# Patient Record
Sex: Male | Born: 1960 | Race: White | Hispanic: No | Marital: Single | Smoking: Never smoker
Health system: Southern US, Community
[De-identification: ages and names within clinical notes are randomized; demographics above are authoritative.]

## PROBLEM LIST (undated history)

## (undated) DIAGNOSIS — M1990 Unspecified osteoarthritis, unspecified site: Secondary | ICD-10-CM

## (undated) DIAGNOSIS — I82409 Acute embolism and thrombosis of unspecified deep veins of unspecified lower extremity: Secondary | ICD-10-CM

## (undated) DIAGNOSIS — I2699 Other pulmonary embolism without acute cor pulmonale: Secondary | ICD-10-CM

## (undated) DIAGNOSIS — L97409 Non-pressure chronic ulcer of unspecified heel and midfoot with unspecified severity: Secondary | ICD-10-CM

## (undated) DIAGNOSIS — L02612 Cutaneous abscess of left foot: Secondary | ICD-10-CM

## (undated) DIAGNOSIS — L723 Sebaceous cyst: Secondary | ICD-10-CM

## (undated) DIAGNOSIS — E11621 Type 2 diabetes mellitus with foot ulcer: Secondary | ICD-10-CM

## (undated) HISTORY — PX: ROTATOR CUFF REPAIR: SHX139

## (undated) HISTORY — DX: Other pulmonary embolism without acute cor pulmonale: I26.99

## (undated) HISTORY — DX: Cutaneous abscess of left foot: L02.612

## (undated) HISTORY — DX: Sebaceous cyst: L72.3

## (undated) HISTORY — PX: JOINT REPLACEMENT: SHX530

---
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
## 1997-09-15 DIAGNOSIS — E1165 Type 2 diabetes mellitus with hyperglycemia: Secondary | ICD-10-CM

## 1997-09-15 DIAGNOSIS — E118 Type 2 diabetes mellitus with unspecified complications: Secondary | ICD-10-CM

## 2000-10-25 ENCOUNTER — Emergency Department (HOSPITAL_COMMUNITY): Admission: EM | Admit: 2000-10-25 | Discharge: 2000-10-25 | Payer: Self-pay | Admitting: Emergency Medicine

## 2000-10-25 ENCOUNTER — Encounter: Payer: Self-pay | Admitting: Emergency Medicine

## 2003-07-30 ENCOUNTER — Inpatient Hospital Stay (HOSPITAL_COMMUNITY): Admission: RE | Admit: 2003-07-30 | Discharge: 2003-08-03 | Payer: Self-pay | Admitting: Orthopedic Surgery

## 2011-08-10 ENCOUNTER — Ambulatory Visit (INDEPENDENT_AMBULATORY_CARE_PROVIDER_SITE_OTHER): Payer: Commercial Indemnity | Admitting: Surgery

## 2011-08-10 ENCOUNTER — Encounter (INDEPENDENT_AMBULATORY_CARE_PROVIDER_SITE_OTHER): Payer: Self-pay | Admitting: Surgery

## 2011-08-10 VITALS — BP 118/76 | HR 72 | Temp 97.3°F | Resp 18 | Ht 72.0 in | Wt 217.6 lb

## 2011-08-10 DIAGNOSIS — L723 Sebaceous cyst: Secondary | ICD-10-CM

## 2011-08-10 DIAGNOSIS — L089 Local infection of the skin and subcutaneous tissue, unspecified: Secondary | ICD-10-CM

## 2011-08-10 NOTE — Progress Notes (Signed)
Subjective:     Patient ID: Gregory May, male   DOB: July 13, 1961, 50 y.o.   MRN: [id number]  HPI  This is a gentleman referred by Dr. Lucianne Muss for evaluation of an infected sebaceous cyst. He has had this apparently drained in the past. It most notably flared up over the past week. He was started on doxycycline yesterday. It has drained some period he.He is a diabetic. Review of Systems     Objective:   Physical Exam On exam, there is an infected sebaceous cyst on the back of his neck. I prepped the area with Betadine. I then anesthetized the lidocaine. I made an incision in the neck and drained a very large amount of purulence as well as sebaceous debris. I then packed the wound gauze.    Assessment:     Infected sebaceous cyst status post incision and drainage    Plan:     Wound care instructions were given. He does not think he can diagnose himself. He remove the packing tomorrow. He will continue doxycycline. I wrote him for hydrocodone. I will have him see someone urgent office next Thursday or Friday

## 2011-08-16 ENCOUNTER — Ambulatory Visit (INDEPENDENT_AMBULATORY_CARE_PROVIDER_SITE_OTHER): Payer: Commercial Indemnity | Admitting: General Surgery

## 2011-08-16 ENCOUNTER — Encounter (INDEPENDENT_AMBULATORY_CARE_PROVIDER_SITE_OTHER): Payer: Self-pay | Admitting: General Surgery

## 2011-08-16 VITALS — BP 122/77 | HR 66 | Temp 97.1°F | Resp 12 | Ht 72.0 in | Wt 223.0 lb

## 2011-08-16 DIAGNOSIS — L723 Sebaceous cyst: Secondary | ICD-10-CM | POA: Insufficient documentation

## 2011-08-16 NOTE — Patient Instructions (Signed)
Complete your antibiotic prescription. Cover with a band-aid daily.

## 2011-08-16 NOTE — Progress Notes (Signed)
Subjective:     Patient ID: Gregory May, male   DOB: 27-Nov-1960, 51 y.o.   MRN: [id number]  HPI Patient underwent incision and drainage of sebaceous cyst by Dr. Rayburn Ma 6 days ago. He's been doing local wound care. They've been changing the packing daily. He removed the packing earlier today and placed a Band-Aid. He's feeling better.  Review of Systems     Objective:   Physical Exam Wound remains open with clean granulation tissue. The wound was cleaned out. There is active on infection.    Assessment:     Infected sebaceous cyst improving after incision and drainage   Plan:     Complete course of antibiotics. We will have him see Dr. Alicia Amel back to discuss elective excision of this cyst once it is healed. He'll continue wound care with bandaging daily and no further packing.

## 2011-09-03 ENCOUNTER — Encounter (INDEPENDENT_AMBULATORY_CARE_PROVIDER_SITE_OTHER): Payer: Self-pay | Admitting: Surgery

## 2011-09-03 ENCOUNTER — Ambulatory Visit (INDEPENDENT_AMBULATORY_CARE_PROVIDER_SITE_OTHER): Payer: Commercial Indemnity | Admitting: Surgery

## 2011-09-03 VITALS — BP 124/80 | HR 70 | Temp 97.6°F | Resp 14 | Ht 72.0 in | Wt 215.0 lb

## 2011-09-03 DIAGNOSIS — L723 Sebaceous cyst: Secondary | ICD-10-CM

## 2011-09-03 NOTE — Progress Notes (Signed)
Subjective:     Patient ID: Gregory May, male   DOB: 04/09/1961, 51 y.o.   MRN: [id number]  HPI He is here for another followup visit. He reports that the site no longer gives him any discomfort. He denies any drainage.  Review of Systems     Objective:   Physical Exam On exam, the incision is well-healed without evidence of infection.    Assessment:     Resolved infected sebaceous cyst    Plan:     As this is his second recurrence, we will proceed eventually with removal of this area and operating room as well as removal of other sebaceous cysts. He will come back and see me later in the year to decide when he wants to  do this. He will call me should any erythema develop

## 2013-06-23 LAB — HM DIABETES EYE EXAM

## 2013-07-08 ENCOUNTER — Encounter: Payer: Self-pay | Admitting: Endocrinology

## 2013-07-17 ENCOUNTER — Encounter: Payer: Self-pay | Admitting: Endocrinology

## 2013-09-15 ENCOUNTER — Encounter (HOSPITAL_COMMUNITY): Payer: Self-pay | Admitting: Emergency Medicine

## 2013-09-15 ENCOUNTER — Emergency Department (HOSPITAL_COMMUNITY): Payer: Managed Care, Other (non HMO)

## 2013-09-15 ENCOUNTER — Inpatient Hospital Stay (HOSPITAL_COMMUNITY)
Admission: EM | Admit: 2013-09-15 | Discharge: 2013-09-21 | DRG: 593 | Disposition: A | Discharge status: Home / Self Care | Payer: Managed Care, Other (non HMO) | Attending: Internal Medicine | Admitting: Internal Medicine

## 2013-09-15 DIAGNOSIS — D72829 Elevated white blood cell count, unspecified: Secondary | ICD-10-CM | POA: Diagnosis present

## 2013-09-15 DIAGNOSIS — L089 Local infection of the skin and subcutaneous tissue, unspecified: Secondary | ICD-10-CM

## 2013-09-15 DIAGNOSIS — L723 Sebaceous cyst: Secondary | ICD-10-CM

## 2013-09-15 DIAGNOSIS — E118 Type 2 diabetes mellitus with unspecified complications: Secondary | ICD-10-CM

## 2013-09-15 DIAGNOSIS — L0390 Cellulitis, unspecified: Secondary | ICD-10-CM

## 2013-09-15 DIAGNOSIS — L03119 Cellulitis of unspecified part of limb: Secondary | ICD-10-CM

## 2013-09-15 DIAGNOSIS — E1169 Type 2 diabetes mellitus with other specified complication: Secondary | ICD-10-CM

## 2013-09-15 DIAGNOSIS — L0291 Cutaneous abscess, unspecified: Secondary | ICD-10-CM

## 2013-09-15 DIAGNOSIS — L02619 Cutaneous abscess of unspecified foot: Secondary | ICD-10-CM | POA: Diagnosis present

## 2013-09-15 DIAGNOSIS — E1149 Type 2 diabetes mellitus with other diabetic neurological complication: Secondary | ICD-10-CM | POA: Diagnosis present

## 2013-09-15 DIAGNOSIS — E1142 Type 2 diabetes mellitus with diabetic polyneuropathy: Secondary | ICD-10-CM | POA: Diagnosis present

## 2013-09-15 DIAGNOSIS — Z91199 Patient's noncompliance with other medical treatment and regimen due to unspecified reason: Secondary | ICD-10-CM

## 2013-09-15 DIAGNOSIS — E1165 Type 2 diabetes mellitus with hyperglycemia: Secondary | ICD-10-CM | POA: Diagnosis present

## 2013-09-15 DIAGNOSIS — Z791 Long term (current) use of non-steroidal anti-inflammatories (NSAID): Secondary | ICD-10-CM

## 2013-09-15 DIAGNOSIS — E11621 Type 2 diabetes mellitus with foot ulcer: Secondary | ICD-10-CM | POA: Diagnosis present

## 2013-09-15 DIAGNOSIS — E876 Hypokalemia: Secondary | ICD-10-CM | POA: Diagnosis present

## 2013-09-15 DIAGNOSIS — L97409 Non-pressure chronic ulcer of unspecified heel and midfoot with unspecified severity: Principal | ICD-10-CM | POA: Diagnosis present

## 2013-09-15 DIAGNOSIS — M79609 Pain in unspecified limb: Secondary | ICD-10-CM | POA: Diagnosis present

## 2013-09-15 DIAGNOSIS — Z9119 Patient's noncompliance with other medical treatment and regimen: Secondary | ICD-10-CM

## 2013-09-15 DIAGNOSIS — Z96649 Presence of unspecified artificial hip joint: Secondary | ICD-10-CM

## 2013-09-15 DIAGNOSIS — IMO0002 Reserved for concepts with insufficient information to code with codable children: Secondary | ICD-10-CM | POA: Diagnosis present

## 2013-09-15 DIAGNOSIS — L97509 Non-pressure chronic ulcer of other part of unspecified foot with unspecified severity: Secondary | ICD-10-CM

## 2013-09-15 LAB — BASIC METABOLIC PANEL
BUN: 12 mg/dL (ref 6–23)
CO2: 26 mEq/L (ref 19–32)
Calcium: 9 mg/dL (ref 8.4–10.5)
Chloride: 95 mEq/L — ABNORMAL LOW (ref 96–112)
Creatinine, Ser: 0.65 mg/dL (ref 0.50–1.35)
GFR calc Af Amer: >90 mL/min (ref >90)
GFR calc non Af Amer: >90 mL/min (ref >90)
Glucose, Bld: 275 mg/dL — ABNORMAL HIGH (ref 70–99)
Potassium: 4.1 mEq/L (ref 3.7–5.3)
Sodium: 134 mEq/L — ABNORMAL LOW (ref 137–147)

## 2013-09-15 LAB — CBC WITH DIFFERENTIAL/PLATELET
Basophils Absolute: 0 10*3/uL (ref 0.0–0.1)
Basophils Relative: 0 % (ref 0–1)
Eosinophils Absolute: 0.1 10*3/uL (ref 0.0–0.7)
Eosinophils Relative: 1 % (ref 0–5)
HCT: 44.3 % (ref 39.0–52.0)
Hemoglobin: 15.5 g/dL (ref 13.0–17.0)
Lymphocytes Relative: 19 % (ref 12–46)
Lymphs Abs: 2.4 10*3/uL (ref 0.7–4.0)
MCH: 31.9 pg (ref 26.0–34.0)
MCHC: 35 g/dL (ref 30.0–36.0)
MCV: 91.2 fL (ref 78.0–100.0)
Monocytes Absolute: 1.3 10*3/uL — ABNORMAL HIGH (ref 0.1–1.0)
Monocytes Relative: 11 % (ref 3–12)
Neutro Abs: 8.7 10*3/uL — ABNORMAL HIGH (ref 1.7–7.7)
Neutrophils Relative %: 69 % (ref 43–77)
Platelets: 354 10*3/uL (ref 150–400)
RBC: 4.86 MIL/uL (ref 4.22–5.81)
RDW: 12.8 % (ref 11.5–15.5)
WBC: 12.6 10*3/uL — ABNORMAL HIGH (ref 4.0–10.5)

## 2013-09-15 LAB — GLUCOSE, CAPILLARY
Glucose-Capillary: 280 mg/dL — ABNORMAL HIGH (ref 70–99)
Glucose-Capillary: 341 mg/dL — ABNORMAL HIGH (ref 70–99)

## 2013-09-15 MED ORDER — ACETAMINOPHEN 650 MG RE SUPP: 650.0000 mg | Freq: Four times a day (QID) | RECTAL | Status: DC | PRN

## 2013-09-15 MED ORDER — MORPHINE SULFATE 2 MG/ML IJ SOLN
2.0000 mg | INTRAMUSCULAR | Status: DC | PRN
  Administered 2013-09-15 – 2013-09-16 (×2): 2 mg via INTRAVENOUS
  Filled 2013-09-15 (×2): qty 1

## 2013-09-15 MED ORDER — PIPERACILLIN-TAZOBACTAM 3.375 G IVPB
3.3750 g | Freq: Three times a day (TID) | INTRAVENOUS | Status: DC
  Filled 2013-09-15 (×2): qty 50

## 2013-09-15 MED ORDER — SODIUM CHLORIDE 0.9 % IV SOLN
INTRAVENOUS | Status: DC
  Administered 2013-09-15 – 2013-09-19 (×4): via INTRAVENOUS

## 2013-09-15 MED ORDER — ALUM & MAG HYDROXIDE-SIMETH 200-200-20 MG/5ML PO SUSP: 30.0000 mL | Freq: Four times a day (QID) | ORAL | Status: DC | PRN

## 2013-09-15 MED ORDER — INSULIN ASPART 100 UNIT/ML ~~LOC~~ SOLN
0.0000 [IU] | Freq: Every day | SUBCUTANEOUS | Status: DC
  Administered 2013-09-16: 3 [IU] via SUBCUTANEOUS
  Administered 2013-09-16: 4 [IU] via SUBCUTANEOUS

## 2013-09-15 MED ORDER — INSULIN ASPART 100 UNIT/ML ~~LOC~~ SOLN
0.0000 [IU] | Freq: Three times a day (TID) | SUBCUTANEOUS | Status: DC
  Administered 2013-09-16: 8 [IU] via SUBCUTANEOUS
  Administered 2013-09-16: 3 [IU] via SUBCUTANEOUS
  Administered 2013-09-16: 8 [IU] via SUBCUTANEOUS
  Administered 2013-09-17: 3 [IU] via SUBCUTANEOUS
  Administered 2013-09-17: 8 [IU] via SUBCUTANEOUS

## 2013-09-15 MED ORDER — SORBITOL 70 % SOLN
30.0000 mL | Freq: Every day | Status: DC | PRN
  Filled 2013-09-15: qty 30

## 2013-09-15 MED ORDER — PIPERACILLIN-TAZOBACTAM 3.375 G IVPB
3.3750 g | Freq: Three times a day (TID) | INTRAVENOUS | Status: DC
  Administered 2013-09-16 – 2013-09-19 (×11): 3.375 g via INTRAVENOUS
  Filled 2013-09-15 (×12): qty 50

## 2013-09-15 MED ORDER — ONDANSETRON HCL 4 MG/2ML IJ SOLN
4.0000 mg | Freq: Four times a day (QID) | INTRAMUSCULAR | Status: DC | PRN
Start: 2013-09-15 — End: 2013-09-21

## 2013-09-15 MED ORDER — INSULIN GLARGINE 100 UNIT/ML ~~LOC~~ SOLN
12.0000 [IU] | Freq: Every day | SUBCUTANEOUS | Status: DC
  Administered 2013-09-16: 12 [IU] via SUBCUTANEOUS
  Filled 2013-09-15 (×2): qty 0.12

## 2013-09-15 MED ORDER — PIPERACILLIN-TAZOBACTAM 3.375 G IVPB 30 MIN
3.3750 g | Freq: Once | INTRAVENOUS | Status: AC
  Administered 2013-09-15: 3.375 g via INTRAVENOUS
  Filled 2013-09-15: qty 50

## 2013-09-15 MED ORDER — ONDANSETRON HCL 4 MG PO TABS: 4.0000 mg | ORAL_TABLET | Freq: Four times a day (QID) | ORAL | Status: DC | PRN

## 2013-09-15 MED ORDER — ENOXAPARIN SODIUM 40 MG/0.4ML ~~LOC~~ SOLN
40.0000 mg | Freq: Every day | SUBCUTANEOUS | Status: DC
  Administered 2013-09-16 – 2013-09-20 (×6): 40 mg via SUBCUTANEOUS
  Filled 2013-09-15 (×7): qty 0.4

## 2013-09-15 MED ORDER — OXYCODONE HCL 5 MG PO TABS
5.0000 mg | ORAL_TABLET | ORAL | Status: DC | PRN
  Administered 2013-09-16 – 2013-09-21 (×11): 5 mg via ORAL
  Filled 2013-09-15 (×11): qty 1

## 2013-09-15 MED ORDER — VANCOMYCIN HCL 10 G IV SOLR
1500.0000 mg | Freq: Two times a day (BID) | INTRAVENOUS | Status: DC
  Administered 2013-09-16 – 2013-09-17 (×4): 1500 mg via INTRAVENOUS
  Filled 2013-09-15 (×5): qty 1500

## 2013-09-15 MED ORDER — ACETAMINOPHEN 325 MG PO TABS: 650.0000 mg | ORAL_TABLET | Freq: Four times a day (QID) | ORAL | Status: DC | PRN

## 2013-09-15 NOTE — ED Provider Notes (Signed)
Medical screening examination/treatment/procedure(s) were conducted as a shared visit with non-physician practitioner(s) and myself.  I personally evaluated the patient during the encounter.  EKG Interpretation   None        Toy BakerAnthony T Willow Reczek, MD 09/15/13 2115

## 2013-09-15 NOTE — ED Notes (Signed)
Pt states that he has been feeling a calus on the bottom of his R foot since November but it started hurting approx. 3 weeks ago. Family member noticed it had a wound under the calus.

## 2013-09-15 NOTE — Progress Notes (Signed)
ANTIBIOTIC CONSULT NOTE - INITIAL  Pharmacy Consult for Zosyn Indication: diabetic foot ulcer  No Known Allergies  Patient Measurements: Height: 5\' 11"  (180.3 cm) Weight: 195 lb (88.451 kg) IBW/kg (Calculated) : 75.3   Vital Signs: Temp: 98.3 F (36.8 C) (02/03 1800) Temp src: Oral (02/03 1800) BP: 134/87 mmHg (02/03 1800) Pulse Rate: 99 (02/03 1800)  Labs:  Recent Labs  09/15/13 2000  WBC 12.6*  HGB 15.5  PLT 354  CREATININE 0.65   Estimated Creatinine Clearance: 117.7 ml/min (by C-G formula based on Cr of 0.65).   Medical History: Past Medical History  Diagnosis Date  . Sebaceous cyst     on back of neck  . Diabetes mellitus     type II    Medications:  Scheduled:   Infusions:  . piperacillin-tazobactam     Assessment: 52 yoM admitted 2/3 with diabetic foot ulcer on R foot. Pt reported callus on his foot since November that has been hurting x 3 weeks, family member noticed ulcer below callus. Pt is unable to check his own feet d/t hip surgery. Foot Xray today does not show bony involvement. Pharmacy has been consulted to dose Zosyn for diabetic foot infection.  Antiinfectives 2/3 >> Zosyn >>  Tmax: afebrile WBCs: elevated to 12.6k Renal: SCr 0.65, CrCl >100 ml/min CG and normalized  Microbiology No cultures have been drawn this admission    Goal of Therapy:  erdication of infection  Plan:  - Zosyn 3.375G IV q8h to be infused over 4 hours - follow-up clinical course, culture results, renal function - follow-up antibiotic de-escalation and length of therapy  Thank you for the consult.  Tomi BambergerJesse Carmichael Burdette, PharmD, BCPS Pager: [phone] Pharmacy: [phone] 09/15/2013 8:50 PM

## 2013-09-15 NOTE — ED Provider Notes (Signed)
CSN: [id number]     Arrival date & time 09/15/13  1738 History   First MD Initiated Contact with Patient 09/15/13 1922     Chief Complaint  Patient presents with  . Wound Check   (Consider location/radiation/quality/duration/timing/severity/associated sxs/prior Treatment) HPI Comments: Patient presents to the emergency department with chief complaints of right foot pain. Patient has been complaining of right foot pain since November. He states that he recently started hurting worse about 3 weeks ago. He states that he has been unable to look at his foot secondary to hip surgery. He states a family member thought that he had an infection, so he came to the emergency department. He denies fevers or chills. He is diabetic. No known allergies.  The history is provided by the patient. No language interpreter was used.    Past Medical History  Diagnosis Date  . Sebaceous cyst     on back of neck  . Diabetes mellitus     type II   Past Surgical History  Procedure Laterality Date  . Rotator cuff repair  2005 (approx)    right   . Closed reduction with humer pin insertion  1974    left hip  . Joint replacement  2006 (approx)    right hip replaced   Family History  Problem Relation Age of Onset  . Cancer Mother     breast, colon, liver   History  Substance Use Topics  . Smoking status: Never Smoker   . Smokeless tobacco: Never Used  . Alcohol Use: Yes     Comment: occasional    Review of Systems  All other systems reviewed and are negative.    Allergies  Review of patient's allergies indicates no known allergies.  Home Medications   Current Outpatient Rx  Name  Route  Sig  Dispense  Refill  . ibuprofen (ADVIL,MOTRIN) 200 MG tablet   Oral   Take 400 mg by mouth every 6 (six) hours as needed (pain).          BP 134/87  Pulse 99  Temp(Src) 98.3 F (36.8 C) (Oral)  Ht 5\' 11"  (1.803 m)  Wt 195 lb (88.451 kg)  BMI 27.21 kg/m2  SpO2 98% Physical Exam  Nursing note  and vitals reviewed. Constitutional: He is oriented to person, place, and time. He appears well-developed and well-nourished.  HENT:  Head: Normocephalic and atraumatic.  Eyes: Conjunctivae and EOM are normal. Pupils are equal, round, and reactive to light. Right eye exhibits no discharge. Left eye exhibits no discharge. No scleral icterus.  Neck: Normal range of motion. Neck supple. No JVD present.  Cardiovascular: Normal rate, regular rhythm and normal heart sounds.  Exam reveals no gallop and no friction rub.   No murmur heard. Pulmonary/Chest: Effort normal and breath sounds normal. No respiratory distress. He has no wheezes. He has no rales. He exhibits no tenderness.  Abdominal: Soft. He exhibits no distension and no mass. There is no tenderness. There is no rebound and no guarding.  Musculoskeletal: Normal range of motion. He exhibits no edema and no tenderness.  Neurological: He is alert and oriented to person, place, and time.  Skin: Skin is warm and dry.  Stage I ulcer to the right heel with surrounding erythema  Psychiatric: He has a normal mood and affect. His behavior is normal. Judgment and thought content normal.    ED Course  Procedures (including critical care time) Results for orders placed during the hospital encounter of 09/15/13  GLUCOSE, CAPILLARY      Result Value Range   Glucose-Capillary 280 (*) 70 - 99 mg/dL   Comment 1 Documented in Chart     Comment 2 Notify RN    CBC WITH DIFFERENTIAL      Result Value Range   WBC 12.6 (*) 4.0 - 10.5 K/uL   RBC 4.86  4.22 - 5.81 MIL/uL   Hemoglobin 15.5  13.0 - 17.0 g/dL   HCT 41.344.3  24.439.0 - 01.052.0 %   MCV 91.2  78.0 - 100.0 fL   MCH 31.9  26.0 - 34.0 pg   MCHC 35.0  30.0 - 36.0 g/dL   RDW 27.212.8  53.611.5 - 64.415.5 %   Platelets 354  150 - 400 K/uL   Neutrophils Relative % 69  43 - 77 %   Neutro Abs 8.7 (*) 1.7 - 7.7 K/uL   Lymphocytes Relative 19  12 - 46 %   Lymphs Abs 2.4  0.7 - 4.0 K/uL   Monocytes Relative 11  3 - 12 %    Monocytes Absolute 1.3 (*) 0.1 - 1.0 K/uL   Eosinophils Relative 1  0 - 5 %   Eosinophils Absolute 0.1  0.0 - 0.7 K/uL   Basophils Relative 0  0 - 1 %   Basophils Absolute 0.0  0.0 - 0.1 K/uL  BASIC METABOLIC PANEL      Result Value Range   Sodium 134 (*) 137 - 147 mEq/L   Potassium 4.1  3.7 - 5.3 mEq/L   Chloride 95 (*) 96 - 112 mEq/L   CO2 26  19 - 32 mEq/L   Glucose, Bld 275 (*) 70 - 99 mg/dL   BUN 12  6 - 23 mg/dL   Creatinine, Ser 0.340.65  0.50 - 1.35 mg/dL   Calcium 9.0  8.4 - 74.210.5 mg/dL   GFR calc non Af Amer >90  >90 mL/min   GFR calc Af Amer >90  >90 mL/min   Dg Foot Complete Right  09/15/2013   CLINICAL DATA:  Wound on bottom of foot, foot pain.  EXAM: RIGHT FOOT COMPLETE - 3+ VIEW  COMPARISON:  None.  FINDINGS: No acute bony abnormality. Specifically, no fracture, subluxation, or dislocation. Soft tissues are intact. No radiographic changes of osteomyelitis. Vascular calcifications noted.  IMPRESSION: No acute bony abnormality.   Electronically Signed   By: Charlett NoseKevin  Dover M.D.   On: 09/15/2013 20:17     EKG Interpretation   None       MDM   1. Cellulitis     Patient with diabetic foot infection. Plain films negative. No fever. Does not appear to be septic. No evidence of septic joint. Will admit to the hospitalist, this patient does not have followup. He also has new cellulitis around the ulcer on his heel. Patient seen by and discussed with Dr. Freida BusmanAllen, who agrees with the plan.    Roxy Horsemanobert Avry Monteleone, PA-C 09/15/13 2056

## 2013-09-15 NOTE — Progress Notes (Signed)
ANTIBIOTIC CONSULT NOTE - INITIAL  Pharmacy Consult for Zosyn Indication: diabetic foot ulcer  No Known Allergies  Patient Measurements: Height: 5\' 11"  (180.3 cm) Weight: 195 lb (88.451 kg) IBW/kg (Calculated) : 75.3   Vital Signs: Temp: 97.4 F (36.3 C) (02/03 2256) Temp src: Oral (02/03 2256) BP: 153/91 mmHg (02/03 2256) Pulse Rate: 78 (02/03 2256)  Labs:  Recent Labs  09/15/13 2000  WBC 12.6*  HGB 15.5  PLT 354  CREATININE 0.65   Estimated Creatinine Clearance: 117.7 ml/min (by C-G formula based on Cr of 0.65).   Medical History: Past Medical History  Diagnosis Date  . Sebaceous cyst     on back of neck  . Diabetes mellitus     type II    Medications:  Scheduled:  . enoxaparin (LOVENOX) injection  40 mg Subcutaneous QHS  . [START ON 09/16/2013] insulin aspart  0-15 Units Subcutaneous TID WC  . insulin aspart  0-5 Units Subcutaneous QHS  . insulin glargine  12 Units Subcutaneous QHS  . [START ON 09/16/2013] piperacillin-tazobactam (ZOSYN)  IV  3.375 g Intravenous Q8H  . vancomycin  1,500 mg Intravenous Q12H   Infusions:  . sodium chloride 125 mL/hr at 09/15/13 2241   Assessment: 52 yoM admitted 2/3 with diabetic foot ulcer on R foot. Pt reported callus on his foot since November that has been hurting x 3 weeks, family member noticed ulcer below callus. Pt is unable to check his own feet d/t hip surgery. Foot Xray today does not show bony involvement. Pharmacy has been consulted to dose Vancomycin for diabetic foot infection.  Antiinfectives 2/3 >> Zosyn >>  Tmax: afebrile WBCs: elevated to 12.6k Renal: SCr 0.65, CrCl >100 ml/min CG and normalized  Microbiology No cultures have been drawn this admission    Goal of Therapy:  VT= 10-15 mg/l cellulitis (neg for osteo)  Plan:   Vancomycin 1500mg  IV q12h   follow-up clinical course, culture results, renal function  -follow-up antibiotic de-escalation and length of therapy     Lorenza EvangelistGreen, Ziomara Birenbaum  [facility] Pharmacy: [phone] 09/15/2013 11:29 PM

## 2013-09-15 NOTE — H&P (Signed)
[facility] History and Physical  Terrance Usery ZOX:[id number] DOB: Jun 29, 1961 DOA: 09/15/2013  Referring physician:  PCP: Provider Not In System   Chief Complaint: Right heel ulcer  HPI: Gregory May is a 53 y.o. male with a past medical history of poorly controlled diabetes mellitus, medication nonadherence, having been off of his insulin for the past year presents to the emergency room with complaints of right heel pain. He states feeling a "knot" to his right heel back in November. He was unable to inspect his foot didn't to difficulties crossing his leg, assuming it was secondary to his broots. He does not actually experience pain until 3 weeks ago at which time he has noted gradual erythema developing in the lateral aspect of his right heel. His pain has significantly increased in the past 24 hours as he is now unable to bear weight on his foot. He endorses chills though denies fevers, nausea, vomiting, fatigue, malaise, confusion, chest pain, shortness of breath, abdominal pain. Prior to this presentation he has not been on antimicrobial therapy. He was administered vancomycin and Zosyn in the emergency room. Three-view of right foot performed in the ED did not reveal evidence of osteomyelitis.                                                                                                                                                                                                                                                                        Review of Systems:  Constitutional:  No weight loss, night sweats, Fevers, fatigue, positive for chills HEENT:  No headaches, Difficulty swallowing,Tooth/dental problems,Sore throat,  No sneezing, itching, ear ache, nasal congestion, post nasal drip,  Cardio-vascular:  No chest pain, Orthopnea, PND, swelling in lower extremities, anasarca, dizziness, palpitations  GI:  No heartburn, indigestion, abdominal pain, nausea, vomiting,  diarrhea, change in bowel habits, loss of appetite  Resp:  No shortness of breath with exertion or at rest. No excess mucus, no productive cough, No non-productive cough, No coughing up of blood.No change in color of mucus.No wheezing.No chest wall deformity  Skin:  He reports erythema to his right heel  GU:  no dysuria, change in color of urine, no urgency or frequency. No flank pain.  Musculoskeletal:  No joint pain or swelling. No decreased range of motion. No back pain.  Psych:  No change in mood or affect. No depression or anxiety. No memory loss.   Past Medical History  Diagnosis Date  . Sebaceous cyst     on back of neck  . Diabetes mellitus     type II   Past Surgical History  Procedure Laterality Date  . Rotator cuff repair  2005 (approx)    right   . Closed reduction with humer pin insertion  1974    left hip  . Joint replacement  2006 (approx)    right hip replaced   Social History:  reports that he has never smoked. He has never used smokeless tobacco. He reports that he drinks alcohol. He reports that he does not use illicit drugs.  No Known Allergies  Family History  Problem Relation Age of Onset  . Cancer Mother     breast, colon, liver     Prior to Admission medications   Medication Sig Start Date End Date Taking? Authorizing Provider  ibuprofen (ADVIL,MOTRIN) 200 MG tablet Take 400 mg by mouth every 6 (six) hours as needed (pain).   Yes Historical Provider, MD   Physical Exam: Filed Vitals:   09/15/13 2111  BP: 113/59  Pulse: 92  Temp:   Resp: 18    BP 113/59  Pulse 92  Temp(Src) 98.3 F (36.8 C) (Oral)  Resp 18  Ht 5\' 11"  (1.803 m)  Wt 88.451 kg (195 lb)  BMI 27.21 kg/m2  SpO2 94%  General:  Appears calm and comfortable Eyes: PERRL, normal lids, irises & conjunctiva ENT: grossly normal hearing, lips & tongue Neck: no LAD, masses or thyromegaly Cardiovascular: RRR, no m/r/g. No LE edema. Telemetry: SR, no arrhythmias  Respiratory:  CTA bilaterally, no w/r/r. Normal respiratory effort. Abdomen: soft, ntnd Skin: He has a 2-3 cm dry ulcer to the heel of his right foot, with a surrounding erythema. I did not note fluctuance nor could purulence be expressed. The area is tender to palpation Musculoskeletal: grossly normal tone BUE/BLE Psychiatric: grossly normal mood and affect, speech fluent and appropriate Neurologic: grossly non-focal.          Labs on Admission:  Basic Metabolic Panel:  Recent Labs Lab 09/15/13 2000  NA 134*  K 4.1  CL 95*  CO2 26  GLUCOSE 275*  BUN 12  CREATININE 0.65  CALCIUM 9.0   Liver Function Tests: No results found for this basename: AST, ALT, ALKPHOS, BILITOT, PROT, ALBUMIN,  in the last 168 hours No results found for this basename: LIPASE, AMYLASE,  in the last 168 hours No results found for this basename: AMMONIA,  in the last 168 hours CBC:  Recent Labs Lab 09/15/13 2000  WBC 12.6*  NEUTROABS 8.7*  HGB 15.5  HCT 44.3  MCV 91.2  PLT 354   Cardiac Enzymes: No results found for this basename: CKTOTAL, CKMB, CKMBINDEX, TROPONINI,  in the last 168 hours  BNP (last 3 results) No results found for this basename: PROBNP,  in the last 8760 hours CBG:  Recent Labs Lab 09/15/13 1814  GLUCAP 280*    Radiological Exams on Admission: Dg Foot Complete Right  09/15/2013   CLINICAL DATA:  Wound on bottom of foot, foot pain.  EXAM: RIGHT FOOT COMPLETE - 3+ VIEW  COMPARISON:  None.  FINDINGS: No acute bony abnormality. Specifically, no fracture, subluxation, or dislocation. Soft tissues are intact. No radiographic changes of osteomyelitis. Vascular calcifications  noted.  IMPRESSION: No acute bony abnormality.   Electronically Signed   By: Charlett NoseKevin  Dover M.D.   On: 09/15/2013 20:17    EKG: Independently reviewed.   Assessment/Plan Active Problems:   Diabetic foot ulcer associated with type 2 diabetes mellitus   Poorly controlled type 2 diabetes mellitus with complication    Cellulitis   Diabetic foot ulcer   1. Diabetic foot ulcer. Patient having a history of poorly controlled diabetes mellitus, off of his insulin for the past year, presenting to the emergency room with heel pain. Findings revealed dry ulcer with surrounding erythema. X-ray of his foot performed in the ER did not reveal evidence of osteomyelitis. We'll continue broad-spectrum IV antibiotic therapy with vancomycin and Zosyn. Consult wound care. Followup on blood cultures, provide supportive care, diabetes management. 2. Type 2 diabetes mellitus poorly controlled with complication. Patient presenting with diabetic ulcer. Will restart his Lantus at 12 units subcutaneous daily, provide sliding scale coverage with Accu-Cheks q. a.c. each bedtime and obtain a hemoglobin A1c. Consult diabetic coordinator.  3. DVT prophylaxis. Lovenox    Code Status: Full code Disposition Plan: Will admit patient to inpatient service I suspect will require 2 nights hospitalization  Time spent: 55 minutes  Jeralyn BennettZAMORA, Daquana Paddock [facility] Pager [phone]

## 2013-09-15 NOTE — ED Provider Notes (Signed)
Medical screening examination/treatment/procedure(s) were conducted as a shared visit with non-physician practitioner(s) and myself.  I personally evaluated the patient during the encounter.  EKG Interpretation   None      Patient with diabetic foot ulcer on his right heel with cellulitis extending up to the ankle lymphangitic spread. Will start on IV antibiotics and have patient assessed by the hospitalist for admission  Toy BakerAnthony T Kyleen Villatoro, MD 09/15/13 2040

## 2013-09-16 ENCOUNTER — Inpatient Hospital Stay (HOSPITAL_COMMUNITY): Payer: Managed Care, Other (non HMO)

## 2013-09-16 LAB — GLUCOSE, CAPILLARY
Glucose-Capillary: 161 mg/dL — ABNORMAL HIGH (ref 70–99)
Glucose-Capillary: 271 mg/dL — ABNORMAL HIGH (ref 70–99)
Glucose-Capillary: 243 mg/dL — ABNORMAL HIGH (ref 70–99)
Glucose-Capillary: 265 mg/dL — ABNORMAL HIGH (ref 70–99)

## 2013-09-16 LAB — BASIC METABOLIC PANEL
BUN: 10 mg/dL (ref 6–23)
CO2: 28 mEq/L (ref 19–32)
Calcium: 8.2 mg/dL — ABNORMAL LOW (ref 8.4–10.5)
Chloride: 101 mEq/L (ref 96–112)
Creatinine, Ser: 0.65 mg/dL (ref 0.50–1.35)
GFR calc Af Amer: >90 mL/min (ref >90)
GFR calc non Af Amer: >90 mL/min (ref >90)
Glucose, Bld: 163 mg/dL — ABNORMAL HIGH (ref 70–99)
Potassium: 3.5 mEq/L — ABNORMAL LOW (ref 3.7–5.3)
Sodium: 138 mEq/L (ref 137–147)

## 2013-09-16 LAB — CBC
HCT: 40.7 % (ref 39.0–52.0)
Hemoglobin: 13.5 g/dL (ref 13.0–17.0)
MCH: 30.5 pg (ref 26.0–34.0)
MCHC: 33.2 g/dL (ref 30.0–36.0)
MCV: 92.1 fL (ref 78.0–100.0)
Platelets: 312 10*3/uL (ref 150–400)
RBC: 4.42 MIL/uL (ref 4.22–5.81)
RDW: 12.9 % (ref 11.5–15.5)
WBC: 8.6 10*3/uL (ref 4.0–10.5)

## 2013-09-16 LAB — HEMOGLOBIN A1C
Hgb A1c MFr Bld: 12.3 % — ABNORMAL HIGH (ref <5.7)
Mean Plasma Glucose: 306 mg/dL — ABNORMAL HIGH (ref <117)

## 2013-09-16 MED ORDER — POTASSIUM CHLORIDE CRYS ER 20 MEQ PO TBCR
40.0000 meq | EXTENDED_RELEASE_TABLET | Freq: Once | ORAL | Status: AC
  Administered 2013-09-16: 40 meq via ORAL
  Filled 2013-09-16: qty 2

## 2013-09-16 MED ORDER — LIVING WELL WITH DIABETES BOOK
Freq: Once | Status: AC
  Administered 2013-09-16: 11:00:00
  Filled 2013-09-16: qty 1

## 2013-09-16 MED ORDER — INSULIN GLARGINE 100 UNIT/ML ~~LOC~~ SOLN
20.0000 [IU] | Freq: Every day | SUBCUTANEOUS | Status: DC
  Administered 2013-09-16: 20 [IU] via SUBCUTANEOUS
  Filled 2013-09-16 (×3): qty 0.2

## 2013-09-16 MED ORDER — HYDROCERIN EX CREA
TOPICAL_CREAM | Freq: Every day | CUTANEOUS | Status: DC
  Administered 2013-09-16 – 2013-09-20 (×5): via TOPICAL
  Administered 2013-09-21: 1 via TOPICAL
  Filled 2013-09-16: qty 113

## 2013-09-16 MED ORDER — GADOBENATE DIMEGLUMINE 529 MG/ML IV SOLN
18.0000 mL | Freq: Once | INTRAVENOUS | Status: AC | PRN
  Administered 2013-09-16: 18 mL via INTRAVENOUS

## 2013-09-16 MED ORDER — BD GETTING STARTED TAKE HOME KIT: 1/2ML X 30G SYRINGES: 1.0000 | Freq: Once | Status: DC

## 2013-09-16 MED ORDER — BD GETTING STARTED TAKE HOME KIT: 3/10ML X 30G SYRINGES
1.0000 | Freq: Once | Status: AC
  Administered 2013-09-16: 1
  Filled 2013-09-16: qty 1

## 2013-09-16 NOTE — Care Management Note (Unsigned)
    Page 1 of 1   09/16/2013     1:58:43 PM   CARE MANAGEMENT NOTE 09/16/2013  Patient:  Gregory May,Gregory May   Account Number:  [id number]  Date Initiated:  09/16/2013  Documentation initiated by:  [facility],Breann Losano  Subjective/Objective Assessment:   53 year old male admitted with diabetic foot ulcer.     Action/Plan:   From home.   Anticipated DC Date:  09/19/2013   Anticipated DC Plan:  HOME/SELF CARE      DC Planning Services  CM consult      Choice offered to / List presented to:             Status of service:  In process, will continue to follow Medicare Important Message given?  NA - LOS <3 / Initial given by admissions (If response is "NO", the following Medicare IM given date fields will be blank) Date Medicare IM given:   Date Additional Medicare IM given:    Discharge Disposition:    Per UR Regulation:  Reviewed for med. necessity/level of care/duration of stay  If discussed at Long Length of Stay Meetings, dates discussed:    Comments:  09/16/13/ [name] RN BSN Was informed by diabetic educator that pt is interested in insulin pens. I did a benefit check and was informed his copayment is 20% of the retail cost of the insulin pen. Price quotes at [facility] outpatient pharmacy revealev the prices to range $365.97-$403.25. I discussed this with the pt and he stated he cannot afford this and he is willing to learn about drawing up and injecting his insulin. I have asked the bedside RN to begin the teaching. I have also updated the diabetes educator about this.

## 2013-09-16 NOTE — Progress Notes (Signed)
Patient ID: Gregory May, male   DOB: [birth year], 53 y.o.   MRN: [id number] [facility] PROGRESS NOTE  Crew Goren HUD:[id number] DOB: [birth year] DOA: 09/15/2013 PCP: Provider Not In System  Brief narrative: 53 y.o. male with a past medical history of poorly controlled diabetes mellitus, medication nonadherence, having been off of his insulin for the past year presented to [facility] emergency department with several weeks' duration of progressively worsening right heel pain. He describes pain as throbbing and constant, 5/10 in severity. He explains that he has not been able to inspect his feet as he was having difficulty crossing his legs. His friend has seen right heel and told him he has blisterlike warmth on the right heel. Patient explains he noticed some blood coming out of it one day prior to admission but no drainage noted. In the emergency department right foot x-ray did not reveal evidence of osteomyelitis.  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   Antibiotics:  Vancomycin  09/15/2013 -->  Zosyn 09/15/2013 --.  Code Status: Full Family Communication: Pt at bedside Disposition Plan: Home when medically stable  HPI/Subjective: No events overnight.   Objective: Filed Vitals:   09/15/13 2243 09/15/13 2256 09/16/13 0545 09/16/13 0900  BP: 131/74 153/91 127/75 135/81  Pulse: 84 78 73 81  Temp:  97.4 F (36.3 C) 97.9 F (36.6 C) 97.8 F (36.6 C)  TempSrc:  Oral Oral Oral  Resp: _0 Height:      Weight:      SpO2: 95% 93% 96% 95%    Intake/Output Summary (Last 24 hours) at 09/16/13 1155 Last data filed at 09/16/13 0900  Gross per 24 hour  Intake   1579 ml  Output   1300 ml  Net    279 ml    Exam:   General:  Pt is alert, follows commands appropriately, not in acute distress  Cardiovascular: Regular rate and rhythm, S1/S2, no murmurs, no rubs, no gallops  Respiratory: Clear to auscultation bilaterally, no wheezing, no crackles, no rhonchi  Abdomen: Soft, non tender, non distended, bowel sounds present, no guarding  Extremities: No edema, pulses DP and PT palpable bilaterally, right heel ulcer, no open area, no drainage noted, nontender to palpation   Neuro: Grossly nonfocal  Data Reviewed: Basic Metabolic Panel:  Recent Labs Lab 09/15/13 2000 09/16/13 0510  NA 134* 138  K 4.1 3.5*  CL 95* 101  CO2 26 28  GLUCOSE 275* 163*  BUN 12 10  CREATININE 0.65 0.65  CALCIUM 9.0 8.2*   CBC:  Recent Labs Lab 09/15/13 2000 09/16/13 0510  WBC 12.6* 8.6  NEUTROABS 8.7*  --  HGB 15.5 13.5  HCT 44.3 40.7  MCV 91.2 92.1  PLT 354 312   CBG:  Recent Labs Lab 09/15/13 1814 09/15/13 2314 09/16/13 0722 09/16/13 1123  GLUCAP 280* 341* 161* 271*    Scheduled Meds: . bd getting started take home kit  1 kit Other Once  . enoxaparin (LOVENOX) injection  40 mg Subcutaneous QHS  . insulin aspart  0-15 Units Subcutaneous TID WC  . insulin aspart  0-5 Units Subcutaneous QHS  . insulin glargine  12 Units Subcutaneous QHS  .  piperacillin-tazobactam (ZOSYN)  IV  3.375 g Intravenous Q8H  . potassium chloride  40 mEq Oral Once  . vancomycin  1,500 mg Intravenous Q12H   Continuous Infusions: . sodium chloride 125 mL/hr at 09/16/13 0900     Faye Ramsay, MD  [facility] Pager [phone]  If 7PM-7AM, please contact night-coverage [email or web address] Password [email or web address] 09/16/2013, 11:55 AM   LOS: 1 day

## 2013-09-16 NOTE — Progress Notes (Signed)
Patient demonstrated how to draw up insulin from vial, and gave injection to himself.  Patient currently watching diabetes videos on patient education network.  Philomena Dohenyavid Cheyenna Pankowski RN

## 2013-09-16 NOTE — Progress Notes (Signed)
Inpatient Diabetes Program Recommendations  AACE/ADA: New Consensus Statement on Inpatient Glycemic Control (2013)  Target Ranges:  Prepandial:   less than 140 mg/dL      Peak postprandial:   less than 180 mg/dL (1-2 hours)      Critically ill patients:  140 - 180 mg/dL   Reason for Visit: Diabetes Consult - uncontrolled DM  Diabetes history: Type 2 - has been on Victoza in the past Outpatient Diabetes medications: None Current orders for Inpatient glycemic control: Lantus 12 units QHS, Novolog moderate tidwc and hs Pt states he was previously on Victoza, but stopped taking d/t expense. Dr. Dwyane Dee is endo and has not seen in over a year. Does not check blood sugars regularly at home but does have a meter. States he is going to make appt with Dr. Dwyane Dee when he is discharged. Discussed importance of glucose control to prevent long-term complications and explained how high blood sugars are not conducive for healing. Has been to diabetes classes in the past and is willing to go again. Will order Living Well With Diabetes book and encouraged pt to view diabetes videos on pt ed channel. Discussed monitoring at home and importance of taking logbook to MD appt for adjustments in medication. Will order insulin starter kit and RN to begin teaching insulin administration. Pt prefers insulin pen over syringe, and asks what copay would be for his insurance. Will need case management involvement for medication costs. Blood sugars improved this am. Needs meal coverage insulin. Discussed above with Shanon Brow, RN.  Inpatient Diabetes Program Recommendations Insulin - Basal: Increase Lantus to 15 units QHS Insulin - Meal Coverage: Add meal coverage insulin - Novolog 3 units tidwc if pt eats >50% meal HgbA1C: 12.3% - uncontrolled Outpatient Referral: OP Diabetes Education for uncontrolled DM  Note: Will continue to follow while inpatient. Thank you. Lorenda Peck, RD, LDN, CDE Inpatient Diabetes  Coordinator [phone]

## 2013-09-16 NOTE — Consult Note (Signed)
WOC wound consult note Reason for Consult:right heel callous and traumatic injury.  Patient has had callous since November, replaced his work boots in January and recently saw blood in his boot. Wound type:neuropathic ulceration Pressure Ulcer POA: Yes Measurement:3cm x 6cm, of note, 80% of the wound is callus.  20%, located at the most lateral edge is ecchymosis-indicating trauma.  The distal edge of the callus is lifting slightly. Wound NWG:NFAObed:None Drainage (amount, consistency, odor) None.  Patient states he saw blood in his boot. Periwound:intact and as described above. Dressing procedure/placement/frequency: Several suggestions:  We will implement a moisturizing regimen today and continue daily while in house to soften the callous.  Recommend either follow up in an orthopedic or podiatric office for debridement of callous and for provision of an off-loading device so that the tissue can heal.  These service might also be provided at the outpatient [facility].  If you agree, please order consultation/referral.  Off-loading will be critical to the tissue repair as patient works 10-hour days in steel toe boots.WOC nursing team will not follow, but will remain available to this patient, the nursing and medical team.  Please re-consult if needed. Thanks, Ladona MowLaurie Demontay Grantham, MSN, RN, GNP, FredericaWOCN, CWON-AP [phone])

## 2013-09-17 ENCOUNTER — Encounter (HOSPITAL_COMMUNITY)
Admission: EM | Disposition: A | Discharge status: Home / Self Care | Payer: Self-pay | Source: Home / Self Care | Attending: Internal Medicine

## 2013-09-17 DIAGNOSIS — L723 Sebaceous cyst: Secondary | ICD-10-CM

## 2013-09-17 LAB — GLUCOSE, CAPILLARY
Glucose-Capillary: 197 mg/dL — ABNORMAL HIGH (ref 70–99)
Glucose-Capillary: 261 mg/dL — ABNORMAL HIGH (ref 70–99)
Glucose-Capillary: 262 mg/dL — ABNORMAL HIGH (ref 70–99)

## 2013-09-17 LAB — CBC
HCT: 41.6 % (ref 39.0–52.0)
Hemoglobin: 13.8 g/dL (ref 13.0–17.0)
MCH: 30.8 pg (ref 26.0–34.0)
MCHC: 33.2 g/dL (ref 30.0–36.0)
MCV: 92.9 fL (ref 78.0–100.0)
Platelets: 301 10*3/uL (ref 150–400)
RBC: 4.48 MIL/uL (ref 4.22–5.81)
RDW: 12.8 % (ref 11.5–15.5)
WBC: 7.7 10*3/uL (ref 4.0–10.5)

## 2013-09-17 LAB — VANCOMYCIN, TROUGH: Vancomycin Tr: 10.3 ug/mL (ref 10.0–20.0)

## 2013-09-17 LAB — BASIC METABOLIC PANEL
BUN: 11 mg/dL (ref 6–23)
CO2: 26 mEq/L (ref 19–32)
Calcium: 8.6 mg/dL (ref 8.4–10.5)
Chloride: 100 mEq/L (ref 96–112)
Creatinine, Ser: 0.7 mg/dL (ref 0.50–1.35)
GFR calc Af Amer: >90 mL/min (ref >90)
GFR calc non Af Amer: >90 mL/min (ref >90)
Glucose, Bld: 208 mg/dL — ABNORMAL HIGH (ref 70–99)
Potassium: 4 mEq/L (ref 3.7–5.3)
Sodium: 136 mEq/L — ABNORMAL LOW (ref 137–147)

## 2013-09-17 SURGERY — INCISION AND DRAINAGE
Anesthesia: General | Site: Foot | Laterality: Right

## 2013-09-17 MED ORDER — VANCOMYCIN HCL IN DEXTROSE 1-5 GM/200ML-% IV SOLN
1000.0000 mg | Freq: Three times a day (TID) | INTRAVENOUS | Status: DC
  Administered 2013-09-17 – 2013-09-19 (×6): 1000 mg via INTRAVENOUS
  Filled 2013-09-17 (×7): qty 200

## 2013-09-17 MED ORDER — INSULIN ASPART 100 UNIT/ML ~~LOC~~ SOLN
0.0000 [IU] | Freq: Every day | SUBCUTANEOUS | Status: DC
  Administered 2013-09-17 – 2013-09-20 (×3): 2 [IU] via SUBCUTANEOUS

## 2013-09-17 MED ORDER — INSULIN ASPART 100 UNIT/ML ~~LOC~~ SOLN
4.0000 [IU] | Freq: Three times a day (TID) | SUBCUTANEOUS | Status: DC
  Administered 2013-09-17 – 2013-09-21 (×12): 4 [IU] via SUBCUTANEOUS

## 2013-09-17 MED ORDER — INSULIN GLARGINE 100 UNIT/ML ~~LOC~~ SOLN
24.0000 [IU] | Freq: Every day | SUBCUTANEOUS | Status: DC
  Administered 2013-09-17 – 2013-09-18 (×2): 24 [IU] via SUBCUTANEOUS
  Filled 2013-09-17 (×3): qty 0.24

## 2013-09-17 MED ORDER — INSULIN ASPART 100 UNIT/ML ~~LOC~~ SOLN
0.0000 [IU] | Freq: Three times a day (TID) | SUBCUTANEOUS | Status: DC
  Administered 2013-09-17: 8 [IU] via SUBCUTANEOUS
  Administered 2013-09-18: 7 [IU] via SUBCUTANEOUS
  Administered 2013-09-18 (×2): 5 [IU] via SUBCUTANEOUS
  Administered 2013-09-19: 3 [IU] via SUBCUTANEOUS
  Administered 2013-09-19 – 2013-09-20 (×3): 5 [IU] via SUBCUTANEOUS
  Administered 2013-09-20: 2 [IU] via SUBCUTANEOUS
  Administered 2013-09-20 – 2013-09-21 (×2): 3 [IU] via SUBCUTANEOUS

## 2013-09-17 NOTE — Progress Notes (Signed)
Inpatient Diabetes Program Recommendations  AACE/ADA: New Consensus Statement on Inpatient Glycemic Control (2013)  Target Ranges:  Prepandial:   less than 140 mg/dL      Peak postprandial:   less than 180 mg/dL (1-2 hours)      Critically ill patients:  140 - 180 mg/dL   Reason for Visit: Hyperglycemia  Results for Gregory May, Gregory May (MRN [id number]) as of 09/17/2013 13:17  Ref. Range 09/16/2013 11:23 09/16/2013 16:33 09/16/2013 22:23 09/17/2013 07:11 09/17/2013 11:19  Glucose-Capillary Latest Range: 70-99 mg/dL 409271 (H) 811243 (H) 914265 (H) 197 (H) 261 (H)  FBS improving. Post-prandial blood sugars running high.  Please consider addition of Novolog 5 units tidwc for meal coverage insulin. Increase Lantus to 24 units QHS. Continue with inpatient diabetes education.  Pt has given insulin injections and is currently viewing diabetes videos.  Questions answered. Pt again confirmed that he would f/u with Dr. Lucianne MussKumar for endo and [facility]. Discussed with RN. Thank you. Ailene Ardshonda Zynia Wojtowicz, RD, LDN, CDE Inpatient Diabetes Coordinator [phone]

## 2013-09-17 NOTE — Progress Notes (Signed)
Patient ID: Gregory May, male   DOB: 26-Nov-1960, 53 y.o.   MRN: [id number]  [facility] PROGRESS NOTE  Gregory May WUJ:[id number] DOB: 26-Nov-1960 DOA: 09/15/2013 PCP: Provider Not In System  Brief narrative: 53 y.o. male with a past medical history of poorly controlled diabetes mellitus, medication nonadherence, having been off of his insulin for the past year [facility] with several weeks' duration of progressively worsening right heel pain. He describes pain as throbbing and constant, 5/10 in severity. He explains that he has not been able to inspect his feet as he was having difficulty crossing his legs. His friend has seen right heel and told him he has blisterlike warmth on the right heel. Patient explains he noticed some blood coming out of it one day prior to admission but no drainage noted. In the emergency department right foot x-ray did not reveal evidence of osteomyelitis.   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  Mr  Foot Right W Wo Contrast  09/17/2013   Abscess in the subcutaneous fat of the heel with no extension into the bone or plantar fascia. No osteomyelitis Antibiotics:  Vancomycin 09/15/2013 -->  Zosyn 09/15/2013 -->  Code Status: Full  Family Communication: Pt at bedside  Disposition Plan: Home when medically stable  HPI/Subjective: No events overnight.   Objective: Filed Vitals:   09/16/13 2216 09/17/13 0552 09/17/13 1008 09/17/13 1300  BP: 138/83 131/80 111/74 144/88  Pulse: 72 67 80 85  Temp: 97.5 F (36.4 C) 97.7 F (36.5 C) 98 F (36.7 C) 98 F (36.7 C)  TempSrc: Oral Oral Oral Axillary  Resp: 18 18    Height:      Weight:      SpO2: 97% 93% 96% 97%    Intake/Output Summary (Last 24 hours) at 09/17/13 1458 Last data filed at 09/17/13 1400  Gross per 24 hour  Intake   3500 ml  Output   5230 ml  Net  -1730 ml    Exam:   General:  Pt is alert, follows commands appropriately, not in acute distress  Cardiovascular: Regular rate and rhythm, S1/S2, no murmurs, no rubs, no gallops  Respiratory: Clear to auscultation bilaterally, no wheezing, no crackles, no rhonchi  Abdomen: Soft, non tender, non distended, bowel sounds present, no guarding  Extremities: No edema, pulses DP and PT palpable bilaterally, right heel ulcer with drainage, blister opened up, non tender to palpation   Neuro: Grossly nonfocal  Data Reviewed: Basic Metabolic Panel:  Recent Labs Lab 09/15/13 2000 09/16/13 0510 09/17/13 0530  NA 134* 138 136*  K 4.1 3.5* 4.0  CL 95* 101 100  CO2 26 28 26   GLUCOSE 275* 163* 208*  BUN 12 10 11   CREATININE 0.65 0.65 0.70  CALCIUM 9.0 8.2* 8.6   CBC:  Recent Labs Lab 09/15/13 2000 09/16/13 0510 09/17/13 0530  WBC 12.6* 8.6 7.7  NEUTROABS 8.7*  --   --   HGB 15.5 13.5 13.8  HCT 44.3 40.7 41.6  MCV 91.2 92.1 92.9  PLT 354 312 301   CBG:  Recent Labs Lab 09/16/13 1123 09/16/13 1633 09/16/13 2223 09/17/13 0711 09/17/13 1119  GLUCAP  271* 243* 265* 197* 261*    Recent Results (from the past 240 hour(s))  CULTURE, BLOOD (ROUTINE X 2)     Status: None   Collection Time    09/15/13 10:58 PM      Result Value Range Status   Specimen Description BLOOD LEFT ARM   Final   Special Requests BOTTLES DRAWN AEROBIC AND ANAEROBIC 10CC   Final   Culture  Setup Time     Final   Value: 09/16/2013 03:31     Performed at [facility]   Culture     Final   Value:        BLOOD CULTURE RECEIVED NO GROWTH TO DATE CULTURE WILL BE HELD FOR 5 DAYS BEFORE ISSUING A FINAL NEGATIVE REPORT     Performed at [facility]   Report Status PENDING   Incomplete  CULTURE, BLOOD (ROUTINE X 2)     Status: None   Collection Time    09/15/13 11:03 PM      Result Value Range Status   Specimen Description BLOOD LEFT FORARM   Final   Special Requests BOTTLES DRAWN AEROBIC AND ANAEROBIC 8CC    Final   Culture  Setup Time     Final   Value: 09/16/2013 03:30     Performed at [facility]   Culture     Final   Value:        BLOOD CULTURE RECEIVED NO GROWTH TO DATE CULTURE WILL BE HELD FOR 5 DAYS BEFORE ISSUING A FINAL NEGATIVE REPORT     Performed at [facility]   Report Status PENDING   Incomplete     Scheduled Meds: . enoxaparin (LOVENOX) injection  40 mg Subcutaneous QHS  . hydrocerin   Topical Daily  . insulin aspart  0-15 Units Subcutaneous TID WC  . insulin aspart  0-5 Units Subcutaneous QHS  . insulin glargine  20 Units Subcutaneous QHS  . piperacillin-tazobactam (ZOSYN)  IV  3.375 g Intravenous Q8H  . vancomycin  1,000 mg Intravenous Q8H   Continuous Infusions: . sodium chloride 125 mL/hr at 09/16/13 0900   Debbora Presto, MD  [facility] Pager [phone]  If 7PM-7AM, please contact night-coverage [email or web address] Password [email or web address] 09/17/2013, 2:58 PM   LOS: 2 days

## 2013-09-17 NOTE — Progress Notes (Signed)
ANTIBIOTIC CONSULT NOTE - FOLLOW UP  Pharmacy Consult for Vancomycin/Zosyn  Indication: Diabetic foot infection/abscess   No Known Allergies  Patient Measurements: Height: 5\' 11"  (180.3 cm) Weight: 195 lb (88.451 kg) IBW/kg (Calculated) : 75.3   Vital Signs: Temp: 98 F (36.7 C) (02/05 1008) Temp src: Oral (02/05 1008) BP: 111/74 mmHg (02/05 1008) Pulse Rate: 80 (02/05 1008) Intake/Output from previous day: 02/04 0701 - 02/05 0700 In: 4205 [P.O.:480; I.V.:2625; IV Piggyback:1100] Out: 6450 [Urine:6450] Intake/Output from this shift: Total I/O In: 480 [P.O.:480] Out: 680 [Urine:680]  Labs:  Recent Labs  09/15/13 2000 09/16/13 0510 09/17/13 0530  WBC 12.6* 8.6 7.7  HGB 15.5 13.5 13.8  PLT 354 312 301  CREATININE 0.65 0.65 0.70   Estimated Creatinine Clearance: 117.7 ml/min (by C-G formula based on Cr of 0.7).  Recent Labs  09/17/13 1130  VANCOTROUGH 10.3     Microbiology: Recent Results (from the past 720 hour(s))  CULTURE, BLOOD (ROUTINE X 2)     Status: None   Collection Time    09/15/13 10:58 PM      Result Value Range Status   Specimen Description BLOOD LEFT ARM   Final   Special Requests BOTTLES DRAWN AEROBIC AND ANAEROBIC 10CC   Final   Culture  Setup Time     Final   Value: 09/16/2013 03:31     [facility]   Culture     Final   Value:        BLOOD CULTURE RECEIVED NO GROWTH TO DATE CULTURE WILL BE HELD FOR 5 DAYS BEFORE ISSUING A FINAL NEGATIVE REPORT     [facility]formed at [facility]   Report Status PENDING   Incomplete  CULTURE, BLOOD (ROUTINE X 2)     Status: None   Collection Time    09/15/13 11:03 PM      Result Value Range Status   Specimen Description BLOOD LEFT FORARM   Final   Special Requests BOTTLES DRAWN AEROBIC AND ANAEROBIC 8CC    Final   Culture  Setup Time     Final   Value: 09/16/2013 03:30     Performed at [facility]   Culture     Final   Value:        BLOOD CULTURE RECEIVED NO GROWTH  TO DATE CULTURE WILL BE HELD FOR 5 DAYS BEFORE ISSUING A FINAL NEGATIVE REPORT     [facility]formed at [facility]   Report Status PENDING   Incomplete    Anti-infectives   Start     Dose/Rate Route Frequency Ordered Stop   09/17/13 1800  vancomycin (VANCOCIN) IVPB 1000 mg/200 mL premix     1,000 mg 200 mL/hr over 60 Minutes Intravenous Every 8 hours 09/17/13 1316     09/16/13 0600  piperacillin-tazobactam (ZOSYN) IVPB 3.375 g     3.375 g 12.5 mL/hr over 240 Minutes Intravenous 3 times per day 09/15/13 2257     09/16/13 0200  piperacillin-tazobactam (ZOSYN) IVPB 3.375 g  Status:  Discontinued     3.375 g 12.5 mL/hr over 240 Minutes Intravenous Every 8 hours 09/15/13 2051 09/15/13 2257   09/15/13 2359  vancomycin (VANCOCIN) 1,500 mg in sodium chloride 0.9 % 500 mL IVPB  Status:  Discontinued     1,500 mg 250 mL/hr over 120 Minutes Intravenous Every 12 hours 09/15/13 2323 09/17/13 1316   09/15/13 2045  piperacillin-tazobactam (ZOSYN) IVPB 3.375 g     3.375 g 100 mL/hr  over 30 Minutes Intravenous  Once 09/15/13 2042 09/15/13 2140      Assessment:  53 yo M with Diabetic foot infection.   MRI showed abscess.  MRI negative for OM.  Patient on D#2 Vancomycin 1500 mg IV q12h, D#3 Zosyn EI.    Vancomycin trough 10.3, below goal of 15-20 for abscess.  Will increase dose.   Patients renal function stable, with SCr 0.7 and CrCl > 100 ml/min   AF, WBC WNL  2/3 Blood x 2: NGTD  2/3 >> Zosyn >> 2/4 >>Vancomycin >>   Goal of Therapy:  Vancomycin trough level 15-20 mcg/ml Zosyn Per renal function   Plan:  1.) Change Vancomycin to 1 gram IV q8h, anticipate patient will accumulate to goal trough level on this dose, will repeat level at Css 2.) Continue Zosyn 3.375 grams IV Q8h, infuse over 4 hours  3.) Monitor renal function, f/u blood cultures  4.) Repeat VT at Css   Flavio Lindroth, Loma Messing PharmD Pager #: [phone] 1:22 PM 09/17/2013

## 2013-09-18 LAB — GLUCOSE, CAPILLARY
Glucose-Capillary: 224 mg/dL — ABNORMAL HIGH (ref 70–99)
Glucose-Capillary: 182 mg/dL — ABNORMAL HIGH (ref 70–99)
Glucose-Capillary: 213 mg/dL — ABNORMAL HIGH (ref 70–99)
Glucose-Capillary: 223 mg/dL — ABNORMAL HIGH (ref 70–99)
Glucose-Capillary: 248 mg/dL — ABNORMAL HIGH (ref 70–99)

## 2013-09-18 LAB — BASIC METABOLIC PANEL
BUN: 11 mg/dL (ref 6–23)
CO2: 28 mEq/L (ref 19–32)
Calcium: 8.4 mg/dL (ref 8.4–10.5)
Chloride: 101 mEq/L (ref 96–112)
Creatinine, Ser: 0.79 mg/dL (ref 0.50–1.35)
GFR calc Af Amer: >90 mL/min (ref >90)
GFR calc non Af Amer: >90 mL/min (ref >90)
Glucose, Bld: 227 mg/dL — ABNORMAL HIGH (ref 70–99)
Potassium: 4.2 mEq/L (ref 3.7–5.3)
Sodium: 138 mEq/L (ref 137–147)

## 2013-09-18 LAB — CBC
HCT: 39.8 % (ref 39.0–52.0)
Hemoglobin: 13 g/dL (ref 13.0–17.0)
MCH: 30.2 pg (ref 26.0–34.0)
MCHC: 32.7 g/dL (ref 30.0–36.0)
MCV: 92.6 fL (ref 78.0–100.0)
Platelets: 313 10*3/uL (ref 150–400)
RBC: 4.3 MIL/uL (ref 4.22–5.81)
RDW: 12.8 % (ref 11.5–15.5)
WBC: 7.4 10*3/uL (ref 4.0–10.5)

## 2013-09-18 NOTE — Progress Notes (Signed)
Inpatient Diabetes Program Recommendations  AACE/ADA: New Consensus Statement on Inpatient Glycemic Control (2013)  Target Ranges:  Prepandial:   less than 140 mg/dL      Peak postprandial:   less than 180 mg/dL (1-2 hours)      Critically ill patients:  140 - 180 mg/dL   Reason for Visit: Hyperglycemia  Results for [name], Gregory May (MRN [id number]) as of 09/18/2013 15:35  Ref. Range 09/17/2013 11:19 09/17/2013 16:35 09/17/2013 21:42 09/18/2013 07:51 09/18/2013 11:41  Glucose-Capillary Latest Range: 70-99 mg/dL 409261 (H) 811262 (H) 914224 (H) 182 (H) 213 (H)     Inpatient Diabetes Program Recommendations Insulin - Basal: Increase Lantus to 28 units QHS Insulin - Meal Coverage: Increase meal coverage insulin to Novolog 6 units tidwc HgbA1C: 12.3% - uncontrolled Outpatient Referral: OP Diabetes Education for uncontrolled DM  Note: Will continue to follow. Thank you. Ailene Ardshonda Serena Petterson, RD, LDN, CDE Inpatient Diabetes Coordinator [phone]

## 2013-09-18 NOTE — Consult Note (Signed)
ORTHOPAEDIC CONSULTATION  REQUESTING PHYSICIAN: Theodis Blaze, MD  Chief Complaint: "my right heel started killing me when I got home from work on Monday"  HPI: Gregory May is a 53 y.o. male, uncontrolled diabetic with peripheral neuropathy, who complains of right heel pain.  He first noticed a painful callous in November which he thought got better after buying a new pair of boots.  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  He also noticed some bloody drainage on his sock this past Monday.  Pain located to heel and lateral foot. Throbbing and shooting in nature and is intermittent.  WBC count trending down over the past few days and is now in "normal" range.  Gregory May states that he only sees his PCP once yearly for diabetic foot checks.  He cannot raise his feet up to check them himself due to hx of right THA in 2000.   He admits that he has been non-compliant with monitoring his blood sugar and taking appropriate medication for his diabetes.    Past Medical History  Diagnosis Date  . Sebaceous cyst     on back of neck  . Diabetes mellitus     type II   Past Surgical History  Procedure Laterality Date  . Rotator cuff repair  2005 (approx)    right   . Closed reduction with humer pin insertion  1974    left hip  . Joint replacement  2006 (approx)    right hip replaced   History   Social History  . Marital Status: Single    Spouse Name: N/A    Number of Children: N/A  . Years of Education: N/A   Social History Main Topics  . Smoking status: Never Smoker   . Smokeless tobacco: Never Used  . Alcohol Use: Yes     Comment: occasional  . Drug Use: No  . Sexual Activity: None   Other Topics Concern  . None   Social History Narrative  . None   Family History  Problem Relation Age of Onset  . Cancer Mother     breast,  colon, liver   No Known Allergies Prior to Admission medications   Medication Sig Start Date End Date Taking? Authorizing Provider  ibuprofen (ADVIL,MOTRIN) 200 MG tablet Take 400 mg by mouth every 6 (six) hours as needed (pain).   Yes Historical Provider, MD   Mr Foot Right W Wo Contrast  09/17/2013   CLINICAL DATA:  Soft tissue ulcer on the right heel.  EXAM: MRI OF THE RIGHT FOREFOOT WITHOUT AND WITH CONTRAST  TECHNIQUE: Multiplanar, multisequence MR imaging was performed both before and after administration of intravenous contrast.  CONTRAST:  66m MULTIHANCE GADOBENATE DIMEGLUMINE 529 MG/ML IV SOLN  COMPARISON:  Radiographs dated 09/15/2013  FINDINGS: There is a 20 x 20 x 13 mm abscess in the subcutaneous fat just superficial to the origin of the medial band of the plantar fascia. There are small pockets of pus around this larger abscess.  There is no extension of the infection into the calcaneus or plantar fascia.  The other visualized bones of the foot appear normal. There is a small ankle joint effusion as well as slight tenosynovitis of the medial and lateral tendons at the ankle. There is a ganglion cyst measuring 17 x 6 x 6 mm on  the dorsum of the foot between the proximal shafts of the fourth and fifth metatarsals.  IMPRESSION: 1. Abscess in the subcutaneous fat of the heel with no extension into the bone or plantar fascia. 2. No osteomyelitis or other significant osseous abnormality. 3. Slight tenosynovitis of the medial and lateral tendons at the ankle.   Electronically Signed   By: Rozetta Nunnery M.D.   On: 09/17/2013 08:09    Positive ROS: All other systems have been reviewed and were otherwise negative with the exception of those mentioned in the HPI and as above.  Labs cbc  Recent Labs  09/17/13 0530 09/18/13 0449  WBC 7.7 7.4  HGB 13.8 13.0  HCT 41.6 39.8  PLT 301 313    Labs inflam No results found for this basename: ESR, CRP,  in the last 72 hours  Labs coag No results  found for this basename: INR, PT, PTT,  in the last 72 hours   Recent Labs  09/17/13 0530 09/18/13 0449  NA 136* 138  K 4.0 4.2  CL 100 101  CO2 26 28  GLUCOSE 208* 227*  BUN 11 11  CREATININE 0.70 0.79  CALCIUM 8.6 8.4   MRI: Abscess in subcutaneous fat of heel without extension into bone or plantar fascia.  No osteomyelitis noted.    Physical Exam: Filed Vitals:   09/18/13 0539  BP: 112/68  Pulse: 65  Temp: 97.5 F (36.4 C)  Resp: 18   General: Alert, no acute distress Cardiovascular: No pedal edema Respiratory: No cyanosis, no use of accessory musculature GI: No organomegaly, abdomen is soft and non-tender Skin: No lesions in the area of chief complaint Neurologic: Sensation intact distally  Psychiatric: Patient is competent for consent with normal mood and affect Lymphatic: No axillary or cervical lymphadenopathy  MUSCULOSKELETAL:  Scant drainage noted on dressing Some TTP over heel and lateral aspect of right foot Decreased sensation to plantar and dorsal aspect of right foot EHL and FHL firing No calf tenderness bilatearlly  Other extremities are atraumatic with painless ROM and NVI.  Assessment: Diabetic foot ulcer  Plan: Continue with current antibiotics D/C with PO antibiotics Follow-up with Dr. Sharol Given in his office next week  Larae Grooms, PA-C Cell [phone]   09/18/2013 2:33 PM

## 2013-09-18 NOTE — Progress Notes (Signed)
Patient ID: Gregory May, male   DOB: Feb 25, 1961, 53 y.o.   MRN: [id number] [facility] PROGRESS NOTE  Gregory May [id number] DOB: Feb 25, 1961 DOA: 09/15/2013 PCP: Provider Not In System  Brief narrative:  53 y.o. male with a past medical history of poorly controlled diabetes mellitus, medication nonadherence, having been off of his insulin for the past year [facility] with several weeks' duration of progressively worsening right heel pain. He describes pain as throbbing and constant, 5/10 in severity. He explains that he has not been able to inspect his feet as he was having difficulty crossing his legs. His friend has seen right heel and told him he has blisterlike warmth on the right heel. Patient explains he noticed some blood coming out of it one day prior to admission but no drainage noted. In the emergency department right foot x-ray did not reveal evidence of osteomyelitis.   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  Mr Foot Right W Wo Contrast 09/17/2013 Abscess in the subcutaneous fat of the heel with no extension into the bone or plantar fascia. No osteomyelitis Antibiotics:  Vancomycin 09/15/2013 -->  Zosyn 09/15/2013 -->  Code Status: Full  Family Communication: Pt at bedside  Disposition Plan: Home when medically stable, likely in AM      HPI/Subjective: No events overnight.   Objective: Filed Vitals:   09/17/13 1008 09/17/13 1300 09/17/13 2150 09/18/13 0539  BP: 111/74 144/88 112/77 112/68  Pulse: 80 85 92 65  Temp: 98 F (36.7 C) 98 F (36.7 C) 98.5 F (36.9 C) 97.5 F (36.4 C)  TempSrc: Oral Axillary Oral Oral  Resp:   18 18  Height:      Weight:      SpO2: 96% 97% 99% 97%    Intake/Output Summary (Last 24 hours) at 09/18/13 1422 Last data filed at 09/18/13 0540  Gross per 24 hour  Intake   3725 ml  Output   4150 ml  Net   -425 ml    Exam:   General:  Pt is alert, follows commands appropriately, not in acute distress  Cardiovascular: Regular rate and rhythm, S1/S2, no murmurs, no rubs, no gallops  Respiratory: Clear to auscultation bilaterally, no wheezing, no crackles, no rhonchi  Abdomen: Soft, non tender, non distended, bowel sounds present, no guarding  Extremities: No edema, pulses DP and PT palpable bilaterally, right heal wound with drainage but healing well, non tender to palpation   Neuro: Grossly nonfocal  Data Reviewed: Basic Metabolic Panel:  Recent Labs  Lab 09/15/13 2000 09/16/13 0510 09/17/13 0530 09/18/13 0449  NA 134* 138 136* 138  K 4.1 3.5* 4.0 4.2  CL 95* 101 100 101  CO2 26 28 26 28   GLUCOSE 275* 163* 208* 227*  BUN 12 10 11 11   CREATININE 0.65 0.65 0.70 0.79  CALCIUM 9.0 8.2* 8.6 8.4   CBC:  Recent Labs Lab 09/15/13 2000 09/16/13 0510 09/17/13 0530 09/18/13 0449  WBC 12.6* 8.6 7.7 7.4  NEUTROABS 8.7*  --   --   --   HGB 15.5 13.5 13.8 13.0  HCT 44.3 40.7 41.6 39.8   MCV 91.2 92.1 92.9 92.6  PLT 354 312 301 313   CBG:  Recent Labs Lab 09/17/13 1119 09/17/13 1635 09/17/13 2142 09/18/13 0751 09/18/13 1141  GLUCAP 261* 262* 224* 182* 213*    Recent Results (from the past 240 hour(s))  CULTURE, BLOOD (ROUTINE X 2)     Status: None   Collection Time    09/15/13 10:58 PM      Result Value Range Status   Specimen Description BLOOD LEFT ARM   Final   Special Requests BOTTLES DRAWN AEROBIC AND ANAEROBIC 10CC   Final   Culture  Setup Time     Final   Value: 09/16/2013 03:31     Performed at [facility]   Culture     Final   Value:        BLOOD CULTURE RECEIVED NO GROWTH TO DATE CULTURE WILL BE HELD FOR 5 DAYS BEFORE ISSUING A FINAL NEGATIVE REPORT     Performed at [facility]   Report Status PENDING   Incomplete  CULTURE, BLOOD (ROUTINE X 2)     Status: None   Collection Time    09/15/13 11:03 PM      Result Value Range Status   Specimen Description BLOOD LEFT FORARM   Final   Special Requests BOTTLES DRAWN AEROBIC AND ANAEROBIC 8CC    Final   Culture  Setup Time     Final   Value: 09/16/2013 03:30     Performed at [facility]   Culture     Final   Value:        BLOOD CULTURE RECEIVED NO GROWTH TO DATE CULTURE WILL BE HELD FOR 5 DAYS BEFORE ISSUING A FINAL NEGATIVE REPORT     Performed at [facility]   Report Status PENDING   Incomplete     Scheduled Meds: . enoxaparin (LOVENOX) injection  40 mg Subcutaneous QHS  . hydrocerin   Topical Daily  . insulin aspart  0-15 Units Subcutaneous TID WC  . insulin aspart  0-5 Units Subcutaneous QHS  . insulin aspart  4 Units Subcutaneous TID WC  . insulin glargine  24 Units Subcutaneous QHS  . piperacillin-tazobactam (ZOSYN)  IV  3.375 g Intravenous Q8H  . vancomycin  1,000 mg Intravenous Q8H   Continuous Infusions: . sodium chloride 125 mL/hr at 09/17/13 1901     Debbora Presto, MD  [facility] Pager [phone]  If 7PM-7AM, please contact  night-coverage [email or web address] Password [email or web address] 09/18/2013, 2:22 PM   LOS: 3 days

## 2013-09-19 LAB — BASIC METABOLIC PANEL
BUN: 13 mg/dL (ref 6–23)
CO2: 30 mEq/L (ref 19–32)
Calcium: 8.8 mg/dL (ref 8.4–10.5)
Chloride: 100 mEq/L (ref 96–112)
Creatinine, Ser: 0.84 mg/dL (ref 0.50–1.35)
GFR calc Af Amer: >90 mL/min (ref >90)
GFR calc non Af Amer: >90 mL/min (ref >90)
Glucose, Bld: 194 mg/dL — ABNORMAL HIGH (ref 70–99)
Potassium: 4.1 mEq/L (ref 3.7–5.3)
Sodium: 138 mEq/L (ref 137–147)

## 2013-09-19 LAB — GLUCOSE, CAPILLARY
Glucose-Capillary: 175 mg/dL — ABNORMAL HIGH (ref 70–99)
Glucose-Capillary: 236 mg/dL — ABNORMAL HIGH (ref 70–99)
Glucose-Capillary: 204 mg/dL — ABNORMAL HIGH (ref 70–99)
Glucose-Capillary: 184 mg/dL — ABNORMAL HIGH (ref 70–99)

## 2013-09-19 LAB — CBC
HCT: 42.9 % (ref 39.0–52.0)
Hemoglobin: 14.1 g/dL (ref 13.0–17.0)
MCH: 30.7 pg (ref 26.0–34.0)
MCHC: 32.9 g/dL (ref 30.0–36.0)
MCV: 93.5 fL (ref 78.0–100.0)
Platelets: 332 10*3/uL (ref 150–400)
RBC: 4.59 MIL/uL (ref 4.22–5.81)
RDW: 13 % (ref 11.5–15.5)
WBC: 8.2 10*3/uL (ref 4.0–10.5)

## 2013-09-19 LAB — VANCOMYCIN, TROUGH: Vancomycin Tr: 16.8 ug/mL (ref 10.0–20.0)

## 2013-09-19 MED ORDER — INSULIN GLARGINE 100 UNIT/ML ~~LOC~~ SOLN
25.0000 [IU] | Freq: Every day | SUBCUTANEOUS | Status: DC
  Administered 2013-09-19 – 2013-09-20 (×2): 25 [IU] via SUBCUTANEOUS
  Filled 2013-09-19 (×3): qty 0.25

## 2013-09-19 MED ORDER — SULFAMETHOXAZOLE-TMP DS 800-160 MG PO TABS
1.0000 | ORAL_TABLET | Freq: Two times a day (BID) | ORAL | Status: DC
  Administered 2013-09-19 – 2013-09-21 (×5): 1 via ORAL
  Filled 2013-09-19 (×6): qty 1

## 2013-09-19 NOTE — Consult Note (Signed)
I participated in the care of this patient and agree with the above history, physical and evaluation. I performed a review of the history and a physical exam as detailed   Mohd. Derflinger Daniel Ticara Waner MD  

## 2013-09-19 NOTE — Progress Notes (Signed)
Patient ID: Gregory May, male   DOB: [birth year], 53 y.o.   MRN: [id number] [facility] PROGRESS NOTE  Gregory May [id number] DOB: [birth year] DOA: 09/15/2013 PCP: Provider Not In System  Brief narrative:  53 y.o. male with a past medical history of poorly controlled diabetes mellitus, medication nonadherence, having been off of his insulin for the past year [facility] with several weeks' duration of progressively worsening right heel pain. He describes pain as throbbing and constant, 5/10 in severity. He explains that he has not been able to inspect his feet as he was having difficulty crossing his legs. His friend has seen right heel and told him he has blisterlike warmth on the right heel. Patient explains he noticed some blood coming out of it one day prior to admission but no drainage noted. In the emergency department right foot x-ray did not reveal evidence of osteomyelitis.   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  Mr Foot Right W Wo Contrast 09/17/2013 Abscess in the subcutaneous fat of the heel with no extension into the bone or plantar fascia. No osteomyelitis Antibiotics:  Vancomycin 09/15/2013 -->  2/7 Zosyn 09/15/2013 --> 2/7 Bactrim 2/7 -->   Code Status: Full  Family Communication: Pt at bedside  Disposition Plan: Home when medically stable, likely in AM   HPI/Subjective: No events overnight.   Objective: Filed Vitals:   09/18/13 0539 09/18/13 1440 09/18/13 2121 09/19/13 0620  BP: 112/68 131/76 126/76 126/76  Pulse: 65 68 70 71  Temp: 97.5 F (36.4 C) 97.6 F (36.4 C) 98.4 F (36.9 C) 98 F (36.7 C)  TempSrc: Oral Oral Oral Oral  Resp: 18 20 20 20   Height:      Weight:      SpO2: 97% 96% 95% 94%    Intake/Output Summary (Last 24 hours) at 09/19/13 1417 Last data filed at 09/18/13 1900  Gross per 24 hour  Intake   2000 ml  Output      0 ml  Net   2000 ml    Exam:   General:  Pt is alert, follows commands appropriately, not in acute distress  Cardiovascular: Regular rate and rhythm, S1/S2, no murmurs, no rubs, no gallops  Respiratory: Clear to auscultation bilaterally, no wheezing, no crackles, no rhonchi  Abdomen: Soft, non tender, non distended, bowel sounds present, no guarding  Extremities: No edema, pulses DP and PT palpable bilaterally, right heel blister healing well, less drainage, no blood   Neuro: Grossly nonfocal  Data  Reviewed: Basic Metabolic Panel:  Recent Labs Lab 09/15/13 2000 09/16/13 0510 09/17/13 0530 09/18/13 0449 09/19/13 0520  NA 134* 138 136* 138 138  K 4.1 3.5* 4.0 4.2 4.1  CL 95* 101 100 101 100  CO2 26 28 26 28 30   GLUCOSE 275* 163* 208* 227* 194*  BUN 12 10 11 11 13   CREATININE 0.65 0.65 0.70 0.79 0.84  CALCIUM 9.0 8.2* 8.6 8.4 8.8   CBC:  Recent Labs Lab 09/15/13 2000 09/16/13 0510 09/17/13 0530 09/18/13 0449 09/19/13 0520   WBC 12.6* 8.6 7.7 7.4 8.2  NEUTROABS 8.7*  --   --   --   --   HGB 15.5 13.5 13.8 13.0 14.1  HCT 44.3 40.7 41.6 39.8 42.9  MCV 91.2 92.1 92.9 92.6 93.5  PLT 354 312 301 313 332   CBG:  Recent Labs Lab 09/18/13 1141 09/18/13 1656 09/18/13 2118 09/19/13 0748 09/19/13 1221  GLUCAP 213* 223* 248* 175* 236*    Recent Results (from the past 240 hour(s))  CULTURE, BLOOD (ROUTINE X 2)     Status: None   Collection Time    09/15/13 10:58 PM      Result Value Range Status   Specimen Description BLOOD LEFT ARM   Final   Special Requests BOTTLES DRAWN AEROBIC AND ANAEROBIC 10CC   Final   Culture  Setup Time     Final   Value: 09/16/2013 03:31     Performed at [facility]   Culture     Final   Value:        BLOOD CULTURE RECEIVED NO GROWTH TO DATE CULTURE WILL BE HELD FOR 5 DAYS BEFORE ISSUING A FINAL NEGATIVE REPORT     Performed at [facility]   Report Status PENDING   Incomplete  CULTURE, BLOOD (ROUTINE X 2)     Status: None   Collection Time    09/15/13 11:03 PM      Result Value Range Status   Specimen Description BLOOD LEFT FORARM   Final   Special Requests BOTTLES DRAWN AEROBIC AND ANAEROBIC 8CC    Final   Culture  Setup Time     Final   Value: 09/16/2013 03:30     Performed at [facility]   Culture     Final   Value:        BLOOD CULTURE RECEIVED NO GROWTH TO DATE CULTURE WILL BE HELD FOR 5 DAYS BEFORE ISSUING A FINAL NEGATIVE REPORT     Performed at [facility]   Report Status PENDING   Incomplete     Scheduled Meds: . enoxaparin (LOVENOX) injection  40 mg Subcutaneous QHS  . hydrocerin   Topical Daily  . insulin aspart  0-15 Units Subcutaneous TID WC  . insulin aspart  0-5 Units Subcutaneous QHS  . insulin aspart  4 Units Subcutaneous TID WC  . insulin glargine  25 Units Subcutaneous QHS  . sulfamethoxazole-trimethoprim  1 tablet Oral Q12H   Continuous Infusions:   Debbora Presto, MD  [facility] Pager [phone]  If  7PM-7AM, please contact night-coverage [email or web address] Password [email or web address] 09/19/2013, 2:17 PM   LOS: 4 days

## 2013-09-19 NOTE — Progress Notes (Signed)
ANTIBIOTIC CONSULT NOTE - FOLLOW UP  Pharmacy Consult for Vancomycin/Zosyn  Indication: Diabetic foot infection/abscess   No Known Allergies  Patient Measurements: Height: 5\' 11"  (180.3 cm) Weight: 195 lb (88.451 kg) IBW/kg (Calculated) : 75.3   Vital Signs: Temp: 98 F (36.7 C) (02/07 0620) Temp src: Oral (02/07 0620) BP: 126/76 mmHg (02/07 0620) Pulse Rate: 71 (02/07 0620) Intake/Output from previous day: 02/06 0701 - 02/07 0700 In: 2200 [I.V.:1750; IV Piggyback:450] Out: 1100 [Urine:1100] Intake/Output from this shift:    Labs:  Recent Labs  09/17/13 0530 09/18/13 0449 09/19/13 0520  WBC 7.7 7.4 8.2  HGB 13.8 13.0 14.1  PLT 301 313 332  CREATININE 0.70 0.79 0.84   Estimated Creatinine Clearance: 112.1 ml/min (by C-G formula based on Cr of 0.84).  Recent Labs  09/17/13 1130 09/19/13 0856  VANCOTROUGH 10.3 16.8     Microbiology: Recent Results (from the past 720 hour(s))  CULTURE, BLOOD (ROUTINE X 2)     Status: None   Collection Time    09/15/13 10:58 PM      Result Value Range Status   Specimen Description BLOOD LEFT ARM   Final   Special Requests BOTTLES DRAWN AEROBIC AND ANAEROBIC 10CC   Final   Culture  Setup Time     Final   Value: 09/16/2013 03:31     Performed at [facility]   Culture     Final   Value:        BLOOD CULTURE RECEIVED NO GROWTH TO DATE CULTURE WILL BE HELD FOR 5 DAYS BEFORE ISSUING A FINAL NEGATIVE REPORT     Performed at [facility]   Report Status PENDING   Incomplete  CULTURE, BLOOD (ROUTINE X 2)     Status: None   Collection Time    09/15/13 11:03 PM      Result Value Range Status   Specimen Description BLOOD LEFT FORARM   Final   Special Requests BOTTLES DRAWN AEROBIC AND ANAEROBIC 8CC    Final   Culture  Setup Time     Final   Value: 09/16/2013 03:30     Performed at [facility]   Culture     Final   Value:        BLOOD CULTURE RECEIVED NO GROWTH TO DATE CULTURE WILL BE HELD FOR 5 DAYS  BEFORE ISSUING A FINAL NEGATIVE REPORT     Performed at [facility]   Report Status PENDING   Incomplete    Anti-infectives   Start     Dose/Rate Route Frequency Ordered Stop   09/17/13 1800  vancomycin (VANCOCIN) IVPB 1000 mg/200 mL premix     1,000 mg 200 mL/hr over 60 Minutes Intravenous Every 8 hours 09/17/13 1316     09/16/13 0600  piperacillin-tazobactam (ZOSYN) IVPB 3.375 g     3.375 g 12.5 mL/hr over 240 Minutes Intravenous 3 times per day 09/15/13 2257     09/16/13 0200  piperacillin-tazobactam (ZOSYN) IVPB 3.375 g  Status:  Discontinued     3.375 g 12.5 mL/hr over 240 Minutes Intravenous Every 8 hours 09/15/13 2051 09/15/13 2257   09/15/13 2359  vancomycin (VANCOCIN) 1,500 mg in sodium chloride 0.9 % 500 mL IVPB  Status:  Discontinued     1,500 mg 250 mL/hr over 120 Minutes Intravenous Every 12 hours 09/15/13 2323 09/17/13 1316   09/15/13 2045  piperacillin-tazobactam (ZOSYN) IVPB 3.375 g     3.375 g 100 mL/hr over 30 Minutes Intravenous  Once 09/15/13 2042 09/15/13 2140      Assessment: 53 yo M with Diabetic foot infection.   MRI showed abscess.  MRI negative for OM.    Patient on D#4 Vancomycin 1500 mg IV q12h, D#5 Zosyn EI.    Vancomycin trough 16.8, below goal of 15-20 for abscess.  Patients renal function stable, CrCl > 100 ml/min   AF, WBC WNL  2/3 Blood x 2: NGTD  Noted plan per ortho to continue current abx's and d/c on PO abx's with follow up in ortho office  2/3 >> Zosyn >> 2/4 >>Vancomycin >>   Goal of Therapy:  Vancomycin trough level 15-20 mcg/ml Zosyn Per renal function   Plan:  1) Continue vancomycin 1g q8 2) Continue current zosyn dosing   Gregory May, PharmD, BCPS Pager [phone] 09/19/2013 9:51 AM

## 2013-09-20 LAB — BASIC METABOLIC PANEL
BUN: 11 mg/dL (ref 6–23)
CO2: 29 mEq/L (ref 19–32)
Calcium: 9 mg/dL (ref 8.4–10.5)
Chloride: 102 mEq/L (ref 96–112)
Creatinine, Ser: 0.82 mg/dL (ref 0.50–1.35)
GFR calc Af Amer: >90 mL/min (ref >90)
GFR calc non Af Amer: >90 mL/min (ref >90)
Glucose, Bld: 163 mg/dL — ABNORMAL HIGH (ref 70–99)
Potassium: 4.1 mEq/L (ref 3.7–5.3)
Sodium: 140 mEq/L (ref 137–147)

## 2013-09-20 LAB — CBC
HCT: 41.6 % (ref 39.0–52.0)
Hemoglobin: 13.9 g/dL (ref 13.0–17.0)
MCH: 30.7 pg (ref 26.0–34.0)
MCHC: 33.4 g/dL (ref 30.0–36.0)
MCV: 91.8 fL (ref 78.0–100.0)
Platelets: 329 10*3/uL (ref 150–400)
RBC: 4.53 MIL/uL (ref 4.22–5.81)
RDW: 12.9 % (ref 11.5–15.5)
WBC: 8.5 10*3/uL (ref 4.0–10.5)

## 2013-09-20 LAB — GLUCOSE, CAPILLARY
Glucose-Capillary: 137 mg/dL — ABNORMAL HIGH (ref 70–99)
Glucose-Capillary: 154 mg/dL — ABNORMAL HIGH (ref 70–99)
Glucose-Capillary: 236 mg/dL — ABNORMAL HIGH (ref 70–99)
Glucose-Capillary: 236 mg/dL — ABNORMAL HIGH (ref 70–99)

## 2013-09-20 NOTE — Progress Notes (Signed)
Patient ID: Gregory May Neth, male   DOB: Nov 20, 1960, 53 y.o.   MRN: [id number]  [facility] PROGRESS NOTE  Gregory May Brucks [id number] DOB: Nov 20, 1960 DOA: 09/15/2013 PCP: Provider Not In System  Brief narrative:  53 y.o. male with a past medical history of poorly controlled diabetes mellitus, medication nonadherence, having been off of his insulin for the past year presented to [facility] emergency department with several weeks' duration of progressively worsening right heel pain. He describes pain as throbbing and constant, 5/10 in severity. He explains that he has not been able to inspect his feet as he was having difficulty crossing his legs. His friend has seen right heel and told him he has blisterlike warmth on the right heel. Patient explains he noticed some blood coming out of it one day prior to admission but no drainage noted. In the emergency department right foot x-ray did not reveal evidence of osteomyelitis.   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  Mr Foot Right W Wo Contrast 09/17/2013 Abscess in the subcutaneous fat of the heel with no extension into the bone or plantar fascia. No osteomyelitis Antibiotics:  Vancomycin 09/15/2013 --> 2/7  Zosyn 09/15/2013 --> 2/7  Bactrim 2/7 -->   Code Status: Full  Family Communication: Pt at bedside  Disposition Plan: Home when medically stable, likely in AM    HPI/Subjective: No events overnight.   Objective: Filed Vitals:   09/19/13 0620 09/19/13 1400 09/19/13 2136 09/20/13 0606  BP: 126/76 117/71 130/81 104/63  Pulse: 71 78 64 65  Temp: 98 F (36.7 C) 97.9 F (36.6 C) 97.5 F (36.4 C) 97.9 F (36.6 C)  TempSrc: Oral Oral Oral Oral  Resp: 20 20 20 20   Height:      Weight:      SpO2: 94% 100% 95% 96%    Intake/Output Summary (Last 24 hours) at 09/20/13 1310 Last data filed at 09/20/13 0900  Gross per 24 hour  Intake    360 ml  Output      0 ml  Net    360 ml    Exam:   General:  Pt is alert, follows commands appropriately, not in acute distress  Cardiovascular: Regular rate and rhythm, S1/S2, no murmurs, no rubs, no gallops  Respiratory: Clear to auscultation bilaterally, no wheezing, no crackles, no rhonchi  Abdomen: Soft, non tender, non distended, bowel sounds present, no guarding   Data Reviewed: Basic Metabolic Panel:  Recent Labs Lab 09/16/13 0510 09/17/13 0530 09/18/13 0449 09/19/13 0520 09/20/13 [id number]  NA 138 136* 138 138 140  K 3.5* 4.0 4.2 4.1 4.1  CL 101 100 101 100 102  CO2 28 26 28 30 29   GLUCOSE 163* 208* 227* 194* 163*  BUN 10 11 11 13 11   CREATININE 0.65 0.70 0.79 0.84 0.82  CALCIUM 8.2* 8.6 8.4 8.8 9.0   CBC:  Recent Labs Lab 09/15/13 2000 09/16/13 0510 09/17/13 0530 09/18/13 0449 09/19/13 0520 09/20/13 0555  WBC 12.6* 8.6 7.7 7.4 8.2 8.5  NEUTROABS 8.7*  --   --   --   --   --   HGB 15.5 13.5 13.8 13.0 14.1 13.9  HCT 44.3 40.7 41.6 39.8 42.9 41.6  MCV 91.2 92.1 92.9 92.6 93.5 91.8  PLT  354 312 301 313 332 329   CBG:  Recent Labs Lab 09/19/13 1221 09/19/13 1634 09/19/13 2135 09/20/13 0727 09/20/13 1154  GLUCAP 236* 204* 184* 137* 154*    Recent Results (from the past 240 hour(s))  CULTURE, BLOOD (ROUTINE X 2)     Status: None   Collection Time    09/15/13 10:58 PM      Result Value Range Status   Specimen Description BLOOD LEFT ARM   Final   Special Requests BOTTLES DRAWN AEROBIC AND ANAEROBIC 10CC   Final   Culture  Setup Time     Final   Value: 09/16/2013 03:31     Performed at [facility]   Culture     Final   Value:        BLOOD CULTURE RECEIVED NO GROWTH TO DATE CULTURE WILL BE HELD FOR 5 DAYS BEFORE ISSUING A FINAL NEGATIVE REPORT     Performed at [facility]   Report Status PENDING   Incomplete  CULTURE, BLOOD (ROUTINE X 2)     Status: None   Collection Time    09/15/13 11:03 PM      Result Value Range Status   Specimen Description BLOOD LEFT FORARM   Final   Special Requests BOTTLES DRAWN AEROBIC AND ANAEROBIC 8CC    Final   Culture  Setup Time     Final   Value: 09/16/2013 03:30     Performed at [facility]   Culture     Final   Value:        BLOOD CULTURE RECEIVED NO GROWTH TO DATE CULTURE WILL BE HELD FOR 5 DAYS BEFORE ISSUING A FINAL NEGATIVE REPORT     Performed at [facility]   Report Status PENDING   Incomplete     Scheduled Meds: . enoxaparin (LOVENOX) injection  40 mg Subcutaneous QHS  . hydrocerin   Topical Daily  . insulin aspart  0-15 Units Subcutaneous TID WC  . insulin aspart  0-5 Units Subcutaneous QHS  . insulin aspart  4 Units Subcutaneous TID WC  . insulin glargine  25 Units Subcutaneous QHS  . sulfamethoxazole-trimethoprim  1 tablet Oral Q12H   Continuous Infusions:  Debbora Presto, MD  [facility] Pager [phone]  If 7PM-7AM, please contact night-coverage [email or web address] Password [email or web address] 09/20/2013, 1:10 PM   LOS: 5 days

## 2013-09-21 LAB — GLUCOSE, CAPILLARY
Glucose-Capillary: 99 mg/dL (ref 70–99)
Glucose-Capillary: 159 mg/dL — ABNORMAL HIGH (ref 70–99)

## 2013-09-21 MED ORDER — HYDROCERIN EX CREA: 1.0000 "application " | TOPICAL_CREAM | Freq: Every day | CUTANEOUS | Status: DC

## 2013-09-21 MED ORDER — OXYCODONE HCL 5 MG PO TABS: 5.0000 mg | ORAL_TABLET | ORAL | Status: DC | PRN

## 2013-09-21 MED ORDER — SULFAMETHOXAZOLE-TMP DS 800-160 MG PO TABS: 1.0000 | ORAL_TABLET | Freq: Two times a day (BID) | ORAL | Status: DC

## 2013-09-21 MED ORDER — INSULIN GLARGINE 100 UNIT/ML ~~LOC~~ SOLN: 25.0000 [IU] | Freq: Every day | SUBCUTANEOUS | Status: DC

## 2013-09-21 NOTE — Progress Notes (Signed)
Patient states he is not interested in My Chart due to the inavailability of a computer

## 2013-09-21 NOTE — Discharge Summary (Signed)
Physician Discharge Summary  Bevely PalmerVergil Tietje ZOX:[id number] DOB: 04-May-1961 DOA: 09/15/2013  PCP: Provider Not In System  Admit date: 09/15/2013 Discharge date: 09/21/2013  Recommendations for Outpatient Follow-up:  1. Pt will need to follow up with PCP in 2-3 weeks post discharge 2. Please obtain BMP to evaluate electrolytes and kidney function 3. Please also check CBC to evaluate Hg and Hct levels 4. Pt discharge on bactrim to complete therapy for 2 more weeks post discharge 5. PT also advised to call to schedule and appointment with Dr. 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 male with a past medical history of poorly controlled diabetes mellitus, medication nonadherence, having been off of his insulin for the past year presented to [facility] emergency department with several weeks' duration of progressively worsening right heel pain. He describes pain as throbbing and constant, 5/10 in severity. He explains that he has not been able to inspect his feet as he was having difficulty crossing his legs. His friend has seen right heel and told him he has blisterlike warmth on the right heel. Patient explains he noticed some blood coming out of it one day prior to admission but no drainage noted. In the emergency department right foot x-ray did not reveal evidence of osteomyelitis.   Active Problems:  Diabetic foot ulcer associated with type 2 diabetes mellitus  - In patient with poorly controlled diabetes, secondary to medical noncompliance A1c 12.3  - MRI of the right foot with abscess but no osteomyelitis  - completed vancomycin and Zosyn day 6, now on Bactrim day  #3, will continue Bactrim for 2 more weeks post discharge  - Continue wound care  - appreciate ortho input - pt advised to schedule an appointment with Dr. 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  Mr Foot Right W Wo Contrast 09/17/2013 Abscess in the subcutaneous fat of the heel with no extension into the bone or plantar fascia. No osteomyelitis Antibiotics:  Vancomycin 09/15/2013 --> 2/7  Zosyn 09/15/2013 --> 2/7  Bactrim 2/7 --> 2 more weeks post discharge   Code Status: Full  Family Communication: Pt at bedside   Discharge Exam: Filed Vitals:   09/21/13 0616  BP: 124/79  Pulse: 52  Temp: 97.6 F (36.4 C)  Resp: 18   Filed Vitals:   09/20/13 0606 09/20/13 1420 09/20/13 2148 09/21/13 0616  BP: 104/63 120/69 120/71 124/79  Pulse: 65 68 75 52  Temp: 97.9 F (36.6 C) 97.5 F (36.4 C) 97.8 F (36.6 C) 97.6 F (36.4 C)  TempSrc: Oral Oral Oral Oral  Resp: 20 18 18 18   Height:  Weight:      SpO2: 96% 95% 95% 97%    General: Pt is alert, follows commands appropriately, not in acute distress Cardiovascular: Regular rate and rhythm, S1/S2 +, no murmurs, no rubs, no gallops Respiratory: Clear to auscultation bilaterally, no wheezing, no crackles, no rhonchi Abdominal: Soft, non tender, non distended, bowel sounds +, no guarding Extremities: no edema, no cyanosis, pulses palpable bilaterally DP  and PT right heel ulcer healing well, minimal drainage noted  Neuro: Grossly nonfocal  Discharge Instructions  Discharge Orders   Future Appointments Provider Department Dept Phone   09/23/2013 8:45 AM Reather Littler, MD [facility] [phone]   10/16/2013 9:00 AM Vevelyn Royals, RD [facility] [phone]   10/27/2013 3:30 PM Doris Cheadle, MD [facility] [phone]   Future Orders Complete By Expires   Ambulatory referral to Nutrition and Diabetic Education  As directed    Diet - low sodium heart healthy  As directed    Increase activity slowly  As directed        Medication List         hydrocerin Crea  Apply 1 application topically daily.     ibuprofen 200 MG tablet  Commonly known as:  ADVIL,MOTRIN  Take 400 mg by mouth every 6 (six) hours as needed (pain).     insulin glargine 100 UNIT/ML injection  Commonly known as:  LANTUS  Inject 0.25 mLs (25 Units total) into the skin at bedtime.     oxyCODONE 5 MG immediate release tablet  Commonly known as:  Oxy IR/ROXICODONE  Take 1 tablet (5 mg total) by mouth every 4 (four) hours as needed for moderate pain.     sulfamethoxazole-trimethoprim 800-160 MG per tablet  Commonly known as:  BACTRIM DS  Take 1 tablet by mouth every 12 (twelve) hours.           Follow-up Information   Follow up with Debbora Presto, MD On 10/27/2013.   Specialty:  Internal Medicine   Contact information:   [address] [place] [address] [phone]       Follow up with Reather Littler, MD On 09/23/2013. (appointment scheduled Feb 11th at 8:45 am)    Specialty:  Endocrinology   Contact information:   [address] [place] [address] [phone]       Follow up with Nadara Mustard, MD. (you will be called for an appointment time and date but if you do not hear from anyone this week please call the number above )     Specialty:  Orthopedic Surgery   Contact information:   [address] [place] [address] [phone]        The results of significant diagnostics from this hospitalization (including imaging, microbiology, ancillary and laboratory) are listed below for reference.     Microbiology: Recent Results (from the past 240 hour(s))  CULTURE, BLOOD (ROUTINE X 2)     Status: None   Collection Time    09/15/13 10:58 PM      Result Value Range Status   Specimen Description BLOOD LEFT ARM   Final   Special Requests BOTTLES DRAWN AEROBIC AND ANAEROBIC 10CC   Final   Culture  Setup Time     Final   Value: 09/16/2013 03:31     Performed at [facility]   Culture     Final   Value:  BLOOD CULTURE RECEIVED NO GROWTH TO DATE CULTURE WILL BE HELD FOR 5 DAYS BEFORE ISSUING A FINAL NEGATIVE REPORT     Performed at [facility]   Report Status PENDING   Incomplete  CULTURE, BLOOD (ROUTINE X 2)     Status: None   Collection Time    09/15/13 11:03 PM      Result Value Range Status   Specimen Description BLOOD LEFT FORARM   Final   Special Requests BOTTLES DRAWN AEROBIC AND ANAEROBIC 8CC    Final   Culture  Setup Time     Final   Value: 09/16/2013 03:30     Performed at [facility]   Culture     Final   Value:        BLOOD CULTURE RECEIVED NO GROWTH TO DATE CULTURE WILL BE HELD FOR 5 DAYS BEFORE ISSUING A FINAL NEGATIVE REPORT     Performed at [facility]   Report Status PENDING   Incomplete     Labs: Basic Metabolic Panel:  Recent Labs Lab 09/16/13 0510 09/17/13 0530 09/18/13 0449 09/19/13 0520 09/20/13 0555  NA 138 136* 138 138 140  K 3.5* 4.0 4.2 4.1 4.1  CL 101 100 101 100 102  CO2 28 26 28 30 29   GLUCOSE 163* 208* 227* 194* 163*  BUN 10 11 11 13 11   CREATININE 0.65 0.70 0.79 0.84 0.82  CALCIUM 8.2* 8.6 8.4 8.8 9.0   CBC:  Recent Labs Lab 09/15/13 2000 09/16/13 0510 09/17/13 0530 09/18/13 0449 09/19/13 0520  09/20/13 0555  WBC 12.6* 8.6 7.7 7.4 8.2 8.5  NEUTROABS 8.7*  --   --   --   --   --   HGB 15.5 13.5 13.8 13.0 14.1 13.9  HCT 44.3 40.7 41.6 39.8 42.9 41.6  MCV 91.2 92.1 92.9 92.6 93.5 91.8  PLT 354 312 301 313 332 329   CBG:  Recent Labs Lab 09/20/13 1154 09/20/13 1652 09/20/13 2147 09/21/13 0702 09/21/13 1117  GLUCAP 154* 236* 236* 99 159*   SIGNED: Time coordinating discharge: Over 30 minutes  Debbora Presto, MD  [facility] 09/21/2013, 11:33 AM Pager [phone]  If 7PM-7AM, please contact night-coverage [email or web address] Password [email or web address]

## 2013-09-21 NOTE — Discharge Instructions (Signed)
Wound Care Wound care helps prevent pain and infection.  You may need a tetanus shot if:  You cannot remember when you had your last tetanus shot.  You have never had a tetanus shot.  The injury broke your skin. If you need a tetanus shot and you choose not to have one, you may get tetanus. Sickness from tetanus can be serious. HOME CARE   Only take medicine as told by your doctor.  Clean the wound daily with mild soap and water.  Change any bandages (dressings) as told by your doctor.  Put medicated cream and a bandage on the wound as told by your doctor.  Change the bandage if it gets wet, dirty, or starts to smell.  Take showers. Do not take baths, swim, or do anything that puts your wound under water.  Rest and raise (elevate) the wound until the pain and puffiness (swelling) are better.  Keep all doctor visits as told. GET HELP RIGHT AWAY IF:   Yellowish-white fluid (pus) comes from the wound.  Medicine does not lessen your pain.  There is a red streak going away from the wound.  You have a fever. MAKE SURE YOU:   Understand these instructions.  Will watch your condition.  Will get help right away if you are not doing well or get worse. Document Released: 05/08/2008 Document Revised: 10/22/2011 Document Reviewed: 12/03/2010 Exit[employer] Patient Information 2014 [employer].  

## 2013-09-21 NOTE — Progress Notes (Signed)
IV site outdated, initiated on 09/16/13.  Discussed with Dr. Izola PriceMyers, not restarting due to being discharged home today

## 2013-09-22 LAB — CULTURE, BLOOD (ROUTINE X 2)
Culture: NO GROWTH
Culture: NO GROWTH

## 2013-09-23 ENCOUNTER — Other Ambulatory Visit: Payer: Self-pay | Admitting: *Deleted

## 2013-09-23 ENCOUNTER — Ambulatory Visit (INDEPENDENT_AMBULATORY_CARE_PROVIDER_SITE_OTHER): Payer: Managed Care, Other (non HMO) | Admitting: Endocrinology

## 2013-09-23 ENCOUNTER — Encounter: Payer: Self-pay | Admitting: Endocrinology

## 2013-09-23 VITALS — BP 122/76 | HR 78 | Temp 97.6°F | Resp 14 | Ht 71.5 in | Wt 186.6 lb

## 2013-09-23 DIAGNOSIS — E7800 Pure hypercholesterolemia, unspecified: Secondary | ICD-10-CM

## 2013-09-23 DIAGNOSIS — E1165 Type 2 diabetes mellitus with hyperglycemia: Principal | ICD-10-CM

## 2013-09-23 DIAGNOSIS — IMO0001 Reserved for inherently not codable concepts without codable children: Secondary | ICD-10-CM

## 2013-09-23 MED ORDER — INSULIN ASPART 100 UNIT/ML FLEXPEN: PEN_INJECTOR | SUBCUTANEOUS | Status: DC

## 2013-09-23 NOTE — Patient Instructions (Addendum)
Novolog 7 units at Bfst and lunch and 10 units at supper  Lantus 25 units in am  Please check blood sugars at least half the time about 2 hours after any meal and as directed on waking up. Please bring blood sugar monitor to each visit

## 2013-09-23 NOTE — Progress Notes (Signed)
Gregory May  Reason for Appointment: Diabetes follow-up   History of Present Illness   Diagnosis: Type 2 DIABETES MELITUS, date of diagnosis:  1999     Previous history: He has previously been treated with metformin and Victoza and subsequently mealtime insulin added to control postprandial hyperglycemia Overall he has been  relatively noncompliant with his diet, medications, monitoring and followup Previously would not take Victoza regularly because of cost. Has not been taking any medications for the last year and a half because of commitments to family and cost  Recent history: Since his hospitalization for foot ulcer and marked hyperglycemia he was started back on Lantus for the last week. Has not taken any other treatment for his diabetes and only started checking his blood sugar this morning     Oral hypoglycemic drugs:        Side effects from medications: None Insulin regimen: 25         Proper timing of medications in relation to meals: Yes.          Monitors blood glucose: Once a day.    Glucometer: One Touch.          Blood Glucose readings 175 in am daily         Meals: 3 meals per day.          Physical activity: exercise: None           Weight control: He has lost 20 lbs in the last year or so  Wt Readings from Last 3 Encounters:  09/23/13 186 lb 9.6 oz (84.641 kg)  09/15/13 195 lb (88.451 kg)  09/15/13 195 lb (88.451 kg)          Complications: Diabetic foot ulcer     Diabetes labs:  Lab Results  Component Value Date   HGBA1C 12.3* 09/15/2013   Lab Results  Component Value Date   CREATININE 0.82 09/20/2013       Medication List       This list is accurate as of: 09/23/13  9:48 AM.  Always use your most recent med list.               hydrocerin Crea  Apply 1 application topically daily.     ibuprofen 200 MG tablet  Commonly known as:  ADVIL,MOTRIN  Take 400 mg by mouth every 6 (six) hours as needed (pain).     insulin glargine 100 UNIT/ML  injection  Commonly known as:  LANTUS  Inject 0.25 mLs (25 Units total) into the skin at bedtime.     oxyCODONE 5 MG immediate release tablet  Commonly known as:  Oxy IR/ROXICODONE  Take 1 tablet (5 mg total) by mouth every 4 (four) hours as needed for moderate pain.     sulfamethoxazole-trimethoprim 800-160 MG per tablet  Commonly known as:  BACTRIM DS  Take 1 tablet by mouth every 12 (twelve) hours.        Allergies: No Known Allergies  Past Medical History  Diagnosis Date  . Sebaceous cyst     on back of neck  . Diabetes mellitus     type II    Past Surgical History  Procedure Laterality Date  . Rotator cuff repair  2005 (approx)    right   . Closed reduction with humer pin insertion  1974    left hip  . 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 Cancer Mother     breast, colon, liver    Social History:  reports that he has never smoked. He has never used smokeless tobacco. He reports that he drinks alcohol. He reports that he does not use illicit drugs.  Review of Systems:  Hypertension:  not present  Lipids: He was previously on Pravachol with good control of LDL  No results found for this basename: CHOL, HDL, LDLCALC, LDLDIRECT, TRIG, CHOLHDL      LABS:  Admission on 09/15/2013, Discharged on 09/21/2013  No results displayed because visit has over 200 results.       Examination:   BP 122/76  Pulse 78  Temp(Src) 97.6 F (36.4 C)  Resp 14  Ht 5' 11.5" (1.816 m)  Wt 186 lb 9.6 oz (84.641 kg)  BMI 25.67 kg/m2  SpO2 93%  Body mass index is 25.67 kg/(m^2).    ASSESSMENT/ PLAN::    Diabetes type 2:  Blood glucose control has been poor because of noncompliance with all his medications for at least a year He is still very unmotivated to take care of his diabetes although because of his foot ulcer he is now interested in getting back on proper treatment Since he has not checked his  blood sugar at home is difficult to assess his dosage of insulin, both basal and mealtime Also since he has generally lost weight may not need to be on oral hypoglycemic drugs; consider using metformin  Recommendations made today:  To start back on NovoLog insulin with each meal    Adjust Lantus insulin based on fasting blood sugar trend. Given him flowsheet to do so. Meanwhile will change his dosage to the morning for better coverage of his daytime hyperglycemia  Start glucose monitoring regularly including at least some of her readings after meals  To check urine microalbumin on the next visit  Hyperlipidemia: Will need to discuss resuming his Pravachol on his next visit. Has had history of long-standing hypercholesterolemia  Gregory May 09/23/2013, 9:48 AM

## 2013-09-25 DIAGNOSIS — E7800 Pure hypercholesterolemia, unspecified: Secondary | ICD-10-CM | POA: Insufficient documentation

## 2013-09-25 DIAGNOSIS — E118 Type 2 diabetes mellitus with unspecified complications: Secondary | ICD-10-CM | POA: Insufficient documentation

## 2013-10-02 ENCOUNTER — Telehealth: Payer: Self-pay | Admitting: *Deleted

## 2013-10-02 NOTE — Telephone Encounter (Signed)
Pt came in today

## 2013-10-05 ENCOUNTER — Telehealth: Payer: Self-pay | Admitting: Internal Medicine

## 2013-10-05 NOTE — Telephone Encounter (Signed)
Patient would like to get in contact with doctor Izola PriceMyers about his FMLA paperwork. [facility] not [facility] at [facility]. Patient will be a new patient.

## 2013-10-14 ENCOUNTER — Ambulatory Visit (INDEPENDENT_AMBULATORY_CARE_PROVIDER_SITE_OTHER): Payer: Managed Care, Other (non HMO) | Admitting: Endocrinology

## 2013-10-14 ENCOUNTER — Encounter: Payer: Self-pay | Admitting: Endocrinology

## 2013-10-14 VITALS — BP 118/70 | HR 84 | Temp 98.1°F | Resp 16 | Ht 72.0 in | Wt 190.4 lb

## 2013-10-14 DIAGNOSIS — IMO0001 Reserved for inherently not codable concepts without codable children: Secondary | ICD-10-CM

## 2013-10-14 DIAGNOSIS — B351 Tinea unguium: Secondary | ICD-10-CM

## 2013-10-14 DIAGNOSIS — E7800 Pure hypercholesterolemia, unspecified: Secondary | ICD-10-CM

## 2013-10-14 DIAGNOSIS — E1165 Type 2 diabetes mellitus with hyperglycemia: Principal | ICD-10-CM

## 2013-10-14 MED ORDER — PRAVASTATIN SODIUM 40 MG PO TABS: 40.0000 mg | ORAL_TABLET | Freq: Every day | ORAL | Status: DC

## 2013-10-14 NOTE — Patient Instructions (Addendum)
Please check blood sugars at least some of the time about 2 hours after any meal and as directed on waking up. Please bring blood sugar monitor to each visit  Novolog 4-6 with Bfst or lunch

## 2013-10-14 NOTE — Progress Notes (Signed)
Gregory May   Reason for Appointment: Diabetes follow-up   History of Present Illness   Diagnosis: Type 2 DIABETES MELITUS, date of diagnosis:  1999     Previous history: He has previously been treated with metformin and Victoza and subsequently mealtime insulin added to control postprandial hyperglycemia Overall he has been  relatively noncompliant with his diet, medications, monitoring and followup Previously would not take Victoza regularly because of cost. Has not been taking any medications for the last year and a half because of commitments to family and cost A1c had increased to 12.3% and hyperglycemia discovered when he was hospitalized for foot ulcer. Also lost 20 pounds because of hyperglycemia   Recent history:  His blood sugars appear to be improving with starting basal bolus insulin regimen in 2/15 Has not taken any other treatment for his diabetes However he is checking his blood sugars mostly before meals He was told to start NovoLog at every meal but is doing this mostly at suppertime Not clear if his blood sugars are higher after his breakfast or lunch Does have some very high readings and evenings either from eating a high fat meal or forgetting insulin before eating     Insulin regimen: Lantus: 25   Novolog 10 units      Proper timing of injections in relation to meals:  not always.    Oral hypoglycemic drugs: None        Side effects from medications: None       Monitors blood glucose: Once a day.    Glucometer: One Touch.          Blood Glucose readings  PREMEAL Breakfast Lunch Dinner Bedtime Overall  Glucose range:  78-217  ?   93-163   84-263    Mean/median:  125      138    Meals: 3 meals per day.          Physical activity: exercise: None           Weight control:   Wt Readings from Last 3 Encounters:  10/14/13 190 lb 6.4 oz (86.365 kg)  09/23/13 186 lb 9.6 oz (84.641 kg)  09/15/13 195 lb (88.451 kg)          Complications: Diabetic foot  ulcer. Not healed yet    Diabetes labs:  Lab Results  Component Value Date   HGBA1C 12.3* 09/15/2013   Lab Results  Component Value Date   CREATININE 0.82 09/20/2013       Medication List       This list is accurate as of: 10/14/13 11:48 AM.  Always use your most recent med list.               hydrocerin Crea  Apply 1 application topically daily.     ibuprofen 200 MG tablet  Commonly known as:  ADVIL,MOTRIN  Take 400 mg by mouth every 6 (six) hours as needed (pain).     insulin aspart 100 UNIT/ML FlexPen  Commonly known as:  NOVOLOG FLEXPEN  Inject 7 units at breakfast and lunch and 10 units at dinner     insulin glargine 100 UNIT/ML injection  Commonly known as:  LANTUS  Inject 0.25 mLs (25 Units total) into the skin at bedtime.     oxyCODONE 5 MG immediate release tablet  Commonly known as:  Oxy IR/ROXICODONE  Take 1 tablet (5 mg total) by mouth every 4 (four) hours as needed for moderate pain.  sulfamethoxazole-trimethoprim 800-160 MG per tablet  Commonly known as:  BACTRIM DS  Take 1 tablet by mouth every 12 (twelve) hours.        Allergies: No Known Allergies  Past Medical History  Diagnosis Date  . Sebaceous cyst     on back of neck  . Diabetes mellitus     type II    Past Surgical History  Procedure Laterality Date  . Rotator cuff repair  2005 (approx)    right   . Closed reduction with humer pin insertion  1974    left hip  . Joint replacement  2006 (approx)    right hip replaced    Family History  Problem Relation Age of Onset  . Cancer Mother     breast, colon, liver    Social History:  reports that he has never smoked. He has never used smokeless tobacco. He reports that he drinks alcohol. He reports that he does not use illicit drugs.  Review of Systems:  Hypertension:  not present  Lipids: He was previously on Pravachol with good control of LDL, Currently not on anything  No results found for this basename: CHOL,  HDL,   LDLCALC,  LDLDIRECT,  TRIG,  CHOLHDL      LABS:  Pending   Examination:   BP 118/70  Pulse 84  Temp(Src) 98.1 F (36.7 C)  Resp 16  Ht 6' (1.829 m)  Wt 190 lb 6.4 oz (86.365 kg)  BMI 25.82 kg/m2  SpO2 94%  Body mass index is 25.82 kg/(m^2).    ASSESSMENT/ PLAN::    Diabetes type 2:  Blood glucose control has been somewhat better with adding basal bolus insulin However he has several readings over 200 related to inconsistent diet and insulin regimen before his main meal Since he has not checked blood sugars after his breakfast and lunch not clear if he still has hyperglycemia after these meals Most likely since he has had marked hyperglycemia he has minimal endogenous insulin secretion at this time and discussed this with him today Fasting blood sugars are upper normal with current regimen of 25 units Lantus  Recommendations made today:  To  use  NovoLog insulin with each meal and smaller doses before breakfast and lunch or if eating less carbohydrate    Continue same dose of Lantus  Start glucose monitoring regularly  after meals During the day.also   To  restart pravastatin for hyperlipidemia and cardiovascular prophylaxis because of his age and duration of diabetes  Will start Lamisil if liver functions are normal for his marked onychomycosis and tinea pedis  Counseling time over 50% of today's 25 minute visit   Marchel Foote 10/14/2013, 11:48 AM

## 2013-10-16 ENCOUNTER — Ambulatory Visit: Payer: Managed Care, Other (non HMO) | Admitting: *Deleted

## 2013-10-19 ENCOUNTER — Telehealth: Payer: Self-pay | Admitting: Endocrinology

## 2013-10-19 ENCOUNTER — Other Ambulatory Visit: Payer: Self-pay | Admitting: *Deleted

## 2013-10-19 NOTE — Telephone Encounter (Signed)
Pt is having an issue with a Rx, stating the insurance will not cover   Can you please advise pt In addition he is concerned about start Lamasil???  Call back: [phone]  Thank You :)

## 2013-10-27 ENCOUNTER — Inpatient Hospital Stay: Payer: Managed Care, Other (non HMO)

## 2013-11-05 ENCOUNTER — Ambulatory Visit: Payer: Managed Care, Other (non HMO) | Attending: Internal Medicine | Admitting: Internal Medicine

## 2013-11-05 VITALS — BP 134/83 | HR 80 | Temp 98.4°F | Resp 16

## 2013-11-05 DIAGNOSIS — M25559 Pain in unspecified hip: Secondary | ICD-10-CM | POA: Insufficient documentation

## 2013-11-05 DIAGNOSIS — E119 Type 2 diabetes mellitus without complications: Secondary | ICD-10-CM | POA: Insufficient documentation

## 2013-11-05 DIAGNOSIS — L0291 Cutaneous abscess, unspecified: Secondary | ICD-10-CM | POA: Insufficient documentation

## 2013-11-05 DIAGNOSIS — L0390 Cellulitis, unspecified: Secondary | ICD-10-CM

## 2013-11-05 DIAGNOSIS — Z96649 Presence of unspecified artificial hip joint: Secondary | ICD-10-CM | POA: Insufficient documentation

## 2013-11-05 DIAGNOSIS — Z794 Long term (current) use of insulin: Secondary | ICD-10-CM | POA: Insufficient documentation

## 2013-11-05 DIAGNOSIS — Z23 Encounter for immunization: Secondary | ICD-10-CM

## 2013-11-05 DIAGNOSIS — Z79899 Other long term (current) drug therapy: Secondary | ICD-10-CM | POA: Insufficient documentation

## 2013-11-05 DIAGNOSIS — E7800 Pure hypercholesterolemia, unspecified: Secondary | ICD-10-CM | POA: Insufficient documentation

## 2013-11-05 LAB — LIPID PANEL
Cholesterol: 155 mg/dL (ref 0–200)
HDL: 46 mg/dL (ref >39)
LDL Cholesterol: 98 mg/dL (ref 0–99)
Total CHOL/HDL Ratio: 3.4 Ratio
Triglycerides: 53 mg/dL (ref <150)
VLDL: 11 mg/dL (ref 0–40)

## 2013-11-05 LAB — TSH: TSH: 1.889 u[IU]/mL (ref 0.350–4.500)

## 2013-11-05 NOTE — Progress Notes (Signed)
Patient here for follow up from hospital Has wound to his heel on right foot Currently on antibiotics-

## 2013-11-05 NOTE — Progress Notes (Signed)
Patient ID: Gregory May, male   DOB: 1961/02/21, 53 y.o.   MRN: [id number]   CC:  HPI: 53 year old male with history of poorly controlled diabetes, history of nonadherence because of financial difficulty, presents to the ER at [facility] on 09/15/13 with throbbing right heel pain. Noted to have a diabetic foot ulcer in the setting of uncontrolled diabetes with an A1c of 12.3. MRI of the foot did not show osteomyelitis. Patient received 6 doses of vancomycin and Zosyn followed by Bactrim. Post discharge the patient was restarted on antibiotics 5 days ago, doxycycline prescribed by Dr. Lajoyce Cornersduda, due to recurrence drainage from the right heel. Patient has since been seen by Dr. Lucianne MussKumar who is the patient's endocrinologist. He has been switched to Lantus and NovoLog. Patient states that he takes NovoLog once or twice a day depending on how much he eats. He claims that his CBG ranges between 96 - 150  Social history He is a nonsmoker and drinks socially Family history positive for colon cancer and breast cancer in the mother she was also a diabetic Father had poorly controlled diabetes as well  No Known Allergies Past Medical History  Diagnosis Date  . Sebaceous cyst     on back of neck  . Diabetes mellitus     type II   Current Outpatient Prescriptions on File Prior to Visit  Medication Sig Dispense Refill  . hydrocerin (EUCERIN) CREA Apply 1 application topically daily.  113 g  1  . ibuprofen (ADVIL,MOTRIN) 200 MG tablet Take 400 mg by mouth every 6 (six) hours as needed (pain).      . insulin aspart (NOVOLOG FLEXPEN) 100 UNIT/ML FlexPen Inject 7 units at breakfast and lunch and 10 units at dinner  15 mL  3  . insulin glargine (LANTUS) 100 UNIT/ML injection Inject 0.25 mLs (25 Units total) into the skin at bedtime.  10 mL  11  . oxyCODONE (OXY IR/ROXICODONE) 5 MG immediate release tablet Take 1 tablet (5 mg total) by mouth every 4 (four) hours as needed for moderate pain.  65 tablet  0  .  pravastatin (PRAVACHOL) 40 MG tablet Take 1 tablet (40 mg total) by mouth daily.  30 tablet  3  . sulfamethoxazole-trimethoprim (BACTRIM DS) 800-160 MG per tablet Take 1 tablet by mouth every 12 (twelve) hours.  28 tablet  0   No current facility-administered medications on file prior to visit.   Family History  Problem Relation Age of Onset  . Cancer Mother     breast, colon, liver   History   Social History  . Marital Status: Single    Spouse Name: N/A    Number of Children: N/A  . Years of Education: N/A   Occupational History  . Not on file.   Social History Main Topics  . Smoking status: Never Smoker   . Smokeless tobacco: Never Used  . Alcohol Use: Yes     Comment: occasional  . Drug Use: No  . Sexual Activity: Not on file   Other Topics Concern  . Not on file   Social History Narrative  . No narrative on file    Review of Systems  Constitutional: Negative for fever, chills, diaphoresis, activity change, appetite change and fatigue.  HENT: Negative for ear pain, nosebleeds, congestion, facial swelling, rhinorrhea, neck pain, neck stiffness and ear discharge.   Eyes: Negative for pain, discharge, redness, itching and visual disturbance.  Respiratory: Negative for cough, choking, chest tightness, shortness of breath,  wheezing and stridor.   Cardiovascular: Negative for chest pain, palpitations and leg swelling.  Gastrointestinal: Negative for abdominal distention.  Genitourinary: Negative for dysuria, urgency, frequency, hematuria, flank pain, decreased urine volume, difficulty urinating and dyspareunia.  Musculoskeletal: Negative for back pain, joint swelling, arthralgias and gait problem.  Neurological: Negative for dizziness, tremors, seizures, syncope, facial asymmetry, speech difficulty, weakness, light-headedness, numbness and headaches.  Hematological: Negative for adenopathy. Does not bruise/bleed easily.  Psychiatric/Behavioral: Negative for hallucinations,  behavioral problems, confusion, dysphoric mood, decreased concentration and agitation.    Objective:   Filed Vitals:   11/05/13 1428  BP: 134/83  Pulse: 80  Temp: 98.4 F (36.9 C)  Resp: 16    Physical Exam  Constitutional: Appears well-developed and well-nourished. No distress.  HENT: Normocephalic. External right and left ear normal. Oropharynx is clear and moist.  Eyes: Conjunctivae and EOM are normal. PERRLA, no scleral icterus.  Neck: Normal ROM. Neck supple. No JVD. No tracheal deviation. No thyromegaly.  CVS: RRR, S1/S2 +, no murmurs, no gallops, no carotid bruit.  Pulmonary: Effort and breath sounds normal, no stridor, rhonchi, wheezes, rales.  Abdominal: Soft. BS +,  no distension, tenderness, rebound or guarding.  Musculoskeletal right heel ulcer covered with a dressing, no drainage or erythema appreciated  Lymphadenopathy: No lymphadenopathy noted, cervical, inguinal. Neuro: Alert. Normal reflexes, muscle tone coordination. No cranial nerve deficit. Skin: Skin is warm and dry. No rash noted. Not diaphoretic. No erythema. No pallor.  Psychiatric: Normal mood and affect. Behavior, judgment, thought content normal.   Lab Results  Component Value Date   WBC 8.5 09/20/2013   HGB 13.9 09/20/2013   HCT 41.6 09/20/2013   MCV 91.8 09/20/2013   PLT 329 09/20/2013   Lab Results  Component Value Date   CREATININE 0.82 09/20/2013   BUN 11 09/20/2013   NA 140 09/20/2013   K 4.1 09/20/2013   CL 102 09/20/2013   CO2 29 09/20/2013    Lab Results  Component Value Date   HGBA1C 12.3* 09/15/2013   Lipid Panel  No results found for this basename: chol, trig, hdl, cholhdl, vldl, ldlcalc       Assessment and plan:   Patient Active Problem List   Diagnosis Date Noted  . Onychomycosis 10/14/2013  . Pure hypercholesterolemia 09/25/2013  . Type II or unspecified type diabetes mellitus without mention of complication, uncontrolled 09/25/2013  . Diabetic foot ulcer associated with type 2 diabetes  mellitus 09/15/2013  . Poorly controlled type 2 diabetes mellitus with complication 09/15/2013  . Cellulitis 09/15/2013  . Diabetic foot ulcer 09/15/2013  . Sebaceous cyst 08/16/2011  . Infected sebaceous cyst 08/10/2011   Diabetes Followed by endocrinology Patient to discuss with endocrinology if he should be switched to NPH 70/30 because of financial issues 1 prescription for cost him $90   Cellulitis Currently on doxycycline for another 5 days He is a follow up appointment with Dr. Lajoyce Corners in 2 weeks He also complains of left hip pain he status post right hip replacement by Dr. Eulah Pont He is to discuss his left hip pain with Dr. Lajoyce Corners   Establish care Needs a colonoscopy because of positive family history, GI referral provided Tetanus booster to be given today Received flu vaccine vaccination in the hospital  Patient to followup in 2 months Patient is to pick up his FMLA paperwork tomorrow      The patient was given clear instructions to go to ER or return to medical center if symptoms don't improve, worsen  or new problems develop. The patient verbalized understanding. The patient was told to call to get any lab results if not heard anything in the next week.

## 2013-11-06 ENCOUNTER — Inpatient Hospital Stay: Payer: Managed Care, Other (non HMO)

## 2013-11-06 ENCOUNTER — Telehealth: Payer: Self-pay | Admitting: Emergency Medicine

## 2013-11-06 ENCOUNTER — Telehealth: Payer: Self-pay | Admitting: Internal Medicine

## 2013-11-06 LAB — VITAMIN D 25 HYDROXY (VIT D DEFICIENCY, FRACTURES): Vit D, 25-Hydroxy: 16 ng/mL — ABNORMAL LOW (ref 30–89)

## 2013-11-06 MED ORDER — VITAMIN D (ERGOCALCIFEROL) 1.25 MG (50000 UNIT) PO CAPS: 50000.0000 [IU] | ORAL_CAPSULE | ORAL | Status: DC

## 2013-11-06 NOTE — Telephone Encounter (Signed)
Left message for pt to call clinic

## 2013-11-06 NOTE — Telephone Encounter (Signed)
Pt was notify to pick up paper from labor department. Pt is aware.   [employer]

## 2013-11-06 NOTE — Telephone Encounter (Signed)
Message copied by Darlis LoanSMITH, JILL D on Fri Nov 06, 2013  5:29 PM ------      Message from: Susie CassetteABROL MD, Germain OsgoodNAYANA      Created: Fri Nov 06, 2013 11:00 AM       Notify patient that thyroid and cholesterol are normal vitamin D is low. [facility] ------

## 2013-11-10 ENCOUNTER — Ambulatory Visit: Payer: Managed Care, Other (non HMO) | Admitting: Home Health Services

## 2013-11-25 ENCOUNTER — Ambulatory Visit (INDEPENDENT_AMBULATORY_CARE_PROVIDER_SITE_OTHER): Payer: Managed Care, Other (non HMO) | Admitting: Endocrinology

## 2013-11-25 ENCOUNTER — Encounter: Payer: Self-pay | Admitting: Endocrinology

## 2013-11-25 VITALS — BP 122/70 | HR 90 | Temp 98.1°F | Resp 16 | Ht 72.0 in | Wt 183.2 lb

## 2013-11-25 DIAGNOSIS — IMO0001 Reserved for inherently not codable concepts without codable children: Secondary | ICD-10-CM

## 2013-11-25 DIAGNOSIS — E1165 Type 2 diabetes mellitus with hyperglycemia: Principal | ICD-10-CM

## 2013-11-25 DIAGNOSIS — E7800 Pure hypercholesterolemia, unspecified: Secondary | ICD-10-CM

## 2013-11-25 NOTE — Patient Instructions (Signed)
Please check blood sugars at least half the time about 2 hours after any meal and as directed on waking up. Please bring blood sugar monitor to each visit  Fill Pravastatin

## 2013-11-25 NOTE — Progress Notes (Signed)
Gregory May   Reason for Appointment: Diabetes follow-up   History of Present Illness   Diagnosis: Type 2 DIABETES MELITUS, date of diagnosis:  1999     Previous history: He has previously been treated with metformin and Victoza and subsequently mealtime insulin added to control postprandial hyperglycemia Overall he has been  relatively noncompliant with his diet, medications, monitoring and followup Previously would not take Victoza regularly because of cost. Has not been taking any medications for the last year and a half because of commitments to family and cost A1c had increased to 12.3% and hyperglycemia discovered when he was hospitalized for foot ulcer. Also lost 20 pounds because of hyperglycemia   Recent history:  His blood sugars appear to be significantly better since his last visit He has been very particular with his diet and has lost 7 pounds. Has been much more motivated since he developed his foot ulcer. Also was asked to take insulin with all his meals and is taking 7 units with breakfast  Also with cutting back on carbohydrate and high fat foods his blood sugars are much lower after his evening meal also Previously was having readings as high as 263 and recently highest glucose is only 124, including after meals Is not on any other medications for his diabetes with his insulin     Insulin regimen: Lantus: 25 units   Novolog 7-9 units a.c.       Proper timing of injections in relation to meals:  not always.    Oral hypoglycemic drugs: None        Side effects from medications: None       Monitors blood glucose: Once a day.    Glucometer: One Touch.          Blood Glucose readings   PREMEAL Breakfast Lunch Dinner  late p.m.  Overall  Glucose range:  92   118, 101   70-120   98-124    Mean/median:    100    102    Hypoglycemia: None, felt shaky once with glucose 70  Meals:  usually 2 meals per day.  breakfast at 12noon, evening meal 7 pm         Physical  activity: exercise: Some activity at current job           Weight control:   Wt Readings from Last 3 Encounters:  11/25/13 183 lb 3.2 oz (83.099 kg)  10/14/13 190 lb 6.4 oz (86.365 kg)  09/23/13 186 lb 9.6 oz (84.641 kg)          Complications: Diabetic foot ulcer  Diabetes labs:  Lab Results  Component Value Date   HGBA1C 12.3* 09/15/2013   Lab Results  Component Value Date   LDLCALC 98 11/05/2013   CREATININE 0.82 09/20/2013       Medication List       This list is accurate as of: 11/25/13 11:28 AM.  Always use your most recent med list.               doxycycline 100 MG DR capsule  Commonly known as:  DORYX  Take 100 mg by mouth 2 (two) times daily.     hydrocerin Crea  Apply 1 application topically daily.     ibuprofen 200 MG tablet  Commonly known as:  ADVIL,MOTRIN  Take 400 mg by mouth every 6 (six) hours as needed (pain).     insulin aspart 100 UNIT/ML FlexPen  Commonly known as:  NOVOLOG  FLEXPEN  Inject 7 units at breakfast and lunch and 10 units at dinner     insulin glargine 100 UNIT/ML injection  Commonly known as:  LANTUS  Inject 25 Units into the skin every other day.     mupirocin ointment 2 %  Commonly known as:  BACTROBAN     oxyCODONE 5 MG immediate release tablet  Commonly known as:  Oxy IR/ROXICODONE  Take 1 tablet (5 mg total) by mouth every 4 (four) hours as needed for moderate pain.     pravastatin 40 MG tablet  Commonly known as:  PRAVACHOL  Take 1 tablet (40 mg total) by mouth daily.     sulfamethoxazole-trimethoprim 800-160 MG per tablet  Commonly known as:  BACTRIM DS  Take 1 tablet by mouth every 12 (twelve) hours.     Vitamin D (Ergocalciferol) 50000 UNITS Caps capsule  Commonly known as:  DRISDOL  Take 1 capsule (50,000 Units total) by mouth every 7 (seven) days.        Allergies: No Known Allergies  Past Medical History  Diagnosis Date  . Sebaceous cyst     on back of neck  . Diabetes mellitus     type II     Past Surgical History  Procedure Laterality Date  . Rotator cuff repair  2005 (approx)    right   . Closed reduction with humer pin insertion  1974    left hip  . Joint replacement  2006 (approx)    right hip replaced    Family History  Problem Relation Age of Onset  . Cancer Mother     breast, colon, liver    Social History:  reports that he has never smoked. He has never used smokeless tobacco. He reports that he drinks alcohol. He reports that he does not use illicit drugs.  Review of Systems:  Hypertension:  not present  Lipids: He is on Pravachol with good control of LDL    Lab Results  Component Value Date   CHOL 155 11/05/2013   HDL 46 11/05/2013   LDLCALC 98 11/05/2013   TRIG 53 11/05/2013   CHOLHDL 3.4 11/05/2013    Continues to be followed by his orthopedic surgeon for foot ulcer which has not healed completely   Examination:   BP 122/70  Pulse 90  Temp(Src) 98.1 F (36.7 C)  Resp 16  Ht 6' (1.829 m)  Wt 183 lb 3.2 oz (83.099 kg)  BMI 24.84 kg/m2  SpO2 94%  Body mass index is 24.84 kg/(m^2).    ASSESSMENT/ PLAN:    Diabetes type 2:  Blood glucose control has been significantly better with improving his diet and using basal bolus insulin He has also lost significant amount of weight and most of the blood sugars are normal without hypoglycemia Fasting blood sugars are not checked much but today glucose 92 with current regimen of 25 units Lantus  Recommendations made today: Continue same insulin regimen May need to reduce suppertime coverage by 1-2 units as postprandial readings are low normal However may continue other doses especially with his new job been not as active  Foot ulcer: Continue followup with orthopedic surgeon  Hyperlipidemia: Well controlled    [name] 11/25/2013, 11:28 AM

## 2013-11-26 ENCOUNTER — Telehealth: Payer: Self-pay | Admitting: *Deleted

## 2013-11-26 NOTE — Telephone Encounter (Signed)
LVM for patient to call back to inquire about the FMLA paperwork he had called about. Wanting to find out if he has come by and picked it up.

## 2014-01-06 ENCOUNTER — Encounter: Payer: Self-pay | Admitting: Endocrinology

## 2014-01-06 ENCOUNTER — Encounter: Payer: Self-pay | Admitting: Internal Medicine

## 2014-01-07 ENCOUNTER — Ambulatory Visit: Payer: Managed Care, Other (non HMO) | Admitting: Internal Medicine

## 2014-01-20 ENCOUNTER — Other Ambulatory Visit: Payer: Managed Care, Other (non HMO)

## 2014-01-20 ENCOUNTER — Other Ambulatory Visit (INDEPENDENT_AMBULATORY_CARE_PROVIDER_SITE_OTHER): Payer: Managed Care, Other (non HMO)

## 2014-01-20 DIAGNOSIS — IMO0001 Reserved for inherently not codable concepts without codable children: Secondary | ICD-10-CM

## 2014-01-20 DIAGNOSIS — E1165 Type 2 diabetes mellitus with hyperglycemia: Principal | ICD-10-CM

## 2014-01-20 LAB — COMPREHENSIVE METABOLIC PANEL
ALT: 30 U/L (ref 0–53)
AST: 26 U/L (ref 0–37)
Albumin: 3.4 g/dL — ABNORMAL LOW (ref 3.5–5.2)
Alkaline Phosphatase: 94 U/L (ref 39–117)
BUN: 14 mg/dL (ref 6–23)
CO2: 31 mEq/L (ref 19–32)
Calcium: 9.2 mg/dL (ref 8.4–10.5)
Chloride: 100 mEq/L (ref 96–112)
Creatinine, Ser: 0.9 mg/dL (ref 0.4–1.5)
GFR: 99.53 mL/min (ref >60.00)
Glucose, Bld: 124 mg/dL — ABNORMAL HIGH (ref 70–99)
Potassium: 4.2 mEq/L (ref 3.5–5.1)
Sodium: 137 mEq/L (ref 135–145)
Total Bilirubin: 1 mg/dL (ref 0.2–1.2)
Total Protein: 6.8 g/dL (ref 6.0–8.3)

## 2014-01-20 LAB — MICROALBUMIN / CREATININE URINE RATIO
Creatinine,U: 157.1 mg/dL
Microalb Creat Ratio: 1.2 mg/g (ref 0.0–30.0)
Microalb, Ur: 1.9 mg/dL (ref 0.0–1.9)

## 2014-01-20 LAB — HEMOGLOBIN A1C: Hgb A1c MFr Bld: 6.5 % (ref 4.6–6.5)

## 2014-01-25 ENCOUNTER — Ambulatory Visit (INDEPENDENT_AMBULATORY_CARE_PROVIDER_SITE_OTHER): Payer: Managed Care, Other (non HMO) | Admitting: Endocrinology

## 2014-01-25 ENCOUNTER — Encounter: Payer: Self-pay | Admitting: Endocrinology

## 2014-01-25 VITALS — BP 130/78 | HR 83 | Temp 98.1°F | Resp 16 | Ht 72.0 in | Wt 184.6 lb

## 2014-01-25 DIAGNOSIS — E118 Type 2 diabetes mellitus with unspecified complications: Secondary | ICD-10-CM

## 2014-01-25 DIAGNOSIS — E1165 Type 2 diabetes mellitus with hyperglycemia: Secondary | ICD-10-CM

## 2014-01-25 NOTE — Patient Instructions (Addendum)
Lantus 20 daily at supper   Please check blood sugars at least half the time about 2 hours after any meal and times per week on waking up. Please bring blood sugar monitor to each visit  Wrist brace for Rt hand

## 2014-01-25 NOTE — Progress Notes (Signed)
Gregory May 53 y.o.    Reason for Appointment: Diabetes follow-up   History of Present Illness   Diagnosis: Type 2 DIABETES MELITUS, date of diagnosis:  1999     Previous history: He has previously been treated with metformin and Victoza and subsequently mealtime insulin added to control postprandial hyperglycemia Overall he has been  relatively noncompliant with his diet, medications, monitoring and followup Previously would not take Victoza regularly because of cost. Has not been taking any medications for the last year and a half because of commitments to family and cost A1c had increased to 12.3% and hyperglycemia discovered when he was hospitalized for foot ulcer. Also lost 20 pounds because of hyperglycemia   Recent history:  His blood sugars appear to be significantly better since about 10/2013 He has been careful  with his diet and maintaining his weight loss  Again as been much more motivated since he developed his foot ulcer take better care of his diabetes and monitor his sugar. He has not changes insulin regimen since his last visit and is taking mealtime coverage with all his meals However he says that he forgets to take his Lantus sometimes at night and may sometimes not take it every other day He thinks his blood sugars are not higher as a result However his blood sugars not being checked fasting consistently Most of his blood sugars are in the afternoons and not consistently checked postprandial readings However his A1c is excellent now and the rest in a long time Is not on any other medications for his diabetes with his insulin     Insulin regimen: Lantus: 25 units at bedtime   Novolog 7-9 units a.c.       Proper timing of injections in relation to meals:  not always.    Oral hypoglycemic drugs: None        Side effects from medications: None       Monitors blood glucose: Once a day.    Glucometer: One Touch.          Blood Glucose readings   PREMEAL  Breakfast  3-5 PM   5-8 PM  Bedtime Overall  Glucose range: 104-123   92-136   59-159   91- 120    Mean/median:      116   Hypoglycemia:  rare, felt shaky once with glucose 59  Meals:  usually 2 meals per day.  breakfast at 12 noon, evening meal 7 pm         Physical activity: exercise:None recently            Weight control:   Wt Readings from Last 3 Encounters:  01/25/14 184 lb 9.6 oz (83.734 kg)  11/25/13 183 lb 3.2 oz (83.099 kg)  10/14/13 190 lb 6.4 oz (86.365 kg)          Complications: Diabetic foot ulcer, Now healed  Diabetes labs:  Lab Results  Component Value Date   HGBA1C 6.5 01/20/2014   HGBA1C 12.3* 09/15/2013   Lab Results  Component Value Date   MICROALBUR 1.9 01/20/2014   LDLCALC 98 11/05/2013   CREATININE 0.9 01/20/2014       Medication List       This list is accurate as of: 01/25/14  4:05 PM.  Always use your most recent med list.               doxycycline 100 MG DR capsule  Commonly known as:  DORYX  Take 100 mg by  mouth 2 (two) times daily.     hydrocerin Crea  Apply 1 application topically daily.     ibuprofen 200 MG tablet  Commonly known as:  ADVIL,MOTRIN  Take 400 mg by mouth every 6 (six) hours as needed (pain).     insulin aspart 100 UNIT/ML FlexPen  Commonly known as:  NOVOLOG FLEXPEN  Inject 7 units at breakfast and lunch and 10 units at dinner     insulin glargine 100 UNIT/ML injection  Commonly known as:  LANTUS  Inject 25 Units into the skin every other day.     mupirocin ointment 2 %  Commonly known as:  BACTROBAN     oxyCODONE 5 MG immediate release tablet  Commonly known as:  Oxy IR/ROXICODONE  Take 1 tablet (5 mg total) by mouth every 4 (four) hours as needed for moderate pain.     pravastatin 40 MG tablet  Commonly known as:  PRAVACHOL  Take 1 tablet (40 mg total) by mouth daily.     RELION INSULIN SYR 0.3ML/31G 31G X 5/16" 0.3 ML Misc  Generic drug:  Insulin Syringe-Needle U-100      sulfamethoxazole-trimethoprim 800-160 MG per tablet  Commonly known as:  BACTRIM DS  Take 1 tablet by mouth every 12 (twelve) hours.     Vitamin D (Ergocalciferol) 50000 UNITS Caps capsule  Commonly known as:  DRISDOL  Take 1 capsule (50,000 Units total) by mouth every 7 (seven) days.        Allergies: No Known Allergies  Past Medical History  Diagnosis Date  . Sebaceous cyst     on back of neck  . Diabetes mellitus     type II    Past Surgical History  Procedure Laterality Date  . Rotator cuff repair  2005 (approx)    right   . Closed reduction with humer pin insertion  1974    left hip  . Joint replacement  2006 (approx)    right hip replaced    Family History  Problem Relation Age of Onset  . Cancer Mother     breast, colon, liver    Social History:  reports that he has never smoked. He has never used smokeless tobacco. He reports that he drinks alcohol. He reports that he does not use illicit drugs.  Review of Systems:  Hypertension:  not present  Lipids: He is on Pravachol with good control of LDL    Lab Results  Component Value Date   CHOL 155 11/05/2013   HDL 46 11/05/2013   LDLCALC 98 11/05/2013   TRIG 53 11/05/2013   CHOLHDL 3.4 11/05/2013    Continues to be followed by his orthopedic surgeon for foot ulcer which  has now healed completely  He is now having significant hip pain and apparently needs hip replacement   Examination:   BP 130/78  Pulse 83  Temp(Src) 98.1 F (36.7 C)  Resp 16  Ht 6' (1.829 m)  Wt 184 lb 9.6 oz (83.734 kg)  BMI 25.03 kg/m2  SpO2 96%  Body mass index is 25.03 kg/(m^2).    ASSESSMENT/ PLAN:    Diabetes type 2:  Blood glucose control has been  again well-controlled with improving his diet and using basal bolus insulin Surprisingly he does not appear to have higher readings even he misses his evening Lantus dose Is also fairly compliant with his mealtime coverage and only rarely has hypoglycemia with decreased  carbohydrate intake Able to maintain his weight Recently not very active  A1c is now in the normal range   Recommendations made today:  he can try taking 20 Lantus but do it at suppertime for better compliance May need to reduce suppertime coverage by 1-2 units more smaller meals   Hyperlipidemia: Well controlled as of 3/15    Ferlando Lia 01/25/2014, 4:05 PM

## 2014-01-27 ENCOUNTER — Telehealth: Payer: Self-pay | Admitting: Endocrinology

## 2014-01-27 NOTE — Telephone Encounter (Signed)
faxed

## 2014-01-27 NOTE — Telephone Encounter (Signed)
Please send A1c report

## 2014-01-27 NOTE — Telephone Encounter (Signed)
Please see below.

## 2014-01-27 NOTE — Telephone Encounter (Signed)
Dr. Reuel Boomaniel Murphy's office is waiting on A1C fax to clear for surgery   Thank You :)

## 2014-02-08 ENCOUNTER — Encounter (HOSPITAL_COMMUNITY): Payer: Self-pay | Admitting: Emergency Medicine

## 2014-02-08 ENCOUNTER — Inpatient Hospital Stay (HOSPITAL_COMMUNITY)
Admission: EM | Admit: 2014-02-08 | Discharge: 2014-02-10 | DRG: 176 | Disposition: A | Discharge status: Home / Self Care | Payer: Managed Care, Other (non HMO) | Attending: Internal Medicine | Admitting: Internal Medicine

## 2014-02-08 ENCOUNTER — Emergency Department (HOSPITAL_COMMUNITY): Payer: Managed Care, Other (non HMO)

## 2014-02-08 DIAGNOSIS — I82402 Acute embolism and thrombosis of unspecified deep veins of left lower extremity: Secondary | ICD-10-CM

## 2014-02-08 DIAGNOSIS — Z791 Long term (current) use of non-steroidal anti-inflammatories (NSAID): Secondary | ICD-10-CM

## 2014-02-08 DIAGNOSIS — M7989 Other specified soft tissue disorders: Secondary | ICD-10-CM

## 2014-02-08 DIAGNOSIS — E1169 Type 2 diabetes mellitus with other specified complication: Secondary | ICD-10-CM

## 2014-02-08 DIAGNOSIS — I82409 Acute embolism and thrombosis of unspecified deep veins of unspecified lower extremity: Secondary | ICD-10-CM | POA: Diagnosis present

## 2014-02-08 DIAGNOSIS — Z96649 Presence of unspecified artificial hip joint: Secondary | ICD-10-CM

## 2014-02-08 DIAGNOSIS — Z800 Family history of malignant neoplasm of digestive organs: Secondary | ICD-10-CM

## 2014-02-08 DIAGNOSIS — M169 Osteoarthritis of hip, unspecified: Secondary | ICD-10-CM | POA: Diagnosis present

## 2014-02-08 DIAGNOSIS — E785 Hyperlipidemia, unspecified: Secondary | ICD-10-CM | POA: Diagnosis present

## 2014-02-08 DIAGNOSIS — Z833 Family history of diabetes mellitus: Secondary | ICD-10-CM

## 2014-02-08 DIAGNOSIS — I824Y9 Acute embolism and thrombosis of unspecified deep veins of unspecified proximal lower extremity: Secondary | ICD-10-CM | POA: Diagnosis present

## 2014-02-08 DIAGNOSIS — M1612 Unilateral primary osteoarthritis, left hip: Secondary | ICD-10-CM

## 2014-02-08 DIAGNOSIS — Z832 Family history of diseases of the blood and blood-forming organs and certain disorders involving the immune mechanism: Secondary | ICD-10-CM

## 2014-02-08 DIAGNOSIS — L97529 Non-pressure chronic ulcer of other part of left foot with unspecified severity: Secondary | ICD-10-CM

## 2014-02-08 DIAGNOSIS — Z79899 Other long term (current) drug therapy: Secondary | ICD-10-CM

## 2014-02-08 DIAGNOSIS — E1149 Type 2 diabetes mellitus with other diabetic neurological complication: Secondary | ICD-10-CM | POA: Diagnosis present

## 2014-02-08 DIAGNOSIS — E118 Type 2 diabetes mellitus with unspecified complications: Secondary | ICD-10-CM | POA: Diagnosis present

## 2014-02-08 DIAGNOSIS — Z803 Family history of malignant neoplasm of breast: Secondary | ICD-10-CM

## 2014-02-08 DIAGNOSIS — E11621 Type 2 diabetes mellitus with foot ulcer: Secondary | ICD-10-CM

## 2014-02-08 DIAGNOSIS — E1165 Type 2 diabetes mellitus with hyperglycemia: Secondary | ICD-10-CM | POA: Diagnosis present

## 2014-02-08 DIAGNOSIS — E1142 Type 2 diabetes mellitus with diabetic polyneuropathy: Secondary | ICD-10-CM | POA: Diagnosis present

## 2014-02-08 DIAGNOSIS — Z794 Long term (current) use of insulin: Secondary | ICD-10-CM

## 2014-02-08 DIAGNOSIS — M1610 Unilateral primary osteoarthritis, unspecified hip: Secondary | ICD-10-CM | POA: Diagnosis present

## 2014-02-08 DIAGNOSIS — IMO0002 Reserved for concepts with insufficient information to code with codable children: Secondary | ICD-10-CM | POA: Diagnosis present

## 2014-02-08 DIAGNOSIS — IMO0001 Reserved for inherently not codable concepts without codable children: Secondary | ICD-10-CM

## 2014-02-08 DIAGNOSIS — I2699 Other pulmonary embolism without acute cor pulmonale: Principal | ICD-10-CM

## 2014-02-08 DIAGNOSIS — I824Z9 Acute embolism and thrombosis of unspecified deep veins of unspecified distal lower extremity: Secondary | ICD-10-CM | POA: Diagnosis present

## 2014-02-08 DIAGNOSIS — L97509 Non-pressure chronic ulcer of other part of unspecified foot with unspecified severity: Secondary | ICD-10-CM | POA: Diagnosis present

## 2014-02-08 DIAGNOSIS — L97519 Non-pressure chronic ulcer of other part of right foot with unspecified severity: Secondary | ICD-10-CM

## 2014-02-08 DIAGNOSIS — I10 Essential (primary) hypertension: Secondary | ICD-10-CM | POA: Diagnosis present

## 2014-02-08 DIAGNOSIS — M79609 Pain in unspecified limb: Secondary | ICD-10-CM

## 2014-02-08 HISTORY — DX: Acute embolism and thrombosis of unspecified deep veins of unspecified lower extremity: I82.409

## 2014-02-08 HISTORY — DX: Other pulmonary embolism without acute cor pulmonale: I26.99

## 2014-02-08 LAB — CBC WITH DIFFERENTIAL/PLATELET
Basophils Absolute: 0 10*3/uL (ref 0.0–0.1)
Basophils Relative: 0 % (ref 0–1)
Eosinophils Absolute: 0.2 10*3/uL (ref 0.0–0.7)
Eosinophils Relative: 2 % (ref 0–5)
HCT: 41.1 % (ref 39.0–52.0)
Hemoglobin: 13.8 g/dL (ref 13.0–17.0)
Lymphocytes Relative: 23 % (ref 12–46)
Lymphs Abs: 2.7 10*3/uL (ref 0.7–4.0)
MCH: 30.7 pg (ref 26.0–34.0)
MCHC: 33.6 g/dL (ref 30.0–36.0)
MCV: 91.5 fL (ref 78.0–100.0)
Monocytes Absolute: 1.3 10*3/uL — ABNORMAL HIGH (ref 0.1–1.0)
Monocytes Relative: 11 % (ref 3–12)
Neutro Abs: 7.5 10*3/uL (ref 1.7–7.7)
Neutrophils Relative %: 64 % (ref 43–77)
Platelets: 298 10*3/uL (ref 150–400)
RBC: 4.49 MIL/uL (ref 4.22–5.81)
RDW: 13.9 % (ref 11.5–15.5)
WBC: 11.6 10*3/uL — ABNORMAL HIGH (ref 4.0–10.5)

## 2014-02-08 LAB — BASIC METABOLIC PANEL
BUN: 10 mg/dL (ref 6–23)
CO2: 27 mEq/L (ref 19–32)
Calcium: 9 mg/dL (ref 8.4–10.5)
Chloride: 100 mEq/L (ref 96–112)
Creatinine, Ser: 0.76 mg/dL (ref 0.50–1.35)
GFR calc Af Amer: >90 mL/min (ref >90)
GFR calc non Af Amer: >90 mL/min (ref >90)
Glucose, Bld: 83 mg/dL (ref 70–99)
Potassium: 4 mEq/L (ref 3.7–5.3)
Sodium: 140 mEq/L (ref 137–147)

## 2014-02-08 LAB — CBG MONITORING, ED: Glucose-Capillary: 102 mg/dL — ABNORMAL HIGH (ref 70–99)

## 2014-02-08 LAB — TROPONIN I: Troponin I: <0.3 ng/mL (ref <0.30)

## 2014-02-08 LAB — GLUCOSE, CAPILLARY
Glucose-Capillary: 213 mg/dL — ABNORMAL HIGH (ref 70–99)
Glucose-Capillary: 105 mg/dL — ABNORMAL HIGH (ref 70–99)
Glucose-Capillary: 181 mg/dL — ABNORMAL HIGH (ref 70–99)

## 2014-02-08 LAB — HEPARIN LEVEL (UNFRACTIONATED): Heparin Unfractionated: 0.71 IU/mL — ABNORMAL HIGH (ref 0.30–0.70)

## 2014-02-08 LAB — PROTIME-INR
INR: 1.08 (ref 0.00–1.49)
Prothrombin Time: 14 seconds (ref 11.6–15.2)

## 2014-02-08 MED ORDER — INSULIN ASPART 100 UNIT/ML ~~LOC~~ SOLN
0.0000 [IU] | Freq: Every day | SUBCUTANEOUS | Status: DC
  Administered 2014-02-09: 2 [IU] via SUBCUTANEOUS

## 2014-02-08 MED ORDER — INSULIN ASPART 100 UNIT/ML ~~LOC~~ SOLN
0.0000 [IU] | Freq: Three times a day (TID) | SUBCUTANEOUS | Status: DC
  Administered 2014-02-09 (×2): 1 [IU] via SUBCUTANEOUS
  Administered 2014-02-09: 2 [IU] via SUBCUTANEOUS
  Administered 2014-02-10: 1 [IU] via SUBCUTANEOUS

## 2014-02-08 MED ORDER — SODIUM CHLORIDE 0.9 % IJ SOLN
3.0000 mL | Freq: Two times a day (BID) | INTRAMUSCULAR | Status: DC
  Administered 2014-02-09: 3 mL via INTRAVENOUS

## 2014-02-08 MED ORDER — HYDROMORPHONE HCL PF 1 MG/ML IJ SOLN: 1.0000 mg | INTRAMUSCULAR | Status: DC | PRN

## 2014-02-08 MED ORDER — OXYCODONE-ACETAMINOPHEN 5-325 MG PO TABS
2.0000 | ORAL_TABLET | Freq: Once | ORAL | Status: AC
  Administered 2014-02-08: 2 via ORAL
  Filled 2014-02-08: qty 2

## 2014-02-08 MED ORDER — INSULIN GLARGINE 100 UNIT/ML ~~LOC~~ SOLN
20.0000 [IU] | SUBCUTANEOUS | Status: DC
  Filled 2014-02-08: qty 0.2

## 2014-02-08 MED ORDER — HEPARIN (PORCINE) IN NACL 100-0.45 UNIT/ML-% IJ SOLN
1400.0000 [IU]/h | INTRAMUSCULAR | Status: DC
  Administered 2014-02-08 – 2014-02-09 (×2): 1250 [IU]/h via INTRAVENOUS
  Administered 2014-02-09: 1400 [IU]/h via INTRAVENOUS
  Filled 2014-02-08 (×3): qty 250

## 2014-02-08 MED ORDER — IBUPROFEN 800 MG PO TABS: 400.0000 mg | ORAL_TABLET | Freq: Four times a day (QID) | ORAL | Status: DC | PRN

## 2014-02-08 MED ORDER — HEPARIN BOLUS VIA INFUSION
4000.0000 [IU] | Freq: Once | INTRAVENOUS | Status: AC
  Administered 2014-02-08: 4000 [IU] via INTRAVENOUS

## 2014-02-08 MED ORDER — ENOXAPARIN SODIUM 80 MG/0.8ML ~~LOC~~ SOLN
80.0000 mg | Freq: Once | SUBCUTANEOUS | Status: AC
  Administered 2014-02-08: 80 mg via SUBCUTANEOUS
  Filled 2014-02-08: qty 0.8

## 2014-02-08 MED ORDER — SODIUM CHLORIDE 0.9 % IV SOLN
INTRAVENOUS | Status: AC
  Administered 2014-02-08: 12:00:00 via INTRAVENOUS

## 2014-02-08 MED ORDER — IOHEXOL 350 MG/ML SOLN
100.0000 mL | Freq: Once | INTRAVENOUS | Status: AC | PRN
  Administered 2014-02-08: 100 mL via INTRAVENOUS

## 2014-02-08 MED ORDER — OXYCODONE-ACETAMINOPHEN 5-325 MG PO TABS
1.0000 | ORAL_TABLET | ORAL | Status: DC | PRN
Start: 2014-02-08 — End: 2014-02-10
  Administered 2014-02-08 (×2): 2 via ORAL
  Administered 2014-02-09: 1 via ORAL
  Administered 2014-02-09 (×2): 2 via ORAL
  Administered 2014-02-09: 1 via ORAL
  Administered 2014-02-09 – 2014-02-10 (×4): 2 via ORAL
  Filled 2014-02-08 (×9): qty 2

## 2014-02-08 MED ORDER — ONDANSETRON HCL 4 MG/2ML IJ SOLN: 4.0000 mg | Freq: Four times a day (QID) | INTRAMUSCULAR | Status: DC | PRN

## 2014-02-08 MED ORDER — WARFARIN SODIUM 7.5 MG PO TABS
7.5000 mg | ORAL_TABLET | Freq: Once | ORAL | Status: AC
  Administered 2014-02-08: 7.5 mg via ORAL
  Filled 2014-02-08: qty 1

## 2014-02-08 MED ORDER — HEPARIN (PORCINE) IN NACL 100-0.45 UNIT/ML-% IJ SOLN
1350.0000 [IU]/h | INTRAMUSCULAR | Status: DC
  Administered 2014-02-08: 1350 [IU]/h via INTRAVENOUS
  Filled 2014-02-08 (×2): qty 250

## 2014-02-08 MED ORDER — OXYCODONE-ACETAMINOPHEN 5-325 MG PO TABS
1.0000 | ORAL_TABLET | Freq: Once | ORAL | Status: AC
  Administered 2014-02-08: 1 via ORAL
  Filled 2014-02-08: qty 1

## 2014-02-08 MED ORDER — SIMVASTATIN 10 MG PO TABS
10.0000 mg | ORAL_TABLET | Freq: Every day | ORAL | Status: DC
Start: 2014-02-08 — End: 2014-02-10
  Administered 2014-02-08 – 2014-02-09 (×2): 10 mg via ORAL
  Filled 2014-02-08 (×3): qty 1

## 2014-02-08 MED ORDER — MUPIROCIN 2 % EX OINT
1.0000 "application " | TOPICAL_OINTMENT | Freq: Every day | CUTANEOUS | Status: DC
  Administered 2014-02-08 – 2014-02-10 (×3): 1 via TOPICAL
  Filled 2014-02-08: qty 22

## 2014-02-08 MED ORDER — ONDANSETRON HCL 4 MG PO TABS: 4.0000 mg | ORAL_TABLET | Freq: Four times a day (QID) | ORAL | Status: DC | PRN

## 2014-02-08 MED ORDER — VITAMIN D (ERGOCALCIFEROL) 1.25 MG (50000 UNIT) PO CAPS: 50000.0000 [IU] | ORAL_CAPSULE | ORAL | Status: DC

## 2014-02-08 MED ORDER — OXYCODONE-ACETAMINOPHEN 5-325 MG PO TABS: 1.0000 | ORAL_TABLET | Freq: Once | ORAL | Status: DC

## 2014-02-08 MED ORDER — WARFARIN - PHARMACIST DOSING INPATIENT: Freq: Every day | Status: DC

## 2014-02-08 NOTE — H&P (Addendum)
[facility] History and Physical  Gregory May [name] DOB: 04-12-1961 DOA: 02/08/2014  Referring physician: ED physician PCP: Gregory LewandowskyJEGEDE, OLUGBEMIGA, May   Chief Complaint: left leg pain   HPI:  Patient is 53 year old male with history of diabetes and hypertension, presented to emergency department with main concern of sudden onset of left leg pain, throbbing and constant, radiating to the left groin area, 10 /10 in severity, no specific aggravating or alleviating symptoms. Patient explains that he has not been able to move much due to to persistent left hip pain. He denies chest pain but reports intermittent episodes of shortness of breath over the past several days. He denies similar events in the past, no recent trauma to the left leg, he denies numbness and tingling, no fevers or chills.  Emergency department, patient noted to have left lower extremity DVT, CT chest notable for pulmonary emboli.  Assessment and Plan: Active Problems:   Pulmonary emboli, acute  - admit to telemetry bed, place order for heparin per pharmacy - Also start bridging with Coumadin as per pharmacy - Provide analgesia as needed   DVT, acute LLE - doppler positive for an extensive deep vein thrombosis coursing from the ankle - posterior tibial, peroneal, through the popliteal, profunda, femoral and common femoral veins - Management with heparin and bridging with Coumadin per pharmacy   Diabetes mellitus with complications of neuropathy  - Continue home medical regimen - Placed on sliding scale insulin as well   Hypertension - Blood pressure stable on admission  Radiological Exams on Admission: Ct Angio Chest /29/2015   Positive for bilateral PE. RV/ LV ratio is calculated at 0.88, which is not considered increased.  Code Status: Full Family Communication: Pt at bedside Disposition Plan: Admit for further evaluation    Review of Systems:  Constitutional: Negative for fever, chills and  malaise/fatigue. Negative for diaphoresis.  HENT: Negative for hearing loss, ear pain, nosebleeds, congestion, sore throat, neck pain, tinnitus and ear discharge.   Eyes: Negative for blurred vision, double vision, photophobia, pain, discharge and redness.  Respiratory: Negative for cough, hemoptysis, sputum production, shortness of breath, wheezing and stridor.   Cardiovascular: Negative for chest pain, palpitations, orthopnea, claudication and leg swelling.  Gastrointestinal: Negative for nausea, vomiting and abdominal pain. Negative for heartburn, constipation, blood in stool and melena.  Genitourinary: Negative for dysuria, urgency, frequency, hematuria and flank pain.  Musculoskeletal: Negative for myalgias, back pain, joint pain and falls.  Skin: Negative for itching and rash.  Neurological: Negative for dizziness, speech change, focal weakness, loss of consciousness and headaches.  Endo/Heme/Allergies: Negative for environmental allergies and polydipsia. Does not bruise/bleed easily.  Psychiatric/Behavioral: Negative for suicidal ideas. The patient is not nervous/anxious.      Past Medical History  Diagnosis Date  . Sebaceous cyst     on back of neck  . Diabetes mellitus     type II    Past Surgical History  Procedure Laterality Date  . Rotator cuff repair  2005 (approx)    right   . Closed reduction with humer pin insertion  1974    left hip  . Joint replacement  2006 (approx)    right hip replaced    Social History:  reports that he has never smoked. He has never used smokeless tobacco. He reports that he drinks alcohol. He reports that he does not use illicit drugs.  No Known Allergies  Family History  Problem Relation Age of Onset  . Cancer Mother  breast, colon, liver  . Diabetes Father     Prior to Admission medications   Medication Sig Start Date End Date Taking? Authorizing Provider  bag balm OINT ointment Apply 1 application topically as needed for dry  skin.   Yes Historical Provider, May  ibuprofen (ADVIL,MOTRIN) 200 MG tablet Take 400 mg by mouth every 6 (six) hours as needed (pain).   Yes Historical Provider, May  insulin aspart (NOVOLOG FLEXPEN) 100 UNIT/ML FlexPen Inject 7 units at breakfast and lunch and 10 units at dinner 09/23/13  Yes Gregory May  insulin glargine (LANTUS) 100 UNIT/ML injection Inject 20 Units into the skin every other day.  09/21/13  Yes Gregory May  mupirocin ointment (BACTROBAN) 2 % Apply 1 application topically daily. Applied to affected heal 10/30/13  Yes Historical Provider, May  pravastatin (PRAVACHOL) 40 MG tablet Take 1 tablet (40 mg total) by mouth daily. 10/14/13  Yes Gregory May  Vitamin D, Ergocalciferol, (DRISDOL) 50000 UNITS CAPS capsule Take 1 capsule (50,000 Units total) by mouth every 7 (seven) days. 11/06/13  Yes Gregory May  RELION INSULIN SYR 0.3ML/31G 31G X 5/16" 0.3 ML MISC  12/07/13   Historical Provider, May    Physical Exam: Filed Vitals:   02/08/14 0914 02/08/14 1116 02/08/14 1135 02/08/14 1323  BP: 128/88 128/79 135/82 115/63  Pulse: 63 91 99 77  Temp:   98 F (36.7 C) 98 F (36.7 C)  TempSrc:   Oral Oral  Resp: 18 16  18   Height:   6' (1.829 m)   Weight:   82 kg (180 lb 12.4 oz)   SpO2: 96% 96% 98% 97%    Physical Exam  Constitutional: Appears well-developed and well-nourished. No distress.  HENT: Normocephalic. External right and left ear normal. Oropharynx is clear and moist.  Eyes: Conjunctivae and EOM are normal. PERRLA, no scleral icterus.  Neck: Normal ROM. Neck supple. No JVD. No tracheal deviation. No thyromegaly.  CVS: RRR, S1/S2 +, no murmurs, no gallops, no carotid bruit.  Pulmonary: Effort and breath sounds normal, no stridor, rhonchi, wheezes, rales.  Abdominal: Soft. BS +,  no distension, tenderness, rebound or guarding.  Musculoskeletal: Normal range of motion. Significant tenderness in the left calf area, mild swelling noted  Lymphadenopathy: No  lymphadenopathy noted, cervical, inguinal. Neuro: Alert. Normal reflexes, muscle tone coordination. No cranial nerve deficit. Skin: Skin is warm and dry. No rash noted. Not diaphoretic. No erythema. No pallor.  Psychiatric: Normal mood and affect. Behavior, judgment, thought content normal.   Labs on Admission:  Basic Metabolic Panel:  Recent Labs Lab 02/08/14 0512  NA 140  K 4.0  CL 100  CO2 27  GLUCOSE 83  BUN 10  CREATININE 0.76  CALCIUM 9.0   CBC:  Recent Labs Lab 02/08/14 0512  WBC 11.6*  NEUTROABS 7.5  HGB 13.8  HCT 41.1  MCV 91.5  PLT 298   Cardiac Enzymes:  Recent Labs Lab 02/08/14 1042  TROPONINI <0.30   CBG:  Recent Labs Lab 02/08/14 0754 02/08/14 1155  GLUCAP 102* 213*    EKG: Normal sinus rhythm, no ST/T wave changes  Gregory May, ISKRA, May  [facility] Pager [phone]  If 7PM-7AM, please contact night-coverage [email or web address] Password [email or web address] 02/08/2014, 1:50 PM

## 2014-02-08 NOTE — ED Notes (Signed)
Pt states his left leg is swollen and he is c/o pain behind his left knee  Pt states he has hip pinning on that side and pt states he is scheduled for a hip replacement on August 12th

## 2014-02-08 NOTE — ED Provider Notes (Signed)
CSN: [id number]     Arrival date & time 02/08/14  0037 History   First MD Initiated Contact with Patient 02/08/14 0356     Chief Complaint  Patient presents with  . Leg Pain     (Consider location/radiation/quality/duration/timing/severity/associated sxs/prior Treatment) HPI 53 year old male presents to the emergency department with complaint of left leg pain.  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  He is awaiting a replacement.  Patient has limited mobility of his left hip which has been getting worse with time.  Patient reports starting yesterday he noted increased swelling and pain to his left calf.  No prior history of DVT.  Patient reports flexion of his calf was painful.  He reports increased pain in his left hip as well.  He reports that pain eases slightly with positioning, but swelling has not improved.  He denies any shortness of breath or chest pain. Past Medical History  Diagnosis Date  . Sebaceous cyst     on back of neck  . Diabetes mellitus     type II   Past Surgical History  Procedure Laterality Date  . Rotator cuff repair  2005 (approx)    right   . Closed reduction with humer pin insertion  1974    left hip  . Joint replacement  2006 (approx)    right hip replaced   Family History  Problem Relation Age of Onset  . Cancer Mother     breast, colon, liver  . Diabetes Father    History  Substance Use Topics  . Smoking status: Never Smoker   . Smokeless tobacco: Never Used  . Alcohol Use: Yes     Comment: occasional    Review of Systems   See History of Present Illness; otherwise all other systems are reviewed and negative  Allergies  Review of patient's allergies indicates no known allergies.  Home Medications   Prior to Admission medications   Medication Sig Start Date End Date Taking? Authorizing Provider  bag balm OINT ointment Apply 1 application topically as needed for dry skin.   Yes Historical Provider, MD   ibuprofen (ADVIL,MOTRIN) 200 MG tablet Take 400 mg by mouth every 6 (six) hours as needed (pain).   Yes Historical Provider, MD  insulin aspart (NOVOLOG FLEXPEN) 100 UNIT/ML FlexPen Inject 7 units at breakfast and lunch and 10 units at dinner 09/23/13  Yes Reather LittlerAjay Kumar, MD  insulin glargine (LANTUS) 100 UNIT/ML injection Inject 20 Units into the skin every other day.  09/21/13  Yes Dorothea OgleIskra M Myers, MD  mupirocin ointment (BACTROBAN) 2 % Apply 1 application topically daily. Applied to affected heal 10/30/13  Yes Historical Provider, MD  pravastatin (PRAVACHOL) 40 MG tablet Take 1 tablet (40 mg total) by mouth daily. 10/14/13  Yes Reather LittlerAjay Kumar, MD  Vitamin D, Ergocalciferol, (DRISDOL) 50000 UNITS CAPS capsule Take 1 capsule (50,000 Units total) by mouth every 7 (seven) days. 11/06/13  Yes Richarda OverlieNayana Abrol, MD  RELION INSULIN SYR 0.3ML/31G 31G X 5/16" 0.3 ML MISC  12/07/13   Historical Provider, MD   BP 146/97  Pulse 70  Temp(Src) 97.7 F (36.5 C) (Oral)  Resp 20  Ht 6' (1.829 m)  Wt 184 lb (83.462 kg)  BMI 24.95 kg/m2  SpO2 94% Physical Exam  Nursing note and vitals reviewed. Constitutional: He is oriented to person, place, and time. He appears well-developed and well-nourished. No distress.  HENT:  Head: Normocephalic and atraumatic.  Nose: Nose normal.  Mouth/Throat:  Oropharynx is clear and moist.  Eyes: Conjunctivae and EOM are normal. Pupils are equal, round, and reactive to light.  Neck: Normal range of motion. Neck supple. No JVD present. No tracheal deviation present. No thyromegaly present.  Cardiovascular: Normal rate, regular rhythm, normal heart sounds and intact distal pulses.  Exam reveals no gallop and no friction rub.   No murmur heard. Pulmonary/Chest: Effort normal and breath sounds normal. No stridor. No respiratory distress. He has no wheezes. He has no rales. He exhibits no tenderness.  Abdominal: Soft. Bowel sounds are normal. He exhibits no distension and no mass. There is no  tenderness. There is no rebound and no guarding.  Musculoskeletal: Normal range of motion. He exhibits edema and tenderness.  Patient has pain with attempted range of motion of hip and knee as well as ankle of left leg.  He has swelling of the left lower extremity.  Sensation and pulses are intact.  Lymphadenopathy:    He has no cervical adenopathy.  Neurological: He is alert and oriented to person, place, and time. He exhibits normal muscle tone. Coordination normal.  Skin: Skin is warm and dry. No rash noted. No erythema. No pallor.  Psychiatric: He has a normal mood and affect. His behavior is normal. Judgment and thought content normal.    ED Course  Procedures (including critical care time) Labs Review Labs Reviewed  CBC WITH DIFFERENTIAL - Abnormal; Notable for the following:    WBC 11.6 (*)    Monocytes Absolute 1.3 (*)    All other components within normal limits  PROTIME-INR  BASIC METABOLIC PANEL    Imaging Review No results found.   EKG Interpretation None      MDM   Final diagnoses:  None   53 year old male with left calf pain and swelling.  Given limited mobility, concern for DVT.  Patient has been given Lovenox.  Given the time, will hold him in the emergency department until he is able to have a venous duplex this morning.  Olivia Mackielga M Otter, MD 02/08/14 [phone]

## 2014-02-08 NOTE — Progress Notes (Signed)
ANTICOAGULATION CONSULT NOTE - Follow Up Consult  Pharmacy Consult for Heparin/Warfarin Indication: PE & DVT  No Known Allergies  Patient Measurements: Height: 6' (182.9 cm) Weight: 180 lb 12.4 oz (82 kg) IBW/kg (Calculated) : 77.6  Vital Signs: Temp: 98 F (36.7 C) (06/29 1323) Temp src: Oral (06/29 1323) BP: 115/63 mmHg (06/29 1323) Pulse Rate: 77 (06/29 1323)  Labs:  Recent Labs  02/08/14 0512 02/08/14 1042 02/08/14 1713  HGB 13.8  --   --   HCT 41.1  --   --   PLT 298  --   --   LABPROT 14.0  --   --   INR 1.08  --   --   HEPARINUNFRC  --   --  0.71*  CREATININE 0.76  --   --   TROPONINI  --  <0.30  --     Estimated Creatinine Clearance: 119.9 ml/min (by C-G formula based on Cr of 0.76).   Medications:  Scheduled:  . sodium chloride   Intravenous STAT  . insulin aspart  0-5 Units Subcutaneous QHS  . insulin aspart  0-9 Units Subcutaneous TID WC  . [START ON 02/09/2014] insulin glargine  20 Units Subcutaneous QODAY  . mupirocin ointment  1 application Topical Daily  . simvastatin  10 mg Oral q1800  . sodium chloride  3 mL Intravenous Q12H  . [START ON 02/15/2014] Vitamin D (Ergocalciferol)  50,000 Units Oral Q7 days  . Warfarin - Pharmacist Dosing Inpatient   Does not apply q1800   Infusions:  . heparin 1,350 Units/hr (02/08/14 1212)    Assessment:  [age] yr male with extensive DVT in left leg and bilateral PEs.  IV heparin and warfarin started today  1st heparin level = 0.71 with heparin infusing @ 1350 units/hr (slightly SUPRAtherapeutic)  No complications of therapy noted  Goal of Therapy:  Heparin level 0.3-0.7 units/ml Monitor platelets by anticoagulation protocol: Yes   Plan:   Decrease heparin to 1250 units/hr  Check heparin level in 6 hrs  Follow up AM labs  Poindexter, Joselyn GlassmanLeann Trefz, PharmD 02/08/2014,7:12 PM

## 2014-02-08 NOTE — ED Notes (Signed)
Patient transported to CT 

## 2014-02-08 NOTE — ED Provider Notes (Signed)
Assumed care of patient from Dr. Norlene Campbelltter. Patient awaiting DVT study of left leg.  Found to have extensive DVT in left leg is near occlusive. Distal pulses intact. On recheck, patient admits to some shortness of breath a few days ago but none today. We'll evaluate for PE.  Patient with bilateral PEs. No evidence of right heart strain. We'll start IV heparin. Patient will need admission given his extensive clot burden. Vitals remain stable.  CRITICAL CARE Performed by: Glynn OctaveANCOUR, STEPHEN Total critical care time: 30  Critical care time was exclusive of separately billable procedures and treating other patients. Critical care was necessary to treat or prevent imminent or life-threatening deterioration. Critical care was time spent personally by me on the following activities: development of treatment plan with patient and/or surrogate as well as nursing, discussions with consultants, evaluation of patient's response to treatment, examination of patient, obtaining history from patient or surrogate, ordering and performing treatments and interventions, ordering and review of laboratory studies, ordering and review of radiographic studies, pulse oximetry and re-evaluation of patient's condition.  BP 128/88  Pulse 63  Temp(Src) 97.7 F (36.5 C) (Oral)  Resp 18  Ht 6' (1.829 m)  Wt 184 lb (83.462 kg)  BMI 24.95 kg/m2  SpO2 96%   Glynn OctaveStephen Rancour, MD 02/08/14 1057

## 2014-02-08 NOTE — Progress Notes (Signed)
Gregory May is currently on heparin protocol for a diagnosis of dvt.  Current Labs: Hematology Lab Results  Component Value Date/Time   WBC 11.6* 02/08/2014  5:12 AM   RBC 4.49 02/08/2014  5:12 AM   HGB 13.8 02/08/2014  5:12 AM   HCT 41.1 02/08/2014  5:12 AM   MCV 91.5 02/08/2014  5:12 AM   MCH 30.7 02/08/2014  5:12 AM   Platelets  Date Value Ref Range Status  02/08/2014 298  150 - 400 K/uL Final   No results found for this basename: PTT   INR  Date Value Ref Range Status  02/08/2014 1.08  0.00 - 1.1949 Final   53 year old male presents to the emergency department with complaint of left leg pain. 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 He is awaiting a replacement. Patient has limited mobility of his left hip which has been getting worse with time. Patient reports starting yesterday he noted increased swelling and pain to his left calf. No prior history of DVT. Patient reports flexion of his calf was painful. He reports increased pain in his left hip as well. He reports that pain eases slightly with positioning, but swelling has not improved. He denies any shortness of breath or chest pain  Gregory May is stable with regards to anticoagulation.  Goal of therapy:  Heparin with a target INR of 0.3-0.7  Monitor platelets, Hgb, Hct  Plan:  Heparin bolus 4000 Units then 1350 Units/hr (16 units/kg/hr)  Obtain 6 hour heparin level  Daily CBC  Daily heparin level    [name] RPh 02/08/2014, 10:51 AM Pager [phone]

## 2014-02-08 NOTE — Progress Notes (Signed)
Patient admitted from ED to 1442, alert and oriented, C/O left hip pain, pain med given in ED prior to transfer. Oriented patient to room/unit and reviewed plan of care with patient. IV Heparin started as ordered. Left leg with nonpitting edema and warm to touch, right heel stage II ulcer cleansed and dressing changed. Will continue to assess patient and F/U with plan of care.

## 2014-02-08 NOTE — Progress Notes (Signed)
VASCULAR LAB PRELIMINARY  PRELIMINARY  PRELIMINARY  PRELIMINARY  Bilateral lower extremity venous duplex completed.    Preliminary report:  Left - POSITIVE for an extensive deep vein thrombosis coursing from the ankle - posterior tibial, peroneal, through the popliteal, profunda, femoral and common femoral veins. There is minimal flow noted in the proximal common femoral vein. Thrombus also noted in the saphenofemoral junction. Right - Evaluation completed per protocol -  No evidence of deep or superficial thrombosis. Bilateral - No evidence of  A Baker's cyst.  SLAUGHTER, [place], RVS 02/08/2014, 8:43 AM

## 2014-02-08 NOTE — Progress Notes (Signed)
ANTICOAGULATION CONSULT NOTE - Initial Consult  Pharmacy Consult for Warfarin Indication: pulmonary embolus and DVT  No Known Allergies  Patient Measurements: Height: 6' (182.9 cm) Weight: 180 lb 12.4 oz (82 kg) IBW/kg (Calculated) : 77.6  Vital Signs: Temp: 98 F (36.7 C) (06/29 1323) Temp src: Oral (06/29 1323) BP: 115/63 mmHg (06/29 1323) Pulse Rate: 77 (06/29 1323)  Labs:  Recent Labs  02/08/14 0512 02/08/14 1042  HGB 13.8  --   HCT 41.1  --   PLT 298  --   LABPROT 14.0  --   INR 1.08  --   CREATININE 0.76  --   TROPONINI  --  <0.30    Estimated Creatinine Clearance: 119.9 ml/min (by C-G formula based on Cr of 0.76).   Medical History: Past Medical History  Diagnosis Date  . Sebaceous cyst     on back of neck  . Diabetes mellitus     type II    Medications:  Scheduled:  . sodium chloride   Intravenous STAT  . insulin aspart  0-5 Units Subcutaneous QHS  . insulin aspart  0-9 Units Subcutaneous TID WC  . [START ON 02/09/2014] insulin glargine  20 Units Subcutaneous QODAY  . mupirocin ointment  1 application Topical Daily  . simvastatin  10 mg Oral q1800  . sodium chloride  3 mL Intravenous Q12H  . [START ON 02/15/2014] Vitamin D (Ergocalciferol)  50,000 Units Oral Q7 days   Infusions:  . heparin 1,350 Units/hr (02/08/14 1212)   PRN: HYDROmorphone (DILAUDID) injection, ibuprofen, ondansetron (ZOFRAN) IV, ondansetron, oxyCODONE-acetaminophen  Assessment: 53 yom with extensive DVT in left leg and bilateral PEs. IV Heparin started today. Pharmacy consulted to dose warfarin. This is a new start warfarin patient.   Baseline INR 1.08  Patient is on a regular diet  No medication interactions  Goal of Therapy:  INR 2-3 Monitor platelets by anticoagulation protocol: Yes   Plan:   Give warfarin 7.5mg  po x1 today  Follow daily INRs  Educate patient  Loma BostonLaura Darnelle Derrick, PharmD Pager: [phone] 02/08/2014 2:08 PM

## 2014-02-09 DIAGNOSIS — I82409 Acute embolism and thrombosis of unspecified deep veins of unspecified lower extremity: Secondary | ICD-10-CM

## 2014-02-09 DIAGNOSIS — E1165 Type 2 diabetes mellitus with hyperglycemia: Secondary | ICD-10-CM

## 2014-02-09 DIAGNOSIS — IMO0001 Reserved for inherently not codable concepts without codable children: Secondary | ICD-10-CM

## 2014-02-09 LAB — GLUCOSE, CAPILLARY
Glucose-Capillary: 148 mg/dL — ABNORMAL HIGH (ref 70–99)
Glucose-Capillary: 158 mg/dL — ABNORMAL HIGH (ref 70–99)
Glucose-Capillary: 136 mg/dL — ABNORMAL HIGH (ref 70–99)
Glucose-Capillary: 242 mg/dL — ABNORMAL HIGH (ref 70–99)

## 2014-02-09 LAB — BASIC METABOLIC PANEL
BUN: 8 mg/dL (ref 6–23)
CO2: 26 mEq/L (ref 19–32)
Calcium: 8.2 mg/dL — ABNORMAL LOW (ref 8.4–10.5)
Chloride: 99 mEq/L (ref 96–112)
Creatinine, Ser: 0.7 mg/dL (ref 0.50–1.35)
GFR calc Af Amer: >90 mL/min (ref >90)
GFR calc non Af Amer: >90 mL/min (ref >90)
Glucose, Bld: 147 mg/dL — ABNORMAL HIGH (ref 70–99)
Potassium: 3.5 mEq/L — ABNORMAL LOW (ref 3.7–5.3)
Sodium: 136 mEq/L — ABNORMAL LOW (ref 137–147)

## 2014-02-09 LAB — HEPARIN LEVEL (UNFRACTIONATED)
Heparin Unfractionated: 0.32 IU/mL (ref 0.30–0.70)
Heparin Unfractionated: 0.26 IU/mL — ABNORMAL LOW (ref 0.30–0.70)
Heparin Unfractionated: 0.23 IU/mL — ABNORMAL LOW (ref 0.30–0.70)

## 2014-02-09 LAB — CBC
HCT: 38.3 % — ABNORMAL LOW (ref 39.0–52.0)
Hemoglobin: 12.9 g/dL — ABNORMAL LOW (ref 13.0–17.0)
MCH: 30.7 pg (ref 26.0–34.0)
MCHC: 33.7 g/dL (ref 30.0–36.0)
MCV: 91.2 fL (ref 78.0–100.0)
Platelets: 261 10*3/uL (ref 150–400)
RBC: 4.2 MIL/uL — ABNORMAL LOW (ref 4.22–5.81)
RDW: 13.8 % (ref 11.5–15.5)
WBC: 9.5 10*3/uL (ref 4.0–10.5)

## 2014-02-09 LAB — PROTIME-INR
INR: 1.06 (ref 0.00–1.49)
Prothrombin Time: 13.8 seconds (ref 11.6–15.2)

## 2014-02-09 MED ORDER — HEPARIN BOLUS VIA INFUSION
1200.0000 [IU] | Freq: Once | INTRAVENOUS | Status: AC
  Administered 2014-02-09: 1200 [IU] via INTRAVENOUS
  Filled 2014-02-09: qty 1200

## 2014-02-09 MED ORDER — PATIENT'S GUIDE TO USING COUMADIN BOOK
Freq: Once | Status: AC
  Administered 2014-02-09: 16:00:00
  Filled 2014-02-09: qty 1

## 2014-02-09 MED ORDER — HEPARIN (PORCINE) IN NACL 100-0.45 UNIT/ML-% IJ SOLN
1550.0000 [IU]/h | INTRAMUSCULAR | Status: DC
  Filled 2014-02-09: qty 250

## 2014-02-09 MED ORDER — INSULIN GLARGINE 100 UNIT/ML ~~LOC~~ SOLN
20.0000 [IU] | Freq: Every day | SUBCUTANEOUS | Status: DC
  Administered 2014-02-09: 20 [IU] via SUBCUTANEOUS
  Filled 2014-02-09 (×2): qty 0.2

## 2014-02-09 MED ORDER — WARFARIN SODIUM 7.5 MG PO TABS
7.5000 mg | ORAL_TABLET | Freq: Once | ORAL | Status: AC
  Administered 2014-02-09: 7.5 mg via ORAL
  Filled 2014-02-09: qty 1

## 2014-02-09 MED ORDER — WARFARIN VIDEO
Freq: Once | Status: AC
  Administered 2014-02-09: 12:00:00

## 2014-02-09 NOTE — Progress Notes (Signed)
ANTICOAGULATION CONSULT NOTE - Follow Up Consult  Pharmacy Consult for Heparin/Warfarin Indication: PE & DVT  No Known Allergies  Patient Measurements: Height: 6' (182.9 cm) Weight: 180 lb 12.4 oz (82 kg) IBW/kg (Calculated) : 77.6  Vital Signs: Temp: 98.2 F (36.8 C) (06/30 0545) Temp src: Oral (06/30 0545) BP: 109/61 mmHg (06/30 0545) Pulse Rate: 66 (06/30 0545)  Labs:  Recent Labs  02/08/14 0512 02/08/14 1042 02/08/14 1713 02/09/14 0225 02/09/14 0943  HGB 13.8  --   --  12.9*  --   HCT 41.1  --   --  38.3*  --   PLT 298  --   --  261  --   LABPROT 14.0  --   --   --  13.8  INR 1.08  --   --   --  1.06  HEPARINUNFRC  --   --  0.71* 0.32 0.26*  CREATININE 0.76  --   --  0.70  --   TROPONINI  --  <0.30  --   --   --     Estimated Creatinine Clearance: 119.9 ml/min (by C-G formula based on Cr of 0.7).   Medications:  Scheduled:  . insulin aspart  0-5 Units Subcutaneous QHS  . insulin aspart  0-9 Units Subcutaneous TID WC  . insulin glargine  20 Units Subcutaneous QHS  . mupirocin ointment  1 application Topical Daily  . simvastatin  10 mg Oral q1800  . sodium chloride  3 mL Intravenous Q12H  . [START ON 02/15/2014] Vitamin D (Ergocalciferol)  50,000 Units Oral Q7 days  . Warfarin - Pharmacist Dosing Inpatient   Does not apply q1800   Infusions:  . heparin 1,250 Units/hr (02/09/14 0227)    Assessment: 53 year old male presents to the emergency department with complaint of left leg pain. 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 Positive for L leg DVT and bilateral PE. Pharmacy consulted to dose heparin drip and warfarin.  6/30 0900 Heparin level = 0.26 (SUBtherapeutic) with Heparin gtt at 1250units/hr 6/30 0900 INR = 1.08 (SUBtherapeutic) after warfarin 7.5mg  dose x1 yesterday   No complications or bleeding noted  Patient on regular diet with good meal intake  No significant drug interactions.  Goal of Therapy:  INR 2-3 Heparin  level 0.3-0.7 units/ml Monitor platelets by anticoagulation protocol: Yes   Plan:   Increase Heparin to 1400 units/hr, no bolus  Check heparin level in 6 hrs  Give warfarin 7.5mg  po x1 today  Follow up AM labs  Educate patient  Loma BostonLaura Moran, PharmD Pager: [phone] 02/09/2014 10:37 AM

## 2014-02-09 NOTE — Progress Notes (Signed)
ANTICOAGULATION CONSULT NOTE - Follow Up Consult  Pharmacy Consult for Heparin Indication: PE & DVT   No Known Allergies  Patient Measurements: Height: 6' (182.9 cm) Weight: 180 lb 12.4 oz (82 kg) IBW/kg (Calculated) : 77.6 Heparin Dosing Weight:   Vital Signs: Temp: 98.4 F (36.9 C) (06/29 2134) Temp src: Oral (06/29 2134) BP: 129/70 mmHg (06/29 2134) Pulse Rate: 82 (06/29 2134)  Labs:  Recent Labs  02/08/14 0512 02/08/14 1042 02/08/14 1713 02/09/14 0225  HGB 13.8  --   --  12.9*  HCT 41.1  --   --  38.3*  PLT 298  --   --  261  LABPROT 14.0  --   --   --   INR 1.08  --   --   --   HEPARINUNFRC  --   --  0.71* 0.32  CREATININE 0.76  --   --  0.70  TROPONINI  --  <0.30  --   --     Estimated Creatinine Clearance: 119.9 ml/min (by C-G formula based on Cr of 0.7).   Medications:  Infusions:  . heparin 1,250 Units/hr (02/09/14 0227)    Assessment: Patient with heparin level at goal.  No issues noted.  Since prior level was just above goal, will continue with current rate and see where the third level falls.  Goal of Therapy:  Heparin level 0.3-0.7 units/ml Monitor platelets by anticoagulation protocol: Yes   Plan:  Continue heparin drip at current rate Recheck level at 0900  Darlina GuysGrimsley Jr, Jacquenette ShoneJulian Crowford 02/09/2014,5:14 AM

## 2014-02-09 NOTE — Progress Notes (Signed)
[facility] PROGRESS NOTE  Gregory May ZOX:[id number] DOB: [birth year] DOA: 02/08/2014 PCP: Jeanann LewandowskyJEGEDE, OLUGBEMIGA, Gregory May  Assessment/Plan: 53 year old male with history of diabetes and hypertension, djd, presented to emergency department with main concern of sudden onset of left leg pain, swelling  -Emergency department, patient noted to have left lower extremity DVT, CT chest notable for pulmonary emboli.   Active Problems:  1. Pulmonary emboli, acute  - cont heparin, bridging with Coumadin as per pharmacy  - Provide analgesia as needed  2. DVT, acute LLE  - doppler positive for an extensive deep vein thrombosis coursing from the ankle - posterior tibial, peroneal, through the popliteal, profunda, femoral and common femoral veins  - Management with heparin and bridging with Coumadin per pharmacy  3. Diabetes mellitus with complications of neuropathy  - Continue home medical regimen ISS, diet changed to carb modified - changed lantus QHS(Pt was on every other night); titrate [per response  4. Hypertension  - not on meds, monitor     Radiological Exams on Admission:  Ct Angio Chest /29/2015 Positive for bilateral PE. RV/ LV ratio is calculated at 0.88, which is not considered increased.      Code Status: full Family Communication:  D/w patient  (indicate person spoken with, relationship, and if by phone, the number) Disposition Plan: home pend clinical improvement, labs    Consultants:  none  Procedures:  noen  Antibiotics:  None  (indicate start date, and stop date if known)  HPI/Subjective: alert  Objective: Filed Vitals:   02/09/14 0545  BP: 109/61  Pulse: 66  Temp: 98.2 F (36.8 C)  Resp: 16    Intake/Output Summary (Last 24 hours) at 02/09/14 0906 Last data filed at 02/09/14 0545  Gross per 24 hour  Intake 1856.55 ml  Output   1725 ml  Net 131.55 ml   Filed Weights   02/08/14 0453 02/08/14 1135  Weight: 83.462 kg (184 lb) 82 kg (180 lb  12.4 oz)    Exam:   General:  alert  Cardiovascular: s1,s2 rrr  Respiratory: CTA BL  Abdomen: soft, nt, nd   Musculoskeletal: no LE edema;    Data Reviewed: Basic Metabolic Panel:  Recent Labs Lab 02/08/14 0512 02/09/14 0225  NA 140 136*  K 4.0 3.5*  CL 100 99  CO2 27 26  GLUCOSE 83 147*  BUN 10 8  CREATININE 0.76 0.70  CALCIUM 9.0 8.2*   Liver Function Tests: No results found for this basename: AST, ALT, ALKPHOS, BILITOT, PROT, ALBUMIN,  in the last 168 hours No results found for this basename: LIPASE, AMYLASE,  in the last 168 hours No results found for this basename: AMMONIA,  in the last 168 hours CBC:  Recent Labs Lab 02/08/14 0512 02/09/14 0225  WBC 11.6* 9.5  NEUTROABS 7.5  --   HGB 13.8 12.9*  HCT 41.1 38.3*  MCV 91.5 91.2  PLT 298 261   Cardiac Enzymes:  Recent Labs Lab 02/08/14 1042  TROPONINI <0.30   BNP (last 3 results) No results found for this basename: PROBNP,  in the last 8760 hours CBG:  Recent Labs Lab 02/08/14 0754 02/08/14 1155 02/08/14 1700 02/08/14 2135 02/09/14 0737  GLUCAP 102* 213* 105* 181* 148*    No results found for this or any previous visit (from the past 240 hour(s)).   Studies: Ct Angio Chest Pe W/cm &/or Wo Cm  02/08/2014   CLINICAL DATA:  Shortness of breath. Cough. Left lower extremity DVT.  EXAM: CT ANGIOGRAPHY  CHEST WITH CONTRAST  TECHNIQUE: Multidetector CT imaging of the chest was performed using the standard protocol during bolus administration of intravenous contrast. Multiplanar CT image reconstructions and MIPs were obtained to evaluate the vascular anatomy.  CONTRAST:  100mL OMNIPAQUE IOHEXOL 350 MG/ML SOLN  COMPARISON:  None.  FINDINGS: Pulmonary emboli are seen in the distal right and left pulmonary arteries as well as all lobar pulmonary artery branches, more extensive in the right pulmonary arteries than the left.  No evidence of thoracic aortic aneurysm or dissection. No evidence of pleural or  pericardial effusion. No evidence of mass or lymphadenopathy.  Mild dependent atelectasis is seen bilaterally, however there is no evidence of pulmonary airspace disease or mass. No evidence of pleural or pericardial effusion.  Review of the MIP images confirms the above findings.  IMPRESSION: Positive for bilateral pulmonary embolism. RV/ LV ratio is calculated at 0.88, which is not considered increased.  Critical Value/emergent results were called by telephone at the time of interpretation on 02/08/2014 at 10:08 AM to Dr. Glynn OctaveSTEPHEN RANCOUR , who verbally acknowledged these results.   Electronically Signed   By: Myles RosenthalJohn  Stahl M.D.   On: 02/08/2014 10:14    Scheduled Meds: . insulin aspart  0-5 Units Subcutaneous QHS  . insulin aspart  0-9 Units Subcutaneous TID WC  . insulin glargine  20 Units Subcutaneous QODAY  . mupirocin ointment  1 application Topical Daily  . simvastatin  10 mg Oral q1800  . sodium chloride  3 mL Intravenous Q12H  . [START ON 02/15/2014] Vitamin D (Ergocalciferol)  50,000 Units Oral Q7 days  . Warfarin - Pharmacist Dosing Inpatient   Does not apply q1800   Continuous Infusions: . heparin 1,250 Units/hr (02/09/14 0227)    Active Problems:   Pulmonary emboli   DVT (deep venous thrombosis)    Time spent: no LE edema    Gregory May, Gregory May  [facility] Pager [phone]. If 7PM-7AM, please contact night-coverage at [email or web address], password [email or web address] 02/09/2014, 9:06 AM  LOS: 1 day

## 2014-02-09 NOTE — Progress Notes (Signed)
ANTICOAGULATION CONSULT NOTE - Follow Up Consult  Pharmacy Consult for Heparin/Warfarin Indication: PE & DVT   Labs:  Recent Labs  02/08/14 0512 02/08/14 1042 02/08/14 1713 02/09/14 0225 02/09/14 0943  HGB 13.8  --   --  12.9*  --   HCT 41.1  --   --  38.3*  --   PLT 298  --   --  261  --   LABPROT 14.0  --   --   --  13.8  INR 1.08  --   --   --  1.06  HEPARINUNFRC  --   --  0.71* 0.32 0.26*  CREATININE 0.76  --   --  0.70  --   TROPONINI  --  <0.30  --   --   --     Infusions:  . heparin 1,400 Units/hr (02/09/14 1030)    Assessment: Pharmacy consulted to dose heparin drip and warfarin for DVT and bilateral PE.    6/30 1700 Heparin level = 0.23, SUBtherapeutic and decreased despite previous rate increase.  Heparin gtt at 1400 units/hr  No complications or bleeding noted  Heparin level and new orders delayed due to power and network loss.   Goal of Therapy:  INR 2-3 Heparin level 0.3-0.7 units/ml Monitor platelets by anticoagulation protocol: Yes   Plan:   Bolus 1200 units IV x1  Increase Heparin to 1550 units/hr  Check heparin level in 6 hrs   Lynann Beaverhristine Shade PharmD, BCPS Pager [phone] 02/09/2014 7:49 PM

## 2014-02-10 DIAGNOSIS — M169 Osteoarthritis of hip, unspecified: Secondary | ICD-10-CM

## 2014-02-10 DIAGNOSIS — E1169 Type 2 diabetes mellitus with other specified complication: Secondary | ICD-10-CM

## 2014-02-10 DIAGNOSIS — L97509 Non-pressure chronic ulcer of other part of unspecified foot with unspecified severity: Secondary | ICD-10-CM

## 2014-02-10 DIAGNOSIS — M1610 Unilateral primary osteoarthritis, unspecified hip: Secondary | ICD-10-CM

## 2014-02-10 LAB — CBC
HCT: 39.9 % (ref 39.0–52.0)
Hemoglobin: 13.3 g/dL (ref 13.0–17.0)
MCH: 30.6 pg (ref 26.0–34.0)
MCHC: 33.3 g/dL (ref 30.0–36.0)
MCV: 91.7 fL (ref 78.0–100.0)
Platelets: 273 10*3/uL (ref 150–400)
RBC: 4.35 MIL/uL (ref 4.22–5.81)
RDW: 13.8 % (ref 11.5–15.5)
WBC: 9.9 10*3/uL (ref 4.0–10.5)

## 2014-02-10 LAB — HEPARIN LEVEL (UNFRACTIONATED)
Heparin Unfractionated: 0.28 IU/mL — ABNORMAL LOW (ref 0.30–0.70)
Heparin Unfractionated: 0.53 IU/mL (ref 0.30–0.70)

## 2014-02-10 LAB — PROTIME-INR
INR: 1.05 (ref 0.00–1.49)
Prothrombin Time: 13.7 seconds (ref 11.6–15.2)

## 2014-02-10 LAB — GLUCOSE, CAPILLARY
Glucose-Capillary: 109 mg/dL — ABNORMAL HIGH (ref 70–99)
Glucose-Capillary: 141 mg/dL — ABNORMAL HIGH (ref 70–99)

## 2014-02-10 MED ORDER — SENNOSIDES-DOCUSATE SODIUM 8.6-50 MG PO TABS: 1.0000 | ORAL_TABLET | Freq: Two times a day (BID) | ORAL | Status: DC | PRN

## 2014-02-10 MED ORDER — OXYCODONE-ACETAMINOPHEN 5-325 MG PO TABS: 2.0000 | ORAL_TABLET | Freq: Four times a day (QID) | ORAL | Status: DC | PRN

## 2014-02-10 MED ORDER — ENOXAPARIN SODIUM 120 MG/0.8ML ~~LOC~~ SOLN
120.0000 mg | SUBCUTANEOUS | Status: DC
  Filled 2014-02-10: qty 0.8

## 2014-02-10 MED ORDER — SENNOSIDES-DOCUSATE SODIUM 8.6-50 MG PO TABS: 1.0000 | ORAL_TABLET | Freq: Every evening | ORAL | Status: DC | PRN

## 2014-02-10 MED ORDER — WARFARIN SODIUM 10 MG PO TABS
10.0000 mg | ORAL_TABLET | Freq: Once | ORAL | Status: DC
  Filled 2014-02-10: qty 1

## 2014-02-10 MED ORDER — ENOXAPARIN (LOVENOX) PATIENT EDUCATION KIT
PACK | Freq: Once | Status: AC
  Administered 2014-02-10: 16:00:00
  Filled 2014-02-10: qty 1

## 2014-02-10 MED ORDER — HEPARIN (PORCINE) IN NACL 100-0.45 UNIT/ML-% IJ SOLN
1750.0000 [IU]/h | INTRAMUSCULAR | Status: AC
  Administered 2014-02-10: 1750 [IU]/h via INTRAVENOUS
  Filled 2014-02-10 (×3): qty 250

## 2014-02-10 MED ORDER — ENOXAPARIN SODIUM 120 MG/0.8ML ~~LOC~~ SOLN: 120.0000 mg | SUBCUTANEOUS | Status: DC

## 2014-02-10 NOTE — Discharge Instructions (Addendum)
Pulmonary Embolus A pulmonary (lung) embolus (PE) is a blood clot that has traveled from another place in the body to the lung. Most clots come from deep veins in the legs or pelvis. PE is a dangerous and potentially life-threatening condition that can be treated if identified. CAUSES Blood clots form in a vein for different reasons. Usually several things cause blood clots. They include:  The flow of blood slows down.  The inside of the vein is damaged in some way.  The person has a condition that makes the blood clot more easily. These conditions may include:  Older age (especially over 30 years old).  Having a history of blood clots.  Having major or lengthy surgery. Hip surgery is particularly high-risk.  Breaking a hip or leg.  Sitting or lying still for a long time.  Cancer or cancer treatment.  Having a long, thin tube (catheter) placed inside a vein during a medical procedure.  Being overweight (obese).  Pregnancy and childbirth.  Medicines with estrogen.  Smoking.  Other circulation or heart problems. SYMPTOMS  The symptoms of a PE usually start suddenly and include:  Shortness of breath.  Coughing.  Coughing up blood or blood-tinged mucus (phlegm).  Chest pain. Pain is often worse with deep breaths.  Rapid heartbeat. DIAGNOSIS  If a PE is suspected, your caregiver will take a medical history and carry out a physical exam. Your caregiver will check for the risk factors listed above. Tests that also may be required include:  Blood tests, including studies of the clotting properties of your blood.  Imaging tests. Ultrasound, CT, MRI, and other tests can all be used to see if you have clots in your legs or lungs. If you have a clot in your legs and have breathing or chest problems, your caregiver may conclude that you have a clot in your lungs. Further lung tests may not be needed.  Electrocardiography can look for heart strain from blood clots in the  lungs. PREVENTION   Exercise the legs regularly. Take a brisk 30 minute walk every day.  Maintain a weight that is appropriate for your height.  Avoid sitting or lying in bed for long periods of time without moving your legs.  Women, particularly those over the age of 4, should consider the risks and benefits of taking estrogen medicines, including birth control pills.  Do not smoke, especially if you take estrogen medicines.  Long-distance travel can increase your risk. You should exercise your legs by walking or pumping the muscles every hour.  In hospital prevention:  Your caregiver will assess your need for preventive PE care (prophylaxis) when you are admitted to the hospital. If you are having surgery, your surgeon will assess you the day of or day after surgery.  Prevention may include medical and nonmedical measures. TREATMENT   The most common treatment for a PE is blood thinning (anticoagulant) medicine, which reduces the blood's tendency to clot. Anticoagulants can stop new blood clots from forming and old ones from growing. They cannot dissolve existing clots. Your body does this by itself over time. Anticoagulants can be given by mouth, by intravenous (IV) access, or by injection. Your caregiver will determine the best program for you.  Less commonly, clot-dissolving drugs (thrombolytics) are used to dissolve a PE. They carry a high risk of bleeding, so they are used mainly in severe cases.  Very rarely, a blood clot in the leg needs to be removed surgically.  If you are unable to  take anticoagulants, your caregiver may arrange for you to have a filter placed in a main vein in your abdomen. This filter prevents clots from traveling to your lungs. HOME CARE INSTRUCTIONS   Take all medicines prescribed by your caregiver. Follow the directions carefully.  Warfarin. Most people will continue taking warfarin after hospital discharge. Your caregiver will advise you on the  length of treatment (usually 3-6 months, sometimes lifelong).  Too much and too little warfarin are both dangerous. Too much warfarin increases the risk of bleeding. Too little warfarin continues to allow the risk for blood clots. While taking warfarin, you will need to have regular blood tests to measure your blood clotting time. These blood tests usually include both the prothrombin time (PT) and International Normalized Ratio (INR) tests. The PT and INR results allow your caregiver to adjust your dose of warfarin. The dose can change for many reasons. It is critically important that you take warfarin exactly as prescribed, and that you have your PT and INR levels drawn exactly as directed.  Many foods, especially foods high in vitamin K can interfere with warfarin and affect the PT and INR results. Foods high in vitamin K include spinach, kale, broccoli, cabbage, collard and turnip greens, brussels sprouts, peas, cauliflower, seaweed, and parsley as well as beef and pork liver, green tea, and soybean oil. You should eat a consistent amount of foods high in vitamin K. Avoid major changes in your diet, or notify your caregiver before changing your diet. Arrange a visit with a dietitian to answer your questions.  Many medicines can interfere with warfarin and affect the PT and INR results. You must tell your caregiver about any and all medicines you take, this includes all vitamins and supplements. Be especially cautious with aspirin and anti-inflammatory medicines. Ask your caregiver before taking these. Do not take or discontinue any prescribed or over-the-counter medicine except on the advice of your caregiver or pharmacist.  Warfarin can have side effects, such as excessive bruising or bleeding. You will need to hold pressure over cuts for longer than usual.  Alcohol can change the body's ability to handle warfarin. It is best to avoid alcoholic drinks or consume only very small amounts while taking  warfarin. Notify your caregiver if you change your alcohol intake.  Notify your dentist or other caregivers before procedures.  Avoid contact sports.  Wear a medical alert bracelet or carry a medical alert card.  Ask your caregiver how soon you can go back to normal activities. Not being active can lead to new clots. Ask for a list of what you should and should not do.  Compression stockings. These are tight elastic stockings that apply pressure to the lower legs. This can help keep the blood in the legs from clotting. You may need to wear compressions stockings at home to help prevent clots.  Smoking. If you smoke, quit. Ask your caregiver for help with quitting smoking.  Learn as much as you can about PE. Educating yourself can help prevent PE from reoccurring. SEEK MEDICAL CARE IF:   You notice a rapid heartbeat.  You feel weaker or more tired than usual.  You feel faint.  You notice increased bruising.  Your symptoms are not getting better in the time expected.  You are having side effects of medicine. SEEK IMMEDIATE MEDICAL CARE IF:   You have chest pain.  You have trouble breathing.  You have new or increased swelling or pain in one leg.  You cough  up blood.  You notice blood in vomit, in a bowel movement, or in urine.  You have an oral temperature above 102 F (38.9 C), not controlled by medicine. You may have another PE. A blood clot in the lungs is a medical emergency. Call your local emergency services (911 in [place] to get to the nearest hospital or clinic. Do not drive yourself. MAKE SURE YOU:   Understand these instructions.  Will watch your condition.  Will get help right away if you are not doing well or get worse. Document Released: 07/27/2000 Document Revised: 01/29/2012 Document Reviewed: 01/31/2009 [facility] Patient Information 2015 [place], [place]. This information is not intended to replace advice given to you by your health care provider. Make  sure you discuss any questions you have with your health care provider.   Deep Vein Thrombosis A deep vein thrombosis (DVT) is a blood clot that develops in the deep, larger veins of the leg, arm, or pelvis. These are more dangerous than clots that might form in veins near the surface of the body. A DVT can lead to complications if the clot breaks off and travels in the bloodstream to the lungs.  A DVT can damage the valves in your leg veins, so that instead of flowing upward, the blood pools in the lower leg. This is called post-thrombotic syndrome, and it can result in pain, swelling, discoloration, and sores on the leg. CAUSES Usually, several things contribute to blood clots forming. Contributing factors include:  The flow of blood slows down.  The inside of the vein is damaged in some way.  You have a condition that makes blood clot more easily. RISK FACTORS Some people are more likely than others to develop blood clots. Risk factors include:   Older age, especially over 51 years of age.  Having a family history of blood clots or if you have already had a blot clot.  Having major or lengthy surgery. This is especially true for surgery on the hip, knee, or belly (abdomen). Hip surgery is particularly high risk.  Breaking a hip or leg.  Sitting or lying still for a long time. This includes long-distance travel, paralysis, or recovery from an illness or surgery.  Having cancer or cancer treatment.  Having a long, thin tube (catheter) placed inside a vein during a medical procedure.  Being overweight (obese).  Pregnancy and childbirth.  Hormone changes make the blood clot more easily during pregnancy.  The fetus puts pressure on the veins of the pelvis.  There is a risk of injury to veins during delivery or a caesarean. The risk is highest just after childbirth.  Medicines with the male hormone estrogen. This includes birth control pills and hormone replacement  therapy.  Smoking.  Other circulation or heart problems.  SIGNS AND SYMPTOMS When a clot forms, it can either partially or totally block the blood flow in that vein. Symptoms of a DVT can include:  Swelling of the leg or arm, especially if one side is much worse.  Warmth and redness of the leg or arm, especially if one side is much worse.  Pain in an arm or leg. If the clot is in the leg, symptoms may be more noticeable or worse when standing or walking. The symptoms of a DVT that has traveled to the lungs (pulmonary embolism, PE) usually start suddenly and include:  Shortness of breath.  Coughing.  Coughing up blood or blood-tinged phlegm.  Chest pain. The chest pain is often worse with  deep breaths.  Rapid heartbeat. Anyone with these symptoms should get emergency medical treatment right away. Call your local emergency services (911 in the [place] if you have these symptoms. DIAGNOSIS If a DVT is suspected, your health care provider will take a full medical history and perform a physical exam. Tests that also may be required include:  Blood tests, including studies of the clotting properties of the blood.  Ultrasonography to see if you have clots in your legs or lungs.  X-rays to show the flow of blood when dye is injected into the veins (venography).  Studies of your lungs if you have any chest symptoms. PREVENTION  Exercise the legs regularly. Take a brisk 30-minute walk every day.  Maintain a weight that is appropriate for your height.  Avoid sitting or lying in bed for long periods of time without moving your legs.  Women, particularly those over the age of 35 years, should consider the risks and benefits of taking estrogen medicines, including birth control pills.  Do not smoke, especially if you take estrogen medicines.  Long-distance travel can increase your risk of DVT. You should exercise your legs by walking or pumping the muscles every hour.  In-hospital  prevention:  Many of the risk factors above relate to situations that exist with hospitalization, either for illness, injury, or elective surgery.  Your health care provider will assess you for the need for venous thromboembolism prophylaxis when you are admitted to the hospital. If you are having surgery, your surgeon will assess you the day of or day after surgery.  Prevention may include medical and nonmedical measures. TREATMENT Once identified, a DVT can be treated. It can also be prevented in some circumstances. Once you have had a DVT, you may be at increased risk for a DVT in the future. The most common treatment for DVT is blood thinning (anticoagulant) medicine, which reduces the blood's tendency to clot. Anticoagulants can stop new blood clots from forming and stop old ones from growing. They cannot dissolve existing clots. Your body does this by itself over time. Anticoagulants can be given by mouth, by IV access, or by injection. Your health care provider will determine the best program for you. Other medicines or treatments that may be used are:  Heparin or related medicines (low molecular weight heparin) are usually the first treatment for a blood clot. They act quickly. However, they cannot be taken orally.  Heparin can cause a fall in a component of blood that stops bleeding and forms blood clots (platelets). You will be monitored with blood tests to be sure this does not occur.  Warfarin is an anticoagulant that can be swallowed. It takes a few days to start working, so usually heparin or related medicines are used in combination. Once warfarin is working, heparin is usually stopped.  Less commonly, clot dissolving drugs (thrombolytics) are used to dissolve a DVT. They carry a high risk of bleeding, so they are used mainly in severe cases, where your life or a limb is threatened.  Very rarely, a blood clot in the leg needs to be removed surgically.  If you are unable to take  anticoagulants, your health care provider may arrange for you to have a filter placed in a main vein in your abdomen. This filter prevents clots from traveling to your lungs. HOME CARE INSTRUCTIONS  Take all medicines prescribed by your health care provider. Only take over-the-counter or prescription medicines for pain, fever, or discomfort as directed by your health  care provider.  Warfarin. Most people will continue taking warfarin after hospital discharge. Your health care provider will advise you on the length of treatment (usually 3-6 months, sometimes lifelong).  Too much and too little warfarin are both dangerous. Too much warfarin increases the risk of bleeding. Too little warfarin continues to allow the risk for blood clots. While taking warfarin, you will need to have regular blood tests to measure your blood clotting time. These blood tests usually include both the prothrombin time (PT) and international normalized ratio (INR) tests. The PT and INR results allow your health care provider to adjust your dose of warfarin. The dose can change for many reasons. It is critically important that you take warfarin exactly as prescribed, and that you have your PT and INR levels drawn exactly as directed.  Many foods, especially foods high in vitamin K, can interfere with warfarin and affect the PT and INR results. Foods high in vitamin K include spinach, kale, broccoli, cabbage, collard and turnip greens, brussel sprouts, peas, cauliflower, seaweed, and parsley as well as beef and pork liver, green tea, and soybean oil. You should eat a consistent amount of foods high in vitamin K. Avoid major changes in your diet, or notify your health care provider before changing your diet. Arrange a visit with a dietitian to answer your questions.  Many medicines can interfere with warfarin and affect the PT and INR results. You must tell your health care provider about any and all medicines you take. This includes  all vitamins and supplements. Be especially cautious with aspirin and anti-inflammatory medicines. Ask your health care provider before taking these. Do not take or discontinue any prescribed or over-the-counter medicine except on the advice of your health care provider or pharmacist.  Warfarin can have side effects, primarily excessive bruising or bleeding. You will need to hold pressure over cuts for longer than usual. Your health care provider or pharmacist will discuss other potential side effects.  Alcohol can change the body's ability to handle warfarin. It is best to avoid alcoholic drinks or consume only very small amounts while taking warfarin. Notify your health care provider if you change your alcohol intake.  Notify your dentist or other health care providers before procedures.  Activity. Ask your health care provider how soon you can go back to normal activities. It is important to stay active to prevent blood clots. If you are on anticoagulant medicine, avoid contact sports.  Exercise. It is very important to exercise. This is especially important while traveling, sitting, or standing for long periods of time. Exercise your legs by walking or by pumping the muscles frequently. Take frequent walks.  Compression stockings. These are tight elastic stockings that apply pressure to the lower legs. This pressure can help keep the blood in the legs from clotting. You may need to wear compression stockings at home to help prevent a DVT.  Do not smoke. If you smoke, quit. Ask your health care provider for help with quitting smoking.  Learn as much as you can about DVT. Knowing more about the condition should help you keep it from coming back.  Wear a medical alert bracelet or carry a medical alert card. SEEK MEDICAL CARE IF:  You notice a rapid heartbeat.  You feel weaker or more tired than usual.  You feel faint.  You notice increased bruising.  You feel your symptoms are not  getting better in the time expected.  You believe you are having side effects of  medicine. SEEK IMMEDIATE MEDICAL CARE IF:  You have chest pain.  You have trouble breathing.  You have new or increased swelling or pain in one leg.  You cough up blood.  You notice blood in vomit, in a bowel movement, or in urine. MAKE SURE YOU:  Understand these instructions.  Will watch your condition.  Will get help right away if you are not doing well or get worse. Document Released: 07/30/2005 Document Revised: 05/20/2013 Document Reviewed: 04/06/2013 [facility] Patient Information 2015 [place], [place]. This information is not intended to replace advice given to you by your health care provider. Make sure you discuss any questions you have with your health care provider.

## 2014-02-10 NOTE — Progress Notes (Addendum)
ANTICOAGULATION CONSULT NOTE - Initial Consult  Pharmacy Consult for lovenox (from warfarin/heparin) Indication: pulmonary embolus and DVT  No Known Allergies  Patient Measurements: Height: 6' (182.9 cm) Weight: 180 lb 12.4 oz (82 kg) IBW/kg (Calculated) : 77.6 Heparin Dosing Weight:   Vital Signs: Temp: 98.5 F (36.9 C) (07/01 1416) Temp src: Oral (07/01 1416) BP: 118/63 mmHg (07/01 1416) Pulse Rate: 80 (07/01 1416)  Labs:  Recent Labs  02/08/14 0512 02/08/14 1042  02/09/14 0225 02/09/14 0943 02/09/14 1649 02/10/14 0150 02/10/14 1015  HGB 13.8  --   --  12.9*  --   --   --  13.3  HCT 41.1  --   --  38.3*  --   --   --  39.9  PLT 298  --   --  261  --   --   --  273  LABPROT 14.0  --   --   --  13.8  --  13.7  --   INR 1.08  --   --   --  1.06  --  1.05  --   HEPARINUNFRC  --   --   < > 0.32 0.26* 0.23* 0.28* 0.53  CREATININE 0.76  --   --  0.70  --   --   --   --   TROPONINI  --  <0.30  --   --   --   --   --   --   < > = values in this interval not displayed.  Estimated Creatinine Clearance: 119.9 ml/min (by C-G formula based on Cr of 0.7).   Medical History: Past Medical History  Diagnosis Date  . Sebaceous cyst     on back of neck  . Diabetes mellitus     type II    Assessment: 53 year old male presents to the emergency department with complaint of left leg pain. 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 Positive for L leg DVT and bilateral PE. Initially started on heparin/coumadin and will now transition to lovenox alone  Goal of Therapy:  Anti-Xa level 0.6-1 units/ml 4hrs after LMWH dose given Monitor platelets by anticoagulation protocol: Yes   Plan:   Lovenox 1.5mg /kg (120mg ) SQ q24h (plan is LMWH monotherapy, no bridge to warfarin)  Give 1st dose 1h after heparin gtt off  CBC q72h while hospitalized  D/C heparin/coumadin labs  Dannielle HuhZeigler, Tamarra Geiselman George 02/10/2014,2:26 PM

## 2014-02-10 NOTE — Discharge Summary (Signed)
Physician Discharge Summary  Gregory May WUJ:[id number] DOB: October 16, 1960 DOA: 02/08/2014  PCP: Jeanann Lewandowsky, MD  Admit date: 02/08/2014 Discharge date: 02/10/2014  Time spent: 40 minutes  Recommendations for Outpatient Follow-up:  -Discharged home with outpatient follow up at the [facility]. Patient will be discharged on once a day subcutaneous Lovenox with plan to treat for at least 6 months in duration. Please check hemoglobin and renal function during outpatient followup -Patient needs to followup with Dr. Mckinley Jewel in 2 weeks to discuss on scheduling his left hip surgery.  Discharge Diagnoses:  Principal Problem:   Bilateral Pulmonary emboli  Active Problems:   Diabetic foot ulcer associated with type 2 diabetes mellitus   Poorly controlled type 2 diabetes mellitus with complication   DVT (deep venous thrombosis), extensive on the left   Osteoarthritis of left hip   Discharge Condition: Fair  Diet recommendation: Diabetic  Filed Weights   02/08/14 0453 02/08/14 1135  Weight: 83.462 kg (184 lb) 82 kg (180 lb 12.4 oz)    History of present illness:  Please refer to admission H&P for details, but in brief, 53 year old male with poorly controlled type 2 diabetes mellitus, diabetic foot ulcer, hypertension, osteoarthritis of left hip who presented to the ED with sudden onset of left leg pain radiating to the left groin, and Ceftin MCV 80 with poor ambulation. He also reported intermittent shortness of breath or possibilities. Denied any chest pain, recent trauma to the leg, fevers, chills, hemoptysis or recent travel. In the ED patient was found to have extensive left lower leg DVT. CT angiogram of the chest done showed bilateral PE. Patient admitted to telemetry and started on heparin drip.  Hospital Course:  Acute bilateral PE Underlying etiology is extensive left lower leg DVT possibly in the setting of poor mobility with diabetic foot ulcer and  osteoarthritis of the left hip. CT angiogram of the chest showed bilateral PE without right heart strain. Patient started on IV heparin drip. Stable on telemetry. Hypercoagulable workup not sent on admission. Family history of DVT or PE. -pain control with when necessary Percocet. -Patient clinically stable and will discharge him on subcutaneous Lovenox 1.5 mg/ kg / day. Patient provided with Lovenox teaching and will  be given prescriptions to [facility]. He will be provided with further mediation assistance there. Given no clear underlying cause for his DVT and PE he would need to be treated for at least 6 months. -Patient clinically stable without any shortness of breath. His lower leg pain symptoms have markedly improved. -Patient has an appointment scheduled at the [facility] for July 15 at 10 AM with Dr. Orpah Cobb.  Left lower leg DVT Doppler lower extremity positive for extensive DVT coursing from the ankle to the posterior tibial, peroneal, popliteal, profunda femoral and common femoral veins. Plan to discharge on Lovenox with outpatient followup.  Osteoarthritis of left hip Patient scheduled for arthroplasty of left hip on 8/12 with Dr. Mckinley Jewel. i have related the information to his PA Genelle Bal over the phone after Eulah Pont is not in office until next week. Given the acute DVT and PE the surgery likely need to be be scheduled after 3 months of anticoagulation. Will defer the plan to Dr. Eulah Pont. Patient will need to schedule an appointment with Dr. Eulah Pont in 2 weeks. I will discharge him on some Percocet for his acute lower extremity pain.  Uncontrolled Diabetes mellitus with neuropathy  and right foot ulcer Continue home regimen of Lantus and aspirate. Follows with Dr. Lucianne MussKumar.  Hypertension Not on any medications. Stable  Dyslipidemia Continue statin  Diet: Diabetic     Procedures:  CT angiogram  of the chest  Doppler lower extremities  Consultations:  None  Discharge Exam: Filed Vitals:   02/10/14 1416  BP: 118/63  Pulse: 80  Temp: 98.5 F (36.9 C)  Resp: 18   General: Middle aged male in no acute distress HEENT: No pallor, moist oral mucosa Chest: Clear to auscultation bilaterally, no added sounds CVS: Normal S1 and S2, no murmurs rubs or gallops Abdomen: Soft, nontender, nondistended, bowel sounds present Extremities: Warm, no edema, no leg swellings, limited ROM of left hip, stable ulcer over the heel of right foot  CNS: AAO x3    Discharge Instructions You were cared for by a hospitalist during your hospital stay. If you have any questions about your discharge medications or the care you received while you were in the hospital after you are discharged, you can call the unit and asked to speak with the hospitalist on call if the hospitalist that took care of you is not available. Once you are discharged, your primary care physician will handle any further medical issues. Please note that NO REFILLS for any discharge medications will be authorized once you are discharged, as it is imperative that you return to your primary care physician (or establish a relationship with a primary care physician if you do not have one) for your aftercare needs so that they can reassess your need for medications and monitor your lab values.     Medication List         bag balm Oint ointment  Apply 1 application topically as needed for dry skin.     enoxaparin 120 MG/0.8ML injection  Commonly known as:  LOVENOX  Inject 0.8 mLs (120 mg total) into the skin daily.     ibuprofen 200 MG tablet  Commonly known as:  ADVIL,MOTRIN  Take 400 mg by mouth every 6 (six) hours as needed (pain).     insulin aspart 100 UNIT/ML FlexPen  Commonly known as:  NOVOLOG FLEXPEN  Inject 7 units at breakfast and lunch and 10 units at dinner     insulin glargine 100 UNIT/ML injection  Commonly known  as:  LANTUS  Inject 20 Units into the skin every other day.     mupirocin ointment 2 %  Commonly known as:  BACTROBAN  Apply 1 application topically daily. Applied to affected heal     oxyCODONE-acetaminophen 5-325 MG per tablet  Commonly known as:  PERCOCET/ROXICET  Take 2 tablets by mouth every 6 (six) hours as needed for moderate pain.     pravastatin 40 MG tablet  Commonly known as:  PRAVACHOL  Take 1 tablet (40 mg total) by mouth daily.     RELION INSULIN SYR 0.3ML/31G 31G X 5/16" 0.3 ML Misc  Generic drug:  Insulin Syringe-Needle U-100     senna-docusate 8.6-50 MG per tablet  Commonly known as:  Senokot-S  Take 1 tablet by mouth at bedtime as needed for mild constipation.     Vitamin D (Ergocalciferol) 50000 UNITS Caps capsule  Commonly known as:  DRISDOL  Take 1 capsule (50,000 Units total) by mouth every 7 (seven) days.       No Known Allergies     Follow-up Information   Follow up with Doris CheadleADVANI, DEEPAK, MD On 02/24/2014. (appointment at 10:00 AM, please  check your appoinment.)    Specialty:  Internal Medicine   Contact information:   [address] [place] [address] [phone]       Follow up with Loreta AveMURPHY,DANIEL F, MD. Schedule an appointment as soon as possible for a visit in 2 weeks.   Specialty:  Orthopedic Surgery   Contact information:   [address] [place] [address] [phone]        The results of significant diagnostics from this hospitalization (including imaging, microbiology, ancillary and laboratory) are listed below for reference.    Significant Diagnostic Studies: Ct Angio Chest Pe W/cm &/or Wo Cm  02/08/2014   CLINICAL DATA:  Shortness of breath. Cough. Left lower extremity DVT.  EXAM: CT ANGIOGRAPHY CHEST WITH CONTRAST  TECHNIQUE: Multidetector CT imaging of the chest was performed using the standard protocol during bolus administration of intravenous contrast. Multiplanar CT image reconstructions and MIPs  were obtained to evaluate the vascular anatomy.  CONTRAST:  100mL OMNIPAQUE IOHEXOL 350 MG/ML SOLN  COMPARISON:  None.  FINDINGS: Pulmonary emboli are seen in the distal right and left pulmonary arteries as well as all lobar pulmonary artery branches, more extensive in the right pulmonary arteries than the left.  No evidence of thoracic aortic aneurysm or dissection. No evidence of pleural or pericardial effusion. No evidence of mass or lymphadenopathy.  Mild dependent atelectasis is seen bilaterally, however there is no evidence of pulmonary airspace disease or mass. No evidence of pleural or pericardial effusion.  Review of the MIP images confirms the above findings.  IMPRESSION: Positive for bilateral pulmonary embolism. RV/ LV ratio is calculated at 0.88, which is not considered increased.  Critical Value/emergent results were called by telephone at the time of interpretation on 02/08/2014 at 10:08 AM to Dr. Glynn OctaveSTEPHEN RANCOUR , who verbally acknowledged these results.   Electronically Signed   By: Myles RosenthalJohn  Stahl M.D.   On: 02/08/2014 10:14    Microbiology: No results found for this or any previous visit (from the past 240 hour(s)).   Labs: Basic Metabolic Panel:  Recent Labs Lab 02/08/14 0512 02/09/14 0225  NA 140 136*  K 4.0 3.5*  CL 100 99  CO2 27 26  GLUCOSE 83 147*  BUN 10 8  CREATININE 0.76 0.70  CALCIUM 9.0 8.2*   Liver Function Tests: No results found for this basename: AST, ALT, ALKPHOS, BILITOT, PROT, ALBUMIN,  in the last 168 hours No results found for this basename: LIPASE, AMYLASE,  in the last 168 hours No results found for this basename: AMMONIA,  in the last 168 hours CBC:  Recent Labs Lab 02/08/14 0512 02/09/14 0225 02/10/14 1015  WBC 11.6* 9.5 9.9  NEUTROABS 7.5  --   --   HGB 13.8 12.9* 13.3  HCT 41.1 38.3* 39.9  MCV 91.5 91.2 91.7  PLT 298 261 273   Cardiac Enzymes:  Recent Labs Lab 02/08/14 1042  TROPONINI <0.30   BNP: BNP (last 3 results) No results  found for this basename: PROBNP,  in the last 8760 hours CBG:  Recent Labs Lab 02/09/14 1130 02/09/14 1715 02/09/14 2159 02/10/14 0733 02/10/14 1210  GLUCAP 158* 136* 242* 109* 141*       Signed:  Navi Erber  [facility] 02/10/2014, 3:01 PM

## 2014-02-10 NOTE — Progress Notes (Signed)
Demonstrated good technique in giving self lovenox.

## 2014-02-10 NOTE — Progress Notes (Signed)
ANTICOAGULATION CONSULT NOTE - Follow Up Consult  Pharmacy Consult for Heparin Indication: pulmonary embolus and DVT  No Known Allergies  Patient Measurements: Height: 6' (182.9 cm) Weight: 180 lb 12.4 oz (82 kg) IBW/kg (Calculated) : 77.6 Heparin Dosing Weight:   Vital Signs: Temp: 98.4 F (36.9 C) (06/30 2156) Temp src: Oral (06/30 2156) BP: 130/84 mmHg (06/30 2156) Pulse Rate: 88 (06/30 2156)  Labs:  Recent Labs  02/08/14 0512 02/08/14 1042  02/09/14 0225 02/09/14 0943 02/09/14 1649 02/10/14 0150  HGB 13.8  --   --  12.9*  --   --   --   HCT 41.1  --   --  38.3*  --   --   --   PLT 298  --   --  261  --   --   --   LABPROT 14.0  --   --   --  13.8  --  13.7  INR 1.08  --   --   --  1.06  --  1.05  HEPARINUNFRC  --   --   < > 0.32 0.26* 0.23* 0.28*  CREATININE 0.76  --   --  0.70  --   --   --   TROPONINI  --  <0.30  --   --   --   --   --   < > = values in this interval not displayed.  Estimated Creatinine Clearance: 119.9 ml/min (by C-G formula based on Cr of 0.7).   Medications:  Infusions:  . heparin 1,750 Units/hr (02/10/14 0318)    Assessment: Patient with heparin level low.  No issues per RN.  Goal of Therapy:  Heparin level 0.3-0.7 units/ml Monitor platelets by anticoagulation protocol: Yes   Plan:  Increase heparin to 1750 units/hr Recheck level at 100  [address] Glenlake [address]Grimsley Jr, TuttletownJulian Crowford 02/10/2014,3:18 AM

## 2014-02-10 NOTE — Progress Notes (Addendum)
ANTICOAGULATION CONSULT NOTE - Follow Up Consult  Pharmacy Consult for Heparin/Warfarin Indication: PE & DVT  No Known Allergies  Patient Measurements: Height: 6' (182.9 cm) Weight: 180 lb 12.4 oz (82 kg) IBW/kg (Calculated) : 77.6  Vital Signs: Temp: 98.3 F (36.8 C) (07/01 0456) Temp src: Oral (07/01 0456) BP: 144/78 mmHg (07/01 0456) Pulse Rate: 73 (07/01 0456)  Labs:  Recent Labs  02/08/14 0512 02/08/14 1042  02/09/14 0225 02/09/14 0943 02/09/14 1649 02/10/14 0150  HGB 13.8  --   --  12.9*  --   --   --   HCT 41.1  --   --  38.3*  --   --   --   PLT 298  --   --  261  --   --   --   LABPROT 14.0  --   --   --  13.8  --  13.7  INR 1.08  --   --   --  1.06  --  1.05  HEPARINUNFRC  --   --   < > 0.32 0.26* 0.23* 0.28*  CREATININE 0.76  --   --  0.70  --   --   --   TROPONINI  --  <0.30  --   --   --   --   --   < > = values in this interval not displayed.  Estimated Creatinine Clearance: 119.9 ml/min (by C-G formula based on Cr of 0.7).   Medications:  Scheduled:  . insulin aspart  0-5 Units Subcutaneous QHS  . insulin aspart  0-9 Units Subcutaneous TID WC  . insulin glargine  20 Units Subcutaneous QHS  . mupirocin ointment  1 application Topical Daily  . simvastatin  10 mg Oral q1800  . sodium chloride  3 mL Intravenous Q12H  . [START ON 02/15/2014] Vitamin D (Ergocalciferol)  50,000 Units Oral Q7 days  . Warfarin - Pharmacist Dosing Inpatient   Does not apply q1800   Infusions:  . heparin 1,750 Units/hr (02/10/14 [id number])    Assessment: 53 year old male presents to the emergency department with complaint of left leg pain. 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 Positive for L leg DVT and bilateral PE. Pharmacy consulted to dose heparin drip and warfarin.  7/1 Heparin level this am = 0.28 (subtherapeutic) - on heparin 1550 units/hr (rate increased this am) 7/1 INR = 1.05 (subtherapeutic) following warfarin 7.5mg  x 2   No  complications or bleeding noted  Patient on regular diet with good meal intake  On ibuprofen PRN - no administrations  Goal of Therapy:  INR 2-3 Heparin level 0.3-0.7 units/ml Monitor platelets by anticoagulation protocol: Yes   Plan:  Day #3 of minimum 5 day (need INR > 2) overlap for acute VTE   Heparin rate increased this am for low heparin level, rechecking at 10am  Give warfarin 10mg  po x1 today as INR has moved  Daily CBC not ordered - check w/ 10am labs  Educate patient  Juliette Alcideustin Lennie Dunnigan, PharmD, BCPS.   Pager: [phone] 02/10/2014 8:29 AM    Addendum   Heparin level check following rate increase = 0.53 (goal 0.3-0.7) CBC: stable  Plan: continue heparin gtt at current rate of 1750 units/hr as level is therapeutic.  Check level this evening to confirm.  Possible plans (checking with insurance) to switch to enoxaparin and discharge to complete overlap therapy.   Juliette Alcideustin Jerlene Rockers, PharmD, BCPS.   Pager: [phone] 02/10/2014 10:50 AM

## 2014-02-15 NOTE — Telephone Encounter (Signed)
Patient would like to know if his meds Lovenox  120 mg time a day,  would interfere with his insulin 1 unit novalog, please advise

## 2014-02-15 NOTE — Telephone Encounter (Signed)
Please see below and advise.

## 2014-02-15 NOTE — Telephone Encounter (Signed)
No

## 2014-02-18 ENCOUNTER — Telehealth: Payer: Self-pay | Admitting: Endocrinology

## 2014-02-18 ENCOUNTER — Telehealth: Payer: Self-pay | Admitting: *Deleted

## 2014-02-18 NOTE — Telephone Encounter (Signed)
No, increase NovoLog to 12 units

## 2014-02-18 NOTE — Telephone Encounter (Signed)
Patient states he takes his lovanox around 5 pm every day, 2 hours later he takes 10 units of his insulin, he said 4-5 hours after eating/taking insulin his sugar stays up around 199, 201, 173.  He wants to know if the Lovanox could be interfering with  His insulin?  Please advise

## 2014-02-18 NOTE — Telephone Encounter (Signed)
Ever since pt got discharged from hospital his sugar has been high and staying high for up to 3 hrs

## 2014-02-18 NOTE — Telephone Encounter (Signed)
Noted patient is aware 

## 2014-02-24 ENCOUNTER — Ambulatory Visit: Payer: Managed Care, Other (non HMO) | Attending: Internal Medicine | Admitting: Internal Medicine

## 2014-02-24 ENCOUNTER — Encounter: Payer: Self-pay | Admitting: Internal Medicine

## 2014-02-24 VITALS — BP 120/84 | HR 78 | Temp 98.8°F | Resp 16

## 2014-02-24 DIAGNOSIS — I82409 Acute embolism and thrombosis of unspecified deep veins of unspecified lower extremity: Secondary | ICD-10-CM | POA: Insufficient documentation

## 2014-02-24 DIAGNOSIS — M79605 Pain in left leg: Secondary | ICD-10-CM

## 2014-02-24 DIAGNOSIS — E119 Type 2 diabetes mellitus without complications: Secondary | ICD-10-CM | POA: Insufficient documentation

## 2014-02-24 DIAGNOSIS — I82402 Acute embolism and thrombosis of unspecified deep veins of left lower extremity: Secondary | ICD-10-CM

## 2014-02-24 DIAGNOSIS — Z79899 Other long term (current) drug therapy: Secondary | ICD-10-CM | POA: Insufficient documentation

## 2014-02-24 DIAGNOSIS — I2699 Other pulmonary embolism without acute cor pulmonale: Secondary | ICD-10-CM | POA: Insufficient documentation

## 2014-02-24 DIAGNOSIS — M79609 Pain in unspecified limb: Secondary | ICD-10-CM

## 2014-02-24 DIAGNOSIS — E139 Other specified diabetes mellitus without complications: Secondary | ICD-10-CM

## 2014-02-24 DIAGNOSIS — E089 Diabetes mellitus due to underlying condition without complications: Secondary | ICD-10-CM

## 2014-02-24 LAB — GLUCOSE, POCT (MANUAL RESULT ENTRY): POC Glucose: 162 mg/dl — AB (ref 70–99)

## 2014-02-24 MED ORDER — GABAPENTIN 100 MG PO CAPS: 300.0000 mg | ORAL_CAPSULE | Freq: Three times a day (TID) | ORAL | Status: DC

## 2014-02-24 NOTE — Progress Notes (Signed)
MRN: [id number] Name: Gregory May  Sex: male Age: 53 y.o. DOB: 08/29/1960  Allergies: Review of patient's allergies indicates no known allergies.  Chief Complaint  Patient presents with  . Follow-up    HPI: Patient is 53 y.o. male who has history of diabetes the referral to also hypertension, recently hospitalized with symptoms of left leg pain and shortness of breath, patient was diagnosed with left lower leg DVT as well as bilateral PE, patient was initially started on IV heparin drip and was discharged on Lovenox to continue for 6 months, patient symptomatically improved was given pain medication for leg pain., Also last arthritis of left hip following up with orthopedic surgery, his diabetes is being managed by his endocrinologist, denies any hypoglycemic symptoms, patient requesting refill on pain medication.  Past Medical History  Diagnosis Date  . Sebaceous cyst     on back of neck  . Diabetes mellitus     type II    Past Surgical History  Procedure Laterality Date  . Rotator cuff repair  2005 (approx)    right   . Closed reduction with humer pin insertion  1974    left hip  . 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  Always use your most recent med list.               bag balm Oint ointment  Apply 1 application topically as needed for dry skin.     enoxaparin 120 MG/0.8ML injection  Commonly known as:  LOVENOX  Inject 0.8 mLs (120 mg total) into the skin daily.     gabapentin 100 MG capsule  Commonly known as:  NEURONTIN  Take 3 capsules (300 mg total) by mouth 3 (three) times daily.     ibuprofen 200 MG tablet  Commonly known as:  ADVIL,MOTRIN  Take 400 mg by mouth every 6 (six) hours as needed (pain).     insulin aspart 100 UNIT/ML FlexPen  Commonly known as:  NOVOLOG FLEXPEN  Inject 7 units at breakfast and lunch and 10 units at dinner     insulin glargine 100 UNIT/ML injection  Commonly known as:  LANTUS  Inject 20 Units into the skin every other day.     mupirocin ointment 2 %  Commonly known as:  BACTROBAN  Apply 1 application topically daily. Applied to affected heal     oxyCODONE-acetaminophen 5-325 MG per tablet  Commonly known as:  PERCOCET/ROXICET  Take 2 tablets by mouth every 6 (six) hours as needed for moderate pain.     pravastatin 40 MG tablet  Commonly known as:  PRAVACHOL  Take 1 tablet (40 mg total) by mouth daily.     RELION INSULIN SYR 0.3ML/31G 31G X 5/16" 0.3 ML Misc  Generic drug:  Insulin Syringe-Needle U-100     senna-docusate 8.6-50 MG per tablet  Commonly known as:  Senokot-S  Take 1 tablet by mouth at bedtime as needed for mild constipation.     Vitamin D (Ergocalciferol) 50000 UNITS Caps capsule  Commonly known as:  DRISDOL  Take 1 capsule (50,000 Units total) by mouth every 7 (seven) days.        Meds ordered this encounter  Medications  . gabapentin (NEURONTIN) 100 MG capsule    Sig: Take 3 capsules (300 mg total) by mouth 3 (three) times  daily.    Dispense:  90 capsule    Refill:  1    Immunization History  Administered Date(s) Administered  . Tetanus 11/05/2013    Family History  Problem Relation Age of Onset  . Cancer Mother     breast, colon, liver  . Diabetes Father     History  Substance Use Topics  . Smoking status: Never Smoker   . Smokeless tobacco: Never Used  . Alcohol Use: Yes     Comment: occasional    Review of Systems   As noted in HPI  Filed Vitals:   02/24/14 1010  BP: 120/84  Pulse: 78  Temp: 98.8 F (37.1 C)  Resp: 16    Physical Exam  Physical Exam  Constitutional: No distress.  Eyes: EOM are normal. Pupils are equal, round, and reactive to light.  Cardiovascular: Normal rate and regular rhythm.   Pulmonary/Chest: Breath sounds normal. No respiratory distress. He has no wheezes. He has no rales.  Musculoskeletal:  Minimal  tenderness in left calf, no erythema or warmth to touch     CBC    Component Value Date/Time   WBC 9.9 02/10/2014 1015   RBC 4.35 02/10/2014 1015   HGB 13.3 02/10/2014 1015   HCT 39.9 02/10/2014 1015   PLT 273 02/10/2014 1015   MCV 91.7 02/10/2014 1015   LYMPHSABS 2.7 02/08/2014 0512   MONOABS 1.3* 02/08/2014 0512   EOSABS 0.2 02/08/2014 0512   BASOSABS 0.0 02/08/2014 0512    CMP     Component Value Date/Time   NA 136* 02/09/2014 0225   K 3.5* 02/09/2014 0225   CL 99 02/09/2014 0225   CO2 26 02/09/2014 0225   GLUCOSE 147* 02/09/2014 0225   BUN 8 02/09/2014 0225   CREATININE 0.70 02/09/2014 0225   CALCIUM 8.2* 02/09/2014 0225   PROT 6.8 01/20/2014 1344   ALBUMIN 3.4* 01/20/2014 1344   AST 26 01/20/2014 1344   ALT 30 01/20/2014 1344   ALKPHOS 94 01/20/2014 1344   BILITOT 1.0 01/20/2014 1344   GFRNONAA >90 02/09/2014 0225   GFRAA >90 02/09/2014 0225    Lab Results  Component Value Date/Time   CHOL 155 11/05/2013  2:49 PM    No components found with this basename: hga1c    Lab Results  Component Value Date/Time   AST 26 01/20/2014  1:44 PM    Assessment and Plan  Diabetes mellitus due to underlying condition without complications - Plan:  Results for orders placed in visit on 02/24/14  GLUCOSE, POCT (MANUAL RESULT ENTRY)      Result Value Ref Range   POC Glucose 162 (*) 70 - 99 mg/dl   His last Z6X was 0.9%, diabetes is well controlled he'll continue with his current treatment plan and already following up with his endocrinologist.  DVT (deep venous thrombosis), left/Pulmonary emboli  patient is currently on Lovenox injections, the duration is likely to be 6 months, patient is more concerned about his left hip surgery , discussed about getting to see the hematologist for further recommendation if needed.  Leg pain, central, left - Plan: Discussed with patient that  we don't prescribe narcotic medications he's going to try gabapentin (NEURONTIN) 100 MG capsule.  Return in about 3 months  (around 05/27/2014), or if symptoms worsen or fail to improve.  Doris Cheadle, MD

## 2014-02-24 NOTE — Progress Notes (Signed)
Patient here for follow up  Recently diagnosed with pulmonary embolism Currently on lovenox

## 2014-02-25 ENCOUNTER — Telehealth: Payer: Self-pay | Admitting: Internal Medicine

## 2014-02-25 NOTE — Telephone Encounter (Signed)
Pt was seen on 02/24/14 and says that the script directions on bottles do not match the script directions on AVS and not sure which to follow. Please f/u with pt with more info.

## 2014-03-09 ENCOUNTER — Telehealth: Payer: Self-pay

## 2014-03-09 NOTE — Telephone Encounter (Signed)
Patient returned call and states spoke with his insurance company and  Benefits should be reinstated today.  Patient stated he should be able to get  His medications .  I told him if there was any problems to please call and we will  Help him anyway we could

## 2014-03-09 NOTE — Telephone Encounter (Signed)
Patient called because as of wed of this week he will be out of his lovenex There was a problem with his insurance and policy has lapsed Pre mr Such he paid the premium last night i lold him we need an exact date that the insurance will be reinstated so that  We can try to help him obtain his medications during the gap

## 2014-03-09 NOTE — Telephone Encounter (Signed)
Pt. Has questions regarding medications....call transferred to nurse

## 2014-03-15 ENCOUNTER — Telehealth: Payer: Self-pay

## 2014-03-15 NOTE — Telephone Encounter (Signed)
Returned patient phone call Patient is at another appointment and will have To call me back

## 2014-03-30 ENCOUNTER — Telehealth: Payer: Self-pay

## 2014-03-30 NOTE — Telephone Encounter (Signed)
Spoke with patient in regards to his lovenex and made Him aware of what Dr Harolyn RutherfordAdvani advised-to continue lovenex and monitor The injection sites. Patient stated was having minimal bleeding at the injection site

## 2014-03-30 NOTE — Telephone Encounter (Signed)
Patient called concerned about his lovenox injections The last three injections site had some slight bleeding Informed patient to monitor the sites and if it gets worse to Be seen in the office. This was discussed with Dr Orpah CobbAdvani as well

## 2014-04-02 ENCOUNTER — Other Ambulatory Visit (HOSPITAL_COMMUNITY): Payer: Self-pay

## 2014-04-02 DIAGNOSIS — M7989 Other specified soft tissue disorders: Secondary | ICD-10-CM

## 2014-04-07 ENCOUNTER — Ambulatory Visit (HOSPITAL_COMMUNITY): Payer: Managed Care, Other (non HMO) | Attending: Cardiovascular Disease | Admitting: *Deleted

## 2014-04-07 ENCOUNTER — Encounter (HOSPITAL_COMMUNITY): Payer: Managed Care, Other (non HMO)

## 2014-04-07 DIAGNOSIS — I87009 Postthrombotic syndrome without complications of unspecified extremity: Secondary | ICD-10-CM

## 2014-04-07 DIAGNOSIS — I82819 Embolism and thrombosis of superficial veins of unspecified lower extremities: Secondary | ICD-10-CM | POA: Diagnosis not present

## 2014-04-07 DIAGNOSIS — M7989 Other specified soft tissue disorders: Secondary | ICD-10-CM

## 2014-04-07 NOTE — Progress Notes (Signed)
Left lower extremity venous duplex complete  

## 2014-04-13 ENCOUNTER — Telehealth: Payer: Self-pay

## 2014-04-13 NOTE — Telephone Encounter (Signed)
Patient called concerned  About his left leg, The calf and knee are swollen and the knee gets stiff Patient is currently on medication for blood clots Instructed patient to come in afternoon for walk in visit and to be triaged with the nurse

## 2014-04-23 ENCOUNTER — Other Ambulatory Visit (INDEPENDENT_AMBULATORY_CARE_PROVIDER_SITE_OTHER): Payer: Managed Care, Other (non HMO)

## 2014-04-23 ENCOUNTER — Telehealth: Payer: Self-pay | Admitting: Internal Medicine

## 2014-04-23 DIAGNOSIS — E118 Type 2 diabetes mellitus with unspecified complications: Secondary | ICD-10-CM

## 2014-04-23 DIAGNOSIS — E1165 Type 2 diabetes mellitus with hyperglycemia: Secondary | ICD-10-CM

## 2014-04-23 LAB — MICROALBUMIN / CREATININE URINE RATIO
Creatinine,U: 135.7 mg/dL
Microalb Creat Ratio: 0.6 mg/g (ref 0.0–30.0)
Microalb, Ur: 0.8 mg/dL (ref 0.0–1.9)

## 2014-04-23 LAB — COMPREHENSIVE METABOLIC PANEL
ALT: 49 U/L (ref 0–53)
AST: 27 U/L (ref 0–37)
Albumin: 4 g/dL (ref 3.5–5.2)
Alkaline Phosphatase: 74 U/L (ref 39–117)
BUN: 12 mg/dL (ref 6–23)
CO2: 31 mEq/L (ref 19–32)
Calcium: 9.5 mg/dL (ref 8.4–10.5)
Chloride: 100 mEq/L (ref 96–112)
Creatinine, Ser: 1 mg/dL (ref 0.4–1.5)
GFR: 81.66 mL/min (ref >60.00)
Glucose, Bld: 179 mg/dL — ABNORMAL HIGH (ref 70–99)
Potassium: 4.1 mEq/L (ref 3.5–5.1)
Sodium: 137 mEq/L (ref 135–145)
Total Bilirubin: 0.5 mg/dL (ref 0.2–1.2)
Total Protein: 7.8 g/dL (ref 6.0–8.3)

## 2014-04-23 LAB — HEMOGLOBIN A1C: Hgb A1c MFr Bld: 6.3 % (ref 4.6–6.5)

## 2014-04-23 NOTE — Telephone Encounter (Signed)
Pt. Came in asking if he could be triaged by nurse for the stiffness on his  Leg..per nurse pt. Should return Monday afternoon and ask to see Center. Pt. Also asked if it would be possible to get refill on medication gabapentin (NEURONTIN) 100 MG capsule [[id number]]...Marland KitchenMarland Kitchen

## 2014-04-27 ENCOUNTER — Encounter: Payer: Self-pay | Admitting: Endocrinology

## 2014-04-27 ENCOUNTER — Ambulatory Visit (INDEPENDENT_AMBULATORY_CARE_PROVIDER_SITE_OTHER): Payer: Managed Care, Other (non HMO) | Admitting: Endocrinology

## 2014-04-27 VITALS — BP 136/82 | HR 70 | Temp 97.7°F | Resp 14 | Ht 72.0 in | Wt 187.8 lb

## 2014-04-27 DIAGNOSIS — E118 Type 2 diabetes mellitus with unspecified complications: Secondary | ICD-10-CM

## 2014-04-27 DIAGNOSIS — E7800 Pure hypercholesterolemia, unspecified: Secondary | ICD-10-CM

## 2014-04-27 DIAGNOSIS — E1165 Type 2 diabetes mellitus with hyperglycemia: Secondary | ICD-10-CM

## 2014-04-27 NOTE — Patient Instructions (Addendum)
Try Lantus 15 at supper daily as long as am sugar stays in 80-120  With shot check 3x daily  Bayer, Accucheck, Freestyle brands of Test strips

## 2014-04-27 NOTE — Progress Notes (Signed)
Gregory May 53 y.o.    Reason for Appointment: Diabetes follow-up   History of Present Illness   Diagnosis: Type 2 DIABETES MELITUS, date of diagnosis:  1999     Previous history: He has previously been treated with metformin and Victoza and subsequently mealtime insulin added to control postprandial hyperglycemia Overall he has been  relatively noncompliant with his diet, medications, monitoring and followup Previously would not take Victoza regularly because of cost. Has not been taking any medications for the last year and a half because of commitments to family and cost A1c had increased to 12.3% and hyperglycemia discovered when he was hospitalized for foot ulcer. Also lost 20 pounds because of hyperglycemia   Recent history:  His blood sugars appear to be overall consistently well controlled  since about 10/2013 He has been usually careful  with his diet  His weight has increased relatively but A1c is still in the upper normal range with current regimen of basal bolus insulin However he thinks that he is forgetting to take his Lantus at bedtime and is taking it on and off. He had the same difficulty on his last visit and was advised to take it at suppertime but he is still not doing so Also his Lantus was reduced by 5 units on the last visit However his fasting blood sugars do not appear to be high on any day on his download Mostly checking blood sugars at bedtime before his snack; he is eating more ice cream at night but not clear if he is has higher readings postprandially     Insulin regimen: Lantus: 20 units at bedtime   Novolog 8-12 units a.c.2-3x daily       Proper timing of injections in relation to meals:  usually   Oral hypoglycemic drugs: None        Side effects from medications: None       Monitors blood glucose: Once a day.    Glucometer: One Touch.          Blood Glucose readings   PREMEAL Breakfast Lunch Dinner Bedtime Overall  Glucose range:  106-135     142   90-107  96-201    median:     158  117   Hypoglycemia: None recently  Meals:  usually 2 meals per dayusually .  breakfast at 12 noon, evening meal 7 pm         Physical activity: exercise:None y            Weight control:   Wt Readings from Last 3 Encounters:  04/27/14 187 lb 12.8 oz (85.186 kg)  02/08/14 180 lb 12.4 oz (82 kg)  01/25/14 184 lb 9.6 oz (83.734 kg)          Complications: Diabetic foot ulcer, Now healed  Diabetes labs:  Lab Results  Component Value Date   HGBA1C 6.3 04/23/2014   HGBA1C 6.5 01/20/2014   HGBA1C 12.3* 09/15/2013   Lab Results  Component Value Date   MICROALBUR 0.8 04/23/2014   LDLCALC 98 11/05/2013   CREATININE 1.0 04/23/2014       Medication List       This list is accurate as of: 04/27/14  3:50 PM.  Always use your most recent med list.               bag balm Oint ointment  Apply 1 application topically as needed for dry skin.     enoxaparin 120 MG/0.8ML injection  Commonly known  as:  LOVENOX  Inject 0.8 mLs (120 mg total) into the skin daily.     gabapentin 100 MG capsule  Commonly known as:  NEURONTIN  Take 3 capsules (300 mg total) by mouth 3 (three) times daily.     HYDROcodone-acetaminophen 5-325 MG per tablet  Commonly known as:  NORCO/VICODIN     insulin aspart 100 UNIT/ML FlexPen  Commonly known as:  NOVOLOG FLEXPEN  Inject 7 units at breakfast and lunch and 10 units at dinner     insulin glargine 100 UNIT/ML injection  Commonly known as:  LANTUS  Inject 20 Units into the skin every other day.     mupirocin ointment 2 %  Commonly known as:  BACTROBAN  Apply 1 application topically daily. Applied to affected heal     oxyCODONE-acetaminophen 5-325 MG per tablet  Commonly known as:  PERCOCET/ROXICET  Take 2 tablets by mouth every 6 (six) hours as needed for moderate pain.     pravastatin 40 MG tablet  Commonly known as:  PRAVACHOL  Take 1 tablet (40 mg total) by mouth daily.     RELION INSULIN SYR 0.3ML/31G  31G X 5/16" 0.3 ML Misc  Generic drug:  Insulin Syringe-Needle U-100     Vitamin D (Ergocalciferol) 50000 UNITS Caps capsule  Commonly known as:  DRISDOL  Take 1 capsule (50,000 Units total) by mouth every 7 (seven) days.        Allergies: No Known Allergies  Past Medical History  Diagnosis Date  . Sebaceous cyst     on back of neck  . Diabetes mellitus     type II    Past Surgical History  Procedure Laterality Date  . Rotator cuff repair  2005 (approx)    right   . Closed reduction with humer pin insertion  1974    left hip  . Joint replacement  2006 (approx)    right hip replaced    Family History  Problem Relation Age of Onset  . Cancer Mother     breast, colon, liver  . Diabetes Father     Social History:  reports that he has never smoked. He has never used smokeless tobacco. He reports that he drinks alcohol. He reports that he does not use illicit drugs.  Review of Systems:  Hypertension:  not  on treatment he  Lipids: He is on Pravachol with good control of LDLas of 3/15    Lab Results  Component Value Date   CHOL 155 11/05/2013   HDL 46 11/05/2013   LDLCALC 98 11/05/2013   TRIG 53 11/05/2013   CHOLHDL 3.4 11/05/2013    Continues to be followed by his orthopedic surgeon for foot ulcer which  has now healed completely  He is now having significant hip pain and his hip replacement Has been postponed because of DVT in his left upper leg and has had to take Lovenox He apparently is going to get a steroid injection in the hip joint   Examination:   BP 136/82  Pulse 70  Temp(Src) 97.7 F (36.5 C)  Resp 14  Ht 6' (1.829 m)  Wt 187 lb 12.8 oz (85.186 kg)  BMI 25.46 kg/m2  SpO2 95%  Body mass index is 25.46 kg/(m^2).   He has swelling in his left thigh area but minimal ankle edema on the left side  ASSESSMENT/ PLAN:    Diabetes type 2:  Blood glucose control has been  again well-controlled withusually a good diet  and  using basal bolus  insulin Surprisinglyagain  he does not appear to have higher readings  overnight even  though he misses his evening Lantus dose Periodically  Is also fairly compliant with his mealtime coverage and  no recent  hypoglycemia  Does have some high readings after supper if he has more carbohydrate or pasta  A1c is  again  in the normal range   Recommendations made today:  He can  try taking  15  Lantus but do it at suppertime for better compliance   will need to check much more often after his intra-articular steroid injection   also may need higher dose when he has a steroid injection for up to one week   discussed adjusting NovoLog when he has a steroid injection and then probably needing to take it up to 3 times a day  More consistent monitoring after breakfast and lunch  May take 3-4 units of NovoLog for his late night ice cream snack  He will call if he has any difficulties after his hip surgery  Hyperlipidemia: Well controlled as of 3/15And will recheck on the next visit    Counseling time over 50% of today's 25 minute visit  Nadiyah Zeis 04/27/2014, 3:50 PM

## 2014-05-03 ENCOUNTER — Telehealth: Payer: Self-pay

## 2014-05-03 ENCOUNTER — Telehealth: Payer: Self-pay | Admitting: *Deleted

## 2014-05-03 ENCOUNTER — Other Ambulatory Visit: Payer: Self-pay

## 2014-05-03 MED ORDER — ENOXAPARIN SODIUM 120 MG/0.8ML ~~LOC~~ SOLN: 120.0000 mg | SUBCUTANEOUS | Status: DC

## 2014-05-03 NOTE — Telephone Encounter (Signed)
Message left on triage phone to call pt - states has blood clots and pt of Dr Cyndie Chime. Return call to pt 4:40PM - left message that [facility] return his call. Stanton Kidney Jesiah Grismer RN 05/03/14 4:40PM

## 2014-05-03 NOTE — Telephone Encounter (Signed)
Patient called requesting refill on lovenox Prescription sent to the [facility] on file

## 2014-05-18 ENCOUNTER — Encounter: Payer: Self-pay | Admitting: Dietician

## 2014-05-18 ENCOUNTER — Ambulatory Visit (INDEPENDENT_AMBULATORY_CARE_PROVIDER_SITE_OTHER): Payer: Managed Care, Other (non HMO) | Admitting: Internal Medicine

## 2014-05-18 ENCOUNTER — Encounter: Payer: Self-pay | Admitting: Internal Medicine

## 2014-05-18 VITALS — BP 127/75 | HR 61 | Temp 97.7°F | Ht 72.0 in | Wt 183.2 lb

## 2014-05-18 DIAGNOSIS — I2699 Other pulmonary embolism without acute cor pulmonale: Secondary | ICD-10-CM

## 2014-05-18 DIAGNOSIS — I82402 Acute embolism and thrombosis of unspecified deep veins of left lower extremity: Secondary | ICD-10-CM

## 2014-05-18 DIAGNOSIS — E118 Type 2 diabetes mellitus with unspecified complications: Secondary | ICD-10-CM

## 2014-05-18 DIAGNOSIS — E11621 Type 2 diabetes mellitus with foot ulcer: Secondary | ICD-10-CM

## 2014-05-18 DIAGNOSIS — M1612 Unilateral primary osteoarthritis, left hip: Secondary | ICD-10-CM

## 2014-05-18 DIAGNOSIS — L97519 Non-pressure chronic ulcer of other part of right foot with unspecified severity: Secondary | ICD-10-CM

## 2014-05-18 DIAGNOSIS — E1165 Type 2 diabetes mellitus with hyperglycemia: Secondary | ICD-10-CM

## 2014-05-18 LAB — CBC
HCT: 45.6 % (ref 39.0–52.0)
Hemoglobin: 15.5 g/dL (ref 13.0–17.0)
MCH: 31.6 pg (ref 26.0–34.0)
MCHC: 34 g/dL (ref 30.0–36.0)
MCV: 92.9 fL (ref 78.0–100.0)
Platelets: 317 10*3/uL (ref 150–400)
RBC: 4.91 MIL/uL (ref 4.22–5.81)
RDW: 15.9 % — ABNORMAL HIGH (ref 11.5–15.5)
WBC: 8.7 10*3/uL (ref 4.0–10.5)

## 2014-05-18 MED ORDER — HYDROCODONE-ACETAMINOPHEN 5-325 MG PO TABS: 1.0000 | ORAL_TABLET | Freq: Four times a day (QID) | ORAL | Status: DC | PRN

## 2014-05-18 NOTE — Patient Instructions (Signed)
General Instructions: Please come back in one month Please cont with you medications.  Please bring your medicines with you each time you come to clinic.  Medicines may include prescription medications, over-the-counter medications, herbal remedies, eye drops, vitamins, or other pills.   Progress Toward Treatment Goals:  Treatment Goal 05/18/2014  Hemoglobin A1C at goal    Self Care Goals & Plans:  Self Care Goal 05/18/2014  Manage my medications take my medicines as prescribed; bring my medications to every visit; refill my medications on time; follow the sick day instructions if I am sick  Monitor my health keep track of my blood glucose; keep track of my weight; check my feet daily  Eat healthy foods eat more vegetables; eat fruit for snacks and desserts; eat foods that are low in salt; eat smaller portions; drink diet soda or water instead of juice or soda  Be physically active find an activity I enjoy    Home Blood Glucose Monitoring 05/18/2014  Check my blood sugar 3 times a day  When to check my blood sugar before breakfast; before lunch; before dinner     Care Management & Community Referrals:  Referral 05/18/2014  Referrals made for care management support none needed

## 2014-05-18 NOTE — Assessment & Plan Note (Addendum)
See overview for some details.  After discussion of risks and benefits of Lovenox, Coumadin, and Xarelto patient opted to continue with Lovenox. Unsure whether insurance could cover Xarelto.  Plan -Continue with Lovenox until 08/12/2014. -Will check CBC for platelets today. - If the duration of therapy is longer than 6 months, patient indicated that he will wish to consider oral anticoagulants. - will contact Dr Eulah PontMurphy for timing of left hip surgery -Followup in one month.

## 2014-05-18 NOTE — Assessment & Plan Note (Signed)
After discussion of risks and benefits of Lovenox, Coumadin, and Xarelto patient opted to continue with Lovenox.  Plan -Continue with Lovenox until 08/12/2014. -Will check CBC for platelets today. - If the duration of therapy is longer than 6 months, patient indicated that he will wish to consider oral anticoagulants. -Followup in one month.

## 2014-05-18 NOTE — Assessment & Plan Note (Signed)
When he follows up with Dr. Eulah PontMurphy of orthopedics. Apparently, he is being considered for hip replacement, but this will have to be discussed further in the setting of his ongoing anticoagulation. Since his PE and DVT were presumed to be from immobilization from left hip pain, it will imperative to have this addressed at the earliest opportunity. Plan. -Will obtain records from Dr. Greig RightMurphy's office. - He has followup in December 2015.

## 2014-05-18 NOTE — Assessment & Plan Note (Signed)
Lab Results  Component Value Date   HGBA1C 6.3 04/23/2014   HGBA1C 6.5 01/20/2014   HGBA1C 12.3* 09/15/2013     Assessment: Diabetes control: good control (HgbA1C at goal) Progress toward A1C goal:  at goal Comments: follows with Dr Lucianne MussKumar.   Plan: Medications:  Continue with Lantus 20 units at bedtime Home glucose monitoring: Frequency: 3 times a day Timing: before breakfast;before lunch;before dinner Instruction/counseling given: reminded to bring medications to each visit and discussed foot care Educational resources provided: brochure;handout Self management tools provided:   Other plans: Followup with endocrinology by Dr. Lucianne MussKumar. Performed Foot Exam today.

## 2014-05-18 NOTE — Progress Notes (Signed)
Patient ID: Gregory May, male   DOB: 1961-01-07, 53 y.o.   MRN: [id number]   Subjective:   HPI: [name] is a 53 y.o. gentleman with well-controlled diabetes, history of DVT, and PE, among other medical problems presents for clinic followup visit and to establish care as a new patient.  Reason(s) for this visit: 1. Diabetes care: A1c 6.3. On Lantus 20 units at bedtime. Follows with Dr. Lucianne Muss of endocrinology. Will do foot exam today.  2. 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 He reports that since he was discharged, his been very compliant with Lovenox injections. I have discussed with him at length the different options for anticoagulation, including the newer oral anticoagulants like, and Coumadin. Given his limitation with left hip pain due to osteoarthritis, patient indicated that it will be difficult for him to come for INR checks. He feels that he'll be able to continue with Lovenox injections. It cost him $30 for 2 weeks supply. He denies any recent bleeding. Recent platelets and renal function, were within normal limits. Patient is being evaluated by orthopedic surgery for the proper timing of his left hip surgery.  3. Left hip OA: He reports continuing pain in his left hip pain. Sees Dr Eulah Pont of ortho and has appt in December. He uses crutches to get by. He also reports that he has significant difficulties getting in and out of the car. He requests for refills of Vicodin.  He has not other complaints today. He wants to establish care here.   ROS: Constitutional: Denies fever, chills, diaphoresis, appetite change and fatigue.  Respiratory: Denies SOB, DOE, cough, chest tightness, and wheezing. Denies chest pain. CVS: No chest pain, palpitations and leg swelling.  GI: No  abdominal pain, nausea, vomiting, bloody stools GU: No dysuria, frequency, hematuria, or flank pain.  MSK: No myalgias, back pain, joint swelling, arthralgias  Psych: No depression symptoms. No SI or SA.   Past Medical History  Diagnosis Date  . Sebaceous cyst     on back of neck  . Diabetes mellitus     type II   Family History  Problem Relation Age of Onset  . Cancer Mother     breast, colon, liver  . Diabetes Father    History   Social History  . Marital Status: Single    Spouse Name: N/A    Number of Children: N/A  . Years of Education: N/A   Social History Main Topics  . Smoking status: Never Smoker   . Smokeless tobacco: Never Used  . Alcohol Use: Yes     Comment: occasional  . Drug Use: No  . Sexual Activity: No   Other Topics Concern  . None   Social History Narrative  . None   Objective:  Physical Exam: Filed Vitals:   05/18/14 1417  BP: 127/75  Pulse: 61  Temp: 97.7 F (36.5 C)  TempSrc: Oral  Height: 6' (1.829 m)  Weight: 183 lb 3.2 oz (83.099 kg)  SpO2: 98%   General: Well nourished. No acute distress.  HEENT: Normal oral mucosa. MMM.  Lungs: CTA bilaterally. Heart: RRR; no extra sounds or murmurs  Abdomen: Non-distended, normal bowel sounds, soft, nontender; no hepatosplenomegaly  Extremities: Right lower extremity with very minimal edema. Pulses are present and strong bilaterally.  Left heel with a small ulcer without signs of active inflammation or infection. Interdigital spaces of both feet are normal without any skin breakdowns or wounds. No pedal edema. No joint swelling or tenderness. Neurologic: Normal EOM,  Alert and oriented x3. No obvious neurologic/cranial nerve deficits.  Assessment & Plan:  Discussed case with my attending in the clinic, Dr. Heide SparkNarendra See problem based charting.

## 2014-05-19 NOTE — Progress Notes (Signed)
INTERNAL MEDICINE TEACHING ATTENDING ADDENDUM - Jenee Spaugh, MD: I reviewed and discussed at the time of visit with the resident Dr. Kazibwe, the patient's medical history, physical examination, diagnosis and results of pertinent tests and treatment and I agree with the patient's care as documented.  

## 2014-05-25 ENCOUNTER — Encounter: Payer: Self-pay | Admitting: Internal Medicine

## 2014-05-25 ENCOUNTER — Ambulatory Visit (INDEPENDENT_AMBULATORY_CARE_PROVIDER_SITE_OTHER): Payer: Managed Care, Other (non HMO) | Admitting: Internal Medicine

## 2014-05-25 VITALS — BP 127/69 | HR 65 | Temp 97.8°F | Ht 72.0 in | Wt 186.4 lb

## 2014-05-25 DIAGNOSIS — M160 Bilateral primary osteoarthritis of hip: Secondary | ICD-10-CM | POA: Insufficient documentation

## 2014-05-25 DIAGNOSIS — L97529 Non-pressure chronic ulcer of other part of left foot with unspecified severity: Secondary | ICD-10-CM

## 2014-05-25 DIAGNOSIS — M162 Bilateral osteoarthritis resulting from hip dysplasia: Secondary | ICD-10-CM

## 2014-05-25 DIAGNOSIS — E118 Type 2 diabetes mellitus with unspecified complications: Secondary | ICD-10-CM

## 2014-05-25 DIAGNOSIS — I82402 Acute embolism and thrombosis of unspecified deep veins of left lower extremity: Secondary | ICD-10-CM

## 2014-05-25 DIAGNOSIS — I2699 Other pulmonary embolism without acute cor pulmonale: Secondary | ICD-10-CM

## 2014-05-25 DIAGNOSIS — E11621 Type 2 diabetes mellitus with foot ulcer: Secondary | ICD-10-CM

## 2014-05-25 MED ORDER — APIXABAN 5 MG PO TABS: ORAL_TABLET | ORAL | Status: DC

## 2014-05-25 NOTE — Progress Notes (Signed)
Patient ID: Gregory May, male   DOB: [birth year], 53 y.o.   MRN: [id number]   Subjective:   HPI: Gregory May is a 53 y.o. gentleman with a past medical history of well-controlled diabetes, history of LLE DVT, and PEs in June 2015 on anticoagulation for 6 months, and bilateral hip osteoarthritis with history of replacement surgeries, presents for followup visit for his anticoagulation therapy.  Last office visit 05/18/2014 where I continued with his Lovenox shots for DVT/PE. Patient wishes to discuss his anticoagulation therapy, and in fact, would like to switch to Apixaban after checking with his insurance.  ROS: Constitutional: Denies fever, chills, diaphoresis, appetite change and fatigue.  Respiratory: Denies SOB, DOE, cough, chest tightness, and wheezing. Denies chest pain. CVS: No chest pain, palpitations and leg swelling.  GI: No abdominal pain, nausea, vomiting, bloody stools GU: No dysuria, frequency, hematuria, or flank pain.  MSK: bilateral hip pains. Uses crutches to get around.  Psych: No depression symptoms. No SI or SA.    Objective:  Physical Exam: Filed Vitals:   05/25/14 1709  BP: 127/69  Pulse: 65  Temp: 97.8 F (36.6 C)  TempSrc: Oral  Height: 6' (1.829 m)  Weight: 186 lb 6.4 oz (84.55 kg)  SpO2: 98%   General: Well nourished. No acute distress. Uses crutches  HEENT: Normal oral mucosa. MMM.  Lungs: CTA bilaterally. Heart: RRR; no extra sounds or murmurs  Abdomen: Non-distended, normal bowel sounds, soft, nontender; no hepatosplenomegaly  Extremities: Left lower extremity is not significantly larger than the right. Did not perform hip maneuvers. Neurologic: Normal EOM,  Alert and oriented x3. No obvious neurologic/cranial nerve deficits.  Assessment & Plan:  Discussed case with my attending in the clinic, Dr. Heide SparkNarendra See problem based charting.

## 2014-05-25 NOTE — Assessment & Plan Note (Signed)
Patient would like to switch to Eliquis per patient preference. Plan -Start Eliquis 10 mg twice daily for the first one week and continue with 5 mg twice daily until discontinued. -Discussed risks and benefits of this medication -Followup in one month

## 2014-05-25 NOTE — Patient Instructions (Addendum)
General Instructions: Please stop lovenox injections Please start Eliquis 10 mg twice daily for 7 days followed by 5 mg twice daily Please follow up in 1 month    Please bring your medicines with you each time you come to clinic.  Medicines may include prescription medications, over-the-counter medications, herbal remedies, eye drops, vitamins, or other pills.   Progress Toward Treatment Goals:  Treatment Goal 05/18/2014  Hemoglobin A1C at goal    Self Care Goals & Plans:  Self Care Goal 05/18/2014  Manage my medications take my medicines as prescribed; bring my medications to every visit; refill my medications on time; follow the sick day instructions if I am sick  Monitor my health keep track of my blood glucose; keep track of my weight; check my feet daily  Eat healthy foods eat more vegetables; eat fruit for snacks and desserts; eat foods that are low in salt; eat smaller portions; drink diet soda or water instead of juice or soda  Be physically active find an activity I enjoy    Home Blood Glucose Monitoring 05/18/2014  Check my blood sugar 3 times a day  When to check my blood sugar before breakfast; before lunch; before dinner     Care Management & Community Referrals:  Referral 05/18/2014  Referrals made for care management support none needed       Apixaban oral tablets What is this medicine? APIXABAN (a PIX a ban) is an anticoagulant (blood thinner). It is used to lower the chance of stroke in people with a medical condition called atrial fibrillation. It is also used to treat or prevent blood clots in the lungs or in the veins. This medicine may be used for other purposes; ask your health care provider or pharmacist if you have questions. COMMON BRAND NAME(S): Eliquis What should I tell my health care provider before I take this medicine? They need to know if you have any of these conditions: -bleeding disorders -bleeding in the brain -blood in your stools (black  or tarry stools) or if you have blood in your vomit -history of stomach bleeding -kidney disease -liver disease -mechanical heart valve -an unusual or allergic reaction to apixaban, other medicines, foods, dyes, or preservatives -pregnant or trying to get pregnant -breast-feeding How should I use this medicine? Take this medicine by mouth with a glass of water. Follow the directions on the prescription label. You can take it with or without food. If it upsets your stomach, take it with food. Take your medicine at regular intervals. Do not take it more often than directed. Do not stop taking except on your doctor's advice. Stopping this medicine may increase your risk of a blot clot. Be sure to refill your prescription before you run out of medicine. Talk to your pediatrician regarding the use of this medicine in children. Special care may be needed. Overdosage: If you think you have taken too much of this medicine contact a poison control center or emergency room at once. NOTE: This medicine is only for you. Do not share this medicine with others. What if I miss a dose? If you miss a dose, take it as soon as you can. If it is almost time for your next dose, take only that dose. Do not take double or extra doses. What may interact with this medicine? This medicine may interact with the following: -aspirin and aspirin-like medicines -certain medicines for fungal infections like ketoconazole and itraconazole -certain medicines for seizures like carbamazepine and phenytoin -certain medicines  that treat or prevent blood clots like warfarin, enoxaparin, and dalteparin -clarithromycin -NSAIDs, medicines for pain and inflammation, like ibuprofen or naproxen -rifampin -ritonavir -St. John's wort This list may not describe all possible interactions. Give your health care provider a list of all the medicines, herbs, non-prescription drugs, or dietary supplements you use. Also tell them if you smoke,  drink alcohol, or use illegal drugs. Some items may interact with your medicine. What should I watch for while using this medicine? Notify your doctor or health care professional and seek emergency treatment if you develop breathing problems; changes in vision; chest pain; severe, sudden headache; pain, swelling, warmth in the leg; trouble speaking; sudden numbness or weakness of the face, arm, or leg. These can be signs that your condition has gotten worse. If you are going to have surgery, tell your doctor or health care professional that you are taking this medicine. Tell your health care professional that you use this medicine before you have a spinal or epidural procedure. Sometimes people who take this medicine have bleeding problems around the spine when they have a spinal or epidural procedure. This bleeding is very rare. If you have a spinal or epidural procedure while on this medicine, call your health care professional immediately if you have back pain, numbness or tingling (especially in your legs and feet), muscle weakness, paralysis, or loss of bladder or bowel control. Avoid sports and activities that might cause injury while you are using this medicine. Severe falls or injuries can cause unseen bleeding. Be careful when using sharp tools or knives. Consider using an Neurosurgeonelectric razor. Take special care brushing or flossing your teeth. Report any injuries, bruising, or red spots on the skin to your doctor or health care professional. What side effects may I notice from receiving this medicine? Side effects that you should report to your doctor or health care professional as soon as possible: -allergic reactions like skin rash, itching or hives, swelling of the face, lips, or tongue -signs and symptoms of bleeding such as bloody or black, tarry stools; red or dark-brown urine; spitting up blood or brown material that looks like coffee grounds; red spots on the skin; unusual bruising or bleeding  from the eye, gums, or nose This list may not describe all possible side effects. Call your doctor for medical advice about side effects. You may report side effects to FDA at 1-800-FDA-1088. Where should I keep my medicine? Keep out of the reach of children. Store at room temperature between 20 and 25 degrees C (68 and 77 degrees F). Throw away any unused medicine after the expiration date. NOTE: This sheet is a summary. It may not cover all possible information. If you have questions about this medicine, talk to your doctor, pharmacist, or health care provider.  2015, [employer]. (2013-04-03 11:59:24)

## 2014-05-25 NOTE — Assessment & Plan Note (Signed)
Patient would like to switch to Eliquis. Plan -Start Eliquis 10 mg twice daily for the first one week and continue with 5 mg twice daily until discontinued. -Discussed risks and benefits of this medication -Followup in one month

## 2014-05-25 NOTE — Assessment & Plan Note (Signed)
05/18/2014 Office Visit Written 05/18/2014 5:38 PM by Dow Adolphichard Swayzie Choate, MD  When he follows up with Dr. Eulah PontMurphy of orthopedics. Apparently, he is being considered for hip replacement, but this will have to be discussed further in the setting of his ongoing anticoagulation. Since his PE and DVT were presumed to be from immobilization from left hip pain, it will imperative to have this addressed at the earliest opportunity.  Plan.  -Will obtain records from Dr. Greig RightMurphy's office.  - He has followup in December 2015.

## 2014-05-26 NOTE — Progress Notes (Signed)
INTERNAL MEDICINE TEACHING ATTENDING ADDENDUM - Josselyn Harkins, MD: I reviewed and discussed at the time of visit with the resident Dr. Kazibwe, the patient's medical history, physical examination, diagnosis and results of pertinent tests and treatment and I agree with the patient's care as documented.  

## 2014-06-18 ENCOUNTER — Other Ambulatory Visit: Payer: Self-pay | Admitting: *Deleted

## 2014-06-18 DIAGNOSIS — I82402 Acute embolism and thrombosis of unspecified deep veins of left lower extremity: Secondary | ICD-10-CM

## 2014-06-18 MED ORDER — HYDROCODONE-ACETAMINOPHEN 5-325 MG PO TABS: 1.0000 | ORAL_TABLET | Freq: Four times a day (QID) | ORAL | Status: DC | PRN

## 2014-06-21 NOTE — Telephone Encounter (Signed)
Pt aware.

## 2014-06-28 ENCOUNTER — Encounter: Payer: Managed Care, Other (non HMO) | Admitting: Internal Medicine

## 2014-07-07 ENCOUNTER — Telehealth: Payer: Self-pay | Admitting: Endocrinology

## 2014-07-07 ENCOUNTER — Other Ambulatory Visit: Payer: Self-pay | Admitting: Physician Assistant

## 2014-07-07 NOTE — Telephone Encounter (Signed)
faxed

## 2014-07-07 NOTE — Telephone Encounter (Signed)
Patient stated that he is having a Hip replacement, Dr Eulah PontMurphy said he need a release form faxed over to them in order to have the surgery. Patient said it , please let him know if it was faxed.

## 2014-07-07 NOTE — H&P (Signed)
TOTAL HIP ADMISSION H&P  Patient is admitted for left total hip arthroplasty.  Subjective:  Chief Complaint: left hip pain  HPI: Gregory May, 53 y.o. male, has a history of pain and functional disability in the left hip(s) due to arthritis and patient has failed non-surgical conservative treatments for greater than 12 weeks to include corticosteriod injections, use of assistive devices and activity modification.  Onset of symptoms was abrupt starting 1 years ago with rapidlly worsening course since that time.The patient noted prior procedures of the hip to include hip pinning on the left hip(s).  Patient currently rates pain in the left hip at 10 out of 10 with activity. Patient has night pain, worsening of pain with activity and weight bearing, trendelenberg gait, pain that interfers with activities of daily living and pain with passive range of motion. Patient has evidence of subchondral sclerosis, periarticular osteophytes and joint space narrowing by imaging studies. This condition presents safety issues increasing the risk of falls.  There is no current active infection.  Patient Active Problem List   Diagnosis Date Noted  . Osteoarthritis, hip, bilateral 05/25/2014  . Pulmonary emboli 02/08/2014  . DVT (deep venous thrombosis) 02/08/2014  . Onychomycosis 10/14/2013  . Pure hypercholesterolemia 09/25/2013  . Diabetes mellitus type 2, controlled, with complications 09/25/2013  . Diabetic foot ulcer 09/15/2013   Past Medical History  Diagnosis Date  . Sebaceous cyst     on back of neck  . Diabetes mellitus     type II    Past Surgical History  Procedure Laterality Date  . Rotator cuff repair  2005 (approx)    right   . Closed reduction with humer pin insertion  1974    left hip  . Joint replacement  2006 (approx)    right hip replaced     (Not in a hospital admission) No Known Allergies  History  Substance Use Topics  . Smoking status: Never Smoker   . Smokeless  tobacco: Never Used  . Alcohol Use: Yes     Comment: occasional    Family History  Problem Relation Age of Onset  . Cancer Mother     breast, colon, liver  . Diabetes Father      Review of Systems  Constitutional: Negative.   HENT: Negative.   Eyes: Negative.   Respiratory: Negative.   Cardiovascular: Negative.   Genitourinary: Negative.   Musculoskeletal: Positive for joint pain.  Skin: Negative.   Neurological: Negative.   Endo/Heme/Allergies: Negative.   Psychiatric/Behavioral: Negative.     Objective:  Physical Exam  Constitutional: He is oriented to person, place, and time. He appears well-developed and well-nourished.  HENT:  Head: Normocephalic and atraumatic.  Eyes: EOM are normal. Pupils are equal, round, and reactive to light.  Neck: Normal range of motion. Neck supple.  Cardiovascular: Normal rate and regular rhythm.  Exam reveals no gallop and no friction rub.   No murmur heard. Respiratory: Effort normal and breath sounds normal. No respiratory distress. He has no wheezes. He has no rales.  GI: Soft. Bowel sounds are normal.  Musculoskeletal:  Left hip has a long lateral incision.  Virtually no rotation, any rotation is very painful.  He is neurovascularly intact distally with the exception of his diabetic peripheral neuropathy.    Neurological: He is alert and oriented to person, place, and time.  Skin: Skin is warm and dry.  Psychiatric: He has a normal mood and affect. His behavior is normal. Judgment and thought content   normal.    Vital signs in last 24 hours: @VSRANGES @  Labs:   Estimated body mass index is 25.27 kg/(m^2) as calculated from the following:   Height as of 05/25/14: 6' (1.829 m).   Weight as of 05/25/14: 84.55 kg (186 lb 6.4 oz).   Imaging Review Plain radiographs demonstrate severe degenerative joint disease of the left hip(s). The bone quality appears to be fair for age and reported activity level.  Assessment/Plan:  End  stage arthritis, left hip(s)  The patient history, physical examination, clinical judgement of the provider and imaging studies are consistent with end stage degenerative joint disease of the left hip(s) and total hip arthroplasty is deemed medically necessary. The treatment options including medical management, injection therapy, arthroscopy and arthroplasty were discussed at length. The risks and benefits of total hip arthroplasty were presented and reviewed. The risks due to aseptic loosening, infection, stiffness, dislocation/subluxation,  thromboembolic complications and other imponderables were discussed.  The patient acknowledged the explanation, agreed to proceed with the plan and consent was signed. Patient is being admitted for inpatient treatment for surgery, pain control, PT, OT, prophylactic antibiotics, VTE prophylaxis, progressive ambulation and ADL's and discharge planning.The patient is planning to be discharged to skilled nursing facility

## 2014-07-07 NOTE — Telephone Encounter (Signed)
Ready

## 2014-07-07 NOTE — Telephone Encounter (Signed)
Please see below, form is on your desk 

## 2014-07-12 ENCOUNTER — Telehealth: Payer: Self-pay | Admitting: *Deleted

## 2014-07-12 NOTE — Telephone Encounter (Signed)
Pt called stating he is having surgery and wants to know if he needs to stop Eliquis 5 mg bid. He is scheduled for Total Hip Arthroplasty on 12/9  I talked with Dr Alexandria LodgeGroce and he stated pt will need to stop Eliquis 48 hours prior to surgery. Also pt needs medical clearance and Chilon will schedule that appointment. Form for stopping eliquis is here, in Dr Huntley DecKazibwe's box.

## 2014-07-13 ENCOUNTER — Encounter: Payer: Self-pay | Admitting: Internal Medicine

## 2014-07-13 ENCOUNTER — Encounter (HOSPITAL_COMMUNITY)
Admission: RE | Admit: 2014-07-13 | Discharge: 2014-07-13 | Disposition: A | Discharge status: Home / Self Care | Payer: Managed Care, Other (non HMO) | Source: Ambulatory Visit | Attending: Orthopedic Surgery | Admitting: Orthopedic Surgery

## 2014-07-13 ENCOUNTER — Encounter (HOSPITAL_COMMUNITY): Payer: Self-pay

## 2014-07-13 ENCOUNTER — Ambulatory Visit (INDEPENDENT_AMBULATORY_CARE_PROVIDER_SITE_OTHER): Payer: Managed Care, Other (non HMO) | Admitting: Internal Medicine

## 2014-07-13 ENCOUNTER — Other Ambulatory Visit (INDEPENDENT_AMBULATORY_CARE_PROVIDER_SITE_OTHER): Payer: Managed Care, Other (non HMO)

## 2014-07-13 VITALS — BP 131/81 | HR 64 | Temp 97.9°F | Ht 72.0 in

## 2014-07-13 DIAGNOSIS — Z86718 Personal history of other venous thrombosis and embolism: Secondary | ICD-10-CM

## 2014-07-13 DIAGNOSIS — I82402 Acute embolism and thrombosis of unspecified deep veins of left lower extremity: Secondary | ICD-10-CM

## 2014-07-13 DIAGNOSIS — E7800 Pure hypercholesterolemia, unspecified: Secondary | ICD-10-CM

## 2014-07-13 DIAGNOSIS — E118 Type 2 diabetes mellitus with unspecified complications: Secondary | ICD-10-CM

## 2014-07-13 DIAGNOSIS — E1165 Type 2 diabetes mellitus with hyperglycemia: Secondary | ICD-10-CM

## 2014-07-13 DIAGNOSIS — IMO0002 Reserved for concepts with insufficient information to code with codable children: Secondary | ICD-10-CM

## 2014-07-13 DIAGNOSIS — M162 Bilateral osteoarthritis resulting from hip dysplasia: Secondary | ICD-10-CM

## 2014-07-13 DIAGNOSIS — Z7901 Long term (current) use of anticoagulants: Secondary | ICD-10-CM

## 2014-07-13 DIAGNOSIS — M160 Bilateral primary osteoarthritis of hip: Secondary | ICD-10-CM

## 2014-07-13 HISTORY — DX: Other pulmonary embolism without acute cor pulmonale: I26.99

## 2014-07-13 HISTORY — DX: Unspecified osteoarthritis, unspecified site: M19.90

## 2014-07-13 HISTORY — DX: Acute embolism and thrombosis of unspecified deep veins of unspecified lower extremity: I82.409

## 2014-07-13 HISTORY — DX: Type 2 diabetes mellitus with foot ulcer: L97.409

## 2014-07-13 HISTORY — DX: Type 2 diabetes mellitus with foot ulcer: E11.621

## 2014-07-13 LAB — URINALYSIS, ROUTINE W REFLEX MICROSCOPIC
Bilirubin Urine: NEGATIVE
Glucose, UA: 250 mg/dL — AB
Hgb urine dipstick: NEGATIVE
Ketones, ur: NEGATIVE mg/dL
Leukocytes, UA: NEGATIVE
Nitrite: NEGATIVE
Protein, ur: NEGATIVE mg/dL
Specific Gravity, Urine: 1.016 (ref 1.005–1.030)
Urobilinogen, UA: 0.2 mg/dL (ref 0.0–1.0)
pH: 5.5 (ref 5.0–8.0)

## 2014-07-13 LAB — CBC WITH DIFFERENTIAL/PLATELET
Basophils Absolute: 0 10*3/uL (ref 0.0–0.1)
Basophils Relative: 0 % (ref 0–1)
Eosinophils Absolute: 0.1 10*3/uL (ref 0.0–0.7)
Eosinophils Relative: 2 % (ref 0–5)
HCT: 44.3 % (ref 39.0–52.0)
Hemoglobin: 14.5 g/dL (ref 13.0–17.0)
Lymphocytes Relative: 25 % (ref 12–46)
Lymphs Abs: 1.8 10*3/uL (ref 0.7–4.0)
MCH: 31.3 pg (ref 26.0–34.0)
MCHC: 32.7 g/dL (ref 30.0–36.0)
MCV: 95.5 fL (ref 78.0–100.0)
Monocytes Absolute: 0.6 10*3/uL (ref 0.1–1.0)
Monocytes Relative: 8 % (ref 3–12)
Neutro Abs: 4.5 10*3/uL (ref 1.7–7.7)
Neutrophils Relative %: 65 % (ref 43–77)
Platelets: 294 10*3/uL (ref 150–400)
RBC: 4.64 MIL/uL (ref 4.22–5.81)
RDW: 13.7 % (ref 11.5–15.5)
WBC: 7 10*3/uL (ref 4.0–10.5)

## 2014-07-13 LAB — SURGICAL PCR SCREEN
MRSA, PCR: NEGATIVE
Staphylococcus aureus: NEGATIVE

## 2014-07-13 LAB — ABO/RH: ABO/RH(D): A POS

## 2014-07-13 LAB — TYPE AND SCREEN
ABO/RH(D): A POS
Antibody Screen: NEGATIVE

## 2014-07-13 LAB — HEMOGLOBIN A1C: Hgb A1c MFr Bld: 7.3 % — ABNORMAL HIGH (ref 4.6–6.5)

## 2014-07-13 LAB — APTT: aPTT: 28 seconds (ref 24–37)

## 2014-07-13 LAB — PROTIME-INR
INR: 1.12 (ref 0.00–1.49)
Prothrombin Time: 14.6 seconds (ref 11.6–15.2)

## 2014-07-13 MED ORDER — HYDROCODONE-ACETAMINOPHEN 5-325 MG PO TABS: 1.0000 | ORAL_TABLET | Freq: Four times a day (QID) | ORAL | Status: DC | PRN

## 2014-07-13 NOTE — Progress Notes (Signed)
Patient returned to PAT with urine specimen and patient informed Nurse that he was instructed to stop taking Eliquis two days before surgery.

## 2014-07-13 NOTE — Progress Notes (Signed)
Subjective:     Patient ID: Gregory May, male   DOB: June 25, 1961, 53 y.o.   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 Please see problem oriented charting for more information.  Review of Systems  General: Denies fever, chills, fatigue, change in appetite and diaphoresis.  Respiratory: Denies SOB, cough, DOE, chest tightness, and wheezing.   Cardiovascular: Denies chest pain and palpitations.  Gastrointestinal: Denies nausea, vomiting, abdominal pain, diarrhea, constipation, blood in stool and abdominal distention.  Genitourinary: Denies dysuria, urgency, frequency, hematuria, suprapubic pain and flank pain. Endocrine: Denies hot or cold intolerance, polyuria, and polydipsia. Musculoskeletal: Admits to left hip pain. Denies myalgias, back pain, joint swelling..  Skin: Denies pallor, rash and wounds.  Neurological: Denies dizziness, headaches, weakness, lightheadedness, numbness,seizures, and syncope, Psychiatric/Behavioral: Denies mood changes, confusion, nervousness, sleep disturbance and agitation.     Objective:   Physical Exam Filed Vitals:   07/13/14 1410  BP: 131/81  Pulse: 64  Temp: 97.9 F (36.6 C)  TempSrc: Oral  Height: 6' (1.829 m)  SpO2: 98%   General: Vital signs reviewed.  Patient is well-developed and well-nourished, in no acute distress and cooperative with exam.  Cardiovascular: RRR, S1 normal, S2 normal, no murmurs, gallops, or rubs. Pulmonary/Chest: Clear to auscultation bilaterally, no wheezes, rales, or rhonchi. Abdominal: Soft, non-tender, non-distended, BS +, no masses, organomegaly, or guarding present.  Extremities: 1+ pitting edema in right lower extremity and 1-2+ pitting edema in left lower extremity, no calf tenderness, no erythema or increased warmth,  pulses  symmetric and intact bilaterally.  Skin: Warm, dry and intact. No rashes or erythema. Psychiatric: Normal mood and affect. speech and behavior is normal. Cognition and memory are normal.      Assessment:         Plan:     Please see problem based assessment and plan.

## 2014-07-13 NOTE — Assessment & Plan Note (Addendum)
Assessment: Patient has done well on Eliquis 5 mg BID for DVT/PE. He denies any chest pain, shortness of breath, or calf tenderness. Patient does have 1-2+ pitting edema in her lower extremities bilaterally without calf tenderness, erythema or increased warmth. Patient will stop his Eliquis for his left total hip arthroplasty on 07/21/14. Patient is a high thrombotic risk and low bleeding risk with this surgery.   Plan: -Stop Eliquis 48 hours prior to surgery  -Restart Eliquis 24 hours after surgery -Signs of symptoms of DVT/PE were discussed with the patient and he acknowledged understanding

## 2014-07-13 NOTE — Assessment & Plan Note (Addendum)
Assessment: Patient has history of OA of hips, slipped capital femoral epiphysis, right sided ORIF who has had increasing left hip pain with difficulty with ambulation. Dr. Mckinley Jewelaniel Murphy plans for total hip arthroplasty of left hip on 07/21/14. Patient presented for medical clearance.   Plan:  -It is my medical opinion that the patient is medically cleared for surgery  -Stop Eliquis 48 hours prior to surgery  -Restart Eliquis 24 hours after surgery

## 2014-07-13 NOTE — Progress Notes (Signed)
Patient arrived to PAT on crutches at 1235 and informed Nurse that he had just left his physicians office and he had lab work done there. Will request CMET from Dr. Remus BlakeKumar's office, however other lab work will be obtained as Dr. Eulah PontMurphy has requested. Nurse asked patient about when he was to stop Eliquis and patient stated that he had a appointment downstairs in the clinic and he would come back and inform Nurse when he was instructed to stop taking Eliquis. Chrissy (Designer, industrial/productlab tech) informed Nurse that patient did not provide a urine specimen. Will check into this.   Patient denied having any chest pain or discomfort. Patient requested to have assistance with a wheelchair to go to the clinic. Sherie Donarra, NT provided a wheelchair for patient and Marylene Landngela ([employer] CorporationLab Tech) took patient to appointment.

## 2014-07-13 NOTE — Patient Instructions (Signed)
General Instructions:   Please bring your medicines with you each time you come to clinic.  Medicines may include prescription medications, over-the-counter medications, herbal remedies, eye drops, vitamins, or other pills.   We have cleared you for surgery on 07/21/14. As discussed, your Eliquis should be stopped 48 hours prior to surgery. Based on your schedule, your last dose should be on:  Monday, December 7th at 3:00 am (when you go to bed).  Eliquis can be restarted 24 hours after surgery.  If you have any chest pain or shortness of breath please go to the ED or tell your physician.  Total Hip Replacement Total hip replacement is the replacement of your damaged hip with an artificial hip joint (prosthetic hip joint). The purpose of this surgery is to reduce pain and improve your hip function. LET [facility]OUR [facility]ALTH CARE PROVIDER KNOW ABOUT:   Any allergies you have.  All medicines you are taking, including vitamins, herbs, eye drops, creams, and over-the-counter medicines.  Previous problems you or members of your family have had with the use of anesthetics.  Any blood disorders you have.  Previous surgeries you have had.  Medical conditions you have. RISKS AND COMPLICATIONS Generally, total hip replacement is a safe procedure. However, problems can occur and include:  Infection.  Dislocation (the ball of the hip-joint prosthesis comes out of contact with the socket).  Loosening of the stem connected to the ball or socket.  Fracture of the bone while inserting the prosthesis.  Formation of blood clots, which can break loose and travel to and injure your lungs (pulmonary embolus). BEFORE THE PROCEDURE   Do not eat or drink anything after midnight on the night before the procedure or as directed by your health care provider.  Ask your health care provider about changing or stopping your regular medicines. This is especially important if you are taking diabetes medicines or  blood thinners. PROCEDURE  Just before the procedure, you will receive medicine that makes you drowsy (sedative) or a medicine that makes you fall asleep (general anesthetic). This will be given through a tube that is inserted into one of your veins (IV tube).  You will then receive a medicine injected into your spine that numbs your body below the waist (spinal anesthetic).  An incision is made in your hip. Your surgeon will take out any damaged cartilage and bone.  Next, your surgeon will insert a prosthetic socket into your pelvic bone. This is usually secured with screws.  Your surgeon will then cut off the ball of your thigh bone (femur) and attach a prosthetic ball on a stem to your femur.  The surgeon will place the ball into the socket and check the range of motion of your new hip. AFTER THE PROCEDURE   You will be taken to a recovery area where a nurse will watch and check your progress.  Once you are awake and stable, you will be taken to a hospital room.  You will receive physical therapy until you are doing well and your health care provider feels it is safe for you to go home. Document Released: 11/05/2000 Document Revised: 12/14/2013 Document Reviewed: 09/30/2013 [facility] Patient Information 2015 [employer] [place]. This information is not intended to replace advice given to you by your health care provider. Make sure you discuss any questions you have with your health care provider.

## 2014-07-13 NOTE — Pre-Procedure Instructions (Signed)
Gregory May  07/13/2014   Your procedure is scheduled on:  Wednesday July 21, 2014 at 8:30 AM.  [facility].  Call this number if you have problems the morning of surgery: [phone]    Remember:   Do not eat food or drink liquids after midnight.   Take these medicines the morning of surgery with A SIP OF WATER: Hydrocodone if needed   Do NOT take any diabetic medications the morning of your surgery   Check with your Physician about when to stop Eliquis   Do not wear jewelry.  Do not wear lotions, powders, or colgone.   Men may shave face and neck.  Do not bring valuables to the hospital.  [facility] not responsible for any belongings or valuables.               Contacts, dentures or bridgework may not be worn into surgery.  Leave suitcase in the car. After surgery it may be brought to your room.  For patients admitted to the hospital, discharge time is determined by your treatment team.               Patients discharged the day of surgery will not be allowed to drive home.  Name and phone number of your driver:   Special Instructions: Shower using CHG soap the night before and the morning of your surgery   Please read over the following fact sheets that you were given: Pain Booklet, Coughing and Deep Breathing, Blood Transfusion Information, Total Joint Packet, MRSA Information and Surgical Site Infection Prevention

## 2014-07-14 LAB — URINE CULTURE
Colony Count: NO GROWTH
Culture: NO GROWTH

## 2014-07-14 LAB — COMPREHENSIVE METABOLIC PANEL
ALT: 54 U/L — ABNORMAL HIGH (ref 0–53)
AST: 42 U/L — ABNORMAL HIGH (ref 0–37)
Albumin: 4.2 g/dL (ref 3.5–5.2)
Alkaline Phosphatase: 62 U/L (ref 39–117)
BUN: 13 mg/dL (ref 6–23)
CO2: 25 mEq/L (ref 19–32)
Calcium: 9.1 mg/dL (ref 8.4–10.5)
Chloride: 101 mEq/L (ref 96–112)
Creatinine, Ser: 0.9 mg/dL (ref 0.4–1.5)
GFR: 90.77 mL/min (ref >60.00)
Glucose, Bld: 162 mg/dL — ABNORMAL HIGH (ref 70–99)
Potassium: 4.2 mEq/L (ref 3.5–5.1)
Sodium: 136 mEq/L (ref 135–145)
Total Bilirubin: 0.5 mg/dL (ref 0.2–1.2)
Total Protein: 7.5 g/dL (ref 6.0–8.3)

## 2014-07-14 LAB — LIPID PANEL
Cholesterol: 164 mg/dL (ref 0–200)
HDL: 57.8 mg/dL (ref >39.00)
LDL Cholesterol: 95 mg/dL (ref 0–99)
NonHDL: 106.2
Total CHOL/HDL Ratio: 3
Triglycerides: 57 mg/dL (ref 0.0–149.0)
VLDL: 11.4 mg/dL (ref 0.0–40.0)

## 2014-07-14 NOTE — Progress Notes (Signed)
INTERNAL MEDICINE TEACHING ATTENDING ADDENDUM - Earl LagosNischal Teneil Shiller, MD: I personally saw and evaluated Mr. Hutchison in this clinic visit in conjunction with the resident, Dr. Senaida Oresichardson. I have discussed patient's plan of care with medical resident during this visit. I have confirmed the physical exam findings and have read and agree with the clinic note including the plan with the following addition: - Pt with no medical contraindication to surgery at this time - I agree with Dr. Berenda Moraleichardson's recommendations about Eliquis - Education provided to patient regarding signs and symptoms of DVT/PE

## 2014-07-14 NOTE — Telephone Encounter (Signed)
Pt seen in clinic and meds addressed for medical clearance.

## 2014-07-19 ENCOUNTER — Encounter: Payer: Self-pay | Admitting: Endocrinology

## 2014-07-19 ENCOUNTER — Ambulatory Visit: Payer: Managed Care, Other (non HMO) | Admitting: Endocrinology

## 2014-07-19 ENCOUNTER — Ambulatory Visit (INDEPENDENT_AMBULATORY_CARE_PROVIDER_SITE_OTHER): Payer: Managed Care, Other (non HMO) | Admitting: Endocrinology

## 2014-07-19 VITALS — BP 124/78 | HR 72 | Temp 98.4°F | Resp 14 | Ht 72.0 in | Wt 199.6 lb

## 2014-07-19 DIAGNOSIS — E1165 Type 2 diabetes mellitus with hyperglycemia: Secondary | ICD-10-CM

## 2014-07-19 DIAGNOSIS — IMO0002 Reserved for concepts with insufficient information to code with codable children: Secondary | ICD-10-CM

## 2014-07-19 NOTE — Patient Instructions (Signed)
Please check blood sugars at least half the time about 2 hours after any meal and 2-3 times per week on waking up. Please bring blood sugar monitor to each visit  TAKE LANTUS daily

## 2014-07-19 NOTE — Progress Notes (Signed)
Gregory May 53 y.o.    Reason for Appointment: Diabetes follow-up   History of Present Illness   Diagnosis: Type 2 DIABETES MELITUS, date of diagnosis:  1999     Previous history: He has previously been treated with metformin and Victoza and subsequently mealtime insulin added to control postprandial hyperglycemia Overall he has been  relatively noncompliant with his diet, medications, monitoring and followup Previously would not take Victoza regularly because of cost. Has not been taking any medications for the last year and a half because of commitments to family and cost A1c had increased to 12.3% and hyperglycemia discovered when he was hospitalized for foot ulcer. Also lost 20 pounds because of hyperglycemia   Recent history:  His blood sugars appear to be higher than his last visit He is not checking his blood sugars  consistently and mostly before breakfast and bedtime Previously glucose levels were well controlled  since about 10/2013 He has gained weight and overall is eating larger portions at meals, still very inactive A1c has increased by 1% For some reason he is taking his Lantus every other day, probably because of cost However his fasting blood sugars fairly consistent and averaging about 130 Mostly checking blood sugars at bedtime before his snack but not clear if he is has higher readings postprandially     Insulin regimen: Lantus: 15 units at bedtime qod   Novolog 7 units at breakfast, ---10/12 units suppertime     Proper timing of injections in relation to meals:  usually   Oral hypoglycemic drugs: None        Side effects from medications: None       Monitors blood glucose: Marland Kitchen.    Glucometer: One Touch.          Blood Glucose readings   PRE-MEAL Breakfast Lunch Dinner Bedtime Overall  Glucose range:  114-142   151   101, 116   103-174    Median:     133    Hypoglycemia: None    Meals:  usually 2 meals per dayusually .  breakfast at 12 noon, evening  meal 7 pm         Physical activity: exercise:None y            Weight control:   Wt Readings from Last 3 Encounters:  07/19/14 199 lb 9.6 oz (90.538 kg)  07/13/14 195 lb 3 oz (88.536 kg)  05/25/14 186 lb 6.4 oz (84.55 kg)          Complications: Diabetic foot ulcer, Now healed  Diabetes labs:  Lab Results  Component Value Date   HGBA1C 7.3* 07/13/2014   HGBA1C 6.3 04/23/2014   HGBA1C 6.5 01/20/2014   Lab Results  Component Value Date   MICROALBUR 0.8 04/23/2014   LDLCALC 95 07/13/2014   CREATININE 0.9 07/13/2014       Medication List       This list is accurate as of: 07/19/14  4:17 PM.  Always use your most recent med list.               apixaban 5 MG Tabs tablet  Commonly known as:  ELIQUIS  10 mg twice daily for 7 days followed by 5 mg twice daily     bag balm Oint ointment  Apply 1 application topically as needed for dry skin.     enoxaparin 120 MG/0.8ML injection  Commonly known as:  LOVENOX     gabapentin 100 MG capsule  Commonly known  as:  NEURONTIN  Take 3 capsules (300 mg total) by mouth 3 (three) times daily.     HYDROcodone-acetaminophen 5-325 MG per tablet  Commonly known as:  NORCO/VICODIN  Take 1 tablet by mouth every 6 (six) hours as needed for moderate pain.     insulin aspart 100 UNIT/ML FlexPen  Commonly known as:  NOVOLOG FLEXPEN  Inject 7 units at breakfast and lunch and 10 units at dinner     insulin glargine 100 UNIT/ML injection  Commonly known as:  LANTUS  Inject 15 Units into the skin every other day.     mupirocin ointment 2 %  Commonly known as:  BACTROBAN  Apply 1 application topically daily. Applied to affected heal     pravastatin 40 MG tablet  Commonly known as:  PRAVACHOL  Take 1 tablet (40 mg total) by mouth daily.     RELION INSULIN SYR 0.3ML/31G 31G X 5/16" 0.3 ML Misc  Generic drug:  Insulin Syringe-Needle U-100     Vitamin D (Ergocalciferol) 50000 UNITS Caps capsule  Commonly known as:  DRISDOL  Take 1  capsule (50,000 Units total) by mouth every 7 (seven) days.     VITAMIN D PO  Take 1 tablet by mouth daily. 1500mg      cholecalciferol 1000 UNITS tablet  Commonly known as:  VITAMIN D  Take 1,000 Units by mouth daily.        Allergies: No Known Allergies  Past Medical History  Diagnosis Date  . Sebaceous cyst     on back of neck  . Diabetes mellitus     type II  . Pulmonary embolism   . Deep vein thrombosis (DVT)   . Arthritis     bilateral hips  . Diabetic ulcer of heel     Right heel    Past Surgical History  Procedure Laterality Date  . Rotator cuff repair  2005 (approx)    right   . Closed reduction with humer pin insertion  1974    left hip  . Joint replacement  2006 (approx)    right hip replaced    Family History  Problem Relation Age of Onset  . Cancer Mother     breast, colon, liver  . Diabetes Father     Social History:  reports that he has never smoked. He has never used smokeless tobacco. He reports that he drinks alcohol. He reports that he does not use illicit drugs.  Review of Systems:  Hypertension:  not present   Lipids: He is on Pravachol with good control of LDLas of 3/15    Lab Results  Component Value Date   CHOL 164 07/13/2014   HDL 57.80 07/13/2014   LDLCALC 95 07/13/2014   TRIG 57.0 07/13/2014   CHOLHDL 3 07/13/2014    Continues to be followed by his orthopedic surgeon for foot ulcer which  has now healed   He is now having significant hip pain and his hip replacement has finally been scheduled He  is going to get  hip joint surgery this week   Examination:   BP 124/78 mmHg  Pulse 72  Temp(Src) 98.4 F (36.9 C)  Resp 14  Ht 6' (1.829 m)  Wt 199 lb 9.6 oz (90.538 kg)  BMI 27.06 kg/m2  SpO2 95%  Body mass index is 27.06 kg/(m^2).      ASSESSMENT/ PLAN:    Diabetes type 2:  Blood glucose control has been  not as consistent because of her diet  and inactivity Also for conserving his insulin he is taking his Lantus  only every other day Fasting blood sugars are modestly high and surprisingly not fluctuating even with not taking Lantus daily Most of his high readings are after supper when he is not watching his diet but usually not checking readings 2 hours after eating   Recommendations made today:  He will need to take his 15  Lantus every day consistently  Adjust his NovoLog based on 2 hour postprandial reading  Check blood sugars more consistently and at different times but do it at suppertime for better compliance  Watch portions better to avoid further weight gain  He will call if he has any difficulties with his glucose control after his hip surgery  Hyperlipidemia: Well controlled as of 3/15And will recheck on the next visit     Aliah Eriksson 07/19/2014, 4:17 PM

## 2014-07-20 MED ORDER — LACTATED RINGERS IV SOLN: INTRAVENOUS | Status: DC

## 2014-07-20 MED ORDER — CHLORHEXIDINE GLUCONATE 4 % EX LIQD
60.0000 mL | Freq: Once | CUTANEOUS | Status: DC
  Filled 2014-07-20: qty 60

## 2014-07-20 MED ORDER — CEFAZOLIN SODIUM-DEXTROSE 2-3 GM-% IV SOLR
2.0000 g | INTRAVENOUS | Status: DC
  Filled 2014-07-20: qty 50

## 2014-07-20 NOTE — Interval H&P Note (Signed)
History and Physical Interval Note:  07/20/2014 5:43 PM  Gregory May  has presented today for surgery, with the diagnosis of DJD HIP, HARDWARE COMPLICATONS  The various methods of treatment have been discussed with the patient and family. After consideration of risks, benefits and other options for treatment, the patient has consented to  Procedure(s): TOTAL HIP ARTHROPLASTY ANTERIOR APPROACH (Left) HARDWARE REMOVAL (Left) as a surgical intervention .  The patient's history has been reviewed, patient examined, no change in status, stable for surgery.  I have reviewed the patient's chart and labs.  Questions were answered to the patient's satisfaction.     MURPHY,DANIEL F

## 2014-07-20 NOTE — H&P (View-Only) (Signed)
TOTAL HIP ADMISSION H&P  Patient is admitted for left total hip arthroplasty.  Subjective:  Chief Complaint: left hip pain  HPI: Gregory May, 53 y.o. male, has a history of pain and functional disability in the left hip(s) due to arthritis and patient has failed non-surgical conservative treatments for greater than 12 weeks to include corticosteriod injections, use of assistive devices and activity modification.  Onset of symptoms was abrupt starting 1 years ago with rapidlly worsening course since that time.The patient noted prior procedures of the hip to include hip pinning on the left hip(s).  Patient currently rates pain in the left hip at 10 out of 10 with activity. Patient has night pain, worsening of pain with activity and weight bearing, trendelenberg gait, pain that interfers with activities of daily living and pain with passive range of motion. Patient has evidence of subchondral sclerosis, periarticular osteophytes and joint space narrowing by imaging studies. This condition presents safety issues increasing the risk of falls.  There is no current active infection.  Patient Active Problem List   Diagnosis Date Noted  . Osteoarthritis, hip, bilateral 05/25/2014  . Pulmonary emboli 02/08/2014  . DVT (deep venous thrombosis) 02/08/2014  . Onychomycosis 10/14/2013  . Pure hypercholesterolemia 09/25/2013  . Diabetes mellitus type 2, controlled, with complications 09/25/2013  . Diabetic foot ulcer 09/15/2013   Past Medical History  Diagnosis Date  . Sebaceous cyst     on back of neck  . Diabetes mellitus     type II    Past Surgical History  Procedure Laterality Date  . Rotator cuff repair  2005 (approx)    right   . Closed reduction with humer pin insertion  1974    left hip  . Joint replacement  2006 (approx)    right hip replaced     (Not in a hospital admission) No Known Allergies  History  Substance Use Topics  . Smoking status: Never Smoker   . Smokeless  tobacco: Never Used  . Alcohol Use: Yes     Comment: occasional    Family History  Problem Relation Age of Onset  . Cancer Mother     breast, colon, liver  . Diabetes Father      Review of Systems  Constitutional: Negative.   HENT: Negative.   Eyes: Negative.   Respiratory: Negative.   Cardiovascular: Negative.   Genitourinary: Negative.   Musculoskeletal: Positive for joint pain.  Skin: Negative.   Neurological: Negative.   Endo/Heme/Allergies: Negative.   Psychiatric/Behavioral: Negative.     Objective:  Physical Exam  Constitutional: He is oriented to person, place, and time. He appears well-developed and well-nourished.  HENT:  Head: Normocephalic and atraumatic.  Eyes: EOM are normal. Pupils are equal, round, and reactive to light.  Neck: Normal range of motion. Neck supple.  Cardiovascular: Normal rate and regular rhythm.  Exam reveals no gallop and no friction rub.   No murmur heard. Respiratory: Effort normal and breath sounds normal. No respiratory distress. He has no wheezes. He has no rales.  GI: Soft. Bowel sounds are normal.  Musculoskeletal:  Left hip has a long lateral incision.  Virtually no rotation, any rotation is very painful.  He is neurovascularly intact distally with the exception of his diabetic peripheral neuropathy.    Neurological: He is alert and oriented to person, place, and time.  Skin: Skin is warm and dry.  Psychiatric: He has a normal mood and affect. His behavior is normal. Judgment and thought content  normal.    Vital signs in last 24 hours: @VSRANGES @  Labs:   Estimated body mass index is 25.27 kg/(m^2) as calculated from the following:   Height as of 05/25/14: 6' (1.829 m).   Weight as of 05/25/14: 84.55 kg (186 lb 6.4 oz).   Imaging Review Plain radiographs demonstrate severe degenerative joint disease of the left hip(s). The bone quality appears to be fair for age and reported activity level.  Assessment/Plan:  End  stage arthritis, left hip(s)  The patient history, physical examination, clinical judgement of the provider and imaging studies are consistent with end stage degenerative joint disease of the left hip(s) and total hip arthroplasty is deemed medically necessary. The treatment options including medical management, injection therapy, arthroscopy and arthroplasty were discussed at length. The risks and benefits of total hip arthroplasty were presented and reviewed. The risks due to aseptic loosening, infection, stiffness, dislocation/subluxation,  thromboembolic complications and other imponderables were discussed.  The patient acknowledged the explanation, agreed to proceed with the plan and consent was signed. Patient is being admitted for inpatient treatment for surgery, pain control, PT, OT, prophylactic antibiotics, VTE prophylaxis, progressive ambulation and ADL's and discharge planning.The patient is planning to be discharged to skilled nursing facility

## 2014-07-21 ENCOUNTER — Encounter (HOSPITAL_COMMUNITY)
Admission: RE | Disposition: A | Discharge status: Home Health | Payer: Self-pay | Source: Ambulatory Visit | Attending: Orthopedic Surgery

## 2014-07-21 ENCOUNTER — Inpatient Hospital Stay (HOSPITAL_COMMUNITY)
Admission: RE | Admit: 2014-07-21 | Discharge: 2014-07-24 | DRG: 470 | Disposition: A | Discharge status: Home Health | Payer: Managed Care, Other (non HMO) | Source: Ambulatory Visit | Attending: Orthopedic Surgery | Admitting: Orthopedic Surgery

## 2014-07-21 ENCOUNTER — Ambulatory Visit (HOSPITAL_COMMUNITY): Payer: Managed Care, Other (non HMO)

## 2014-07-21 ENCOUNTER — Encounter (HOSPITAL_COMMUNITY): Payer: Self-pay | Admitting: Certified Registered Nurse Anesthetist

## 2014-07-21 ENCOUNTER — Ambulatory Visit (HOSPITAL_COMMUNITY): Payer: Managed Care, Other (non HMO) | Admitting: Certified Registered Nurse Anesthetist

## 2014-07-21 DIAGNOSIS — M25552 Pain in left hip: Secondary | ICD-10-CM | POA: Diagnosis present

## 2014-07-21 DIAGNOSIS — D62 Acute posthemorrhagic anemia: Secondary | ICD-10-CM | POA: Diagnosis not present

## 2014-07-21 DIAGNOSIS — M1612 Unilateral primary osteoarthritis, left hip: Principal | ICD-10-CM | POA: Diagnosis present

## 2014-07-21 DIAGNOSIS — Z86718 Personal history of other venous thrombosis and embolism: Secondary | ICD-10-CM | POA: Diagnosis not present

## 2014-07-21 DIAGNOSIS — Z96641 Presence of right artificial hip joint: Secondary | ICD-10-CM | POA: Diagnosis present

## 2014-07-21 DIAGNOSIS — E1142 Type 2 diabetes mellitus with diabetic polyneuropathy: Secondary | ICD-10-CM | POA: Diagnosis present

## 2014-07-21 DIAGNOSIS — Z86711 Personal history of pulmonary embolism: Secondary | ICD-10-CM

## 2014-07-21 DIAGNOSIS — Z472 Encounter for removal of internal fixation device: Secondary | ICD-10-CM | POA: Diagnosis present

## 2014-07-21 DIAGNOSIS — M179 Osteoarthritis of knee, unspecified: Secondary | ICD-10-CM | POA: Diagnosis present

## 2014-07-21 DIAGNOSIS — Z833 Family history of diabetes mellitus: Secondary | ICD-10-CM | POA: Diagnosis not present

## 2014-07-21 DIAGNOSIS — E780 Pure hypercholesterolemia: Secondary | ICD-10-CM | POA: Diagnosis present

## 2014-07-21 DIAGNOSIS — Z96642 Presence of left artificial hip joint: Secondary | ICD-10-CM

## 2014-07-21 DIAGNOSIS — M1710 Unilateral primary osteoarthritis, unspecified knee: Secondary | ICD-10-CM | POA: Diagnosis present

## 2014-07-21 HISTORY — PX: TOTAL HIP ARTHROPLASTY: SHX124

## 2014-07-21 HISTORY — PX: HARDWARE REMOVAL: SHX979

## 2014-07-21 HISTORY — DX: Unspecified osteoarthritis, unspecified site: M19.90

## 2014-07-21 LAB — GLUCOSE, CAPILLARY
Glucose-Capillary: 111 mg/dL — ABNORMAL HIGH (ref 70–99)
Glucose-Capillary: 165 mg/dL — ABNORMAL HIGH (ref 70–99)
Glucose-Capillary: 194 mg/dL — ABNORMAL HIGH (ref 70–99)
Glucose-Capillary: 165 mg/dL — ABNORMAL HIGH (ref 70–99)

## 2014-07-21 LAB — HEMOGLOBIN A1C
Hgb A1c MFr Bld: 6.9 % — ABNORMAL HIGH (ref <5.7)
Mean Plasma Glucose: 151 mg/dL — ABNORMAL HIGH (ref <117)

## 2014-07-21 SURGERY — ARTHROPLASTY, HIP, TOTAL, ANTERIOR APPROACH
Anesthesia: General | Laterality: Left

## 2014-07-21 MED ORDER — METHOCARBAMOL 500 MG PO TABS
500.0000 mg | ORAL_TABLET | Freq: Four times a day (QID) | ORAL | Status: DC | PRN
  Administered 2014-07-22 – 2014-07-24 (×8): 500 mg via ORAL
  Filled 2014-07-21 (×8): qty 1

## 2014-07-21 MED ORDER — HYDROMORPHONE HCL 1 MG/ML IJ SOLN
INTRAMUSCULAR | Status: AC
  Filled 2014-07-21: qty 1

## 2014-07-21 MED ORDER — METHOCARBAMOL 1000 MG/10ML IJ SOLN: 500.0000 mg | Freq: Four times a day (QID) | INTRAMUSCULAR | Status: DC | PRN

## 2014-07-21 MED ORDER — MENTHOL 3 MG MT LOZG: 1.0000 | LOZENGE | OROMUCOSAL | Status: DC | PRN

## 2014-07-21 MED ORDER — HYDROMORPHONE HCL 1 MG/ML IJ SOLN
0.5000 mg | INTRAMUSCULAR | Status: DC | PRN
  Administered 2014-07-21: 1 mg via INTRAVENOUS
  Filled 2014-07-21: qty 1

## 2014-07-21 MED ORDER — ONDANSETRON HCL 4 MG/2ML IJ SOLN
INTRAMUSCULAR | Status: AC
  Filled 2014-07-21: qty 4

## 2014-07-21 MED ORDER — ONDANSETRON HCL 4 MG/2ML IJ SOLN: 4.0000 mg | Freq: Four times a day (QID) | INTRAMUSCULAR | Status: DC | PRN

## 2014-07-21 MED ORDER — BISACODYL 5 MG PO TBEC: 5.0000 mg | DELAYED_RELEASE_TABLET | Freq: Every day | ORAL | Status: DC | PRN

## 2014-07-21 MED ORDER — OXYCODONE HCL 5 MG PO TABS
5.0000 mg | ORAL_TABLET | ORAL | Status: DC | PRN
  Administered 2014-07-21 – 2014-07-24 (×15): 10 mg via ORAL
  Filled 2014-07-21 (×16): qty 2

## 2014-07-21 MED ORDER — PHENYLEPHRINE 40 MCG/ML (10ML) SYRINGE FOR IV PUSH (FOR BLOOD PRESSURE SUPPORT)
PREFILLED_SYRINGE | INTRAVENOUS | Status: AC
Start: 2014-07-21 — End: 2014-07-21
  Filled 2014-07-21: qty 10

## 2014-07-21 MED ORDER — FENTANYL CITRATE 0.05 MG/ML IJ SOLN
INTRAMUSCULAR | Status: AC
  Filled 2014-07-21: qty 5

## 2014-07-21 MED ORDER — ONDANSETRON HCL 4 MG PO TABS: 4.0000 mg | ORAL_TABLET | Freq: Four times a day (QID) | ORAL | Status: DC | PRN

## 2014-07-21 MED ORDER — PROPOFOL 10 MG/ML IV BOLUS
INTRAVENOUS | Status: AC
  Filled 2014-07-21: qty 20

## 2014-07-21 MED ORDER — GABAPENTIN 300 MG PO CAPS: 300.0000 mg | ORAL_CAPSULE | Freq: Three times a day (TID) | ORAL | Status: DC

## 2014-07-21 MED ORDER — LIDOCAINE HCL (CARDIAC) 20 MG/ML IV SOLN
INTRAVENOUS | Status: AC
  Filled 2014-07-21: qty 5

## 2014-07-21 MED ORDER — PNEUMOCOCCAL VAC POLYVALENT 25 MCG/0.5ML IJ INJ
0.5000 mL | INJECTION | INTRAMUSCULAR | Status: AC
  Administered 2014-07-22: 0.5 mL via INTRAMUSCULAR
  Filled 2014-07-21: qty 0.5

## 2014-07-21 MED ORDER — GLYCOPYRROLATE 0.2 MG/ML IJ SOLN
INTRAMUSCULAR | Status: DC | PRN
  Administered 2014-07-21: .6 mg via INTRAVENOUS

## 2014-07-21 MED ORDER — APIXABAN 5 MG PO TABS
5.0000 mg | ORAL_TABLET | Freq: Two times a day (BID) | ORAL | Status: DC
  Administered 2014-07-22 – 2014-07-24 (×5): 5 mg via ORAL
  Filled 2014-07-21 (×6): qty 1

## 2014-07-21 MED ORDER — DIPHENHYDRAMINE HCL 12.5 MG/5ML PO ELIX: 12.5000 mg | ORAL_SOLUTION | ORAL | Status: DC | PRN

## 2014-07-21 MED ORDER — ROCURONIUM BROMIDE 50 MG/5ML IV SOLN
INTRAVENOUS | Status: AC
Start: 2014-07-21 — End: 2014-07-21
  Filled 2014-07-21: qty 1

## 2014-07-21 MED ORDER — MIDAZOLAM HCL 2 MG/2ML IJ SOLN
INTRAMUSCULAR | Status: AC
  Filled 2014-07-21: qty 2

## 2014-07-21 MED ORDER — ROCURONIUM BROMIDE 100 MG/10ML IV SOLN
INTRAVENOUS | Status: DC | PRN
  Administered 2014-07-21: 20 mg via INTRAVENOUS
  Administered 2014-07-21: 10 mg via INTRAVENOUS
  Administered 2014-07-21: 50 mg via INTRAVENOUS
  Administered 2014-07-21: 10 mg via INTRAVENOUS

## 2014-07-21 MED ORDER — ACETAMINOPHEN 325 MG PO TABS: 325.0000 mg | ORAL_TABLET | ORAL | Status: DC | PRN

## 2014-07-21 MED ORDER — OXYCODONE HCL 5 MG/5ML PO SOLN: 5.0000 mg | Freq: Once | ORAL | Status: DC | PRN

## 2014-07-21 MED ORDER — GLYCOPYRROLATE 0.2 MG/ML IJ SOLN
INTRAMUSCULAR | Status: AC
  Filled 2014-07-21: qty 3

## 2014-07-21 MED ORDER — CEFAZOLIN SODIUM-DEXTROSE 2-3 GM-% IV SOLR
2.0000 g | Freq: Four times a day (QID) | INTRAVENOUS | Status: AC
  Administered 2014-07-21 (×2): 2 g via INTRAVENOUS
  Filled 2014-07-21 (×2): qty 50

## 2014-07-21 MED ORDER — LIDOCAINE HCL (CARDIAC) 20 MG/ML IV SOLN
INTRAVENOUS | Status: DC | PRN
  Administered 2014-07-21: 60 mg via INTRAVENOUS

## 2014-07-21 MED ORDER — INSULIN ASPART 100 UNIT/ML ~~LOC~~ SOLN
0.0000 [IU] | Freq: Three times a day (TID) | SUBCUTANEOUS | Status: DC
  Administered 2014-07-21 – 2014-07-22 (×3): 2 [IU] via SUBCUTANEOUS
  Administered 2014-07-22: 3 [IU] via SUBCUTANEOUS
  Administered 2014-07-22 – 2014-07-24 (×5): 2 [IU] via SUBCUTANEOUS
  Administered 2014-07-24: 1 [IU] via SUBCUTANEOUS
  Administered 2014-07-24: 2 [IU] via SUBCUTANEOUS

## 2014-07-21 MED ORDER — METOCLOPRAMIDE HCL 5 MG/ML IJ SOLN: 5.0000 mg | Freq: Three times a day (TID) | INTRAMUSCULAR | Status: DC | PRN

## 2014-07-21 MED ORDER — NEOSTIGMINE METHYLSULFATE 10 MG/10ML IV SOLN
INTRAVENOUS | Status: DC | PRN
  Administered 2014-07-21: 4 mg via INTRAVENOUS

## 2014-07-21 MED ORDER — BUPIVACAINE HCL (PF) 0.25 % IJ SOLN
INTRAMUSCULAR | Status: AC
  Filled 2014-07-21: qty 30

## 2014-07-21 MED ORDER — 0.9 % SODIUM CHLORIDE (POUR BTL) OPTIME
TOPICAL | Status: DC | PRN
  Administered 2014-07-21: 1000 mL

## 2014-07-21 MED ORDER — ACETAMINOPHEN 325 MG PO TABS: 650.0000 mg | ORAL_TABLET | Freq: Four times a day (QID) | ORAL | Status: DC | PRN

## 2014-07-21 MED ORDER — PROPOFOL 10 MG/ML IV BOLUS
INTRAVENOUS | Status: DC | PRN
Start: 2014-07-21 — End: 2014-07-21
  Administered 2014-07-21: 140 mg via INTRAVENOUS
  Administered 2014-07-21: 20 mg via INTRAVENOUS

## 2014-07-21 MED ORDER — MIDAZOLAM HCL 5 MG/5ML IJ SOLN
INTRAMUSCULAR | Status: DC | PRN
  Administered 2014-07-21: 2 mg via INTRAVENOUS

## 2014-07-21 MED ORDER — ARTIFICIAL TEARS OP OINT
TOPICAL_OINTMENT | OPHTHALMIC | Status: AC
  Filled 2014-07-21: qty 3.5

## 2014-07-21 MED ORDER — DOCUSATE SODIUM 100 MG PO CAPS
100.0000 mg | ORAL_CAPSULE | Freq: Two times a day (BID) | ORAL | Status: DC
  Administered 2014-07-21 – 2014-07-24 (×6): 100 mg via ORAL
  Filled 2014-07-21 (×7): qty 1

## 2014-07-21 MED ORDER — LACTATED RINGERS IV SOLN
INTRAVENOUS | Status: DC | PRN
  Administered 2014-07-21 (×2): via INTRAVENOUS

## 2014-07-21 MED ORDER — ONDANSETRON HCL 4 MG PO TABS: 4.0000 mg | ORAL_TABLET | Freq: Three times a day (TID) | ORAL | Status: DC | PRN

## 2014-07-21 MED ORDER — OXYCODONE HCL 5 MG PO TABS: 5.0000 mg | ORAL_TABLET | Freq: Once | ORAL | Status: DC | PRN

## 2014-07-21 MED ORDER — PRAVASTATIN SODIUM 40 MG PO TABS
40.0000 mg | ORAL_TABLET | Freq: Every day | ORAL | Status: DC
  Administered 2014-07-21 – 2014-07-24 (×4): 40 mg via ORAL
  Filled 2014-07-21 (×4): qty 1

## 2014-07-21 MED ORDER — FENTANYL CITRATE 0.05 MG/ML IJ SOLN
INTRAMUSCULAR | Status: DC | PRN
  Administered 2014-07-21 (×2): 50 ug via INTRAVENOUS
  Administered 2014-07-21: 100 ug via INTRAVENOUS
  Administered 2014-07-21 (×2): 50 ug via INTRAVENOUS
  Administered 2014-07-21: 100 ug via INTRAVENOUS
  Administered 2014-07-21: 50 ug via INTRAVENOUS
  Administered 2014-07-21: 100 ug via INTRAVENOUS
  Administered 2014-07-21: 50 ug via INTRAVENOUS

## 2014-07-21 MED ORDER — INFLUENZA VAC SPLIT QUAD 0.5 ML IM SUSY
0.5000 mL | PREFILLED_SYRINGE | INTRAMUSCULAR | Status: AC
  Administered 2014-07-22: 0.5 mL via INTRAMUSCULAR
  Filled 2014-07-21: qty 0.5

## 2014-07-21 MED ORDER — HYDROMORPHONE HCL 1 MG/ML IJ SOLN
INTRAMUSCULAR | Status: DC | PRN
  Administered 2014-07-21 (×2): .5 mg via INTRAVENOUS

## 2014-07-21 MED ORDER — OXYCODONE-ACETAMINOPHEN 5-325 MG PO TABS: 1.0000 | ORAL_TABLET | ORAL | Status: DC | PRN

## 2014-07-21 MED ORDER — PROPOFOL 10 MG/ML IV BOLUS
INTRAVENOUS | Status: AC
Start: 2014-07-21 — End: 2014-07-21
  Filled 2014-07-21: qty 20

## 2014-07-21 MED ORDER — EPHEDRINE SULFATE 50 MG/ML IJ SOLN
INTRAMUSCULAR | Status: AC
  Filled 2014-07-21: qty 1

## 2014-07-21 MED ORDER — ZOLPIDEM TARTRATE 5 MG PO TABS: 5.0000 mg | ORAL_TABLET | Freq: Every evening | ORAL | Status: DC | PRN

## 2014-07-21 MED ORDER — SODIUM CHLORIDE 0.9 % IJ SOLN
INTRAMUSCULAR | Status: DC | PRN
  Administered 2014-07-21: 40 mL

## 2014-07-21 MED ORDER — ACETAMINOPHEN 10 MG/ML IV SOLN
INTRAVENOUS | Status: AC
  Filled 2014-07-21: qty 100

## 2014-07-21 MED ORDER — MUPIROCIN 2 % EX OINT
1.0000 "application " | TOPICAL_OINTMENT | Freq: Every day | CUTANEOUS | Status: DC
  Administered 2014-07-22 – 2014-07-23 (×2): 1 via TOPICAL
  Filled 2014-07-21: qty 22

## 2014-07-21 MED ORDER — SUCCINYLCHOLINE CHLORIDE 20 MG/ML IJ SOLN
INTRAMUSCULAR | Status: AC
  Filled 2014-07-21: qty 1

## 2014-07-21 MED ORDER — BUPIVACAINE LIPOSOME 1.3 % IJ SUSP
20.0000 mL | Freq: Once | INTRAMUSCULAR | Status: AC
  Administered 2014-07-21: 20 mL
  Filled 2014-07-21: qty 20

## 2014-07-21 MED ORDER — SODIUM CHLORIDE 0.9 % IJ SOLN
INTRAMUSCULAR | Status: AC
  Filled 2014-07-21: qty 10

## 2014-07-21 MED ORDER — ACETAMINOPHEN 160 MG/5ML PO SOLN
325.0000 mg | ORAL | Status: DC | PRN
  Filled 2014-07-21: qty 20.3

## 2014-07-21 MED ORDER — NEOSTIGMINE METHYLSULFATE 10 MG/10ML IV SOLN
INTRAVENOUS | Status: AC
  Filled 2014-07-21: qty 1

## 2014-07-21 MED ORDER — ALUM & MAG HYDROXIDE-SIMETH 200-200-20 MG/5ML PO SUSP
30.0000 mL | ORAL | Status: DC | PRN
  Filled 2014-07-21: qty 30

## 2014-07-21 MED ORDER — CEFAZOLIN SODIUM-DEXTROSE 2-3 GM-% IV SOLR
INTRAVENOUS | Status: DC | PRN
  Administered 2014-07-21: 2 g via INTRAVENOUS

## 2014-07-21 MED ORDER — INSULIN ASPART 100 UNIT/ML ~~LOC~~ SOLN
SUBCUTANEOUS | Status: AC
  Filled 2014-07-21: qty 2

## 2014-07-21 MED ORDER — CELECOXIB 200 MG PO CAPS
200.0000 mg | ORAL_CAPSULE | Freq: Two times a day (BID) | ORAL | Status: DC
  Administered 2014-07-21 – 2014-07-24 (×7): 200 mg via ORAL
  Filled 2014-07-21 (×8): qty 1

## 2014-07-21 MED ORDER — PHENOL 1.4 % MT LIQD: 1.0000 | OROMUCOSAL | Status: DC | PRN

## 2014-07-21 MED ORDER — HYDROMORPHONE HCL 1 MG/ML IJ SOLN
0.2500 mg | INTRAMUSCULAR | Status: DC | PRN
  Administered 2014-07-21: 0.5 mg via INTRAVENOUS

## 2014-07-21 MED ORDER — POTASSIUM CHLORIDE IN NACL 20-0.9 MEQ/L-% IV SOLN
INTRAVENOUS | Status: DC
  Administered 2014-07-21: 100 mL/h via INTRAVENOUS
  Administered 2014-07-22: 04:00:00 via INTRAVENOUS
  Filled 2014-07-21 (×3): qty 1000

## 2014-07-21 MED ORDER — ACETAMINOPHEN 650 MG RE SUPP: 650.0000 mg | Freq: Four times a day (QID) | RECTAL | Status: DC | PRN

## 2014-07-21 MED ORDER — METHOCARBAMOL 500 MG PO TABS: 500.0000 mg | ORAL_TABLET | Freq: Four times a day (QID) | ORAL | Status: DC

## 2014-07-21 MED ORDER — ACETAMINOPHEN 10 MG/ML IV SOLN
INTRAVENOUS | Status: DC | PRN
  Administered 2014-07-21: 1000 mg via INTRAVENOUS

## 2014-07-21 MED ORDER — METOCLOPRAMIDE HCL 10 MG PO TABS: 5.0000 mg | ORAL_TABLET | Freq: Three times a day (TID) | ORAL | Status: DC | PRN

## 2014-07-21 SURGICAL SUPPLY — 86 items
BANDAGE ELASTIC 6 VELCRO ST LF (GAUZE/BANDAGES/DRESSINGS) ×6 IMPLANT
BANDAGE ESMARK 6X9 LF (GAUZE/BANDAGES/DRESSINGS) IMPLANT
BENZOIN TINCTURE PRP APPL 2/3 (GAUZE/BANDAGES/DRESSINGS) ×3 IMPLANT
BLADE SAW SGTL 18X1.27X75 (BLADE) ×2 IMPLANT
BLADE SAW SGTL 18X1.27X75MM (BLADE) ×1
BLADE SURG ROTATE 9660 (MISCELLANEOUS) IMPLANT
BNDG ESMARK 6X9 LF (GAUZE/BANDAGES/DRESSINGS)
BOOTCOVER CLEANROOM LRG (PROTECTIVE WEAR) IMPLANT
CAPT HIP TOTAL 2 ×3 IMPLANT
COVER SURGICAL LIGHT HANDLE (MISCELLANEOUS) ×3 IMPLANT
CUFF TOURNIQUET SINGLE 18IN (TOURNIQUET CUFF) IMPLANT
CUFF TOURNIQUET SINGLE 24IN (TOURNIQUET CUFF) IMPLANT
CUFF TOURNIQUET SINGLE 34IN LL (TOURNIQUET CUFF) IMPLANT
CUFF TOURNIQUET SINGLE 44IN (TOURNIQUET CUFF) IMPLANT
DECANTER SPIKE VIAL GLASS SM (MISCELLANEOUS) IMPLANT
DRAPE C-ARM 42X72 X-RAY (DRAPES) IMPLANT
DRAPE IMP U-DRAPE 54X76 (DRAPES) ×9 IMPLANT
DRAPE INCISE IOBAN 66X45 STRL (DRAPES) ×3 IMPLANT
DRAPE OEC MINIVIEW 54X84 (DRAPES) IMPLANT
DRAPE ORTHO SPLIT 77X108 STRL (DRAPES) ×4
DRAPE PROXIMA HALF (DRAPES) ×3 IMPLANT
DRAPE SURG 17X23 STRL (DRAPES) ×3 IMPLANT
DRAPE SURG ORHT 6 SPLT 77X108 (DRAPES) ×2 IMPLANT
DRAPE U-SHAPE 47X51 STRL (DRAPES) ×3 IMPLANT
DRAPE X RAY CASS MED 25220 (DRAPES) IMPLANT
DRAPE X-RAY CASS 24X20 (DRAPES) IMPLANT
DRSG AQUACEL AG ADV 3.5X10 (GAUZE/BANDAGES/DRESSINGS) ×3 IMPLANT
DRSG PAD ABDOMINAL 8X10 ST (GAUZE/BANDAGES/DRESSINGS) IMPLANT
DURAPREP 26ML APPLICATOR (WOUND CARE) ×3 IMPLANT
ELECT BLADE 4.0 EZ CLEAN MEGAD (MISCELLANEOUS)
ELECT CAUTERY BLADE 6.4 (BLADE) ×3 IMPLANT
ELECT REM PT RETURN 9FT ADLT (ELECTROSURGICAL) ×3
ELECTRODE BLDE 4.0 EZ CLN MEGD (MISCELLANEOUS) IMPLANT
ELECTRODE REM PT RTRN 9FT ADLT (ELECTROSURGICAL) ×1 IMPLANT
FACESHIELD WRAPAROUND (MASK) ×6 IMPLANT
GAUZE SPONGE 4X4 12PLY STRL (GAUZE/BANDAGES/DRESSINGS) IMPLANT
GAUZE XEROFORM 1X8 LF (GAUZE/BANDAGES/DRESSINGS) IMPLANT
GLOVE BIO SURGEON STRL SZ 6.5 (GLOVE) ×2 IMPLANT
GLOVE BIO SURGEON STRL SZ8 (GLOVE) ×3 IMPLANT
GLOVE BIO SURGEONS STRL SZ 6.5 (GLOVE) ×1
GLOVE BIOGEL PI IND STRL 7.0 (GLOVE) ×1 IMPLANT
GLOVE BIOGEL PI IND STRL 8 (GLOVE) ×1 IMPLANT
GLOVE BIOGEL PI INDICATOR 7.0 (GLOVE) ×2
GLOVE BIOGEL PI INDICATOR 8 (GLOVE) ×2
GLOVE ECLIPSE 6.5 STRL STRAW (GLOVE) IMPLANT
GLOVE ORTHO TXT STRL SZ7.5 (GLOVE) ×6 IMPLANT
GOWN STRL REUS W/ TWL LRG LVL3 (GOWN DISPOSABLE) ×2 IMPLANT
GOWN STRL REUS W/ TWL XL LVL3 (GOWN DISPOSABLE) ×1 IMPLANT
GOWN STRL REUS W/TWL 2XL LVL3 (GOWN DISPOSABLE) ×3 IMPLANT
GOWN STRL REUS W/TWL LRG LVL3 (GOWN DISPOSABLE) ×4
GOWN STRL REUS W/TWL XL LVL3 (GOWN DISPOSABLE) ×2
KIT BASIN OR (CUSTOM PROCEDURE TRAY) ×3 IMPLANT
KIT ROOM TURNOVER OR (KITS) ×3 IMPLANT
MANIFOLD NEPTUNE II (INSTRUMENTS) ×3 IMPLANT
NDL SAFETY ECLIPSE 18X1.5 (NEEDLE) ×1 IMPLANT
NEEDLE HYPO 18GX1.5 SHARP (NEEDLE) ×2
NS IRRIG 1000ML POUR BTL (IV SOLUTION) ×3 IMPLANT
PACK ORTHO EXTREMITY (CUSTOM PROCEDURE TRAY) ×3 IMPLANT
PACK TOTAL JOINT (CUSTOM PROCEDURE TRAY) ×3 IMPLANT
PACK UNIVERSAL I (CUSTOM PROCEDURE TRAY) ×3 IMPLANT
PAD ARMBOARD 7.5X6 YLW CONV (MISCELLANEOUS) ×6 IMPLANT
PAD CAST 4YDX4 CTTN HI CHSV (CAST SUPPLIES) IMPLANT
PADDING CAST COTTON 4X4 STRL (CAST SUPPLIES)
SPONGE LAP 4X18 X RAY DECT (DISPOSABLE) IMPLANT
STAPLER VISISTAT 35W (STAPLE) IMPLANT
SUCTION FRAZIER TIP 10 FR DISP (SUCTIONS) ×3 IMPLANT
SUT ETHIBOND NAB CT1 #1 30IN (SUTURE) ×6 IMPLANT
SUT MNCRL AB 4-0 PS2 18 (SUTURE) ×3 IMPLANT
SUT VIC AB 0 CT1 27 (SUTURE) ×2
SUT VIC AB 0 CT1 27XBRD ANBCTR (SUTURE) ×1 IMPLANT
SUT VIC AB 0 CTB1 27 (SUTURE) IMPLANT
SUT VIC AB 1 CT1 27 (SUTURE) ×6
SUT VIC AB 1 CT1 27XBRD ANBCTR (SUTURE) ×3 IMPLANT
SUT VIC AB 2-0 CT1 27 (SUTURE) ×4
SUT VIC AB 2-0 CT1 TAPERPNT 27 (SUTURE) ×2 IMPLANT
SUT VIC AB 2-0 CTB1 (SUTURE) IMPLANT
SYR 50ML LL SCALE MARK (SYRINGE) ×3 IMPLANT
SYR CONTROL 10ML LL (SYRINGE) IMPLANT
TOWEL OR 17X24 6PK STRL BLUE (TOWEL DISPOSABLE) ×3 IMPLANT
TOWEL OR 17X26 10 PK STRL BLUE (TOWEL DISPOSABLE) ×3 IMPLANT
TRAY FOLEY CATH 14FR (SET/KITS/TRAYS/PACK) IMPLANT
TUBE CONNECTING 12'X1/4 (SUCTIONS) ×1
TUBE CONNECTING 12X1/4 (SUCTIONS) ×2 IMPLANT
UNDERPAD 30X30 INCONTINENT (UNDERPADS AND DIAPERS) IMPLANT
WATER STERILE IRR 1000ML POUR (IV SOLUTION) IMPLANT
YANKAUER SUCT BULB TIP NO VENT (SUCTIONS) IMPLANT

## 2014-07-21 NOTE — Transfer of Care (Signed)
Immediate Anesthesia Transfer of Care Note  Patient: Gregory May  Procedure(s) Performed: Procedure(s): TOTAL HIP ARTHROPLASTY ANTERIOR APPROACH (Left) HARDWARE REMOVAL (Left)  Patient Location: PACU  Anesthesia Type:General  Level of Consciousness: awake, alert  and oriented  Airway & Oxygen Therapy: Patient Spontanous Breathing and Patient connected to nasal cannula oxygen  Post-op Assessment: Report given to PACU RN and Post -op Vital signs reviewed and stable  Post vital signs: Reviewed and stable  Complications: No apparent anesthesia complications

## 2014-07-21 NOTE — Anesthesia Preprocedure Evaluation (Addendum)
Anesthesia Evaluation  Patient identified by MRN, date of birth, ID band Patient awake    Reviewed: Allergy & Precautions, H&P , NPO status , Patient's Chart, lab work & pertinent test results  History of Anesthesia Complications Negative for: history of anesthetic complications  Airway Mallampati: II  TM Distance: >3 FB Neck ROM: Full    Dental  (+)    Pulmonary PE breath sounds clear to auscultation        Cardiovascular negative cardio ROS  Rhythm:Regular     Neuro/Psych negative neurological ROS  negative psych ROS   GI/Hepatic negative GI ROS, Neg liver ROS,   Endo/Other  diabetes, Type 2, Insulin Dependent  Renal/GU negative Renal ROS     Musculoskeletal  (+) Arthritis -,   Abdominal   Peds  Hematology eliquis held > 48 hrs   Anesthesia Other Findings   Reproductive/Obstetrics                            Anesthesia Physical Anesthesia Plan  ASA: II  Anesthesia Plan: General   Post-op Pain Management:    Induction: Intravenous  Airway Management Planned: Oral ETT  Additional Equipment: None  Intra-op Plan:   Post-operative Plan: Extubation in OR  Informed Consent: I have reviewed the patients History and Physical, chart, labs and discussed the procedure including the risks, benefits and alternatives for the proposed anesthesia with the patient or authorized representative who has indicated his/her understanding and acceptance.   Dental advisory given  Plan Discussed with: CRNA and Surgeon  Anesthesia Plan Comments:         Anesthesia Quick Evaluation

## 2014-07-21 NOTE — Discharge Summary (Addendum)
Patient ID: Gregory MornVergil T Even MRN: [id number] DOB/AGE: 1960-12-19 53 y.o.  Admit date: 07/21/2014 Discharge date: 07/24/2014  Admission Diagnoses:  Active Problems:   DJD (degenerative joint disease) of knee   Discharge Diagnoses:  Same  Past Medical History  Diagnosis Date  . Sebaceous cyst     on back of neck  . Diabetes mellitus     type II  . Pulmonary embolism   . Deep vein thrombosis (DVT)   . Arthritis     bilateral hips  . Diabetic ulcer of heel     Right heel  . DJD (degenerative joint disease)     Surgeries: Procedure(s): TOTAL HIP ARTHROPLASTY ANTERIOR APPROACH HARDWARE REMOVAL on 07/21/2014   Consultants:    Discharged Condition: Improved  Hospital Course: Gregory May is an 53 y.o. male who was admitted 07/21/2014 for operative treatment of primary osteoarthritis left hip. Patient has severe unremitting pain that affects sleep, daily activities, and work/hobbies. After pre-op clearance the patient was taken to the operating room on 07/21/2014 and underwent  Procedure(s): TOTAL HIP ARTHROPLASTY ANTERIOR APPROACH HARDWARE REMOVAL.  Patient with a pre-op Hb of 14.5 developed ABLA on POD #1 with a  Hb of 11.0.  POD #2 Hb to 10.4 and POD #3 Hb dropped to 9.7.  Gregory May was lightheaded upon standing following surgery, but this has since resolved.  Patient was given perioperative antibiotics:  Anti-infectives    Start     Dose/Rate Route Frequency Ordered Stop   07/21/14 1500  ceFAZolin (ANCEF) IVPB 2 g/50 mL premix     2 g100 mL/hr over 30 Minutes Intravenous Every 6 hours 07/21/14 1157 07/21/14 2143   07/21/14 0600  ceFAZolin (ANCEF) IVPB 2 g/50 mL premix  Status:  Discontinued     2 g100 mL/hr over 30 Minutes Intravenous On call to O.R. 07/20/14 1424 07/21/14 1149       Patient was given sequential compression devices, early ambulation, and chemoprophylaxis to prevent DVT.  Patient benefited maximally from hospital stay and there were no complications.     Recent vital signs:  Patient Vitals for the past 24 hrs:  BP Temp Temp src Pulse Resp SpO2  07/24/14 0518 110/67 mmHg 98.2 F (36.8 C) Oral 91 20 93 %  07/23/14 2040 109/76 mmHg 98.4 F (36.9 C) Oral 90 17 96 %  07/23/14 1600 - - - - 16 -  07/23/14 1525 111/62 mmHg 97.3 F (36.3 C) Oral 86 18 97 %  07/23/14 1200 - - - - 18 -     Recent laboratory studies:   Recent Labs  07/23/14 0500 07/24/14 0513  WBC 9.5 8.6  HGB 10.4* 9.7*  HCT 32.0* 29.3*  PLT 226 227  NA 138 137  K 4.2 4.5  CL 102 101  CO2 24 25  BUN 10 11  CREATININE 0.72 0.73  GLUCOSE 144* 147*  CALCIUM 8.5 8.9     Discharge Medications:     Diagnostic Studies: Dg Hip Portable 1 View Left  07/21/2014   CLINICAL DATA:  Left hip replacement.  EXAM: PORTABLE LEFT HIP - 1 VIEW  COMPARISON:  None.  FINDINGS: Total left hip replacement with good anatomic alignment. Prosthesis intact. No acute bony abnormality.  IMPRESSION: Total left hip replacement with good anatomic alignment.   Electronically Signed   By: Maisie Fushomas  Register   On: 07/21/2014 13:05    Disposition: 01-Home or Self Care  Discharge Instructions    Call MD / Call 911  Complete by:  As directed   If you experience chest pain or shortness of breath, CALL 911 and be transported to the hospital emergency room.  If you develope a fever above 101 F, pus (white drainage) or increased drainage or redness at the wound, or calf pain, call your surgeon's office.     Change dressing    Complete by:  As directed   Change the dressing daily with sterile 4 x 4 inch gauze dressing and paper tape.  You may clean the incision with alcohol prior to redressing     Constipation Prevention    Complete by:  As directed   Drink plenty of fluids.  Prune juice may be helpful.  You may use a stool softener, such as Colace (over the counter) 100 mg twice a day.  Use MiraLax (over the counter) for constipation as needed.     Diet - low sodium heart healthy    Complete by:   As directed      Discharge instructions    Complete by:  As directed   Weight bearing as tolerated.  Resume Eliquis as directed (5mg  twice daily).  Change bandage daily starting on Saturday.  May shower on Monday, but do not soak incision.  May apply ice for tup to 20 minutes at a time for pain and swelling.  Follow up appointment in two weeks.     Do not put a pillow under the knee. Place it under the heel.    Complete by:  As directed   Place gray foam under operative heel when in bed or in a chair to work on extension     Follow the hip precautions as taught in Physical Therapy    Complete by:  As directed   Posterior total hip precautions.  Weight bearing as tolerated     Increase activity slowly as tolerated    Complete by:  As directed      TED hose    Complete by:  As directed   Use stockings (TED hose) for 2-3 weeks on both leg(s).  You may remove them at night for sleeping.           Follow-up Information    [facility] Schedule an appointment as soon as possible for a visit in 2 weeks.   Specialty:  Orthopedic Surgery   Contact information:   [address] [place] [address] [phone]       Follow up with [facility].   Why:  They will contact you to schedule home physical therapy visits.    Contact information:   [address] [place] [address] [phone]        Signed: Gearldine ShownNTON, M. LINDSEY 07/24/2014, 8:21 AM

## 2014-07-21 NOTE — Evaluation (Signed)
Physical Therapy Evaluation Patient Details Name: Gregory May MRN: [id number] DOB: 09-23-1960 Today's Date: 07/21/2014   History of Present Illness  L THR  Clinical Impression  Pt s/p L THR presents with decreased L LE strength/ROM, general deconditioning and post op pain limiting functional mobility.  Pt would benefit from follow up rehab at SNF level to maximize IND and safety prior to return home with limited assist.    Follow Up Recommendations SNF    Equipment Recommendations  Rolling walker with 5" wheels    Recommendations for Other Services OT consult     Precautions / Restrictions Precautions Precautions: Fall Restrictions Weight Bearing Restrictions: No LLE Weight Bearing: Weight bearing as tolerated      Mobility  Bed Mobility Overal bed mobility: Needs Assistance Bed Mobility: Sit to Supine;Supine to Sit     Supine to sit: Mod assist Sit to supine: Mod assist   General bed mobility comments: cues for sequence and use of R LE to self assist`  Transfers Overall transfer level: Needs assistance Equipment used: Rolling walker (2 wheeled) Transfers: Sit to/from Stand Sit to Stand: Min assist;Mod assist;From elevated surface         General transfer comment: cues for LE management and use of UEs to self assist  Ambulation/Gait Ambulation/Gait assistance: Min assist;Mod assist Ambulation Distance (Feet): 10 Feet Assistive device: Rolling walker (2 wheeled) Gait Pattern/deviations: Step-to pattern;Decreased step length - right;Decreased step length - left;Shuffle;Antalgic;Trunk flexed Gait velocity: decr Gait velocity interpretation: Below normal speed for age/gender General Gait Details: Cues for sequence, posture, position from RW.   Stairs            Wheelchair Mobility    Modified Rankin (Stroke Patients Only)       Balance                                             Pertinent Vitals/Pain Pain Assessment:  0-10 Pain Score: 7  Pain Location: L hip and thigh Pain Descriptors / Indicators: Aching;Sore Pain Intervention(s): Limited activity within patient's tolerance;Monitored during session;Premedicated before session    Home Living Family/patient expects to be discharged to:: Skilled nursing facility Living Arrangements: Alone               Additional Comments: Pt states his aunt can assist but works and 24/7 assist is not available    Prior Function Level of Independence: Independent with assistive device(s)         Comments: crutches     Hand Dominance   Dominant Hand: Right    Extremity/Trunk Assessment   Upper Extremity Assessment: Overall WFL for tasks assessed           Lower Extremity Assessment: LLE deficits/detail   LLE Deficits / Details: Hip strength 2/5 with AAROM at hip to 60 flex and 10 abd     Communication   Communication: No difficulties  Cognition Arousal/Alertness: Awake/alert Behavior During Therapy: WFL for tasks assessed/performed Overall Cognitive Status: Within Functional Limits for tasks assessed                      General Comments      Exercises Total Joint Exercises Ankle Circles/Pumps: AROM;Both;15 reps;Supine Quad Sets: AROM;Both;10 reps;Supine Heel Slides: AAROM;Left;15 reps;Supine Hip ABduction/ADduction: AAROM;Left;Supine;5 reps      Assessment/Plan    PT Assessment Patient needs continued PT  services  PT Diagnosis Difficulty walking   PT Problem List Decreased strength;Decreased range of motion;Decreased activity tolerance;Decreased mobility;Decreased knowledge of use of DME;Pain  PT Treatment Interventions DME instruction;Gait training;Stair training;Functional mobility training;Therapeutic activities;Therapeutic exercise;Patient/family education   PT Goals (Current goals can be found in the Care Plan section) Acute Rehab PT Goals Patient Stated Goal: Walk without pain and without fear of leg buckling PT  Goal Formulation: With patient Time For Goal Achievement: 07/28/14 Potential to Achieve Goals: Good    Frequency 7X/week   Barriers to discharge Decreased caregiver support Does not have 24/7 assist    Co-evaluation               End of Session Equipment Utilized During Treatment: Gait belt Activity Tolerance: Patient limited by pain Patient left: in bed;with call bell/phone within reach;with family/visitor present Nurse Communication: Mobility status         Time: [id number] PT Time Calculation (min) (ACUTE ONLY): 50 min   Charges:   PT Evaluation $Initial PT Evaluation Tier I: 1 Procedure PT Treatments $Gait Training: 8-22 mins $Therapeutic Exercise: 8-22 mins $Therapeutic Activity: 8-22 mins   PT G Codes:          Evalise Abruzzese 07/21/2014, 4:52 PM

## 2014-07-22 LAB — CBC
HCT: 33.7 % — ABNORMAL LOW (ref 39.0–52.0)
Hemoglobin: 11 g/dL — ABNORMAL LOW (ref 13.0–17.0)
MCH: 31.3 pg (ref 26.0–34.0)
MCHC: 32.6 g/dL (ref 30.0–36.0)
MCV: 96 fL (ref 78.0–100.0)
Platelets: 241 10*3/uL (ref 150–400)
RBC: 3.51 MIL/uL — ABNORMAL LOW (ref 4.22–5.81)
RDW: 13.8 % (ref 11.5–15.5)
WBC: 12.1 10*3/uL — ABNORMAL HIGH (ref 4.0–10.5)

## 2014-07-22 LAB — GLUCOSE, CAPILLARY
Glucose-Capillary: 176 mg/dL — ABNORMAL HIGH (ref 70–99)
Glucose-Capillary: 177 mg/dL — ABNORMAL HIGH (ref 70–99)
Glucose-Capillary: 220 mg/dL — ABNORMAL HIGH (ref 70–99)
Glucose-Capillary: 228 mg/dL — ABNORMAL HIGH (ref 70–99)

## 2014-07-22 LAB — BASIC METABOLIC PANEL
Anion gap: 11 (ref 5–15)
BUN: 15 mg/dL (ref 6–23)
CO2: 25 mEq/L (ref 19–32)
Calcium: 8.6 mg/dL (ref 8.4–10.5)
Chloride: 97 mEq/L (ref 96–112)
Creatinine, Ser: 0.82 mg/dL (ref 0.50–1.35)
GFR calc Af Amer: >90 mL/min (ref >90)
GFR calc non Af Amer: >90 mL/min (ref >90)
Glucose, Bld: 191 mg/dL — ABNORMAL HIGH (ref 70–99)
Potassium: 4.3 mEq/L (ref 3.7–5.3)
Sodium: 133 mEq/L — ABNORMAL LOW (ref 137–147)

## 2014-07-22 NOTE — Plan of Care (Signed)
Problem: Phase I Progression Outcomes Goal: CMS/Neurovascular status WDL Outcome: Completed/Met Date Met:  07/22/14     

## 2014-07-22 NOTE — Plan of Care (Signed)
Problem: Consults Goal: Diagnosis- Total Joint Replacement Primary Total Hip Left     

## 2014-07-22 NOTE — Progress Notes (Signed)
CARE MANAGEMENT NOTE 07/22/2014  Patient:  Gregory May,Gregory May   Account Number:  [id number]  Date Initiated:  07/22/2014  Documentation [facility]:  [facility]  Subjective/Objective Assessment:   s/p left total hip replacement     Action/Plan:   PT/OT evals-recommended SNF   Anticipated DC Date:  07/23/2014   Anticipated DC Plan:  SKILLED NURSING FACILITY  In-house referral  Clinical Social Worker      DC Planning Services  CM consult      Choice offered to / List presented to:             Status of service:  Completed, signed off Medicare Important Message given?   (If response is "NO", the following Medicare IM given date fields will be blank) Date Medicare IM given:   Medicare IM given by:   Date Additional Medicare IM given:   Additional Medicare IM given by:    Discharge Disposition:  SKILLED NURSING FACILITY  Per UR Regulation:    If discussed at Long Length of Stay Meetings, dates discussed:    Comments:  07/22/14 PT/OT recommended SNF. Referral made to CSW. CM will contine to follow.

## 2014-07-22 NOTE — Anesthesia Postprocedure Evaluation (Signed)
  Anesthesia Post-op Note  Patient: Gregory May  Procedure(s) Performed: Procedure(s): TOTAL HIP ARTHROPLASTY ANTERIOR APPROACH (Left) HARDWARE REMOVAL (Left)  Patient Location: PACU  Anesthesia Type:MAC and Spinal  Level of Consciousness: awake  Airway and Oxygen Therapy: Patient Spontanous Breathing  Post-op Pain: mild  Post-op Assessment: Post-op Vital signs reviewed, Patient's Cardiovascular Status Stable, Respiratory Function Stable, Patent Airway, No signs of Nausea or vomiting and Pain level controlled  Post-op Vital Signs: Reviewed and stable  Last Vitals:  Filed Vitals:   07/22/14 0540  BP: 111/61  Pulse: 84  Temp: 37.1 C  Resp: 14    Complications: No apparent anesthesia complications

## 2014-07-22 NOTE — Discharge Instructions (Signed)
Total Hip Replacement, Care After Refer to this sheet in the next few weeks. These instructions provide you with information on caring for yourself after your procedure. Your health care provider may also give you specific instructions. Your treatment has been planned according to the most current medical practices, but problems sometimes occur. Call your health care provider if you have any problems or questions after your procedure. HOME CARE INSTRUCTIONS   Weight bearing as tolerated.  Resume regular dose of Eliquis (5 mg twice daily) to prevent blood clots.  Change dressing daily starting on Saturday.  May shower on Monday, but do not soak incision.  May apply ice for up to 20 minutes at a time.  Follow up appointment in two weeks. Your health care provider will give you specific precautions for certain types of movement. Additional instructions include:  Take medicines only as directed by your health care provider.  Take quick showers (3-5 min) rather than bathe until your health care provider tells you that you can take baths again.  Avoid lifting until your health care provider instructs you otherwise.  Use a raised toilet seat and avoid sitting in low chairs as instructed by your health care provider.  Use crutches or a walker as instructed by your health care provider. SEEK MEDICAL CARE IF:  You have difficulty breathing.  You have drainage, redness, or swelling at your incision site.  You have a bad smell coming from your incision site.  You have persistent bleeding from your incision site.  Your incision breaks open after sutures (stitches) or staples have been removed.  You have a fever. SEEK IMMEDIATE MEDICAL CARE IF:   You have a rash.  You have pain or swelling in your calf or thigh.  You have shortness of breath or chest pain. MAKE SURE YOU:  Understand these instructions.  Will watch your condition.  Will get help if you are not doing well or get  worse. Document Released: 02/16/2005 Document Revised: 12/14/2013 Document Reviewed: 09/30/2013 [facility] Patient Information 2015 [place], [place]. This information is not intended to replace advice given to you by your health care provider. Make sure you discuss any questions you have with your health care provider.  Anticoagulation, Generic Anticoagulants are medicines used to prevent clots from developing in your veins. These medicine are also known as blood thinners. If blood clots are untreated, they could travel to your lungs. This is called a pulmonary embolus. A blood clot in your lungs can be fatal.  Health care providers often use anticoagulants to prevent clots following surgery. Anticoagulants are also used along with aspirin when the heart is not getting enough blood. Another anticoagulant called warfarin is started 2 to 3 days after a rapid-acting injectable anticoagulant is started. The rapid-acting anticoagulants are usually continued until warfarin has begun to work. Your health care provider will judge this length of time by blood tests known as the prothrombin time (PT) and International Normalization Ratio (INR). This means that your blood is at the necessary and best level to prevent clots. RISKS AND COMPLICATIONS  If you have received recent epidural anesthesia, spinal anesthesia, or a spinal tap while receiving anticoagulants, you are at risk for developing a blood clot in or around the spine. This condition could result in long-term or permanent paralysis.  Because anticoagulants thin your blood, severe bleeding may occur from any tissue or organ. Symptoms of the blood being too thin may include:  Bleeding from the nose or gums that does not  stop quickly.  Blood in bowel movements which may appear as bright red, dark, or black tarry stools.  Blood in the urine which may appear as pink, red, or brown urine.  Unusual bruising or bruising easily.  A cut that does not stop  bleeding within 10 minutes.  Vomiting blood or continuous nausea for more than 1 day.  Coughing up blood.  Broken blood vessels in your eye (subconjunctival hemorrhage).  Abdominal or back pain with or without flank bruising.  Sudden, severe headache.  Sudden weakness or numbness of the face, arm, or leg, especially on one side of the body.  Sudden confusion.  Trouble speaking (aphasia) or understanding.  Sudden trouble seeing in one or both eyes.  Sudden trouble walking.  Dizziness.  Loss of balance or coordination.  Vaginal bleeding.  Swelling or pain at an injection site.  Superficial fat tissue death (necrosis) which may cause skin scarring. This is more common in women and may first present as pain in the waist, thighs, or buttocks.  Fever.  Too little anticoagulation continues to allow the risk for blood clots. HOME CARE INSTRUCTIONS   Due to the complications of anticoagulants, it is very important that you take your anticoagulant as directed by your health care provider. Anticoagulants need to be taken exactly as instructed. Be sure you understand all your anticoagulant instructions.  Keep all follow-up appointments with your health care provider as directed. It is very important to keep your appointments. Not keeping appointments could result in a chronic or permanent injury, pain, or disability.  Warfarin. Your health care provider will advise you on the length of treatment (usually 3-6 months, sometimes lifelong).  Take warfarin exactly as directed by your health care provider. It is recommended that you take your warfarin dose at the same time of the day. It is preferred that you take warfarin in the late afternoon. If you have been told to stop taking warfarin, do not resume taking warfarin until directed to do so by your health care provider. Follow your health care provider's instructions if you accidentally take an extra dose or miss a dose of warfarin. It  is very important to take warfarin as directed since bleeding or blood clots could result in chronic or permanent injury, pain, or disability.  Too much and too little warfarin are both dangerous. Too much warfarin increases the risk of bleeding. Too little warfarin continues to allow the risk for blood clots. While taking warfarin, you will need to have regular blood tests to measure your blood clotting time. These blood tests usually include both the prothrombin time (PT) and International Normalized Ratio (INR) tests. The PT and INR results allow your health care provider to adjust your dose of warfarin. The dose can change for many reasons. It is critically important that you have your PT and INR levels drawn exactly as directed. Your warfarin dose may stay the same or change depending on what the PT and INR results are. Be sure to follow up with your health care provider regarding your PT and INR test results and what your warfarin dosage should be.  Many medicines can interfere with warfarin and affect the PT and INR results. You must tell your health care provider about any and all medicines you take, this includes all vitamins and supplements. Ask your health care provider before taking these. Prescription and over-the-counter medicine consistency is critical to warfarin management. It is important that potential interactions are checked before you start a new  medicine. Be especially cautious with aspirin and anti-inflammatory medicines. Ask your health care provider before taking these. Medicines such as antibiotics and acid-reducing medicine can interact with warfarin and can cause an increased warfarin effect. Warfarin can also interfere with the effectiveness of medicines you are taking. Do not take or discontinue any prescribed or over-the-counter medicine except on the advice of your health care provider or pharmacist.  Some vitamins, supplements, and herbal products interfere with the  effectiveness of warfarin. Vitamin E may increase the anticoagulant effects of warfarin. Vitamin K may can cause warfarin to be less effective. Do not take or discontinue any vitamin, supplement, or herbal product except on the advice of your health care provider or pharmacist.  Eat what you normally eat and keep the vitamin K content of your diet consistent. Avoid major changes in your diet, or notify your health care provider before changing your diet. Suddenly getting a lot more vitamin K could cause your blood to clot too quickly. A sudden decrease in vitamin K intake could cause your blood to clot too slowly. These changes in vitamin K intake could lead to dangerous blood clotsor to bleeding. To keep your vitamin K intake consistent, you must be aware of which foods contain moderate or high amounts of vitamin K. Some foods high in vitamin K include spinach, kale, broccoli, cabbage, greens, Brussels sprouts, asparagus, [place]oy, coleslaw, parsley, and green tea. Arrange a visit with a dietitian to answer your questions.  If you have a loss of appetite or get the stomach flu (viral gastroenteritis), talk to your health care provider as soon as possible. A decrease in your normal vitamin K intake can make you more sensitive to your usual dose of warfarin.  Some medical conditions may increase your risk for bleeding while you are taking warfarin. A fever, diarrhea lasting more than a day, worsening heart failure, or worsening liver function are some medical conditions that could affect warfarin. Contact your health care provider if you have any of these medical conditions.  Alcohol can change the body's ability to handle warfarin. It is best to avoid alcoholic drinks or consume only very small amounts while taking warfarin. Notify your health care provider if you change your alcohol intake. A sudden increase in alcohol use can increase your risk of bleeding. Chronic alcohol use can cause warfarin to be  less effective.  Be careful not to cut yourself when using sharp objects or while shaving.  Inform all your health care providers and your dentist that you take an anticoagulant.  Limit physical activities or sports that could result in a fall or cause injury. Avoid contact sports.  Wear medical alert jewelry or carry a medical alert card. SEEK IMMEDIATE MEDICAL CARE IF:  You cough up blood.  You have dark or black stools or there is bright red blood coming from your rectum.  You vomit blood or have nausea for more than 1 day.  You have blood in the urine or pink colored urine.  You have unusual bruising or have increased bruising.  You have bleeding from the nose or gums that does not stop quickly.  You have a cut that does not stop bleeding within a 2-3 minutes.  You have sudden weakness or numbness of the face, arm, or leg, especially on one side of the body.  You have sudden confusion.  You have trouble speaking (aphasia) or understanding.  You have sudden trouble seeing in one or both eyes.  You have  sudden trouble walking.  You have dizziness.  You have a loss of balance or coordination.  You have a sudden, severe headache.  You have a serious fall or head injury, even if you are not bleeding.  You have swelling or pain at an injection site.  You have unexplained tenderness or pain in the abdomen, back, waist, thighs or buttocks.  You have a fever. Any of these symptoms may represent a serious problem that is an emergency. Do not wait to see if the symptoms will go away. Get medical help right away. Call your local emergency services (911 in [place] Do not drive yourself to the hospital. Document Released: 07/30/2005 Document Revised: 08/04/2013 Document Reviewed: 03/03/2008 [facility] Patient Information 2015 [place], [place]. This information is not intended to replace advice given to you by your health care provider. Make sure you discuss any questions you have  with your health care provider.

## 2014-07-22 NOTE — Progress Notes (Signed)
Utilization review completed.  

## 2014-07-22 NOTE — Clinical Social Work Psychosocial (Signed)
Clinical Social Work Department BRIEF PSYCHOSOCIAL ASSESSMENT 07/22/2014  Patient:  Gregory May, Gregory May     Account Number:  [id number]     Admit date:  07/21/2014  Clinical Social Worker:  Delrae Sawyers  Date/Time:  07/22/2014 04:28 PM  Referred by:  Physician  Date Referred:  07/22/2014 Referred for  SNF Placement   Other Referral:   none.   Interview type:  Patient Other interview type:   none.    PSYCHOSOCIAL DATA Living Status:  FAMILY Admitted from facility:   Level of care:   Primary support name:  Gregory May Primary support relationship to patient:  FAMILY Degree of support available:   Adequate support.    CURRENT CONCERNS Current Concerns  Post-Acute Placement   Other Concerns:   none.    SOCIAL WORK ASSESSMENT / PLAN CSW received referral for possible SNF placement at time of discharge. CSW met with pt to discuss discharge disposition. Pt understanding of PT recommendation of SNF placement at time of discharge, but stated pt may be able to go home with home health once medically stable for discharge. CSW to pursue SNF placement and follow for possible SNF placement versus home with home health.   Assessment/plan status:  Psychosocial Support/Ongoing Assessment of Needs Other assessment/ plan:   none.   Information/referral to community resources:   [facility] bed offers.    PATIENT'S/FAMILY'S RESPONSE TO PLAN OF CARE: Pt understanding and agreeable to CSW plan of care. Pt expressed no further questions or concerns at this time.       Gregory May, [place] ([phone]) Licensed Clinical Social Worker Orthopedics [phone]) and Surgical [phone])

## 2014-07-22 NOTE — Progress Notes (Signed)
Physical Therapy Treatment Patient Details Name: Gregory May MRN: [id number] DOB: 1960/09/11 Today's Date: 07/22/2014    History of Present Illness 53 y.o. [facility] L direct anterior THA.  Pt with significant PMHx of DM, PE, DVT, Diabetic ulcer of R heel, R RTC surgery, slipped L capital femoral epiphisis, L hip pinning, and R THA.      PT Comments    Pt is POD #1 and is progressing well with his mobility.  Pt was min assist with RW today and was able to progress gait into the hallway.  Hip surgery exercises given and reviewed.  Pt continues to be appropriate for SNF level rehab at discharge.  PT will continue to follow acutely to progress mobility, gait, and exercises.  Pt would like to try his crutches next treatment session.   Follow Up Recommendations  SNF     Equipment Recommendations  Rolling walker with 5" wheels    Recommendations for Other Services   NA     Precautions / Restrictions Precautions Precautions: Fall Restrictions Weight Bearing Restrictions: Yes LLE Weight Bearing: Weight bearing as tolerated    Mobility  Bed Mobility Overal bed mobility: Needs Assistance Bed Mobility: Supine to Sit     Supine to sit: Min assist     General bed mobility comments: Min assist to help progress left leg over EOB. Verbal cues for 1/2 bridge technique.  Warm up exercises preformed to decreases left leg stiffness before getting out of bed.   Transfers Overall transfer level: Needs assistance Equipment used: Rolling walker (2 wheeled) Transfers: Sit to/from Stand Sit to Stand: Min assist         General transfer comment: Min assist to help support trunk during transitions.   Ambulation/Gait Ambulation/Gait assistance: Min assist Ambulation Distance (Feet): 40 Feet Assistive device: Rolling walker (2 wheeled) Gait Pattern/deviations: Step-to pattern;Antalgic;Trunk flexed Gait velocity: decreased Gait velocity  interpretation: Below normal speed for age/gender General Gait Details: Verbal cues for upright posture and to move hands back on RW grip pads.  Verbal cues for safe RW use.            Balance Overall balance assessment: Needs assistance Sitting-balance support: Feet supported;No upper extremity supported Sitting balance-Leahy Scale: Good     Standing balance support: Bilateral upper extremity supported Standing balance-Leahy Scale: Poor                      Cognition Arousal/Alertness: Awake/alert Behavior During Therapy: WFL for tasks assessed/performed Overall Cognitive Status: Within Functional Limits for tasks assessed                      Exercises Total Joint Exercises Ankle Circles/Pumps: AROM;Both;20 reps;Supine Quad Sets: AROM;Left;10 reps;Supine Heel Slides: AROM;Left;10 reps;Seated Long Arc Quad: AROM;Left;10 reps;Seated        Pertinent Vitals/Pain Pain Assessment: 0-10 Pain Score: 8  Pain Location: left hip and proximal thigh Pain Descriptors / Indicators: Aching;Sore Pain Intervention(s): Limited activity within patient's tolerance;Monitored during session;Repositioned;Ice applied           PT Goals (current goals can now be found in the care plan section) Acute Rehab PT Goals Patient Stated Goal: Walk without pain and without fear of leg buckling Progress towards PT goals: Progressing toward goals    Frequency  7X/week    PT Plan Current plan remains appropriate       End of Session Equipment Utilized During Treatment: Gait  belt Activity Tolerance: Patient limited by pain Patient left: in chair;with call bell/phone within reach     Time: [id number] PT Time Calculation (min) (ACUTE ONLY): 31 min  Charges:  $Gait Training: 8-22 mins $Therapeutic Exercise: 8-22 mins                      Janara Klett B. Tymier Lindholm, PT, DPT [phone]   07/22/2014, 9:44 AM

## 2014-07-22 NOTE — Progress Notes (Signed)
Subjective: 1 Day Post-Op Procedure(s) (LRB): TOTAL HIP ARTHROPLASTY ANTERIOR APPROACH (Left) HARDWARE REMOVAL (Left) Patient reports pain as 2 on 0-10 scale.  Patient reports mild lightheadedness/dizziness upon standing yesterday, none today.  No nausea/vomiting, lightheadedness/dizziness.  Negative flatus or bm.  Tolerating diet.  Objective: Vital signs in last 24 hours: Temp:  [97.5 F (36.4 C)-98.7 F (37.1 C)] 98.7 F (37.1 C) (12/10 0540) Pulse Rate:  [59-104] 84 (12/10 0540) Resp:  [11-16] 14 (12/10 0540) BP: (106-131)/(61-81) 111/61 mmHg (12/10 0540) SpO2:  [94 %-100 %] 94 % (12/10 0540) FiO2 (%):  [3 %] 3 % (12/09 1155) Weight:  [90.266 kg (199 lb)] 90.266 kg (199 lb) (12/09 0647)  Intake/Output from previous day: 12/09 0701 - 12/10 0700 In: 1830 [P.O.:480; I.V.:1350] Out: 1250 [Urine:900; Blood:350] Intake/Output this shift: Total I/O In: -  Out: 750 [Urine:750]   Recent Labs  07/22/14 0510  HGB 11.0*    Recent Labs  07/22/14 0510  WBC 12.1*  RBC 3.51*  HCT 33.7*  PLT 241   No results for input(s): NA, K, CL, CO2, BUN, CREATININE, GLUCOSE, CALCIUM in the last 72 hours. No results for input(s): LABPT, INR in the last 72 hours.  Neurologically intact Neurovascular intact Sensation intact distally Intact pulses distally Dorsiflexion/Plantar flexion intact Compartment soft  Negative homans bilaterally  Assessment/Plan: 1 Day Post-Op Procedure(s) (LRB): TOTAL HIP ARTHROPLASTY ANTERIOR APPROACH (Left) HARDWARE REMOVAL (Left) Advance diet Up with therapy D/C IV fluids Discharge to SNF today or tomorrow  WBAT LLE- DA hip precautions  ANTON, M. LINDSEY 07/22/2014, 6:04 AM

## 2014-07-22 NOTE — Progress Notes (Signed)
Occupational Therapy Evaluation Patient Details Name: Gregory May MRN: [id number] DOB: [birth year] Today's Date: 07/22/2014    History of Present Illness 53 y.o. male [facility] for elective L direct anterior THA.  Pt with significant PMHx of DM, PE, DVT, Diabetic ulcer of R heel, R RTC surgery, slipped L capital femoral epiphisis, L hip pinning, and R THA.     Clinical Impression   Patient tolerated OT evaluation well. OT will follow to address self-care, functional mobility, AE/DME instruction, patient/caregiver education. Recommend SNF rehab at discharge.    Follow Up Recommendations  SNF;Supervision/Assistance - 24 hour    Equipment Recommendations  None recommended by OT (has all needed equipment)    Recommendations for Other Services PT consult     Precautions / Restrictions Precautions Precautions: Fall Restrictions Weight Bearing Restrictions: Yes LLE Weight Bearing: Weight bearing as tolerated Other Position/Activity Restrictions: direct anterior hip precautions      Mobility Bed Mobility Overal bed mobility: Needs Assistance Bed Mobility: Supine to Sit     Supine to sit: Min assist     General bed mobility comments: Min assist to help progress left leg over EOB. Verbal cues for 1/2 bridge technique.  Warm up exercises preformed to decreases left leg stiffness before getting out of bed.   Transfers Overall transfer level: Needs assistance Equipment used: Rolling walker (2 wheeled) Transfers: Sit to/from Stand Sit to Stand: Min guard         General transfer comment: Min assist to help support trunk during transitions.     Balance Overall balance assessment: Needs assistance Sitting-balance support: Feet supported;No upper extremity supported Sitting balance-Leahy Scale: Good     Standing balance support: Bilateral upper extremity supported Standing balance-Leahy Scale: Poor                              ADL  Overall ADL's : Needs assistance/impaired Eating/Feeding: Independent   Grooming: Wash/dry hands;Wash/dry face;Set up;Sitting   Upper Body Bathing: Set up   Lower Body Bathing: Moderate assistance   Upper Body Dressing : Set up   Lower Body Dressing: Moderate assistance   Toilet Transfer: Min guard   Toileting- Clothing Manipulation and Hygiene: Min guard       Functional mobility during ADLs: Min guard;Rolling walker General ADL Comments: Patient reports he has BSC at home that his brother will take to his aunt's house. He also reports he has a reacher and sock aid that he has been using for LB dressing. He denies need to review how to use. He was already up in the chair and we focused on toileting activity. Patient moves slowly with RW but able to ambulate to and from bathroom with RW.     Vision                     Perception     Praxis      Pertinent Vitals/Pain Pain Assessment: 0-10 Pain Score: 8  Pain Location: left hip and thigh Pain Descriptors / Indicators: Aching;Sore Pain Intervention(s): Limited activity within patient's tolerance;Monitored during session;Other (comment) (Patient reports patient just had pain medication)     Hand Dominance Right   Extremity/Trunk Assessment Upper Extremity Assessment Upper Extremity Assessment: Overall WFL for tasks assessed   Lower Extremity Assessment Lower Extremity Assessment: Defer to PT evaluation       Communication Communication Communication: No difficulties   Cognition Arousal/Alertness: Awake/alert Behavior  During Therapy: WFL for tasks assessed/performed Overall Cognitive Status: Within Functional Limits for tasks assessed                     General Comments       Exercises Exercises: Total Joint     Shoulder Instructions      Home Living Family/patient expects to be discharged to:: Skilled nursing facility Living Arrangements: Alone                                Additional Comments: Pt states his aunt can assist but works and 24/7 assist is not available      Prior Functioning/Environment Level of Independence: Independent with assistive device(s)        Comments: crutches, reacher, sock aid, BSC    OT Diagnosis: Acute pain   OT Problem List: Decreased strength;Decreased range of motion;Decreased activity tolerance;Decreased knowledge of use of DME or AE;Decreased knowledge of precautions;Pain   OT Treatment/Interventions: Self-care/ADL training;DME and/or AE instruction;Therapeutic activities;Patient/family education    OT Goals(Current goals can be found in the care plan section) Acute Rehab OT Goals Patient Stated Goal: Walk without pain and without fear of leg buckling OT Goal Formulation: With patient Time For Goal Achievement: 07/29/14 Potential to Achieve Goals: Good  OT Frequency: Min 2X/week   Barriers to D/C: Decreased caregiver support  no 24/7 A available, aunt works from 10-3.       Co-evaluation              End of Session Equipment Utilized During Treatment: Rolling walker  Activity Tolerance: Patient tolerated treatment well Patient left: in chair;with call bell/phone within reach   Time: [id number] OT Time Calculation (min): 29 min Charges:  OT General Charges $OT Visit: 1 Procedure OT Evaluation $Initial OT Evaluation Tier I: 1 Procedure OT Treatments $Self Care/Home Management : 8-22 mins $Therapeutic Activity: 8-22 mins G-Codes:    Gregory May A 07/22/2014, 11:25 AM

## 2014-07-22 NOTE — Op Note (Signed)
NAMVicente Masson:  Angerer, Kelen           ACCOUNT NO.:  [id number]  MEDICAL RECORD NO.:  [id number]  LOCATION:  [id number]                        FACILITY:  [facility]  PHYSICIAN:  Loreta Aveaniel F. Murphy, M.D. DATE OF BIRTH:  September 09, 1960  DATE OF PROCEDURE:  07/21/2014 DATE OF DISCHARGE:                              OPERATIVE REPORT   PREOPERATIVE DIAGNOSES:  Left hip end-stage secondary generalized osteoarthritis.  Previous pinning of slipped capital femoral epiphysis. Three retained Knowles pins.  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  POSTOPERATIVE DIAGNOSES:  Left hip end-stage secondary generalized osteoarthritis. Previous pinning of slipped capital femoral epiphysis. Three retained nose pins.  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  PROCEDURE:  Conversion left hip from previous surgery to a total hip replacement direct anterior approach Stryker prosthesis.  Press-fit 58- mm acetabular component screw fixation x2, 36-mm internal diameter liner.  A Press-fit #6 Accolade stem 127-degree neck angle with a 36+ 0 Biolox head.  Removal of Knowles pins x3.  SURGEON:  Loreta Aveaniel F. Murphy, MD  ASSISTANT:  Odelia GageLindsey Anton, PA present throughout the entire case and necessary for timely completion of procedure.  ANESTHESIA:  General.  BLOOD LOSS:  400 mL.  BLOOD GIVEN:  None.  SPECIMENS:  None.  CULTURES:  None.  COMPLICATIONS:  None.  DRESSINGS:  Soft compressive.  DESCRIPTION OF PROCEDURE:  The patient was brought to the operating room, placed on the operating table in a supine position.  After adequate anesthesia had been obtained, he was positioned, prepped and draped for direct anterior approach.  I used the distal incision extending it proximally and slightly anteriorly.  Skin and subcutaneous tissue divided.  The fascia over the tensor was incised.  This was brought more distal than usual to access the lateral aspect of the femur.  Lateral side of the  femur was exposed.  I was able to find three Knowles pins and with hand tools and power I was able to extract all of these without too much difficulty.  I then went more proximally.  Blunt dissection into exposing the anterior capsular hip.  Anterior capsule excised in its entirety.  Femoral neck was cut 1 fingerbreadth below the lesser trochanter.  A napkin ring cut above that.  That was removed and then the head removed.  Acetabulum exposed.  A lot of debris cleared throughout.  Acetabulum exposed, brought up to good bleeding bone, and then fitted with a 58-mm cup at 40 degrees of abduction, slight anteversion.  Good capturing and fixation augmented with 2 screws through the cup.  A 36-mm internal diameter liner.  I then freed up the back of the femur to expose this.  Broaches and rasps were then used to bring it up for appropriate sized for a #6 component.  After trials, the #6 component was seated.  This was then attached to the 36+ 0 Biolox head.  Hip was reduced.  Excellent stability, full motion, equal leg lengths.  Wound copiously irrigated.  Soft tissues injected with Exparel.  The fascia over the tensor closed with Vicryl.  Subcutaneous and subcuticular closure and also staples at the margin because of the previous wound.  Margins were injected with Marcaine.  Sterile  compressive dressing applied.  Anesthesia reversed.  Brought to the recovery room.  Tolerated the surgery well.  No complications.     Loreta Aveaniel F. Murphy, M.D.     DFM/MEDQ  D:  07/21/2014  T:  07/22/2014  Job:  [id number]

## 2014-07-22 NOTE — Clinical Social Work Placement (Signed)
Clinical Social Work Department CLINICAL SOCIAL WORK PLACEMENT NOTE 07/22/2014  Patient:  Gregory May,Gregory May  Account Number:  [id number] Admit date:  07/21/2014  Clinical Social Worker:  Mosie EpsteinEMILY S Audley Hinojos, LCSWA  Date/time:  07/22/2014 04:32 PM  Clinical Social Work is seeking post-discharge placement for this patient at the following level of care:   SKILLED NURSING   (*CSW will update this form in Epic as items are completed)   07/22/2014  Patient/family provided with [facility] Department of Clinical Social Work's list of facilities offering this level of care within the geographic area requested by the patient (or if unable, by the patient's family).  07/22/2014  Patient/family informed of their freedom to choose among providers that offer the needed level of care, that participate in Medicare, Medicaid or managed care program needed by the patient, have an available bed and are willing to accept the patient.  07/22/2014  Patient/family informed of [facility]' ownership interest in [facility], as well as of the fact that they are under no obligation to receive care at this facility.  PASARR submitted to EDS on 07/22/2014 PASARR number received on 07/22/2014  FL2 transmitted to all facilities in geographic area requested by pt/family on  07/22/2014 FL2 transmitted to all facilities within larger geographic area on   Patient informed that his/her managed care company has contracts with or will negotiate with  certain facilities, including the following:     Patient/family informed of bed offers received:  07/22/2014 Patient chooses bed at  Physician recommends and patient chooses bed at    Patient to be transferred to  on   Patient to be transferred to facility by  Patient and family notified of transfer on  Name of family member notified:    The following physician request were entered in Epic:   Additional Comments:  Gregory May, LCSWA [phone]) Licensed  Clinical Social Worker Orthopedics [phone]([id number]) and Surgical [phone]([id number])

## 2014-07-22 NOTE — Telephone Encounter (Signed)
Thanks

## 2014-07-23 ENCOUNTER — Other Ambulatory Visit: Payer: Managed Care, Other (non HMO)

## 2014-07-23 ENCOUNTER — Encounter (HOSPITAL_COMMUNITY): Payer: Self-pay | Admitting: Orthopedic Surgery

## 2014-07-23 LAB — CBC
HCT: 32 % — ABNORMAL LOW (ref 39.0–52.0)
Hemoglobin: 10.4 g/dL — ABNORMAL LOW (ref 13.0–17.0)
MCH: 31.2 pg (ref 26.0–34.0)
MCHC: 32.5 g/dL (ref 30.0–36.0)
MCV: 96.1 fL (ref 78.0–100.0)
Platelets: 226 10*3/uL (ref 150–400)
RBC: 3.33 MIL/uL — ABNORMAL LOW (ref 4.22–5.81)
RDW: 13.7 % (ref 11.5–15.5)
WBC: 9.5 10*3/uL (ref 4.0–10.5)

## 2014-07-23 LAB — GLUCOSE, CAPILLARY
Glucose-Capillary: 160 mg/dL — ABNORMAL HIGH (ref 70–99)
Glucose-Capillary: 195 mg/dL — ABNORMAL HIGH (ref 70–99)
Glucose-Capillary: 183 mg/dL — ABNORMAL HIGH (ref 70–99)
Glucose-Capillary: 232 mg/dL — ABNORMAL HIGH (ref 70–99)

## 2014-07-23 LAB — BASIC METABOLIC PANEL
Anion gap: 12 (ref 5–15)
BUN: 10 mg/dL (ref 6–23)
CO2: 24 mEq/L (ref 19–32)
Calcium: 8.5 mg/dL (ref 8.4–10.5)
Chloride: 102 mEq/L (ref 96–112)
Creatinine, Ser: 0.72 mg/dL (ref 0.50–1.35)
GFR calc Af Amer: >90 mL/min (ref >90)
GFR calc non Af Amer: >90 mL/min (ref >90)
Glucose, Bld: 144 mg/dL — ABNORMAL HIGH (ref 70–99)
Potassium: 4.2 mEq/L (ref 3.7–5.3)
Sodium: 138 mEq/L (ref 137–147)

## 2014-07-23 NOTE — Progress Notes (Signed)
Physical Therapy Treatment Patient Details Name: Gregory May MRN: [id number] DOB: [birth year] Today's Date: 07/23/2014    History of Present Illness 53 y.o. male [facility] for elective L direct anterior THA.  Pt with significant PMHx of DM, PE, DVT, Diabetic ulcer of R heel, R RTC surgery, slipped L capital femoral epiphisis, L hip pinning, and R THA.      PT Comments    Pt making great progress with mobility.  Used his own crutches initially, however noted them to be too short, therefore provided him with a pair of our crutches and will recommend a new pair at time of D/C.  Note plan has changed to home therefore will need HHPT.    Follow Up Recommendations  Home health PT     Equipment Recommendations  Crutches    Recommendations for Other Services       Precautions / Restrictions Precautions Precautions: Fall Restrictions Weight Bearing Restrictions: Yes LLE Weight Bearing: Weight bearing as tolerated Other Position/Activity Restrictions: direct anterior hip precautions    Mobility  Bed Mobility Overal bed mobility:  (Pt in recliner when PT arrived. )                Transfers Overall transfer level: Needs assistance Equipment used: Crutches Transfers: Sit to/from Stand Sit to Stand: Supervision         General transfer comment: S for safety with cues for hand placement and safety.   Ambulation/Gait Ambulation/Gait assistance: Supervision Ambulation Distance (Feet): 80 Feet Assistive device: Crutches Gait Pattern/deviations: Step-to pattern;Decreased stride length;Antalgic;Trunk flexed     General Gait Details: Pt performs 4 point gait pattern, but does very well with cues for upright posture and not propping on crutches to prevent brachial plexus injury.    Stairs Stairs: Yes Stairs assistance: Min assist Stair Management: No rails;Step to pattern;Forwards;With crutches Number of Stairs: 5 General stair comments: Pt did  very well with stairs with use of crutches.  Replaced his crutches with hospital crutches due to not correct height.  Cues for sequencing and technique.   Wheelchair Mobility    Modified Rankin (Stroke Patients Only)       Balance                                    Cognition Arousal/Alertness: Awake/alert Behavior During Therapy: WFL for tasks assessed/performed Overall Cognitive Status: Within Functional Limits for tasks assessed                      Exercises      General Comments        Pertinent Vitals/Pain Pain Score: 5  Pain Location: L hip Pain Descriptors / Indicators: Aching Pain Intervention(s): Repositioned    Home Living                      Prior Function            PT Goals (current goals can now be found in the care plan section) Acute Rehab PT Goals Patient Stated Goal: Walk without pain and without fear of leg buckling PT Goal Formulation: With patient Time For Goal Achievement: 07/28/14 Potential to Achieve Goals: Good Progress towards PT goals: Progressing toward goals    Frequency  7X/week    PT Plan Discharge plan needs to be updated    Co-evaluation  End of Session   Activity Tolerance: Patient limited by pain Patient left: in chair;with call bell/phone within reach     Time: 1133-1202 PT Time Calculation (min) (ACUTE ONLY): 29 min  Charges:  $Gait Training: 23-37 mins                    G Codes:      Vista Deckarcell, Alexius Ellington Ann 07/23/2014, 12:13 PM

## 2014-07-23 NOTE — Progress Notes (Signed)
Subjective: 2 Days Post-Op Procedure(s) (LRB): TOTAL HIP ARTHROPLASTY ANTERIOR APPROACH (Left) HARDWARE REMOVAL (Left) Patient reports pain as 3 on 0-10 scale.  No nausea/vomiting, lightheadedness/dizziness.  Negative flatusbm.  Tolerating diet.  Patient expressed to me this am that he will most likely have someone at home to take care of him.  He is going to double check this am and let SS know.  If this is the case he would like to go home with hhpt rather than SNF.  Objective: Vital signs in last 24 hours: Temp:  [97.8 F (36.6 C)-99.5 F (37.5 C)] 99.5 F (37.5 C) (12/11 0504) Pulse Rate:  [86-108] 88 (12/11 0504) Resp:  [1-18] 16 (12/11 0504) BP: (116-117)/(59-60) 117/59 mmHg (12/11 0504) SpO2:  [95 %-99 %] 96 % (12/11 0504)  Intake/Output from previous day: 12/10 0701 - 12/11 0700 In: 900 [P.O.:900] Out: 2050 [Urine:2050] Intake/Output this shift:     Recent Labs  07/22/14 0510 07/23/14 0500  HGB 11.0* 10.4*    Recent Labs  07/22/14 0510 07/23/14 0500  WBC 12.1* 9.5  RBC 3.51* 3.33*  HCT 33.7* 32.0*  PLT 241 226    Recent Labs  07/22/14 0510 07/23/14 0500  NA 133* 138  K 4.3 4.2  CL 97 102  CO2 25 24  BUN 15 10  CREATININE 0.82 0.72  GLUCOSE 191* 144*  CALCIUM 8.6 8.5   No results for input(s): LABPT, INR in the last 72 hours.  Neurologically intact Neurovascular intact Sensation intact distally Intact pulses distally Dorsiflexion/Plantar flexion intact Incision: moderate drainage No cellulitis present Compartment soft  Negative homans bilaterally Dressing changed by me today  Assessment/Plan: 2 Days Post-Op Procedure(s) (LRB): TOTAL HIP ARTHROPLASTY ANTERIOR APPROACH (Left) HARDWARE REMOVAL (Left) Advance diet Up with therapy Discharge to SNF vs. Home with HHPT (see above) WBAT LLE- DA hip precautions  ANTON, M. LINDSEY 07/23/2014, 7:20 AM

## 2014-07-23 NOTE — Progress Notes (Signed)
Physical Therapy Treatment Patient Details Name: Gregory May MRN: [id number] DOB: [birth year] Today's Date: 07/23/2014    History of Present Illness 53 y.o. [facility] for elective L direct anterior THA.  Pt with significant PMHx of DM, PE, DVT, Diabetic ulcer of R heel, R RTC surgery, slipped L capital femoral epiphisis, L hip pinning, and R THA.      PT Comments    Pt very motivated to progress with therapy. Reviewed stair management technique with pt. Pt at supervision level for stair mobilization. Pt hopeful to D/C home after 2nd session tomorrow in the afternoon.   Follow Up Recommendations  Home health PT     Equipment Recommendations  Crutches    Recommendations for Other Services       Precautions / Restrictions Precautions Precautions: None Restrictions Weight Bearing Restrictions: Yes LLE Weight Bearing: Weight bearing as tolerated Other Position/Activity Restrictions: direct anterior hip precautions    Mobility  Bed Mobility               General bed mobility comments: pt up in chair and returned to chair   Transfers Overall transfer level: Needs assistance Equipment used: Crutches Transfers: Sit to/from Stand Sit to Stand: Supervision         General transfer comment: pt demo good technique with sit to stand; S for min cues for safety  Ambulation/Gait Ambulation/Gait assistance: Supervision Ambulation Distance (Feet): 100 Feet Assistive device: Crutches Gait Pattern/deviations: Decreased stance time - left;Decreased step length - right;Antalgic Gait velocity: decreased Gait velocity interpretation: Below normal speed for age/gender General Gait Details: pt ambulating with 4 point gt pattern; cues for upright posture and reinforced safe technique with crutches to prevent brachial plexus injury    Stairs Stairs: Yes Stairs assistance: Min guard Stair Management: No rails;Step to pattern;Forwards;With  crutches Number of Stairs: 5 General stair comments: pt demo good recall of technique; supervision for safety   Wheelchair Mobility    Modified Rankin (Stroke Patients Only)       Balance Overall balance assessment: Needs assistance Sitting-balance support: Feet supported;No upper extremity supported Sitting balance-Leahy Scale: Good     Standing balance support: During functional activity;Bilateral upper extremity supported Standing balance-Leahy Scale: Poor Standing balance comment: relies on crutches for bil UE support to balance                     Cognition Arousal/Alertness: Awake/alert Behavior During Therapy: WFL for tasks assessed/performed Overall Cognitive Status: Within Functional Limits for tasks assessed                      Exercises Total Joint Exercises Long Arc Quad: AROM;Left;10 reps;Seated (encouraged hold for 5 sec count) Knee Flexion: AROM;Strengthening;Left;5 reps;Standing (with UE support)    General Comments General comments (skin integrity, edema, etc.): reviewed HEP with pt      Pertinent Vitals/Pain Pain Assessment: 0-10 Pain Score: 6  Pain Location: Lt hip  Pain Descriptors / Indicators: Sore Pain Intervention(s): Monitored during session;Premedicated before session;Repositioned    Home Living                      Prior Function            PT Goals (current goals can now be found in the care plan section) Acute Rehab PT Goals Patient Stated Goal: home tomorrow night or sunday PT Goal Formulation: With patient Time For Goal Achievement: 07/28/14 Potential  to Achieve Goals: Good Progress towards PT goals: Progressing toward goals    Frequency  7X/week    PT Plan Current plan remains appropriate    Co-evaluation             End of Session   Activity Tolerance: Patient tolerated treatment well Patient left: in chair;with call bell/phone within reach;with family/visitor present     Time:  1610-96041540-1558 PT Time Calculation (min) (ACUTE ONLY): 18 min  Charges:  $Gait Training: 8-22 mins                    G CodesDonnamarie Poag:      Ellsie Violette [place], [place]  [phone] 07/23/2014, 4:14 PM

## 2014-07-24 LAB — CBC
HCT: 29.3 % — ABNORMAL LOW (ref 39.0–52.0)
Hemoglobin: 9.7 g/dL — ABNORMAL LOW (ref 13.0–17.0)
MCH: 31.7 pg (ref 26.0–34.0)
MCHC: 33.1 g/dL (ref 30.0–36.0)
MCV: 95.8 fL (ref 78.0–100.0)
Platelets: 227 10*3/uL (ref 150–400)
RBC: 3.06 MIL/uL — ABNORMAL LOW (ref 4.22–5.81)
RDW: 13.4 % (ref 11.5–15.5)
WBC: 8.6 10*3/uL (ref 4.0–10.5)

## 2014-07-24 LAB — GLUCOSE, CAPILLARY
Glucose-Capillary: 150 mg/dL — ABNORMAL HIGH (ref 70–99)
Glucose-Capillary: 192 mg/dL — ABNORMAL HIGH (ref 70–99)
Glucose-Capillary: 182 mg/dL — ABNORMAL HIGH (ref 70–99)

## 2014-07-24 LAB — BASIC METABOLIC PANEL
Anion gap: 11 (ref 5–15)
BUN: 11 mg/dL (ref 6–23)
CO2: 25 mEq/L (ref 19–32)
Calcium: 8.9 mg/dL (ref 8.4–10.5)
Chloride: 101 mEq/L (ref 96–112)
Creatinine, Ser: 0.73 mg/dL (ref 0.50–1.35)
GFR calc Af Amer: >90 mL/min (ref >90)
GFR calc non Af Amer: >90 mL/min (ref >90)
Glucose, Bld: 147 mg/dL — ABNORMAL HIGH (ref 70–99)
Potassium: 4.5 mEq/L (ref 3.7–5.3)
Sodium: 137 mEq/L (ref 137–147)

## 2014-07-24 NOTE — Progress Notes (Signed)
Physical Therapy Treatment Patient Details Name: Gregory May MRN: [id number] DOB: Nov 20, 1960 Today's Date: 07/24/2014    History of Present Illness 53 y.o. male [facility] for elective L direct anterior THA.  Pt with significant PMHx of DM, PE, DVT, Diabetic ulcer of R heel, R RTC surgery, slipped L capital femoral epiphisis, L hip pinning, and R THA.      PT Comments    Pt was seen for final visit unless further incident happens. Pt is very motivated to get home and demonstrates excellent follow through on his mobility skills.  Has a plan for HHPT and will be using crutches as planned due to stairs not having a rail to enter house.  Follow Up Recommendations  Home health PT     Equipment Recommendations  Crutches    Recommendations for Other Services       Precautions / Restrictions Precautions Precautions: Anterior Hip Precaution Comments: instructed pt not to do a deep lunge that might stress front of hip joint Restrictions Weight Bearing Restrictions: Yes LLE Weight Bearing: Weight bearing as tolerated Other Position/Activity Restrictions: direct anterior hip precautions    Mobility  Bed Mobility               General bed mobility comments: up when PT arrived  Transfers Overall transfer level: Needs assistance Equipment used: Crutches Transfers: Sit to/from [employer] CorporationStand;Stand Pivot Transfers Sit to Stand: Supervision Stand pivot transfers: Supervision       General transfer comment: pt could demonstrate the sequence for crutches  Ambulation/Gait Ambulation/Gait assistance: Supervision Ambulation Distance (Feet): 140 Feet Assistive device: Crutches Gait Pattern/deviations: Step-through pattern;Decreased stance time - left;Decreased stride length;Decreased weight shift to left;Trunk flexed;Wide base of support Gait velocity: decreased Gait velocity interpretation: Below normal speed for age/gender General Gait Details: modified 4 point  gait pattern with very stable appearance.  Cued posture and sequence but is very aware of his mobility    Stairs Stairs: Yes Stairs assistance: Min guard Stair Management: No rails;With crutches;Step to pattern;Forwards Number of Stairs: 10 General stair comments: excellent follow through by pt  Wheelchair Mobility    Modified Rankin (Stroke Patients Only)       Balance Overall balance assessment: Needs assistance Sitting-balance support: Feet supported Sitting balance-Leahy Scale: Good   Postural control: Posterior lean Standing balance support: Bilateral upper extremity supported Standing balance-Leahy Scale: Fair Standing balance comment: reminded pt not to use crutch pads for support under axillae                    Cognition Arousal/Alertness: Awake/alert Behavior During Therapy: WFL for tasks assessed/performed Overall Cognitive Status: Within Functional Limits for tasks assessed                      Exercises      General Comments General comments (skin integrity, edema, etc.): Pt is demonstrating excellent follow through on stairs, on technque and good problem solving with PT for car transfer using mat table in gym      Pertinent Vitals/Pain Pain Assessment: 0-10 Pain Score: 7  Pain Location: front of L hip Pain Descriptors / Indicators: Cramping Pain Intervention(s): Limited activity within patient's tolerance;Monitored during session;Premedicated before session;Repositioned;Other (comment) (movement was relieving by end of walk)    Home Living                      Prior Function  PT Goals (current goals can now be found in the care plan section) Acute Rehab PT Goals Patient Stated Goal: to get home safely Progress towards PT goals: Progressing toward goals    Frequency  7X/week    PT Plan Current plan remains appropriate    Co-evaluation             End of Session Equipment Utilized During Treatment:  Gait belt Activity Tolerance: Patient tolerated treatment well Patient left: in chair;with call bell/phone within reach     Time: 1302-1328 PT Time Calculation (min) (ACUTE ONLY): 26 min  Charges:  $Gait Training: 8-22 mins $Therapeutic Activity: 8-22 mins                    G Codes:      Ivar DrapeStout, Casandra Dallaire E 07/24/2014, 2:47 PM   Samul Dadauth Jackye Dever, PT MS Acute Rehab Dept. Number: [phone]

## 2014-07-24 NOTE — Progress Notes (Signed)
Occupational Therapy Treatment Patient Details Name: Gregory May MRN: [id number] DOB: April 15, 1961 Today's Date: 07/24/2014    History of present illness 53 [facility].o. [facility] for elective L direct anterior THA.  Pt with significant PMHx of DM, PE, DVT, Diabetic ulcer of R heel, R RTC surgery, slipped L capital femoral epiphisis, L hip pinning, and R THA.     OT comments  Pt seen today for ADL session and participated in bathing and dressing with min (A) for LLE and Supervision for safety. Pt plans to d/c home today and is safe for d/c from OT standpoint. All education and training completed.    Follow Up Recommendations  No OT follow up;Supervision/Assistance - 24 hour    Equipment Recommendations  None recommended by OT    Recommendations for Other Services      Precautions / Restrictions Precautions Precautions: None Precaution Comments: instructed pt not to do a deep lunge that might stress front of hip joint Restrictions Weight Bearing Restrictions: Yes LLE Weight Bearing: Weight bearing as tolerated Other Position/Activity Restrictions: direct anterior hip       Mobility Bed Mobility               General bed mobility comments: Pt sitting in bathroom on BSC when OT arrived  Transfers Overall transfer level: Needs assistance Equipment used: Crutches Transfers: Sit to/from Stand Sit to Stand: Supervision Stand pivot transfers: Supervision       General transfer comment: Supervision for safety.     Balance Overall balance assessment: Needs assistance Sitting-balance support: Feet supported Sitting balance-Leahy Scale: Good   Postural control: Posterior lean Standing balance support: Bilateral upper extremity supported Standing balance-Leahy Scale: Fair Standing balance comment: reminded pt not to use crutch pads for support under axillae                   ADL Overall ADL's : Needs assistance/impaired     Grooming:  Supervision/safety;Standing   Upper Body Bathing: Set up;Sitting   Lower Body Bathing: Minimal assistance;Sit to/from stand   Upper Body Dressing : Set up;Sitting   Lower Body Dressing: Sit to/from stand;Moderate assistance Lower Body Dressing Details (indicate cue type and reason): pt required assistance with threading LLE and donning L shoe/sock Toilet Transfer: Supervision/safety;Ambulation;RW           Functional mobility during ADLs: Supervision/safety;Rolling walker General ADL Comments: Pt now plans d/c to home. He participated in ADL session to prepare for d/c home using compensatory techniques.                 Cognition  Arousal/Alertness: Awake/Alert Behavior During Therapy: WFL for tasks assessed/performed Overall Cognitive Status: Within Functional Limits for tasks assessed                                    Pertinent Vitals/ Pain       Pain Assessment: 0-10 Pain Score: 3  Pain Location: L hip Pain Descriptors / Indicators: Sore Pain Intervention(s): Monitored during session;Repositioned         Frequency Min 2X/week     Progress Toward Goals  OT Goals(current goals can now be found in the care plan section)  Progress towards OT goals: Progressing toward goals  Acute Rehab OT Goals Patient Stated Goal: to get home safely  Plan Discharge plan needs to be updated       End of  Session Equipment Utilized During Treatment: Other (comment) (crutches)   Activity Tolerance Patient tolerated treatment well   Patient Left in chair;with call bell/phone within reach   Nurse Communication Other (comment) (pt dressed for d/c)        Time: 8119-14781344-1429 OT Time Calculation (min): 45 min  Charges: OT General Charges $OT Visit: 1 Procedure OT Treatments $Self Care/Home Management : 38-52 mins  Rae LipsMiller, Tehillah Cipriani M 07/24/2014, 3:27 PM  Carney LivingLeeAnn Marie Lemoyne Nestor, OTR/L Occupational Therapist [phone] (pager)

## 2014-07-24 NOTE — Progress Notes (Signed)
Subjective: 3 Days Post-Op Procedure(s) (LRB): TOTAL HIP ARTHROPLASTY ANTERIOR APPROACH (Left) HARDWARE REMOVAL (Left) Patient reports pain as 2 on 0-10 scale.  No nausea/vomiting, lightheadedness/dizziness, chest pain or sob.  Positive flatus but no bm.  Tolerating diet.  Eager to work with PT twice today before d/c to home.  Objective: Vital signs in last 24 hours: Temp:  [97.3 F (36.3 C)-98.4 F (36.9 C)] 98.2 F (36.8 C) (12/12 0518) Pulse Rate:  [86-91] 91 (12/12 0518) Resp:  [16-20] 20 (12/12 0518) BP: (109-111)/(62-76) 110/67 mmHg (12/12 0518) SpO2:  [93 %-97 %] 93 % (12/12 0518)  Intake/Output from previous day: 12/11 0701 - 12/12 0700 In: 1260 [P.O.:1260] Out: 2850 [Urine:2850] Intake/Output this shift:     Recent Labs  07/22/14 0510 07/23/14 0500 07/24/14 0513  HGB 11.0* 10.4* 9.7*    Recent Labs  07/23/14 0500 07/24/14 0513  WBC 9.5 8.6  RBC 3.33* 3.06*  HCT 32.0* 29.3*  PLT 226 227    Recent Labs  07/23/14 0500 07/24/14 0513  NA 138 137  K 4.2 4.5  CL 102 101  CO2 24 25  BUN 10 11  CREATININE 0.72 0.73  GLUCOSE 144* 147*  CALCIUM 8.5 8.9   No results for input(s): LABPT, INR in the last 72 hours.  Neurologically intact Neurovascular intact Sensation intact distally Intact pulses distally Dorsiflexion/Plantar flexion intact Compartment soft  Negative homans bilaterally   Assessment/Plan: 3 Days Post-Op Procedure(s) (LRB): TOTAL HIP ARTHROPLASTY ANTERIOR APPROACH (Left) HARDWARE REMOVAL (Left) Advance diet Up with therapy Discharge home with home health  WBAT LLE: DA hip precautions ABLA-stable.  Will follow Patient may shower with wound covered.  Please change bandage following shower.  Gearldine ShownNTON, M. LINDSEY 07/24/2014, 8:19 AM

## 2014-07-24 NOTE — Progress Notes (Signed)
Orthopedic Tech Progress Note Patient Details:  Gregory May [birth year] [id number]  Patient ID: Gregory May, male   DOB: [birth year], 53 y.o.   MRN: [id number] Viewed order from doctor's order list  Nikki DomCrawford, Jayr Lupercio 07/24/2014, 4:08 PM

## 2014-07-24 NOTE — Progress Notes (Signed)
Orthopedic Tech Progress Note Patient Details:  Hoy MornVergil T Barga [birth year] [id number]  Ortho Devices Type of Ortho Device: Crutches Ortho Device/Splint Interventions: Application   Nikki DomCrawford, Jen Benedict 07/24/2014, 4:08 PM

## 2014-07-27 ENCOUNTER — Telehealth: Payer: Self-pay | Admitting: Endocrinology

## 2014-07-27 NOTE — Telephone Encounter (Signed)
Last three days of readings have been:  12/15- fasting 12:28 pm 183 12/14- 8:01 fasting 142 12:08 am 231 took 12 units of novolog 12/13- 3:42 pm  Before eating  218.  Call was transferred to linda for some assistance.

## 2014-07-27 NOTE — Telephone Encounter (Signed)
Pt needs call back his blood sugars are high 234 and have been high since 07/21/14 since his hip replacement

## 2014-07-27 NOTE — Telephone Encounter (Signed)
ok 

## 2014-07-27 NOTE — Telephone Encounter (Signed)
Patient reports that FBSs are high since his surgery last week.  FBS today was 183 at 11AM.  He has not been taking his Lantus every day--"usually every other day", but has taken it every day since Sunday.   Stressed the need to take this every day! I told him to increase the Lantus by 2u every 3 days until FBSs are below 120.  Continue to take the Novolog as directed.  He asurred me that he is taking his Novolog before all meals, and will take 2u more if his blood sugars are still high.

## 2014-07-28 ENCOUNTER — Ambulatory Visit: Payer: Managed Care, Other (non HMO) | Admitting: Endocrinology

## 2014-08-10 ENCOUNTER — Other Ambulatory Visit: Payer: Self-pay | Admitting: *Deleted

## 2014-08-10 MED ORDER — INSULIN LISPRO 100 UNIT/ML (KWIKPEN): PEN_INJECTOR | SUBCUTANEOUS | Status: DC

## 2014-09-10 ENCOUNTER — Other Ambulatory Visit: Payer: Self-pay | Admitting: *Deleted

## 2014-09-10 DIAGNOSIS — I82402 Acute embolism and thrombosis of unspecified deep veins of left lower extremity: Secondary | ICD-10-CM

## 2014-09-10 DIAGNOSIS — I2699 Other pulmonary embolism without acute cor pulmonale: Secondary | ICD-10-CM

## 2014-09-10 NOTE — Telephone Encounter (Signed)
Pt is out of meds, please refill for him.

## 2014-09-13 MED ORDER — APIXABAN 5 MG PO TABS: 5.0000 mg | ORAL_TABLET | Freq: Two times a day (BID) | ORAL | Status: DC

## 2014-09-13 NOTE — Telephone Encounter (Signed)
I am only Rxing 30 days bc stop date for med was 08/12/2014. Has appt 09/15/14 Dr Kirtland BouchardK and discussion of risks and benefits may occur then.

## 2014-09-14 ENCOUNTER — Telehealth: Payer: Self-pay | Admitting: Internal Medicine

## 2014-09-14 NOTE — Telephone Encounter (Signed)
Call to patient to confirm appointment for 09/15/14 at 2:45. LMTCB °

## 2014-09-15 ENCOUNTER — Ambulatory Visit (INDEPENDENT_AMBULATORY_CARE_PROVIDER_SITE_OTHER): Payer: Managed Care, Other (non HMO) | Admitting: Internal Medicine

## 2014-09-15 ENCOUNTER — Other Ambulatory Visit (INDEPENDENT_AMBULATORY_CARE_PROVIDER_SITE_OTHER): Payer: Managed Care, Other (non HMO)

## 2014-09-15 ENCOUNTER — Encounter: Payer: Self-pay | Admitting: Internal Medicine

## 2014-09-15 VITALS — BP 121/75 | HR 70 | Temp 98.3°F | Ht 72.0 in | Wt 198.9 lb

## 2014-09-15 DIAGNOSIS — E1165 Type 2 diabetes mellitus with hyperglycemia: Secondary | ICD-10-CM

## 2014-09-15 DIAGNOSIS — E118 Type 2 diabetes mellitus with unspecified complications: Secondary | ICD-10-CM

## 2014-09-15 DIAGNOSIS — I2699 Other pulmonary embolism without acute cor pulmonale: Secondary | ICD-10-CM

## 2014-09-15 DIAGNOSIS — I82402 Acute embolism and thrombosis of unspecified deep veins of left lower extremity: Secondary | ICD-10-CM

## 2014-09-15 DIAGNOSIS — M162 Bilateral osteoarthritis resulting from hip dysplasia: Secondary | ICD-10-CM

## 2014-09-15 DIAGNOSIS — IMO0002 Reserved for concepts with insufficient information to code with codable children: Secondary | ICD-10-CM

## 2014-09-15 LAB — BASIC METABOLIC PANEL
BUN: 13 mg/dL (ref 6–23)
CO2: 30 mEq/L (ref 19–32)
Calcium: 9.4 mg/dL (ref 8.4–10.5)
Chloride: 101 mEq/L (ref 96–112)
Creatinine, Ser: 0.89 mg/dL (ref 0.40–1.50)
GFR: 95.43 mL/min (ref >60.00)
Glucose, Bld: 207 mg/dL — ABNORMAL HIGH (ref 70–99)
Potassium: 4.3 mEq/L (ref 3.5–5.1)
Sodium: 136 mEq/L (ref 135–145)

## 2014-09-15 LAB — HEMOGLOBIN A1C: Hgb A1c MFr Bld: 7.1 % — ABNORMAL HIGH (ref 4.6–6.5)

## 2014-09-15 LAB — COMPREHENSIVE METABOLIC PANEL
ALT: 45 U/L (ref 0–53)
AST: 28 U/L (ref 0–37)
Albumin: 4.1 g/dL (ref 3.5–5.2)
Alkaline Phosphatase: 83 U/L (ref 39–117)
BUN: 13 mg/dL (ref 6–23)
CO2: 30 mEq/L (ref 19–32)
Calcium: 9.4 mg/dL (ref 8.4–10.5)
Chloride: 101 mEq/L (ref 96–112)
Creatinine, Ser: 0.89 mg/dL (ref 0.40–1.50)
GFR: 95.43 mL/min (ref >60.00)
Glucose, Bld: 207 mg/dL — ABNORMAL HIGH (ref 70–99)
Potassium: 4.3 mEq/L (ref 3.5–5.1)
Sodium: 136 mEq/L (ref 135–145)
Total Bilirubin: 0.4 mg/dL (ref 0.2–1.2)
Total Protein: 7.3 g/dL (ref 6.0–8.3)

## 2014-09-15 NOTE — Assessment & Plan Note (Signed)
No recurrence. Compliant with his oral anticoagulation therapy without complications. See more details and under pulmonary embolism section of my documentation.

## 2014-09-15 NOTE — Assessment & Plan Note (Signed)
Doing well. Plan  - will get records from his ophthalmologist  - follow up with Dr Lucianne MussKumar

## 2014-09-15 NOTE — Assessment & Plan Note (Signed)
Significant improvement since surgery in December 2015. He has been unable to complete his physical therapy as outpatient due to cost. Plan -Follow-up with orthopedic surgery. -Hopefully will be able to return to work in the next few weeks. -Encouraged him to continue with outpatient physical therapy as affordable.

## 2014-09-15 NOTE — Progress Notes (Signed)
Patient ID: Hoy MornVergil T Nez, male   DOB: Dec 15, 1960, 54 y.o.   MRN: [id number]   Subjective:   HPI: Mr.Kazimierz T Sandi MariscalHutchinson is a 54 y.o. 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  Since his last visit 4 months ago, he has had a total left hip placement by Dr. Mckinley Jewelaniel Murphy. He reports that he feels significantly better. He has been able to ambulate even though he sometimes requires crutches. He has been unable to complete his physical therapy due to cost. He is hoping to return to work in the next few weeks.  In terms of his left lower extremity DVT and PE, he continues to take Apixaban. His co-pay is $40 per month on this medication. He denies any interim history of chest pain, shortness of breath, or recurrence of lower extremity swelling. He denies any bleeding tendencies or easy bruising. He has been very compliant with this medication. He just recently filled a one-month supply.   ROS: Constitutional: Denies fever, chills, diaphoresis, appetite change and fatigue.  Respiratory: Denies SOB, DOE, cough, chest tightness, and wheezing. Denies chest pain. CVS: No chest pain, palpitations and leg swelling.  GI: No abdominal pain, nausea, vomiting, bloody stools GU: No dysuria, frequency, hematuria, or flank pain.  MSK: Left hip pain is currently manageable. Able to ambulate more comfortably since surgery Psych: No depression symptoms. No SI or SA.    Objective:  Physical Exam: Filed Vitals:   09/15/14 1444  BP: 121/75  Pulse: 70  Temp: 98.3 F (36.8 C)  TempSrc: Oral  Height: 6' (1.829 m)  Weight: 198 lb 14.4 oz (90.22 kg)  SpO2: 100%   General: Well nourished. No acute distress. Has one crutch. HEENT: Normal oral mucosa. MMM.  Lungs: CTA  bilaterally. Heart: RRR; no extra sounds or murmurs  Abdomen: Non-distended, normal bowel sounds, soft, nontender; no hepatosplenomegaly  Extremities: No edema. Cuff is normal bilaterally. No calf tenderness. Neurologic: Normal EOM,  Alert and oriented x3. No obvious neurologic/cranial nerve deficits.  Assessment & Plan:  Discussed case with my attending in the clinic, Dr. Heide SparkNarendra See problem based charting.

## 2014-09-15 NOTE — Assessment & Plan Note (Signed)
I had a lengthy discussions with the patient regarding the risks, cost and benefit of continuing with oral anticoagulation at this point. 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 Since his surgery, he is now more mobile and the pain is significantly reduced. I informed him that with just three months of oral anticoagulation, the risk of recurrent venous thromboembolism over the subsequent five years is approximately 25%. Fortunately, patient does not have other identifiable risk factors for venous thromboembolism like smoking or family history. He did not have coagulopathy panel, but I don't think this is necessary. He was concerned about his risk of bleeding with continued anticoagulation. He has completed 7 months of anticoagulation. I will extend his anticoagulation therapy for an extra month since he recently got a supply of one month. He pays $40 for a monthly supply of Apixaban.  Plan -Stop Eliquis on 10/11/2014 -After one week, he will start Aspirin 81 mg daily. This will not be for his thromboembolism prophylaxis, but instead for other health benefits since his diabetic -Discussed with the patient about signs and symptoms of DVT and pulmonary embolism. Encouraged him to present to the emergency department if he suspects a blood clot.

## 2014-09-15 NOTE — Patient Instructions (Signed)
Please stop taking Eliquis in one month Start taking Aspirin 81 mg daily one week after you have stopped eliquis  Please be sure to follow up with your eye doctor.  Please come back to see me in 3 months

## 2014-09-17 LAB — FRUCTOSAMINE: Fructosamine: 276 umol/L — ABNORMAL HIGH (ref 190–270)

## 2014-09-20 ENCOUNTER — Ambulatory Visit (INDEPENDENT_AMBULATORY_CARE_PROVIDER_SITE_OTHER): Payer: Managed Care, Other (non HMO) | Admitting: Endocrinology

## 2014-09-20 ENCOUNTER — Encounter: Payer: Self-pay | Admitting: Endocrinology

## 2014-09-20 VITALS — BP 148/90 | HR 86 | Temp 98.0°F | Resp 14 | Ht 72.0 in | Wt 205.6 lb

## 2014-09-20 DIAGNOSIS — E1165 Type 2 diabetes mellitus with hyperglycemia: Secondary | ICD-10-CM

## 2014-09-20 DIAGNOSIS — IMO0002 Reserved for concepts with insufficient information to code with codable children: Secondary | ICD-10-CM

## 2014-09-20 NOTE — Progress Notes (Signed)
Gregory May 54 y.o.    Reason for Appointment: Diabetes follow-up   History of Present Illness   Diagnosis: Type 2 DIABETES MELITUS, date of diagnosis:  1999     Previous history: He has previously been treated with metformin and Victoza and subsequently mealtime insulin added to control postprandial hyperglycemia Overall he has been  relatively noncompliant with his diet, medications, monitoring and followup Previously would not take Victoza regularly because of cost. Has not been taking any medications for the last year and a half because of commitments to family and cost A1c had increased to 12.3% and hyperglycemia discovered when he was hospitalized for foot ulcer. Also lost 20 pounds because of hyperglycemia   Recent history:  His blood sugars Have gone up as judged by his slightly higher A1c and prior lab glucose.  He has not been able to get any mealtime coverage because of cost of the analog insulin. He did not call to report this.  He tends to need mealtime coverage especially at suppertime fairly consistently. Also since he is eating a little better after his hip surgery is gaining weight.    He has taken his Lantus more consistently compared to before and also has gone up a couple of units to compensate for lack of NovoLog.  He has not been taking any oral hypoglycemic drugs at the same time.   For various reasons has not checked his blood sugar lately also     Insulin regimen: Lantus: 18 units at bedtime qd   Novolog 0, was on 7 units at breakfast, ---10/12 units suppertime     Proper timing of injections in relation to meals:  usually   Oral hypoglycemic drugs: None        Side effects from medications: None       Monitors blood glucose: Rarely now.  Glucometer: One Touch.          Blood Glucose readings: am  ~130.   Hypoglycemia: None    Meals:  usually 2 meals per day usually.  breakfast at 12 noon, evening meal 7 pm         Physical activity: exercise:None   But planning to get some physical therapy            Weight control:   Wt Readings from Last 3 Encounters:  09/20/14 205 lb 9.6 oz (93.26 kg)  09/15/14 198 lb 14.4 oz (90.22 kg)  07/21/14 199 lb (90.266 kg)          Complications: Diabetic foot ulcer, Now healed  Diabetes labs:  Lab Results  Component Value Date   HGBA1C 7.1* 09/15/2014   HGBA1C 6.9* 07/21/2014   HGBA1C 7.3* 07/13/2014   Lab Results  Component Value Date   MICROALBUR 0.8 04/23/2014   LDLCALC 95 07/13/2014   CREATININE 0.89 09/15/2014   CREATININE 0.89 09/15/2014   Appointment on 09/15/2014  Component Date Value Ref Range Status  . Hgb A1c MFr Bld 09/15/2014 7.1* 4.6 - 6.5 % Final   Glycemic Control Guidelines for People with Diabetes:Non Diabetic:  <6%Goal of Therapy: <7%Additional Action Suggested:  >8%   . Sodium 09/15/2014 136  135 - 145 mEq/L Final  . Potassium 09/15/2014 4.3  3.5 - 5.1 mEq/L Final  . Chloride 09/15/2014 101  96 - 112 mEq/L Final  . CO2 09/15/2014 30  19 - 32 mEq/L Final  . Glucose, Bld 09/15/2014 207* 70 - 99 mg/dL Final  . BUN 16/05/9603 13  6 - 23  mg/dL Final  . Creatinine, Ser 09/15/2014 0.89  0.40 - 1.50 mg/dL Final  . Total Bilirubin 09/15/2014 0.4  0.2 - 1.2 mg/dL Final  . Alkaline Phosphatase 09/15/2014 83  39 - 117 U/L Final  . AST 09/15/2014 28  0 - 37 U/L Final  . ALT 09/15/2014 45  0 - 53 U/L Final  . Total Protein 09/15/2014 7.3  6.0 - 8.3 g/dL Final  . Albumin 56/21/308602/10/2014 4.1  3.5 - 5.2 g/dL Final  . Calcium 57/84/696202/10/2014 9.4  8.4 - 10.5 mg/dL Final  . GFR 95/28/413202/10/2014 95.43  >60.00 mL/min Final  . Sodium 09/15/2014 136  135 - 145 mEq/L Final  . Potassium 09/15/2014 4.3  3.5 - 5.1 mEq/L Final  . Chloride 09/15/2014 101  96 - 112 mEq/L Final  . CO2 09/15/2014 30  19 - 32 mEq/L Final  . Glucose, Bld 09/15/2014 207* 70 - 99 mg/dL Final  . BUN 44/01/027202/10/2014 13  6 - 23 mg/dL Final  . Creatinine, Ser 09/15/2014 0.89  0.40 - 1.50 mg/dL Final  . Calcium 53/66/440302/10/2014 9.4  8.4 - 10.5  mg/dL Final  . GFR 47/42/595602/10/2014 95.43  >60.00 mL/min Final  . Fructosamine 09/15/2014 276* 190 - 270 umol/L Final       Medication List       This list is accurate as of: 09/20/14  1:30 PM.  Always use your most recent med list.               apixaban 5 MG Tabs tablet  Commonly known as:  ELIQUIS  Take 1 tablet (5 mg total) by mouth 2 (two) times daily.     bag balm Oint ointment  Apply 1 application topically as needed for dry skin.     bisacodyl 5 MG EC tablet  Commonly known as:  DULCOLAX  Take 1 tablet (5 mg total) by mouth daily as needed for moderate constipation.     cholecalciferol 1000 UNITS tablet  Commonly known as:  VITAMIN D  Take 1,000 Units by mouth daily.     gabapentin 100 MG capsule  Commonly known as:  NEURONTIN  Take 3 capsules (300 mg total) by mouth 3 (three) times daily.     HYDROcodone-acetaminophen 5-325 MG per tablet  Commonly known as:  NORCO/VICODIN     insulin glargine 100 UNIT/ML injection  Commonly known as:  LANTUS  Inject 15 Units into the skin every other day.     insulin lispro 100 UNIT/ML KiwkPen  Commonly known as:  HUMALOG KWIKPEN  Inject 7-10 units 2 times a day     methocarbamol 500 MG tablet  Commonly known as:  ROBAXIN  Take 1 tablet (500 mg total) by mouth 4 (four) times daily.     mupirocin ointment 2 %  Commonly known as:  BACTROBAN  Apply 1 application topically daily. Applied to affected heal     ondansetron 4 MG tablet  Commonly known as:  ZOFRAN  Take 1 tablet (4 mg total) by mouth every 8 (eight) hours as needed for nausea or vomiting.     oxyCODONE-acetaminophen 5-325 MG per tablet  Commonly known as:  ROXICET  Take 1-2 tablets by mouth every 4 (four) hours as needed.     pravastatin 40 MG tablet  Commonly known as:  PRAVACHOL  Take 1 tablet (40 mg total) by mouth daily.     RELION INSULIN SYR 0.3ML/31G 31G X 5/16" 0.3 ML Misc  Generic drug:  Insulin Syringe-Needle U-100  Vitamin D (Ergocalciferol)  50000 UNITS Caps capsule  Commonly known as:  DRISDOL  Take 1 capsule (50,000 Units total) by mouth every 7 (seven) days.        Allergies: No Known Allergies  Past Medical History  Diagnosis Date  . Sebaceous cyst     on back of neck  . Diabetes mellitus     type II  . Pulmonary embolism   . Deep vein thrombosis (DVT)   . Arthritis     bilateral hips  . Diabetic ulcer of heel     Right heel  . DJD (degenerative joint disease)     Past Surgical History  Procedure Laterality Date  . Rotator cuff repair  2005 (approx)    right   . Closed reduction with humer pin insertion  1974    left hip  . Joint replacement  2006 (approx)    right hip replaced  . 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 Total hip arthroplasty Left 07/21/2014    Procedure: TOTAL HIP ARTHROPLASTY ANTERIOR APPROACH;  Surgeon: Loreta Ave, MD;  Location: [facility] OR;  Service: Orthopedics;  Laterality: Left;  . Hardware removal Left 07/21/2014    Procedure: HARDWARE REMOVAL;  Surgeon: Loreta Ave, MD;  Location: [facility] OR;  Service: Orthopedics;  Laterality: Left;    Family History  Problem Relation Age of Onset  . Cancer Mother     breast, colon, liver  . Diabetes Father     Social History:  reports that he has never smoked. He has never used smokeless tobacco. He reports that he drinks alcohol. He reports that he does not use illicit drugs.  Review of Systems:  Hypertension:  not present  But he is having a significantly high blood pressure today.  He has been taking a generic Sinutab regularly including this morning for his upper respiratory infection.  Lipids: He is on Pravachol with good control of LDLas of  12/15    Lab Results  Component Value Date   CHOL 164 07/13/2014   HDL 57.80 07/13/2014   LDLCALC 95 07/13/2014   TRIG 57.0 07/13/2014   CHOLHDL 3 07/13/2014    He is now having less hip pain after surgery surgery   Examination:   BP 162/84 mmHg  Pulse 86   Temp(Src) 98 F (36.7 C)  Resp 14  Ht 6' (1.829 m)  Wt 205 lb 9.6 oz (93.26 kg)  BMI 27.88 kg/m2  SpO2 95%  Body mass index is 27.88 kg/(m^2).      ASSESSMENT/ PLAN:    Diabetes type 2:  Blood glucose control has been controlled because of running out of his mealtime insulin coverage.  His lab glucose was 207  A1c is as high but blood sugars are higher mostly in the last 3 weeks.  He has not monitored his blood sugar at home  Recommendations made today:  He will Use a co-pay card to get Humalog pens and resume his dose as before.   start checking his blood sugar again including after meals.   He will try to keep his 2 hour postprandial readings under 180.   May need to reduce his Lantus back to 16 as overnight blood sugars should improve.     Reduce caloric intake to prevent further weight gain.   resume exercise when able to    Increased blood pressure: likely to be from his decongestant medications and we will stop this  [facility] 09/20/2014,  1:30 PM

## 2014-09-20 NOTE — Progress Notes (Signed)
INTERNAL MEDICINE TEACHING ATTENDING ADDENDUM - Ashiya Kinkead, MD: I reviewed and discussed at the time of visit with the resident Dr. Kazibwe, the patient's medical history, physical examination, diagnosis and results of pertinent tests and treatment and I agree with the patient's care as documented.  

## 2014-09-20 NOTE — Patient Instructions (Addendum)
Humalog as directed  No decongestants.   Please check blood sugars at least half the time about 2 hours after any meal and 3 times per week on waking up. Please bring blood sugar monitor to each visit. Recommended blood sugar levels about 2 hours after meal is 140-180 and on waking up 90-130

## 2014-10-20 ENCOUNTER — Other Ambulatory Visit: Payer: Self-pay | Admitting: Endocrinology

## 2014-12-07 ENCOUNTER — Telehealth: Payer: Self-pay | Admitting: Internal Medicine

## 2014-12-07 NOTE — Telephone Encounter (Signed)
Call to patient to confirm appointment for 12/08/14 at 3:45 lmtcb ° °

## 2014-12-08 ENCOUNTER — Ambulatory Visit (INDEPENDENT_AMBULATORY_CARE_PROVIDER_SITE_OTHER): Payer: Managed Care, Other (non HMO) | Admitting: Internal Medicine

## 2014-12-08 ENCOUNTER — Encounter: Payer: Self-pay | Admitting: Internal Medicine

## 2014-12-08 ENCOUNTER — Ambulatory Visit (HOSPITAL_COMMUNITY)
Admission: RE | Admit: 2014-12-08 | Discharge: 2014-12-08 | Disposition: A | Discharge status: Home / Self Care | Payer: Managed Care, Other (non HMO) | Source: Ambulatory Visit | Attending: Internal Medicine | Admitting: Internal Medicine

## 2014-12-08 VITALS — BP 132/72 | HR 70 | Temp 98.3°F | Ht 72.0 in | Wt 212.5 lb

## 2014-12-08 DIAGNOSIS — R202 Paresthesia of skin: Secondary | ICD-10-CM | POA: Diagnosis present

## 2014-12-08 DIAGNOSIS — I2699 Other pulmonary embolism without acute cor pulmonale: Secondary | ICD-10-CM

## 2014-12-08 DIAGNOSIS — L97529 Non-pressure chronic ulcer of other part of left foot with unspecified severity: Secondary | ICD-10-CM

## 2014-12-08 DIAGNOSIS — Z794 Long term (current) use of insulin: Secondary | ICD-10-CM

## 2014-12-08 DIAGNOSIS — M5030 Other cervical disc degeneration, unspecified cervical region: Secondary | ICD-10-CM | POA: Diagnosis not present

## 2014-12-08 DIAGNOSIS — I82402 Acute embolism and thrombosis of unspecified deep veins of left lower extremity: Secondary | ICD-10-CM

## 2014-12-08 DIAGNOSIS — E11621 Type 2 diabetes mellitus with foot ulcer: Secondary | ICD-10-CM

## 2014-12-08 DIAGNOSIS — R200 Anesthesia of skin: Secondary | ICD-10-CM | POA: Diagnosis not present

## 2014-12-08 DIAGNOSIS — E1142 Type 2 diabetes mellitus with diabetic polyneuropathy: Secondary | ICD-10-CM | POA: Diagnosis not present

## 2014-12-08 DIAGNOSIS — L97519 Non-pressure chronic ulcer of other part of right foot with unspecified severity: Secondary | ICD-10-CM

## 2014-12-08 DIAGNOSIS — E118 Type 2 diabetes mellitus with unspecified complications: Secondary | ICD-10-CM

## 2014-12-08 LAB — GLUCOSE, CAPILLARY: Glucose-Capillary: 127 mg/dL — ABNORMAL HIGH (ref 70–99)

## 2014-12-08 MED ORDER — GABAPENTIN 300 MG PO CAPS: 300.0000 mg | ORAL_CAPSULE | Freq: Three times a day (TID) | ORAL | Status: DC

## 2014-12-08 NOTE — Patient Instructions (Addendum)
General Instructions: We will check x ray of your neck. I will give a call  Please take gabapentin 300 mg daily once a day on day 1, 300 mg twice a day on day 2 and then 300 mg three times a day on day 3 and continue with 300 mg three time a day. Pleas follow up here in 6 months.  Please bring your medicines with you each time you come to clinic.  Medicines may include prescription medications, over-the-counter medications, herbal remedies, eye drops, vitamins, or other pills.   Progress Toward Treatment Goals:  Treatment Goal 09/15/2014  Hemoglobin A1C at goal    Self Care Goals & Plans:  Self Care Goal 12/08/2014  Manage my medications take my medicines as prescribed; bring my medications to every visit; refill my medications on time  Monitor my health keep track of my blood glucose; bring my glucose meter and log to each visit  Eat healthy foods drink diet soda or water instead of juice or soda; eat more vegetables; eat foods that are low in salt; eat baked foods instead of fried foods; eat fruit for snacks and desserts  Be physically active -    Home Blood Glucose Monitoring 09/15/2014  Check my blood sugar 3 times a day  When to check my blood sugar before breakfast; before lunch; before dinner     Care Management & Community Referrals:  Referral 05/18/2014  Referrals made for care management support none needed       Gabapentin capsules or tablets What is this medicine? GABAPENTIN (GA ba pen tin) is used to control partial seizures in adults with epilepsy. It is also used to treat certain types of nerve pain. This medicine may be used for other purposes; ask your health care provider or pharmacist if you have questions. COMMON BRAND NAME(S): Ascencion DikeGabarone, Neurontin What should I tell my health care provider before I take this medicine? They need to know if you have any of these conditions: -kidney disease -suicidal thoughts, plans, or attempt; a previous suicide attempt by  you or a family member -an unusual or allergic reaction to gabapentin, other medicines, foods, dyes, or preservatives -pregnant or trying to get pregnant -breast-feeding How should I use this medicine? Take this medicine by mouth with a glass of water. Follow the directions on the prescription label. You can take it with or without food. If it upsets your stomach, take it with food.Take your medicine at regular intervals. Do not take it more often than directed. Do not stop taking except on your doctor's advice. If you are directed to break the 600 or 800 mg tablets in half as part of your dose, the extra half tablet should be used for the next dose. If you have not used the extra half tablet within 28 days, it should be thrown away. A special MedGuide will be given to you by the pharmacist with each prescription and refill. Be sure to read this information carefully each time. Talk to your pediatrician regarding the use of this medicine in children. Special care may be needed. Overdosage: If you think you have taken too much of this medicine contact a poison control center or emergency room at once. NOTE: This medicine is only for you. Do not share this medicine with others. What if I miss a dose? If you miss a dose, take it as soon as you can. If it is almost time for your next dose, take only that dose. Do not take double  or extra doses. What may interact with this medicine? Do not take this medicine with any of the following medications: -other gabapentin products This medicine may also interact with the following medications: -alcohol -antacids -antihistamines for allergy, cough and cold -certain medicines for anxiety or sleep -certain medicines for depression or psychotic disturbances -homatropine; hydrocodone -naproxen -narcotic medicines (opiates) for pain -phenothiazines like chlorpromazine, mesoridazine, prochlorperazine, thioridazine This list may not describe all possible  interactions. Give your health care provider a list of all the medicines, herbs, non-prescription drugs, or dietary supplements you use. Also tell them if you smoke, drink alcohol, or use illegal drugs. Some items may interact with your medicine. What should I watch for while using this medicine? Visit your doctor or health care professional for regular checks on your progress. You may want to keep a record at home of how you feel your condition is responding to treatment. You may want to share this information with your doctor or health care professional at each visit. You should contact your doctor or health care professional if your seizures get worse or if you have any new types of seizures. Do not stop taking this medicine or any of your seizure medicines unless instructed by your doctor or health care professional. Stopping your medicine suddenly can increase your seizures or their severity. Wear a medical identification bracelet or chain if you are taking this medicine for seizures, and carry a card that lists all your medications. You may get drowsy, dizzy, or have blurred vision. Do not drive, use machinery, or do anything that needs mental alertness until you know how this medicine affects you. To reduce dizzy or fainting spells, do not sit or stand up quickly, especially if you are an older patient. Alcohol can increase drowsiness and dizziness. Avoid alcoholic drinks. Your mouth may get dry. Chewing sugarless gum or sucking hard candy, and drinking plenty of water will help. The use of this medicine may increase the chance of suicidal thoughts or actions. Pay special attention to how you are responding while on this medicine. Any worsening of mood, or thoughts of suicide or dying should be reported to your health care professional right away. Women who become pregnant while using this medicine may enroll in the [place] American Antiepileptic Drug Pregnancy Registry by calling [phone]. This  registry collects information about the safety of antiepileptic drug use during pregnancy. What side effects may I notice from receiving this medicine? Side effects that you should report to your doctor or health care professional as soon as possible: -allergic reactions like skin rash, itching or hives, swelling of the face, lips, or tongue -worsening of mood, thoughts or actions of suicide or dying Side effects that usually do not require medical attention (report to your doctor or health care professional if they continue or are bothersome): -constipation -difficulty walking or controlling muscle movements -dizziness -nausea -slurred speech -tiredness -tremors -weight gain This list may not describe all possible side effects. Call your doctor for medical advice about side effects. You may report side effects to FDA at 1-800-FDA-1088. Where should I keep my medicine? Keep out of reach of children. Store at room temperature between 15 and 30 degrees C (59 and 86 degrees F). Throw away any unused medicine after the expiration date. NOTE: This sheet is a summary. It may not cover all possible information. If you have questions about this medicine, talk to your doctor, pharmacist, or health care provider.  2015, Elsevier/Gold Standard. (2013-04-02 09:12:48)

## 2014-12-09 ENCOUNTER — Encounter: Payer: Self-pay | Admitting: Internal Medicine

## 2014-12-09 DIAGNOSIS — G5600 Carpal tunnel syndrome, unspecified upper limb: Secondary | ICD-10-CM

## 2014-12-09 DIAGNOSIS — E1149 Type 2 diabetes mellitus with other diabetic neurological complication: Secondary | ICD-10-CM | POA: Insufficient documentation

## 2014-12-09 DIAGNOSIS — E1142 Type 2 diabetes mellitus with diabetic polyneuropathy: Secondary | ICD-10-CM | POA: Insufficient documentation

## 2014-12-09 DIAGNOSIS — E1141 Type 2 diabetes mellitus with diabetic mononeuropathy: Secondary | ICD-10-CM | POA: Insufficient documentation

## 2014-12-09 NOTE — Progress Notes (Signed)
Patient ID: Gregory May, male   DOB: 12/04/1960, 54 y.o.   MRN: [id number]   Subjective:   HPI: [name] is a 54 y.o. 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  He had total left hip placement by Dr. Mckinley Jewelaniel May in 07/2014. He has done well and he reports that he just recently went back to his job. He no-longer requires crutches.   In terms of his left lower extremity DVT and PE, he has been off Apixaban since completing 7 months of treatment.   Right diabetic foot ulcer is almost completely healed.   Today he complains of numbness and tingling mostly in the right upper extremity. He feels that the numbness mostly involves the third to the fifth fingers, but sometimes also involves the thumb. He denies neck pain. His arms, sometimes goes to sleep. However, he also reports similar symptoms, but at a mild extent in all his extremities, mostly involving the toe tips in the fingertips. He denies neck pain. No other focal neurological deficits.  ROS: Constitutional: Denies fever, chills, diaphoresis, appetite change and fatigue.  Respiratory: Denies SOB, DOE, cough, chest tightness, and wheezing. Denies chest pain. CVS: No chest pain, palpitations and leg swelling.  GI: No abdominal pain, nausea, vomiting, bloody stools GU: No dysuria, frequency, hematuria, or flank pain.  MSK: Left hip pain is currently manageable. He has a few pills of Norco left to help with pain if needed. Psych: No depression symptoms. No SI or SA.    Objective:  Physical Exam: Filed Vitals:   12/08/14 1607  BP: 132/72  Pulse: 70  Temp: 98.3 F (36.8 C)  TempSrc: Oral  Height: 6' (1.829 m)  Weight: 212 lb 8 oz (96.389 kg)  SpO2: 96%   General: able to ambulate  without assistive devices. Well nourished. No acute distress. HEENT: Normal oral mucosa. MMM. No neck tenderness. Able to move his neck with normal range of motion. Lungs: CTA bilaterally. Heart: RRR; no extra sounds or murmurs  Abdomen: Non-distended, normal bowel sounds, soft, nontender; no hepatosplenomegaly  Extremities: No edema. Cuff is normal bilaterally. No calf tenderness. Right shoulder exam is normal. Has good strength in all muscle groups of the Rt UE. I don't appreciate any sensory deficits.  Neurologic: Normal EOM,  Alert and oriented x3. No obvious neurologic/cranial nerve deficits.  Assessment & Plan:  Discussed case with my attending in the clinic, Dr Gregory May See problem based charting.

## 2014-12-09 NOTE — Assessment & Plan Note (Signed)
Symptoms are consistent with diabetic neuropathy.  Plans  - Start Gabapentin 300 mg tid - follow up in 2 months

## 2014-12-09 NOTE — Assessment & Plan Note (Signed)
Ulcer almost healed up. Recommended diabetes shoes with inserts.

## 2014-12-09 NOTE — Assessment & Plan Note (Signed)
Off oral anticoagulants at this time. Asymptomatic.

## 2014-12-09 NOTE — Assessment & Plan Note (Signed)
Assessment: Most likely diagnosis cervical radiculopathy versus diabetic neuropathy. No neck pain or tenderness on exam.   Plan: 1. Labs/imaging: Complete cervical spine x ray reveals mild degenerative disk disease.  2. Therapy: Start Gabapentin 300 mg tid 3. Follow up: follow up in 2-3 months. We can uptitrate dose of gabapentin if pain is not well controlled with the above dose.

## 2014-12-09 NOTE — Assessment & Plan Note (Signed)
Well-controlled on Lantus 15 units daily. Patient is managed by Dr. Lucianne MussKumar of endocrinology.

## 2014-12-09 NOTE — Assessment & Plan Note (Signed)
>>  ASSESSMENT AND PLAN FOR DIABETIC POLYNEUROPATHY ASSOCIATED WITH TYPE 2 DIABETES MELLITUS WRITTEN ON 12/09/2014  9:15 AM BY Dow Adolph, MD  Symptoms are consistent with diabetic neuropathy.  Plans  - Start Gabapentin 300 mg tid - follow up in 2 months

## 2014-12-12 NOTE — Progress Notes (Signed)
Case discussed with Dr. Kazibwe soon after the resident saw the patient.  We reviewed the resident's history and exam and pertinent patient test results.  I agree with the assessment, diagnosis, and plan of care documented in the resident's note. 

## 2014-12-15 ENCOUNTER — Other Ambulatory Visit (INDEPENDENT_AMBULATORY_CARE_PROVIDER_SITE_OTHER): Payer: Managed Care, Other (non HMO)

## 2014-12-15 DIAGNOSIS — E1165 Type 2 diabetes mellitus with hyperglycemia: Secondary | ICD-10-CM

## 2014-12-15 DIAGNOSIS — IMO0002 Reserved for concepts with insufficient information to code with codable children: Secondary | ICD-10-CM

## 2014-12-15 LAB — BASIC METABOLIC PANEL
BUN: 12 mg/dL (ref 6–23)
CO2: 28 mEq/L (ref 19–32)
Calcium: 9 mg/dL (ref 8.4–10.5)
Chloride: 102 mEq/L (ref 96–112)
Creatinine, Ser: 0.99 mg/dL (ref 0.40–1.50)
GFR: 84.31 mL/min (ref >60.00)
Glucose, Bld: 210 mg/dL — ABNORMAL HIGH (ref 70–99)
Potassium: 4 mEq/L (ref 3.5–5.1)
Sodium: 134 mEq/L — ABNORMAL LOW (ref 135–145)

## 2014-12-15 LAB — HEMOGLOBIN A1C: Hgb A1c MFr Bld: 8.2 % — ABNORMAL HIGH (ref 4.6–6.5)

## 2014-12-20 ENCOUNTER — Encounter: Payer: Self-pay | Admitting: Endocrinology

## 2014-12-20 ENCOUNTER — Other Ambulatory Visit: Payer: Self-pay | Admitting: *Deleted

## 2014-12-20 ENCOUNTER — Ambulatory Visit (INDEPENDENT_AMBULATORY_CARE_PROVIDER_SITE_OTHER): Payer: Managed Care, Other (non HMO) | Admitting: Endocrinology

## 2014-12-20 VITALS — BP 124/74 | HR 79 | Temp 98.2°F | Resp 14 | Ht 72.0 in | Wt 210.6 lb

## 2014-12-20 DIAGNOSIS — G5601 Carpal tunnel syndrome, right upper limb: Secondary | ICD-10-CM

## 2014-12-20 DIAGNOSIS — IMO0002 Reserved for concepts with insufficient information to code with codable children: Secondary | ICD-10-CM

## 2014-12-20 DIAGNOSIS — E1165 Type 2 diabetes mellitus with hyperglycemia: Secondary | ICD-10-CM | POA: Diagnosis not present

## 2014-12-20 MED ORDER — INSULIN GLARGINE 300 UNIT/ML ~~LOC~~ SOPN: 18.0000 [IU] | PEN_INJECTOR | Freq: Every day | SUBCUTANEOUS | Status: DC

## 2014-12-20 NOTE — Patient Instructions (Signed)
Please check blood sugars at least half the time about 2 hours after any meal and 3 times per week on waking up.  Please bring blood sugar monitor to each visit.  Recommended blood sugar levels about 2 hours after meal is 140-180 and on waking up 90-130  RELION REGULAR INSULIN replaces Humalog but may need to take 20-30 min before meal Take 6-8 units at lunch based on Carbs  Lantus/Toujeo 15 units and keep am sugar <120  Exercise as tolerated

## 2014-12-20 NOTE — Progress Notes (Signed)
Gregory May 54 y.o.    Reason for Appointment: Diabetes follow-up   History of Present Illness   Diagnosis: Type 2 DIABETES MELITUS, date of diagnosis:  1999     Previous history: He has previously been treated with metformin and Victoza and subsequently mealtime insulin added to control postprandial hyperglycemia Overall he has been  relatively noncompliant with his diet, medications, monitoring and followup Previously would not take Victoza regularly because of cost. Has not been taking any medications for the last year and a half because of commitments to family and cost A1c had increased to 12.3% and hyperglycemia discovered when he was hospitalized for foot ulcer. Also lost 20 pounds because of hyperglycemia   Recent history:  His blood sugars a poorly controlled with A1c now 8.1%   He again has some difficulty with affording his insulin.    Current blood sugar patterns and problems identified.   instead of taking 16 units of Lantus she is only taking 12 because of the cost of the insulin   Fasting blood sugars are mildly increased but consistently so   He is eating at least 2 full meals a day and sometimes will have 3 meals with carbohydrates but is not taking insulin with every meal  He does not take his HUMALOG to work for his lunchtime coverage   occasionally has high readings after supper from inadequate Humalog   Checking blood sugars somewhat infrequently overall   No exercise   He has not been taking any oral hypoglycemic drugs at the same time.      Insulin regimen: Lantus: 12 units at bedtime qd. Humalog 7 units at breakfast,  None at lunch,10/12 units suppertime     Proper timing of injections in relation to meals:  usually   Oral hypoglycemic drugs: None        Side effects from medications: None       Monitors blood glucose: Rarely now.  Glucometer: One Touch.          Blood Glucose readings:   PRE-MEAL Breakfast Lunch Dinner  PCS Overall   Glucose range:  128- 163  142  175  129-224   Mean/median:  137     153/147   Hypoglycemia: None    Meals:  usually 2 meals per day usually.  breakfast at 12 noon, evening meal 7 pm         Physical activity: exercise: None              Weight control:   Wt Readings from Last 3 Encounters:  12/20/14 210 lb 9.6 oz (95.528 kg)  12/08/14 212 lb 8 oz (96.389 kg)  09/20/14 205 lb 9.6 oz (93.26 kg)          Complications: Diabetic foot ulcer, Now healed  Diabetes labs:  Lab Results  Component Value Date   HGBA1C 8.2* 12/15/2014   HGBA1C 7.1* 09/15/2014   HGBA1C 6.9* 07/21/2014   Lab Results  Component Value Date   MICROALBUR 0.8 04/23/2014   LDLCALC 95 07/13/2014   CREATININE 0.99 12/15/2014   Lab on 12/15/2014  Component Date Value Ref Range Status  . Hgb A1c MFr Bld 12/15/2014 8.2* 4.6 - 6.5 % Final   Glycemic Control Guidelines for People with Diabetes:Non Diabetic:  <6%Goal of Therapy: <7%Additional Action Suggested:  >8%   . Sodium 12/15/2014 134* 135 - 145 mEq/L Final  . Potassium 12/15/2014 4.0  3.5 - 5.1 mEq/L Final  . Chloride 12/15/2014 102  96 - 112 mEq/L Final  . CO2 12/15/2014 28  19 - 32 mEq/L Final  . Glucose, Bld 12/15/2014 210* 70 - 99 mg/dL Final  . BUN 40/98/1191 12  6 - 23 mg/dL Final  . Creatinine, Ser 12/15/2014 0.99  0.40 - 1.50 mg/dL Final  . Calcium 47/82/9562 9.0  8.4 - 10.5 mg/dL Final  . GFR 13/03/6577 84.31  >60.00 mL/min Final       Medication List       This list is accurate as of: 12/20/14  4:08 PM.  Always use your most recent med list.               bag balm Oint ointment  Apply 1 application topically as needed for dry skin.     cholecalciferol 1000 UNITS tablet  Commonly known as:  VITAMIN D  Take 1,000 Units by mouth daily.     gabapentin 300 MG capsule  Commonly known as:  NEURONTIN  Take 1 capsule (300 mg total) by mouth 3 (three) times daily.     HYDROcodone-acetaminophen 5-325 MG per tablet  Commonly known as:   NORCO/VICODIN     insulin glargine 100 UNIT/ML injection  Commonly known as:  LANTUS  Inject 12 Units into the skin every other day.     insulin lispro 100 UNIT/ML KiwkPen  Commonly known as:  HUMALOG KWIKPEN  Inject 7-10 units 2 times a day     methocarbamol 500 MG tablet  Commonly known as:  ROBAXIN  Take 1 tablet (500 mg total) by mouth 4 (four) times daily.     mupirocin ointment 2 %  Commonly known as:  BACTROBAN  Apply 1 application topically daily. Applied to affected heal     pravastatin 40 MG tablet  Commonly known as:  PRAVACHOL  TAKE ONE TABLET BY MOUTH ONCE DAILY     RELION INSULIN SYR 0.3ML/31G 31G X 5/16" 0.3 ML Misc  Generic drug:  Insulin Syringe-Needle U-100        Allergies: No Known Allergies  Past Medical History  Diagnosis Date  . Sebaceous cyst     on back of neck  . Diabetes mellitus     type II  . Pulmonary embolism   . Deep vein thrombosis (DVT)   . Arthritis     bilateral hips  . Diabetic ulcer of heel     Right heel  . DJD (degenerative joint disease)   . Pulmonary emboli 02/08/2014    Date of diagnosis February 08 2014, on chest CTA Hospitalized for 3 days Had some symptoms of shortness of breath, and chest pain With intercurrent DVT of the left LE. Duration of anticoagulation: 8 months. End date 10/11/2014.  Anticoagulant: Lovenox 120 units daily Switched to Eliquis on 05/25/2014 per patient preference     Past Surgical History  Procedure Laterality Date  . Rotator cuff repair  2005 (approx)    right   . Closed reduction with humer pin insertion  1974    left hip  . Joint replacement  2006 (approx)    right hip replaced  . 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 Total hip arthroplasty Left 07/21/2014    Procedure: TOTAL HIP ARTHROPLASTY ANTERIOR APPROACH;  Surgeon: Loreta Ave, MD;  Location: [facility] OR;  Service: Orthopedics;  Laterality: Left;  . Hardware removal Left 07/21/2014    Procedure: HARDWARE REMOVAL;  Surgeon:  Loreta Ave, MD;  Location: [facility] OR;  Service: Orthopedics;  Laterality: Left;    Family History  Problem Relation Age of Onset  . Cancer Mother     breast, colon, liver  . Diabetes Father     Social History:  reports that he has never smoked. He has never used smokeless tobacco. He reports that he drinks alcohol. He reports that he does not use illicit drugs.  Review of Systems:  Hypertension:  not present   Lipids: He is on Pravachol with good control of LDL as of  12/15    Lab Results  Component Value Date   CHOL 164 07/13/2014   HDL 57.80 07/13/2014   LDLCALC 95 07/13/2014   TRIG 57.0 07/13/2014   CHOLHDL 3 07/13/2014   No recent problems with foot ulcers  He is asking about increased and persistent tingling in the right forearm and hand.  No radiation to the neck His PCP ordered cervical x-ray and this was negative  He still has some tingling and numbness in his feet and taking gabapentin for this with some relief      Examination:   BP 124/74 mmHg  Pulse 79  Temp(Src) 98.2 F (36.8 C)  Resp 14  Ht 6' (1.829 m)  Wt 210 lb 9.6 oz (95.528 kg)  BMI 28.56 kg/m2  SpO2 95%  Body mass index is 28.56 kg/(m^2).     Tinel sign positive in the right hand No atrophy in the right hand muscles  ASSESSMENT/ PLAN:    Diabetes type 2:  Blood glucose control has not been controlled because of poor compliance with insulin as discussed above He is not taking enough basal insulin and also not getting coverage for at least his lunchtime meals  He appears to be needing fairly consistent basal bolus insulin regimen to achieve control , currently not on any oral hypoglycemic drugs also   Most of his difficulties are related to cost of insulin   As a result his A1c is now 8.1 and this was discussed  Recommendations made today:  He will Use a co-pay card to get if a box of Toujeo insulin.  Discussed differences between Lantus and Toujeo and brochure given also    use  generic Regular Insulin from [facility] instead of Humalog when he finishes the Humalog.  Discussed differences between Humalog and Regular Insulin and timing of insulin , duration of action and how to  Draw this  Up in the insulin syringe    bedtime snack if blood sugar is low normal when using Regular Insulin  Start regular walking   take insulin for lunchtime coverage consistently, discussed dosages for various meals.  More consistent monitoring at various times and discussed blood sugar targets both before and after meals   He will need to gradually increase basal insulin to keep morning sugar below 120  RIGHT hand paresthesia and tingling: He appears to have typical carpal tunnel syndrome and advised him to discuss a referral with his PCP   Patient Instructions  Please check blood sugars at least half the time about 2 hours after any meal and 3 times per week on waking up.  Please bring blood sugar monitor to each visit.  Recommended blood sugar levels about 2 hours after meal is 140-180 and on waking up 90-130  RELION REGULAR INSULIN replaces Humalog but may need to take 20-30 min before meal Take 6-8 units at lunch based on Carbs  Lantus/Toujeo 15 units and keep am sugar <120  Exercise as tolerated  Counseling time on subjects  discussed above is over 50% of today's 25 minute visit     Joushua Dugar 12/20/2014, 4:08 PM   Note: This office note was prepared with Insurance underwriter. Any transcriptional errors that result from this process are unintentional.

## 2014-12-21 ENCOUNTER — Telehealth: Payer: Self-pay | Admitting: *Deleted

## 2014-12-21 NOTE — Telephone Encounter (Signed)
Pt called and wanted to go over the xray report from 4/27. He also saw Dr Lucianne MussKumar on 5/9 and was told he may have Carpel Tunnel He is asking that you review those notes and call him when you have the time to talk about both results. Pt # [phone]

## 2014-12-22 ENCOUNTER — Other Ambulatory Visit: Payer: Self-pay | Admitting: *Deleted

## 2014-12-27 ENCOUNTER — Encounter: Payer: Self-pay | Admitting: *Deleted

## 2014-12-28 NOTE — Telephone Encounter (Signed)
Talked to the patient. Encouraged him to make an appointment to see me to evaluate his hand pain. Offered hand splint but he declined.

## 2015-01-28 ENCOUNTER — Telehealth: Payer: Self-pay | Admitting: Endocrinology

## 2015-01-28 ENCOUNTER — Other Ambulatory Visit: Payer: Self-pay | Admitting: *Deleted

## 2015-01-28 ENCOUNTER — Ambulatory Visit: Payer: Managed Care, Other (non HMO) | Admitting: Endocrinology

## 2015-01-28 ENCOUNTER — Other Ambulatory Visit (INDEPENDENT_AMBULATORY_CARE_PROVIDER_SITE_OTHER): Payer: Managed Care, Other (non HMO)

## 2015-01-28 ENCOUNTER — Other Ambulatory Visit: Payer: Self-pay | Admitting: Internal Medicine

## 2015-01-28 DIAGNOSIS — E1165 Type 2 diabetes mellitus with hyperglycemia: Secondary | ICD-10-CM

## 2015-01-28 DIAGNOSIS — IMO0002 Reserved for concepts with insufficient information to code with codable children: Secondary | ICD-10-CM

## 2015-01-28 LAB — BASIC METABOLIC PANEL
BUN: 12 mg/dL (ref 6–23)
CO2: 30 mEq/L (ref 19–32)
Calcium: 9.3 mg/dL (ref 8.4–10.5)
Chloride: 100 mEq/L (ref 96–112)
Creatinine, Ser: 0.83 mg/dL (ref 0.40–1.50)
GFR: 103.28 mL/min (ref >60.00)
Glucose, Bld: 206 mg/dL — ABNORMAL HIGH (ref 70–99)
Potassium: 4 mEq/L (ref 3.5–5.1)
Sodium: 135 mEq/L (ref 135–145)

## 2015-01-28 MED ORDER — INSULIN GLARGINE 100 UNIT/ML ~~LOC~~ SOLN: 12.0000 [IU] | SUBCUTANEOUS | Status: DC

## 2015-01-28 NOTE — Telephone Encounter (Signed)
Please call in to [facility] the lantus for him 90 day supply

## 2015-01-28 NOTE — Telephone Encounter (Signed)
rx sent

## 2015-01-29 LAB — FRUCTOSAMINE: Fructosamine: 277 umol/L (ref 0–285)

## 2015-01-31 ENCOUNTER — Ambulatory Visit: Payer: Managed Care, Other (non HMO) | Admitting: Endocrinology

## 2015-02-04 ENCOUNTER — Other Ambulatory Visit: Payer: Self-pay | Admitting: *Deleted

## 2015-02-04 ENCOUNTER — Encounter: Payer: Self-pay | Admitting: Endocrinology

## 2015-02-04 ENCOUNTER — Ambulatory Visit (INDEPENDENT_AMBULATORY_CARE_PROVIDER_SITE_OTHER): Payer: Managed Care, Other (non HMO) | Admitting: Endocrinology

## 2015-02-04 VITALS — BP 130/84 | HR 86 | Temp 98.2°F | Resp 16 | Ht 72.0 in | Wt 205.4 lb

## 2015-02-04 DIAGNOSIS — IMO0002 Reserved for concepts with insufficient information to code with codable children: Secondary | ICD-10-CM

## 2015-02-04 DIAGNOSIS — E1165 Type 2 diabetes mellitus with hyperglycemia: Secondary | ICD-10-CM | POA: Diagnosis not present

## 2015-02-04 MED ORDER — INSULIN GLARGINE 100 UNIT/ML ~~LOC~~ SOLN: 12.0000 [IU] | SUBCUTANEOUS | Status: DC

## 2015-02-04 NOTE — Patient Instructions (Signed)
Check blood sugars on waking up .Marland Kitchen 3-4 .Marland Kitchen times a week Also check blood sugars about 2 hours after a meal and do this after different meals by rotation  Recommended blood sugar levels on waking up is 90-130 and about 2 hours after meal is 140-180 Please bring blood sugar monitor to each visit.  Take Regular insulin with all meals

## 2015-02-04 NOTE — Progress Notes (Signed)
Gregory May 54 y.o.           Reason for Appointment: Diabetes follow-up   History of Present Illness   Diagnosis: Type 2 DIABETES MELITUS, date of diagnosis:  1999     Previous history: He has previously been treated with metformin and Victoza and subsequently mealtime insulin added to control postprandial hyperglycemia Overall he has been  relatively noncompliant with his diet, medications, monitoring and followup Previously would not take Victoza regularly because of cost. Has not been taking any medications for the last year and a half because of commitments to family and cost A1c had increased to 12.3% and hyperglycemia discovered when he was hospitalized for foot ulcer. Also lost 20 pounds because of hyperglycemia   Recent history:   Insulin regimen: Toujeo: 17 units at bedtime qd. Humalog 0-10 units at breakfast,  None at lunch,10/15 units suppertime      His blood sugars overall have been poorly controlled with A1c 8.2 in 5/16 He was given a co-pay card to get Toujeo and he has just done this but apparently this is not covered by insurance    Current blood sugar patterns and problems identified.   He was taking 15 units of basal insulin, recently Toujeo and previously Lantus and only in the last few days started taking 17 as directed  He thinks his blood sugars may have been higher in the morning until recently both from taking inadequate insulin dose and also possibly using an old supply of Lantus  Fasting blood sugars are the highest of the day overall  He says that he is not taking Humalog at breakfast as his blood sugars don't go usually go up after breakfast but is trying to take at lunch  His blood sugars are generally better after lunch with only a couple of mildly increased readings based on his diet  Usually not checking sugars after supper and not clear if his postprandial readings are controlled  Occasionally will forget to take his insulin at  suppertime and last night took the Humalog late after eating and this caused hypoglycemia at bedtime  No exercise recently because of his foot problem  Fructosamine is upper normal, indicating somewhat better control   He has not been taking any oral hypoglycemic drugs at the same time.      Proper timing of injections in relation to meals:  usually   Oral hypoglycemic drugs: None        Side effects from medications: None       Monitors blood glucose: Rarely now.  Glucometer: One Touch.          Blood Glucose readings:   Mean values apply above for all meters except median for One Touch  PRE-MEAL Fasting Lunch Dinner Bedtime Overall  Glucose range:  106-231       Mean/median:  183      139    POST-MEAL PC Breakfast PC Lunch PC Dinner  Glucose range:  106-169   85-189   74-186   Mean/median:      Hypoglycemia: Once as above    Meals:  usually 2 meals per day usually.  breakfast at 12 noon, evening meal 7 pm         Physical activity: exercise: None               Weight control:   Wt Readings from Last 3 Encounters:  02/04/15 205 lb 6.4 oz (93.169 kg)  12/20/14 210 lb 9.6 oz (  95.528 kg)  12/08/14 212 lb 8 oz (96.389 kg)           Diabetes labs:  Lab Results  Component Value Date   HGBA1C 8.2* 12/15/2014   HGBA1C 7.1* 09/15/2014   HGBA1C 6.9* 07/21/2014   Lab Results  Component Value Date   MICROALBUR 0.8 04/23/2014   LDLCALC 95 07/13/2014   CREATININE 0.83 01/28/2015   No visits with results within 1 Week(s) from this visit. Latest known visit with results is:  Lab on 01/28/2015  Component Date Value Ref Range Status  . Fructosamine 01/28/2015 277  0 - 285 umol/L Final   Comment: Published reference interval for apparently healthy subjects between age 69 and 31 is 64 - 285 umol/L and in a poorly controlled diabetic population is 228 - 563 umol/L with a mean of 396 umol/L.   Marland Kitchen Sodium 01/28/2015 135  135 - 145 mEq/L Final  . Potassium 01/28/2015 4.0  3.5  - 5.1 mEq/L Final  . Chloride 01/28/2015 100  96 - 112 mEq/L Final  . CO2 01/28/2015 30  19 - 32 mEq/L Final  . Glucose, Bld 01/28/2015 206* 70 - 99 mg/dL Final  . BUN 25/85/2778 12  6 - 23 mg/dL Final  . Creatinine, Ser 01/28/2015 0.83  0.40 - 1.50 mg/dL Final  . Calcium 24/23/5361 9.3  8.4 - 10.5 mg/dL Final  . GFR 44/31/5400 103.28  >60.00 mL/min Final       Medication List       This list is accurate as of: 02/04/15 11:59 PM.  Always use your most recent med list.               bag balm Oint ointment  Apply 1 application topically as needed for dry skin.     cholecalciferol 1000 UNITS tablet  Commonly known as:  VITAMIN D  Take 1,000 Units by mouth daily.     doxycycline 100 MG capsule  Commonly known as:  VIBRAMYCIN     gabapentin 300 MG capsule  Commonly known as:  NEURONTIN  Take 1 capsule (300 mg total) by mouth 3 (three) times daily.     HYDROcodone-acetaminophen 5-325 MG per tablet  Commonly known as:  NORCO/VICODIN     insulin lispro 100 UNIT/ML KiwkPen  Commonly known as:  HUMALOG KWIKPEN  Inject 7-10 units 2 times a day     methocarbamol 500 MG tablet  Commonly known as:  ROBAXIN  Take 1 tablet (500 mg total) by mouth 4 (four) times daily.     mupirocin ointment 2 %  Commonly known as:  BACTROBAN  Apply 1 application topically daily. Applied to affected heal     NOVOLIN R RELION IJ  Inject 12-15 Units as directed.     pravastatin 40 MG tablet  Commonly known as:  PRAVACHOL  TAKE ONE TABLET BY MOUTH ONCE DAILY     RELION INSULIN SYR 0.3ML/31G 31G X 5/16" 0.3 ML Misc  Generic drug:  Insulin Syringe-Needle U-100     TOUJEO SOLOSTAR 300 UNIT/ML Sopn  Generic drug:  Insulin Glargine  Inject 17 Units into the skin at bedtime.     insulin glargine 100 UNIT/ML injection  Commonly known as:  LANTUS  Inject 0.12 mLs (12 Units total) into the skin every other day.        Allergies: No Known Allergies  Past Medical History  Diagnosis Date  .  Sebaceous cyst     on back of neck  . Diabetes  mellitus     type II  . Pulmonary embolism   . Deep vein thrombosis (DVT)   . Arthritis     bilateral hips  . Diabetic ulcer of heel     Right heel  . DJD (degenerative joint disease)   . Pulmonary emboli 02/08/2014    Date of diagnosis February 08 2014, on chest CTA Hospitalized for 3 days Had some symptoms of shortness of breath, and chest pain With intercurrent DVT of the left LE. Duration of anticoagulation: 8 months. End date 10/11/2014.  Anticoagulant: Lovenox 120 units daily Switched to Eliquis on 05/25/2014 per patient preference     Past Surgical History  Procedure Laterality Date  . Rotator cuff repair  2005 (approx)    right   . Closed reduction with humer pin insertion  1974    left hip  . Joint replacement  2006 (approx)    right hip replaced  . 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 Hardware removal Left 07/21/2014    Procedure: HARDWARE REMOVAL;  Surgeon: Loreta Ave, MD;  Location: [facility] OR;  Service: Orthopedics;  Laterality: Left;    Family History  Problem Relation Age of Onset  . Cancer Mother     breast, colon, liver  . Diabetes Father     Social History:  reports that he has never smoked. He has never used smokeless tobacco. He reports that he drinks alcohol. He reports that he does not use illicit drugs.  Review of Systems:  Hypertension:  not present   Lipids: He is on Pravachol with good control of LDL as of  12/15    Lab Results  Component Value Date   CHOL 164 07/13/2014   HDL 57.80 07/13/2014   LDLCALC 95 07/13/2014   TRIG 57.0 07/13/2014   CHOLHDL 3 07/13/2014   Again has recent problem with foot ulcer and is seeing the orthopedic doctor consistently   He still has some tingling and numbness  in his feet and taking gabapentin for this with some relief      Examination:   BP 130/84 mmHg  Pulse 86  Temp(Src) 98.2 F (36.8 C)  Resp 16  Ht 6' (1.829 m)  Wt 205 lb 6.4 oz (93.169 kg)  BMI 27.85 kg/m2  SpO2 95%  Body mass index is 27.85 kg/(m^2).      ASSESSMENT/ PLAN:    Diabetes type 2:  Blood glucose control has been somewhat inconsistent with continued difficulties with compliance with his insulin and affordability He is not concerned that because of his being laid off from work he will not be able to afford insulin See history of present illness for detailed discussion of his current management, blood sugar patterns and problems identified  Recently has increased his basal insulin slightly and fasting readings appear to be coming down with 17 units of Toujeo which he will continue He may go back to Lantus since he does not have coverage for Toujeo He also needs to do better with consistent coverage of all his meals especially when eating carbohydrate and this was discussed, currently needing relatively more Humalog than before He will follow-up in 3 months with A1c   Patient Instructions  Check blood sugars on waking up .Marland Kitchen 3-4 .Marland Kitchen times a week Also  check blood sugars about 2 hours after a meal and do this after different meals by rotation  Recommended blood sugar levels on waking up is 90-130 and about 2 hours after meal is 140-180 Please bring blood sugar monitor to each visit.  Take Regular insulin with all meals       Maisyn Nouri 02/06/2015, 3:38 PM   Note: This office note was prepared with Dragon voice recognition system technology. Any transcriptional errors that result from this process are unintentional.

## 2015-02-07 ENCOUNTER — Other Ambulatory Visit: Payer: Self-pay | Admitting: *Deleted

## 2015-02-07 MED ORDER — INSULIN GLARGINE 100 UNIT/ML SOLOSTAR PEN: PEN_INJECTOR | SUBCUTANEOUS | Status: DC

## 2015-02-16 ENCOUNTER — Other Ambulatory Visit: Payer: Managed Care, Other (non HMO)

## 2015-02-21 ENCOUNTER — Ambulatory Visit: Payer: Managed Care, Other (non HMO) | Admitting: Endocrinology

## 2015-05-04 ENCOUNTER — Other Ambulatory Visit: Payer: Managed Care, Other (non HMO)

## 2015-05-09 ENCOUNTER — Ambulatory Visit: Payer: Managed Care, Other (non HMO) | Admitting: Endocrinology

## 2015-05-12 ENCOUNTER — Ambulatory Visit (INDEPENDENT_AMBULATORY_CARE_PROVIDER_SITE_OTHER): Payer: Medicaid Other | Admitting: Endocrinology

## 2015-05-12 ENCOUNTER — Encounter: Payer: Self-pay | Admitting: Endocrinology

## 2015-05-12 VITALS — BP 132/82 | HR 113 | Temp 98.3°F | Resp 16 | Ht 72.0 in | Wt 189.8 lb

## 2015-05-12 DIAGNOSIS — E118 Type 2 diabetes mellitus with unspecified complications: Secondary | ICD-10-CM | POA: Diagnosis not present

## 2015-05-12 DIAGNOSIS — E1165 Type 2 diabetes mellitus with hyperglycemia: Secondary | ICD-10-CM

## 2015-05-12 MED ORDER — TADALAFIL 20 MG PO TABS: 20.0000 mg | ORAL_TABLET | Freq: Every day | ORAL | Status: DC | PRN

## 2015-05-12 NOTE — Progress Notes (Signed)
Gregory May 54 y.o.           Reason for Appointment: Diabetes follow-up   History of Present Illness   Diagnosis: Type 2 DIABETES MELITUS, date of diagnosis:  1999     Previous history: He has previously been treated with metformin and Victoza and subsequently mealtime insulin added to control postprandial hyperglycemia Overall he has been  relatively noncompliant with his diet, medications, monitoring and followup Previously would not take Victoza regularly because of cost. Has not been taking any medications for the last year and a half because of commitments to family and cost A1c had increased to 12.3% and hyperglycemia discovered when he was hospitalized for foot ulcer. Also lost 20 pounds because of hyperglycemia   Recent history:   Insulin regimen: Toujeo: 17 units at supper.  Regular 15-20 units at breakfast and ac or pc suppertime      His blood sugars overall have been poorly controlled with A1c 8.6 Because of his lack of insurance he has been probably inconsistent with his self-care and glucose monitoring He thinks he is still taking his Toujeo as directed but has switched from Novolog 2 Regular Insulin OTC    Current blood sugar patterns and problems identified.   He says he is taking his regular insulin right before eating instead of 15-30 minutes before  Although his blood sugars are near or just before his first meal he periodically has high readings after meals  He is eating some of his meals in the evening at a friend's house and will not take his insulin before he eats  No hypoglycemia also   He has not been taking any oral hypoglycemic drugs at the same time.      Proper timing of injections in relation to meals:  usually   Oral hypoglycemic drugs: None        Side effects from medications: None       Monitors blood glucose: Rarely now.  Glucometer: One Touch.          Blood Glucose readings:   Mean values apply above for all meters except median  for One Touch  PRE-MEAL Fasting Lunch Dinner Bedtime Overall  Glucose range:  119-188    132   231, 235    Mean/median:  135      137     Meals:  usually 2 meals per day usually.  breakfast at 12 noon, evening meal 7 pm         Physical activity: exercise: None               Weight control:   Wt Readings from Last 3 Encounters:  05/12/15 189 lb 12.8 oz (86.093 kg)  02/04/15 205 lb 6.4 oz (93.169 kg)  12/20/14 210 lb 9.6 oz (95.528 kg)           Diabetes labs:  Lab Results  Component Value Date   HGBA1C 8.2* 12/15/2014   HGBA1C 7.1* 09/15/2014   HGBA1C 6.9* 07/21/2014   Lab Results  Component Value Date   MICROALBUR 0.8 04/23/2014   LDLCALC 95 07/13/2014   CREATININE 0.83 01/28/2015   No visits with results within 1 Week(s) from this visit. Latest known visit with results is:  Lab on 01/28/2015  Component Date Value Ref Range Status  . Fructosamine 01/28/2015 277  0 - 285 umol/L Final   Comment: Published reference interval for apparently healthy subjects between age 49 and 69 is 50 - 285 umol/L and  in a poorly controlled diabetic population is 228 - 563 umol/L with a mean of 396 umol/L.   Marland Kitchen Sodium 01/28/2015 135  135 - 145 mEq/L Final  . Potassium 01/28/2015 4.0  3.5 - 5.1 mEq/L Final  . Chloride 01/28/2015 100  96 - 112 mEq/L Final  . CO2 01/28/2015 30  19 - 32 mEq/L Final  . Glucose, Bld 01/28/2015 206* 70 - 99 mg/dL Final  . BUN 16/05/9603 12  6 - 23 mg/dL Final  . Creatinine, Ser 01/28/2015 0.83  0.40 - 1.50 mg/dL Final  . Calcium 54/04/8118 9.3  8.4 - 10.5 mg/dL Final  . GFR 14/78/2956 103.28  >60.00 mL/min Final       Medication List       This list is accurate as of: 05/12/15  4:30 PM.  Always use your most recent med list.               bag balm Oint ointment  Apply 1 application topically as needed for dry skin.     cholecalciferol 1000 UNITS tablet  Commonly known as:  VITAMIN D  Take 1,000 Units by mouth daily.     doxycycline 100 MG  capsule  Commonly known as:  VIBRAMYCIN     gabapentin 300 MG capsule  Commonly known as:  NEURONTIN  Take 1 capsule (300 mg total) by mouth 3 (three) times daily.     HYDROcodone-acetaminophen 5-325 MG tablet  Commonly known as:  NORCO/VICODIN     mupirocin ointment 2 %  Commonly known as:  BACTROBAN  Apply 1 application topically daily. Applied to affected heal     NOVOLIN R RELION IJ  Inject 12-15 Units as directed.     pravastatin 40 MG tablet  Commonly known as:  PRAVACHOL  TAKE ONE TABLET BY MOUTH ONCE DAILY     RELION INSULIN SYR 0.3ML/31G 31G X 5/16" 0.3 ML Misc  Generic drug:  Insulin Syringe-Needle U-100     tadalafil 20 MG tablet  Commonly known as:  CIALIS  Take 1 tablet (20 mg total) by mouth daily as needed for erectile dysfunction.     TOUJEO SOLOSTAR 300 UNIT/ML Sopn  Generic drug:  Insulin Glargine  Inject 17 Units into the skin at bedtime.        Allergies: No Known Allergies  Past Medical History  Diagnosis Date  . Sebaceous cyst     on back of neck  . Diabetes mellitus     type II  . Pulmonary embolism   . Deep vein thrombosis (DVT)   . Arthritis     bilateral hips  . Diabetic ulcer of heel     Right heel  . DJD (degenerative joint disease)   . Pulmonary emboli 02/08/2014    Date of diagnosis February 08 2014, on chest CTA Hospitalized for 3 days Had some symptoms of shortness of breath, and chest pain With intercurrent DVT of the left LE. Duration of anticoagulation: 8 months. End date 10/11/2014.  Anticoagulant: Lovenox 120 units daily Switched to Eliquis on 05/25/2014 per patient preference     Past Surgical History  Procedure Laterality Date  . Rotator cuff repair  2005 (approx)    right   . Closed reduction with humer pin insertion  1974    left hip  . Joint replacement  2006 (approx)    right hip replaced  . 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 Hardware removal Left 07/21/2014    Procedure: HARDWARE REMOVAL;  Surgeon: Loreta Ave, MD;  Location: [facility] OR;  Service: Orthopedics;  Laterality: Left;    Family History  Problem Relation Age of Onset  . Cancer Mother     breast, colon, liver  . Diabetes Father     Social History:  reports that he has never smoked. He has never used smokeless tobacco. He reports that he drinks alcohol. He reports that he does not use illicit drugs.  Review of Systems:   Lipids: He is on Pravachol with good control of LDL as of  12/15    Lab Results  Component Value Date   CHOL 164 07/13/2014   HDL 57.80 07/13/2014   LDLCALC 95 07/13/2014   TRIG 57.0 07/13/2014   CHOLHDL 3 07/13/2014   Again has recent problem with foot ulcer and is seeing the wound center, getting hyperbaric treatment  He still has some tingling and numbness in his feet and taking gabapentin for this with some relief      Examination:   BP 132/82 mmHg  Pulse 113  Temp(Src) 98.3 F (36.8 C)  Resp 16  Ht 6' (1.829 m)  Wt 189 lb 12.8 oz (86.093 kg)  BMI 25.74 kg/m2  SpO2 96%  Body mass index is 25.74 kg/(m^2).      ASSESSMENT/ PLAN:     Diabetes type 2:  Blood glucose control has been poor and A1c is 8.6, now consistently high He still is not able to control his blood sugars partly because of infrequent monitoring, not taking mealtime insulin as directed and not adjusting his basal insulin to get fasting readings consistently controlled  See history of present illness for detailed discussion of his current management, blood sugar patterns and problems identified  Have recommended increasing his blood sugar monitoring and avoiding expired test strips which he is using He needs to take his Regular Insulin 15-30 minutes before eating consistently Discussed that he needs to adjust his  mealtime coverage based on how his postprandial readings are Will need at least 2-3 units more on the basal insulin for now  He will follow-up in 3 months with A1c  Continue follow-up with PCP and other physicians for various other problems   Patient Instructions  Toujeo 20 units at supper daily  Must take Regular insulin 15-30 min before eating  Check more sugars 2 hrs after meals  Check blood sugars on waking up ..3  .. times a week Also check blood sugars about 2 hours after a meal and do this after different meals by rotation  Recommended blood sugar levels on waking up is 80-120 and about 2 hours after meal is 140-180 Please bring blood sugar monitor to each visit.       KUMAR,AJAY 05/12/2015, 4:30 PM   Note: This office note was prepared with Dragon voice recognition system technology. Any transcriptional errors that result from this process are unintentional.

## 2015-05-12 NOTE — Patient Instructions (Addendum)
Toujeo 20 units at supper daily  Must take Regular insulin 15-30 min before eating  Check more sugars 2 hrs after meals  Check blood sugars on waking up ..3  .. times a week Also check blood sugars about 2 hours after a meal and do this after different meals by rotation  Recommended blood sugar levels on waking up is 80-120 and about 2 hours after meal is 140-180 Please bring blood sugar monitor to each visit.

## 2015-06-01 ENCOUNTER — Encounter (HOSPITAL_COMMUNITY): Payer: Self-pay | Admitting: *Deleted

## 2015-06-01 ENCOUNTER — Emergency Department (HOSPITAL_COMMUNITY): Payer: Medicaid Other

## 2015-06-01 ENCOUNTER — Inpatient Hospital Stay (HOSPITAL_COMMUNITY)
Admission: EM | Admit: 2015-06-01 | Discharge: 2015-06-07 | DRG: 617 | Disposition: A | Discharge status: Rehabilitation Facility | Payer: Medicaid Other | Attending: Internal Medicine | Admitting: Internal Medicine

## 2015-06-01 DIAGNOSIS — Z89519 Acquired absence of unspecified leg below knee: Secondary | ICD-10-CM | POA: Diagnosis not present

## 2015-06-01 DIAGNOSIS — M1710 Unilateral primary osteoarthritis, unspecified knee: Secondary | ICD-10-CM | POA: Diagnosis present

## 2015-06-01 DIAGNOSIS — I2699 Other pulmonary embolism without acute cor pulmonale: Secondary | ICD-10-CM | POA: Diagnosis present

## 2015-06-01 DIAGNOSIS — Z833 Family history of diabetes mellitus: Secondary | ICD-10-CM | POA: Diagnosis not present

## 2015-06-01 DIAGNOSIS — Z86718 Personal history of other venous thrombosis and embolism: Secondary | ICD-10-CM | POA: Diagnosis not present

## 2015-06-01 DIAGNOSIS — M160 Bilateral primary osteoarthritis of hip: Secondary | ICD-10-CM | POA: Diagnosis present

## 2015-06-01 DIAGNOSIS — Z794 Long term (current) use of insulin: Secondary | ICD-10-CM

## 2015-06-01 DIAGNOSIS — E785 Hyperlipidemia, unspecified: Secondary | ICD-10-CM | POA: Diagnosis present

## 2015-06-01 DIAGNOSIS — E11621 Type 2 diabetes mellitus with foot ulcer: Secondary | ICD-10-CM | POA: Diagnosis present

## 2015-06-01 DIAGNOSIS — L089 Local infection of the skin and subcutaneous tissue, unspecified: Secondary | ICD-10-CM | POA: Diagnosis not present

## 2015-06-01 DIAGNOSIS — M86279 Subacute osteomyelitis, unspecified ankle and foot: Secondary | ICD-10-CM

## 2015-06-01 DIAGNOSIS — G546 Phantom limb syndrome with pain: Secondary | ICD-10-CM | POA: Diagnosis not present

## 2015-06-01 DIAGNOSIS — M25561 Pain in right knee: Secondary | ICD-10-CM | POA: Diagnosis not present

## 2015-06-01 DIAGNOSIS — M86271 Subacute osteomyelitis, right ankle and foot: Secondary | ICD-10-CM | POA: Diagnosis not present

## 2015-06-01 DIAGNOSIS — E1142 Type 2 diabetes mellitus with diabetic polyneuropathy: Secondary | ICD-10-CM

## 2015-06-01 DIAGNOSIS — S8001XS Contusion of right knee, sequela: Secondary | ICD-10-CM | POA: Diagnosis not present

## 2015-06-01 DIAGNOSIS — F1410 Cocaine abuse, uncomplicated: Secondary | ICD-10-CM | POA: Diagnosis present

## 2015-06-01 DIAGNOSIS — E7800 Pure hypercholesterolemia, unspecified: Secondary | ICD-10-CM | POA: Diagnosis present

## 2015-06-01 DIAGNOSIS — S8001XD Contusion of right knee, subsequent encounter: Secondary | ICD-10-CM | POA: Diagnosis not present

## 2015-06-01 DIAGNOSIS — M86671 Other chronic osteomyelitis, right ankle and foot: Secondary | ICD-10-CM | POA: Diagnosis present

## 2015-06-01 DIAGNOSIS — K5900 Constipation, unspecified: Secondary | ICD-10-CM | POA: Diagnosis present

## 2015-06-01 DIAGNOSIS — Z89511 Acquired absence of right leg below knee: Secondary | ICD-10-CM | POA: Diagnosis not present

## 2015-06-01 DIAGNOSIS — Z79899 Other long term (current) drug therapy: Secondary | ICD-10-CM

## 2015-06-01 DIAGNOSIS — E118 Type 2 diabetes mellitus with unspecified complications: Secondary | ICD-10-CM | POA: Diagnosis not present

## 2015-06-01 DIAGNOSIS — E11628 Type 2 diabetes mellitus with other skin complications: Secondary | ICD-10-CM | POA: Diagnosis present

## 2015-06-01 DIAGNOSIS — I82409 Acute embolism and thrombosis of unspecified deep veins of unspecified lower extremity: Secondary | ICD-10-CM | POA: Diagnosis present

## 2015-06-01 DIAGNOSIS — G8918 Other acute postprocedural pain: Secondary | ICD-10-CM | POA: Diagnosis not present

## 2015-06-01 DIAGNOSIS — L97414 Non-pressure chronic ulcer of right heel and midfoot with necrosis of bone: Secondary | ICD-10-CM | POA: Diagnosis present

## 2015-06-01 DIAGNOSIS — E1165 Type 2 diabetes mellitus with hyperglycemia: Secondary | ICD-10-CM | POA: Diagnosis present

## 2015-06-01 DIAGNOSIS — Z01818 Encounter for other preprocedural examination: Secondary | ICD-10-CM

## 2015-06-01 DIAGNOSIS — R269 Unspecified abnormalities of gait and mobility: Secondary | ICD-10-CM | POA: Diagnosis not present

## 2015-06-01 DIAGNOSIS — Z86711 Personal history of pulmonary embolism: Secondary | ICD-10-CM | POA: Diagnosis not present

## 2015-06-01 DIAGNOSIS — L97509 Non-pressure chronic ulcer of other part of unspecified foot with unspecified severity: Secondary | ICD-10-CM

## 2015-06-01 DIAGNOSIS — M869 Osteomyelitis, unspecified: Secondary | ICD-10-CM | POA: Diagnosis present

## 2015-06-01 DIAGNOSIS — L03115 Cellulitis of right lower limb: Secondary | ICD-10-CM

## 2015-06-01 DIAGNOSIS — M179 Osteoarthritis of knee, unspecified: Secondary | ICD-10-CM | POA: Diagnosis present

## 2015-06-01 DIAGNOSIS — E1140 Type 2 diabetes mellitus with diabetic neuropathy, unspecified: Secondary | ICD-10-CM | POA: Diagnosis not present

## 2015-06-01 LAB — URINALYSIS, ROUTINE W REFLEX MICROSCOPIC
Bilirubin Urine: NEGATIVE
Glucose, UA: >1000 mg/dL — AB
Hgb urine dipstick: NEGATIVE
Ketones, ur: NEGATIVE mg/dL
Leukocytes, UA: NEGATIVE
Nitrite: NEGATIVE
Protein, ur: NEGATIVE mg/dL
Specific Gravity, Urine: 1.017 (ref 1.005–1.030)
Urobilinogen, UA: 1 mg/dL (ref 0.0–1.0)
pH: 6 (ref 5.0–8.0)

## 2015-06-01 LAB — CBC WITH DIFFERENTIAL/PLATELET
Basophils Absolute: 0 10*3/uL (ref 0.0–0.1)
Basophils Relative: 0 %
Eosinophils Absolute: 0.1 10*3/uL (ref 0.0–0.7)
Eosinophils Relative: 1 %
HCT: 42.1 % (ref 39.0–52.0)
Hemoglobin: 14 g/dL (ref 13.0–17.0)
Lymphocytes Relative: 16 %
Lymphs Abs: 2.4 10*3/uL (ref 0.7–4.0)
MCH: 30.6 pg (ref 26.0–34.0)
MCHC: 33.3 g/dL (ref 30.0–36.0)
MCV: 91.9 fL (ref 78.0–100.0)
Monocytes Absolute: 1.8 10*3/uL — ABNORMAL HIGH (ref 0.1–1.0)
Monocytes Relative: 12 %
Neutro Abs: 10.2 10*3/uL — ABNORMAL HIGH (ref 1.7–7.7)
Neutrophils Relative %: 71 %
Platelets: 385 10*3/uL (ref 150–400)
RBC: 4.58 MIL/uL (ref 4.22–5.81)
RDW: 13.7 % (ref 11.5–15.5)
WBC: 14.5 10*3/uL — ABNORMAL HIGH (ref 4.0–10.5)

## 2015-06-01 LAB — TYPE AND SCREEN
ABO/RH(D): A POS
Antibody Screen: NEGATIVE

## 2015-06-01 LAB — COMPREHENSIVE METABOLIC PANEL WITH GFR
ALT: 48 U/L (ref 17–63)
AST: 35 U/L (ref 15–41)
Albumin: 3.3 g/dL — ABNORMAL LOW (ref 3.5–5.0)
Alkaline Phosphatase: 78 U/L (ref 38–126)
Anion gap: 7 (ref 5–15)
BUN: 13 mg/dL (ref 6–20)
CO2: 25 mmol/L (ref 22–32)
Calcium: 8.9 mg/dL (ref 8.9–10.3)
Chloride: 103 mmol/L (ref 101–111)
Creatinine, Ser: 0.8 mg/dL (ref 0.61–1.24)
GFR calc Af Amer: >60 mL/min
GFR calc non Af Amer: >60 mL/min
Glucose, Bld: 215 mg/dL — ABNORMAL HIGH (ref 65–99)
Potassium: 4 mmol/L (ref 3.5–5.1)
Sodium: 135 mmol/L (ref 135–145)
Total Bilirubin: 0.4 mg/dL (ref 0.3–1.2)
Total Protein: 7.5 g/dL (ref 6.5–8.1)

## 2015-06-01 LAB — I-STAT CG4 LACTIC ACID, ED
Lactic Acid, Venous: 1.25 mmol/L (ref 0.5–2.0)
Lactic Acid, Venous: 0.7 mmol/L (ref 0.5–2.0)

## 2015-06-01 LAB — URINE MICROSCOPIC-ADD ON

## 2015-06-01 LAB — PROTIME-INR
INR: 1.11 (ref 0.00–1.49)
Prothrombin Time: 14.5 seconds (ref 11.6–15.2)

## 2015-06-01 LAB — APTT: aPTT: 28 seconds (ref 24–37)

## 2015-06-01 LAB — SEDIMENTATION RATE: Sed Rate: 56 mm/hr — ABNORMAL HIGH (ref 0–16)

## 2015-06-01 LAB — GLUCOSE, CAPILLARY: Glucose-Capillary: 200 mg/dL — ABNORMAL HIGH (ref 65–99)

## 2015-06-01 LAB — CBG MONITORING, ED: Glucose-Capillary: 124 mg/dL — ABNORMAL HIGH (ref 65–99)

## 2015-06-01 MED ORDER — INFLUENZA VAC SPLIT QUAD 0.5 ML IM SUSY: 0.5000 mL | PREFILLED_SYRINGE | INTRAMUSCULAR | Status: DC

## 2015-06-01 MED ORDER — PIPERACILLIN-TAZOBACTAM 3.375 G IVPB 30 MIN: 3.3750 g | Freq: Three times a day (TID) | INTRAVENOUS | Status: DC

## 2015-06-01 MED ORDER — VITAMIN D 1000 UNITS PO TABS
1000.0000 [IU] | ORAL_TABLET | Freq: Every day | ORAL | Status: DC
  Administered 2015-06-01 – 2015-06-07 (×7): 1000 [IU] via ORAL
  Filled 2015-06-01 (×8): qty 1

## 2015-06-01 MED ORDER — HYDROMORPHONE HCL 1 MG/ML IJ SOLN
1.0000 mg | Freq: Once | INTRAMUSCULAR | Status: AC
  Administered 2015-06-01: 1 mg via INTRAVENOUS
  Filled 2015-06-01: qty 1

## 2015-06-01 MED ORDER — ACETAMINOPHEN 325 MG PO TABS
650.0000 mg | ORAL_TABLET | Freq: Four times a day (QID) | ORAL | Status: DC | PRN
Start: 2015-06-01 — End: 2015-06-07

## 2015-06-01 MED ORDER — HEPARIN SODIUM (PORCINE) 5000 UNIT/ML IJ SOLN
5000.0000 [IU] | Freq: Three times a day (TID) | INTRAMUSCULAR | Status: DC
  Administered 2015-06-01 – 2015-06-02 (×2): 5000 [IU] via SUBCUTANEOUS
  Filled 2015-06-01 (×5): qty 1

## 2015-06-01 MED ORDER — SODIUM CHLORIDE 0.9 % IV SOLN
INTRAVENOUS | Status: DC
  Administered 2015-06-01: 15:00:00 via INTRAVENOUS

## 2015-06-01 MED ORDER — PIPERACILLIN-TAZOBACTAM 3.375 G IVPB 30 MIN
3.3750 g | Freq: Once | INTRAVENOUS | Status: AC
  Administered 2015-06-01: 3.375 g via INTRAVENOUS
  Filled 2015-06-01: qty 50

## 2015-06-01 MED ORDER — BAG BALM OINTMENT
1.0000 "application " | TOPICAL_OINTMENT | CUTANEOUS | Status: DC | PRN
  Filled 2015-06-01: qty 300

## 2015-06-01 MED ORDER — ACETAMINOPHEN 650 MG RE SUPP
650.0000 mg | Freq: Four times a day (QID) | RECTAL | Status: DC | PRN
Start: 2015-06-01 — End: 2015-06-06

## 2015-06-01 MED ORDER — OXYCODONE-ACETAMINOPHEN 5-325 MG PO TABS
2.0000 | ORAL_TABLET | Freq: Four times a day (QID) | ORAL | Status: DC | PRN
  Administered 2015-06-01 – 2015-06-05 (×7): 2 via ORAL
  Filled 2015-06-01 (×7): qty 2

## 2015-06-01 MED ORDER — INSULIN ASPART 100 UNIT/ML ~~LOC~~ SOLN
0.0000 [IU] | Freq: Three times a day (TID) | SUBCUTANEOUS | Status: DC
  Administered 2015-06-02: 2 [IU] via SUBCUTANEOUS
  Administered 2015-06-02: 3 [IU] via SUBCUTANEOUS
  Administered 2015-06-02: 2 [IU] via SUBCUTANEOUS
  Administered 2015-06-03 – 2015-06-04 (×2): 5 [IU] via SUBCUTANEOUS
  Administered 2015-06-04 (×2): 2 [IU] via SUBCUTANEOUS
  Administered 2015-06-05 – 2015-06-06 (×4): 3 [IU] via SUBCUTANEOUS
  Administered 2015-06-06: 2 [IU] via SUBCUTANEOUS
  Administered 2015-06-06: 3 [IU] via SUBCUTANEOUS
  Administered 2015-06-07: 2 [IU] via SUBCUTANEOUS
  Administered 2015-06-07: 3 [IU] via SUBCUTANEOUS

## 2015-06-01 MED ORDER — MORPHINE SULFATE (PF) 2 MG/ML IV SOLN
2.0000 mg | INTRAVENOUS | Status: DC | PRN
  Administered 2015-06-01 – 2015-06-03 (×6): 2 mg via INTRAVENOUS
  Filled 2015-06-01 (×6): qty 1

## 2015-06-01 MED ORDER — GABAPENTIN 300 MG PO CAPS
300.0000 mg | ORAL_CAPSULE | Freq: Three times a day (TID) | ORAL | Status: DC
  Administered 2015-06-01 – 2015-06-07 (×16): 300 mg via ORAL
  Filled 2015-06-01 (×16): qty 1

## 2015-06-01 MED ORDER — INSULIN GLARGINE 300 UNIT/ML ~~LOC~~ SOPN: 20.0000 [IU] | PEN_INJECTOR | Freq: Every day | SUBCUTANEOUS | Status: DC

## 2015-06-01 MED ORDER — ALUM & MAG HYDROXIDE-SIMETH 200-200-20 MG/5ML PO SUSP: 30.0000 mL | Freq: Four times a day (QID) | ORAL | Status: DC | PRN

## 2015-06-01 MED ORDER — SODIUM CHLORIDE 0.9 % IV SOLN: INTRAVENOUS | Status: DC

## 2015-06-01 MED ORDER — VANCOMYCIN HCL IN DEXTROSE 1-5 GM/200ML-% IV SOLN
1000.0000 mg | Freq: Once | INTRAVENOUS | Status: AC
  Administered 2015-06-01: 1000 mg via INTRAVENOUS
  Filled 2015-06-01: qty 200

## 2015-06-01 MED ORDER — PRAVASTATIN SODIUM 40 MG PO TABS
40.0000 mg | ORAL_TABLET | Freq: Every day | ORAL | Status: DC
  Administered 2015-06-01 – 2015-06-07 (×7): 40 mg via ORAL
  Filled 2015-06-01 (×8): qty 1

## 2015-06-01 MED ORDER — INSULIN GLARGINE 100 UNIT/ML ~~LOC~~ SOLN
20.0000 [IU] | Freq: Every day | SUBCUTANEOUS | Status: DC
  Administered 2015-06-01 – 2015-06-03 (×3): 20 [IU] via SUBCUTANEOUS
  Filled 2015-06-01 (×5): qty 0.2

## 2015-06-01 MED ORDER — VANCOMYCIN HCL IN DEXTROSE 1-5 GM/200ML-% IV SOLN
1000.0000 mg | Freq: Three times a day (TID) | INTRAVENOUS | Status: DC
  Administered 2015-06-01 – 2015-06-04 (×8): 1000 mg via INTRAVENOUS
  Filled 2015-06-01 (×10): qty 200

## 2015-06-01 MED ORDER — ONDANSETRON HCL 4 MG/2ML IJ SOLN: 4.0000 mg | Freq: Three times a day (TID) | INTRAMUSCULAR | Status: DC | PRN

## 2015-06-01 MED ORDER — SODIUM CHLORIDE 0.9 % IV SOLN
INTRAVENOUS | Status: DC
  Administered 2015-06-01 – 2015-06-02 (×3): via INTRAVENOUS

## 2015-06-01 MED ORDER — PIPERACILLIN-TAZOBACTAM 3.375 G IVPB
3.3750 g | Freq: Three times a day (TID) | INTRAVENOUS | Status: DC
  Administered 2015-06-01 – 2015-06-04 (×8): 3.375 g via INTRAVENOUS
  Filled 2015-06-01 (×10): qty 50

## 2015-06-01 NOTE — ED Notes (Signed)
Patient is alert and oriented x4.  He has been sent by his wound Dr. To be evaluated  For cellulitis of the right foot.  Patient has a history of this wound before and is diabetic.

## 2015-06-01 NOTE — Consult Note (Signed)
Reason for Consult: Osteomyelitis with draining ulcer 8 ascending cellulitis right calcaneus Referring Physician: ER physician  Maaz T Debrosse is an 54 y.o. male.  HPI: Patient is a 54 year old gentleman well known to me with diabetic insensate neuropathy. He recently patient was recommended to proceed with a transtibial amputation secondary to the chronic osteomyelitis of the right calcaneus. Patient went to [facility] and was recommended to proceed with hyperbaric oxygen. Patient underwent continued oral antibiotics hyperbaric oxygen dives 6 with persistent osteomyelitis of the calcaneus now with a sending cellulitis up his leg.  Past Medical History  Diagnosis Date  . Sebaceous cyst     on back of neck  . Diabetes mellitus     type II  . Pulmonary embolism ([place])   . Deep vein thrombosis (DVT) ([place])   . Arthritis     bilateral hips  . 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 DJD (degenerative joint disease)   . Pulmonary emboli ([place]) 02/08/2014    Date of diagnosis February 08 2014, on chest CTA Hospitalized for 3 days Had some symptoms of shortness of breath, and chest pain With intercurrent DVT of the left LE. Duration of anticoagulation: 8 months. End date 10/11/2014.  Anticoagulant: Lovenox 120 units daily Switched to Eliquis on 05/25/2014 per patient preference     Past Surgical History  Procedure Laterality Date  . Rotator cuff repair  2005 (approx)    right   . Closed reduction with humer pin insertion  1974    left hip  . Joint replacement  2006 (approx)    right hip replaced  . 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 Total hip arthroplasty Left 07/21/2014    Procedure: TOTAL HIP ARTHROPLASTY ANTERIOR APPROACH;  Surgeon: Ninetta Lights, MD;  Location: [place];  Service: Orthopedics;  Laterality: Left;  . Hardware removal Left 07/21/2014    Procedure: HARDWARE REMOVAL;  Surgeon: Ninetta Lights, MD;  Location: [place];  Service: Orthopedics;  Laterality:  Left;    Family History  Problem Relation Age of Onset  . Cancer Mother     breast, colon, liver  . Diabetes Father     Social History:  reports that he has never smoked. He has never used smokeless tobacco. He reports that he drinks alcohol. He reports that he does not use illicit drugs.  Allergies: No Known Allergies  Medications: I have reviewed the patient's current medications.  Results for orders placed or performed during the hospital encounter of 06/01/15 (from the past 48 hour(s))  Comprehensive metabolic panel     Status: Abnormal   Collection Time: 06/01/15 11:49 AM  Result Value Ref Range   Sodium 135 135 - 145 mmol/L   Potassium 4.0 3.5 - 5.1 mmol/L   Chloride 103 101 - 111 mmol/L   CO2 25 22 - 32 mmol/L   Glucose, Bld 215 (H) 65 - 99 mg/dL   BUN 13 6 - 20 mg/dL   Creatinine, Ser 0.80 0.61 - 1.24 mg/dL   Calcium 8.9 8.9 - 10.3 mg/dL   Total Protein 7.5 6.5 - 8.1 g/dL   Albumin 3.3 (L) 3.5 - 5.0 g/dL   AST 35 15 - 41 U/L   ALT 48 17 - 63 U/L   Alkaline Phosphatase 78 38 - 126 U/L   Total Bilirubin 0.4 0.3 - 1.2 mg/dL   GFR calc non Af Amer >60 >60 mL/min   GFR calc  Af Amer >60 >60 mL/min    Comment: (NOTE) The eGFR has been calculated using the CKD EPI equation. This calculation has not been validated in all clinical situations. eGFR's persistently <60 mL/min signify possible Chronic Kidney Disease.    Anion gap 7 5 - 15  CBC with Differential     Status: Abnormal   Collection Time: 06/01/15 11:49 AM  Result Value Ref Range   WBC 14.5 (H) 4.0 - 10.5 K/uL   RBC 4.58 4.22 - 5.81 MIL/uL   Hemoglobin 14.0 13.0 - 17.0 g/dL   HCT 42.1 39.0 - 52.0 %   MCV 91.9 78.0 - 100.0 fL   MCH 30.6 26.0 - 34.0 pg   MCHC 33.3 30.0 - 36.0 g/dL   RDW 13.7 11.5 - 15.5 %   Platelets 385 150 - 400 K/uL   Neutrophils Relative % 71 %   Neutro Abs 10.2 (H) 1.7 - 7.7 K/uL   Lymphocytes Relative 16 %   Lymphs Abs 2.4 0.7 - 4.0 K/uL   Monocytes Relative 12 %   Monocytes  Absolute 1.8 (H) 0.1 - 1.0 K/uL   Eosinophils Relative 1 %   Eosinophils Absolute 0.1 0.0 - 0.7 K/uL   Basophils Relative 0 %   Basophils Absolute 0.0 0.0 - 0.1 K/uL  Urinalysis, Routine w reflex microscopic (not at [facility])     Status: Abnormal   Collection Time: 06/01/15 12:00 PM  Result Value Ref Range   Color, Urine YELLOW YELLOW   APPearance CLEAR CLEAR   Specific Gravity, Urine 1.017 1.005 - 1.030   pH 6.0 5.0 - 8.0   Glucose, UA >1000 (A) NEGATIVE mg/dL   Hgb urine dipstick NEGATIVE NEGATIVE   Bilirubin Urine NEGATIVE NEGATIVE   Ketones, ur NEGATIVE NEGATIVE mg/dL   Protein, ur NEGATIVE NEGATIVE mg/dL   Urobilinogen, UA 1.0 0.0 - 1.0 mg/dL   Nitrite NEGATIVE NEGATIVE   Leukocytes, UA NEGATIVE NEGATIVE  I-Stat CG4 Lactic Acid, ED (Not at [facility])     Status: None   Collection Time: 06/01/15 12:00 PM  Result Value Ref Range   Lactic Acid, Venous 1.25 0.5 - 2.0 mmol/L  Urine microscopic-add on     Status: None   Collection Time: 06/01/15 12:00 PM  Result Value Ref Range   WBC, UA 0-2 <3 WBC/hpf  I-Stat CG4 Lactic Acid, ED (Not at [facility])     Status: None   Collection Time: 06/01/15  2:36 PM  Result Value Ref Range   Lactic Acid, Venous 0.70 0.5 - 2.0 mmol/L  CBG monitoring, ED     Status: Abnormal   Collection Time: 06/01/15  5:42 PM  Result Value Ref Range   Glucose-Capillary 124 (H) 65 - 99 mg/dL    Dg Foot Complete Right  06/01/2015  CLINICAL DATA:  Diabetic ulcer plantar past RIGHT foot at calcaneus question osteomyelitis, type II diabetes mellitus, diabetic polyneuropathy EXAM: RIGHT FOOT COMPLETE - 3+ VIEW COMPARISON:  09/15/2013 FINDINGS: Osseous demineralization. Joint spaces preserved. Scattered soft tissue swelling. Small soft tissue wound/ulcer at plantar aspect of calcaneus. Mild cortical irregularity along the plantar aspect of the posterior calcaneus similar to previous exam. No definite acute fracture, dislocation or new bone destruction. Scattered small vessel  vascular calcification. IMPRESSION: Chronic cortical irregularity at plantar aspect of posterior calcaneus little changed from previous exam without definite evidence of acute bone destruction to suggest acute osteomyelitis. If osteomyelitis remains a clinical concern, recommend followup MR imaging. Electronically Signed   By: Crist Infante.D.  On: 06/01/2015 14:53    Review of Systems  All other systems reviewed and are negative.  Blood pressure 126/88, pulse 77, temperature 98.1 F (36.7 C), temperature source Oral, resp. rate 18, SpO2 98 %. Physical Exam On examination patient has a purulent drainage from a ulcer on the right calcaneus. The ulcers 2 cm in diameter approximate 2 cm deep. The bone of the calcaneus can be toe-touch through the wound. Patient has cellulitis up to the medial aspect of his leg just below the patella. Review of the radiographs shows chronic osteomyelitis of the calcaneus. Assessment/Plan: Assessment: Chronic osteomyelitis right calcaneus with ascending cellulitis despite multiple courses of oral antibiotics and hyperbaric oxygen.  Plan: I feel the patient's only option is to proceed with IV antibiotics at this time plan for transtibial amputation on Friday afternoon. Plan for surgery at [facility]. Transferred to [facility] for hospitalist to manage patient's diabetes and antibiotics.  DUDA,MARCUS V 06/01/2015, 7:11 PM

## 2015-06-01 NOTE — ED Provider Notes (Signed)
CSN: [id number]     Arrival date & time 06/01/15  1035 History   First MD Initiated Contact with Patient 06/01/15 1200     Chief Complaint  Patient presents with  . Foot Wound      (Consider location/radiation/quality/duration/timing/severity/associated sxs/prior Treatment) HPI Patient has been dealing with a foot ulcer since February. He was initially being treated at wound care and tried 6 cycles of hyperbaric therapy. He has been on recurrent outpatient antibiotics. Most recently he had treatment with Keflex and Bactrim. Since yesterday however the foot has started to swell and get red. He reports now he has some redness streaking of the front of his leg. Patient denies he's had a fever. He denies general constitutional symptoms. He reports the pain has increased. Past Medical History  Diagnosis Date  . Sebaceous cyst     on back of neck  . Diabetes mellitus     type II  . Pulmonary embolism (HCC)   . Deep vein thrombosis (DVT) (HCC)   . Arthritis     bilateral hips  . 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 DJD (degenerative joint disease)   . Pulmonary emboli (HCC) 02/08/2014    Date of diagnosis February 08 2014, on chest CTA Hospitalized for 3 days Had some symptoms of shortness of breath, and chest pain With intercurrent DVT of the left LE. Duration of anticoagulation: 8 months. End date 10/11/2014.  Anticoagulant: Lovenox 120 units daily Switched to Eliquis on 05/25/2014 per patient preference    Past Surgical History  Procedure Laterality Date  . Rotator cuff repair  2005 (approx)    right   . Closed reduction with humer pin insertion  1974    left hip  . Joint replacement  2006 (approx)    right hip replaced  . 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 Total hip arthroplasty Left 07/21/2014    Procedure: TOTAL HIP ARTHROPLASTY ANTERIOR APPROACH;  Surgeon: Loreta Ave, MD;  Location: [facility] OR;  Service: Orthopedics;  Laterality: Left;  . Hardware  removal Left 07/21/2014    Procedure: HARDWARE REMOVAL;  Surgeon: Loreta Ave, MD;  Location: [facility] OR;  Service: Orthopedics;  Laterality: Left;   Family History  Problem Relation Age of Onset  . Cancer Mother     breast, colon, liver  . Diabetes Father    Social History  Substance Use Topics  . Smoking status: Never Smoker   . Smokeless tobacco: Never Used  . Alcohol Use: 0.0 oz/week    0 Standard drinks or equivalent per week     Comment: occasional    Review of Systems  10 Systems reviewed and are negative for acute change except as noted in the HPI.   Allergies  Review of patient's allergies indicates no known allergies.  Home Medications   Prior to Admission medications   Medication Sig Start Date End Date Taking? Authorizing Provider  bag balm OINT ointment Apply 1 application topically as needed for dry skin.   Yes Historical Provider, MD  cholecalciferol (VITAMIN D) 1000 UNITS tablet Take 1,000 Units by mouth daily.   Yes Historical Provider, MD  Insulin Glargine (TOUJEO SOLOSTAR) 300 UNIT/ML SOPN Inject 20 Units into the skin at bedtime.    Yes Historical Provider, MD  Insulin Regular Human (NOVOLIN R RELION IJ) Inject 12-20 Units as directed 3 (three) times daily.    Yes Historical Provider, MD  pravastatin (PRAVACHOL)  40 MG tablet TAKE ONE TABLET BY MOUTH ONCE DAILY 10/20/14  Yes Reather LittlerAjay Kumar, MD  sulfamethoxazole-trimethoprim (BACTRIM DS,SEPTRA DS) 800-160 MG tablet Take 1 tablet by mouth 2 (two) times daily.   Yes Historical Provider, MD  tadalafil (CIALIS) 20 MG tablet Take 1 tablet (20 mg total) by mouth daily as needed for erectile dysfunction. 05/12/15  Yes Reather LittlerAjay Kumar, MD  gabapentin (NEURONTIN) 300 MG capsule Take 1 capsule (300 mg total) by mouth 3 (three) times daily. Patient not taking: Reported on 06/01/2015 12/08/14 12/08/15  Dow Adolphichard Kazibwe, MD   BP 129/82 mmHg  Pulse 69  Temp(Src) 98 F (36.7 C) (Oral)  Resp 20  Ht 6' (1.829 m)  Wt 195 lb 14.4 oz  (88.86 kg)  BMI 26.56 kg/m2  SpO2 97% Physical Exam  Constitutional: He is oriented to person, place, and time. He appears well-developed and well-nourished.  HENT:  Head: Normocephalic and atraumatic.  Eyes: EOM are normal. Pupils are equal, round, and reactive to light.  Neck: Neck supple.  Cardiovascular: Normal rate, regular rhythm, normal heart sounds and intact distal pulses.   Pulmonary/Chest: Effort normal and breath sounds normal.  Abdominal: Soft. Bowel sounds are normal. He exhibits no distension. There is no tenderness.  Musculoskeletal: Normal range of motion. He exhibits edema and tenderness.  Neurological: He is alert and oriented to person, place, and time. He has normal strength. Coordination normal. GCS eye subscore is 4. GCS verbal subscore is 5. GCS motor subscore is 6.  Skin: Skin is warm, dry and intact.  Psychiatric: He has a normal mood and affect.          ED Course  Procedures (including critical care time) Labs Review Labs Reviewed  COMPREHENSIVE METABOLIC PANEL - Abnormal; Notable for the following:    Glucose, Bld 215 (*)    Albumin 3.3 (*)    All other components within normal limits  CBC WITH DIFFERENTIAL/PLATELET - Abnormal; Notable for the following:    WBC 14.5 (*)    Neutro Abs 10.2 (*)    Monocytes Absolute 1.8 (*)    All other components within normal limits  URINALYSIS, ROUTINE W REFLEX MICROSCOPIC (NOT AT [facility]) - Abnormal; Notable for the following:    Glucose, UA >1000 (*)    All other components within normal limits  URINE RAPID DRUG SCREEN, HOSP PERFORMED - Abnormal; Notable for the following:    Opiates POSITIVE (*)    Cocaine POSITIVE (*)    All other components within normal limits  BASIC METABOLIC PANEL - Abnormal; Notable for the following:    Glucose, Bld 227 (*)    Calcium 8.4 (*)    All other components within normal limits  CBC - Abnormal; Notable for the following:    WBC 11.6 (*)    Hemoglobin 12.9 (*)    All  other components within normal limits  SEDIMENTATION RATE - Abnormal; Notable for the following:    Sed Rate 56 (*)    All other components within normal limits  GLUCOSE, CAPILLARY - Abnormal; Notable for the following:    Glucose-Capillary 200 (*)    All other components within normal limits  GLUCOSE, CAPILLARY - Abnormal; Notable for the following:    Glucose-Capillary 208 (*)    All other components within normal limits  GLUCOSE, CAPILLARY - Abnormal; Notable for the following:    Glucose-Capillary 185 (*)    All other components within normal limits  CBG MONITORING, ED - Abnormal; Notable for the following:  Glucose-Capillary 124 (*)    All other components within normal limits  CULTURE, BLOOD (ROUTINE X 2)  CULTURE, BLOOD (ROUTINE X 2)  URINE MICROSCOPIC-ADD ON  HIV ANTIBODY (ROUTINE TESTING)  PROTIME-INR  APTT  I-STAT CG4 LACTIC ACID, ED  I-STAT CG4 LACTIC ACID, ED  TYPE AND SCREEN  ABO/RH    Imaging Review Dg Foot Complete Right  06/01/2015  CLINICAL DATA:  Diabetic ulcer plantar past RIGHT foot at calcaneus question osteomyelitis, type II diabetes mellitus, diabetic polyneuropathy EXAM: RIGHT FOOT COMPLETE - 3+ VIEW COMPARISON:  09/15/2013 FINDINGS: Osseous demineralization. Joint spaces preserved. Scattered soft tissue swelling. Small soft tissue wound/ulcer at plantar aspect of calcaneus. Mild cortical irregularity along the plantar aspect of the posterior calcaneus similar to previous exam. No definite acute fracture, dislocation or new bone destruction. Scattered small vessel vascular calcification. IMPRESSION: Chronic cortical irregularity at plantar aspect of posterior calcaneus little changed from previous exam without definite evidence of acute bone destruction to suggest acute osteomyelitis. If osteomyelitis remains a clinical concern, recommend followup MR imaging. Electronically Signed   By: Ulyses Southward M.D.   On: 06/01/2015 14:53   I have personally reviewed and  evaluated these images and lab results as part of my medical decision-making.   EKG Interpretation   Date/Time:  Wednesday June 01 2015 20:08:55 EDT Ventricular Rate:  81 PR Interval:  176 QRS Duration: 88 QT Interval:  401 QTC Calculation: 465 R Axis:   40 Text Interpretation:  Sinus rhythm Low voltage, precordial leads ED  PHYSICIAN INTERPRETATION AVAILABLE IN [facility] Confirmed by TEST,  Record ([id number]) on 06/02/2015 7:15:39 AM     Consult: 1430 Dr. Lajoyce Corners will see the patient in the emergency department. MDM   Final diagnoses:  Subacute osteomyelitis of foot, unspecified laterality (HCC)  Cellulitis of right lower extremity   Patient has developed cellulitis of the lower extremity association with a chronic osteomyelitis. He has failed outpatient treatment with antibiotics and hyperbaric therapy. Patient will have definitive management with Dr. Lajoyce Corners.    Arby Barrette, MD 06/02/15 [phone]

## 2015-06-01 NOTE — H&P (Signed)
[facility] History and Physical  Gregory May ZMO:[id number] DOB: 15-Mar-1961 DOA: 06/01/2015  Referring physician: ED physician PCP: Burgess Estelle, MD  Specialists:   Chief Complaint: right foot heel ulcer, swelling and pain  HPI: Gregory May is a 54 y.o. male with PMH of hyperlipidemia, diabetes mellitus, DVT, PE, arthritis, DJD, who presents with right foot heel ulcer and pain.  Patient reports that he has been having a right foot heel ulcer since 09/2013. He was initially being treated at wound care and recently tried 6 cycles of hyperbaric therapy. He has been on outpatient antibiotics. Most recently he had treatment with Keflex and Bactrim, but without significant improvement. Since yesterday, R foot has started to swell and get red, particularly over the lateral right foot. There is some pink colored discharge from the heel ulcer. He reports having severe R foot pain, which is sharp, constant and nonradiating. Patient does not have fever, chills, nausea, vomiting, chest pain, shortness of breath, abdominal pain, diarrhea, symptoms of UTI or unilateral weakness.  In ED, patient was found to have a lactate of 1.25--> 0.70, negative urinalysis, WBC 14.5, temperature normal, no tachycardia, electrolytes okay. X-ray of right foot is suggestive osteomyelitis. Patient is admitted to inpatient for further evaluation and treatment. Orthopedic surgeon was consulted by ED.  Where does patient live?   At home   Can patient participate in ADLs? Some   Review of Systems:   General: no fevers, chills, no changes in body weight, has fatigue HEENT: no blurry vision, hearing changes or sore throat Pulm: no dyspnea, coughing, wheezing CV: no chest pain, palpitations Abd: no nausea, vomiting, abdominal pain, diarrhea, constipation GU: no dysuria, burning on urination, increased urinary frequency, hematuria  Ext: has R foot swelling, ulcer over R heel and pain Neuro: no unilateral  weakness, numbness, or tingling, no vision change or hearing loss Skin: no rash MSK: No muscle spasm, no deformity, no limitation of range of movement in spin Heme: No easy bruising.  Travel history: No recent long distant travel.  Allergy: No Known Allergies  Past Medical History  Diagnosis Date  . Sebaceous cyst     on back of neck  . Diabetes mellitus     type II  . Pulmonary embolism ([place])   . Deep vein thrombosis (DVT) ([place])   . Arthritis     bilateral hips  . 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 DJD (degenerative joint disease)   . Pulmonary emboli ([place]) 02/08/2014    Date of diagnosis February 08 2014, on chest CTA Hospitalized for 3 days Had some symptoms of shortness of breath, and chest pain With intercurrent DVT of the left LE. Duration of anticoagulation: 8 months. End date 10/11/2014.  Anticoagulant: Lovenox 120 units daily Switched to Eliquis on 05/25/2014 per patient preference     Past Surgical History  Procedure Laterality Date  . Rotator cuff repair  2005 (approx)    right   . Closed reduction with humer pin insertion  1974    left hip  . Joint replacement  2006 (approx)    right hip replaced  . 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 Total hip arthroplasty Left 07/21/2014    Procedure: TOTAL HIP ARTHROPLASTY ANTERIOR APPROACH;  Surgeon: Ninetta Lights, MD;  Location: [facility];  Service: Orthopedics;  Laterality: Left;  . Hardware removal Left 07/21/2014    Procedure: HARDWARE REMOVAL;  Surgeon: Nestor Ramp  Percell Miller, MD;  Location: [place];  Service: Orthopedics;  Laterality: Left;    Social History:  reports that he has never smoked. He has never used smokeless tobacco. He reports that he drinks alcohol. He reports that he does not use illicit drugs.  Family History:  Family History  Problem Relation Age of Onset  . Cancer Mother     breast, colon, liver  . Diabetes Father      Prior to Admission medications   Medication Sig Start Date  End Date Taking? Authorizing Provider  bag balm OINT ointment Apply 1 application topically as needed for dry skin.   Yes Historical Provider, MD  cholecalciferol (VITAMIN D) 1000 UNITS tablet Take 1,000 Units by mouth daily.   Yes Historical Provider, MD  Insulin Glargine (TOUJEO SOLOSTAR) 300 UNIT/ML SOPN Inject 20 Units into the skin at bedtime.    Yes Historical Provider, MD  Insulin Regular Human (NOVOLIN R RELION IJ) Inject 12-20 Units as directed 3 (three) times daily.    Yes Historical Provider, MD  pravastatin (PRAVACHOL) 40 MG tablet TAKE ONE TABLET BY MOUTH ONCE DAILY 10/20/14  Yes Elayne Snare, MD  sulfamethoxazole-trimethoprim (BACTRIM DS,SEPTRA DS) 800-160 MG tablet Take 1 tablet by mouth 2 (two) times daily.   Yes Historical Provider, MD  tadalafil (CIALIS) 20 MG tablet Take 1 tablet (20 mg total) by mouth daily as needed for erectile dysfunction. 05/12/15  Yes Elayne Snare, MD  gabapentin (NEURONTIN) 300 MG capsule Take 1 capsule (300 mg total) by mouth 3 (three) times daily. Patient not taking: Reported on 06/01/2015 12/08/14 12/08/15  Jessee Avers, MD    Physical Exam: Filed Vitals:   06/01/15 1101 06/01/15 1115 06/01/15 1641 06/01/15 1659  BP: 143/100 117/82 128/83 126/88  Pulse: 97  69 77  Temp: 98.1 F (36.7 C)     TempSrc: Oral     Resp: 20  18 18   SpO2: 96%  100% 98%   General: Not in acute distress HEENT:       Eyes: PERRL, EOMI, no scleral icterus.       ENT: No discharge from the ears and nose, no pharynx injection, no tonsillar enlargement.        Neck: No JVD, no bruit, no mass felt. Heme: No neck lymph node enlargement. Cardiac: S1/S2, RRR, No murmurs, No gallops or rubs. Pulm: No rales, wheezing, rhonchi or rubs. Abd: Soft, nondistended, nontender, no rebound pain, no organomegaly, BS present. Ext:  2+DP/PT pulse bilaterally. There is ulcer over R heel, ~1.1 cm in size, with serum sanguinous discharge, has swelling, redness and warmth in R foot, particularly  over lateral side. Musculoskeletal: No joint deformities, No joint redness or warmth, no limitation of ROM in spin. Skin: No rashes.  Neuro: Alert, oriented X3, cranial nerves II-XII grossly intact, muscle strength 5/5 in all extremities, sensation to light touch intact.  Psych: Patient is not psychotic, no suicidal or hemocidal ideation.  Labs on Admission:  Basic Metabolic Panel:  Recent Labs Lab 06/01/15 1149  NA 135  K 4.0  CL 103  CO2 25  GLUCOSE 215*  BUN 13  CREATININE 0.80  CALCIUM 8.9   Liver Function Tests:  Recent Labs Lab 06/01/15 1149  AST 35  ALT 48  ALKPHOS 78  BILITOT 0.4  PROT 7.5  ALBUMIN 3.3*   No results for input(s): LIPASE, AMYLASE in the last 168 hours. No results for input(s): AMMONIA in the last 168 hours. CBC:  Recent Labs Lab 06/01/15 1149  WBC 14.5*  NEUTROABS 10.2*  HGB 14.0  HCT 42.1  MCV 91.9  PLT 385   Cardiac Enzymes: No results for input(s): CKTOTAL, CKMB, CKMBINDEX, TROPONINI in the last 168 hours.  BNP (last 3 results) No results for input(s): BNP in the last 8760 hours.  ProBNP (last 3 results) No results for input(s): PROBNP in the last 8760 hours.  CBG:  Recent Labs Lab 06/01/15 1742  GLUCAP 124*    Radiological Exams on Admission: Dg Foot Complete Right  06/01/2015  CLINICAL DATA:  Diabetic ulcer plantar past RIGHT foot at calcaneus question osteomyelitis, type II diabetes mellitus, diabetic polyneuropathy EXAM: RIGHT FOOT COMPLETE - 3+ VIEW COMPARISON:  09/15/2013 FINDINGS: Osseous demineralization. Joint spaces preserved. Scattered soft tissue swelling. Small soft tissue wound/ulcer at plantar aspect of calcaneus. Mild cortical irregularity along the plantar aspect of the posterior calcaneus similar to previous exam. No definite acute fracture, dislocation or new bone destruction. Scattered small vessel vascular calcification. IMPRESSION: Chronic cortical irregularity at plantar aspect of posterior calcaneus  little changed from previous exam without definite evidence of acute bone destruction to suggest acute osteomyelitis. If osteomyelitis remains a clinical concern, recommend followup MR imaging. Electronically Signed   By: Lavonia Dana M.D.   On: 06/01/2015 14:53    EKG:  Not done in ED, will get one.   Assessment/Plan Principal Problem:   Infection of right foot Active Problems:   Diabetic foot ulcer ([place])   Pure hypercholesterolemia   Diabetes mellitus type 2, controlled, with complications ([place])   Pulmonary emboli (HCC)   DVT (deep venous thrombosis) (HCC)   Osteoarthritis, hip, bilateral   DJD (degenerative joint disease) of knee   Diabetic polyneuropathy associated with type 2 diabetes mellitus ([place])   Osteomyelitis (HCC)   Diabetic foot infection ([place])   Infection of right foot, diabetic foot ulcer and osteomyelitis: Patient failed outpatient treatment with multiple courses of oral antibiotics and hyperbaric oxygen. Dr. Sharol Given saw pt in ED, recommended IV antibiotics at this time and planning for transtibial amputation on Friday afternoon. Patient is not septic on admission, hemodynamically stable.  - will admit to med-surg bed - Appreciate Dr. Jess Barters consultation, and follow-up recommendations. -Transferred to [facility] for hospitalist per Dr. Sharol Given - Empiric antimicrobial treatment with vancomycin and Zosyn per pharmacy - PRN Zofran for nausea, morphine and Percocet for pain - Blood cultures x 2  - ESR and CRP - wound care consult - IVF: NS 100 cc/h - INR/PTT/type & screen  HLD: Last LDL was 95 on 07/13/14  -Continue home medications: Pravastatin  DM-II: Last A1c 8.2 on 12/15/14, poorly controled. Patient is taking glargine insulin and Novolin at home -will continue glargine insulin 20 units daily -SSI  Hx of PE and DVT: completed 6 month of Lovenox treatment. No acute issues.  Diabetic polyneuropathy associated with type 2 diabetes mellitus ([place]): -Neurontin  DVT ppx: SQ  Heparin     Code Status: Full code Family Communication:   Yes, patient's brother  at bed side Disposition Plan: Admit to inpatient   Date of Service 06/01/2015    Ivor Costa [facility] Pager [phone]  If 7PM-7AM, please contact night-coverage [email or web address] Password [email or web address] 06/01/2015, 8:07 PM

## 2015-06-01 NOTE — ED Notes (Addendum)
Pt reports hx of DM, started having right foot ulcer in Feb 2015, pt started going to wound care in Aug 2015, 6 treatments with hyperbaric chamber, Dr Lajoyce Cornersuda started pt on abx cephalexin x6 weeks and wanted to amputate foot. Pt got a second opinion and seeing Dr Ardyth HarpsHernandez from [place] [facility], he discontinued cephalexin and started  Pt yesterday on SMZ/ TMP DS 800-160. Pt came back to Dr Ardyth HarpsHernandez office again for hyperbaric chamber visit today, which did not happen, pt sent to ED. Foot is more swollen, has red streaks going up leg, and increased pain. Pain 10/10.

## 2015-06-01 NOTE — Progress Notes (Addendum)
[facility].  Patient has recently lost his job and his insurance.  Patient is diabetic and takes two types of insulin.  Patient reports before his insurance ran out, "I stocked up on insulin."  Patient reports he purchases his Regular Relion insulin [facility].  Patient also takes Toujeo insulin.  Patient reports his endocrinologist Dr. Viviano SimasAjay. Lucianne MussKumar has provided patient with a coupon card for [facility] which allowed him to get three pens for free. Patient reports he has received samples of Toujeo from his endocrinologist.   Patient reports he has lantus insulin at home.  EDCM strongly encouraged patient to speak to his doctor before changing insulin.  Patient reports he will do so.  [facility] informed patient of web sites, needymeds.org, [facility] and GoodRX.com for medication assistance.  [facility] provided [facility] with application from needymeds.org for assistance with Toujeo.  [facility]. EDCM also gave patient contact information to enroll for the orange card.  Patient thankful for assistance.  No further EDCM needs at this time.

## 2015-06-01 NOTE — Progress Notes (Signed)
ANTIBIOTIC CONSULT NOTE - INITIAL  Pharmacy Consult for vancomycin  Indication: diabetic foot ulcer  No Known Allergies  Patient Measurements:   Adjusted Body Weight:  Vital Signs: Temp: 98.1 F (36.7 C) (10/19 1101) Temp Source: Oral (10/19 1101) BP: 126/88 mmHg (10/19 1659) Pulse Rate: 77 (10/19 1659) Intake/Output from previous day:   Intake/Output from this shift:    Labs:  Recent Labs  06/01/15 1149  WBC 14.5*  HGB 14.0  PLT 385  CREATININE 0.80   CrCl cannot be calculated (Unknown ideal weight.). No results for input(s): VANCOTROUGH, VANCOPEAK, VANCORANDOM, GENTTROUGH, GENTPEAK, GENTRANDOM, TOBRATROUGH, TOBRAPEAK, TOBRARND, AMIKACINPEAK, AMIKACINTROU, AMIKACIN in the last 72 hours.   Microbiology: No results found for this or any previous visit (from the past 720 hour(s)).  Medical History: Past Medical History  Diagnosis Date  . Sebaceous cyst     on back of neck  . Diabetes mellitus     type II  . Pulmonary embolism (HCC)   . Deep vein thrombosis (DVT) (HCC)   . Arthritis     bilateral hips  . 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 DJD (degenerative joint disease)   . Pulmonary emboli (HCC) 02/08/2014    Date of diagnosis February 08 2014, on chest CTA Hospitalized for 3 days Had some symptoms of shortness of breath, and chest pain With intercurrent DVT of the left LE. Duration of anticoagulation: 8 months. End date 10/11/2014.  Anticoagulant: Lovenox 120 units daily Switched to Eliquis on 05/25/2014 per patient preference    Assessment: [age] YOM with diabetic foot ulcer.  On recurrent outpatient antibiotics (recently cephalexin and TMP/SMZ).  Pharmacy asked to dose vancomycin, patient already ordered zosyn. No obvious osteomyelitis per plain film, MRI suggested.   10/19 >> vancomycin >> 10/19 >> pip/tazo >>  Renal: SCr WNL WBC slightly elevated  Goal of Therapy:  Vancomycin trough level 15-20 mcg/ml - treat as osteomyelitis unless proven  otherwise  Plan:   Vancomycin 1gm IV x 1 (given in ED) then 1gm IV q8h   - (VT in feb '15 was therapeutic on this regimen)  Check vancomycin trough if remains on vancomycin > 48h  Follow renal function  Continue zosyn as ordered but change to 4h infusion q8h  Juliette Alcideustin Zeigler, PharmD, BCPS.   Pager: [phone] 06/01/2015,7:46 PM

## 2015-06-02 ENCOUNTER — Other Ambulatory Visit (HOSPITAL_COMMUNITY): Payer: Self-pay | Admitting: Orthopedic Surgery

## 2015-06-02 LAB — BASIC METABOLIC PANEL
Anion gap: 6 (ref 5–15)
BUN: 10 mg/dL (ref 6–20)
CO2: 27 mmol/L (ref 22–32)
Calcium: 8.4 mg/dL — ABNORMAL LOW (ref 8.9–10.3)
Chloride: 102 mmol/L (ref 101–111)
Creatinine, Ser: 0.9 mg/dL (ref 0.61–1.24)
GFR calc Af Amer: >60 mL/min (ref >60)
GFR calc non Af Amer: >60 mL/min (ref >60)
Glucose, Bld: 227 mg/dL — ABNORMAL HIGH (ref 65–99)
Potassium: 4.1 mmol/L (ref 3.5–5.1)
Sodium: 135 mmol/L (ref 135–145)

## 2015-06-02 LAB — ABO/RH: ABO/RH(D): A POS

## 2015-06-02 LAB — RAPID URINE DRUG SCREEN, HOSP PERFORMED
Amphetamines: NOT DETECTED
Barbiturates: NOT DETECTED
Benzodiazepines: NOT DETECTED
Cocaine: POSITIVE — AB
Opiates: POSITIVE — AB
Tetrahydrocannabinol: NOT DETECTED

## 2015-06-02 LAB — GLUCOSE, CAPILLARY
Glucose-Capillary: 208 mg/dL — ABNORMAL HIGH (ref 65–99)
Glucose-Capillary: 185 mg/dL — ABNORMAL HIGH (ref 65–99)
Glucose-Capillary: 248 mg/dL — ABNORMAL HIGH (ref 65–99)
Glucose-Capillary: 207 mg/dL — ABNORMAL HIGH (ref 65–99)

## 2015-06-02 LAB — CBC
HCT: 40.3 % (ref 39.0–52.0)
Hemoglobin: 12.9 g/dL — ABNORMAL LOW (ref 13.0–17.0)
MCH: 29.9 pg (ref 26.0–34.0)
MCHC: 32 g/dL (ref 30.0–36.0)
MCV: 93.5 fL (ref 78.0–100.0)
Platelets: 386 10*3/uL (ref 150–400)
RBC: 4.31 MIL/uL (ref 4.22–5.81)
RDW: 13.8 % (ref 11.5–15.5)
WBC: 11.6 10*3/uL — ABNORMAL HIGH (ref 4.0–10.5)

## 2015-06-02 LAB — SURGICAL PCR SCREEN
MRSA, PCR: NEGATIVE
Staphylococcus aureus: NEGATIVE

## 2015-06-02 LAB — HIV ANTIBODY (ROUTINE TESTING W REFLEX): HIV Screen 4th Generation wRfx: NONREACTIVE

## 2015-06-02 MED ORDER — INSULIN ASPART 100 UNIT/ML ~~LOC~~ SOLN
5.0000 [IU] | Freq: Three times a day (TID) | SUBCUTANEOUS | Status: DC
  Administered 2015-06-04 – 2015-06-05 (×3): 5 [IU] via SUBCUTANEOUS

## 2015-06-02 NOTE — Progress Notes (Signed)
PT Cancellation Note  Patient Details Name: Gregory May MRN: [id number] DOB: [birth year]   Cancelled Treatment:    Reason Eval/Treat Not Completed: Patient not medically ready. Pt is scheduled for transtibial amputation tomorrow with Dr. Lajoyce Cornersuda.  Will check back post-op for updated PT orders to complete PT evaluation.  Thank you.   [name] 06/02/2015, 9:03 AM

## 2015-06-02 NOTE — Progress Notes (Signed)
[facility] PROGRESS NOTE  Hoy MornVergil T Taitano ZOX:[id number] DOB: [birth year] DOA: 06/01/2015 PCP: [name], MD  Assessment/Plan:  Principal Problem:   Right calcaneus osteomyelitis with leg cellulitis: Continue vancomycin and Zosyn. Cellulitis is improving. Patient has questions about surgery and have asked nursing staff to notify Dr. Lajoyce Cornersuda. Active Problems:   Pure hypercholesterolemia   Diabetes mellitus type 2, uncontrolled, with complications (HCC): On Lantus and sliding scale. Blood glucose is running high. Will add meal coverage. h/o  Pulmonary emboli (HCC)   H/o DVT (deep venous thrombosis) (HCC)   Osteoarthritis, hip, bilateral   DJD (degenerative joint disease) of knee   Diabetic polyneuropathy associated with type 2 diabetes mellitus (HCC)  Code Status:  full Family Communication:  Multiple at bedside Disposition Plan:    Consultants:  Lajoyce Cornersuda  Procedures:     Antibiotics:  vanc zosyn  HPI/Subjective: Wondering whether he should proceed with surgery versus continuing hyperbaric treatments. Wants to speak with Dr. Lajoyce Cornersuda  Objective: Filed Vitals:   06/02/15 1300  BP: 129/82  Pulse: 69  Temp: 98 F (36.7 C)  Resp: 20    Intake/Output Summary (Last 24 hours) at 06/02/15 1614 Last data filed at 06/02/15 1300  Gross per 24 hour  Intake    600 ml  Output   3300 ml  Net  -2700 ml   Filed Weights   06/01/15 2207  Weight: 88.86 kg (195 lb 14.4 oz)    Exam:   General:  Talkative. Seated at side of bed. Nontoxic appearing.  Cardiovascular: Regular rate rhythm without murmurs gallops rubs  Respiratory: Clear to auscultation bilaterally without wheezes rhonchi or rales  Abdomen: Soft nontender nondistended  Ext: Minimal erythema pretibial area surrounding small eschar. Induration about the ankle and foot. Dressing clean dry intact  Basic Metabolic Panel:  Recent Labs Lab 06/01/15 1149 06/02/15 0250  NA 135 135  K 4.0 4.1  CL 103 102   CO2 25 27  GLUCOSE 215* 227*  BUN 13 10  CREATININE 0.80 0.90  CALCIUM 8.9 8.4*   Liver Function Tests:  Recent Labs Lab 06/01/15 1149  AST 35  ALT 48  ALKPHOS 78  BILITOT 0.4  PROT 7.5  ALBUMIN 3.3*   No results for input(s): LIPASE, AMYLASE in the last 168 hours. No results for input(s): AMMONIA in the last 168 hours. CBC:  Recent Labs Lab 06/01/15 1149 06/02/15 0250  WBC 14.5* 11.6*  NEUTROABS 10.2*  --   HGB 14.0 12.9*  HCT 42.1 40.3  MCV 91.9 93.5  PLT 385 386   Cardiac Enzymes: No results for input(s): CKTOTAL, CKMB, CKMBINDEX, TROPONINI in the last 168 hours. BNP (last 3 results) No results for input(s): BNP in the last 8760 hours.  ProBNP (last 3 results) No results for input(s): PROBNP in the last 8760 hours.  CBG:  Recent Labs Lab 06/01/15 1742 06/01/15 2205 06/02/15 0640 06/02/15 1101  GLUCAP 124* 200* 208* 185*    No results found for this or any previous visit (from the past 240 hour(s)).   Studies: Dg Foot Complete Right  06/01/2015  CLINICAL DATA:  Diabetic ulcer plantar past RIGHT foot at calcaneus question osteomyelitis, type II diabetes mellitus, diabetic polyneuropathy EXAM: RIGHT FOOT COMPLETE - 3+ VIEW COMPARISON:  09/15/2013 FINDINGS: Osseous demineralization. Joint spaces preserved. Scattered soft tissue swelling. Small soft tissue wound/ulcer at plantar aspect of calcaneus. Mild cortical irregularity along the plantar aspect of the posterior calcaneus similar to previous exam. No definite acute fracture, dislocation or new  bone destruction. Scattered small vessel vascular calcification. IMPRESSION: Chronic cortical irregularity at plantar aspect of posterior calcaneus little changed from previous exam without definite evidence of acute bone destruction to suggest acute osteomyelitis. If osteomyelitis remains a clinical concern, recommend followup MR imaging. Electronically Signed   By: Ulyses Southward M.D.   On: 06/01/2015 14:53     Scheduled Meds: . cholecalciferol  1,000 Units Oral Daily  . gabapentin  300 mg Oral TID  . heparin  5,000 Units Subcutaneous 3 times per day  . insulin aspart  0-9 Units Subcutaneous TID WC  . insulin glargine  20 Units Subcutaneous QHS  . piperacillin-tazobactam (ZOSYN)  IV  3.375 g Intravenous 3 times per day  . pravastatin  40 mg Oral Daily  . vancomycin  1,000 mg Intravenous 3 times per day   Continuous Infusions: . sodium chloride 100 mL/hr at 06/02/15 1011    Time spent: 25 minutes  Brannen Koppen L  [facility]  [email or web address], password [facility] 06/02/2015, 4:14 PM  LOS: 1 day

## 2015-06-02 NOTE — Progress Notes (Signed)
Inpatient Diabetes Program Recommendations  AACE/ADA: New Consensus Statement on Inpatient Glycemic Control (2015)  Target Ranges:  Prepandial:   less than 140 mg/dL      Peak postprandial:   less than 180 mg/dL (1-2 hours)      Critically ill patients:  140 - 180 mg/dL   Review of Glycemic Control:  Results for Gregory May, Hoyte T (MRN [id number]) as of 06/02/2015 12:46  Ref. Range 06/01/2015 17:42 06/01/2015 22:05 06/02/2015 06:40 06/02/2015 11:01  Glucose-Capillary Latest Ref Range: 65-99 mg/dL 409124 (H) 811200 (H) 914208 (H) 185 (H)   Diabetes history: Type 2 diabetes Outpatient Diabetes medications: Toujeo 20 units q HS, Novolin 12-20 units tid with meals Current orders for Inpatient glycemic control:  Novolog sensitive tid with meals, Lantus 20 units q HS  Inpatient Diabetes Program Recommendations:   Consider increasing Lantus to 25 units daily and also increasing Novolog correction to moderate. Also consider ordering A1C to determine pre-hospitalization glycemic control  Thanks, Beryl MeagerJenny Stace Peace, RN, BC-ADM Inpatient Diabetes Coordinator Pager [phone] (8a-5p)

## 2015-06-02 NOTE — Progress Notes (Signed)
Utilization review completed.  

## 2015-06-02 NOTE — Consult Note (Signed)
WOC consulted for right foot wounds. Patient followed by Dr. Lajoyce Cornersuda in the past and most recently by wound care center MD, has received topical care as well as HBO tx. Without success.  Worsening foot ulcers.  Reviewed record.  Patient has been seen by Dr. Lajoyce Cornersuda and plans to proceed with transtibial amputation on Friday.  I have ordered conservative normal saline gauze dressings until surgery occurs.     Re consult if needed, will not follow at this time. Thanks  Faraaz Wolin Foot Lockerustin RN, CWOCN [phone])

## 2015-06-02 NOTE — Progress Notes (Signed)
OT Cancellation Note  Patient Details Name: Hoy MornVergil T Yeley MRN: [id number] DOB: [birth year]   Cancelled Treatment:    Reason Eval/Treat Not Completed: Patient not medically ready. Per PT, pt scheduled for transtibial amputation tomorrow. Will check back post-op for updated OT orders to complete OT eval.   Gaye AlkenBailey A Vasco Chong M.S., OTR/L Pager: [phone]  06/02/2015, 9:06 AM

## 2015-06-03 ENCOUNTER — Inpatient Hospital Stay (HOSPITAL_COMMUNITY): Payer: Medicaid Other

## 2015-06-03 ENCOUNTER — Encounter (HOSPITAL_COMMUNITY)
Admission: EM | Disposition: A | Discharge status: Rehabilitation Facility | Payer: Self-pay | Source: Home / Self Care | Attending: Internal Medicine

## 2015-06-03 ENCOUNTER — Inpatient Hospital Stay (HOSPITAL_COMMUNITY): Payer: Medicaid Other | Admitting: Anesthesiology

## 2015-06-03 HISTORY — PX: AMPUTATION: SHX166

## 2015-06-03 LAB — GLUCOSE, CAPILLARY
Glucose-Capillary: 276 mg/dL — ABNORMAL HIGH (ref 65–99)
Glucose-Capillary: 93 mg/dL (ref 65–99)
Glucose-Capillary: 93 mg/dL (ref 65–99)
Glucose-Capillary: 101 mg/dL — ABNORMAL HIGH (ref 65–99)
Glucose-Capillary: 292 mg/dL — ABNORMAL HIGH (ref 65–99)

## 2015-06-03 LAB — VANCOMYCIN, TROUGH: Vancomycin Tr: 15 ug/mL (ref 10.0–20.0)

## 2015-06-03 SURGERY — AMPUTATION BELOW KNEE
Anesthesia: Monitor Anesthesia Care | Laterality: Right

## 2015-06-03 MED ORDER — HYDROMORPHONE HCL 1 MG/ML IJ SOLN
1.0000 mg | INTRAMUSCULAR | Status: DC | PRN
  Administered 2015-06-04 – 2015-06-07 (×16): 1 mg via INTRAVENOUS
  Filled 2015-06-03 (×17): qty 1

## 2015-06-03 MED ORDER — OXYCODONE HCL 5 MG/5ML PO SOLN: 5.0000 mg | Freq: Once | ORAL | Status: DC | PRN

## 2015-06-03 MED ORDER — PROPOFOL 500 MG/50ML IV EMUL
INTRAVENOUS | Status: DC | PRN
  Administered 2015-06-03: 50 ug/kg/min via INTRAVENOUS

## 2015-06-03 MED ORDER — FENTANYL CITRATE (PF) 100 MCG/2ML IJ SOLN
100.0000 ug | Freq: Once | INTRAMUSCULAR | Status: AC
  Administered 2015-06-03: 100 ug via INTRAVENOUS

## 2015-06-03 MED ORDER — METHOCARBAMOL 500 MG PO TABS
500.0000 mg | ORAL_TABLET | Freq: Four times a day (QID) | ORAL | Status: DC | PRN
  Administered 2015-06-03 – 2015-06-07 (×9): 500 mg via ORAL
  Filled 2015-06-03 (×9): qty 1

## 2015-06-03 MED ORDER — MIDAZOLAM HCL 2 MG/2ML IJ SOLN
INTRAMUSCULAR | Status: AC
  Filled 2015-06-03: qty 2

## 2015-06-03 MED ORDER — MIDAZOLAM HCL 2 MG/2ML IJ SOLN
2.0000 mg | Freq: Once | INTRAMUSCULAR | Status: AC
  Administered 2015-06-03: 2 mg via INTRAVENOUS

## 2015-06-03 MED ORDER — 0.9 % SODIUM CHLORIDE (POUR BTL) OPTIME
TOPICAL | Status: DC | PRN
  Administered 2015-06-03: 1000 mL

## 2015-06-03 MED ORDER — OXYCODONE HCL 5 MG PO TABS
5.0000 mg | ORAL_TABLET | ORAL | Status: DC | PRN
  Administered 2015-06-03: 10 mg via ORAL
  Administered 2015-06-03: 5 mg via ORAL
  Administered 2015-06-04 – 2015-06-07 (×13): 10 mg via ORAL
  Filled 2015-06-03 (×7): qty 2
  Filled 2015-06-03: qty 1
  Filled 2015-06-03 (×8): qty 2

## 2015-06-03 MED ORDER — METOCLOPRAMIDE HCL 5 MG/ML IJ SOLN
5.0000 mg | Freq: Three times a day (TID) | INTRAMUSCULAR | Status: DC | PRN
Start: 2015-06-03 — End: 2015-06-06

## 2015-06-03 MED ORDER — LACTATED RINGERS IV SOLN
INTRAVENOUS | Status: DC | PRN
  Administered 2015-06-03: 14:00:00 via INTRAVENOUS

## 2015-06-03 MED ORDER — ROPIVACAINE HCL 5 MG/ML IJ SOLN
INTRAMUSCULAR | Status: DC | PRN
  Administered 2015-06-03: 10 mL via PERINEURAL
  Administered 2015-06-03: 20 mL via PERINEURAL

## 2015-06-03 MED ORDER — ONDANSETRON HCL 4 MG/2ML IJ SOLN: 4.0000 mg | Freq: Four times a day (QID) | INTRAMUSCULAR | Status: DC | PRN

## 2015-06-03 MED ORDER — ACETAMINOPHEN 650 MG RE SUPP: 650.0000 mg | Freq: Four times a day (QID) | RECTAL | Status: DC | PRN

## 2015-06-03 MED ORDER — LACTATED RINGERS IV SOLN
INTRAVENOUS | Status: DC
  Administered 2015-06-03: 13:00:00 via INTRAVENOUS

## 2015-06-03 MED ORDER — METOCLOPRAMIDE HCL 5 MG PO TABS: 5.0000 mg | ORAL_TABLET | Freq: Three times a day (TID) | ORAL | Status: DC | PRN

## 2015-06-03 MED ORDER — FENTANYL CITRATE (PF) 250 MCG/5ML IJ SOLN
INTRAMUSCULAR | Status: AC
  Filled 2015-06-03: qty 5

## 2015-06-03 MED ORDER — ONDANSETRON HCL 4 MG PO TABS
4.0000 mg | ORAL_TABLET | Freq: Four times a day (QID) | ORAL | Status: DC | PRN
Start: 2015-06-03 — End: 2015-06-07

## 2015-06-03 MED ORDER — ACETAMINOPHEN 325 MG PO TABS: 325.0000 mg | ORAL_TABLET | ORAL | Status: DC | PRN

## 2015-06-03 MED ORDER — LIDOCAINE HCL (CARDIAC) 20 MG/ML IV SOLN
INTRAVENOUS | Status: DC | PRN
  Administered 2015-06-03: 50 mg via INTRAVENOUS

## 2015-06-03 MED ORDER — HYDROMORPHONE HCL 1 MG/ML IJ SOLN: 0.2500 mg | INTRAMUSCULAR | Status: DC | PRN

## 2015-06-03 MED ORDER — ACETAMINOPHEN 325 MG PO TABS: 650.0000 mg | ORAL_TABLET | Freq: Four times a day (QID) | ORAL | Status: DC | PRN

## 2015-06-03 MED ORDER — ACETAMINOPHEN 160 MG/5ML PO SOLN: 325.0000 mg | ORAL | Status: DC | PRN

## 2015-06-03 MED ORDER — FENTANYL CITRATE (PF) 100 MCG/2ML IJ SOLN
INTRAMUSCULAR | Status: AC
  Filled 2015-06-03: qty 2

## 2015-06-03 MED ORDER — SODIUM CHLORIDE 0.9 % IV SOLN: INTRAVENOUS | Status: DC

## 2015-06-03 MED ORDER — BUPIVACAINE-EPINEPHRINE (PF) 0.5% -1:200000 IJ SOLN
INTRAMUSCULAR | Status: DC | PRN
  Administered 2015-06-03: 20 mL via PERINEURAL
  Administered 2015-06-03: 10 mL via PERINEURAL

## 2015-06-03 MED ORDER — METHOCARBAMOL 1000 MG/10ML IJ SOLN
500.0000 mg | Freq: Four times a day (QID) | INTRAVENOUS | Status: DC | PRN
  Filled 2015-06-03: qty 5

## 2015-06-03 MED ORDER — OXYCODONE HCL 5 MG PO TABS: 5.0000 mg | ORAL_TABLET | Freq: Once | ORAL | Status: DC | PRN

## 2015-06-03 SURGICAL SUPPLY — 38 items
BLADE SAW RECIP 87.9 MT (BLADE) ×3 IMPLANT
BLADE SURG 21 STRL SS (BLADE) ×3 IMPLANT
BNDG COHESIVE 6X5 TAN STRL LF (GAUZE/BANDAGES/DRESSINGS) ×3 IMPLANT
BNDG GAUZE ELAST 4 BULKY (GAUZE/BANDAGES/DRESSINGS) ×6 IMPLANT
COVER SURGICAL LIGHT HANDLE (MISCELLANEOUS) ×6 IMPLANT
CUFF TOURNIQUET SINGLE 34IN LL (TOURNIQUET CUFF) IMPLANT
CUFF TOURNIQUET SINGLE 44IN (TOURNIQUET CUFF) IMPLANT
DRAPE EXTREMITY T 121X128X90 (DRAPE) ×3 IMPLANT
DRAPE PROXIMA HALF (DRAPES) ×6 IMPLANT
DRAPE U-SHAPE 47X51 STRL (DRAPES) ×3 IMPLANT
DRSG ADAPTIC 3X8 NADH LF (GAUZE/BANDAGES/DRESSINGS) ×3 IMPLANT
DRSG PAD ABDOMINAL 8X10 ST (GAUZE/BANDAGES/DRESSINGS) ×3 IMPLANT
DURAPREP 26ML APPLICATOR (WOUND CARE) ×3 IMPLANT
ELECT REM PT RETURN 9FT ADLT (ELECTROSURGICAL) ×3
ELECTRODE REM PT RTRN 9FT ADLT (ELECTROSURGICAL) ×1 IMPLANT
GAUZE SPONGE 4X4 12PLY STRL (GAUZE/BANDAGES/DRESSINGS) ×3 IMPLANT
GLOVE BIOGEL PI IND STRL 9 (GLOVE) ×1 IMPLANT
GLOVE BIOGEL PI INDICATOR 9 (GLOVE) ×2
GLOVE SURG ORTHO 9.0 STRL STRW (GLOVE) ×3 IMPLANT
GOWN STRL REUS W/ TWL XL LVL3 (GOWN DISPOSABLE) ×2 IMPLANT
GOWN STRL REUS W/TWL XL LVL3 (GOWN DISPOSABLE) ×4
KIT BASIN OR (CUSTOM PROCEDURE TRAY) ×3 IMPLANT
KIT ROOM TURNOVER OR (KITS) ×3 IMPLANT
MANIFOLD NEPTUNE II (INSTRUMENTS) ×3 IMPLANT
NS IRRIG 1000ML POUR BTL (IV SOLUTION) ×3 IMPLANT
PACK GENERAL/GYN (CUSTOM PROCEDURE TRAY) ×3 IMPLANT
PAD ARMBOARD 7.5X6 YLW CONV (MISCELLANEOUS) ×6 IMPLANT
PADDING CAST COTTON 6X4 STRL (CAST SUPPLIES) ×3 IMPLANT
SPONGE LAP 18X18 X RAY DECT (DISPOSABLE) IMPLANT
STAPLER VISISTAT 35W (STAPLE) IMPLANT
STOCKINETTE IMPERVIOUS LG (DRAPES) ×3 IMPLANT
SUT ETHILON 2 0 PSLX (SUTURE) ×6 IMPLANT
SUT SILK 2 0 (SUTURE) ×2
SUT SILK 2-0 18XBRD TIE 12 (SUTURE) ×1 IMPLANT
SUT VIC AB 1 CTX 27 (SUTURE) ×6 IMPLANT
TOWEL OR 17X24 6PK STRL BLUE (TOWEL DISPOSABLE) ×3 IMPLANT
TOWEL OR 17X26 10 PK STRL BLUE (TOWEL DISPOSABLE) ×3 IMPLANT
WATER STERILE IRR 1000ML POUR (IV SOLUTION) ×3 IMPLANT

## 2015-06-03 NOTE — Care Management Note (Signed)
Case Management Note  Patient Details  Name: Hoy MornVergil T Portillo MRN: [id number] Date of Birth: Jun 14, 1961  Subjective/Objective:     54 yr old male admitted with a right foot infection, will undergo a right BKA today.             Action/Plan:  Patient had questions concerning getting a prosthesis. Case manager briefly discussed this with him and provided information regarding [facility]. Case Manager will continue to monitor.   Expected Discharge Date:   (UNKNOWN)               Expected Discharge Plan:  Home w Home Health Services  In-House Referral:     Discharge planning Services  CM Consult  Post Acute Care Choice:    Choice offered to:     DME Arranged:    DME Agency:     HH Arranged:    HH Agency:     Status of Service:  In process, will continue to follow  Medicare Important Message Given:    Date Medicare IM Given:    Medicare IM give by:    Date Additional Medicare IM Given:    Additional Medicare Important Message give by:     If discussed at Long Length of Stay Meetings, dates discussed:    Additional Comments:  Durenda GuthrieBrady, Naji Mehringer Naomi, RN 06/03/2015, 12:20 PM

## 2015-06-03 NOTE — Progress Notes (Signed)
ANTIBIOTIC CONSULT NOTE Pharmacy Consult for vancomycin  Indication: diabetic foot ulcer  No Known Allergies  Labs:  Recent Labs  06/01/15 1149 06/02/15 0250  WBC 14.5* 11.6*  HGB 14.0 12.9*  PLT 385 386  CREATININE 0.80 0.90   Estimated Creatinine Clearance: 105.4 mL/min (by C-G formula based on Cr of 0.9).  [facility]  [facility]/15 [facility]     Microbiology: Recent Results (from the past 720 hour(s))  Surgical pcr screen     Status: None   Collection Time: 06/02/15  7:31 PM  Result Value Ref Range Status   MRSA, PCR NEGATIVE NEGATIVE Final   Staphylococcus aureus NEGATIVE NEGATIVE Final    Comment:        The Xpert SA Assay (FDA approved for NASAL specimens in patients over 52 years of age), is one component of a comprehensive surveillance program.  Test performance has been validated by [facility] for patients greater than or equal to 52 year old. It is not intended to diagnose infection nor to guide or monitor treatment.     Medical History: Past Medical History  Diagnosis Date  . Sebaceous cyst     on back of neck  . Diabetes mellitus     type II  . Pulmonary embolism (HCC)   . Deep vein thrombosis (DVT) (HCC)   . Arthritis     bilateral hips  . 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 DJD (degenerative joint disease)   . Pulmonary emboli (HCC) 02/08/2014    Date of diagnosis February 08 2014, on chest CTA Hospitalized for 3 days Had some symptoms of shortness of breath, and chest pain With intercurrent DVT of the left LE. Duration of anticoagulation: 8 months. End date 10/11/2014.  Anticoagulant: Lovenox 120 units daily Switched to Eliquis on 05/25/2014 per patient preference    Assessment: [age] YOM with diabetic foot ulcer.    10/19 >> vancomycin >> 10/19 >> pip/tazo >>  Renal: SCr WNL WBC slightly elevated Vancomycin trough this AM therapeutic at 15  Goal of Therapy:  Vancomycin trough level 15-20 mcg/ml - treat as  osteomyelitis unless proven otherwise  Plan:  Continue Vancomycin 1 gram iv Q 8 hours Continue Zosyn - 4 hr infusion Continue to follow  Thank you Okey RegalLisa Bretton Tandy, PharmD [phone]  06/03/2015,12:02 PM

## 2015-06-03 NOTE — Progress Notes (Signed)
[facility] PROGRESS NOTE  Gregory May ZOX:[id number] DOB: 05-20-1961 DOA: 06/01/2015 PCP: Deneise Lever, MD  Assessment/Plan:  Principal Problem:   Right calcaneus osteomyelitis with leg cellulitis: patient in OR currently. Active Problems:   Pure hypercholesterolemia   Diabetes mellitus type 2, uncontrolled, with complications (HCC): On Lantus and meal coverage.  Better controlled. h/o  Pulmonary emboli (HCC)   H/o DVT (deep venous thrombosis) (HCC)   Osteoarthritis, hip, bilateral   DJD (degenerative joint disease) of knee   Diabetic polyneuropathy associated with type 2 diabetes mellitus (HCC)   will return to examine patient. Chart reviewed.  Code Status:  full Family Communication:   Disposition Plan:    Consultants:  Lajoyce Corners  Procedures:     Antibiotics:  vanc zosyn  HPI/Subjective: unable  Objective: Filed Vitals:   06/03/15 1550  BP: 115/81  Pulse: 69  Temp:   Resp: 12    Intake/Output Summary (Last 24 hours) at 06/03/15 1603 Last data filed at 06/03/15 1515  Gross per 24 hour  Intake    600 ml  Output   2550 ml  Net  -1950 ml   Filed Weights   06/01/15 2207  Weight: 88.86 kg (195 lb 14.4 oz)    Exam:    Basic Metabolic Panel:  Recent Labs Lab 06/01/15 1149 06/02/15 0250  NA 135 135  K 4.0 4.1  CL 103 102  CO2 25 27  GLUCOSE 215* 227*  BUN 13 10  CREATININE 0.80 0.90  CALCIUM 8.9 8.4*   Liver Function Tests:  Recent Labs Lab 06/01/15 1149  AST 35  ALT 48  ALKPHOS 78  BILITOT 0.4  PROT 7.5  ALBUMIN 3.3*   No results for input(s): LIPASE, AMYLASE in the last 168 hours. No results for input(s): AMMONIA in the last 168 hours. CBC:  Recent Labs Lab 06/01/15 1149 06/02/15 0250  WBC 14.5* 11.6*  NEUTROABS 10.2*  --   HGB 14.0 12.9*  HCT 42.1 40.3  MCV 91.9 93.5  PLT 385 386   Cardiac Enzymes: No results for input(s): CKTOTAL, CKMB, CKMBINDEX, TROPONINI in the last 168 hours. BNP (last 3  results) No results for input(s): BNP in the last 8760 hours.  ProBNP (last 3 results) No results for input(s): PROBNP in the last 8760 hours.  CBG:  Recent Labs Lab 06/02/15 2111 06/03/15 0644 06/03/15 1152 06/03/15 1300 06/03/15 1538  GLUCAP 207* 276* 93 93 101*    Recent Results (from the past 240 hour(s))  Surgical pcr screen     Status: None   Collection Time: 06/02/15  7:31 PM  Result Value Ref Range Status   MRSA, PCR NEGATIVE NEGATIVE Final   Staphylococcus aureus NEGATIVE NEGATIVE Final    Comment:        The Xpert SA Assay (FDA approved for NASAL specimens in patients over 20 years of age), is one component of a comprehensive surveillance program.  Test performance has been validated by [facility] for patients greater than or equal to 26 year old. It is not intended to diagnose infection nor to guide or monitor treatment.      Studies: Dg Chest 2 View  06/03/2015  CLINICAL DATA:  Preoperative for right below the knee a PTH EXAM: CHEST  2 VIEW COMPARISON:  04/28/2015 chest radiograph FINDINGS: Stable cardiomediastinal silhouette with normal heart size accounting for low lung volumes. No pneumothorax. No pleural effusion. Mild bibasilar atelectasis. No pulmonary edema. IMPRESSION: Low lung volumes with mild bibasilar atelectasis. Electronically Signed  By: Delbert PhenixJason A Poff M.D.   On: 06/03/2015 08:11    Scheduled Meds: . [MAR Hold] cholecalciferol  1,000 Units Oral Daily  . fentaNYL      . [MAR Hold] gabapentin  300 mg Oral TID  . [MAR Hold] insulin aspart  0-9 Units Subcutaneous TID WC  . [MAR Hold] insulin aspart  5 Units Subcutaneous TID WC  . [MAR Hold] insulin glargine  20 Units Subcutaneous QHS  . midazolam      . [MAR Hold] piperacillin-tazobactam (ZOSYN)  IV  3.375 g Intravenous 3 times per day  . [MAR Hold] pravastatin  40 mg Oral Daily  . [MAR Hold] vancomycin  1,000 mg Intravenous 3 times per day   Continuous Infusions: . sodium chloride 100  mL/hr at 06/02/15 2340  . lactated ringers 10 mL/hr at 06/03/15 1320    Time spent: 05 minutes  Varina Hulon L  [facility]  [email or web address], password [email or web address] 06/03/2015, 4:03 PM  LOS: 2 days

## 2015-06-03 NOTE — Transfer of Care (Signed)
Immediate Anesthesia Transfer of Care Note  Patient: Gregory May  Procedure(s) Performed: Procedure(s): Right Below Knee Amputation (Right)  Patient Location: PACU  Anesthesia Type:MAC and Regional  Level of Consciousness: awake, alert , oriented and patient cooperative  Airway & Oxygen Therapy: Patient Spontanous Breathing and Patient connected to face mask oxygen  Post-op Assessment: Report given to RN, Post -op Vital signs reviewed and stable and Patient moving all extremities  Post vital signs: Reviewed and stable  Last Vitals:  Filed Vitals:   06/03/15 1400  BP: 135/67  Pulse: 81  Temp:   Resp: 10    Complications: No apparent anesthesia complications

## 2015-06-03 NOTE — Anesthesia Preprocedure Evaluation (Signed)
Anesthesia Evaluation    Reviewed: Allergy & Precautions, NPO status , Patient's Chart, lab work & pertinent test results  History of Anesthesia Complications Negative for: history of anesthetic complications  Airway Mallampati: II  TM Distance: >3 FB Neck ROM: Full    Dental  (+) Teeth Intact,    Pulmonary neg pulmonary ROS,    breath sounds clear to auscultation       Cardiovascular negative cardio ROS   Rhythm:Regular     Neuro/Psych  Neuromuscular disease negative psych ROS   GI/Hepatic negative GI ROS, Neg liver ROS,   Endo/Other  diabetes, Poorly Controlled, Type 2, Insulin Dependent  Renal/GU negative Renal ROS     Musculoskeletal  (+) Arthritis ,   Abdominal   Peds  Hematology negative hematology ROS (+)   Anesthesia Other Findings   Reproductive/Obstetrics                             Anesthesia Physical Anesthesia Plan  ASA: III  Anesthesia Plan: MAC and Regional   Post-op Pain Management:    Induction:   Airway Management Planned: Nasal Cannula  Additional Equipment: None  Intra-op Plan:   Post-operative Plan:   Informed Consent: I have reviewed the patients History and Physical, chart, labs and discussed the procedure including the risks, benefits and alternatives for the proposed anesthesia with the patient or authorized representative who has indicated his/her understanding and acceptance.   Dental advisory given  Plan Discussed with: CRNA and Surgeon  Anesthesia Plan Comments:         Anesthesia Quick Evaluation

## 2015-06-03 NOTE — Anesthesia Procedure Notes (Addendum)
Anesthesia Regional Block:  Popliteal block  Pre-Anesthetic Checklist: ,, timeout performed, Correct Patient, Correct Site, Correct Laterality, Correct Procedure, Correct Position, site marked, Risks and benefits discussed, Surgical consent,  Pre-op evaluation,  At surgeon's request  Laterality: Lower and Right  Prep: chloraprep       Needles:  Injection technique: Single-shot  Needle Type: Echogenic Stimulator Needle          Additional Needles:  Procedures: ultrasound guided (picture in chart) and nerve stimulator Popliteal block  Nerve Stimulator or Paresthesia:  Response: plantar, 0.5 mA,   Additional Responses:   Narrative:  Injection made incrementally with aspirations every 5 mL.  Performed by: Personally  Anesthesiologist: MOSER, CHRISTOPHER  Additional Notes: H+P and labs reviewed, risks and benefits discussed with patient, procedure tolerated well without complications   Anesthesia Regional Block:  Femoral nerve block  Pre-Anesthetic Checklist: ,, timeout performed, Correct Patient, Correct Site, Correct Laterality, Correct Procedure, Correct Position, site marked, Risks and benefits discussed, Surgical consent,  Pre-op evaluation,  At surgeon's request  Laterality: Lower and Right  Prep: chloraprep       Needles:  Injection technique: Single-shot  Needle Type: Echogenic Stimulator Needle          Additional Needles:  Procedures: ultrasound guided (picture in chart) and nerve stimulator Femoral nerve block  Nerve Stimulator or Paresthesia:  Response: quad, 0.5 mA,   Additional Responses:   Narrative:  Injection made incrementally with aspirations every 5 mL.  Performed by: Personally  Anesthesiologist: MOSER, CHRISTOPHER  Additional Notes: H+P and labs reviewed, risks and benefits discussed with patient, procedure tolerated well without complications   Procedure Name: MAC Date/Time: 06/03/2015 2:50 PM Performed by: Coralee RudFLORES,  Sheldon Sem Ventilation: Oral airway inserted - appropriate to patient size Placement Confirmation: positive ETCO2

## 2015-06-03 NOTE — H&P (View-Only) (Signed)
Reason for Consult: Osteomyelitis with draining ulcer 8 ascending cellulitis right calcaneus Referring Physician: ER physician  Gregory May is an 54 y.o. male.  HPI: Patient is a 54 year old gentleman well known to me with diabetic insensate neuropathy. He recently patient was recommended to proceed with a transtibial amputation secondary to the chronic osteomyelitis of the right calcaneus. Patient went to [facility] and was recommended to proceed with hyperbaric oxygen. Patient underwent continued oral antibiotics hyperbaric oxygen dives 6 with persistent osteomyelitis of the calcaneus now with a sending cellulitis up his leg.  Past Medical History  Diagnosis Date  . Sebaceous cyst     on back of neck  . Diabetes mellitus     type II  . Pulmonary embolism ([place])   . Deep vein thrombosis (DVT) ([place])   . Arthritis     bilateral hips  . 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 DJD (degenerative joint disease)   . Pulmonary emboli ([place]) 02/08/2014    Date of diagnosis February 08 2014, on chest CTA Hospitalized for 3 days Had some symptoms of shortness of breath, and chest pain With intercurrent DVT of the left LE. Duration of anticoagulation: 8 months. End date 10/11/2014.  Anticoagulant: Lovenox 120 units daily Switched to Eliquis on 05/25/2014 per patient preference     Past Surgical History  Procedure Laterality Date  . Rotator cuff repair  2005 (approx)    right   . Closed reduction with humer pin insertion  1974    left hip  . Joint replacement  2006 (approx)    right hip replaced  . 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 Total hip arthroplasty Left 07/21/2014    Procedure: TOTAL HIP ARTHROPLASTY ANTERIOR APPROACH;  Surgeon: Ninetta Lights, MD;  Location: [place];  Service: Orthopedics;  Laterality: Left;  . Hardware removal Left 07/21/2014    Procedure: HARDWARE REMOVAL;  Surgeon: Ninetta Lights, MD;  Location: [place];  Service: Orthopedics;  Laterality:  Left;    Family History  Problem Relation Age of Onset  . Cancer Mother     breast, colon, liver  . Diabetes Father     Social History:  reports that he has never smoked. He has never used smokeless tobacco. He reports that he drinks alcohol. He reports that he does not use illicit drugs.  Allergies: No Known Allergies  Medications: I have reviewed the patient's current medications.  Results for orders placed or performed during the hospital encounter of 06/01/15 (from the past 48 hour(s))  Comprehensive metabolic panel     Status: Abnormal   Collection Time: 06/01/15 11:49 AM  Result Value Ref Range   Sodium 135 135 - 145 mmol/L   Potassium 4.0 3.5 - 5.1 mmol/L   Chloride 103 101 - 111 mmol/L   CO2 25 22 - 32 mmol/L   Glucose, Bld 215 (H) 65 - 99 mg/dL   BUN 13 6 - 20 mg/dL   Creatinine, Ser 0.80 0.61 - 1.24 mg/dL   Calcium 8.9 8.9 - 10.3 mg/dL   Total Protein 7.5 6.5 - 8.1 g/dL   Albumin 3.3 (L) 3.5 - 5.0 g/dL   AST 35 15 - 41 U/L   ALT 48 17 - 63 U/L   Alkaline Phosphatase 78 38 - 126 U/L   Total Bilirubin 0.4 0.3 - 1.2 mg/dL   GFR calc non Af Amer >60 >60 mL/min   GFR calc  Af Amer >60 >60 mL/min    Comment: (NOTE) The eGFR has been calculated using the CKD EPI equation. This calculation has not been validated in all clinical situations. eGFR's persistently <60 mL/min signify possible Chronic Kidney Disease.    Anion gap 7 5 - 15  CBC with Differential     Status: Abnormal   Collection Time: 06/01/15 11:49 AM  Result Value Ref Range   WBC 14.5 (H) 4.0 - 10.5 K/uL   RBC 4.58 4.22 - 5.81 MIL/uL   Hemoglobin 14.0 13.0 - 17.0 g/dL   HCT 42.1 39.0 - 52.0 %   MCV 91.9 78.0 - 100.0 fL   MCH 30.6 26.0 - 34.0 pg   MCHC 33.3 30.0 - 36.0 g/dL   RDW 13.7 11.5 - 15.5 %   Platelets 385 150 - 400 K/uL   Neutrophils Relative % 71 %   Neutro Abs 10.2 (H) 1.7 - 7.7 K/uL   Lymphocytes Relative 16 %   Lymphs Abs 2.4 0.7 - 4.0 K/uL   Monocytes Relative 12 %   Monocytes  Absolute 1.8 (H) 0.1 - 1.0 K/uL   Eosinophils Relative 1 %   Eosinophils Absolute 0.1 0.0 - 0.7 K/uL   Basophils Relative 0 %   Basophils Absolute 0.0 0.0 - 0.1 K/uL  Urinalysis, Routine w reflex microscopic (not at [facility])     Status: Abnormal   Collection Time: 06/01/15 12:00 PM  Result Value Ref Range   Color, Urine YELLOW YELLOW   APPearance CLEAR CLEAR   Specific Gravity, Urine 1.017 1.005 - 1.030   pH 6.0 5.0 - 8.0   Glucose, UA >1000 (A) NEGATIVE mg/dL   Hgb urine dipstick NEGATIVE NEGATIVE   Bilirubin Urine NEGATIVE NEGATIVE   Ketones, ur NEGATIVE NEGATIVE mg/dL   Protein, ur NEGATIVE NEGATIVE mg/dL   Urobilinogen, UA 1.0 0.0 - 1.0 mg/dL   Nitrite NEGATIVE NEGATIVE   Leukocytes, UA NEGATIVE NEGATIVE  I-Stat CG4 Lactic Acid, ED (Not at [facility])     Status: None   Collection Time: 06/01/15 12:00 PM  Result Value Ref Range   Lactic Acid, Venous 1.25 0.5 - 2.0 mmol/L  Urine microscopic-add on     Status: None   Collection Time: 06/01/15 12:00 PM  Result Value Ref Range   WBC, UA 0-2 <3 WBC/hpf  I-Stat CG4 Lactic Acid, ED (Not at [facility])     Status: None   Collection Time: 06/01/15  2:36 PM  Result Value Ref Range   Lactic Acid, Venous 0.70 0.5 - 2.0 mmol/L  CBG monitoring, ED     Status: Abnormal   Collection Time: 06/01/15  5:42 PM  Result Value Ref Range   Glucose-Capillary 124 (H) 65 - 99 mg/dL    Dg Foot Complete Right  06/01/2015  CLINICAL DATA:  Diabetic ulcer plantar past RIGHT foot at calcaneus question osteomyelitis, type II diabetes mellitus, diabetic polyneuropathy EXAM: RIGHT FOOT COMPLETE - 3+ VIEW COMPARISON:  09/15/2013 FINDINGS: Osseous demineralization. Joint spaces preserved. Scattered soft tissue swelling. Small soft tissue wound/ulcer at plantar aspect of calcaneus. Mild cortical irregularity along the plantar aspect of the posterior calcaneus similar to previous exam. No definite acute fracture, dislocation or new bone destruction. Scattered small vessel  vascular calcification. IMPRESSION: Chronic cortical irregularity at plantar aspect of posterior calcaneus little changed from previous exam without definite evidence of acute bone destruction to suggest acute osteomyelitis. If osteomyelitis remains a clinical concern, recommend followup MR imaging. Electronically Signed   By: Crist Infante.D.  On: 06/01/2015 14:53    Review of Systems  All other systems reviewed and are negative.  Blood pressure 126/88, pulse 77, temperature 98.1 F (36.7 C), temperature source Oral, resp. rate 18, SpO2 98 %. Physical Exam On examination patient has a purulent drainage from a ulcer on the right calcaneus. The ulcers 2 cm in diameter approximate 2 cm deep. The bone of the calcaneus can be toe-touch through the wound. Patient has cellulitis up to the medial aspect of his leg just below the patella. Review of the radiographs shows chronic osteomyelitis of the calcaneus. Assessment/Plan: Assessment: Chronic osteomyelitis right calcaneus with ascending cellulitis despite multiple courses of oral antibiotics and hyperbaric oxygen.  Plan: I feel the patient's only option is to proceed with IV antibiotics at this time plan for transtibial amputation on Friday afternoon. Plan for surgery at [facility]. Transferred to [facility] for hospitalist to manage patient's diabetes and antibiotics.  DUDA,MARCUS V 06/01/2015, 7:11 PM

## 2015-06-03 NOTE — Op Note (Signed)
   Date of Surgery: 06/03/2015  INDICATIONS: Mr. Gregory May is a 54 y.o.-year-old male who has had chronic osteomyelitis of the right calcaneus. Patient has failed prolonged conservative therapy including antibiotics debridement hyperbaric oxygen and presents at this time for transtibial amputation due to ascending cellulitis.Marland Kitchen.  PREOPERATIVE DIAGNOSIS: Osteomyelitis abscess ulceration right heel  POSTOPERATIVE DIAGNOSIS: Same.  PROCEDURE: Transtibial amputation right  SURGEON: Lajoyce Cornersuda, M.D.  ANESTHESIA:  general  IV FLUIDS AND URINE: See anesthesia.  ESTIMATED BLOOD LOSS: Minimal mL.  COMPLICATIONS: None.  DESCRIPTION OF PROCEDURE: The patient was brought to the operating room and underwent a general anesthetic. After adequate levels of anesthesia were obtained patient's lower extremity was prepped using DuraPrep draped into a sterile field. A timeout was called.  A transverse incision was made 11 cm distal to the tibial tubercle. This curved proximally and a large posterior flap was created. The tibia was transected 1 cm proximal to the skin incision. The fibula was transected just proximal to the tibial incision. The tibia was beveled anteriorly. A large posterior flap was created. The sciatic nerve was pulled cut and allowed to retract. The vascular bundles were suture ligated with 2-0 silk. The deep and superficial fascial layers were closed using #1 Vicryl. The skin was closed using staples and 2-0 nylon. The wound was covered with Adaptic orthopedic sponges AB dressing Kerlix and Coban. Patient was extubated taken to the PACU in stable condition.  [name], MD [facility] 3:35 PM

## 2015-06-03 NOTE — Interval H&P Note (Signed)
History and Physical Interval Note:  06/03/2015 6:48 AM  Gregory May  has presented today for surgery, with the diagnosis of Osteomyelitis Right Heel  The various methods of treatment have been discussed with the patient and family. After consideration of risks, benefits and other options for treatment, the patient has consented to  Procedure(s): Right Below Knee Amputation (Right) as a surgical intervention .  The patient's history has been reviewed, patient examined, no change in status, stable for surgery.  I have reviewed the patient's chart and labs.  Questions were answered to the patient's satisfaction.     DUDA,MARCUS V

## 2015-06-04 LAB — GLUCOSE, CAPILLARY
Glucose-Capillary: 255 mg/dL — ABNORMAL HIGH (ref 65–99)
Glucose-Capillary: 167 mg/dL — ABNORMAL HIGH (ref 65–99)
Glucose-Capillary: 169 mg/dL — ABNORMAL HIGH (ref 65–99)
Glucose-Capillary: 204 mg/dL — ABNORMAL HIGH (ref 65–99)

## 2015-06-04 MED ORDER — INSULIN GLARGINE 100 UNIT/ML ~~LOC~~ SOLN
22.0000 [IU] | Freq: Every day | SUBCUTANEOUS | Status: DC
  Administered 2015-06-04: 22 [IU] via SUBCUTANEOUS
  Filled 2015-06-04 (×2): qty 0.22

## 2015-06-04 NOTE — Progress Notes (Signed)
[facility] PROGRESS NOTE  Gregory May WUJ:[id number] DOB: 1961-01-28 DOA: 06/01/2015 PCP: Deneise Lever, MD  Assessment/Plan:  Principal Problem:   Right calcaneus osteomyelitis: s/p transtibial amputation. Would be a good cir candidate. Agree with consult. Continue PT, OT. D/c abx Active Problems:   Pure hypercholesterolemia   Diabetes mellitus type 2, uncontrolled, with complications (HCC): On Lantus and meal coverage.  Increase lantus to 22 units h/o  Pulmonary emboli (HCC)   H/o DVT (deep venous thrombosis) (HCC)   Osteoarthritis, hip, bilateral   DJD (degenerative joint disease) of knee   Diabetic polyneuropathy associated with type 2 diabetes mellitus (HCC)  Code Status:  full Family Communication:   Disposition Plan:    Consultants:  Duda  Procedures:   TTA, right  Antibiotics:  vanc zosyn  HPI/Subjective: Pain controlled. Agrees with rehab consult  Objective: Filed Vitals:   06/04/15 1330  BP: 133/79  Pulse: 99  Temp: 98.7 F (37.1 C)  Resp: 17    Intake/Output Summary (Last 24 hours) at 06/04/15 1336 Last data filed at 06/04/15 1306  Gross per 24 hour  Intake   1420 ml  Output   3650 ml  Net  -2230 ml   Filed Weights   06/01/15 2207  Weight: 88.86 kg (195 lb 14.4 oz)    Exam:  Gen: a and o. Talkative Lungs: CTA without WRR CV RRR without MGR abd S, NT, ND Ext dressing on right stump CDI  Basic Metabolic Panel:  Recent Labs Lab 06/01/15 1149 06/02/15 0250  NA 135 135  K 4.0 4.1  CL 103 102  CO2 25 27  GLUCOSE 215* 227*  BUN 13 10  CREATININE 0.80 0.90  CALCIUM 8.9 8.4*   Liver Function Tests:  Recent Labs Lab 06/01/15 1149  AST 35  ALT 48  ALKPHOS 78  BILITOT 0.4  PROT 7.5  ALBUMIN 3.3*   No results for input(s): LIPASE, AMYLASE in the last 168 hours. No results for input(s): AMMONIA in the last 168 hours. CBC:  Recent Labs Lab 06/01/15 1149 06/02/15 0250  WBC 14.5* 11.6*  NEUTROABS 10.2*   --   HGB 14.0 12.9*  HCT 42.1 40.3  MCV 91.9 93.5  PLT 385 386   Cardiac Enzymes: No results for input(s): CKTOTAL, CKMB, CKMBINDEX, TROPONINI in the last 168 hours. BNP (last 3 results) No results for input(s): BNP in the last 8760 hours.  ProBNP (last 3 results) No results for input(s): PROBNP in the last 8760 hours.  CBG:  Recent Labs Lab 06/03/15 1300 06/03/15 1538 06/03/15 2113 06/04/15 0623 06/04/15 1202  GLUCAP 93 101* 292* 255* 167*    Recent Results (from the past 240 hour(s))  Blood culture (routine x 2)     Status: None (Preliminary result)   Collection Time: 06/01/15  9:04 PM  Result Value Ref Range Status   Specimen Description BLOOD BLOOD LEFT FOREARM  Final   Special Requests BOTTLES DRAWN AEROBIC AND ANAEROBIC  Final   Culture   Final    NO GROWTH 2 DAYS Performed at [facility]    Report Status PENDING  Incomplete  Blood culture (routine x 2)     Status: None (Preliminary result)   Collection Time: 06/01/15  9:05 PM  Result Value Ref Range Status   Specimen Description BLOOD BLOOD RIGHT FOREARM  Final   Special Requests BOTTLES DRAWN AEROBIC AND ANAEROBIC  Final   Culture   Final    NO GROWTH 2 DAYS  [facility]    Report Status PENDING  Incomplete  Surgical pcr screen     Status: None   Collection Time: 06/02/15  7:31 PM  Result Value Ref Range Status   MRSA, PCR NEGATIVE NEGATIVE Final   Staphylococcus aureus NEGATIVE NEGATIVE Final    Comment:        The Xpert SA Assay (FDA approved for NASAL specimens in patients over 52 years of age), is one component of a comprehensive surveillance program.  Test performance has been validated by [facility] for patients greater than or equal to 52 year old. It is not intended to diagnose infection nor to guide or monitor treatment.      Studies: Dg Chest 2 View  06/03/2015  CLINICAL DATA:  Preoperative for right below the knee a PTH EXAM: CHEST  2 VIEW  COMPARISON:  04/28/2015 chest radiograph FINDINGS: Stable cardiomediastinal silhouette with normal heart size accounting for low lung volumes. No pneumothorax. No pleural effusion. Mild bibasilar atelectasis. No pulmonary edema. IMPRESSION: Low lung volumes with mild bibasilar atelectasis. Electronically Signed   By: Delbert PhenixJason A Poff M.D.   On: 06/03/2015 08:11    Scheduled Meds: . cholecalciferol  1,000 Units Oral Daily  . gabapentin  300 mg Oral TID  . insulin aspart  0-9 Units Subcutaneous TID WC  . insulin aspart  5 Units Subcutaneous TID WC  . insulin glargine  20 Units Subcutaneous QHS  . piperacillin-tazobactam (ZOSYN)  IV  3.375 g Intravenous 3 times per day  . pravastatin  40 mg Oral Daily  . vancomycin  1,000 mg Intravenous 3 times per day   Continuous Infusions: . sodium chloride    . lactated ringers 10 mL/hr at 06/03/15 1320    Time spent: 25 minutes  [name]  [facility]  [email or web address], password [email or web address] 06/04/2015, 1:36 PM  LOS: 3 days

## 2015-06-04 NOTE — Progress Notes (Signed)
Physical Therapy Evaluation Patient Details Name: Gregory May MRN: [id number] DOB: November 21, 1960 Today's Date: 06/04/2015   History of Present Illness  54 y.o. male with PMH of hyperlipidemia, diabetes mellitus, DVT, PE, arthritis, DJD, who presented with right foot heel ulcer and pain. Pt now s/p Rt BKA.  Clinical Impression  Patient with extensive history of R LE wound, now resolved by amputation.  Patient Supervision for bed level mobility, MIN/MOD assist for transfers and gait using crutches for within room distances today.  Limited by pain at this time, patient is good rehab candidate, has several education and mobility needs immediately due to amputation, but anticipated to be independent with crutches again soon.  Patient will benefit from continuation of skilled PT services.    Follow Up Recommendations CIR;Supervision/Assistance - 24 hour    Equipment Recommendations  None recommended by PT (Has crutches)    Recommendations for Other Services Rehab consult     Precautions / Restrictions Precautions Precautions: Fall Restrictions Weight Bearing Restrictions: Yes RLE Weight Bearing: Non weight bearing      Mobility  Bed Mobility Overal bed mobility: Needs Assistance Bed Mobility: Supine to Sit     Supine to sit: Supervision        Transfers Overall transfer level: Needs assistance Equipment used: Crutches Transfers: Sit to/from Stand Sit to Stand: Min assist;Mod assist         General transfer comment: Crutch in R UE, L UE push off from bed/chair  Ambulation/Gait Ambulation/Gait assistance: Min assist Ambulation Distance (Feet): 20 Feet Assistive device: Crutches       General Gait Details: Swing through pattern.  Stairs            Wheelchair Mobility    Modified Rankin (Stroke Patients Only)       Balance Overall balance assessment: Needs assistance   Sitting balance-Leahy Scale: Good     Standing balance support: Single  extremity supported;Bilateral upper extremity supported Standing balance-Leahy Scale: Poor Standing balance comment: Good use of crutches.                             Pertinent Vitals/Pain Pain Assessment: 0-10 Pain Score: 7  Pain Location: Rt LE below knee Pain Descriptors / Indicators: Aching;Burning Pain Intervention(s): Limited activity within patient's tolerance;Monitored during session;Patient requesting pain meds-RN notified;Repositioned    Home Living Family/patient expects to be discharged to:: Unsure [facility] for CIR, if able) Living Arrangements: Other relatives (lives with brother) Available Help at Discharge: Family;Available PRN/intermittently (brother works 3rd shift; sleeps during day) Type of Home: House Home Access: Stairs to enter Entrance Stairs-Rails: Right Entrance Stairs-Number of Steps: 4; pt has 3 steps with rail and additional step with no rail Home Layout: One level Home Equipment: Adaptive equipment;Bedside commode;Crutches (uses lounge chair in shower) Additional Comments: pt reports his house has a lot of clutter    Prior Function Level of Independence: Independent with assistive device(s)         Comments: uses crutches     Hand Dominance   Dominant Hand: Right    Extremity/Trunk Assessment   Upper Extremity Assessment: Overall WFL for tasks assessed           Lower Extremity Assessment: RLE deficits/detail;Overall WFL for tasks assessed RLE Deficits / Details: R BKA       Communication   Communication: No difficulties  Cognition Arousal/Alertness: Awake/alert Behavior During Therapy: WFL for tasks assessed/performed Overall Cognitive Status: Within Functional Limits  for tasks assessed                      General Comments General comments (skin integrity, edema, etc.): R LE wrapped, unable to visualize    Exercises Amputee Exercises Quad Sets: AROM;Right;10 reps Hip ABduction/ADduction: AROM;Right;10  reps Knee Extension: AROM;Right;10 reps      Assessment/Plan    PT Assessment Patient needs continued PT services  PT Diagnosis Difficulty walking;Abnormality of gait;Acute pain   PT Problem List Decreased activity tolerance;Decreased balance;Decreased mobility;Pain  PT Treatment Interventions DME instruction;Gait training;Stair training;Functional mobility training;Therapeutic activities;Therapeutic exercise;Balance training;Patient/family education;Wheelchair mobility training   PT Goals (Current goals can be found in the Care Plan section) Acute Rehab PT Goals Patient Stated Goal: not stated PT Goal Formulation: With patient Time For Goal Achievement: 06/18/15 Potential to Achieve Goals: Good    Frequency Min 3X/week   Barriers to discharge Inaccessible home environment;Decreased caregiver support Stairs to enter, brother works 3rd shift    Co-evaluation               End of Session Equipment Utilized During Treatment: Gait belt Activity Tolerance: Patient tolerated treatment well;Patient limited by pain Patient left: in bed;with call bell/phone within reach Nurse Communication: Mobility status;Precautions         Time: 1545-1620 PT Time Calculation (min) (ACUTE ONLY): 35 min   Charges:   PT Evaluation $Initial PT Evaluation Tier I: 1 Procedure PT Treatments $Gait Training: 8-22 mins   PT G CodesFreida Busman:        Bernetta Sutley L 06/04/2015, 6:06 PM

## 2015-06-04 NOTE — Evaluation (Signed)
Occupational Therapy Evaluation Patient Details Name: Gregory May MRN: [id number] DOB: 11-15-1960 Today's Date: 06/04/2015    History of Present Illness 54 y.o. male with PMH of hyperlipidemia, diabetes mellitus, DVT, PE, arthritis, DJD, who presented with right foot heel ulcer and pain. Pt now s/p Rt BKA.   Clinical Impression   Pt s/p above. Pt independent with ADLs, PTA. Feel pt will benefit from acute OT to increase independence prior to d/c. Recommending CIR consult.    Follow Up Recommendations  CIR    Equipment Recommendations  None recommended by OT    Recommendations for Other Services Rehab consult     Precautions / Restrictions Restrictions Weight Bearing Restrictions: Yes RLE Weight Bearing: Non weight bearing      Mobility Bed Mobility Overal bed mobility: Needs Assistance Bed Mobility: Supine to Sit     Supine to sit: Supervision        Transfers Overall transfer level: Needs assistance Equipment used: Crutches Transfers: Sit to/from Stand Sit to Stand: Mod assist      Comments: Discussed placement of crutches.        Balance    Assist given for standing balance while he was transitioning crutches when standing from bed.                                        ADL Overall ADL's : Needs assistance/impaired                     Lower Body Dressing: Sitting/lateral leans;Set up;Supervision/safety   Toilet Transfer: Ambulation;Moderate assistance (ambulated-min guard with crutches; sit to stand-Mod assist)   Toileting- Clothing Manipulation and Hygiene: Min guard (standing)       Functional mobility during ADLs: Min guard (crutches) General ADL Comments: Discussed desensitization techniques for RLE as well as positioning of pillows. Discussed safety such as safe footwear and clutter.     Vision     Perception     Praxis      Pertinent Vitals/Pain Pain Assessment: 0-10 Pain Score:  (7-8) Pain  Location: Rt hip Pain Intervention(s): Monitored during session;Repositioned     Hand Dominance     Extremity/Trunk Assessment Upper Extremity Assessment Upper Extremity Assessment: Overall WFL for tasks assessed   Lower Extremity Assessment Lower Extremity Assessment: Defer to PT evaluation       Communication Communication Communication: No difficulties   Cognition Arousal/Alertness: Awake/alert Behavior During Therapy: WFL for tasks assessed/performed Overall Cognitive Status: Within Functional Limits for tasks assessed                     General Comments       Exercises       Shoulder Instructions      Home Living Family/patient expects to be discharged to::  (sounds like he is hoping to go to CIR) Living Arrangements: Other relatives (lives with brother) Available Help at Discharge: Family;Available PRN/intermittently (brother works 3rd shift; sleeps during day) Type of Home: House Home Access: Stairs to enter [employer] of Steps: 4; pt has 3 steps with rail and additional step with no rail Entrance Stairs-Rails: Right Home Layout: One level     Bathroom Shower/Tub: Chief Strategy Officer: Standard     Home Equipment: Merchant navy officer;Bedside commode;Crutches (uses lounge chair in shower) Adaptive Equipment: Reacher;Sock aid Additional Comments: pt reports his house has a lot  of clutter      Prior Functioning/Environment Level of Independence: Independent with assistive device(s)        Comments: uses crutches    OT Diagnosis: Acute pain   OT Problem List: Impaired sensation;Pain;Decreased knowledge of use of DME or AE;Decreased knowledge of precautions;Impaired balance (sitting and/or standing);Decreased activity tolerance   OT Treatment/Interventions: Self-care/ADL training;DME and/or AE instruction;Therapeutic activities;Patient/family education;Balance training    OT Goals(Current goals can be found in the  care plan section) Acute Rehab OT Goals Patient Stated Goal: not stated OT Goal Formulation: With patient Time For Goal Achievement: 06/11/15 Potential to Achieve Goals: Good ADL Goals Pt Will Perform Lower Body Dressing: with set-up;sit to/from stand Pt Will Transfer to Toilet: with supervision;ambulating Pt Will Perform Toileting - Clothing Manipulation and hygiene: sit to/from stand;with supervision Pt Will Perform Tub/Shower Transfer: Tub transfer;with supervision;ambulating;shower seat  OT Frequency: Min 2X/week   Barriers to D/C:            Co-evaluation              End of Session Equipment Utilized During Treatment: Gait belt;Other (comment) (crutches)  Activity Tolerance: Patient tolerated treatment well Patient left: in bed;with call bell/phone within reach;with bed alarm set   Time: [id number] OT Time Calculation (min): 20 min Charges:  OT General Charges $OT Visit: 1 Procedure OT Evaluation $Initial OT Evaluation Tier I: 1 Procedure G-CodesEarlie Raveling:    Hampton Cost L OTR/L [phone] 06/04/2015, 11:19 AM

## 2015-06-04 NOTE — Progress Notes (Signed)
Patient ID: Gregory May, male   DOB: [birth year], 54 y.o.   MRN: [id number] Postoperative day 1 right transtibial amputation. Patient has been up with his crutches. Orders are written for inpatient versus  outpatient rehabilitation. Patient lives alone at home I do not know if he is safe for discharge to independent living.

## 2015-06-05 LAB — GLUCOSE, CAPILLARY
Glucose-Capillary: 235 mg/dL — ABNORMAL HIGH (ref 65–99)
Glucose-Capillary: 228 mg/dL — ABNORMAL HIGH (ref 65–99)
Glucose-Capillary: 211 mg/dL — ABNORMAL HIGH (ref 65–99)
Glucose-Capillary: 260 mg/dL — ABNORMAL HIGH (ref 65–99)

## 2015-06-05 MED ORDER — INSULIN GLARGINE 100 UNIT/ML ~~LOC~~ SOLN
25.0000 [IU] | Freq: Every day | SUBCUTANEOUS | Status: DC
  Administered 2015-06-05: 25 [IU] via SUBCUTANEOUS
  Filled 2015-06-05 (×2): qty 0.25

## 2015-06-05 MED ORDER — INSULIN ASPART 100 UNIT/ML ~~LOC~~ SOLN
8.0000 [IU] | Freq: Three times a day (TID) | SUBCUTANEOUS | Status: DC
  Administered 2015-06-05 – 2015-06-06 (×3): 8 [IU] via SUBCUTANEOUS

## 2015-06-05 NOTE — Progress Notes (Signed)
Patient ID: Gregory May, male   DOB: [birth year], 54 y.o.   MRN: [id number] Postoperative day 2 right transtibial amputation. Examination the dressing is clean and dry patient does complain of some burning pain recommend that he try some ice. Patient is to be evaluated for inpatient rehabilitation.

## 2015-06-05 NOTE — Anesthesia Postprocedure Evaluation (Signed)
  Anesthesia Post-op Note  Patient: Gregory May  Procedure(s) Performed: Procedure(s): Right Below Knee Amputation (Right)  Patient Location: PACU  Anesthesia Type:MAC and Regional  Level of Consciousness: awake  Airway and Oxygen Therapy: Patient Spontanous Breathing  Post-op Pain: none  Post-op Assessment: Post-op Vital signs reviewed, Patient's Cardiovascular Status Stable, Respiratory Function Stable, Patent Airway, No signs of Nausea or vomiting and Pain level controlled              Post-op Vital Signs: Reviewed and stable  Last Vitals:  Filed Vitals:   06/05/15 0512  BP: 104/70  Pulse: 88  Temp: 37.1 C  Resp: 18    Complications: No apparent anesthesia complications

## 2015-06-05 NOTE — Progress Notes (Signed)
[facility] PROGRESS NOTE  Gregory May:[id number] DOB: 22-Feb-1961 DOA: 06/01/2015 PCP: [name], MD  Summary 54 yo male with DM, right calcaneal osteomyelitis admitted with ascending leg cellulitis. Known to Dr. Lajoyce Cornersuda. Had transtibial amputation  Assessment/Plan:  Principal Problem:   Right calcaneus osteomyelitis: s/p transtibial amputation. Would be a good cir candidate. Agree with consult. Continue PT, OT. D/c abx Active Problems:   Pure hypercholesterolemia   Diabetes mellitus type 2, uncontrolled, with complications (HCC): On Lantus and meal coverage.  Increase lantus to 25 units. Increase meal coverage to 8 units h/o  Pulmonary emboli (HCC)   H/o DVT (deep venous thrombosis) (HCC)   Osteoarthritis, hip, bilateral   DJD (degenerative joint disease) of knee   Diabetic polyneuropathy associated with type 2 diabetes mellitus (HCC) UDS positive for cocaine  Code Status:  full Family Communication:  freinds at bedside Disposition Plan:  CIR?  Consultants:  Lajoyce Cornersuda  Procedures:   TTA, right  Antibiotics:  vanc zosyn  HPI/Subjective: Pain controlled. Agrees with rehab consult  Objective: Filed Vitals:   06/05/15 0512  BP: 104/70  Pulse: 88  Temp: 98.7 F (37.1 C)  Resp: 18    Intake/Output Summary (Last 24 hours) at 06/05/15 1210 Last data filed at 06/05/15 1026  Gross per 24 hour  Intake    960 ml  Output   2500 ml  Net  -1540 ml   Filed Weights   06/01/15 2207  Weight: 88.86 kg (195 lb 14.4 oz)    Exam:  Gen: a and o. Talkative Lungs: CTA without WRR CV RRR without MGR abd S, NT, ND Ext dressing on right stump CDI  Basic Metabolic Panel:  Recent Labs Lab 06/01/15 1149 06/02/15 0250  NA 135 135  K 4.0 4.1  CL 103 102  CO2 25 27  GLUCOSE 215* 227*  BUN 13 10  CREATININE 0.80 0.90  CALCIUM 8.9 8.4*   Liver Function Tests:  Recent Labs Lab 06/01/15 1149  AST 35  ALT 48  ALKPHOS 78  BILITOT 0.4  PROT 7.5   ALBUMIN 3.3*   No results for input(s): LIPASE, AMYLASE in the last 168 hours. No results for input(s): AMMONIA in the last 168 hours. CBC:  Recent Labs Lab 06/01/15 1149 06/02/15 0250  WBC 14.5* 11.6*  NEUTROABS 10.2*  --   HGB 14.0 12.9*  HCT 42.1 40.3  MCV 91.9 93.5  PLT 385 386   Cardiac Enzymes: No results for input(s): CKTOTAL, CKMB, CKMBINDEX, TROPONINI in the last 168 hours. BNP (last 3 results) No results for input(s): BNP in the last 8760 hours.  ProBNP (last 3 results) No results for input(s): PROBNP in the last 8760 hours.  CBG:  Recent Labs Lab 06/04/15 1202 06/04/15 1609 06/04/15 2108 06/05/15 0633 06/05/15 1126  GLUCAP 167* 169* 204* 235* 228*    Recent Results (from the past 240 hour(s))  Blood culture (routine x 2)     Status: None (Preliminary result)   Collection Time: 06/01/15  9:04 PM  Result Value Ref Range Status   Specimen Description BLOOD BLOOD LEFT FOREARM  Final   Special Requests BOTTLES DRAWN AEROBIC AND ANAEROBIC 5ML  Final   Culture   Final    NO GROWTH 3 DAYS [facility]    Report Status PENDING  Incomplete  Blood culture (routine x 2)     Status: None (Preliminary result)   Collection Time: 06/01/15  9:05 PM  Result Value Ref Range Status  Specimen Description BLOOD BLOOD RIGHT FOREARM  Final   Special Requests BOTTLES DRAWN AEROBIC AND ANAEROBIC  Final   Culture   Final    NO GROWTH 3 DAYS Performed at [facility]    Report Status PENDING  Incomplete  Surgical pcr screen     Status: None   Collection Time: 06/02/15  7:31 PM  Result Value Ref Range Status   MRSA, PCR NEGATIVE NEGATIVE Final   Staphylococcus aureus NEGATIVE NEGATIVE Final    Comment:        The Xpert SA Assay (FDA approved for NASAL specimens in patients over 32 years of age), is one component of a comprehensive surveillance program.  Test performance has been validated by [facility] for patients greater than  or equal to 57 year old. It is not intended to diagnose infection nor to guide or monitor treatment.      Studies: No results found.  Scheduled Meds: . cholecalciferol  1,000 Units Oral Daily  . gabapentin  300 mg Oral TID  . insulin aspart  0-9 Units Subcutaneous TID WC  . insulin aspart  8 Units Subcutaneous TID WC  . insulin glargine  25 Units Subcutaneous QHS  . pravastatin  40 mg Oral Daily   Continuous Infusions: . sodium chloride    . lactated ringers 10 mL/hr at 06/03/15 1320    Time spent: 25 minutes  Larnce Schnackenberg L  [facility]  [email or web address], password [facility] 06/05/2015, 12:10 PM  LOS: 4 days

## 2015-06-06 ENCOUNTER — Encounter (HOSPITAL_COMMUNITY): Payer: Self-pay | Admitting: Orthopedic Surgery

## 2015-06-06 DIAGNOSIS — R000 Tachycardia, unspecified: Secondary | ICD-10-CM

## 2015-06-06 DIAGNOSIS — R269 Unspecified abnormalities of gait and mobility: Secondary | ICD-10-CM

## 2015-06-06 DIAGNOSIS — D62 Acute posthemorrhagic anemia: Secondary | ICD-10-CM

## 2015-06-06 DIAGNOSIS — G8918 Other acute postprocedural pain: Secondary | ICD-10-CM

## 2015-06-06 DIAGNOSIS — E1142 Type 2 diabetes mellitus with diabetic polyneuropathy: Secondary | ICD-10-CM

## 2015-06-06 DIAGNOSIS — Z89519 Acquired absence of unspecified leg below knee: Secondary | ICD-10-CM

## 2015-06-06 DIAGNOSIS — E118 Type 2 diabetes mellitus with unspecified complications: Secondary | ICD-10-CM

## 2015-06-06 LAB — GLUCOSE, CAPILLARY
Glucose-Capillary: 194 mg/dL — ABNORMAL HIGH (ref 65–99)
Glucose-Capillary: 208 mg/dL — ABNORMAL HIGH (ref 65–99)
Glucose-Capillary: 213 mg/dL — ABNORMAL HIGH (ref 65–99)
Glucose-Capillary: 204 mg/dL — ABNORMAL HIGH (ref 65–99)

## 2015-06-06 MED ORDER — HEPARIN SODIUM (PORCINE) 5000 UNIT/ML IJ SOLN
5000.0000 [IU] | Freq: Three times a day (TID) | INTRAMUSCULAR | Status: DC
  Administered 2015-06-06 – 2015-06-07 (×3): 5000 [IU] via SUBCUTANEOUS
  Filled 2015-06-06 (×3): qty 1

## 2015-06-06 MED ORDER — INSULIN GLARGINE 100 UNIT/ML ~~LOC~~ SOLN
30.0000 [IU] | Freq: Every day | SUBCUTANEOUS | Status: DC
  Administered 2015-06-06: 30 [IU] via SUBCUTANEOUS
  Filled 2015-06-06 (×3): qty 0.3

## 2015-06-06 MED ORDER — INSULIN ASPART 100 UNIT/ML ~~LOC~~ SOLN
10.0000 [IU] | Freq: Three times a day (TID) | SUBCUTANEOUS | Status: DC
  Administered 2015-06-06 – 2015-06-07 (×3): 10 [IU] via SUBCUTANEOUS

## 2015-06-06 NOTE — Progress Notes (Signed)
Patient ID: Gregory May, male   DOB: [birth year], 54 y.o.   MRN: [id number] Patient complains of some stinging burning pain in the residual limb. Several layers of the Coban were removed to decrease the compression. Patient should be a good candidate for inpatient rehabilitation.

## 2015-06-06 NOTE — Progress Notes (Signed)
Inpatient Diabetes Program Recommendations  AACE/ADA: New Consensus Statement on Inpatient Glycemic Control (2015)  Target Ranges:  Prepandial:   less than 140 mg/dL      Peak postprandial:   less than 180 mg/dL (1-2 hours)      Critically ill patients:  140 - 180 mg/dL  Review of glycemic control  Results for Gregory May, Gregory May (MRN [id number]) as of 06/06/2015 08:19  Ref. Range 06/04/2015 21:08 06/05/2015 06:33 06/05/2015 11:26 06/05/2015 16:29 06/05/2015 21:49  Glucose-Capillary Latest Ref Range: 65-99 mg/dL 528204 (H) 413235 (H) 244228 (H) 211 (H) 260 (H)    Diabetes history: Type 2 diabetes Outpatient Diabetes medications: Toujeo 20 units q HS, Novolin 12-20 units tid with meals Current orders for Inpatient glycemic control:  Novolog sensitive tid with meals, Lantus 25 units q HS, Novolog 8 units tid, Novolog 0-9 units tid with meals  Inpatient Diabetes Program Recommendations:    Consider increasing Lantus to 30 units qhs  (fasting blood sugar elevated) Consider ordering A1C to determine pre-hospitalization glycemic control Consider increasing Novolog mealtime insulin to 10 units tid- continue Novolog correction as ordered.  Susette RacerJulie Lumi Winslett, RN, BA, MHA, CDE Diabetes Coordinator Inpatient Diabetes Program  [phone] (Team Pager) [phone] [facility] Office) 06/06/2015 8:27 AM

## 2015-06-06 NOTE — Progress Notes (Signed)
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 Awaiting rehabilitation evaluation.   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 Lajoyce Cornersuda. Dressing changed by him 10/24. Felt to be a good candidate for inpatient rehabilitation-input pending. - Antibiotics have been discontinued. Pain appropriately controlled.  Uncontrolled Type II DM with peripheral neuropathy -  Lantus had been increased but will further increase to 30 units at bedtime, check A1c and increase NovoLog mealtime to 10 units. Monitor closely - Continue gabapentin  HLD - Continue statins  History of DVT/PE - Completed 6 months of Lovenox anticoagulation  Substance abuse-cocaine - UDS positive for cocaine. Cessation counseling.     DVT prophylaxis: Subcutaneous heparin Code Status: Full Family Communication: None at bedside Disposition Plan: Possibly to CIR when bed available.   Consultants:  Orthopedics  Procedures:  Right transtibial amputation 10/21  Antibiotics:  IV Zosyn 10/19-10/22  IV vancomycin 10/19-10/22   Subjective: Mild appropriate postop right lower extremity pain. Denies any other complaints.  Objective: Filed Vitals:   06/04/15 1944 06/05/15 0512 06/05/15 1421 06/05/15 2100  BP: 127/77 104/70 113/72 132/82  Pulse: 100 88 85 102  Temp: 98.5 F (36.9 C) 98.7 F (37.1 C) 97.8 F (36.6 C) 98.6 F (37 C)  TempSrc: Oral Oral Oral Oral  Resp: 16 18 17 18   Height:      Weight:      SpO2: 95% 98% 99% 95%    Intake/Output Summary (Last 24 hours) at 06/06/15 1607 Last data filed at 06/06/15 1400  Gross per 24 hour  Intake    720 ml  Output   3950 ml  Net  -3230 ml   Filed Weights   06/01/15 2207  Weight: 88.86 kg (195 lb 14.4 oz)     Exam:  General exam: Pleasant young male lying comfortably in bed Respiratory system: Clear. No increased work of breathing. Cardiovascular system: S1 & S2 heard, RRR. No JVD, murmurs, gallops, clicks or pedal edema. Gastrointestinal system: Abdomen is nondistended, soft and nontender. Normal bowel sounds heard. Central nervous system: Alert and oriented. No focal neurological deficits. Extremities: Symmetric 5 x 5 power. Right transtibial amputation site dressing clean and dry   Data Reviewed: Basic Metabolic Panel:  Recent Labs Lab 06/01/15 1149 06/02/15 0250  NA 135 135  K 4.0 4.1  CL 103 102  CO2 25 27  GLUCOSE 215* 227*  BUN 13 10  CREATININE 0.80 0.90  CALCIUM 8.9 8.4*   Liver Function Tests:  Recent Labs Lab 06/01/15 1149  AST 35  ALT 48  ALKPHOS 78  BILITOT 0.4  PROT 7.5  ALBUMIN 3.3*   No results for input(s): LIPASE, AMYLASE in the last 168 hours. No results for input(s): AMMONIA in the last 168 hours. CBC:  Recent Labs Lab 06/01/15 1149 06/02/15 0250  WBC 14.5* 11.6*  NEUTROABS 10.2*  --   HGB 14.0 12.9*  HCT 42.1 40.3  MCV 91.9 93.5  PLT 385 386   Cardiac Enzymes: No results for input(s): CKTOTAL, CKMB, CKMBINDEX, TROPONINI in the last 168 hours. BNP (last 3 results) No results for input(s): PROBNP in the last 8760 hours. CBG:  Recent Labs Lab 06/05/15 1126 06/05/15  1629 06/05/15 2149 06/06/15 0644 06/06/15 1131  GLUCAP 228* 211* 260* 194* 208*    Recent Results (from the past 240 hour(s))  Blood culture (routine x 2)     Status: None (Preliminary result)   Collection Time: 06/01/15  9:04 PM  Result Value Ref Range Status   Specimen Description BLOOD BLOOD LEFT FOREARM  Final   Special Requests BOTTLES DRAWN AEROBIC AND ANAEROBIC  Final   Culture   Final    NO GROWTH 4  DAYS Performed at [facility]    Report Status PENDING  Incomplete  Blood culture (routine x 2)     Status: None (Preliminary result)   Collection Time: 06/01/15  9:05 PM  Result Value Ref Range Status   Specimen Description BLOOD BLOOD RIGHT FOREARM  Final   Special Requests BOTTLES DRAWN AEROBIC AND ANAEROBIC  Final   Culture   Final    NO GROWTH 4 DAYS Performed at [facility]    Report Status PENDING  Incomplete  Surgical pcr screen     Status: None   Collection Time: 06/02/15  7:31 PM  Result Value Ref Range Status   MRSA, PCR NEGATIVE NEGATIVE Final   Staphylococcus aureus NEGATIVE NEGATIVE Final    Comment:        The Xpert SA Assay (FDA approved for NASAL specimens in patients over 2 years of age), is one component of a comprehensive surveillance program.  Test performance has been validated by [facility] for patients greater than or equal to 75 year old. It is not intended to diagnose infection nor to guide or monitor treatment.          Studies: No results found.      Scheduled Meds: . cholecalciferol  1,000 Units Oral Daily  . gabapentin  300 mg Oral TID  . insulin aspart  0-9 Units Subcutaneous TID WC  . insulin aspart  8 Units Subcutaneous TID WC  . insulin glargine  25 Units Subcutaneous QHS  . pravastatin  40 mg Oral Daily   Continuous Infusions: . sodium chloride    . lactated ringers 10 mL/hr at 06/03/15 1320    Principal Problem:   Infection of right foot Active Problems:   Diabetic foot ulcer (HCC)   Pure hypercholesterolemia   Diabetes mellitus type 2, controlled, with complications (HCC)   Pulmonary emboli (HCC)   DVT (deep venous thrombosis) (HCC)   Osteoarthritis, hip, bilateral   DJD (degenerative joint disease) of knee   Diabetic polyneuropathy associated with type 2 diabetes mellitus (HCC)   Osteomyelitis (HCC)   Diabetic foot infection (HCC)    Time spent: 20 minutes    HONGALGI,ANAND, MD,  FACP, FHM. [facility] Pager [phone]  If 7PM-7AM, please contact night-coverage [email or web address] Password [email or web address] 06/06/2015, 4:07 PM    LOS: 5 days

## 2015-06-06 NOTE — Progress Notes (Signed)
Occupational Therapy Treatment Patient Details Name: Gregory May MRN: [id number] DOB: [birth year] Today's Date: 06/06/2015    History of present illness 54 y.o. male with PMH of hyperlipidemia, diabetes mellitus, DVT, PE, arthritis, DJD, who presented with right foot heel ulcer and pain. Pt now s/p Rt BKA.   OT comments  Pt. Making gains with skilled OT.  Able to complete tub transfer with min a.  Still requiring intermittent tactile and verbal guidance for safety and sequencing with functional mobility with use of crutches.  Pt. Is very motivated and receptive to feedback.  Remains an excellent candidate for CIR level therapies for continued intense therapies to focus on increasing safety and independence with ADLS prior to return home.    Follow Up Recommendations  CIR    Equipment Recommendations  None recommended by OT    Recommendations for Other Services Rehab consult    Precautions / Restrictions Precautions Precautions: Fall Restrictions RLE Weight Bearing: Non weight bearing       Mobility Bed Mobility Overal bed mobility: Modified Independent             General bed mobility comments: pt. was sitting in recliner upon arrival into room  Transfers Overall transfer level: Needs assistance Equipment used: Crutches Transfers: Sit to/from [employer]GI CorporationStand;Stand Pivot Transfers Sit to Stand: Min assist Stand pivot transfers: Min assist       General transfer comment: cues for sequence and crutch placement and slow place     Balance                                   ADL Overall ADL's : Needs assistance/impaired         Upper Body Bathing: Minimal assitance;Standing;Cueing for compensatory techniques;Cueing for safety;Cueing for sequencing Upper Body Bathing Details (indicate cue type and reason): intermittent cues and instruction for management of crutches while in standing              Toilet Transfer: Minimal assistance;Cueing for  sequencing;Ambulation Toilet Transfer Details (indicate cue type and reason): amb. to/from b.room with use of crutches, cues and physical assist to stabalize crutches and slow pace.  also has tendancy to place crutches far out of reach during sit/stand Toileting- ArchitectClothing Manipulation and Hygiene: Min guard Toileting - Clothing Manipulation Details (indicate cue type and reason): simulated during multiple sit/stands during session Tub/ Shower Transfer: Tub transfer;Minimal assistance;Cueing for sequencing;Cueing for safety;Anterior/posterior;3 in 1 Tub/Shower Transfer Details (indicate cue type and reason): provided demo and pt. able to return demo of backing up to 3n1 half in/out of tub and sitting sideways.  has hand held at home stating this will work for him.  finances are an issue so would not be able to get a tub bench so he felt this was the best option.  was trying to attempt "hopping" over tub with crutches or sliding around the side of tub.  spoke at length that neither of thes were safe options and offered high fall risks.  he bagan to laugh and said "i know i know i promise i wont try any of my own ideas, ill do it your way".  able to return demo with use of 3n1 in b.room Functional mobility during ADLs: Minimal assistance General ADL Comments: reviewed and emphasized safe tub transfer techniques      Vision  Perception     Praxis      Cognition   Behavior During Therapy: WFL for tasks assessed/performed Overall Cognitive Status: Within Functional Limits for tasks assessed                       Extremity/Trunk Assessment               Exercises Amputee Exercises Hip ABduction/ADduction: AROM;Seated;Both;15 reps Hip Flexion/Marching: AROM;Seated;15 reps;Left Knee Extension: AROM;Seated;Right;Left;10 reps;15 reps (10 reps on right)   Shoulder Instructions       General Comments      Pertinent Vitals/ Pain       Pain Assessment:   (did not rate but did yell out and moan with initial sit/stand.  after deep breathing and re-positioning he stated he felt better) Pain Score: 7  Pain Location: Right residual limb and phantom pain Pain Descriptors / Indicators: Burning Pain Intervention(s): Monitored during session;Repositioned  Home Living                                          Prior Functioning/Environment              Frequency Min 2X/week     Progress Toward Goals  OT Goals(current goals can now be found in the care plan section)  Progress towards OT goals: Progressing toward goals     Plan Discharge plan remains appropriate    Co-evaluation                 End of Session Equipment Utilized During Treatment: Gait belt (crutches)   Activity Tolerance     Patient Left in chair;with call bell/phone within reach   Nurse Communication          Time: 1610-9604 OT Time Calculation (min): 16 min  Charges: OT General Charges $OT Visit: 1 Procedure OT Treatments $Self Care/Home Management : 8-22 mins  Robet Leu, COTA/L 06/06/2015, 3:15 PM

## 2015-06-06 NOTE — Progress Notes (Signed)
Physical Therapy Treatment Patient Details Name: Gregory May MRN: [id number] DOB: [birth year] Today's Date: 06/06/2015    History of Present Illness 54 y.o. male with PMH of hyperlipidemia, diabetes mellitus, DVT, PE, arthritis, DJD, who presented with right foot heel ulcer and pain. Pt now s/p Rt BKA.    PT Comments    Pt very pleasant and eager to return to independent lifestyle. Pt moving well with transfers and gait with limitation of pain in residual limb. Pt educated for massage and stimulation of residual limb as well as HEP, safety with transfers and gait. Pt with excellent progression and highly motivated to work with therapy. Will follow.   Follow Up Recommendations  CIR;Supervision/Assistance - 24 hour     Equipment Recommendations       Recommendations for Other Services       Precautions / Restrictions Precautions Precautions: Fall Restrictions Weight Bearing Restrictions: Yes RLE Weight Bearing: Non weight bearing    Mobility  Bed Mobility Overal bed mobility: Modified Independent                Transfers Overall transfer level: Needs assistance   Transfers: Sit to/from Stand Sit to Stand: Min guard         General transfer comment: cues for sequence and crutch placement  Ambulation/Gait Ambulation/Gait assistance: Min guard Ambulation Distance (Feet): 175 Feet Assistive device: Crutches Gait Pattern/deviations: Step-to pattern   Gait velocity interpretation: Below normal speed for age/gender General Gait Details: cues for safety and position of RLE with gait   Stairs            Wheelchair Mobility    Modified Rankin (Stroke Patients Only)       Balance                                    Cognition Arousal/Alertness: Awake/alert Behavior During Therapy: WFL for tasks assessed/performed Overall Cognitive Status: Within Functional Limits for tasks assessed                      Exercises  Amputee Exercises Hip ABduction/ADduction: AROM;Seated;Both;15 reps Hip Flexion/Marching: AROM;Seated;15 reps;Left Knee Extension: AROM;Seated;Right;Left;10 reps;15 reps (10 reps on right)    General Comments        Pertinent Vitals/Pain Pain Score: 7  Pain Location: Right residual limb and phantom pain Pain Descriptors / Indicators: Burning Pain Intervention(s): Limited activity within patient's tolerance;Monitored during session;Premedicated before session;Repositioned    Home Living                      Prior Function            PT Goals (current goals can now be found in the care plan section) Progress towards PT goals: Progressing toward goals    Frequency       PT Plan Current plan remains appropriate    Co-evaluation             End of Session   Activity Tolerance: Patient tolerated treatment well Patient left: in chair;with call bell/phone within reach;with nursing/sitter in room     Time: 1101-1131 PT Time Calculation (min) (ACUTE ONLY): 30 min  Charges:  $Gait Training: 8-22 mins $Therapeutic Exercise: 8-22 mins                    G Codes:      Delorse Lekabor, Chananya Canizalez Beth 06/06/2015,  11:40 AM Delaney Meigs, PT [phone]

## 2015-06-06 NOTE — Progress Notes (Signed)
I met with pt at bedside to discuss his rehab venue options. Pt prefers an inpt rehab rather than SNF. He is uninsured but has applied for disability and medicaid. I will follow up tomorrow with bed avalialbility. RN CM is aware. [phone]

## 2015-06-06 NOTE — H&P (Signed)
Physical Medicine and Rehabilitation Admission H&P    Chief Complaint  Patient presents with  . Foot Wound   : HPI: Gregory May is a 54 y.o. male with history of diabetes mellitus peripheral neuropathy, multiple joint surgeries, pulmonary embolism/DVT left lower extremity maintained on Eliquis completed February 2016. Patient lives with his brother one level home 4 steps to entry. He will not have 24/7 support at discharge. Independent with crutches prior to admission. Brother works third shift and sleeps during the day. Presented 06/01/2015 through the ED with right foot ulcer swelling as well as pain. He was initially treated at a wound care center with 6 cycles of hyperbaric therapy. WBC 14,500. Urine drug screen positive for cocaine. X-rays right foot suggestive of osteomyelitis. Limb was not felt to be salvageable. 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 Lajoyce Cornersuda. Hospital course pain management. Subcutaneous heparin for DVT prophylaxis. Physical therapy evaluation completed 06/04/2015 with recommendations of physical medicine rehabilitation consult. Patient was admitted for a comprehensive rehab program  ROS Constitutional: Negative for fever and chills.  HENT: Negative for hearing loss.  Eyes: Negative for blurred vision and double vision.  Respiratory: Negative for cough and shortness of breath.  Cardiovascular: Positive for leg swelling. Negative for chest pain and palpitations.  Gastrointestinal: Positive for constipation. Negative for nausea, vomiting and abdominal pain.  Genitourinary: Negative for dysuria and flank pain.  Musculoskeletal: Positive for myalgias and joint pain.  Skin: Negative for rash.  Neurological: Negative for dizziness, seizures, loss of consciousness and headaches.   +Phantom limb pain  All other systems reviewed and are negative   Past Medical History  Diagnosis Date  . Sebaceous cyst     on back of neck  . Diabetes  mellitus     type II  . Pulmonary embolism (HCC)   . Deep vein thrombosis (DVT) (HCC)   . Arthritis     bilateral hips  . 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 DJD (degenerative joint disease)   . Pulmonary emboli (HCC) 02/08/2014    Date of diagnosis February 08 2014, on chest CTA Hospitalized for 3 days Had some symptoms of shortness of breath, and chest pain With intercurrent DVT of the left LE. Duration of anticoagulation: 8 months. End date 10/11/2014.  Anticoagulant: Lovenox 120 units daily Switched to Eliquis on 05/25/2014 per patient preference    Past Surgical History  Procedure Laterality Date  . Rotator cuff repair  2005 (approx)    right   . Closed reduction with humer pin insertion  1974    left hip  . Joint replacement  2006 (approx)    right hip replaced  . 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 Total hip arthroplasty Left 07/21/2014    Procedure: TOTAL HIP ARTHROPLASTY ANTERIOR APPROACH;  Surgeon: Loreta Aveaniel F Murphy, MD;  [facility];  Service: Orthopedics;  Laterality: Left;  . Hardware removal Left 07/21/2014    Procedure: HARDWARE REMOVAL;  Surgeon: Loreta Aveaniel F Murphy, MD;  [facility];  Service: Orthopedics;  Laterality: Left;  . Amputation Right 06/03/2015    Procedure: Right Below Knee Amputation;  Surgeon: Nadara MustardMarcus Duda V, MD;  [facility];  Service: Orthopedics;  Laterality: Right;   Family History  Problem Relation Age of Onset  . Cancer Mother     breast, colon, liver  . Diabetes Father    Social History:  reports that he has never  smoked. He has never used smokeless tobacco. He reports that he drinks alcohol. He reports that he does not use illicit drugs. Allergies: No Known Allergies Medications Prior to Admission  Medication Sig Dispense Refill  . bag balm OINT ointment Apply 1 application topically as needed for dry skin.    . cholecalciferol (VITAMIN D) 1000 UNITS tablet Take 1,000 Units by mouth daily.    . Insulin Glargine  (TOUJEO SOLOSTAR) 300 UNIT/ML SOPN Inject 20 Units into the skin at bedtime.     . Insulin Regular Human (NOVOLIN R RELION IJ) Inject 12-20 Units as directed 3 (three) times daily.     . pravastatin (PRAVACHOL) 40 MG tablet TAKE ONE TABLET BY MOUTH ONCE DAILY 30 tablet 3  . sulfamethoxazole-trimethoprim (BACTRIM DS,SEPTRA DS) 800-160 MG tablet Take 1 tablet by mouth 2 (two) times daily.    . tadalafil (CIALIS) 20 MG tablet Take 1 tablet (20 mg total) by mouth daily as needed for erectile dysfunction. 5 tablet 1  . gabapentin (NEURONTIN) 300 MG capsule Take 1 capsule (300 mg total) by mouth 3 (three) times daily. (Patient not taking: Reported on 06/01/2015) 120 capsule 4    Home: Home Living Family/patient expects to be discharged to:: Unsure [facility] for CIR, if able) Living Arrangements: Other relatives (lives with brother) Available Help at Discharge: Family, Available PRN/intermittently (brother works 3rd shift; sleeps during day) Type of Home: House Home Access: Stairs to enter [employer] of Steps: 4; pt has 3 steps with rail and additional step with no rail Entrance Stairs-Rails: Right Home Layout: One level Bathroom Shower/Tub: Engineer, manufacturing systems: Standard Home Equipment: Merchant navy officer, Bedside commode, Crutches (uses lounge chair in shower) Adaptive Equipment: Reacher, Sock aid Additional Comments: pt reports his house has a lot of clutter   Functional History: Prior Function Level of Independence: Independent with assistive device(s) Comments: uses crutches  Functional Status:  Mobility: Bed Mobility Overal bed mobility: Modified Independent Bed Mobility: Supine to Sit Supine to sit: Supervision Transfers Overall transfer level: Needs assistance Equipment used: Crutches Transfers: Sit to/from Stand Sit to Stand: Min guard General transfer comment: cues for sequence and crutch placement Ambulation/Gait Ambulation/Gait assistance: Min  guard Ambulation Distance (Feet): 175 Feet Assistive device: Crutches Gait Pattern/deviations: Step-to pattern General Gait Details: cues for safety and position of RLE with gait Gait velocity interpretation: Below normal speed for age/gender    ADL: ADL Overall ADL's : Needs assistance/impaired Lower Body Dressing: Sitting/lateral leans, Set up, Supervision/safety Toilet Transfer: Ambulation, Moderate assistance (ambulated-min guard with crutches; sit to stand-Mod assist) Toileting- Clothing Manipulation and Hygiene: Min guard (standing) Functional mobility during ADLs: Min guard (crutches) General ADL Comments: Discussed desensitization techniques for RLE as well as positioning of pillows.   Cognition: Cognition Overall Cognitive Status: Within Functional Limits for tasks assessed Orientation Level: Oriented X4 Cognition Arousal/Alertness: Awake/alert Behavior During Therapy: WFL for tasks assessed/performed Overall Cognitive Status: Within Functional Limits for tasks assessed  Physical Exam: Blood pressure 132/82, pulse 102, temperature 98.6 F (37 C), temperature source Oral, resp. rate 18, height 6' (1.829 m), weight 88.86 kg (195 lb 14.4 oz), SpO2 95 %. Physical Exam Constitutional: He is oriented to person, place, and time. He appears well-developed and well-nourished.  HENT:  Head: Normocephalic and atraumatic.  Poor dentition  Eyes: Conjunctivae and EOM are normal.  Neck: Normal range of motion. Neck supple. No thyromegaly present.  Cardiovascular: Normal rate and regular rhythm.  Respiratory: Effort normal and breath sounds normal. No respiratory distress.  GI: Soft. Bowel sounds are normal. He exhibits no distension.  Musculoskeletal: He exhibits edema (Stump) and tenderness (Stump).  Strength b/l UE 4+/5 grossly thoughout LLE 4+/5 grossly throughout RLE hip flex 5/5, BKA  Neurological: He is alert and oriented to person, place, and time.  Follows full  commands  Skin: Skin is warm and dry.  BKA site is dressed appropriately tender  Psychiatric: He has a normal mood and affect. His behavior is normal   Results for orders placed or performed during the hospital encounter of 06/01/15 (from the past 48 hour(s))  Glucose, capillary     Status: Abnormal   Collection Time: 06/04/15  4:09 PM  Result Value Ref Range   Glucose-Capillary 169 (H) 65 - 99 mg/dL   Comment 1 Notify RN   Glucose, capillary     Status: Abnormal   Collection Time: 06/04/15  9:08 PM  Result Value Ref Range   Glucose-Capillary 204 (H) 65 - 99 mg/dL  Glucose, capillary     Status: Abnormal   Collection Time: 06/05/15  6:33 AM  Result Value Ref Range   Glucose-Capillary 235 (H) 65 - 99 mg/dL  Glucose, capillary     Status: Abnormal   Collection Time: 06/05/15 11:26 AM  Result Value Ref Range   Glucose-Capillary 228 (H) 65 - 99 mg/dL  Glucose, capillary     Status: Abnormal   Collection Time: 06/05/15  4:29 PM  Result Value Ref Range   Glucose-Capillary 211 (H) 65 - 99 mg/dL  Glucose, capillary     Status: Abnormal   Collection Time: 06/05/15  9:49 PM  Result Value Ref Range   Glucose-Capillary 260 (H) 65 - 99 mg/dL  Glucose, capillary     Status: Abnormal   Collection Time: 06/06/15  6:44 AM  Result Value Ref Range   Glucose-Capillary 194 (H) 65 - 99 mg/dL  Glucose, capillary     Status: Abnormal   Collection Time: 06/06/15 11:31 AM  Result Value Ref Range   Glucose-Capillary 208 (H) 65 - 99 mg/dL   No results found.     Medical Problem List and Plan: 1. 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  DVT Prophylaxis/Anticoagulation: Subcutaneous heparin for DVT prophylaxis. Monitor for any bleeding episodes 3. Pain Management: Neurontin 300 mg TID,Oxycodone and robaxin as needed.Monitor with increased mobility 4. Diabetes mellitus with peripheral neuropathy. Latest hemoglobin A1c 8.2. NovoLog 10 units 3 times a  day, Lantus insulin 30 units daily at bedtime. Check blood sugars before meals and at bedtime and provide diabetic teaching 5. Neuropsych: This patient is capable of making decisions on his own behalf. 6. Skin/Wound Care: Routine skin checks. 7. Fluids/Electrolytes/Nutrition: Routine I&O's with follow-up chemistries 8. Hyperlipidemia. Pravachol 9. Polysubstance abuse. Counseling   Post Admission Physician Evaluation: 1. Functional deficits secondary  to ight BKA secondary to abscess ulceration 06/03/2015 2. Patient is admitted to receive collaborative, interdisciplinary care between the physiatrist, rehab nursing staff, and therapy team. 3. Patient's level of medical complexity and substantial therapy needs in context of that medical necessity cannot be provided at a lesser intensity of care such as a SNF. 4. Patient has experienced substantial functional loss from his/her baseline which was documented above under the "Functional History" and "Functional Status" headings.  Judging by the patient's diagnosis, physical exam, and functional history, the patient has potential for functional progress which will result in measurable gains while on inpatient rehab.  These gains will be of substantial and practical  use upon discharge  in facilitating mobility and self-care at the household level. 5. Physiatrist will provide 24 hour management of medical needs as well as oversight of the therapy plan/treatment and provide guidance as appropriate regarding the interaction of the two. 6. 24 hour rehab nursing will assist with safety, skin/wound care, disease management, medication administration, pain management and patient education and help integrate therapy concepts, techniques,education, etc. 7. PT will assess and treat for/with: Lower extremity strength, range of motion, stamina, balance, functional mobility, safety, adaptive techniques and equipment, woundcare, coping skills, pain control, pre-prosthetic  training and education.   Goals are: mod I/Supervision. 8. OT will assess and treat for/with: ADL's, functional mobility, safety, upper extremity strength, adaptive techniques and equipment, wound mgt, ego support, and pre-prosthetic education, community reintegration.   Goals are: Mod I/Supervision. Therapy may not proceed with showering this patient. 9. Case Management and Social Worker will assess and treat for psychological issues and discharge planning. 10. Team conference will be held weekly to assess progress toward goals and to determine barriers to discharge. 11. Patient will receive at least 3 hours of therapy per day at least 5 days per week. 12. ELOS: 10-12 days.       13. Prognosis:  excellent  Maryla Morrow, MD  06/06/2015

## 2015-06-06 NOTE — Consult Note (Addendum)
Physical Medicine and Rehabilitation Consult Reason for Consult: Right transtibial amputation Referring Physician: Dr. Lajoyce Cornersuda   HPI: Gregory May is a 54 y.o. male with history of diabetes mellitus peripheral neuropathy, multiple joint surgeries, pulmonary embolism/DVT left lower extremity maintained on Eliquis completed February 2016. Patient lives with his brother one level home 4 steps to entry. He will not have 24/7 support at discharge. Independent with crutches prior to admission. Brother works third shift and sleeps during the day. Presented 06/01/2015 through the ED with right foot ulcer swelling as well as pain. He was initially treated at a wound care center with 6 cycles of hyperbaric therapy. WBC 14,500. Urine drug screen positive for cocaine. X-rays right foot suggestive of osteomyelitis. Limb was not felt to be salvageable. 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 Lajoyce Cornersuda. Hospital course pain management. Physical therapy evaluation completed 06/04/2015 with recommendations of physical medicine rehabilitation consult.   Review of Systems  Constitutional: Negative for fever and chills.  HENT: Negative for hearing loss.   Eyes: Negative for blurred vision and double vision.  Respiratory: Negative for cough and shortness of breath.   Cardiovascular: Positive for leg swelling. Negative for chest pain and palpitations.  Gastrointestinal: Positive for constipation. Negative for nausea, vomiting and abdominal pain.  Genitourinary: Negative for dysuria and flank pain.  Musculoskeletal: Positive for myalgias and joint pain.  Skin: Negative for rash.  Neurological: Negative for dizziness, seizures, loss of consciousness and headaches.       +Phantom limb pain  All other systems reviewed and are negative.  Past Medical History  Diagnosis Date  . Sebaceous cyst     on back of neck  . Diabetes mellitus     type II  . Pulmonary embolism (HCC)   . Deep vein  thrombosis (DVT) (HCC)   . Arthritis     bilateral hips  . 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 DJD (degenerative joint disease)   . Pulmonary emboli (HCC) 02/08/2014    Date of diagnosis February 08 2014, on chest CTA Hospitalized for 3 days Had some symptoms of shortness of breath, and chest pain With intercurrent DVT of the left LE. Duration of anticoagulation: 8 months. End date 10/11/2014.  Anticoagulant: Lovenox 120 units daily Switched to Eliquis on 05/25/2014 per patient preference    Past Surgical History  Procedure Laterality Date  . Rotator cuff repair  2005 (approx)    right   . Closed reduction with humer pin insertion  1974    left hip  . Joint replacement  2006 (approx)    right hip replaced  . 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 Total hip arthroplasty Left 07/21/2014    Procedure: TOTAL HIP ARTHROPLASTY ANTERIOR APPROACH;  Surgeon: Loreta Aveaniel F Murphy, MD;  [facility];  Service: Orthopedics;  Laterality: Left;  . Hardware removal Left 07/21/2014    Procedure: HARDWARE REMOVAL;  Surgeon: Loreta Aveaniel F Murphy, MD;  [facility];  Service: Orthopedics;  Laterality: Left;   Family History  Problem Relation Age of Onset  . Cancer Mother     breast, colon, liver  . Diabetes Father    Social History:  reports that he has never smoked. He has never used smokeless tobacco. He reports that he drinks alcohol. He reports that he does not use illicit drugs. Allergies: No Known Allergies Medications Prior to Admission  Medication Sig Dispense Refill  .  bag balm OINT ointment Apply 1 application topically as needed for dry skin.    . cholecalciferol (VITAMIN D) 1000 UNITS tablet Take 1,000 Units by mouth daily.    . Insulin Glargine (TOUJEO SOLOSTAR) 300 UNIT/ML SOPN Inject 20 Units into the skin at bedtime.     . Insulin Regular Human (NOVOLIN R RELION IJ) Inject 12-20 Units as directed 3 (three) times daily.     . pravastatin (PRAVACHOL) 40 MG tablet TAKE  ONE TABLET BY MOUTH ONCE DAILY 30 tablet 3  . sulfamethoxazole-trimethoprim (BACTRIM DS,SEPTRA DS) 800-160 MG tablet Take 1 tablet by mouth 2 (two) times daily.    . tadalafil (CIALIS) 20 MG tablet Take 1 tablet (20 mg total) by mouth daily as needed for erectile dysfunction. 5 tablet 1  . gabapentin (NEURONTIN) 300 MG capsule Take 1 capsule (300 mg total) by mouth 3 (three) times daily. (Patient not taking: Reported on 06/01/2015) 120 capsule 4    Home: Home Living Family/patient expects to be discharged to:: Unsure [facility] for CIR, if able) Living Arrangements: Other relatives (lives with brother) Available Help at Discharge: Family, Available PRN/intermittently (brother works 3rd shift; sleeps during day) Type of Home: House Home Access: Stairs to enter [employer] of Steps: 4; pt has 3 steps with rail and additional step with no rail Entrance Stairs-Rails: Right Home Layout: One level Bathroom Shower/Tub: Engineer, manufacturing systems: Standard Home Equipment: Merchant navy officer, Bedside commode, Crutches (uses lounge chair in shower) Adaptive Equipment: Reacher, Sock aid Additional Comments: pt reports his house has a lot of clutter  Functional History: Prior Function Level of Independence: Independent with assistive device(s) Comments: uses crutches Functional Status:  Mobility: Bed Mobility Overal bed mobility: Modified Independent Bed Mobility: Supine to Sit Supine to sit: Supervision Transfers Overall transfer level: Needs assistance Equipment used: Crutches Transfers: Sit to/from Stand Sit to Stand: Min guard General transfer comment: cues for sequence and crutch placement Ambulation/Gait Ambulation/Gait assistance: Min guard Ambulation Distance (Feet): 175 Feet Assistive device: Crutches Gait Pattern/deviations: Step-to pattern General Gait Details: cues for safety and position of RLE with gait Gait velocity interpretation: Below normal speed for  age/gender    ADL: ADL Overall ADL's : Needs assistance/impaired Lower Body Dressing: Sitting/lateral leans, Set up, Supervision/safety Toilet Transfer: Ambulation, Moderate assistance (ambulated-min guard with crutches; sit to stand-Mod assist) Toileting- Clothing Manipulation and Hygiene: Min guard (standing) Functional mobility during ADLs: Min guard (crutches) General ADL Comments: Discussed desensitization techniques for RLE as well as positioning of pillows.   Cognition: Cognition Overall Cognitive Status: Within Functional Limits for tasks assessed Orientation Level: Oriented X4 Cognition Arousal/Alertness: Awake/alert Behavior During Therapy: WFL for tasks assessed/performed Overall Cognitive Status: Within Functional Limits for tasks assessed  Blood pressure 132/82, pulse 102, temperature 98.6 F (37 C), temperature source Oral, resp. rate 18, height 6' (1.829 m), weight 88.86 kg (195 lb 14.4 oz), SpO2 95 %. Physical Exam  Vitals reviewed. Constitutional: He is oriented to person, place, and time. He appears well-developed and well-nourished.  HENT:  Head: Normocephalic and atraumatic.  Poor dentition  Eyes: Conjunctivae and EOM are normal.  Neck: Normal range of motion. Neck supple. No thyromegaly present.  Cardiovascular: Normal rate and regular rhythm.   Respiratory: Effort normal and breath sounds normal. No respiratory distress.  GI: Soft. Bowel sounds are normal. He exhibits no distension.  Musculoskeletal: He exhibits edema (Stump) and tenderness (Stump).  Strength b/l UE 4+/5 grossly thoughout LLE 4+/5 grossly throughout RLE hip flex 5/5, BKA  Neurological:  He is alert and oriented to person, place, and time.  Follows full commands  Skin: Skin is warm and dry.  BKA site is dressed appropriately tender  Psychiatric: He has a normal mood and affect. His behavior is normal.    Results for orders placed or performed during the hospital encounter of 06/01/15  (from the past 24 hour(s))  Glucose, capillary     Status: Abnormal   Collection Time: 06/05/15  4:29 PM  Result Value Ref Range   Glucose-Capillary 211 (H) 65 - 99 mg/dL  Glucose, capillary     Status: Abnormal   Collection Time: 06/05/15  9:49 PM  Result Value Ref Range   Glucose-Capillary 260 (H) 65 - 99 mg/dL  Glucose, capillary     Status: Abnormal   Collection Time: 06/06/15  6:44 AM  Result Value Ref Range   Glucose-Capillary 194 (H) 65 - 99 mg/dL  Glucose, capillary     Status: Abnormal   Collection Time: 06/06/15 11:31 AM  Result Value Ref Range   Glucose-Capillary 208 (H) 65 - 99 mg/dL   No results found.  Assessment/Plan: Diagnosis: Right BKA Labs and images independently reviewed.  Records reviewed and summated above. PT/OT for mobility, ADL's, strengthening,and wheelchair training Clean amputation daily with soap and water Monitor incision site for signs of infection or impending skin breakdown. Staples to remain in place for 3-4 weeks Stump shrinker, for edema control  Scar mobilization massaging to prevent soft tissue adherence Stump protector during therapies Prevent flexion contractures by implementing the following:   Encourage prone lying for 20-30 mins per day BID to avoid hip flexion contractures if medically appropriate;  Avoid pillow under knees when patient is lying in bed in order to prevent both knee and hip flexion contractures;  Avoid prolonged sitting Post surgical pain control with oral medication Phantom limb pain control with physical modalities including desensitization techniques (gentle self massage to the residual stump,hot packs if sensation iintact, [place]) and mirror therapy, TENS. If ineffective, consider pharmacological treatment for neuropathic pain (e.g gabapentin, pregabalin, amytriptalyine, duloxetine).  When using wheelchair, patient should have knee on amputated side fully extended with board under the seat cushion. Avoid injury to  contralateral side  1. Does the need for close, 24 hr/day medical supervision in concert with the patient's rehab needs make it unreasonable for this patient to be served in a less intensive setting? Yes 2. Co-Morbidities requiring supervision/potential complications: Vitamin D deficiency (Cont supplementation), Tachycardia (monitor in accordance with pain and increasing activity), peripheral neuropathy, post-op pain (transition to oral meds when appropriate), DM (Monitor in accordance with exercise and adjust meds as necessary), leukocytosis (cont to monitor for signs/symptoms of infections), ABLA (transfuse if necessary to ensure appropriate perfusion for increased activity tolerance) 3. Due to safety, skin/wound care, disease management, medication administration, pain management and patient education, does the patient require 24 hr/day rehab nursing? Yes 4. Does the patient require coordinated care of a physician, rehab nurse, PT (1.5-2 hrs/day, 5 days/week) and OT (1.5-2 hrs/day, 5 days/week) to address physical and functional deficits in the context of the above medical diagnosis(es)? Yes Addressing deficits in the following areas: balance, endurance, locomotion, strength, transferring, bathing, dressing, toileting and psychosocial support 5. Can the patient actively participate in an intensive therapy program of at least 3 hrs of therapy per day at least 5 days per week? Yes 6. The potential for patient to make measurable gains while on inpatient rehab is excellent 7. Anticipated functional outcomes upon discharge from  inpatient rehab are modified independent and supervision  with PT, modified independent and supervision with OT, n/a with SLP. 8. Estimated rehab length of stay to reach the above functional goals is: 10-12 days. 9. Does the patient have adequate social supports and living environment to accommodate these discharge functional goals? Potentially 10. Anticipated D/C setting:  Home 11. Anticipated post D/C treatments: HH therapy and Home excercise program 12. Overall Rehab/Functional Prognosis: excellent  RECOMMENDATIONS: This patient's condition is appropriate for continued rehabilitative care in the following setting: CIR, if appropriate support at discharge Patient has agreed to participate in recommended program. Yes Note that insurance prior authorization may be required for reimbursement for recommended care.  Comment: Rehab Admissions Coordinator to follow up.  Maryla Morrow, MD 06/06/2015

## 2015-06-07 ENCOUNTER — Encounter (HOSPITAL_COMMUNITY): Payer: Self-pay | Admitting: *Deleted

## 2015-06-07 ENCOUNTER — Inpatient Hospital Stay (HOSPITAL_COMMUNITY)
Admission: RE | Admit: 2015-06-07 | Discharge: 2015-06-14 | DRG: 561 | Disposition: A | Discharge status: Home / Self Care | Payer: Medicaid Other | Source: Intra-hospital | Attending: Physical Medicine & Rehabilitation | Admitting: Physical Medicine & Rehabilitation

## 2015-06-07 DIAGNOSIS — Z792 Long term (current) use of antibiotics: Secondary | ICD-10-CM

## 2015-06-07 DIAGNOSIS — E785 Hyperlipidemia, unspecified: Secondary | ICD-10-CM | POA: Diagnosis not present

## 2015-06-07 DIAGNOSIS — Z79899 Other long term (current) drug therapy: Secondary | ICD-10-CM

## 2015-06-07 DIAGNOSIS — E1140 Type 2 diabetes mellitus with diabetic neuropathy, unspecified: Secondary | ICD-10-CM

## 2015-06-07 DIAGNOSIS — Z794 Long term (current) use of insulin: Secondary | ICD-10-CM

## 2015-06-07 DIAGNOSIS — R52 Pain, unspecified: Secondary | ICD-10-CM

## 2015-06-07 DIAGNOSIS — S8001XS Contusion of right knee, sequela: Secondary | ICD-10-CM | POA: Diagnosis not present

## 2015-06-07 DIAGNOSIS — E1142 Type 2 diabetes mellitus with diabetic polyneuropathy: Secondary | ICD-10-CM

## 2015-06-07 DIAGNOSIS — Z89511 Acquired absence of right leg below knee: Secondary | ICD-10-CM

## 2015-06-07 DIAGNOSIS — M25561 Pain in right knee: Secondary | ICD-10-CM

## 2015-06-07 DIAGNOSIS — Z96642 Presence of left artificial hip joint: Secondary | ICD-10-CM | POA: Diagnosis present

## 2015-06-07 DIAGNOSIS — F1910 Other psychoactive substance abuse, uncomplicated: Secondary | ICD-10-CM | POA: Diagnosis not present

## 2015-06-07 DIAGNOSIS — Z86718 Personal history of other venous thrombosis and embolism: Secondary | ICD-10-CM

## 2015-06-07 DIAGNOSIS — S78111A Complete traumatic amputation at level between right hip and knee, initial encounter: Secondary | ICD-10-CM

## 2015-06-07 DIAGNOSIS — M86671 Other chronic osteomyelitis, right ankle and foot: Secondary | ICD-10-CM

## 2015-06-07 DIAGNOSIS — E118 Type 2 diabetes mellitus with unspecified complications: Secondary | ICD-10-CM | POA: Diagnosis present

## 2015-06-07 DIAGNOSIS — S8001XD Contusion of right knee, subsequent encounter: Secondary | ICD-10-CM | POA: Diagnosis not present

## 2015-06-07 DIAGNOSIS — Z4781 Encounter for orthopedic aftercare following surgical amputation: Secondary | ICD-10-CM | POA: Diagnosis present

## 2015-06-07 DIAGNOSIS — E1165 Type 2 diabetes mellitus with hyperglycemia: Secondary | ICD-10-CM

## 2015-06-07 DIAGNOSIS — Z86711 Personal history of pulmonary embolism: Secondary | ICD-10-CM

## 2015-06-07 LAB — COMPREHENSIVE METABOLIC PANEL
ALT: 50 U/L (ref 17–63)
AST: 36 U/L (ref 15–41)
Albumin: 2.6 g/dL — ABNORMAL LOW (ref 3.5–5.0)
Alkaline Phosphatase: 86 U/L (ref 38–126)
Anion gap: 10 (ref 5–15)
BUN: 17 mg/dL (ref 6–20)
CO2: 28 mmol/L (ref 22–32)
Calcium: 9.3 mg/dL (ref 8.9–10.3)
Chloride: 96 mmol/L — ABNORMAL LOW (ref 101–111)
Creatinine, Ser: 0.86 mg/dL (ref 0.61–1.24)
GFR calc Af Amer: >60 mL/min (ref >60)
GFR calc non Af Amer: >60 mL/min (ref >60)
Glucose, Bld: 280 mg/dL — ABNORMAL HIGH (ref 65–99)
Potassium: 4.4 mmol/L (ref 3.5–5.1)
Sodium: 134 mmol/L — ABNORMAL LOW (ref 135–145)
Total Bilirubin: 0.6 mg/dL (ref 0.3–1.2)
Total Protein: 7.7 g/dL (ref 6.5–8.1)

## 2015-06-07 LAB — CBC WITH DIFFERENTIAL/PLATELET
Basophils Absolute: 0.1 10*3/uL (ref 0.0–0.1)
Basophils Relative: 0 %
Eosinophils Absolute: 0.2 10*3/uL (ref 0.0–0.7)
Eosinophils Relative: 2 %
HCT: 41.7 % (ref 39.0–52.0)
Hemoglobin: 13.9 g/dL (ref 13.0–17.0)
Lymphocytes Relative: 19 %
Lymphs Abs: 2.5 10*3/uL (ref 0.7–4.0)
MCH: 30.4 pg (ref 26.0–34.0)
MCHC: 33.3 g/dL (ref 30.0–36.0)
MCV: 91.2 fL (ref 78.0–100.0)
Monocytes Absolute: 1.5 10*3/uL — ABNORMAL HIGH (ref 0.1–1.0)
Monocytes Relative: 11 %
Neutro Abs: 8.9 10*3/uL — ABNORMAL HIGH (ref 1.7–7.7)
Neutrophils Relative %: 68 %
Platelets: 435 10*3/uL — ABNORMAL HIGH (ref 150–400)
RBC: 4.57 MIL/uL (ref 4.22–5.81)
RDW: 13.6 % (ref 11.5–15.5)
WBC: 13.2 10*3/uL — ABNORMAL HIGH (ref 4.0–10.5)

## 2015-06-07 LAB — CBC
HCT: 41.3 % (ref 39.0–52.0)
Hemoglobin: 13.2 g/dL (ref 13.0–17.0)
MCH: 29.3 pg (ref 26.0–34.0)
MCHC: 32 g/dL (ref 30.0–36.0)
MCV: 91.6 fL (ref 78.0–100.0)
Platelets: 409 10*3/uL — ABNORMAL HIGH (ref 150–400)
RBC: 4.51 MIL/uL (ref 4.22–5.81)
RDW: 13.7 % (ref 11.5–15.5)
WBC: 12.2 10*3/uL — ABNORMAL HIGH (ref 4.0–10.5)

## 2015-06-07 LAB — CULTURE, BLOOD (ROUTINE X 2)
Culture: NO GROWTH
Culture: NO GROWTH

## 2015-06-07 LAB — GLUCOSE, CAPILLARY
Glucose-Capillary: 204 mg/dL — ABNORMAL HIGH (ref 65–99)
Glucose-Capillary: 200 mg/dL — ABNORMAL HIGH (ref 65–99)
Glucose-Capillary: 245 mg/dL — ABNORMAL HIGH (ref 65–99)

## 2015-06-07 MED ORDER — GABAPENTIN 300 MG PO CAPS
300.0000 mg | ORAL_CAPSULE | Freq: Three times a day (TID) | ORAL | Status: DC
  Administered 2015-06-07 – 2015-06-14 (×22): 300 mg via ORAL
  Filled 2015-06-07 (×22): qty 1

## 2015-06-07 MED ORDER — METHOCARBAMOL 500 MG PO TABS: 500.0000 mg | ORAL_TABLET | Freq: Four times a day (QID) | ORAL | Status: DC | PRN

## 2015-06-07 MED ORDER — INSULIN ASPART 100 UNIT/ML ~~LOC~~ SOLN: 0.0000 [IU] | Freq: Three times a day (TID) | SUBCUTANEOUS | Status: DC

## 2015-06-07 MED ORDER — INSULIN GLARGINE 100 UNIT/ML ~~LOC~~ SOLN
30.0000 [IU] | Freq: Every day | SUBCUTANEOUS | Status: DC
  Administered 2015-06-07: 30 [IU] via SUBCUTANEOUS
  Filled 2015-06-07 (×2): qty 0.3

## 2015-06-07 MED ORDER — INSULIN ASPART 100 UNIT/ML ~~LOC~~ SOLN
10.0000 [IU] | Freq: Three times a day (TID) | SUBCUTANEOUS | Status: DC
  Administered 2015-06-07 – 2015-06-08 (×2): 10 [IU] via SUBCUTANEOUS

## 2015-06-07 MED ORDER — ACETAMINOPHEN 325 MG PO TABS: 650.0000 mg | ORAL_TABLET | Freq: Four times a day (QID) | ORAL | Status: DC | PRN

## 2015-06-07 MED ORDER — INSULIN ASPART 100 UNIT/ML ~~LOC~~ SOLN
0.0000 [IU] | Freq: Three times a day (TID) | SUBCUTANEOUS | Status: DC
  Administered 2015-06-07: 5 [IU] via SUBCUTANEOUS
  Administered 2015-06-08 (×2): 3 [IU] via SUBCUTANEOUS
  Administered 2015-06-09: 5 [IU] via SUBCUTANEOUS
  Administered 2015-06-09: 3 [IU] via SUBCUTANEOUS
  Administered 2015-06-10: 1 [IU] via SUBCUTANEOUS
  Administered 2015-06-10 (×2): 2 [IU] via SUBCUTANEOUS
  Administered 2015-06-11: 1 [IU] via SUBCUTANEOUS
  Administered 2015-06-11: 2 [IU] via SUBCUTANEOUS
  Administered 2015-06-11 – 2015-06-12 (×2): 1 [IU] via SUBCUTANEOUS
  Administered 2015-06-12: 2 [IU] via SUBCUTANEOUS
  Administered 2015-06-13 – 2015-06-14 (×4): 1 [IU] via SUBCUTANEOUS
  Administered 2015-06-14 (×2): 2 [IU] via SUBCUTANEOUS

## 2015-06-07 MED ORDER — VITAMIN D 1000 UNITS PO TABS
1000.0000 [IU] | ORAL_TABLET | Freq: Every day | ORAL | Status: DC
  Administered 2015-06-08 – 2015-06-14 (×7): 1000 [IU] via ORAL
  Filled 2015-06-07 (×7): qty 1

## 2015-06-07 MED ORDER — BAG BALM OINTMENT: 1.0000 "application " | TOPICAL_OINTMENT | CUTANEOUS | Status: DC | PRN

## 2015-06-07 MED ORDER — ACETAMINOPHEN 650 MG RE SUPP: 650.0000 mg | Freq: Four times a day (QID) | RECTAL | Status: DC | PRN

## 2015-06-07 MED ORDER — INSULIN GLARGINE 300 UNIT/ML ~~LOC~~ SOPN
35.0000 [IU] | PEN_INJECTOR | Freq: Every day | SUBCUTANEOUS | Status: DC
Start: 2015-06-07 — End: 2015-12-13

## 2015-06-07 MED ORDER — OXYCODONE HCL 5 MG PO TABS
5.0000 mg | ORAL_TABLET | ORAL | Status: DC | PRN
  Administered 2015-06-07 – 2015-06-14 (×43): 10 mg via ORAL
  Filled 2015-06-07 (×43): qty 2

## 2015-06-07 MED ORDER — ONDANSETRON HCL 4 MG/2ML IJ SOLN: 4.0000 mg | Freq: Four times a day (QID) | INTRAMUSCULAR | Status: DC | PRN

## 2015-06-07 MED ORDER — METHOCARBAMOL 500 MG PO TABS
500.0000 mg | ORAL_TABLET | Freq: Four times a day (QID) | ORAL | Status: DC | PRN
  Administered 2015-06-07 – 2015-06-14 (×17): 500 mg via ORAL
  Filled 2015-06-07 (×17): qty 1

## 2015-06-07 MED ORDER — PRAVASTATIN SODIUM 20 MG PO TABS
40.0000 mg | ORAL_TABLET | Freq: Every day | ORAL | Status: DC
  Administered 2015-06-08 – 2015-06-14 (×7): 40 mg via ORAL
  Filled 2015-06-07 (×7): qty 2

## 2015-06-07 MED ORDER — SORBITOL 70 % SOLN
30.0000 mL | Freq: Every day | Status: DC | PRN
  Administered 2015-06-10: 30 mL via ORAL
  Filled 2015-06-07: qty 30

## 2015-06-07 MED ORDER — HEPARIN SODIUM (PORCINE) 5000 UNIT/ML IJ SOLN
5000.0000 [IU] | Freq: Three times a day (TID) | INTRAMUSCULAR | Status: DC
  Administered 2015-06-07 – 2015-06-14 (×20): 5000 [IU] via SUBCUTANEOUS
  Filled 2015-06-07 (×21): qty 1

## 2015-06-07 MED ORDER — INSULIN ASPART 100 UNIT/ML ~~LOC~~ SOLN: 10.0000 [IU] | Freq: Three times a day (TID) | SUBCUTANEOUS | Status: DC

## 2015-06-07 MED ORDER — ONDANSETRON HCL 4 MG PO TABS: 4.0000 mg | ORAL_TABLET | Freq: Four times a day (QID) | ORAL | Status: DC | PRN

## 2015-06-07 MED ORDER — OXYCODONE HCL 5 MG PO TABS: 5.0000 mg | ORAL_TABLET | ORAL | Status: DC | PRN

## 2015-06-07 NOTE — Progress Notes (Signed)
Physical Medicine and Rehabilitation Consult Reason for Consult: Right transtibial amputation Referring Physician: Dr. Lajoyce Corners   HPI: Gregory May is a 54 y.o. male with history of diabetes mellitus peripheral neuropathy, multiple joint surgeries, pulmonary embolism/DVT left lower extremity maintained on Eliquis completed February 2016. Patient lives with his brother one level home 4 steps to entry. He will not have 24/7 support at discharge. Independent with crutches prior to admission. Brother works third shift and sleeps during the day. Presented 06/01/2015 through the ED with right foot ulcer swelling as well as pain. He was initially treated at a wound care center with 6 cycles of hyperbaric therapy. WBC 14,500. Urine drug screen positive for cocaine. X-rays right foot suggestive of osteomyelitis. Limb was not felt to be salvageable. 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 Lajoyce Corners. Hospital course pain management. Physical therapy evaluation completed 06/04/2015 with recommendations of physical medicine rehabilitation consult.   Review of Systems  Constitutional: Negative for fever and chills.  HENT: Negative for hearing loss.  Eyes: Negative for blurred vision and double vision.  Respiratory: Negative for cough and shortness of breath.  Cardiovascular: Positive for leg swelling. Negative for chest pain and palpitations.  Gastrointestinal: Positive for constipation. Negative for nausea, vomiting and abdominal pain.  Genitourinary: Negative for dysuria and flank pain.  Musculoskeletal: Positive for myalgias and joint pain.  Skin: Negative for rash.  Neurological: Negative for dizziness, seizures, loss of consciousness and headaches.   +Phantom limb pain  All other systems reviewed and are negative.  Past Medical History  Diagnosis Date  . Sebaceous cyst     on back of neck  . Diabetes mellitus     type II  . Pulmonary embolism (HCC)   .  Deep vein thrombosis (DVT) (HCC)   . Arthritis     bilateral hips  . 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 DJD (degenerative joint disease)   . Pulmonary emboli (HCC) 02/08/2014    Date of diagnosis February 08 2014, on chest CTA Hospitalized for 3 days Had some symptoms of shortness of breath, and chest pain With intercurrent DVT of the left LE. Duration of anticoagulation: 8 months. End date 10/11/2014. Anticoagulant: Lovenox 120 units daily Switched to Eliquis on 05/25/2014 per patient preference    Past Surgical History  Procedure Laterality Date  . Rotator cuff repair  2005 (approx)    right   . Closed reduction with humer pin insertion  1974    left hip  . Joint replacement  2006 (approx)    right hip replaced  . 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 Total hip arthroplasty Left 07/21/2014    Procedure: TOTAL HIP ARTHROPLASTY ANTERIOR APPROACH; Surgeon: Loreta Ave, MD; Location[facility] OR; Service: Orthopedics; Laterality: Left;  . Hardware removal Left 07/21/2014    Procedure: HARDWARE REMOVAL; Surgeon: Loreta Ave, MD; Location: [facility] OR; Service: Orthopedics; Laterality: Left;   Family History  Problem Relation Age of Onset  . Cancer Mother     breast, colon, liver  . Diabetes Father    Social History:  reports that he has never smoked. He has never used smokeless tobacco. He reports that he drinks alcohol. He reports that he does not use illicit drugs. Allergies: No Known Allergies Medications Prior to Admission  Medication Sig Dispense Refill  . bag balm OINT ointment Apply 1 application topically as needed for dry skin.    . cholecalciferol (  VITAMIN D) 1000 UNITS tablet Take 1,000 Units by mouth daily.    . Insulin Glargine (TOUJEO SOLOSTAR) 300 UNIT/ML SOPN Inject 20 Units into the skin at bedtime.     . Insulin Regular Human (NOVOLIN R  RELION IJ) Inject 12-20 Units as directed 3 (three) times daily.     . pravastatin (PRAVACHOL) 40 MG tablet TAKE ONE TABLET BY MOUTH ONCE DAILY 30 tablet 3  . sulfamethoxazole-trimethoprim (BACTRIM DS,SEPTRA DS) 800-160 MG tablet Take 1 tablet by mouth 2 (two) times daily.    . tadalafil (CIALIS) 20 MG tablet Take 1 tablet (20 mg total) by mouth daily as needed for erectile dysfunction. 5 tablet 1  . gabapentin (NEURONTIN) 300 MG capsule Take 1 capsule (300 mg total) by mouth 3 (three) times daily. (Patient not taking: Reported on 06/01/2015) 120 capsule 4    Home: Home Living Family/patient expects to be discharged to:: Unsure [facility] for CIR, if able) Living Arrangements: Other relatives (lives with brother) Available Help at Discharge: Family, Available PRN/intermittently (brother works 3rd shift; sleeps during day) Type of Home: House Home Access: Stairs to enter [employer] of Steps: 4; pt has 3 steps with rail and additional step with no rail Entrance Stairs-Rails: Right Home Layout: One level Bathroom Shower/Tub: Engineer, manufacturing systems: Standard Home Equipment: Merchant navy officer, Bedside commode, Crutches (uses lounge chair in shower) Adaptive Equipment: Reacher, Sock aid Additional Comments: pt reports his house has a lot of clutter  Functional History: Prior Function Level of Independence: Independent with assistive device(s) Comments: uses crutches Functional Status:  Mobility: Bed Mobility Overal bed mobility: Modified Independent Bed Mobility: Supine to Sit Supine to sit: Supervision Transfers Overall transfer level: Needs assistance Equipment used: Crutches Transfers: Sit to/from Stand Sit to Stand: Min guard General transfer comment: cues for sequence and crutch placement Ambulation/Gait Ambulation/Gait assistance: Min guard Ambulation Distance (Feet): 175 Feet Assistive device: Crutches Gait Pattern/deviations:  Step-to pattern General Gait Details: cues for safety and position of RLE with gait Gait velocity interpretation: Below normal speed for age/gender    ADL: ADL Overall ADL's : Needs assistance/impaired Lower Body Dressing: Sitting/lateral leans, Set up, Supervision/safety Toilet Transfer: Ambulation, Moderate assistance (ambulated-min guard with crutches; sit to stand-Mod assist) Toileting- Clothing Manipulation and Hygiene: Min guard (standing) Functional mobility during ADLs: Min guard (crutches) General ADL Comments: Discussed desensitization techniques for RLE as well as positioning of pillows.   Cognition: Cognition Overall Cognitive Status: Within Functional Limits for tasks assessed Orientation Level: Oriented X4 Cognition Arousal/Alertness: Awake/alert Behavior During Therapy: WFL for tasks assessed/performed Overall Cognitive Status: Within Functional Limits for tasks assessed  Blood pressure 132/82, pulse 102, temperature 98.6 F (37 C), temperature source Oral, resp. rate 18, height 6' (1.829 m), weight 88.86 kg (195 lb 14.4 oz), SpO2 95 %. Physical Exam  Vitals reviewed. Constitutional: He is oriented to person, place, and time. He appears well-developed and well-nourished.  HENT:  Head: Normocephalic and atraumatic.  Poor dentition  Eyes: Conjunctivae and EOM are normal.  Neck: Normal range of motion. Neck supple. No thyromegaly present.  Cardiovascular: Normal rate and regular rhythm.  Respiratory: Effort normal and breath sounds normal. No respiratory distress.  GI: Soft. Bowel sounds are normal. He exhibits no distension.  Musculoskeletal: He exhibits edema (Stump) and tenderness (Stump).  Strength b/l UE 4+/5 grossly thoughout LLE 4+/5 grossly throughout RLE hip flex 5/5, BKA  Neurological: He is alert and oriented to person, place, and time.  Follows full commands  Skin: Skin is warm  and dry.  BKA site is dressed appropriately tender  Psychiatric: He  has a normal mood and affect. His behavior is normal.     Lab Results Last 24 Hours    Results for orders placed or performed during the hospital encounter of 06/01/15 (from the past 24 hour(s))  Glucose, capillary Status: Abnormal   Collection Time: 06/05/15 4:29 PM  Result Value Ref Range   Glucose-Capillary 211 (H) 65 - 99 mg/dL  Glucose, capillary Status: Abnormal   Collection Time: 06/05/15 9:49 PM  Result Value Ref Range   Glucose-Capillary 260 (H) 65 - 99 mg/dL  Glucose, capillary Status: Abnormal   Collection Time: 06/06/15 6:44 AM  Result Value Ref Range   Glucose-Capillary 194 (H) 65 - 99 mg/dL  Glucose, capillary Status: Abnormal   Collection Time: 06/06/15 11:31 AM  Result Value Ref Range   Glucose-Capillary 208 (H) 65 - 99 mg/dL      Imaging Results (Last 48 hours)    No results found.    Assessment/Plan: Diagnosis: Right BKA Labs and images independently reviewed. Records reviewed and summated above. PT/OT for mobility, ADL's, strengthening,and wheelchair training Clean amputation daily with soap and water Monitor incision site for signs of infection or impending skin breakdown. Staples to remain in place for 3-4 weeks Stump shrinker, for edema control  Scar mobilization massaging to prevent soft tissue adherence Stump protector during therapies Prevent flexion contractures by implementing the following:  Encourage prone lying for 20-30 mins per day BID to avoid hip flexion contractures if medically appropriate; Avoid pillow under knees when patient is lying in bed in order to prevent both knee and hip flexion contractures; Avoid prolonged sitting Post surgical pain control with oral medication Phantom limb pain control with physical modalities including desensitization techniques (gentle self massage to the residual stump,hot packs if sensation iintact, US) and  mirror therapy, TENS. If ineffective, consider pharmacological treatment for neuropathic pain (e.g gabapentin, pregabalin, amytriptalyine, duloxetine).  When using wheelchair, patient should have knee on amputated side fully extended with board under the seat cushion. Avoid injury to contralateral side  1. Does the need for close, 24 hr/day medical supervision in concert with the patient's rehab needs make it unreasonable for this patient to be served in a less intensive setting? Yes 2. Co-Morbidities requiring supervision/potential complications: Vitamin D deficiency (Cont supplementation), Tachycardia (monitor in accordance with pain and increasing activity), peripheral neuropathy, post-op pain (transition to oral meds when appropriate), DM (Monitor in accordance with exercise and adjust meds as necessary), leukocytosis (cont to monitor for signs/symptoms of infections), ABLA (transfuse if necessary to ensure appropriate perfusion for increased activity tolerance) 3. Due to safety, skin/wound care, disease management, medication administration, pain management and patient education, does the patient require 24 hr/day rehab nursing? Yes 4. Does the patient require coordinated care of a physician, rehab nurse, PT (1.5-2 hrs/day, 5 days/week) and OT (1.5-2 hrs/day, 5 days/week) to address physical and functional deficits in the context of the above medical diagnosis(es)? Yes Addressing deficits in the following areas: balance, endurance, locomotion, strength, transferring, bathing, dressing, toileting and psychosocial support 5. Can the patient actively participate in an intensive therapy program of at least 3 hrs of therapy per day at least 5 days per week? Yes 6. The potential for patient to make measurable gains while on inpatient rehab is excellent 7. Anticipated functional outcomes upon discharge from inpatient rehab are modified independent and supervision with PT, modified independent and  supervision with OT, n/a with SLP. 8.  Estimated rehab length of stay to reach the above functional goals is: 10-12 days. 9. Does the patient have adequate social supports and living environment to accommodate these discharge functional goals? Potentially 10. Anticipated D/C setting: Home 11. Anticipated post D/C treatments: HH therapy and Home excercise program 12. Overall Rehab/Functional Prognosis: excellent  RECOMMENDATIONS: This patient's condition is appropriate for continued rehabilitative care in the following setting: CIR, if appropriate support at discharge Patient has agreed to participate in recommended program. Yes Note that insurance prior authorization may be required for reimbursement for recommended care.  Comment: Rehab Admissions Coordinator to follow up.  Maryla Morrow, MD 06/06/2015       Revision History     Date/Time User Provider Type Action   06/06/2015 1:00 PM Ankit Karis Juba, MD Physician Addend   06/06/2015 12:14 PM Ankit Karis Juba, MD Physician Sign   06/06/2015 6:30 AM Charlton Amor, PA-C Physician Assistant Pend   View Details Report       Routing History     Date/Time From To Method   06/06/2015 1:00 PM Ankit Karis Juba, MD Ankit Karis Juba, MD In Basket   06/06/2015 1:00 PM Ankit Karis Juba, MD Deneise Lever, MD In Basket

## 2015-06-07 NOTE — Progress Notes (Signed)
Pt arrived to unit aprox. 1520 from 5N. Oriented to room and rehab routine. Safety plan discussed and patient verbalized understanding. Call bell within reach. No further questions at this time. Gregory May, Phill MutterMelissa Rebecca

## 2015-06-07 NOTE — Progress Notes (Signed)
I have an inpt rehab bed to admit pt to today and he is in agreement. I have contacted Dr. Waymon AmatoHongalgi for d/c orders. RN CM is aware. I will make the arrangements to admit pt to today. [phone]

## 2015-06-07 NOTE — Progress Notes (Signed)
PMR Admission Coordinator Pre-Admission Assessment  Patient: Gregory May is an 54 y.o., male MRN: [id number] DOB: 1960/09/15 Height: 6' (182.9 cm) Weight: 88.86 kg (195 lb 14.4 oz)  Insurance Information HMO: PPO: PCP: IPA: 80/20: OTHER:  PRIMARY: uninsured  Medicaid Application Date: Case Manager:  Disability Application Date: Case Worker:  Pt has applied for disability and Medicaid 8/16 prior to BKA. Receives [employer]. Was receiving unempl;oyment which has now stopped  Emergency Contact Information Contact Information    Name Relation Home Work Mobile   [place] Aunt [phone]     No name specified         Current Medical History  Patient Admitting Diagnosis: Right BKA  History of Present Illness: 54 yo male with hisotry of DM , peripheral neuropathy, multiple joint surgeries, PE/ DVT LE maintained on Eliquis completed 2/16. Presented 06/01/15 with right foot ulcer swelling as well as pain. Initially treated at a wound care center with 6 cycles of hyperbaric therapy. Urine drug screen positive for cocaine. Xrays suggestive of osteo. UNderwent R BKA 10/21 per Dr. Lajoyce Corners.   Past Medical History  Past Medical History  Diagnosis Date  . Sebaceous cyst     on back of neck  . Diabetes mellitus     type II  . Pulmonary embolism (HCC)   . Deep vein thrombosis (DVT) (HCC)   . Arthritis     bilateral hips  . 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 DJD (degenerative joint disease)   . Pulmonary emboli (HCC) 02/08/2014    Date of diagnosis February 08 2014, on chest CTA Hospitalized for 3 days Had some symptoms of shortness of breath, and chest pain With intercurrent DVT of the left LE. Duration of anticoagulation: 8  months. End date 10/11/2014. Anticoagulant: Lovenox 120 units daily Switched to Eliquis on 05/25/2014 per patient preference     Family History  family history includes Cancer in his mother; Diabetes in his father.  Prior Rehab/Hospitalizations:  Has the patient had major surgery during 100 days prior to admission? No  Current Medications   Current facility-administered medications:  . acetaminophen (TYLENOL) tablet 650 mg, 650 mg, Oral, Q6H PRN **OR** [DISCONTINUED] acetaminophen (TYLENOL) suppository 650 mg, 650 mg, Rectal, Q6H PRN, Lorretta Harp, MD . bag balm ointment 1 g, 1 application, Topical, PRN, Lorretta Harp, MD . cholecalciferol (VITAMIN D) tablet 1,000 Units, 1,000 Units, Oral, Daily, Lorretta Harp, MD, 1,000 Units at 06/06/15 0935 . gabapentin (NEURONTIN) capsule 300 mg, 300 mg, Oral, TID, Lorretta Harp, MD, 300 mg at 06/06/15 2144 . heparin injection 5,000 Units, 5,000 Units, Subcutaneous, 3 times per day, Elease Etienne, MD, 5,000 Units at 06/07/15 0544 . HYDROmorphone (DILAUDID) injection 1 mg, 1 mg, Intravenous, Q2H PRN, Aldean Baker V, MD, 1 mg at 06/07/15 0808 . insulin aspart (novoLOG) injection 0-9 Units, 0-9 Units, Subcutaneous, TID WC, Lorretta Harp, MD, 3 Units at 06/07/15 [phone] . insulin aspart (novoLOG) injection 10 Units, 10 Units, Subcutaneous, TID WC, Elease Etienne, MD, 10 Units at 06/07/15 [phone] . insulin glargine (LANTUS) injection 30 Units, 30 Units, Subcutaneous, QHS, Elease Etienne, MD, 30 Units at 06/06/15 2337 . methocarbamol (ROBAXIN) tablet 500 mg, 500 mg, Oral, Q6H PRN, 500 mg at 06/07/15 0544 **OR** methocarbamol (ROBAXIN) 500 mg in dextrose 5 % 50 mL IVPB, 500 mg, Intravenous, Q6H PRN, Aldean Baker V, MD . ondansetron (ZOFRAN) tablet 4 mg, 4 mg, Oral, Q6H PRN **OR** [DISCONTINUED] ondansetron (ZOFRAN) injection  4 mg, 4 mg, Intravenous, Q6H PRN, Aldean Baker V, MD . oxyCODONE (Oxy IR/ROXICODONE) immediate release tablet 5-10 mg, 5-10 mg, Oral, Q3H PRN,  Aldean Baker V, MD, 10 mg at 06/07/15 0544 . pravastatin (PRAVACHOL) tablet 40 mg, 40 mg, Oral, Daily, Lorretta Harp, MD, 40 mg at 06/06/15 0935  Patients Current Diet: Diet Carb Modified Fluid consistency:: Thin; Room service appropriate?: Yes  Precautions / Restrictions Precautions Precautions: Fall Restrictions Weight Bearing Restrictions: Yes RLE Weight Bearing: Non weight bearing   Has the patient had 2 or more falls or a fall with injury in the past year?No  Prior Activity Level Community (5-7x/wk): driving self and cooking. Has been using crutches pta  Home Assistive Devices / Equipment Home Assistive Devices/Equipment: CBG Meter, Eyeglasses, Crutches Home Equipment: Adaptive equipment, Bedside commode, Crutches (uses lounge chair in shower)  Prior Device Use: Indicate devices/aids used by the patient prior to current illness, exacerbation or injury? crutches  Prior Functional Level Prior Function Level of Independence: (used crutches pta due to clutter in home) Comments: uses crutches  Self Care: Did the patient need help bathing, dressing, using the toilet or eating? Independent  Indoor Mobility: Did the patient need assistance with walking from room to room (with or without device)? Independent  Stairs: Did the patient need assistance with internal or external stairs (with or without device)? Independent  Functional Cognition: Did the patient need help planning regular tasks such as shopping or remembering to take medications? Independent  Current Functional Level Cognition  Overall Cognitive Status: Within Functional Limits for tasks assessed Orientation Level: Oriented X4   Extremity Assessment (includes Sensation/Coordination)  Upper Extremity Assessment: Overall WFL for tasks assessed  Lower Extremity Assessment: RLE deficits/detail, Overall WFL for tasks assessed RLE Deficits / Details: R BKA    ADLs  Overall ADL's : Needs  assistance/impaired Upper Body Bathing: Minimal assitance, Standing, Cueing for compensatory techniques, Cueing for safety, Cueing for sequencing Upper Body Bathing Details (indicate cue type and reason): intermittent cues and instruction for management of crutches while in standing  Lower Body Dressing: Sitting/lateral leans, Set up, Supervision/safety Toilet Transfer: Minimal assistance, Cueing for sequencing, Ambulation Toilet Transfer Details (indicate cue type and reason): amb. to/from b.room with use of crutches, cues and physical assist to stabalize crutches and slow pace. also has tendancy to place crutches far out of reach during sit/stand Toileting- Architect and Hygiene: Min guard Toileting - Clothing Manipulation Details (indicate cue type and reason): simulated during multiple sit/stands during session Tub/ Shower Transfer: Tub transfer, Minimal assistance, Cueing for sequencing, Cueing for safety, Anterior/posterior, 3 in 1 Tub/Shower Transfer Details (indicate cue type and reason): provided demo and pt. able to return demo of backing up to 3n1 half in/out of tub and sitting sideways. has hand held at home stating this will work for him. finances are an issue so would not be able to get a tub bench so he felt this was the best option. was trying to attempt "hopping" over tub with crutches or sliding around the side of tub. spoke at length that neither of thes were safe options and offered high fall risks. he bagan to laugh and said "i know i know i promise i wont try any of my own ideas, ill do it your way". able to return demo with use of 3n1 in b.room Functional mobility during ADLs: Minimal assistance General ADL Comments: reviewed and emphasized safe tub transfer techniques    Mobility  Overal bed mobility: Modified Independent Bed  Mobility: Supine to Sit Supine to sit: Supervision General bed mobility comments: pt. was sitting in recliner upon arrival into  room    Transfers  Overall transfer level: Needs assistance Equipment used: Crutches Transfers: Sit to/from Stand, Stand Pivot Transfers Sit to Stand: Min assist Stand pivot transfers: Min assist General transfer comment: cues for sequence and crutch placement and slow place     Ambulation / Gait / Stairs / Wheelchair Mobility  Ambulation/Gait Ambulation/Gait assistance: Hydrographic surveyor (Feet): 175 Feet Assistive device: Crutches Gait Pattern/deviations: Step-to pattern General Gait Details: cues for safety and position of RLE with gait Gait velocity interpretation: Below normal speed for age/gender    Posture / Balance Balance Overall balance assessment: Needs assistance Sitting balance-Leahy Scale: Good Standing balance support: Single extremity supported, Bilateral upper extremity supported Standing balance-Leahy Scale: Poor Standing balance comment: Good use of crutches.    Special needs/care consideration Skin surgical incision Bowel mgmt: continent Bladder mgmt: continent Diabetic mgmt yes   Previous Home Environment Living Arrangements: Other (Comment) (brother lives with pt in his home he rents) Lives With: Family Available Help at Discharge: Family, Available 24 hours/day (Aunt intermittent daily. Brother works nights so sleeps days) Type of Home: House Home Layout: One level Home Access: Stairs to enter Entrance Stairs-Rails: Right Entrance Stairs-Number of Steps: 4; pt has 3 steps with rail and additional step with no rail Bathroom Shower/Tub: Tub/shower unit, Buyer, retail: No Home Care Services: No Additional Comments: pt reports his house has a lot of clutter  Discharge Living Setting Plans for Discharge Living Setting: Patient's home, Lives with (comment), Other (Comment) (brother lives with pt) Type of Home at Discharge: House Discharge Home Layout: One level Discharge Home Access:  Stairs to enter Entrance Stairs-Rails: Right Entrance Stairs-Number of Steps: 3 steps and then 1 step into house Discharge Bathroom Shower/Tub: Tub/shower unit, Curtain Discharge Bathroom Toilet: Standard Discharge Bathroom Accessibility: No Does the patient have any problems obtaining your medications?: (uninsured)  Social/Family/Support Systems Patient Roles: (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 She has been helping with his dressing changes daily Anticipated Caregiver's Contact Information: see above Ability/Limitations of Caregiver: Celine Ahr works. Brother works third shift who lives with him Caregiver Availability: Intermittent Discharge Plan Discussed with Primary Caregiver: Yes Is Caregiver In Agreement with Plan?: Yes Does Caregiver/Family have Issues with Lodging/Transportation while Pt is in Rehab?: No   Goals/Additional Needs Patient/Family Goal for Rehab: Mod I with PT and OT Expected length of stay: ELOS 5- 7 days Pt/Family Agrees to Admission and willing to participate: Yes Program Orientation Provided & Reviewed with Pt/Caregiver Including Roles & Responsibilities: Yes   Decrease burden of Care through IP rehab admission: n/a  Possible need for SNF placement upon discharge:no  Patient Condition: This patient's condition remains as documented in the consult dated 06/06/2015, in which the Rehabilitation Physician determined and documented that the patient's condition is appropriate for intensive rehabilitative care in an inpatient rehabilitation facility. Will admit to inpatient rehab today.  Preadmission Screen Completed By: Clois Dupes, 06/07/2015 10:20 AM ______________________________________________________________________  Discussed status with Dr. Allena Katz on 06/07/15 at 1020 and received telephone approval for admission today.  Admission Coordinator: Clois Dupes, time 1020 Date 06/07/2015.          Cosigned by: Ankit Karis Juba, MD at 06/07/2015 10:30 AM  Revision History     Date/Time User Provider Type Action  06/07/2015 10:30 AM Ankit Karis JubaAnil Patel, MD Physician Cosign   06/07/2015 10:20 AM Standley BrookingBarbara G Niels Cranshaw, RN Rehab Admission Coordinator Sign

## 2015-06-07 NOTE — Progress Notes (Signed)
Physical Therapy Treatment Patient Details Name: Gregory May MRN: [id number] DOB: 17-May-1961 Today's Date: 06/07/2015    History of Present Illness 54 y.o. male with PMH of hyperlipidemia, diabetes mellitus, DVT, PE, arthritis, DJD, who presented with right foot heel ulcer and pain. Pt now s/p Rt BKA.    PT Comments    Pt was seen for a visit to work on crutch gait again, had a moment of concern at his door when R crutch slipped a little in hand sanitizer.  He had some increased pain with the anxiety and adrenaline, sat and then completed the walk.  His plan is to go to CIR and is an excellent candidate.  Will need to potentially get better tips on his crutches as they are worn and less able to catch such as the liquid at the door.  Follow Up Recommendations  CIR;Supervision/Assistance - 24 hour     Equipment Recommendations  None recommended by PT    Recommendations for Other Services Rehab consult     Precautions / Restrictions Precautions Precautions: Fall Restrictions Weight Bearing Restrictions: Yes RLE Weight Bearing: Non weight bearing    Mobility  Bed Mobility Overal bed mobility: Modified Independent Bed Mobility: Supine to Sit              Transfers Overall transfer level: Needs assistance   Transfers: Sit to/from Stand;Stand Pivot Transfers Sit to Stand: Min guard Stand pivot transfers: Min guard       General transfer comment: sequence, safety hand placement  Ambulation/Gait Ambulation/Gait assistance: Min guard (had to stop at door as pt had slip on one crutch at sanitize) Ambulation Distance (Feet): 175 Feet Assistive device: Crutches   Gait velocity: reduced Gait velocity interpretation: Below normal speed for age/gender General Gait Details: heel to toe through on L foot with pt using longer reach initially but PT cautioned to shorten the length.  Pt had a moment of instability with one crutch slipping a bit in hand sanitizer at door,  sat with some anxiety and increased pain temporarily from the adrenaline.  Talked with him and he completed the walk.     Stairs            Wheelchair Mobility    Modified Rankin (Stroke Patients Only)       Balance Overall balance assessment: Needs assistance Sitting-balance support: Single extremity supported Sitting balance-Leahy Scale: Good     Standing balance support: Bilateral upper extremity supported Standing balance-Leahy Scale: Poor Standing balance comment: due to his single leg stance and some pre-existing issues with PVD                    Cognition Arousal/Alertness: Awake/alert Behavior During Therapy: WFL for tasks assessed/performed Overall Cognitive Status: Within Functional Limits for tasks assessed                      Exercises Amputee Exercises Quad Sets: AROM;Right;10 reps Hip Flexion/Marching: AROM;Right;10 reps Knee Flexion: AROM;Right;10 reps    General Comments General comments (skin integrity, edema, etc.): has wrapped RLE      Pertinent Vitals/Pain Pain Assessment: 0-10 Pain Score: 6  Pain Location: R leg Pain Descriptors / Indicators: Operative site guarding;Aching Pain Intervention(s): Premedicated before session;Repositioned    Home Living                      Prior Function            PT Goals (  current goals can now be found in the care plan section) Acute Rehab PT Goals Patient Stated Goal: feel better Progress towards PT goals: Progressing toward goals    Frequency  Min 3X/week    PT Plan Current plan remains appropriate    Co-evaluation             End of Session   Activity Tolerance: Patient tolerated treatment well;Patient limited by pain Patient left: in chair;with call bell/phone within reach     Time: 0840-0915 PT Time Calculation (min) (ACUTE ONLY): 35 min  Charges:  $Gait Training: 8-22 mins $Therapeutic Exercise: 8-22 mins                    G Codes:       Ivar DrapeStout, Issiac Jamar E 06/07/2015, 2:33 PM   Samul Dadauth Unity Luepke, PT MS Acute Rehab Dept. Number: [facility] [phone] and [facility] [phone]

## 2015-06-07 NOTE — Progress Notes (Signed)
Patient ID: Gregory May, male   DOB: 1961/04/29, 54 y.o.   MRN: [id number] Patient now has complaints of phantom pain. He also complains of swelling he states that the stinging burning pain is resolving. Hopefully patient can be admitted to inpatient rehabilitation.

## 2015-06-07 NOTE — PMR Pre-admission (Signed)
PMR Admission Coordinator Pre-Admission Assessment  Patient: Gregory May is an 54 y.o., male MRN: [id number] DOB: 13-Dec-1960 Height: 6' (182.9 cm) Weight: 88.86 kg (195 lb 14.4 oz)              Insurance Information HMO:     PPO:      PCP:      IPA:      80/20:      OTHER:  PRIMARY: uninsured     Medicaid Application Date:       Case Manager:  Disability Application Date:       Case Worker:  Pt has applied for disability and Medicaid 8/16 prior to BKA. Receives [employer] stamps. Was receiving unempl;oyment which has now stopped  Emergency Contact Information Contact Information    Name Relation Home Work Mobile   ItascaMitchell,Mary Ann Aunt [phone]     No name specified         Current Medical History  Patient Admitting Diagnosis:  Right BKA  History of Present Illness: 54 yo male with hisotry of DM , peripheral neuropathy, multiple joint surgeries, PE/ DVT LE maintained on Eliquis completed 2/16. Presented 06/01/15 with right foot ulcer swelling as well as pain. Initially treated at a wound care center with 6 cycles of hyperbaric therapy. Urine drug screen positive for cocaine. Xrays suggestive of osteo. UNderwent R BKA 10/21 per Dr. Lajoyce Cornersuda.   Past Medical History  Past Medical History  Diagnosis Date  . Sebaceous cyst     on back of neck  . Diabetes mellitus     type II  . Pulmonary embolism (HCC)   . Deep vein thrombosis (DVT) (HCC)   . Arthritis     bilateral hips  . 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 DJD (degenerative joint disease)   . Pulmonary emboli (HCC) 02/08/2014    Date of diagnosis February 08 2014, on chest CTA Hospitalized for 3 days Had some symptoms of shortness of breath, and chest pain With intercurrent DVT of the left LE. Duration of anticoagulation: 8 months. End date 10/11/2014.  Anticoagulant: Lovenox 120 units daily Switched to Eliquis on 05/25/2014 per patient preference     Family History  family history includes Cancer in his mother;  Diabetes in his father.  Prior Rehab/Hospitalizations:  Has the patient had major surgery during 100 days prior to admission? No  Current Medications   Current facility-administered medications:  .  acetaminophen (TYLENOL) tablet 650 mg, 650 mg, Oral, Q6H PRN **OR** [DISCONTINUED] acetaminophen (TYLENOL) suppository 650 mg, 650 mg, Rectal, Q6H PRN, Lorretta HarpXilin Niu, MD .  bag balm ointment 1 g, 1 application, Topical, PRN, Lorretta HarpXilin Niu, MD .  cholecalciferol (VITAMIN D) tablet 1,000 Units, 1,000 Units, Oral, Daily, Lorretta HarpXilin Niu, MD, 1,000 Units at 06/06/15 0935 .  gabapentin (NEURONTIN) capsule 300 mg, 300 mg, Oral, TID, Lorretta HarpXilin Niu, MD, 300 mg at 06/06/15 2144 .  heparin injection 5,000 Units, 5,000 Units, Subcutaneous, 3 times per day, Elease EtienneAnand D Hongalgi, MD, 5,000 Units at 06/07/15 0544 .  HYDROmorphone (DILAUDID) injection 1 mg, 1 mg, Intravenous, Q2H PRN, [name] V, MD, 1 mg at 06/07/15 0808 .  insulin aspart (novoLOG) injection 0-9 Units, 0-9 Units, Subcutaneous, TID WC, Lorretta HarpXilin Niu, MD, 3 Units at 06/07/15 [phone] .  insulin aspart (novoLOG) injection 10 Units, 10 Units, Subcutaneous, TID WC, Elease EtienneAnand D Hongalgi, MD, 10 Units at 06/07/15 [phone] .  insulin glargine (LANTUS) injection 30 Units, 30 Units, Subcutaneous, QHS,  Elease Etienne, MD, 30 Units at 06/06/15 2337 .  methocarbamol (ROBAXIN) tablet 500 mg, 500 mg, Oral, Q6H PRN, 500 mg at 06/07/15 0544 **OR** methocarbamol (ROBAXIN) 500 mg in dextrose 5 % 50 mL IVPB, 500 mg, Intravenous, Q6H PRN, Aldean Baker V, MD .  ondansetron (ZOFRAN) tablet 4 mg, 4 mg, Oral, Q6H PRN **OR** [DISCONTINUED] ondansetron (ZOFRAN) injection 4 mg, 4 mg, Intravenous, Q6H PRN, Aldean Baker V, MD .  oxyCODONE (Oxy IR/ROXICODONE) immediate release tablet 5-10 mg, 5-10 mg, Oral, Q3H PRN, Aldean Baker V, MD, 10 mg at 06/07/15 0544 .  pravastatin (PRAVACHOL) tablet 40 mg, 40 mg, Oral, Daily, Lorretta Harp, MD, 40 mg at 06/06/15 0935  Patients Current Diet: Diet Carb Modified Fluid  consistency:: Thin; Room service appropriate?: Yes  Precautions / Restrictions Precautions Precautions: Fall Restrictions Weight Bearing Restrictions: Yes RLE Weight Bearing: Non weight bearing   Has the patient had 2 or more falls or a fall with injury in the past year?No  Prior Activity Level Community (5-7x/wk): driving self and cooking. Has been using crutches pta  Home Assistive Devices / Equipment Home Assistive Devices/Equipment: CBG Meter, Eyeglasses, Crutches Home Equipment: Adaptive equipment, Bedside commode, Crutches (uses lounge chair in shower)  Prior Device Use: Indicate devices/aids used by the patient prior to current illness, exacerbation or injury? crutches  Prior Functional Level Prior Function Level of Independence:  (used crutches pta due to clutter in home) Comments: uses crutches  Self Care: Did the patient need help bathing, dressing, using the toilet or eating?  Independent  Indoor Mobility: Did the patient need assistance with walking from room to room (with or without device)? Independent  Stairs: Did the patient need assistance with internal or external stairs (with or without device)? Independent  Functional Cognition: Did the patient need help planning regular tasks such as shopping or remembering to take medications? Independent  Current Functional Level Cognition  Overall Cognitive Status: Within Functional Limits for tasks assessed Orientation Level: Oriented X4    Extremity Assessment (includes Sensation/Coordination)  Upper Extremity Assessment: Overall WFL for tasks assessed  Lower Extremity Assessment: RLE deficits/detail, Overall WFL for tasks assessed RLE Deficits / Details: R BKA    ADLs  Overall ADL's : Needs assistance/impaired Upper Body Bathing: Minimal assitance, Standing, Cueing for compensatory techniques, Cueing for safety, Cueing for sequencing Upper Body Bathing Details (indicate cue type and reason): intermittent cues  and instruction for management of crutches while in standing  Lower Body Dressing: Sitting/lateral leans, Set up, Supervision/safety Toilet Transfer: Minimal assistance, Cueing for sequencing, Ambulation Toilet Transfer Details (indicate cue type and reason): amb. to/from b.room with use of crutches, cues and physical assist to stabalize crutches and slow pace.  also has tendancy to place crutches far out of reach during sit/stand Toileting- Architect and Hygiene: Min guard Toileting - Clothing Manipulation Details (indicate cue type and reason): simulated during multiple sit/stands during session Tub/ Shower Transfer: Tub transfer, Minimal assistance, Cueing for sequencing, Cueing for safety, Anterior/posterior, 3 in 1 Tub/Shower Transfer Details (indicate cue type and reason): provided demo and pt. able to return demo of backing up to 3n1 half in/out of tub and sitting sideways.  has hand held at home stating this will work for him.  finances are an issue so would not be able to get a tub bench so he felt this was the best option.  was trying to attempt "hopping" over tub with crutches or sliding around the side of tub.  spoke at length  that neither of thes were safe options and offered high fall risks.  he bagan to laugh and said "i know i know i promise i wont try any of my own ideas, ill do it your way".  able to return demo with use of 3n1 in b.room Functional mobility during ADLs: Minimal assistance General ADL Comments: reviewed and emphasized safe tub transfer techniques    Mobility  Overal bed mobility: Modified Independent Bed Mobility: Supine to Sit Supine to sit: Supervision General bed mobility comments: pt. was sitting in recliner upon arrival into room    Transfers  Overall transfer level: Needs assistance Equipment used: Crutches Transfers: Sit to/from Stand, Stand Pivot Transfers Sit to Stand: Min assist Stand pivot transfers: Min assist General transfer comment:  cues for sequence and crutch placement and slow place     Ambulation / Gait / Stairs / Wheelchair Mobility  Ambulation/Gait Ambulation/Gait assistance: Hydrographic surveyor (Feet): 175 Feet Assistive device: Crutches Gait Pattern/deviations: Step-to pattern General Gait Details: cues for safety and position of RLE with gait Gait velocity interpretation: Below normal speed for age/gender    Posture / Balance Balance Overall balance assessment: Needs assistance Sitting balance-Leahy Scale: Good Standing balance support: Single extremity supported, Bilateral upper extremity supported Standing balance-Leahy Scale: Poor Standing balance comment: Good use of crutches.    Special needs/care consideration Skin surgical incision Bowel mgmt: continent Bladder mgmt: continent Diabetic mgmt yes   Previous Home Environment Living Arrangements: Other (Comment) (brother lives with pt in his home he rents)  Lives With: Family Available Help at Discharge: Family, Available 24 hours/day (Aunt intermittent daily. Brother works nights so sleeps days) Type of Home: House Home Layout: One level Home Access: Stairs to enter Entrance Stairs-Rails: Right Entrance Stairs-Number of Steps: 4; pt has 3 steps with rail and additional step with no rail Bathroom Shower/Tub: Tub/shower unit, Buyer, retail: No Home Care Services: No Additional Comments: pt reports his house has a lot of clutter  Discharge Living Setting Plans for Discharge Living Setting: Patient's home, Lives with (comment), Other (Comment) (brother lives with pt) Type of Home at Discharge: House Discharge Home Layout: One level Discharge Home Access: Stairs to enter Entrance Stairs-Rails: Right Entrance Stairs-Number of Steps: 3 steps and then 1 step into house Discharge Bathroom Shower/Tub: Tub/shower unit, Curtain Discharge Bathroom Toilet: Standard Discharge Bathroom  Accessibility: No Does the patient have any problems obtaining your medications?:  (uninsured)  Social/Family/Support Systems Patient Roles:  (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 She has been helping with his dressing changes daily Anticipated Caregiver's Contact Information: see above Ability/Limitations of Caregiver: Celine Ahr works. Brother works third shift who lives with him Caregiver Availability: Intermittent Discharge Plan Discussed with Primary Caregiver: Yes Is Caregiver In Agreement with Plan?: Yes Does Caregiver/Family have Issues with Lodging/Transportation while Pt is in Rehab?: No   Goals/Additional Needs Patient/Family Goal for Rehab: Mod I with PT and OT Expected length of stay: ELOS 5- 7 days Pt/Family Agrees to Admission and willing to participate: Yes Program Orientation Provided & Reviewed with Pt/Caregiver Including Roles  & Responsibilities: Yes   Decrease burden of Care through IP rehab admission: n/a  Possible need for SNF placement upon discharge:no  Patient Condition: This patient's condition remains as documented in the consult dated 06/06/2015, in which the Rehabilitation Physician determined and documented that the patient's condition is appropriate for intensive rehabilitative care in an inpatient rehabilitation  facility. Will admit to inpatient rehab today.  Preadmission Screen Completed By:  Clois Dupes, 06/07/2015 10:20 AM ______________________________________________________________________   Discussed status with Dr. Allena Katz on 06/07/15 at  1020 and received telephone approval for admission today.  Admission Coordinator:  Clois Dupes, time 1020 Date 06/07/2015.

## 2015-06-07 NOTE — Discharge Summary (Signed)
Physician Discharge Summary  Hoy MornVergil T Bouley ZOX:[id number] DOB: 1961/05/27 DOA: 06/01/2015  PCP: [name], MD  Admit date: 06/01/2015 Discharge date: 06/07/2015  Time spent: Greater than 30 minutes  Recommendations for Outpatient Follow-up:  1. Dr. Deneise LeverParth Saraiya, PCP upon discharge from CIR 2. Dr. [name], orthopedics: Rehabilitation M.D. to consult regarding wound care/dressing instructions, activity upgrade and follow-up. 3. Adjust diabetic medications as needed. 4. Upon discharge from CIR, patient will need prescriptions for pain medications, gabapentin and any newly started or adjusted medications. 5. Please follow final blood cultures results that were sent from the hospital.  Discharge Diagnoses:  Principal Problem:   Infection of right foot Active Problems:   Diabetic foot ulcer (HCC)   Pure hypercholesterolemia   Diabetes mellitus type 2, controlled, with complications (HCC)   Pulmonary emboli (HCC)   DVT (deep venous thrombosis) (HCC)   Osteoarthritis, hip, bilateral   DJD (degenerative joint disease) of knee   Diabetic polyneuropathy associated with type 2 diabetes mellitus (HCC)   Osteomyelitis (HCC)   Diabetic foot infection (HCC)   Discharge Condition: Improved & Stable  Diet recommendation: Heart healthy and diabetic diet.  Filed Weights   06/01/15 2207  Weight: 88.86 kg (195 lb 14.4 oz)    History of present illness:  54 year old male with history of HLD, DM 2 with peripheral neuropathy, DVT/ PE-completed 6 months of LMWH presented with osteomyelitis, abscess and ulceration of right heel and is status post transtibial amputation on 10/21.  Hospital Course:   Chronic osteomyelitis right calcaneus with cellulitis - Status post trans-tibial amputation 10/21 by Dr. Lajoyce Cornersuda. Dressing changed by him 10/24. Felt to be a good candidate for inpatient rehabilitation. - Antibiotics have been discontinued. Pain appropriately controlled. - Nonweight  bearing on right lower extremity. Dressing instructions, activity upgrade recommendations as per Dr. Lajoyce Cornersuda.  Uncontrolled Type II DM with peripheral neuropathy - Patient's CBGs continue to be elevated in the low 200s. Glargine/Tojeo will be further increased to 35 units at bedtime, continue NovoLog 10 units 3 times a day with meals and NovoLog SSI. Further adjustments can be made at CIR based on CBGs. Monitor closely. - Patient apparently was not taking gabapentin PTA but started in the hospital and can be continued for his peripheral neuropathy including phantom pain in right lower extremity.  HLD - Continue statins  History of DVT/PE - Completed 6 months of Lovenox anticoagulation. Early mobilization.  Substance abuse-cocaine - UDS positive for cocaine. Cessation counseling.     Consultants:  Orthopedics  Rehabilitation M.D.  Procedures:  Right transtibial amputation 10/21  Antibiotics:  IV Zosyn 10/19-10/22  IV vancomycin 10/19-10/22  Discharge Exam:  Complaints: Patient states that he still feels as though his right ankle is swollen and he twisted it-consistent with phantom pain. Also indicates that while he was ambulating using crutches with therapy, his crutches slipped and he almost fell and right lower extremity surgical site pain has slightly increased - informed Dr. Lajoyce Cornersuda.  Filed Vitals:   06/05/15 2100 06/06/15 1700 06/06/15 2233 06/07/15 0600  BP: 132/82 131/86 131/86 118/74  Pulse: 102 99 77 88  Temp: 98.6 F (37 C) 98 F (36.7 C) 99.1 F (37.3 C) 98.9 F (37.2 C)  TempSrc: Oral  Oral Oral  Resp: 18 18 18 19   Height:      Weight:      SpO2: 95% 97% 95% 97%    General exam: Pleasant young male lying comfortably in bed Respiratory system: Clear. No increased work of breathing.  Cardiovascular system: S1 & S2 heard, RRR. No JVD, murmurs, gallops, clicks or pedal edema. Gastrointestinal system: Abdomen is nondistended, soft and nontender. Normal bowel  sounds heard. Central nervous system: Alert and oriented. No focal neurological deficits. Extremities: Symmetric 5 x 5 power. Right transtibial amputation site dressing clean and dry  Discharge Instructions      Discharge Instructions    Call MD for:  difficulty breathing, headache or visual disturbances    Complete by:  As directed      Call MD for:  extreme fatigue    Complete by:  As directed      Call MD for:  hives    Complete by:  As directed      Call MD for:  persistant dizziness or light-headedness    Complete by:  As directed      Call MD for:  persistant nausea and vomiting    Complete by:  As directed      Call MD for:  redness, tenderness, or signs of infection (pain, swelling, redness, odor or green/yellow discharge around incision site)    Complete by:  As directed      Call MD for:  severe uncontrolled pain    Complete by:  As directed      Call MD for:  temperature >100.4    Complete by:  As directed      Diet - low sodium heart healthy    Complete by:  As directed      Diet Carb Modified    Complete by:  As directed      Discharge wound care:    Complete by:  As directed   As per Dr. Lajoyce Corners - please consult for follow up.     Increase activity slowly    Complete by:  As directed   Non Weight Bearing on Right Lower extremity.            Medication List    STOP taking these medications        NOVOLIN R RELION IJ     sulfamethoxazole-trimethoprim 800-160 MG tablet  Commonly known as:  BACTRIM DS,SEPTRA DS      TAKE these medications        acetaminophen 325 MG tablet  Commonly known as:  TYLENOL  Take 2 tablets (650 mg total) by mouth every 6 (six) hours as needed for mild pain, fever or headache (or Fever >/= 101).     bag balm Oint ointment  Apply 1 application topically as needed for dry skin.     cholecalciferol 1000 UNITS tablet  Commonly known as:  VITAMIN D  Take 1,000 Units by mouth daily.     gabapentin 300 MG capsule  Commonly  known as:  NEURONTIN  Take 1 capsule (300 mg total) by mouth 3 (three) times daily.     insulin aspart 100 UNIT/ML injection  Commonly known as:  novoLOG  Inject 0-9 Units into the skin 3 (three) times daily with meals. CBG < 70: implement hypoglycemia protocol CBG 70 - 120: 0 units CBG 121 - 150: 1 unit CBG 151 - 200: 2 units CBG 201 - 250: 3 units CBG 251 - 300: 5 units CBG 301 - 350: 7 units CBG 351 - 400: 9 units CBG > 400: call MD.     insulin aspart 100 UNIT/ML injection  Commonly known as:  novoLOG  Inject 10 Units into the skin 3 (three) times daily with meals.     Insulin  Glargine 300 UNIT/ML Sopn  Commonly known as:  TOUJEO SOLOSTAR  Inject 35 Units into the skin at bedtime.     methocarbamol 500 MG tablet  Commonly known as:  ROBAXIN  Take 1 tablet (500 mg total) by mouth every 6 (six) hours as needed for muscle spasms.     oxyCODONE 5 MG immediate release tablet  Commonly known as:  Oxy IR/ROXICODONE  Take 1-2 tablets (5-10 mg total) by mouth every 4 (four) hours as needed for moderate pain or severe pain.     pravastatin 40 MG tablet  Commonly known as:  PRAVACHOL  TAKE ONE TABLET BY MOUTH ONCE DAILY     tadalafil 20 MG tablet  Commonly known as:  CIALIS  Take 1 tablet (20 mg total) by mouth daily as needed for erectile dysfunction.       Follow-up Information    Follow up with DUDA,MARCUS V, MD In 2 weeks.   Specialty:  Orthopedic Surgery   Contact information:   [address] [place] [address] [phone]        The results of significant diagnostics from this hospitalization (including imaging, microbiology, ancillary and laboratory) are listed below for reference.    Significant Diagnostic Studies: Dg Chest 2 View  06/03/2015  CLINICAL DATA:  Preoperative for right below the knee a PTH EXAM: CHEST  2 VIEW COMPARISON:  04/28/2015 chest radiograph FINDINGS: Stable cardiomediastinal silhouette with normal heart size accounting for low lung  volumes. No pneumothorax. No pleural effusion. Mild bibasilar atelectasis. No pulmonary edema. IMPRESSION: Low lung volumes with mild bibasilar atelectasis. Electronically Signed   By: Delbert Phenix M.D.   On: 06/03/2015 08:11   Dg Foot Complete Right  06/01/2015  CLINICAL DATA:  Diabetic ulcer plantar past RIGHT foot at calcaneus question osteomyelitis, type II diabetes mellitus, diabetic polyneuropathy EXAM: RIGHT FOOT COMPLETE - 3+ VIEW COMPARISON:  09/15/2013 FINDINGS: Osseous demineralization. Joint spaces preserved. Scattered soft tissue swelling. Small soft tissue wound/ulcer at plantar aspect of calcaneus. Mild cortical irregularity along the plantar aspect of the posterior calcaneus similar to previous exam. No definite acute fracture, dislocation or new bone destruction. Scattered small vessel vascular calcification. IMPRESSION: Chronic cortical irregularity at plantar aspect of posterior calcaneus little changed from previous exam without definite evidence of acute bone destruction to suggest acute osteomyelitis. If osteomyelitis remains a clinical concern, recommend followup MR imaging. Electronically Signed   By: Ulyses Southward M.D.   On: 06/01/2015 14:53    Microbiology: Recent Results (from the past 240 hour(s))  Blood culture (routine x 2)     Status: None (Preliminary result)   Collection Time: 06/01/15  9:04 PM  Result Value Ref Range Status   Specimen Description BLOOD BLOOD LEFT FOREARM  Final   Special Requests BOTTLES DRAWN AEROBIC AND ANAEROBIC  Final   Culture   Final    NO GROWTH 4 DAYS Performed at [facility]    Report Status PENDING  Incomplete  Blood culture (routine x 2)     Status: None (Preliminary result)   Collection Time: 06/01/15  9:05 PM  Result Value Ref Range Status   Specimen Description BLOOD BLOOD RIGHT FOREARM  Final   Special Requests BOTTLES DRAWN AEROBIC AND ANAEROBIC  Final   Culture   Final    NO GROWTH 4 DAYS Performed at [facility]    Report Status PENDING  Incomplete  Surgical pcr screen     Status: None  Collection Time: 06/02/15  7:31 PM  Result Value Ref Range Status   MRSA, PCR NEGATIVE NEGATIVE Final   Staphylococcus aureus NEGATIVE NEGATIVE Final    Comment:        The Xpert SA Assay (FDA approved for NASAL specimens in patients over 72 years of age), is one component of a comprehensive surveillance program.  Test performance has been validated by [facility] for patients greater than or equal to 60 year old. It is not intended to diagnose infection nor to guide or monitor treatment.      Labs: Basic Metabolic Panel:  Recent Labs Lab 06/01/15 1149 06/02/15 0250  NA 135 135  K 4.0 4.1  CL 103 102  CO2 25 27  GLUCOSE 215* 227*  BUN 13 10  CREATININE 0.80 0.90  CALCIUM 8.9 8.4*   Liver Function Tests:  Recent Labs Lab 06/01/15 1149  AST 35  ALT 48  ALKPHOS 78  BILITOT 0.4  PROT 7.5  ALBUMIN 3.3*   No results for input(s): LIPASE, AMYLASE in the last 168 hours. No results for input(s): AMMONIA in the last 168 hours. CBC:  Recent Labs Lab 06/01/15 1149 06/02/15 0250 06/07/15 0544  WBC 14.5* 11.6* 12.2*  NEUTROABS 10.2*  --   --   HGB 14.0 12.9* 13.2  HCT 42.1 40.3 41.3  MCV 91.9 93.5 91.6  PLT 385 386 409*   Cardiac Enzymes: No results for input(s): CKTOTAL, CKMB, CKMBINDEX, TROPONINI in the last 168 hours. BNP: BNP (last 3 results) No results for input(s): BNP in the last 8760 hours.  ProBNP (last 3 results) No results for input(s): PROBNP in the last 8760 hours.  CBG:  Recent Labs Lab 06/06/15 0644 06/06/15 1131 06/06/15 1634 06/06/15 2241 06/07/15 0629  GLUCAP 194* 208* 213* 204* 204*     Additional labs: 1. Surgical pathology 06/03/15: Diagnosis Leg, amputation, right lower - SKIN WITH ULCERATION, INFLAMMATION, AND NECROSIS. - OSTEOMYELITIS. - MODERATE ATHEROSCLEROSIS. - THE SURGICAL RESECTION MARGINS ARE HISTOLOGICALLY  VIABLE.   Signed:  Marcellus Scott, MD, FACP, FHM. [facility] Pager [phone]  If 7PM-7AM, please contact night-coverage [email or web address] Password [email or web address] 06/07/2015, 11:25 AM

## 2015-06-07 NOTE — Care Management Note (Signed)
Case Management Note  Patient Details  Name: Hoy MornVergil T Vastine MRN: [id number] Date of Birth: 1960-12-06  Subjective/Objective:           Admitted with right foot infection , s/p right BKA         Action/Plan: PT/OT recommended inpatient rehab. [facility] inpatient [facility] to admit patient today.  Expected Discharge Date:   (UNKNOWN)               Expected Discharge Plan:  IP Rehab Facility  In-House Referral:  Clinical Social Work  Discharge planning Services  CM Consult  Post Acute Care Choice:  IP Rehab Choice offered to:     DME Arranged:    DME Agency:     HH Arranged:    HH Agency:     Status of Service:  Completed, signed off  Medicare Important Message Given:    Date Medicare IM Given:    Medicare IM give by:    Date Additional Medicare IM Given:    Additional Medicare Important Message give by:     If discussed at Long Length of Stay Meetings, dates discussed:    Additional Comments:  Monica BectonKrieg, Jerald Villalona Watson, RN 06/07/2015, 11:26 AM

## 2015-06-08 ENCOUNTER — Inpatient Hospital Stay (HOSPITAL_COMMUNITY): Payer: Medicaid Other | Admitting: Physical Therapy

## 2015-06-08 ENCOUNTER — Inpatient Hospital Stay (HOSPITAL_COMMUNITY): Payer: Self-pay | Admitting: Occupational Therapy

## 2015-06-08 LAB — GLUCOSE, CAPILLARY: Glucose-Capillary: 272 mg/dL — ABNORMAL HIGH (ref 65–99)

## 2015-06-08 MED ORDER — INSULIN ASPART 100 UNIT/ML ~~LOC~~ SOLN
12.0000 [IU] | Freq: Three times a day (TID) | SUBCUTANEOUS | Status: DC
  Administered 2015-06-08 – 2015-06-14 (×20): 12 [IU] via SUBCUTANEOUS

## 2015-06-08 MED ORDER — INSULIN GLARGINE 100 UNIT/ML ~~LOC~~ SOLN
35.0000 [IU] | Freq: Every day | SUBCUTANEOUS | Status: DC
  Administered 2015-06-08 – 2015-06-13 (×6): 35 [IU] via SUBCUTANEOUS
  Filled 2015-06-08 (×7): qty 0.35

## 2015-06-08 NOTE — Progress Notes (Signed)
[facility]     PROGRESS NOTE    Subjective/Complaints:  Patient seen sitting up in bed. He notes pain in his knee. Yesterday, while in therapy since he slipped and nearly fell, but feels that he sprained his knee. He would like to see his Careers advisersurgeon.  ROS: + Right knee pain. Denies CP, SOB, N/V/D.  Objective: Vital Signs: Blood pressure 118/86, pulse 88, temperature 98.9 F (37.2 C), temperature source Oral, resp. rate 18, height 6' (1.829 m), weight 82.872 kg (182 lb 11.2 oz), SpO2 96 %. No results found.  Recent Labs  06/07/15 0544 06/07/15 1550  WBC 12.2* 13.2*  HGB 13.2 13.9  HCT 41.3 41.7  PLT 409* 435*    Recent Labs  06/07/15 1550  NA 134*  K 4.4  CL 96*  GLUCOSE 280*  BUN 17  CREATININE 0.86  CALCIUM 9.3   CBG (last 3)   Recent Labs  06/07/15 1126 06/07/15 2107 06/08/15 0644  GLUCAP 200* 245* 272*    Wt Readings from Last 3 Encounters:  06/07/15 82.872 kg (182 lb 11.2 oz)  06/01/15 88.86 kg (195 lb 14.4 oz)  05/12/15 86.093 kg (189 lb 12.8 oz)    Physical Exam:  BP 118/86 mmHg  Pulse 88  Temp(Src) 98.9 F (37.2 C) (Oral)  Resp 18  Ht 6' (1.829 m)  Wt 82.872 kg (182 lb 11.2 oz)  BMI 24.77 kg/m2  SpO2 96% Constitutional: He appears well-developed and well-nourished. NAD Head: Normocephalic and atraumatic.  Poor dentition  Eyes: Conjunctivae and EOM are normal.  Neck: Normal range of motion. Neck supple. No thyromegaly present.  Cardiovascular: Normal rate and regular rhythm.  Respiratory: Effort normal and breath sounds normal. No respiratory distress.  GI: Soft. Bowel sounds are normal. He exhibits no distension.  Musculoskeletal: He exhibits edema (Stump) and tenderness (Stump).  Strength b/l UE 4+/5 grossly thoughout LLE 4+/5 grossly throughout RLE hip flex 5/5, BKA  Neurological: He is alert and oriented  Follows full commands  Skin: Skin is warm and dry.  BKA site is dressed appropriately  tender  Psychiatric: He has a normal mood and affect. His behavior is normal   Assessment/Plan: 1. 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 Physiatrist is providing close team supervision and 24 hour management of active medical problems listed below. Physiatrist and rehab team continue to assess barriers to discharge/monitor patient progress toward functional and medical goals.  Function:  Bathing Bathing position      Bathing parts      Bathing assist        Upper Body Dressing/Undressing Upper body dressing                    Upper body assist        Lower Body Dressing/Undressing Lower body dressing                                  Lower body assist        Toileting Toileting   Toileting steps completed by patient: Adjust clothing prior to toileting, Performs perineal hygiene, Adjust clothing after toileting      Toileting assist     Transfers Chair/bed transfer             Locomotion Ambulation  Wheelchair          Cognition Comprehension Comprehension assist level: Follows complex conversation/direction with no assist  Expression Expression assist level: Expresses complex ideas: With no assist  Social Interaction Social Interaction assist level: Interacts appropriately with others - No medications needed.  Problem Solving Problem solving assist level: Solves basic 25 - 49% of the time - needs direction more than half the time to initiate, plan or complete simple activities  Memory Memory assist level: Complete Independence: No helper    Medical Problem List and Plan: 1. 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 DVT Prophylaxis/Anticoagulation: Subcutaneous heparin for DVT prophylaxis. Monitor for any bleeding episodes 3.  Pain Management: Neurontin 300 mg TID,Oxycodone and robaxin as needed. Monitor with increased mobility 4. Diabetes mellitus with peripheral neuropathy. Latest hemoglobin A1c 8.2. NovoLog 10 units 3 times a day, Lantus insulin 30 units daily at bedtime, increased to Lantus 35 and Novolog 12 TID with SSI on 10/26. Check blood sugars before meals and at bedtime and provide diabetic teaching  - Fasting CBP 272 this AM.  Will cont to monitor and  5. Neuropsych: This patient is capable of making decisions on his own behalf. 6. Skin/Wound Care: Routine skin checks. 7. Fluids/Electrolytes/Nutrition: Routine I&O's with follow-up chemistries  Stable on 10/26. 8. Hyperlipidemia. Pravachol 9. Polysubstance abuse. Counseling   LOS (Days) 1 A FACE TO FACE EVALUATION WAS PERFORMED  Lashika Erker Karis Juba 06/08/2015 8:53 AM

## 2015-06-08 NOTE — Evaluation (Signed)
Occupational Therapy Assessment and Plan  Patient Details  Name: Gregory May MRN: [id number] Date of Birth: 1961-06-28  OT Diagnosis: acute pain, muscle weakness (generalized) and pain in joint Rehab Potential: Rehab Potential (ACUTE ONLY): Good ELOS: 7 days   Today's Date: 06/08/2015 OT Individual Time: 0930-1100 and 1300-1330 OT Individual Time Calculation (min): 90 min   And 30 min  Problem List:  Patient Active Problem List   Diagnosis Date Noted  . Above knee amputation of right lower extremity ([place]) 06/07/2015  . Infection of right foot 06/01/2015  . Osteomyelitis ([place]) 06/01/2015  . Diabetic foot infection ([place]) 06/01/2015  . Diabetic polyneuropathy associated with type 2 diabetes mellitus ([place]) 12/09/2014  . Numbness and tingling in right hand 12/09/2014  . DJD (degenerative joint disease) of knee 07/21/2014  . Osteoarthritis, hip, bilateral 05/25/2014  . Pulmonary emboli ([place]) 02/08/2014  . DVT (deep venous thrombosis) ([place]) 02/08/2014  . Onychomycosis 10/14/2013  . Pure hypercholesterolemia 09/25/2013  . Diabetes mellitus type 2, controlled, with complications ([place]) 35/59/7416  . Diabetic foot ulcer ([place]) 09/15/2013    Past Medical History:  Past Medical History  Diagnosis Date  . Sebaceous cyst     on back of neck  . Diabetes mellitus     type II  . Pulmonary embolism ([place])   . Deep vein thrombosis (DVT) ([place])   . Arthritis     bilateral hips  . 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 DJD (degenerative joint disease)   . Pulmonary emboli ([place]) 02/08/2014    Date of diagnosis February 08 2014, on chest CTA Hospitalized for 3 days Had some symptoms of shortness of breath, and chest pain With intercurrent DVT of the left LE. Duration of anticoagulation: 8 months. End date 10/11/2014.  Anticoagulant: Lovenox 120 units daily Switched to Eliquis on 05/25/2014 per patient preference    Past Surgical History:  Past Surgical History  Procedure Laterality  Date  . Rotator cuff repair  2005 (approx)    right   . Closed reduction with humer pin insertion  1974    left hip  . Joint replacement  2006 (approx)    right hip replaced  . 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 Total hip arthroplasty Left 07/21/2014    Procedure: TOTAL HIP ARTHROPLASTY ANTERIOR APPROACH;  Surgeon: Ninetta Lights, MD;  Location: [facility];  Service: Orthopedics;  Laterality: Left;  . Hardware removal Left 07/21/2014    Procedure: HARDWARE REMOVAL;  Surgeon: Ninetta Lights, MD;  Location: [facility];  Service: Orthopedics;  Laterality: Left;  . Amputation Right 06/03/2015    Procedure: Right Below Knee Amputation;  Surgeon: [name], MD;  Location: [facility];  Service: Orthopedics;  Laterality: Right;    Assessment & Plan Clinical Impression: Gregory May is a 54 y.o. male with history of diabetes mellitus peripheral neuropathy, multiple joint surgeries, pulmonary embolism/DVT left lower extremity maintained on Eliquis completed February 2016. Patient lives with his brother one level home 4 steps to entry. He will not have 24/7 support at discharge. Independent with crutches prior to admission. Brother works third shift and sleeps during the day. Presented 06/01/2015 through the ED with right foot ulcer swelling as well as pain. He was initially treated at a wound care center with 6 cycles of hyperbaric therapy. WBC 14,500. Urine drug screen positive for cocaine. X-rays right foot suggestive of osteomyelitis. Limb was not felt to  be salvageable. 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 Sharol Given. Hospital course pain management. Subcutaneous heparin for DVT prophylaxis. Physical therapy evaluation completed 06/04/2015 with recommendations of physical medicine rehabilitation consult  Patient transferred to CIR on 06/07/2015 .    Patient currently requires min with basic self-care skills secondary to muscle weakness, decreased cardiorespiratoy  endurance and decreased sitting balance and decreased standing balance.  Prior to hospitalization, patient could complete ADLs/IADLs with modified independent .  Patient will benefit from skilled intervention to increase independence with basic self-care skills and increase level of independence with iADL prior to discharge home with care partner.  Anticipate patient will require intermittent supervision and no further OT follow recommended.  OT - End of Session Activity Tolerance: Tolerates 30+ min activity with multiple rests Endurance Deficit: Yes OT Assessment Rehab Potential (ACUTE ONLY): Good OT Patient demonstrates impairments in the following area(s): Balance;Pain;Endurance;Safety OT Basic ADL's Functional Problem(s): Bathing;Dressing;Toileting OT Advanced ADL's Functional Problem(s): Simple Meal Preparation;Laundry;Light Housekeeping OT Transfers Functional Problem(s): Toilet;Tub/Shower OT Plan OT Intensity: Minimum of 1-2 x/day, 45 to 90 minutes OT Frequency: 5 out of 7 days OT Duration/Estimated Length of Stay: 7 days OT Treatment/Interventions: Balance/vestibular training;Discharge planning;Community reintegration;DME/adaptive equipment instruction;Functional mobility training;Patient/family education;UE/LE Strength taining/ROM;UE/LE Coordination activities;Wheelchair propulsion/positioning;Therapeutic Exercise;Therapeutic Activities OT Self Feeding Anticipated Outcome(s): Independent OT Basic Self-Care Anticipated Outcome(s): Mod I OT Toileting Anticipated Outcome(s): Mod I OT Bathroom Transfers Anticipated Outcome(s): Supervision OT Recommendation Patient destination: Home Follow Up Recommendations: None Equipment Recommended: Tub/shower bench;To be determined   Skilled Therapeutic Intervention Session One: Pt seen for OT ADL bathing and dressing session. Pt in supine upon arrival, agreeable to tx session. He ambulated throughout room with min A using crutches and completed  functional transfers with steadying assist and VCs for safety awareness. He bathed seated on tub bench with supervision and steadying assist to stand to complete buttock hygiene. He completed grooming tasks standing at the sink with steadying assist. He verbalized increased phantom pain in R LE throughout session, especially with ambulation. Education provided regarding tapping and rubbing for pain relief.  He self propelled w/c to ADL apartment. In apartment, he stood with crutches and steadying assist to remove/ replace items in overhead kitchen cabinet with emphasis on functional standing balance and endurance. Pt returned to room at end of session and transferred to recliner, pt left sitting in recliner at end of session, all needs in reach.  Pt educated throughout session regarding role of OT, POC, OT goals, phantom pain, fall risk, energy conservation, deep breathing techniques, and d/c planning.   Session Two: Pt seen for OT therapy session focusing on ADL re-training.Pt in recliner upon arrival finishing lunch.  He completed dressing task seated in recliner with VCs for safety awareness and problem solving. Pt with decreased awareness of R LE, attempting to thread R LE into pants. He stood with crutches and steadying assist to pull pants up.  He completed short distant functional ambulation throughout unit with supervision. He verbalized increased pain upon sit <> stand, however, after standing rest break, pain subsided to manageable level and was able to continue with therapy.  Pt requested return to bed at end of session, left in supine with all needs in reach.   OT Evaluation Precautions/Restrictions  Precautions Precautions: Fall Precaution Comments: R BKA Restrictions Weight Bearing Restrictions: Yes RLE Weight Bearing: Non weight bearing General Chart Reviewed: Yes Pain Pain Assessment Pain Assessment: 0-10 Pain Score: 3  Pain Type: Acute pain Pain Location: Leg Pain  Orientation: Right  Pain Descriptors / Indicators: Other (Comment) (Phantom Pain) Pain Intervention(s): Ambulation/increased activity;Repositioned;Shower Home Living/Prior Functioning Home Living Available Help at Discharge: Family, Available PRN/intermittently (Brother works 11pm-7am) Type of Home: House Home Access: Stairs to enter [employer] of Steps: 4; pt has 3 steps with rail, then porch, then threshold step to enter Entrance Stairs-Rails: Right Home Layout: One level Bathroom Shower/Tub: Tub/shower unit, Architectural technologist: Standard Bathroom Accessibility: Yes (With crutches) Additional Comments: pt reports his house has a lot of clutter, mother was a Ship broker who passed away  Lives With: Family (Brother) IADL History Homemaking Responsibilities: Yes Meal Prep Responsibility: Therapist, occupational Responsibility: Primary Cleaning Responsibility: Primary Mode of Transportation: Car Occupation: Retired Prior Function Level of Independence: Requires assistive device for independence, Independent with basic ADLs, Independent with homemaking with ambulation, Independent with transfers, Independent with gait  Able to Take Stairs?: Yes (using crutches) Driving: Yes Vocation: Unemployed Vocation Requirements: laid off in May 2016 Vision/Perception  Vision- History Baseline Vision/History: No visual deficits;Wears glasses Wears Glasses: At all times Patient Visual Report: No change from baseline  Cognition Overall Cognitive Status: Within Functional Limits for tasks assessed Arousal/Alertness: Awake/alert Orientation Level: Person;Place;Situation Person: Oriented Place: Oriented Situation: Oriented Year: 2016 Month: October Day of Week: Correct Memory: Appears intact Immediate Memory Recall: Sock;Blue;Bed Memory Recall: Sock;Blue;Bed Memory Recall Sock: Without Cue Memory Recall Blue: Without Cue Memory Recall Bed: With Cue Awareness: Appears intact Problem  Solving: Appears intact Safety/Judgment: Appears intact Sensation Sensation Light Touch: Impaired by gross assessment (Pt reports touching R thigh felt like touch to R ankle (phantom sensation)) Coordination Gross Motor Movements are Fluid and Coordinated: No Fine Motor Movements are Fluid and Coordinated: Yes Coordination and Movement Description: Decreased balance due to amputation Motor  Motor Motor: Within Functional Limits Trunk/Postural Assessment  Cervical Assessment Cervical Assessment: Within Functional Limits Thoracic Assessment Thoracic Assessment: Within Functional Limits Lumbar Assessment Lumbar Assessment: Within Functional Limits Postural Control Postural Control: Within Functional Limits  Balance Balance Balance Assessed: Yes Static Sitting Balance Static Sitting - Balance Support: Feet supported Static Sitting - Level of Assistance: 6: Modified independent (Device/Increase time) Static Sitting - Comment/# of Minutes: Sitting EOB Dynamic Sitting Balance Dynamic Sitting - Balance Support: Feet supported Dynamic Sitting - Level of Assistance: 5: Stand by assistance Dynamic Sitting - Balance Activities: Lateral lean/weight shifting;Reaching for objects;Forward lean/weight shifting;Reaching for weighted objects;Reaching across midline Sitting balance - Comments: Seated to complete bathing task Static Standing Balance Static Standing - Balance Support: Right upper extremity supported;Left upper extremity supported;During functional activity Static Standing - Level of Assistance: 4: Min assist Static Standing - Comment/# of Minutes: Standing to complete grooming task Dynamic Standing Balance Dynamic Standing - Balance Support: During functional activity;Bilateral upper extremity supported (On crutches) Dynamic Standing - Level of Assistance: 4: Min assist Extremity/Trunk Assessment RUE Assessment RUE Assessment: Exceptions to [facility] RUE AROM (degrees) Overall AROM  Right Upper Extremity: Within functional limits for tasks performed RUE Strength RUE Overall Strength: Deficits (3+/5 shoulder flexion, 4/5 shoulder extension, 4+/5 elbow flex/ ext) LUE Assessment LUE Assessment: Within Functional Limits   See Function Navigator for Current Functional Status.   Refer to Care Plan for Long Term Goals  Recommendations for other services: None  Discharge Criteria: Patient will be discharged from OT if patient refuses treatment 3 consecutive times without medical reason, if treatment goals not met, if there is a change in medical status, if patient makes no progress towards goals or if patient is discharged from hospital.  The above assessment,  treatment plan, treatment alternatives and goals were discussed and mutually agreed upon: by patient  Ernestina Patches 06/08/2015, 12:47 PM

## 2015-06-08 NOTE — Progress Notes (Signed)
Patient information reviewed and entered into eRehab system by Hollis Oh, RN, CRRN, PPS Coordinator.  Information including medical coding and functional independence measure will be reviewed and updated through discharge.    

## 2015-06-08 NOTE — Interval H&P Note (Signed)
Gregory May was admitted yesterday to Inpatient Rehabilitation with the diagnosis of right BKA secondary to abscess ulceration on 06/03/15..  The patient's history has been reviewed, patient examined, and there is no change in status.  Patient continues to be appropriate for intensive inpatient rehabilitation.  I have reviewed the patient's chart and labs.  Questions were answered to the patient's satisfaction. The PAPE has been reviewed and assessment remains appropriate.  Ankit Karis Jubanil Patel 06/08/2015, 8:56 AM

## 2015-06-08 NOTE — H&P (View-Only) (Signed)
Physical Medicine and Rehabilitation Admission H&P    Chief Complaint  Patient presents with  . Foot Wound   : HPI: Gregory May is a 54 y.o. male with history of diabetes mellitus peripheral neuropathy, multiple joint surgeries, pulmonary embolism/DVT left lower extremity maintained on Eliquis completed February 2016. Patient lives with his brother one level home 4 steps to entry. He will not have 24/7 support at discharge. Independent with crutches prior to admission. Brother works third shift and sleeps during the day. Presented 06/01/2015 through the ED with right foot ulcer swelling as well as pain. He was initially treated at a wound care center with 6 cycles of hyperbaric therapy. WBC 14,500. Urine drug screen positive for cocaine. X-rays right foot suggestive of osteomyelitis. Limb was not felt to be salvageable. 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 Lajoyce Cornersuda. Hospital course pain management. Subcutaneous heparin for DVT prophylaxis. Physical therapy evaluation completed 06/04/2015 with recommendations of physical medicine rehabilitation consult. Patient was admitted for a comprehensive rehab program  ROS Constitutional: Negative for fever and chills.  HENT: Negative for hearing loss.  Eyes: Negative for blurred vision and double vision.  Respiratory: Negative for cough and shortness of breath.  Cardiovascular: Positive for leg swelling. Negative for chest pain and palpitations.  Gastrointestinal: Positive for constipation. Negative for nausea, vomiting and abdominal pain.  Genitourinary: Negative for dysuria and flank pain.  Musculoskeletal: Positive for myalgias and joint pain.  Skin: Negative for rash.  Neurological: Negative for dizziness, seizures, loss of consciousness and headaches.   +Phantom limb pain  All other systems reviewed and are negative   Past Medical History  Diagnosis Date  . Sebaceous cyst     on back of neck  . Diabetes  mellitus     type II  . Pulmonary embolism (HCC)   . Deep vein thrombosis (DVT) (HCC)   . Arthritis     bilateral hips  . 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 DJD (degenerative joint disease)   . Pulmonary emboli (HCC) 02/08/2014    Date of diagnosis February 08 2014, on chest CTA Hospitalized for 3 days Had some symptoms of shortness of breath, and chest pain With intercurrent DVT of the left LE. Duration of anticoagulation: 8 months. End date 10/11/2014.  Anticoagulant: Lovenox 120 units daily Switched to Eliquis on 05/25/2014 per patient preference    Past Surgical History  Procedure Laterality Date  . Rotator cuff repair  2005 (approx)    right   . Closed reduction with humer pin insertion  1974    left hip  . Joint replacement  2006 (approx)    right hip replaced  . 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 Total hip arthroplasty Left 07/21/2014    Procedure: TOTAL HIP ARTHROPLASTY ANTERIOR APPROACH;  Surgeon: Loreta Aveaniel F Murphy, MD;  [facility];  Service: Orthopedics;  Laterality: Left;  . Hardware removal Left 07/21/2014    Procedure: HARDWARE REMOVAL;  Surgeon: Loreta Aveaniel F Murphy, MD;  [facility];  Service: Orthopedics;  Laterality: Left;  . Amputation Right 06/03/2015    Procedure: Right Below Knee Amputation;  Surgeon: Nadara MustardMarcus Duda V, MD;  [facility];  Service: Orthopedics;  Laterality: Right;   Family History  Problem Relation Age of Onset  . Cancer Mother     breast, colon, liver  . Diabetes Father    Social History:  reports that he has never  smoked. He has never used smokeless tobacco. He reports that he drinks alcohol. He reports that he does not use illicit drugs. Allergies: No Known Allergies Medications Prior to Admission  Medication Sig Dispense Refill  . bag balm OINT ointment Apply 1 application topically as needed for dry skin.    . cholecalciferol (VITAMIN D) 1000 UNITS tablet Take 1,000 Units by mouth daily.    . Insulin Glargine  (TOUJEO SOLOSTAR) 300 UNIT/ML SOPN Inject 20 Units into the skin at bedtime.     . Insulin Regular Human (NOVOLIN R RELION IJ) Inject 12-20 Units as directed 3 (three) times daily.     . pravastatin (PRAVACHOL) 40 MG tablet TAKE ONE TABLET BY MOUTH ONCE DAILY 30 tablet 3  . sulfamethoxazole-trimethoprim (BACTRIM DS,SEPTRA DS) 800-160 MG tablet Take 1 tablet by mouth 2 (two) times daily.    . tadalafil (CIALIS) 20 MG tablet Take 1 tablet (20 mg total) by mouth daily as needed for erectile dysfunction. 5 tablet 1  . gabapentin (NEURONTIN) 300 MG capsule Take 1 capsule (300 mg total) by mouth 3 (three) times daily. (Patient not taking: Reported on 06/01/2015) 120 capsule 4    Home: Home Living Family/patient expects to be discharged to:: Unsure [facility] for CIR, if able) Living Arrangements: Other relatives (lives with brother) Available Help at Discharge: Family, Available PRN/intermittently (brother works 3rd shift; sleeps during day) Type of Home: House Home Access: Stairs to enter [employer] of Steps: 4; pt has 3 steps with rail and additional step with no rail Entrance Stairs-Rails: Right Home Layout: One level Bathroom Shower/Tub: Engineer, manufacturing systems: Standard Home Equipment: Merchant navy officer, Bedside commode, Crutches (uses lounge chair in shower) Adaptive Equipment: Reacher, Sock aid Additional Comments: pt reports his house has a lot of clutter   Functional History: Prior Function Level of Independence: Independent with assistive device(s) Comments: uses crutches  Functional Status:  Mobility: Bed Mobility Overal bed mobility: Modified Independent Bed Mobility: Supine to Sit Supine to sit: Supervision Transfers Overall transfer level: Needs assistance Equipment used: Crutches Transfers: Sit to/from Stand Sit to Stand: Min guard General transfer comment: cues for sequence and crutch placement Ambulation/Gait Ambulation/Gait assistance: Min  guard Ambulation Distance (Feet): 175 Feet Assistive device: Crutches Gait Pattern/deviations: Step-to pattern General Gait Details: cues for safety and position of RLE with gait Gait velocity interpretation: Below normal speed for age/gender    ADL: ADL Overall ADL's : Needs assistance/impaired Lower Body Dressing: Sitting/lateral leans, Set up, Supervision/safety Toilet Transfer: Ambulation, Moderate assistance (ambulated-min guard with crutches; sit to stand-Mod assist) Toileting- Clothing Manipulation and Hygiene: Min guard (standing) Functional mobility during ADLs: Min guard (crutches) General ADL Comments: Discussed desensitization techniques for RLE as well as positioning of pillows.   Cognition: Cognition Overall Cognitive Status: Within Functional Limits for tasks assessed Orientation Level: Oriented X4 Cognition Arousal/Alertness: Awake/alert Behavior During Therapy: WFL for tasks assessed/performed Overall Cognitive Status: Within Functional Limits for tasks assessed  Physical Exam: Blood pressure 132/82, pulse 102, temperature 98.6 F (37 C), temperature source Oral, resp. rate 18, height 6' (1.829 m), weight 88.86 kg (195 lb 14.4 oz), SpO2 95 %. Physical Exam Constitutional: He is oriented to person, place, and time. He appears well-developed and well-nourished.  HENT:  Head: Normocephalic and atraumatic.  Poor dentition  Eyes: Conjunctivae and EOM are normal.  Neck: Normal range of motion. Neck supple. No thyromegaly present.  Cardiovascular: Normal rate and regular rhythm.  Respiratory: Effort normal and breath sounds normal. No respiratory distress.  GI: Soft. Bowel sounds are normal. He exhibits no distension.  Musculoskeletal: He exhibits edema (Stump) and tenderness (Stump).  Strength b/l UE 4+/5 grossly thoughout LLE 4+/5 grossly throughout RLE hip flex 5/5, BKA  Neurological: He is alert and oriented to person, place, and time.  Follows full  commands  Skin: Skin is warm and dry.  BKA site is dressed appropriately tender  Psychiatric: He has a normal mood and affect. His behavior is normal   Results for orders placed or performed during the hospital encounter of 06/01/15 (from the past 48 hour(s))  Glucose, capillary     Status: Abnormal   Collection Time: 06/04/15  4:09 PM  Result Value Ref Range   Glucose-Capillary 169 (H) 65 - 99 mg/dL   Comment 1 Notify RN   Glucose, capillary     Status: Abnormal   Collection Time: 06/04/15  9:08 PM  Result Value Ref Range   Glucose-Capillary 204 (H) 65 - 99 mg/dL  Glucose, capillary     Status: Abnormal   Collection Time: 06/05/15  6:33 AM  Result Value Ref Range   Glucose-Capillary 235 (H) 65 - 99 mg/dL  Glucose, capillary     Status: Abnormal   Collection Time: 06/05/15 11:26 AM  Result Value Ref Range   Glucose-Capillary 228 (H) 65 - 99 mg/dL  Glucose, capillary     Status: Abnormal   Collection Time: 06/05/15  4:29 PM  Result Value Ref Range   Glucose-Capillary 211 (H) 65 - 99 mg/dL  Glucose, capillary     Status: Abnormal   Collection Time: 06/05/15  9:49 PM  Result Value Ref Range   Glucose-Capillary 260 (H) 65 - 99 mg/dL  Glucose, capillary     Status: Abnormal   Collection Time: 06/06/15  6:44 AM  Result Value Ref Range   Glucose-Capillary 194 (H) 65 - 99 mg/dL  Glucose, capillary     Status: Abnormal   Collection Time: 06/06/15 11:31 AM  Result Value Ref Range   Glucose-Capillary 208 (H) 65 - 99 mg/dL   No results found.     Medical Problem List and Plan: 1. 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  DVT Prophylaxis/Anticoagulation: Subcutaneous heparin for DVT prophylaxis. Monitor for any bleeding episodes 3. Pain Management: Neurontin 300 mg TID,Oxycodone and robaxin as needed.Monitor with increased mobility 4. Diabetes mellitus with peripheral neuropathy. Latest hemoglobin A1c 8.2. NovoLog 10 units 3 times a  day, Lantus insulin 30 units daily at bedtime. Check blood sugars before meals and at bedtime and provide diabetic teaching 5. Neuropsych: This patient is capable of making decisions on his own behalf. 6. Skin/Wound Care: Routine skin checks. 7. Fluids/Electrolytes/Nutrition: Routine I&O's with follow-up chemistries 8. Hyperlipidemia. Pravachol 9. Polysubstance abuse. Counseling   Post Admission Physician Evaluation: 1. Functional deficits secondary  to ight BKA secondary to abscess ulceration 06/03/2015 2. Patient is admitted to receive collaborative, interdisciplinary care between the physiatrist, rehab nursing staff, and therapy team. 3. Patient's level of medical complexity and substantial therapy needs in context of that medical necessity cannot be provided at a lesser intensity of care such as a SNF. 4. Patient has experienced substantial functional loss from his/her baseline which was documented above under the "Functional History" and "Functional Status" headings.  Judging by the patient's diagnosis, physical exam, and functional history, the patient has potential for functional progress which will result in measurable gains while on inpatient rehab.  These gains will be of substantial and practical  use upon discharge  in facilitating mobility and self-care at the household level. 5. Physiatrist will provide 24 hour management of medical needs as well as oversight of the therapy plan/treatment and provide guidance as appropriate regarding the interaction of the two. 6. 24 hour rehab nursing will assist with safety, skin/wound care, disease management, medication administration, pain management and patient education and help integrate therapy concepts, techniques,education, etc. 7. PT will assess and treat for/with: Lower extremity strength, range of motion, stamina, balance, functional mobility, safety, adaptive techniques and equipment, woundcare, coping skills, pain control, pre-prosthetic  training and education.   Goals are: mod I/Supervision. 8. OT will assess and treat for/with: ADL's, functional mobility, safety, upper extremity strength, adaptive techniques and equipment, wound mgt, ego support, and pre-prosthetic education, community reintegration.   Goals are: Mod I/Supervision. Therapy may not proceed with showering this patient. 9. Case Management and Social Worker will assess and treat for psychological issues and discharge planning. 10. Team conference will be held weekly to assess progress toward goals and to determine barriers to discharge. 11. Patient will receive at least 3 hours of therapy per day at least 5 days per week. 12. ELOS: 10-12 days.       13. Prognosis:  excellent  Maryla Morrow, MD  06/06/2015

## 2015-06-08 NOTE — Progress Notes (Signed)
Physical Therapy Assessment and Plan  Patient Details  Name: Gregory May MRN: [id number] Date of Birth: Aug 30, 1960  PT Diagnosis: Abnormal posture, Difficulty walking, Edema, Muscle weakness and Pain in right lower extremity Rehab Potential: Good ELOS: 5-7 days   Today's Date: 06/08/2015 PT Individual Time: 800-900 and 1500-1530 PT Individual Time Calculation (min): 60 min and 30 min    Problem List:  Patient Active Problem List   Diagnosis Date Noted  . Above knee amputation of right lower extremity ([place]) 06/07/2015  . Infection of right foot 06/01/2015  . Osteomyelitis ([place]) 06/01/2015  . Diabetic foot infection ([place]) 06/01/2015  . Diabetic polyneuropathy associated with type 2 diabetes mellitus ([place]) 12/09/2014  . Numbness and tingling in right hand 12/09/2014  . DJD (degenerative joint disease) of knee 07/21/2014  . Osteoarthritis, hip, bilateral 05/25/2014  . Pulmonary emboli ([place]) 02/08/2014  . DVT (deep venous thrombosis) ([place]) 02/08/2014  . Onychomycosis 10/14/2013  . Pure hypercholesterolemia 09/25/2013  . Diabetes mellitus type 2, controlled, with complications ([place]) 68/34/1960  . Diabetic foot ulcer ([place]) 09/15/2013    Past Medical History:  Past Medical History  Diagnosis Date  . Sebaceous cyst     on back of neck  . Diabetes mellitus     type II  . Pulmonary embolism ([place])   . Deep vein thrombosis (DVT) ([place])   . Arthritis     bilateral hips  . 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 DJD (degenerative joint disease)   . Pulmonary emboli ([place]) 02/08/2014    Date of diagnosis February 08 2014, on chest CTA Hospitalized for 3 days Had some symptoms of shortness of breath, and chest pain With intercurrent DVT of the left LE. Duration of anticoagulation: 8 months. End date 10/11/2014.  Anticoagulant: Lovenox 120 units daily Switched to Eliquis on 05/25/2014 per patient preference    Past Surgical History:  Past Surgical History  Procedure Laterality  Date  . Rotator cuff repair  2005 (approx)    right   . Closed reduction with humer pin insertion  1974    left hip  . Joint replacement  2006 (approx)    right hip replaced  . Total hip arthroplasty Left 07/21/2014    Gregory May  . Total hip arthroplasty Left 07/21/2014    Procedure: TOTAL HIP ARTHROPLASTY ANTERIOR APPROACH;  Surgeon: Gregory Lights, MD;  Location: [facility];  Service: Orthopedics;  Laterality: Left;  . Hardware removal Left 07/21/2014    Procedure: HARDWARE REMOVAL;  Surgeon: Gregory Lights, MD;  Location: [facility];  Service: Orthopedics;  Laterality: Left;  . Amputation Right 06/03/2015    Procedure: Right Below Knee Amputation;  Surgeon: Gregory Minion, MD;  Location: [facility];  Service: Orthopedics;  Laterality: Right;    Assessment & Plan Clinical Impression: Gregory May is a 54 y.o. male with history of diabetes mellitus peripheral neuropathy, multiple joint surgeries, pulmonary embolism/DVT left lower extremity maintained on Eliquis completed February 2016. Patient lives with his brother one level home 4 steps to entry. He will not have 24/7 support at discharge. Independent with crutches prior to admission. Brother works third shift and sleeps during the day. Presented 06/01/2015 through the ED with right foot ulcer swelling as well as pain. He was initially treated at a wound care center with 6 cycles of hyperbaric therapy. WBC 14,500. Urine drug screen positive for cocaine. X-rays right foot suggestive of osteomyelitis. Limb was not felt to  be salvageable. Underwent right transtibial amputation 06/03/2015 per Gregory. Sharol May. Hospital course pain management. Subcutaneous heparin for DVT prophylaxis. Physical therapy evaluation completed 06/04/2015 with recommendations of physical medicine rehabilitation consult. Patient transferred to CIR on 06/07/2015.   Patient currently requires supervision-min A with mobility secondary to muscle weakness and muscle joint tightness, decreased  cardiorespiratoy endurance and decreased standing balance, decreased postural control and decreased balance strategies.  Prior to hospitalization, patient was modified independent  with mobility and lived with Family (Brother) in a House home.  Home access is 4; pt has 3 steps with rail, then porch, then threshold step to enterStairs to enter.  Patient will benefit from skilled PT intervention to maximize safe functional mobility, minimize fall risk and decrease caregiver burden for planned discharge home with intermittent assist.  Anticipate patient will benefit from follow up [facility] at discharge.  PT - End of Session Activity Tolerance: Tolerates 30+ min activity with multiple rests Endurance Deficit: Yes PT Assessment Rehab Potential (ACUTE/IP ONLY): Good Barriers to Discharge: Decreased caregiver support PT Patient demonstrates impairments in the following area(s): Balance;Edema;Endurance;Motor;Pain;Safety;Sensory PT Transfers Functional Problem(s): Bed Mobility;Bed to Chair;Car;Furniture;Floor PT Locomotion Functional Problem(s): Ambulation;Wheelchair Mobility;Stairs PT Plan PT Intensity: Minimum of 1-2 x/day ,45 to 90 minutes PT Frequency: 5 out of 7 days PT Duration Estimated Length of Stay: 5-7 days PT Treatment/Interventions: Ambulation/gait training;Balance/vestibular training;Community reintegration;Discharge planning;Disease management/prevention;DME/adaptive equipment instruction;Functional mobility training;Neuromuscular re-education;Pain management;Patient/family education;Psychosocial support;Stair training;Therapeutic Activities;Therapeutic Exercise;UE/LE Coordination activities;UE/LE Strength taining/ROM;Wheelchair propulsion/positioning PT Transfers Anticipated Outcome(s): mod I PT Locomotion Anticipated Outcome(s): mod I ambulator PT Recommendation Follow Up Recommendations: Home health PT Patient destination: Home Equipment Recommended: None recommended by PT Equipment  Details: patient has crutches  Skilled Therapeutic Intervention Treatment 1: Skilled therapeutic intervention initiated after completion of evaluation. Discussed with patient falls risk, safety within room, and focus of therapy during stay. Discussed possible length of stay, goals, and follow-up therapy. Patient educated on R residual limb positioning for edema control and prevention of contracture, verbalized understanding and demonstrated good carryover throughout session. Patient at overall supervision-min guard level with use of crutches limited primarily by RLE pain due to dependent position or RLE phantom pain, see below for further details. Retrieved amputee support pad for patient, patient left sitting in wheelchair with all needs within reach.   Treatment 2: Patient received in bed, reporting need to urinate. Patient required greatly increased time to come to sitting edge of bed and transfer sit > stand due to increased RLE pain in dependent position, RN notified. Patient ambulated to bathroom and sink for hand hygiene and stood to urinate with min guard. Gait to/from ortho gym to perform simulated car transfer to sedan height and short distance gait on compliant uneven surfaces using crutches with supervision. Patient left semi reclined in bed with all needs within reach, educated on bed functions to lower knee position when [facility] raised to maintain RLE extension.   PT Evaluation Precautions/Restrictions Precautions Precautions: Fall Precaution Comments: R BKA Restrictions Weight Bearing Restrictions: Yes RLE Weight Bearing: Non weight bearing Pain Pain Assessment Pain Assessment: 0-10 Pain Score: 7  Pain Type: Acute pain Pain Location: Leg Pain Orientation: Right Pain Descriptors / Indicators: Other (Comment) (phantom pain) Pain Onset: On-going Pain Intervention(s): Ambulation/increased activity;Repositioned (educated on desensitization techniques) Home Living/Prior  Functioning Home Living Available Help at Discharge: Family;Available PRN/intermittently (brother he lives with works 3rd shift (11-7), other brother works during day) Type of Home: House Home Access: Stairs to enter [employer] of Steps: 4; pt has 3  steps with rail, then porch, then threshold step to enter Entrance Stairs-Rails: Right Home Layout: One level Bathroom Shower/Tub: Tub/shower unit;Curtain Biochemist, clinical: Standard Bathroom Accessibility: Yes Additional Comments: pt reports his house has a lot of clutter, mother was a hoarder who passed away  Lives With: Family (brother) Prior Function Level of Independence: Requires assistive device for independence;Independent with basic ADLs;Independent with homemaking with ambulation;Independent with transfers;Independent with gait  Able to Take Stairs?: Yes (using crutches) Driving: Yes Vocation: Unemployed Vocation Requirements: laid off in May 2016 per pt report Vision/Perception   No changes from baseline  Cognition Overall Cognitive Status: Within Functional Limits for tasks assessed Arousal/Alertness: Awake/alert Memory: Appears intact Awareness: Appears intact Problem Solving: Appears intact Safety/Judgment: Appears intact Sensation Sensation Light Touch: Appears Intact Stereognosis: Appears Intact Hot/Cold: Appears Intact Proprioception: Appears Intact Coordination Gross Motor Movements are Fluid and Coordinated: No Fine Motor Movements are Fluid and Coordinated: Yes Coordination and Movement Description: limited by RLE phantom pain and R knee pain Motor  Motor Motor: Within Functional Limits  Mobility Bed Mobility Bed Mobility: Rolling Left;Supine to Sit;Sit to Supine;Rolling Right Rolling Right: 5: Supervision Rolling Left: 5: Supervision Supine to Sit: 5: Supervision;With rails Sit to Supine: 5: Supervision;With rail Transfers Transfers: Yes Sit to Stand: 5: Supervision;4: Min guard;With upper  extremity assist;From chair/3-in-1;From bed;With armrests Stand to Sit: 5: Supervision;With upper extremity assist;To chair/3-in-1;To bed;4: Min guard Squat Pivot Transfers: 5: Supervision;With upper extremity assistance Squat Pivot Transfer Details: Verbal cues for technique Locomotion  Ambulation Ambulation: Yes Ambulation/Gait Assistance: 4: Min guard Ambulation Distance (Feet): 100 Feet Assistive device: Crutches Gait Gait: Yes Gait Pattern: Impaired Gait Pattern: Trunk flexed (hop-to) Gait velocity: 10 MWT = 0.5 m/s Stairs / Additional Locomotion Stairs: Yes Stairs Assistance: 4: Min guard Stair Management Technique: With crutches;Forwards Number of Stairs: 8 Height of Stairs: 3 Ramp: Not tested (comment) Curb: Not tested (comment) Architect: Yes Wheelchair Assistance: 5: Careers information officer: Both upper extremities Wheelchair Parts Management: Supervision/cueing Distance: 150 ft  Trunk/Postural Assessment  Cervical Assessment Cervical Assessment: Exceptions to [facility] (forward head) Thoracic Assessment Thoracic Assessment: Exceptions to [facility] (thoracic kyphosis) Lumbar Assessment Lumbar Assessment: Exceptions to WFL (decreased lordosis) Postural Control Postural Control: Deficits on evaluation Protective Responses: delayed, limited by RLE pain  Balance Balance Balance Assessed: Yes Static Sitting Balance Static Sitting - Balance Support: Feet supported Static Sitting - Level of Assistance: 6: Modified independent (Device/Increase time) Static Sitting - Comment/# of Minutes: Sitting EOB Dynamic Sitting Balance Dynamic Sitting - Balance Support: Feet supported Dynamic Sitting - Level of Assistance: 5: Stand by assistance Dynamic Sitting - Balance Activities: Lateral lean/weight shifting;Reaching for objects;Forward lean/weight shifting;Reaching for weighted objects;Reaching across midline Sitting balance - Comments: Seated to  complete bathing task Static Standing Balance Static Standing - Balance Support: Right upper extremity supported;Left upper extremity supported;During functional activity Static Standing - Level of Assistance: 4: Min assist Static Standing - Comment/# of Minutes: Standing to complete grooming task Dynamic Standing Balance Dynamic Standing - Balance Support: During functional activity;Bilateral upper extremity supported (On crutches) Dynamic Standing - Level of Assistance: 4: Min assist Extremity Assessment  RUE Assessment RUE Assessment: Exceptions to [facility] RUE AROM (degrees) Overall AROM Right Upper Extremity: Within functional limits for tasks performed RUE Strength RUE Overall Strength: Deficits (3+/5 shoulder flexion, 4/5 shoulder extension, 4+/5 elbow flex/ ext) LUE Assessment LUE Assessment: Within Functional Limits RLE Assessment RLE Assessment: Exceptions to [facility] RLE AROM (degrees) Overall AROM Right Lower Extremity: Deficits;Due to pain RLE  Overall AROM Comments: decreased knee extension, painful RLE Strength RLE Overall Strength: Deficits;Due to pain RLE Overall Strength Comments: hip flexion 4/5, UTA knee flexion/extension due to increased pain to touch  LLE Assessment LLE Assessment: Within Functional Limits (grossly 5/5 except hip flexion 4/5)   See Function Navigator for Current Functional Status.   Refer to Care Plan for Long Term Goals  Recommendations for other services: None  Discharge Criteria: Patient will be discharged from PT if patient refuses treatment 3 consecutive times without medical reason, if treatment goals not met, if there is a change in medical status, if patient makes no progress towards goals or if patient is discharged from hospital.  The above assessment, treatment plan, treatment alternatives and goals were discussed and mutually agreed upon: by patient  Laretta Alstrom 06/08/2015, 3:35 PM

## 2015-06-09 ENCOUNTER — Inpatient Hospital Stay (HOSPITAL_COMMUNITY): Payer: Medicaid Other

## 2015-06-09 ENCOUNTER — Inpatient Hospital Stay (HOSPITAL_COMMUNITY): Payer: Self-pay | Admitting: Occupational Therapy

## 2015-06-09 ENCOUNTER — Inpatient Hospital Stay (HOSPITAL_COMMUNITY): Payer: Self-pay

## 2015-06-09 DIAGNOSIS — Z89511 Acquired absence of right leg below knee: Secondary | ICD-10-CM

## 2015-06-09 DIAGNOSIS — M25561 Pain in right knee: Secondary | ICD-10-CM

## 2015-06-09 DIAGNOSIS — E1165 Type 2 diabetes mellitus with hyperglycemia: Secondary | ICD-10-CM

## 2015-06-09 DIAGNOSIS — E1140 Type 2 diabetes mellitus with diabetic neuropathy, unspecified: Secondary | ICD-10-CM

## 2015-06-09 LAB — GLUCOSE, CAPILLARY
Glucose-Capillary: 274 mg/dL — ABNORMAL HIGH (ref 65–99)
Glucose-Capillary: 213 mg/dL — ABNORMAL HIGH (ref 65–99)
Glucose-Capillary: 94 mg/dL (ref 65–99)
Glucose-Capillary: 124 mg/dL — ABNORMAL HIGH (ref 65–99)
Glucose-Capillary: 144 mg/dL — ABNORMAL HIGH (ref 65–99)
Glucose-Capillary: 83 mg/dL (ref 65–99)
Glucose-Capillary: 209 mg/dL — ABNORMAL HIGH (ref 65–99)
Glucose-Capillary: 154 mg/dL — ABNORMAL HIGH (ref 65–99)

## 2015-06-09 NOTE — Progress Notes (Signed)
Physical Therapy Session Note  Patient Details  Name: Gregory May MRN: [id number] Date of Birth: Nov 29, 1960  Today's Date: 06/09/2015 PT Individual Time: 0800-0900 PT Individual Time Calculation (min): 60 min   Short Term Goals: Week 1:  PT Short Term Goal 1 (Week 1): = LTGs due to anticipated LOS  Skilled Therapeutic Interventions/Progress Updates:    Session focused on education in regards to phantom pain/sensation, functional transfers with crutches at supervision level for safety including toileting, gait with crutches down to therapy gym at supervision level with steady assist intermittently for turns, neuro re-ed for mirror therapy to address pain in residual limb while performing LAQ, marches, and ankle pumps x 30 reps with pt reports decrease in pain, and stair negotiation training with crutches up small steps (3") x 2 trials (8 steps each time) to prepare for home entry. Pain limiting pt initially but able to work through it. Pt still fearful to try taller steps this AM but will work towards this in future session. End of session positioned in supine with ice pack to R knee.  Therapy Documentation Precautions:  Precautions Precautions: Fall Precaution Comments: R BKA Restrictions Weight Bearing Restrictions: Yes RLE Weight Bearing: Non weight bearing  Pain: Reports pain in R knee and phantom pain. Pt reports his R knee and hip has been hurting since he "slipped" on the 5th floor. PA Dan speaking with patient in regards to this with recommendation for using ice on knee.   See Function Navigator for Current Functional Status.   Therapy/Group: Individual Therapy  Karolee StampsGray, Ofelia Podolski Darrol PokeBrescia  Tavaughn Silguero B. Sahily Biddle, PT, DPT  06/09/2015, 9:22 AM

## 2015-06-09 NOTE — Progress Notes (Signed)
[facility] PHYSICAL MEDICINE & REHABILITATION     PROGRESS NOTE    Subjective/Complaints:  Right knee still sore. Having phantom sensations also but not really pain. Therapy going fairly well as a whole.  ROS: + Right knee pain. Denies CP, SOB, N/V/D.  Objective: Vital Signs: Blood pressure 121/67, pulse 77, temperature 98.1 F (36.7 C), temperature source Oral, resp. rate 15, height 6' (1.829 m), weight 84.959 kg (187 lb 4.8 oz), SpO2 97 %. No results found.  Recent Labs  06/07/15 0544 06/07/15 1550  WBC 12.2* 13.2*  HGB 13.2 13.9  HCT 41.3 41.7  PLT 409* 435*    Recent Labs  06/07/15 1550  NA 134*  K 4.4  CL 96*  GLUCOSE 280*  BUN 17  CREATININE 0.86  CALCIUM 9.3   CBG (last 3)   Recent Labs  06/07/15 1126 06/07/15 2107 06/08/15 0644  GLUCAP 200* 245* 272*    Wt Readings from Last 3 Encounters:  06/08/15 84.959 kg (187 lb 4.8 oz)  06/01/15 88.86 kg (195 lb 14.4 oz)  05/12/15 86.093 kg (189 lb 12.8 oz)    Physical Exam:  BP 121/67 mmHg  Pulse 77  Temp(Src) 98.1 F (36.7 C) (Oral)  Resp 15  Ht 6' (1.829 m)  Wt 84.959 kg (187 lb 4.8 oz)  BMI 25.40 kg/m2  SpO2 97% Constitutional: He appears well-developed and well-nourished. NAD Head: Normocephalic and atraumatic.  Poor dentition  Eyes: Conjunctivae and EOM are normal.  Neck: Normal range of motion. Neck supple. No thyromegaly present.  Cardiovascular: Normal rate and regular rhythm.  Respiratory: Effort normal and breath sounds normal. No respiratory distress.  GI: Soft. Bowel sounds are normal. He exhibits no distension.  Musculoskeletal: stump in coban wrap. He has pain along medial patella, condyle. Minimal bruising, minimal edema at site.  Strength b/l UE 4+/5 grossly thoughout LLE 4+/5 grossly throughout RLE hip flex 5/5, BKA  Neurological: He is alert and oriented  Follows full commands  Skin: Skin is warm and dry.  BKA site is dressed appropriately tender  Psychiatric: He  has a normal mood and affect. His behavior is normal   Assessment/Plan: 1. 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 Physiatrist is providing close team supervision and 24 hour management of active medical problems listed below. Physiatrist and rehab team continue to assess barriers to discharge/monitor patient progress toward functional and medical goals.  Function:  Bathing Bathing position   Position: Shower  Bathing parts Body parts bathed by patient: Right arm, Left arm, Chest, Abdomen, Front perineal area, Buttocks, Right upper leg, Left upper leg, Right lower leg, Left lower leg Body parts bathed by helper: Back  Bathing assist Assist Level: Touching or steadying assistance(Pt > 75%)      Upper Body Dressing/Undressing Upper body dressing   What is the patient wearing?: Hospital gown                Upper body assist Assist Level: Touching or steadying assistance(Pt > 75%)      Lower Body Dressing/Undressing Lower body dressing   What is the patient wearing?: Underwear, Pants             Socks - Performed by patient:  (L n/a) Socks - Performed by helper: Don/doff right sock              Lower body assist  Toileting Toileting   Toileting steps completed by patient: Adjust clothing prior to toileting, Performs perineal hygiene, Adjust clothing after toileting   Toileting Assistive Devices: Grab bar or rail, Other (comment) (crutiches)  Toileting assist Assist level: Supervision or verbal cues   Transfers Chair/bed transfer   Chair/bed transfer method: Squat pivot Chair/bed transfer assist level: Supervision or verbal cues Chair/bed transfer assistive device: Other (crutches)     Locomotion Ambulation     Max distance: 120 Assist level: Touching or steadying assistance (Pt > 75%)   Wheelchair    Type: Manual Max wheelchair distance: 150 ft Assist Level: No help, No cues, assistive device, takes more than reasonable amount of time  Cognition Comprehension Comprehension assist level: Follows complex conversation/direction with no assist  Expression Expression assist level: Expresses complex ideas: With no assist  Social Interaction Social Interaction assist level: Interacts appropriately with others with medication or extra time (anti-anxiety, antidepressant).  Problem Solving Problem solving assist level: Solves basic problems with no assist  Memory Memory assist level: Complete Independence: No helper    Medical Problem List and Plan: 1. Functional deficits secondary to Right BKA secondary to abscess ulceration 06/03/2015  -pt progressing, using crutches for ambulation--needs to be careful with these 2. DVT Prophylaxis/Anticoagulation: Subcutaneous heparin for DVT prophylaxis. Monitor for any bleeding episodes 3. Pain Management: Neurontin 300 mg TID,Oxycodone and robaxin as needed.   -phantom pain minimal  -having more pain where he struck knee along medial patella/condyle.    -will remove dressing in the AM and inspect incision and knee further. Check xrays of knee if it remains tender 4. Diabetes mellitus with peripheral neuropathy. Latest hemoglobin A1c 8.2. NovoLog 10 units 3 times a day, Lantus insulin 30 units daily at bedtime, increased to Lantus 35 and Novolog 12 TID with SSI on 10/26.   -continue diabetic teaching  -follow cbg's for pattern today.  5. Neuropsych: This patient is capable of making decisions on his own behalf. 6. Skin/Wound Care: Routine skin checks. 7. Fluids/Electrolytes/Nutrition: Routine I&O's with follow-up chemistries  Stable on 10/26 labs noted 8. Hyperlipidemia. Pravachol 9. Polysubstance abuse. Counseling to continue   LOS (Days) 2 A FACE TO FACE EVALUATION WAS PERFORMED  Vaniyah Lansky T 06/09/2015 10:16 AM

## 2015-06-09 NOTE — Progress Notes (Signed)
Occupational Therapy Session Note  Patient Details  Name: Gregory May MRN: [id number] Date of Birth: Jan 09, 1961  Today's Date: 06/09/2015 OT Individual Time: 1100-1200 and 1300-1430 OT Individual Time Calculation (min): 60 min and 90 min   Short Term Goals: Week 1:  OT Short Term Goal 1 (Week 1): STG=LTG due to LOS  Skilled Therapeutic Interventions/Progress Updates:    Session One: Pt seen for OT ADL bathing and dressing session. Pt in supine upon arrival with ice applied to R knee, agreeable to bathing task.  Pt ambulated throughout room using crutches with supervision. He bathed seated on tub bench supervision using lateral leans to complete buttock hygiene for safety. Pt educated regarding lateral leans with recommendation to complete buttock hygiene via lateral leans in shower for safety. He dressed seated on EOB with supervision, completing lateral leans to pull pants up. He transferred to recliner with supervision. Pt left sitting in recliner at end of session, all needs in reach. Pt educated throughout session regarding OT goals, POC, continuum of care, lateral leans and d/c planning.   Session Two: Pt seen for OT therapy session focusing on functional mobility/ transfers and pain manage/ LE ROM. Pt in recliner upon arrival, agreeable to tx session. He ambulated throughout room to complete standing toileting task and hand hygiene with supervision using crutches.  In ADL apartment, he completed simulated tub shower unit, completing with overall supervision and VCs for technique. Education provided regarding safety in the bathroom and DME including grab bars and recommendation for seated bathing tasks. In therapy gym, pt completed mirror box therapy for R LE sitting on EOM. Exercises completed at ankle, knee and hip. Pt voiced feeling relief from pain with mirror box. Pt educated regarding theory of mirror box, how to use, and ways to crease mirror box for home use. On therapy  mat, pt complete LE strengthening/ ROM exercises in supine, sidelying in prone for hip flexio/extension, and hip AB/ADduction. All exercises completed x2 sets of 10 reps.  Pt returned to room at end of session, left sitting in recliner with all needs in reach. Pt educated throughout session regarding phantom pain, residual limb care, mirror box therapy, and d/c planning.    Therapy Documentation Precautions:  Precautions Precautions: Fall Precaution Comments: R BKA Restrictions Weight Bearing Restrictions: Yes RLE Weight Bearing: Non weight bearing Pain: Pain Assessment Pain Assessment: 0-10 Pain Score: 8  Pain Type: Phantom pain Pain Location: Leg Pain Orientation: Right Pain Onset: With Activity Pain Intervention(s): Repositioned; shower; mirror box therapy  See Function Navigator for Current Functional Status.   Therapy/Group: Individual Therapy  Lewis, Eugena Rhue C 06/09/2015, 7:18 AM

## 2015-06-10 ENCOUNTER — Inpatient Hospital Stay (HOSPITAL_COMMUNITY): Payer: Medicaid Other

## 2015-06-10 ENCOUNTER — Inpatient Hospital Stay (HOSPITAL_COMMUNITY): Payer: Self-pay | Admitting: Occupational Therapy

## 2015-06-10 ENCOUNTER — Inpatient Hospital Stay (HOSPITAL_COMMUNITY): Payer: Medicaid Other | Admitting: Occupational Therapy

## 2015-06-10 DIAGNOSIS — Z794 Long term (current) use of insulin: Secondary | ICD-10-CM

## 2015-06-10 DIAGNOSIS — E1142 Type 2 diabetes mellitus with diabetic polyneuropathy: Secondary | ICD-10-CM

## 2015-06-10 LAB — GLUCOSE, CAPILLARY
Glucose-Capillary: 152 mg/dL — ABNORMAL HIGH (ref 65–99)
Glucose-Capillary: 136 mg/dL — ABNORMAL HIGH (ref 65–99)
Glucose-Capillary: 127 mg/dL — ABNORMAL HIGH (ref 65–99)
Glucose-Capillary: 173 mg/dL — ABNORMAL HIGH (ref 65–99)

## 2015-06-10 NOTE — Progress Notes (Signed)
Physical Therapy Session Note  Patient Details  Name: Gregory May MRN: [id number] Date of Birth: [birth year]  Today's Date: 06/10/2015 PT Individual Time: 0930-1030 PT Individual Time Calculation (min): 60 min   Short Term Goals: Week 1:  PT Short Term Goal 1 (Week 1): = LTGs due to anticipated LOS  Skilled Therapeutic Interventions/Progress Updates:    Session focused on introducing HEP for LE strengthening and ROM, basic transfers, gait training with crutches, simulated car transfer with crutches with supervision including managing crutches into car, and stair negotiation for home entry simulation. HEP including quad sets, SLR, knee flexion/extension, and sidelying hip abduction and extension x 10 reps each. Pt able to perform 6" steps today to simulate home entry with steadying assist x 2 trials of 4 steps each. Discussed d/c planning and notified CSW of recommendation for Tues d/c with pt in agreement.  Therapy Documentation Precautions:  Precautions Precautions: Fall Precaution Comments: R BKA Restrictions Weight Bearing Restrictions: Yes RLE Weight Bearing: Non weight bearing  Pain: C/o pain in R knee and residual limb - RN provided pain medication.   See Function Navigator for Current Functional Status.   Therapy/Group: Individual Therapy  Karolee StampsGray, Anitra Doxtater Darrol PokeBrescia  Rand Boller B. Artemisa Sladek, PT, DPT  06/10/2015, 10:45 AM

## 2015-06-10 NOTE — Progress Notes (Signed)
Occupational Therapy Session Note  Patient Details  Name: Hoy MornVergil T Castrillon MRN: [id number] Date of Birth: February 17, 1961  Today's Date: 06/10/2015 OT Individual Time: 1515-1600 OT Individual Time Calculation (min): 45 min    Short Term Goals: Week 1:  OT Short Term Goal 1 (Week 1): STG=LTG due to LOS      Skilled Therapeutic Interventions/Progress Updates:      Pt went from supine to sit with bed rail. Went over phantom pain and light massage for pain relief in residual limb.   Ambulated to toilet with min assist and crutches.  Pt. Ambulated to sink.  Stood for 15 minutes while performing grooming activities. Ppt went back to bed to relieve RLE pain which climbed to 6/10 during standing.     Therapy Documentation Precautions:  Precautions Precautions: Fall Precaution Comments: R BKA Restrictions Weight Bearing Restrictions: Yes RLE Weight Bearing: Non weight bearing      Pain: Pain Assessment Pain Score: 6 Pain Location: Leg Pain Orientation: Right Pain Descriptors / Indicators: Aching and burning pain Pain Intervention(s): Medication (See eMAR)          See Function Navigator for Current Functional Status.   Therapy/Group: Individual Therapy  Humberto Sealsdwards, Deliah Strehlow J 06/10/2015, 3:31 PM

## 2015-06-10 NOTE — Progress Notes (Signed)
Occupational Therapy Session Note  Patient Details  Name: Gregory May MRN: [id number] Date of Birth: 03-26-1961  Today's Date: 06/10/2015 OT Individual Time: [id number] and 1430-1500 OT Individual Time Calculation (min): 60 min and 30 min   Short Term Goals: Week 1:  OT Short Term Goal 1 (Week 1): STG=LTG due to LOS  Skilled Therapeutic Interventions/Progress Updates:    Session One: Pt seen for OT THerapy session focusing on ADL re-training, functional activity tolerance, and functional standing balance. Pt sitting EOB upon arrival eating breakfast, agreeable to tx session. He declined full bathing task and dressed seated on EOB using lateral leans to pull pants up supervision- mod I. He ambulated throughout room using crutches to complete toileting task and hand hygiene.  He was then taken off unit for trick or treating event. He stood ~10 minutes with use of one crutch to hand out candy. Pt required to reach low to obtain candy, and cross midline to access and give out candy. Pt demonstrated good functional activity tolerance and standing balance to complete task.  Pt returned to unit and left sitting in therapy gym with hand off to PT.  Pt educated throughout session regarding OT goals, POC, DME, safety awareness, and d/c planning.   Session Two: Pt seen for OT therapy session focusing on UE strengthening and establishment of HEP. Pt in supine upon arrival, agreeable to tx session. He required increased time upon changing positions (supine> EOB, EOB> stand) due to pain with VCs provided for deep breathing technique.  Pt completed toileting task with supervision, ambulating with crutches throughout room and standing to complete hand hygiene. Pt ambulated to therapy gym with supervision. He completed UE strengthening exercises using level II theraband. Exercises completed to all major UE muscle groups with verbal and tactile cues provided for proper form and technique. Education provided  regarding importance of proper technique, quality vs/ quantity of reps, and breathing pattern. Exercises completed x2 sets of 10 reps.  Pt returned to room at end of session, left with all needs in reach.   Therapy Documentation Precautions:  Precautions Precautions: Fall Precaution Comments: R BKA Restrictions Weight Bearing Restrictions: Yes RLE Weight Bearing: Non weight bearing Pain: Pain Assessment Pain Score: 8  Pain Location: Leg Pain Orientation: Right Pain Descriptors / Indicators: Aching;Other (Comment) (Phantom Pain) Pain Intervention(s): Repositioned;Ambulation/increased activity;RN made aware  See Function Navigator for Current Functional Status.   Therapy/Group: Individual Therapy  Lewis, Skii Cleland C 06/10/2015, 7:20 AM

## 2015-06-10 NOTE — Progress Notes (Signed)
Social Work Patient ID: Gregory May, male   DOB: 08/01/1961, 54 y.o.   MRN: [id number]  Alerted by PT and OT today that they feel pt will be ready for d/c on 11/1.  Have cleared this with Dr. Riley KillSwartz and have discussed with pt who is in agreement.  Have made pt aware that, unfortunately, his current diagnosis will not qualify him for therapy follow up under Medicaid.  He understands and reports he has been given home exercise program and feels comfortable with managing this at home.  Continue to follow.  Chalese Peach, LCSW

## 2015-06-10 NOTE — Progress Notes (Signed)
[facility] PHYSICAL MEDICINE & REHABILITATION     PROGRESS NOTE    Subjective/Complaints:  Has some throbbing pain in the stump when it dangles beneath him. Right knee still quite tender.sugars a little better   ROS:  Denies CP, SOB, N/V/D.  Objective: Vital Signs: Blood pressure 117/75, pulse 86, temperature 98 F (36.7 C), temperature source Oral, resp. rate 17, height 6' (1.829 m), weight 84.959 kg (187 lb 4.8 oz), SpO2 95 %. No results found.  Recent Labs  06/07/15 1550  WBC 13.2*  HGB 13.9  HCT 41.7  PLT 435*    Recent Labs  06/07/15 1550  NA 134*  K 4.4  CL 96*  GLUCOSE 280*  BUN 17  CREATININE 0.86  CALCIUM 9.3   CBG (last 3)   Recent Labs  06/09/15 1620 06/09/15 2131 06/10/15 0645  GLUCAP 209* 154* 152*    Wt Readings from Last 3 Encounters:  06/08/15 84.959 kg (187 lb 4.8 oz)  06/01/15 88.86 kg (195 lb 14.4 oz)  05/12/15 86.093 kg (189 lb 12.8 oz)    Physical Exam:  BP 117/75 mmHg  Pulse 86  Temp(Src) 98 F (36.7 C) (Oral)  Resp 17  Ht 6' (1.829 m)  Wt 84.959 kg (187 lb 4.8 oz)  BMI 25.40 kg/m2  SpO2 95% Constitutional: He appears well-developed and well-nourished. NAD Head: Normocephalic and atraumatic.  Poor dentition  Eyes: Conjunctivae and EOM are normal.  Neck: Normal range of motion. Neck supple. No thyromegaly present.  Cardiovascular: Normal rate and regular rhythm.  Respiratory: Effort normal and breath sounds normal. No respiratory distress.  GI: Soft. Bowel sounds are normal. He exhibits no distension.  Musculoskeletal: stump in coban wrap. He has pain along medial patella, condyle. Minimal bruising, perhaps some fluid underneath patella.  Strength b/l UE 4+/5 grossly thoughout LLE 4+/5 grossly throughout RLE hip flex 5/5, BKA  Neurological: He is alert and oriented  Follows full commands  Skin: Skin is warm and dry.  BKA site is dressed appropriately tender  Psychiatric: He has a normal mood and affect. His  behavior is normal   Assessment/Plan: 1. 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 Physiatrist is providing close team supervision and 24 hour management of active medical problems listed below. Physiatrist and rehab team continue to assess barriers to discharge/monitor patient progress toward functional and medical goals.  Function:  Bathing Bathing position   Position: Shower  Bathing parts Body parts bathed by patient: Right arm, Left arm, Chest, Abdomen, Front perineal area, Buttocks, Right upper leg, Left upper leg, Right lower leg, Left lower leg Body parts bathed by helper: Back  Bathing assist Assist Level: Supervision or verbal cues      Upper Body Dressing/Undressing Upper body dressing   What is the patient wearing?: Pull over shirt/dress     Pull over shirt/dress - Perfomed by patient: Thread/unthread right sleeve, Thread/unthread left sleeve, Put head through opening, Pull shirt over trunk          Upper body assist Assist Level: No help, No cues      Lower Body Dressing/Undressing Lower body dressing   What is the patient wearing?: Underwear, Pants, Non-skid slipper socks Underwear - Performed by patient: Pull underwear up/down, Thread/unthread right underwear leg, Thread/unthread left underwear leg   Pants- Performed by patient: Thread/unthread right pants leg, Thread/unthread left pants leg, Pull pants up/down, Fasten/unfasten  pants       Socks - Performed by patient:  (L n/a) Socks - Performed by helper: Don/doff right sock              Lower body assist        Toileting Toileting   Toileting steps completed by patient: Adjust clothing prior to toileting, Performs perineal hygiene, Adjust clothing after toileting   Toileting Assistive Devices: Grab bar or rail, Other (comment) (crutiches)  Toileting  assist Assist level: Supervision or verbal cues   Transfers Chair/bed transfer   Chair/bed transfer method: Squat pivot Chair/bed transfer assist level: Supervision or verbal cues Chair/bed transfer assistive device: Other (crutches)     Locomotion Ambulation     Max distance: 120 Assist level: Touching or steadying assistance (Pt > 75%)   Wheelchair   Type: Manual Max wheelchair distance: 150 ft Assist Level: No help, No cues, assistive device, takes more than reasonable amount of time  Cognition Comprehension Comprehension assist level: Follows complex conversation/direction with no assist  Expression Expression assist level: Expresses complex ideas: With no assist  Social Interaction Social Interaction assist level: Interacts appropriately with others with medication or extra time (anti-anxiety, antidepressant).  Problem Solving Problem solving assist level: Solves basic problems with no assist  Memory Memory assist level: Complete Independence: No helper    Medical Problem List and Plan: 1. 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 DVT Prophylaxis/Anticoagulation: Subcutaneous heparin for DVT prophylaxis. Monitor for any bleeding episodes 3. Pain Management: Neurontin 300 mg TID,Oxycodone and robaxin as needed.   -phantom pain minimal, does have incisional pain  -having more pain where he struck knee along medial patella/condyle   -ordered xrays of right knee to assess   -will continue with current amputation dressing as it won't affect knee film 4. Diabetes mellitus with peripheral neuropathy. Latest hemoglobin A1c 8.2. NovoLog 10 units 3 times a day, Lantus insulin 30 units daily at bedtime, increased to Lantus 35 and Novolog 12 TID with SSI on 10/26.   -continue diabetic teaching  -cbg's showing some improvement---pattern still irregular--no changes in meds today 5.  Neuropsych: This patient is capable of making decisions on his own behalf. 6. Skin/Wound Care: Routine skin checks. 7. Fluids/Electrolytes/Nutrition: Routine I&O's with follow-up chemistries   8. Hyperlipidemia. Pravachol 9. Polysubstance abuse. Counseling to continue   LOS (Days) 3 A FACE TO FACE EVALUATION WAS PERFORMED  Darlen Gledhill T 06/10/2015 7:37 AM

## 2015-06-10 NOTE — Progress Notes (Signed)
Inpatient Rehabilitation Center Individual Statement of Services  Patient Name:  Gregory May  Date:  06/10/2015  Welcome to the Inpatient Rehabilitation Center.  Our goal is to provide you with an individualized program based on your diagnosis and situation, designed to meet your specific needs.  With this comprehensive rehabilitation program, you will be expected to participate in at least 3 hours of rehabilitation therapies Monday-Friday, with modified therapy programming on the weekends.  Your rehabilitation program will include the following services:  Physical Therapy (PT), Occupational Therapy (OT), 24 hour per day rehabilitation nursing, Case Management (Social Worker), Rehabilitation Medicine, Nutrition Services and Pharmacy Services  Weekly team conferences will be held on Tuesdays to discuss your progress.  Your Social Worker will talk with you frequently to get your input and to update you on team discussions.  Team conferences with you and your family in attendance may also be held.  Expected length of stay:  5 to 7 days  Overall anticipated outcome:  Modified Independent  Depending on your progress and recovery, your program may change. Your Social Worker will coordinate services and will keep you informed of any changes. Your Social Worker's name and contact numbers are listed  below.  The following services may also be recommended but are not provided by the Inpatient Rehabilitation Center:   Driving Evaluations  Home Health Rehabiltiation Services  Outpatient Rehabilitation Services   Arrangements will be made to provide these services after discharge if needed.  Arrangements include referral to agencies that provide these services.  Your insurance has been verified to be:  Applied for Medicaid in August 2016 Your primary doctor is:  Gregory May  Pertinent information will be shared with your doctor and your insurance company.  Social  Worker:  Staci AcostaJenny Lanyla Costello, LCSW  [phone] or [phone]  Information discussed with and copy given to patient by: Elvera LennoxPrevatt, Beuford Garcilazo Capps, 06/10/2015, 8:42 AM

## 2015-06-10 NOTE — Progress Notes (Signed)
Social Work Assessment and Plan  Patient Details  Name: Gregory May MRN: [id number] Date of Birth: [birth year]  Today's Date: 06/10/2015  Problem List:  Patient Active Problem List   Diagnosis Date Noted  . Status post below knee amputation of right lower extremity ([place]) 06/07/2015  . Infection of right foot 06/01/2015  . Osteomyelitis ([place]) 06/01/2015  . Diabetic foot infection ([place]) 06/01/2015  . Diabetic polyneuropathy associated with type 2 diabetes mellitus ([place]) 12/09/2014  . Numbness and tingling in right hand 12/09/2014  . DJD (degenerative joint disease) of knee 07/21/2014  . Osteoarthritis, hip, bilateral 05/25/2014  . Pulmonary emboli ([place]) 02/08/2014  . DVT (deep venous thrombosis) ([place]) 02/08/2014  . Onychomycosis 10/14/2013  . Pure hypercholesterolemia 09/25/2013  . Diabetes mellitus type 2, controlled, with complications ([place]) 50/27/7412  . Diabetic foot ulcer ([place]) 09/15/2013   Past Medical History:  Past Medical History  Diagnosis Date  . Sebaceous cyst     on back of neck  . Diabetes mellitus     type II  . Pulmonary embolism ([place])   . Deep vein thrombosis (DVT) ([place])   . Arthritis     bilateral hips  . 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 DJD (degenerative joint disease)   . Pulmonary emboli ([place]) 02/08/2014    Date of diagnosis February 08 2014, on chest CTA Hospitalized for 3 days Had some symptoms of shortness of breath, and chest pain With intercurrent DVT of the left LE. Duration of anticoagulation: 8 months. End date 10/11/2014.  Anticoagulant: Lovenox 120 units daily Switched to Eliquis on 05/25/2014 per patient preference    Past Surgical History:  Past Surgical History  Procedure Laterality Date  . Rotator cuff repair  2005 (approx)    right   . Closed reduction with humer pin insertion  1974    left hip  . Joint replacement  2006 (approx)    right hip replaced  . 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 Total  hip arthroplasty Left 07/21/2014    Procedure: TOTAL HIP ARTHROPLASTY ANTERIOR APPROACH;  Surgeon: Ninetta Lights, MD;  Location: [facility];  Service: Orthopedics;  Laterality: Left;  . Hardware removal Left 07/21/2014    Procedure: HARDWARE REMOVAL;  Surgeon: Ninetta Lights, MD;  Location: [facility];  Service: Orthopedics;  Laterality: Left;  . Amputation Right 06/03/2015    Procedure: Right Below Knee Amputation;  Surgeon: [name], MD;  Location: [facility];  Service: Orthopedics;  Laterality: Right;   Social History:  reports that he has never smoked. He has never used smokeless tobacco. He reports that he drinks alcohol. He reports that he does not use illicit drugs.  Family / Support Systems Marital Status: Single Patient Roles: Other (Comment) (brother, nephew, friend) Other Supports: Gregory May - aunt - [phone] Anticipated Caregiver: Aunt intermittent daily. She has been helping with his dressing changes daily Ability/Limitations of Caregiver: Elenor Legato works. Brother works third shift who lives with him Caregiver Availability: Intermittent Family Dynamics: Aunt is supportive, but got upset with pt in his hospital room the other day, not happy with pt's choices in friends.  He feels she will come around and help.  Brother who lives with him is not helpful.  Youngest brother IS supportive.  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 Read: Yes Write: Yes Employment Status: Unemployed Date Retired/Disabled/Unemployed: February 02, 2015 Legal History/Current  Legal Issues: none reported Guardian/Conservator: N/A - MD has assessed that pt is capable of making his own decisions   Abuse/Neglect Physical Abuse: Denies Verbal Abuse: Denies Sexual Abuse: Denies Exploitation of patient/patient's resources: Denies Self-Neglect: Denies  Emotional Status Pt's affect, behavior and adjustment status: Pt was a little tearful when talking about all  of the medical things that have happened this year and the changes to his body, he feels aging him beyond his stated age.  He is coping well otherwise and is glad to be done trying to "save" his leg and now he can just move forward. Recent Psychosocial Issues: Pt lost his job in June, he feels due to his multiple medical problems over the year.  His unemployment just ended and he is worried about finances.  Pt's unemployment just ended. Psychiatric History: none reported Substance Abuse History: Pt admits to occasional alcohol use, but denies any current drug use, stating he "left that behind 30+ years ago."  CSW noticed pt was positive for cocaine on admission and asked him about current cocaine use and pt denied it.  Patient / Family Perceptions, Expectations & Goals Pt/Family understanding of illness & functional limitations: Pt understands his condition and limitations.  He has done pretty well at home with occasional support from his aunt.  He does not have any unanswered questions for MD at this time. Premorbid pt/family roles/activities: Pt enjoys watching football and racing.  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 Anticipated changes in roles/activities/participation: Pt has already hired a young man to help with the yard.  He hopes to be able to manage everything else. Pt/family expectations/goals: Pt wants to "walk again and not have pain."  [employer]: Other (Comment) (Food Stamps $88/month; applied for Social Security Disability August 2016 and Medicaid.  Had telephone interview for SSD.) Premorbid Home Care/DME Agencies: None (but has crutches and BSC at home) Transportation available at discharge: aunt Resource referrals recommended: Support group (specify)  Discharge Planning Living Arrangements: Other relatives (brother lives with pt) Support Systems: Other relatives, Friends/neighbors (aunt) Type of Residence:  Private residence Insurance underwriter Resources: Teacher, adult education Resources: Other (Comment), Family Support (Pt's 13 weeks of unemployment just ended.  Pt's brother pays utilities.  Pt was using his 401K for expenses.) Financial Screen Referred: Previously completed Living Expenses: Rent Money Management: Patient Does the patient have any problems obtaining your medications?: Yes (Describe) (Pt has to pay out of pocket for medications.  CSW to see if pt is eligilble for [facility]) Home Management: Pt was doing inside chores and hired young man to help with the yard. Patient/Family Preliminary Plans: Pt plans to return to his home with intermittent help from his aunt. Barriers to Discharge: Steps, Finances Social Work Anticipated Follow Up Needs: HH/OP, Support Group Expected length of stay: [facility] 5- 7 days  Clinical Impression CSW met with pt 06-08-15 to introduce self and role of CSW, as well as to complete assessment.  Pt was glad to be on Rehab and didn't expect to be there long and is already seeing progress in even a short time.  Pt is planning to go home and will have some help from his aunt, as long as they mend things (argument in pt's room day prior), but he felt they would.  Pt's brother who lives with him does not help out, but youngest brother is supportive.  Pt feels comfortable going home, but is worried about finances, as  he no longer has an income and has almost used his entire 401K money.  Awaiting Medicaid and SSD application determination.  CSW will follow pt and assist with DME and [facility] RN, but pt will not be eligible for home therapies due to no insurance.  CSW will also see if pt qualifies for [facility] program. CSW to continue to follow and assist as needed.  Winter Trefz, Silvestre Mesi 06/10/2015, 9:00 AM

## 2015-06-11 ENCOUNTER — Inpatient Hospital Stay (HOSPITAL_COMMUNITY): Payer: Medicaid Other | Admitting: Physical Therapy

## 2015-06-11 ENCOUNTER — Inpatient Hospital Stay (HOSPITAL_COMMUNITY): Payer: Self-pay | Admitting: Occupational Therapy

## 2015-06-11 ENCOUNTER — Encounter (HOSPITAL_COMMUNITY): Payer: Self-pay | Admitting: Occupational Therapy

## 2015-06-11 DIAGNOSIS — E118 Type 2 diabetes mellitus with unspecified complications: Secondary | ICD-10-CM

## 2015-06-11 DIAGNOSIS — S8001XD Contusion of right knee, subsequent encounter: Secondary | ICD-10-CM

## 2015-06-11 LAB — GLUCOSE, CAPILLARY
Glucose-Capillary: 126 mg/dL — ABNORMAL HIGH (ref 65–99)
Glucose-Capillary: 139 mg/dL — ABNORMAL HIGH (ref 65–99)
Glucose-Capillary: 192 mg/dL — ABNORMAL HIGH (ref 65–99)

## 2015-06-11 MED ORDER — INFLUENZA VAC SPLIT QUAD 0.5 ML IM SUSY
0.5000 mL | PREFILLED_SYRINGE | Freq: Once | INTRAMUSCULAR | Status: AC
  Administered 2015-06-11: 0.5 mL via INTRAMUSCULAR
  Filled 2015-06-11: qty 0.5

## 2015-06-11 NOTE — Progress Notes (Signed)
Physical Therapy Session Note  Patient Details  Name: Gregory May MRN: [id number] Date of Birth: 01/04/1961  Today's Date: 06/11/2015 PT Individual Time: 0800-0900 PT Individual Time Calculation (min): 60 min   Short Term Goals: Week 1:  PT Short Term Goal 1 (Week 1): = LTGs due to anticipated LOS  Skilled Therapeutic Interventions/Progress Updates:  Pt was seen bedside in the am, sitting on edge of bed finishing breakfast. Pt performed sit to stand transfers with crutches and S. Pt ambulated about 12 feet to and from bathroom to urinate with S and verbal cues. Pt propelled w/c about 150 feet with B UEs and no assist. In gym pt performed R knee flex/ext and quad sets seated in w/c, 3 sets x 10 reps each. Pt transferred w/c to edge of mat squat pivot with S. Pt transferred edge of mat to supine with no assist. On mat, pt performed R SAQs and SLRs, 3 sets x 10 reps each. Pt transferred supine to edge of mat with no assist. Pt transferred edge of mat to w/c squat pivot with S and verbal cues. Pt propelled w/c back to room with B UEs and no assist.   Therapy Documentation Precautions:  Precautions Precautions: Fall Precaution Comments: R BKA Restrictions Weight Bearing Restrictions: Yes RLE Weight Bearing: Non weight bearing General:   Pain: Pt c/o 8/10 pain R knee and residual limb.   See Function Navigator for Current Functional Status.   Therapy/Group: Individual Therapy  Gregory May, Gregory May 06/11/2015, 10:10 AM

## 2015-06-11 NOTE — Plan of Care (Signed)
Problem: RH BOWEL ELIMINATION Goal: RH STG MANAGE BOWEL W/MEDICATION W/ASSISTANCE STG Manage Bowel with Medication with Assistance. Mod I  Outcome: Progressing Pt had large BM after sorbitol given by prior nurse

## 2015-06-11 NOTE — Progress Notes (Signed)
[facility] PHYSICAL MEDICINE & REHABILITATION     PROGRESS NOTE    Subjective/Complaints:  Still with some throbbing in right leg when it dangles. Patella still tender.   ROS:  Denies CP, SOB, N/V/D.  Objective: Vital Signs: Blood pressure 101/57, pulse 79, temperature 98 F (36.7 C), temperature source Oral, resp. rate 18, height 6' (1.829 m), weight 84.959 kg (187 lb 4.8 oz), SpO2 94 %. Dg Knee 4 Views W/patella Right  06/10/2015  CLINICAL DATA:  Acute right knee pain after fall on Monday doing physical therapy. Initial encounter. EXAM: RIGHT KNEE - COMPLETE 4+ VIEW COMPARISON:  None. FINDINGS: There is no evidence of fracture, dislocation, or joint effusion. Partial visualization of below-the-knee amputation changes. Osteopenia, likely disuse. IMPRESSION: No acute osseous finding. Electronically Signed   By: Marnee SpringJonathon  Watts M.D.   On: 06/10/2015 11:17   No results for input(s): WBC, HGB, HCT, PLT in the last 72 hours. No results for input(s): NA, K, CL, GLUCOSE, BUN, CREATININE, CALCIUM in the last 72 hours.  Invalid input(s): CO CBG (last 3)   Recent Labs  06/10/15 1636 06/10/15 2049 06/11/15 0636  GLUCAP 127* 173* 126*    Wt Readings from Last 3 Encounters:  06/08/15 84.959 kg (187 lb 4.8 oz)  06/01/15 88.86 kg (195 lb 14.4 oz)  05/12/15 86.093 kg (189 lb 12.8 oz)    Physical Exam:  BP 101/57 mmHg  Pulse 79  Temp(Src) 98 F (36.7 C) (Oral)  Resp 18  Ht 6' (1.829 m)  Wt 84.959 kg (187 lb 4.8 oz)  BMI 25.40 kg/m2  SpO2 94% Constitutional: He appears well-developed and well-nourished. NAD Head: Normocephalic and atraumatic.  Poor dentition  Eyes: Conjunctivae and EOM are normal.  Neck: Normal range of motion. Neck supple. No thyromegaly present.  Cardiovascular: Normal rate and regular rhythm.  Respiratory: Effort normal and breath sounds normal. No respiratory distress.  GI: Soft. Bowel sounds are normal. He exhibits no distension.  Musculoskeletal:  stump in coban wrap. He has pain along medial patella . Minimal bruising,  Strength b/l UE 4+/5 grossly thoughout LLE 4+/5 grossly throughout RLE hip flex 5/5, BKA  Neurological: He is alert and oriented  Follows full commands  Skin: Skin is warm and dry.  BKA site is dressed appropriately tender  Psychiatric: He has a normal mood and affect. His behavior is normal   Assessment/Plan: 1. 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 Physiatrist is providing close team supervision and 24 hour management of active medical problems listed below. Physiatrist and rehab team continue to assess barriers to discharge/monitor patient progress toward functional and medical goals.  Function:  Bathing Bathing position   Position: Shower  Bathing parts Body parts bathed by patient: Right arm, Left arm, Chest, Abdomen, Front perineal area, Buttocks, Right upper leg, Left upper leg, Right lower leg, Left lower leg Body parts bathed by helper: Back  Bathing assist Assist Level: Supervision or verbal cues      Upper Body Dressing/Undressing Upper body dressing   What is the patient wearing?: Pull over shirt/dress     Pull over shirt/dress - Perfomed by patient: Thread/unthread right sleeve, Thread/unthread left sleeve, Put head through opening, Pull shirt over trunk          Upper body assist Assist Level: Set up   Set up : To obtain clothing/put away  Lower Body Dressing/Undressing Lower  body dressing   What is the patient wearing?: Underwear, Pants, Shoes Underwear - Performed by patient: Pull underwear up/down, Thread/unthread right underwear leg, Thread/unthread left underwear leg   Pants- Performed by patient: Thread/unthread right pants leg, Thread/unthread left pants leg, Pull pants up/down, Fasten/unfasten pants       Socks - Performed  by patient:  (L n/a) Socks - Performed by helper: Don/doff right sock Shoes - Performed by patient: Don/doff right shoe            Lower body assist Assist for lower body dressing: Supervision or verbal cues      Toileting Toileting   Toileting steps completed by patient: Adjust clothing after toileting, Performs perineal hygiene, Adjust clothing prior to toileting   Toileting Assistive Devices: Other (comment), Grab bar or rail (crutches)  Toileting assist Assist level: Supervision or verbal cues   Transfers Chair/bed transfer   Chair/bed transfer method: Ambulatory Chair/bed transfer assist level: Supervision or verbal cues Chair/bed transfer assistive device: Other (crutches)     Locomotion Ambulation     Max distance: 75 Assist level: Supervision or verbal cues   Wheelchair   Type: Manual Max wheelchair distance: 150 ft Assist Level: No help, No cues, assistive device, takes more than reasonable amount of time  Cognition Comprehension Comprehension assist level: Follows complex conversation/direction with no assist  Expression Expression assist level: Expresses complex ideas: With no assist  Social Interaction Social Interaction assist level: Interacts appropriately with others with medication or extra time (anti-anxiety, antidepressant).  Problem Solving Problem solving assist level: Solves basic problems with no assist  Memory Memory assist level: Complete Independence: No helper    Medical Problem List and Plan: 1. 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 DVT Prophylaxis/Anticoagulation: Subcutaneous heparin for DVT prophylaxis. Monitor for any bleeding episodes 3. Pain Management: Neurontin 300 mg TID,Oxycodone and robaxin as needed.   -phantom pain minimal, does have incisional pain  -having more pain where he struck knee along medial patella---likely bruise     -xr of right knee without fx or effusion (pt reassured)   -will remove dressing on Monday 4. Diabetes mellitus with peripheral neuropathy. Latest hemoglobin A1c 8.2. NovoLog 10 units 3 times a day, Lantus insulin 30 units daily at bedtime, increased to Lantus 35 and Novolog 12 TID with SSI on 10/26.   -continue diabetic teaching  -cbg's showing some improvement---pattern still irregular--no changes in meds today 5. Neuropsych: This patient is capable of making decisions on his own behalf. 6. Skin/Wound Care: Routine skin checks. 7. Fluids/Electrolytes/Nutrition: Routine I&O's with follow-up chemistries   8. Hyperlipidemia. Pravachol 9. Polysubstance abuse. Counseling to continue   LOS (Days) 4 A FACE TO FACE EVALUATION WAS PERFORMED  Ellamay Fors T 06/11/2015 8:30 AM

## 2015-06-11 NOTE — Progress Notes (Signed)
Occupational Therapy Session Note  Patient Details  Name: Gregory May MRN: [id number] Date of Birth: 1961/06/05  Today's Date: 06/11/2015 OT Individual Time:  -   1100-1200   (60 min)  1st session                                          1445- 1515   (30 min)   2nd session      Short Term Goals: Week 1:  OT Short Term Goal 1 (Week 1): STG=LTG due to LOS      Skilled Therapeutic Interventions/Progress Updates:      1st session:  Pt sitting on EOB Agreed to shower.  Ambulated with crutches to shower with close supervision and transferred to shower  Bench with SBA.  Pt showered in sitting with lateral leans for peri care.  Ambulated to bed area to dress with SBA.   Pt has rash in groin area.  Notified RN.   2nd session:  Pt. Agreed to work on pain control with Right residual pain.  Instructed pt on light massage.  Pt reported pain decreased by 4 points (from 8 to 4).    Instructed pt to perform daily for pain control.      Therapy Documentation Precautions:  Precautions Precautions: Fall Precaution Comments: R BKA Restrictions Weight Bearing Restrictions: Yes RLE Weight Bearing: Non weight bearing      Pain: Pain Assessment Pain Assessment:8/10 Pain Score:Pain 8/10Type: Acute pain Pain Location: Knee Pain Orientation: Right Pain Descriptors / Indicators: Sore Pain Onset: Gradual Pain Intervention(s): Medication (See eMAR)         See Function Navigator for Current Functional Status.   Therapy/Group: Individual Therapy  Humberto Sealsdwards, Aedon Deason J 06/11/2015, 11:47 AM

## 2015-06-11 NOTE — Progress Notes (Signed)
Physical Therapy Session Note  Patient Details  Name: Gregory May MRN: [id number] Date of Birth: 09-04-1960  Today's Date: 06/11/2015 PT Individual Time: 1300-1345 PT Individual Time Calculation (min): 45 min   Short Term Goals: Week 1:  PT Short Term Goal 1 (Week 1): = LTGs due to anticipated LOS  Skilled Therapeutic Interventions/Progress Updates:  Pt was seen bedside in the pm. Pt transferred supine to edge of bed with no assist. Pt performed all sit to stand transfers with S and verbal cues with crutches. Pt ambulated 150 feet x 4 with axillary crutches and S with verbal cues. Pt performed car transfers from car height and truck height with axillary crutches and S with verbal cues. Pt ascended/descended 4 stairs x 2 with B axillary crutches and min A with verbal cues. Pt returned to room and left sitting on edge of bed with call bell within reach.    Therapy Documentation Precautions:  Precautions Precautions: Fall Precaution Comments: R BKA Restrictions Weight Bearing Restrictions: Yes RLE Weight Bearing: Non weight bearing General:   Pain: No c/o pain.   See Function Navigator for Current Functional Status.   Therapy/Group: Individual Therapy  Gregory May, Gregory May 06/11/2015, 3:36 PM

## 2015-06-12 ENCOUNTER — Inpatient Hospital Stay (HOSPITAL_COMMUNITY): Payer: Medicaid Other | Admitting: Physical Therapy

## 2015-06-12 DIAGNOSIS — S8001XS Contusion of right knee, sequela: Secondary | ICD-10-CM

## 2015-06-12 LAB — GLUCOSE, CAPILLARY
Glucose-Capillary: 173 mg/dL — ABNORMAL HIGH (ref 65–99)
Glucose-Capillary: 121 mg/dL — ABNORMAL HIGH (ref 65–99)
Glucose-Capillary: 109 mg/dL — ABNORMAL HIGH (ref 65–99)
Glucose-Capillary: 200 mg/dL — ABNORMAL HIGH (ref 65–99)
Glucose-Capillary: 219 mg/dL — ABNORMAL HIGH (ref 65–99)

## 2015-06-12 NOTE — Progress Notes (Signed)
[facility] PHYSICAL MEDICINE & REHABILITATION     PROGRESS NOTE    Subjective/Complaints:  Right knee,hip tender at times. Occasional throbbing in right stump---overall feeling better though.   ROS:  Denies CP, SOB, N/V/D.  Objective: Vital Signs: Blood pressure 106/68, pulse 82, temperature 97.8 F (36.6 C), temperature source Oral, resp. rate 18, height 6' (1.829 m), weight 84.959 kg (187 lb 4.8 oz), SpO2 96 %. Dg Knee 4 Views W/patella Right  06/10/2015  CLINICAL DATA:  Acute right knee pain after fall on Monday doing physical therapy. Initial encounter. EXAM: RIGHT KNEE - COMPLETE 4+ VIEW COMPARISON:  None. FINDINGS: There is no evidence of fracture, dislocation, or joint effusion. Partial visualization of below-the-knee amputation changes. Osteopenia, likely disuse. IMPRESSION: No acute osseous finding. Electronically Signed   By: Marnee SpringJonathon  Watts M.D.   On: 06/10/2015 11:17   No results for input(s): WBC, HGB, HCT, PLT in the last 72 hours. No results for input(s): NA, K, CL, GLUCOSE, BUN, CREATININE, CALCIUM in the last 72 hours.  Invalid input(s): CO CBG (last 3)   Recent Labs  06/11/15 1634 06/11/15 2026 06/12/15 0636  GLUCAP 192* 173* 121*    Wt Readings from Last 3 Encounters:  06/08/15 84.959 kg (187 lb 4.8 oz)  06/01/15 88.86 kg (195 lb 14.4 oz)  05/12/15 86.093 kg (189 lb 12.8 oz)    Physical Exam:  BP 106/68 mmHg  Pulse 82  Temp(Src) 97.8 F (36.6 C) (Oral)  Resp 18  Ht 6' (1.829 m)  Wt 84.959 kg (187 lb 4.8 oz)  BMI 25.40 kg/m2  SpO2 96% Constitutional: He appears well-developed and well-nourished. NAD Head: Normocephalic and atraumatic.  Poor dentition  Eyes: Conjunctivae and EOM are normal.  Neck: Normal range of motion. Neck supple. No thyromegaly present.  Cardiovascular: Normal rate and regular rhythm.  Respiratory: Effort normal and breath sounds normal. No respiratory distress.  GI: Soft. Bowel sounds are normal. He exhibits no  distension.  Musculoskeletal: stump in coban wrap. He has pain along medial patella .   Strength b/l UE 4+/5 grossly thoughout LLE 4+/5 grossly throughout RLE hip flex 4/5, RKE 3+/5 Neurological: He is alert and oriented  Follows full commands  Skin: Skin is warm and dry.  BKA site is dressed appropriately tender  Psychiatric: He has a normal mood and affect. His behavior is normal   Assessment/Plan: 1. 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 Physiatrist is providing close team supervision and 24 hour management of active medical problems listed below. Physiatrist and rehab team continue to assess barriers to discharge/monitor patient progress toward functional and medical goals.  Function:  Bathing Bathing position   Position: Shower  Bathing parts Body parts bathed by patient: Right arm, Left arm, Chest, Abdomen, Front perineal area, Buttocks, Right upper leg, Left upper leg, Left lower leg Body parts bathed by helper: Back  Bathing assist Assist Level: Supervision or verbal cues      Upper Body Dressing/Undressing Upper body dressing   What is the patient wearing?: Pull over shirt/dress     Pull over shirt/dress - Perfomed by patient: Thread/unthread right sleeve, Thread/unthread left sleeve, Put head through opening, Pull shirt over trunk          Upper body assist Assist Level: Set up   Set up : To obtain clothing/put away  Lower Body Dressing/Undressing Lower body dressing  What is the patient wearing?: Underwear, Pants, Shoes Underwear - Performed by patient: Pull underwear up/down, Thread/unthread right underwear leg, Thread/unthread left underwear leg   Pants- Performed by patient: Thread/unthread right pants leg, Thread/unthread left pants leg, Pull pants up/down, Fasten/unfasten pants       Socks - Performed  by patient: Don/doff right sock, Don/doff left sock Socks - Performed by helper: Don/doff right sock Shoes - Performed by patient: Don/doff right shoe            Lower body assist Assist for lower body dressing: Supervision or verbal cues      Toileting Toileting   Toileting steps completed by patient: Adjust clothing after toileting, Performs perineal hygiene, Adjust clothing prior to toileting   Toileting Assistive Devices: Other (comment), Grab bar or rail (crutches)  Toileting assist Assist level: Supervision or verbal cues   Transfers Chair/bed transfer   Chair/bed transfer method: Stand pivot Chair/bed transfer assist level: Supervision or verbal cues Chair/bed transfer assistive device: Other (crutches)     Locomotion Ambulation     Max distance: 150 Assist level: Supervision or verbal cues   Wheelchair   Type: Manual Max wheelchair distance: 150 Assist Level: No help, No cues, assistive device, takes more than reasonable amount of time  Cognition Comprehension Comprehension assist level: Follows complex conversation/direction with no assist  Expression Expression assist level: Expresses complex ideas: With no assist  Social Interaction Social Interaction assist level: Interacts appropriately with others with medication or extra time (anti-anxiety, antidepressant).  Problem Solving Problem solving assist level: Solves basic problems with no assist  Memory Memory assist level: Complete Independence: No helper    Medical Problem List and Plan: 1. Functional deficits secondary to Right BKA secondary to abscess ulceration 06/03/2015  -pt progressing, using crutches for ambulation   -pt would like handicapped parking pass  2. DVT Prophylaxis/Anticoagulation: Subcutaneous heparin for DVT prophylaxis. Monitor for any bleeding episodes 3. Pain Management: Neurontin 300 mg TID,Oxycodone and robaxin as needed.   -phantom pain minimal, does have incisional pain  -having  more pain where he struck knee along medial patella---likely bruise    -xr of right knee without fx or effusion (pt reassured)   -will remove dressing on Monday 4. Diabetes mellitus with peripheral neuropathy. Latest hemoglobin A1c 8.2. NovoLog 10 units 3 times a day, Lantus insulin 30 units daily at bedtime, increased to Lantus 35 and Novolog 12 TID with SSI on 10/26.   -continue diabetic teaching  -cbg's with improvement--- --no changes in meds indicated 5. Neuropsych: This patient is capable of making decisions on his own behalf. 6. Skin/Wound Care: Routine skin checks. 7. Fluids/Electrolytes/Nutrition: Routine I&O's with follow-up chemistries next week   8. Hyperlipidemia. Pravachol 9. Polysubstance abuse. Counseling to continue   LOS (Days) 5 A FACE TO FACE EVALUATION WAS PERFORMED  Emie Sommerfeld T 06/12/2015 8:57 AM

## 2015-06-12 NOTE — Progress Notes (Signed)
Physical Therapy Session Note  Patient Details  Name: Gregory May MRN: [id number] Date of Birth: [birth year]  Today's Date: 06/12/2015 PT Individual Time: 1115-1200 PT Individual Time Calculation (min): 45 min   Short Term Goals: Week 1:  PT Short Term Goal 1 (Week 1): = LTGs due to anticipated LOS  Skilled Therapeutic Interventions/Progress Updates:  Pt was seen bedside in the am. Pt transferred supine to edge of bed with no assist. Pt transferred sit to stand with S and crutches. Pt ambulated about 175 feet with B crutches and S. Pt ascended/decended 4 stairs with crutches x 3 with min A and verbal cues. Pt performed R knee flex/ext and quads sets, 3 sets x 10 reps each. Pt ambulated 175 feet with crutches and S back to room. Pt left sitting on edge of bed with call bell within reach.   Therapy Documentation Precautions:  Precautions Precautions: Fall Precaution Comments: R BKA Restrictions Weight Bearing Restrictions: Yes RLE Weight Bearing: Non weight bearing General:   Pain: Pt c/o 8/10 pain residual limb.   See Function Navigator for Current Functional Status.   Therapy/Group: Individual Therapy  [name], [name] 06/12/2015, 12:54 PM

## 2015-06-13 ENCOUNTER — Inpatient Hospital Stay (HOSPITAL_COMMUNITY): Payer: Self-pay | Admitting: Occupational Therapy

## 2015-06-13 ENCOUNTER — Inpatient Hospital Stay (HOSPITAL_COMMUNITY): Payer: Medicaid Other

## 2015-06-13 LAB — GLUCOSE, CAPILLARY
Glucose-Capillary: 143 mg/dL — ABNORMAL HIGH (ref 65–99)
Glucose-Capillary: 130 mg/dL — ABNORMAL HIGH (ref 65–99)
Glucose-Capillary: 150 mg/dL — ABNORMAL HIGH (ref 65–99)
Glucose-Capillary: 188 mg/dL — ABNORMAL HIGH (ref 65–99)

## 2015-06-13 NOTE — Progress Notes (Signed)
Occupational Therapy Session Note  Patient Details  Name: Gregory May MRN: [id number] Date of Birth: Sep 30, 1960  Today's Date: 06/13/2015 OT Individual Time: 0930-1030 and 1400-1500 OT Individual Time Calculation (min): 60 min and 60 min   Short Term Goals: Week 1:  OT Short Term Goal 1 (Week 1): STG=LTG due to LOS  Skilled Therapeutic Interventions/Progress Updates:    Session One: Pt seen for OT ADL bathing and dressing session. Pt in supine upon arrival voicing 8/10 phantom pain. RN made aware and pt agreeable to cont with therapy. He ambulated throughout room using crutches mod I. He showered at seated level, standing to complete pericare. Recommendation made for pt to complete pericare/ buttock hygiene via lateral leans for safety at d/c.  ACE wrap covered prior to shower following PA orders. When ACE changed after shower, noted bleeding from surgical site. RN made aware and administered care.  Pt dressed seated at EOB, standing with crutch to pull pants up. Education provided throughout session regarding fall risk, residual limb wrapping and d/c planning.   Session Two: Pt seen for OT therapy session focusing on caregiver training for limb wrapping. Pt in supine upon arrival with aunt present. He completed toileting task mod I ambulating throughout room with crutches. In ADL apartment, pt sat on couch to complete limb wrapping training, as he will sit on couch at home to complete task. Pt provided with inspection mirror for residual limb care. Demonstration provided for figure 8 wrapping technique. Pt and caregiver both return demonstrated wrapping with min VCs provided for technique. Pt declined to cont with practice at this time due to pain in residual limb- will cont to practice at next therapy session and made PT aware to address in their session. Wile in apartment, showed pt's aunt tub transfer bench and educated on use for d/c.  In therapy gym, pt completed mirror box LE  exercises for pain management. Education provided regarding how to set-up mirror box for home use. Pt left sitting on therapy mat at end of session with hand off to PT.  Education provided throughout session regarding residual limb wrapping, pain management, and d/c planning.    Therapy Documentation Precautions:  Precautions Precautions: Fall Precaution Comments: R BKA Restrictions Weight Bearing Restrictions: Yes RLE Weight Bearing: Non weight bearing Pain: Pain Assessment Pain Score: 8  Pain Type: Phantom pain Pain Location: Leg Pain Orientation: Right Pain Descriptors / Indicators: Aching;Burning Pain Intervention(s): RN made aware;Repositioned;Ambulation/increased activity;Shower  See Function Navigator for Current Functional Status.   Therapy/Group: Individual Therapy  Lewis, Riese Hellard C 06/13/2015, 7:11 AM

## 2015-06-13 NOTE — Discharge Summary (Signed)
NAMBevely Palmer:  Lopes, Janos           ACCOUNT NO.:  [id number]  MEDICAL RECORD NO.:  [id number]  LOCATION:  4M08C                        FACILITY:  [facility]  PHYSICIAN:  Ranelle OysterZachary T. Swartz, M.D.DATE OF BIRTH:  1961-04-03  DATE OF ADMISSION:  06/07/2015 DATE OF DISCHARGE:  06/14/2015                              DISCHARGE SUMMARY   DISCHARGE DIAGNOSES: 1. 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 2. Subcutaneous heparin for DVT prophylaxis. 3. Pain management. 4. Diabetes mellitus, peripheral neuropathy. 5. Hyperlipidemia. 6. Polysubstance abuse.  HISTORY OF PRESENT ILLNESS:  This is a 54 year old right-handed male with history of diabetes mellitus, multiple joint surgeries, pulmonary emboli with left DVT, maintained on Eliquis, completed in February, 2016; lives with his brother, one-level home.  Independent with crutches prior to admission.  He presented June 01, 2015 with right foot ulceration swelling.  He was initially treated at a wound care center with conservative care.  A urine drug screen was positive for cocaine. X-rays of right foot suggestive of osteomyelitis.  Limb was not felt to be salvageable.  He underwent right below-knee amputation on June 03, 2015 per Dr. Lajoyce Cornersuda.  Hospital course, pain management.  Subcutaneous heparin for DVT prophylaxis.  Physical therapy evaluations completed. The patient was admitted for a comprehensive rehab program.  PAST MEDICAL HISTORY:  See discharge diagnoses.  SOCIAL HISTORY:  Lives with brother, independent with crutches prior to admission.  Functional status upon admission to Rehab Services was minimal assist to ambulate with crutches; supervision, supine to sit; min to mod assist, activities of daily living.  PHYSICAL EXAMINATION:  VITAL SIGNS:  Blood pressure 132/82, pulse 102, temperature 98, respirations 18. GENERAL:  This was an alert male, in no acute  distress, oriented x3. HEENT:  Poor dentition. LUNGS:  Clear to auscultation.  No respiratory distress. CARDIOVASCULAR:  Regular rate and rhythm without murmur. ABDOMEN:  Soft, nontender.  Good bowel sounds. EXTREMITIES:  Below-knee amputation site was dressed, appropriately tender.  REHABILITATION HOSPITAL COURSE:  The patient was admitted to Inpatient Rehab Services with therapies initiated on a 3-hour daily basis consisting of physical therapy, occupational therapy, and rehabilitation nursing.  The following issues were addressed during the patient's rehabilitation stay.  Pertaining to Mr. Dooling's right below-knee amputation, surgical site healing nicely.  He would follow up with Dr. Lajoyce Cornersuda.  Pain management with the use of Neurontin 3 times daily, oxycodone and Robaxin for breakthrough pain with good results.  He was having some knee pain along the medial patella.  X-rays without fracture or effusion.  Diabetes mellitus, peripheral neuropathy.  Hemoglobin A1c of 8.2.  He remained on insulin therapy as advised with diabetic teaching and would follow up with his primary physician.  Blood pressure is well controlled without the use of antihypertensive medication. Noted urine drug screen upon admission, positive for cocaine.  The patient received full counseling in regard to cessation of illicit drug products.  It was questionable if he would be compliant with these request.  The patient received weekly collaborative interdisciplinary team conferences to discuss estimated length of stay, family teaching, and any barriers to discharge.  Transferred supine to edge of bed  without assistance.  Transferred sit to stand, supervision with crutches.  Ambulated 175 feet with bilateral crutches and supervision. Up and down stairs with crutches x3 minimal assistance and verbal cues. He was able to gather his belongings for activities of daily living and homemaking.  Showered in a sitting  position.  Ambulated to the bed to dress with standby assistance.  Full family teaching was completed and discharged to home with ongoing therapies dictated per [employer].  DISCHARGE MEDICATIONS: 1. Neurontin 300 mg p.o. t.i.d. 2. Lantus insulin 35 units at bedtime. 3. Robaxin 500 mg every 6 hours as needed for muscle spasms. 4. Oxycodone immediate release 5-10 mg every 3 hours as needed for     pain, dispense of 90 tablets. 5. Pravachol 40 mg p.o. daily.  DIET:  Diabetic diet.  PLAN:  The patient would follow up with Dr. Faith Rogue at the outpatient rehab service office as directed; Dr. Aldean Baker, 2 weeks call for appointment; Dr. Johnny Bridge, November 8th, 2016, medical management.     Mariam Dollar, P.A.   ______________________________ Ranelle Oyster, M.D.    DA/MEDQ  D:  06/13/2015  T:  06/13/2015  Job:  [id number]  cc:   Nadara Mustard, MD Dr. Deneise Lever

## 2015-06-13 NOTE — Progress Notes (Signed)
Occupational Therapy Discharge Summary  Patient Details  Name: Gregory May MRN: [id number] Date of Birth: April 22, 1961   Patient has met 10 of 10 long term goals due to improved activity tolerance, improved balance, postural control, ability to compensate for deficits and improved coordination.  Patient to discharge at overall Modified Independent level.  Patient's care partner is independent to provide the necessary physical assistance at discharge.     Recommendation:  Patient with no further OT needs at this time  Equipment: Tub transfer bench  Reasons for discharge: treatment goals met and discharge from hospital  Patient/family agrees with progress made and goals achieved: Yes  OT Discharge Precautions/Restrictions  Precautions Precautions: Fall Precaution Comments: R BKA Restrictions Weight Bearing Restrictions: Yes RLE Weight Bearing: Non weight bearing Vision/Perception  Vision- History Baseline Vision/History: No visual deficits;Wears glasses Wears Glasses: At all times Patient Visual Report: No change from baseline  Cognition Overall Cognitive Status: Within Functional Limits for tasks assessed Arousal/Alertness: Awake/alert Orientation Level: Oriented X4 Memory: Appears intact Awareness: Appears intact Problem Solving: Appears intact Safety/Judgment: Appears intact Sensation Sensation Light Touch: Appears Intact Proprioception: Appears Intact Coordination Gross Motor Movements are Fluid and Coordinated: Yes Fine Motor Movements are Fluid and Coordinated: Yes Motor  Motor Motor: Within Functional Limits Trunk/Postural Assessment  Cervical Assessment Cervical Assessment: Exceptions to [facility] (Forward head) Thoracic Assessment Thoracic Assessment: Exceptions to [facility] (Thoracic kyphosis) Lumbar Assessment Lumbar Assessment: Within Functional Limits Postural Control Postural Control: Deficits on evaluation Protective Responses: Impaired due to pain   Balance Balance Balance Assessed: Yes Static Sitting Balance Static Sitting - Balance Support: Feet supported Static Sitting - Level of Assistance: 6: Modified independent (Device/Increase time) Dynamic Sitting Balance Dynamic Sitting - Balance Support: Feet supported Dynamic Sitting - Level of Assistance: 6: Modified independent (Device/Increase time) Dynamic Sitting - Balance Activities: Lateral lean/weight shifting;Forward lean/weight shifting;Reaching for objects;Reaching across midline;Trunk control activities Sitting balance - Comments: Sitting to complete bathing task Static Standing Balance Static Standing - Balance Support: Right upper extremity supported;Left upper extremity supported;During functional activity Static Standing - Level of Assistance: 6: Modified independent (Device/Increase time) Static Standing - Comment/# of Minutes: Standing to complete grooming task Dynamic Standing Balance Dynamic Standing - Balance Support: During functional activity;Left upper extremity supported (Using crutch) Dynamic Standing - Level of Assistance: 6: Modified independent (Device/Increase time) Dynamic Standing - Balance Activities: Lateral lean/weight shifting;Reaching for weighted objects;Forward lean/weight shifting;Reaching for objects;Reaching across midline Extremity/Trunk Assessment RUE Assessment RUE Assessment: Exceptions to [facility] RUE AROM (degrees) Overall AROM Right Upper Extremity: Within functional limits for tasks performed RUE Strength RUE Overall Strength: Deficits (3+/5 shoulder flexion; 4/5 shoulder extension; 4+/5 elbow flexion/ext,) LUE Assessment LUE Assessment: Within Functional Limits   See Function Navigator for Current Functional Status.  Lewis, Sharni Negron C 06/13/2015, 7:23 AM

## 2015-06-13 NOTE — Progress Notes (Signed)
Physical Therapy Session Note  Patient Details  Name: Gregory May MRN: [id number] Date of Birth: [birth year]  Today's Date: 06/13/2015 PT Individual Time: 0800-0900 PT Individual Time Calculation (min): 60 min   Short Term Goals: Week 1:  PT Short Term Goal 1 (Week 1): = LTGs due to anticipated LOS  Skilled Therapeutic Interventions/Progress Updates:   Session focused on addressing functional transfers and mobility to prepare for discharge tomorrow, simulated car transfer with crutches at modified independent level, stair negotiation for home entry with crutches x 8 (6" steps) at supervision level for safety but no cues needed, education on how to perform mirror therapy at home in both seated or long sitting position, low couch for bed mobility to simulate home environment at modified independent, reviewed what to do in case of fall at home and practiced floor transfers at supervision level, and gait on unit for functional mobility training using crutches > 150' over tiled, compliant, and carpeted surfaces to simulate home and community environment. Pt made modified independent in room using crutches.   Therapy Documentation Precautions:  Precautions Precautions: Fall Precaution Comments: R BKA Restrictions Weight Bearing Restrictions: Yes RLE Weight Bearing: Non weight bearing  Pain: C/o pain in residual limb - premedicated.     See Function Navigator for Current Functional Status.   Therapy/Group: Individual Therapy  Karolee StampsGray, Gregory May  Gregory May, PT, DPT  06/13/2015, 10:20 AM

## 2015-06-13 NOTE — IPOC Note (Signed)
Overall Plan of Care [facility](IPOC) Patient Details Name: Gregory MornVergil T May MRN: [id number] DOB: September 18, 1960  Admitting Diagnosis: bka  Hospital Problems: Active Problems:   Diabetes mellitus type 2, controlled, with complications (HCC)   Status post below knee amputation of right lower extremity (HCC)     Functional Problem List: Nursing Pain, Skin Integrity  PT Balance, Edema, Endurance, Motor, Pain, Safety, Sensory  OT Balance, Pain, Endurance, Safety  SLP    TR         Basic ADL's: OT Bathing, Dressing, Toileting     Advanced  ADL's: OT Simple Meal Preparation, Laundry, Light Housekeeping     Transfers: PT Bed Mobility, Bed to Chair, Car, State Street CorporationFurniture, Civil Service fast streamerloor  OT Toilet, Research scientist (life sciences)Tub/Shower     Locomotion: PT Ambulation, Psychologist, prison and probation servicesWheelchair Mobility, Stairs     Additional Impairments: OT    SLP        TR      Anticipated Outcomes Item Anticipated Outcome  Self Feeding Independent  Swallowing      Basic self-care  Mod I  Toileting  Mod I   Bathroom Transfers Supervision  Bowel/Bladder  Mod I  Transfers  mod I  Locomotion  mod I ambulator  Communication     Cognition     Pain  <4  Safety/Judgment  Supervision   Therapy Plan: PT Intensity: Minimum of 1-2 x/day ,45 to 90 minutes PT Frequency: 5 out of 7 days PT Duration Estimated Length of Stay: 5-7 days OT Intensity: Minimum of 1-2 x/day, 45 to 90 minutes OT Frequency: 5 out of 7 days OT Duration/Estimated Length of Stay: 7 days         Team Interventions: Nursing Interventions Patient/Family Education, Disease Management/Prevention, Pain Management, Skin Care/Wound Management  PT interventions Ambulation/gait training, Warden/rangerBalance/vestibular training, Community reintegration, Discharge planning, Disease management/prevention, Fish farm managerDME/adaptive equipment instruction, Functional mobility training, Neuromuscular re-education, Pain management, Patient/family education, Psychosocial support, Stair training, Therapeutic  Activities, Therapeutic Exercise, UE/LE Coordination activities, UE/LE Strength taining/ROM, Wheelchair propulsion/positioning  OT Interventions Warden/rangerBalance/vestibular training, Discharge planning, Community reintegration, Fish farm managerDME/adaptive equipment instruction, Functional mobility training, Patient/family education, UE/LE Strength taining/ROM, UE/LE Coordination activities, Wheelchair propulsion/positioning, Therapeutic Exercise, Therapeutic Activities  SLP Interventions    TR Interventions    SW/CM Interventions Discharge Planning, Facilities managersychosocial Support, Patient/Family Education    Team Discharge Planning: Destination: PT-Home ,OT- Home , SLP-  Projected Follow-up: PT-Home health PT, OT-  None, SLP-  Projected Equipment Needs: PT-None recommended by PT, OT- Tub/shower bench, To be determined, SLP-  Equipment Details: PT-patient has crutches, OT-  Patient/family involved in discharge planning: PT- Patient,  OT-Patient, SLP-   MD ELOS: 6 days Medical Rehab Prognosis:  Excellent Assessment: The patient has been admitted for CIR therapies with the diagnosis of right BKA. The team will be addressing functional mobility, strength, stamina, balance, safety, adaptive techniques and equipment, self-care, bowel and bladder mgt, patient and caregiver education, fall prevention, pre-prosthetic education, cruth use and safety, pain control. Goals have been set at mod I for mobility and self-care.    Ranelle OysterZachary T. Carlton Sweaney, MD, FAAPMR      See Team Conference Notes for weekly updates to the plan of care

## 2015-06-13 NOTE — Progress Notes (Signed)
[place] PHYSICAL MEDICINE & REHABILITATION     PROGRESS NOTE    Subjective/Complaints:  Right leg, knee sore. Really no worse. Moving well in room with therapy when i entered. In good spirits   ROS:  Denies CP, SOB, N/V/D.  Objective: Vital Signs: Blood pressure 115/72, pulse 80, temperature 98.7 F (37.1 C), temperature source Oral, resp. rate 18, height 6' (1.829 m), weight 84.959 kg (187 lb 4.8 oz), SpO2 95 %. No results found. No results for input(s): WBC, HGB, HCT, PLT in the last 72 hours. No results for input(s): NA, K, CL, GLUCOSE, BUN, CREATININE, CALCIUM in the last 72 hours.  Invalid input(s): CO CBG (last 3)   Recent Labs  06/12/15 1644 06/12/15 2131 06/13/15 0650  GLUCAP 200* 219* 143*    Wt Readings from Last 3 Encounters:  06/08/15 84.959 kg (187 lb 4.8 oz)  06/01/15 88.86 kg (195 lb 14.4 oz)  05/12/15 86.093 kg (189 lb 12.8 oz)    Physical Exam:  BP 115/72 mmHg  Pulse 80  Temp(Src) 98.7 F (37.1 C) (Oral)  Resp 18  Ht 6' (1.829 m)  Wt 84.959 kg (187 lb 4.8 oz)  BMI 25.40 kg/m2  SpO2 95% Constitutional: He appears well-developed and well-nourished. NAD Head: Normocephalic and atraumatic.  Poor dentition  Eyes: Conjunctivae and EOM are normal.  Neck: Normal range of motion. Neck supple. No thyromegaly present.  Cardiovascular: Normal rate and regular rhythm.  Respiratory: Effort normal and breath sounds normal. No respiratory distress.  GI: Soft. Bowel sounds are normal. He exhibits no distension.  Musculoskeletal: stump in coban wrap. He has pain along medial patella .   Strength b/l UE 4+/5 grossly thoughout LLE 4+/5 grossly throughout RLE hip flex 4/5, RKE 3+/5 Neurological: He is alert and oriented  Follows full commands  Skin: Skin is warm and dry.  BKA site is dry and well approximated. No active drainage. Old blood/scab along incision  Psychiatric: He has a normal mood and affect. His behavior is  normal   Assessment/Plan: 1. 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 Physiatrist is providing close team supervision and 24 hour management of active medical problems listed below. Physiatrist and rehab team continue to assess barriers to discharge/monitor patient progress toward functional and medical goals.  Function:  Bathing Bathing position   Position: Shower  Bathing parts Body parts bathed by patient: Right arm, Left arm, Chest, Abdomen, Front perineal area, Buttocks, Right upper leg, Left upper leg, Left lower leg Body parts bathed by helper: Back  Bathing assist Assist Level: Supervision or verbal cues      Upper Body Dressing/Undressing Upper body dressing   What is the patient wearing?: Pull over shirt/dress     Pull over shirt/dress - Perfomed by patient: Thread/unthread right sleeve, Thread/unthread left sleeve, Put head through opening, Pull shirt over trunk          Upper body assist Assist Level: Set up   Set up : To obtain clothing/put away  Lower Body Dressing/Undressing Lower body dressing   What is the patient wearing?: Underwear, Pants, Shoes Underwear - Performed by patient: Pull underwear up/down, Thread/unthread right underwear leg, Thread/unthread left underwear leg   Pants- Performed by patient: Thread/unthread right pants leg, Thread/unthread left pants leg, Pull pants up/down, Fasten/unfasten pants       Socks - Performed by patient: Don/doff right sock, Don/doff left  sock Socks - Performed by helper: Don/doff right sock Shoes - Performed by patient: Don/doff right shoe            Lower body assist Assist for lower body dressing: Supervision or verbal cues      Toileting Toileting   Toileting steps completed by patient: Adjust clothing after toileting, Performs perineal hygiene, Adjust  clothing prior to toileting   Toileting Assistive Devices: Other (comment), Grab bar or rail (crutches)  Toileting assist Assist level: Supervision or verbal cues   Transfers Chair/bed transfer   Chair/bed transfer method: Ambulatory, Stand pivot Chair/bed transfer assist level: Supervision or verbal cues Chair/bed transfer assistive device: Other (crutches)     Locomotion Ambulation     Max distance: 175 Assist level: Supervision or verbal cues   Wheelchair   Type: Manual Max wheelchair distance: 150 Assist Level: No help, No cues, assistive device, takes more than reasonable amount of time  Cognition Comprehension Comprehension assist level: Follows complex conversation/direction with no assist  Expression Expression assist level: Expresses complex ideas: With no assist  Social Interaction Social Interaction assist level: Interacts appropriately with others with medication or extra time (anti-anxiety, antidepressant).  Problem Solving Problem solving assist level: Solves complex problems: Recognizes & self-corrects  Memory Memory assist level: Complete Independence: No helper    Medical Problem List and Plan: 1. Functional deficits secondary to Right BKA secondary to abscess ulceration 06/03/2015  -pt progressing, using crutches for ambulation   - handicapped parking pass at dc 2. DVT Prophylaxis/Anticoagulation: Subcutaneous heparin for DVT prophylaxis. Monitor for any bleeding episodes 3. Pain Management: Neurontin 300 mg TID,Oxycodone and robaxin as needed.   -phantom pain minimal, does have incisional pain  -having more pain where he struck knee along medial patella---likely bruise    -xr of right knee without fx or effusion (pt reassured)    4. Diabetes mellitus with peripheral neuropathy. Latest hemoglobin A1c 8.2. NovoLog 10 units 3 times a day, Lantus insulin 30 units daily at bedtime, increased to Lantus 35 and Novolog 12 TID with SSI on 10/26.   -continue  diabetic teaching  -cbg's with improvement--- --no changes in meds indicated 5. Neuropsych: This patient is capable of making decisions on his own behalf. 6. Skin/Wound Care: post-op dressing removed today. Kerlix/ACE placed. Education on wrapping by RN 7. Fluids/Electrolytes/Nutrition: Routine I&O's with follow-up chemistries next week   8. Hyperlipidemia. Pravachol 9. Polysubstance abuse. Counseling to continue   LOS (Days) 6 A FACE TO FACE EVALUATION WAS PERFORMED  Dicie Edelen T 06/13/2015 8:50 AM

## 2015-06-13 NOTE — Discharge Summary (Signed)
Discharge summary job [id number]

## 2015-06-13 NOTE — Progress Notes (Signed)
Physical Therapy Session Note  Patient Details  Name: Gregory May MRN: [id number] Date of Birth: 11-30-1960  Today's Date: 06/13/2015 PT Individual Time: 1500-1530 PT Individual Time Calculation (min): 30 min   Short Term Goals: Week 1:  PT Short Term Goal 1 (Week 1): = LTGs due to anticipated LOS  Skilled Therapeutic Interventions/Progress Updates:    Session focused on addressing community mobility outdoors over uneven surfaces including on grass, brick, pavement, on/off elevator, navigating through doorways and up stairs outside at overall supervision to modified independent level. Performed family education with pt's aunt, whom pt will be staying with upon d/c in regards to residual limb wrapping (reviewed already with OT in previous session) with successful return demonstration. Pt making great progress.   Therapy Documentation Precautions:  Precautions Precautions: Fall Precaution Comments: R BKA Restrictions Weight Bearing Restrictions: Yes RLE Weight Bearing: Non weight bearing  Pain: C/o pain in residual limb - repositioned and rest breaks as needed.  See Function Navigator for Current Functional Status.   Therapy/Group: Individual Therapy  Karolee StampsGray, [name]  Dorethea Strubel B. Kendre Jacinto, PT, DPT  06/13/2015, 3:51 PM

## 2015-06-13 NOTE — Progress Notes (Signed)
Physical Therapy Discharge Summary  Patient Details  Name: Gregory May MRN: [id number] Date of Birth: Jun 18, 1961  Patient has met 9 of 9 long term goals due to improved activity tolerance, improved balance, increased strength, decreased pain and ability to compensate for deficits.  Patient to discharge at an ambulatory level Modified Independentusing crutches.   Patient's aunt is available to provide supervision as needed upon discharge.  Reasons goals not met: n/a - all goals met at this time  Recommendation:  Patient will benefit from ongoing skilled PT services in outpatient setting to continue to advance safe functional mobility, address ongoing impairments in ROM, strength, balance, endurance, pre-prosthetic training, and minimize fall risk but does not qualify due insurance at this time. HEP issued for patient and mod I for this.  Equipment: No equipment provided. Pt already owns crutches. Declines need for w/c at this time.  Reasons for discharge: treatment goals met and discharge from hospital  Patient/family agrees with progress made and goals achieved: Yes  PT Discharge Precautions/Restrictions Precautions Precautions: Fall Precaution Comments: R BKA Restrictions Weight Bearing Restrictions: Yes RLE Weight Bearing: Non weight bearing Cognition Overall Cognitive Status: Within Functional Limits for tasks assessed Arousal/Alertness: Awake/alert Orientation Level: Oriented X4 Memory: Appears intact Awareness: Appears intact Problem Solving: Appears intact Safety/Judgment: Appears intact Sensation Sensation Light Touch: Impaired Detail (RLE; phantom pain and sensations) Light Touch Impaired Details: Impaired RLE Proprioception: Appears Intact Coordination Gross Motor Movements are Fluid and Coordinated: Yes Fine Motor Movements are Fluid and Coordinated: Yes Motor  Motor Motor: Within Functional Limits  Trunk/Postural Assessment  Cervical  Assessment Cervical Assessment: Within Functional Limits Thoracic Assessment Thoracic Assessment: Within Functional Limits (slightly flexed posture) Lumbar Assessment Lumbar Assessment: Within Functional Limits Postural Control Postural Control: Within Functional Limits Protective Responses: Impaired due to pain  Balance Balance Balance Assessed: Yes Static Sitting Balance Static Sitting - Level of Assistance: 6: Modified independent (Device/Increase time) Dynamic Sitting Balance Dynamic Sitting - Level of Assistance: 6: Modified independent (Device/Increase time) Static Standing Balance Static Standing - Level of Assistance: 6: Modified independent (Device/Increase time) (using crutches) Dynamic Standing Balance Dynamic Standing - Balance Support: During functional activity;Left upper extremity supported (Using crutch) Dynamic Standing - Level of Assistance: 6: Modified independent (Device/Increase time) (using crutches)  Extremity Assessment      RLE Assessment RLE Assessment: Exceptions to [facility] RLE Strength RLE Overall Strength Comments: hip 4/5; knee 3/5 LLE Assessment LLE Assessment: Within Functional Limits   See Function Navigator for Current Functional Status.  Canary Brim Ivory Broad, PT, DPT  06/14/2015, 1:38 PM

## 2015-06-14 ENCOUNTER — Inpatient Hospital Stay (HOSPITAL_COMMUNITY): Payer: Medicaid Other

## 2015-06-14 ENCOUNTER — Inpatient Hospital Stay (HOSPITAL_COMMUNITY): Payer: Self-pay | Admitting: Physical Therapy

## 2015-06-14 ENCOUNTER — Inpatient Hospital Stay (HOSPITAL_COMMUNITY): Payer: Self-pay | Admitting: Occupational Therapy

## 2015-06-14 LAB — GLUCOSE, CAPILLARY
Glucose-Capillary: 138 mg/dL — ABNORMAL HIGH (ref 65–99)
Glucose-Capillary: 171 mg/dL — ABNORMAL HIGH (ref 65–99)
Glucose-Capillary: 192 mg/dL — ABNORMAL HIGH (ref 65–99)

## 2015-06-14 MED ORDER — IBUPROFEN 600 MG PO TABS
600.0000 mg | ORAL_TABLET | Freq: Once | ORAL | Status: AC
  Administered 2015-06-14: 600 mg via ORAL
  Filled 2015-06-14: qty 1

## 2015-06-14 MED ORDER — VITAMIN D 1000 UNITS PO TABS: 1000.0000 [IU] | ORAL_TABLET | Freq: Every day | ORAL | Status: DC

## 2015-06-14 MED ORDER — GABAPENTIN 300 MG PO CAPS: 300.0000 mg | ORAL_CAPSULE | Freq: Three times a day (TID) | ORAL | Status: DC

## 2015-06-14 MED ORDER — OXYCODONE HCL 5 MG PO TABS: 5.0000 mg | ORAL_TABLET | ORAL | Status: DC | PRN

## 2015-06-14 MED ORDER — METHOCARBAMOL 500 MG PO TABS: 500.0000 mg | ORAL_TABLET | Freq: Four times a day (QID) | ORAL | Status: DC | PRN

## 2015-06-14 MED ORDER — INSULIN ASPART 100 UNIT/ML ~~LOC~~ SOLN: 12.0000 [IU] | Freq: Three times a day (TID) | SUBCUTANEOUS | Status: DC

## 2015-06-14 MED ORDER — PRAVASTATIN SODIUM 40 MG PO TABS: 40.0000 mg | ORAL_TABLET | Freq: Every day | ORAL | Status: DC

## 2015-06-14 NOTE — Patient Care Conference (Signed)
Inpatient RehabilitationTeam Conference and Plan of Care Update Date: 06/14/2015   Time: 10:00 AM    Patient Name: Gregory May      Medical Record Number: [id number]  Date of Birth: [birth year] Sex: Male         Room/Bed: 4M08C/4M08C-01 Payor Info: Payor: MEDICAID PENDING / Plan: MEDICAID PENDING / Product Type: *No Product type* /    Admitting Diagnosis: bka  Admit Date/Time:  06/07/2015  2:46 PM Admission Comments: No comment available   Primary Diagnosis:  <principal problem not specified> Principal Problem: <principal problem not specified>  Patient Active Problem List   Diagnosis Date Noted  . Status post below knee amputation of right lower extremity ([place]) 06/07/2015  . Infection of right foot 06/01/2015  . Osteomyelitis ([place]) 06/01/2015  . Diabetic foot infection ([place]) 06/01/2015  . Diabetic polyneuropathy associated with type 2 diabetes mellitus ([place]) 12/09/2014  . Numbness and tingling in right hand 12/09/2014  . DJD (degenerative joint disease) of knee 07/21/2014  . Osteoarthritis, hip, bilateral 05/25/2014  . Pulmonary emboli ([place]) 02/08/2014  . DVT (deep venous thrombosis) ([place]) 02/08/2014  . Onychomycosis 10/14/2013  . Pure hypercholesterolemia 09/25/2013  . Diabetes mellitus type 2, controlled, with complications ([place]) 97/09/6376  . Diabetic foot ulcer ([place]) 09/15/2013    Expected Discharge Date: Expected Discharge Date: 06/14/15  Team Members Present: Physician leading conference: Dr. Alger Simons Social Worker Present: Alfonse Alpers, LCSW Nurse Present: Dorthula Nettles, RN PT Present: Canary Brim, PT OT Present: Napoleon Form, OT     Current Status/Progress Goal Weekly Team Focus  Medical   right bka. healing nicely. learningto wrap limb. patella bruise with pain, sugars under control.  improve funcitonal mobility  pain, wound care, dm mgt   Bowel/Bladder   Continent of bowel and bladder; LBM 06/12/15  Remain continent of bowel and bladder  Educate  need for stool softeners and laxatives r/t pain medication   Swallow/Nutrition/ Hydration             ADL's   Mod I overall  Mod I overall  d/c- goals met   Mobility   mod I overall; supervision stairs  mod I overall; supervision stairs  d/c - goals met   Communication             Safety/Cognition/ Behavioral Observations            Pain   Pt. c/o phantom pain to R BKA, 56m Robaxin q 6hr prn, 5-134mOxy IR q 3hr prn  <4 on a 0-10 pain scale  assess pain q 4hr and prn   Skin   Rash to groin, applied MGP, staples to R BKA incision  Remain free from breakdown and infection while on rehab  assess skin q shift and prn    Rehab Goals Patient on target to meet rehab goals: Yes *See Care Plan and progress notes for long and short-term goals.  Barriers to Discharge: none    Possible Resolutions to Barriers:  n/a    Discharge Planning/Teaching Needs:  Home with brother who can provide intermittent support. Aunt also supportive. Teaching completed yesterday with aunt.   Team Discussion:  Pt has met mod i goals and ready for d/c home today.  All education completed with aunt.  Revisions to Treatment Plan:  None   Continued Need for Acute Rehabilitation Level of Care: The patient requires daily medical management by a physician with specialized training in physical medicine and rehabilitation for the following conditions: Daily direction of  a multidisciplinary physical rehabilitation program to ensure safe treatment while eliciting the highest outcome that is of practical value to the patient.: Yes Daily medical management of patient stability for increased activity during participation in an intensive rehabilitation regime.: Yes Daily analysis of laboratory values and/or radiology reports with any subsequent need for medication adjustment of medical intervention for : Post surgical problems;Neurological problems  Gregory May 06/15/2015, 9:32 AM

## 2015-06-14 NOTE — Progress Notes (Signed)
[facility] PHYSICAL MEDICINE & REHABILITATION     PROGRESS NOTE    Subjective/Complaints:  Right leg continues to be sore. Likes having ACE on for support. Mild drainage from wound.  ROS:  Denies CP, SOB, N/V/D.  Objective: Vital Signs: Blood pressure 112/67, pulse 73, temperature 99.1 F (37.3 C), temperature source Oral, resp. rate 18, height 6' (1.829 m), weight 84.959 kg (187 lb 4.8 oz), SpO2 96 %. No results found. No results for input(s): WBC, HGB, HCT, PLT in the last 72 hours. No results for input(s): NA, K, CL, GLUCOSE, BUN, CREATININE, CALCIUM in the last 72 hours.  Invalid input(s): CO CBG (last 3)   Recent Labs  06/13/15 1630 06/13/15 2100 06/14/15 0648  GLUCAP 150* 188* 138*    Wt Readings from Last 3 Encounters:  06/08/15 84.959 kg (187 lb 4.8 oz)  06/01/15 88.86 kg (195 lb 14.4 oz)  05/12/15 86.093 kg (189 lb 12.8 oz)    Physical Exam:  BP 112/67 mmHg  Pulse 73  Temp(Src) 99.1 F (37.3 C) (Oral)  Resp 18  Ht 6' (1.829 m)  Wt 84.959 kg (187 lb 4.8 oz)  BMI 25.40 kg/m2  SpO2 96% Constitutional: He appears well-developed and well-nourished. NAD Head: Normocephalic and atraumatic.  Poor dentition  Eyes: Conjunctivae and EOM are normal.  Neck: Normal range of motion. Neck supple. No thyromegaly present.  Cardiovascular: Normal rate and regular rhythm.  Respiratory: Effort normal and breath sounds normal. No respiratory distress.  GI: Soft. Bowel sounds are normal. He exhibits no distension.  Musculoskeletal: stump in coban wrap. He has pain along medial patella .   Strength b/l UE 4+/5 grossly thoughout LLE 4+/5 grossly throughout RLE hip flex 4/5, RKE 3+/5 Neurological: He is alert and oriented  Follows full commands  Skin: Skin is warm and dry.  BKA site is dry only minimal drainage laterally/medially along incision  Psychiatric: He has a normal mood and affect. His behavior is normal   Assessment/Plan: 1. Functional deficits  secondary to right BKA secondary to abscess ulceration 06/03/2015 which require 3+ hours per day of interdisciplinary therapy in a comprehensive inpatient rehab setting. Physiatrist is providing close team supervision and 24 hour management of active medical problems listed below. Physiatrist and rehab team continue to assess barriers to discharge/monitor patient progress toward functional and medical goals.  Function:  Bathing Bathing position   Position: Shower  Bathing parts Body parts bathed by patient: Right arm, Left arm, Chest, Abdomen, Front perineal area, Buttocks, Right upper leg, Left upper leg, Left lower leg, Back Body parts bathed by helper: Back  Bathing assist Assist Level: More than reasonable time      Upper Body Dressing/Undressing Upper body dressing   What is the patient wearing?: Pull over shirt/dress     Pull over shirt/dress - Perfomed by patient: Thread/unthread right sleeve, Thread/unthread left sleeve, Put head through opening, Pull shirt over trunk          Upper body assist Assist Level: No help, No cues   Set up : To obtain clothing/put away  Lower Body Dressing/Undressing Lower body dressing   What is the patient wearing?: Underwear, Pants, Shoes Underwear - Performed by patient: Pull underwear up/down, Thread/unthread right underwear leg, Thread/unthread left underwear leg   Pants- Performed by patient: Thread/unthread right pants leg, Thread/unthread left pants leg, Pull pants up/down, Fasten/unfasten pants   Non-skid slipper socks- Performed by patient: Don/doff left sock   Socks - Performed by patient: Don/doff right sock,  Don/doff left sock Socks - Performed by helper: Don/doff right sock Shoes - Performed by patient: Don/doff right shoe            Lower body assist Assist for lower body dressing: More than reasonable time      Toileting Toileting   Toileting steps completed by patient: Adjust clothing after toileting, Performs  perineal hygiene, Adjust clothing prior to toileting   Toileting Assistive Devices: Grab bar or rail (crutches)  Toileting assist Assist level: More than reasonable time   Transfers Chair/bed transfer   Chair/bed transfer method: Ambulatory, Stand pivot Chair/bed transfer assist level: No Help, no cues, assistive device, takes more than a reasonable amount of time Chair/bed transfer assistive device: Other (crutches)     Locomotion Ambulation     Max distance: 175 Assist level: No help, No cues, assistive device, takes more than a reasonable amount of time   Wheelchair   Type: Manual Max wheelchair distance: 150 Assist Level: No help, No cues, assistive device, takes more than reasonable amount of time  Cognition Comprehension Comprehension assist level: Follows complex conversation/direction with no assist  Expression Expression assist level: Expresses complex ideas: With no assist  Social Interaction Social Interaction assist level: Interacts appropriately with others with medication or extra time (anti-anxiety, antidepressant).  Problem Solving Problem solving assist level: Solves complex problems: Recognizes & self-corrects  Memory Memory assist level: Complete Independence: No helper    Medical Problem List and Plan: 1. Functional deficits secondary to Right BKA secondary to abscess ulceration 06/03/2015  -pt progressing, using crutches for ambulation   - handicapped parking pass at dc 2. DVT Prophylaxis/Anticoagulation: Subcutaneous heparin for DVT prophylaxis. Monitor for any bleeding episodes 3. Pain Management: Neurontin 300 mg TID,Oxycodone and robaxin as needed.   -persistent stump/ incisional pain  -xr of right knee without fx or effusion (pt reassured)    4. Diabetes mellitus with peripheral neuropathy. Latest hemoglobin A1c 8.2. NovoLog 10 units 3 times a day, Lantus insulin 30 units daily at bedtime, increased to Lantus 35 and Novolog 12 TID with SSI on 10/26.    -continue diabetic teaching  -cbg's improved---follow up with primary 5. Neuropsych: This patient is capable of making decisions on his own behalf. 6. Skin/Wound Care: post-op dressing removed today. Kerlix/ACE placed. Education on wrapping by RN 7. Fluids/Electrolytes/Nutrition:good po   8. Hyperlipidemia. Pravachol 9. Polysubstance abuse. Counseling to continue   LOS (Days) 7 A FACE TO FACE EVALUATION WAS PERFORMED  Layton Naves T 06/14/2015 8:47 AM

## 2015-06-14 NOTE — Progress Notes (Signed)
Physical Therapy Session Note  Patient Details  Name: Hoy MornVergil T Mcanelly MRN: [id number] Date of Birth: 04-Dec-1960  Today's Date: 06/14/2015 PT Individual Time: 1123-1200 PT Individual Time Calculation (min): 37 min   Short Term Goals: Week 1:  PT Short Term Goal 1 (Week 1): = LTGs due to anticipated LOS  Skilled Therapeutic Interventions/Progress Updates:   Pt received EOB after PA finalized D/C paperwork and f/u visits.  Pt agreeable to therapy session and requesting to review stair negotiation, floor transfer and HEP.  Pt performed all transfers and gait with crutches mod I.  Pt verbalized and return demonstrated safe stair negotiation with crutches up/down 4 stairs x 2 reps with close supervision.  In ADL apartment reviewed HEP with pt return demonstrating each exercise.  Reviewed safety with floor > furniture transfer-pt return demonstrated safely mod I.  Upon standing from couch pt reporting a "pop" in his L shoulder with pain radiating into deltoid area.  Pt able to ambulate back to room but pt reporting increased throbbing and pain in L shoulder and pt expressing inability to flex or ABD LUE.  Applied ice to L shoulder and alerted PA to pt complaints.  PA present to assess.  Pt left with ice in place and all items within reach.   Therapy Documentation Precautions:  Precautions Precautions: Fall Precaution Comments: R BKA Restrictions Weight Bearing Restrictions: Yes RLE Weight Bearing: Non weight bearing Pain: Pain Assessment Pain Assessment: 0-10 Pain Score: 6  Pain Type: Phantom pain Pain Location: Leg Pain Orientation: Right Pain Descriptors / Indicators: Burning Pain Onset: Sudden Pain Intervention(s): RN made aware   See Function Navigator for Current Functional Status.   Therapy/Group: Individual Therapy  Edman CircleHall, Audra [facility] 06/14/2015, 12:15 PM

## 2015-06-14 NOTE — Progress Notes (Signed)
Patient discharged home with aunt about 1806. Patient received all discharge instructions via Harvel Ricksan Anguilli PA. All questions answered. Patient left arm pain addressed with Harvel Ricksan Anguilli PA and xray results called to Harvel Ricksan Anguilli PA. Patient received one time dose of ibuprofen. Patient received oxy 10mg  before discharge. Patient discharged with all belongings. Patient reports Left arm pain better.

## 2015-06-14 NOTE — Progress Notes (Signed)
Occupational Therapy Session Note  Patient Details  Name: Gregory May MRN: [id number] Date of Birth: 1961-08-10  Today's Date: 06/14/2015 OT Individual Time: 1000-1035 OT Individual Time Calculation (min): 35 min    Short Term Goals: Week 1:  OT Short Term Goal 1 (Week 1): STG=LTG due to LOS  Skilled Therapeutic Interventions/Progress Updates:    1:1 Pt participated in self care tasks at mod I at shower level.  Education provided on foot care and importance of wearing a shoe during mobility. Also reinforced method for keeping residual limb clean and dry.   Therapy Documentation Precautions:  Precautions Precautions: Fall Precaution Comments: R BKA Restrictions Weight Bearing Restrictions: Yes RLE Weight Bearing: Non weight bearing    Vital Signs: Therapy Vitals Temp: 98.4 F (36.9 C) Temp Source: Oral Pulse Rate: 96 Resp: 18 BP: 122/77 mmHg Patient Position (if appropriate): Sitting Oxygen Therapy SpO2: 96 % O2 Device: Not Delivered Pain: Pain Assessment Pain Assessment: 0-10 Pain Score: 0-No pain (no pain when not moving ) Pain Type: Acute pain Pain Location: Arm Pain Orientation: Left Pain Descriptors / Indicators: Sore Pain Onset: With Activity Pain Intervention(s): Medication (See eMAR)  See Function Navigator for Current Functional Status.   Therapy/Group: Individual Therapy  Roney MansSmith, Yesenia Fontenette [facility] 06/14/2015, 2:50 PM

## 2015-06-14 NOTE — Discharge Instructions (Signed)
Inpatient Rehab Discharge Instructions  Gregory PotterVergil T May Discharge date and time: No discharge date for patient encounter.   Activities/Precautions/ Functional Status: Activity: activity as tolerated Diet: diabetic diet Wound Care: keep wound clean and dry Functional status:  ___ No restrictions     ___ Walk up steps independently ___ 24/7 supervision/assistance   ___ Walk up steps with assistance ___ Intermittent supervision/assistance  ___ Bathe/dress independently ___ Walk with walker     ___ Bathe/dress with assistance ___ Walk Independently    ___ Shower independently _x__ Walk with assistance    ___ Shower with assistance ___ No alcohol     ___ Return to work/school ________     COMMUNITY REFERRALS UPON DISCHARGE:    Medical Equipment/Items Ordered: tub bench                                                    Agency/Supplier:  [facility] @ [phone]   GENERAL COMMUNITY RESOURCES FOR PATIENT/FAMILY:  Support Groups: Amputee Support Group       Special Instructions:    My questions have been answered and I understand these instructions. I will adhere to these goals and the provided educational materials after my discharge from the hospital.  Patient/Caregiver Signature _______________________________ Date __________  Clinician Signature _______________________________________ Date __________  Please bring this form and your medication list with you to all your follow-up doctor's appointments.

## 2015-06-15 NOTE — Progress Notes (Signed)
Social Work Discharge Note  The overall goal for the admission was met for:   Discharge location: Yes - home  Length of Stay: Yes - 7 days  Discharge activity level: Yes - modified independent  Home/community participation: Yes  Services provided included: MD, RD, PT, OT, RN, Pharmacy and SW  Financial Services: Other: Medicaid and Social Security Disability applications submitted in August 2016  Follow-up services arranged: DME: tub transfer bench from [place] and Patient/Family has no preference for HH/DME agencies  Comments (or additional information):  Pt to return to his home with assistance from his aunt for dressing changes. She was trained by nurses at the bedside.  Pt was provided a signed handicap placard application for him to take to the [place].  Pt did not qualify for therapies, as he did not meet the Medicaid criteria for home therapies.  He will continue with home exercise program therapists gave him.  Pt was doing well at d/c and had met all of his goals.  He will have is aunt and youngest brother to assist as needed.  Patient/Family verbalized understanding of follow-up arrangements: Yes  Individual responsible for coordination of the follow-up plan: pt with assistance from his aunt and youngest brother  Confirmed correct DME delivered: Trey Sailors 06/15/2015    Jim Philemon, Silvestre Mesi

## 2015-06-21 ENCOUNTER — Encounter: Payer: Self-pay | Admitting: Internal Medicine

## 2015-06-21 ENCOUNTER — Ambulatory Visit (INDEPENDENT_AMBULATORY_CARE_PROVIDER_SITE_OTHER): Payer: Medicaid Other | Admitting: Internal Medicine

## 2015-06-21 VITALS — BP 117/73 | HR 71 | Temp 97.6°F | Wt 186.0 lb

## 2015-06-21 DIAGNOSIS — Z0000 Encounter for general adult medical examination without abnormal findings: Secondary | ICD-10-CM | POA: Insufficient documentation

## 2015-06-21 DIAGNOSIS — E1142 Type 2 diabetes mellitus with diabetic polyneuropathy: Secondary | ICD-10-CM

## 2015-06-21 DIAGNOSIS — Z794 Long term (current) use of insulin: Secondary | ICD-10-CM

## 2015-06-21 DIAGNOSIS — Z89611 Acquired absence of right leg above knee: Secondary | ICD-10-CM | POA: Diagnosis not present

## 2015-06-21 DIAGNOSIS — Z1211 Encounter for screening for malignant neoplasm of colon: Secondary | ICD-10-CM | POA: Insufficient documentation

## 2015-06-21 NOTE — Assessment & Plan Note (Signed)
See HPI for details.  T2DM- follow up with Dr. Lucianne MussKumar Continue 20 units of lantus and Reli-on Asked him to take Novolog instead of Reli-on at mealtime.

## 2015-06-21 NOTE — Patient Instructions (Signed)
Thank you for your visit today Please follow up with Dr. Lucianne MussKumar for your diabetes- please be aware of the symptoms of hypoglycemia- which we discussed. Please continue to do rehab for your leg.  You are also due for your colonoscopy- we can discuss at the next visit.

## 2015-06-21 NOTE — Assessment & Plan Note (Signed)
Offered colonoscopy but pt declined due to the current insurance denial. Defer at next visit and pt will think about FOBT cards when he comes back in 3 months

## 2015-06-21 NOTE — Assessment & Plan Note (Signed)
Continue to follow with Dr. Lajoyce Cornersuda and PM&R

## 2015-06-21 NOTE — Progress Notes (Signed)
Patient ID: Gregory May, male   DOB: May 20, 1961, 54 y.o.   MRN: [id number]   Subjective:   Patient ID: Gregory May male   DOB: 1961-04-08 54 y.o.   MRN: [id number]  HPI: [name] is a 54 y.o. man with IDDM, history of PE, recent above knee amputation of right leg, and other PMH noted below who is here for a hospital follow up visit. He was hospitalized last month for above knee amputation. He tolerated the surgery well, and is following with Dr. Lajoyce Corners who he had just seen prior to coming here and his staples were removed today. He says that he is doing well. He is doing the rehab and is following PM&R.   For his diabetes, the lantus was increased to 35 unitsat discharge, but he takes 20 units of Lantus, and he also takes Reli-on from [place]. He does not take the Novolog. He follows with Dr. Lucianne Muss of endocrinology who manages his diabetes. He says his lowest CBG was 70 which he "felt it", His postprandial CBGs are in 140s-160 range. His last A1c was 8.2 in August.   He says he is in the process of getting approved for Medicaid and disability but is very frustrated as he was denied the application as currently he does not have insurance. He lives with his aunt currently.       Past Medical History  Diagnosis Date  . Sebaceous cyst     on back of neck  . Diabetes mellitus     type II  . Pulmonary embolism (HCC)   . Deep vein thrombosis (DVT) (HCC)   . Arthritis     bilateral hips  . 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 DJD (degenerative joint disease)   . Pulmonary emboli (HCC) 02/08/2014    Date of diagnosis February 08 2014, on chest CTA Hospitalized for 3 days Had some symptoms of shortness of breath, and chest pain With intercurrent DVT of the left LE. Duration of anticoagulation: 8 months. End date 10/11/2014.  Anticoagulant: Lovenox 120 units daily Switched to Eliquis on 05/25/2014 per patient preference    Current Outpatient Prescriptions    Medication Sig Dispense Refill  . acetaminophen (TYLENOL) 325 MG tablet Take 2 tablets (650 mg total) by mouth every 6 (six) hours as needed for mild pain, fever or headache (or Fever >/= 101).    . cholecalciferol (VITAMIN D) 1000 UNITS tablet Take 1 tablet (1,000 Units total) by mouth daily. 30 tablet 1  . gabapentin (NEURONTIN) 300 MG capsule Take 1 capsule (300 mg total) by mouth 3 (three) times daily. 120 capsule 4  . Insulin Glargine (TOUJEO SOLOSTAR) 300 UNIT/ML SOPN Inject 35 Units into the skin at bedtime. (Patient taking differently: Inject 20 Units into the skin at bedtime. )    . methocarbamol (ROBAXIN) 500 MG tablet Take 1 tablet (500 mg total) by mouth every 6 (six) hours as needed for muscle spasms. 90 tablet 1  . oxyCODONE (OXY IR/ROXICODONE) 5 MG immediate release tablet Take 1-2 tablets (5-10 mg total) by mouth every 3 (three) hours as needed for breakthrough pain. 90 tablet 0  . pravastatin (PRAVACHOL) 40 MG tablet Take 1 tablet (40 mg total) by mouth daily. 30 tablet 3  . insulin aspart (NOVOLOG) 100 UNIT/ML injection Inject 12 Units into the skin 3 (three) times daily with meals. (Patient not taking: Reported on 06/21/2015) 10 mL 11  . tadalafil (CIALIS) 20 MG  tablet Take 1 tablet (20 mg total) by mouth daily as needed for erectile dysfunction. (Patient not taking: Reported on 06/21/2015) 5 tablet 1   No current facility-administered medications for this visit.   Family History  Problem Relation Age of Onset  . Cancer Mother     breast, colon, liver  . Diabetes Father    Social History   Social History  . Marital Status: Single    Spouse Name: N/A  . Number of Children: N/A  . Years of Education: N/A   Social History Main Topics  . Smoking status: Never Smoker   . Smokeless tobacco: Never Used  . Alcohol Use: 0.0 oz/week    0 Standard drinks or equivalent per week     Comment: occasional  . Drug Use: No  . Sexual Activity: No   Other Topics Concern  . None    Social History Narrative   Review of Systems: Review of Systems  Constitutional: Negative.  Negative for fever, chills, weight loss and malaise/fatigue.  HENT: Negative.   Respiratory: Negative for cough, shortness of breath and wheezing.   Cardiovascular: Negative.  Negative for chest pain, palpitations, orthopnea and claudication.  Gastrointestinal: Negative for nausea, vomiting, abdominal pain, diarrhea and constipation.  Musculoskeletal: Negative for joint pain.       Has phantom limb pain  Neurological: Positive for tingling and sensory change. Negative for dizziness, focal weakness, seizures and loss of consciousness.  Psychiatric/Behavioral: Positive for depression. Negative for memory loss. The patient is not nervous/anxious and does not have insomnia.     Objective:  Physical Exam: Filed Vitals:   06/21/15 1423  BP: 117/73  Pulse: 71  Temp: 97.6 F (36.4 C)  TempSrc: Oral  Weight: 186 lb (84.369 kg)  SpO2: 100%   Physical Exam  Constitutional: He is oriented to person, place, and time. He appears well-developed and well-nourished.  HENT:  Head: Normocephalic and atraumatic.  Eyes: EOM are normal.  Neck: Normal range of motion. Neck supple.  Cardiovascular: Normal rate, regular rhythm, normal heart sounds and intact distal pulses.   No murmur heard. Pulmonary/Chest: Effort normal and breath sounds normal. No respiratory distress.  Abdominal: Soft. Bowel sounds are normal. He exhibits no distension. There is no tenderness.  Musculoskeletal:  Has right above knee amputation and covered with black bandage, staples were removed today.  Pt denies any erythema or drainage  Pulses intact bilaterally  Neurological: He is alert and oriented to person, place, and time.  Skin: Skin is warm and dry.  Psychiatric: He has a normal mood and affect.    Assessment & Plan:   Please see problem based charting for assessment and plan

## 2015-06-21 NOTE — Assessment & Plan Note (Signed)
>>  ASSESSMENT AND PLAN FOR DIABETIC POLYNEUROPATHY ASSOCIATED WITH TYPE 2 DIABETES MELLITUS WRITTEN ON 06/21/2015  5:11 PM BY Deneise Lever, MD  See HPI for details.  T2DM- follow up with Dr. Lucianne Muss Continue 20 units of lantus and Reli-on Asked him to take Novolog instead of Reli-on at mealtime.

## 2015-06-22 NOTE — Progress Notes (Signed)
Internal Medicine Clinic Attending  I saw and evaluated the patient.  I personally confirmed the key portions of the history and exam documented by Dr. Johnny BridgeSaraiya and I reviewed pertinent patient test results.  The assessment, diagnosis, and plan were formulated together and I agree with the documentation in the resident's note. Pt got the Reli-on bc it was cheaper than the novolog. However, he is almost out of the reli-on but has a sample bottle of the Novolog, which he likes better, and plans to start using the novolog once the reli-on runs out. We educated pt on timing of pre-meal insulin. Pt is trying to make effort to control his DM now that he has lost leg and knows consequences of non control.

## 2015-06-29 ENCOUNTER — Encounter: Payer: Self-pay | Admitting: Physical Medicine & Rehabilitation

## 2015-07-11 ENCOUNTER — Other Ambulatory Visit (INDEPENDENT_AMBULATORY_CARE_PROVIDER_SITE_OTHER): Payer: Medicaid Other

## 2015-07-11 DIAGNOSIS — E1165 Type 2 diabetes mellitus with hyperglycemia: Secondary | ICD-10-CM

## 2015-07-11 DIAGNOSIS — IMO0002 Reserved for concepts with insufficient information to code with codable children: Secondary | ICD-10-CM

## 2015-07-11 DIAGNOSIS — E118 Type 2 diabetes mellitus with unspecified complications: Secondary | ICD-10-CM

## 2015-07-11 LAB — BASIC METABOLIC PANEL
BUN: 13 mg/dL (ref 6–23)
CO2: 27 mEq/L (ref 19–32)
Calcium: 9.3 mg/dL (ref 8.4–10.5)
Chloride: 101 mEq/L (ref 96–112)
Creatinine, Ser: 0.7 mg/dL (ref 0.40–1.50)
GFR: 125.5 mL/min (ref >60.00)
Glucose, Bld: 129 mg/dL — ABNORMAL HIGH (ref 70–99)
Potassium: 3.9 mEq/L (ref 3.5–5.1)
Sodium: 137 mEq/L (ref 135–145)

## 2015-07-11 LAB — LDL CHOLESTEROL, DIRECT: Direct LDL: 91 mg/dL

## 2015-07-13 ENCOUNTER — Encounter: Payer: Self-pay | Admitting: Endocrinology

## 2015-07-13 ENCOUNTER — Ambulatory Visit (INDEPENDENT_AMBULATORY_CARE_PROVIDER_SITE_OTHER): Payer: Medicaid Other | Admitting: Endocrinology

## 2015-07-13 ENCOUNTER — Telehealth: Payer: Self-pay | Admitting: Endocrinology

## 2015-07-13 VITALS — BP 106/62 | HR 93 | Temp 98.0°F | Resp 14 | Ht 72.0 in | Wt 187.4 lb

## 2015-07-13 DIAGNOSIS — E118 Type 2 diabetes mellitus with unspecified complications: Secondary | ICD-10-CM

## 2015-07-13 LAB — POCT GLYCOSYLATED HEMOGLOBIN (HGB A1C): Hemoglobin A1C: 7.2

## 2015-07-13 NOTE — Patient Instructions (Signed)
After Lantus out start Novolin N: take 8 units in am and then 12 at bedtime  Reduce am Novolin R to 10-12 when starting Novolin N, draw R first  Check blood sugars on waking up   times a week Also check blood sugars about 2 hours after a meal and do this after different meals by rotation  Recommended blood sugar levels on waking up is 90-130 and about 2 hours after meal is 130-160  Please bring your blood sugar monitor to each visit, thank you

## 2015-07-13 NOTE — Progress Notes (Signed)
Gregory May 54 y.o.           Reason for Appointment: Diabetes follow-up   History of Present Illness   Diagnosis: Type 2 DIABETES MELITUS, date of diagnosis:  1999     Previous history: He has previously been treated with metformin and Victoza and subsequently mealtime insulin added to control postprandial hyperglycemia Overall he has been  relatively noncompliant with his diet, medications, monitoring and followup Previously would not take Victoza regularly because of cost. Has not been taking any medications for the last year and a half because of commitments to family and cost A1c had increased to 12.3% and hyperglycemia discovered when he was hospitalized for foot ulcer. Also lost 20 pounds because of hyperglycemia   Recent history:   Insulin regimen: Toujeo/Lantus: 20 units at supper.  Novolog/ Regular 15-20 units at breakfast and ac or pc suppertime      His blood sugars overall have been much better control recently He has been able to take his insulin and adjusting the dose with glucose monitoring    Current blood sugar patterns and problems identified.   He says he is taking Novolog, previously was taking regular insulin when he could not afford it  He is adjusting the Novolog based on how much he is eating and usually is able to keep his postprandial readings down  Overall motivated to watch his diet better recently  With going up on his Toujeo his fasting readings are better, more recently using Lantus she had left over from before  No hypoglycemia also   He has not been taking any oral hypoglycemic drugs      Proper timing of injections in relation to meals:  usually   Oral hypoglycemic drugs: None        Side effects from medications: None       Monitors blood glucose: now.  Glucometer: One Touch.          Blood Glucose readings:   Mean values apply above for all meters except median for One Touch  PRE-MEAL Fasting Lunch Dinner Bedtime Overall    Glucose range:  90-145   74-170   93-102   80-183    Mean/median:  112     150   113      Meals:  usually 2 meals per day usually.  breakfast at 12 noon, evening meal 7 pm         Physical activity: exercise: None               Weight control:   Wt Readings from Last 3 Encounters:  07/13/15 187 lb 6.4 oz (85.004 kg)  06/21/15 186 lb (84.369 kg)  06/08/15 187 lb 4.8 oz (84.959 kg)           Diabetes labs:  Lab Results  Component Value Date   HGBA1C 7.2 07/13/2015   HGBA1C 8.2* 12/15/2014   HGBA1C 7.1* 09/15/2014   Lab Results  Component Value Date   MICROALBUR 0.8 04/23/2014   LDLCALC 95 07/13/2014   CREATININE 0.70 07/11/2015   Office Visit on 07/13/2015  Component Date Value Ref Range Status  . Hemoglobin A1C 07/13/2015 7.2   Final  Lab on 07/11/2015  Component Date Value Ref Range Status  . Sodium 07/11/2015 137  135 - 145 mEq/L Final  . Potassium 07/11/2015 3.9  3.5 - 5.1 mEq/L Final  . Chloride 07/11/2015 101  96 - 112 mEq/L Final  . CO2 07/11/2015 27  19 -  32 mEq/L Final  . Glucose, Bld 07/11/2015 129* 70 - 99 mg/dL Final  . BUN 45/40/981111/28/2016 13  6 - 23 mg/dL Final  . Creatinine, Ser 07/11/2015 0.70  0.40 - 1.50 mg/dL Final  . Calcium 91/47/829511/28/2016 9.3  8.4 - 10.5 mg/dL Final  . GFR 62/13/086511/28/2016 125.50  >60.00 mL/min Final  . Direct LDL 07/11/2015 91.0   Final   Optimal:  <100 mg/dLNear or Above Optimal:  100-129 mg/dLBorderline High:  130-159 mg/dLHigh:  160-189 mg/dLVery High:  >190 mg/dL       Medication List       This list is accurate as of: 07/13/15 11:59 PM.  Always use your most recent med list.               acetaminophen 325 MG tablet  Commonly known as:  TYLENOL  Take 2 tablets (650 mg total) by mouth every 6 (six) hours as needed for mild pain, fever or headache (or Fever >/= 101).     cholecalciferol 1000 UNITS tablet  Commonly known as:  VITAMIN D  Take 1 tablet (1,000 Units total) by mouth daily.     gabapentin 300 MG capsule   Commonly known as:  NEURONTIN  Take 1 capsule (300 mg total) by mouth 3 (three) times daily.     insulin aspart 100 UNIT/ML injection  Commonly known as:  novoLOG  Inject 12 Units into the skin 3 (three) times daily with meals.     Insulin Glargine 300 UNIT/ML Sopn  Commonly known as:  TOUJEO SOLOSTAR  Inject 35 Units into the skin at bedtime.     methocarbamol 500 MG tablet  Commonly known as:  ROBAXIN  Take 1 tablet (500 mg total) by mouth every 6 (six) hours as needed for muscle spasms.     oxyCODONE 5 MG immediate release tablet  Commonly known as:  Oxy IR/ROXICODONE  Take 1-2 tablets (5-10 mg total) by mouth every 3 (three) hours as needed for breakthrough pain.     pravastatin 40 MG tablet  Commonly known as:  PRAVACHOL  Take 1 tablet (40 mg total) by mouth daily.     tadalafil 20 MG tablet  Commonly known as:  CIALIS  Take 1 tablet (20 mg total) by mouth daily as needed for erectile dysfunction.        Allergies: No Known Allergies  Past Medical History  Diagnosis Date  . Sebaceous cyst     on back of neck  . Diabetes mellitus     type II  . Pulmonary embolism (HCC)   . Deep vein thrombosis (DVT) (HCC)   . Arthritis     bilateral hips  . 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 DJD (degenerative joint disease)   . Pulmonary emboli (HCC) 02/08/2014    Date of diagnosis February 08 2014, on chest CTA Hospitalized for 3 days Had some symptoms of shortness of breath, and chest pain With intercurrent DVT of the left LE. Duration of anticoagulation: 8 months. End date 10/11/2014.  Anticoagulant: Lovenox 120 units daily Switched to Eliquis on 05/25/2014 per patient preference     Past Surgical History  Procedure Laterality Date  . Rotator cuff repair  2005 (approx)    right   . Closed reduction with humer pin insertion  1974    left hip  . Joint replacement  2006 (approx)    right hip replaced  . Total hip arthroplasty Left 07/21/2014  DR Eulah Pont  . Total  hip arthroplasty Left 07/21/2014    Procedure: TOTAL HIP ARTHROPLASTY ANTERIOR APPROACH;  Surgeon: Loreta Ave, MD;  Location: [facility] OR;  Service: Orthopedics;  Laterality: Left;  . Hardware removal Left 07/21/2014    Procedure: HARDWARE REMOVAL;  Surgeon: Loreta Ave, MD;  Location: [facility] OR;  Service: Orthopedics;  Laterality: Left;  . Amputation Right 06/03/2015    Procedure: Right Below Knee Amputation;  Surgeon: Nadara Mustard, MD;  Location: [facility] OR;  Service: Orthopedics;  Laterality: Right;    Family History  Problem Relation Age of Onset  . Cancer Mother     breast, colon, liver  . Diabetes Father     Social History:  reports that he has never smoked. He has never used smokeless tobacco. He reports that he drinks alcohol. He reports that he does not use illicit drugs.  Review of Systems:  He had temper duration of his right leg below-knee on 06/03/15 and this has not completely healed yet  Lipids: He is on Pravachol with good control of LDL with LDL 91  Lab Results  Component Value Date   CHOL 164 07/13/2014   HDL 57.80 07/13/2014   LDLCALC 95 07/13/2014   LDLDIRECT 91.0 07/11/2015   TRIG 57.0 07/13/2014   CHOLHDL 3 07/13/2014    He still has some tingling and numbness in his feet and taking gabapentin for this with some relief      Examination:   BP 106/62 mmHg  Pulse 93  Temp(Src) 98 F (36.7 C)  Resp 14  Ht 6' (1.829 m)  Wt 187 lb 6.4 oz (85.004 kg)  BMI 25.41 kg/m2  SpO2 91%  Body mass index is 25.41 kg/(m^2).      ASSESSMENT/ PLAN:     Diabetes type 2:  Blood glucose control has been much improved with better compliance with his insulin as above Checking blood sugars mostly fasting but also has kept up with his Toujeo dose to keep his morning sugars are normal Postprandial readings are better with improved diet and consisting mealtime insulin Still requiring relatively large amounts of mealtime coverage He is insulin deficient and will continue to  be on insulin alone, currently BMI 25  Discussed how to dose NPH and regular insulin when he runs out of analog insulins  He will follow-up in 3 months with A1c  Continue follow-up with PCP and other physicians for various other problems   Patient Instructions  After Lantus out start Novolin N: take 8 units in am and then 12 at bedtime  Reduce am Novolin R to 10-12 when starting Novolin N, draw R first  Check blood sugars on waking up   times a week Also check blood sugars about 2 hours after a meal and do this after different meals by rotation  Recommended blood sugar levels on waking up is 90-130 and about 2 hours after meal is 130-160  Please bring your blood sugar monitor to each visit, thank you          [facility] 07/14/2015, 8:12 AM   Note: This office note was prepared with Dragon voice recognition system technology. Any transcriptional errors that result from this process are unintentional.

## 2015-07-13 NOTE — Telephone Encounter (Signed)
Patient would like for you to call him he has a question to ask you.

## 2015-07-14 ENCOUNTER — Other Ambulatory Visit: Payer: Self-pay | Admitting: *Deleted

## 2015-07-14 NOTE — Telephone Encounter (Signed)
He is not on any medication to lower blood pressure.  If he is not feeling lightheaded nothing to be concerned about. He can get a wrist brace for his hands as it may be carpal tunnel syndrome.  Needs to discuss with PCP if not improved

## 2015-07-14 NOTE — Telephone Encounter (Signed)
Patient called, he wants to know about why is blood pressure was low yesterday, is there any concern for that? He also forgot to discuss with you about his hands and arms going to sleep/tingling, he said it's getting much worse.  CB # [phone]

## 2015-07-14 NOTE — Telephone Encounter (Signed)
Noted, patient is aware. 

## 2015-07-27 ENCOUNTER — Encounter: Payer: Self-pay | Admitting: Physical Medicine & Rehabilitation

## 2015-07-27 ENCOUNTER — Encounter: Payer: Medicaid Other | Attending: Physical Medicine & Rehabilitation | Admitting: Physical Medicine & Rehabilitation

## 2015-07-27 ENCOUNTER — Inpatient Hospital Stay: Payer: Self-pay | Admitting: Physical Medicine & Rehabilitation

## 2015-07-27 VITALS — BP 121/76 | HR 69 | Resp 14

## 2015-07-27 DIAGNOSIS — E11621 Type 2 diabetes mellitus with foot ulcer: Secondary | ICD-10-CM | POA: Diagnosis not present

## 2015-07-27 DIAGNOSIS — E1142 Type 2 diabetes mellitus with diabetic polyneuropathy: Secondary | ICD-10-CM

## 2015-07-27 DIAGNOSIS — Z86711 Personal history of pulmonary embolism: Secondary | ICD-10-CM | POA: Diagnosis not present

## 2015-07-27 DIAGNOSIS — M1990 Unspecified osteoarthritis, unspecified site: Secondary | ICD-10-CM | POA: Insufficient documentation

## 2015-07-27 DIAGNOSIS — Z09 Encounter for follow-up examination after completed treatment for conditions other than malignant neoplasm: Secondary | ICD-10-CM | POA: Diagnosis not present

## 2015-07-27 DIAGNOSIS — E1140 Type 2 diabetes mellitus with diabetic neuropathy, unspecified: Secondary | ICD-10-CM | POA: Diagnosis not present

## 2015-07-27 DIAGNOSIS — Z86718 Personal history of other venous thrombosis and embolism: Secondary | ICD-10-CM | POA: Diagnosis not present

## 2015-07-27 DIAGNOSIS — Z89511 Acquired absence of right leg below knee: Secondary | ICD-10-CM | POA: Diagnosis not present

## 2015-07-27 NOTE — Patient Instructions (Signed)
WET TO DRY DRESSING TO OUTER RIGHT LEG WOUND DAILY. SCRUB AREA AS WELL WITH DRESSING CHANGE   PLEASE CALL ME WITH ANY PROBLEMS OR QUESTIONS [phone]). HAVE A HAPPY HOLIDAY SEASON!!!

## 2015-07-27 NOTE — Progress Notes (Signed)
Subjective:    Patient ID: Gregory May, male    DOB: 09/18/1960, 54 y.o.   MRN: [id number]  HPI   Gregory May is in here in follow up of his right BKA. He has had ongoing wound issues with the right limb as well as his left foot. He is using crutches and can ambulate on them independently. He has seen Dr. Audrie May's PA regarding the limb. He received percocet from that office as well. He is having limb pain and phantom pain but it seems to be improving. He is wearing a shrinker sock daily. He is not wearing any dressing driectly on the leg.    Pain Inventory Average Pain 8 Pain Right Now 7 My pain is sharp, burning, dull, stabbing and tingling  In the last 24 hours, has pain interfered with the following? General activity 3 Relation with others 1 Enjoyment of life 5 What TIME of day is your pain at its worst? morning, night Sleep (in general) Fair  Pain is worse with: bending, standing and some activites Pain improves with: rest and medication Relief from Meds: 8  Mobility walk with assistance ability to climb steps?  yes do you drive?  no needs help with transfers transfers alone Do you have any goals in this area?  yes  Function not employed: date last employed 02/02/2015 disabled: date disabled 06/03/2015 I need assistance with the following:  dressing, household duties and shopping  Neuro/Psych numbness tingling  Prior Studies hospital f/u  Physicians involved in your care hospital f/u   Family History  Problem Relation Age of Onset  . Cancer Mother     breast, colon, liver  . Diabetes Father    Social History   Social History  . Marital Status: Single    Spouse Name: N/A  . Number of Children: N/A  . Years of Education: N/A   Social History Main Topics  . Smoking status: Never Smoker   . Smokeless tobacco: Never Used  . Alcohol Use: 0.0 oz/week    0 Standard drinks or equivalent per week     Comment: occasional  . Drug Use: No  .  Sexual Activity: No   Other Topics Concern  . None   Social History Narrative   Past Surgical History  Procedure Laterality Date  . Rotator cuff repair  2005 (approx)    right   . Closed reduction with humer pin insertion  1974    left hip  . Joint replacement  2006 (approx)    right hip replaced  . Total hip arthroplasty Left 07/21/2014    DR May  . Total hip arthroplasty Left 07/21/2014    Procedure: TOTAL HIP ARTHROPLASTY ANTERIOR APPROACH;  Surgeon: Gregory Aveaniel F Murphy, MD;  [facility];  Service: Orthopedics;  Laterality: Left;  . Hardware removal Left 07/21/2014    Procedure: HARDWARE REMOVAL;  Surgeon: Gregory Aveaniel F Murphy, MD;  [facility];  Service: Orthopedics;  Laterality: Left;  . Amputation Right 06/03/2015    Procedure: Right Below Knee Amputation;  Surgeon: Gregory MustardMarcus Duda V, MD;  [facility];  Service: Orthopedics;  Laterality: Right;   Past Medical History  Diagnosis Date  . Sebaceous cyst     on back of neck  . Diabetes mellitus     type II  . Pulmonary embolism (HCC)   . Deep vein thrombosis (DVT) (HCC)   . Arthritis     bilateral hips  . Diabetic ulcer of heel (HCC)  Right heel  . DJD (degenerative joint disease)   . Pulmonary emboli (HCC) 02/08/2014    Date of diagnosis February 08 2014, on chest CTA Hospitalized for 3 days Had some symptoms of shortness of breath, and chest pain With intercurrent DVT of the left LE. Duration of anticoagulation: 8 months. End date 10/11/2014.  Anticoagulant: Lovenox 120 units daily Switched to Eliquis on 05/25/2014 per patient preference    BP 121/76 mmHg  Pulse 69  Resp 14  SpO2 98%  Opioid Risk Score:   Fall Risk Score:  `1  Depression screen PHQ 2/9  Dep[facility] screen [facility] 2/9 07/27/2015 06/21/2015 12/08/2014 09/15/2014 07/13/2014 05/25/2014 05/18/2014  Decreased Interest 0 0 0 0 0 0 0  Down, Depressed, Hopeless 0 0 0 0 0 0 0  PHQ - 2 Score 0 0 0 0 0 0 0  Altered sleeping 0 - - - - - -  Tired, decreased energy 0 -  - - - - -  Change in appetite 0 - - - - - -  Feeling bad or failure about yourself  0 - - - - - -  Trouble concentrating 0 - - - - - -  Moving slowly or fidgety/restless 0 - - - - - -  Suicidal thoughts 0 - - - - - -  PHQ-9 Score 0 - - - - - -     Review of Systems  Neurological: Positive for numbness.       Tingling  All other systems reviewed and are negative.      Objective:   Physical Exam    General: Alert and oriented x 3, No apparent distress HEENT: Head is normocephalic, atraumatic, PERRLA, EOMI, sclera anicteric, oral mucosa pink and moist, dentition intact, ext ear canals clear,  Neck: Supple without JVD or lymphadenopathy Heart: Reg rate and rhythm. No murmurs rubs or gallops Chest: CTA bilaterally without wheezes, rales, or rhonchi; no distress Abdomen: Soft, non-tender, non-distended, bowel sounds positive. Extremities: No clubbing, cyanosis, or edema. Pulses are 2+ Skin: 3 areas of major breakdown still along right bk incision. Right lateral area is still with fibronecrotic tissue. He has a couple smaller areas medially and one large area of eschar centrally. There are several scabs and openings on the left foot/mtp, etc.  Neuro: Pt is cognitively appropriate with normal insight, memory, and awareness. Cranial nerves 2-12 are intact. Sensory exam is normal. Reflexes are 2+ in all 4's. Fine motor coordination is intact. No tremors. Motor function is grossly 5/5 even in the amputated limb Musculoskeletal: Full ROM, No pain with AROM or PROM in the neck, trunk, or extremities including right BKA. Posture appropriate when sitting Psych: Pt's affect is appropriate. Pt is cooperative       Assessment & Plan:  Medical Problem List and Plan: 1. Functional deficits secondary to Right BKA secondary to abscess ulceration 06/03/2015 -at this point he is unable to perform work of any kind due to his right BKA, ongoing wound issues, hip arthritis, etc.  -he is a  K3 ambulator once he's up on a prosthetic limb   2. Diabetes mellitus with peripheral neuropathy. 3. Skin/Wound Care: wet to dry to outer portion of right stump.   -dry dressing, normal hygiene to remainder of stump and left foot. Wet to dry was applied today  -he has follow up for wounds/limb in about 2 weeks with Dr. Lajoyce Corners    He can see me prn. Thirty minutes of face to face patient care time  were spent during this visit. All questions were encouraged and answered.

## 2015-09-12 ENCOUNTER — Ambulatory Visit: Payer: Self-pay | Admitting: Endocrinology

## 2015-09-13 ENCOUNTER — Encounter: Payer: Self-pay | Admitting: *Deleted

## 2015-09-13 ENCOUNTER — Telehealth: Payer: Self-pay | Admitting: Endocrinology

## 2015-09-13 NOTE — Telephone Encounter (Signed)
Letter mailed

## 2015-09-13 NOTE — Telephone Encounter (Signed)
Patient no showed today's appt. Please advise on how to follow up. °A. No follow up necessary. °B. Follow up urgent. Contact patient immediately. °C. Follow up necessary. Contact patient and schedule visit in ___ days. °D. Follow up advised. Contact patient and schedule visit in ____weeks. ° °

## 2015-09-23 ENCOUNTER — Telehealth: Payer: Self-pay | Admitting: Endocrinology

## 2015-09-23 MED ORDER — INSULIN LISPRO 100 UNIT/ML ~~LOC~~ SOLN: 17.0000 [IU] | Freq: Three times a day (TID) | SUBCUTANEOUS | Status: DC

## 2015-09-23 NOTE — Telephone Encounter (Signed)
Please call in rx for pt's novolog or humalog whatever Medicaid will cover call into [facility] please

## 2015-10-31 ENCOUNTER — Ambulatory Visit (INDEPENDENT_AMBULATORY_CARE_PROVIDER_SITE_OTHER): Payer: Medicaid Other | Admitting: Internal Medicine

## 2015-10-31 ENCOUNTER — Encounter: Payer: Self-pay | Admitting: Internal Medicine

## 2015-10-31 DIAGNOSIS — H9201 Otalgia, right ear: Secondary | ICD-10-CM | POA: Insufficient documentation

## 2015-10-31 DIAGNOSIS — E118 Type 2 diabetes mellitus with unspecified complications: Secondary | ICD-10-CM

## 2015-10-31 DIAGNOSIS — R200 Anesthesia of skin: Secondary | ICD-10-CM | POA: Diagnosis not present

## 2015-10-31 DIAGNOSIS — Z794 Long term (current) use of insulin: Secondary | ICD-10-CM | POA: Diagnosis not present

## 2015-10-31 DIAGNOSIS — E1165 Type 2 diabetes mellitus with hyperglycemia: Secondary | ICD-10-CM

## 2015-10-31 DIAGNOSIS — R202 Paresthesia of skin: Secondary | ICD-10-CM | POA: Diagnosis not present

## 2015-10-31 LAB — POCT GLYCOSYLATED HEMOGLOBIN (HGB A1C): Hemoglobin A1C: 8.1

## 2015-10-31 LAB — GLUCOSE, CAPILLARY: Glucose-Capillary: 204 mg/dL — ABNORMAL HIGH (ref 65–99)

## 2015-10-31 NOTE — Progress Notes (Signed)
Patient ID: Gregory May, male   DOB: [birth year], 55 y.o.   MRN: [id number]     Subjective:   Patient ID: Gregory May male   DOB: [birth year] 55 y.o.   MRN: [id number]  HPI: [name] is a 55 y.o. man with PMH noted below who is here for right ear pain, right hand tingling and numbness and for follow up visit.       Past Medical History  Diagnosis Date  . Sebaceous cyst     on back of neck  . Diabetes mellitus     type II  . Pulmonary embolism (HCC)   . Deep vein thrombosis (DVT) (HCC)   . Arthritis     bilateral hips  . 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 DJD (degenerative joint disease)   . Pulmonary emboli (HCC) 02/08/2014    Date of diagnosis February 08 2014, on chest CTA Hospitalized for 3 days Had some symptoms of shortness of breath, and chest pain With intercurrent DVT of the left LE. Duration of anticoagulation: 8 months. End date 10/11/2014.  Anticoagulant: Lovenox 120 units daily Switched to Eliquis on 05/25/2014 per patient preference    Current Outpatient Prescriptions  Medication Sig Dispense Refill  . acetaminophen (TYLENOL) 325 MG tablet Take 2 tablets (650 mg total) by mouth every 6 (six) hours as needed for mild pain, fever or headache (or Fever >/= 101).    . cholecalciferol (VITAMIN D) 1000 UNITS tablet Take 1 tablet (1,000 Units total) by mouth daily. 30 tablet 1  . gabapentin (NEURONTIN) 300 MG capsule Take 1 capsule (300 mg total) by mouth 3 (three) times daily. 120 capsule 4  . insulin aspart (NOVOLOG) 100 UNIT/ML injection Inject 12 Units into the skin 3 (three) times daily with meals. (Patient taking differently: Inject 17 Units into the skin 3 (three) times daily with meals. ) 10 mL 11  . Insulin Glargine (TOUJEO SOLOSTAR) 300 UNIT/ML SOPN Inject 35 Units into the skin at bedtime. (Patient taking differently: Inject 18-22 Units into the skin at bedtime. )    . insulin lispro (HUMALOG) 100 UNIT/ML injection Inject 0.17 mLs  (17 Units total) into the skin 3 (three) times daily before meals. 20 mL 1  . methocarbamol (ROBAXIN) 500 MG tablet Take 1 tablet (500 mg total) by mouth every 6 (six) hours as needed for muscle spasms. 90 tablet 1  . oxyCODONE-acetaminophen (PERCOCET/ROXICET) 5-325 MG tablet Take 1 tablet by mouth every 4 (four) hours as needed for severe pain.    . pravastatin (PRAVACHOL) 40 MG tablet Take 1 tablet (40 mg total) by mouth daily. 30 tablet 3   No current facility-administered medications for this visit.   Family History  Problem Relation Age of Onset  . Cancer Mother     breast, colon, liver  . Diabetes Father    Social History   Social History  . Marital Status: Single    Spouse Name: N/A  . Number of Children: N/A  . Years of Education: N/A   Social History Main Topics  . Smoking status: Never Smoker   . Smokeless tobacco: Never Used  . Alcohol Use: 0.0 oz/week    0 Standard drinks or equivalent per week     Comment: occasional  . Drug Use: No  . Sexual Activity: No   Other Topics Concern  . None   Social History Narrative   Review of Systems:  Constitutional: Negative for fever,  chills, weight loss and malaise/fatigue.  HEENT: No headaches, vision problems, cough, hearing problems , no drainage from ear,  Respiratory: Negative for cough, shortness of breath and wheezing.  Cardiovascular: Negative for chest pain, orthopnea, or PND   Musculoskeletal: positive for right hand tingling and numbness Neurological: positive  for tingling in right hand . No falls.    Objective:  Physical Exam: Filed Vitals:   10/31/15 1522  BP: 133/81  Pulse: 82  Temp: 97.7 F (36.5 C)  TempSrc: Oral  Height: 6' (1.829 m)  Weight: 200 lb 14.4 oz (91.128 kg)  SpO2: 97%    General: A&O, in NAD HEENT: left ear has blue tympanostomy tube, no cerumen, Right ear seems to have perforated tympanic membrane, no tube present and no drainage present  Neck: supple, midline trachea,  CV:  RRR, normal s1, s2, no m/r/g, no carotid bruits appreciated Resp: equal and symmetric breath sounds, no wheezing or crackles  Abdomen: soft, nontender, nondistended, +BS in all 4 quadrants Extremities: pulses intact b/l, no edema, clubbing or cyanosis,    Right hand: Positive tinel sign, positive phalen maneuver in hands    Assessment & Plan:   Please see problem based charting for assessment and plan

## 2015-10-31 NOTE — Assessment & Plan Note (Signed)
Pt follows with Dr Lucianne MussKumar of Endocrinology. He intermittently checks his CBG. He did not bring his meter. He takes Lantus 20 units and Humalog with meals. His A1c today was 8.1. He denies symptomatic lows.  We asked him to follow up with endocrine for continuing management of his diabetes and appreciate their recommendations.

## 2015-10-31 NOTE — Assessment & Plan Note (Signed)
Pt has persistent tingling and numbness in right thumb and 1st three fingers and the numbness is persistent and before it was periodic. It is worse with heavy lifting. On exam, both tinel and Phalen were positive and tingling reproducible.   I advised him to wear a night time splint which he can buy OTC. We tried to find a splint in clinic, but not the right size.  I asked him to follow up with Dr Lajoyce Cornersuda since he can not afford to see sports medicine for symptomatic steroid injection. Also, pt already has appt with Dr Lajoyce Cornersuda in April. I also asked him to avoid heavy lifting with that hand and to also try taking gabapentin which he already has but was not taking it in the last 3 months due to inability to afford it. He received the medicaid card this month so he should be able to receive the medications.

## 2015-10-31 NOTE — Assessment & Plan Note (Signed)
Pt says he had the tympanostomy tubes placed back in August 2016 before undergoing the hyperbaric treatment for his wound. He felt that his right tube was out last month. And since then has been having persistent pain. On exam, the tympanic membrane seems to be perforated and no tube was present in the right ear. In the left ear, the tube was present.  I do not see any drainage or impaction. I do not think he needs any antibiotics currently.   [facility] [place] [facility] for further management.

## 2015-10-31 NOTE — Patient Instructions (Signed)
Thank you for your visit today Please let Dr. Lajoyce Cornersuda know about your tingling and numbness in your right hand Please follow up with Dr. Lucianne MussKumar for your blood sugar Please follow up with the ENT doctor here in regards to your right ear pain

## 2015-11-01 NOTE — Progress Notes (Signed)
Internal Medicine Clinic Attending  Case discussed with Dr. Saraiya at the time of the visit.  We reviewed the resident's history and exam and pertinent patient test results.  I agree with the assessment, diagnosis, and plan of care documented in the resident's note.  

## 2015-11-24 ENCOUNTER — Encounter: Payer: Self-pay | Admitting: Physical Therapy

## 2015-11-24 ENCOUNTER — Ambulatory Visit: Payer: Medicaid Other | Attending: Orthopedic Surgery | Admitting: Physical Therapy

## 2015-11-24 DIAGNOSIS — R2689 Other abnormalities of gait and mobility: Secondary | ICD-10-CM

## 2015-11-24 DIAGNOSIS — R29898 Other symptoms and signs involving the musculoskeletal system: Secondary | ICD-10-CM

## 2015-11-24 DIAGNOSIS — R2681 Unsteadiness on feet: Secondary | ICD-10-CM

## 2015-11-24 DIAGNOSIS — M25551 Pain in right hip: Secondary | ICD-10-CM

## 2015-11-24 DIAGNOSIS — M6281 Muscle weakness (generalized): Secondary | ICD-10-CM

## 2015-11-25 NOTE — Therapy (Signed)
[facility] [address] [place], [place], [address] Phone: [phone]   Fax:  [phone]  Physical Therapy Evaluation  Patient Details  Name: Gregory May MRN: [id number] Date of Birth: Sep 21, 1960 Referring Provider: Aldean Baker, MD  Encounter Date: 11/24/2015      PT End of Session - 11/24/15 1715    Visit Number 1   Number of Visits 9   Authorization Type Medicaid   PT Start Time 1615   PT Stop Time 1703   PT Time Calculation (min) 48 min   Equipment Utilized During Treatment Gait belt   Activity Tolerance Patient tolerated treatment well   Behavior During Therapy [facility] for tasks assessed/performed      Past Medical History  Diagnosis Date  . Sebaceous cyst     on back of neck  . Diabetes mellitus     type II  . Pulmonary embolism (HCC)   . Deep vein thrombosis (DVT) (HCC)   . Arthritis     bilateral hips  . 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 DJD (degenerative joint disease)   . Pulmonary emboli (HCC) 02/08/2014    Date of diagnosis February 08 2014, on chest CTA Hospitalized for 3 days Had some symptoms of shortness of breath, and chest pain With intercurrent DVT of the left LE. Duration of anticoagulation: 8 months. End date 10/11/2014.  Anticoagulant: Lovenox 120 units daily Switched to Eliquis on 05/25/2014 per patient preference     Past Surgical History  Procedure Laterality Date  . Rotator cuff repair  2005 (approx)    right   . Closed reduction with humer pin insertion  1974    left hip  . Joint replacement  2006 (approx)    right hip replaced  . 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 Total hip arthroplasty Left 07/21/2014    Procedure: TOTAL HIP ARTHROPLASTY ANTERIOR APPROACH;  Surgeon: Loreta Ave, MD;  Location: [facility] OR;  Service: Orthopedics;  Laterality: Left;  . Hardware removal Left 07/21/2014    Procedure: HARDWARE REMOVAL;  Surgeon: Loreta Ave,  MD;  Location: [facility] OR;  Service: Orthopedics;  Laterality: Left;  . Amputation Right 06/03/2015    Procedure: Right Below Knee Amputation;  Surgeon: Nadara Mustard, MD;  Location: [facility] OR;  Service: Orthopedics;  Laterality: Right;    There were no vitals filed for this visit.       Subjective Assessment - 11/24/15 1627    Subjective This 55yo male underwent right Transtibial Amputation on 06/02/2016 due to infected wound that had limited his mobilty for ~1 year. Limited mobility led to deconditioning. He recieved his first prosthesis on 11/07/2015 and is dependent in wear & use. He presents for PT evaluation to learn to use his prosthesis for function.    Pertinent History Left THR 07/21/2014, Right THR 2004, DM2, neuropathy, multiple joint surgeries, PE/DVT, drug abuse, arthritis, right RTC   Limitations Lifting;Standing;Walking   Patient Stated Goals to use prosthesis to get back in community.    Currently in Pain? Yes   Pain Score 4   in last week, worst 7/10, best 0/10   Pain Location Hip   Pain Orientation Right;Left   Pain Descriptors / Indicators Aching   Pain Type Chronic pain   Pain Onset More than a month ago   Pain Frequency Intermittent   Aggravating Factors  sitting or standing too long  Pain Relieving Factors if sitting too lond, standing or if standing too long, sitting   Effect of Pain on Daily Activities limits standing   Multiple Pain Sites No            [facility] PT Assessment - 11/24/15 1615    Assessment   Medical Diagnosis Right Transtibial Amputation   Referring Provider Aldean Baker, MD   Onset Date/Surgical Date 11/07/15   Hand Dominance Right   Precautions   Precautions Fall   Restrictions   Weight Bearing Restrictions No   Balance Screen   Has the patient fallen in the past 6 months Yes   How many times? 1  closing blinds & crutch caught, bruised limb   Has the patient had a decrease in activity level because of a fear of falling?  No   Is the  patient reluctant to leave their home because of a fear of falling?  No   Home Environment   Living Environment Private residence   Living Arrangements Other relatives  currently living with aunt, but goal to go home w/ brother   Type of Home House   Home Access Stairs to enter   Entrance Stairs-Number of Steps 2 to aunt's house, 5 steps to his house   Entrance Stairs-Rails None;Right  aunt's no rail, his house has right rail   Home Layout One level   Home Equipment Crutches;Bedside commode;Shower seat;Tub bench;Wheelchair - manual   Prior Function   Level of Independence Independent;Independent with community mobility with device;Independent with household mobility without device;Independent with gait   Observation/Other Assessments   Focus on Therapeutic Outcomes (FOTO)  39.07 Functional Status   Fear Avoidance Belief Questionnaire (FABQ)  76 (21)   Posture/Postural Control   Posture/Postural Control Postural limitations   Postural Limitations Rounded Shoulders;Forward head;Flexed trunk;Weight shift left   ROM / Strength   AROM / PROM / Strength Strength;AROM   AROM   Overall AROM  Within functional limits for tasks performed   Strength   Overall Strength Deficits   Overall Strength Comments LUE shoulder & Bil. elbows WFL, Rt shoulder 3-/5, fair grip   Strength Assessment Site Hip;Knee;Ankle   Right/Left Hip Right;Left   Right Hip Flexion 4/5   Right Hip Extension 3/5   Right Hip ABduction 3/5   Left Hip Flexion 4/5   Left Hip Extension 3/5   Left Hip ABduction 3/5   Right/Left Knee Right;Left   Right Knee Flexion 4-/5   Right Knee Extension 4/5   Left Knee Flexion 4-/5   Left Knee Extension 4/5   Right/Left Ankle Left   Left Ankle Dorsiflexion 4/5   Left Ankle Plantar Flexion 3-/5   Transfers   Transfers Sit to Stand;Stand to Sit   Sit to Stand 5: Supervision;With upper extremity assist;With armrests;From chair/3-in-1   Stand to Sit 5: Supervision;With upper extremity  assist;With armrests;To chair/3-in-1   Ambulation/Gait   Ambulation/Gait Yes   Ambulation/Gait Assistance 5: Supervision   Ambulation Distance (Feet) 70 Feet   Assistive device Prosthesis;Crutches   Gait Pattern Step-through pattern;Decreased step length - left;Decreased stance time - right;Decreased stride length;Decreased hip/knee flexion - right;Decreased weight shift to right;Right hip hike;Right circumduction;Abducted- right;Poor foot clearance - right   Ambulation Surface Indoor;Level   Gait velocity 2.06 ft/sec   Standardized Balance Assessment   Standardized Balance Assessment Berg Balance Test   Berg Balance Test   Sit to Stand Able to stand  independently using hands   Standing Unsupported Able to stand 2  minutes with supervision   Sitting with Back Unsupported but Feet Supported on Floor or Stool Able to sit safely and securely 2 minutes   Stand to Sit Controls descent by using hands   Transfers Able to transfer safely, definite need of hands   Standing Unsupported with Eyes Closed Able to stand 10 seconds with supervision   Standing Ubsupported with Feet Together Able to place feet together independently and stand for 1 minute with supervision   From Standing, Reach Forward with Outstretched Arm Reaches forward but needs supervision   From Standing Position, Pick up Object from Floor Able to pick up shoe, needs supervision   From Standing Position, Turn to Look Behind Over each Shoulder Needs supervision when turning   Turn 360 Degrees Needs assistance while turning   Standing Unsupported, Alternately Place Feet on Step/Stool Needs assistance to keep from falling or unable to try   Standing Unsupported, One Foot in Front Needs help to step but can hold 15 seconds   Standing on One Leg Tries to lift leg/unable to hold 3 seconds but remains standing independently   Total Score 29         Prosthetics Assessment - 11/24/15 1615    Prosthetics   Prosthetic Care Dependent with  Skin check;Residual limb care;Prosthetic cleaning;Care of non-amputated limb;Ply sock cleaning;Correct ply sock adjustment;Proper wear schedule/adjustment;Proper weight-bearing schedule/adjustment   Donning prosthesis  Supervision   Doffing prosthesis  Supervision   Current prosthetic wear tolerance (days/week)  reports ~half of 18 days since delivery   Current prosthetic wear tolerance (#hours/day)  wore up to 8 hrs one day but redness & tenderness   Edema none   Residual limb condition  no open areas, redness & tenderness at distal tibia, scar is adhered at distal tibia, minimal to no hair growth, shiny skin,                              PT Short Term Goals - 11/24/15 1715    PT SHORT TERM GOAL #1   Title Patient verbalizes proper cleaning & demonstrates proper donning of prosthesis. (Target Date: 4th visit after eval)   Baseline Patient is dependent in proper cleaning & donning.    Status New   PT SHORT TERM GOAL #2   Title Patient tolerates prosthesis wear >8hrs total per day without skin issues or tenderness.  (Target Date: 4th visit after eval)   Baseline Patient has worn prosthesis ~50% of 18 days since delivery. When he attempted wear for 8 hrs one day, he had redness & tenderness.    Status New   PT SHORT TERM GOAL #3   Title Patient ambulates 400' with crutches & prosthesis with cues on deviations but no balance issues noted.  (Target Date: 4th visit after eval)   Baseline Patient ambulates 27' with crutches & prosthesis with supervision with gait deviations.    Status New   PT SHORT TERM GOAL #4   Title Patient negotiates ramps & curbs with crutches & prosthesis with supervision.  (Target Date: 4th visit after eval)   Baseline Patient is unknowlegeable in prosthesis gait on barriers.    Status New           PT Long Term Goals - 11/24/15 1715    PT LONG TERM GOAL #1   Title patient demonstrates & verbalizes proper prosthetic care to enable safe use of  prosthesis. (Target Date: 8th visit after evaluation)  Baseline Patient is dependent in prosthetic care including cleaning, residual limb care, donning properly, adjusting ply socks and adjusting wear schedule.    Status New   PT LONG TERM GOAL #2   Title Patient tolerates wear of prosthesis >90% of awake hours without skin issues or limb tenderness to enable function throughout his day. (Target Date: 8th visit after evaluation)   Baseline Patient has worn prosthesis ~50% of 18 days since delivery. He attempted wear 8 hours one day but had redness & tenderness due to increase too rapid.    Status New   PT LONG TERM GOAL #3   Title Berg Balance >45/56 to indicate lower risk of falls. (Target Date: 8th visit after evaluation)   Baseline Berg Balance 29/56   Status New   PT LONG TERM GOAL #4   Title Patient ambulates >500' outdoor surfaces including grass, ramps, curbs and stairs with LRAD & prosthesis modified independent to enable community mobility. (Target Date: 8th visit after evaluation)   Baseline Patient ambulates 11' with crutches & prosthesis with supervision and gait deviations indicating fall risk.    Status New   PT LONG TERM GOAL #5   Title Patient ambulates around furniture carrying household items with LRAD & prosthesis to enable safe household function. (Target Date: 8th visit after evaluation)   Baseline Patient ambulates 25' with crutches & prosthesis with supervision and gait deviations indicating fall risk.    Status New               Plan - 11/24/15 1715    Clinical Impression Statement Patient's condition is evolving and plan of care is moderate. Patient recieved his first prosthesis on 11/07/2015 and is dependent in safe use & care. He requires skilled instruction to progress wear to use prosthesis without issues. He is limited in wear time which limits function during his awake hours. Berg Balance Test of 29/56 which indicates high fall risk. Patient's gait with  prosthesis is dependent on crutches for BUE support with gait deviations placing him at risk of falls. Patient needs instruction for prosthetic gait including barriers, scanning & negotiating obstacles.    Rehab Potential Good   Clinical Impairments Affecting Rehab Potential deconditioning from limited mobility over long period, multiple medical issues.   PT Frequency 1x / week   PT Duration 8 weeks   PT Treatment/Interventions ADLs/Self Care Home Management;DME Instruction;Gait training;Stair training;Functional mobility training;Therapeutic activities;Therapeutic exercise;Balance training;Neuromuscular re-education;Patient/family education;Prosthetic Training   PT Next Visit Plan Instruct in prosthetic care, prosthetic gait with crutches including barriers   Consulted and Agree with Plan of Care Patient      Patient will benefit from skilled therapeutic intervention in order to improve the following deficits and impairments:  Abnormal gait, Decreased activity tolerance, Decreased balance, Decreased coordination, Decreased endurance, Decreased knowledge of use of DME, Decreased mobility, Decreased strength, Decreased scar mobility, Pain, Prosthetic Dependency, Postural dysfunction  Visit Diagnosis: Other abnormalities of gait and mobility  Unsteadiness on feet  Muscle weakness (generalized)  Other symptoms and signs involving the musculoskeletal system  Pain in right hip     Problem List Patient Active Problem List   Diagnosis Date Noted  . Right ear pain 10/31/2015  . Healthcare maintenance 06/21/2015  . Status post below knee amputation of right lower extremity (HCC) 06/07/2015  . Infection of right foot 06/01/2015  . Osteomyelitis (HCC) 06/01/2015  . Diabetic foot infection (HCC) 06/01/2015  . Diabetic polyneuropathy associated with type 2 diabetes mellitus (HCC) 12/09/2014  .  Numbness and tingling in right hand 12/09/2014  . DJD (degenerative joint disease) of knee  07/21/2014  . Osteoarthritis, hip, bilateral 05/25/2014  . Pulmonary emboli (HCC) 02/08/2014  . DVT (deep venous thrombosis) (HCC) 02/08/2014  . Onychomycosis 10/14/2013  . Pure hypercholesterolemia 09/25/2013  . Diabetes mellitus type 2, controlled, with complications (HCC) 09/25/2013  . Diabetic foot ulcer (HCC) 09/15/2013  . Poorly controlled type 2 diabetes mellitus with complication (HCC) 09/15/1997    Phat Dalton PT, DPT 11/25/2015, 12:30 PM  [facility] [address] [place], [place], [address] Phone: [phone]   Fax:  [phone]  Name: Gregory May MRN: [id number] Date of Birth: May 31, 1961

## 2015-12-01 ENCOUNTER — Other Ambulatory Visit (INDEPENDENT_AMBULATORY_CARE_PROVIDER_SITE_OTHER): Payer: Medicaid Other

## 2015-12-01 ENCOUNTER — Other Ambulatory Visit: Payer: Self-pay | Admitting: *Deleted

## 2015-12-01 DIAGNOSIS — E1165 Type 2 diabetes mellitus with hyperglycemia: Secondary | ICD-10-CM | POA: Diagnosis not present

## 2015-12-01 DIAGNOSIS — E118 Type 2 diabetes mellitus with unspecified complications: Principal | ICD-10-CM

## 2015-12-01 DIAGNOSIS — IMO0002 Reserved for concepts with insufficient information to code with codable children: Secondary | ICD-10-CM

## 2015-12-01 LAB — BASIC METABOLIC PANEL
BUN: 14 mg/dL (ref 6–23)
CO2: 27 mEq/L (ref 19–32)
Calcium: 9.3 mg/dL (ref 8.4–10.5)
Chloride: 100 mEq/L (ref 96–112)
Creatinine, Ser: 0.96 mg/dL (ref 0.40–1.50)
GFR: 87.04 mL/min (ref >60.00)
Glucose, Bld: 250 mg/dL — ABNORMAL HIGH (ref 70–99)
Potassium: 4 mEq/L (ref 3.5–5.1)
Sodium: 134 mEq/L — ABNORMAL LOW (ref 135–145)

## 2015-12-01 LAB — LIPID PANEL
Cholesterol: 146 mg/dL (ref 0–200)
HDL: 42.5 mg/dL (ref >39.00)
LDL Cholesterol: 92 mg/dL (ref 0–99)
NonHDL: 103.37
Total CHOL/HDL Ratio: 3
Triglycerides: 55 mg/dL (ref 0.0–149.0)
VLDL: 11 mg/dL (ref 0.0–40.0)

## 2015-12-01 LAB — HEMOGLOBIN A1C: Hgb A1c MFr Bld: 9 % — ABNORMAL HIGH (ref 4.6–6.5)

## 2015-12-12 ENCOUNTER — Ambulatory Visit: Payer: Medicaid Other | Attending: Orthopedic Surgery | Admitting: Physical Therapy

## 2015-12-12 DIAGNOSIS — R29898 Other symptoms and signs involving the musculoskeletal system: Secondary | ICD-10-CM | POA: Insufficient documentation

## 2015-12-12 DIAGNOSIS — M6281 Muscle weakness (generalized): Secondary | ICD-10-CM | POA: Insufficient documentation

## 2015-12-12 DIAGNOSIS — M25551 Pain in right hip: Secondary | ICD-10-CM | POA: Insufficient documentation

## 2015-12-12 DIAGNOSIS — R2681 Unsteadiness on feet: Secondary | ICD-10-CM | POA: Insufficient documentation

## 2015-12-12 DIAGNOSIS — R2689 Other abnormalities of gait and mobility: Secondary | ICD-10-CM | POA: Insufficient documentation

## 2015-12-12 DIAGNOSIS — Z89611 Acquired absence of right leg above knee: Secondary | ICD-10-CM | POA: Insufficient documentation

## 2015-12-13 ENCOUNTER — Encounter: Payer: Self-pay | Admitting: Endocrinology

## 2015-12-13 ENCOUNTER — Ambulatory Visit (INDEPENDENT_AMBULATORY_CARE_PROVIDER_SITE_OTHER): Payer: Medicaid Other | Admitting: Endocrinology

## 2015-12-13 VITALS — BP 116/68 | HR 69 | Temp 97.9°F | Resp 16 | Ht 73.0 in | Wt 205.6 lb

## 2015-12-13 DIAGNOSIS — E1165 Type 2 diabetes mellitus with hyperglycemia: Secondary | ICD-10-CM

## 2015-12-13 DIAGNOSIS — G5601 Carpal tunnel syndrome, right upper limb: Secondary | ICD-10-CM | POA: Diagnosis not present

## 2015-12-13 DIAGNOSIS — Z794 Long term (current) use of insulin: Secondary | ICD-10-CM

## 2015-12-13 NOTE — Patient Instructions (Signed)
Lantus 25 units   Keep am sugar in range:  Check blood sugars on waking up 4-5  times a week  Also check blood sugars about 2 hours after a meal and do this after different meals by rotation  Recommended blood sugar levels on waking up is 90-130 and about 2 hours after meal is 130-160  Please bring your blood sugar monitor to each visit, thank you

## 2015-12-13 NOTE — Progress Notes (Signed)
Gregory May 55 y.o.           Reason for Appointment: Diabetes follow-up   History of Present Illness   Diagnosis: Type 2 DIABETES MELITUS, date of diagnosis:  1999     Previous history: He has previously been treated with metformin and Victoza and subsequently mealtime insulin added to control postprandial hyperglycemia Overall he has been  relatively noncompliant with his diet, medications, monitoring and followup Previously would not take Victoza regularly because of cost. Has not been taking any medications for the last year and a half because of commitments to family and cost A1c had increased to 12.3% and hyperglycemia discovered when he was hospitalized for foot ulcer. Also lost 20 pounds because of hyperglycemia   Recent history:   Insulin regimen: Lantus: 20 units at supper. Humalog   20 units at breakfast and suppertime      He has not been seen in follow-up since 11/18 His blood sugars overall have been much worse with A1c going up to 9%    Management, blood sugar patterns and problems identified.   He has not been watching his diet and eating more high-fat meals, eating out, having foods like pizza and also adding more cocktails to his drinks as well as bedtime snacks like cookies.  He is probably checking his blood sugars sporadically and did not bring his monitor  He has not increased his insulin even though his blood sugars were high even fasting  He does not think he is sugars are high after meals but probably not checking after lunch  His glucose the lab right after lunch was 250  Not able to exercise much because of his prosthesis   He has not been taking any oral hypoglycemic drugs      Proper timing of injections in relation to meals:  usually   Oral hypoglycemic drugs: None        Side effects from medications: None       Monitors blood glucose: now.  Glucometer: One Touch.          Blood Glucose readings:  By recall   PRE-MEAL Fasting Lunch  Dinner Bedtime Overall  Glucose range: 165-215   180   Mean/median:         Meals:  usually 2 meals per day usually.  breakfast at 12 noon, evening meal 7 pm   Physical activity: exercise: None               Weight control:   Wt Readings from Last 3 Encounters:  12/13/15 205 lb 9.6 oz (93.26 kg)  10/31/15 200 lb 14.4 oz (91.128 kg)  07/13/15 187 lb 6.4 oz (85.004 kg)           Diabetes labs:  Lab Results  Component Value Date   HGBA1C 9.0* 12/01/2015   HGBA1C 8.1 10/31/2015   HGBA1C 7.2 07/13/2015   Lab Results  Component Value Date   MICROALBUR 0.8 04/23/2014   LDLCALC 92 12/01/2015   CREATININE 0.96 12/01/2015   No visits with results within 1 Week(s) from this visit. Latest known visit with results is:  Lab on 12/01/2015  Component Date Value Ref Range Status  . Hgb A1c MFr Bld 12/01/2015 9.0* 4.6 - 6.5 % Final   Glycemic Control Guidelines for People with Diabetes:Non Diabetic:  <6%Goal of Therapy: <7%Additional Action Suggested:  >8%   . Sodium 12/01/2015 134* 135 - 145 mEq/L Final  . Potassium 12/01/2015 4.0  3.5 - 5.1  mEq/L Final  . Chloride 12/01/2015 100  96 - 112 mEq/L Final  . CO2 12/01/2015 27  19 - 32 mEq/L Final  . Glucose, Bld 12/01/2015 250* 70 - 99 mg/dL Final  . BUN 16/10/960404/20/2017 14  6 - 23 mg/dL Final  . Creatinine, Ser 12/01/2015 0.96  0.40 - 1.50 mg/dL Final  . Calcium 54/09/811904/20/2017 9.3  8.4 - 10.5 mg/dL Final  . GFR 14/78/295604/20/2017 87.04  >60.00 mL/min Final  . Cholesterol 12/01/2015 146  0 - 200 mg/dL Final   ATP III Classification       Desirable:  < 200 mg/dL               Borderline High:  200 - 239 mg/dL          High:  > = 213240 mg/dL  . Triglycerides 12/01/2015 55.0  0.0 - 149.0 mg/dL Final   Normal:  <086<150 mg/dLBorderline High:  150 - 199 mg/dL  . HDL 12/01/2015 42.50  >39.00 mg/dL Final  . VLDL 57/84/696204/20/2017 11.0  0.0 - 40.0 mg/dL Final  . LDL Cholesterol 12/01/2015 92  0 - 99 mg/dL Final  . Total CHOL/HDL Ratio 12/01/2015 3   Final                   Men          Women1/2 Average Risk     3.4          3.3Average Risk          5.0          4.42X Average Risk          9.6          7.13X Average Risk          15.0          11.0                      . NonHDL 12/01/2015 103.37   Final   NOTE:  Non-HDL goal should be 30 mg/dL higher than patient's LDL goal (i.e. LDL goal of < 70 mg/dL, would have non-HDL goal of < 100 mg/dL)       Medication List       This list is accurate as of: 12/13/15  2:40 PM.  Always use your most recent med list.               acetaminophen 325 MG tablet  Commonly known as:  TYLENOL  Take 2 tablets (650 mg total) by mouth every 6 (six) hours as needed for mild pain, fever or headache (or Fever >/= 101).     cholecalciferol 1000 units tablet  Commonly known as:  VITAMIN D  Take 1 tablet (1,000 Units total) by mouth daily.     gabapentin 300 MG capsule  Commonly known as:  NEURONTIN  Take 1 capsule (300 mg total) by mouth 3 (three) times daily.     insulin glargine 100 UNIT/ML injection  Commonly known as:  LANTUS  Inject 17-20 Units into the skin at bedtime.     insulin lispro 100 UNIT/ML injection  Commonly known as:  HUMALOG  Inject 0.17 mLs (17 Units total) into the skin 3 (three) times daily before meals.     methocarbamol 500 MG tablet  Commonly known as:  ROBAXIN  Take 1 tablet (500 mg total) by mouth every 6 (six) hours as needed for muscle spasms.     oxyCODONE-acetaminophen  5-325 MG tablet  Commonly known as:  PERCOCET/ROXICET  Take 1 tablet by mouth every 4 (four) hours as needed for severe pain. Reported on 12/13/2015     pravastatin 40 MG tablet  Commonly known as:  PRAVACHOL  Take 1 tablet (40 mg total) by mouth daily.        Allergies: No Known Allergies  Past Medical History  Diagnosis Date  . Sebaceous cyst     on back of neck  . Diabetes mellitus     type II  . Pulmonary embolism (HCC)   . Deep vein thrombosis (DVT) (HCC)   . Arthritis     bilateral hips  . 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 DJD (degenerative joint disease)   . Pulmonary emboli (HCC) 02/08/2014    Date of diagnosis February 08 2014, on chest CTA Hospitalized for 3 days Had some symptoms of shortness of breath, and chest pain With intercurrent DVT of the left LE. Duration of anticoagulation: 8 months. End date 10/11/2014.  Anticoagulant: Lovenox 120 units daily Switched to Eliquis on 05/25/2014 per patient preference     Past Surgical History  Procedure Laterality Date  . Rotator cuff repair  2005 (approx)    right   . Closed reduction with humer pin insertion  1974    left hip  . Joint replacement  2006 (approx)    right hip replaced  . 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 Total hip arthroplasty Left 07/21/2014    Procedure: TOTAL HIP ARTHROPLASTY ANTERIOR APPROACH;  Surgeon: Loreta Ave, MD;  Location: [facility] OR;  Service: Orthopedics;  Laterality: Left;  . Hardware removal Left 07/21/2014    Procedure: HARDWARE REMOVAL;  Surgeon: Loreta Ave, MD;  Location: [facility] OR;  Service: Orthopedics;  Laterality: Left;  . Amputation Right 06/03/2015    Procedure: Right Below Knee Amputation;  Surgeon: Nadara Mustard, MD;  Location: [facility] OR;  Service: Orthopedics;  Laterality: Right;    Family History  Problem Relation Age of Onset  . Cancer Mother     breast, colon, liver  . Diabetes Father     Social History:  reports that he has never smoked. He has never used smokeless tobacco. He reports that he drinks alcohol. He reports that he does not use illicit drugs.  Review of Systems:   Lipids: He is on Pravachol with good control of LDL with LDL 92  Lab Results  Component Value Date   CHOL 146 12/01/2015   HDL 42.50 12/01/2015   LDLCALC 92 12/01/2015   LDLDIRECT 91.0 07/11/2015   TRIG 55.0 12/01/2015   CHOLHDL 3 12/01/2015     He is still having problems with carpal tunnel on the right side and not benefiting from gabapentin prescribed by PCP     Examination:   BP 116/68 mmHg  Pulse 69  Temp(Src) 97.9 F (36.6 C)  Resp 16  Ht  (1.854 m)  Wt 205 lb 9.6 oz (93.26 kg)  BMI 27.13 kg/m2  SpO2 94%  Body mass index is 27.13 kg/(m^2).      ASSESSMENT/ PLAN:     Diabetes type 2:  See history of present illness for detailed discussion of current diabetes management, blood sugar patterns and problems identified He is getting much higher blood sugars with A1c going up to 9% This is partly related to his poor diet, previously had done excellent with low-dose insulin  as his only treatment Most likely he has gained weight from excess calories also   Discussed various ways in which he can improve his diet. He does need to monitor his blood sugars more consistently and some after meals also For now he will increase his Lantus to 25 and increase it further every 3 days if needed to keep morning blood sugars in the target range specified Discussed postprandial targets May need less mealtime insulin if improving his diet Consider metformin if needed  Review treatment in 6 weeks  He will go back to his orthopedist for  Right carpal tunnel syndrome.     Patient Instructions  Lantus 25 units   Keep am sugar in range:  Check blood sugars on waking up 4-5  times a week  Also check blood sugars about 2 hours after a meal and do this after different meals by rotation  Recommended blood sugar levels on waking up is 90-130 and about 2 hours after meal is 130-160  Please bring your blood sugar monitor to each visit, thank you    Counseling time on subjects discussed above is over 50% of today's 25 minute visit    Setareh Rom 12/13/2015, 2:40 PM   Note: This office note was prepared with Dragon voice recognition system technology. Any transcriptional errors that result from this process are unintentional.

## 2015-12-15 ENCOUNTER — Encounter: Payer: Self-pay | Admitting: Physical Therapy

## 2015-12-15 ENCOUNTER — Ambulatory Visit: Payer: Medicaid Other | Admitting: Physical Therapy

## 2015-12-15 DIAGNOSIS — M6281 Muscle weakness (generalized): Secondary | ICD-10-CM | POA: Diagnosis present

## 2015-12-15 DIAGNOSIS — R29898 Other symptoms and signs involving the musculoskeletal system: Secondary | ICD-10-CM | POA: Diagnosis present

## 2015-12-15 DIAGNOSIS — M25551 Pain in right hip: Secondary | ICD-10-CM | POA: Diagnosis present

## 2015-12-15 DIAGNOSIS — R2681 Unsteadiness on feet: Secondary | ICD-10-CM

## 2015-12-15 DIAGNOSIS — R2689 Other abnormalities of gait and mobility: Secondary | ICD-10-CM

## 2015-12-15 DIAGNOSIS — Z89611 Acquired absence of right leg above knee: Secondary | ICD-10-CM | POA: Diagnosis present

## 2015-12-16 NOTE — Therapy (Signed)
[facility] [address] [place], [place], [address] Phone: [phone]   Fax:  [phone]  Physical Therapy Treatment  Patient Details  Name: Gregory May MRN: [id number] Date of Birth: Jan 23, 1961 Referring Provider: [name], MD  Encounter Date: 12/15/2015      PT End of Session - 12/15/15 1500    Visit Number 2   Number of Visits 9   Authorization Type Medicaid   Authorization Time Period 12/08/2105 - 03/29/2016   Authorization - Visit Number 1   Authorization - Number of Visits 8   PT Start Time 1319   PT Stop Time 1400   PT Time Calculation (min) 41 min   Equipment Utilized During Treatment Gait belt   Activity Tolerance Patient tolerated treatment well   Behavior [facility] for tasks assessed/performed      Past Medical History  Diagnosis Date  . Sebaceous cyst     on back of neck  . Diabetes mellitus     type II  . Pulmonary embolism (HCC)   . Deep vein thrombosis (DVT) (HCC)   . Arthritis     bilateral hips  . 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 DJD (degenerative joint disease)   . Pulmonary emboli (HCC) 02/08/2014    Date of diagnosis February 08 2014, on chest CTA Hospitalized for 3 days Had some symptoms of shortness of breath, and chest pain With intercurrent DVT of the left LE. Duration of anticoagulation: 8 months. End date 10/11/2014.  Anticoagulant: Lovenox 120 units daily Switched to Eliquis on 05/25/2014 per patient preference     Past Surgical History  Procedure Laterality Date  . Rotator cuff repair  2005 (approx)    right   . Closed reduction with humer pin insertion  1974    left hip  . Joint replacement  2006 (approx)    right hip replaced  . 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 Total hip arthroplasty Left 07/21/2014    Procedure: TOTAL HIP ARTHROPLASTY ANTERIOR APPROACH;  Surgeon: Loreta Aveaniel F Murphy, MD;  [facility];  Service:  Orthopedics;  Laterality: Left;  . Hardware removal Left 07/21/2014    Procedure: HARDWARE REMOVAL;  Surgeon: Loreta Aveaniel F Murphy, MD;  [facility];  Service: Orthopedics;  Laterality: Left;  . Amputation Right 06/03/2015    Procedure: Right Below Knee Amputation;  Surgeon: Nadara MustardMarcus Duda V, MD;  [facility];  Service: Orthopedics;  Laterality: Right;    There were no vitals filed for this visit.      Subjective Assessment - 12/15/15 1322    Subjective wearing prosthesis once per day for 6-9 hrs. He has been drying limb & liner as recommended and it helps.    Pertinent History Left THR 07/21/2014, Right THR 2004, DM2, neuropathy, multiple joint surgeries, PE/DVT, drug abuse, arthritis, right RTC   Limitations Lifting;Standing;Walking   Patient Stated Goals to use prosthesis to get back in community.    Currently in Pain? Yes   Pain Score 6    Pain Location Hip   Pain Orientation Right;Left   Pain Descriptors / Indicators Aching;Sore;Spasm   Pain Type Chronic pain   Pain Onset More than a month ago   Pain Frequency Intermittent   Aggravating Factors  sitting or standing too long   Pain Relieving Factors sitting too long,    Multiple Pain Sites Yes   Pain Score 6  Pain Location Knee   Pain Orientation Left   Pain Descriptors / Indicators Sharp   Pain Type Acute pain   Pain Onset In the past 7 days   Pain Frequency Intermittent   Aggravating Factors  standing up using left leg more   Pain Relieving Factors standing up using both legs                         [facility] Adult PT Treatment/Exercise - 12/15/15 1315    Transfers   Transfers Sit to Stand;Stand to Sit   Sit to Stand 5: Supervision;With upper extremity assist;With armrests;From chair/3-in-1   Sit to Stand Details (indicate cue type and reason) demo & verbal cues on technique and equal use of LEs with focus on maintaining mid-line   Stand to Sit 5: Supervision   Stand to Sit Details demo & verbal cues on  technique and equal use of LEs with focus on maintaining mid-line   Ambulation/Gait   Ambulation/Gait Yes   Ambulation/Gait Assistance 4: Min guard   Ambulation/Gait Assistance Details demo & verbal cues on counter trunk rotation with cane in contralateral UE, sequencing, posture   Ambulation Distance (Feet) 150 Feet  150 with cane, 100' X 2 with single axillary crutch   Assistive device Straight cane;Prosthesis;R Axillary Crutch   Gait Pattern Step-through pattern;Decreased step length - left;Decreased stance time - right;Decreased stride length;Decreased hip/knee flexion - right;Decreased weight shift to right;Right hip hike;Right circumduction;Abducted- right;Poor foot clearance - right   Ambulation Surface Indoor;Level   Prosthetics   Prosthetic Care Comments  signs of sweating and dry limb & liner   Current prosthetic wear tolerance (days/week)  daily   Current prosthetic wear tolerance (#hours/day)  reports 6-9hrs 1x/day, PT recommended 2x/day for improved moisture control for 5hrs each wear.   Residual limb condition  wet scab at distal tibia with no signs of infection, skin over moist, no hair growth, normal temperature, color is red from pressure at pressure tolerant areas with underlying heat rash   Education Provided Skin check;Residual limb care;Prosthetic cleaning;Care of non-amputated limb;Ply sock cleaning;Correct ply sock adjustment;Proper Donning;Proper Doffing;Proper wear schedule/adjustment;Proper weight-bearing schedule/adjustment   Person(s) Educated Patient   Education Method Explanation;Demonstration;Tactile cues;Verbal cues;Handout   Education Method Verbalized understanding;Returned demonstration;Tactile cues required;Verbal cues required;Needs further instruction   Donning Prosthesis Supervision                  PT Short Term Goals - 11/24/15 1715    PT SHORT TERM GOAL #1   Title Patient verbalizes proper cleaning & demonstrates proper donning of  prosthesis. (Target Date: 4th visit after eval)   Baseline Patient is dependent in proper cleaning & donning.    Status New   PT SHORT TERM GOAL #2   Title Patient tolerates prosthesis wear >8hrs total per day without skin issues or tenderness.  (Target Date: 4th visit after eval)   Baseline Patient has worn prosthesis ~50% of 18 days since delivery. When he attempted wear for 8 hrs one day, he had redness & tenderness.    Status New   PT SHORT TERM GOAL #3   Title Patient ambulates 400' with crutches & prosthesis with cues on deviations but no balance issues noted.  (Target Date: 4th visit after eval)   Baseline Patient ambulates 9' with crutches & prosthesis with supervision with gait deviations.    Status New   PT SHORT TERM GOAL #4   Title Patient negotiates ramps &  curbs with crutches & prosthesis with supervision.  (Target Date: 4th visit after eval)   Baseline Patient is unknowlegeable in prosthesis gait on barriers.    Status New           PT Long Term Goals - 11/24/15 1715    PT LONG TERM GOAL #1   Title patient demonstrates & verbalizes proper prosthetic care to enable safe use of prosthesis. (Target Date: 8th visit after evaluation)   Baseline Patient is dependent in prosthetic care including cleaning, residual limb care, donning properly, adjusting ply socks and adjusting wear schedule.    Status New   PT LONG TERM GOAL #2   Title Patient tolerates wear of prosthesis >90% of awake hours without skin issues or limb tenderness to enable function throughout his day. (Target Date: 8th visit after evaluation)   Baseline Patient has worn prosthesis ~50% of 18 days since delivery. He attempted wear 8 hours one day but had redness & tenderness due to increase too rapid.    Status New   PT LONG TERM GOAL #3   Title Berg Balance >45/56 to indicate lower risk of falls. (Target Date: 8th visit after evaluation)   Baseline Berg Balance 29/56   Status New   PT LONG TERM GOAL #4    Title Patient ambulates >500' outdoor surfaces including grass, ramps, curbs and stairs with LRAD & prosthesis modified independent to enable community mobility. (Target Date: 8th visit after evaluation)   Baseline Patient ambulates 29' with crutches & prosthesis with supervision and gait deviations indicating fall risk.    Status New   PT LONG TERM GOAL #5   Title Patient ambulates around furniture carrying household items with LRAD & prosthesis to enable safe household function. (Target Date: 8th visit after evaluation)   Baseline Patient ambulates 22' with crutches & prosthesis with supervision and gait deviations indicating fall risk.    Status New               Plan - 12/15/15 1500    Clinical Impression Statement Patient seems to have better understanding of prosthetic care including need to wear prosthesis twice a day to limit amount of moisture and progress wear without further skin issues. He reports less knee & hip pain with standing with equal weight distribution and gait with contralateral assistive device.    Rehab Potential Good   Clinical Impairments Affecting Rehab Potential deconditioning from limited mobility over long period, multiple medical issues.   PT Frequency 1x / week   PT Duration 8 weeks   PT Treatment/Interventions ADLs/Self Care Home Management;DME Instruction;Gait training;Stair training;Functional mobility training;Therapeutic activities;Therapeutic exercise;Balance training;Neuromuscular re-education;Patient/family education;Prosthetic Training   PT Next Visit Plan review prosthetic care, prosthetic gait with cane including barriers   Consulted and Agree with Plan of Care Patient      Patient will benefit from skilled therapeutic intervention in order to improve the following deficits and impairments:  Abnormal gait, Decreased activity tolerance, Decreased balance, Decreased coordination, Decreased endurance, Decreased knowledge of use of DME, Decreased  mobility, Decreased strength, Decreased scar mobility, Pain, Prosthetic Dependency, Postural dysfunction  Visit Diagnosis: Other abnormalities of gait and mobility  Unsteadiness on feet  Muscle weakness (generalized)  Other symptoms and signs involving the musculoskeletal system  Pain in right hip     Problem List Patient Active Problem List   Diagnosis Date Noted  . Right ear pain 10/31/2015  . Healthcare maintenance 06/21/2015  . Status post below knee amputation of right lower extremity (  HCC) 06/07/2015  . Infection of right foot 06/01/2015  . Osteomyelitis (HCC) 06/01/2015  . Diabetic foot infection (HCC) 06/01/2015  . Diabetic polyneuropathy associated with type 2 diabetes mellitus (HCC) 12/09/2014  . Numbness and tingling in right hand 12/09/2014  . DJD (degenerative joint disease) of knee 07/21/2014  . Osteoarthritis, hip, bilateral 05/25/2014  . Pulmonary emboli (HCC) 02/08/2014  . DVT (deep venous thrombosis) (HCC) 02/08/2014  . Onychomycosis 10/14/2013  . Pure hypercholesterolemia 09/25/2013  . Diabetes mellitus type 2, controlled, with complications (HCC) 09/25/2013  . Diabetic foot ulcer (HCC) 09/15/2013  . Poorly controlled type 2 diabetes mellitus with complication (HCC) 09/15/1997    Fauna Neuner PT, DPT 12/16/2015, 10:58 AM  [facility] [address] [place], [place], [address] Phone: [phone]   Fax:  [phone]  Name: Gregory May MRN: [id number] Date of Birth: 03-28-1961

## 2015-12-22 ENCOUNTER — Ambulatory Visit: Payer: Medicaid Other | Admitting: Physical Therapy

## 2015-12-22 DIAGNOSIS — R29898 Other symptoms and signs involving the musculoskeletal system: Secondary | ICD-10-CM

## 2015-12-22 DIAGNOSIS — R2689 Other abnormalities of gait and mobility: Secondary | ICD-10-CM | POA: Diagnosis not present

## 2015-12-22 DIAGNOSIS — R2681 Unsteadiness on feet: Secondary | ICD-10-CM

## 2015-12-22 DIAGNOSIS — M6281 Muscle weakness (generalized): Secondary | ICD-10-CM

## 2015-12-23 ENCOUNTER — Encounter: Payer: Self-pay | Admitting: Physical Therapy

## 2015-12-23 NOTE — Therapy (Signed)
[facility] [address] [place], [place], [address] Phone: [phone]   Fax:  [phone]  Physical Therapy Treatment  Patient Details  Name: Gregory May MRN: [id number] Date of Birth: [birth year] Referring Provider: Aldean Baker, MD  Encounter Date: 12/22/2015      PT End of Session - 12/22/15 1516    Visit Number 3   Number of Visits 9   Authorization Type Medicaid   Authorization Time Period 12/08/2105 - 03/29/2016   Authorization - Visit Number 2   Authorization - Number of Visits 8   PT Start Time 1101   PT Stop Time 1148   PT Time Calculation (min) 47 min   Equipment Utilized During Treatment Gait belt   Activity Tolerance Patient tolerated treatment well   Behavior During Therapy [facility] for tasks assessed/performed      Past Medical History  Diagnosis Date  . Sebaceous cyst     on back of neck  . Diabetes mellitus     type II  . Pulmonary embolism (HCC)   . Deep vein thrombosis (DVT) (HCC)   . Arthritis     bilateral hips  . 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 DJD (degenerative joint disease)   . Pulmonary emboli (HCC) 02/08/2014    Date of diagnosis February 08 2014, on chest CTA Hospitalized for 3 days Had some symptoms of shortness of breath, and chest pain With intercurrent DVT of the left LE. Duration of anticoagulation: 8 months. End date 10/11/2014.  Anticoagulant: Lovenox 120 units daily Switched to Eliquis on 05/25/2014 per patient preference     Past Surgical History  Procedure Laterality Date  . Rotator cuff repair  2005 (approx)    right   . Closed reduction with humer pin insertion  1974    left hip  . Joint replacement  2006 (approx)    right hip replaced  . 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 Total hip arthroplasty Left 07/21/2014    Procedure: TOTAL HIP ARTHROPLASTY ANTERIOR APPROACH;  Surgeon: Loreta Ave, MD;  Location: [facility] OR;  Service:  Orthopedics;  Laterality: Left;  . Hardware removal Left 07/21/2014    Procedure: HARDWARE REMOVAL;  Surgeon: Loreta Ave, MD;  Location: [facility] OR;  Service: Orthopedics;  Laterality: Left;  . Amputation Right 06/03/2015    Procedure: Right Below Knee Amputation;  Surgeon: Nadara Mustard, MD;  Location: [facility] OR;  Service: Orthopedics;  Laterality: Right;    There were no vitals filed for this visit.      Subjective Assessment - 12/22/15 1100    Subjective He has been wearing prosthesis daily and has been removing it between twice wear but going longer than PT recommended and shorter breaks between. Limb itches when he takes prosthesis off.    Pertinent History Left THR 07/21/2014, Right THR 2004, DM2, neuropathy, multiple joint surgeries, PE/DVT, drug abuse, arthritis, right RTC   Limitations Lifting;Standing;Walking   Patient Stated Goals to use prosthesis to get back in community.    Currently in Pain? Yes   Pain Score 5    Pain Location Hip   Pain Orientation Left;Right   Pain Descriptors / Indicators Aching;Sore;Spasm   Pain Type Chronic pain   Pain Onset More than a month ago   Pain Frequency Intermittent   Aggravating Factors  sitting or standing too long   Pain Relieving Factors sitting to rest  Pain Score 5   Pain Location Knee   Pain Orientation Left   Pain Descriptors / Indicators Sharp   Pain Onset 1 to 4 weeks ago   Pain Frequency Intermittent   Aggravating Factors  standing up incorrectly   Pain Relieving Factors standing up correctly                         [facility]/Exercise - 12/22/15 1100    Transfers   Transfers Sit to Stand;Stand to Sit   Sit to Stand 5: Supervision;With upper extremity assist;With armrests;From chair/3-in-1   Sit to Stand Details (indicate cue type and reason) verbal cues on equal use of LEs   Stand to Sit 5: Supervision;With upper extremity assist;With armrests;To chair/3-in-1   Stand to Sit Details cues on  use of bilateral LEs   Ambulation/Gait   Ambulation/Gait Yes   Ambulation/Gait Assistance 5: Supervision   Ambulation/Gait Assistance Details cues on sequence, posture, step length; benefits of assistive device to reduce stress on joints & pain management. Worked on scanning and maintaining path.   Ambulation Distance (Feet) 200 Feet  200' X 2   Assistive device Straight cane;Prosthesis   Gait Pattern Step-through pattern;Decreased step length - left;Decreased stance time - right;Decreased stride length;Decreased hip/knee flexion - right;Decreased weight shift to right;Right hip hike;Right circumduction;Abducted- right;Poor foot clearance - right   Ambulation Surface Indoor;Level   Stairs Yes   Stairs Assistance 5: Supervision   Stairs Assistance Details (indicate cue type and reason) verbal cues on modified sequence decreasing Lt knee stress and prosthesis placement for knee control   Stair Management Technique Two rails;One rail Right;With cane;Step to pattern;Forwards  modified with prosthesis "good" to protect left knee   Number of Stairs 4  5 reps   Height of Stairs 6   Ramp 4: Min assist  cane & prosthesis    Ramp Details (indicate cue type and reason) verbal & tactile cues on technique   Curb 4: Min assist  cane & prosthesis   Curb Details (indicate cue type and reason) verbal & tactile cues on technique   Prosthetics   Prosthetic Care Comments  signs of sweating and dry limb & liner; use of anti-perspirant; wiping limb with warm cloth or dry cloth to minimize need to scratch limb    Current prosthetic wear tolerance (days/week)  daily   Current prosthetic wear tolerance (#hours/day)  PT recommended 3-4hrs wear then 1hr off rotating during awake hours initiating first wear upon arising   Residual limb condition  wet scab at distal tibia with no signs of infection, skin over moist, no hair growth, normal temperature, color is red from pressure at pressure tolerant areas with  underlying heat rash   Education Provided Skin check;Residual limb care;Prosthetic cleaning;Care of non-amputated limb;Ply sock cleaning;Correct ply sock adjustment;Proper Donning;Proper Doffing;Proper wear schedule/adjustment;Proper weight-bearing schedule/adjustment  see prosthetic care comments   Person(s) Educated Patient   Education Method Explanation;Demonstration;Tactile cues;Verbal cues;Handout   Education Method Verbalized understanding;Returned demonstration;Tactile cues required;Verbal cues required;Needs further instruction   Donning Prosthesis Supervision   Doffing Prosthesis Modified independent (device/increased time)                  PT Short Term Goals - 11/24/15 1715    PT SHORT TERM GOAL #1   Title Patient verbalizes proper cleaning & demonstrates proper donning of prosthesis. (Target Date: 4th visit after eval)   Baseline Patient is dependent in proper cleaning & donning.  Status New   PT SHORT TERM GOAL #2   Title Patient tolerates prosthesis wear >8hrs total per day without skin issues or tenderness.  (Target Date: 4th visit after eval)   Baseline Patient has worn prosthesis ~50% of 18 days since delivery. When he attempted wear for 8 hrs one day, he had redness & tenderness.    Status New   PT SHORT TERM GOAL #3   Title Patient ambulates 400' with crutches & prosthesis with cues on deviations but no balance issues noted.  (Target Date: 4th visit after eval)   Baseline Patient ambulates 57' with crutches & prosthesis with supervision with gait deviations.    Status New   PT SHORT TERM GOAL #4   Title Patient negotiates ramps & curbs with crutches & prosthesis with supervision.  (Target Date: 4th visit after eval)   Baseline Patient is unknowlegeable in prosthesis gait on barriers.    Status New           PT Long Term Goals - 11/24/15 1715    PT LONG TERM GOAL #1   Title patient demonstrates & verbalizes proper prosthetic care to enable safe use of  prosthesis. (Target Date: 8th visit after evaluation)   Baseline Patient is dependent in prosthetic care including cleaning, residual limb care, donning properly, adjusting ply socks and adjusting wear schedule.    Status New   PT LONG TERM GOAL #2   Title Patient tolerates wear of prosthesis >90% of awake hours without skin issues or limb tenderness to enable function throughout his day. (Target Date: 8th visit after evaluation)   Baseline Patient has worn prosthesis ~50% of 18 days since delivery. He attempted wear 8 hours one day but had redness & tenderness due to increase too rapid.    Status New   PT LONG TERM GOAL #3   Title Berg Balance >45/56 to indicate lower risk of falls. (Target Date: 8th visit after evaluation)   Baseline Berg Balance 29/56   Status New   PT LONG TERM GOAL #4   Title Patient ambulates >500' outdoor surfaces including grass, ramps, curbs and stairs with LRAD & prosthesis modified independent to enable community mobility. (Target Date: 8th visit after evaluation)   Baseline Patient ambulates 72' with crutches & prosthesis with supervision and gait deviations indicating fall risk.    Status New   PT LONG TERM GOAL #5   Title Patient ambulates around furniture carrying household items with LRAD & prosthesis to enable safe household function. (Target Date: 8th visit after evaluation)   Baseline Patient ambulates 91' with crutches & prosthesis with supervision and gait deviations indicating fall risk.    Status New               Plan - 12/22/15 1517    Clinical Impression Statement Patient's skin has heat rash appearance indicating wearing prosthesis too long initially. Patient's gait with cane improved with skilled instruction.    Rehab Potential Good   Clinical Impairments Affecting Rehab Potential deconditioning from limited mobility over long period, multiple medical issues.   PT Frequency 1x / week   PT Duration 8 weeks   PT Treatment/Interventions  ADLs/Self Care Home Management;DME Instruction;Gait training;Stair training;Functional mobility training;Therapeutic activities;Therapeutic exercise;Balance training;Neuromuscular re-education;Patient/family education;Prosthetic Training   PT Next Visit Plan review prosthetic care, prosthetic gait with cane including barriers   Consulted and Agree with Plan of Care Patient      Patient will benefit from skilled therapeutic intervention in order to improve  the following deficits and impairments:  Abnormal gait, Decreased activity tolerance, Decreased balance, Decreased coordination, Decreased endurance, Decreased knowledge of use of DME, Decreased mobility, Decreased strength, Decreased scar mobility, Pain, Prosthetic Dependency, Postural dysfunction  Visit Diagnosis: Other abnormalities of gait and mobility  Unsteadiness on feet  Muscle weakness (generalized)  Other symptoms and signs involving the musculoskeletal system     Problem List Patient Active Problem List   Diagnosis Date Noted  . Right ear pain 10/31/2015  . Healthcare maintenance 06/21/2015  . Status post below knee amputation of right lower extremity (HCC) 06/07/2015  . Infection of right foot 06/01/2015  . Osteomyelitis (HCC) 06/01/2015  . Diabetic foot infection (HCC) 06/01/2015  . Diabetic polyneuropathy associated with type 2 diabetes mellitus (HCC) 12/09/2014  . Numbness and tingling in right hand 12/09/2014  . DJD (degenerative joint disease) of knee 07/21/2014  . Osteoarthritis, hip, bilateral 05/25/2014  . Pulmonary emboli (HCC) 02/08/2014  . DVT (deep venous thrombosis) (HCC) 02/08/2014  . Onychomycosis 10/14/2013  . Pure hypercholesterolemia 09/25/2013  . Diabetes mellitus type 2, controlled, with complications (HCC) 09/25/2013  . Diabetic foot ulcer (HCC) 09/15/2013  . Poorly controlled type 2 diabetes mellitus with complication (HCC) 09/15/1997    Janille Draughon PT, DPT 12/23/2015, 3:22 PM  [facility] [address] [place], [place], [address] Phone: [phone]   Fax:  [phone]  Name: KEANO GUGGENHEIM MRN: [id number] Date of Birth: 07-01-1961

## 2015-12-28 ENCOUNTER — Encounter: Payer: Self-pay | Admitting: Physical Therapy

## 2015-12-28 ENCOUNTER — Ambulatory Visit: Payer: Medicaid Other | Admitting: Physical Therapy

## 2015-12-28 DIAGNOSIS — R2689 Other abnormalities of gait and mobility: Secondary | ICD-10-CM

## 2015-12-28 DIAGNOSIS — S78111A Complete traumatic amputation at level between right hip and knee, initial encounter: Secondary | ICD-10-CM

## 2015-12-28 DIAGNOSIS — R2681 Unsteadiness on feet: Secondary | ICD-10-CM

## 2015-12-28 DIAGNOSIS — M25551 Pain in right hip: Secondary | ICD-10-CM

## 2015-12-28 DIAGNOSIS — M6281 Muscle weakness (generalized): Secondary | ICD-10-CM

## 2015-12-28 NOTE — Therapy (Signed)
[facility] [address] [place], [place], [address] Phone: [phone]   Fax:  [phone]  Physical Therapy Treatment  Patient Details  Name: Gregory May MRN: [id number] Date of Birth: 05/03/1961 Referring Provider: Aldean Baker, MD  Encounter Date: 12/28/2015      PT End of Session - 12/28/15 1241    Visit Number 4   Number of Visits 9   Authorization Type Medicaid   Authorization Time Period 12/08/2105 - 03/29/2016   Authorization - Visit Number 3   Authorization - Number of Visits 8   PT Start Time 1240   PT Stop Time 1320   PT Time Calculation (min) 40 min   Equipment Utilized During Treatment Gait belt   Activity Tolerance Patient tolerated treatment well;No increased pain   Behavior During Therapy [facility] for tasks assessed/performed      Past Medical History  Diagnosis Date  . Sebaceous cyst     on back of neck  . Diabetes mellitus     type II  . Pulmonary embolism (HCC)   . Deep vein thrombosis (DVT) (HCC)   . Arthritis     bilateral hips  . 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 DJD (degenerative joint disease)   . Pulmonary emboli (HCC) 02/08/2014    Date of diagnosis February 08 2014, on chest CTA Hospitalized for 3 days Had some symptoms of shortness of breath, and chest pain With intercurrent DVT of the left LE. Duration of anticoagulation: 8 months. End date 10/11/2014.  Anticoagulant: Lovenox 120 units daily Switched to Eliquis on 05/25/2014 per patient preference     Past Surgical History  Procedure Laterality Date  . Rotator cuff repair  2005 (approx)    right   . Closed reduction with humer pin insertion  1974    left hip  . Joint replacement  2006 (approx)    right hip replaced  . 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 Total hip arthroplasty Left 07/21/2014    Procedure: TOTAL HIP ARTHROPLASTY ANTERIOR APPROACH;  Surgeon: Loreta Ave, MD;  Location: [facility] OR;   Service: Orthopedics;  Laterality: Left;  . Hardware removal Left 07/21/2014    Procedure: HARDWARE REMOVAL;  Surgeon: Loreta Ave, MD;  Location: [facility] OR;  Service: Orthopedics;  Laterality: Left;  . Amputation Right 06/03/2015    Procedure: Right Below Knee Amputation;  Surgeon: Nadara Mustard, MD;  Location: [facility] OR;  Service: Orthopedics;  Laterality: Right;    There were no vitals filed for this visit.      Subjective Assessment - 12/28/15 1242    Subjective No falls since last seen but has noticed balance changes in the past 2 days, expresses concerns about skin specifically at wound on back of knee, patient is moving back to old house, brother (mike) wil be in and out sometimes "there is alot of clutter there"   Pertinent History Left THR 07/21/2014, Right THR 2004, DM2, neuropathy, multiple joint surgeries, PE/DVT, drug abuse, arthritis, right RTC   Limitations Lifting;Standing;Walking   Patient Stated Goals to use prosthesis to get back in community.    Currently in Pain? No/denies                         [facility] Adult PT Treatment/Exercise - 12/28/15 1240    Transfers   Transfers Sit to Stand;Stand to Sit  Sit to Stand 5: Supervision;With upper extremity assist;With armrests;From chair/3-in-1   Stand to Sit 5: Supervision;With upper extremity assist;With armrests;To chair/3-in-1   Ambulation/Gait   Ambulation/Gait Yes   Ambulation/Gait Assistance 5: Supervision   Ambulation/Gait Assistance Details verbal cues for prosthetic and AD managment   Ambulation Distance (Feet) 355 Feet   Assistive device Straight cane;Prosthesis   Gait Pattern Step-through pattern;Decreased step length - left;Decreased stance time - right;Decreased stride length;Decreased hip/knee flexion - right;Decreased weight shift to right;Abducted- right;Poor foot clearance - right   Ambulation Surface Level;Indoor   Stairs Yes   Stairs Assistance 5: Supervision   Stairs Assistance Details  (indicate cue type and reason) prothesis placement when descending stairs   Stair Management Technique One rail Right;With cane;Forwards;Step to pattern  modified with prosthesis "good" to protect left knee   Number of Stairs 4   Height of Stairs 6   Ramp 5: Supervision  cane & prosthesis    Ramp Details (indicate cue type and reason) cues on technique including step length   Curb 5: Supervision  cane & prosthesis   Curb Details (indicate cue type and reason) cues on technique   High Level Balance   High Level Balance Activities Figure 8 turns;Negotitating around obstacles;Negotiating over obstacles;Side stepping;Backward walking   High Level Balance Comments verbal cues & demo for proper technique and balance reactions with prosthesis   Prosthetics   Prosthetic Care Comments  rubbing leg with warm cloth or dry cloth to minimize need to scratch limb, review of donning/dofifn giwthout scratching liner with nails   Current prosthetic wear tolerance (days/week)  daily   Current prosthetic wear tolerance (#hours/day)  currently wearing 3-4 hrs on 1 hr off; PT increased to 4-5 hrs on prosthesis off 1 hr during awake hours; Needs to be off overnight for at least 6 hours.    Residual limb condition  dry scab at distal tibia, open superficial wound on posterior limb, no drainage and shows signs of healing with no signs of infection   Education Provided Residual limb care;Proper Donning;Proper Doffing;Proper wear schedule/adjustment;Proper weight-bearing schedule/adjustment;Skin check  see prosthetic care comments   Person(s) Educated Patient   Education Method Explanation;Demonstration;Tactile cues;Verbal cues   Education Method Verbalized understanding;Returned demonstration;Tactile cues required;Verbal cues required;Needs further instruction   Donning Prosthesis Supervision   Doffing Prosthesis Modified independent (device/increased time)                PT Education - 12/28/15 1254     Education provided Yes   Education Details (p) increasing walking with prosthesis on in the home   Person(s) Educated Patient   Methods Explanation;Demonstration   Comprehension Verbalized understanding          PT Short Term Goals - 12/28/15 2226    PT SHORT TERM GOAL #1   Title Patient verbalizes proper cleaning & demonstrates proper donning of prosthesis. (Target Date: 4th visit after eval)   Baseline Patient is dependent in proper cleaning & donning.    Status On-going   PT SHORT TERM GOAL #2   Title Patient tolerates prosthesis wear >8hrs total per day without skin issues or tenderness.  (Target Date: 4th visit after eval)   Baseline Patient has worn prosthesis ~50% of 18 days since delivery. When he attempted wear for 8 hrs one day, he had redness & tenderness.    Status On-going   PT SHORT TERM GOAL #3   Title Patient ambulates 400' with crutches & prosthesis with cues on deviations but no  balance issues noted.  (Target Date: 4th visit after eval)   Baseline Patient ambulates 10' with crutches & prosthesis with supervision with gait deviations.    Status On-going   PT SHORT TERM GOAL #4   Title Patient negotiates ramps & curbs with crutches & prosthesis with supervision.  (Target Date: 4th visit after eval)   Baseline Patient is unknowlegeable in prosthesis gait on barriers.    Status On-going           PT Long Term Goals - 12/28/15 2226    PT LONG TERM GOAL #1   Title patient demonstrates & verbalizes proper prosthetic care to enable safe use of prosthesis. (Target Date: 8th visit after evaluation)   Baseline Patient is dependent in prosthetic care including cleaning, residual limb care, donning properly, adjusting ply socks and adjusting wear schedule.    Status On-going   PT LONG TERM GOAL #2   Title Patient tolerates wear of prosthesis >90% of awake hours without skin issues or limb tenderness to enable function throughout his day. (Target Date: 8th visit after  evaluation)   Baseline Patient has worn prosthesis ~50% of 18 days since delivery. He attempted wear 8 hours one day but had redness & tenderness due to increase too rapid.    Status On-going   PT LONG TERM GOAL #3   Title Berg Balance >45/56 to indicate lower risk of falls. (Target Date: 8th visit after evaluation)   Baseline Berg Balance 29/56   Status On-going   PT LONG TERM GOAL #4   Title Patient ambulates >500' outdoor surfaces including grass, ramps, curbs and stairs with LRAD & prosthesis modified independent to enable community mobility. (Target Date: 8th visit after evaluation)   Baseline Patient ambulates 59' with crutches & prosthesis with supervision and gait deviations indicating fall risk.    Status On-going   PT LONG TERM GOAL #5   Title Patient ambulates around furniture carrying household items with LRAD & prosthesis to enable safe household function. (Target Date: 8th visit after evaluation)   Baseline Patient ambulates 75' with crutches & prosthesis with supervision and gait deviations indicating fall risk.    Status On-going               Plan - 12/28/15 1255    Clinical Impression Statement Pts wounds on residual limb show signs of healing, pt demostrates proper donning/doffing of prosthesis with minimal cuing. Pt is improving with confidence with prosthesis and gait is improving he is able to nagivate obstacles and perform head turns with supervision and no loss of balance.    Rehab Potential Good   Clinical Impairments Affecting Rehab Potential deconditioning from limited mobility over long period, multiple medical issues.   PT Frequency 1x / week   PT Duration 8 weeks   PT Treatment/Interventions ADLs/Self Care Home Management;DME Instruction;Gait training;Stair training;Functional mobility training;Therapeutic activities;Therapeutic exercise;Balance training;Neuromuscular re-education;Patient/family education;Prosthetic Training   PT Next Visit Plan check  STGs, prosthesis gait on unlevel surfaces   Consulted and Agree with Plan of Care Patient      Patient will benefit from skilled therapeutic intervention in order to improve the following deficits and impairments:  Abnormal gait, Decreased activity tolerance, Decreased balance, Decreased coordination, Decreased endurance, Decreased knowledge of use of DME, Decreased mobility, Decreased strength, Decreased scar mobility, Pain, Prosthetic Dependency, Postural dysfunction  Visit Diagnosis: Other abnormalities of gait and mobility  Unsteadiness on feet  Muscle weakness (generalized)  Pain in right hip  Above knee amputation of right  lower extremity [facility])     Problem List Patient Active Problem List   Diagnosis Date Noted  . Right ear pain 10/31/2015  . Healthcare maintenance 06/21/2015  . Status post below knee amputation of right lower extremity (HCC) 06/07/2015  . Infection of right foot 06/01/2015  . Osteomyelitis (HCC) 06/01/2015  . Diabetic foot infection (HCC) 06/01/2015  . Diabetic polyneuropathy associated with type 2 diabetes mellitus (HCC) 12/09/2014  . Numbness and tingling in right hand 12/09/2014  . DJD (degenerative joint disease) of knee 07/21/2014  . Osteoarthritis, hip, bilateral 05/25/2014  . Pulmonary emboli (HCC) 02/08/2014  . DVT (deep venous thrombosis) (HCC) 02/08/2014  . Onychomycosis 10/14/2013  . Pure hypercholesterolemia 09/25/2013  . Diabetes mellitus type 2, controlled, with complications (HCC) 09/25/2013  . Diabetic foot ulcer (HCC) 09/15/2013  . Poorly controlled type 2 diabetes mellitus with complication (HCC) 09/15/1997   Rollene Fare, SPT  Vladimir Faster, PT, DPT 12/28/2015, 10:27 PM  [facility] [address] [place], [place], [address] Phone: [phone]   Fax:  [phone]  Name: Gregory May MRN: [id number] Date of Birth: Sep 07, 1960

## 2016-01-03 ENCOUNTER — Ambulatory Visit: Payer: Medicaid Other | Admitting: Physical Therapy

## 2016-01-03 ENCOUNTER — Encounter: Payer: Self-pay | Admitting: Physical Therapy

## 2016-01-03 DIAGNOSIS — M25551 Pain in right hip: Secondary | ICD-10-CM

## 2016-01-03 DIAGNOSIS — R2681 Unsteadiness on feet: Secondary | ICD-10-CM

## 2016-01-03 DIAGNOSIS — M6281 Muscle weakness (generalized): Secondary | ICD-10-CM

## 2016-01-03 DIAGNOSIS — R2689 Other abnormalities of gait and mobility: Secondary | ICD-10-CM | POA: Diagnosis not present

## 2016-01-04 NOTE — Therapy (Signed)
[facility] [address] [place], [place], [address] Phone: [phone]   Fax:  [phone]  Physical Therapy Treatment  Patient Details  Name: Gregory May MRN: [id number] Date of Birth: January 29, 1961 Referring Provider: Meridee Score, MD  Encounter Date: 01/03/2016      PT End of Session - 01/03/16 1600    Visit Number 5   Number of Visits 9   Authorization Type Medicaid   Authorization Time Period 12/08/2105 - 03/29/2016   Authorization - Visit Number 4   Authorization - Number of Visits 8   PT Start Time 1093   PT Stop Time 1620   PT Time Calculation (min) 47 min   Equipment Utilized During Treatment Gait belt   Activity Tolerance Patient tolerated treatment well;No increased pain   Behavior During Therapy [facility] for tasks assessed/performed      Past Medical History  Diagnosis Date  . Sebaceous cyst     on back of neck  . Diabetes mellitus     type II  . Pulmonary embolism ([place])   . Deep vein thrombosis (DVT) ([place])   . Arthritis     bilateral hips  . 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 DJD (degenerative joint disease)   . Pulmonary emboli ([place]) 02/08/2014    Date of diagnosis February 08 2014, on chest CTA Hospitalized for 3 days Had some symptoms of shortness of breath, and chest pain With intercurrent DVT of the left LE. Duration of anticoagulation: 8 months. End date 10/11/2014.  Anticoagulant: Lovenox 120 units daily Switched to Eliquis on 05/25/2014 per patient preference     Past Surgical History  Procedure Laterality Date  . Rotator cuff repair  2005 (approx)    right   . Closed reduction with humer pin insertion  1974    left hip  . Joint replacement  2006 (approx)    right hip replaced  . 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 Total hip arthroplasty Left 07/21/2014    Procedure: TOTAL HIP ARTHROPLASTY ANTERIOR APPROACH;  Surgeon: Ninetta Lights, MD;  Location: [facility];   Service: Orthopedics;  Laterality: Left;  . Hardware removal Left 07/21/2014    Procedure: HARDWARE REMOVAL;  Surgeon: Ninetta Lights, MD;  Location: [facility];  Service: Orthopedics;  Laterality: Left;  . Amputation Right 06/03/2015    Procedure: Right Below Knee Amputation;  Surgeon: [name], MD;  Location: [facility];  Service: Orthopedics;  Laterality: Right;    There were no vitals filed for this visit.      Subjective Assessment - 01/03/16 1537    Subjective He moved back home with his brother. No issues or falls. Wearing prosthesis 4-5 hours off 1-2 hours depending on issues.    Pertinent History Left THR 07/21/2014, Right THR 2004, DM2, neuropathy, multiple joint surgeries, PE/DVT, drug abuse, arthritis, right RTC   Limitations Lifting;Standing;Walking   Patient Stated Goals to use prosthesis to get back in community.    Currently in Pain? No/denies         Pt was instructed to wear the right prosthesisAll awake hours except 1-2 hours midday and drying mid-wear morning & evening  in order to improve wear tolerance.   Patient demonstrates proper cleaning and donning. PT did instruct in pistoning created by residual limb seating deeper in socket with shrinkage and not properly adjusting ply socks. PT instructed in proper sock adjustment  and pt verbalized understanding.  Prosthetic Training: Sit to stand with  Supervision  from chairs with armrests without object to stabilize initially. Stand to sit with  Supervision  to chairs with armrests without object to stabilize. Patient ambulated with Modified Independent Device: Crutches & prosthesis  Distance: 400' on Indoor  Level surface.  Patient ambulated with Supervision Device: No device & prosthesis  Distance: 200' on Indoor  Level surface.  Multi-modal cues for deviations: posture, step width & step length Patient negotiated ramp with Min. Assist Device: No device & prosthesis cues on technique Patient negotiated curb with Min.  Assist Device: No device & prosthesis cues on technique Patient negotiated stairs with Supervision Device: One handrail & prosthesis reciprocal pattern cues on technique  PT demo picking up objects from floor with prosthesis with 5# dumbbell & 15# box. patient return demo understanding with supervision with verbal cues for safety. PT demo ADLs in kitchen including moving hot pots from stove top, containers in / out oven, and clothes in/out dryer. Pt return demo understanding. PT demo proper foot position & weight shifts with sweeping & mopping and pt return demo understanding.                              PT Short Term Goals - 01/04/16 1026    PT SHORT TERM GOAL #1   Title Patient verbalizes proper cleaning & demonstrates proper donning of prosthesis. (Target Date: 4th visit after eval)   Baseline MET 01/03/2016   Status Achieved   PT SHORT TERM GOAL #2   Title Patient tolerates prosthesis wear >8hrs total per day without skin issues or tenderness.  (Target Date: 4th visit after eval)   Baseline MET 01/03/2016   Status Achieved   PT SHORT TERM GOAL #3   Title Patient ambulates 400' with crutches & prosthesis with cues on deviations but no balance issues noted.  (Target Date: 4th visit after eval)   Baseline MET 01/03/2016   Status Achieved   PT SHORT TERM GOAL #4   Title Patient negotiates ramps & curbs with crutches & prosthesis with supervision.  (Target Date: 4th visit after eval)   Baseline MET 01/03/2016   Status Achieved           PT Long Term Goals - 12/28/15 2226    PT LONG TERM GOAL #1   Title patient demonstrates & verbalizes proper prosthetic care to enable safe use of prosthesis. (Target Date: 8th visit after evaluation)   Baseline Patient is dependent in prosthetic care including cleaning, residual limb care, donning properly, adjusting ply socks and adjusting wear schedule.    Status On-going   PT LONG TERM GOAL #2   Title Patient tolerates wear  of prosthesis >90% of awake hours without skin issues or limb tenderness to enable function throughout his day. (Target Date: 8th visit after evaluation)   Baseline Patient has worn prosthesis ~50% of 18 days since delivery. He attempted wear 8 hours one day but had redness & tenderness due to increase too rapid.    Status On-going   PT LONG TERM GOAL #3   Title Berg Balance >45/56 to indicate lower risk of falls. (Target Date: 8th visit after evaluation)   Baseline Berg Balance 29/56   Status On-going   PT LONG TERM GOAL #4   Title Patient ambulates >500' outdoor surfaces including grass, ramps, curbs and stairs with LRAD & prosthesis modified independent to enable community  mobility. (Target Date: 8th visit after evaluation)   Baseline Patient ambulates 48' with crutches & prosthesis with supervision and gait deviations indicating fall risk.    Status On-going   PT LONG TERM GOAL #5   Title Patient ambulates around furniture carrying household items with LRAD & prosthesis to enable safe household function. (Target Date: 8th visit after evaluation)   Baseline Patient ambulates 52' with crutches & prosthesis with supervision and gait deviations indicating fall risk.    Status On-going               Plan - 01/03/16 1600    Clinical Impression Statement Patient met all STGs set for this certification period. He has increased activity level and has less pain in knee on non-amputated leg. He verbalizes a general understanding to ADLs including lifitng & carrying technique with a prosthesis. Patient is on target to meet LTGs.    Rehab Potential Good   Clinical Impairments Affecting Rehab Potential deconditioning from limited mobility over long period, multiple medical issues.   PT Frequency 1x / week   PT Duration 8 weeks   PT Treatment/Interventions ADLs/Self Care Home Management;DME Instruction;Gait training;Stair training;Functional mobility training;Therapeutic activities;Therapeutic  exercise;Balance training;Neuromuscular re-education;Patient/family education;Prosthetic Training   PT Next Visit Plan prosthetic gait on unlevel surfaces   Consulted and Agree with Plan of Care Patient      Patient will benefit from skilled therapeutic intervention in order to improve the following deficits and impairments:  Abnormal gait, Decreased activity tolerance, Decreased balance, Decreased coordination, Decreased endurance, Decreased knowledge of use of DME, Decreased mobility, Decreased strength, Decreased scar mobility, Pain, Prosthetic Dependency, Postural dysfunction  Visit Diagnosis: Other abnormalities of gait and mobility  Unsteadiness on feet  Muscle weakness (generalized)  Pain in right hip     Problem List Patient Active Problem List   Diagnosis Date Noted  . Right ear pain 10/31/2015  . Healthcare maintenance 06/21/2015  . Status post below knee amputation of right lower extremity ([place]) 06/07/2015  . Infection of right foot 06/01/2015  . Osteomyelitis ([place]) 06/01/2015  . Diabetic foot infection ([place]) 06/01/2015  . Diabetic polyneuropathy associated with type 2 diabetes mellitus ([place]) 12/09/2014  . Numbness and tingling in right hand 12/09/2014  . DJD (degenerative joint disease) of knee 07/21/2014  . Osteoarthritis, hip, bilateral 05/25/2014  . Pulmonary emboli ([place]) 02/08/2014  . DVT (deep venous thrombosis) ([place]) 02/08/2014  . Onychomycosis 10/14/2013  . Pure hypercholesterolemia 09/25/2013  . Diabetes mellitus type 2, controlled, with complications ([place]) 34/28/7681  . Diabetic foot ulcer ([place]) 09/15/2013  . Poorly controlled type 2 diabetes mellitus with complication ([place]) 15/72/6203    Prentice Sackrider PT, DPT 01/04/2016, 10:37 AM  [facility] [address] [place], [place], [address] Phone: [phone]   Fax:  [phone]  Name: BARAA TUBBS MRN: [id number] Date of Birth:  May 29, 1961

## 2016-01-10 ENCOUNTER — Ambulatory Visit: Payer: Medicaid Other | Admitting: Physical Therapy

## 2016-01-10 DIAGNOSIS — R2689 Other abnormalities of gait and mobility: Secondary | ICD-10-CM | POA: Diagnosis not present

## 2016-01-10 DIAGNOSIS — R2681 Unsteadiness on feet: Secondary | ICD-10-CM

## 2016-01-10 DIAGNOSIS — M6281 Muscle weakness (generalized): Secondary | ICD-10-CM

## 2016-01-10 DIAGNOSIS — R29898 Other symptoms and signs involving the musculoskeletal system: Secondary | ICD-10-CM

## 2016-01-10 NOTE — Therapy (Signed)
[facility] [address] [place], [place], [address] Phone: [phone]   Fax:  [phone]  Physical Therapy Treatment  Patient Details  Name: Gregory May MRN: [id number] Date of Birth: 11/05/1960 Referring Provider: Meridee Score, MD  Encounter Date: 01/10/2016      PT End of Session - 01/10/16 1554    Visit Number 6   Number of Visits 9   Authorization Type Medicaid   Authorization Time Period 12/08/2105 - 03/29/2016   Authorization - Visit Number 5   Authorization - Number of Visits 8   PT Start Time 8921   PT Stop Time 1535   PT Time Calculation (min) 43 min   Equipment Utilized During Treatment Gait belt   Activity Tolerance Patient tolerated treatment well;No increased pain   Behavior During Therapy [facility] for tasks assessed/performed      Past Medical History  Diagnosis Date  . Sebaceous cyst     on back of neck  . Diabetes mellitus     type II  . Pulmonary embolism ([place])   . Deep vein thrombosis (DVT) ([place])   . Arthritis     bilateral hips  . 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 DJD (degenerative joint disease)   . Pulmonary emboli ([place]) 02/08/2014    Date of diagnosis February 08 2014, on chest CTA Hospitalized for 3 days Had some symptoms of shortness of breath, and chest pain With intercurrent DVT of the left LE. Duration of anticoagulation: 8 months. End date 10/11/2014.  Anticoagulant: Lovenox 120 units daily Switched to Eliquis on 05/25/2014 per patient preference     Past Surgical History  Procedure Laterality Date  . Rotator cuff repair  2005 (approx)    right   . Closed reduction with humer pin insertion  1974    left hip  . Joint replacement  2006 (approx)    right hip replaced  . 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 Total hip arthroplasty Left 07/21/2014    Procedure: TOTAL HIP ARTHROPLASTY ANTERIOR APPROACH;  Surgeon: Ninetta Lights, MD;  Location: [facility];   Service: Orthopedics;  Laterality: Left;  . Hardware removal Left 07/21/2014    Procedure: HARDWARE REMOVAL;  Surgeon: Ninetta Lights, MD;  Location: [facility];  Service: Orthopedics;  Laterality: Left;  . Amputation Right 06/03/2015    Procedure: Right Below Knee Amputation;  Surgeon: [name], MD;  Location: [facility];  Service: Orthopedics;  Laterality: Right;    There were no vitals filed for this visit.      Subjective Assessment - 01/10/16 1456    Subjective No mobility issues or falls since in the new home. Wearing all awake hours, drying once during the day.    Pertinent History Left THR 07/21/2014, Right THR 2004, DM2, neuropathy, multiple joint surgeries, PE/DVT, drug abuse, arthritis, right RTC   Limitations Lifting;Standing;Walking   Patient Stated Goals to use prosthesis to get back in community.    Currently in Pain? No/denies             Prosthetics Assessment - 01/10/16 1505    Prosthetics   Donning prosthesis  --   Doffing prosthesis  --                    [facility] Adult PT Treatment/Exercise - 01/10/16 1505    Transfers   Transfers Sit to Stand;Stand to Sit;Floor to  Transfer   Sit to Stand 6: Modified independent (Device/Increase time);With armrests;From chair/3-in-1   Stand to Sit 6: Modified independent (Device/Increase time);To chair/3-in-1;With upper extremity assist   Floor to Transfer 5: Supervision   Floor to Transfer Details (indicate cue type and reason) Cues for sequencing and safety, trailed with prosthetic knee down first and non amputated knee down first, pt preferred with prosthetic knee down first   Ambulation/Gait   Ambulation/Gait Yes   Ambulation/Gait Assistance 5: Supervision   Ambulation/Gait Assistance Details Pt able to ambulate on outdoors through grass on ramps, curbs with supervision and no loss of balance. Pt demostrated decreased endurance and was sweating and felt dizzy after coming inside.   Ambulation Distance (Feet) 1327  Feet  1327x1- outdoors 200'-indoors   Assistive device Straight cane;Prosthesis   Gait Pattern Step-through pattern;Decreased step length - left;Decreased stance time - right;Decreased stride length;Decreased hip/knee flexion - right;Decreased weight shift to right;Abducted- right;Poor foot clearance - right   Ambulation Surface Unlevel;Outdoor;Level;Indoor   Stairs Yes   Stairs Assistance 6: Modified independent (Device/Increase time)   Stair Management Technique One rail Right;With cane;Forwards;Step to pattern  modified with prosthesis "good" to protect left knee   Number of Stairs 4   Height of Stairs 6   Ramp 5: Supervision  cane & prosthesis    Curb 5: Supervision  cane & prosthesis   Curb Details (indicate cue type and reason) prosthetic foot placement on stepping down   High Level Balance   High Level Balance Activities --   High Level Balance Comments --   Self-Care   Self-Care --   ADL's --   Other Self-Care Comments  --   Prosthetics   Prosthetic Care Comments  reviewed signs of sweating, dry with any sign of sweat, minimize sock layers   Current prosthetic wear tolerance (days/week)  daily   Current prosthetic wear tolerance (#hours/day)  wearing all awake hours, drying once every 6 hours was educated to dry at least two times a day and as needed.   Residual limb condition  sweating and red, heat rash    Education Provided Residual limb care;Proper Donning;Proper Doffing;Proper wear schedule/adjustment;Proper weight-bearing schedule/adjustment;Skin check;Ply sock cleaning;Correct ply sock adjustment  see prosthetic care comments   Person(s) Educated Patient   Education Method Explanation;Demonstration   Education Method Verbalized understanding   Donning Prosthesis Supervision   Doffing Prosthesis Supervision                PT Education - 01/10/16 1503    Education provided Yes   Education Details Educated on cramps in L gastroc to increase mobilty with  cane in the house   Person(s) Educated Patient   Methods Explanation;Demonstration   Comprehension Verbalized understanding          PT Short Term Goals - 01/04/16 1026    PT SHORT TERM GOAL #1   Title Patient verbalizes proper cleaning & demonstrates proper donning of prosthesis. (Target Date: 4th visit after eval)   Baseline MET 01/03/2016   Status Achieved   PT SHORT TERM GOAL #2   Title Patient tolerates prosthesis wear >8hrs total per day without skin issues or tenderness.  (Target Date: 4th visit after eval)   Baseline MET 01/03/2016   Status Achieved   PT SHORT TERM GOAL #3   Title Patient ambulates 400' with crutches & prosthesis with cues on deviations but no balance issues noted.  (Target Date: 4th visit after eval)   Baseline MET 01/03/2016   Status  Achieved   PT SHORT TERM GOAL #4   Title Patient negotiates ramps & curbs with crutches & prosthesis with supervision.  (Target Date: 4th visit after eval)   Baseline MET 01/03/2016   Status Achieved           PT Long Term Goals - 12/28/15 2226    PT LONG TERM GOAL #1   Title patient demonstrates & verbalizes proper prosthetic care to enable safe use of prosthesis. (Target Date: 8th visit after evaluation)   Baseline Patient is dependent in prosthetic care including cleaning, residual limb care, donning properly, adjusting ply socks and adjusting wear schedule.    Status On-going   PT LONG TERM GOAL #2   Title Patient tolerates wear of prosthesis >90% of awake hours without skin issues or limb tenderness to enable function throughout his day. (Target Date: 8th visit after evaluation)   Baseline Patient has worn prosthesis ~50% of 18 days since delivery. He attempted wear 8 hours one day but had redness & tenderness due to increase too rapid.    Status On-going   PT LONG TERM GOAL #3   Title Berg Balance >45/56 to indicate lower risk of falls. (Target Date: 8th visit after evaluation)   Baseline Berg Balance 29/56    Status On-going   PT LONG TERM GOAL #4   Title Patient ambulates >500' outdoor surfaces including grass, ramps, curbs and stairs with LRAD & prosthesis modified independent to enable community mobility. (Target Date: 8th visit after evaluation)   Baseline Patient ambulates 24' with crutches & prosthesis with supervision and gait deviations indicating fall risk.    Status On-going   PT LONG TERM GOAL #5   Title Patient ambulates around furniture carrying household items with LRAD & prosthesis to enable safe household function. (Target Date: 8th visit after evaluation)   Baseline Patient ambulates 41' with crutches & prosthesis with supervision and gait deviations indicating fall risk.    Status On-going               Plan - 01/10/16 1531    Clinical Impression Statement Pt states he feels more comfortable with use of prosthetic leg, the limiting factor during sessions is pain in non-amputated leg. He required reviewing of residual limb managment and was encouraged to get diabetic shoes for his non amputated limb. Patient continues to show improvements in activily tolerance and ability to negogiate unlevel surfaces.    Rehab Potential Good   Clinical Impairments Affecting Rehab Potential deconditioning from limited mobility over long period, multiple medical issues.   PT Frequency 1x / week   PT Duration 8 weeks   PT Treatment/Interventions ADLs/Self Care Home Management;DME Instruction;Gait training;Stair training;Functional mobility training;Therapeutic activities;Therapeutic exercise;Balance training;Neuromuscular re-education;Patient/family education;Prosthetic Training   PT Next Visit Plan Getting on and off the floor   Consulted and Agree with Plan of Care Patient      Patient will benefit from skilled therapeutic intervention in order to improve the following deficits and impairments:  Abnormal gait, Decreased activity tolerance, Decreased balance, Decreased coordination,  Decreased endurance, Decreased knowledge of use of DME, Decreased mobility, Decreased strength, Decreased scar mobility, Pain, Prosthetic Dependency, Postural dysfunction  Visit Diagnosis: Other abnormalities of gait and mobility  Unsteadiness on feet  Muscle weakness (generalized)  Other symptoms and signs involving the musculoskeletal system     Problem List Patient Active Problem List   Diagnosis Date Noted  . Right ear pain 10/31/2015  . Healthcare maintenance 06/21/2015  . Status post below  knee amputation of right lower extremity ([place]) 06/07/2015  . Infection of right foot 06/01/2015  . Osteomyelitis ([place]) 06/01/2015  . Diabetic foot infection ([place]) 06/01/2015  . Diabetic polyneuropathy associated with type 2 diabetes mellitus ([place]) 12/09/2014  . Numbness and tingling in right hand 12/09/2014  . DJD (degenerative joint disease) of knee 07/21/2014  . Osteoarthritis, hip, bilateral 05/25/2014  . Pulmonary emboli ([place]) 02/08/2014  . DVT (deep venous thrombosis) ([place]) 02/08/2014  . Onychomycosis 10/14/2013  . Pure hypercholesterolemia 09/25/2013  . Diabetes mellitus type 2, controlled, with complications ([place]) 03/47/4259  . Diabetic foot ulcer ([place]) 09/15/2013  . Poorly controlled type 2 diabetes mellitus with complication ([place]) 56/38/7564    Dillard Essex, SPT 01/10/2016, 4:20 PM  [facility] [address] [place], [place], [address] Phone: [phone]   Fax:  [phone]  Name: Gregory May MRN: [id number] Date of Birth: Aug 01, 1961

## 2016-01-17 ENCOUNTER — Other Ambulatory Visit (INDEPENDENT_AMBULATORY_CARE_PROVIDER_SITE_OTHER): Payer: Medicaid Other

## 2016-01-17 ENCOUNTER — Encounter: Payer: Medicaid Other | Admitting: Physical Therapy

## 2016-01-17 DIAGNOSIS — Z794 Long term (current) use of insulin: Secondary | ICD-10-CM | POA: Diagnosis not present

## 2016-01-17 DIAGNOSIS — E1165 Type 2 diabetes mellitus with hyperglycemia: Secondary | ICD-10-CM

## 2016-01-17 LAB — BASIC METABOLIC PANEL
BUN: 13 mg/dL (ref 6–23)
CO2: 29 mEq/L (ref 19–32)
Calcium: 9.3 mg/dL (ref 8.4–10.5)
Chloride: 104 mEq/L (ref 96–112)
Creatinine, Ser: 0.91 mg/dL (ref 0.40–1.50)
GFR: 92.53 mL/min (ref >60.00)
Glucose, Bld: 139 mg/dL — ABNORMAL HIGH (ref 70–99)
Potassium: 4 mEq/L (ref 3.5–5.1)
Sodium: 139 mEq/L (ref 135–145)

## 2016-01-18 ENCOUNTER — Ambulatory Visit: Payer: Medicaid Other | Attending: Orthopedic Surgery | Admitting: Physical Therapy

## 2016-01-18 DIAGNOSIS — R2689 Other abnormalities of gait and mobility: Secondary | ICD-10-CM | POA: Diagnosis present

## 2016-01-18 DIAGNOSIS — R2681 Unsteadiness on feet: Secondary | ICD-10-CM | POA: Insufficient documentation

## 2016-01-18 DIAGNOSIS — M6281 Muscle weakness (generalized): Secondary | ICD-10-CM | POA: Insufficient documentation

## 2016-01-18 DIAGNOSIS — R29898 Other symptoms and signs involving the musculoskeletal system: Secondary | ICD-10-CM | POA: Insufficient documentation

## 2016-01-18 LAB — FRUCTOSAMINE: Fructosamine: 286 umol/L — ABNORMAL HIGH (ref 0–285)

## 2016-01-18 NOTE — Therapy (Signed)
[facility] [address] [place], [place], [address] Phone: [phone]   Fax:  [phone]  Physical Therapy Treatment  Patient Details  Name: Gregory May MRN: [id number] Date of Birth: 1960-10-31 Referring Provider: Meridee Score, MD  Encounter Date: 01/18/2016      PT End of Session - 01/18/16 0815    Visit Number 7   Number of Visits 9   Authorization Type Medicaid   Authorization Time Period 12/08/2105 - 03/29/2016   Authorization - Visit Number 6   Authorization - Number of Visits 8   PT Start Time 0808   PT Stop Time 1749   PT Time Calculation (min) 39 min   Equipment Utilized During Treatment Gait belt   Activity Tolerance Patient tolerated treatment well;No increased pain   Behavior During Therapy [facility] for tasks assessed/performed      Past Medical History  Diagnosis Date  . Sebaceous cyst     on back of neck  . Diabetes mellitus     type II  . Pulmonary embolism ([place])   . Deep vein thrombosis (DVT) ([place])   . Arthritis     bilateral hips  . 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 DJD (degenerative joint disease)   . Pulmonary emboli ([place]) 02/08/2014    Date of diagnosis February 08 2014, on chest CTA Hospitalized for 3 days Had some symptoms of shortness of breath, and chest pain With intercurrent DVT of the left LE. Duration of anticoagulation: 8 months. End date 10/11/2014.  Anticoagulant: Lovenox 120 units daily Switched to Eliquis on 05/25/2014 per patient preference     Past Surgical History  Procedure Laterality Date  . Rotator cuff repair  2005 (approx)    right   . Closed reduction with humer pin insertion  1974    left hip  . Joint replacement  2006 (approx)    right hip replaced  . 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 Total hip arthroplasty Left 07/21/2014    Procedure: TOTAL HIP ARTHROPLASTY ANTERIOR APPROACH;  Surgeon: Ninetta Lights, MD;  Location: [facility];   Service: Orthopedics;  Laterality: Left;  . Hardware removal Left 07/21/2014    Procedure: HARDWARE REMOVAL;  Surgeon: Ninetta Lights, MD;  Location: [facility];  Service: Orthopedics;  Laterality: Left;  . Amputation Right 06/03/2015    Procedure: Right Below Knee Amputation;  Surgeon: [name], MD;  Location: [facility];  Service: Orthopedics;  Laterality: Right;    There were no vitals filed for this visit.      Subjective Assessment - 01/18/16 0808    Subjective Flat tire yesterday, pt had trouble moving things out of the trunk, loosing the bolts and getting jack in place. He sweated alot while changing tire says he felt something crawling in liner.    Pertinent History Left THR 07/21/2014, Right THR 2004, DM2, neuropathy, multiple joint surgeries, PE/DVT, drug abuse, arthritis, right RTC   Limitations Lifting;Standing;Walking   Patient Stated Goals to use prosthesis to get back in community.    Currently in Pain? No/denies             Prosthetics Assessment - 01/18/16 0813    Prosthetics   Prosthetic Care Comments  reviewed importantance of adding socks throughout the day, wearing anti percipant, drying with sweating   Current prosthetic wear tolerance (days/week)  daily   Current prosthetic wear tolerance (#hours/day)  wearing all awake hours, drying once every 6 hours was educated to dry at least two times a day and as needed.   Residual limb condition  heat rash on residual limb, redness on distal end of residual limb and patella                     [facility] Adult PT Treatment/Exercise - 01/18/16 0814    Transfers   Transfers Sit to Stand;Stand to Sit;Floor to Transfer   Sit to Stand 6: Modified independent (Device/Increase time);With armrests;From chair/3-in-1   Stand to Sit 6: Modified independent (Device/Increase time);To chair/3-in-1;With upper extremity assist   Floor to Transfer 5: Supervision   Floor to Transfer Details (indicate cue type and reason) Pt  prefers to bring prosthetic knee back and down first when lowering self to ground, cues for shifting weight back while coming down to the floor.    Ambulation/Gait   Ambulation/Gait Yes   Ambulation/Gait Assistance 6: Modified independent (Device/Increase time)   Ambulation Distance (Feet) 180 Feet   Assistive device Prosthesis   Gait Pattern Step-through pattern;Decreased step length - left;Decreased stance time - right;Decreased stride length;Decreased hip/knee flexion - right;Decreased weight shift to right;Abducted- right;Poor foot clearance - right   Stairs Yes   Stairs Assistance 5: Supervision   Stairs Assistance Details (indicate cue type and reason) PT verbal cues and demostration for decending stairs with rail on R side, instructed to perform step to pattern.    Stair Management Technique One rail Right;With cane;Forwards;Step to pattern  modified with prosthesis "good" to protect left knee   Number of Stairs 4   Height of Stairs 6   Ramp --   Curb --   Self-Care   Self-Care Lifting   Lifting Simulated trunk of car, insturcted and verbal cues for proper mechanics with prosthesis. Cues for keeping keeping prosthesis forward and turning back leg.   Prosthetics   Prosthetic Care Comments  reviewed importantance of adding socks throughout the day, wearing anti percipant, drying with sweating   Current prosthetic wear tolerance (days/week)  daily   Current prosthetic wear tolerance (#hours/day)  wearing all awake hours, drying once every 6 hours was educated to dry at least two times a day and as needed.   Residual limb condition  heat rash on residual limb, redness on distal end of residual limb and patella    Education Provided Residual limb care;Skin check;Ply sock cleaning;Correct ply sock adjustment  see prosthetic care comments   Person(s) Educated Patient   Education Method Explanation   Education Method Verbalized understanding                PT Education -  01/18/16 [phone]    Education provided Yes   Education Details Kneeling on concrete to keep liner intact, floor transfers    Person(s) Educated Patient   Methods Explanation;Demonstration;Verbal cues   Comprehension Verbalized understanding;Returned demonstration;Verbal cues required          PT Short Term Goals - 01/04/16 1026    PT SHORT TERM GOAL #1   Title Patient verbalizes proper cleaning & demonstrates proper donning of prosthesis. (Target Date: 4th visit after eval)   Baseline MET 01/03/2016   Status Achieved   PT SHORT TERM GOAL #2   Title Patient tolerates prosthesis wear >8hrs total per day without skin issues or tenderness.  (Target Date: 4th visit after eval)   Baseline MET 01/03/2016   Status Achieved   PT SHORT TERM GOAL #3  Title Patient ambulates 400' with crutches & prosthesis with cues on deviations but no balance issues noted.  (Target Date: 4th visit after eval)   Baseline MET 01/03/2016   Status Achieved   PT SHORT TERM GOAL #4   Title Patient negotiates ramps & curbs with crutches & prosthesis with supervision.  (Target Date: 4th visit after eval)   Baseline MET 01/03/2016   Status Achieved           PT Long Term Goals - 12/28/15 2226    PT LONG TERM GOAL #1   Title patient demonstrates & verbalizes proper prosthetic care to enable safe use of prosthesis. (Target Date: 8th visit after evaluation)   Baseline Patient is dependent in prosthetic care including cleaning, residual limb care, donning properly, adjusting ply socks and adjusting wear schedule.    Status On-going   PT LONG TERM GOAL #2   Title Patient tolerates wear of prosthesis >90% of awake hours without skin issues or limb tenderness to enable function throughout his day. (Target Date: 8th visit after evaluation)   Baseline Patient has worn prosthesis ~50% of 18 days since delivery. He attempted wear 8 hours one day but had redness & tenderness due to increase too rapid.    Status On-going   PT  LONG TERM GOAL #3   Title Berg Balance >45/56 to indicate lower risk of falls. (Target Date: 8th visit after evaluation)   Baseline Berg Balance 29/56   Status On-going   PT LONG TERM GOAL #4   Title Patient ambulates >500' outdoor surfaces including grass, ramps, curbs and stairs with LRAD & prosthesis modified independent to enable community mobility. (Target Date: 8th visit after evaluation)   Baseline Patient ambulates 47' with crutches & prosthesis with supervision and gait deviations indicating fall risk.    Status On-going   PT LONG TERM GOAL #5   Title Patient ambulates around furniture carrying household items with LRAD & prosthesis to enable safe household function. (Target Date: 8th visit after evaluation)   Baseline Patient ambulates 33' with crutches & prosthesis with supervision and gait deviations indicating fall risk.    Status On-going               Plan - 01/18/16 1219    Clinical Impression Statement Todays session focused on lifting and floor transfer which the pt noted he has diffculty with when needing to change his tire. Trailed stairs going down with the rail on R side to mimic issues he is having in community. He still requires cuing for prosthetic and residual limb care. Pt continues to preogress towards long term goals.    Rehab Potential Good   Clinical Impairments Affecting Rehab Potential deconditioning from limited mobility over long period, multiple medical issues.   PT Frequency 1x / week   PT Duration 8 weeks   PT Treatment/Interventions ADLs/Self Care Home Management;DME Instruction;Gait training;Stair training;Functional mobility training;Therapeutic activities;Therapeutic exercise;Balance training;Neuromuscular re-education;Patient/family education;Prosthetic Training   PT Next Visit Plan Getting on and off the floor   Consulted and Agree with Plan of Care Patient      Patient will benefit from skilled therapeutic intervention in order to improve  the following deficits and impairments:  Abnormal gait, Decreased activity tolerance, Decreased balance, Decreased coordination, Decreased endurance, Decreased knowledge of use of DME, Decreased mobility, Decreased strength, Decreased scar mobility, Pain, Prosthetic Dependency, Postural dysfunction  Visit Diagnosis: Other abnormalities of gait and mobility  Unsteadiness on feet  Muscle weakness (generalized)  Other symptoms and  signs involving the musculoskeletal system     Problem List Patient Active Problem List   Diagnosis Date Noted  . Right ear pain 10/31/2015  . Healthcare maintenance 06/21/2015  . Status post below knee amputation of right lower extremity ([place]) 06/07/2015  . Infection of right foot 06/01/2015  . Osteomyelitis ([place]) 06/01/2015  . Diabetic foot infection ([place]) 06/01/2015  . Diabetic polyneuropathy associated with type 2 diabetes mellitus ([place]) 12/09/2014  . Numbness and tingling in right hand 12/09/2014  . DJD (degenerative joint disease) of knee 07/21/2014  . Osteoarthritis, hip, bilateral 05/25/2014  . Pulmonary emboli ([place]) 02/08/2014  . DVT (deep venous thrombosis) ([place]) 02/08/2014  . Onychomycosis 10/14/2013  . Pure hypercholesterolemia 09/25/2013  . Diabetes mellitus type 2, controlled, with complications ([place]) 74/94/4967  . Diabetic foot ulcer ([place]) 09/15/2013  . Poorly controlled type 2 diabetes mellitus with complication ([place]) 59/16/3846    Dillard Essex, SPT 01/18/2016, 12:23 PM  [facility] [address] [place], [place], [address] Phone: [phone]   Fax:  [phone]  Name: KINGSTEN ENFIELD MRN: [id number] Date of Birth: 1961-03-11    Jamey Reas, PT, DPT PT Specializing in [place] 01/18/2016 12:27 PM Phone:  [phone]  Fax:  [phone] [facility] [address] [place], [place] [address]

## 2016-01-19 ENCOUNTER — Other Ambulatory Visit: Payer: Medicaid Other

## 2016-01-22 ENCOUNTER — Other Ambulatory Visit: Payer: Self-pay | Admitting: Endocrinology

## 2016-01-23 ENCOUNTER — Other Ambulatory Visit: Payer: Self-pay

## 2016-01-24 ENCOUNTER — Encounter: Payer: Self-pay | Admitting: Physical Therapy

## 2016-01-24 ENCOUNTER — Ambulatory Visit: Payer: Medicaid Other | Admitting: Physical Therapy

## 2016-01-24 ENCOUNTER — Other Ambulatory Visit: Payer: Medicaid Other

## 2016-01-24 DIAGNOSIS — R2689 Other abnormalities of gait and mobility: Secondary | ICD-10-CM | POA: Diagnosis not present

## 2016-01-24 DIAGNOSIS — M6281 Muscle weakness (generalized): Secondary | ICD-10-CM

## 2016-01-24 DIAGNOSIS — R29898 Other symptoms and signs involving the musculoskeletal system: Secondary | ICD-10-CM

## 2016-01-24 DIAGNOSIS — R2681 Unsteadiness on feet: Secondary | ICD-10-CM

## 2016-01-25 ENCOUNTER — Encounter: Payer: Self-pay | Admitting: Endocrinology

## 2016-01-25 ENCOUNTER — Ambulatory Visit (INDEPENDENT_AMBULATORY_CARE_PROVIDER_SITE_OTHER): Payer: Medicaid Other | Admitting: Endocrinology

## 2016-01-25 VITALS — BP 132/80 | HR 78 | Ht 73.0 in | Wt 205.0 lb

## 2016-01-25 DIAGNOSIS — Z794 Long term (current) use of insulin: Secondary | ICD-10-CM

## 2016-01-25 DIAGNOSIS — E1165 Type 2 diabetes mellitus with hyperglycemia: Secondary | ICD-10-CM | POA: Diagnosis not present

## 2016-01-25 NOTE — Progress Notes (Signed)
Gregory May 55 y.o.           Reason for Appointment: Diabetes follow-up   History of Present Illness   Diagnosis: Type 2 DIABETES MELITUS, date of diagnosis:  1999     Previous history: He has previously been treated with metformin and Victoza and subsequently mealtime insulin added to control postprandial hyperglycemia Overall he has been  relatively noncompliant with his diet, medications, monitoring and followup Previously would not take Victoza regularly because of cost. Has not been taking any medications for the last year and a half because of commitments to family and cost A1c had increased to 12.3% and hyperglycemia discovered when he was hospitalized for foot ulcer. Also lost 20 pounds because of hyperglycemia   Recent history:   Insulin regimen: Lantus: 25 units at supper. Humalog 20 units at breakfast and suppertime      His blood sugars overall have been much worse as of 4/17 with A1c going up to 9% His Lantus was increased and he was told to try and improve his compliance with diet on his last visit    Management, blood sugar patterns and problems identified.   He has started taking 25 units of Lantus and his fasting readings are overall better but not consistent, lowest reading 121  He still has difficulty complying with his Humalog at times especially when going out  Last night when his glucose was 226 after evening meal without any insulin he took a total of 25 units of Humalog causing hypoglycemia this morning at 7 AM  He is dying to have healthier meals but still getting some fast food  POSTPRANDIAL readings have been checked very infrequently and has fair control with only couple of readings after supper  Blood sugars in the afternoon are mostly good  Not able to exercise much because of his prosthesis   He has not been taking any oral hypoglycemic drugs      Proper timing of injections in relation to meals:  usually   Oral hypoglycemic drugs:  None        Side effects from medications: None       Monitors blood glucose: now.  Glucometer: One Touch.          Blood Glucose readings:  By   Mean values apply above for all meters except median for One Touch  PRE-MEAL Fasting Lunch Dinner Bedtime Overall  Glucose range: 65-200   141-190  148-226    Mean/median:     148    Meals:  usually 2 meals per day usually.  breakfast at 12 noon, evening meal 7 pm   Physical activity: exercise: None               Weight control:   Wt Readings from Last 3 Encounters:  01/25/16 205 lb (92.987 kg)  12/13/15 205 lb 9.6 oz (93.26 kg)  10/31/15 200 lb 14.4 oz (91.128 kg)           Diabetes labs:  Lab Results  Component Value Date   HGBA1C 9.0* 12/01/2015   HGBA1C 8.1 10/31/2015   HGBA1C 7.2 07/13/2015   Lab Results  Component Value Date   MICROALBUR 0.8 04/23/2014   LDLCALC 92 12/01/2015   CREATININE 0.91 01/17/2016   No visits with results within 1 Week(s) from this visit. Latest known visit with results is:  Lab on 01/17/2016  Component Date Value Ref Range Status  . Sodium 01/17/2016 139  135 - 145 mEq/L Final  .  Potassium 01/17/2016 4.0  3.5 - 5.1 mEq/L Final  . Chloride 01/17/2016 104  96 - 112 mEq/L Final  . CO2 01/17/2016 29  19 - 32 mEq/L Final  . Glucose, Bld 01/17/2016 139* 70 - 99 mg/dL Final  . BUN 16/05/9603 13  6 - 23 mg/dL Final  . Creatinine, Ser 01/17/2016 0.91  0.40 - 1.50 mg/dL Final  . Calcium 54/04/8118 9.3  8.4 - 10.5 mg/dL Final  . GFR 14/78/2956 92.53  >60.00 mL/min Final  . Fructosamine 01/17/2016 286* 0 - 285 umol/L Final   Comment: Published reference interval for apparently healthy subjects between age 14 and 10 is 24 - 285 umol/L and in a poorly controlled diabetic population is 228 - 563 umol/L with a mean of 396 umol/L.        Medication List       This list is accurate as of: 01/25/16  2:57 PM.  Always use your most recent med list.               acetaminophen 325 MG tablet    Commonly known as:  TYLENOL  Take 2 tablets (650 mg total) by mouth every 6 (six) hours as needed for mild pain, fever or headache (or Fever >/= 101).     cholecalciferol 1000 units tablet  Commonly known as:  VITAMIN D  Take 1 tablet (1,000 Units total) by mouth daily.     gabapentin 300 MG capsule  Commonly known as:  NEURONTIN  Take 1 capsule (300 mg total) by mouth 3 (three) times daily.     HUMALOG 100 UNIT/ML injection  Generic drug:  insulin lispro  INJECT 17 UNITS SUBCUTANEOUSLY THREE TIMES DAILY BEFORE MEALS     insulin glargine 100 UNIT/ML injection  Commonly known as:  LANTUS  Inject 17-20 Units into the skin at bedtime.     oxyCODONE-acetaminophen 5-325 MG tablet  Commonly known as:  PERCOCET/ROXICET  Take 1 tablet by mouth every 4 (four) hours as needed for severe pain. Reported on 01/25/2016     pravastatin 40 MG tablet  Commonly known as:  PRAVACHOL  Take 1 tablet (40 mg total) by mouth daily.        Allergies: No Known Allergies  Past Medical History  Diagnosis Date  . Sebaceous cyst     on back of neck  . Diabetes mellitus     type II  . Pulmonary embolism (HCC)   . Deep vein thrombosis (DVT) (H[facility])   . Arthritis     bilateral hips  . 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 DJD (degenerative joint disease)   . Pulmonary emboli ([facility]) 02/08/2014    Date of diagnosis February 08 2014, on chest CTA Hospitalized for 3 days Had some symptoms of shortness of breath, and chest pain With intercurrent DVT of the left LE. Duration of anticoagulation: 8 months. End date 10/11/2014.  Anticoagulant: Lovenox 120 units daily Switched to Eliquis on 05/25/2014 per patient preference     Past Surgical History  Procedure Laterality Date  . Rotator cuff repair  2005 (approx)    right   . Closed reduction with humer pin insertion  1974    left hip  . Joint replacement  2006 (approx)    right hip replaced  . 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  Total hip arthroplasty Left 07/21/2014    Procedure: TOTAL HIP ARTHROPLASTY ANTERIOR APPROACH;  Surgeon:  Loreta Ave, MD;  Location: [facility] OR;  Service: Orthopedics;  Laterality: Left;  . Hardware removal Left 07/21/2014    Procedure: HARDWARE REMOVAL;  Surgeon: Loreta Ave, MD;  Location: [facility] OR;  Service: Orthopedics;  Laterality: Left;  . Amputation Right 06/03/2015    Procedure: Right Below Knee Amputation;  Surgeon: Nadara Mustard, MD;  Location: [facility];  Service: Orthopedics;  Laterality: Right;    Family History  Problem Relation Age of Onset  . Cancer Mother     breast, colon, liver  . Diabetes Father     Social History:  reports that he has never smoked. He has never used smokeless tobacco. He reports that he drinks alcohol. He reports that he does not use illicit drugs.  Review of Systems:   Lipids: He is on Pravachol with good control of LDL with LDL 92  Lab Results  Component Value Date   CHOL 146 12/01/2015   HDL 42.50 12/01/2015   LDLCALC 92 12/01/2015   LDLDIRECT 91.0 07/11/2015   TRIG 55.0 12/01/2015   CHOLHDL 3 12/01/2015     He is still having problems with carpal tunnel on the right side, Still having numbness  He does have numbness in his left toes, is going to get diabetic shoes    Examination:   BP 132/80 mmHg  Pulse 78  Ht 6\' 1"  (1.854 m)  Wt 205 lb (92.987 kg)  BMI 27.05 kg/m2  SpO2 96%  Body mass index is 27.05 kg/(m^2).     Diabetic Foot Exam - Simple   Simple Foot Form  Diabetic Foot exam was performed with the following findings:  Yes 01/25/2016  1:45 PM  Visual Inspection  See comments:  Yes  Sensation Testing  See comments:  Yes  Pulse Check  Posterior Tibialis and Dorsalis pulse intact bilaterally:  Yes  Comments  Significant areas of scaling.  Has callous formation on the medial part of the right great toe and mild callus formation on the plantar surface No skin ulcers Monofilament sensation is present in the distal foot but  somewhat patchy and generally decreased       ASSESSMENT/ PLAN:     Diabetes type 2:  See history of present illness for detailed discussion of current diabetes management, blood sugar patterns and problems identified With his trying to improve his diet and also taking insulin more consistently his blood sugars are improving He still needs significant amount of mealtime coverage using 20 units of Humalog Fasting readings are inconsistent even with increasing his Lantus to 25  Discussed with the patient that he needs better control of his diabetes long-term because of his complications including neuropathy and amputation  Have shown him the V-go pump and discussed how this works and it would improve his compliance and control Will check his insurance coverage for this and scheduled with nurse educator Most likely he can take the 20 units BASAL BOLUSES with the V-go pump can be started at 12 units for each meal unless eating no carbohydrate Consider metformin if fasting readings tend to be higher if he has weight gain  He will go back to his orthopedist for  Right carpal tunnel syndrome.     Patient Instructions  Check blood sugars on waking up  3-4 times a week Also check blood sugars about 2 hours after a meal and do this after different meals by rotation  Recommended blood sugar levels on waking up is 90-130 and about 2 hours after  meal is 130-160  Please bring your blood sugar monitor to each visit, thank you  Take Humalog with meals    Counseling time on subjects discussed above is over 50% of today's 25 minute visit    Kytzia Gienger 01/25/2016, 2:57 PM   Note: This office note was prepared with Dragon voice recognition system technology. Any transcriptional errors that result from this process are unintentional.

## 2016-01-25 NOTE — Patient Instructions (Signed)
Check blood sugars on waking up  3-4 times a week Also check blood sugars about 2 hours after a meal and do this after different meals by rotation  Recommended blood sugar levels on waking up is 90-130 and about 2 hours after meal is 130-160  Please bring your blood sugar monitor to each visit, thank you  Take Humalog with meals

## 2016-01-26 ENCOUNTER — Encounter: Payer: Self-pay | Admitting: Physical Therapy

## 2016-01-26 NOTE — Therapy (Signed)
[facility] [address] [place], [place], [address] Phone: [phone]   Fax:  [phone]  Physical Therapy Treatment  Patient Details  Name: Gregory May MRN: [id number] Date of Birth: 1960-10-01 Referring Provider: Meridee Score, MD  Encounter Date: 01/24/2016     01/24/16 1530  PT Visits / Re-Eval  Visit Number 8  Number of Visits 9  Authorization  Authorization Type Medicaid  Authorization Time Period 12/08/2105 - 03/29/2016  Authorization - Visit Number 7  Authorization - Number of Visits 8  PT Time Calculation  PT Start Time 6734  PT Stop Time 1617  PT Time Calculation (min) 44 min  PT - End of Session  Equipment Utilized During Treatment Gait belt  Activity Tolerance Patient tolerated treatment well;No increased pain  Behavior During Therapy [facility] for tasks assessed/performed    Past Medical History  Diagnosis Date  . Sebaceous cyst     on back of neck  . Diabetes mellitus     type II  . Pulmonary embolism ([place])   . Deep vein thrombosis (DVT) ([place])   . Arthritis     bilateral hips  . 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 DJD (degenerative joint disease)   . Pulmonary emboli ([place]) 02/08/2014    Date of diagnosis February 08 2014, on chest CTA Hospitalized for 3 days Had some symptoms of shortness of breath, and chest pain With intercurrent DVT of the left LE. Duration of anticoagulation: 8 months. End date 10/11/2014.  Anticoagulant: Lovenox 120 units daily Switched to Eliquis on 05/25/2014 per patient preference     Past Surgical History  Procedure Laterality Date  . Rotator cuff repair  2005 (approx)    right   . Closed reduction with humer pin insertion  1974    left hip  . Joint replacement  2006 (approx)    right hip replaced  . 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 Total hip arthroplasty Left 07/21/2014    Procedure: TOTAL HIP ARTHROPLASTY ANTERIOR APPROACH;   Surgeon: Ninetta Lights, MD;  Location: [facility];  Service: Orthopedics;  Laterality: Left;  . Hardware removal Left 07/21/2014    Procedure: HARDWARE REMOVAL;  Surgeon: Ninetta Lights, MD;  Location: [facility];  Service: Orthopedics;  Laterality: Left;  . Amputation Right 06/03/2015    Procedure: Right Below Knee Amputation;  Surgeon: [name], MD;  Location: [facility];  Service: Orthopedics;  Laterality: Right;    There were no vitals filed for this visit.     01/24/16 1533  Symptoms/Limitations  Subjective He went to mountains to visit his uncle with lots of stairs and a hill. His hip & knee got sore but no issues with prosthesis.  Pertinent History Left THR 07/21/2014, Right THR 2004, DM2, neuropathy, multiple joint surgeries, PE/DVT, drug abuse, arthritis, right RTC  Limitations Lifting;Standing;Walking  Patient Stated Goals to use prosthesis to get back in community.   Pain Assessment  Currently in Pain? No/denies   Prosthetic Training with Transtibial Prostheses: Patient's residual limb has no open areas now with light rash (was heavy 2 weeks ago). He donned prosthesis correctly with minimal cues. PT demo instructed in use of cut-off sock use to increase fit distally where > soft tissue and < material at bony knee area. Pt verbalized understanding and reports it felt more comfortable. Patient ambulated around and over obstacles with cues on technique for clearance &  prosthesis sequence for safety.  Patient ambulated 500' outside on grass, paved sidewalk, ramp & curb with prosthesis only with tactile / verbal cues.  Patient negotiated stairs reciprocally with single rail with cues only.  Patient able to text on phone while ambulating with tactile cues.  Patient required cues for body mechanics for lifting & carrying 15# box. Sit to/from stand from simulated couch with single armrests on each side with verbal cues & demo technique.                         01/24/16 1630   Plan  Clinical Impression Statement Patient is on target to meet LTGs by next week at end of plan of care. His skin seems improved with better sweat management and prosthesis use. Patient improved lifting with instruction & repetition.   Pt will benefit from skilled therapeutic intervention in order to improve on the following deficits Abnormal gait;Decreased activity tolerance;Decreased balance;Decreased coordination;Decreased endurance;Decreased knowledge of use of DME;Decreased mobility;Decreased strength;Decreased scar mobility;Pain;Prosthetic Dependency;Postural dysfunction  Rehab Potential Good  Clinical Impairments Affecting Rehab Potential deconditioning from limited mobility over long period, multiple medical issues.  PT Frequency 1x / week  PT Duration 8 weeks  PT Treatment/Interventions ADLs/Self Care Home Management;DME Instruction;Gait training;Stair training;Functional mobility training;Therapeutic activities;Therapeutic exercise;Balance training;Neuromuscular re-education;Patient/family education;Prosthetic Training  PT Next Visit Plan assess LTGs and discharge  Consulted and Agree with Plan of Care Patient            PT Short Term Goals - 01/04/16 1026    PT SHORT TERM GOAL #1   Title Patient verbalizes proper cleaning & demonstrates proper donning of prosthesis. (Target Date: 4th visit after eval)   Baseline MET 01/03/2016   Status Achieved   PT SHORT TERM GOAL #2   Title Patient tolerates prosthesis wear >8hrs total per day without skin issues or tenderness.  (Target Date: 4th visit after eval)   Baseline MET 01/03/2016   Status Achieved   PT SHORT TERM GOAL #3   Title Patient ambulates 400' with crutches & prosthesis with cues on deviations but no balance issues noted.  (Target Date: 4th visit after eval)   Baseline MET 01/03/2016   Status Achieved   PT SHORT TERM GOAL #4   Title Patient negotiates ramps & curbs with crutches & prosthesis with supervision.  (Target  Date: 4th visit after eval)   Baseline MET 01/03/2016   Status Achieved           PT Long Term Goals - 12/28/15 2226    PT LONG TERM GOAL #1   Title patient demonstrates & verbalizes proper prosthetic care to enable safe use of prosthesis. (Target Date: 8th visit after evaluation)   Baseline Patient is dependent in prosthetic care including cleaning, residual limb care, donning properly, adjusting ply socks and adjusting wear schedule.    Status On-going   PT LONG TERM GOAL #2   Title Patient tolerates wear of prosthesis >90% of awake hours without skin issues or limb tenderness to enable function throughout his day. (Target Date: 8th visit after evaluation)   Baseline Patient has worn prosthesis ~50% of 18 days since delivery. He attempted wear 8 hours one day but had redness & tenderness due to increase too rapid.    Status On-going   PT LONG TERM GOAL #3   Title Berg Balance >45/56 to indicate lower risk of falls. (Target Date: 8th visit after evaluation)   Baseline Berg Balance 29/56  Status On-going   PT LONG TERM GOAL #4   Title Patient ambulates >500' outdoor surfaces including grass, ramps, curbs and stairs with LRAD & prosthesis modified independent to enable community mobility. (Target Date: 8th visit after evaluation)   Baseline Patient ambulates 65' with crutches & prosthesis with supervision and gait deviations indicating fall risk.    Status On-going   PT LONG TERM GOAL #5   Title Patient ambulates around furniture carrying household items with LRAD & prosthesis to enable safe household function. (Target Date: 8th visit after evaluation)   Baseline Patient ambulates 76' with crutches & prosthesis with supervision and gait deviations indicating fall risk.    Status On-going             Patient will benefit from skilled therapeutic intervention in order to improve the following deficits and impairments:  Abnormal gait, Decreased activity tolerance, Decreased  balance, Decreased coordination, Decreased endurance, Decreased knowledge of use of DME, Decreased mobility, Decreased strength, Decreased scar mobility, Pain, Prosthetic Dependency, Postural dysfunction  Visit Diagnosis: Other abnormalities of gait and mobility  Unsteadiness on feet  Muscle weakness (generalized)  Other symptoms and signs involving the musculoskeletal system     Problem List Patient Active Problem List   Diagnosis Date Noted  . Right ear pain 10/31/2015  . Healthcare maintenance 06/21/2015  . Status post below knee amputation of right lower extremity ([place]) 06/07/2015  . Infection of right foot 06/01/2015  . Osteomyelitis ([place]) 06/01/2015  . Diabetic foot infection ([place]) 06/01/2015  . Diabetic polyneuropathy associated with type 2 diabetes mellitus ([place]) 12/09/2014  . Numbness and tingling in right hand 12/09/2014  . DJD (degenerative joint disease) of knee 07/21/2014  . Osteoarthritis, hip, bilateral 05/25/2014  . Pulmonary emboli ([place]) 02/08/2014  . DVT (deep venous thrombosis) ([place]) 02/08/2014  . Onychomycosis 10/14/2013  . Pure hypercholesterolemia 09/25/2013  . Diabetes mellitus type 2, controlled, with complications ([place]) 94/76/5465  . Diabetic foot ulcer ([place]) 09/15/2013  . Poorly controlled type 2 diabetes mellitus with complication ([place]) 03/54/6568    Rogan Ecklund PT, DPT 01/26/2016, 2:25 PM  [facility] [address] [place], [place], [address] Phone: [phone]   Fax:  [phone]  Name: Gregory May MRN: [id number] Date of Birth: 01/27/1961

## 2016-01-27 ENCOUNTER — Telehealth: Payer: Self-pay | Admitting: Endocrinology

## 2016-01-27 NOTE — Telephone Encounter (Signed)
vgo determination Is covered thru medical portion

## 2016-01-27 NOTE — Telephone Encounter (Signed)
Thanks, he is already scheduled with Bonita QuinLinda, make sure he knows about his appointment

## 2016-01-27 NOTE — Telephone Encounter (Signed)
I contacted the pt and advised of note below. Pt is scheduled to see Bonita QuinLinda on 01/31/2016. Pt advised of appointment and voiced understanding.

## 2016-01-31 ENCOUNTER — Telehealth: Payer: Self-pay | Admitting: Nutrition

## 2016-01-31 ENCOUNTER — Encounter: Payer: Medicaid Other | Admitting: Nutrition

## 2016-01-31 ENCOUNTER — Ambulatory Visit: Payer: Medicaid Other | Admitting: Physical Therapy

## 2016-01-31 ENCOUNTER — Telehealth: Payer: Self-pay | Admitting: Endocrinology

## 2016-01-31 ENCOUNTER — Encounter: Payer: Self-pay | Admitting: Physical Therapy

## 2016-01-31 DIAGNOSIS — R2681 Unsteadiness on feet: Secondary | ICD-10-CM

## 2016-01-31 DIAGNOSIS — R2689 Other abnormalities of gait and mobility: Secondary | ICD-10-CM | POA: Diagnosis not present

## 2016-01-31 DIAGNOSIS — M6281 Muscle weakness (generalized): Secondary | ICD-10-CM

## 2016-01-31 DIAGNOSIS — R29898 Other symptoms and signs involving the musculoskeletal system: Secondary | ICD-10-CM

## 2016-01-31 NOTE — Telephone Encounter (Signed)
Patient ask you to call him °

## 2016-01-31 NOTE — Telephone Encounter (Signed)
Patient calling on the status of the form that was sent for his diabetic shoes, please advise

## 2016-01-31 NOTE — Telephone Encounter (Signed)
Gregory May has not heard from [facility] and forgot the appt. Today.  Gave telephone number to call them.  He will reschedule when he learns the cost and if he can afford it.

## 2016-02-01 NOTE — Therapy (Signed)
[facility] [address] [place], [place], [address] Phone: [phone]   Fax:  [phone]  Physical Therapy Treatment  Patient Details  Name: Gregory May MRN: [id number] Date of Birth: June 15, 1961 Referring Provider: Aldean Baker, MD  Encounter Date: 01/31/2016      PT End of Session - 01/31/16 1630    Visit Number 9   Number of Visits 9   Authorization Type Medicaid   Authorization Time Period 12/08/2105 - 03/29/2016   Authorization - Visit Number 8   Authorization - Number of Visits 8   PT Start Time 1534   PT Stop Time 1620   PT Time Calculation (min) 46 min   Equipment Utilized During Treatment Gait belt   Activity Tolerance Patient tolerated treatment well;No increased pain   Behavior During Therapy [facility] for tasks assessed/performed      Past Medical History  Diagnosis Date  . Sebaceous cyst     on back of neck  . Diabetes mellitus     type II  . Pulmonary embolism (HCC)   . Deep vein thrombosis (DVT) (HCC)   . Arthritis     bilateral hips  . 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 DJD (degenerative joint disease)   . Pulmonary emboli (HCC) 02/08/2014    Date of diagnosis February 08 2014, on chest CTA Hospitalized for 3 days Had some symptoms of shortness of breath, and chest pain With intercurrent DVT of the left LE. Duration of anticoagulation: 8 months. End date 10/11/2014.  Anticoagulant: Lovenox 120 units daily Switched to Eliquis on 05/25/2014 per patient preference     Past Surgical History  Procedure Laterality Date  . Rotator cuff repair  2005 (approx)    right   . Closed reduction with humer pin insertion  1974    left hip  . Joint replacement  2006 (approx)    right hip replaced  . 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 Total hip arthroplasty Left 07/21/2014    Procedure: TOTAL HIP ARTHROPLASTY ANTERIOR APPROACH;  Surgeon: Loreta Ave, MD;  Location: [facility] OR;   Service: Orthopedics;  Laterality: Left;  . Hardware removal Left 07/21/2014    Procedure: HARDWARE REMOVAL;  Surgeon: Loreta Ave, MD;  Location: [facility] OR;  Service: Orthopedics;  Laterality: Left;  . Amputation Right 06/03/2015    Procedure: Right Below Knee Amputation;  Surgeon: Nadara Mustard, MD;  Location: [facility] OR;  Service: Orthopedics;  Laterality: Right;    There were no vitals filed for this visit.      Subjective Assessment - 01/31/16 1535    Subjective He reports wearing all awake hours daily. No falls or issues.    Pertinent History Left THR 07/21/2014, Right THR 2004, DM2, neuropathy, multiple joint surgeries, PE/DVT, drug abuse, arthritis, right RTC   Limitations Lifting;Standing;Walking   Patient Stated Goals to use prosthesis to get back in community.    Currently in Pain? No/denies            [facility] PT Assessment - 01/31/16 1530    Observation/Other Assessments   Focus on Therapeutic Outcomes (FOTO)  51.22 Functional Status  39.07 FS initially   Fear Avoidance Belief Questionnaire (FABQ)  19 (5)  Initial was 76 (21) Lower indicates improvement   Ambulation/Gait   Gait velocity 3.28 ft/sec comfortable; 4.47 ft/sec fast  2.06 ft/sec initially   [employer]  to Stand Able to stand without using hands and stabilize independently   Standing Unsupported Able to stand safely 2 minutes   Sitting with Back Unsupported but Feet Supported on Floor or Stool Able to sit safely and securely 2 minutes   Stand to Sit Sits safely with minimal use of hands   Transfers Able to transfer safely, minor use of hands   Standing Unsupported with Eyes Closed Able to stand 10 seconds safely   Standing Ubsupported with Feet Together Able to place feet together independently and stand 1 minute safely   From Standing, Reach Forward with Outstretched Arm Can reach confidently >25 cm (10")   From Standing Position, Pick up Object from Floor Able to pick up shoe safely and easily    From Standing Position, Turn to Look Behind Over each Shoulder Looks behind from both sides and weight shifts well   Turn 360 Degrees Able to turn 360 degrees safely but slowly   Standing Unsupported, Alternately Place Feet on Step/Stool Able to complete >2 steps/needs minimal assist   Standing Unsupported, One Foot in Front Able to take small step independently and hold 30 seconds   Standing on One Leg Tries to lift leg/unable to hold 3 seconds but remains standing independently   Total Score 46   Berg comment: Initial was 29/56   Functional Gait  Assessment   Gait assessed  Yes   Gait Level Surface Walks 20 ft in less than 7 sec but greater than 5.5 sec, uses assistive device, slower speed, mild gait deviations, or deviates 6-10 in outside of the 12 in walkway width.   Change in Gait Speed Able to change speed, demonstrates mild gait deviations, deviates 6-10 in outside of the 12 in walkway width, or no gait deviations, unable to achieve a major change in velocity, or uses a change in velocity, or uses an assistive device.   Gait with Horizontal Head Turns Performs head turns smoothly with slight change in gait velocity (eg, minor disruption to smooth gait path), deviates 6-10 in outside 12 in walkway width, or uses an assistive device.   Gait with Vertical Head Turns Performs task with slight change in gait velocity (eg, minor disruption to smooth gait path), deviates 6 - 10 in outside 12 in walkway width or uses assistive device   Gait and Pivot Turn Pivot turns safely in greater than 3 sec and stops with no loss of balance, or pivot turns safely within 3 sec and stops with mild imbalance, requires small steps to catch balance.   Step Over Obstacle Is able to step over one shoe box (4.5 in total height) but must slow down and adjust steps to clear box safely. May require verbal cueing.   Gait with Narrow Base of Support Ambulates less than 4 steps heel to toe or cannot perform without assistance.    Gait with Eyes Closed Walks 20 ft, slow speed, abnormal gait pattern, evidence for imbalance, deviates 10-15 in outside 12 in walkway width. Requires more than 9 sec to ambulate 20 ft.   Ambulating Backwards Walks 20 ft, slow speed, abnormal gait pattern, evidence for imbalance, deviates 10-15 in outside 12 in walkway width.   Steps Alternating feet, must use rail.   Total Score 15         Prosthetics Assessment - 01/31/16 1530    Prosthetics   Prosthetic Care Independent with Skin check;Residual limb care;Care of non-amputated limb;Prosthetic cleaning;Ply sock cleaning;Correct ply sock adjustment;Proper wear schedule/adjustment;Proper weight-bearing schedule/adjustment  Donning prosthesis  Modified independent (Device/Increase time)   Doffing prosthesis  Modified independent (Device/Increase time)   Current prosthetic wear tolerance (days/week)  daily   Current prosthetic wear tolerance (#hours/day)  wearing all awake hours, drying once every 6 hours was educated to dry at least two times a day and as needed.   Residual limb condition  no open areas or rashes, cylinderical                            PT Education - 01/31/16 1630    Education provided Yes   Education Details donning long pants that will not pull up over prosthetic knee area. Putting shoe on/off prosthesis. Diabetic foot care. Ongoing follow-up with prosthetist.    Person(s) Educated Patient   Methods Explanation;Demonstration   Comprehension Verbalized understanding          PT Short Term Goals - 01/04/16 1026    PT SHORT TERM GOAL #1   Title Patient verbalizes proper cleaning & demonstrates proper donning of prosthesis. (Target Date: 4th visit after eval)   Baseline MET 01/03/2016   Status Achieved   PT SHORT TERM GOAL #2   Title Patient tolerates prosthesis wear >8hrs total per day without skin issues or tenderness.  (Target Date: 4th visit after eval)   Baseline MET 01/03/2016   Status  Achieved   PT SHORT TERM GOAL #3   Title Patient ambulates 400' with crutches & prosthesis with cues on deviations but no balance issues noted.  (Target Date: 4th visit after eval)   Baseline MET 01/03/2016   Status Achieved   PT SHORT TERM GOAL #4   Title Patient negotiates ramps & curbs with crutches & prosthesis with supervision.  (Target Date: 4th visit after eval)   Baseline MET 01/03/2016   Status Achieved           PT Long Term Goals - 01/31/16 1630    PT LONG TERM GOAL #1   Title patient demonstrates & verbalizes proper prosthetic care to enable safe use of prosthesis. (Target Date: 8th visit after evaluation)   Baseline MET 01/31/2016    Status Achieved   PT LONG TERM GOAL #2   Title Patient tolerates wear of prosthesis >90% of awake hours without skin issues or limb tenderness to enable function throughout his day. (Target Date: 8th visit after evaluation)   Baseline MET 01/31/2016   Status Achieved   PT LONG TERM GOAL #3   Title Berg Balance >45/56 to indicate lower risk of falls. (Target Date: 8th visit after evaluation)   Baseline MET 01/31/2016  Merrilee Jansky 46/56   Status Achieved   PT LONG TERM GOAL #4   Title Patient ambulates >500' outdoor surfaces including grass, ramps, curbs and stairs with LRAD & prosthesis modified independent to enable community mobility. (Target Date: 8th visit after evaluation)   Baseline MET 01/31/2016 with prosthesis only   Status Achieved   PT LONG TERM GOAL #5   Title Patient ambulates around furniture carrying household items with LRAD & prosthesis to enable safe household function. (Target Date: 8th visit after evaluation)   Baseline MET 01/31/2016 with prosthesis only   Status Achieved               Plan - 01/31/16 1630    Clinical Impression Statement Patient met all LTGs with prosthesis only. He uses a cane for longer community distances but demonstrates safe mobility at basic community level with prosthesis  onlyMerrilee Jansky Balance  increase from 29/56 to 46/56 indicates significant lower fall risk.    Rehab Potential Good   Clinical Impairments Affecting Rehab Potential deconditioning from limited mobility over long period, multiple medical issues.   PT Frequency 1x / week   PT Duration 8 weeks   PT Treatment/Interventions ADLs/Self Care Home Management;DME Instruction;Gait training;Stair training;Functional mobility training;Therapeutic activities;Therapeutic exercise;Balance training;Neuromuscular re-education;Patient/family education;Prosthetic Training   PT Next Visit Plan discharge   Consulted and Agree with Plan of Care Patient      Patient will benefit from skilled therapeutic intervention in order to improve the following deficits and impairments:  Abnormal gait, Decreased activity tolerance, Decreased balance, Decreased coordination, Decreased endurance, Decreased knowledge of use of DME, Decreased mobility, Decreased strength, Decreased scar mobility, Pain, Prosthetic Dependency, Postural dysfunction  Visit Diagnosis: Other abnormalities of gait and mobility  Unsteadiness on feet  Muscle weakness (generalized)  Other symptoms and signs involving the musculoskeletal system     Problem List Patient Active Problem List   Diagnosis Date Noted  . Right ear pain 10/31/2015  . Healthcare maintenance 06/21/2015  . Status post below knee amputation of right lower extremity ([place]) 06/07/2015  . Infection of right foot 06/01/2015  . Osteomyelitis ([place]) 06/01/2015  . Diabetic foot infection ([place]) 06/01/2015  . Diabetic polyneuropathy associated with type 2 diabetes mellitus ([place]) 12/09/2014  . Numbness and tingling in right hand 12/09/2014  . DJD (degenerative joint disease) of knee 07/21/2014  . Osteoarthritis, hip, bilateral 05/25/2014  . Pulmonary emboli ([place]) 02/08/2014  . DVT (deep venous thrombosis) ([place]) 02/08/2014  . Onychomycosis 10/14/2013  . Pure hypercholesterolemia 09/25/2013  . Diabetes  mellitus type 2, controlled, with complications ([place]) 67/34/1937  . Diabetic foot ulcer ([place]) 09/15/2013  . Poorly controlled type 2 diabetes mellitus with complication ([place]) 90/24/0973    PHYSICAL THERAPY DISCHARGE SUMMARY  Visits from Start of Care: 9  Current functional level related to goals / functional outcomes: See above   Remaining deficits: See above   Education / Equipment: HEP & prosthetic care  Plan: Patient agrees to discharge.  Patient goals were met. Patient is being discharged due to meeting the stated rehab goals.  ?????        Averee Harb PT, DPT 02/01/2016, 1:52 PM  [facility] [address] [place], [place], [address] Phone: [phone]   Fax:  [phone]  Name: AZAZEL FRANZE MRN: [id number] Date of Birth: 1961/06/24

## 2016-02-07 ENCOUNTER — Other Ambulatory Visit: Payer: Self-pay

## 2016-02-08 ENCOUNTER — Other Ambulatory Visit: Payer: Self-pay

## 2016-02-08 ENCOUNTER — Ambulatory Visit: Payer: Medicaid Other | Admitting: Endocrinology

## 2016-02-08 MED ORDER — V-GO 20 KIT: PACK | Status: DC

## 2016-02-09 ENCOUNTER — Telehealth: Payer: Self-pay | Admitting: Endocrinology

## 2016-02-09 NOTE — Telephone Encounter (Signed)
Patient ask if you got his form filled out for his diabetic shoes. Please advise

## 2016-02-10 NOTE — Telephone Encounter (Signed)
It requires a prescription for specific diabetic shoes which should be done by orthopedic surgeon, it is on my desk

## 2016-02-13 NOTE — Telephone Encounter (Signed)
I contacted the pt and advised of note below. Pt requested that we contact [facility] and verify if they could send the request to his orthopedic doctor. I contacted [facility] and they are closed until Wed for July 4th. Pt notified of this and advised I would try again on Wednesday. He voiced understanding.

## 2016-02-21 ENCOUNTER — Encounter: Payer: Medicaid Other | Attending: Endocrinology | Admitting: Nutrition

## 2016-02-21 DIAGNOSIS — Z713 Dietary counseling and surveillance: Secondary | ICD-10-CM | POA: Diagnosis not present

## 2016-02-21 DIAGNOSIS — IMO0001 Reserved for inherently not codable concepts without codable children: Secondary | ICD-10-CM

## 2016-02-21 DIAGNOSIS — Z794 Long term (current) use of insulin: Secondary | ICD-10-CM | POA: Diagnosis not present

## 2016-02-21 DIAGNOSIS — E1165 Type 2 diabetes mellitus with hyperglycemia: Secondary | ICD-10-CM | POA: Diagnosis present

## 2016-02-21 NOTE — Patient Instructions (Signed)
Test blood sugars before meals and at bedtime, and call results to Dr. Remus BlakeKumar's office on Friday:  [phone] Stop all Lantus insulin Take 6 button presses before all meals. (12 units)

## 2016-02-21 NOTE — Progress Notes (Signed)
Gregory May was instructed on how to fill, appy and use the V-go .  He filled a V-Go with little assistance from me and applied it to his upper left abdomen.  Written instructions were given to stop the Lantus, (he took none last night), and to give 6 button presses before each meal.  He admits to sometimes eating more--giving himself more Humalog, and he was told that the V-Go only holds 36u.  He suggested that he could possible give himself more insulin using his vial of Humalog and a syringe.  I asked him not not do this until he discusses this with Dr Dwyane Dee next week. He was given a sheet to record his blood sugars and told to test ac, and HS.  He agreed to do this, and call Dr. Dwyane Dee on Friday with blood sugar readings.  The telephone number was written on the log sheet with a message to call on Friday. He had no questions. He was given a starter kit with a phone number to call for assistance 24/7, and directions for use.  We reviewed all of this and he had no final questions.   He was reminded again to take no Lantus, and agreed to stop this.

## 2016-02-23 ENCOUNTER — Telehealth: Payer: Self-pay | Admitting: Endocrinology

## 2016-02-23 NOTE — Telephone Encounter (Signed)
Patient is calling about his diabetic shoes , please advise

## 2016-02-24 NOTE — Telephone Encounter (Signed)
Called PT, he is going to contact his Orthopedic MD today and find out what he prefers.

## 2016-02-24 NOTE — Telephone Encounter (Signed)
Caitlin, Could you please contact the pt and advise him to contact his orthopedic md. Per Dr. Lucianne MussKumar the diabetic shoe choices on the form for the  has to be recommend by his orthopedic MD. The form has 5 different choices and Dr. Lucianne MussKumar does not know which one would the pt would need. Thanks!

## 2016-02-28 ENCOUNTER — Telehealth: Payer: Self-pay | Admitting: Endocrinology

## 2016-02-28 NOTE — Telephone Encounter (Signed)
Patient stated that the Orthopedic MD, told you to fax them the form. Or did you already fax it, please advise

## 2016-02-29 NOTE — Telephone Encounter (Signed)
Form has been submitted

## 2016-03-02 ENCOUNTER — Encounter: Payer: Self-pay | Admitting: Endocrinology

## 2016-03-02 ENCOUNTER — Ambulatory Visit (INDEPENDENT_AMBULATORY_CARE_PROVIDER_SITE_OTHER): Payer: Medicaid Other | Admitting: Endocrinology

## 2016-03-02 VITALS — BP 126/70 | HR 81 | Ht 73.0 in | Wt 207.0 lb

## 2016-03-02 DIAGNOSIS — E1165 Type 2 diabetes mellitus with hyperglycemia: Secondary | ICD-10-CM

## 2016-03-02 DIAGNOSIS — Z794 Long term (current) use of insulin: Secondary | ICD-10-CM

## 2016-03-02 LAB — POCT GLYCOSYLATED HEMOGLOBIN (HGB A1C): Hemoglobin A1C: 8.5

## 2016-03-02 MED ORDER — METFORMIN HCL ER 750 MG PO TB24: 750.0000 mg | ORAL_TABLET | Freq: Every day | ORAL | Status: DC

## 2016-03-02 NOTE — Patient Instructions (Signed)
Low fat diet  Metformin 2 at supper, start with 1

## 2016-03-02 NOTE — Progress Notes (Signed)
Gregory May 55 y.o.           Reason for Appointment: Diabetes follow-up   History of Present Illness   Diagnosis: Type 2 DIABETES MELITUS, date of diagnosis:  1999     Previous history: He has previously been treated with metformin and Victoza and subsequently mealtime insulin added to control postprandial hyperglycemia Overall he has been  relatively noncompliant with his diet, medications, monitoring and followup Previously would not take Victoza regularly because of cost. Has not been taking any medications for the last year and a half because of commitments to family and cost A1c had increased to 12.3% and hyperglycemia discovered when he was hospitalized for foot ulcer. Also lost 20 pounds because of hyperglycemia   Recent history:   Insulin regimen: V-go 20 unit basal, mealtime boluses 10-14 units  His blood sugars overall had been much worse as of 4/17 with A1c going up to 9% For better control, compliance and inconvenience he was switched to the V-go pump on 02/21/16 and is here for short-term follow-up    Management, blood sugar patterns and problems identified.   He has had no difficulty with using the pump and doing his boluses; only has difficulty getting the adhesive to come off, when changing  pump  However his fasting blood sugars appear to be mostly high  He is doing better with trying to bolus with all his meals but occasionally may not do so late at night or large snack  He says that he sometimes runs out of bolus insulin from his pump and will take a Humalog injection if needed  Blood sugars in the afternoon are better but he has not checked enough readings after meals  Not able to exercise much because of his using prosthesis   He has not been taking any oral hypoglycemic drugs      Proper timing of injections in relation to meals:  usually   Oral hypoglycemic drugs: None        Side effects from medications: None       Monitors blood glucose:  now.  Glucometer: One Touch.          Blood Glucose readings:  By download  Mean values apply above for all meters except median for One Touch  PRE-MEAL Fasting Lunch Dinner Bedtime Overall  Glucose range: 121-190   1:15-132  165-216    Mean/median:     151     Meals:  usually 2 meals per day usually.  breakfast at 12 noon, evening meal 7 pm, variable snacks late at night   Physical activity: exercise: None               Weight control:   Wt Readings from Last 3 Encounters:  03/02/16 207 lb (93.895 kg)  01/25/16 205 lb (92.987 kg)  12/13/15 205 lb 9.6 oz (93.26 kg)           Diabetes labs:  Lab Results  Component Value Date   HGBA1C 8.5 03/02/2016   HGBA1C 9.0* 12/01/2015   HGBA1C 8.1 10/31/2015   Lab Results  Component Value Date   MICROALBUR 0.8 04/23/2014   [place] 92 12/01/2015   CREATININE 0.91 01/17/2016   Office Visit on 03/02/2016  Component Date Value Ref Range Status  . Hemoglobin A1C 03/02/2016 8.5   Final       Medication List       This list is accurate as of: 03/02/16 11:59 PM.  Always use your  most recent med list.               acetaminophen 325 MG tablet  Commonly known as:  TYLENOL  Take 2 tablets (650 mg total) by mouth every 6 (six) hours as needed for mild pain, fever or headache (or Fever >/= 101).     cholecalciferol 1000 units tablet  Commonly known as:  VITAMIN D  Take 1 tablet (1,000 Units total) by mouth daily.     gabapentin 300 MG capsule  Commonly known as:  NEURONTIN  Take 1 capsule (300 mg total) by mouth 3 (three) times daily.     HUMALOG 100 UNIT/ML injection  Generic drug:  insulin lispro  INJECT 17 UNITS SUBCUTANEOUSLY THREE TIMES DAILY BEFORE MEALS     insulin glargine 100 UNIT/ML injection  Commonly known as:  LANTUS  Inject 17-20 Units into the skin at bedtime.     metFORMIN 750 MG 24 hr tablet  Commonly known as:  GLUCOPHAGE-XR  Take 1 tablet (750 mg total) by mouth daily with breakfast.      oxyCODONE-acetaminophen 5-325 MG tablet  Commonly known as:  PERCOCET/ROXICET  Take 1 tablet by mouth every 4 (four) hours as needed for severe pain. Reported on 03/02/2016     pravastatin 40 MG tablet  Commonly known as:  PRAVACHOL  Take 1 tablet (40 mg total) by mouth daily.     V-GO 20 Kit  Use 1 device daily.        Allergies: No Known Allergies  Past Medical History  Diagnosis Date  . Sebaceous cyst     on back of neck  . Diabetes mellitus     type II  . Pulmonary embolism ([place])   . Deep vein thrombosis (DVT) ([employer])   . Arthritis     bilateral hips  . 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 DJD (degenerative joint disease)   . Pulmonary emboli ([place]) 02/08/2014    Date of diagnosis February 08 2014, on chest CTA Hospitalized for 3 days Had some symptoms of shortness of breath, and chest pain With intercurrent DVT of the left LE. Duration of anticoagulation: 8 months. End date 10/11/2014.  Anticoagulant: Lovenox 120 units daily Switched to Eliquis on 05/25/2014 per patient preference     Past Surgical History  Procedure Laterality Date  . Rotator cuff repair  2005 (approx)    right   . Closed reduction with humer pin insertion  1974    left hip  . Joint replacement  2006 (approx)    right hip replaced  . 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 Total hip arthroplasty Left 07/21/2014    Procedure: TOTAL HIP ARTHROPLASTY ANTERIOR APPROACH;  Surgeon: Ninetta Lights, MD;  Location: [facility];  Service: Orthopedics;  Laterality: Left;  . Hardware removal Left 07/21/2014    Procedure: HARDWARE REMOVAL;  Surgeon: Ninetta Lights, MD;  Location: [facility];  Service: Orthopedics;  Laterality: Left;  . Amputation Right 06/03/2015    Procedure: Right Below Knee Amputation;  Surgeon: [name], MD;  Location: [facility];  Service: Orthopedics;  Laterality: Right;    Family History  Problem Relation Age of Onset  . Cancer Mother     breast, colon, liver  . Diabetes  Father     Social History:  reports that he has never smoked. He has never used smokeless tobacco. He reports that he drinks alcohol. He  reports that he does not use illicit drugs.  Review of Systems:   Lipids: He is on Pravachol with good control of LDL with LDL 92  Lab Results  Component Value Date   CHOL 146 12/01/2015   HDL 42.50 12/01/2015   LDLCALC 92 12/01/2015   LDLDIRECT 91.0 07/11/2015   TRIG 55.0 12/01/2015   CHOLHDL 3 12/01/2015     Last foot exam was in 6/17   Examination:   BP 126/70 mmHg  Pulse 81  Ht _0  (1.854 m)  Wt 207 lb (93.895 kg)  BMI 27.32 kg/m2  SpO2 94%  Body mass index is 27.32 kg/(m^2).   ASSESSMENT/ PLAN:     Diabetes type 2:  See history of present illness for detailed discussion of current diabetes management, blood sugar patterns and problems identified  He is now using the V-go pump with 20 unit basal Although his blood sugars are not clearly better compared to basal bolus insulin his compliance with mealtime insulin is improving especially when he is not at home for meals or large snacks He does like to continue this Also may continue to use Humalog with the pen if she runs out of bolus insulin from his pump Discussed need to check more readings before after his meals especially at night to help adjust boluses Discussed using various solvents to remove the adhesive  from his pump sites  Since fasting blood sugars are mostly higher will restart metformin, renal function has been consistently normal and he has tolerated this before.  May also offset some weight gain.  Discussed benefits of metformin, dosing regimen and possible side effects  Also encouraged him to start watching his diet or high-fat meals and foods like french fries Consider consultation with dietitian  Patient Instructions  Low fat diet  Metformin 2 at supper, start with 1   Counseling time on subjects discussed above is over 50% of today's 25 minute  visit    Nathan Moctezuma 03/03/2016, 4:50 PM   Note: This office note was prepared with Estate agent. Any transcriptional errors that result from this process are unintentional.

## 2016-03-28 NOTE — Telephone Encounter (Signed)
Orthopedic MD office  Stated they haven't received the form, please resend.

## 2016-03-29 NOTE — Telephone Encounter (Signed)
Dr. Lucianne MussKumar has received the neccessary forms for the pt's diabetic shoes and has completed and faxed back with office notes to [facility].

## 2016-04-13 ENCOUNTER — Other Ambulatory Visit (INDEPENDENT_AMBULATORY_CARE_PROVIDER_SITE_OTHER): Payer: Medicaid Other

## 2016-04-13 ENCOUNTER — Ambulatory Visit: Payer: Medicaid Other | Admitting: Endocrinology

## 2016-04-13 DIAGNOSIS — E1165 Type 2 diabetes mellitus with hyperglycemia: Secondary | ICD-10-CM | POA: Diagnosis not present

## 2016-04-13 DIAGNOSIS — Z794 Long term (current) use of insulin: Secondary | ICD-10-CM

## 2016-04-13 LAB — MICROALBUMIN / CREATININE URINE RATIO
Creatinine,U: 67 mg/dL
Microalb Creat Ratio: 1.3 mg/g (ref 0.0–30.0)
Microalb, Ur: 0.9 mg/dL (ref 0.0–1.9)

## 2016-04-13 LAB — BASIC METABOLIC PANEL
BUN: 13 mg/dL (ref 6–23)
CO2: 29 mEq/L (ref 19–32)
Calcium: 9.1 mg/dL (ref 8.4–10.5)
Chloride: 102 mEq/L (ref 96–112)
Creatinine, Ser: 0.91 mg/dL (ref 0.40–1.50)
GFR: 92.45 mL/min (ref >60.00)
Glucose, Bld: 123 mg/dL — ABNORMAL HIGH (ref 70–99)
Potassium: 4.1 mEq/L (ref 3.5–5.1)
Sodium: 139 mEq/L (ref 135–145)

## 2016-04-14 LAB — FRUCTOSAMINE: Fructosamine: 260 umol/L (ref 0–285)

## 2016-04-18 ENCOUNTER — Ambulatory Visit (INDEPENDENT_AMBULATORY_CARE_PROVIDER_SITE_OTHER): Payer: Medicaid Other | Admitting: Endocrinology

## 2016-04-18 ENCOUNTER — Encounter: Payer: Self-pay | Admitting: Endocrinology

## 2016-04-18 VITALS — BP 142/94 | HR 73 | Temp 98.0°F | Resp 16 | Ht 75.0 in | Wt 201.8 lb

## 2016-04-18 DIAGNOSIS — E1165 Type 2 diabetes mellitus with hyperglycemia: Secondary | ICD-10-CM

## 2016-04-18 DIAGNOSIS — Z794 Long term (current) use of insulin: Secondary | ICD-10-CM | POA: Diagnosis not present

## 2016-04-18 MED ORDER — METFORMIN HCL ER 750 MG PO TB24: 1500.0000 mg | ORAL_TABLET | Freq: Every day | ORAL | 3 refills | Status: DC

## 2016-04-18 NOTE — Patient Instructions (Signed)
Check blood sugars on waking up    Also check blood sugars about 2 hours after a meal and do this after different meals by rotation  Recommended blood sugar levels on waking up is 90-130 and about 2 hours after meal is 130-160  Please bring your blood sugar monitor to each visit, thank you  Take 2 daily

## 2016-04-18 NOTE — Progress Notes (Signed)
Gregory May 55 y.o.           Reason for Appointment: Diabetes follow-up   History of Present Illness   Diagnosis: Type 2 DIABETES MELITUS, date of diagnosis:  1999     Previous history: He has previously been treated with metformin and Victoza and subsequently mealtime insulin added to control postprandial hyperglycemia Overall he has been  relatively noncompliant with his diet, medications, monitoring and followup Previously would not take Victoza regularly because of cost. Has not been taking any medications for the last year and a half because of commitments to family and cost A1c had increased to 12.3% and hyperglycemia discovered when he was hospitalized for foot ulcer. Also lost 20 pounds because of hyperglycemia   Recent history:   Insulin regimen: V-go 20 unit basal, mealtime boluses 10-14 units  Fructosamine is 260, last A1c was 8.5 in July  For better control, compliance and inconvenience he was switched to the V-go pump on 02/21/16    Management, blood sugar patterns and problems identified.   He has had no difficulty with using the pump and doing his boluses  Because of his fasting blood sugars being mostly higher he was told to get started on metformin ER 750 mg daily  Did not bring his monitor today  He thinks his fasting readings are generally better but he will forget  the metformin sometimes in the evening and sugar will be higher the next day  He claims his blood sugars are very good after lunch but often not checking after supper  Will sometimes takes extra insulin if eating more carbohydrates or sweets  He thinks he has lost weight although he didn't have his prosthesis today   He has been taking any oral hypoglycemic drugs      Proper timing of injections in relation to meals:  usually   Oral hypoglycemic drugs: None        Side effects from medications: None       Monitors blood glucose: now.  Glucometer: One Touch.          Blood Glucose  readings:  By recall  Mean values apply above for all meters except median for One Touch  PRE-MEAL Fasting Lunch Dinner Bedtime Overall  Glucose range: 121-188    <160 ?    Mean/median:         Meals:  usually 2 meals per day usually.  breakfast at 12 noon, evening meal 7 pm, variable snacks late at night   Physical activity: exercise: None               Weight control:   Wt Readings from Last 3 Encounters:  04/18/16 201 lb 12.8 oz (91.5 kg)  03/02/16 207 lb (93.9 kg)  01/25/16 205 lb (93 kg)           Diabetes labs:  Lab Results  Component Value Date   HGBA1C 8.5 03/02/2016   HGBA1C 9.0 (H) 12/01/2015   HGBA1C 8.1 10/31/2015   Lab Results  Component Value Date   MICROALBUR 0.9 04/13/2016   LDLCALC 92 12/01/2015   CREATININE 0.91 04/13/2016   Lab on 04/13/2016  Component Date Value Ref Range Status  . Fructosamine 04/14/2016 260  0 - 285 umol/L Final   Comment: Published reference interval for apparently healthy subjects between age 104 and 84 is 63 - 285 umol/L and in a poorly controlled diabetic population is 228 - 563 umol/L with a mean of 396 umol/L.   Marland Kitchen  Sodium 04/13/2016 139  135 - 145 mEq/L Final  . Potassium 04/13/2016 4.1  3.5 - 5.1 mEq/L Final  . Chloride 04/13/2016 102  96 - 112 mEq/L Final  . CO2 04/13/2016 29  19 - 32 mEq/L Final  . Glucose, Bld 04/13/2016 123* 70 - 99 mg/dL Final  . BUN 04/13/2016 13  6 - 23 mg/dL Final  . Creatinine, Ser 04/13/2016 0.91  0.40 - 1.50 mg/dL Final  . Calcium 04/13/2016 9.1  8.4 - 10.5 mg/dL Final  . GFR 04/13/2016 92.45  >60.00 mL/min Final  . Microalb, Ur 04/13/2016 0.9  0.0 - 1.9 mg/dL Final  . Creatinine,U 04/13/2016 67.0  mg/dL Final  . Microalb Creat Ratio 04/13/2016 1.3  0.0 - 30.0 mg/g Final       Medication List       Accurate as of 04/18/16  8:47 PM. Always use your most recent med list.          acetaminophen 325 MG tablet Commonly known as:  TYLENOL Take 650 mg by mouth every 6 (six) hours as  needed.   cholecalciferol 1000 units tablet Commonly known as:  VITAMIN D Take 1,000 Units by mouth daily.   gabapentin 300 MG capsule Commonly known as:  NEURONTIN Take 300 mg by mouth 3 (three) times daily.   insulin lispro 100 UNIT/ML injection Commonly known as:  HUMALOG Inject 56 Units into the skin once. With V-go   metFORMIN 750 MG 24 hr tablet Commonly known as:  GLUCOPHAGE-XR Take 2 tablets (1,500 mg total) by mouth daily.   pravastatin 40 MG tablet Commonly known as:  PRAVACHOL Take 40 mg by mouth daily.   V-GO 20 Kit by Does not apply route.       Allergies: No Known Allergies  Past Medical History:  Diagnosis Date  . Arthritis    bilateral hips  . Deep vein thrombosis (DVT) ([place])   . Diabetes mellitus    type II  . 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 DJD (degenerative joint disease)   . Pulmonary emboli ([place]) 02/08/2014   Date of diagnosis February 08 2014, on chest CTA Hospitalized for 3 days Had some symptoms of shortness of breath, and chest pain With intercurrent DVT of the left LE. Duration of anticoagulation: 8 months. End date 10/11/2014.  Anticoagulant: Lovenox 120 units daily Switched to Eliquis on 05/25/2014 per patient preference   . Pulmonary embolism ([place])   . Sebaceous cyst    on back of neck    Past Surgical History:  Procedure Laterality Date  . AMPUTATION Right 06/03/2015   Procedure: Right Below Knee Amputation;  Surgeon: [name], MD;  Location: [facility];  Service: Orthopedics;  Laterality: Right;  . CLOSED REDUCTION WITH HUMER PIN INSERTION  1974   left hip  . HARDWARE REMOVAL Left 07/21/2014   Procedure: HARDWARE REMOVAL;  Surgeon: Ninetta Lights, MD;  Location: [facility];  Service: Orthopedics;  Laterality: Left;  . JOINT REPLACEMENT  2006 (approx)   right hip replaced  . ROTATOR CUFF REPAIR  2005 (approx)   right   . 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 TOTAL HIP ARTHROPLASTY Left 07/21/2014    Procedure: TOTAL HIP ARTHROPLASTY ANTERIOR APPROACH;  Surgeon: Ninetta Lights, MD;  Location: [facility];  Service: Orthopedics;  Laterality: Left;    Family History  Problem Relation Age of Onset  . Cancer Mother     breast,  colon, liver  . Diabetes Father     Social History:  reports that he has never smoked. He has never used smokeless tobacco. He reports that he drinks alcohol. He reports that he does not use drugs.  Review of Systems:   Lipids: He is on Pravachol with good control of LDL with LDL 92  Lab Results  Component Value Date   CHOL 146 12/01/2015   HDL 42.50 12/01/2015   LDLCALC 92 12/01/2015   LDLDIRECT 91.0 07/11/2015   TRIG 55.0 12/01/2015   CHOLHDL 3 12/01/2015    Lab Results  Component Value Date   ALT 50 06/07/2015    Last foot exam was in 6/17   Examination:   BP (!) 142/94   Pulse 73   Temp 98 F (36.7 C)   Resp 16   Ht _0  (1.905 m)   Wt 201 lb 12.8 oz (91.5 kg)   SpO2 96%   BMI 25.22 kg/m   Body mass index is 25.22 kg/m.   ASSESSMENT/ PLAN:     Diabetes type 2:  See history of present illness for detailed discussion of current diabetes management, blood sugar patterns and problems identified  He is Doing well with using the V-go pump with 20 unit basal Fructosamine indicates fairly good control and this is normal Previous A1c had been over 8% He is also having better post prandial control Fasting readings are better with adding metformin Also weight is better He is in somewhat better with diet and appears more motivated, will continue same regimen  Meanwhile will try to increase metformin 1500 mg for better therapeutic dosage long-term  Patient Instructions  Check blood sugars on waking up    Also check blood sugars about 2 hours after a meal and do this after different meals by rotation  Recommended blood sugar levels on waking up is 90-130 and about 2 hours after meal is 130-160  Please bring your blood sugar monitor to  each visit, thank you  Take 2 daily     Sol Odor 04/18/2016, 8:47 PM   Note: This office note was prepared with Dragon voice recognition system technology. Any transcriptional errors that result from this process are unintentional.

## 2016-05-01 ENCOUNTER — Telehealth: Payer: Self-pay | Admitting: Endocrinology

## 2016-05-01 NOTE — Telephone Encounter (Signed)
Pt needs a refill with increased dosage of the humalog because he ran out early  Can the VGo pump use extra insulin on its own?

## 2016-05-03 ENCOUNTER — Other Ambulatory Visit: Payer: Self-pay | Admitting: *Deleted

## 2016-05-03 MED ORDER — INSULIN LISPRO 100 UNIT/ML ~~LOC~~ SOLN: SUBCUTANEOUS | 3 refills | Status: DC

## 2016-05-25 ENCOUNTER — Telehealth: Payer: Self-pay

## 2016-05-28 NOTE — Telephone Encounter (Signed)
Medication Samples have been provided to the patient.  Drug: Humalog  Strength: U100 Qty: 10mL  LOT: [id number] A Exp.Date: 04/2018 Dosing instructions: Inject max 56 units per day with V-go.   The patient has been instructed regarding the correct time, dose, and frequency of taking this medication, including desired effects and most common side effects.   Samples signed for by Dr. Marzetta BoardJennifer Kim, PharmD.   Homero FellersFrank  [place] 1:40 PM 05/28/2016

## 2016-06-15 ENCOUNTER — Other Ambulatory Visit: Payer: Medicaid Other

## 2016-06-18 ENCOUNTER — Other Ambulatory Visit (INDEPENDENT_AMBULATORY_CARE_PROVIDER_SITE_OTHER): Payer: Medicaid Other

## 2016-06-18 ENCOUNTER — Ambulatory Visit: Payer: Medicaid Other | Admitting: Endocrinology

## 2016-06-18 DIAGNOSIS — E1165 Type 2 diabetes mellitus with hyperglycemia: Secondary | ICD-10-CM

## 2016-06-18 DIAGNOSIS — Z794 Long term (current) use of insulin: Secondary | ICD-10-CM

## 2016-06-18 LAB — COMPREHENSIVE METABOLIC PANEL
ALT: 63 U/L — ABNORMAL HIGH (ref 0–53)
AST: 49 U/L — ABNORMAL HIGH (ref 0–37)
Albumin: 4 g/dL (ref 3.5–5.2)
Alkaline Phosphatase: 57 U/L (ref 39–117)
BUN: 15 mg/dL (ref 6–23)
CO2: 29 mEq/L (ref 19–32)
Calcium: 9.4 mg/dL (ref 8.4–10.5)
Chloride: 102 mEq/L (ref 96–112)
Creatinine, Ser: 0.87 mg/dL (ref 0.40–1.50)
GFR: 97.3 mL/min (ref >60.00)
Glucose, Bld: 179 mg/dL — ABNORMAL HIGH (ref 70–99)
Potassium: 4 mEq/L (ref 3.5–5.1)
Sodium: 138 mEq/L (ref 135–145)
Total Bilirubin: 0.2 mg/dL (ref 0.2–1.2)
Total Protein: 7.1 g/dL (ref 6.0–8.3)

## 2016-06-18 LAB — HEMOGLOBIN A1C: Hgb A1c MFr Bld: 8 % — ABNORMAL HIGH (ref 4.6–6.5)

## 2016-06-21 ENCOUNTER — Ambulatory Visit (INDEPENDENT_AMBULATORY_CARE_PROVIDER_SITE_OTHER): Payer: Medicaid Other | Admitting: Endocrinology

## 2016-06-21 ENCOUNTER — Encounter: Payer: Self-pay | Admitting: Endocrinology

## 2016-06-21 VITALS — BP 118/82 | HR 68

## 2016-06-21 DIAGNOSIS — Z794 Long term (current) use of insulin: Secondary | ICD-10-CM

## 2016-06-21 DIAGNOSIS — E1165 Type 2 diabetes mellitus with hyperglycemia: Secondary | ICD-10-CM | POA: Diagnosis not present

## 2016-06-21 NOTE — Progress Notes (Signed)
Gregory May 55 y.o.           Reason for Appointment: Diabetes follow-up   History of Present Illness   Diagnosis: Type 2 DIABETES MELITUS, date of diagnosis:  1999     Previous history: He has previously been treated with metformin and Victoza and subsequently mealtime insulin added to control postprandial hyperglycemia Overall he has been  relatively noncompliant with his diet, medications, monitoring and followup Previously would not take Victoza regularly because of cost. Has not been taking any medications for the last year and a half because of commitments to family and cost A1c had increased to 12.3% and hyperglycemia discovered when he was hospitalized for foot ulcer. Also lost 20 pounds because of hyperglycemia   Recent history:   Insulin regimen: V-go 20 unit basal, mealtime boluses 10-14 units   A1c is 8%, previous was 8.5 in July  For better control, compliance and inconvenience he was switched to the V-go pump on 02/21/16    Management, blood sugar patterns and problems identified.  His blood sugars are overall relatively better including fasting with going up on his metformin ER to 1500 mg which he is tolerating  He is checking blood sugars mostly before his first meal and sometimes in the afternoons at variable times  Not clear which readings are postprandial in the afternoon  Has only one reading after supper at night  His blood sugars are fairly consistent most of the time before meals with only a sporadic reading that is relatively high  He thinks reading may be higher when he has snacks at night and eating late  Although he is taking generally 10-14 units with each of his 2 meals he thinks he may not have enough insulin for boluses later at night  Appears to had some improvement in his diet but difficult to adjust his weight because of his prosthesis      Proper timing of injections in relation to meals:  usually   Oral hypoglycemic drugs:  Metformin ER 1500 mg daily        Side effects from medications: None       Monitors blood glucose: Less than once a day now.  Glucometer: One Touch.          Blood Glucose readings:  By download through 06/16/16:  Mean values apply above for all meters except median for One Touch  PRE-MEAL Fasting Afternoon  Dinner Bedtime Overall  Glucose range: 100-145 93-168  99-175  197   Mean/median:     136      Meals:  usually 2 meals per day usually.  breakfast at 12 noon, evening meal 7 pm, variable snacks late at night   Physical activity: exercise: None               Weight control:   Wt Readings from Last 3 Encounters:  04/18/16 201 lb 12.8 oz (91.5 kg)  03/02/16 207 lb (93.9 kg)  01/25/16 205 lb (93 kg)           Diabetes labs:  Lab Results  Component Value Date   HGBA1C 8.0 (H) 06/18/2016   HGBA1C 8.5 03/02/2016   HGBA1C 9.0 (H) 12/01/2015   Lab Results  Component Value Date   MICROALBUR 0.9 04/13/2016   LDLCALC 92 12/01/2015   CREATININE 0.87 06/18/2016    Other active problems: See review of systems    Lab on 06/18/2016  Component Date Value Ref Range Status  . Hgb A1c  MFr Bld 06/18/2016 8.0* 4.6 - 6.5 % Final  . Sodium 06/18/2016 138  135 - 145 mEq/L Final  . Potassium 06/18/2016 4.0  3.5 - 5.1 mEq/L Final  . Chloride 06/18/2016 102  96 - 112 mEq/L Final  . CO2 06/18/2016 29  19 - 32 mEq/L Final  . Glucose, Bld 06/18/2016 179* 70 - 99 mg/dL Final  . BUN 06/18/2016 15  6 - 23 mg/dL Final  . Creatinine, Ser 06/18/2016 0.87  0.40 - 1.50 mg/dL Final  . Total Bilirubin 06/18/2016 0.2  0.2 - 1.2 mg/dL Final  . Alkaline Phosphatase 06/18/2016 57  39 - 117 U/L Final  . AST 06/18/2016 49* 0 - 37 U/L Final  . ALT 06/18/2016 63* 0 - 53 U/L Final  . Total Protein 06/18/2016 7.1  6.0 - 8.3 g/dL Final  . Albumin 06/18/2016 4.0  3.5 - 5.2 g/dL Final  . Calcium 06/18/2016 9.4  8.4 - 10.5 mg/dL Final  . GFR 06/18/2016 97.30  >60.00 mL/min Final       Medication List        Accurate as of 06/21/16 12:19 PM. Always use your most recent med list.          acetaminophen 325 MG tablet Commonly known as:  TYLENOL Take 650 mg by mouth every 6 (six) hours as needed.   cholecalciferol 1000 units tablet Commonly known as:  VITAMIN D Take 1,000 Units by mouth daily.   gabapentin 300 MG capsule Commonly known as:  NEURONTIN Take 300 mg by mouth 3 (three) times daily.   insulin lispro 100 UNIT/ML injection Commonly known as:  HUMALOG Use max 56 units per day with V-go   metFORMIN 750 MG 24 hr tablet Commonly known as:  GLUCOPHAGE-XR Take 2 tablets (1,500 mg total) by mouth daily.   pravastatin 40 MG tablet Commonly known as:  PRAVACHOL Take 40 mg by mouth daily.   V-GO 20 Kit by Does not apply route.       Allergies: No Known Allergies  Past Medical History:  Diagnosis Date  . Arthritis    bilateral hips  . Deep vein thrombosis (DVT) ([place])   . Diabetes mellitus    type II  . 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 DJD (degenerative joint disease)   . Pulmonary emboli ([place]) 02/08/2014   Date of diagnosis February 08 2014, on chest CTA Hospitalized for 3 days Had some symptoms of shortness of breath, and chest pain With intercurrent DVT of the left LE. Duration of anticoagulation: 8 months. End date 10/11/2014.  Anticoagulant: Lovenox 120 units daily Switched to Eliquis on 05/25/2014 per patient preference   . Pulmonary embolism ([place])   . Sebaceous cyst    on back of neck    Past Surgical History:  Procedure Laterality Date  . AMPUTATION Right 06/03/2015   Procedure: Right Below Knee Amputation;  Surgeon: [name], MD;  Location: [facility];  Service: Orthopedics;  Laterality: Right;  . CLOSED REDUCTION WITH HUMER PIN INSERTION  1974   left hip  . HARDWARE REMOVAL Left 07/21/2014   Procedure: HARDWARE REMOVAL;  Surgeon: Ninetta Lights, MD;  Location: [facility];  Service: Orthopedics;  Laterality: Left;  . JOINT REPLACEMENT  2006 (approx)    right hip replaced  . ROTATOR CUFF REPAIR  2005 (approx)   right   . 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 TOTAL HIP ARTHROPLASTY Left 07/21/2014  Procedure: TOTAL HIP ARTHROPLASTY ANTERIOR APPROACH;  Surgeon: Ninetta Lights, MD;  Location: [facility];  Service: Orthopedics;  Laterality: Left;    Family History  Problem Relation Age of Onset  . Cancer Mother     breast, colon, liver  . Diabetes Father     Social History:  reports that he has never smoked. He has never used smokeless tobacco. He reports that he drinks alcohol. He reports that he does not use drugs.  Review of Systems:   Lipids: He is on Pravachol long-term with good control of LDL with LDL 92  Lab Results  Component Value Date   CHOL 146 12/01/2015   HDL 42.50 12/01/2015   LDLCALC 92 12/01/2015   LDLDIRECT 91.0 07/11/2015   TRIG 55.0 12/01/2015   CHOLHDL 3 12/01/2015    abnormal liver functions: He has taken more Advil recently with scores  Lab Results  Component Value Date   ALT 63 (H) 06/18/2016   ALT 50 06/07/2015   ALT 48 06/01/2015     Last foot exam was in 6/17   Examination:   BP 118/82   Pulse 68   SpO2 96%   There is no height or weight on file to calculate BMI.   ASSESSMENT/ PLAN:     Diabetes type 2:  See history of present illness for detailed discussion of current diabetes management, blood sugar patterns and problems identified  He is doing better with his control  with using the V-go pump with 20 unit basal With increasing his metformin and his fasting readings appear to be better A1c is still 8% and this is likely be inadequate post prandial control Most likely is having high readings after supper more with his nighttime snacks He is not getting enough insulin to cover his meals and snacks in the night and difficult to know how much he needs since he has only one reading been supper and bedtime lately  He will try to check more consistently after his evening  meals and adjust boluses as needed He will need to have 2-4 units to cover nighttime snacks Also reminded him to work on reducing calories overall  LIVER function abnormalities: He will need to avoid Advil   Patient Instructions  Switch Advil to Tylenol  More clicks to cover snacks   More sugars after dinner       Gregory May 06/21/2016, 12:19 PM   Note: This office note was prepared with Dragon voice recognition system technology. Any transcriptional errors that result from this process are unintentional.

## 2016-06-21 NOTE — Patient Instructions (Addendum)
Switch Advil to Tylenol  More clicks to cover snacks   More sugars after dinner

## 2016-07-20 ENCOUNTER — Encounter: Payer: Self-pay | Admitting: Internal Medicine

## 2016-07-20 ENCOUNTER — Ambulatory Visit (INDEPENDENT_AMBULATORY_CARE_PROVIDER_SITE_OTHER): Payer: Medicaid Other | Admitting: Internal Medicine

## 2016-07-20 VITALS — BP 150/81 | HR 77 | Temp 97.7°F | Ht 72.0 in | Wt 209.7 lb

## 2016-07-20 DIAGNOSIS — G5603 Carpal tunnel syndrome, bilateral upper limbs: Secondary | ICD-10-CM | POA: Diagnosis not present

## 2016-07-20 DIAGNOSIS — E1149 Type 2 diabetes mellitus with other diabetic neurological complication: Secondary | ICD-10-CM

## 2016-07-20 DIAGNOSIS — Z23 Encounter for immunization: Secondary | ICD-10-CM

## 2016-07-20 DIAGNOSIS — Z0000 Encounter for general adult medical examination without abnormal findings: Secondary | ICD-10-CM

## 2016-07-20 DIAGNOSIS — G5600 Carpal tunnel syndrome, unspecified upper limb: Principal | ICD-10-CM

## 2016-07-20 MED ORDER — GABAPENTIN 300 MG PO CAPS: 300.0000 mg | ORAL_CAPSULE | Freq: Three times a day (TID) | ORAL | 1 refills | Status: DC

## 2016-07-20 NOTE — Progress Notes (Signed)
   CC: Tingling in hands  HPI:  Gregory May is a 55 y.o. male with PMH of T2DM, Osteoarthritis, Osteomyelitis right heel s/p right BKA, and Hx of PE/DVT who presents for follow up management of a tingling sensation in his hands.   Carpal Tunnel Syndrome: Patient reports 1-2 years history of tingling sensation in both hands, right greater than left without associated pain or numbness. He notices the tingling sensation more in his thumb and 2nd and 3rd digits and worse at night. 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 He has taken Gabapentin 300 mg TID before with some relief. He has not used wrist splints.   Healthcare Maintenance: Flu shot given today.   Past Medical History:  Diagnosis Date  . Arthritis    bilateral hips  . Deep vein thrombosis (DVT) (HCC)   . Diabetes mellitus    type II  . 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 DJD (degenerative joint disease)   . Pulmonary emboli (HCC) 02/08/2014   Date of diagnosis February 08 2014, on chest CTA Hospitalized for 3 days Had some symptoms of shortness of breath, and chest pain With intercurrent DVT of the left LE. Duration of anticoagulation: 8 months. End date 10/11/2014.  Anticoagulant: Lovenox 120 units daily Switched to Eliquis on 05/25/2014 per patient preference   . Pulmonary embolism (HCC)   . Sebaceous cyst    on back of neck    Review of Systems:   Review of Systems  Constitutional: Negative for chills and fever.  Respiratory: Negative for cough and shortness of breath.   Cardiovascular: Negative for chest pain.  Musculoskeletal: Negative for joint pain and myalgias.  Neurological: Positive for tingling. Negative for dizziness, sensory change, focal weakness and weakness.       Tingling 1st three digits both hands     Physical Exam:  Vitals:   07/20/16 1354  BP: (!) 150/81  Pulse: 77  Temp: 97.7 F (36.5 C)  TempSrc: Oral  SpO2: 95%  Weight: 209 lb 11.2 oz  (95.1 kg)  Height: 6' (1.829 m)   Physical Exam  Constitutional: He is oriented to person, place, and time. He appears well-developed and well-nourished. No distress.  Cardiovascular: Normal rate and regular rhythm.   Pulses:      Radial pulses are 2+ on the right side, and 2+ on the left side.  Pulmonary/Chest: Effort normal. No respiratory distress. He has no wheezes. He has no rales.  Musculoskeletal: Normal range of motion. He exhibits no edema or tenderness.  Positive Tinel's and Phalen's sign eliciting tingling sensation in right hand. No thenar atrophy. Finger strength intact with abduction/adduction. S/p Right BKA with prosthesis.  Neurological: He is alert and oriented to person, place, and time.  Skin: Skin is warm. He is not diaphoretic.    Assessment & Plan:   See Encounters Tab for problem based charting.  Patient discussed with Dr. Rogelia BogaButcher

## 2016-07-20 NOTE — Assessment & Plan Note (Signed)
Patient reports 1-2 years history of tingling sensation in both hands, right greater than left without associated pain or numbness. He notices the tingling sensation more in his thumb and 2nd and 3rd digits and worse at night. 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 He has taken Gabapentin 300 mg TID before with some relief. He has not used wrist splints.  Patient with positive Tinel's and Phalen's sign and typical symptoms of carpal tunnel syndrome. He does not have thenar atrophy. Radial pulses are equal and intact bilaterally. Discussed conservative therapy for now with Gabapentin, wrist brace, and antiinflammatory medicine. Patient agrees. -Refill Gabapentin 300 mg TID -Patient to obtain wrist splint/brace -OTC NSAIDs as needed -f/u with PCP in 2 months

## 2016-07-20 NOTE — Patient Instructions (Addendum)
It was a pleasure to meet you Gregory May.  It sounds like the tingling sensation you have in your hands is from Carpal Tunnel Syndrome.  I will refill your Gabapentin 300 mg three times daily to see if this helps with your discomfort.  Please try to obtain a wrist brace to help immobilize the wrists and reduce your tingling sensation.  You can try over the counter anti-inflammatory medications like Ibuprofen as needed.  We are giving you the flu shot today.   Please schedule a follow up visit with your regular doctor in 2-3 months to see how things are going.    Carpal Tunnel Syndrome Carpal tunnel syndrome is a condition that causes pain in your hand and arm. The carpal tunnel is a narrow area located on the palm side of your wrist. Repeated wrist motion or certain diseases may cause swelling within the tunnel. This swelling pinches the main nerve in the wrist (median nerve). What are the causes? This condition may be caused by:  Repeated wrist motions.  Wrist injuries.  Arthritis.  A cyst or tumor in the carpal tunnel.  Fluid buildup during pregnancy. Sometimes the cause of this condition is not known. What increases the risk? This condition is more likely to develop in:  People who have jobs that cause them to repeatedly move their wrists in the same motion, such as Health visitorbutchers and cashiers.  Women.  People with certain conditions, such as:  Diabetes.  Obesity.  An underactive thyroid (hypothyroidism).  Kidney failure. What are the signs or symptoms? Symptoms of this condition include:  A tingling feeling in your fingers, especially in your thumb, index, and middle fingers.  Tingling or numbness in your hand.  An aching feeling in your entire arm, especially when your wrist and elbow are bent for long periods of time.  Wrist pain that goes up your arm to your shoulder.  Pain that goes down into your palm or fingers.  A weak feeling in your hands. You  may have trouble grabbing and holding items. Your symptoms may feel worse during the night. How is this diagnosed? This condition is diagnosed with a medical history and physical exam. You may also have tests, including:  An electromyogram (EMG). This test measures electrical signals sent by your nerves into the muscles.  X-rays. How is this treated? Treatment for this condition includes:  Lifestyle changes. It is important to stop doing or modify the activity that caused your condition.  Physical or occupational therapy.  Medicines for pain and inflammation. This may include medicine that is injected into your wrist.  A wrist splint.  Surgery. Follow these instructions at home: If you have a splint:  Wear it as told by your health care provider. Remove it only as told by your health care provider.  Loosen the splint if your fingers become numb and tingle, or if they turn cold and blue.  Keep the splint clean and dry. General instructions  Take over-the-counter and prescription medicines only as told by your health care provider.  Rest your wrist from any activity that may be causing your pain. If your condition is work related, talk to your employer about changes that can be made, such as getting a wrist pad to use while typing.  If directed, apply ice to the painful area:  Put ice in a plastic bag.  Place a towel between your skin and the bag.  Leave the ice on for 20 minutes, 2-3 times per day.  Keep all follow-up visits as told by your health care provider. This is important.  Do any exercises as told by your health care provider, physical therapist, or occupational therapist. Contact a health care provider if:  You have new symptoms.  Your pain is not controlled with medicines.  Your symptoms get worse. This information is not intended to replace advice given to you by your health care provider. Make sure you discuss any questions you have with your health  care provider. Document Released: 07/27/2000 Document Revised: 12/08/2015 Document Reviewed: 12/15/2014 Elsevier Interactive Patient Education  2017 [employer]

## 2016-07-20 NOTE — Assessment & Plan Note (Signed)
Flu shot given today

## 2016-07-23 NOTE — Progress Notes (Signed)
[facility] Attending  Case discussed with Dr. Patel,Vishal at the time of the visit.  We reviewed the resident's history and exam and pertinent patient test results.  I agree with the assessment, diagnosis, and plan of care documented in the resident's note.  

## 2016-09-06 ENCOUNTER — Telehealth: Payer: Self-pay | Admitting: Endocrinology

## 2016-09-06 NOTE — Telephone Encounter (Signed)
40 mg pravastatin needs to be called in to [facility] on [address]

## 2016-09-07 ENCOUNTER — Other Ambulatory Visit: Payer: Self-pay

## 2016-09-07 MED ORDER — PRAVASTATIN SODIUM 40 MG PO TABS: 40.0000 mg | ORAL_TABLET | Freq: Every day | ORAL | 3 refills | Status: DC

## 2016-09-07 NOTE — Telephone Encounter (Signed)
Ordered 09/07/16 

## 2016-09-10 ENCOUNTER — Other Ambulatory Visit: Payer: Self-pay | Admitting: Endocrinology

## 2016-09-13 ENCOUNTER — Other Ambulatory Visit: Payer: Self-pay | Admitting: Internal Medicine

## 2016-09-13 DIAGNOSIS — G5600 Carpal tunnel syndrome, unspecified upper limb: Principal | ICD-10-CM

## 2016-09-13 DIAGNOSIS — E1149 Type 2 diabetes mellitus with other diabetic neurological complication: Secondary | ICD-10-CM

## 2016-09-18 ENCOUNTER — Other Ambulatory Visit (INDEPENDENT_AMBULATORY_CARE_PROVIDER_SITE_OTHER): Payer: Medicaid Other

## 2016-09-18 DIAGNOSIS — E1165 Type 2 diabetes mellitus with hyperglycemia: Secondary | ICD-10-CM

## 2016-09-18 DIAGNOSIS — Z794 Long term (current) use of insulin: Secondary | ICD-10-CM

## 2016-09-18 LAB — COMPREHENSIVE METABOLIC PANEL
ALT: 43 U/L (ref 0–53)
AST: 31 U/L (ref 0–37)
Albumin: 4.1 g/dL (ref 3.5–5.2)
Alkaline Phosphatase: 47 U/L (ref 39–117)
BUN: 15 mg/dL (ref 6–23)
CO2: 30 mEq/L (ref 19–32)
Calcium: 9.2 mg/dL (ref 8.4–10.5)
Chloride: 99 mEq/L (ref 96–112)
Creatinine, Ser: 0.86 mg/dL (ref 0.40–1.50)
GFR: 98.52 mL/min (ref >60.00)
Glucose, Bld: 266 mg/dL — ABNORMAL HIGH (ref 70–99)
Potassium: 4.1 mEq/L (ref 3.5–5.1)
Sodium: 134 mEq/L — ABNORMAL LOW (ref 135–145)
Total Bilirubin: 0.5 mg/dL (ref 0.2–1.2)
Total Protein: 7.3 g/dL (ref 6.0–8.3)

## 2016-09-18 LAB — LIPID PANEL
Cholesterol: 147 mg/dL (ref 0–200)
HDL: 43.5 mg/dL (ref >39.00)
LDL Cholesterol: 87 mg/dL (ref 0–99)
NonHDL: 103.61
Total CHOL/HDL Ratio: 3
Triglycerides: 83 mg/dL (ref 0.0–149.0)
VLDL: 16.6 mg/dL (ref 0.0–40.0)

## 2016-09-18 LAB — HEMOGLOBIN A1C: Hgb A1c MFr Bld: 8.6 % — ABNORMAL HIGH (ref 4.6–6.5)

## 2016-09-21 ENCOUNTER — Encounter: Payer: Self-pay | Admitting: Endocrinology

## 2016-09-21 ENCOUNTER — Ambulatory Visit (INDEPENDENT_AMBULATORY_CARE_PROVIDER_SITE_OTHER): Payer: Medicaid Other | Admitting: Endocrinology

## 2016-09-21 VITALS — BP 134/80 | HR 84 | Ht 72.0 in | Wt 206.0 lb

## 2016-09-21 DIAGNOSIS — E1165 Type 2 diabetes mellitus with hyperglycemia: Secondary | ICD-10-CM | POA: Diagnosis not present

## 2016-09-21 DIAGNOSIS — Z794 Long term (current) use of insulin: Secondary | ICD-10-CM

## 2016-09-21 MED ORDER — VICTOZA 18 MG/3ML ~~LOC~~ SOPN: 1.2000 mg | PEN_INJECTOR | Freq: Every day | SUBCUTANEOUS | 3 refills | Status: DC

## 2016-09-21 NOTE — Progress Notes (Signed)
Gregory May 56 y.o.           Reason for Appointment: Diabetes follow-up   History of Present Illness   Diagnosis: Type 2 DIABETES MELITUS, date of diagnosis:  1999     Previous history: He has previously been treated with metformin and Victoza and subsequently mealtime insulin added to control postprandial hyperglycemia Overall he has been  relatively noncompliant with his diet, medications, monitoring and followup Previously would not take Victoza regularly because of cost. Has not been taking any medications for the last year and a half because of commitments to family and cost A1c had increased to 12.3% and hyperglycemia discovered when he was hospitalized for foot ulcer. Also lost 20 pounds because of hyperglycemia   For better control, compliance and inconvenience he was switched to the V-go pump on 02/21/16  Recent history:   Insulin regimen: V-go 20 unit basal, mealtime boluses 12-14/18 units   A1c is 8.6%, previous was 8    Management, blood sugar patterns and problems identified.  He did not bring his blood sugars for review onhis meter  However his lab glucose was 226 after his breakfast  He is probably not checking enough readings after meals but he thinks they are periodically over 200 at night  Also he is not watching his diet carefully and eating cookies at night  His fasting readings are also relatively higher most of the time compared to the last visit  He thinks some of his high readings may be because of having a respiratory infection      Oral hypoglycemic drugs: Metformin ER 1500 mg daily        Side effects from medications: None       Monitors blood glucose: Less than once a day .  Glucometer: One Touch.          Blood Glucose readings:  By recall  Mean values apply above for all meters except median for One Touch  PRE-MEAL Fasting Lunch Dinner Bedtime Overall  Glucose range: 150-180   222   Mean/median:        POST-MEAL PC Breakfast PC  Lunch PC Dinner  Glucose range:  180   Mean/median:        Meals:  usually 2 meals per day usually.  breakfast at 12 noon, evening meal 7 pm, variable snacks late at night   Physical activity: exercise: None               Weight control:   Wt Readings from Last 3 Encounters:  09/21/16 206 lb (93.4 kg)  07/20/16 209 lb 11.2 oz (95.1 kg)  04/18/16 201 lb 12.8 oz (91.5 kg)          Diabetes labs:  Lab Results  Component Value Date   HGBA1C 8.6 (H) 09/18/2016   HGBA1C 8.0 (H) 06/18/2016   HGBA1C 8.5 03/02/2016   Lab Results  Component Value Date   MICROALBUR 0.9 04/13/2016   LDLCALC 87 09/18/2016   CREATININE 0.86 09/18/2016    Other active problems: See review of systems    Lab on 09/18/2016  Component Date Value Ref Range Status  . Hgb A1c MFr Bld 09/18/2016 8.6* 4.6 - 6.5 % Final  . Sodium 09/18/2016 134* 135 - 145 mEq/L Final  . Potassium 09/18/2016 4.1  3.5 - 5.1 mEq/L Final  . Chloride 09/18/2016 99  96 - 112 mEq/L Final  . CO2 09/18/2016 30  19 - 32 mEq/L Final  . Glucose, Bld  09/18/2016 266* 70 - 99 mg/dL Final  . BUN 09/18/2016 15  6 - 23 mg/dL Final  . Creatinine, Ser 09/18/2016 0.86  0.40 - 1.50 mg/dL Final  . Total Bilirubin 09/18/2016 0.5  0.2 - 1.2 mg/dL Final  . Alkaline Phosphatase 09/18/2016 47  39 - 117 U/L Final  . AST 09/18/2016 31  0 - 37 U/L Final  . ALT 09/18/2016 43  0 - 53 U/L Final  . Total Protein 09/18/2016 7.3  6.0 - 8.3 g/dL Final  . Albumin 09/18/2016 4.1  3.5 - 5.2 g/dL Final  . Calcium 09/18/2016 9.2  8.4 - 10.5 mg/dL Final  . GFR 09/18/2016 98.52  >60.00 mL/min Final  . Cholesterol 09/18/2016 147  0 - 200 mg/dL Final  . Triglycerides 09/18/2016 83.0  0.0 - 149.0 mg/dL Final  . HDL 09/18/2016 43.50  >39.00 mg/dL Final  . VLDL 09/18/2016 16.6  0.0 - 40.0 mg/dL Final  . LDL Cholesterol 09/18/2016 87  0 - 99 mg/dL Final  . Total CHOL/HDL Ratio 09/18/2016 3   Final  . NonHDL 09/18/2016 103.61   Final     Allergies as of 09/21/2016    No Known Allergies     Medication List       Accurate as of 09/21/16  1:46 PM. Always use your most recent med list.          acetaminophen 325 MG tablet Commonly known as:  TYLENOL Take 650 mg by mouth every 6 (six) hours as needed.   cholecalciferol 1000 units tablet Commonly known as:  VITAMIN D Take 1,000 Units by mouth daily.   gabapentin 300 MG capsule Commonly known as:  NEURONTIN TAKE ONE CAPSULE BY MOUTH THREE TIMES DAILY   insulin lispro 100 UNIT/ML injection Commonly known as:  HUMALOG Use max 56 units per day with V-go   metFORMIN 750 MG 24 hr tablet Commonly known as:  GLUCOPHAGE-XR TAKE TWO TABLETS BY MOUTH ONCE DAILY   pravastatin 40 MG tablet Commonly known as:  PRAVACHOL Take 1 tablet (40 mg total) by mouth daily.   V-GO 20 Kit by Does not apply route.       Allergies: No Known Allergies  Past Medical History:  Diagnosis Date  . Arthritis    bilateral hips  . Deep vein thrombosis (DVT) ([place])   . Diabetes mellitus    type II  . 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 DJD (degenerative joint disease)   . Pulmonary emboli ([place]) 02/08/2014   Date of diagnosis February 08 2014, on chest CTA Hospitalized for 3 days Had some symptoms of shortness of breath, and chest pain With intercurrent DVT of the left LE. Duration of anticoagulation: 8 months. End date 10/11/2014.  Anticoagulant: Lovenox 120 units daily Switched to Eliquis on 05/25/2014 per patient preference   . Pulmonary embolism ([place])   . Sebaceous cyst    on back of neck    Past Surgical History:  Procedure Laterality Date  . AMPUTATION Right 06/03/2015   Procedure: Right Below Knee Amputation;  Surgeon: [name], MD;  Location: [facility];  Service: Orthopedics;  Laterality: Right;  . CLOSED REDUCTION WITH HUMER PIN INSERTION  1974   left hip  . HARDWARE REMOVAL Left 07/21/2014   Procedure: HARDWARE REMOVAL;  Surgeon: Ninetta Lights, MD;  Location: [place];  Service: Orthopedics;   Laterality: Left;  . JOINT REPLACEMENT  2006 (approx)   right hip replaced  .  ROTATOR CUFF REPAIR  2005 (approx)   right   . 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 TOTAL HIP ARTHROPLASTY Left 07/21/2014   Procedure: TOTAL HIP ARTHROPLASTY ANTERIOR APPROACH;  Surgeon: Ninetta Lights, MD;  Location: [place] [facility];  Service: Orthopedics;  Laterality: Left;    Family History  Problem Relation Age of Onset  . Cancer Mother     breast, colon, liver  . Diabetes Father     Social History:  reports that he has never smoked. He has never used smokeless tobacco. He reports that he drinks alcohol. He reports that he does not use drugs.  Review of Systems:   Lipids: He is on Pravachol long-term with good control of LDL   Lab Results  Component Value Date   CHOL 147 09/18/2016   HDL 43.50 09/18/2016   LDLCALC 87 09/18/2016   LDLDIRECT 91.0 07/11/2015   TRIG 83.0 09/18/2016   CHOLHDL 3 09/18/2016    abnormal liver functions:  ok with stopping Advil  Lab Results  Component Value Date   ALT 43 09/18/2016   ALT 63 (H) 06/18/2016   ALT 50 06/07/2015     Last foot exam was in 6/17   Examination:   BP 134/80   Pulse 84   Ht 6' (1.829 m)   Wt 206 lb (93.4 kg)   SpO2 94%   BMI 27.94 kg/m   Body mass index is 27.94 kg/m.   ASSESSMENT/ PLAN:     Diabetes type 2:  See history of present illness for detailed discussion of current diabetes management, blood sugar patterns and problems identified  Blood sugars are not adequately controlled and A1c has been 8% or more Even though initially had done better with the V-go pump he is having fairly high readings now and mostly postprandial despite using as much as 16-18 units of mealtime coverage at night He is not watching his diet carefully especially with eating late night cookies and probably an balanced meals  He had previously done well with Victoza which was stopped because of insurance  difficulties  Recommendations:  Start improving diet and cut back on carbohydrates and sweets Discussed with the patient the nature of GLP-1 drugs, the actions on various organ systems, how they benefit blood glucose control, as well as the benefit of weight loss and  increase satiety . Explained possible side effects especially nausea and vomiting initially; discussed safety information in package insert.  Described the injection technique and dosage titration of Victoza  starting with 0.6 mg once a day at the same time for the first week and then increasing to 1.2 mg if no symptoms of nausea.  Educational brochure on Victoza and co-pay card given  No change in insulin as yet  Review in 6 weeks and also bring blood sugar monitor for download  Consider consultation with dietitian also  LIVER function back to normal   There are no Patient Instructions on file for this visit.    Gregory May 09/21/2016, 1:46 PM   Note: This office note was prepared with Dragon voice recognition system technology. Any transcriptional errors that result from this process are unintentional.

## 2016-09-21 NOTE — Patient Instructions (Signed)
Check blood sugars on waking up  2-3x per week  Also check blood sugars about 2 hours after a meal and do this after different meals by rotation  Recommended blood sugar levels on waking up is 90-130 and about 2 hours after meal is 130-160  Please bring your blood sugar monitor to each visit, thank you  Start VICTOZA injection as shown once daily at the same time of the day.  Dial the dose to 0.6 mg on the pen for the first week.  You may inject in the stomach, thigh or arm. You may experience nausea in the first few days which usually goes away.  You will feel fullness of the stomach with starting the medication and should try to keep the portions at meals small.   After 1 week increase the dose to 1.2mg  daily if no nausea present.   If any questions or concerns are present call the office or the Victoza [facility] helpline at [phone]. Visit [email or web address] for more useful information

## 2016-10-15 ENCOUNTER — Other Ambulatory Visit: Payer: Self-pay

## 2016-10-15 MED ORDER — INSULIN LISPRO 100 UNIT/ML ~~LOC~~ SOLN: SUBCUTANEOUS | 3 refills | Status: DC

## 2016-10-22 ENCOUNTER — Encounter: Payer: Medicaid Other | Admitting: Internal Medicine

## 2016-10-30 ENCOUNTER — Other Ambulatory Visit (INDEPENDENT_AMBULATORY_CARE_PROVIDER_SITE_OTHER): Payer: Medicaid Other

## 2016-10-30 DIAGNOSIS — E1165 Type 2 diabetes mellitus with hyperglycemia: Secondary | ICD-10-CM | POA: Diagnosis not present

## 2016-10-30 DIAGNOSIS — Z794 Long term (current) use of insulin: Secondary | ICD-10-CM

## 2016-10-30 LAB — BASIC METABOLIC PANEL
BUN: 13 mg/dL (ref 6–23)
CO2: 32 mEq/L (ref 19–32)
Calcium: 9.4 mg/dL (ref 8.4–10.5)
Chloride: 102 mEq/L (ref 96–112)
Creatinine, Ser: 0.97 mg/dL (ref 0.40–1.50)
GFR: 85.7 mL/min (ref >60.00)
Glucose, Bld: 129 mg/dL — ABNORMAL HIGH (ref 70–99)
Potassium: 4 mEq/L (ref 3.5–5.1)
Sodium: 139 mEq/L (ref 135–145)

## 2016-10-31 LAB — FRUCTOSAMINE: Fructosamine: 274 umol/L (ref 0–285)

## 2016-11-01 ENCOUNTER — Other Ambulatory Visit: Payer: Self-pay

## 2016-11-01 MED ORDER — EXENATIDE ER 2 MG/0.85ML ~~LOC~~ AUIJ: 2.0000 mg | AUTO-INJECTOR | SUBCUTANEOUS | 3 refills | Status: DC

## 2016-11-02 ENCOUNTER — Ambulatory Visit: Payer: Medicaid Other | Admitting: Endocrinology

## 2016-11-06 ENCOUNTER — Ambulatory Visit (INDEPENDENT_AMBULATORY_CARE_PROVIDER_SITE_OTHER): Payer: Medicaid Other | Admitting: Endocrinology

## 2016-11-06 ENCOUNTER — Encounter: Payer: Self-pay | Admitting: Endocrinology

## 2016-11-06 VITALS — BP 114/72 | HR 76 | Ht 72.0 in | Wt 207.0 lb

## 2016-11-06 DIAGNOSIS — Z794 Long term (current) use of insulin: Secondary | ICD-10-CM | POA: Diagnosis not present

## 2016-11-06 DIAGNOSIS — E1165 Type 2 diabetes mellitus with hyperglycemia: Secondary | ICD-10-CM | POA: Diagnosis not present

## 2016-11-06 NOTE — Progress Notes (Signed)
Gregory May 56 y.o.           Reason for Appointment: Diabetes follow-up   History of Present Illness   Diagnosis: Type 2 DIABETES MELITUS, date of diagnosis:  1999     Previous history: He has previously been treated with metformin and Victoza and subsequently mealtime insulin added to control postprandial hyperglycemia Overall he has been  relatively noncompliant with his diet, medications, monitoring and followup Previously would not take Victoza regularly because of cost. Has not been taking any medications for the last year and a half because of commitments to family and cost A1c had increased to 12.3% and hyperglycemia discovered when he was hospitalized for foot ulcer. Also lost 20 pounds because of hyperglycemia   For better control, compliance and inconvenience he was switched to the V-go pump on 02/21/16  Recent history:   Insulin regimen: V-go 20 unit basal, mealtime boluses 12-14 units   Last A1c was 8.6%, now fructosamine 274    Management, blood sugar patterns and problems identified.  Because of increased hyperglycemia he was started back on Victoza on his last visit but apparently he is not being covered for this by his insurance  He is checking his blood sugar very sporadically and mostly before his first meal or sometimes in the afternoon  He has sometimes had excessive snacks at night and his morning sugar may be relatively high  However overall he thinks he is cutting back on portions with starting Victoza 1.2 mg daily, he does still have a supply but he is not sure this is expired.  He has tried to bolus consistently with his meals usually, may not be covering snacks all the time  However he says that he may occasionally run out of boluses if he is frequently using the clicks and he will leave the old pump on while putting on a new pump in the morning for extra clicks      Non-insulin hypoglycemic drugs: Metformin ER 1500 mg daily, Victoza 1.2 mg  daily        Side effects from medications: None       Monitors blood glucose: Less than once a day .  Glucometer: One Touch.          Blood Glucose readings:  By   Mean values apply above for all meters except median for One Touch  PRE-MEAL Fasting Lunch Dinner Bedtime Overall  Glucose range: 122-2 24  82-120      Mean/median:     124   Meals:  usually 2 meals per day usually.  breakfast at 12 noon, evening meal 7 pm, variable snacks late at night   Physical activity: exercise: None               Weight control:   Wt Readings from Last 3 Encounters:  11/06/16 207 lb (93.9 kg)  09/21/16 206 lb (93.4 kg)  07/20/16 209 lb 11.2 oz (95.1 kg)          Diabetes labs:  Lab Results  Component Value Date   HGBA1C 8.6 (H) 09/18/2016   HGBA1C 8.0 (H) 06/18/2016   HGBA1C 8.5 03/02/2016   Lab Results  Component Value Date   MICROALBUR 0.9 04/13/2016   LDLCALC 87 09/18/2016   CREATININE 0.97 10/30/2016    Other active problems: See review of systems    No visits with results within 1 Week(s) from this visit.  Latest known visit with results is:  Lab on 10/30/2016  Component Date Value Ref Range Status  . Sodium 10/30/2016 139  135 - 145 mEq/L Final  . Potassium 10/30/2016 4.0  3.5 - 5.1 mEq/L Final  . Chloride 10/30/2016 102  96 - 112 mEq/L Final  . CO2 10/30/2016 32  19 - 32 mEq/L Final  . Glucose, Bld 10/30/2016 129* 70 - 99 mg/dL Final  . BUN 10/30/2016 13  6 - 23 mg/dL Final  . Creatinine, Ser 10/30/2016 0.97  0.40 - 1.50 mg/dL Final  . Calcium 10/30/2016 9.4  8.4 - 10.5 mg/dL Final  . GFR 10/30/2016 85.70  >60.00 mL/min Final  . Fructosamine 10/30/2016 274  0 - 285 umol/L Final   Comment: Published reference interval for apparently healthy subjects between age 54 and 97 is 80 - 285 umol/L and in a poorly controlled diabetic population is 228 - 563 umol/L with a mean of 396 umol/L.      Allergies as of 11/06/2016   No Known Allergies     Medication List         Accurate as of 11/06/16 11:59 PM. Always use your most recent med list.          acetaminophen 325 MG tablet Commonly known as:  TYLENOL Take 650 mg by mouth every 6 (six) hours as needed.   cholecalciferol 1000 units tablet Commonly known as:  VITAMIN D Take 1,000 Units by mouth daily.   Exenatide ER 2 MG/0.85ML [place] Commonly known as:  BYDUREON BCISE Inject 2 mg into the skin once a week.   gabapentin 300 MG capsule Commonly known as:  NEURONTIN TAKE ONE CAPSULE BY MOUTH THREE TIMES DAILY   insulin lispro 100 UNIT/ML injection Commonly known as:  HUMALOG Use max 56 units per day with V-go   metFORMIN 750 MG 24 hr tablet Commonly known as:  GLUCOPHAGE-XR TAKE TWO TABLETS BY MOUTH ONCE DAILY   pravastatin 40 MG tablet Commonly known as:  PRAVACHOL Take 1 tablet (40 mg total) by mouth daily.   V-GO 20 Kit by Does not apply route.   VICTOZA 18 MG/3ML Sopn Generic drug:  liraglutide Inject 0.2 mLs (1.2 mg total) into the skin daily. Inject once daily at the same time       Allergies: No Known Allergies  Past Medical History:  Diagnosis Date  . Arthritis    bilateral hips  . Deep vein thrombosis (DVT) ([place])   . Diabetes mellitus    type II  . 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 DJD (degenerative joint disease)   . Pulmonary emboli ([place]) 02/08/2014   Date of diagnosis February 08 2014, on chest CTA Hospitalized for 3 days Had some symptoms of shortness of breath, and chest pain With intercurrent DVT of the left LE. Duration of anticoagulation: 8 months. End date 10/11/2014.  Anticoagulant: Lovenox 120 units daily Switched to Eliquis on 05/25/2014 per patient preference   . Pulmonary embolism ([place])   . Sebaceous cyst    on back of neck    Past Surgical History:  Procedure Laterality Date  . AMPUTATION Right 06/03/2015   Procedure: Right Below Knee Amputation;  Surgeon: [name], MD;  Location: [place];  Service: Orthopedics;  Laterality: Right;  .  CLOSED REDUCTION WITH HUMER PIN INSERTION  1974   left hip  . HARDWARE REMOVAL Left 07/21/2014   Procedure: HARDWARE REMOVAL;  Surgeon: Ninetta Lights, MD;  Location: [place];  Service: Orthopedics;  Laterality: Left;  .  JOINT REPLACEMENT  2006 (approx)   right hip replaced  . ROTATOR CUFF REPAIR  2005 (approx)   right   . 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 TOTAL HIP ARTHROPLASTY Left 07/21/2014   Procedure: TOTAL HIP ARTHROPLASTY ANTERIOR APPROACH;  Surgeon: Ninetta Lights, MD;  Location: [place];  Service: Orthopedics;  Laterality: Left;    Family History  Problem Relation Age of Onset  . Cancer Mother     breast, colon, liver  . Diabetes Father     Social History:  reports that he has never smoked. He has never used smokeless tobacco. He reports that he drinks alcohol. He reports that he does not use drugs.  Review of Systems:   Lipids: He is on Pravachol  with good control of LDL   Lab Results  Component Value Date   CHOL 147 09/18/2016   HDL 43.50 09/18/2016   LDLCALC 87 09/18/2016   LDLDIRECT 91.0 07/11/2015   TRIG 83.0 09/18/2016   CHOLHDL 3 09/18/2016    He had transient ALT elevation  Lab Results  Component Value Date   ALT 43 09/18/2016   ALT 63 (H) 06/18/2016   ALT 50 06/07/2015     Last foot exam was in 6/17   Examination:   BP 114/72   Pulse 76   Ht 6' (1.829 m)   Wt 207 lb (93.9 kg)   SpO2 96%   BMI 28.07 kg/m   Body mass index is 28.07 kg/m.   ASSESSMENT/ PLAN:     Diabetes type 2:  See history of present illness for detailed discussion of current diabetes management, blood sugar patterns and problems identified  Blood sugars are Looking better with starting back on Victoza in addition to his V-go pump Has only a few blood sugars at home but his fructosamine is indicating overall good control now  Recommendations:  Try to continue improving diet.  He was shown how to use the Bydureon pen which she can use when he  runs out of his Victoza or if he has expired pens at home  Continue same regimen with his V-go pump  Emphasized the need to check readings after meals regularly to help adjust his boluses    Patient Instructions  Check blood sugars on waking up  3x wekly  Also check blood sugars about 2 hours after a meal and do this after different meals by rotation  Recommended blood sugar levels on waking up is 90-130 and about 2 hours after meal is 130-160  Please bring your blood sugar monitor to each visit, thank you        [facility] 11/07/2016, 9:58 AM   Note: This office note was prepared with Dragon voice recognition system technology. Any transcriptional errors that result from this process are unintentional.

## 2016-11-06 NOTE — Patient Instructions (Signed)
Check blood sugars on waking up  3x wekly  Also check blood sugars about 2 hours after a meal and do this after different meals by rotation  Recommended blood sugar levels on waking up is 90-130 and about 2 hours after meal is 130-160  Please bring your blood sugar monitor to each visit, thank you

## 2016-12-05 ENCOUNTER — Other Ambulatory Visit: Payer: Self-pay

## 2017-01-04 ENCOUNTER — Other Ambulatory Visit (INDEPENDENT_AMBULATORY_CARE_PROVIDER_SITE_OTHER): Payer: Medicaid Other

## 2017-01-04 DIAGNOSIS — Z794 Long term (current) use of insulin: Secondary | ICD-10-CM

## 2017-01-04 DIAGNOSIS — E1165 Type 2 diabetes mellitus with hyperglycemia: Secondary | ICD-10-CM | POA: Diagnosis not present

## 2017-01-04 LAB — BASIC METABOLIC PANEL
BUN: 17 mg/dL (ref 6–23)
CO2: 29 mEq/L (ref 19–32)
Calcium: 9.6 mg/dL (ref 8.4–10.5)
Chloride: 105 mEq/L (ref 96–112)
Creatinine, Ser: 1.15 mg/dL (ref 0.40–1.50)
GFR: 70.37 mL/min (ref >60.00)
Glucose, Bld: 198 mg/dL — ABNORMAL HIGH (ref 70–99)
Potassium: 4 mEq/L (ref 3.5–5.1)
Sodium: 140 mEq/L (ref 135–145)

## 2017-01-04 LAB — HEMOGLOBIN A1C: Hgb A1c MFr Bld: 8 % — ABNORMAL HIGH (ref 4.6–6.5)

## 2017-01-09 ENCOUNTER — Ambulatory Visit (INDEPENDENT_AMBULATORY_CARE_PROVIDER_SITE_OTHER): Payer: Medicaid Other | Admitting: Endocrinology

## 2017-01-09 ENCOUNTER — Encounter: Payer: Self-pay | Admitting: Endocrinology

## 2017-01-09 VITALS — BP 138/88 | HR 119 | Ht 72.0 in | Wt 206.2 lb

## 2017-01-09 DIAGNOSIS — Z794 Long term (current) use of insulin: Secondary | ICD-10-CM

## 2017-01-09 DIAGNOSIS — E1165 Type 2 diabetes mellitus with hyperglycemia: Secondary | ICD-10-CM | POA: Diagnosis not present

## 2017-01-09 NOTE — Progress Notes (Signed)
Gregory May 56 y.o.           Reason for Appointment: Diabetes follow-up   History of Present Illness   Diagnosis: Type 2 DIABETES MELITUS, date of diagnosis:  1999     Previous history: He has previously been treated with metformin and Victoza and subsequently mealtime insulin added to control postprandial hyperglycemia Overall he has been  relatively noncompliant with his diet, medications, monitoring and followup Previously would not take Victoza regularly because of cost. Has not been taking any medications for the last year and a half because of commitments to family and cost A1c had increased to 12.3% and hyperglycemia discovered when he was hospitalized for foot ulcer. Also lost 20 pounds because of hyperglycemia   For better control, compliance and inconvenience he was switched to the V-go pump on 02/21/16  Recent history:   Insulin regimen: V-go 20 unit basal, mealtime boluses 12-14 units   Last A1c was 8.6%, now it is 8%, lowest A1c has been 6.3 in 2015 Previously fructosamine 274    Management, blood sugar patterns and problems identified.  Although he is taking Victoza consistently and still has a supply his blood sugars from A1c did not appear to be well controlled  Discussed various aspects of day-to-day management today: He says that he does not change his V-go pump at the same time everyday and may wait until he runs out of insulin  He is also periodically getting into sweets such as ice cream or small candy bars  His blood sugar after breakfast and a small candy bar was 198 in the lab  However he has only a few readings after his first meal and only once after supper in the last month  He may also occasionally forget to do a bolus Before his meal  Recently his fasting readings at home have been checked only 3 times and these are fairly good      Non-insulin hypoglycemic drugs: Metformin ER 1500 mg daily, Victoza 1.2 mg daily        Side effects from  medications: None       Monitors blood glucose: Less than once a day .  Glucometer: One Touch.          Blood Glucose readings:   .Mean values apply above for all meters except median for One Touch  PRE-MEAL Fasting Lunch Dinner Bedtime Overall  Glucose range: 1 23-130   130  101    Mean/median:     130    POST-MEAL PC Breakfast PC Lunch PC Dinner  Glucose range: 125-167     Mean/median:       Meals:  usually 2 meals per day usually.  breakfast at 12 noon, evening meal 7 pm, variable snacks late at night    Physical activity: exercise: Minimal               Weight control:   Wt Readings from Last 3 Encounters:  01/09/17 206 lb 3.2 oz (93.5 kg)  11/06/16 207 lb (93.9 kg)  09/21/16 206 lb (93.4 kg)          Diabetes labs:  Lab Results  Component Value Date   HGBA1C 8.0 (H) 01/04/2017   HGBA1C 8.6 (H) 09/18/2016   HGBA1C 8.0 (H) 06/18/2016   Lab Results  Component Value Date   MICROALBUR 0.9 04/13/2016   LDLCALC 87 09/18/2016   CREATININE 1.15 01/04/2017    Other active problems: See review of systems  Lab on 01/04/2017  Component Date Value Ref Range Status  . Hgb A1c MFr Bld 01/04/2017 8.0* 4.6 - 6.5 % Final   Glycemic Control Guidelines for People with Diabetes:Non Diabetic:  <6%Goal of Therapy: <7%Additional Action Suggested:  >8%   . Sodium 01/04/2017 140  135 - 145 mEq/L Final  . Potassium 01/04/2017 4.0  3.5 - 5.1 mEq/L Final  . Chloride 01/04/2017 105  96 - 112 mEq/L Final  . CO2 01/04/2017 29  19 - 32 mEq/L Final  . Glucose, Bld 01/04/2017 198* 70 - 99 mg/dL Final  . BUN 01/04/2017 17  6 - 23 mg/dL Final  . Creatinine, Ser 01/04/2017 1.15  0.40 - 1.50 mg/dL Final  . Calcium 01/04/2017 9.6  8.4 - 10.5 mg/dL Final  . GFR 01/04/2017 70.37  >60.00 mL/min Final     Allergies as of 01/09/2017   No Known Allergies     Medication List       Accurate as of 01/09/17  9:00 PM. Always use your most recent med list.          acetaminophen 325 MG  tablet Commonly known as:  TYLENOL Take 650 mg by mouth every 6 (six) hours as needed.   cholecalciferol 1000 units tablet Commonly known as:  VITAMIN D Take 1,000 Units by mouth daily.   Exenatide ER 2 MG/0.85ML Auij Commonly known as:  BYDUREON BCISE Inject 2 mg into the skin once a week.   gabapentin 300 MG capsule Commonly known as:  NEURONTIN TAKE ONE CAPSULE BY MOUTH THREE TIMES DAILY   insulin lispro 100 UNIT/ML injection Commonly known as:  HUMALOG Use max 56 units per day with V-go   metFORMIN 750 MG 24 hr tablet Commonly known as:  GLUCOPHAGE-XR TAKE TWO TABLETS BY MOUTH ONCE DAILY   pravastatin 40 MG tablet Commonly known as:  PRAVACHOL Take 1 tablet (40 mg total) by mouth daily.   V-GO 20 Kit by Does not apply route.       Allergies: No Known Allergies  Past Medical History:  Diagnosis Date  . Arthritis    bilateral hips  . Deep vein thrombosis (DVT) ([place])   . Diabetes mellitus    type II  . 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 DJD (degenerative joint disease)   . Pulmonary emboli ([place]) 02/08/2014   Date of diagnosis February 08 2014, on chest CTA Hospitalized for 3 days Had some symptoms of shortness of breath, and chest pain With intercurrent DVT of the left LE. Duration of anticoagulation: 8 months. End date 10/11/2014.  Anticoagulant: Lovenox 120 units daily Switched to Eliquis on 05/25/2014 per patient preference   . Pulmonary embolism ([place])   . Sebaceous cyst    on back of neck    Past Surgical History:  Procedure Laterality Date  . AMPUTATION Right 06/03/2015   Procedure: Right Below Knee Amputation;  Surgeon: [name], MD;  Location: [place];  Service: Orthopedics;  Laterality: Right;  . CLOSED REDUCTION WITH HUMER PIN INSERTION  1974   left hip  . HARDWARE REMOVAL Left 07/21/2014   Procedure: HARDWARE REMOVAL;  Surgeon: Ninetta Lights, MD;  Location: [place];  Service: Orthopedics;  Laterality: Left;  . JOINT REPLACEMENT  2006  (approx)   right hip replaced  . ROTATOR CUFF REPAIR  2005 (approx)   right   . 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 TOTAL HIP ARTHROPLASTY Left 07/21/2014  Procedure: TOTAL HIP ARTHROPLASTY ANTERIOR APPROACH;  Surgeon: Ninetta Lights, MD;  Location: [place];  Service: Orthopedics;  Laterality: Left;    Family History  Problem Relation Age of Onset  . Cancer Mother        breast, colon, liver  . Diabetes Father     Social History:  reports that he has never smoked. He has never used smokeless tobacco. He reports that he drinks alcohol. He reports that he does not use drugs.  Review of Systems:   Lipids: He is on Pravachol  with good control of LDL   Lab Results  Component Value Date   CHOL 147 09/18/2016   HDL 43.50 09/18/2016   LDLCALC 87 09/18/2016   LDLDIRECT 91.0 07/11/2015   TRIG 83.0 09/18/2016   CHOLHDL 3 09/18/2016     Last foot exam was in 6/17  Has some feeling in his hands and has not improved with gabapentin, followed by PCP  Blood pressure is higher but he was stressed today   Examination:   BP 138/88 (Cuff Size: Normal)   Pulse (!) 119   Ht 6' (1.829 m)   Wt 206 lb 3.2 oz (93.5 kg)   SpO2 96%   BMI 27.97 kg/m   Body mass index is 27.97 kg/m.   ASSESSMENT/ PLAN:     Diabetes type 2:  See history of present illness for detailed discussion of current diabetes management, blood sugar patterns and problems identified  Blood sugars On his last visit was relatively better as judged by fructosamine Although his home blood sugars are still fairly good he has not done enough readings Also not clear if A1c is higher than expected and has not been below 8% in some time He is taking Victoza with his basal bolus insulin regimen on his pump but is supposed to switch to Bydureon because of insurance problems  Recommendations:  He will try to change the V-go pump consistently every 24 hours, discussed that basal rate will not last more  than 24 hours  He needs to avoid high-fat and high sugar content foods and snacks  Bolus consistently before every meal  More readings after supper  He will call if Bydureon is not as effective as Victoza and may need to prior authorize Victoza again  More consistent monitoring of blood sugar overall    Patient Instructions  Change V-go EVERY 24hrs  Check blood sugars on waking up  3/7 days  Also check blood sugars about 2 hours after a meal and do this after different meals by rotation  Recommended blood sugar levels on waking up is 90-130 and about 2 hours after meal is 130-160  Please bring your blood sugar monitor to each visit, thank you  Click before each meal and snack      Gregory May 01/09/2017, 9:00 PM   Note: This office note was prepared with Estate agent. Any transcriptional errors that result from this process are unintentional.

## 2017-01-09 NOTE — Patient Instructions (Addendum)
Change V-go EVERY 24hrs  Check blood sugars on waking up  3/7 days  Also check blood sugars about 2 hours after a meal and do this after different meals by rotation  Recommended blood sugar levels on waking up is 90-130 and about 2 hours after meal is 130-160  Please bring your blood sugar monitor to each visit, thank you  Click before each meal and snack

## 2017-01-16 ENCOUNTER — Other Ambulatory Visit: Payer: Self-pay | Admitting: Internal Medicine

## 2017-01-16 ENCOUNTER — Telehealth: Payer: Self-pay | Admitting: Endocrinology

## 2017-01-16 DIAGNOSIS — G5600 Carpal tunnel syndrome, unspecified upper limb: Principal | ICD-10-CM

## 2017-01-16 DIAGNOSIS — E1149 Type 2 diabetes mellitus with other diabetic neurological complication: Secondary | ICD-10-CM

## 2017-01-16 NOTE — Telephone Encounter (Signed)
**  Remind patient they can make refill requests via MyChart**  Medication refill request (Name & Dosage):  gabapentin (NEURONTIN) 300 MG capsule metFORMIN (GLUCOPHAGE-XR) 750 MG 24 hr tablet  Preferred pharmacy (Name & Address):  [facility] [place], [place] - [id number]   Other comments (if applicable):   Insurance needs more info for scripts. Please address.

## 2017-01-16 NOTE — Telephone Encounter (Signed)
Gabapentin is from his PCP

## 2017-01-16 NOTE — Telephone Encounter (Signed)
Please advise if gabapentin is okay to refill, last filled from a different provider.

## 2017-01-17 NOTE — Telephone Encounter (Signed)
This is for Metformin

## 2017-01-21 ENCOUNTER — Other Ambulatory Visit: Payer: Self-pay

## 2017-01-22 ENCOUNTER — Other Ambulatory Visit: Payer: Self-pay | Admitting: Internal Medicine

## 2017-01-22 DIAGNOSIS — G5600 Carpal tunnel syndrome, unspecified upper limb: Principal | ICD-10-CM

## 2017-01-22 DIAGNOSIS — E1149 Type 2 diabetes mellitus with other diabetic neurological complication: Secondary | ICD-10-CM

## 2017-01-22 NOTE — Telephone Encounter (Signed)
Refill Request ° ° °gabapentin (NEURONTIN) 300 MG capsule °

## 2017-01-23 MED ORDER — GABAPENTIN 300 MG PO CAPS: 300.0000 mg | ORAL_CAPSULE | Freq: Three times a day (TID) | ORAL | 6 refills | Status: DC

## 2017-01-25 ENCOUNTER — Telehealth: Payer: Self-pay | Admitting: Endocrinology

## 2017-01-25 ENCOUNTER — Other Ambulatory Visit: Payer: Self-pay

## 2017-01-25 MED ORDER — EXENATIDE ER 2 MG/0.85ML ~~LOC~~ AUIJ: 2.0000 mg | AUTO-INJECTOR | SUBCUTANEOUS | 3 refills | Status: DC

## 2017-01-25 NOTE — Telephone Encounter (Signed)
This has been ordered 

## 2017-01-25 NOTE — Telephone Encounter (Signed)
**  Remind patient they can make refill requests via MyChart**  Medication refill request (Name & Dosage): Exenatide ER (BYDUREON BCISE) 2 MG/0.85ML Virl CageyAUIJ [[id number]][[id number]]     Preferred pharmacy (Name & Address): [facility] [place], [place] - [address] [phone] (Phone) [phone] (Fax)       Other comments (if applicable):

## 2017-01-28 ENCOUNTER — Telehealth: Payer: Self-pay

## 2017-01-28 ENCOUNTER — Telehealth: Payer: Self-pay | Admitting: Internal Medicine

## 2017-01-28 ENCOUNTER — Other Ambulatory Visit: Payer: Self-pay

## 2017-01-28 MED ORDER — EXENATIDE ER 2 MG ~~LOC~~ SRER: 2.0000 mg | SUBCUTANEOUS | 4 refills | Status: DC

## 2017-01-28 NOTE — Telephone Encounter (Signed)
Left patient a vm requesting a call back to discuss instructions on how to use bydureon pen

## 2017-01-28 NOTE — Telephone Encounter (Signed)
**  Gregory May patient   Pharmacy needs to know if they can use the change the Exenatide ER (BYDUREON) 2 MG SRER from suspension to the syringe. Please advise.

## 2017-01-28 NOTE — Telephone Encounter (Signed)
Switched the patient to bydureon vial 2 mg weekly and the patient has been notified

## 2017-01-28 NOTE — Telephone Encounter (Signed)
Hey, not sure what you discussed with him earlier but sending to you for this one since you were taking care of him. Thank you!

## 2017-01-31 ENCOUNTER — Other Ambulatory Visit: Payer: Self-pay

## 2017-02-04 NOTE — Telephone Encounter (Signed)
This has been resolved

## 2017-02-11 ENCOUNTER — Encounter: Payer: Medicaid Other | Admitting: Internal Medicine

## 2017-02-12 ENCOUNTER — Other Ambulatory Visit: Payer: Self-pay | Admitting: *Deleted

## 2017-02-12 MED ORDER — PRAVASTATIN SODIUM 40 MG PO TABS: 40.0000 mg | ORAL_TABLET | Freq: Every day | ORAL | 3 refills | Status: DC

## 2017-03-05 ENCOUNTER — Other Ambulatory Visit: Payer: Self-pay | Admitting: Endocrinology

## 2017-03-05 ENCOUNTER — Telehealth: Payer: Self-pay | Admitting: Endocrinology

## 2017-03-05 NOTE — Telephone Encounter (Signed)
Refill submitted. 

## 2017-03-05 NOTE — Telephone Encounter (Signed)
**  Remind patient they can make refill requests via MyChart**  Medication refill request (Name & Dosage):  HUMALOG 100 UNIT/ML injection  Preferred pharmacy (Name & Address):  [facility] [place], [place] - [address] [phone] (Phone) [phone] (Fax)   Other comments (if applicable):

## 2017-03-06 ENCOUNTER — Other Ambulatory Visit: Payer: Self-pay

## 2017-03-06 MED ORDER — INSULIN ASPART 100 UNIT/ML ~~LOC~~ SOLN: SUBCUTANEOUS | 4 refills | Status: DC

## 2017-03-07 ENCOUNTER — Other Ambulatory Visit (INDEPENDENT_AMBULATORY_CARE_PROVIDER_SITE_OTHER): Payer: Medicaid Other

## 2017-03-07 DIAGNOSIS — Z794 Long term (current) use of insulin: Secondary | ICD-10-CM

## 2017-03-07 DIAGNOSIS — E1165 Type 2 diabetes mellitus with hyperglycemia: Secondary | ICD-10-CM | POA: Diagnosis not present

## 2017-03-07 LAB — BASIC METABOLIC PANEL
BUN: 11 mg/dL (ref 6–23)
CO2: 28 mEq/L (ref 19–32)
Calcium: 9.2 mg/dL (ref 8.4–10.5)
Chloride: 100 mEq/L (ref 96–112)
Creatinine, Ser: 0.96 mg/dL (ref 0.40–1.50)
GFR: 86.62 mL/min (ref >60.00)
Glucose, Bld: 145 mg/dL — ABNORMAL HIGH (ref 70–99)
Potassium: 4 mEq/L (ref 3.5–5.1)
Sodium: 135 mEq/L (ref 135–145)

## 2017-03-07 LAB — MICROALBUMIN / CREATININE URINE RATIO
Creatinine,U: 73.2 mg/dL
Microalb Creat Ratio: 1.2 mg/g (ref 0.0–30.0)
Microalb, Ur: 0.9 mg/dL (ref 0.0–1.9)

## 2017-03-08 LAB — FRUCTOSAMINE: Fructosamine: 243 umol/L (ref 0–285)

## 2017-03-08 NOTE — Telephone Encounter (Signed)
Patient called in reference to pharmacy stating they needed more information from Dr Lucianne [facility]Kumar [facility]. Please call pharmacy and advise.

## 2017-03-08 NOTE — Telephone Encounter (Signed)
Called to confirm the pt's appt, he stated he is almost out of his humalog and needs assistance with the matter below as soon as we can please.

## 2017-03-11 ENCOUNTER — Ambulatory Visit (INDEPENDENT_AMBULATORY_CARE_PROVIDER_SITE_OTHER): Payer: Medicaid Other | Admitting: Endocrinology

## 2017-03-11 ENCOUNTER — Telehealth: Payer: Self-pay

## 2017-03-11 ENCOUNTER — Encounter: Payer: Self-pay | Admitting: Endocrinology

## 2017-03-11 ENCOUNTER — Telehealth: Payer: Self-pay | Admitting: Endocrinology

## 2017-03-11 VITALS — BP 110/74 | HR 72 | Ht 72.0 in | Wt 201.2 lb

## 2017-03-11 DIAGNOSIS — E1165 Type 2 diabetes mellitus with hyperglycemia: Secondary | ICD-10-CM

## 2017-03-11 DIAGNOSIS — Z794 Long term (current) use of insulin: Secondary | ICD-10-CM

## 2017-03-11 NOTE — Telephone Encounter (Signed)
Please advise 

## 2017-03-11 NOTE — Progress Notes (Signed)
Gregory May 56 y.o.           Reason for Appointment: Diabetes follow-up   History of Present Illness   Diagnosis: Type 2 DIABETES MELITUS, date of diagnosis:  1999     Previous history: He has previously been treated with metformin and Victoza and subsequently mealtime insulin added to control postprandial hyperglycemia Overall he has been  relatively noncompliant with his diet, medications, monitoring and followup Previously would not take Victoza regularly because of cost. Has not been taking any medications for the last year and a half because of commitments to family and cost A1c had increased to 12.3% and hyperglycemia discovered when he was hospitalized for foot ulcer. Also lost 20 pounds because of hyperglycemia   For better control, compliance and inconvenience he was switched to the V-go pump on 02/21/16  Recent history:   Insulin regimen: V-go 20 unit basal, mealtime boluses 12-20 units   Last A1c was 8%, lowest A1c has been 6.3 in 2015 fructosamine 249, previously 274    Management, blood sugar patterns and problems identified.  He was advised to start improving his diet on his last visit when he was getting more sweets and snacks like candy bars  Although he thinks he is cutting out the sweets he is sometimes eating large amounts of carbohydrates or eating pizza  He appears to be taking as much as 20 units of bolus with one of his meals based on his intake  However has not checked his blood sugars after supper  Despite taking Bydureon instead of Victoza his portions are not smaller but he appears to be losing weight  He did have some nausea for a couple of days after his third injection of Bydureon but not last injection  Also asking about a nodule on his left abdomen from this  He has been asked to change his pump consistently every 24 hours and he is trying to do this  Did not bring his monitor for download  FASTING blood sugars are reportedly  fairly good at home, lab glucose 145      Non-insulin hypoglycemic drugs: Metformin ER 1500 mg daily, Bydureon 2 mg weekly        Side effects from medications: None       Monitors blood glucose: Less than once a day .  Glucometer: One Touch.          Blood Glucose readings by recall:   .Mean values apply above for all meters except median for One Touch  PRE-MEAL Fasting Lunch Dinner Bedtime Overall  Glucose range: 89-155    ?   Mean/median:        POST-MEAL PC Breakfast PC Lunch PC Dinner  Glucose range:  130-188   Mean/median:       Meals:  usually 2 meals per day usually.  breakfast at 12 noon, evening meal 7-9 pm, variable snacks late at night    Physical activity: exercise: Minimal               Weight control:   Wt Readings from Last 3 Encounters:  03/11/17 201 lb 3.2 oz (91.3 kg)  01/09/17 206 lb 3.2 oz (93.5 kg)  11/06/16 207 lb (93.9 kg)          Diabetes labs:  Lab Results  Component Value Date   HGBA1C 8.0 (H) 01/04/2017   HGBA1C 8.6 (H) 09/18/2016   HGBA1C 8.0 (H) 06/18/2016   Lab Results  Component Value Date  MICROALBUR 0.9 03/07/2017   LDLCALC 87 09/18/2016   CREATININE 0.96 03/07/2017    Lab Results  Component Value Date   FRUCTOSAMINE 243 03/07/2017   FRUCTOSAMINE 274 10/30/2016   FRUCTOSAMINE 260 04/13/2016     Other active problems: See review of systems    Lab on 03/07/2017  Component Date Value Ref Range Status  . Sodium 03/07/2017 135  135 - 145 mEq/L Final  . Potassium 03/07/2017 4.0  3.5 - 5.1 mEq/L Final  . Chloride 03/07/2017 100  96 - 112 mEq/L Final  . CO2 03/07/2017 28  19 - 32 mEq/L Final  . Glucose, Bld 03/07/2017 145* 70 - 99 mg/dL Final  . BUN 03/07/2017 11  6 - 23 mg/dL Final  . Creatinine, Ser 03/07/2017 0.96  0.40 - 1.50 mg/dL Final  . Calcium 03/07/2017 9.2  8.4 - 10.5 mg/dL Final  . GFR 03/07/2017 86.62  >60.00 mL/min Final  . Fructosamine 03/07/2017 243  0 - 285 umol/L Final   Comment: Published reference  interval for apparently healthy subjects between age 59 and 89 is 23 - 285 umol/L and in a poorly controlled diabetic population is 228 - 563 umol/L with a mean of 396 umol/L.   Marland Kitchen Microalb, Ur 03/07/2017 0.9  0.0 - 1.9 mg/dL Final  . Creatinine,U 03/07/2017 73.2  mg/dL Final  . Microalb Creat Ratio 03/07/2017 1.2  0.0 - 30.0 mg/g Final     Allergies as of 03/11/2017   No Known Allergies     Medication List       Accurate as of 03/11/17  2:45 PM. Always use your most recent med list.          acetaminophen 325 MG tablet Commonly known as:  TYLENOL Take 650 mg by mouth every 6 (six) hours as needed.   cholecalciferol 1000 units tablet Commonly known as:  VITAMIN D Take 1,000 Units by mouth daily.   Exenatide ER 2 MG Srer Commonly known as:  BYDUREON Inject 2 mg into the skin once a week.   gabapentin 300 MG capsule Commonly known as:  NEURONTIN Take 1 capsule (300 mg total) by mouth 3 (three) times daily.   insulin aspart 100 UNIT/ML injection Commonly known as:  NOVOLOG USE 56 UNITS DAILY IN V-GO PUMP   metFORMIN 750 MG 24 hr tablet Commonly known as:  GLUCOPHAGE-XR TAKE TWO TABLETS BY MOUTH ONCE DAILY   pravastatin 40 MG tablet Commonly known as:  PRAVACHOL Take 1 tablet (40 mg total) by mouth daily.   V-GO 20 Kit by Does not apply route.       Allergies: No Known Allergies  Past Medical History:  Diagnosis Date  . Arthritis    bilateral hips  . Deep vein thrombosis (DVT) ([place])   . Diabetes mellitus    type II  . 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 DJD (degenerative joint disease)   . Pulmonary emboli ([place]) 02/08/2014   Date of diagnosis February 08 2014, on chest CTA Hospitalized for 3 days Had some symptoms of shortness of breath, and chest pain With intercurrent DVT of the left LE. Duration of anticoagulation: 8 months. End date 10/11/2014.  Anticoagulant: Lovenox 120 units daily Switched to Eliquis on 05/25/2014 per patient preference   .  Pulmonary embolism ([place])   . Sebaceous cyst    on back of neck    Past Surgical History:  Procedure Laterality Date  . AMPUTATION Right 06/03/2015   Procedure:  Right Below Knee Amputation;  Surgeon: [name], MD;  Location: [place];  Service: Orthopedics;  Laterality: Right;  . CLOSED REDUCTION WITH HUMER PIN INSERTION  1974   left hip  . HARDWARE REMOVAL Left 07/21/2014   Procedure: HARDWARE REMOVAL;  Surgeon: Ninetta Lights, MD;  Location: [place];  Service: Orthopedics;  Laterality: Left;  . JOINT REPLACEMENT  2006 (approx)   right hip replaced  . ROTATOR CUFF REPAIR  2005 (approx)   right   . 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 TOTAL HIP ARTHROPLASTY Left 07/21/2014   Procedure: TOTAL HIP ARTHROPLASTY ANTERIOR APPROACH;  Surgeon: Ninetta Lights, MD;  Location: [place];  Service: Orthopedics;  Laterality: Left;    Family History  Problem Relation Age of Onset  . Cancer Mother        breast, colon, liver  . Diabetes Father     Social History:  reports that he has never smoked. He has never used smokeless tobacco. He reports that he drinks alcohol. He reports that he does not use drugs.  Review of Systems:   Lipids: He is on Pravachol  with good control of LDL   Lab Results  Component Value Date   CHOL 147 09/18/2016   HDL 43.50 09/18/2016   LDLCALC 87 09/18/2016   LDLDIRECT 91.0 07/11/2015   TRIG 83.0 09/18/2016   CHOLHDL 3 09/18/2016     Last foot exam was in 6/17 He is asking for new diabetic shoes   Examination:   BP 110/74   Pulse 72   Ht 6' (1.829 m)   Wt 201 lb 3.2 oz (91.3 kg)   SpO2 95%   BMI 27.29 kg/m   Body mass index is 27.29 kg/m.   ASSESSMENT/ PLAN:     Diabetes type 2:  See history of present illness for detailed discussion of current diabetes management, blood sugar patterns and problems identified  Blood sugars have been relatively better as judged by fructosamine, he also thinks his blood sugars are fairly good  at home although checking mostly fasting and after lunch His last A1c was 8%  Most likely he is doing better because of improved diet Still requiring relatively large amounts of mealtime insulin although he is taking Bydureon now   Recommendations:  He will try to check blood sugars after evening meals more consistently to help adjust his boluses  Continue watching sweets and start cutting back on carbohydrates to avoid post prandial hyperglycemia  He continues to have any nausea with Bydureon he will call and we'll change back to Victoza  History of diabetic complications: Has neuropathy but microalbumin is normal   Patient Instructions  More sugars after supper     Astaria Nanez 03/11/2017, 2:45 PM   Note: This office note was prepared with Dragon voice recognition system technology. Any transcriptional errors that result from this process are unintentional.

## 2017-03-11 NOTE — Telephone Encounter (Signed)
Patient has ben switched to novolog

## 2017-03-11 NOTE — Patient Instructions (Signed)
More sugars after supper 

## 2017-03-11 NOTE — Telephone Encounter (Signed)
Spoke with the patient and he stated that Humalog was ordered by mistake but I could not find that in his chart- I informed him that novolog was ordered 03/06/17 to replace humalog since that is what is covered on insurance

## 2017-03-11 NOTE — Telephone Encounter (Signed)
[facility] said they would send the forms for the diabetic shoes and we need to right him a rx with the forms please have done by 8/21

## 2017-03-18 ENCOUNTER — Encounter: Payer: Self-pay | Admitting: Internal Medicine

## 2017-03-18 ENCOUNTER — Ambulatory Visit (INDEPENDENT_AMBULATORY_CARE_PROVIDER_SITE_OTHER): Payer: Medicaid Other | Admitting: Internal Medicine

## 2017-03-18 VITALS — BP 123/70 | HR 66 | Temp 97.6°F | Ht 72.0 in | Wt 205.3 lb

## 2017-03-18 DIAGNOSIS — Z79899 Other long term (current) drug therapy: Secondary | ICD-10-CM

## 2017-03-18 DIAGNOSIS — Z808 Family history of malignant neoplasm of other organs or systems: Secondary | ICD-10-CM | POA: Diagnosis not present

## 2017-03-18 DIAGNOSIS — E118 Type 2 diabetes mellitus with unspecified complications: Secondary | ICD-10-CM | POA: Diagnosis not present

## 2017-03-18 DIAGNOSIS — Z0000 Encounter for general adult medical examination without abnormal findings: Secondary | ICD-10-CM

## 2017-03-18 DIAGNOSIS — E1165 Type 2 diabetes mellitus with hyperglycemia: Secondary | ICD-10-CM

## 2017-03-18 DIAGNOSIS — E7800 Pure hypercholesterolemia, unspecified: Secondary | ICD-10-CM | POA: Diagnosis not present

## 2017-03-18 DIAGNOSIS — G5600 Carpal tunnel syndrome, unspecified upper limb: Secondary | ICD-10-CM | POA: Insufficient documentation

## 2017-03-18 DIAGNOSIS — Z833 Family history of diabetes mellitus: Secondary | ICD-10-CM

## 2017-03-18 DIAGNOSIS — Z9641 Presence of insulin pump (external) (internal): Secondary | ICD-10-CM | POA: Diagnosis not present

## 2017-03-18 DIAGNOSIS — G5601 Carpal tunnel syndrome, right upper limb: Secondary | ICD-10-CM | POA: Diagnosis not present

## 2017-03-18 LAB — POCT GLYCOSYLATED HEMOGLOBIN (HGB A1C): Hemoglobin A1C: 6.8

## 2017-03-18 LAB — GLUCOSE, CAPILLARY: Glucose-Capillary: 124 mg/dL — ABNORMAL HIGH (ref 65–99)

## 2017-03-18 NOTE — Assessment & Plan Note (Signed)
Pt follows with Dr Lucianne MussKumar of Endocrinology who he saw on July 30th but did not bring his meter there. He is currently on the V-Go pump with 20 units of Lantus and 12-20 units mealtime insulin, as well as metformin and bydureon.  He brought his meter and readings showed one single value of CBG 54 on July 8th AM and pt sid that he was outside working and had not eaten and it was symptomatic. There were no other low readings. Pt feels well and says he is doing well on the current insulin regimen. A1c checked was 6.8 today.  Plan -continue to follow up with Endocrine and appreciate their recs. Will not titrate insulin today as his numbers are within range except one single value of 54.

## 2017-03-18 NOTE — Assessment & Plan Note (Addendum)
Patient was seen in Dec 2017 for carpal tunnel syndrome. Today, he continues to have tingling and numbness in 1st three fingers of right hand and. He was advised to wear wrist split at night but has not obtained it. On exam, positive tinel and phalen sign.  A: carpal tunnel syndrome   Plan -advised to wear nighttime wrist splint- and a wrist splint was given to the patient from our clinic -NSAIDs as needed  -gabapentin -follow up in 3 months. May need steroid injection/referral to orthopedics.

## 2017-03-18 NOTE — Patient Instructions (Signed)
Thank you for your visit today  Please wear the split at night time, and take ibuprofen or needed.  Please follow up in 3  Months to re-assess symptoms. We will refer you for an injection if your symptoms do not improve.  Please follow up with Dr Lucianne MussKumar as scheduled and bring your meter there  Please do the colonoscopy   Please continue your current medications  Please follow up in 3 months

## 2017-03-18 NOTE — Assessment & Plan Note (Signed)
Patient takes pravastatin daly and has LDL of 87. He may benefit from change to high intensity statin but does not wish to change it currently.  Plan -address at future visit

## 2017-03-18 NOTE — Progress Notes (Signed)
    CC: DM, Carpal tunnel, HM, HLD HPI: [name] is a 56 y.o. man with PMH noted below here for DM, Carpal tunnel, HM, HLD   Please see Problem List/A&P for the status of the patient's chronic medical problems   Past Medical History:  Diagnosis Date  . Arthritis    bilateral hips  . Deep vein thrombosis (DVT) (HCC)   . Diabetes mellitus    type II  . 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 DJD (degenerative joint disease)   . Pulmonary emboli (HCC) 02/08/2014   Date of diagnosis February 08 2014, on chest CTA Hospitalized for 3 days Had some symptoms of shortness of breath, and chest pain With intercurrent DVT of the left LE. Duration of anticoagulation: 8 months. End date 10/11/2014.  Anticoagulant: Lovenox 120 units daily Switched to Eliquis on 05/25/2014 per patient preference   . Pulmonary embolism (HCC)   . Sebaceous cyst    on back of neck    Review of Systems:  Constitutional: Negative for fever, chills, weight loss and malaise/fatigue.  HEENT: No headaches, vision problems,  Respiratory: Negative for cough, shortness of breath and wheezing.  Gastrointestinal: Negative for heartburn, nausea, vomiting, abdominal pain  Physical Exam: Vitals:   03/18/17 1335  BP: 123/70  Pulse: 66  Temp: 97.6 F (36.4 C)  TempSrc: Oral  SpO2: 97%  Weight: 205 lb 4.8 oz (93.1 kg)  Height: 6' (1.829 m)    General: A&O, in NAD Neck: supple, midline trachea,  CV: RRR, normal s1, s2, no m/r/g,  Resp: equal and symmetric breath sounds, no wheezing or crackles Abdomen: soft, nontender, nondistended, +BS Extremities: right AKA, left pulses intact. Left foot exam does not reveal any ulcers or skin breakdown   Assessment & Plan:   See encounters tab for problem based medical decision making.  Patient discussed with Dr. Heide SparkNarendra

## 2017-03-18 NOTE — Assessment & Plan Note (Signed)
Pt is due for colonoscopy- referral placed. -referral to opthalmology -referral to dentist

## 2017-03-19 ENCOUNTER — Encounter: Payer: Self-pay | Admitting: *Deleted

## 2017-03-20 ENCOUNTER — Telehealth: Payer: Self-pay | Admitting: *Deleted

## 2017-03-20 NOTE — Telephone Encounter (Signed)
LEFT VOICE MESSAGE REGARDING DENTAL REFERRAL. LIST MAILED TO PATIENT WITH MEDICAID DENTAL OFFICES. OUR PATIENT WITH PRIVATE INSURANCE MAKE THEIR OWN APPOINTMENTS. WE WILL ASSIST IF NEED BE.

## 2017-03-20 NOTE — Progress Notes (Signed)
Internal Medicine Clinic Attending  Case discussed with Dr. Saraiya at the time of the visit.  We reviewed the resident's history and exam and pertinent patient test results.  I agree with the assessment, diagnosis, and plan of care documented in the resident's note.  

## 2017-03-20 NOTE — Telephone Encounter (Signed)
SPOKE WITH PATIENT REGARDING HIS GI REFERRAL. [facility] LEFT VOICE MESSAGE FOR PATIENT.  CONTACTED PATIENT FOR HIM TO CALL [facility] TO SET UP HIS APPOINTMENT. AND SPOKE WITH HIM ABOUT HIS DENTAL.  [facility] PHONE NUMBER LEFT ON CELL [phone] PATIENT REQUEST.

## 2017-03-26 ENCOUNTER — Encounter: Payer: Self-pay | Admitting: *Deleted

## 2017-04-09 ENCOUNTER — Telehealth: Payer: Self-pay | Admitting: Endocrinology

## 2017-04-09 ENCOUNTER — Telehealth (INDEPENDENT_AMBULATORY_CARE_PROVIDER_SITE_OTHER): Payer: Self-pay

## 2017-04-09 NOTE — Telephone Encounter (Signed)
[facility] faxed over paperwork for the medical clearance for the patient to get diabetic shoes. Patient needs the rx faxed back soon. Patient's insurance ends soon and he needs this completed. Call patient to advise.

## 2017-04-09 NOTE — Telephone Encounter (Signed)
No

## 2017-04-09 NOTE — Telephone Encounter (Signed)
Routing to you °

## 2017-04-09 NOTE — Telephone Encounter (Signed)
Do you have this form?

## 2017-04-09 NOTE — Telephone Encounter (Signed)
Have you received an completed this paperwork? Please advise.

## 2017-04-09 NOTE — Telephone Encounter (Signed)
Do not have any forms.

## 2017-04-09 NOTE — Telephone Encounter (Signed)
Patient called to schedule an appointment with Dr. Lajoyce Corners

## 2017-04-09 NOTE — Telephone Encounter (Signed)
Calling for office notes from 07/30 RE diabetic shoes. They never heard back about the forms for it.  Ty,  -LL

## 2017-04-10 ENCOUNTER — Telehealth: Payer: Self-pay | Admitting: Endocrinology

## 2017-04-10 NOTE — Telephone Encounter (Signed)
Just got forms today and they are on Dr. Remus BlakeKumar's desk for [employer]

## 2017-04-10 NOTE — Telephone Encounter (Signed)
Noted! Thank you

## 2017-04-10 NOTE — Telephone Encounter (Signed)
Called patient and left a voice message to let him know that I have faxed the completed paperwork to the [facility] for his diabetic shoes.

## 2017-04-10 NOTE — Telephone Encounter (Signed)
Patient called in wanting to send a huge thank you to you for pushing his paperwork through for him!

## 2017-04-10 NOTE — Telephone Encounter (Signed)
Called patient and let him know that we just received the paperwork to be filled out and he asked that we let him know once this has been faxed to them once complete.

## 2017-04-17 ENCOUNTER — Ambulatory Visit (INDEPENDENT_AMBULATORY_CARE_PROVIDER_SITE_OTHER): Payer: Medicaid Other | Admitting: Orthopedic Surgery

## 2017-04-17 ENCOUNTER — Encounter (INDEPENDENT_AMBULATORY_CARE_PROVIDER_SITE_OTHER): Payer: Self-pay | Admitting: Orthopedic Surgery

## 2017-04-17 DIAGNOSIS — E1142 Type 2 diabetes mellitus with diabetic polyneuropathy: Secondary | ICD-10-CM

## 2017-04-17 DIAGNOSIS — Z89511 Acquired absence of right leg below knee: Secondary | ICD-10-CM | POA: Insufficient documentation

## 2017-04-17 DIAGNOSIS — L97211 Non-pressure chronic ulcer of right calf limited to breakdown of skin: Secondary | ICD-10-CM | POA: Diagnosis not present

## 2017-04-17 DIAGNOSIS — Z89512 Acquired absence of left leg below knee: Secondary | ICD-10-CM | POA: Insufficient documentation

## 2017-04-17 HISTORY — DX: Non-pressure chronic ulcer of right calf limited to breakdown of skin: L97.211

## 2017-04-17 NOTE — Progress Notes (Signed)
Office Visit Note   Patient: Gregory May           Date of Birth: [birth year]           MRN: [id number] Visit Date: 04/17/2017              Requested by: Deneise LeverSaraiya, Parth, MD [address] [place], [place] [id number] PCP: Deneise LeverSaraiya, Parth, MD  Chief Complaint  Patient presents with  . Right Leg - Pain      HPI: Patient is a 56 year old gentleman diabetic insensate neuropathy with a right transtibial amputation is developed ulcer from poor fitting socket over the fibular head also complains of some in bearing pain over the distal aspect of the fibula. [facility].  Assessment & Plan: Visit Diagnoses:  1. Acquired absence of right leg below knee (HCC)   2. Non-pressure chronic ulcer of right calf, limited to breakdown of skin (HCC)   3. Diabetic polyneuropathy associated with type 2 diabetes mellitus (HCC)     Plan: Patient given a prescription for [facility] for a new socket, liner,  new stump shrinker as well as materials as well as an extra-depth shoe on the left and custom orthotics for the left foot.  Follow-Up Instructions: Return if symptoms worsen or fail to improve.   Ortho Exam  Patient is alert, oriented, no adenopathy, well-dressed, normal affect, normal respiratory effort. Examination patient has an antalgic gait. Examination the right transtibial amputation he does have some callus this was debrided with a 10 blade knife there is no open ulcer no drainage no signs of infection. He has a superficial ulcer over the fibular head laterally on the right. This is due to an bearing on the residual limb. The distal fibula has no ulcers no cellulitis no signs of infection. Service the left foot he does have onychomycotic nails as well as skin fungal infection a 10 blade knife was used to repair the callus there is no signs of any open wounds or ulcers.  Imaging: No results found. No images are attached to the  encounter.  Labs: Lab Results  Component Value Date   HGBA1C 6.8 03/18/2017   HGBA1C 8.0 (H) 01/04/2017   HGBA1C 8.6 (H) 09/18/2016   ESRSEDRATE 56 (H) 06/01/2015   REPTSTATUS 06/07/2015 FINAL 06/01/2015   CULT  06/01/2015    NO GROWTH 5 DAYS [facility]     Orders:  No orders of the defined types were placed in this encounter.  No orders of the defined types were placed in this encounter.    Procedures: No procedures performed  Clinical Data: No additional findings.  ROS:  All other systems negative, except as noted in the HPI. Review of Systems  Objective: Vital Signs: There were no vitals taken for this visit.  Specialty Comments:  No specialty comments available.  PMFS History: Patient Active Problem List   Diagnosis Date Noted  . Non-pressure chronic ulcer of right calf, limited to breakdown of skin (HCC) 04/17/2017  . Acquired absence of right leg below knee (HCC) 04/17/2017  . Carpal tunnel syndrome 03/18/2017  . Healthcare maintenance 06/21/2015  . Status post below knee amputation of right lower extremity (HCC) 06/07/2015  . Diabetic polyneuropathy associated with type 2 diabetes mellitus (HCC) 12/09/2014  . Diabetes mellitus with carpal tunnel syndrome (HCC) 12/09/2014  . DJD (degenerative joint disease) of knee 07/21/2014  . Osteoarthritis, hip, bilateral 05/25/2014  . Pulmonary emboli (  HCC) 02/08/2014  . DVT (deep venous thrombosis) (HCC) 02/08/2014  . Pure hypercholesterolemia 09/25/2013  . Diabetic foot ulcer (HCC) 09/15/2013  . Poorly controlled type 2 diabetes mellitus with complication (HCC) 09/15/1997   Past Medical History:  Diagnosis Date  . Arthritis    bilateral hips  . Deep vein thrombosis (DVT) (HCC)   . Diabetes mellitus    type II  . 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 DJD (degenerative joint disease)   . Pulmonary emboli (HCC) 02/08/2014   Date of diagnosis February 08 2014, on chest CTA  Hospitalized for 3 days Had some symptoms of shortness of breath, and chest pain With intercurrent DVT of the left LE. Duration of anticoagulation: 8 months. End date 10/11/2014.  Anticoagulant: Lovenox 120 units daily Switched to Eliquis on 05/25/2014 per patient preference   . Pulmonary embolism (HCC)   . Sebaceous cyst    on back of neck    Family History  Problem Relation Age of Onset  . Cancer Mother        breast, colon, liver  . Diabetes Father     Past Surgical History:  Procedure Laterality Date  . AMPUTATION Right 06/03/2015   Procedure: Right Below Knee Amputation;  Surgeon: Nadara Mustard, MD;  [facility];  Service: Orthopedics;  Laterality: Right;  . CLOSED REDUCTION WITH HUMER PIN INSERTION  1974   left hip  . HARDWARE REMOVAL Left 07/21/2014   Procedure: HARDWARE REMOVAL;  Surgeon: Loreta Ave, MD;  Location: [facility] OR;  Service: Orthopedics;  Laterality: Left;  . JOINT REPLACEMENT  2006 (approx)   right hip replaced  . ROTATOR CUFF REPAIR  2005 (approx)   right   . 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 TOTAL HIP ARTHROPLASTY Left 07/21/2014   Procedure: TOTAL HIP ARTHROPLASTY ANTERIOR APPROACH;  Surgeon: Loreta Ave, MD;  Location: [facility] OR;  Service: Orthopedics;  Laterality: Left;   Social History   Occupational History  . Not on file.   Social History Main Topics  . Smoking status: Never Smoker  . Smokeless tobacco: Never Used  . Alcohol use 0.0 oz/week     Comment: occasional  . Drug use: No  . Sexual activity: No

## 2017-04-22 NOTE — Telephone Encounter (Signed)
I have sent the completed forms from [facility] and let the patient know.

## 2017-04-22 NOTE — Telephone Encounter (Signed)
I do not have the forms, must have been sent

## 2017-04-22 NOTE — Telephone Encounter (Signed)
Can you find out if these have been completed

## 2017-04-22 NOTE — Telephone Encounter (Signed)
Can you find out if this has been sent

## 2017-04-25 NOTE — Addendum Note (Signed)
Addended by: Neomia DearPOWERS, Aizlynn Digilio E on: 04/25/2017 06:07 PM   Modules accepted: Orders

## 2017-05-15 ENCOUNTER — Encounter: Payer: Self-pay | Admitting: Internal Medicine

## 2017-05-22 ENCOUNTER — Telehealth: Payer: Self-pay | Admitting: Endocrinology

## 2017-05-22 NOTE — Telephone Encounter (Signed)
Pt is asking what he is supposed to do with the vgo - is he to take it off every 24 hours or is he to take it off when the medication runs out

## 2017-05-22 NOTE — Telephone Encounter (Signed)
Called patient and let him know the message from Dr. Lucianne Muss and he stated that he understood and verbally stated that he will change the pump every 24 hours and he has syringes to give insulin if his VGo 20 runs out of insulin.

## 2017-05-22 NOTE — Telephone Encounter (Signed)
Called patient and he stated that some days he runs out of insulin because he ate more than normal and some days he has extra insulin in the VGo 20 because he did not eat as much. Not sure if he can afford the VGo kits but he was wondering if he needs to go to the VGo 30? Please advise.

## 2017-05-22 NOTE — Telephone Encounter (Signed)
He cannot use the V-go 30 because of his morning sugars being normal.  He can take extra insulin with a syringe for his meals if he is going over the boluses.  Also needs to change the pump every 24-hour regardless

## 2017-05-24 ENCOUNTER — Other Ambulatory Visit: Payer: Self-pay | Admitting: Endocrinology

## 2017-06-13 ENCOUNTER — Encounter: Payer: Medicaid Other | Admitting: Endocrinology

## 2017-06-13 ENCOUNTER — Other Ambulatory Visit (INDEPENDENT_AMBULATORY_CARE_PROVIDER_SITE_OTHER): Payer: Medicare HMO

## 2017-06-13 DIAGNOSIS — Z794 Long term (current) use of insulin: Secondary | ICD-10-CM

## 2017-06-13 DIAGNOSIS — E1165 Type 2 diabetes mellitus with hyperglycemia: Secondary | ICD-10-CM

## 2017-06-13 LAB — HEMOGLOBIN A1C: Hgb A1c MFr Bld: 6.5 % (ref 4.6–6.5)

## 2017-06-13 LAB — COMPREHENSIVE METABOLIC PANEL
ALT: 50 U/L (ref 0–53)
AST: 35 U/L (ref 0–37)
Albumin: 3.8 g/dL (ref 3.5–5.2)
Alkaline Phosphatase: 39 U/L (ref 39–117)
BUN: 14 mg/dL (ref 6–23)
CO2: 30 mEq/L (ref 19–32)
Calcium: 8.9 mg/dL (ref 8.4–10.5)
Chloride: 104 mEq/L (ref 96–112)
Creatinine, Ser: 0.92 mg/dL (ref 0.40–1.50)
GFR: 90.89 mL/min (ref >60.00)
Glucose, Bld: 114 mg/dL — ABNORMAL HIGH (ref 70–99)
Potassium: 4.1 mEq/L (ref 3.5–5.1)
Sodium: 139 mEq/L (ref 135–145)
Total Bilirubin: 0.4 mg/dL (ref 0.2–1.2)
Total Protein: 6.8 g/dL (ref 6.0–8.3)

## 2017-06-13 NOTE — Progress Notes (Signed)
This encounter was created in error - please disregard.

## 2017-06-17 ENCOUNTER — Encounter: Payer: Medicaid Other | Admitting: Internal Medicine

## 2017-06-17 ENCOUNTER — Ambulatory Visit: Payer: Medicaid Other | Admitting: Endocrinology

## 2017-06-19 ENCOUNTER — Encounter: Payer: Self-pay | Admitting: Internal Medicine

## 2017-06-20 ENCOUNTER — Ambulatory Visit (INDEPENDENT_AMBULATORY_CARE_PROVIDER_SITE_OTHER): Payer: Medicare HMO | Admitting: Endocrinology

## 2017-06-20 ENCOUNTER — Encounter: Payer: Self-pay | Admitting: Endocrinology

## 2017-06-20 VITALS — BP 140/80 | HR 112 | Ht 72.0 in | Wt 203.0 lb

## 2017-06-20 DIAGNOSIS — E1165 Type 2 diabetes mellitus with hyperglycemia: Secondary | ICD-10-CM

## 2017-06-20 DIAGNOSIS — Z794 Long term (current) use of insulin: Secondary | ICD-10-CM

## 2017-06-20 DIAGNOSIS — Z23 Encounter for immunization: Secondary | ICD-10-CM

## 2017-06-20 NOTE — Progress Notes (Signed)
Gregory May 56 y.o.           Reason for Appointment: Diabetes follow-up   History of Present Illness   Diagnosis: Type 2 DIABETES MELITUS, date of diagnosis:  1999     Previous history: He has previously been treated with metformin and Victoza and subsequently mealtime insulin added to control postprandial hyperglycemia Overall he has been  relatively noncompliant with his diet, medications, monitoring and followup Previously would not take Victoza regularly because of cost. Has not been taking any medications for the last year and a half because of commitments to family and cost A1c had increased to 12.3% and hyperglycemia discovered when he was hospitalized for foot ulcer. Also lost 20 pounds because of hyperglycemia   For better control, compliance and inconvenience he was switched to the V-go pump on 02/21/16  Recent history:   Insulin regimen: V-go 20 unit basal, mealtime boluses 10 breakfast ---16-20 units dinner   Highest A1c was 8% this year, lowest A1c has been 6.3 in 2015 ; it is now down to 6.5      Management, blood sugar patterns and problems identified.  He believes he is doing better on his diet with cutting back on high-fat high carbohydrate foods and snacks and also sweets  He continues to take Bydureon and with this he thinks his portions are somewhat better controlled although he has not lost any weight  He appears to be more motivated to take care of his diabetes and diet but not clear how often he is monitoring  Again forgot to bring his monitor for download  He thinks his FASTING blood sugars are fairly close to normal and lab glucose was 114  He is rarely getting blood sugars over 160 or 170 and none over 200 after meals although he is not likely checking her sugars consistently after meals  He thinks that he may sometimes run out of insulin for his boluses even though he has 36 units available per day, may be getting extra boluses for snacks; in  that case if he is eating out he was put another insulin pump on him and use that for the extra boluses  He may have had only 2 episodes where he felt shaky and blood sugar was around 70 in the afternoon      Non-insulin hypoglycemic drugs: Metformin ER 1500 mg daily, Bydureon 2 mg weekly        Side effects from medications: None       Monitors blood glucose: Less than once a day .  Glucometer: One Touch.          Blood Glucose readings by recall:   .Mean values apply above for all meters except median for One Touch  PRE-MEAL Fasting Lunch Dinner Bedtime Overall  Glucose range: 94-130  70+ ?   Mean/median:        POST-MEAL PC Breakfast PC Lunch PC Dinner  Glucose range:   160-170  Mean/median:       Meals:  usually 2 meals per day usually.  breakfast at 12 noon, evening meal 7-9 pm, variable snacks late at night    Physical activity: exercise: Minimal               Weight control:   Wt Readings from Last 3 Encounters:  06/20/17 203 lb (92.1 kg)  03/18/17 205 lb 4.8 oz (93.1 kg)  03/11/17 201 lb 3.2 oz (91.3 kg)  Diabetes labs:  Lab Results  Component Value Date   HGBA1C 6.5 06/13/2017   HGBA1C 6.8 03/18/2017   HGBA1C 8.0 (H) 01/04/2017   Lab Results  Component Value Date   MICROALBUR 0.9 03/07/2017   LDLCALC 87 09/18/2016   CREATININE 0.92 06/13/2017    Lab Results  Component Value Date   FRUCTOSAMINE 243 03/07/2017   FRUCTOSAMINE 274 10/30/2016   FRUCTOSAMINE 260 04/13/2016     Other active problems: See review of systems    No visits with results within 1 Week(s) from this visit.  Latest known visit with results is:  Lab on 06/13/2017  Component Date Value Ref Range Status  . Hgb A1c MFr Bld 06/13/2017 6.5  4.6 - 6.5 % Final   Glycemic Control Guidelines for People with Diabetes:Non Diabetic:  <6%Goal of Therapy: <7%Additional Action Suggested:  >8%   . Sodium 06/13/2017 139  135 - 145 mEq/L Final  . Potassium 06/13/2017 4.1  3.5 - 5.1  mEq/L Final  . Chloride 06/13/2017 104  96 - 112 mEq/L Final  . CO2 06/13/2017 30  19 - 32 mEq/L Final  . Glucose, Bld 06/13/2017 114* 70 - 99 mg/dL Final  . BUN 06/13/2017 14  6 - 23 mg/dL Final  . Creatinine, Ser 06/13/2017 0.92  0.40 - 1.50 mg/dL Final  . Total Bilirubin 06/13/2017 0.4  0.2 - 1.2 mg/dL Final  . Alkaline Phosphatase 06/13/2017 39  39 - 117 U/L Final  . AST 06/13/2017 35  0 - 37 U/L Final  . ALT 06/13/2017 50  0 - 53 U/L Final  . Total Protein 06/13/2017 6.8  6.0 - 8.3 g/dL Final  . Albumin 06/13/2017 3.8  3.5 - 5.2 g/dL Final  . Calcium 06/13/2017 8.9  8.4 - 10.5 mg/dL Final  . GFR 06/13/2017 90.89  >60.00 mL/min Final     Allergies as of 06/20/2017   No Known Allergies     Medication List        Accurate as of 06/20/17  4:10 PM. Always use your most recent med list.          acetaminophen 325 MG tablet Commonly known as:  TYLENOL Take 650 mg by mouth every 6 (six) hours as needed.   cholecalciferol 1000 units tablet Commonly known as:  VITAMIN D Take 1,000 Units by mouth daily.   Exenatide ER 2 MG Srer Commonly known as:  BYDUREON Inject 2 mg into the skin once a week.   gabapentin 300 MG capsule Commonly known as:  NEURONTIN Take 1 capsule (300 mg total) by mouth 3 (three) times daily.   insulin aspart 100 UNIT/ML injection Commonly known as:  NOVOLOG USE 56 UNITS DAILY IN V-GO PUMP   metFORMIN 750 MG 24 hr tablet Commonly known as:  GLUCOPHAGE-XR TAKE 2 TABLETS BY MOUTH ONCE DAILY   pravastatin 40 MG tablet Commonly known as:  PRAVACHOL Take 1 tablet (40 mg total) by mouth daily.   V-GO 20 Kit by Does not apply route.       Allergies: No Known Allergies  Past Medical History:  Diagnosis Date  . Arthritis    bilateral hips  . Deep vein thrombosis (DVT) (HCC)   . Diabetes mellitus    type II  . 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 DJD (degenerative joint disease)   . Pulmonary emboli (HCC) 02/08/2014   Date of  diagnosis February 08 2014, on chest CTA Hospitalized for 3 days Had   some symptoms of shortness of breath, and chest pain With intercurrent DVT of the left LE. Duration of anticoagulation: 8 months. End date 10/11/2014.  Anticoagulant: Lovenox 120 units daily Switched to Eliquis on 05/25/2014 per patient preference   . Pulmonary embolism (HCC)   . Sebaceous cyst    on back of neck    Past Surgical History:  Procedure Laterality Date  . CLOSED REDUCTION WITH HUMER PIN INSERTION  1974   left hip  . JOINT REPLACEMENT  2006 (approx)   right hip replaced  . ROTATOR CUFF REPAIR  2005 (approx)   right   . TOTAL HIP ARTHROPLASTY Left 07/21/2014   DR MURPHY    Family History  Problem Relation Age of Onset  . Cancer Mother        breast, colon, liver  . Diabetes Father     Social History:  reports that  has never smoked. he has never used smokeless tobacco. He reports that he drinks alcohol. He reports that he does not use drugs.  Review of Systems:   Lipids: He is on Pravachol  with good control of LDL This is also followed by PCP   Lab Results  Component Value Date   CHOL 147 09/18/2016   HDL 43.50 09/18/2016   LDLCALC 87 09/18/2016   LDLDIRECT 91.0 07/11/2015   TRIG 83.0 09/18/2016   CHOLHDL 3 09/18/2016    He continues to take gabapentin    Examination:   BP 140/80   Pulse (!) 112   Ht 6' (1.829 m)   Wt 203 lb (92.1 kg)   SpO2 95%   BMI 27.53 kg/m   Body mass index is 27.53 kg/m.   ASSESSMENT/ PLAN:     Diabetes type 2:  See history of present illness for detailed discussion of current diabetes management, blood sugar patterns and problems identified  Blood sugars have been relatively better as judged by his A1c the last 2 times Most likely his improvement is related to his improve diet with the help of Bydureon which he is tolerating well Unable to review his blood sugar readings at home but he thinks they are recently good Still appears to be needing  significant amount of mealtime insulin coverage  He is however concerned about the potential high cost of Bydureon when he goes into Medicare this month Ideally he should be on Ozempic   Recommendations:  He will continue same regimen and reminded him to check more readings after meals  He will let us know if he has a less expensive alternative to Bydureon  Otherwise follow-up in 3 months again  As discussed in history of present illness he can continue to use a second pump for additional boluses if he is not getting enough on the first pump for an evening meal  Influenza vaccine given  There are no Patient Instructions on file for this visit.    KUMAR,AJAY 06/20/2017, 4:10 PM   Note: This office note was prepared with Dragon voice recognition system technology. Any transcriptional errors that result from this process are unintentional.  

## 2017-06-20 NOTE — Addendum Note (Signed)
Addended by: Bobbye RiggsWALKER, LISA L on: 06/20/2017 05:45 PM   Modules accepted: Orders

## 2017-06-21 DIAGNOSIS — E1165 Type 2 diabetes mellitus with hyperglycemia: Secondary | ICD-10-CM | POA: Diagnosis not present

## 2017-06-24 ENCOUNTER — Other Ambulatory Visit: Payer: Self-pay | Admitting: Endocrinology

## 2017-06-25 ENCOUNTER — Ambulatory Visit (INDEPENDENT_AMBULATORY_CARE_PROVIDER_SITE_OTHER): Payer: Medicare HMO | Admitting: Orthopedic Surgery

## 2017-06-25 ENCOUNTER — Encounter (INDEPENDENT_AMBULATORY_CARE_PROVIDER_SITE_OTHER): Payer: Self-pay | Admitting: Orthopedic Surgery

## 2017-06-25 VITALS — Ht 72.0 in | Wt 203.0 lb

## 2017-06-25 DIAGNOSIS — L97211 Non-pressure chronic ulcer of right calf limited to breakdown of skin: Secondary | ICD-10-CM | POA: Diagnosis not present

## 2017-06-25 DIAGNOSIS — Z89511 Acquired absence of right leg below knee: Secondary | ICD-10-CM | POA: Diagnosis not present

## 2017-06-25 NOTE — Progress Notes (Signed)
Office Visit Note   Patient: Gregory May           Date of Birth: [birth year]           MRN: [id number] Visit Date: 06/25/2017              Requested by: Deneise LeverSaraiya, Parth, MD [address] [place], [place] [id number] PCP: Deneise LeverSaraiya, Parth, MD  Chief Complaint  Patient presents with  . Right Leg - Pain    06/03/15 right BKA      HPI: Patient is a 56 year old gentleman who is status post right transtibial amputation [facility] is working with his prosthetic.  He has 2 new socks to wear over his liner he has a small distal wound from N bearing.  He is wearing the medical stump shrinker against the skin.  Assessment & Plan: Visit Diagnoses:  1. Acquired absence of right leg below knee (HCC)   2. Non-pressure chronic ulcer of right calf, limited to breakdown of skin (HCC)     Plan: Recommended continue with the new sock liners recommended minimizing use of his leg until this heals.  Discussed that the ulcer gets any larger than he needs to go to [facility] and have pads placed within the socket to unload the end of the residual limb.  Follow-Up Instructions: Return if symptoms worsen or fail to improve.   Ortho Exam  Patient is alert, oriented, no adenopathy, well-dressed, normal affect, normal respiratory effort. Examination patient has an N bearing ulcer on the right transtibial amputation which is 3 mm in diameter 1 mm deep there is good granulation tissue no cellulitis no signs of infection.  He has no proximal ulcers around the knee.  Examination the left leg he has one small hyperkeratotic lesion this was pared had good petechial bleeding no signs of an abscess.  Patient does have dry cracked skin on his foot and heel but no open ulcers no cellulitis no swelling.  Patient does have a few abrasions over the tibia one is healed and what is new from a battery cable.  Imaging: No results found. No images are attached to the encounter.  Labs: Lab Results  Component Value Date   HGBA1C 6.5 06/13/2017   HGBA1C 6.8 03/18/2017   HGBA1C 8.0 (H) 01/04/2017   ESRSEDRATE 56 (H) 06/01/2015   REPTSTATUS 06/07/2015 FINAL 06/01/2015   CULT  06/01/2015    NO GROWTH 5 DAYS [facility]     Orders:  No orders of the defined types were placed in this encounter.  No orders of the defined types were placed in this encounter.    Procedures: No procedures performed  Clinical Data: No additional findings.  ROS:  All other systems negative, except as noted in the HPI. Review of Systems  Objective: Vital Signs: Ht 6' (1.829 m)   Wt 203 lb (92.1 kg)   BMI 27.53 kg/m   Specialty Comments:  No specialty comments available.  PMFS History: Patient Active Problem List   Diagnosis Date Noted  . Non-pressure chronic ulcer of right calf, limited to breakdown of skin (HCC) 04/17/2017  . Acquired absence of right leg below knee (HCC) 04/17/2017  . Carpal tunnel syndrome 03/18/2017  . Healthcare maintenance 06/21/2015  . Status post below knee amputation of right lower extremity (HCC) 06/07/2015  . Diabetic polyneuropathy associated with type 2 diabetes mellitus (HCC) 12/09/2014  . Diabetes mellitus with carpal tunnel syndrome (HCC) 12/09/2014  . DJD (degenerative joint disease) of  knee 07/21/2014  . Osteoarthritis, hip, bilateral 05/25/2014  . Pulmonary emboli (HCC) 02/08/2014  . DVT (deep venous thrombosis) (HCC) 02/08/2014  . Pure hypercholesterolemia 09/25/2013  . Diabetic foot ulcer (HCC) 09/15/2013  . Poorly controlled type 2 diabetes mellitus with complication (HCC) 09/15/1997   Past Medical History:  Diagnosis Date  . Arthritis    bilateral hips  . Deep vein thrombosis (DVT) (HCC)   . Diabetes mellitus    type II  . 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 DJD (degenerative joint disease)   . Pulmonary emboli (HCC) 02/08/2014   Date of diagnosis February 08 2014, on chest CTA Hospitalized for 3 days Had some symptoms of shortness  of breath, and chest pain With intercurrent DVT of the left LE. Duration of anticoagulation: 8 months. End date 10/11/2014.  Anticoagulant: Lovenox 120 units daily Switched to Eliquis on 05/25/2014 per patient preference   . Pulmonary embolism (HCC)   . Sebaceous cyst    on back of neck    Family History  Problem Relation Age of Onset  . Cancer Mother        breast, colon, liver  . Diabetes Father     Past Surgical History:  Procedure Laterality Date  . CLOSED REDUCTION WITH HUMER PIN INSERTION  1974   left hip  . JOINT REPLACEMENT  2006 (approx)   right hip replaced  . ROTATOR CUFF REPAIR  2005 (approx)   right   . TOTAL HIP ARTHROPLASTY Left 07/21/2014   DR MURPHY   Social History   Occupational History  . Not on file  Tobacco Use  . Smoking status: Never Smoker  . Smokeless tobacco: Never Used  Substance and Sexual Activity  . Alcohol use: Yes    Alcohol/week: 0.0 oz    Comment: occasional  . Drug use: No  . Sexual activity: No

## 2017-07-08 ENCOUNTER — Other Ambulatory Visit: Payer: Self-pay

## 2017-07-08 ENCOUNTER — Telehealth: Payer: Self-pay | Admitting: Endocrinology

## 2017-07-08 MED ORDER — INSULIN LISPRO 100 UNIT/ML ~~LOC~~ SOLN: 56.0000 [IU] | Freq: Three times a day (TID) | SUBCUTANEOUS | 3 refills | Status: DC

## 2017-07-08 NOTE — Telephone Encounter (Signed)
kayla calling from [facility] need to know when did patient receive his insulin pump (need a date)  [phone]

## 2017-07-08 NOTE — Telephone Encounter (Signed)
[facility] [place] and let her know that according to Dr. Emelda BrothersKumars last OV I saw the patient started the VGo pump on 02/21/2016.

## 2017-07-19 ENCOUNTER — Telehealth: Payer: Self-pay | Admitting: Endocrinology

## 2017-07-19 ENCOUNTER — Other Ambulatory Visit: Payer: Self-pay

## 2017-07-19 MED ORDER — INSULIN ASPART 100 UNIT/ML ~~LOC~~ SOLN: SUBCUTANEOUS | 4 refills | Status: DC

## 2017-07-19 NOTE — Telephone Encounter (Signed)
Pt states he received a [facility] from [facility] stating they do not cover Humalog and to contact the provider. Call pt to advise.

## 2017-07-19 NOTE — Telephone Encounter (Signed)
Please advise if okay to switch to Novolog?

## 2017-07-19 NOTE — Telephone Encounter (Signed)
Okay to switch, same dose

## 2017-07-19 NOTE — Telephone Encounter (Signed)
Called patient and he stated that he used to use the Novolog. I will send a new prescription.

## 2017-08-03 IMAGING — DX DG KNEE COMPLETE 4+V*R*
5 series · 5 of 5 positions shown · non-contrast
Comparison: None.

CLINICAL DATA: Acute right knee pain after fall on [REDACTED] doing
physical therapy. Initial encounter.

EXAM:
RIGHT KNEE - COMPLETE 4+ VIEW

[t knee ap right]
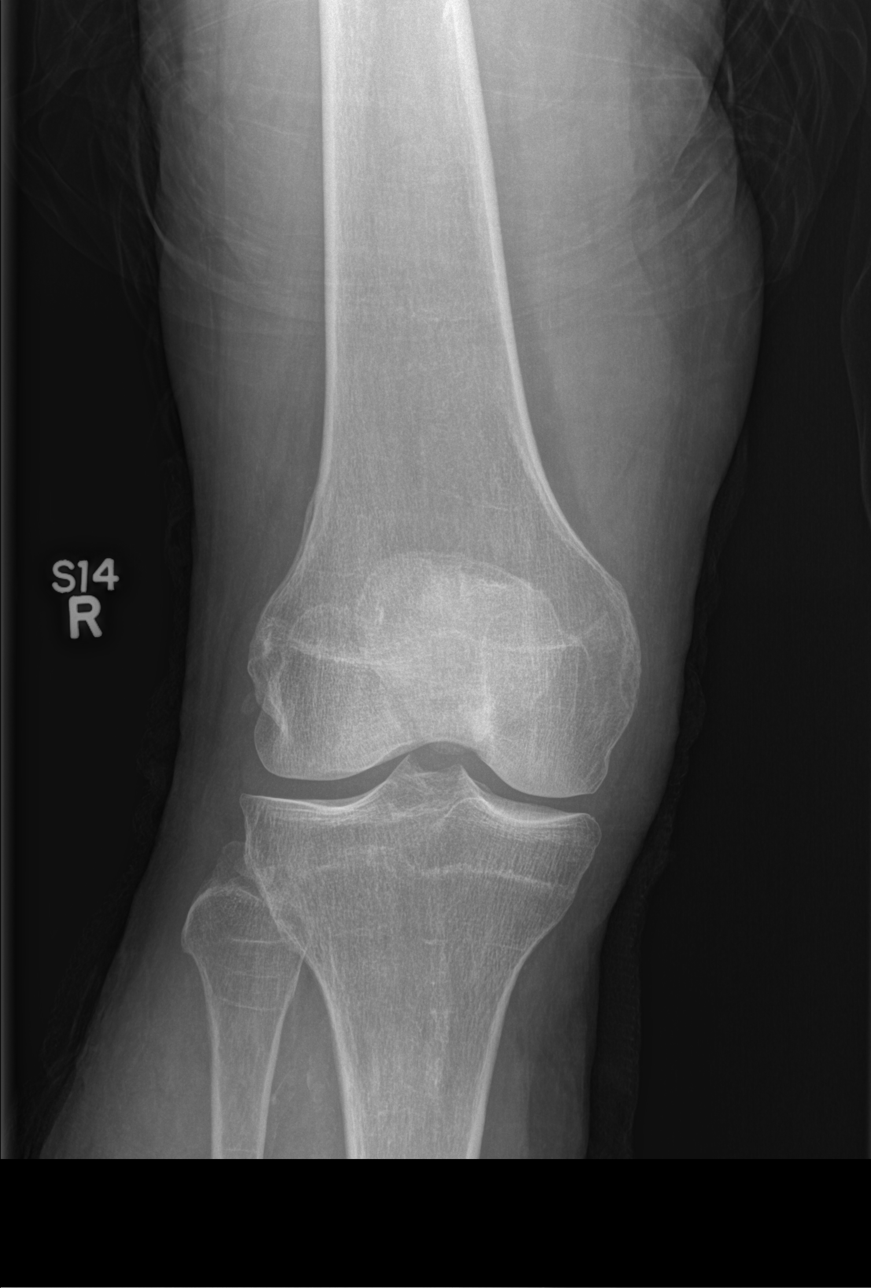

[t knee obl right (1 of 2)]
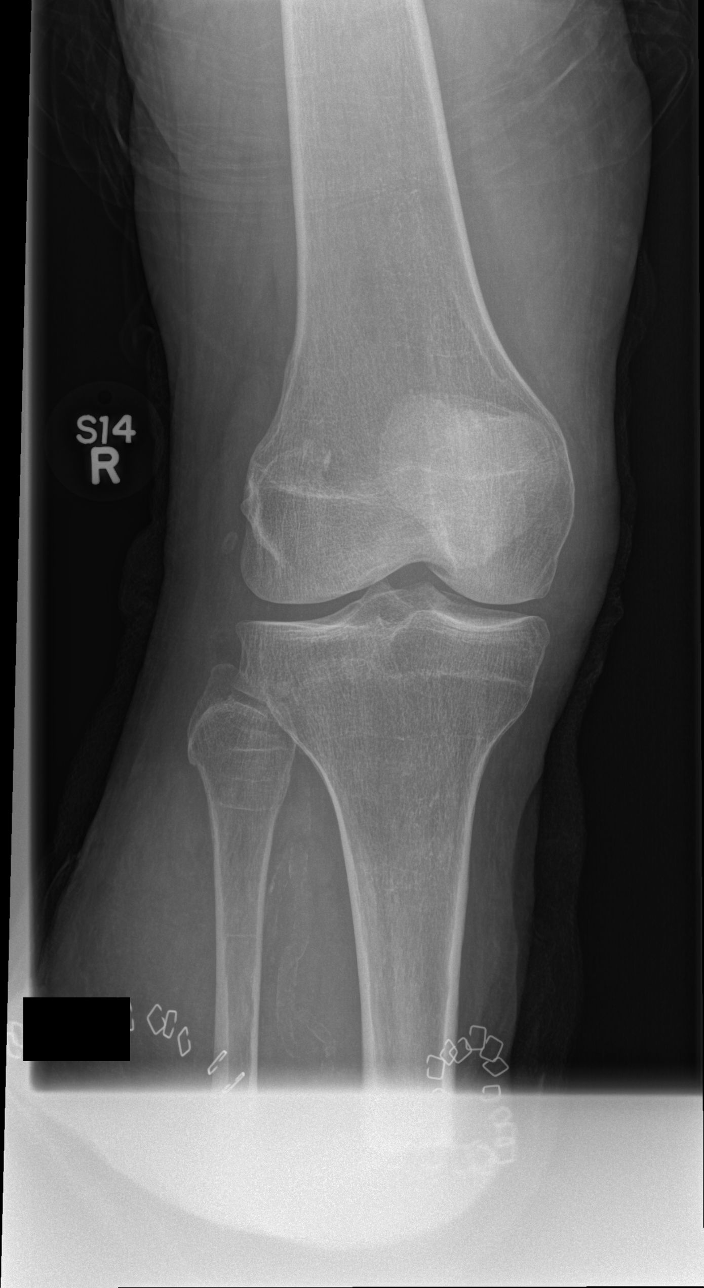

[t knee obl right (2 of 2)]
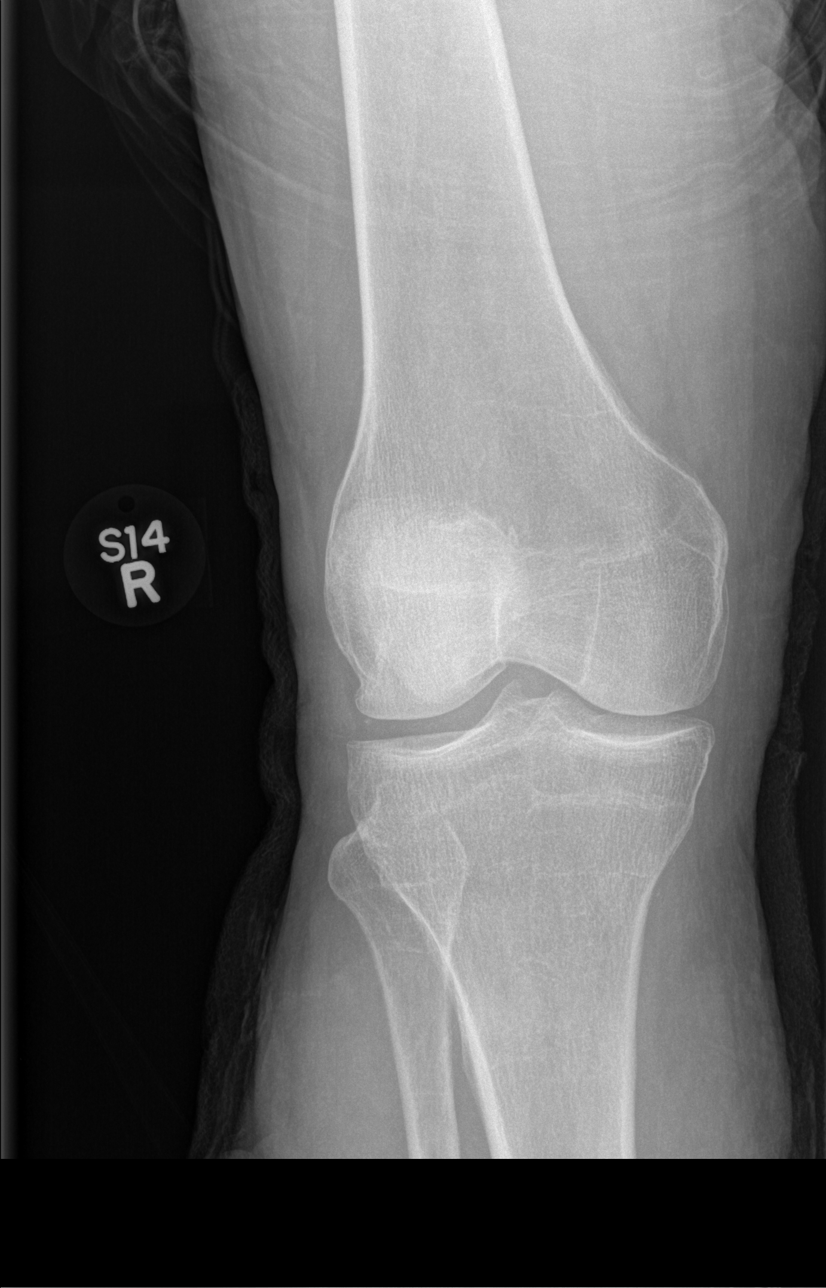

[t knee lat right]
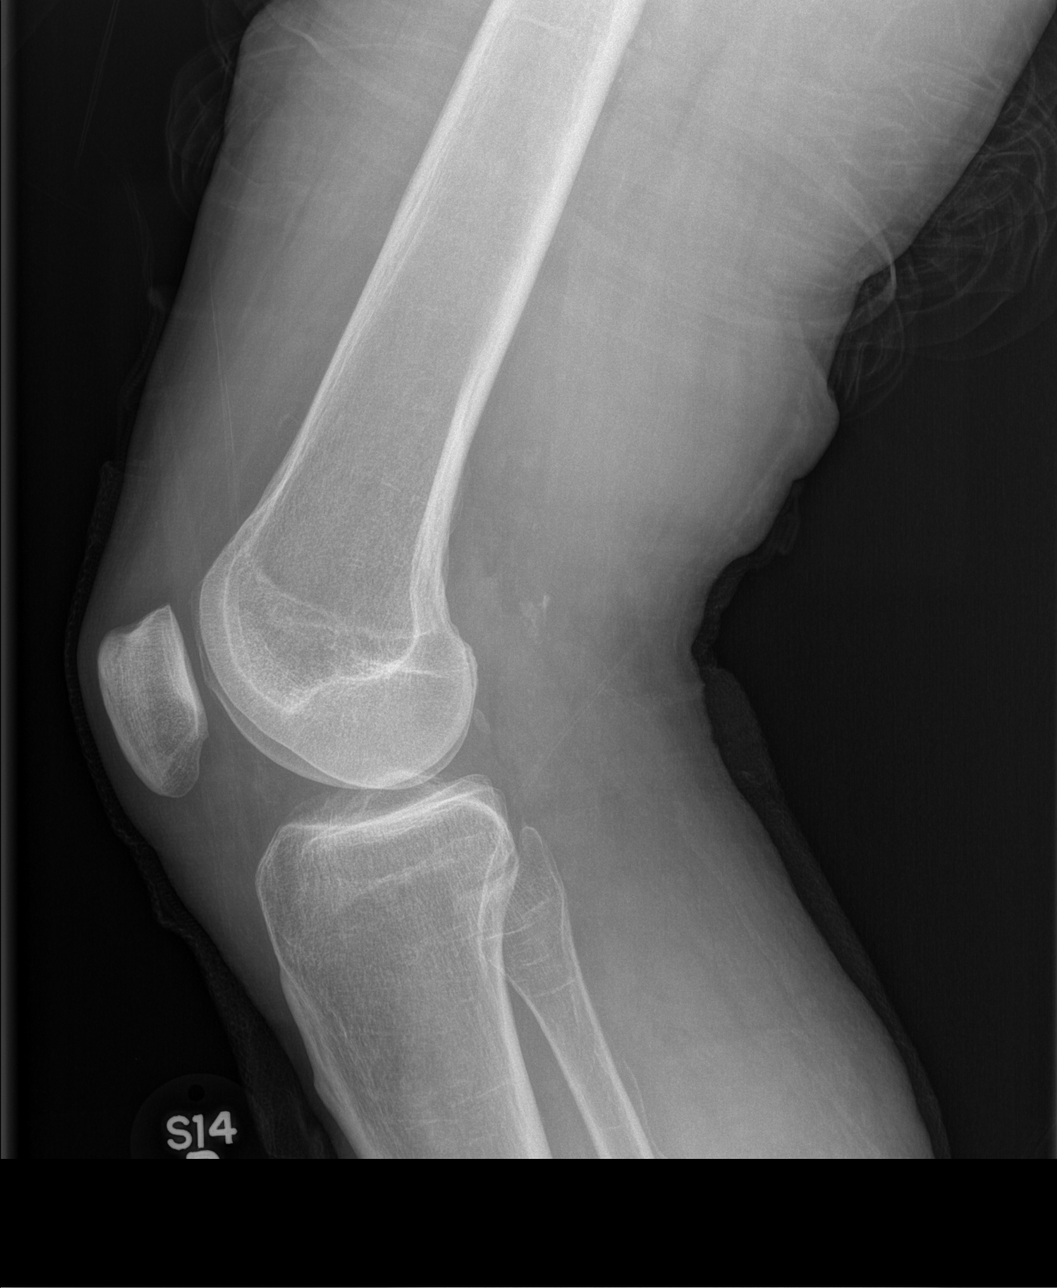

[x knee patella right]
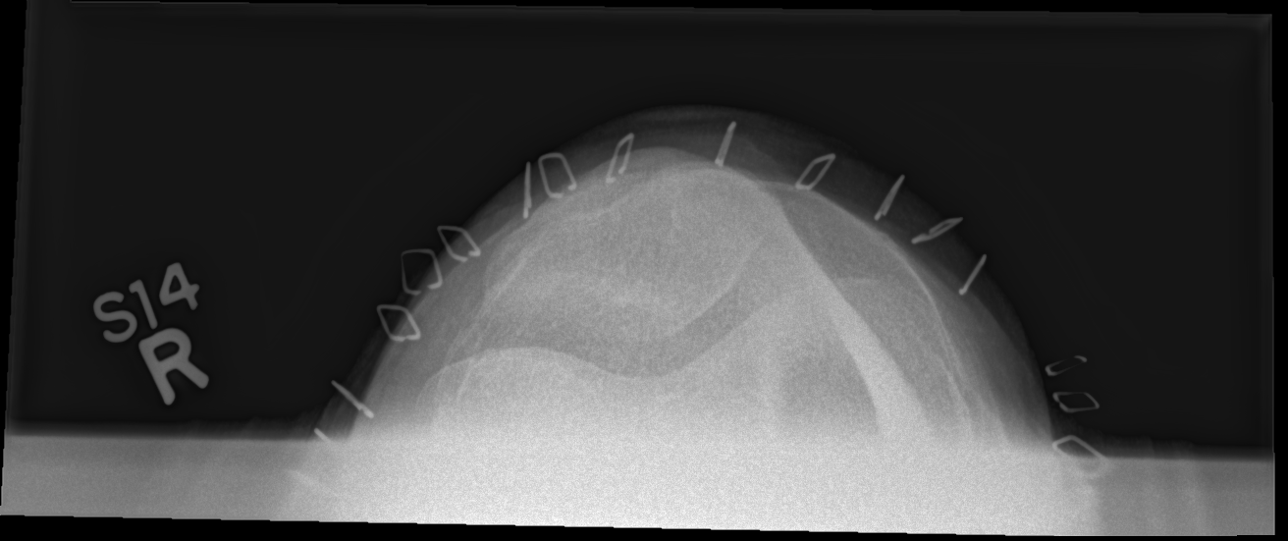

[5 of 5 positions shown; findings below may reference images not displayed]

FINDINGS: There is no evidence of fracture, dislocation, or joint effusion.

Partial visualization of below-the-knee amputation changes.

Osteopenia, likely disuse.
IMPRESSION: No acute osseous finding.

## 2017-08-27 ENCOUNTER — Other Ambulatory Visit: Payer: Self-pay | Admitting: Internal Medicine

## 2017-08-27 DIAGNOSIS — G5600 Carpal tunnel syndrome, unspecified upper limb: Principal | ICD-10-CM

## 2017-08-27 DIAGNOSIS — E1149 Type 2 diabetes mellitus with other diabetic neurological complication: Secondary | ICD-10-CM

## 2017-09-11 ENCOUNTER — Encounter: Payer: Self-pay | Admitting: *Deleted

## 2017-09-14 ENCOUNTER — Other Ambulatory Visit: Payer: Self-pay | Admitting: Endocrinology

## 2017-09-18 ENCOUNTER — Other Ambulatory Visit: Payer: Medicaid Other

## 2017-09-20 ENCOUNTER — Ambulatory Visit: Payer: Medicaid Other | Admitting: Endocrinology

## 2017-10-01 ENCOUNTER — Ambulatory Visit (INDEPENDENT_AMBULATORY_CARE_PROVIDER_SITE_OTHER): Payer: Medicare HMO | Admitting: Internal Medicine

## 2017-10-01 ENCOUNTER — Encounter (INDEPENDENT_AMBULATORY_CARE_PROVIDER_SITE_OTHER): Payer: Self-pay

## 2017-10-01 ENCOUNTER — Other Ambulatory Visit: Payer: Self-pay

## 2017-10-01 DIAGNOSIS — E119 Type 2 diabetes mellitus without complications: Secondary | ICD-10-CM

## 2017-10-01 DIAGNOSIS — L723 Sebaceous cyst: Secondary | ICD-10-CM

## 2017-10-01 DIAGNOSIS — Z872 Personal history of diseases of the skin and subcutaneous tissue: Secondary | ICD-10-CM | POA: Diagnosis not present

## 2017-10-01 NOTE — Progress Notes (Signed)
   CC: Boil on back  HPI:  [name] is a 57 y.o. male with a past medical history listed below here today with complaints of a boil on his back.  He notes that for the past couple days he has noted pain on the back.  He he then noticed a large boil.  He states that he has had these before and has had to have one on his neck drained previously.  He is unaware of how long it has been present and is only noticed in the past couple days when he began to experience pain when lying on his back or putting pressure on the area.  He denies any systemic symptoms including fever, chills, nausea, vomiting.  He otherwise reports he is in good health.  He did bring his meter with him today and his sugars have been well controlled.  Past Medical History:  Diagnosis Date  . Arthritis    bilateral hips  . Deep vein thrombosis (DVT) (HCC)   . Diabetes mellitus    type II  . 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 DJD (degenerative joint disease)   . Pulmonary emboli (HCC) 02/08/2014   Date of diagnosis February 08 2014, on chest CTA Hospitalized for 3 days Had some symptoms of shortness of breath, and chest pain With intercurrent DVT of the left LE. Duration of anticoagulation: 8 months. End date 10/11/2014.  Anticoagulant: Lovenox 120 units daily Switched to Eliquis on 05/25/2014 per patient preference   . Pulmonary embolism (HCC)   . Sebaceous cyst    on back of neck   Review of Systems:    Negative except as noted in HPI  Physical Exam:  Vitals:   10/01/17 1612  BP: 135/82  Pulse: 82  Temp: 98.1 F (36.7 C)  TempSrc: Oral  SpO2: 95%  Weight: 214 lb 14.4 oz (97.5 kg)  Height: 6' (1.829 m)   GENERAL- alert, co-operative, appears as stated age, not in any distress. CARDIAC- RRR, no murmurs, rubs or gallops. RESP- Moving equal volumes of air, and clear to auscultation bilaterally, no wheezes or crackles. ABDOMEN- Soft, nontender, bowel sounds present. EXTREMITIES- pulse 2+,  symmetric, no pedal edema. SKIN-large sebaceous cyst on left upper back.  No warmth or surrounding erythema.  Assessment & Plan:   See Encounters Tab for problem based charting.  Patient seen with Dr. Cleda DaubE. Hoffman

## 2017-10-01 NOTE — Assessment & Plan Note (Signed)
She with large sebaceous cyst on left upper back.  Also notable for smaller less inflamed cyst on right upper back as well as underneath left jawline. Discussed that it is likely to recur even with drainage today.  He wished to proceed with drainage today.  Discussed that if it did recur that Dr. Magnus IvanBlackman who removed the cyst on his neck in the past be able to provide more definitive treatment in the future.  PROCEDURE:  A timeout protocol was performed prior to initiating the procedure. The area was prepared and draped in the usual, sterile manner. The site was anesthetized with 1% lidocaine without epinephrine. A linear incision along the local skin lines was made and the purulent material expressed. The cyst was explored thoroughly and sequestered pockets were opened.  Attempted to remove the capsule however it was very friable and unable to remove it completely.  Bleeding was minimal.  Packing: None Followup: The patient tolerated the procedure well without complications. Standard post-procedure care is explained and return precautions are given.

## 2017-10-01 NOTE — Patient Instructions (Signed)
Epidermal Cyst Removal, Care After Refer to this sheet in the next few weeks. These instructions provide you with information about caring for yourself after your procedure. Your health care provider may also give you more specific instructions. Your treatment has been planned according to current medical practices, but problems sometimes occur. Call your health care provider if you have any problems or questions after your procedure. What can I expect after the procedure? After the procedure, it is common to have:  Soreness in the area where your cyst was removed.  Tightness or itching from your skin sutures.  Follow these instructions at home:  Take medicines only as directed by your health care provider.  If you were prescribed an antibiotic medicine, finish all of it even if you start to feel better.  Use antibiotic ointment as directed by your health care provider. Follow the instructions carefully.  There are many different ways to close and cover an incision, including stitches (sutures), skin glue, and adhesive strips. Follow your health care provider's instructions about: ? Incision care. ? Bandage (dressing) changes and removal. ? Incision closure removal.  Keep the bandage (dressing) dry until your health care provider says that it can be removed. Take sponge baths only. Ask your health care provider when you can start showering or taking a bath.  After your dressing is off, check your incision every day for signs of infection. Watch for: ? Redness, swelling, or pain. ? Fluid, blood, or pus.  You can return to your normal activities. Do not do anything that stretches or puts pressure on your incision.  You can return to your normal diet.  Keep all follow-up visits as directed by your health care provider. This is important. Contact a health care provider if:  You have a fever.  Your incision bleeds.  You have redness, swelling, or pain in the incision area.  You  have fluid, blood, or pus coming from your incision.  Your cyst comes back after surgery. This information is not intended to replace advice given to you by your health care provider. Make sure you discuss any questions you have with your health care provider. Document Released: 08/20/2014 Document Revised: 01/05/2016 Document Reviewed: 04/14/2014 Elsevier Interactive Patient Education  2018 Elsevier Inc.  

## 2017-10-03 NOTE — Progress Notes (Signed)
Internal Medicine Clinic Attending  I saw and evaluated the patient.  I personally confirmed the key portions of the history and exam documented by Dr. Karma GreaserBoswell and I reviewed pertinent patient test results.  The assessment, diagnosis, and plan were formulated together and I agree with the documentation in the resident's note.  I was present and assisted for the entire procedure.

## 2017-10-22 ENCOUNTER — Other Ambulatory Visit (INDEPENDENT_AMBULATORY_CARE_PROVIDER_SITE_OTHER): Payer: Medicare HMO

## 2017-10-22 DIAGNOSIS — Z794 Long term (current) use of insulin: Secondary | ICD-10-CM

## 2017-10-22 DIAGNOSIS — E1165 Type 2 diabetes mellitus with hyperglycemia: Secondary | ICD-10-CM | POA: Diagnosis not present

## 2017-10-22 LAB — LIPID PANEL
Cholesterol: 170 mg/dL (ref 0–200)
HDL: 45.1 mg/dL (ref >39.00)
LDL Cholesterol: 87 mg/dL (ref 0–99)
NonHDL: 125.14
Total CHOL/HDL Ratio: 4
Triglycerides: 193 mg/dL — ABNORMAL HIGH (ref 0.0–149.0)
VLDL: 38.6 mg/dL (ref 0.0–40.0)

## 2017-10-22 LAB — COMPREHENSIVE METABOLIC PANEL
ALT: 63 U/L — ABNORMAL HIGH (ref 0–53)
AST: 54 U/L — ABNORMAL HIGH (ref 0–37)
Albumin: 4 g/dL (ref 3.5–5.2)
Alkaline Phosphatase: 45 U/L (ref 39–117)
BUN: 14 mg/dL (ref 6–23)
CO2: 30 mEq/L (ref 19–32)
Calcium: 9.5 mg/dL (ref 8.4–10.5)
Chloride: 99 mEq/L (ref 96–112)
Creatinine, Ser: 0.83 mg/dL (ref 0.40–1.50)
GFR: 102.22 mL/min (ref >60.00)
Glucose, Bld: 145 mg/dL — ABNORMAL HIGH (ref 70–99)
Potassium: 3.9 mEq/L (ref 3.5–5.1)
Sodium: 136 mEq/L (ref 135–145)
Total Bilirubin: 0.3 mg/dL (ref 0.2–1.2)
Total Protein: 7.2 g/dL (ref 6.0–8.3)

## 2017-10-22 LAB — HEMOGLOBIN A1C: Hgb A1c MFr Bld: 6.8 % — ABNORMAL HIGH (ref 4.6–6.5)

## 2017-10-25 ENCOUNTER — Encounter: Payer: Self-pay | Admitting: Endocrinology

## 2017-10-25 ENCOUNTER — Ambulatory Visit (INDEPENDENT_AMBULATORY_CARE_PROVIDER_SITE_OTHER): Payer: Medicare HMO | Admitting: Endocrinology

## 2017-10-25 VITALS — BP 130/75 | HR 94 | Wt 213.0 lb

## 2017-10-25 DIAGNOSIS — Z794 Long term (current) use of insulin: Secondary | ICD-10-CM

## 2017-10-25 DIAGNOSIS — E1165 Type 2 diabetes mellitus with hyperglycemia: Secondary | ICD-10-CM | POA: Diagnosis not present

## 2017-10-25 NOTE — Progress Notes (Signed)
Gregory May 57 y.o.           Reason for Appointment: Diabetes follow-up   History of Present Illness   Diagnosis: Type 2 DIABETES MELITUS, date of diagnosis:  1999     Previous history: He has previously been treated with metformin and Victoza and subsequently mealtime insulin added to control postprandial hyperglycemia Overall he has been  relatively noncompliant with his diet, medications, monitoring and followup Previously would not take Victoza regularly because of cost. Has not been taking any medications for the last year and a half because of commitments to family and cost A1c had increased to 12.3% and hyperglycemia discovered when he was hospitalized for foot ulcer. Also lost 20 pounds because of hyperglycemia   For better control, compliance and inconvenience he was switched to the V-go pump on 02/21/16  Recent history:   Insulin regimen: V-go 20 unit basal, mealtime boluses 12-16 breakfast ---12-20 units dinner Non-insulin hypoglycemic drugs: Metformin ER 1500 mg daily, Bydureon 2 mg weekly          Highest A1c was 8% last year, lowest A1c has been 6.3 in 2015 ; it is now 6.8      Management, blood sugar patterns and problems identified.  He appears to have gained significant amount of weight  He says that this is from eating more especially in the evenings  He does tend to forget to check his blood sugars after meals especially in the evening  However blood sugars late afternoon and before suppertime his first meal appeared to be low normal fairly consistently including symptomatic low sugar of 69 yesterday around 6 PM  He has not been very active lately  He has a couple of readings after evening meal and 1 of them is over 200  No low sugars overnight  FASTING readings are generally mildly increased above 100  No side effects with Bydureon       Side effects from medications: None       Monitors blood glucose: Less than once a day .  Glucometer:  One Touch.          Blood Glucose readings by download:   Mean values apply above for all meters except median for One Touch  PRE-MEAL Fasting Lunch Dinner Bedtime Overall  Glucose range:  107-164   69-100    Mean/median:  125    111   POST-MEAL PC Breakfast PC Lunch PC Dinner  Glucose range: ?   166-202  Mean/median:        Meals:  usually 2 meals per day usually.  breakfast at 12 noon, evening meal 7-9 pm, variable snacks late at night    Physical activity: exercise: Minimal               Weight control:   Wt Readings from Last 3 Encounters:  10/25/17 213 lb (96.6 kg)  10/01/17 214 lb 14.4 oz (97.5 kg)  06/25/17 203 lb (92.1 kg)          Diabetes labs:  Lab Results  Component Value Date   HGBA1C 6.8 (H) 10/22/2017   HGBA1C 6.5 06/13/2017   HGBA1C 6.8 03/18/2017   Lab Results  Component Value Date   MICROALBUR 0.9 03/07/2017   LDLCALC 87 10/22/2017   CREATININE 0.83 10/22/2017    Lab Results  Component Value Date   FRUCTOSAMINE 243 03/07/2017   FRUCTOSAMINE 274 10/30/2016   FRUCTOSAMINE 260 04/13/2016     Other active problems: See review of  systems    Lab on 10/22/2017  Component Date Value Ref Range Status  . Cholesterol 10/22/2017 170  0 - 200 mg/dL Final   ATP III Classification       Desirable:  < 200 mg/dL               Borderline High:  200 - 239 mg/dL          High:  > = 240 mg/dL  . Triglycerides 10/22/2017 193.0* 0.0 - 149.0 mg/dL Final   Normal:  <150 mg/dLBorderline High:  150 - 199 mg/dL  . HDL 10/22/2017 45.10  >39.00 mg/dL Final  . VLDL 10/22/2017 38.6  0.0 - 40.0 mg/dL Final  . LDL Cholesterol 10/22/2017 87  0 - 99 mg/dL Final  . Total CHOL/HDL Ratio 10/22/2017 4   Final                  Men          Women1/2 Average Risk     3.4          3.3Average Risk          5.0          4.42X Average Risk          9.6          7.13X Average Risk          15.0          11.0                      . NonHDL 10/22/2017 125.14   Final   NOTE:  Non-HDL  goal should be 30 mg/dL higher than patient's LDL goal (i.e. LDL goal of < 70 mg/dL, would have non-HDL goal of < 100 mg/dL)  . Sodium 10/22/2017 136  135 - 145 mEq/L Final  . Potassium 10/22/2017 3.9  3.5 - 5.1 mEq/L Final  . Chloride 10/22/2017 99  96 - 112 mEq/L Final  . CO2 10/22/2017 30  19 - 32 mEq/L Final  . Glucose, Bld 10/22/2017 145* 70 - 99 mg/dL Final  . BUN 10/22/2017 14  6 - 23 mg/dL Final  . Creatinine, Ser 10/22/2017 0.83  0.40 - 1.50 mg/dL Final  . Total Bilirubin 10/22/2017 0.3  0.2 - 1.2 mg/dL Final  . Alkaline Phosphatase 10/22/2017 45  39 - 117 U/L Final  . AST 10/22/2017 54* 0 - 37 U/L Final  . ALT 10/22/2017 63* 0 - 53 U/L Final  . Total Protein 10/22/2017 7.2  6.0 - 8.3 g/dL Final  . Albumin 10/22/2017 4.0  3.5 - 5.2 g/dL Final  . Calcium 10/22/2017 9.5  8.4 - 10.5 mg/dL Final  . GFR 10/22/2017 102.22  >60.00 mL/min Final  . Hgb A1c MFr Bld 10/22/2017 6.8* 4.6 - 6.5 % Final   Glycemic Control Guidelines for People with Diabetes:Non Diabetic:  <6%Goal of Therapy: <7%Additional Action Suggested:  >8%      Allergies as of 10/25/2017   No Known Allergies     Medication List        Accurate as of 10/25/17  4:51 PM. Always use your most recent med list.          acetaminophen 325 MG tablet Commonly known as:  TYLENOL Take 650 mg by mouth every 6 (six) hours as needed.   BYDUREON 2 MG Pen Generic drug:  Exenatide ER INJECT 1 SYRINGE SUBCUTANEOUSLY ONCE A WEEK   cholecalciferol 1000 units  tablet Commonly known as:  VITAMIN D Take 1,000 Units by mouth daily.   gabapentin 300 MG capsule Commonly known as:  NEURONTIN TAKE 1 CAPSULE BY MOUTH THREE TIMES DAILY   insulin aspart 100 UNIT/ML injection Commonly known as:  NOVOLOG USE 56 UNITS DAILY IN V-GO PUMP   metFORMIN 750 MG 24 hr tablet Commonly known as:  GLUCOPHAGE-XR TAKE 2 TABLETS BY MOUTH ONCE DAILY   pravastatin 40 MG tablet Commonly known as:  PRAVACHOL Take 1 tablet (40 mg total) by mouth  daily.   V-GO 20 Kit by Does not apply route.       Allergies: No Known Allergies  Past Medical History:  Diagnosis Date  . Arthritis    bilateral hips  . Deep vein thrombosis (DVT) ([place])   . Diabetes mellitus    type II  . 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 DJD (degenerative joint disease)   . Pulmonary emboli ([place]) 02/08/2014   Date of diagnosis February 08 2014, on chest CTA Hospitalized for 3 days Had some symptoms of shortness of breath, and chest pain With intercurrent DVT of the left LE. Duration of anticoagulation: 8 months. End date 10/11/2014.  Anticoagulant: Lovenox 120 units daily Switched to Eliquis on 05/25/2014 per patient preference   . Pulmonary embolism ([place])   . Sebaceous cyst    on back of neck    Past Surgical History:  Procedure Laterality Date  . AMPUTATION Right 06/03/2015   Procedure: Right Below Knee Amputation;  Surgeon: [name], MD;  Location: [facility];  Service: Orthopedics;  Laterality: Right;  . CLOSED REDUCTION WITH HUMER PIN INSERTION  1974   left hip  . HARDWARE REMOVAL Left 07/21/2014   Procedure: HARDWARE REMOVAL;  Surgeon: Ninetta Lights, MD;  Location: [facility];  Service: Orthopedics;  Laterality: Left;  . JOINT REPLACEMENT  2006 (approx)   right hip replaced  . ROTATOR CUFF REPAIR  2005 (approx)   right   . 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 TOTAL HIP ARTHROPLASTY Left 07/21/2014   Procedure: TOTAL HIP ARTHROPLASTY ANTERIOR APPROACH;  Surgeon: Ninetta Lights, MD;  Location: [facility];  Service: Orthopedics;  Laterality: Left;    Family History  Problem Relation Age of Onset  . Cancer Mother        breast, colon, liver  . Diabetes Father     Social History:  reports that  has never smoked. he has never used smokeless tobacco. He reports that he drinks alcohol. He reports that he does not use drugs.  Review of Systems:   Lipids: He is on Pravachol  with good control of LDL as before This is also  followed by PCP   Lab Results  Component Value Date   CHOL 170 10/22/2017   HDL 45.10 10/22/2017   LDLCALC 87 10/22/2017   LDLDIRECT 91.0 07/11/2015   TRIG 193.0 (H) 10/22/2017   CHOLHDL 4 10/22/2017    No history of hypertension  He was overdue for eye exam and reminded him to schedule this   Examination:   BP 130/75   Pulse 94   Wt 213 lb (96.6 kg)   SpO2 96%   BMI 28.89 kg/m   Body mass index is 28.89 kg/m.   ASSESSMENT/ PLAN:     Diabetes type 2:  See history of present illness for detailed discussion of current diabetes management, blood sugar patterns and problems identified  Blood  sugars have been overall well controlled A1c is still below 7 However he is gaining weight Mostly is sugars are low normal in the afternoon after his first meal and before dinner but also periodically higher after evening He does need to have better idea what his sugars are doing after his evening meal forgets to check them Since his fasting readings are generally fairly good he can continue the 20 units basal V-go pump   Recommendations: He will need to start checking sugars after his evening meals more consistently to help adjust his mealtime boluses Also may have been be more proactive with watching his diet and snacks He will reduce his boluses at his first meal by at least one click on his pump and adjust based on his sugars in the afternoon Cut back on meal portions and snacks He will call his eye doctor for an exam  Patient Instructions  More testing after MEALS     Elayne Snare 10/25/2017, 4:51 PM   Note: This office note was prepared with Dragon voice recognition system technology. Any transcriptional errors that result from this process are unintentional.

## 2017-10-25 NOTE — Patient Instructions (Signed)
More testing after MEALS

## 2017-11-07 ENCOUNTER — Other Ambulatory Visit: Payer: Self-pay | Admitting: Endocrinology

## 2017-11-08 ENCOUNTER — Telehealth: Payer: Self-pay | Admitting: Endocrinology

## 2017-11-08 ENCOUNTER — Other Ambulatory Visit: Payer: Self-pay

## 2017-11-08 MED ORDER — EXENATIDE ER 2 MG ~~LOC~~ PEN: PEN_INJECTOR | SUBCUTANEOUS | 4 refills | Status: DC

## 2017-11-08 NOTE — Telephone Encounter (Signed)
Patient [facility] [address] - 1 x weekly (Pharmacy sent request but told patient to call us also)

## 2017-12-13 ENCOUNTER — Other Ambulatory Visit: Payer: Self-pay | Admitting: Endocrinology

## 2017-12-16 ENCOUNTER — Other Ambulatory Visit: Payer: Self-pay | Admitting: Internal Medicine

## 2017-12-16 DIAGNOSIS — E1149 Type 2 diabetes mellitus with other diabetic neurological complication: Secondary | ICD-10-CM

## 2017-12-16 DIAGNOSIS — G5600 Carpal tunnel syndrome, unspecified upper limb: Principal | ICD-10-CM

## 2017-12-16 NOTE — Telephone Encounter (Signed)
Patient is requesting a refill on gabapentin,

## 2017-12-18 ENCOUNTER — Other Ambulatory Visit: Payer: Self-pay | Admitting: Internal Medicine

## 2017-12-18 NOTE — Telephone Encounter (Signed)
Have spoken to dr Crista Elliot and pt, pt is scheduled in [facility] 5/9 at 1515

## 2017-12-18 NOTE — Telephone Encounter (Signed)
Patient will need to be seen in clinic prior to a refill of the gabapentin as he has not been evaluated for this since his appointment in August of last year where he was advised to follow-up in November. He was unable to attend those appointments and was provided with a gabapentin refill in January but will need to be seen prior to any additional refills to determine appropriateness of he medication and dosage.   Thank you,  Lanelle Bal, MD Internal Medicine

## 2017-12-18 NOTE — Telephone Encounter (Signed)
Have scheduled in [facility] 5/9 pm

## 2017-12-18 NOTE — Telephone Encounter (Signed)
Patient is requesting refills on gabapentin until appt on 05/17, pls call patient

## 2017-12-19 ENCOUNTER — Ambulatory Visit (INDEPENDENT_AMBULATORY_CARE_PROVIDER_SITE_OTHER): Payer: Medicare HMO | Admitting: Internal Medicine

## 2017-12-19 ENCOUNTER — Encounter: Payer: Self-pay | Admitting: Internal Medicine

## 2017-12-19 ENCOUNTER — Other Ambulatory Visit: Payer: Self-pay

## 2017-12-19 ENCOUNTER — Ambulatory Visit: Payer: Medicare HMO

## 2017-12-19 DIAGNOSIS — E1149 Type 2 diabetes mellitus with other diabetic neurological complication: Secondary | ICD-10-CM

## 2017-12-19 DIAGNOSIS — Z79899 Other long term (current) drug therapy: Secondary | ICD-10-CM

## 2017-12-19 DIAGNOSIS — G609 Hereditary and idiopathic neuropathy, unspecified: Secondary | ICD-10-CM

## 2017-12-19 DIAGNOSIS — G5600 Carpal tunnel syndrome, unspecified upper limb: Principal | ICD-10-CM

## 2017-12-19 DIAGNOSIS — L723 Sebaceous cyst: Secondary | ICD-10-CM | POA: Diagnosis not present

## 2017-12-19 DIAGNOSIS — G629 Polyneuropathy, unspecified: Secondary | ICD-10-CM | POA: Insufficient documentation

## 2017-12-19 DIAGNOSIS — Z8739 Personal history of other diseases of the musculoskeletal system and connective tissue: Secondary | ICD-10-CM

## 2017-12-19 DIAGNOSIS — E1142 Type 2 diabetes mellitus with diabetic polyneuropathy: Secondary | ICD-10-CM | POA: Diagnosis not present

## 2017-12-19 DIAGNOSIS — Z89611 Acquired absence of right leg above knee: Secondary | ICD-10-CM

## 2017-12-19 MED ORDER — GABAPENTIN 300 MG PO CAPS: 300.0000 mg | ORAL_CAPSULE | Freq: Three times a day (TID) | ORAL | 3 refills | Status: DC

## 2017-12-19 NOTE — Patient Instructions (Addendum)
FOLLOW-UP INSTRUCTIONS When: Six months, please stop and schedule or call and schedule For: Routine What to bring: All of your medications  We will order a nerve conduction test to evaluate the nerve pain.  I have refilled your gabapentin today.  Thank you for your visit to the [facility].  If you have any questions please feel free to call us.

## 2017-12-19 NOTE — Progress Notes (Signed)
   CC: hand pain  HPI:Gregory May is a 57 y.o. male who presents today for evaluation of his year long history of worsening hand pain bilaterally.   Peripheral neuropathy: Bilateral upper extremities worse in the right, does not resemble his prior carpal tunnel syndrome. 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 I suspect that there is a significant degree of diabetic neuropathy resulting in decreased sensation given the severity of the foot ulcer without his awareness. The etiology however, remains unclear at this time. He follows with endocrinology for his diabetes.  He denied fever, chills, muscle aches, nausea, vomiting, diarrhea, or constipation.   Plan:  Medication refill of gabapentin  TID Nerve conduction study ordered Will call and schedule sooner than six months if nerve conduction studies indicate  Sebaceous cyst: Site appears well healed. Second cyst of little concern to the patient. He would like to defer removal for now.    Past Medical History:  Diagnosis Date  . Arthritis    bilateral hips  . Deep vein thrombosis (DVT) (HCC)   . Diabetes mellitus    type II  . 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 DJD (degenerative joint disease)   . Pulmonary emboli (HCC) 02/08/2014   Date of diagnosis February 08 2014, on chest CTA Hospitalized for 3 days Had some symptoms of shortness of breath, and chest pain With intercurrent DVT of the left LE. Duration of anticoagulation: 8 months. End date 10/11/2014.  Anticoagulant: Lovenox 120 units daily Switched to Eliquis on 05/25/2014 per patient preference   . Pulmonary embolism (HCC)   . Sebaceous cyst    on back of neck   Review of Systems:  ROS negative except as per HPI.  Physical Exam:  Vitals:   12/19/17 1530    BP: (!) 142/84  Pulse: 78  Temp: 98.4 F (36.9 C)  TempSrc: Oral  SpO2: 98%  Weight: 213 lb 1.6 oz (96.7 kg)  Height: 6' (1.829 m)   Physical Exam  Constitutional: He appears well-developed and well-nourished. No distress.  Cardiovascular: Normal rate and regular rhythm.  No murmur heard. Pulmonary/Chest: Effort normal and breath sounds normal. No stridor. No respiratory distress.  Abdominal: Soft. Bowel sounds are normal. He exhibits no distension.  Musculoskeletal: He exhibits deformity (Small 1.5cm sebacious cyst on the right upper quadrant overlying the scapula ).  Neurological: He is alert.  Skin: Skin is warm. He is not diaphoretic.  Psychiatric: He has a normal mood and affect.   Assessment & Plan:   See Encounters Tab for problem based charting.  Patient discussed with Dr. Heide Spark

## 2017-12-20 ENCOUNTER — Encounter: Payer: Self-pay | Admitting: Internal Medicine

## 2017-12-20 NOTE — Assessment & Plan Note (Signed)
See peripheral neuropathy.

## 2017-12-20 NOTE — Assessment & Plan Note (Signed)
Sebaceous cyst: Site appears well healed. Second cyst of little concern to the patient. He would like to defer removal for now.

## 2017-12-20 NOTE — Progress Notes (Signed)
Internal Medicine Clinic Attending  Case discussed with Dr. Harbrecht at the time of the visit.  We reviewed the resident's history and exam and pertinent patient test results.  I agree with the assessment, diagnosis, and plan of care documented in the resident's note.   

## 2017-12-20 NOTE — Assessment & Plan Note (Signed)
  Peripheral neuropathy: Bilateral upper extremities worse in the right, does not resemble his prior carpal tunnel syndrome. 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 I suspect that there is a significant degree of diabetic neuropathy resulting in decreased sensation given the severity of the foot ulcer without his awareness. The etiology however, remains unclear at this time. He follows with endocrinology for his diabetes.  He denied fever, chills, muscle aches, nausea, vomiting, diarrhea, or constipation.   Plan:  Medication refill of gabapentin  TID Nerve conduction study ordered Will call and schedule sooner than six months if nerve conduction studies indicate

## 2017-12-27 ENCOUNTER — Ambulatory Visit: Payer: Medicare HMO

## 2018-01-02 ENCOUNTER — Other Ambulatory Visit: Payer: Self-pay

## 2018-01-02 ENCOUNTER — Telehealth: Payer: Self-pay | Admitting: Endocrinology

## 2018-01-02 MED ORDER — V-GO 20 KIT: 1.0000 | PACK | Freq: Every day | 3 refills | Status: DC

## 2018-01-02 NOTE — Telephone Encounter (Signed)
Please have him start either [place]o or [place] whichever is covered by his insurance 24 units once a day along with NovoLog 16 units at breakfast and dinner, send prescription as needed.  Also need to make an appointment for follow-up for  4 to 6 weeks with labs

## 2018-01-02 NOTE — Telephone Encounter (Signed)
Patient has some question about his Vgo 20 insulin pump. He is stating that he has some concerns about getting his insulin and does not want to mess up his a1c.   Please advise

## 2018-01-02 NOTE — Telephone Encounter (Signed)
This medication was sent to [facility] pharmacy.

## 2018-01-02 NOTE — Telephone Encounter (Signed)
Attempted to call patient and left a message for him to call back.

## 2018-01-02 NOTE — Telephone Encounter (Signed)
Patient called back about the Vgo pump. He stated he has the [facility] number for this to be sent in  [phone] or 31 Patient could not read what he had written,

## 2018-01-02 NOTE — Telephone Encounter (Signed)
Patient states that his insurance informed him that they would no longer be paying for the Vgo20 pump. Patient has been wearing the pump off and on and giving himself shots when he is not wearing the Vgo pump. Patient is now concerned about his blood sugars because they are rising. He just wanted Dr. Lucianne Muss to be aware.

## 2018-01-07 ENCOUNTER — Telehealth: Payer: Self-pay | Admitting: Endocrinology

## 2018-01-07 ENCOUNTER — Other Ambulatory Visit: Payer: Self-pay

## 2018-01-07 MED ORDER — V-GO 20 KIT: 1.0000 | PACK | Freq: Every day | 3 refills | Status: DC

## 2018-01-07 NOTE — Telephone Encounter (Signed)
Patient states that he called his insurance and they told him that the insurance will pay for the vgo pump if he has it sent through [facility] mail order pharmacy.

## 2018-01-07 NOTE — Telephone Encounter (Signed)
Patient is calling on the status of medication change, please advise

## 2018-01-07 NOTE — Telephone Encounter (Signed)
Called and left message for patient to call back.

## 2018-01-07 NOTE — Telephone Encounter (Signed)
LVM for patient to call me back to discuss note below- I will request he call his insurance company to see which is covered first before we proceed with ordering new prescription

## 2018-01-08 ENCOUNTER — Telehealth: Payer: Self-pay | Admitting: Internal Medicine

## 2018-01-08 DIAGNOSIS — G609 Hereditary and idiopathic neuropathy, unspecified: Secondary | ICD-10-CM

## 2018-01-08 NOTE — Telephone Encounter (Signed)
Have asked Referral Coordinator to look into scheduling of Nerve Conduction Test. Kinnie Feil, RN, BSN

## 2018-01-08 NOTE — Telephone Encounter (Signed)
Is he referring to the nerve conduction study? Was it completed?

## 2018-01-08 NOTE — Telephone Encounter (Signed)
Patient said that physician is suppose to call him regarding his nerves

## 2018-01-09 ENCOUNTER — Ambulatory Visit: Payer: Medicare HMO | Admitting: Endocrinology

## 2018-01-09 ENCOUNTER — Other Ambulatory Visit (INDEPENDENT_AMBULATORY_CARE_PROVIDER_SITE_OTHER): Payer: Medicare HMO

## 2018-01-09 DIAGNOSIS — Z794 Long term (current) use of insulin: Secondary | ICD-10-CM

## 2018-01-09 DIAGNOSIS — E1165 Type 2 diabetes mellitus with hyperglycemia: Secondary | ICD-10-CM | POA: Diagnosis not present

## 2018-01-09 LAB — COMPREHENSIVE METABOLIC PANEL
ALT: 58 U/L — ABNORMAL HIGH (ref 0–53)
AST: 53 U/L — ABNORMAL HIGH (ref 0–37)
Albumin: 4 g/dL (ref 3.5–5.2)
Alkaline Phosphatase: 41 U/L (ref 39–117)
BUN: 10 mg/dL (ref 6–23)
CO2: 26 mEq/L (ref 19–32)
Calcium: 9.2 mg/dL (ref 8.4–10.5)
Chloride: 101 mEq/L (ref 96–112)
Creatinine, Ser: 0.95 mg/dL (ref 0.40–1.50)
GFR: 87.4 mL/min (ref >60.00)
Glucose, Bld: 201 mg/dL — ABNORMAL HIGH (ref 70–99)
Potassium: 4.3 mEq/L (ref 3.5–5.1)
Sodium: 135 mEq/L (ref 135–145)
Total Bilirubin: 0.4 mg/dL (ref 0.2–1.2)
Total Protein: 7.3 g/dL (ref 6.0–8.3)

## 2018-01-09 LAB — MICROALBUMIN / CREATININE URINE RATIO
Creatinine,U: 87.3 mg/dL
Microalb Creat Ratio: 1.7 mg/g (ref 0.0–30.0)
Microalb, Ur: 1.5 mg/dL (ref 0.0–1.9)

## 2018-01-09 LAB — HEMOGLOBIN A1C: Hgb A1c MFr Bld: 6.5 % (ref 4.6–6.5)

## 2018-01-09 NOTE — Telephone Encounter (Signed)
Spoke with the Ref Coordinator "Nikki" @ [facility].  This has not been scheduled because it needs to be placed as a Referral.  Please reorder as a Referral and they will call the pt and get him schedule as soon as possible.  LMOM with the patient that we are working on getting this order corrected and scheduled.

## 2018-01-13 ENCOUNTER — Encounter: Payer: Self-pay | Admitting: Endocrinology

## 2018-01-13 ENCOUNTER — Ambulatory Visit (INDEPENDENT_AMBULATORY_CARE_PROVIDER_SITE_OTHER): Payer: Medicare HMO | Admitting: Endocrinology

## 2018-01-13 ENCOUNTER — Other Ambulatory Visit: Payer: Self-pay

## 2018-01-13 VITALS — BP 140/80 | HR 86 | Ht 72.0 in | Wt 212.4 lb

## 2018-01-13 DIAGNOSIS — E1165 Type 2 diabetes mellitus with hyperglycemia: Secondary | ICD-10-CM

## 2018-01-13 DIAGNOSIS — Z794 Long term (current) use of insulin: Secondary | ICD-10-CM | POA: Diagnosis not present

## 2018-01-13 MED ORDER — INSULIN GLARGINE 100 UNIT/ML ~~LOC~~ SOLN: 24.0000 [IU] | Freq: Every day | SUBCUTANEOUS | 11 refills | Status: DC

## 2018-01-13 NOTE — Patient Instructions (Addendum)
call eye doctor for an exam  Check blood sugars on waking up  3/7  Also check blood sugars about 2 hours after a meal and do this after different meals by rotation  Recommended blood sugar levels on waking up is 90-130 and about 2 hours after meal is 130-160  Please bring your blood sugar monitor to each visit, thank you

## 2018-01-13 NOTE — Progress Notes (Signed)
Gregory May 57 y.o.           Reason for Appointment: Diabetes follow-up   History of Present Illness   Diagnosis: Type 2 DIABETES MELITUS, date of diagnosis:  1999     Previous history: He has previously been treated with metformin and Victoza and subsequently mealtime insulin added to control postprandial hyperglycemia Overall he has been  relatively noncompliant with his diet, medications, monitoring and followup Previously would not take Victoza regularly because of cost. Has not been taking any medications for the last year and a half because of commitments to family and cost A1c had increased to 12.3% and hyperglycemia discovered when he was hospitalized for foot ulcer. Also lost 20 pounds because of hyperglycemia   For better control, compliance and inconvenience he was switched to the V-go pump on 02/21/16  Recent history:   Insulin regimen: V-go 20 unit basal, mealtime boluses 12-16 breakfast ---12-20 units dinner Non-insulin hypoglycemic drugs: Metformin ER 1500 mg daily, Bydureon 2 mg weekly          Highest A1c was 8% last year, lowest A1c has been 6.3 in 2015 ; it is now 6.5     Management, blood sugar patterns and problems identified.  He has not brought his monitor for download today  Although he was advised to work on his weight loss he still has not been able to lose any weight  Diet can be better, tends to have relatively high fat foods at times at breakfast, lab glucose was 201 soon after a fast food breakfast  He still thinks that periodically he may have higher readings after supper but not above 180 now based on his diet  Also his fasting readings are reportedly fairly good  No symptoms of hypoglycemia in the afternoon when he may rarely have a low normal reading  He is able to do his Bydureon weekly and has no side effect  However he is not sure if he is going to get insurance coverage for his V-go pump again       Side effects from  medications: None       Monitors blood glucose: Less than once a day .  Glucometer: One Touch.          Blood Glucose readings by recall:   Mean values apply above for all meters except median for One Touch  PRE-MEAL Fasting Lunch Dinner Bedtime Overall  Glucose range: 82-147  70+    Mean/median:        POST-MEAL PC Breakfast PC Lunch PC Dinner  Glucose range:   120s  Mean/median:      Previous readings:  Mean values apply above for all meters except median for One Touch  PRE-MEAL Fasting Lunch Dinner Bedtime Overall  Glucose range:  107-164   69-100    Mean/median:  125    111   POST-MEAL PC Breakfast PC Lunch PC Dinner  Glucose range: ?   166-202  Mean/median:        Meals:  usually 2 meals per day usually.  breakfast at 12 noon, evening meal 7-9 pm, variable snacks late at night    Physical activity: exercise: Minimal               Weight control:   Wt Readings from Last 3 Encounters:  01/13/18 212 lb 6.4 oz (96.3 kg)  12/19/17 213 lb 1.6 oz (96.7 kg)  10/25/17 213 lb (96.6 kg)  Diabetes labs:  Lab Results  Component Value Date   HGBA1C 6.5 01/09/2018   HGBA1C 6.8 (H) 10/22/2017   HGBA1C 6.5 06/13/2017   Lab Results  Component Value Date   MICROALBUR 1.5 01/09/2018   LDLCALC 87 10/22/2017   CREATININE 0.95 01/09/2018    Lab Results  Component Value Date   FRUCTOSAMINE 243 03/07/2017   FRUCTOSAMINE 274 10/30/2016   FRUCTOSAMINE 260 04/13/2016     Other active problems: See review of systems    Lab on 01/09/2018  Component Date Value Ref Range Status  . Microalb, Ur 01/09/2018 1.5  0.0 - 1.9 mg/dL Final  . Creatinine,U 01/09/2018 87.3  mg/dL Final  . Microalb Creat Ratio 01/09/2018 1.7  0.0 - 30.0 mg/g Final  . Sodium 01/09/2018 135  135 - 145 mEq/L Final  . Potassium 01/09/2018 4.3  3.5 - 5.1 mEq/L Final  . Chloride 01/09/2018 101  96 - 112 mEq/L Final  . CO2 01/09/2018 26  19 - 32 mEq/L Final  . Glucose, Bld 01/09/2018 201* 70 -  99 mg/dL Final  . BUN 01/09/2018 10  6 - 23 mg/dL Final  . Creatinine, Ser 01/09/2018 0.95  0.40 - 1.50 mg/dL Final  . Total Bilirubin 01/09/2018 0.4  0.2 - 1.2 mg/dL Final  . Alkaline Phosphatase 01/09/2018 41  39 - 117 U/L Final  . AST 01/09/2018 53* 0 - 37 U/L Final  . ALT 01/09/2018 58* 0 - 53 U/L Final  . Total Protein 01/09/2018 7.3  6.0 - 8.3 g/dL Final  . Albumin 01/09/2018 4.0  3.5 - 5.2 g/dL Final  . Calcium 01/09/2018 9.2  8.4 - 10.5 mg/dL Final  . GFR 01/09/2018 87.40  >60.00 mL/min Final  . Hgb A1c MFr Bld 01/09/2018 6.5  4.6 - 6.5 % Final   Glycemic Control Guidelines for People with Diabetes:Non Diabetic:  <6%Goal of Therapy: <7%Additional Action Suggested:  >8%      Allergies as of 01/13/2018   No Known Allergies     Medication List        Accurate as of 01/13/18 11:59 PM. Always use your most recent med list.          acetaminophen 325 MG tablet Commonly known as:  TYLENOL Take 650 mg by mouth every 6 (six) hours as needed.   cholecalciferol 1000 units tablet Commonly known as:  VITAMIN D Take 1,000 Units by mouth daily.   Exenatide ER 2 MG Pen Commonly known as:  BYDUREON INJECT 1  SUBCUTANEOUSLY ONCE A WEEK   gabapentin 300 MG capsule Commonly known as:  NEURONTIN Take 1 capsule (300 mg total) by mouth 3 (three) times daily.   insulin aspart 100 UNIT/ML injection Commonly known as:  NOVOLOG USE 56 UNITS DAILY IN V-GO PUMP   insulin glargine 100 UNIT/ML injection Commonly known as:  LANTUS Inject 0.24 mLs (24 Units total) into the skin at bedtime.   metFORMIN 750 MG 24 hr tablet Commonly known as:  GLUCOPHAGE-XR TAKE 2 TABLETS BY MOUTH ONCE DAILY   pravastatin 40 MG tablet Commonly known as:  PRAVACHOL Take 1 tablet (40 mg total) by mouth daily.   V-GO 20 Kit 1 each by Does not apply route daily. Apply Vgo20 pump to arm once daily.       Allergies: No Known Allergies  Past Medical History:  Diagnosis Date  . Arthritis    bilateral  hips  . Deep vein thrombosis (DVT) ([place])   . Diabetes mellitus  type II  . 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 DJD (degenerative joint disease)   . Pulmonary emboli ([place]) 02/08/2014   Date of diagnosis February 08 2014, on chest CTA Hospitalized for 3 days Had some symptoms of shortness of breath, and chest pain With intercurrent DVT of the left LE. Duration of anticoagulation: 8 months. End date 10/11/2014.  Anticoagulant: Lovenox 120 units daily Switched to Eliquis on 05/25/2014 per patient preference   . Pulmonary embolism ([place])   . Sebaceous cyst    on back of neck    Past Surgical History:  Procedure Laterality Date  . AMPUTATION Right 06/03/2015   Procedure: Right Below Knee Amputation;  Surgeon: [name], MD;  Location: [place];  Service: Orthopedics;  Laterality: Right;  . CLOSED REDUCTION WITH HUMER PIN INSERTION  1974   left hip  . HARDWARE REMOVAL Left 07/21/2014   Procedure: HARDWARE REMOVAL;  Surgeon: Ninetta Lights, MD;  Location: [place];  Service: Orthopedics;  Laterality: Left;  . JOINT REPLACEMENT  2006 (approx)   right hip replaced  . ROTATOR CUFF REPAIR  2005 (approx)   right   . 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 TOTAL HIP ARTHROPLASTY Left 07/21/2014   Procedure: TOTAL HIP ARTHROPLASTY ANTERIOR APPROACH;  Surgeon: Ninetta Lights, MD;  Location: [place];  Service: Orthopedics;  Laterality: Left;    Family History  Problem Relation Age of Onset  . Cancer Mother        breast, colon, liver  . Diabetes Father     Social History:  reports that he has never smoked. He has never used smokeless tobacco. He reports that he drinks alcohol. He reports that he does not use drugs.  Review of Systems:   Lipids: He is on Pravachol  with good control of LDL  This is also followed by PCP   Lab Results  Component Value Date   CHOL 170 10/22/2017   HDL 45.10 10/22/2017   LDLCALC 87 10/22/2017   LDLDIRECT 91.0 07/11/2015   TRIG 193.0  (H) 10/22/2017   CHOLHDL 4 10/22/2017    No history of hypertension, recent blood pressure upper normal  BP Readings from Last 3 Encounters:  01/13/18 140/80  12/19/17 (!) 142/84  10/25/17 130/75     He was overdue for eye exam and reminded him to schedule this   Examination:   BP 140/80 (BP Location: Left Arm, Patient Position: Sitting, Cuff Size: Normal)   Pulse 86   Ht 6' (1.829 m)   Wt 212 lb 6.4 oz (96.3 kg)   SpO2 96%   BMI 28.81 kg/m   Body mass index is 28.81 kg/m.   ASSESSMENT/ PLAN:     Diabetes type 2:  See history of present illness for detailed discussion of current diabetes management, blood sugar patterns and problems identified  Blood sugars have been overall well controlled with A1c now 6.5 with continuing his V-go pump He did not bring his monitor for download and not clear if he is consistently doing postprandial readings which were previously high at night Discussed that he tends to have higher readings with fast food such as when he came to the lab No microalbuminuria present   Recommendations: He will use Lantus insulin 24 units if he is not able to get V-go covered Needs to consistently check sugars after meals especially after supper Watch portions and snacks and avoid high calorie meals  Again reminded him to get appointment for his eye exam  Patient Instructions  call eye doctor for an exam  Check blood sugars on waking up  3/7  Also check blood sugars about 2 hours after a meal and do this after different meals by rotation  Recommended blood sugar levels on waking up is 90-130 and about 2 hours after meal is 130-160  Please bring your blood sugar monitor to each visit, thank you      Elayne Snare 01/14/2018, 1:23 PM   Note: This office note was prepared with Dragon voice recognition system technology. Any transcriptional errors that result from this process are unintentional.

## 2018-01-14 ENCOUNTER — Other Ambulatory Visit: Payer: Self-pay

## 2018-01-14 ENCOUNTER — Telehealth: Payer: Self-pay | Admitting: Endocrinology

## 2018-01-14 MED ORDER — ONETOUCH ULTRASOFT LANCETS MISC: 12 refills | Status: DC

## 2018-01-14 MED ORDER — V-GO 20 KIT: 1.0000 | PACK | Freq: Every day | 3 refills | Status: DC

## 2018-01-14 MED ORDER — ONETOUCH ULTRA 2 W/DEVICE KIT: 1.0000 | PACK | Freq: Every day | 0 refills | Status: DC

## 2018-01-14 MED ORDER — GLUCOSE BLOOD VI STRP: ORAL_STRIP | 12 refills | Status: DC

## 2018-01-14 NOTE — Telephone Encounter (Signed)
New meter sent to pharmacy per pt request.

## 2018-01-14 NOTE — Telephone Encounter (Signed)
Patient called back and stated that he is using the One Touch Ultra 2

## 2018-01-14 NOTE — Telephone Encounter (Signed)
Patient received his Vgo pump today. And stated that there was a prescription for LANTUS sent in but wanted to make sure if he needed this or not.  Please advise

## 2018-01-14 NOTE — Telephone Encounter (Signed)
Called pt and clarified that if he has V-Go pumps then he does not need the Lantus. Pt requested that a Rx be sent in for the next 90 days of V-Go pumps as well as onetouch test strips and lancets. This was done.

## 2018-01-17 ENCOUNTER — Other Ambulatory Visit: Payer: Self-pay

## 2018-01-17 MED ORDER — GLUCOSE BLOOD VI STRP: ORAL_STRIP | 3 refills | Status: DC

## 2018-01-17 MED ORDER — ACCU-CHEK AVIVA PLUS W/DEVICE KIT: PACK | 0 refills | Status: DC

## 2018-01-17 MED ORDER — ONETOUCH DELICA LANCETS 33G MISC: 1.0000 | Freq: Every day | 3 refills | Status: DC

## 2018-01-17 MED ORDER — ACCU-CHEK SOFTCLIX LANCETS MISC: 3 refills | Status: DC

## 2018-01-20 ENCOUNTER — Other Ambulatory Visit: Payer: Medicare HMO

## 2018-01-23 ENCOUNTER — Ambulatory Visit: Payer: Medicare HMO | Admitting: Endocrinology

## 2018-02-03 ENCOUNTER — Other Ambulatory Visit: Payer: Self-pay

## 2018-02-03 MED ORDER — ACCU-CHEK FASTCLIX LANCETS MISC: 1.0000 | Freq: Every day | 2 refills | Status: DC

## 2018-02-03 MED ORDER — GLUCOSE BLOOD VI STRP: 1.0000 | ORAL_STRIP | 3 refills | Status: DC | PRN

## 2018-02-03 MED ORDER — ACCU-CHEK GUIDE ME W/DEVICE KIT: 1.0000 | PACK | Freq: Every day | 0 refills | Status: DC

## 2018-02-10 ENCOUNTER — Encounter: Payer: Medicare HMO | Admitting: Neurology

## 2018-02-14 ENCOUNTER — Telehealth (INDEPENDENT_AMBULATORY_CARE_PROVIDER_SITE_OTHER): Payer: Self-pay | Admitting: Orthopedic Surgery

## 2018-02-14 NOTE — Telephone Encounter (Signed)
Ok for order?  

## 2018-02-14 NOTE — Telephone Encounter (Signed)
Ok fax note to [facility] for new liner

## 2018-02-14 NOTE — Telephone Encounter (Signed)
Patient is requesting prescription for new liners in his prosthetic. He said one has a hole but also from where its not suctioning good he is starting to get sore on his stump, not an actual sore yet. He wants this faxed to [facility]. Patients # [phone]

## 2018-02-17 ENCOUNTER — Other Ambulatory Visit (INDEPENDENT_AMBULATORY_CARE_PROVIDER_SITE_OTHER): Payer: Self-pay

## 2018-02-17 NOTE — Telephone Encounter (Signed)
Orders faxed to [facility] for right BKA

## 2018-02-24 ENCOUNTER — Telehealth: Payer: Self-pay

## 2018-02-24 NOTE — Telephone Encounter (Signed)
Patient called today to see where we are on the request for diabetic shoes- please call him back

## 2018-02-24 NOTE — Telephone Encounter (Signed)
Called pt and asked him to have form faxed to this office again.

## 2018-02-28 ENCOUNTER — Telehealth: Payer: Self-pay | Admitting: Endocrinology

## 2018-02-28 NOTE — Telephone Encounter (Signed)
Pt checking to see if we have received the diabetic shoe forms from [facility] yet?

## 2018-02-28 NOTE — Telephone Encounter (Signed)
This form was received today.

## 2018-03-14 ENCOUNTER — Other Ambulatory Visit: Payer: Self-pay | Admitting: Endocrinology

## 2018-03-17 DIAGNOSIS — Z89511 Acquired absence of right leg below knee: Secondary | ICD-10-CM | POA: Diagnosis not present

## 2018-03-18 ENCOUNTER — Encounter: Payer: Medicare HMO | Admitting: Neurology

## 2018-04-15 ENCOUNTER — Other Ambulatory Visit (INDEPENDENT_AMBULATORY_CARE_PROVIDER_SITE_OTHER): Payer: Medicare HMO

## 2018-04-15 ENCOUNTER — Other Ambulatory Visit: Payer: Medicare HMO

## 2018-04-15 DIAGNOSIS — E1165 Type 2 diabetes mellitus with hyperglycemia: Secondary | ICD-10-CM | POA: Diagnosis not present

## 2018-04-15 DIAGNOSIS — Z794 Long term (current) use of insulin: Secondary | ICD-10-CM | POA: Diagnosis not present

## 2018-04-15 LAB — BASIC METABOLIC PANEL
BUN: 12 mg/dL (ref 6–23)
CO2: 27 mEq/L (ref 19–32)
Calcium: 9.1 mg/dL (ref 8.4–10.5)
Chloride: 101 mEq/L (ref 96–112)
Creatinine, Ser: 0.9 mg/dL (ref 0.40–1.50)
GFR: 92.94 mL/min (ref >60.00)
Glucose, Bld: 141 mg/dL — ABNORMAL HIGH (ref 70–99)
Potassium: 4 mEq/L (ref 3.5–5.1)
Sodium: 136 mEq/L (ref 135–145)

## 2018-04-15 LAB — HEMOGLOBIN A1C: Hgb A1c MFr Bld: 6.4 % (ref 4.6–6.5)

## 2018-04-16 ENCOUNTER — Ambulatory Visit (INDEPENDENT_AMBULATORY_CARE_PROVIDER_SITE_OTHER): Payer: Medicare HMO | Admitting: Endocrinology

## 2018-04-16 ENCOUNTER — Encounter: Payer: Self-pay | Admitting: Endocrinology

## 2018-04-16 VITALS — BP 122/75 | HR 102 | Ht 72.0 in | Wt 209.6 lb

## 2018-04-16 DIAGNOSIS — Z794 Long term (current) use of insulin: Secondary | ICD-10-CM | POA: Diagnosis not present

## 2018-04-16 DIAGNOSIS — E1165 Type 2 diabetes mellitus with hyperglycemia: Secondary | ICD-10-CM | POA: Diagnosis not present

## 2018-04-16 NOTE — Progress Notes (Signed)
Gregory May 57 y.o.           Reason for Appointment: Diabetes follow-up   History of Present Illness   Diagnosis: Type 2 DIABETES MELITUS, date of diagnosis:  1999     Previous history: He has previously been treated with metformin and Victoza and subsequently mealtime insulin added to control postprandial hyperglycemia Overall he has been  relatively noncompliant with his diet, medications, monitoring and followup Previously would not take Victoza regularly because of cost. Has not been taking any medications for the last year and a half because of commitments to family and cost A1c had increased to 12.3% and hyperglycemia discovered when he was hospitalized for foot ulcer. Also lost 20 pounds because of hyperglycemia   For better control, compliance and inconvenience he was switched to the V-go pump on 02/21/16  Recent history:   Insulin regimen: V-go 20 unit basal, mealtime boluses 12-16 breakfast ---10-12 units dinner  Non-insulin hypoglycemic drugs: Metformin ER 1500 mg daily, Bydureon 2 mg weekly          Highest A1c was 8% last year, lowest A1c has been 6.3 in 2015 ; it is now 6.4     Management, blood sugar patterns and problems identified.  He has brought his monitor for download today  He appears to be checking his blood sugars mostly late afternoon and before suppertime and occasionally after supper at night  Only rarely has done fasting readings  However most of his blood sugars are excellent with only sporadic readings around 160-170 either after breakfast or evening meal  He is trying to be fairly consistent with watching his diet with only occasionally splurging with ice cream  Also trying to bolus consistently with evening meals  He says that he will take as much as 18 units bolus coverage when he is eating spaghetti or otherwise may get by with 10 to 12 units  Again mostly eating late breakfast and an evening meal and no other meals in the  afternoon  No side effects of Bydureon and he is able to get this consistently  Also now getting his V-go pump from [facility]  Only once had a low normal sugar of 66 before suppertime with usually not having any symptomatic hypoglycemia       Side effects from medications: None       Monitors blood glucose: Less than once a day .  Glucometer: One Touch.          Blood Glucose readings:    PRE-MEAL Fasting Lunch Dinner Bedtime Overall  Glucose range:  114, 147   66-104  82-134   Mean/median:     101   POST-MEAL PC Breakfast PC Lunch PC Dinner  Glucose range:    83-167  Mean/median:    98     Meals:  usually 2 meals per day usually.  breakfast at 12 noon, evening meal 7-9 pm, variable snacks late at night    Physical activity: exercise: Minimal               Weight control:   Wt Readings from Last 3 Encounters:  04/16/18 209 lb 9.6 oz (95.1 kg)  01/13/18 212 lb 6.4 oz (96.3 kg)  12/19/17 213 lb 1.6 oz (96.7 kg)          Diabetes labs:  Lab Results  Component Value Date   HGBA1C 6.4 04/15/2018   HGBA1C 6.5 01/09/2018   HGBA1C 6.8 (H) 10/22/2017   Lab Results  Component Value Date   MICROALBUR 1.5 01/09/2018   LDLCALC 87 10/22/2017   CREATININE 0.90 04/15/2018    Lab Results  Component Value Date   FRUCTOSAMINE 243 03/07/2017   FRUCTOSAMINE 274 10/30/2016   FRUCTOSAMINE 260 04/13/2016     Other active problems: See review of systems    Lab on 04/15/2018  Component Date Value Ref Range Status  . Sodium 04/15/2018 136  135 - 145 mEq/L Final  . Potassium 04/15/2018 4.0  3.5 - 5.1 mEq/L Final  . Chloride 04/15/2018 101  96 - 112 mEq/L Final  . CO2 04/15/2018 27  19 - 32 mEq/L Final  . Glucose, Bld 04/15/2018 141* 70 - 99 mg/dL Final  . BUN 04/15/2018 12  6 - 23 mg/dL Final  . Creatinine, Ser 04/15/2018 0.90  0.40 - 1.50 mg/dL Final  . Calcium 04/15/2018 9.1  8.4 - 10.5 mg/dL Final  . GFR 04/15/2018 92.94  >60.00 mL/min Final  . Hgb A1c MFr Bld 04/15/2018  6.4  4.6 - 6.5 % Final   Glycemic Control Guidelines for People with Diabetes:Non Diabetic:  <6%Goal of Therapy: <7%Additional Action Suggested:  >8%      Allergies as of 04/16/2018   No Known Allergies     Medication List        Accurate as of 04/16/18 11:59 PM. Always use your most recent med list.          ACCU-CHEK FASTCLIX LANCETS Misc 1 each by Does not apply route daily.   ACCU-CHEK GUIDE ME w/Device Kit 1 each by Does not apply route daily.   acetaminophen 325 MG tablet Commonly known as:  TYLENOL Take 650 mg by mouth every 6 (six) hours as needed.   cholecalciferol 1000 units tablet Commonly known as:  VITAMIN D Take 1,000 Units by mouth daily.   Exenatide ER 2 MG Pen INJECT 1  SUBCUTANEOUSLY ONCE A WEEK   gabapentin 300 MG capsule Commonly known as:  NEURONTIN Take 1 capsule (300 mg total) by mouth 3 (three) times daily.   glucose blood test strip 1 each by Other route as needed for other. Use as instructed to check blood sugar twice daily.   insulin aspart 100 UNIT/ML injection Commonly known as:  novoLOG INJECT 56 UNITS SUBCUTANEOUSLY ONCE DAILY IN  V-GO  PUMP   metFORMIN 750 MG 24 hr tablet Commonly known as:  GLUCOPHAGE-XR TAKE 2 TABLETS BY MOUTH ONCE DAILY   pravastatin 40 MG tablet Commonly known as:  PRAVACHOL Take 1 tablet (40 mg total) by mouth daily.   V-GO 20 Kit 1 each by Does not apply route daily. Apply Vgo20 pump to arm once daily.       Allergies: No Known Allergies  Past Medical History:  Diagnosis Date  . Arthritis    bilateral hips  . Deep vein thrombosis (DVT) ([place])   . Diabetes mellitus    type II  . 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 DJD (degenerative joint disease)   . Pulmonary emboli ([place]) 02/08/2014   Date of diagnosis February 08 2014, on chest CTA Hospitalized for 3 days Had some symptoms of shortness of breath, and chest pain With intercurrent DVT of the left LE. Duration of anticoagulation: 8 months. End  date 10/11/2014.  Anticoagulant: Lovenox 120 units daily Switched to Eliquis on 05/25/2014 per patient preference   . Pulmonary embolism ([place])   . Sebaceous cyst    on back of neck  Past Surgical History:  Procedure Laterality Date  . AMPUTATION Right 06/03/2015   Procedure: Right Below Knee Amputation;  Surgeon: [name], MD;  Location: [place];  Service: Orthopedics;  Laterality: Right;  . CLOSED REDUCTION WITH HUMER PIN INSERTION  1974   left hip  . HARDWARE REMOVAL Left 07/21/2014   Procedure: HARDWARE REMOVAL;  Surgeon: Ninetta Lights, MD;  Location: [place];  Service: Orthopedics;  Laterality: Left;  . JOINT REPLACEMENT  2006 (approx)   right hip replaced  . ROTATOR CUFF REPAIR  2005 (approx)   right   . 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 TOTAL HIP ARTHROPLASTY Left 07/21/2014   Procedure: TOTAL HIP ARTHROPLASTY ANTERIOR APPROACH;  Surgeon: Ninetta Lights, MD;  Location: [place];  Service: Orthopedics;  Laterality: Left;    Family History  Problem Relation Age of Onset  . Cancer Mother        breast, colon, liver  . Diabetes Father     Social History:  reports that he has never smoked. He has never used smokeless tobacco. He reports that he drinks alcohol. He reports that he does not use drugs.  Review of Systems:   Lipids: He is on Pravachol  with good control of LDL  This is also followed by PCP   Lab Results  Component Value Date   CHOL 170 10/22/2017   HDL 45.10 10/22/2017   LDLCALC 87 10/22/2017   LDLDIRECT 91.0 07/11/2015   TRIG 193.0 (H) 10/22/2017   CHOLHDL 4 10/22/2017    No history of hypertension,  blood pressure on second measurement was normal  BP Readings from Last 3 Encounters:  04/16/18 122/75  01/13/18 140/80  12/19/17 (!) 142/84     He is still overdue for eye exam and again reminded him to schedule this   Examination:   BP 122/75   Pulse (!) 102   Ht 6' (1.829 m)   Wt 209 lb 9.6 oz (95.1 kg)   SpO2 95%    BMI 28.43 kg/m   Body mass index is 28.43 kg/m.   ASSESSMENT/ PLAN:     Diabetes type 2:  See history of present illness for detailed discussion of current diabetes management, blood sugar patterns and problems identified  Blood sugars have been overall well controlled  A1c consistent at 6.4  He is doing very well with the V-go pump and although not clear if his fasting readings are consistently normal he is not having any significant postprandial hypoglycemia as documented at home He is occasionally having high readings from eating dessert and large amounts of carbohydrate with most of his readings are fairly good and he is fairly motivated to take care of his diabetes   Again reminded him to get appointment for his eye exam  Patient Instructions  Check blood sugars on waking up  3/7  Also check blood sugars about 2 hours after a meal and do this after different meals by rotation  Recommended blood sugar levels on waking up is 80-130 and about 2 hours after meal is 130-160  Please bring your blood sugar monitor to each visit, thank you  Get eye exam     Elayne Snare 04/17/2018, 8:31 AM   Note: This office note was prepared with Dragon voice recognition system technology. Any transcriptional errors that result from this process are unintentional.

## 2018-04-16 NOTE — Patient Instructions (Addendum)
Check blood sugars on waking up  3/7  Also check blood sugars about 2 hours after a meal and do this after different meals by rotation  Recommended blood sugar levels on waking up is 80-130 and about 2 hours after meal is 130-160  Please bring your blood sugar monitor to each visit, thank you  Get eye exam

## 2018-04-21 NOTE — Addendum Note (Signed)
Addended by: Neomia Dear on: 04/21/2018 04:23 PM   Modules accepted: Orders

## 2018-05-08 ENCOUNTER — Telehealth: Payer: Self-pay | Admitting: Endocrinology

## 2018-05-08 NOTE — Telephone Encounter (Signed)
Please advise on form status, no documentation in chart about form.

## 2018-05-08 NOTE — Telephone Encounter (Signed)
Will be faxed when form completed

## 2018-05-08 NOTE — Telephone Encounter (Signed)
Calling to see if fax was receive from them for Letter of Medical Necessity  for diabetic shoes for pt

## 2018-05-08 NOTE — Telephone Encounter (Signed)
Will be ready on Monday 

## 2018-05-10 ENCOUNTER — Other Ambulatory Visit: Payer: Self-pay | Admitting: Student in an Organized Health Care Education/Training Program

## 2018-05-12 ENCOUNTER — Telehealth: Payer: Self-pay | Admitting: Endocrinology

## 2018-05-12 NOTE — Telephone Encounter (Signed)
Paperwork for the patient was received by [facility] they just need the note from the foot exam before they can proceed F# [phone]

## 2018-05-13 NOTE — Telephone Encounter (Signed)
Please advise if this has been sent?

## 2018-05-13 NOTE — Telephone Encounter (Signed)
Re-faxed with the office note

## 2018-05-29 ENCOUNTER — Telehealth (INDEPENDENT_AMBULATORY_CARE_PROVIDER_SITE_OTHER): Payer: Self-pay | Admitting: Orthopedic Surgery

## 2018-05-29 NOTE — Telephone Encounter (Signed)
Patient called stating he received a disability update report asking if he is eligible to go back to work. Patient said he is on disability and don't think he can go back to work. Patient said he got a place on his left foot that is sore and tender.  The number to contact patient is [phone]

## 2018-05-30 ENCOUNTER — Ambulatory Visit (INDEPENDENT_AMBULATORY_CARE_PROVIDER_SITE_OTHER): Payer: Medicare HMO

## 2018-05-30 DIAGNOSIS — Z23 Encounter for immunization: Secondary | ICD-10-CM

## 2018-05-30 NOTE — Progress Notes (Signed)
Pt is here in the office for regular flu shot today.Marland Kitchen

## 2018-05-30 NOTE — Telephone Encounter (Signed)
Can you please call pt and make appt. He has not been seen in the office since last november. Would need to follow up on any problems he is having to extend note out of work.

## 2018-06-04 DIAGNOSIS — E1165 Type 2 diabetes mellitus with hyperglycemia: Secondary | ICD-10-CM | POA: Diagnosis not present

## 2018-06-06 ENCOUNTER — Other Ambulatory Visit: Payer: Self-pay | Admitting: Endocrinology

## 2018-06-09 ENCOUNTER — Encounter (INDEPENDENT_AMBULATORY_CARE_PROVIDER_SITE_OTHER): Payer: Self-pay | Admitting: Orthopedic Surgery

## 2018-06-09 ENCOUNTER — Ambulatory Visit (INDEPENDENT_AMBULATORY_CARE_PROVIDER_SITE_OTHER): Payer: Medicare HMO | Admitting: Orthopedic Surgery

## 2018-06-09 VITALS — Ht 72.0 in | Wt 209.6 lb

## 2018-06-09 DIAGNOSIS — Z89511 Acquired absence of right leg below knee: Secondary | ICD-10-CM

## 2018-06-09 DIAGNOSIS — B351 Tinea unguium: Secondary | ICD-10-CM | POA: Diagnosis not present

## 2018-06-09 DIAGNOSIS — E1142 Type 2 diabetes mellitus with diabetic polyneuropathy: Secondary | ICD-10-CM | POA: Diagnosis not present

## 2018-06-09 NOTE — Progress Notes (Signed)
Office Visit Note   Patient: Gregory May           Date of Birth: [birth year]           MRN: [id number] Visit Date: 06/09/2018              Requested by: Lanelle Bal, MD [address] [place], [place] [address] PCP: Lanelle Bal, MD  Chief Complaint  Patient presents with  . Right Leg - Follow-up  . Left Foot - Follow-up      HPI: Patient is a 57 year old gentleman who presents complaining some soreness on the left heel and painful fungus on the toenails left foot.  Patient states that he just had his prosthetic socket modified at [facility] and states he has a callus over the residual limb.  Assessment & Plan: Visit Diagnoses:  1. Acquired absence of right leg below knee (HCC)   2. Diabetic polyneuropathy associated with type 2 diabetes mellitus (HCC)   3. Onychomycosis     Plan: Recommended patient go up with ply or 2 on the stocking to provide more proximal support to minimize N bearing.  Patient has new orthotics in the left foot he will continue with the new orthotics patient was given instructions for cyano acrylic cement for treatment for the toenail fungus.  Patient is still fully disabled.  With patient's artificial hips transtibial amputation and pending ulceration on the left foot patient should not be working and patient has been disabled since 2017.  Follow-Up Instructions: Return in about 3 months (around 09/09/2018).   Ortho Exam  Patient is alert, oriented, no adenopathy, well-dressed, normal affect, normal respiratory effort. Examination patient has thickened discolored onychomycotic nails of the left foot.  After informed consent the nails were trimmed x5 there is no signs of a paronychial infection.  Patient has thin atrophic skin over the left heel the fat pad is completely gone the bone is palpable but there is no ulcers there is very mild callus.  Examination the right transtibial amputation he has thin callus over the end of the residual  limb.  There is significant limb atrophy.  Discussed modifications to the socket fitting.  Imaging: No results found. No images are attached to the encounter.  Labs: Lab Results  Component Value Date   HGBA1C 6.4 04/15/2018   HGBA1C 6.5 01/09/2018   HGBA1C 6.8 (H) 10/22/2017   ESRSEDRATE 56 (H) 06/01/2015   REPTSTATUS 06/07/2015 FINAL 06/01/2015   CULT  06/01/2015    NO GROWTH 5 DAYS Performed at [facility]      Lab Results  Component Value Date   ALBUMIN 4.0 01/09/2018   ALBUMIN 4.0 10/22/2017   ALBUMIN 3.8 06/13/2017    Body mass index is 28.43 kg/m.  Orders:  No orders of the defined types were placed in this encounter.  No orders of the defined types were placed in this encounter.    Procedures: No procedures performed  Clinical Data: No additional findings.  ROS:  All other systems negative, except as noted in the HPI. Review of Systems  Objective: Vital Signs: Ht 6' (1.829 m)   Wt 209 lb 9.6 oz (95.1 kg)   BMI 28.43 kg/m   Specialty Comments:  No specialty comments available.  PMFS History: Patient Active Problem List   Diagnosis Date Noted  . Peripheral neuropathy 12/19/2017  . Non-pressure chronic ulcer of right calf, limited to breakdown of skin (HCC) 04/17/2017  . Acquired absence of right leg below knee (  HCC) 04/17/2017  . Carpal tunnel syndrome 03/18/2017  . Healthcare maintenance 06/21/2015  . Status post below knee amputation of right lower extremity (HCC) 06/07/2015  . Diabetic polyneuropathy associated with type 2 diabetes mellitus (HCC) 12/09/2014  . Diabetes mellitus with carpal tunnel syndrome (HCC) 12/09/2014  . DJD (degenerative joint disease) of knee 07/21/2014  . Osteoarthritis, hip, bilateral 05/25/2014  . Pulmonary emboli (HCC) 02/08/2014  . DVT (deep venous thrombosis) (HCC) 02/08/2014  . Onychomycosis 10/14/2013  . Pure hypercholesterolemia 09/25/2013  . Diabetic foot ulcer (HCC) 09/15/2013  . Sebaceous  cyst 08/16/2011  . Poorly controlled type 2 diabetes mellitus with complication (HCC) 09/15/1997   Past Medical History:  Diagnosis Date  . Arthritis    bilateral hips  . Deep vein thrombosis (DVT) (HCC)   . Diabetes mellitus    type II  . 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 DJD (degenerative joint disease)   . Pulmonary emboli (HCC) 02/08/2014   Date of diagnosis February 08 2014, on chest CTA Hospitalized for 3 days Had some symptoms of shortness of breath, and chest pain With intercurrent DVT of the left LE. Duration of anticoagulation: 8 months. End date 10/11/2014.  Anticoagulant: Lovenox 120 units daily Switched to Eliquis on 05/25/2014 per patient preference   . Pulmonary embolism (HCC)   . Sebaceous cyst    on back of neck    Family History  Problem Relation Age of Onset  . Cancer Mother        breast, colon, liver  . Diabetes Father     Past Surgical History:  Procedure Laterality Date  . AMPUTATION Right 06/03/2015   Procedure: Right Below Knee Amputation;  Surgeon: Nadara Mustard, MD;  Location[facility];  Service: Orthopedics;  Laterality: Right;  . CLOSED REDUCTION WITH HUMER PIN INSERTION  1974   left hip  . HARDWARE REMOVAL Left 07/21/2014   Procedure: HARDWARE REMOVAL;  Surgeon: Loreta Ave, MD;  Location: [facility] OR;  Service: Orthopedics;  Laterality: Left;  . JOINT REPLACEMENT  2006 (approx)   right hip replaced  . ROTATOR CUFF REPAIR  2005 (approx)   right   . 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 TOTAL HIP ARTHROPLASTY Left 07/21/2014   Procedure: TOTAL HIP ARTHROPLASTY ANTERIOR APPROACH;  Surgeon: Loreta Ave, MD;  Location: [facility] OR;  Service: Orthopedics;  Laterality: Left;   Social History   Occupational History  . Not on file  Tobacco Use  . Smoking status: Never Smoker  . Smokeless tobacco: Never Used  Substance and Sexual Activity  . Alcohol use: Yes    Alcohol/week: 0.0 standard drinks    Comment: occasional  . Drug use:  No  . Sexual activity: Never

## 2018-06-18 ENCOUNTER — Encounter: Payer: Self-pay | Admitting: Internal Medicine

## 2018-06-18 ENCOUNTER — Ambulatory Visit (INDEPENDENT_AMBULATORY_CARE_PROVIDER_SITE_OTHER): Payer: Medicare HMO | Admitting: Internal Medicine

## 2018-06-18 ENCOUNTER — Other Ambulatory Visit: Payer: Self-pay

## 2018-06-18 VITALS — BP 137/83 | HR 84 | Temp 98.0°F | Wt 213.3 lb

## 2018-06-18 DIAGNOSIS — I10 Essential (primary) hypertension: Secondary | ICD-10-CM | POA: Diagnosis not present

## 2018-06-18 DIAGNOSIS — Z1211 Encounter for screening for malignant neoplasm of colon: Secondary | ICD-10-CM

## 2018-06-18 DIAGNOSIS — Z79899 Other long term (current) drug therapy: Secondary | ICD-10-CM

## 2018-06-18 DIAGNOSIS — G609 Hereditary and idiopathic neuropathy, unspecified: Secondary | ICD-10-CM

## 2018-06-18 DIAGNOSIS — E118 Type 2 diabetes mellitus with unspecified complications: Secondary | ICD-10-CM

## 2018-06-18 DIAGNOSIS — E1142 Type 2 diabetes mellitus with diabetic polyneuropathy: Secondary | ICD-10-CM | POA: Diagnosis not present

## 2018-06-18 DIAGNOSIS — Z794 Long term (current) use of insulin: Principal | ICD-10-CM

## 2018-06-18 DIAGNOSIS — E1165 Type 2 diabetes mellitus with hyperglycemia: Secondary | ICD-10-CM

## 2018-06-18 LAB — GLUCOSE, CAPILLARY: Glucose-Capillary: 78 mg/dL (ref 70–99)

## 2018-06-18 MED ORDER — LISINOPRIL 10 MG PO TABS: 10.0000 mg | ORAL_TABLET | Freq: Every day | ORAL | 1 refills | Status: DC

## 2018-06-18 NOTE — Patient Instructions (Signed)
FOLLOW-UP INSTRUCTIONS When: 3-4 months For: routine visit What to bring: All of your medications  I have not made any changes to your medications today.   Today we discussed your high blood pressure, diabetes, need for colon cancer screening, need for eye exam and your peripheral neuropathy.   Please continue to follow with Dr. Lucianne Muss for your diabetes.   Your blood pressure is slightly elevated today at 137/83. I would suggest that we consider adding a medication such as Amlodipine to better control this and will continue to discuss this with you at your next visit.   With regard to your colon cancer screening, please call your insurance company and notify us via phone if you are willing to proceed with a colonoscopy and I will place the referral. If not, we will order the stool sample test.  Please call your eye doctor and schedule to see them. If they require a referral please let me know.   I have placed the EMG referral again as well. Please be sure and keep this appointment if placed.   Thank you for your visit to the [facility] today. If you have any questions or concerns please call us at [phone].

## 2018-06-18 NOTE — Progress Notes (Signed)
   CC: Routine for HTN, Diabetes and peripheral neuropathy  HPI:Mr.ASMAR BROZEK is a 57 y.o. male who presents for evaluation of HTN, diabetes and peripheral neuropathy. Please see individual problem based A/P for details.  Peripheral Neuropathy: Needs EMG ordered as he was unable to afford the co-pay with the last EMG when they called to schedule. He apologized for this but stated that he has now met his insurance deductible and would like to proceed. The pain has persisted and is not entirely classic for diabetic neuropathy and as such warrants further evaluation to rule out such.   Plan: Placed order for EMG  HTN: Moderately well controlled today at 137/83. Patient agreeable to initiating Lisinopril '10mg'$  daily.  Plan: Start Lisinopril '10mg'$  daily Patients most recent BMP last month with potassium of 4.0 and Scr of 0.90.  Diabetes: Follows with Dr. Dwyane Dee endocrinology, last A1c 6.4%. This has improved. I will not make any changes to his regimen.  Colon cancer screening: Patient stated that he will call his insurance company and if a colonoscopy is affordable he will proceed with this. If not, we will reflex to the FIT testing.  Eye exam: Patient will call his ophthalmologist to schedule an appointment. If he requires a referral he will call us back.  PHQ-9: Based on the patients    Office Visit from 07/27/2015 in [facility]  PHQ-9 Total Score  0     score we have decided to monitor.  Past Medical History:  Diagnosis Date  . Arthritis    bilateral hips  . Deep vein thrombosis (DVT) ([place])   . Diabetes mellitus    type II  . 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 DJD (degenerative joint disease)   . Pulmonary emboli ([place]) 02/08/2014   Date of diagnosis February 08 2014, on chest CTA Hospitalized for 3 days Had some symptoms of shortness of breath, and chest pain With intercurrent DVT of the left LE. Duration of anticoagulation:  8 months. End date 10/11/2014.  Anticoagulant: Lovenox 120 units daily Switched to Eliquis on 05/25/2014 per patient preference   . Pulmonary embolism ([place])   . Sebaceous cyst    on back of neck   Review of Systems:  ROS negative except as per HPI.  Physical Exam: Vitals:   06/18/18 1603  BP: 137/83  Pulse: 84  Temp: 98 F (36.7 C)  TempSrc: Oral  SpO2: 97%  Weight: 213 lb 4.8 oz (96.8 kg)   General: A/O x4, in no acute distress, afebrile, nondiaphoretic Cardio: RRR, no mrg's Pulmonary: CTA bilaterally   Assessment & Plan:   See Encounters Tab for problem based charting.  Patient discussed with Dr. Lynnae January

## 2018-06-19 DIAGNOSIS — I10 Essential (primary) hypertension: Secondary | ICD-10-CM | POA: Insufficient documentation

## 2018-06-19 NOTE — Assessment & Plan Note (Signed)
Colon cancer screening: Patient stated that he will call his insurance company and if a colonoscopy is affordable he will proceed with this. If not, we will reflex to the FIT testing.

## 2018-06-19 NOTE — Assessment & Plan Note (Signed)
HTN: Moderately well controlled today at 137/83. Patient agreeable to initiating Lisinopril 10mg  daily.  Plan: Start Lisinopril 10mg  daily Patients most recent BMP last month with potassium of 4.0 and Scr of 0.90.

## 2018-06-19 NOTE — Progress Notes (Signed)
Internal Medicine Clinic Attending  Case discussed with Dr. Harbrecht at the time of the visit.  We reviewed the resident's history and exam and pertinent patient test results.  I agree with the assessment, diagnosis, and plan of care documented in the resident's note.   

## 2018-06-19 NOTE — Assessment & Plan Note (Signed)
Peripheral Neuropathy: Needs EMG ordered as he was unable to afford the co-pay with the last EMG when they called to schedule. He apologized for this but stated that he has now met his insurance deductible and would like to proceed. The pain has persisted and is not entirely classic for diabetic neuropathy and as such warrants further evaluation to rule out such.   Plan: Placed order for EMG

## 2018-06-19 NOTE — Assessment & Plan Note (Signed)
Diabetes: Follows with Dr. Lucianne Muss endocrinology, last A1c 6.4%. This has improved. I will not make any changes to his regimen.

## 2018-07-16 DIAGNOSIS — E119 Type 2 diabetes mellitus without complications: Secondary | ICD-10-CM | POA: Diagnosis not present

## 2018-07-16 LAB — HM DIABETES EYE EXAM

## 2018-07-17 ENCOUNTER — Other Ambulatory Visit: Payer: Self-pay | Admitting: Pharmacist

## 2018-07-17 DIAGNOSIS — E7800 Pure hypercholesterolemia, unspecified: Secondary | ICD-10-CM

## 2018-07-17 MED ORDER — PRAVASTATIN SODIUM 40 MG PO TABS: 40.0000 mg | ORAL_TABLET | Freq: Every day | ORAL | 3 refills | Status: DC

## 2018-07-18 ENCOUNTER — Other Ambulatory Visit (INDEPENDENT_AMBULATORY_CARE_PROVIDER_SITE_OTHER): Payer: Medicare HMO

## 2018-07-18 DIAGNOSIS — E1165 Type 2 diabetes mellitus with hyperglycemia: Secondary | ICD-10-CM

## 2018-07-18 DIAGNOSIS — Z794 Long term (current) use of insulin: Secondary | ICD-10-CM

## 2018-07-18 LAB — COMPREHENSIVE METABOLIC PANEL
ALT: 47 U/L (ref 0–53)
AST: 45 U/L — ABNORMAL HIGH (ref 0–37)
Albumin: 4.1 g/dL (ref 3.5–5.2)
Alkaline Phosphatase: 45 U/L (ref 39–117)
BUN: 15 mg/dL (ref 6–23)
CO2: 28 mEq/L (ref 19–32)
Calcium: 9.8 mg/dL (ref 8.4–10.5)
Chloride: 100 mEq/L (ref 96–112)
Creatinine, Ser: 0.93 mg/dL (ref 0.40–1.50)
GFR: 89.4 mL/min (ref >60.00)
Glucose, Bld: 89 mg/dL (ref 70–99)
Potassium: 4.2 mEq/L (ref 3.5–5.1)
Sodium: 136 mEq/L (ref 135–145)
Total Bilirubin: 0.3 mg/dL (ref 0.2–1.2)
Total Protein: 7.2 g/dL (ref 6.0–8.3)

## 2018-07-18 LAB — HEMOGLOBIN A1C: Hgb A1c MFr Bld: 6.7 % — ABNORMAL HIGH (ref 4.6–6.5)

## 2018-07-18 LAB — LIPID PANEL
Cholesterol: 162 mg/dL (ref 0–200)
HDL: 45.6 mg/dL (ref >39.00)
LDL Cholesterol: 102 mg/dL — ABNORMAL HIGH (ref 0–99)
NonHDL: 116.56
Total CHOL/HDL Ratio: 4
Triglycerides: 71 mg/dL (ref 0.0–149.0)
VLDL: 14.2 mg/dL (ref 0.0–40.0)

## 2018-07-22 ENCOUNTER — Encounter: Payer: Self-pay | Admitting: Endocrinology

## 2018-07-22 ENCOUNTER — Ambulatory Visit (INDEPENDENT_AMBULATORY_CARE_PROVIDER_SITE_OTHER): Payer: Medicare HMO | Admitting: Endocrinology

## 2018-07-22 VITALS — BP 102/68 | HR 80 | Ht 72.0 in | Wt 210.4 lb

## 2018-07-22 DIAGNOSIS — E1165 Type 2 diabetes mellitus with hyperglycemia: Secondary | ICD-10-CM

## 2018-07-22 DIAGNOSIS — Z794 Long term (current) use of insulin: Secondary | ICD-10-CM

## 2018-07-22 DIAGNOSIS — E7800 Pure hypercholesterolemia, unspecified: Secondary | ICD-10-CM

## 2018-07-22 MED ORDER — LISINOPRIL 5 MG PO TABS: 5.0000 mg | ORAL_TABLET | Freq: Every day | ORAL | 3 refills | Status: DC

## 2018-07-22 MED ORDER — PRAVASTATIN SODIUM 80 MG PO TABS: 80.0000 mg | ORAL_TABLET | Freq: Every day | ORAL | 3 refills | Status: DC

## 2018-07-22 NOTE — Patient Instructions (Addendum)
Check blood sugars on waking up 3 days a week  Also check blood sugars about 2 hours after meals and do this after different meals by rotation  Recommended blood sugar levels on waking up are 90-130 and about 2 hours after meal is 130-160  Please bring your blood sugar monitor to each visit, thank you  2x Pravastatin  New Lisinopril Rx

## 2018-07-22 NOTE — Progress Notes (Signed)
Gregory May 57 y.o.           Reason for Appointment: Diabetes follow-up   History of Present Illness   Diagnosis: Type 2 DIABETES MELITUS, date of diagnosis:  1999     Previous history: He has previously been treated with metformin and Victoza and subsequently mealtime insulin added to control postprandial hyperglycemia Overall he has been  relatively noncompliant with his diet, medications, monitoring and followup Previously would not take Victoza regularly because of cost. Has not been taking any medications for the last year and a half because of commitments to family and cost A1c had increased to 12.3% and hyperglycemia discovered when he was hospitalized for foot ulcer. Also lost 20 pounds because of hyperglycemia   For better control, compliance and inconvenience he was switched to the V-go pump on 02/21/16  Recent history:   Insulin regimen: V-go 20 unit basal, mealtime boluses 12-16 breakfast ---16-20 units dinner  Non-insulin hypoglycemic drugs: Metformin ER 1500 mg daily, Bydureon 2 mg weekly         A1c  is now 6.7     Management, blood sugar patterns and problems identified.  He has brought his monitor for download today again  He is taking relatively large amounts of insulin for his meals and only relatively small amounts of basal  This is despite taking Bydureon  Occasionally will be a day or 2 late with taking his Bydureon  He says he is eating sometimes more sweets like cookies and this will make his sugar go up including late at night  Otherwise he has fairly good blood sugars fasting and in the lab before lunch  Usually eating 2 meals a day and generally using of his boluses for these meals and snacks  No hypoglycemic symptoms       Side effects from medications: None       Monitors blood glucose: Once a day .  Glucometer: One Touch.          Blood Glucose readings:   Am  100-183 After meals: 110-170  Meals:  usually 2 meals per day  usually.  breakfast at 12 noon, evening meal 7-9 pm, variable snacks late at night    Physical activity: exercise: Minimal               Weight control:   Wt Readings from Last 3 Encounters:  07/22/18 210 lb 6.4 oz (95.4 kg)  06/18/18 213 lb 4.8 oz (96.8 kg)  06/09/18 209 lb 9.6 oz (95.1 kg)          Diabetes labs:  Lab Results  Component Value Date   HGBA1C 6.7 (H) 07/18/2018   HGBA1C 6.4 04/15/2018   HGBA1C 6.5 01/09/2018   Lab Results  Component Value Date   MICROALBUR 1.5 01/09/2018   LDLCALC 102 (H) 07/18/2018   CREATININE 0.93 07/18/2018    Lab Results  Component Value Date   FRUCTOSAMINE 243 03/07/2017   FRUCTOSAMINE 274 10/30/2016   FRUCTOSAMINE 260 04/13/2016     Other active problems: See review of systems    Lab on 07/18/2018  Component Date Value Ref Range Status  . Cholesterol 07/18/2018 162  0 - 200 mg/dL Final   ATP III Classification       Desirable:  < 200 mg/dL               Borderline High:  200 - 239 mg/dL          High:  > =  240 mg/dL  . Triglycerides 07/18/2018 71.0  0.0 - 149.0 mg/dL Final   Normal:  <150 mg/dLBorderline High:  150 - 199 mg/dL  . HDL 07/18/2018 45.60  >39.00 mg/dL Final  . VLDL 07/18/2018 14.2  0.0 - 40.0 mg/dL Final  . LDL Cholesterol 07/18/2018 102* 0 - 99 mg/dL Final  . Total CHOL/HDL Ratio 07/18/2018 4   Final                  Men          Women1/2 Average Risk     3.4          3.3Average Risk          5.0          4.42X Average Risk          9.6          7.13X Average Risk          15.0          11.0                      . NonHDL 07/18/2018 116.56   Final   NOTE:  Non-HDL goal should be 30 mg/dL higher than patient's LDL goal (i.e. LDL goal of < 70 mg/dL, would have non-HDL goal of < 100 mg/dL)  . Sodium 07/18/2018 136  135 - 145 mEq/L Final  . Potassium 07/18/2018 4.2  3.5 - 5.1 mEq/L Final  . Chloride 07/18/2018 100  96 - 112 mEq/L Final  . CO2 07/18/2018 28  19 - 32 mEq/L Final  . Glucose, Bld 07/18/2018 89  70 -  99 mg/dL Final  . BUN 07/18/2018 15  6 - 23 mg/dL Final  . Creatinine, Ser 07/18/2018 0.93  0.40 - 1.50 mg/dL Final  . Total Bilirubin 07/18/2018 0.3  0.2 - 1.2 mg/dL Final  . Alkaline Phosphatase 07/18/2018 45  39 - 117 U/L Final  . AST 07/18/2018 45* 0 - 37 U/L Final  . ALT 07/18/2018 47  0 - 53 U/L Final  . Total Protein 07/18/2018 7.2  6.0 - 8.3 g/dL Final  . Albumin 07/18/2018 4.1  3.5 - 5.2 g/dL Final  . Calcium 07/18/2018 9.8  8.4 - 10.5 mg/dL Final  . GFR 07/18/2018 89.40  >60.00 mL/min Final  . Hgb A1c MFr Bld 07/18/2018 6.7* 4.6 - 6.5 % Final   Glycemic Control Guidelines for People with Diabetes:Non Diabetic:  <6%Goal of Therapy: <7%Additional Action Suggested:  >8%      Allergies as of 07/22/2018   No Known Allergies     Medication List        Accurate as of 07/22/18  4:52 PM. Always use your most recent med list.          ACCU-CHEK FASTCLIX LANCETS Misc 1 each by Does not apply route daily.   ACCU-CHEK GUIDE ME w/Device Kit 1 each by Does not apply route daily.   acetaminophen 325 MG tablet Commonly known as:  TYLENOL Take 650 mg by mouth every 6 (six) hours as needed.   cholecalciferol 1000 units tablet Commonly known as:  VITAMIN D Take 1,000 Units by mouth daily.   Exenatide ER 2 MG Pen INJECT 1  SUBCUTANEOUSLY ONCE A WEEK   gabapentin 300 MG capsule Commonly known as:  NEURONTIN Take 1 capsule (300 mg total) by mouth 3 (three) times daily.   glucose blood test strip 1 each by Other route as  needed for other. Use as instructed to check blood sugar twice daily.   insulin aspart 100 UNIT/ML injection Commonly known as:  novoLOG INJECT 56 UNITS SUBCUTANEOUSLY ONCE DAILY IN  V-GO  PUMP   lisinopril 5 MG tablet Commonly known as:  PRINIVIL,ZESTRIL Take 1 tablet (5 mg total) by mouth daily.   metFORMIN 750 MG 24 hr tablet Commonly known as:  GLUCOPHAGE-XR TAKE 2 TABLETS BY MOUTH ONCE DAILY   pravastatin 80 MG tablet Commonly known as:   PRAVACHOL Take 1 tablet (80 mg total) by mouth daily.   V-GO 20 Kit 1 each by Does not apply route daily. Apply Vgo20 pump to arm once daily.       Allergies: No Known Allergies  Past Medical History:  Diagnosis Date  . Arthritis    bilateral hips  . Deep vein thrombosis (DVT) ([place])   . Diabetes mellitus    type II  . 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 DJD (degenerative joint disease)   . Pulmonary emboli ([place]) 02/08/2014   Date of diagnosis February 08 2014, on chest CTA Hospitalized for 3 days Had some symptoms of shortness of breath, and chest pain With intercurrent DVT of the left LE. Duration of anticoagulation: 8 months. End date 10/11/2014.  Anticoagulant: Lovenox 120 units daily Switched to Eliquis on 05/25/2014 per patient preference   . Pulmonary embolism ([place])   . Sebaceous cyst    on back of neck    Past Surgical History:  Procedure Laterality Date  . AMPUTATION Right 06/03/2015   Procedure: Right Below Knee Amputation;  Surgeon: [name], MD;  Location: [facility];  Service: Orthopedics;  Laterality: Right;  . CLOSED REDUCTION WITH HUMER PIN INSERTION  1974   left hip  . HARDWARE REMOVAL Left 07/21/2014   Procedure: HARDWARE REMOVAL;  Surgeon: Ninetta Lights, MD;  Location: [facility];  Service: Orthopedics;  Laterality: Left;  . JOINT REPLACEMENT  2006 (approx)   right hip replaced  . ROTATOR CUFF REPAIR  2005 (approx)   right   . 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 TOTAL HIP ARTHROPLASTY Left 07/21/2014   Procedure: TOTAL HIP ARTHROPLASTY ANTERIOR APPROACH;  Surgeon: Ninetta Lights, MD;  Location: [facility];  Service: Orthopedics;  Laterality: Left;    Family History  Problem Relation Age of Onset  . Cancer Mother        breast, colon, liver  . Diabetes Father     Social History:  reports that he has never smoked. He has never used smokeless tobacco. He reports that he drinks alcohol. He reports that he does not use drugs.  Review of  Systems:   Lipids: He is on Pravachol  with previously good control of LDL Now LDL is over 100  This is also followed by PCP   Lab Results  Component Value Date   CHOL 162 07/18/2018   HDL 45.60 07/18/2018   LDLCALC 102 (H) 07/18/2018   LDLDIRECT 91.0 07/11/2015   TRIG 71.0 07/18/2018   CHOLHDL 4 07/18/2018    Mild history of hypertension,  blood pressure usually higher when he first comes in His PCP has started him on lisinopril 10 mg He says that he will get dizzy when he bends over and gets up and this is a recent symptom  BP Readings from Last 3 Encounters:  07/22/18 102/68  06/18/18 137/83  04/16/18 122/75  Examination:   BP 102/68 (BP Location: Left Arm, Cuff Size: Normal)   Pulse 80   Ht 6' (1.829 m)   Wt 210 lb 6.4 oz (95.4 kg)   SpO2 94%   BMI 28.54 kg/m   Body mass index is 28.54 kg/m.   ASSESSMENT/ PLAN:     Diabetes type 2:  See history of present illness for detailed discussion of current diabetes management, blood sugar patterns and problems identified  Blood sugars have been overall well controlled  A1c is slightly higher at 6.7  Most of his high readings are postprandial depending on his diet compliance Discussed that he needs to cut back on carbohydrates and higher carbohydrate snacks Since he is doing fairly well with fasting readings he will continue the 20 unit basal on the pump We will need to check more readings after meals to help him adjust his mealtime doses although currently not getting hypoglycemia  Also on Bydureon and metformin Needs to take Bydureon consistently the same day of the week  LIPIDS: LDL is above target and he will go up to 80 mg on pravastatin Discussed LDL targets of at least under 100 and to avoid high saturated fat foods  Questionable hypertension: His blood pressure is low normal standing up today and he will reduce his lisinopril to 5 mg.  To call if he continues to have orthostasis or  dizziness  Counseling time on subjects discussed in assessment and plan sections is over 50% of today's 25 minute visit   Patient Instructions  Check blood sugars on waking up 3 days a week  Also check blood sugars about 2 hours after meals and do this after different meals by rotation  Recommended blood sugar levels on waking up are 90-130 and about 2 hours after meal is 130-160  Please bring your blood sugar monitor to each visit, thank you  2x Pravastatin  New Lisinopril Rx     Elayne Snare 07/22/2018, 4:52 PM   Note: This office note was prepared with Dragon voice recognition system technology. Any transcriptional errors that result from this process are unintentional.

## 2018-07-23 ENCOUNTER — Other Ambulatory Visit: Payer: Self-pay | Admitting: Endocrinology

## 2018-08-16 ENCOUNTER — Other Ambulatory Visit: Payer: Self-pay | Admitting: Endocrinology

## 2018-09-08 ENCOUNTER — Encounter (INDEPENDENT_AMBULATORY_CARE_PROVIDER_SITE_OTHER): Payer: Self-pay | Admitting: Orthopedic Surgery

## 2018-09-08 ENCOUNTER — Ambulatory Visit (INDEPENDENT_AMBULATORY_CARE_PROVIDER_SITE_OTHER): Payer: Medicare HMO | Admitting: Orthopedic Surgery

## 2018-09-08 DIAGNOSIS — Z89511 Acquired absence of right leg below knee: Secondary | ICD-10-CM | POA: Diagnosis not present

## 2018-09-08 DIAGNOSIS — B351 Tinea unguium: Secondary | ICD-10-CM | POA: Diagnosis not present

## 2018-09-08 DIAGNOSIS — L97211 Non-pressure chronic ulcer of right calf limited to breakdown of skin: Secondary | ICD-10-CM

## 2018-09-08 NOTE — Progress Notes (Signed)
Office Visit Note   Patient: Gregory May           Date of Birth: 1960-08-27           MRN: [id number] Visit Date: 09/08/2018              Requested by: Lanelle Bal, MD [address] [place], [place] [address] PCP: Lanelle Bal, MD  Chief Complaint  Patient presents with  . Left Foot - Follow-up      HPI: Patient is a 58 year old gentleman who presents for 2 separate issues #1 he states that the rash on the right transtibial amputation is improving but he still has a little bit of rash approximately around the hamstring tendons.  Patient states that he feels like his onychomycosis is improving with using superglue on the nails weekly.  Assessment & Plan: Visit Diagnoses:  1. Acquired absence of right leg below knee (HCC)   2. Non-pressure chronic ulcer of right calf, limited to breakdown of skin (HCC)   3. Onychomycosis     Plan: Nails were trimmed x5 without complications.  The Vive stump shrinker was applied to the residual limb and folded down so was 2 layers thick over the area of the rash.  He seems to have excess sweating from the 2 layers of the silicone liner.  Follow-Up Instructions: Return in about 4 weeks (around 10/06/2018).   Ortho Exam  Patient is alert, oriented, no adenopathy, well-dressed, normal affect, normal respiratory effort. Examination patient has much improved rash to the right transtibial amputation secondary to wearing the medical compression stocking.  There is no redness no cellulitis there is no distal ulcer or callus.  Examination the left foot patient has thickened discolored onychomycotic nails x5 he is unable to safely trim the nails on his own nails were trimmed x5 without complications.  There is no paronychial infection he is showing steady improvement with using the superglue on the nail.  Imaging: No results found. No images are attached to the encounter.  Labs: Lab Results  Component Value Date   HGBA1C 6.7 (H)  07/18/2018   HGBA1C 6.4 04/15/2018   HGBA1C 6.5 01/09/2018   ESRSEDRATE 56 (H) 06/01/2015   REPTSTATUS 06/07/2015 FINAL 06/01/2015   CULT  06/01/2015    NO GROWTH 5 DAYS Performed at [facility]      Lab Results  Component Value Date   ALBUMIN 4.1 07/18/2018   ALBUMIN 4.0 01/09/2018   ALBUMIN 4.0 10/22/2017    There is no height or weight on file to calculate BMI.  Orders:  No orders of the defined types were placed in this encounter.  No orders of the defined types were placed in this encounter.    Procedures: No procedures performed  Clinical Data: No additional findings.  ROS:  All other systems negative, except as noted in the HPI. Review of Systems  Objective: Vital Signs: There were no vitals taken for this visit.  Specialty Comments:  No specialty comments available.  PMFS History: Patient Active Problem List   Diagnosis Date Noted  . Essential hypertension 06/19/2018  . Peripheral neuropathy 12/19/2017  . Non-pressure chronic ulcer of right calf, limited to breakdown of skin (HCC) 04/17/2017  . Acquired absence of right leg below knee (HCC) 04/17/2017  . Carpal tunnel syndrome 03/18/2017  . Colon cancer screening 06/21/2015  . Status post below knee amputation of right lower extremity (HCC) 06/07/2015  . Diabetic polyneuropathy associated with type 2 diabetes mellitus (HCC)  12/09/2014  . Diabetes mellitus with carpal tunnel syndrome (HCC) 12/09/2014  . DJD (degenerative joint disease) of knee 07/21/2014  . Osteoarthritis, hip, bilateral 05/25/2014  . Pulmonary emboli (HCC) 02/08/2014  . DVT (deep venous thrombosis) (HCC) 02/08/2014  . Onychomycosis 10/14/2013  . Pure hypercholesterolemia 09/25/2013  . Diabetic foot ulcer (HCC) 09/15/2013  . Sebaceous cyst 08/16/2011  . Poorly controlled type 2 diabetes mellitus with complication (HCC) 09/15/1997   Past Medical History:  Diagnosis Date  . Arthritis    bilateral hips  . Deep vein  thrombosis (DVT) (HCC)   . Diabetes mellitus    type II  . 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 DJD (degenerative joint disease)   . Pulmonary emboli (HCC) 02/08/2014   Date of diagnosis February 08 2014, on chest CTA Hospitalized for 3 days Had some symptoms of shortness of breath, and chest pain With intercurrent DVT of the left LE. Duration of anticoagulation: 8 months. End date 10/11/2014.  Anticoagulant: Lovenox 120 units daily Switched to Eliquis on 05/25/2014 per patient preference   . Pulmonary embolism (HCC)   . Sebaceous cyst    on back of neck    Family History  Problem Relation Age of Onset  . Cancer Mother        breast, colon, liver  . Diabetes Father     Past Surgical History:  Procedure Laterality Date  . AMPUTATION Right 06/03/2015   Procedure: Right Below Knee Amputation;  Surgeon: Nadara MustardMarcus Layliana Devins V, MD;  [facility];  Service: Orthopedics;  Laterality: Right;  . CLOSED REDUCTION WITH HUMER PIN INSERTION  1974   left hip  . HARDWARE REMOVAL Left 07/21/2014   Procedure: HARDWARE REMOVAL;  Surgeon: Loreta Aveaniel F Murphy, MD;  [facility];  Service: Orthopedics;  Laterality: Left;  . JOINT REPLACEMENT  2006 (approx)   right hip replaced  . ROTATOR CUFF REPAIR  2005 (approx)   right   . 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 TOTAL HIP ARTHROPLASTY Left 07/21/2014   Procedure: TOTAL HIP ARTHROPLASTY ANTERIOR APPROACH;  Surgeon: Loreta Aveaniel F Murphy, MD;  [facility];  Service: Orthopedics;  Laterality: Left;   Social History   Occupational History  . Not on file  Tobacco Use  . Smoking status: Never Smoker  . Smokeless tobacco: Never Used  Substance and Sexual Activity  . Alcohol use: Yes    Alcohol/week: 0.0 standard drinks    Comment: occasional  . Drug use: No  . Sexual activity: Never

## 2018-09-10 ENCOUNTER — Telehealth: Payer: Self-pay | Admitting: Endocrinology

## 2018-09-10 NOTE — Telephone Encounter (Signed)
[employer] would like a call back to complete a PA for the medication below  They have clinical questions that need to be completed with them     PHONE- [phone] REF- [phone]       BYDUREON 2 MG PEN

## 2018-09-10 NOTE — Telephone Encounter (Signed)
No alternative to [employer]

## 2018-09-10 NOTE — Telephone Encounter (Signed)
[employer] ph# [phone] called re: PA for Novalog 100 units. Please call them and let them know if you want them to fax the form or if you prefer phone call. Fax# [phone]. Reference# [id number]

## 2018-09-10 NOTE — Telephone Encounter (Signed)
Called number listed below and gave them reference number. They stated that they needed the ICD10 code and this was provided to them. They also requested a list of medications that the patient has tried and failed.  I informed the person I spoke with that typically, a  PA is not done for Novolog because it is usually changed to the equivalent Humalog. The representative stated that because the patient initiated the PA, she was unable to close it without completing it, and for this reason, the information was provided to her instead of changing it to Humalog.

## 2018-09-10 NOTE — Telephone Encounter (Signed)
Do you have another alternative to the Bydureon before these forms are filled out for Tier excpetions?

## 2018-09-10 NOTE — Telephone Encounter (Signed)
Patient called re: Gregory May is sending tier reduction  forms for the following medications:Bydureon, Novalog & Vigo 20 Disposable Pump Kit. Patient is out of the Bydureon due to the expense of the medication. Please call patient at ph# [phone] to advise if there are other more affordable options. Patient also has form that he may need to bring in to have Dr. Dwyane Dee fill out. Patient is trying to find ways to be able to afford his medications.

## 2018-09-10 NOTE — Telephone Encounter (Signed)
PA will be completed. 

## 2018-09-11 ENCOUNTER — Telehealth: Payer: Self-pay

## 2018-09-11 NOTE — Telephone Encounter (Signed)
PA for novolog was denied by [facility]

## 2018-09-18 ENCOUNTER — Telehealth: Payer: Self-pay

## 2018-09-18 NOTE — Telephone Encounter (Signed)
Patient calling to see if we have received a tier reduction form from [facility] and if so what is the status of this paperwork

## 2018-09-18 NOTE — Telephone Encounter (Signed)
Form was received, filled out, signed by MD, and faxed today.

## 2018-10-06 ENCOUNTER — Ambulatory Visit (INDEPENDENT_AMBULATORY_CARE_PROVIDER_SITE_OTHER): Payer: Medicare HMO | Admitting: Orthopedic Surgery

## 2018-10-06 ENCOUNTER — Encounter (INDEPENDENT_AMBULATORY_CARE_PROVIDER_SITE_OTHER): Payer: Self-pay | Admitting: Orthopedic Surgery

## 2018-10-06 VITALS — Ht 72.0 in | Wt 210.4 lb

## 2018-10-06 DIAGNOSIS — E1142 Type 2 diabetes mellitus with diabetic polyneuropathy: Secondary | ICD-10-CM | POA: Diagnosis not present

## 2018-10-06 DIAGNOSIS — Z89511 Acquired absence of right leg below knee: Secondary | ICD-10-CM

## 2018-10-06 DIAGNOSIS — I87322 Chronic venous hypertension (idiopathic) with inflammation of left lower extremity: Secondary | ICD-10-CM

## 2018-10-07 ENCOUNTER — Encounter (INDEPENDENT_AMBULATORY_CARE_PROVIDER_SITE_OTHER): Payer: Self-pay | Admitting: Orthopedic Surgery

## 2018-10-07 DIAGNOSIS — I87322 Chronic venous hypertension (idiopathic) with inflammation of left lower extremity: Secondary | ICD-10-CM | POA: Insufficient documentation

## 2018-10-07 NOTE — Progress Notes (Signed)
Office Visit Note   Patient: Gregory May           Date of Birth: 01-01-1961           MRN: [id number] Visit Date: 10/06/2018              Requested by: Lanelle Bal, MD [address] [place], [place] [address] PCP: Lanelle Bal, MD  Chief Complaint  Patient presents with  . Left Foot - Follow-up, Pain  . Right Leg - Follow-up    BKA      HPI: Patient is a 58 year old gentleman who is 3 years status post right transtibial amputation.  Patient presents stating that the right leg ulcer has healed.  Patient is concerned with ulceration to the left foot left heel and increased swelling of the left lower extremity.  Assessment & Plan: Visit Diagnoses:  1. Acquired absence of right leg below knee (HCC)   2. Diabetic polyneuropathy associated with type 2 diabetes mellitus (HCC)   3. 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  Recommended a knee-high medical compression stocking initially to be worn around-the-clock.  Follow-Up Instructions: Return in about 3 months (around 01/04/2019).   Ortho Exam  Patient is alert, oriented, no adenopathy, well-dressed, normal affect, normal respiratory effort. Examination patient has a well-healed residual limb on the right.  His dermatitis has resolved secondary to him wearing the Vive compression stocking against the skin.  Examination the left foot he has dry cracked skin from fungal infection there is a very superficial blister over the heel this was debrided with a 10 blade knife there is no open wounds.  He has pitting edema with brawny skin color change left leg with venous insufficiency.  His left calf measures 39 cm in circumference.  Imaging: No results found. No images are attached to the encounter.  Labs: Lab Results  Component Value Date   HGBA1C 6.7 (H)  07/18/2018   HGBA1C 6.4 04/15/2018   HGBA1C 6.5 01/09/2018   ESRSEDRATE 56 (H) 06/01/2015   REPTSTATUS 06/07/2015 FINAL 06/01/2015   CULT  06/01/2015    NO GROWTH 5 DAYS Performed at [facility]      Lab Results  Component Value Date   ALBUMIN 4.1 07/18/2018   ALBUMIN 4.0 01/09/2018   ALBUMIN 4.0 10/22/2017    Body mass index is 28.54 kg/m.  Orders:  No orders of the defined types were placed in this encounter.  No orders of the defined types were placed in this encounter.    Procedures: No procedures performed  Clinical Data: No additional findings.  ROS:  All other systems negative, except as noted in the HPI. Review of Systems  Objective: Vital Signs: Ht 6' (1.829 m)   Wt 210 lb 6.4 oz (95.4 kg)   BMI 28.54 kg/m   Specialty Comments:  No specialty comments available.  PMFS History: Patient Active Problem List   Diagnosis Date Noted  . 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 Essential hypertension 06/19/2018  . Peripheral neuropathy 12/19/2017  . Non-pressure chronic ulcer of right calf, limited to breakdown of skin (HCC) 04/17/2017  . Acquired absence of right leg below knee (HCC) 04/17/2017  . Carpal tunnel syndrome 03/18/2017  . Colon cancer screening 06/21/2015  . Status post below knee amputation of right lower  extremity (HCC) 06/07/2015  . Diabetic polyneuropathy associated with type 2 diabetes mellitus (HCC) 12/09/2014  . Diabetes mellitus with carpal tunnel syndrome (HCC) 12/09/2014  . DJD (degenerative joint disease) of knee 07/21/2014  . Osteoarthritis, hip, bilateral 05/25/2014  . Pulmonary emboli (HCC) 02/08/2014  . DVT (deep venous thrombosis) (HCC) 02/08/2014  . Onychomycosis 10/14/2013  . Pure hypercholesterolemia 09/25/2013  . Diabetic foot ulcer (HCC) 09/15/2013  . Sebaceous cyst 08/16/2011  . Poorly controlled type 2 diabetes mellitus with complication (HCC)  09/15/1997   Past Medical History:  Diagnosis Date  . Arthritis    bilateral hips  . Deep vein thrombosis (DVT) (HCC)   . Diabetes mellitus    type II  . 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 DJD (degenerative joint disease)   . Pulmonary emboli (HCC) 02/08/2014   Date of diagnosis February 08 2014, on chest CTA Hospitalized for 3 days Had some symptoms of shortness of breath, and chest pain With intercurrent DVT of the left LE. Duration of anticoagulation: 8 months. End date 10/11/2014.  Anticoagulant: Lovenox 120 units daily Switched to Eliquis on 05/25/2014 per patient preference   . Pulmonary embolism (HCC)   . Sebaceous cyst    on back of neck    Family History  Problem Relation Age of Onset  . Cancer Mother        breast, colon, liver  . Diabetes Father     Past Surgical History:  Procedure Laterality Date  . AMPUTATION Right 06/03/2015   Procedure: Right Below Knee Amputation;  Surgeon: Nadara Mustard, MD;  Location: [facility];  Service: Orthopedics;  Laterality: Right;  . CLOSED REDUCTION WITH HUMER PIN INSERTION  1974   left hip  . HARDWARE REMOVAL Left 07/21/2014   Procedure: HARDWARE REMOVAL;  Surgeon: Loreta Ave, MD;  Location: [facility] OR;  Service: Orthopedics;  Laterality: Left;  . JOINT REPLACEMENT  2006 (approx)   right hip replaced  . ROTATOR CUFF REPAIR  2005 (approx)   right   . 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 TOTAL HIP ARTHROPLASTY Left 07/21/2014   Procedure: TOTAL HIP ARTHROPLASTY ANTERIOR APPROACH;  Surgeon: Loreta Ave, MD;  Location: [facility] OR;  Service: Orthopedics;  Laterality: Left;   Social History   Occupational History  . Not on file  Tobacco Use  . Smoking status: Never Smoker  . Smokeless tobacco: Never Used  Substance and Sexual Activity  . Alcohol use: Yes    Alcohol/week: 0.0 standard drinks    Comment: occasional  . Drug use: No  . Sexual activity: Never

## 2018-10-20 ENCOUNTER — Other Ambulatory Visit (INDEPENDENT_AMBULATORY_CARE_PROVIDER_SITE_OTHER): Payer: Medicare HMO

## 2018-10-20 ENCOUNTER — Other Ambulatory Visit: Payer: Self-pay

## 2018-10-20 DIAGNOSIS — Z794 Long term (current) use of insulin: Secondary | ICD-10-CM | POA: Diagnosis not present

## 2018-10-20 DIAGNOSIS — E1165 Type 2 diabetes mellitus with hyperglycemia: Secondary | ICD-10-CM | POA: Diagnosis not present

## 2018-10-20 DIAGNOSIS — E7800 Pure hypercholesterolemia, unspecified: Secondary | ICD-10-CM

## 2018-10-20 LAB — COMPREHENSIVE METABOLIC PANEL
ALT: 59 U/L — ABNORMAL HIGH (ref 0–53)
AST: 51 U/L — ABNORMAL HIGH (ref 0–37)
Albumin: 4.2 g/dL (ref 3.5–5.2)
Alkaline Phosphatase: 49 U/L (ref 39–117)
BUN: 11 mg/dL (ref 6–23)
CO2: 29 mEq/L (ref 19–32)
Calcium: 9.3 mg/dL (ref 8.4–10.5)
Chloride: 98 mEq/L (ref 96–112)
Creatinine, Ser: 0.94 mg/dL (ref 0.40–1.50)
GFR: 83.01 mL/min (ref >60.00)
Glucose, Bld: 134 mg/dL — ABNORMAL HIGH (ref 70–99)
Potassium: 4.1 mEq/L (ref 3.5–5.1)
Sodium: 135 mEq/L (ref 135–145)
Total Bilirubin: 0.4 mg/dL (ref 0.2–1.2)
Total Protein: 7.5 g/dL (ref 6.0–8.3)

## 2018-10-20 LAB — LIPID PANEL
Cholesterol: 161 mg/dL (ref 0–200)
HDL: 47.7 mg/dL (ref >39.00)
LDL Cholesterol: 98 mg/dL (ref 0–99)
NonHDL: 113.05
Total CHOL/HDL Ratio: 3
Triglycerides: 73 mg/dL (ref 0.0–149.0)
VLDL: 14.6 mg/dL (ref 0.0–40.0)

## 2018-10-20 LAB — HEMOGLOBIN A1C: Hgb A1c MFr Bld: 7 % — ABNORMAL HIGH (ref 4.6–6.5)

## 2018-10-23 ENCOUNTER — Other Ambulatory Visit: Payer: Self-pay

## 2018-10-23 ENCOUNTER — Encounter: Payer: Self-pay | Admitting: Endocrinology

## 2018-10-23 ENCOUNTER — Ambulatory Visit (INDEPENDENT_AMBULATORY_CARE_PROVIDER_SITE_OTHER): Payer: Medicare HMO | Admitting: Endocrinology

## 2018-10-23 VITALS — BP 132/82 | HR 85 | Ht 72.0 in | Wt 213.0 lb

## 2018-10-23 DIAGNOSIS — E1165 Type 2 diabetes mellitus with hyperglycemia: Secondary | ICD-10-CM

## 2018-10-23 DIAGNOSIS — E7800 Pure hypercholesterolemia, unspecified: Secondary | ICD-10-CM

## 2018-10-23 DIAGNOSIS — Z794 Long term (current) use of insulin: Secondary | ICD-10-CM

## 2018-10-23 DIAGNOSIS — R748 Abnormal levels of other serum enzymes: Secondary | ICD-10-CM | POA: Diagnosis not present

## 2018-10-23 DIAGNOSIS — I10 Essential (primary) hypertension: Secondary | ICD-10-CM

## 2018-10-23 NOTE — Patient Instructions (Addendum)
Check blood sugars on waking up 3  days a week  Also check blood sugars about 2 hours after meals and do this after different meals by rotation  Recommended blood sugar levels on waking up are 90-130 and about 2 hours after meal is 130-160  Please bring your blood sugar monitor to each visit, thank you  Watch sweets  Stay on 40 Pravastatin

## 2018-10-23 NOTE — Progress Notes (Signed)
Gregory May 58 y.o.           Reason for Appointment: Diabetes follow-up   History of Present Illness   Diagnosis: Type 2 DIABETES MELITUS, date of diagnosis:  1999     Previous history: He has previously been treated with metformin and Victoza and subsequently mealtime insulin added to control postprandial hyperglycemia Overall he has been  relatively noncompliant with his diet, medications, monitoring and followup Previously would not take Victoza regularly because of cost. Has not been taking any medications for the last year and a half because of commitments to family and cost A1c had increased to 12.3% and hyperglycemia discovered when he was hospitalized for foot ulcer. Also lost 20 pounds because of hyperglycemia   For better control, compliance and inconvenience he was switched to the V-go pump on 02/21/16  Recent history:   Insulin regimen: V-go 20 unit basal, mealtime boluses 12-16 breakfast ---16-20 units dinner  Non-insulin hypoglycemic drugs: Metformin ER 1500 mg daily, was on Bydureon 2 mg weekly         A1c  is now 7 and gradually increasing     Management, blood sugar patterns and problems identified.  His average blood sugar at home was only 122  This is despite not taking Bydureon for about 2 months because of cost  Again he is checking blood sugars mostly before meals and usually not after his main meal in the evening  However blood sugars at bedtime are none not documented to be high recently  He does have several high readings in the morning and I think this is from eating cookies during the night without any boluses usually  Has gained a little weight also  He is usually fairly consistent with changing his pump at the same time every day and bolusing before meals  Occasionally will be a day or 2 late with taking his Bydureon  He says he is eating sometimes more sweets like cookies and this will make his sugar go up including late at night   Otherwise he has fairly good blood sugars fasting and in the lab before lunch  Usually eating 2 meals a day and generally using of his boluses for these meals and snacks  No hypoglycemic symptoms at any time with lowest blood sugar 68 late afternoon  Because of his amputation history he is not able to do much exercise       Side effects from medications: None       Monitors blood glucose: Once a day .  Glucometer: One Touch.          Blood Glucose readings:    PRE-MEAL Fasting Lunch Dinner Bedtime Overall  Glucose range:  112-172  88-157  85-127   Mean/median:  143   106  119     Meals:  usually 2 meals per day usually.  breakfast at 12 noon, evening meal 7-9 pm, variable snacks late at night    Physical activity: exercise: Minimal               Weight control:   Wt Readings from Last 3 Encounters:  10/23/18 213 lb (96.6 kg)  10/06/18 210 lb 6.4 oz (95.4 kg)  07/22/18 210 lb 6.4 oz (95.4 kg)          Diabetes labs:  Lab Results  Component Value Date   HGBA1C 7.0 (H) 10/20/2018   HGBA1C 6.7 (H) 07/18/2018   HGBA1C 6.4 04/15/2018   Lab Results  Component  Value Date   MICROALBUR 1.5 01/09/2018   LDLCALC 98 10/20/2018   CREATININE 0.94 10/20/2018    Lab Results  Component Value Date   FRUCTOSAMINE 243 03/07/2017   FRUCTOSAMINE 274 10/30/2016   FRUCTOSAMINE 260 04/13/2016     Other active problems: See review of systems    Lab on 10/20/2018  Component Date Value Ref Range Status  . Cholesterol 10/20/2018 161  0 - 200 mg/dL Final   ATP III Classification       Desirable:  < 200 mg/dL               Borderline High:  200 - 239 mg/dL          High:  > = 240 mg/dL  . Triglycerides 10/20/2018 73.0  0.0 - 149.0 mg/dL Final   Normal:  <150 mg/dLBorderline High:  150 - 199 mg/dL  . HDL 10/20/2018 47.70  >39.00 mg/dL Final  . VLDL 10/20/2018 14.6  0.0 - 40.0 mg/dL Final  . LDL Cholesterol 10/20/2018 98  0 - 99 mg/dL Final  . Total CHOL/HDL Ratio 10/20/2018 3    Final                  Men          Women1/2 Average Risk     3.4          3.3Average Risk          5.0          4.42X Average Risk          9.6          7.13X Average Risk          15.0          11.0                      . NonHDL 10/20/2018 113.05   Final   NOTE:  Non-HDL goal should be 30 mg/dL higher than patient's LDL goal (i.e. LDL goal of < 70 mg/dL, would have non-HDL goal of < 100 mg/dL)  . Sodium 10/20/2018 135  135 - 145 mEq/L Final  . Potassium 10/20/2018 4.1  3.5 - 5.1 mEq/L Final  . Chloride 10/20/2018 98  96 - 112 mEq/L Final  . CO2 10/20/2018 29  19 - 32 mEq/L Final  . Glucose, Bld 10/20/2018 134* 70 - 99 mg/dL Final  . BUN 10/20/2018 11  6 - 23 mg/dL Final  . Creatinine, Ser 10/20/2018 0.94  0.40 - 1.50 mg/dL Final  . Total Bilirubin 10/20/2018 0.4  0.2 - 1.2 mg/dL Final  . Alkaline Phosphatase 10/20/2018 49  39 - 117 U/L Final  . AST 10/20/2018 51* 0 - 37 U/L Final  . ALT 10/20/2018 59* 0 - 53 U/L Final  . Total Protein 10/20/2018 7.5  6.0 - 8.3 g/dL Final  . Albumin 10/20/2018 4.2  3.5 - 5.2 g/dL Final  . Calcium 10/20/2018 9.3  8.4 - 10.5 mg/dL Final  . GFR 10/20/2018 83.01  >60.00 mL/min Final  . Hgb A1c MFr Bld 10/20/2018 7.0* 4.6 - 6.5 % Final   Glycemic Control Guidelines for People with Diabetes:Non Diabetic:  <6%Goal of Therapy: <7%Additional Action Suggested:  >8%      Allergies as of 10/23/2018   No Known Allergies     Medication List       Accurate as of October 23, 2018  3:48 PM. Always use your most recent med  list.        Accu-Chek FastClix Lancets Misc 1 each by Does not apply route daily.   Accu-Chek Guide Me w/Device Kit 1 each by Does not apply route daily.   acetaminophen 325 MG tablet Commonly known as:  TYLENOL Take 650 mg by mouth every 6 (six) hours as needed.   cholecalciferol 1000 units tablet Commonly known as:  VITAMIN D Take 1,000 Units by mouth daily.   gabapentin 300 MG capsule Commonly known as:  NEURONTIN Take 1 capsule  (300 mg total) by mouth 3 (three) times daily.   glucose blood test strip Commonly known as:  Accu-Chek Guide 1 each by Other route as needed for other. Use as instructed to check blood sugar twice daily.   insulin aspart 100 UNIT/ML injection Commonly known as:  NovoLOG INJECT 56 UNITS SUBCUTANEOUSLY ONCE DAILY IN V-GO PUMP   lisinopril 5 MG tablet Commonly known as:  PRINIVIL,ZESTRIL Take 1 tablet (5 mg total) by mouth daily.   metFORMIN 750 MG 24 hr tablet Commonly known as:  GLUCOPHAGE-XR TAKE 2 TABLETS BY MOUTH ONCE DAILY   pravastatin 80 MG tablet Commonly known as:  PRAVACHOL Take 1 tablet (80 mg total) by mouth daily.   V-Go 20 Kit 1 each by Does not apply route daily. Apply Vgo20 pump to arm once daily.       Allergies: No Known Allergies  Past Medical History:  Diagnosis Date  . Arthritis    bilateral hips  . Deep vein thrombosis (DVT) ([place])   . Diabetes mellitus    type II  . 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 DJD (degenerative joint disease)   . Pulmonary emboli ([place]) 02/08/2014   Date of diagnosis February 08 2014, on chest CTA Hospitalized for 3 days Had some symptoms of shortness of breath, and chest pain With intercurrent DVT of the left LE. Duration of anticoagulation: 8 months. End date 10/11/2014.  Anticoagulant: Lovenox 120 units daily Switched to Eliquis on 05/25/2014 per patient preference   . Pulmonary embolism ([place])   . Sebaceous cyst    on back of neck    Past Surgical History:  Procedure Laterality Date  . AMPUTATION Right 06/03/2015   Procedure: Right Below Knee Amputation;  Surgeon: [name], MD;  Location: [place];  Service: Orthopedics;  Laterality: Right;  . CLOSED REDUCTION WITH HUMER PIN INSERTION  1974   left hip  . HARDWARE REMOVAL Left 07/21/2014   Procedure: HARDWARE REMOVAL;  Surgeon: Ninetta Lights, MD;  Location: [place];  Service: Orthopedics;  Laterality: Left;  . JOINT REPLACEMENT  2006 (approx)   right hip  replaced  . ROTATOR CUFF REPAIR  2005 (approx)   right   . 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 TOTAL HIP ARTHROPLASTY Left 07/21/2014   Procedure: TOTAL HIP ARTHROPLASTY ANTERIOR APPROACH;  Surgeon: Ninetta Lights, MD;  Location: [place];  Service: Orthopedics;  Laterality: Left;    Family History  Problem Relation Age of Onset  . Cancer Mother        breast, colon, liver  . Diabetes Father     Social History:  reports that he has never smoked. He has never used smokeless tobacco. He reports current alcohol use. He reports that he does not use drugs.  Review of Systems:   Lipids: He is on Pravachol  with previously good control of LDL Now LDL is under 100  He now says that he was not taking his pravastatin regularly previously and now is taking his 40 mg He was advised to use 80 mg daily but he is only taking half a tablet   Lab Results  Component Value Date   CHOL 161 10/20/2018   HDL 47.70 10/20/2018   LDLCALC 98 10/20/2018   LDLDIRECT 91.0 07/11/2015   TRIG 73.0 10/20/2018   CHOLHDL 3 10/20/2018    He has history of very mild hypertension,  blood pressure usually higher when he first comes in His PCP has started him on lisinopril 10 mg, which was causing lower blood pressure readings and now on 27m   BP Readings from Last 3 Encounters:  10/23/18 132/82  07/22/18 102/68  06/18/18 137/83        Examination:   BP 132/82 (BP Location: Left Arm, Patient Position: Sitting, Cuff Size: Normal)   Pulse 85   Ht 6' (1.829 m)   Wt 213 lb (96.6 kg)   SpO2 97%   BMI 28.89 kg/m   Body mass index is 28.89 kg/m.   ASSESSMENT/ PLAN:     Diabetes type 2:  See history of present illness for detailed discussion of current diabetes management, blood sugar patterns and problems identified  Blood sugars have been overall well controlled  A1c is slightly higher at 7  This is despite not taking Bydureon He may be having somewhat higher readings in the  morning from late night snacks and no consistently high readings documented weight is only slightly higher Not clear if he does need Bydureon although he may be able to keep his portion better controlled when taking this and need less mealtime insulin He will look into the patient assistance program for this Alternatively may consider Jardiance  In the meantime discussed needing to check more readings 2 hours after meals To cut back on sweets and if he does eat snacks at night to bolus for them appropriately Increase physical activity as tolerated Stay on metformin    LIPIDS: LDL is just below 100 with taking pravastatin regularly  However not clear if this is causing higher liver functions which he has had from time to time  He needs to discuss his liver function abnormality with PCP  Mild hypertension: Well controlled without orthostatic symptoms now with only 5 mg lisinopril   Total visit time for evaluation and management of multiple problems and counseling =25 minutes   There are no Patient Instructions on file for this visit.    AElayne Snare3/07/2019, 3:48 PM   Note: This office note was prepared with Dragon voice recognition system technology. Any transcriptional errors that result from this process are unintentional.

## 2018-12-08 ENCOUNTER — Telehealth: Payer: Self-pay | Admitting: Endocrinology

## 2018-12-08 ENCOUNTER — Other Ambulatory Visit: Payer: Self-pay | Admitting: Endocrinology

## 2018-12-08 NOTE — Telephone Encounter (Signed)
Called and left voicemail for pt to return call so he can be informed of this.

## 2018-12-08 NOTE — Telephone Encounter (Signed)
We discussed on his last visit he can leave it off unless he is starting to gain weight

## 2018-12-08 NOTE — Telephone Encounter (Signed)
Patient has not took there bydureon since January and states there sugars have been good and A1C is good but would like to know if they can stay off of it?  Please Advise, Thanks

## 2018-12-08 NOTE — Telephone Encounter (Signed)
Please clarify, I cannot understand the verbage

## 2018-12-09 ENCOUNTER — Other Ambulatory Visit: Payer: Self-pay | Admitting: Internal Medicine

## 2018-12-09 DIAGNOSIS — E1149 Type 2 diabetes mellitus with other diabetic neurological complication: Secondary | ICD-10-CM

## 2018-12-09 DIAGNOSIS — G5600 Carpal tunnel syndrome, unspecified upper limb: Principal | ICD-10-CM

## 2018-12-10 ENCOUNTER — Encounter: Payer: Medicare HMO | Admitting: Internal Medicine

## 2018-12-10 ENCOUNTER — Other Ambulatory Visit: Payer: Self-pay

## 2018-12-10 NOTE — Progress Notes (Signed)
Patient did not answer the phone x 2 attempts >30min apart.

## 2019-01-06 ENCOUNTER — Encounter: Payer: Self-pay | Admitting: Orthopedic Surgery

## 2019-01-06 ENCOUNTER — Ambulatory Visit (INDEPENDENT_AMBULATORY_CARE_PROVIDER_SITE_OTHER): Payer: Medicare HMO | Admitting: Orthopedic Surgery

## 2019-01-06 ENCOUNTER — Other Ambulatory Visit: Payer: Self-pay

## 2019-01-06 VITALS — Ht 72.0 in | Wt 213.0 lb

## 2019-01-06 DIAGNOSIS — L97521 Non-pressure chronic ulcer of other part of left foot limited to breakdown of skin: Secondary | ICD-10-CM

## 2019-01-06 DIAGNOSIS — Z89511 Acquired absence of right leg below knee: Secondary | ICD-10-CM

## 2019-01-06 DIAGNOSIS — L97421 Non-pressure chronic ulcer of left heel and midfoot limited to breakdown of skin: Secondary | ICD-10-CM | POA: Diagnosis not present

## 2019-01-06 DIAGNOSIS — E1142 Type 2 diabetes mellitus with diabetic polyneuropathy: Secondary | ICD-10-CM | POA: Diagnosis not present

## 2019-01-06 NOTE — Progress Notes (Signed)
Office Visit Note   Patient: Gregory May           Date of Birth: 07/20/1961           MRN: [id number] Visit Date: 01/06/2019              Requested by: Harbrecht, Lawrence, MD [address] [place], [place] [address] PCP: Harbrecht, Lawrence, MD  Chief Complaint  Patient presents with  . Left Leg - Follow-up      HPI: The patient is a 58 yo gentleman who is seen for follow up of his right below the knee amputation from 5 years ago and for a ulcer over the left foot heel, and 1st and 4th metatarsal head area. He reports some tenderness over the ulcer/callused areas. He reports no problems with his right transtibial amputation site.   Assessment & Plan: Visit Diagnoses:  1. Acquired absence of right leg below knee (HCC)   2. Diabetic polyneuropathy associated with type 2 diabetes mellitus (HCC)   3. Ulcer of left heel, limited to breakdown of skin (HCC)   4. Ulcer of left foot, limited to breakdown of skin (HCC)     Plan: After informed consent, the left heel, left 1st MT and 4th Mt ulcers/calluses were debrided to healthy viable tissue with a #10 blade knife and the patient tolerated this well. 2 toe nails were also trimmed as the patient is unable to safely trim his nails due to severe neuropathy.  Recommend continue to wash left foot daily with soap and water and then apply medical compression sock around the clock and elevate and off load the foot at much as possible.   Follow-Up Instructions: Return in about 4 weeks (around 02/03/2019).   Ortho Exam  Patient is alert, oriented, no adenopathy, well-dressed, normal affect, normal respiratory effort. The right transtibial amputation site is well healed and well consolidated and without signs of breakdown or pressure areas. There is significant volume loss.  The left foot was debrided with a #10 blade knife over the left heel, left 1st metatarsal head and 4th metatarsal head over the plantar surface to healthy viable tissue  . Silver nitrate was used for hemostasis.  The87m l166m1216KentucDarleen [id number] KentuckShary31mn 161(9KentuckDarleen Cr259818-3662383mm-1612KentuDarleen Cr(85440(218)498-2959m7Kentuck52mSh1612KentuDarleen Cr(91731226 250 13(703)123-6230818-529m1KentuckShary83mn 161(3KentDarleen Cr9330m1KentuckShary85mn 161(3KentuckDarleen Cr(60740(250)674-5163m1KentuckSharyn Ke39mnt161(8KentDarleen Cr508328m7KentuckShary57mn 1619KentuDarleen Cr22858870687145038838290950-26m7KentuckShary75mn 1613KentuDarleen Cr(9808837m(KentuckShary91mn 161(9KenDarleen Cr80216(308) 494-14(640)125-2774814-42Science Applicat72mio161(4KentuDarleen Cr(7075253170m1KentuckShary55mn 1615KentuDarleen Cr(95968940 720 4729m(KentuckShary8mn 1613KentuDarleen Cr(80475316-758-96228-110-62m991614KentDarleen Cr41932(408) 074-03(604)763-4547276-0485Di66mgKentuckShary34mn 161(20KentuDarleen Cr8661623-90m36161(9KentuDarleen Cr93058857 422 18321-090-2729437-53m531617KentucDarlee33mnKentuckSharyn [place]5Karrie Meres6-30380-4429m2-1612KentDarleen Cr40576(415) 816-44708-733-9578827m491619KentuDarle48men161(6KentucDarleen Cr41820253-069-44(505) 43m1KentuckShary51mn 1616KentuckDarleen Cr(90758(914)813-02586-339-6050618-65612m2KentuckShary58mn 1616KentuckDa9mrl161(7KentDarleen Cr(857)64(301) 430-62747-496-5544320-29685m8KentuckShary67mn 16KentucDarleen Cr(47m5Kent52muc161(4KenDarleen 70mKentuckShary26mKenDarleen Cr36144(939)671-95641-801-2060m041619KenDarleen Cr(22073250-121-04774-343-5117378-9958[facility] L40m46161(2KentuDarleen Cr92939(562) 860-13225-574-4819m571613KentuDarleen C16mrKentuckShary63mn 1619KentuckDarleen Cr893512 626 46(52m3KentuckSharyn [place]1Karrie Meres156386PalmettScience ApplicatiTajikKaF587Airpo548-660-47Georgetta Haberontclair Hospi70mtaBluford Flonnie OvermThe University Of [facility] MedicHi g debridement.   Imaging: No results found. No images are attached to the encounter.  Labs: Lab Results  Component Value Date   HGBA1C 7.0 (H) 10/20/2018   HGBA1C 6.7 (H) 07/18/2018   HGBA1C 6.4 04/15/2018   ESRSEDRATE 56 (H) 06/01/2015   REPTSTATUS 06/07/2015 FINAL 06/01/2015   CULT  06/01/2015    NO GROWTH 5 DAYS Performed at [facility]      Lab Results  Component Value Date   ALBUMIN 4.2 10/20/2018   ALBUMIN 4.1 07/18/2018   ALBUMIN 4.0 01/09/2018    Body mass index is 28.89 kg/m.  Orders:  No orders of the defined types were placed in this encounter.  No orders of the defined types were placed in this encounter.    Procedures: No procedures performed  Clinical Data: No additional findings.  ROS:  All other systems negative, except as noted in the HPI. Review of Systems  Objective: Vital Signs: Ht 6' (1.829 m)   Wt 213 lb (96.6 kg)   BMI 28.89 kg/m   Specialty Comments:  No specialty comments available.  PMFS History: Patient Active Problem List   Diagnosis Date Noted  . 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 Essential hypertension 06/19/2018  . Peripheral neuropathy 12/19/2017  . Non-pressure chronic ulcer of right calf, limited to breakdown of skin (HCC) 04/17/2017  . Acquired absence of right leg below knee (HCC) 04/17/2017  . Carpal tunnel syndrome 03/18/2017  . Colon cancer screening 06/21/2015  . Status post below knee amputation of right lower extremity (HCC) 06/07/2015  . Diabetic polyneuropathy associated with type 2 diabetes mellitus (HCC) 12/09/2014  . Diabetes mellitus with carpal tunnel syndrome (HCC) 12/09/2014  . DJD  (degenerative joint disease) of knee 07/21/2014  . Osteoarthritis, hip, bilateral 05/25/2014  . Pulmonary emboli (HCC) 02/08/2014  . DVT (deep venous thrombosis) (HCC) 02/08/2014  . Onychomycosis 10/14/2013  . Pure hypercholesterolemia 09/25/2013  . Diabetic foot ulcer (HCC) 09/15/2013  . Sebaceous cyst 08/16/2011  . Poorly controlled type 2 diabetes mellitus with complication (HCC) 09/15/1997   Past Medical History:  Diagnosis Date  . Arthritis    bilateral hips  . Deep vein thrombosis (DVT) (HCC)   . Diabetes mellitus    type II  . 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 DJD (degenerative joint disease)   . Pulmonary emboli (HCC) 02/08/2014   Date of diagnosis February 08 2014, on chest CTA Hospitalized for 3 days Had some symptoms of shortness of breath, and chest pain With intercurrent DVT of the left LE. Duration of anticoagulation: 8 months. End date 10/11/2014.  Anticoagulant: Lovenox 120 units daily Switched to Eliquis on 05/25/2014 per patient preference   . Pulmonary embolism (HCC)   . Sebaceous cyst    on back of neck    Family History  Problem Relation Age of Onset  . Cancer Mother        breast, colon, liver  . Diabetes Father     Past Surgical History:  Procedure Laterality Date  . AMPUTATION Right 06/03/2015   Procedure: Right Below Knee Amputation;  Surgeon: Nadara Mustard, MD;  Location: [facility] OR;  Service: Orthopedics;  Laterality: Right;  . CLOSED REDUCTION WITH HUMER PIN INSERTION  1974   left hip  . HARDWARE REMOVAL Left 07/21/2014   Procedure: HARDWARE REMOVAL;  Surgeon: Loreta Ave, MD;  Location: [facility] OR;  Service: Orthopedics;  Laterality: Left;  . JOINT REPLACEMENT  2006 (approx)   right hip replaced  . ROTATOR CUFF REPAIR  2005 (approx)   right   . 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 TOTAL HIP ARTHROPLASTY Left 07/21/2014   Procedure: TOTAL HIP ARTHROPLASTY ANTERIOR APPROACH;  Surgeon: Loreta Ave, MD;  Location: [facility] OR;   Service: Orthopedics;  Laterality: Left;   Social History   Occupational History  . Not on file  Tobacco Use  . Smoking status: Never Smoker  . Smokeless tobacco: Never Used  Substance and Sexual Activity  . Alcohol use: Yes    Alcohol/week: 0.0 standard drinks    Comment: occasional  . Drug use: No  . Sexual activity: Never

## 2019-01-08 ENCOUNTER — Encounter: Payer: Self-pay | Admitting: Orthopedic Surgery

## 2019-01-10 ENCOUNTER — Telehealth: Payer: Self-pay

## 2019-01-10 ENCOUNTER — Telehealth: Payer: Self-pay | Admitting: Cardiology

## 2019-01-10 ENCOUNTER — Other Ambulatory Visit: Payer: Medicare HMO

## 2019-01-10 DIAGNOSIS — Z20822 Contact with and (suspected) exposure to covid-19: Secondary | ICD-10-CM

## 2019-01-10 NOTE — Telephone Encounter (Signed)
Telephone call from Patient inquiring about covid testing .   Paient states he will call back .If he desires testing.

## 2019-01-10 NOTE — Telephone Encounter (Signed)
sch pt at [facility] at 5:30 on 01/10/2019

## 2019-01-10 NOTE — Addendum Note (Signed)
Addended by: Wilford Corner on: 01/10/2019 04:21 PM   Modules accepted: Orders

## 2019-01-10 NOTE — Telephone Encounter (Signed)
Spoke with patient regarding recent office visit 5/26, patient was potential exposed to Covid during this visit,  He refused testing at this time, if he changes his mind gave him call center number [phone] if he changes his mind.

## 2019-01-12 LAB — NOVEL CORONAVIRUS, NAA: SARS-CoV-2, NAA: NOT DETECTED

## 2019-01-13 ENCOUNTER — Telehealth: Payer: Self-pay | Admitting: Internal Medicine

## 2019-01-13 ENCOUNTER — Telehealth: Payer: Self-pay

## 2019-01-13 NOTE — Telephone Encounter (Signed)
Requesting lab results. Please call back.  

## 2019-01-13 NOTE — Telephone Encounter (Signed)
Pt returned call for COvid results. Reviewed with pt.. results negative; Verbalizes understanding.

## 2019-01-16 NOTE — Telephone Encounter (Signed)
Negative covid result given to patient by Carlean Purl, RNKinnie Feil, RN, BSN

## 2019-01-20 ENCOUNTER — Other Ambulatory Visit (INDEPENDENT_AMBULATORY_CARE_PROVIDER_SITE_OTHER): Payer: Medicare HMO

## 2019-01-20 ENCOUNTER — Other Ambulatory Visit: Payer: Self-pay

## 2019-01-20 ENCOUNTER — Telehealth: Payer: Self-pay | Admitting: Internal Medicine

## 2019-01-20 DIAGNOSIS — Z794 Long term (current) use of insulin: Secondary | ICD-10-CM

## 2019-01-20 DIAGNOSIS — E1165 Type 2 diabetes mellitus with hyperglycemia: Secondary | ICD-10-CM | POA: Diagnosis not present

## 2019-01-20 DIAGNOSIS — E7800 Pure hypercholesterolemia, unspecified: Secondary | ICD-10-CM

## 2019-01-20 LAB — LIPID PANEL
Cholesterol: 170 mg/dL (ref 0–200)
HDL: 41.9 mg/dL (ref >39.00)
LDL Cholesterol: 101 mg/dL — ABNORMAL HIGH (ref 0–99)
NonHDL: 127.65
Total CHOL/HDL Ratio: 4
Triglycerides: 131 mg/dL (ref 0.0–149.0)
VLDL: 26.2 mg/dL (ref 0.0–40.0)

## 2019-01-20 LAB — COMPREHENSIVE METABOLIC PANEL
ALT: 65 U/L — ABNORMAL HIGH (ref 0–53)
AST: 55 U/L — ABNORMAL HIGH (ref 0–37)
Albumin: 3.9 g/dL (ref 3.5–5.2)
Alkaline Phosphatase: 85 U/L (ref 39–117)
BUN: 11 mg/dL (ref 6–23)
CO2: 29 mEq/L (ref 19–32)
Calcium: 9.3 mg/dL (ref 8.4–10.5)
Chloride: 100 mEq/L (ref 96–112)
Creatinine, Ser: 1.05 mg/dL (ref 0.40–1.50)
GFR: 72.99 mL/min (ref >60.00)
Glucose, Bld: 82 mg/dL (ref 70–99)
Potassium: 4 mEq/L (ref 3.5–5.1)
Sodium: 135 mEq/L (ref 135–145)
Total Bilirubin: 0.3 mg/dL (ref 0.2–1.2)
Total Protein: 7.2 g/dL (ref 6.0–8.3)

## 2019-01-20 LAB — MICROALBUMIN / CREATININE URINE RATIO
Creatinine,U: 77.7 mg/dL
Microalb Creat Ratio: 1.1 mg/g (ref 0.0–30.0)
Microalb, Ur: 0.9 mg/dL (ref 0.0–1.9)

## 2019-01-20 LAB — HEMOGLOBIN A1C: Hgb A1c MFr Bld: 7.7 % — ABNORMAL HIGH (ref 4.6–6.5)

## 2019-01-20 NOTE — Telephone Encounter (Signed)
Patient notified again of negative test results.

## 2019-01-20 NOTE — Telephone Encounter (Signed)
Pt said that he is calling about his COVID-19 results, they left him a message to call regarding his results; [phone]

## 2019-01-23 ENCOUNTER — Other Ambulatory Visit: Payer: Self-pay

## 2019-01-23 ENCOUNTER — Ambulatory Visit (INDEPENDENT_AMBULATORY_CARE_PROVIDER_SITE_OTHER): Payer: Medicare HMO | Admitting: Endocrinology

## 2019-01-23 ENCOUNTER — Encounter: Payer: Self-pay | Admitting: Endocrinology

## 2019-01-23 VITALS — BP 122/72 | HR 79 | Ht 72.0 in | Wt 211.4 lb

## 2019-01-23 DIAGNOSIS — Z794 Long term (current) use of insulin: Secondary | ICD-10-CM | POA: Diagnosis not present

## 2019-01-23 DIAGNOSIS — E1165 Type 2 diabetes mellitus with hyperglycemia: Secondary | ICD-10-CM | POA: Diagnosis not present

## 2019-01-23 DIAGNOSIS — E118 Type 2 diabetes mellitus with unspecified complications: Secondary | ICD-10-CM | POA: Diagnosis not present

## 2019-01-23 MED ORDER — JARDIANCE 10 MG PO TABS: 10.0000 mg | ORAL_TABLET | Freq: Every day | ORAL | 3 refills | Status: DC

## 2019-01-23 NOTE — Progress Notes (Signed)
Gregory May 58 y.o.           Reason for Appointment: follow-up   History of Present Illness   Diagnosis: Type 2 DIABETES MELITUS, date of diagnosis:  1999     Previous history: He has previously been treated with metformin and Victoza and subsequently mealtime insulin added to control postprandial hyperglycemia Overall he has been  relatively noncompliant with his diet, medications, monitoring and followup Previously would not take Victoza regularly because of cost. Has not been taking any medications for the last year and a half because of commitments to family and cost A1c had increased to 12.3% and hyperglycemia discovered when he was hospitalized for foot ulcer. Also lost 20 pounds because of hyperglycemia   For better control, compliance and inconvenience he was switched to the V-go pump on 02/21/16  Recent history:   Insulin regimen: V-go 20 unit basal, mealtime boluses 12-16 breakfast ---16-20 units dinner  Non-insulin hypoglycemic drugs: Metformin ER 1500 mg daily, was on Bydureon 2 mg weekly         A1c  is now 7.7 and again increasing     Management, blood sugar patterns and problems identified.  His blood sugars in the mornings are frequently high  Several times he has found his blood sugar to be high with eating late night snacks or sweets  Although he may rarely take an additional injection of NovoLog he periodically will run out of clicks for his meals or snacks  He takes up to 10 clicks for his meals  Also he may be getting more hungry with not taking Bydureon  Still not able to exercise much despite having a prosthesis  Blood sugars are usually fairly good after his first meal in the afternoon and also before his evening meal  He does adjust his boluses for the meal size or total carbohydrate intake, currently has a few readings after his meals which are not significantly high  Most of his late evening snacks are around midnight when he does not  check his sugars       Side effects from medications: None       Monitors blood glucose: Once a day .  Glucometer: One Touch.          Blood Glucose readings:    PRE-MEAL Fasting Lunch Dinner Bedtime Overall  Glucose range:  121-239   100-140    Mean/median:  165    140   POST-MEAL PC Breakfast PC Lunch PC Dinner  Glucose range:    128-135  Mean/median:       Previous readings:  PRE-MEAL Fasting Lunch Dinner Bedtime Overall  Glucose range:  112-172  88-157  85-127   Mean/median:  143   106  119     Meals:  usually 2 meals per day usually.  breakfast at 12 noon, evening meal 7-9 pm, variable snacks late at night    Physical activity: exercise: Minimal               Weight control:   Wt Readings from Last 3 Encounters:  01/23/19 211 lb 6.4 oz (95.9 kg)  01/06/19 213 lb (96.6 kg)  10/23/18 213 lb (96.6 kg)          Diabetes labs:  Lab Results  Component Value Date   HGBA1C 7.7 (H) 01/20/2019   HGBA1C 7.0 (H) 10/20/2018   HGBA1C 6.7 (H) 07/18/2018   Lab Results  Component Value Date   MICROALBUR 0.9 01/20/2019  LDLCALC 101 (H) 01/20/2019   CREATININE 1.05 01/20/2019    Lab Results  Component Value Date   FRUCTOSAMINE 243 03/07/2017   FRUCTOSAMINE 274 10/30/2016   FRUCTOSAMINE 260 04/13/2016     Other active problems: See review of systems    Lab on 01/20/2019  Component Date Value Ref Range Status  . Cholesterol 01/20/2019 170  0 - 200 mg/dL Final   ATP III Classification       Desirable:  < 200 mg/dL               Borderline High:  200 - 239 mg/dL          High:  > = 240 mg/dL  . Triglycerides 01/20/2019 131.0  0.0 - 149.0 mg/dL Final   Normal:  <150 mg/dLBorderline High:  150 - 199 mg/dL  . HDL 01/20/2019 41.90  >39.00 mg/dL Final  . VLDL 01/20/2019 26.2  0.0 - 40.0 mg/dL Final  . LDL Cholesterol 01/20/2019 101* 0 - 99 mg/dL Final  . Total CHOL/HDL Ratio 01/20/2019 4   Final                  Men          Women1/2 Average Risk     3.4           3.3Average Risk          5.0          4.42X Average Risk          9.6          7.13X Average Risk          15.0          11.0                      . NonHDL 01/20/2019 127.65   Final   NOTE:  Non-HDL goal should be 30 mg/dL higher than patient's LDL goal (i.e. LDL goal of < 70 mg/dL, would have non-HDL goal of < 100 mg/dL)  . Microalb, Ur 01/20/2019 0.9  0.0 - 1.9 mg/dL Final  . Creatinine,U 01/20/2019 77.7  mg/dL Final  . Microalb Creat Ratio 01/20/2019 1.1  0.0 - 30.0 mg/g Final  . Sodium 01/20/2019 135  135 - 145 mEq/L Final  . Potassium 01/20/2019 4.0  3.5 - 5.1 mEq/L Final  . Chloride 01/20/2019 100  96 - 112 mEq/L Final  . CO2 01/20/2019 29  19 - 32 mEq/L Final  . Glucose, Bld 01/20/2019 82  70 - 99 mg/dL Final  . BUN 01/20/2019 11  6 - 23 mg/dL Final  . Creatinine, Ser 01/20/2019 1.05  0.40 - 1.50 mg/dL Final  . Total Bilirubin 01/20/2019 0.3  0.2 - 1.2 mg/dL Final  . Alkaline Phosphatase 01/20/2019 85  39 - 117 U/L Final  . AST 01/20/2019 55* 0 - 37 U/L Final  . ALT 01/20/2019 65* 0 - 53 U/L Final  . Total Protein 01/20/2019 7.2  6.0 - 8.3 g/dL Final  . Albumin 01/20/2019 3.9  3.5 - 5.2 g/dL Final  . Calcium 01/20/2019 9.3  8.4 - 10.5 mg/dL Final  . GFR 01/20/2019 72.99  >60.00 mL/min Final  . Hgb A1c MFr Bld 01/20/2019 7.7* 4.6 - 6.5 % Final   Glycemic Control Guidelines for People with Diabetes:Non Diabetic:  <6%Goal of Therapy: <7%Additional Action Suggested:  >8%      Allergies as of 01/23/2019   No Known Allergies  Medication List       Accurate as of January 23, 2019 11:59 PM. If you have any questions, ask your nurse or doctor.        Accu-Chek FastClix Lancets Misc 1 each by Does not apply route daily.   Accu-Chek Guide Me w/Device Kit 1 each by Does not apply route daily.   acetaminophen 325 MG tablet Commonly known as: TYLENOL Take 650 mg by mouth every 6 (six) hours as needed.   cholecalciferol 1000 units tablet Commonly known as: VITAMIN D Take 1,000  Units by mouth daily.   gabapentin 300 MG capsule Commonly known as: NEURONTIN TAKE 1 CAPSULE BY MOUTH THREE TIMES DAILY   glucose blood test strip Commonly known as: Accu-Chek Guide 1 each by Other route as needed for other. Use as instructed to check blood sugar twice daily.   insulin aspart 100 UNIT/ML injection Commonly known as: NovoLOG INJECT 56 UNITS SUBCUTANEOUSLY ONCE DAILY IN V-GO PUMP   Jardiance 10 MG Tabs tablet Generic drug: empagliflozin Take 10 mg by mouth daily with breakfast. Started by: Elayne Snare, MD   lisinopril 5 MG tablet Commonly known as: ZESTRIL Take 1 tablet (5 mg total) by mouth daily.   metFORMIN 750 MG 24 hr tablet Commonly known as: GLUCOPHAGE-XR Take 2 tablets by mouth once daily   pravastatin 80 MG tablet Commonly known as: PRAVACHOL Take 1 tablet (80 mg total) by mouth daily. What changed: how much to take   V-Go 20 Kit 1 each by Does not apply route daily. Apply Vgo20 pump to arm once daily.       Allergies: No Known Allergies  Past Medical History:  Diagnosis Date  . Arthritis    bilateral hips  . Deep vein thrombosis (DVT) ([place])   . Diabetes mellitus    type II  . 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 DJD (degenerative joint disease)   . Pulmonary emboli ([place]) 02/08/2014   Date of diagnosis February 08 2014, on chest CTA Hospitalized for 3 days Had some symptoms of shortness of breath, and chest pain With intercurrent DVT of the left LE. Duration of anticoagulation: 8 months. End date 10/11/2014.  Anticoagulant: Lovenox 120 units daily Switched to Eliquis on 05/25/2014 per patient preference   . Pulmonary embolism ([place])   . Sebaceous cyst    on back of neck    Past Surgical History:  Procedure Laterality Date  . AMPUTATION Right 06/03/2015   Procedure: Right Below Knee Amputation;  Surgeon: [name], MD;  Location: [place];  Service: Orthopedics;  Laterality: Right;  . CLOSED REDUCTION WITH HUMER PIN INSERTION   1974   left hip  . HARDWARE REMOVAL Left 07/21/2014   Procedure: HARDWARE REMOVAL;  Surgeon: Ninetta Lights, MD;  Location: [place];  Service: Orthopedics;  Laterality: Left;  . JOINT REPLACEMENT  2006 (approx)   right hip replaced  . ROTATOR CUFF REPAIR  2005 (approx)   right   . 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 TOTAL HIP ARTHROPLASTY Left 07/21/2014   Procedure: TOTAL HIP ARTHROPLASTY ANTERIOR APPROACH;  Surgeon: Ninetta Lights, MD;  Location: [place];  Service: Orthopedics;  Laterality: Left;    Family History  Problem Relation Age of Onset  . Cancer Mother        breast, colon, liver  . Diabetes Father     Social History:  reports that he has never smoked. He  has never used smokeless tobacco. He reports current alcohol use. He reports that he does not use drugs.  Review of Systems:   Lipids: He is on Pravachol  with previously good control of LDL  He was advised to use 80 mg daily but he is still only taking half a tablet  Lab Results  Component Value Date   CHOL 170 01/20/2019   CHOL 161 10/20/2018   CHOL 162 07/18/2018   Lab Results  Component Value Date   HDL 41.90 01/20/2019   HDL 47.70 10/20/2018   HDL 45.60 07/18/2018   Lab Results  Component Value Date   LDLCALC 101 (H) 01/20/2019   LDLCALC 98 10/20/2018   LDLCALC 102 (H) 07/18/2018   Lab Results  Component Value Date   TRIG 131.0 01/20/2019   TRIG 73.0 10/20/2018   TRIG 71.0 07/18/2018   Lab Results  Component Value Date   CHOLHDL 4 01/20/2019   CHOLHDL 3 10/20/2018   CHOLHDL 4 07/18/2018   Lab Results  Component Value Date   LDLDIRECT 91.0 07/11/2015    Has history of abnormal liver functions which are persistent  Lab Results  Component Value Date   ALT 65 (H) 01/20/2019     HYPERTENSION: His lisinopril has been reduced to 5 mg because of tendency to low blood pressure with 10 mg previously Not monitoring at home  No lightheadedness recently  BP Readings from  Last 3 Encounters:  01/23/19 122/72  10/23/18 132/82  07/22/18 102/68    He has been followed by orthopedic surgeon and is concerned about any ulcers and calluses This morning he thought his left lower leg was more swollen     Examination:   BP 122/72 (BP Location: Left Arm, Patient Position: Sitting, Cuff Size: Normal)   Pulse 79   Ht 6' (1.829 m)   Wt 211 lb 6.4 oz (95.9 kg)   SpO2 99%   BMI 28.67 kg/m   Body mass index is 28.67 kg/m.   ASSESSMENT/ PLAN:   Inspection of the left foot indicates callus formation both distally and near the proximal part of the foot No ulceration He has mild swelling of the foot and lower leg No areas of redness   Diabetes type 2:  See history of present illness for detailed discussion of current diabetes management, blood sugar patterns and problems identified   A1c is again higher at 7.7  He is on a V-go pump along with metformin Still cannot afford Bydureon which was helping him with his readings overall and keeping his overnight blood sugars better along with better diet compliance  Although he has not gained any weight he is still eating larger portions recently and more snacks especially late at night causing some hypoglycemia during the day Requiring relatively large doses of mealtime boluses  Discussed treatment options and since Bydureon is more affordable he can be tried on SGLT2 drug Discussed action of SGLT 2 drugs on lowering glucose by decreasing kidney absorption of glucose, benefits of weight loss and lower blood pressure, possible side effects including candidiasis and dosage regimen  He will start with 10 mg of Jardiance  With starting Jardiance he may be able to cut back on his mealtime boluses and he will watch his sugars more carefully Also he will need to stop his lisinopril to avoid low blood pressure  Left lower leg edema: This is mild and may be from venous insufficiency Needs to follow-up with his PCP; however  with starting [facility]  this should improve  LIPIDS: LDL is just above 100 with taking pravastatin and may consider switching to Crestor  He needs to discuss his abnormal liver tests with PCP for further evaluation  Mild hypertension: Taking 5 mg lisinopril with control    Counseling time on subjects discussed in assessment and plan sections is over 50% of today's 25 minute visit    Patient Instructions  [employer] Novolin R Click 30 min before meals  Jardiance in am: stop Lisinopril  Check blood sugars on waking up 3 days a week  Also check blood sugars about 2 hours after meals and do this after different meals by rotation  Recommended blood sugar levels on waking up are 90-130 and about 2 hours after meal is 130-160  Please bring your blood sugar monitor to each visit, thank you       Elayne Snare 01/24/2019, 12:01 PM   Note: This office note was prepared with Dragon voice recognition system technology. Any transcriptional errors that result from this process are unintentional.

## 2019-01-23 NOTE — Patient Instructions (Addendum)
[employer] Novolin R Click 30 min before meals  Jardiance in am: stop Lisinopril  Check blood sugars on waking up 3 days a week  Also check blood sugars about 2 hours after meals and do this after different meals by rotation  Recommended blood sugar levels on waking up are 90-130 and about 2 hours after meal is 130-160  Please bring your blood sugar monitor to each visit, thank you

## 2019-01-24 ENCOUNTER — Other Ambulatory Visit: Payer: Self-pay | Admitting: Endocrinology

## 2019-01-30 ENCOUNTER — Other Ambulatory Visit: Payer: Self-pay | Admitting: Endocrinology

## 2019-02-03 ENCOUNTER — Ambulatory Visit (INDEPENDENT_AMBULATORY_CARE_PROVIDER_SITE_OTHER): Payer: Medicare HMO | Admitting: Physician Assistant

## 2019-02-03 ENCOUNTER — Other Ambulatory Visit: Payer: Self-pay

## 2019-02-03 ENCOUNTER — Encounter: Payer: Self-pay | Admitting: Orthopedic Surgery

## 2019-02-03 VITALS — Ht 72.0 in | Wt 211.4 lb

## 2019-02-03 DIAGNOSIS — L97421 Non-pressure chronic ulcer of left heel and midfoot limited to breakdown of skin: Secondary | ICD-10-CM | POA: Diagnosis not present

## 2019-02-03 DIAGNOSIS — E1142 Type 2 diabetes mellitus with diabetic polyneuropathy: Secondary | ICD-10-CM

## 2019-02-03 DIAGNOSIS — L97521 Non-pressure chronic ulcer of other part of left foot limited to breakdown of skin: Secondary | ICD-10-CM

## 2019-02-03 DIAGNOSIS — B351 Tinea unguium: Secondary | ICD-10-CM | POA: Diagnosis not present

## 2019-02-03 DIAGNOSIS — Z89511 Acquired absence of right leg below knee: Secondary | ICD-10-CM | POA: Diagnosis not present

## 2019-02-03 NOTE — Progress Notes (Signed)
Office Visit Note   Patient: Gregory May           Date of Birth: [birth year]           MRN: [id number] Visit Date: 02/03/2019              Requested by: Lanelle BalHarbrecht, Lawrence, MD [address] [place] [id number] PCP: Lanelle BalHarbrecht, Lawrence, MD  Chief Complaint  Patient presents with  . Left Leg - Follow-up  . Left Foot - Follow-up      HPI: The patient is a 58 year old gentleman who is seen for follow-up of his right transtibial amputation and for a Waggoner grade 1 ulceration over the left plantar foot.  He reports no problems since his last visit.  He does report some recurrent tenderness over the callus/ulcer area of his left foot.  Assessment & Plan: Visit Diagnoses:  1. Ulcer of left foot, limited to breakdown of skin (HCC)   2. Ulcer of left heel, limited to breakdown of skin (HCC)   3. Acquired absence of right leg below knee (HCC)   4. Diabetic polyneuropathy associated with type 2 diabetes mellitus (HCC)   5. Onychomycosis     Plan: After informed consent the left foot first and fourth metatarsal ulcer/calluses were debrided to healthy viable tissue with a #10 blade knife and the patient tolerated this well. The patient will continue his Vive medical compression sock to the left foot. He reports no problems over his right transtibial amputation site. He will follow-up in 6 weeks.  Follow-Up Instructions: Return in about 6 weeks (around 03/17/2019).   Ortho Exam  Patient is alert, oriented, no adenopathy, well-dressed, normal affect, normal respiratory effort. After informed consent the left foot first and fourth metatarsal head ulceration/callus were debrided to healthy viable tissue.  Silver nitrate was used for hemostasis.  There are no signs of infection or cellulitis.  The first metatarsal head ulcer is approximately 1 cm in diameter and 1 to 2 mm of depth.  The fourth metatarsal head area appears to be callus only without clear ulceration today.  There is  continued dry skin over the heel but no ulceration is appreciated.  He has palpable pedal pulses in the left foot. His right transtibial amputation site is well consolidated well-healed without pressure areas or breakdown.  Imaging: No results found. No images are attached to the encounter.  Labs: Lab Results  Component Value Date   HGBA1C 7.7 (H) 01/20/2019   HGBA1C 7.0 (H) 10/20/2018   HGBA1C 6.7 (H) 07/18/2018   ESRSEDRATE 56 (H) 06/01/2015   REPTSTATUS 06/07/2015 FINAL 06/01/2015   CULT  06/01/2015    NO GROWTH 5 DAYS [facility]      Lab Results  Component Value Date   ALBUMIN 3.9 01/20/2019   ALBUMIN 4.2 10/20/2018   ALBUMIN 4.1 07/18/2018    Body mass index is 28.67 kg/m.  Orders:  No orders of the defined types were placed in this encounter.  No orders of the defined types were placed in this encounter.    Procedures: No procedures performed  Clinical Data: No additional findings.  ROS:  All other systems negative, except as noted in the HPI. Review of Systems  Objective: Vital Signs: Ht 6' (1.829 m)   Wt 211 lb 6.4 oz (95.9 kg)   BMI 28.67 kg/m   Specialty Comments:  No specialty comments available.  PMFS History: Patient Active Problem List   Diagnosis Date Noted  .  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 Essential hypertension 06/19/2018  . Peripheral neuropathy 12/19/2017  . Non-pressure chronic ulcer of right calf, limited to breakdown of skin ([place]) 04/17/2017  . Acquired absence of right leg below knee ([place]) 04/17/2017  . Carpal tunnel syndrome 03/18/2017  . Colon cancer screening 06/21/2015  . Status post below knee amputation of right lower extremity ([place]) 06/07/2015  . Diabetic polyneuropathy associated with type 2 diabetes mellitus ([place]) 12/09/2014  . Diabetes mellitus with carpal tunnel syndrome ([place]) 12/09/2014  . DJD (degenerative joint disease) of  knee 07/21/2014  . Osteoarthritis, hip, bilateral 05/25/2014  . Pulmonary emboli ([place]) 02/08/2014  . DVT (deep venous thrombosis) ([place]) 02/08/2014  . Onychomycosis 10/14/2013  . Pure hypercholesterolemia 09/25/2013  . Diabetic foot ulcer ([place]) 09/15/2013  . Sebaceous cyst 08/16/2011  . Poorly controlled type 2 diabetes mellitus with complication ([place]) 27/74/1287   Past Medical History:  Diagnosis Date  . Arthritis    bilateral hips  . Deep vein thrombosis (DVT) ([place])   . Diabetes mellitus    type II  . 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 DJD (degenerative joint disease)   . Pulmonary emboli ([place]) 02/08/2014   Date of diagnosis February 08 2014, on chest CTA Hospitalized for 3 days Had some symptoms of shortness of breath, and chest pain With intercurrent DVT of the left LE. Duration of anticoagulation: 8 months. End date 10/11/2014.  Anticoagulant: Lovenox 120 units daily Switched to Eliquis on 05/25/2014 per patient preference   . Pulmonary embolism ([place])   . Sebaceous cyst    on back of neck    Family History  Problem Relation Age of Onset  . Cancer Mother        breast, colon, liver  . Diabetes Father     Past Surgical History:  Procedure Laterality Date  . AMPUTATION Right 06/03/2015   Procedure: Right Below Knee Amputation;  Surgeon: [name], MD;  Location: [facility];  Service: Orthopedics;  Laterality: Right;  . CLOSED REDUCTION WITH HUMER PIN INSERTION  1974   left hip  . HARDWARE REMOVAL Left 07/21/2014   Procedure: HARDWARE REMOVAL;  Surgeon: Ninetta Lights, MD;  Location: [facility];  Service: Orthopedics;  Laterality: Left;  . JOINT REPLACEMENT  2006 (approx)   right hip replaced  . ROTATOR CUFF REPAIR  2005 (approx)   right   . 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 TOTAL HIP ARTHROPLASTY Left 07/21/2014   Procedure: TOTAL HIP ARTHROPLASTY ANTERIOR APPROACH;  Surgeon: Ninetta Lights, MD;  Location: [facility];  Service: Orthopedics;  Laterality:  Left;   Social History   Occupational History  . Not on file  Tobacco Use  . Smoking status: Never Smoker  . Smokeless tobacco: Never Used  Substance and Sexual Activity  . Alcohol use: Yes    Alcohol/week: 0.0 standard drinks    Comment: occasional  . Drug use: No  . Sexual activity: Never

## 2019-02-05 ENCOUNTER — Telehealth: Payer: Self-pay | Admitting: Endocrinology

## 2019-02-05 NOTE — Telephone Encounter (Signed)
Yes he will take the ReliOn brand same dose as NovoLog

## 2019-02-05 NOTE — Telephone Encounter (Signed)
Patient is needing the name of the generic brand of Novolog.  Please Advise, Thanks

## 2019-02-05 NOTE — Telephone Encounter (Signed)
Called pt to inquire further. Asking for a Rx for [employer] brand insulin comparable to Novolog. Please advise if dosage will remain unchanged. If so, will send Rx for Relion

## 2019-02-06 ENCOUNTER — Other Ambulatory Visit: Payer: Self-pay

## 2019-02-06 NOTE — Telephone Encounter (Signed)
Once again, called pt to inform about below. Left detailed voice message informing him of my message below. Advised he call if he did not understand the instructions provided OR if he had questions.

## 2019-02-06 NOTE — Telephone Encounter (Signed)
[place] to inquire if pt needed a Rx for the Relion brand of Novolog. Pharmacist states that it does not require a Rx and can be purchased OTC by the pt. LVM to inform pt. Requested he return my call.

## 2019-02-06 NOTE — Progress Notes (Unsigned)
reli on

## 2019-02-06 NOTE — Telephone Encounter (Signed)
Patient called today requesting the name and dosage of Relion insulin so that he can pick some up at [facility] saw note you put in Ammie can you assist him with this

## 2019-02-20 ENCOUNTER — Telehealth: Payer: Self-pay | Admitting: Endocrinology

## 2019-02-20 NOTE — Telephone Encounter (Signed)
Patient called to advised that he was prescribed Jardiance and it is cost prohibitive this month. Wants to see if he could get a sample, discount card, or a different medicine.    Also atient is going to see his PCP for possible ear infection and wonders if that has been affecting his blood sugars and whether or not he can hold off on taking Jardiance  Call back number for [phone]

## 2019-02-20 NOTE — Telephone Encounter (Signed)
Please advise 

## 2019-02-20 NOTE — Telephone Encounter (Signed)
Called pt and did not get an answer. Will call back later.

## 2019-02-20 NOTE — Telephone Encounter (Signed)
We cannot substitute anything for Jardiance.  Does he know if he can afford Bydureon.  Otherwise he can start Actos 15 mg daily and be consistent with diet until the next visit

## 2019-02-23 ENCOUNTER — Other Ambulatory Visit: Payer: Self-pay

## 2019-02-23 ENCOUNTER — Ambulatory Visit: Payer: Medicare HMO

## 2019-02-23 ENCOUNTER — Ambulatory Visit (INDEPENDENT_AMBULATORY_CARE_PROVIDER_SITE_OTHER): Payer: Medicare HMO | Admitting: Internal Medicine

## 2019-02-23 ENCOUNTER — Ambulatory Visit: Payer: Self-pay

## 2019-02-23 VITALS — BP 144/89 | HR 60 | Temp 98.1°F | Ht 72.0 in | Wt 212.0 lb

## 2019-02-23 DIAGNOSIS — E1165 Type 2 diabetes mellitus with hyperglycemia: Secondary | ICD-10-CM

## 2019-02-23 DIAGNOSIS — Z794 Long term (current) use of insulin: Secondary | ICD-10-CM | POA: Diagnosis not present

## 2019-02-23 DIAGNOSIS — I10 Essential (primary) hypertension: Secondary | ICD-10-CM

## 2019-02-23 DIAGNOSIS — H60311 Diffuse otitis externa, right ear: Secondary | ICD-10-CM

## 2019-02-23 DIAGNOSIS — E118 Type 2 diabetes mellitus with unspecified complications: Secondary | ICD-10-CM

## 2019-02-23 DIAGNOSIS — H6091 Unspecified otitis externa, right ear: Secondary | ICD-10-CM | POA: Diagnosis not present

## 2019-02-23 DIAGNOSIS — Z8669 Personal history of other diseases of the nervous system and sense organs: Secondary | ICD-10-CM

## 2019-02-23 DIAGNOSIS — Z79899 Other long term (current) drug therapy: Secondary | ICD-10-CM

## 2019-02-23 DIAGNOSIS — H6090 Unspecified otitis externa, unspecified ear: Secondary | ICD-10-CM | POA: Insufficient documentation

## 2019-02-23 MED ORDER — CIPROFLOXACIN HCL 500 MG PO TABS: 500.0000 mg | ORAL_TABLET | Freq: Two times a day (BID) | ORAL | 0 refills | Status: DC

## 2019-02-23 NOTE — Patient Outreach (Signed)
[facility]) Care Management  02/23/2019  Gregory May 1961/07/15 [id number]   Telephone Screen  Referral Date: 02/23/2019 Referral Source: MD Office Referral Reason: " help with Jardiance patient assistance program" Insurance: [facility]   Outreach attempt # 1 to patient. Spoke with patient who reported that he was currently driving and unable to talk at present. He requested a call back at another time.        Plan: RN CM will make outreach attempt to patient within 3-4 business days.  [name], RN,BSN,CCM [place] Management Telephonic Care Management Coordinator Direct Phone: [phone] Toll Free: [phone] Fax: [phone]

## 2019-02-23 NOTE — Progress Notes (Signed)
   CC: right ear fullness  HPI:  Mr.Deitrich T Arther is a 58 y.o. 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 He notes decreased hearing as well. He denies inner or outer ear pain, mastoid pain, fever, tinnitus, dizziness, or changes in vision. He has noticed some drainage to his throat and has had a mildly sore throat the past two days. No cough or SOB. He was recently started on jardiance but has not been able to afford this yet, and his glucose at home has been climbing higher, ranging from 150 - 200 whereas it is usually 90 - 150. He states he will be able to afford this medication next month.   Please see A&P for assessment of the patient's acute and chronic medical conditions.    Past Medical History:  Diagnosis Date  . Arthritis    bilateral hips  . Deep vein thrombosis (DVT) ([place])   . Diabetes mellitus    type II  . 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 DJD (degenerative joint disease)   . Pulmonary emboli ([place]) 02/08/2014   Date of diagnosis February 08 2014, on chest CTA Hospitalized for 3 days Had some symptoms of shortness of breath, and chest pain With intercurrent DVT of the left LE. Duration of anticoagulation: 8 months. End date 10/11/2014.  Anticoagulant: Lovenox 120 units daily Switched to Eliquis on 05/25/2014 per patient preference   . Pulmonary embolism ([place])   . Sebaceous cyst    on back of neck   Review of Systems:   ROS negative except as noted in HPI.   Physical Exam:  Constitution: NAD, appears stated age 23: left ear: external canal without erythema or wax, TM wnl;  Right external canal with purulent drainage, and erythema, TM not visible, auricle, jaw and mastoid areas NTTP. No pharyngeal exudate or erythema Eyes: no icterus or injection  Neuro: a&o, normal affect  Skin:  c/d/i    Vitals:   02/23/19 0905 02/23/19 0913  BP: (!) 149/81 (!) 144/89  Pulse: 60   Temp: 98.1 F (36.7 C)   TempSrc: Oral   SpO2: 98%   Weight: 212 lb (96.2 kg)   Height: 6' (1.829 m)      Assessment & Plan:   See Encounters Tab for problem based charting.  Patient discussed with Dr. Dareen Piano

## 2019-02-23 NOTE — Assessment & Plan Note (Signed)
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 He notes decreased hearing as well. He denies inner or outer ear pain, mastoid pain, fever, tinnitus, dizziness, or changes in vision. He has noticed some drainage to his throat and has had a mildly sore throat the past two days. No cough or SOB. He was recently started on jardiance but has not been able to afford this yet, and his glucose at home has been climbing higher, ranging from 150 - 200 whereas it is usually 90 - 150. He states he will be able to afford this medication next month.  Physical exam shows external ear canal with purulent material and erythema. Hearing is intact. TM is not visible. No tenderness to auricle or mastoid area. No pharyngeal erythema or exudate. He states he has a history of TM rupture with some irritation several years ago. He went to ENT and persistent hole in his TM was not visualized. I unfortunately cannot find records. As he is having some sensation of drainage and sore throat I will treat his external otitis media with oral antibiotics as there may now be a component of otitis media as well. Symptoms are not currently consistent with malignant otitis externa. Will hold topical steroids as well with possibility of chronic TM perforation  - oral cipro 500 mg bid for 7 days  - f/u in two weeks  - provided jardiance samples for the next two months.  - Otitis externa print out - advised avoidance of submersion of ears or applying water directly until infection clears, advised avoiding q-tips as well.  - f/u ENT if continues to have symptoms - given return precautions

## 2019-02-23 NOTE — Assessment & Plan Note (Signed)
BP Readings from Last 3 Encounters:  02/23/19 (!) 144/89  01/23/19 122/72  10/23/18 132/82   He was told by Dr. Dwyane Dee that he would likely be able to stop his lisinopril with starting jardiance as this was for kidney protection. He is still currently taking this as he has not been able to start the jardiance yet. Blood pressure is elevated today. He did note that this is abnormally high for him.   - recommended continuing lisinopril 5 mg qd even after starting jardiance - f/u two week for bp check

## 2019-02-23 NOTE — Assessment & Plan Note (Signed)
Glucose recently elevated due to being unable to afford jardiance (possible also external otitis), which was added to his regimen per Dr. Dwyane Dee. He states he will be able to afford this medication starting next month. Prior to this he was prescribed byduron, which he also could not afford. Current medications include novolog with V-Go 20 and metformin 750 mg q24h.   - provided two months jardiance samples

## 2019-02-23 NOTE — Patient Instructions (Signed)
Thank you for allowing [place] to provide your care today. Today we discussed external ear infection    Today we made the following changes to your medications:   Please START taking  Ciprofloxacin 500 MG tablet - take one tablet two times per day for seven days.   Please follow-up if you start to develop pain in your ear, pain behind your ear, worsening sore throat, or symptoms do not improve, please call the clinic to schedule another appointment.     Should you have any questions or concerns please call the internal medicine clinic at [phone].

## 2019-02-23 NOTE — Patient Instructions (Signed)
Medication Samples have been provided to the patient.  Drug: Jardiance Strength: 10mg  Qty: 8 bottles LOT: [id number] Exp.Date: aug2022 Dosing instructions: 1 tablet daily  The patient has been instructed regarding the correct time, dose, and frequency of taking this medication, including desired effects and most common side effects. Patient advised to contact clinic if any questions arise and verbalized understanding by repeat back.  Leonor Liv 9:45 AM 02/23/2019

## 2019-02-24 ENCOUNTER — Other Ambulatory Visit: Payer: Self-pay

## 2019-02-24 NOTE — Patient Outreach (Signed)
[facility]) Care Management  02/24/2019  [name] 1960-12-16 [id number]   Telephone Screen  Referral Date: 02/23/2019 Referral Source: MD Office Referral Reason: " help with Jardiance patient assistance program" Insurance: [facility]    Outreach attempt #2 to patient. No answer. RN CM left HIPAA compliant voicemail message along with contact info.     Plan: RN CM will make outreach attempt to patient within 3-4 business days. RN CM will send unsuccessful outreach letter to patient.   [name], RN,BSN,CCM [place] Management Telephonic Care Management Coordinator Direct Phone: [phone] Toll Free: [phone] Fax: [phone]

## 2019-02-24 NOTE — Patient Outreach (Signed)
[facility]) Care Management  02/24/2019  DARUIS SWAIM 1960/08/21 [id number]   Telephone Screen  Referral Date:02/23/2019 Referral Source:MD Office Referral Reason:" help with Jardiance patient assistance program" Insurance:[employer]    Voicemail message received from patient. Return call placed to patient. No answer at present.     Plan: RN CM will make outreach attempt to patient within 3-4 business days.   [name], RN,BSN,CCM [place] Management Telephonic Care Management Coordinator Direct Phone: [phone] Toll Free: [phone] Fax: [phone]

## 2019-02-25 ENCOUNTER — Other Ambulatory Visit: Payer: Self-pay

## 2019-02-25 MED ORDER — PIOGLITAZONE HCL 15 MG PO TABS: 15.0000 mg | ORAL_TABLET | Freq: Every day | ORAL | 3 refills | Status: DC

## 2019-02-25 NOTE — Patient Outreach (Signed)
[facility]) Care Management  02/25/2019  [name] 08-21-1960 [id number]   Telephone Screen  Referral Date:02/23/2019 Referral Source:MD Office Referral Reason:" help with Jardiance patient assistance program" Insurance:[employer]   Incoming call from patient returning RN CM call. Patient states that [place] helping his uncle with a project which is why it has been hard for RN CM to reach him. He voices that he is independent with ADLs/IADLs. He states that his brother lives with him but "they do their own thing." He is able to drive himself to appts. Per chart review, patient has PMH of DM, arthritis,DVT, DJD and PE. Patient states he has been a diabetic since he was about 58 yrs old. He voices that he knows how to manage his diabetes. He declines needing further education and support. He states that his blood sugars are normally WNL except for the past week they have been elevated at 150s-200s. He went to see MD and was told that he had external ear infection which was affecting cbgs. He was placed on antibiotic and patient voices that issue is resolving.  He report that he is currently taking about 10 meds. He states that his diabetic meds_Novolog and Jardiance are tier 3 &4 meds are costly for him. He was just started on Jardiance. He has not been able to afford med. MD office has provided him with a little over a month supply of meds. Patient states that he was taking another diabetic med prior to Jardiance(unable to recall name) but stopped taking it due to cost. [facility] services reviewed and discussed with patient. Verbal consent for services provided by patient. He is agreeable to [place] referral for possible med assistance but voices no other [facility] needs or concerns at this time.   Plan: RN CM will send [facility] pharmacy referral for possible med assistance.  [name], RN,BSN,CCM [place] Management Telephonic Care  Management Coordinator Direct Phone: [phone] Toll Free: [phone] Fax: [phone]

## 2019-02-25 NOTE — Telephone Encounter (Signed)
Called pt and left detailed voicemail with MD message. Sent Rx to pharmacy.

## 2019-02-25 NOTE — Patient Outreach (Signed)
[facility]) Care Management  02/25/2019  Gregory May Dec 13, 1960 [id number]   Telephone Screen  Referral Date:02/23/2019 Referral Source:MD Office Referral Reason:" help with Jardiance patient assistance program" Insurance:[employer]     Outreach attempt #3 to patient. No answer. RN CM left HIPAA compliant voicemail message along with contact info.     Plan: RN CM will make outreach attempt to patient within 3-4 business days.    [name], RN,BSN,CCM [place] Management Telephonic Care Management Coordinator Direct Phone: [phone] Toll Free: [phone] Fax: [phone]

## 2019-02-25 NOTE — Addendum Note (Signed)
Addended by: Aldine Contes on: 02/25/2019 10:18 AM   Modules accepted: Level of Service

## 2019-02-25 NOTE — Progress Notes (Signed)
Internal Medicine Clinic Attending  Case discussed with Dr. Seawell at the time of the visit.  We reviewed the resident's history and exam and pertinent patient test results.  I agree with the assessment, diagnosis, and plan of care documented in the resident's note.    

## 2019-02-26 ENCOUNTER — Telehealth: Payer: Self-pay | Admitting: Pharmacist

## 2019-02-26 ENCOUNTER — Other Ambulatory Visit: Payer: Self-pay | Admitting: Pharmacy Technician

## 2019-02-26 NOTE — Telephone Encounter (Signed)
Pt states that his PCP was able to give him samples of Jardiance so he is going to take this until samples run out - his PCP is trying to get him some assistance to help pay for this medication so that he does not have to stop it. Pt is going to hold the Actos at this time until he absolutely needs it if not able to get Jardiance.   Pt states that he was told not to take the Lisinopril with the Jardiance - pt is wanting this verified.  Per last OV 01/23/2019 he was advised to stop the Lisinopril while taking Jardiance.   Pt expressed understanding  Will send to Dr Dwyane Dee to make aware.   Nothing further needed.

## 2019-02-26 NOTE — Patient Outreach (Signed)
[facility]) Care Management  02/26/2019  KAYLAN FRIEDMANN January 31, 1961 [id number]                          Medication Assistance Referral  Referral From: [place]  Medication/Company: [employer] / [employer] Patient application portion:  Education officer, museum portion: Faxed  to Dr Dwyane Dee  Medication/Company: [employer] / [employer] Patient application portion:  Mailed Provider application portion: Faxed  to Dr Dwyane Dee    Follow up:  Will follow up with patient in 5-10 business days to confirm application(s) have been received.  Malvin Morrish P. Winola Drum, [place] Management [phone]

## 2019-02-26 NOTE — Patient Outreach (Signed)
[facility]) Care Management  [name]   02/26/2019  Gregory May 02-Dec-1960 [id number]  Reason for referral: medication assistance  Referral source: [facility] Telephonic Nurse Referral medication(s): Jardiance and Novolog Current insurance: [employer]  PMHx:  Type 2 diabetes with peripheral neuropathy, history of DVT, hypertension, osteoarthritis of hip, status post below the knee amputation.   HPI:  Patient is a 58 year old male with the medical conditions listed above.  HIPAA identifiers were obtained. Patient stated his medications are getting too expensive.  Novolog is costing him $45 per vial and he was recently started on Jardiance (he reported getting samples from his PCP).     Objective: No Known Allergies  Medications Reviewed Today    Reviewed by Elayne Guerin, [facility] (Pharmacist) on 02/26/19 at [place] List Status: <None>  Medication Order Taking? Sig Documenting Provider Last Dose Status Informant  ACCU-CHEK FASTCLIX LANCETS [place] [id number] Yes 1 each by Does not apply route daily. Elayne Snare, MD Taking Active   acetaminophen (TYLENOL) 325 MG tablet [id number] Yes Take 650 mg by mouth every 6 (six) hours as needed. [provider] Taking Active   Blood Glucose Monitoring Suppl (ACCU-CHEK GUIDE ME) w/Device KIT [id number] Yes 1 each by Does not apply route daily. Elayne Snare, MD Taking Active   cholecalciferol (VITAMIN D) 1000 units tablet [id number] Yes Take 1,000 Units by mouth daily. [provider] Taking Active   ciprofloxacin (CIPRO) 500 MG tablet [id number] Yes Take 1 tablet (500 mg total) by mouth 2 (two) times daily. Seawell, Jaimie A, DO Taking Active   gabapentin (NEURONTIN) 300 MG capsule [id number] Yes TAKE 1 CAPSULE BY MOUTH THREE TIMES DAILY Kathi Ludwig, MD Taking Active   glucose blood (ACCU-CHEK GUIDE) test strip [id number] Yes 1 each by Other route as needed for other. Use as instructed to check blood sugar twice  daily. Elayne Snare, MD Taking Active   insulin aspart (NOVOLOG) 100 UNIT/ML injection [id number] Yes INJECT 56 UNITS SUBCUTANEOUSLY ONCE DAILY IN V-GO PUMP Elayne Snare, MD Taking Active   Insulin Disposable Pump (V-GO 20) KIT [id number] Yes APPLY V-GO 20 PUMP TO ARM ONE TIME DAILY AS DIRECTED Elayne Snare, MD Taking Active   lisinopril (PRINIVIL,ZESTRIL) 5 MG tablet [id number] Yes Take 1 tablet (5 mg total) by mouth daily. Elayne Snare, MD Taking Active   metFORMIN (GLUCOPHAGE-XR) 750 MG 24 hr tablet [id number] Yes Take 2 tablets by mouth once daily Elayne Snare, MD Taking Active   pioglitazone (ACTOS) 15 MG tablet [id number] Yes Take 1 tablet (15 mg total) by mouth daily. Take 1 tablet by mouth once daily. Elayne Snare, MD Taking Active   pravastatin (PRAVACHOL) 80 MG tablet [id number] Yes Take 1 tablet (80 mg total) by mouth daily.  Patient taking differently: Take 40 mg by mouth daily.    Elayne Snare, MD Taking Active           Assessment:  Drugs sorted by system:  Neurologic/Psychologic: Gabapentin  Cardiovascular: Lisinopril (held while taking Jardiance), Pravastatin  Endocrine: Jardiance, Novolog, V-Go, Metformin, Pioglitazone (not taking yet)  Pain: Acetaminophen  Infectious Diseases: Ciprofloxacin,  Vitamins/Minerals/Supplements: Cholecalciferol,   Medication Review Findings:  . HgA1c- 7% . On statin  Medication Assistance Findings:  Medication assistance needs identified: Novolog, Jardiance   Novolog-available through [employer] patient assistance program  Jardiance available through [place] patient assistance program.  Patient may qualify for Extra Help/LIS-Patient was on vacation at the time of my call. LIS application will be  completed at a later date.  Additional medication assistance options reviewed with patient as warranted:  Coupon and Discount pharmacy lists  Plan: I will route patient assistance letter to [place] technician who will  coordinate patient assistance program application process for medications listed above.  [facility] pharmacy technician will assist with obtaining all required documents from both patient and provider(s) and submit application(s) once completed.    Call patient back next week to complete LIS application and send free [employer].   Elayne Guerin, PharmD, [place] Clinical Pharmacist [phone]

## 2019-03-02 ENCOUNTER — Ambulatory Visit: Payer: Medicare HMO

## 2019-03-03 ENCOUNTER — Other Ambulatory Visit: Payer: Self-pay

## 2019-03-03 ENCOUNTER — Other Ambulatory Visit (INDEPENDENT_AMBULATORY_CARE_PROVIDER_SITE_OTHER): Payer: Medicare HMO

## 2019-03-03 DIAGNOSIS — Z794 Long term (current) use of insulin: Secondary | ICD-10-CM | POA: Diagnosis not present

## 2019-03-03 DIAGNOSIS — E1165 Type 2 diabetes mellitus with hyperglycemia: Secondary | ICD-10-CM

## 2019-03-03 LAB — BASIC METABOLIC PANEL
BUN: 15 mg/dL (ref 6–23)
CO2: 26 mEq/L (ref 19–32)
Calcium: 9.6 mg/dL (ref 8.4–10.5)
Chloride: 101 mEq/L (ref 96–112)
Creatinine, Ser: 0.94 mg/dL (ref 0.40–1.50)
GFR: 82.9 mL/min (ref >60.00)
Glucose, Bld: 123 mg/dL — ABNORMAL HIGH (ref 70–99)
Potassium: 4.2 mEq/L (ref 3.5–5.1)
Sodium: 136 mEq/L (ref 135–145)

## 2019-03-04 LAB — FRUCTOSAMINE: Fructosamine: 272 umol/L (ref 0–285)

## 2019-03-05 ENCOUNTER — Ambulatory Visit (INDEPENDENT_AMBULATORY_CARE_PROVIDER_SITE_OTHER): Payer: Medicare HMO | Admitting: Endocrinology

## 2019-03-05 ENCOUNTER — Other Ambulatory Visit: Payer: Self-pay

## 2019-03-05 ENCOUNTER — Encounter: Payer: Self-pay | Admitting: Endocrinology

## 2019-03-05 VITALS — BP 122/72 | HR 87 | Ht 72.0 in | Wt 205.8 lb

## 2019-03-05 DIAGNOSIS — E1165 Type 2 diabetes mellitus with hyperglycemia: Secondary | ICD-10-CM

## 2019-03-05 DIAGNOSIS — R748 Abnormal levels of other serum enzymes: Secondary | ICD-10-CM

## 2019-03-05 DIAGNOSIS — Z794 Long term (current) use of insulin: Secondary | ICD-10-CM

## 2019-03-05 LAB — ALT: ALT: 45 U/L (ref 0–53)

## 2019-03-05 MED ORDER — INSULIN REGULAR HUMAN 100 UNIT/ML IJ SOLN: 56.0000 [IU] | Freq: Every day | INTRAMUSCULAR | 3 refills | Status: DC

## 2019-03-05 NOTE — Progress Notes (Signed)
Please call to let patient know that the liver test results are normal and no further action needed

## 2019-03-05 NOTE — Progress Notes (Signed)
Gregory May 58 y.o.           Reason for Appointment: follow-up   History of Present Illness   Diagnosis: Type 2 DIABETES MELITUS, date of diagnosis:  1999     Previous history: He has previously been treated with metformin and Victoza and subsequently mealtime insulin added to control postprandial hyperglycemia Overall he has been  relatively noncompliant with his diet, medications, monitoring and followup Previously would not take Victoza regularly because of cost. Has not been taking any medications for the last year and a half because of commitments to family and cost A1c had increased to 12.3% and hyperglycemia discovered when he was hospitalized for foot ulcer. Also lost 20 pounds because of hyperglycemia   For better control, compliance and inconvenience he was switched to the V-go pump on 02/21/16  Recent history:   Insulin regimen: V-go 20 unit basal with Novolin NPH insulin, mealtime boluses 12-16 breakfast ---16-20 units dinner  Non-insulin hypoglycemic drugs: Metformin ER 1500 mg daily    Jardiance 10 mg daily    A1c  is recently 7.7 and again increasing     Management, blood sugar patterns and problems identified.  He is here for short-term follow-up because of his blood sugars being out of control with stopping Bydureon and his not being able to afford Jardiance or NovoLog  He was sent a prescription for the regular insulin from [facility] but not clear why he picked up the NPH insulin on his own and using this in his pump  As before he is checking his blood sugars infrequently they appear to be higher with only a couple of good readings before dinnertime  He is still inconsistent with diet and with poor choices at night and snacks his blood sugar may be significantly higher at times  He is not having any hypoglycemia with lowest reading 72  He is still trying to take boluses as before and blood sugars after dinner are variable  As before cannot exercise   Difficult to judge his weight as it does appear to be lower  Currently taking Jardiance with a sample       Side effects from medications: None       Monitors blood glucose: Once a day .  Glucometer: One Touch.          Blood Glucose readings:    PRE-MEAL Fasting Lunch Dinner Bedtime Overall  Glucose range:  139-171   72-215  140-277   Mean/median:      155   Previous readings:   PRE-MEAL Fasting Lunch Dinner Bedtime Overall  Glucose range:  121-239   100-140    Mean/median:  165    140   POST-MEAL PC Breakfast PC Lunch PC Dinner  Glucose range:    128-135  Mean/median:         Meals:  usually 2 meals per day usually.  breakfast at 12 noon, evening meal 7-9 pm, variable snacks late at night    Physical activity: exercise: Minimal               Weight control:   Wt Readings from Last 3 Encounters:  03/05/19 205 lb 12.8 oz (93.4 kg)  02/23/19 212 lb (96.2 kg)  02/03/19 211 lb 6.4 oz (95.9 kg)          Diabetes labs:  Lab Results  Component Value Date   HGBA1C 7.7 (H) 01/20/2019   HGBA1C 7.0 (H) 10/20/2018   HGBA1C 6.7 (H) 07/18/2018  Lab Results  Component Value Date   MICROALBUR 0.9 01/20/2019   LDLCALC 101 (H) 01/20/2019   CREATININE 0.94 03/03/2019    Lab Results  Component Value Date   FRUCTOSAMINE 272 03/03/2019   FRUCTOSAMINE 243 03/07/2017   FRUCTOSAMINE 274 10/30/2016     Other active problems: See review of systems    Office Visit on 03/05/2019  Component Date Value Ref Range Status  . ALT 03/05/2019 45  0 - 53 U/L Final  Lab on 03/03/2019  Component Date Value Ref Range Status  . Fructosamine 03/03/2019 272  0 - 285 umol/L Final   Comment: Published reference interval for apparently healthy subjects between age 98 and 52 is 51 - 285 umol/L and in a poorly controlled diabetic population is 228 - 563 umol/L with a mean of 396 umol/L.   [place] Kitchen Sodium 03/03/2019 136  135 - 145 mEq/L Final  . Potassium 03/03/2019 4.2  3.5 - 5.1 mEq/L  Final  . Chloride 03/03/2019 101  96 - 112 mEq/L Final  . CO2 03/03/2019 26  19 - 32 mEq/L Final  . Glucose, Bld 03/03/2019 123* 70 - 99 mg/dL Final  . BUN 03/03/2019 15  6 - 23 mg/dL Final  . Creatinine, Ser 03/03/2019 0.94  0.40 - 1.50 mg/dL Final  . Calcium 03/03/2019 9.6  8.4 - 10.5 mg/dL Final  . GFR 03/03/2019 82.90  >60.00 mL/min Final     Allergies as of 03/05/2019   No Known Allergies     Medication List       Accurate as of March 05, 2019  9:24 PM. If you have any questions, ask your nurse or doctor.        STOP taking these medications   ciprofloxacin 500 MG tablet Commonly known as: CIPRO Stopped by: Elayne Snare, MD   insulin aspart 100 UNIT/ML injection Commonly known as: NovoLOG Stopped by: Elayne Snare, MD   lisinopril 5 MG tablet Commonly known as: ZESTRIL Stopped by: Elayne Snare, MD   pioglitazone 15 MG tablet Commonly known as: ACTOS Stopped by: Elayne Snare, MD     TAKE these medications   Accu-Chek FastClix Lancets Misc 1 each by Does not apply route daily.   Accu-Chek Guide Me w/Device Kit 1 each by Does not apply route daily.   acetaminophen 325 MG tablet Commonly known as: TYLENOL Take 650 mg by mouth every 6 (six) hours as needed.   cholecalciferol 1000 units tablet Commonly known as: VITAMIN D Take 1,000 Units by mouth daily.   gabapentin 300 MG capsule Commonly known as: NEURONTIN TAKE 1 CAPSULE BY MOUTH THREE TIMES DAILY   glucose blood test strip Commonly known as: Accu-Chek Guide 1 each by Other route as needed for other. Use as instructed to check blood sugar twice daily.   insulin regular 100 units/mL injection Commonly known as: NovoLIN R ReliOn Inject 0.56 mLs (56 Units total) into the skin daily. Use Max of 56 units daily via Vgo insulin pump.   Jardiance 10 MG Tabs tablet Generic drug: empagliflozin Take 10 mg by mouth daily.   metFORMIN 750 MG 24 hr tablet Commonly known as: GLUCOPHAGE-XR Take 2 tablets by mouth once  daily   pravastatin 40 MG tablet Commonly known as: PRAVACHOL Take 40 mg by mouth daily. Take 1 tablet by mouth once daily. What changed: Another medication with the same name was removed. Continue taking this medication, and follow the directions you see here. Changed by: Elayne Snare, MD   V-Go  20 Kit APPLY V-GO 20 PUMP TO ARM ONE TIME DAILY AS DIRECTED       Allergies: No Known Allergies  Past Medical History:  Diagnosis Date  . Arthritis    bilateral hips  . Deep vein thrombosis (DVT) ([place])   . Diabetes mellitus    type II  . 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 DJD (degenerative joint disease)   . Pulmonary emboli ([place]) 02/08/2014   Date of diagnosis February 08 2014, on chest CTA Hospitalized for 3 days Had some symptoms of shortness of breath, and chest pain With intercurrent DVT of the left LE. Duration of anticoagulation: 8 months. End date 10/11/2014.  Anticoagulant: Lovenox 120 units daily Switched to Eliquis on 05/25/2014 per patient preference   . Pulmonary embolism ([place])   . Sebaceous cyst    on back of neck    Past Surgical History:  Procedure Laterality Date  . AMPUTATION Right 06/03/2015   Procedure: Right Below Knee Amputation;  Surgeon: [name], MD;  Location: [place];  Service: Orthopedics;  Laterality: Right;  . CLOSED REDUCTION WITH HUMER PIN INSERTION  1974   left hip  . HARDWARE REMOVAL Left 07/21/2014   Procedure: HARDWARE REMOVAL;  Surgeon: Ninetta Lights, MD;  Location: [place];  Service: Orthopedics;  Laterality: Left;  . JOINT REPLACEMENT  2006 (approx)   right hip replaced  . ROTATOR CUFF REPAIR  2005 (approx)   right   . 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 TOTAL HIP ARTHROPLASTY Left 07/21/2014   Procedure: TOTAL HIP ARTHROPLASTY ANTERIOR APPROACH;  Surgeon: Ninetta Lights, MD;  Location: [place];  Service: Orthopedics;  Laterality: Left;    Family History  Problem Relation Age of Onset  . Cancer Mother         breast, colon, liver  . Diabetes Father     Social History:  reports that he has never smoked. He has never used smokeless tobacco. He reports current alcohol use. He reports that he does not use drugs.  Review of Systems:   Lipids: He is on Pravachol  with previously good control of LDL  He was advised to use 80 mg daily but he is still only taking half a tablet  Lab Results  Component Value Date   CHOL 170 01/20/2019   CHOL 161 10/20/2018   CHOL 162 07/18/2018   Lab Results  Component Value Date   HDL 41.90 01/20/2019   HDL 47.70 10/20/2018   HDL 45.60 07/18/2018   Lab Results  Component Value Date   LDLCALC 101 (H) 01/20/2019   LDLCALC 98 10/20/2018   LDLCALC 102 (H) 07/18/2018   Lab Results  Component Value Date   TRIG 131.0 01/20/2019   TRIG 73.0 10/20/2018   TRIG 71.0 07/18/2018   Lab Results  Component Value Date   CHOLHDL 4 01/20/2019   CHOLHDL 3 10/20/2018   CHOLHDL 4 07/18/2018   Lab Results  Component Value Date   LDLDIRECT 91.0 07/11/2015    Has history of abnormal liver functions and ALT was higher on the last visit, he has not discussed with PCP  Lab Results  Component Value Date   ALT 45 03/05/2019     HYPERTENSION: His lisinopril has been stopped with starting Jardiance  Renal function normal   BP Readings from Last 3 Encounters:  03/05/19 122/72  02/23/19 (!) 144/89  01/23/19 122/72   Lab Results  Component  Value Date   CREATININE 0.94 03/03/2019   CREATININE 1.05 01/20/2019   CREATININE 0.94 10/20/2018      Examination:   BP 122/72 (BP Location: Left Arm, Patient Position: Sitting, Cuff Size: Normal)   Pulse 87   Ht 6' (1.829 m)   Wt 205 lb 12.8 oz (93.4 kg)   SpO2 96%   BMI 27.91 kg/m   Body mass index is 27.91 kg/m.   ASSESSMENT/ PLAN:     Diabetes type 2:  See history of present illness for detailed discussion of current diabetes management, blood sugar patterns and problems identified   A1c is recently  higher at 7.7  He is on a V-go pump along with metformin Although he has started Jardiance not clear if his blood sugars are any better but this may be related to his mistakenly using NPH instead of regular insulin in his pump He is still waiting for patient assistance for Jardiance For now we will not change any management except have him pick up his regular insulin from [facility] today instead of NPH  Left lower leg edema: This is mild and may be from venous insufficiency Needs to follow-up with his PCP; however with starting Jardiance this should improve  LIPIDS: LDL is just above 100 with taking pravastatin and will need to consider 80 mg instead of 40  To recheck liver functions today  Mild hypertension: Off treatment with using Jardiance and blood pressure is still good with no change in renal function   Needs short-term follow-up in 2 months for A1c    Patient Instructions  Check blood sugars on waking up 3 days a week  Also check blood sugars about 2 hours after meals and do this after different meals by rotation  Recommended blood sugar levels on waking up are 90-130 and about 2 hours after meal is 130-160  Please bring your blood sugar monitor to each visit, thank you       Elayne Snare 03/05/2019, 9:24 PM   Note: This office note was prepared with Dragon voice recognition system technology. Any transcriptional errors that result from this process are unintentional.

## 2019-03-05 NOTE — Patient Instructions (Addendum)
Check blood sugars on waking up 3 days a week  Also check blood sugars about 2 hours after meals and do this after different meals by rotation  Recommended blood sugar levels on waking up are 90-130 and about 2 hours after meal is 130-160  Please bring your blood sugar monitor to each visit, thank you   

## 2019-03-06 ENCOUNTER — Other Ambulatory Visit: Payer: Self-pay | Admitting: Pharmacist

## 2019-03-06 ENCOUNTER — Encounter: Payer: Self-pay | Admitting: Internal Medicine

## 2019-03-06 ENCOUNTER — Ambulatory Visit (INDEPENDENT_AMBULATORY_CARE_PROVIDER_SITE_OTHER): Payer: Medicare HMO | Admitting: Internal Medicine

## 2019-03-06 ENCOUNTER — Ambulatory Visit: Payer: Self-pay | Admitting: Pharmacist

## 2019-03-06 ENCOUNTER — Other Ambulatory Visit: Payer: Self-pay

## 2019-03-06 VITALS — BP 110/75 | HR 66 | Temp 98.3°F | Ht 72.0 in | Wt 209.1 lb

## 2019-03-06 DIAGNOSIS — H7291 Unspecified perforation of tympanic membrane, right ear: Secondary | ICD-10-CM

## 2019-03-06 DIAGNOSIS — S09301S Unspecified injury of right middle and inner ear, sequela: Secondary | ICD-10-CM

## 2019-03-06 HISTORY — DX: Unspecified perforation of tympanic membrane, right ear: H72.91

## 2019-03-06 NOTE — Patient Outreach (Signed)
[place] [facility]) Care Management  03/06/2019  Gregory May [birth year] [id number]   Patient was called to complete an online Extra Help application. Unfortunately, he said he did not have time to complete the application at the time of my call.  He asked me to call him next week.  Plan: Will call the patient back next week.  Elayne Guerin, PharmD, [place] Clinical Pharmacist [phone]

## 2019-03-06 NOTE — Progress Notes (Signed)
   CC: ear fullness  HPI:  Mr.Kapena T Kliethermes is a 58 y.o. with PMH as below. He presented two weeks ago with three months of sensation of ear fullness and was found to have otitis externa and prescribed oral ciprofloxacin 500 mg bid for 7 days. 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 He continues to have sensation of fullness and draining with a "cold" taste and sensation in the back of his throat, especially in the morning. Hearing continues to be decreased on that side. He has not put q-tips in his ears again. He endorses mild sinus headache. He denies pain in or around the ear, nausea, chills, fever, congestion, or dizziness. He previously saw [facility] ear, nose and throat in 2017 for previous rupture.   Please see A&P for assessment of the patient's acute and chronic medical conditions.   Past Medical History:  Diagnosis Date  . Arthritis    bilateral hips  . Deep vein thrombosis (DVT) ([place])   . Diabetes mellitus    type II  . 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 DJD (degenerative joint disease)   . Pulmonary emboli ([place]) 02/08/2014   Date of diagnosis February 08 2014, on chest CTA Hospitalized for 3 days Had some symptoms of shortness of breath, and chest pain With intercurrent DVT of the left LE. Duration of anticoagulation: 8 months. End date 10/11/2014.  Anticoagulant: Lovenox 120 units daily Switched to Eliquis on 05/25/2014 per patient preference   . Pulmonary embolism ([place])   . Sebaceous cyst    on back of neck   Review of Systems:   ROS negative except as noted in HPI.   Physical Exam:  Constitution: NAD, healthy appearing male HENT: left ear canal w/o erythema, TM intact; right ear canal w/o purulence or erythema, TM ruptured with surrounding scarring. Area NTTP, no mastoid tenderness, no pharyngeal erythema or exudate Eyes: no  icterus or injection Neuro: a&o, normal affect Skin: c/d/i    Vitals:   03/06/19 0940  BP: 110/75  Pulse: 66  Temp: 98.3 F (36.8 C)  TempSrc: Oral  SpO2: 97%  Weight: 209 lb 1.6 oz (94.8 kg)  Height: 6' (1.829 m)     Assessment & Plan:   See Encounters Tab for problem based charting.  Patient seen with Dr. Evette Doffing

## 2019-03-06 NOTE — Patient Instructions (Signed)
Thank you for allowing [place] to provide your care today. Today we discussed your sensation of ear fullness.   I have ordered the following labs for you:    I will call if any are abnormal.    I have made a referral to [facility], Nose and Throat to return for follow-up of your perforated ear drum.   Please follow-up with your primary care provider.   Should you have any questions or concerns please call the internal medicine clinic at [phone].

## 2019-03-06 NOTE — Assessment & Plan Note (Addendum)
On PE the TM membrane appears completely ruptured with some surrounding scarring and his previous small perforation has expanded over time. Signs of infection appear resolved at this time. He does have decreased hearing 2/2 progression of perforation and sensation of fullness and irritation without pain and will send him back to [facility] for evaluation.   - ENT referral sent to [facility] ear, nose, and throat

## 2019-03-06 NOTE — Progress Notes (Signed)
Internal Medicine Clinic Attending  I saw and evaluated the patient.  I personally confirmed the key portions of the history and exam documented by Dr. Seawell and I reviewed pertinent patient test results.  The assessment, diagnosis, and plan were formulated together and I agree with the documentation in the resident's note.     

## 2019-03-09 ENCOUNTER — Other Ambulatory Visit: Payer: Self-pay | Admitting: Pharmacist

## 2019-03-09 NOTE — Patient Outreach (Signed)
[facility] Care Management  03/09/2019  Gregory May Oct 02, 1960 [id number]   Patient was called to assess for extra help and to complete an application online if necessary. HIPAA identifiers were obtained.  After speaking with the patient, it was determined that he is over income for Extra Help.  Patient was educated on how to complete the patient assistance forms that were mailed to him.   Plan: Sharee Pimple will follow the patient for medication assistance. Follow up on medication assistance in 6-8 weeks.  Elayne Guerin, PharmD, [place] Clinical Pharmacist [phone]

## 2019-03-10 ENCOUNTER — Other Ambulatory Visit: Payer: Self-pay | Admitting: Pharmacy Technician

## 2019-03-10 NOTE — Patient Outreach (Signed)
[place] [facility]) Care Management  03/10/2019  Gregory May [birth year] [id number]    Incoming call received from patient in regards to [facility] application for [employer] for [employer]  Spoke to patient, HIPAA identifiers verified.  Patient informed he had received the applications. Discussed the questions patient had about the process. Patient was informed that he could bring his documents to the office by [facility] Denyse Amass. Informed patient that the office is closed and locked to the public due to [id number]. Informed patient I would accomodate this one occasion during the pandemic but that the patient would have to remain outside the building and must wear a mask. Patient verbalized understanding. Patient informed he would either come Tuesday or Thursday as those are the 2 days I am at the office.  Once the items are received then will be able to submit them to the respective companies.  Nalani Andreen P. Lawrnce Reyez, [place] Management [phone]

## 2019-03-11 ENCOUNTER — Other Ambulatory Visit: Payer: Self-pay | Admitting: Internal Medicine

## 2019-03-11 DIAGNOSIS — E1149 Type 2 diabetes mellitus with other diabetic neurological complication: Secondary | ICD-10-CM

## 2019-03-12 ENCOUNTER — Other Ambulatory Visit: Payer: Self-pay | Admitting: Pharmacy Technician

## 2019-03-12 NOTE — Patient Outreach (Signed)
[facility] [facility]) Care Management  03/12/2019  CORDE ANTONINI Dec 21, 1960 [id number]    Received all necessary documents and signatures from both patient and provider for [employer] patient assistance for Jardiance and [employer] patient assistance for Novolin R (previously Novolog).  Submitted completed application to [facility] via fax. Will followup with BI in 7-10 business days.  Submitted completed application to [employer] via fax. Will followup with [employer] in 3-5 business days.  Skya Mccullum P. Momo Braun, [place] Management [phone]

## 2019-03-17 ENCOUNTER — Encounter: Payer: Self-pay | Admitting: Orthopedic Surgery

## 2019-03-17 ENCOUNTER — Other Ambulatory Visit: Payer: Self-pay

## 2019-03-17 ENCOUNTER — Other Ambulatory Visit: Payer: Self-pay | Admitting: Pharmacy Technician

## 2019-03-17 ENCOUNTER — Telehealth: Payer: Self-pay

## 2019-03-17 ENCOUNTER — Ambulatory Visit: Payer: Medicare HMO | Admitting: Orthopedic Surgery

## 2019-03-17 ENCOUNTER — Ambulatory Visit (INDEPENDENT_AMBULATORY_CARE_PROVIDER_SITE_OTHER): Payer: Medicare HMO | Admitting: Orthopedic Surgery

## 2019-03-17 VITALS — Ht 72.0 in | Wt 209.0 lb

## 2019-03-17 DIAGNOSIS — L97211 Non-pressure chronic ulcer of right calf limited to breakdown of skin: Secondary | ICD-10-CM | POA: Diagnosis not present

## 2019-03-17 DIAGNOSIS — R69 Illness, unspecified: Secondary | ICD-10-CM | POA: Diagnosis not present

## 2019-03-17 DIAGNOSIS — L97521 Non-pressure chronic ulcer of other part of left foot limited to breakdown of skin: Secondary | ICD-10-CM

## 2019-03-17 DIAGNOSIS — L97421 Non-pressure chronic ulcer of left heel and midfoot limited to breakdown of skin: Secondary | ICD-10-CM | POA: Diagnosis not present

## 2019-03-17 DIAGNOSIS — B351 Tinea unguium: Secondary | ICD-10-CM | POA: Diagnosis not present

## 2019-03-17 DIAGNOSIS — Z89511 Acquired absence of right leg below knee: Secondary | ICD-10-CM

## 2019-03-17 NOTE — Telephone Encounter (Signed)
Received fax from the [employer] patient assistance foundation in regards to pt's application that was submitted. Pt has been approved to receive medicine through July 13, 2019 and a four month supply of medication is being shipped to this providers office for the pt. 

## 2019-03-17 NOTE — Patient Outreach (Signed)
[name] Care Management  03/17/2019  Gregory May 1960/10/05 [id number]    Care coordination call placed to [place] in regards to [place] application.  Spoke to [place] who informed patient had been APPROVED 03/16/2019-08/13/2019. She informed 8 vials would be delivered to the provider's address in the next 10-14 business days.  Will followup with patient in 10-14 business days to inquire if medication was received.  Ranier Coach P. Tionne Carelli, [place] Management [phone]

## 2019-03-17 NOTE — Progress Notes (Signed)
Office Visit Note   Patient: Gregory May           Date of Birth: 1960-10-21           MRN: [id number] Visit Date: 03/17/2019              Requested by: Lanelle BalHarbrecht, Lawrence, MD [address] [place],  [place] [id number] PCP: Lanelle BalHarbrecht, Lawrence, MD  Chief Complaint  Patient presents with  . Left Foot - Follow-up      HPI: Patient is a 58 year old gentleman who presents complaining of 3 painful ulcers in the plantar aspect of the left foot painful onychomycotic nails x5 which he is unable to safely trim  And an ulcer on the end of the right transtibial amputation.  Assessment & Plan: Visit Diagnoses:  1. Ulcer of left foot, limited to breakdown of skin (HCC)   2. Ulcer of left heel, limited to breakdown of skin (HCC)   3. Acquired absence of right leg below knee (HCC)   4. Onychomycosis   5. Non-pressure chronic ulcer of right calf, limited to breakdown of skin (HCC)     Plan: Ulcer is debrided x3 on the left foot nails trimmed x5 on the left foot.  Patient was given a prescription for [employer] for new socket new sleeve new prosthesis for the right lower extremity.  Recommended that he increase the ply of the stockings until he can get a new prosthesis.  Patient was also given a medical compression stocking to help with the plantar ulcers the traumatic ulcer and the swelling.  Follow-Up Instructions: Return in about 4 weeks (around 04/14/2019).   Ortho Exam  Patient is alert, oriented, no adenopathy, well-dressed, normal affect, normal respiratory effort. Examination patient has 2 ulcers beneath the metatarsal heads and 1 ulcer on the left heel.  He has venous stasis swelling in the left leg and a new abrasion ulcer over the leg that he has got more mobile and is resolved.  After informed consent a 10 blade knife was used to debride the skin and soft tissue back to the healthy viable granulation tissue the heel ulcer is 2 cm in diameter 3 mm deep in the 2 metatarsal head ulcers  are 10 mm in diameter and 1 mm deep.  Patient has thickened discolored onychomycotic nails he is unable to safely trim on his own and the nails were trimmed x5 without complications.  The ulcer on the end of the residual limb of the right transtibial amputation is 2 x 3 mm and 1 mm deep with no exposed bone or tendon.  Imaging: No results found. No images are attached to the encounter.  Labs: Lab Results  Component Value Date   HGBA1C 7.7 (H) 01/20/2019   HGBA1C 7.0 (H) 10/20/2018   HGBA1C 6.7 (H) 07/18/2018   ESRSEDRATE 56 (H) 06/01/2015   REPTSTATUS 06/07/2015 FINAL 06/01/2015   CULT  06/01/2015    NO GROWTH 5 DAYS [facility]      Lab Results  Component Value Date   ALBUMIN 3.9 01/20/2019   ALBUMIN 4.2 10/20/2018   ALBUMIN 4.1 07/18/2018    No results found for: MG Lab Results  Component Value Date   VD25OH 16 (L) 11/05/2013    No results found for: PREALBUMIN CBC EXTENDED Latest Ref Rng & Units 06/07/2015 06/07/2015 06/02/2015  WBC 4.0 - 10.5 K/uL 13.2(H) 12.2(H) 11.6(H)  RBC 4.22 - 5.81 MIL/uL 4.57 4.51 4.31  HGB 13.0 - 17.0 g/dL  13.9 13.2 12.9(L)  HCT 39.0 - 52.0 % 41.7 41.3 40.3  PLT 150 - 400 K/uL 435(H) 409(H) 386  NEUTROABS 1.7 - 7.7 K/uL 8.9(H) - -  LYMPHSABS 0.7 - 4.0 K/uL 2.5 - -     Body mass index is 28.35 kg/m.  Orders:  No orders of the defined types were placed in this encounter.  No orders of the defined types were placed in this encounter.    Procedures: No procedures performed  Clinical Data: No additional findings.  ROS:  All other systems negative, except as noted in the HPI. Review of Systems  Objective: Vital Signs: Ht 6' (1.829 m)   Wt 209 lb (94.8 kg)   BMI 28.35 kg/m   Specialty Comments:  No specialty comments available.  PMFS History: Patient Active Problem List   Diagnosis Date Noted  . 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 10/07/2018  . Essential hypertension 06/19/2018  . Peripheral neuropathy 12/19/2017  . Non-pressure chronic ulcer of right calf, limited to breakdown of skin (HCC) 04/17/2017  . Acquired absence of right leg below knee (HCC) 04/17/2017  . Carpal tunnel syndrome 03/18/2017  . Colon cancer screening 06/21/2015  . Status post below knee amputation of right lower extremity (HCC) 06/07/2015  . Diabetic polyneuropathy associated with type 2 diabetes mellitus (HCC) 12/09/2014  . Diabetes mellitus with carpal tunnel syndrome (HCC) 12/09/2014  . DJD (degenerative joint disease) of knee 07/21/2014  . Osteoarthritis, hip, bilateral 05/25/2014  . Pulmonary emboli (HCC) 02/08/2014  . DVT (deep venous thrombosis) (HCC) 02/08/2014  . Onychomycosis 10/14/2013  . Pure hypercholesterolemia 09/25/2013  . Diabetic foot ulcer (HCC) 09/15/2013  . Sebaceous cyst 08/16/2011  . Poorly controlled type 2 diabetes mellitus with complication (HCC) 09/15/1997   Past Medical History:  Diagnosis Date  . Arthritis    bilateral hips  . Deep vein thrombosis (DVT) (HCC)   . Diabetes mellitus    type II  . 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 DJD (degenerative joint disease)   . Pulmonary emboli (HCC) 02/08/2014   Date of diagnosis February 08 2014, on chest CTA Hospitalized for 3 days Had some symptoms of shortness of breath, and chest pain With intercurrent DVT of the left LE. Duration of anticoagulation: 8 months. End date 10/11/2014.  Anticoagulant: Lovenox 120 units daily Switched to Eliquis on 05/25/2014 per patient preference   . Pulmonary embolism (HCC)   . Sebaceous cyst    on back of neck    Family History  Problem Relation Age of Onset  . Cancer Mother        breast, colon, liver  . Diabetes Father     Past Surgical History:  Procedure Laterality Date  . AMPUTATION Right 06/03/2015   Procedure: Right Below Knee Amputation;  Surgeon: Nadara MustardMarcus Micheale Schlack V, MD;  [facility];  Service: Orthopedics;  Laterality: Right;  . CLOSED REDUCTION WITH HUMER PIN INSERTION  1974   left hip  . HARDWARE REMOVAL Left 07/21/2014   Procedure: HARDWARE REMOVAL;  Surgeon: Loreta Aveaniel F Murphy, MD;  [facility];  Service: Orthopedics;  Laterality: Left;  . JOINT REPLACEMENT  2006 (approx)   right hip replaced  . ROTATOR CUFF REPAIR  2005 (approx)   right   . 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 TOTAL HIP ARTHROPLASTY Left 07/21/2014   Procedure: TOTAL HIP ARTHROPLASTY ANTERIOR  APPROACH;  Surgeon: Ninetta Lights, MD;  Location: [facility];  Service: Orthopedics;  Laterality: Left;   Social History   Occupational History  . Not on file  Tobacco Use  . Smoking status: Never Smoker  . Smokeless tobacco: Never Used  Substance and Sexual Activity  . Alcohol use: Yes    Alcohol/week: 0.0 standard drinks    Comment: occasional  . Drug use: No  . Sexual activity: Never

## 2019-03-20 ENCOUNTER — Telehealth: Payer: Self-pay

## 2019-03-20 NOTE — Telephone Encounter (Signed)
I already sent a message back to the pharmacist saying that he can continue Novolin R that has been shipped but next prescription will be for NovoLog.  This is not high-priority

## 2019-03-20 NOTE — Telephone Encounter (Signed)
Dr. Dwyane Dee, do you prefer pt to stay on Novolin R or change to Novolog is approved for Novolog? Pt has already been approved for Novolin R, per J.Simcox,CPhT.

## 2019-03-20 NOTE — Telephone Encounter (Signed)
-----   Message from Jason Fila, CPhT sent at 03/19/2019  3:44 PM EDT ----- Regarding: patient assistance help.clarification Hi Derrik Mceachern, I was wondering if you could help me clarify medication order for patient.  We received [employer] patient assistance application back from the provider on 03/12/2019. Provider chose Novolin R.  Today we received another patient assistance application back from the provider . Provider chose Novolog.  Patient has already been approved for Novolin R and the medicaiton has already been shipped to the office.  Which product is correct? It looks like on 03/05/2019 he had an appointment and Novolog was dcd and patient placed on Novolin R.  Thanks, Luiz Ochoa. Simcox, [place] Management [phone]

## 2019-03-23 ENCOUNTER — Other Ambulatory Visit: Payer: Self-pay | Admitting: Pharmacy Technician

## 2019-03-23 ENCOUNTER — Telehealth: Payer: Self-pay | Admitting: Orthopedic Surgery

## 2019-03-23 ENCOUNTER — Other Ambulatory Visit: Payer: Self-pay | Admitting: Pharmacist

## 2019-03-23 NOTE — Patient Outreach (Signed)
[facility]) Care Management  03/23/2019  Gregory May October 10, 1960 [id number]   ADDENDUM  Care coordination call placed to [place] in regards to patient's application for Novolin R/Novolog.  Spoke to [place]. Caryl Pina informed if the provider wants to change the medication then the provider can fax in a reorder form with a fax cover sheet to [employer] and they will send out the new product. She informed they do not automatically send out product. The provider has to initiate the refills and  any change in regimen.  Will fax the order change to [employer] that Dr. Ronnie Derby office faxed to me on Tuesday 03/24/2019.  Gregory May, [place] Management [phone]

## 2019-03-23 NOTE — Telephone Encounter (Signed)
Patient called stating that he has developed an ulcer on his stump and wanted to know if he could put Sulfadiazine on it or should he put anything on it at all.  CB#[phone].  Thank you.

## 2019-03-23 NOTE — Patient Outreach (Signed)
[place] [facility]) Care Management  03/23/2019  Gregory May [birth year] [id number]   Received the following inbasket message from patient's endocrinologist in regards to [employer] application for Novolin R/Novolog.  Elayne Snare, MD  Lynnsey Barbara, Luiz Ochoa, CPhT        Patient was recommended Novolin R before he applied for patient assistance. In the future he will use NovoLog   Previous Messages  ----- Message -----  From: Jason Fila, CPhT  Sent: 03/19/2019  4:05 PM EDT  To: Elayne Snare, MD  Subject: FW: patient assistance help.clarification       Will submit the new order for Novolog as per Dr. Ronnie Derby clarification above when in the office on Tuesday 03/24/2019 and will followup with Novo Nordisk in 3-5 business days.  Irma Roulhac P. Clarice Bonaventure, [place] Management [phone]

## 2019-03-23 NOTE — Telephone Encounter (Signed)
Can you please call pt and make an appt with erin? Any new area should be evaluated.

## 2019-03-23 NOTE — Patient Outreach (Signed)
[place] [facility]) Care Management  03/23/2019  JOSHUA SOULIER 11/06/1960 [id number]   Patient left a message on my voicemail about a letter he received from [employer]. HIPAA identifiers were obtained.  Patient received a letter from [employer] stating that he was approved for their program.  There was some confusion about Novolog vs Novolin R.  Patient has also been approved for Jardiance through [employer].  Patient was cautioned that he may receive Novolin R as that is what his provider wrote on the initial application.  Susy Frizzle, will reach out to [employer] and fax the new application.  Plan: Susy Frizzle will follow for patient assistance. Pharmacist will follow up with the patient in 4 weeks.  Elayne Guerin, PharmD, [place] Clinical Pharmacist [phone]

## 2019-03-23 NOTE — Patient Outreach (Signed)
[place] [facility]) Care Management  03/23/2019  Gregory May 17-Apr-1961 [id number]  ADDENDUM  Care coordination call placed to BI in regards to patient's application for Jardiance.  Spoke to [place] who informed patient had been APPROVED 04/20/2019-08/13/2019. [place] informed the medication was shipped on 03/20/2019 and left her state on 03/23/2019. She informed it typically takes 7-10 business days to arrive at the patient home provided no delays due to [place].  Will followup with patient in 10-14 business days to inquire if medication was received.  Ly Wass P. Ulysess Witz, [place] Management [phone]

## 2019-03-24 ENCOUNTER — Telehealth: Payer: Self-pay

## 2019-03-24 NOTE — Telephone Encounter (Signed)
Called patient concerning scheduling appointment. Patient said his main reason for calling is to ask if should be putting sulfadiazine on his stump or should he be washing the stump with antibacterial soap? Patient said if he need to come in he will. Patient said it's starting to look better. Patient said he has an appointment with Gerald Stabs at [facility] 03/26/2019.  The number to contact patient if needed is [phone]

## 2019-03-24 NOTE — Telephone Encounter (Signed)
Pts novo nordisk assistance is here and he is aware states he will come pick it up on Thursday

## 2019-03-24 NOTE — Telephone Encounter (Signed)
I advised patient to continue to wash the area with antibacterial soap and dry the area well and to not use silvadene at this time since Dr Sharol Given did not specify this. He could also schedule an appt to come in but patient denied scheduling at this time. Patient states the area was seen by Dr Sharol Given last week on 03/17/2019 but he did not state to use silvadene cream. Patient understood to call the office if needed and make an appt to be seen about ulcer.

## 2019-03-26 ENCOUNTER — Other Ambulatory Visit: Payer: Self-pay | Admitting: Pharmacy Technician

## 2019-03-26 NOTE — Patient Outreach (Signed)
[place] [facility]) Care Management  03/26/2019  Gregory CYR 05/30/1961 [id number]    Care coordination call placed to [place] in regards to patient's change in medication order from [place] to [place].  Spoke to [place] who informed the change was made 03/26/2019 and is good thru 08/13/2019. Patient will no longer receive Novolin May unless dr request it on the refill change form.  Patient will receive 8 vials of Novolog delivered to the provider's address within the next 10-14 business days.  Will followup with patient in 10-14 business days to inquire if medication was received.  Jeyden Coffelt P. Chaske Paskett, [place] Management [phone]

## 2019-03-31 ENCOUNTER — Other Ambulatory Visit: Payer: Self-pay

## 2019-03-31 ENCOUNTER — Other Ambulatory Visit: Payer: Self-pay | Admitting: Endocrinology

## 2019-03-31 ENCOUNTER — Telehealth: Payer: Self-pay | Admitting: Endocrinology

## 2019-03-31 MED ORDER — METFORMIN HCL ER 750 MG PO TB24: 1500.0000 mg | ORAL_TABLET | Freq: Every day | ORAL | 0 refills | Status: DC

## 2019-03-31 NOTE — Telephone Encounter (Signed)
Rx was not denied, but was sent again to pharmacy.

## 2019-03-31 NOTE — Telephone Encounter (Signed)
Patient ph# [phone] states that the [facility] on [address] told patient that his RX for Metformin was denied. Patient requests to be called at the ph# above to advise as to why the denial.

## 2019-04-02 ENCOUNTER — Other Ambulatory Visit: Payer: Self-pay | Admitting: Pharmacy Technician

## 2019-04-02 NOTE — Patient Outreach (Signed)
[facility]) Care Management  04/02/2019  Gregory May Mar 11, 1961 [id number]    Unsuccessful outreach attempt made to patient in regards to [employer] application for Novolin R.  Unfortunately patient did not answer the phone, HIPAA compliant voicemail left.  Was calling patient to inquire if he had picked up the Novolin R before dr changed regimen to Novolog.  Will attempt 2nd outreach call in 3-7 business days.  Zygmund Passero P. Aleina Burgio, [place] Management [phone]

## 2019-04-03 ENCOUNTER — Telehealth: Payer: Self-pay

## 2019-04-03 NOTE — Telephone Encounter (Signed)
Received shipment of patient assistance medication from [place] patient assistance foundation. Inside shipment was 8 vials of Novolog.

## 2019-04-03 NOTE — Telephone Encounter (Signed)
Called pt and notified him of this. Pt verbalized understanding.

## 2019-04-06 ENCOUNTER — Other Ambulatory Visit: Payer: Self-pay | Admitting: Pharmacy Technician

## 2019-04-06 NOTE — Patient Outreach (Signed)
[name] Care Management  04/06/2019  Gregory May 1960/10/08 [id number]    Successful outreach call placed to patient in regards to [place] application for [employer] application for Novolin R originally but then changed to [employer].  Spoke to patient, HIPAA identifiers verified.  Patient informed he received 90 days supply of Jardiance in the mail. Discussed refill procedure with patient, where to find the phone number for [employer] and a time frame for him to call to reorder his medication.  Patient informed he has already picked up 8 vials of Novolin R from the provider's office. He also informed that Dr. Ronnie Derby nurse Olen Cordial had called him on Friday to inform him that he had 8 vials of Novolog to pick up. Informed patient that the original paperwork that was sent back to [place] from Dr. Dwyane Dee was for Novolin R and then a week later we received the paper work again and it was for [employer]. Patient informed he was going to use up the Novolin R and then use the Novolog. Per inbasket message from Dr Dwyane Dee the patient's plan aligns with the provider's plan. Patient informed he would pick up the Novolog this week. Informed patient his provider would have to call [place] to order his refill of Novolog before 07/13/2019. Patient verbalized understanding.   Patient informed he had no other questions or concerns relating to patient assistance at this time. Patient confirmed having name/number.  Will route note to [place] that patient assistance has been completed. Will remove myself from care team.  Luiz Ochoa. Inaaya Vellucci, [place] Management [phone]

## 2019-04-07 ENCOUNTER — Other Ambulatory Visit: Payer: Self-pay | Admitting: Internal Medicine

## 2019-04-07 DIAGNOSIS — E1149 Type 2 diabetes mellitus with other diabetic neurological complication: Secondary | ICD-10-CM

## 2019-04-08 ENCOUNTER — Telehealth: Payer: Self-pay | Admitting: Pharmacist

## 2019-04-08 NOTE — Patient Outreach (Signed)
[place] [facility]) Care Management  04/08/2019  Gregory May 1960/12/29 [id number]   Patient's case is being closed as he received his insulin and Jardiance from patient assistance programming at no cost.  Patient communicated understanding to Sharee Pimple Simcox about how to request refills.  Plan: Close patient's case. Send patient and PCP closure letters.  Elayne Guerin, PharmD, [place] Clinical Pharmacist [phone]

## 2019-04-13 ENCOUNTER — Ambulatory Visit: Payer: Self-pay | Admitting: Pharmacist

## 2019-04-14 ENCOUNTER — Encounter: Payer: Self-pay | Admitting: Orthopedic Surgery

## 2019-04-14 ENCOUNTER — Other Ambulatory Visit: Payer: Self-pay

## 2019-04-14 ENCOUNTER — Ambulatory Visit (INDEPENDENT_AMBULATORY_CARE_PROVIDER_SITE_OTHER): Payer: Medicare HMO | Admitting: Orthopedic Surgery

## 2019-04-14 VITALS — Ht 72.0 in | Wt 209.0 lb

## 2019-04-14 DIAGNOSIS — E11621 Type 2 diabetes mellitus with foot ulcer: Secondary | ICD-10-CM | POA: Diagnosis not present

## 2019-04-14 DIAGNOSIS — B351 Tinea unguium: Secondary | ICD-10-CM

## 2019-04-14 DIAGNOSIS — E1142 Type 2 diabetes mellitus with diabetic polyneuropathy: Secondary | ICD-10-CM

## 2019-04-14 DIAGNOSIS — L97421 Non-pressure chronic ulcer of left heel and midfoot limited to breakdown of skin: Secondary | ICD-10-CM | POA: Diagnosis not present

## 2019-04-14 DIAGNOSIS — Z89511 Acquired absence of right leg below knee: Secondary | ICD-10-CM

## 2019-04-14 DIAGNOSIS — L97521 Non-pressure chronic ulcer of other part of left foot limited to breakdown of skin: Secondary | ICD-10-CM

## 2019-04-14 DIAGNOSIS — I87322 Chronic venous hypertension (idiopathic) with inflammation of left lower extremity: Secondary | ICD-10-CM

## 2019-04-14 NOTE — Progress Notes (Signed)
Office Visit Note   Patient: Gregory May           Date of Birth: 1960/08/16           MRN: [id number] Visit Date: 04/14/2019              Requested by: Lanelle BalHarbrecht, [place], MD [address] [place],  [place] [id number] PCP: Lanelle BalHarbrecht, Lawrence, MD  Chief Complaint  Patient presents with  . Left Foot - Pain, Follow-up      HPI: Patient is a 58 year old gentleman who presents complaining of painful ulcers in the plantar aspect of the left foot. Also has an ulcer on the end of the right transtibial amputation. Has been using silvadene at night and shrinker during day, medical shrinker.   Was fitted today for a new socket and prosthesis at [facility].   Assessment & Plan: Visit Diagnoses:  1. Status post below knee amputation of right lower extremity (HCC)   2. Diabetic polyneuropathy associated with type 2 diabetes mellitus (HCC)   3. 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   4. Ulcer of left foot, limited to breakdown of skin (HCC)   5. Ulcer of left heel, limited to breakdown of skin (HCC)   6. Onychomycosis     Plan: Ulcer is debrided x 2 on the left foot. Patient will continue wearing the medical compression stocking to help with the plantar ulcers the traumatic ulcer and the swelling. Minimize weight bearing on right until healed.  Follow-Up Instructions: Return in about 4 weeks (around 05/12/2019).   Ortho Exam  Patient is alert, oriented, no adenopathy, well-dressed, normal affect, normal respiratory effort. Examination patient has 2 ulcers beneath the metatarsal heads and 1 ulcer on the left heel.  He has venous stasis swelling in the left leg. after informed consent a 10 blade knife was used to debride the skin and soft tissue back to the healthy viable granulation tissue the heel ulcer is 1 cm in diameter 3 mm deep in the  metatarsal head ulcer is 10 mm in diameter and 2 mm deep.  Patient has thickened discolored onychomycotic  nails.  The ulcer on the end of the residual limb of the right transtibial amputation is 8 x 4 mm and 1 mm deep with no exposed bone or tendon.  Imaging: No results found. No images are attached to the encounter.  Labs: Lab Results  Component Value Date   HGBA1C 7.7 (H) 01/20/2019   HGBA1C 7.0 (H) 10/20/2018   HGBA1C 6.7 (H) 07/18/2018   ESRSEDRATE 56 (H) 06/01/2015   REPTSTATUS 06/07/2015 FINAL 06/01/2015   CULT  06/01/2015    NO GROWTH 5 DAYS Performed at [facility]      Lab Results  Component Value Date   ALBUMIN 3.9 01/20/2019   ALBUMIN 4.2 10/20/2018   ALBUMIN 4.1 07/18/2018    No results found for: MG Lab Results  Component Value Date   VD25OH 16 (L) 11/05/2013    No results found for: PREALBUMIN CBC EXTENDED Latest Ref Rng & Units 06/07/2015 06/07/2015 06/02/2015  WBC 4.0 - 10.5 K/uL 13.2(H) 12.2(H) 11.6(H)  RBC 4.22 - 5.81 MIL/uL 4.57 4.51 4.31  HGB 13.0 - 17.0 g/dL 95.213.9 84.113.2 12.9(L)  HCT 39.0 - 52.0 % 41.7 41.3 40.3  PLT 150 - 400 K/uL 435(H) 409(H) 386  NEUTROABS 1.7 - 7.7 K/uL 8.9(H) - -  LYMPHSABS 0.7 - 4.0 K/uL 2.5 - -     Body mass  index is 28.35 kg/m.  Orders:  No orders of the defined types were placed in this encounter.  No orders of the defined types were placed in this encounter.    Procedures: No procedures performed  Clinical Data: No additional findings.  ROS:  All other systems negative, except as noted in the HPI. Review of Systems  Constitutional: Negative for chills and fever.  Cardiovascular: Negative for leg swelling.  Skin: Positive for wound. Negative for color change.  Neurological: Positive for numbness.    Objective: Vital Signs: Ht 6' (1.829 m)   Wt 209 lb (94.8 kg)   BMI 28.35 kg/m   Specialty Comments:  No specialty comments available.  PMFS History: Patient Active Problem List   Diagnosis Date Noted  . 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 Idiopathic chronic venous hypertension of  left lower extremity with inflammation 10/07/2018  . Essential hypertension 06/19/2018  . Peripheral neuropathy 12/19/2017  . Non-pressure chronic ulcer of right calf, limited to breakdown of skin ([place]) 04/17/2017  . Acquired absence of right leg below knee ([place]) 04/17/2017  . Carpal tunnel syndrome 03/18/2017  . Colon cancer screening 06/21/2015  . Status post below knee amputation of right lower extremity ([place]) 06/07/2015  . Diabetic polyneuropathy associated with type 2 diabetes mellitus ([place]) 12/09/2014  . Diabetes mellitus with carpal tunnel syndrome ([place]) 12/09/2014  . DJD (degenerative joint disease) of knee 07/21/2014  . Osteoarthritis, hip, bilateral 05/25/2014  . Pulmonary emboli ([place]) 02/08/2014  . DVT (deep venous thrombosis) ([place]) 02/08/2014  . Onychomycosis 10/14/2013  . Pure hypercholesterolemia 09/25/2013  . Diabetic foot ulcer ([place]) 09/15/2013  . Sebaceous cyst 08/16/2011  . Poorly controlled type 2 diabetes mellitus with complication ([place]) 86/76/1950   Past Medical History:  Diagnosis Date  . Arthritis    bilateral hips  . Deep vein thrombosis (DVT) ([place])   . Diabetes mellitus    type II  . 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 DJD (degenerative joint disease)   . Pulmonary emboli ([place]) 02/08/2014   Date of diagnosis February 08 2014, on chest CTA Hospitalized for 3 days Had some symptoms of shortness of breath, and chest pain With intercurrent DVT of the left LE. Duration of anticoagulation: 8 months. End date 10/11/2014.  Anticoagulant: Lovenox 120 units daily Switched to Eliquis on 05/25/2014 per patient preference   . Pulmonary embolism ([place])   . Sebaceous cyst    on back of neck    Family History  Problem Relation Age of Onset  . Cancer Mother        breast, colon, liver  . Diabetes Father     Past Surgical History:  Procedure Laterality Date  . AMPUTATION Right 06/03/2015   Procedure: Right Below Knee Amputation;  Surgeon: [name], MD;   Location: [place];  Service: Orthopedics;  Laterality: Right;  . CLOSED REDUCTION WITH HUMER PIN INSERTION  1974   left hip  . HARDWARE REMOVAL Left 07/21/2014   Procedure: HARDWARE REMOVAL;  Surgeon: Ninetta Lights, MD;  Location: [place];  Service: Orthopedics;  Laterality: Left;  . JOINT REPLACEMENT  2006 (approx)   right hip replaced  . ROTATOR CUFF REPAIR  2005 (approx)   right   . 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 TOTAL HIP ARTHROPLASTY Left 07/21/2014   Procedure: TOTAL HIP ARTHROPLASTY ANTERIOR APPROACH;  Surgeon: Ninetta Lights, MD;  Location: [place];  Service: Orthopedics;  Laterality:  Left;   Social History   Occupational History  . Not on file  Tobacco Use  . Smoking status: Never Smoker  . Smokeless tobacco: Never Used  Substance and Sexual Activity  . Alcohol use: Yes    Alcohol/week: 0.0 standard drinks    Comment: occasional  . Drug use: No  . Sexual activity: Never

## 2019-04-16 ENCOUNTER — Other Ambulatory Visit: Payer: Self-pay | Admitting: Pharmacist

## 2019-04-16 NOTE — Patient Outreach (Signed)
[facility]) Care Management  04/16/2019  Gregory May 11-Feb-1961 [id number]  Patient left a message on my voicemail requesting a return phone call. When I called him back, he did not answer. HIPAA compliant message was left on the patient's voicemail.  Patient's case was closed as he completed the patient assistance process.  Plan: Await a call back from the patient.  Elayne Guerin, PharmD, [place] Clinical Pharmacist [phone]

## 2019-04-17 DIAGNOSIS — Z89511 Acquired absence of right leg below knee: Secondary | ICD-10-CM | POA: Diagnosis not present

## 2019-05-12 ENCOUNTER — Ambulatory Visit: Payer: Medicare HMO | Admitting: Orthopedic Surgery

## 2019-05-12 ENCOUNTER — Other Ambulatory Visit: Payer: Self-pay

## 2019-05-12 ENCOUNTER — Other Ambulatory Visit (INDEPENDENT_AMBULATORY_CARE_PROVIDER_SITE_OTHER): Payer: Medicare HMO

## 2019-05-12 ENCOUNTER — Telehealth: Payer: Self-pay | Admitting: Endocrinology

## 2019-05-12 DIAGNOSIS — E1165 Type 2 diabetes mellitus with hyperglycemia: Secondary | ICD-10-CM | POA: Diagnosis not present

## 2019-05-12 DIAGNOSIS — Z794 Long term (current) use of insulin: Secondary | ICD-10-CM

## 2019-05-12 LAB — COMPREHENSIVE METABOLIC PANEL
ALT: 51 U/L (ref 0–53)
AST: 41 U/L — ABNORMAL HIGH (ref 0–37)
Albumin: 4.2 g/dL (ref 3.5–5.2)
Alkaline Phosphatase: 57 U/L (ref 39–117)
BUN: 13 mg/dL (ref 6–23)
CO2: 26 mEq/L (ref 19–32)
Calcium: 9.6 mg/dL (ref 8.4–10.5)
Chloride: 102 mEq/L (ref 96–112)
Creatinine, Ser: 0.91 mg/dL (ref 0.40–1.50)
GFR: 86 mL/min (ref >60.00)
Glucose, Bld: 103 mg/dL — ABNORMAL HIGH (ref 70–99)
Potassium: 4.1 mEq/L (ref 3.5–5.1)
Sodium: 138 mEq/L (ref 135–145)
Total Bilirubin: 0.3 mg/dL (ref 0.2–1.2)
Total Protein: 7.6 g/dL (ref 6.0–8.3)

## 2019-05-12 LAB — LIPID PANEL
Cholesterol: 164 mg/dL (ref 0–200)
HDL: 44 mg/dL (ref >39.00)
LDL Cholesterol: 90 mg/dL (ref 0–99)
NonHDL: 120.18
Total CHOL/HDL Ratio: 4
Triglycerides: 149 mg/dL (ref 0.0–149.0)
VLDL: 29.8 mg/dL (ref 0.0–40.0)

## 2019-05-12 LAB — HEMOGLOBIN A1C: Hgb A1c MFr Bld: 7.3 % — ABNORMAL HIGH (ref 4.6–6.5)

## 2019-05-12 NOTE — Telephone Encounter (Signed)
Please review pt concern and advise 

## 2019-05-12 NOTE — Telephone Encounter (Signed)
Patient called re: PHARM called patient and said that it was time for patient to refill Lisinopril. Patient thinks that Dr. Dwyane Dee told him to stop taking Lisinopril (patient has not taken Lisinopril for several months) while he is taking Jardiance. Please call patient at ph# [phone] to advise

## 2019-05-12 NOTE — Telephone Encounter (Signed)
Called pt and made him aware of Dr. Ronnie Derby advice. Verbalized acceptance and understanding.

## 2019-05-12 NOTE — Telephone Encounter (Signed)
He needs to tell him that it has been discontinued

## 2019-05-13 ENCOUNTER — Encounter: Payer: Self-pay | Admitting: Family

## 2019-05-13 ENCOUNTER — Ambulatory Visit (INDEPENDENT_AMBULATORY_CARE_PROVIDER_SITE_OTHER): Payer: Medicare HMO | Admitting: Family

## 2019-05-13 VITALS — Ht 72.0 in | Wt 209.0 lb

## 2019-05-13 DIAGNOSIS — E1142 Type 2 diabetes mellitus with diabetic polyneuropathy: Secondary | ICD-10-CM

## 2019-05-13 DIAGNOSIS — B351 Tinea unguium: Secondary | ICD-10-CM

## 2019-05-13 DIAGNOSIS — Z89511 Acquired absence of right leg below knee: Secondary | ICD-10-CM

## 2019-05-13 DIAGNOSIS — I87322 Chronic venous hypertension (idiopathic) with inflammation of left lower extremity: Secondary | ICD-10-CM

## 2019-05-13 NOTE — Progress Notes (Signed)
Office Visit Note   Patient: Gregory May           Date of Birth: 1961/04/17           MRN: [id number] Visit Date: 05/13/2019              Requested by: Kathi Ludwig, MD [address] [place],  [place] [address] PCP: Kathi Ludwig, MD  Chief Complaint  Patient presents with  . Right Leg - Follow-up  . Left Leg - Follow-up      HPI: Patient is a 58 year old gentleman who presents complaining of painful ulcers in the plantar aspect of the left foot. Also seen in follow up for an ulcer on the end of the right transtibial amputation.  Is specifically feeling burning in the third toe  Has picked up a new socket for his below the knee amputation has had resolution of this ulcer since being beginning to ambulate in his new prosthesis.  Assessment & Plan: Visit Diagnoses:  1. 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   2. Status post below knee amputation of right lower extremity ([place])   3. Diabetic polyneuropathy associated with type 2 diabetes mellitus ([place])   4. Onychomycosis     Plan: Ulcer is debrided x 2 on the left foot.  Minimize weight bearing on left foot.  Follow-Up Instructions: No follow-ups on file.   Ortho Exam  Patient is alert, oriented, no adenopathy, well-dressed, normal affect, normal respiratory effort. Examination patient has 2 ulcers beneath the metatarsal heads and 1 ulcer on the left heel.  He has venous stasis swelling in the left leg. after informed consent a 10 blade knife was used to debride the skin and soft tissue back to the healthy viable granulation tissue the heel ulcer is 1 cm in diameter 3 mm deep in the 3rd metatarsal head ulcer is 10 mm in diameter and 2 mm deep.  Patient has thickened discolored onychomycotic nails.  The ulcer on the end of the residual limb of the right transtibial amputation is well healed. Imaging: No results found. No images are attached to the encounter.  Labs:  Lab Results  Component Value Date   HGBA1C 7.3 (H) 05/12/2019   HGBA1C 7.7 (H) 01/20/2019   HGBA1C 7.0 (H) 10/20/2018   ESRSEDRATE 56 (H) 06/01/2015   REPTSTATUS 06/07/2015 FINAL 06/01/2015   CULT  06/01/2015    NO GROWTH 5 DAYS Performed at [facility]      Lab Results  Component Value Date   ALBUMIN 4.2 05/12/2019   ALBUMIN 3.9 01/20/2019   ALBUMIN 4.2 10/20/2018    No results found for: MG Lab Results  Component Value Date   VD25OH 16 (L) 11/05/2013    No results found for: PREALBUMIN CBC EXTENDED Latest Ref Rng & Units 06/07/2015 06/07/2015 06/02/2015  WBC 4.0 - 10.5 K/uL 13.2(H) 12.2(H) 11.6(H)  RBC 4.22 - 5.81 MIL/uL 4.57 4.51 4.31  HGB 13.0 - 17.0 g/dL 13.9 13.2 12.9(L)  HCT 39.0 - 52.0 % 41.7 41.3 40.3  PLT 150 - 400 K/uL 435(H) 409(H) 386  NEUTROABS 1.7 - 7.7 K/uL 8.9(H) - -  LYMPHSABS 0.7 - 4.0 K/uL 2.5 - -     Body mass index is 28.35 kg/m.  Orders:  No orders of the defined types were placed in this encounter.  No orders of the defined types were placed in this encounter.    Procedures: No procedures performed  Clinical Data: No additional  findings.  ROS:  All other systems negative, except as noted in the HPI. Review of Systems  Constitutional: Negative for chills and fever.  Cardiovascular: Negative for leg swelling.  Skin: Positive for wound. Negative for color change.  Neurological: Positive for numbness.    Objective: Vital Signs: Ht 6' (1.829 m)   Wt 209 lb (94.8 kg)   BMI 28.35 kg/m   Specialty Comments:  No specialty comments available.  PMFS History: Patient Active Problem List   Diagnosis Date Noted  . 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 Essential hypertension 06/19/2018  . Peripheral neuropathy 12/19/2017  . Non-pressure chronic ulcer of right calf, limited to breakdown of skin (HCC) 04/17/2017  . Acquired  absence of right leg below knee (HCC) 04/17/2017  . Carpal tunnel syndrome 03/18/2017  . Colon cancer screening 06/21/2015  . Status post below knee amputation of right lower extremity (HCC) 06/07/2015  . Diabetic polyneuropathy associated with type 2 diabetes mellitus (HCC) 12/09/2014  . Diabetes mellitus with carpal tunnel syndrome (HCC) 12/09/2014  . DJD (degenerative joint disease) of knee 07/21/2014  . Osteoarthritis, hip, bilateral 05/25/2014  . Pulmonary emboli (HCC) 02/08/2014  . DVT (deep venous thrombosis) (HCC) 02/08/2014  . Onychomycosis 10/14/2013  . Pure hypercholesterolemia 09/25/2013  . Diabetic foot ulcer (HCC) 09/15/2013  . Sebaceous cyst 08/16/2011  . Poorly controlled type 2 diabetes mellitus with complication (HCC) 09/15/1997   Past Medical History:  Diagnosis Date  . Arthritis    bilateral hips  . Deep vein thrombosis (DVT) (HCC)   . Diabetes mellitus    type II  . 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 DJD (degenerative joint disease)   . Pulmonary emboli (HCC) 02/08/2014   Date of diagnosis February 08 2014, on chest CTA Hospitalized for 3 days Had some symptoms of shortness of breath, and chest pain With intercurrent DVT of the left LE. Duration of anticoagulation: 8 months. End date 10/11/2014.  Anticoagulant: Lovenox 120 units daily Switched to Eliquis on 05/25/2014 per patient preference   . Pulmonary embolism (HCC)   . Sebaceous cyst    on back of neck    Family History  Problem Relation Age of Onset  . Cancer Mother        breast, colon, liver  . Diabetes Father     Past Surgical History:  Procedure Laterality Date  . AMPUTATION Right 06/03/2015   Procedure: Right Below Knee Amputation;  Surgeon: Nadara MustardMarcus Duda V, MD;  [facility];  Service: Orthopedics;  Laterality: Right;  . CLOSED REDUCTION WITH HUMER PIN INSERTION  1974   left hip  . HARDWARE REMOVAL Left 07/21/2014   Procedure: HARDWARE REMOVAL;  Surgeon: Loreta Aveaniel F Murphy, MD;  [facility];  Service: Orthopedics;  Laterality: Left;  . JOINT REPLACEMENT  2006 (approx)   right hip replaced  . ROTATOR CUFF REPAIR  2005 (approx)   right   . 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 TOTAL HIP ARTHROPLASTY Left 07/21/2014   Procedure: TOTAL HIP ARTHROPLASTY ANTERIOR APPROACH;  Surgeon: Loreta Aveaniel F Murphy, MD;  [facility];  Service: Orthopedics;  Laterality: Left;   Social History   Occupational History  . Not on file  Tobacco Use  . Smoking status: Never Smoker  . Smokeless tobacco: Never Used  Substance and Sexual Activity  . Alcohol use: Yes  Alcohol/week: 0.0 standard drinks    Comment: occasional  . Drug use: No  . Sexual activity: Never

## 2019-05-15 ENCOUNTER — Other Ambulatory Visit: Payer: Self-pay

## 2019-05-15 ENCOUNTER — Encounter: Payer: Self-pay | Admitting: Endocrinology

## 2019-05-15 ENCOUNTER — Ambulatory Visit (INDEPENDENT_AMBULATORY_CARE_PROVIDER_SITE_OTHER): Payer: Medicare HMO | Admitting: Endocrinology

## 2019-05-15 VITALS — BP 128/80 | HR 105 | Ht 72.0 in | Wt 207.0 lb

## 2019-05-15 DIAGNOSIS — E7800 Pure hypercholesterolemia, unspecified: Secondary | ICD-10-CM

## 2019-05-15 DIAGNOSIS — Z794 Long term (current) use of insulin: Secondary | ICD-10-CM

## 2019-05-15 DIAGNOSIS — G5600 Carpal tunnel syndrome, unspecified upper limb: Secondary | ICD-10-CM | POA: Diagnosis not present

## 2019-05-15 DIAGNOSIS — E1149 Type 2 diabetes mellitus with other diabetic neurological complication: Secondary | ICD-10-CM

## 2019-05-15 DIAGNOSIS — E1165 Type 2 diabetes mellitus with hyperglycemia: Secondary | ICD-10-CM

## 2019-05-15 DIAGNOSIS — R748 Abnormal levels of other serum enzymes: Secondary | ICD-10-CM | POA: Diagnosis not present

## 2019-05-15 DIAGNOSIS — Z23 Encounter for immunization: Secondary | ICD-10-CM

## 2019-05-15 NOTE — Patient Instructions (Signed)
Take Gapapentin 4x daily  Check blood sugars on waking up 3 days a week  Also check blood sugars about 2 hours after meals and do this after different meals by rotation  Recommended blood sugar levels on waking up are 90-130 and about 2 hours after meal is 130-160  Please bring your blood sugar monitor to each visit, thank you

## 2019-05-15 NOTE — Progress Notes (Signed)
Gregory May 58 y.o.           Reason for Appointment: follow-up   History of Present Illness   Diagnosis: Type 2 DIABETES MELITUS, date of diagnosis:  1999     Previous history: He has previously been treated with metformin and Victoza and subsequently mealtime insulin added to control postprandial hyperglycemia Overall he has been  relatively noncompliant with his diet, medications, monitoring and followup Previously would not take Victoza regularly because of cost. Has not been taking any medications for the last year and a half because of commitments to family and cost A1c had increased to 12.3% and hyperglycemia discovered when he was hospitalized for foot ulcer. Also lost 20 pounds because of hyperglycemia   For better control, compliance and inconvenience he was switched to the V-go pump on 02/21/16  Recent history:   Insulin regimen: V-go 20 unit basal with Novolin NPH insulin, mealtime boluses 12-16 breakfast --- 12 -20 units before dinner  Non-insulin hypoglycemic drugs: Metformin ER 1500 mg daily    Jardiance 10 mg daily    A1c  is now 7.3 compared to 7.7 and previously was increasing     Management, blood sugar patterns and problems identified.  He was told to use regular insulin on the last visit instead of the NPH that he had taken by mistake  Difficult to know if his blood sugars are consistently controlled since he did not bring his meter  He is reporting excellent blood sugars before and after meals and his lab glucose about 4 hours after his branch was 103  No hypoglycemia reported also  He is again trying to adjust his boluses based on how much he is eating and how much carbohydrate he has  Only rarely will run out of boluses if he is taking more boluses for snacks  Changing pump at the same time usually  He is able to start on Jardiance through patient assistance program and recent renal function is normal       Side effects from medications:  None       Monitors blood glucose: Once a day .  Glucometer: One Touch.          Blood Glucose readings by recall:   FASTING range 88-140 After dinner mostly 150-160, highest 179  Previous readings:  PRE-MEAL Fasting Lunch Dinner Bedtime Overall  Glucose range:  139-171   72-215  140-277   Mean/median:      155    Meals:  usually 2 meals per day usually.  breakfast at 12 noon, evening meal 7-9 pm, variable snacks late at night    Physical activity: exercise: Minimal               Weight control:   Wt Readings from Last 3 Encounters:  05/15/19 207 lb (93.9 kg)  05/13/19 209 lb (94.8 kg)  04/14/19 209 lb (94.8 kg)          Diabetes labs:  Lab Results  Component Value Date   HGBA1C 7.3 (H) 05/12/2019   HGBA1C 7.7 (H) 01/20/2019   HGBA1C 7.0 (H) 10/20/2018   Lab Results  Component Value Date   MICROALBUR 0.9 01/20/2019   LDLCALC 90 05/12/2019   CREATININE 0.91 05/12/2019    Lab Results  Component Value Date   FRUCTOSAMINE 272 03/03/2019   FRUCTOSAMINE 243 03/07/2017   FRUCTOSAMINE 274 10/30/2016     Other active problems: See review of systems    Lab on 05/12/2019  Component Date Value Ref Range Status  . Cholesterol 05/12/2019 164  0 - 200 mg/dL Final   ATP III Classification       Desirable:  < 200 mg/dL               Borderline High:  200 - 239 mg/dL          High:  > = 240 mg/dL  . Triglycerides 05/12/2019 149.0  0.0 - 149.0 mg/dL Final   Normal:  <150 mg/dLBorderline High:  150 - 199 mg/dL  . HDL 05/12/2019 44.00  >39.00 mg/dL Final  . VLDL 05/12/2019 29.8  0.0 - 40.0 mg/dL Final  . LDL Cholesterol 05/12/2019 90  0 - 99 mg/dL Final  . Total CHOL/HDL Ratio 05/12/2019 4   Final                  Men          Women1/2 Average Risk     3.4          3.3Average Risk          5.0          4.42X Average Risk          9.6          7.13X Average Risk          15.0          11.0                      . NonHDL 05/12/2019 120.18   Final   NOTE:  Non-HDL goal should  be 30 mg/dL higher than patient's LDL goal (i.e. LDL goal of < 70 mg/dL, would have non-HDL goal of < 100 mg/dL)  . Sodium 05/12/2019 138  135 - 145 mEq/L Final  . Potassium 05/12/2019 4.1  3.5 - 5.1 mEq/L Final  . Chloride 05/12/2019 102  96 - 112 mEq/L Final  . CO2 05/12/2019 26  19 - 32 mEq/L Final  . Glucose, Bld 05/12/2019 103* 70 - 99 mg/dL Final  . BUN 05/12/2019 13  6 - 23 mg/dL Final  . Creatinine, Ser 05/12/2019 0.91  0.40 - 1.50 mg/dL Final  . Total Bilirubin 05/12/2019 0.3  0.2 - 1.2 mg/dL Final  . Alkaline Phosphatase 05/12/2019 57  39 - 117 U/L Final  . AST 05/12/2019 41* 0 - 37 U/L Final  . ALT 05/12/2019 51  0 - 53 U/L Final  . Total Protein 05/12/2019 7.6  6.0 - 8.3 g/dL Final  . Albumin 05/12/2019 4.2  3.5 - 5.2 g/dL Final  . Calcium 05/12/2019 9.6  8.4 - 10.5 mg/dL Final  . GFR 05/12/2019 86.00  >60.00 mL/min Final  . Hgb A1c MFr Bld 05/12/2019 7.3* 4.6 - 6.5 % Final   Glycemic Control Guidelines for People with Diabetes:Non Diabetic:  <6%Goal of Therapy: <7%Additional Action Suggested:  >8%      Allergies as of 05/15/2019   No Known Allergies     Medication List       Accurate as of May 15, 2019  2:38 PM. If you have any questions, ask your nurse or doctor.        Accu-Chek FastClix Lancets Misc 1 each by Does not apply route daily.   Accu-Chek Guide Me w/Device Kit 1 each by Does not apply route daily.   acetaminophen 325 MG tablet Commonly known as: TYLENOL Take 650 mg by mouth every 6 (six) hours as  needed.   cholecalciferol 1000 units tablet Commonly known as: VITAMIN D Take 1,000 Units by mouth daily.   gabapentin 300 MG capsule Commonly known as: NEURONTIN TAKE 1 CAPSULE BY MOUTH THREE TIMES DAILY   glucose blood test strip Commonly known as: Accu-Chek Guide 1 each by Other route as needed for other. Use as instructed to check blood sugar twice daily.   insulin regular 100 units/mL injection Commonly known as: NovoLIN R ReliOn Inject  0.56 mLs (56 Units total) into the skin daily. Use Max of 56 units daily via Vgo insulin pump.   Jardiance 10 MG Tabs tablet Generic drug: empagliflozin Take 10 mg by mouth daily.   metFORMIN 750 MG 24 hr tablet Commonly known as: GLUCOPHAGE-XR Take 2 tablets (1,500 mg total) by mouth daily.   pravastatin 40 MG tablet Commonly known as: PRAVACHOL Take 40 mg by mouth daily. Take 1 tablet by mouth once daily.   V-Go 20 Kit APPLY V-GO 20 PUMP TO ARM ONE TIME DAILY AS DIRECTED       Allergies: No Known Allergies  Past Medical History:  Diagnosis Date  . Arthritis    bilateral hips  . Deep vein thrombosis (DVT) ([place])   . Diabetes mellitus    type II  . 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 DJD (degenerative joint disease)   . Pulmonary emboli ([place]) 02/08/2014   Date of diagnosis February 08 2014, on chest CTA Hospitalized for 3 days Had some symptoms of shortness of breath, and chest pain With intercurrent DVT of the left LE. Duration of anticoagulation: 8 months. End date 10/11/2014.  Anticoagulant: Lovenox 120 units daily Switched to Eliquis on 05/25/2014 per patient preference   . Pulmonary embolism ([place])   . Sebaceous cyst    on back of neck    Past Surgical History:  Procedure Laterality Date  . AMPUTATION Right 06/03/2015   Procedure: Right Below Knee Amputation;  Surgeon: [name], MD;  Location: [place];  Service: Orthopedics;  Laterality: Right;  . CLOSED REDUCTION WITH HUMER PIN INSERTION  1974   left hip  . HARDWARE REMOVAL Left 07/21/2014   Procedure: HARDWARE REMOVAL;  Surgeon: Ninetta Lights, MD;  Location: [facility];  Service: Orthopedics;  Laterality: Left;  . JOINT REPLACEMENT  2006 (approx)   right hip replaced  . ROTATOR CUFF REPAIR  2005 (approx)   right   . 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 TOTAL HIP ARTHROPLASTY Left 07/21/2014   Procedure: TOTAL HIP ARTHROPLASTY ANTERIOR APPROACH;  Surgeon: Ninetta Lights, MD;  Location: [place];  Service: Orthopedics;  Laterality: Left;    Family History  Problem Relation Age of Onset  . Cancer Mother        breast, colon, liver  . Diabetes Father     Social History:  reports that he has never smoked. He has never used smokeless tobacco. He reports current alcohol use. He reports that he does not use drugs.  Review of Systems:   Lipids: He is on Pravachol  with recently good control of LDL  Diet may be a little better  Lab Results  Component Value Date   CHOL 164 05/12/2019   CHOL 170 01/20/2019   CHOL 161 10/20/2018   Lab Results  Component Value Date   HDL 44.00 05/12/2019   HDL 41.90 01/20/2019   HDL 47.70 10/20/2018   Lab Results  Component Value Date  LDLCALC 90 05/12/2019   LDLCALC 101 (H) 01/20/2019   LDLCALC 98 10/20/2018   Lab Results  Component Value Date   TRIG 149.0 05/12/2019   TRIG 131.0 01/20/2019   TRIG 73.0 10/20/2018   Lab Results  Component Value Date   CHOLHDL 4 05/12/2019   CHOLHDL 4 01/20/2019   CHOLHDL 3 10/20/2018   Lab Results  Component Value Date   LDLDIRECT 91.0 07/11/2015    Has history of abnormal liver functions, higher previously but now improving, likely this is from fatty liver  Lab Results  Component Value Date   ALT 51 05/12/2019     HYPERTENSION: His lisinopril has been stopped with starting Jardiance Blood pressure is still consistently normal Renal function normal   BP Readings from Last 3 Encounters:  05/15/19 128/80  03/06/19 110/75  03/05/19 122/72   Lab Results  Component Value Date   CREATININE 0.91 05/12/2019   CREATININE 0.94 03/03/2019   CREATININE 1.05 01/20/2019   NEUROPATHY:  He has had some burning in the left foot occasionally However he is having discomfort, paresthesia, some numbness and difficulty using his right hand which is spreading to the left also Sometimes this will be better with dangling his hand He has not followed up with his PCP Currently taking  gabapentin 2-3 times a day He is not sure if he gets drowsy with taking all 3 tablets a day Not clear if he has had studies for carpal tunnel in the past, symptoms are mostly during the day   Examination:   BP 128/80   Pulse (!) 105   Ht 6' (1.829 m)   Wt 207 lb (93.9 kg)   SpO2 96%   BMI 28.07 kg/m   Body mass index is 28.07 kg/m.   ASSESSMENT/ PLAN:     Diabetes type 2:  See history of present illness for detailed discussion of current diabetes management, blood sugar patterns and problems identified   A1c is improving at 7.3  He is on a V-go pump along with metformin and Jardiance  Unable to review his blood sugars as he did not bring his monitor but he reports good readings He will continue same regimen Since he has NovoLog on the patient assistance program he can switch to this now instead of doing regular insulin He will try to cut back on snacks like cookies including for benefit on lipids  NEUROPATHY/carpal tunnel syndrome Please to follow-up with PCP In the meantime he can increase his gabapentin by 1-2 capsules daily as tolerated He will discuss with PCP and if he cannot tolerate higher dose of gabapentin may be candidate for Lyrica or Cymbalta  LIPIDS: LDL is now below 100 likely with some improvement in his diet Taking 40 pravastatin and will continue same dose Discussed low saturated fat diet  Abnormal liver functions: Appear to be improving and this may be the result of improved blood sugar control indicating fatty liver, will need to continue following.  He will discuss with PCP also  Follow-up in 3 months  Influenza vaccine given with information provided  Total visit time for evaluation and management of multiple problems and counseling =25 minutes There are no Patient Instructions on file for this visit.    Elayne Snare 05/15/2019, 2:38 PM   Note: This office note was prepared with Dragon voice recognition system technology. Any transcriptional  errors that result from this process are unintentional.

## 2019-05-21 ENCOUNTER — Other Ambulatory Visit: Payer: Self-pay | Admitting: Pharmacy Technician

## 2019-05-21 NOTE — Patient Outreach (Signed)
[facility]) Care Management  05/21/2019  Gregory May Sep 12, 1960 [id number]    Unsuccessful outreach call placed to patient in regards to a voicemail he left me concerning a letter he received from [facility] [place] and some paperwork he received from [facility].  Unfortunately patient did not answer the phone.  HIPAA compliant voicemail left.  Will await a return call.  Blaiden Werth P. Puneet Masoner, [place] Management [phone]

## 2019-05-21 NOTE — Patient Outreach (Signed)
[facility]) Care Management  05/21/2019  Gregory May Jan 13, 1961 [id number]    Incoming call received from patient who was reutnring my call concerning questions he had about a letter and application he had received in the mail.  Spoke to patient, HIPAA identifiers verified.  Patient informed he received a letter from [facility] stating that his case was closed and was inquiring if he could still call me. Informed patient that the letter was a formality and to please outreach me with any questions he may have concerning patient assistance.  Patient also informed he received an application for re enrollment for BI and was inquiring as to who he needed to send the application to. Informed patient to hold on to the application and that we would be outreaching him towards mid November to early December. Patient verbalized understanding and appreciated the return call.  Gregory May, [place] Management [phone]

## 2019-05-26 ENCOUNTER — Telehealth: Payer: Self-pay

## 2019-05-26 NOTE — Telephone Encounter (Signed)
Called pt and informed him that the podiatrist must first fax [place] paperwork for the doctor to complete and then we fax it back to them.

## 2019-05-26 NOTE — Telephone Encounter (Signed)
Patient called in wanting to know if he can request diabetic shoes or if there was a form that could be filled out for the shoes.  Patient says he has an appt to be fitted for his shoes on 06/02/2019. Patient needs the form completed in order to ger the shoes

## 2019-06-10 ENCOUNTER — Ambulatory Visit: Payer: Medicare HMO | Admitting: Family

## 2019-06-15 DIAGNOSIS — E113293 Type 2 diabetes mellitus with mild nonproliferative diabetic retinopathy without macular edema, bilateral: Secondary | ICD-10-CM | POA: Diagnosis not present

## 2019-06-15 DIAGNOSIS — H5213 Myopia, bilateral: Secondary | ICD-10-CM | POA: Diagnosis not present

## 2019-06-15 LAB — HM DIABETES EYE EXAM

## 2019-06-18 ENCOUNTER — Other Ambulatory Visit: Payer: Self-pay | Admitting: Internal Medicine

## 2019-06-18 DIAGNOSIS — E1149 Type 2 diabetes mellitus with other diabetic neurological complication: Secondary | ICD-10-CM

## 2019-06-18 NOTE — Telephone Encounter (Signed)
Next appt scheduled 12/2 with PCP. 

## 2019-06-18 NOTE — Telephone Encounter (Signed)
Needs refill on gabapentin (NEURONTIN) 300 MG capsule ;pt contact [place], [place]

## 2019-06-21 ENCOUNTER — Other Ambulatory Visit: Payer: Self-pay | Admitting: Pharmacist

## 2019-06-23 ENCOUNTER — Other Ambulatory Visit: Payer: Self-pay

## 2019-06-23 ENCOUNTER — Ambulatory Visit (INDEPENDENT_AMBULATORY_CARE_PROVIDER_SITE_OTHER): Payer: Medicare HMO | Admitting: Orthopedic Surgery

## 2019-06-23 ENCOUNTER — Encounter: Payer: Self-pay | Admitting: Orthopedic Surgery

## 2019-06-23 VITALS — Ht 72.0 in | Wt 207.0 lb

## 2019-06-23 DIAGNOSIS — E1165 Type 2 diabetes mellitus with hyperglycemia: Secondary | ICD-10-CM | POA: Diagnosis not present

## 2019-06-23 DIAGNOSIS — B351 Tinea unguium: Secondary | ICD-10-CM

## 2019-06-23 DIAGNOSIS — Z89511 Acquired absence of right leg below knee: Secondary | ICD-10-CM

## 2019-06-23 NOTE — Telephone Encounter (Signed)
[facility] called to verify information and get send verification diabetic foot exam and orthotics.   Call back number [phone]

## 2019-06-23 NOTE — Progress Notes (Signed)
Office Visit Note   Patient: Gregory May           Date of Birth: 1960/09/11           MRN: [id number] Visit Date: 06/23/2019              Requested by: Kathi Ludwig, MD [address] [place],  [place] [address] PCP: Kathi Ludwig, MD  Chief Complaint  Patient presents with  . Right Leg - Follow-up    Hx BKA   . Left Foot - Follow-up      HPI: Patient is here for follow up on an ulcer at the end of his right transtibial amputation  And for trimming of callus on the plantar aspect of his 3rd metatarsal head and heel. Since receiving his new prosthetic the ulcer on his stump has completley resolved. He feels the heel callus has improved but has some pain over his forefoot from the callus beneath his metatarsal. Also asking to have his nails trimmed  Assessment & Plan: Visit Diagnoses: No diagnosis found.  Plan: Follow up 4 weeks  Follow-Up Instructions: No follow-ups on file.   Ortho Exam  Patient is alert, oriented, no adenopathy, well-dressed, normal affect, normal respiratory effort. Right BKA . Ulceration has resolved. Skin is healthy . No erythema swelling  Left foot: DP pulse palpable. Thickened nails. Callus beneath 3rd met head.3cm in diameter Small pinpoint central opening . No drainage, no odor does not probe to bone. No cellulitis. Very small callus plantar heel  Imaging: No results found. No images are attached to the encounter.  Labs: Lab Results  Component Value Date   HGBA1C 7.3 (H) 05/12/2019   HGBA1C 7.7 (H) 01/20/2019   HGBA1C 7.0 (H) 10/20/2018   ESRSEDRATE 56 (H) 06/01/2015   REPTSTATUS 06/07/2015 FINAL 06/01/2015   CULT  06/01/2015    NO GROWTH 5 DAYS Performed at [facility]      Lab Results  Component Value Date   ALBUMIN 4.2 05/12/2019   ALBUMIN 3.9 01/20/2019   ALBUMIN 4.2 10/20/2018    No results found for: MG Lab Results  Component Value Date   VD25OH 16 (L) 11/05/2013    No results found for:  PREALBUMIN CBC EXTENDED Latest Ref Rng & Units 06/07/2015 06/07/2015 06/02/2015  WBC 4.0 - 10.5 K/uL 13.2(H) 12.2(H) 11.6(H)  RBC 4.22 - 5.81 MIL/uL 4.57 4.51 4.31  HGB 13.0 - 17.0 g/dL 13.9 13.2 12.9(L)  HCT 39.0 - 52.0 % 41.7 41.3 40.3  PLT 150 - 400 K/uL 435(H) 409(H) 386  NEUTROABS 1.7 - 7.7 K/uL 8.9(H) - -  LYMPHSABS 0.7 - 4.0 K/uL 2.5 - -     Body mass index is 28.07 kg/m.  Orders:  No orders of the defined types were placed in this encounter.  No orders of the defined types were placed in this encounter.    Procedures: Left foot callus beneath met head and heel trimmed to healthy soft tissue.  nsils trimmed x 5 Clinical Data: No additional findings.  ROS:  All other systems negative, except as noted in the HPI. Review of Systems  Objective: Vital Signs: Ht 6' (1.829 m)   Wt 207 lb (93.9 kg)   BMI 28.07 kg/m   Specialty Comments:  No specialty comments available.  PMFS History: Patient Active Problem List   Diagnosis Date Noted  . 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 Essential  hypertension 06/19/2018  . Peripheral neuropathy 12/19/2017  . Non-pressure chronic ulcer of right calf, limited to breakdown of skin ([place]) 04/17/2017  . Acquired absence of right leg below knee ([place]) 04/17/2017  . Carpal tunnel syndrome 03/18/2017  . Colon cancer screening 06/21/2015  . Status post below knee amputation of right lower extremity ([place]) 06/07/2015  . Diabetic polyneuropathy associated with type 2 diabetes mellitus ([place]) 12/09/2014  . Diabetes mellitus with carpal tunnel syndrome ([place]) 12/09/2014  . DJD (degenerative joint disease) of knee 07/21/2014  . Osteoarthritis, hip, bilateral 05/25/2014  . Pulmonary emboli ([place]) 02/08/2014  . DVT (deep venous thrombosis) ([place]) 02/08/2014  . Onychomycosis 10/14/2013  . Pure hypercholesterolemia 09/25/2013  . Diabetic foot  ulcer ([place]) 09/15/2013  . Sebaceous cyst 08/16/2011  . Poorly controlled type 2 diabetes mellitus with complication ([place]) 03/47/4259   Past Medical History:  Diagnosis Date  . Arthritis    bilateral hips  . Deep vein thrombosis (DVT) ([place])   . Diabetes mellitus    type II  . 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 DJD (degenerative joint disease)   . Pulmonary emboli ([place]) 02/08/2014   Date of diagnosis February 08 2014, on chest CTA Hospitalized for 3 days Had some symptoms of shortness of breath, and chest pain With intercurrent DVT of the left LE. Duration of anticoagulation: 8 months. End date 10/11/2014.  Anticoagulant: Lovenox 120 units daily Switched to Eliquis on 05/25/2014 per patient preference   . Pulmonary embolism ([place])   . Sebaceous cyst    on back of neck    Family History  Problem Relation Age of Onset  . Cancer Mother        breast, colon, liver  . Diabetes Father     Past Surgical History:  Procedure Laterality Date  . AMPUTATION Right 06/03/2015   Procedure: Right Below Knee Amputation;  Surgeon: [name], MD;  Location: [place];  Service: Orthopedics;  Laterality: Right;  . CLOSED REDUCTION WITH HUMER PIN INSERTION  1974   left hip  . HARDWARE REMOVAL Left 07/21/2014   Procedure: HARDWARE REMOVAL;  Surgeon: Ninetta Lights, MD;  Location: [place];  Service: Orthopedics;  Laterality: Left;  . JOINT REPLACEMENT  2006 (approx)   right hip replaced  . ROTATOR CUFF REPAIR  2005 (approx)   right   . 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 TOTAL HIP ARTHROPLASTY Left 07/21/2014   Procedure: TOTAL HIP ARTHROPLASTY ANTERIOR APPROACH;  Surgeon: Ninetta Lights, MD;  Location: [place];  Service: Orthopedics;  Laterality: Left;   Social History   Occupational History  . Not on file  Tobacco Use  . Smoking status: Never Smoker  . Smokeless tobacco: Never Used  Substance and Sexual Activity  . Alcohol use: Yes    Alcohol/week: 0.0 standard drinks     Comment: occasional  . Drug use: No  . Sexual activity: Never

## 2019-07-03 ENCOUNTER — Other Ambulatory Visit: Payer: Self-pay | Admitting: Pharmacy Technician

## 2019-07-03 ENCOUNTER — Encounter: Payer: Medicare HMO | Admitting: Internal Medicine

## 2019-07-03 NOTE — Patient Outreach (Signed)
[facility]) Care Management  07/03/2019  Gregory May 1961/01/28 [id number]  Successful outreach call placed to patient in regards to voicemail message requesting a return call.  Spoke to patient, HIPAA identifiers verified.  Patient informed he had about a 2 weeks supply of Jardiance remaining and inquired about how to place his refills. Informed patient to call the patient assistance company to order his refill. Inquired if patient needed the phone number and patient informed he had the number.  Patient inquired about 2021 patient assistance. Informed patient we would be trying to outreach patients proactively for 2021 re enrollments. Informed patient if he has not heard from me towards the end of December or first of January to outreach me again. Patient was agreeable and verbalized understanding.  Will also route note to [place] for clinical appropriateness in regards to 2021 re enrollment with patient assistance.  Gregory May, [place] Management [phone]

## 2019-07-06 ENCOUNTER — Telehealth: Payer: Self-pay | Admitting: Pharmacist

## 2019-07-07 ENCOUNTER — Other Ambulatory Visit: Payer: Self-pay | Admitting: Pharmacy Technician

## 2019-07-07 NOTE — Patient Outreach (Signed)
[facility]) Care Management  07/07/2019  Gregory May [birth year] [id number]                                        Medication Assistance Referral  Referral From: [place]  Medication/Company: Vania Rea  / BI Patient application portion:  Mailed Provider application portion: Faxed  to Dr. Elayne Snare Provider address/fax verified via: Office website  Medication/Company: [employer] / [employer] Patient application portion:  Mailed Provider application portion: Faxed  to Dr. Elayne Snare after 08/14/2019  Provider address/fax verified via: Office website  This is for 2021 patient assistance.   Provider portion for [employer] will be faxed in 2021 as re enrollment applications have to be dated in 2021 per representatives at [employer]  Follow up:  Will follow up with patient in 20-30  days to confirm application(s) have been received.  Kialee Kham P. Kailyn Vanderslice, [place] Management [phone]

## 2019-07-15 ENCOUNTER — Encounter: Payer: Self-pay | Admitting: Internal Medicine

## 2019-07-15 ENCOUNTER — Other Ambulatory Visit: Payer: Self-pay

## 2019-07-15 ENCOUNTER — Ambulatory Visit (INDEPENDENT_AMBULATORY_CARE_PROVIDER_SITE_OTHER): Payer: Medicare HMO | Admitting: Internal Medicine

## 2019-07-15 VITALS — BP 141/95 | HR 78 | Temp 98.0°F | Ht 72.0 in | Wt 210.0 lb

## 2019-07-15 DIAGNOSIS — Z794 Long term (current) use of insulin: Secondary | ICD-10-CM | POA: Diagnosis not present

## 2019-07-15 DIAGNOSIS — H7291 Unspecified perforation of tympanic membrane, right ear: Secondary | ICD-10-CM

## 2019-07-15 DIAGNOSIS — E1165 Type 2 diabetes mellitus with hyperglycemia: Secondary | ICD-10-CM

## 2019-07-15 DIAGNOSIS — E11621 Type 2 diabetes mellitus with foot ulcer: Secondary | ICD-10-CM

## 2019-07-15 DIAGNOSIS — M79672 Pain in left foot: Secondary | ICD-10-CM | POA: Diagnosis not present

## 2019-07-15 DIAGNOSIS — Z79899 Other long term (current) drug therapy: Secondary | ICD-10-CM | POA: Diagnosis not present

## 2019-07-15 DIAGNOSIS — Z9641 Presence of insulin pump (external) (internal): Secondary | ICD-10-CM

## 2019-07-15 DIAGNOSIS — E118 Type 2 diabetes mellitus with unspecified complications: Secondary | ICD-10-CM

## 2019-07-15 DIAGNOSIS — I10 Essential (primary) hypertension: Secondary | ICD-10-CM

## 2019-07-15 DIAGNOSIS — E1142 Type 2 diabetes mellitus with diabetic polyneuropathy: Secondary | ICD-10-CM | POA: Diagnosis not present

## 2019-07-15 DIAGNOSIS — L97529 Non-pressure chronic ulcer of other part of left foot with unspecified severity: Secondary | ICD-10-CM | POA: Diagnosis not present

## 2019-07-15 DIAGNOSIS — M6208 Separation of muscle (nontraumatic), other site: Secondary | ICD-10-CM | POA: Diagnosis not present

## 2019-07-15 NOTE — Progress Notes (Signed)
   CC: Diabetes and hypertension with new concerns of burning left foot pain  HPI:[name] is a 58 y.o. male who presents for evaluation of the above. Please see individual problem based A/P for details.  Depression, PHQ-9: Based on the patients    Office Visit from 07/15/2019 in [place]  PHQ-9 Total Score  2     score we have decided to monitor.  Past Medical History:  Diagnosis Date  . Arthritis    bilateral hips  . Deep vein thrombosis (DVT) ([place])   . Diabetes mellitus    type II  . 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 DJD (degenerative joint disease)   . Pulmonary emboli ([place]) 02/08/2014   Date of diagnosis February 08 2014, on chest CTA Hospitalized for 3 days Had some symptoms of shortness of breath, and chest pain With intercurrent DVT of the left LE. Duration of anticoagulation: 8 months. End date 10/11/2014.  Anticoagulant: Lovenox 120 units daily Switched to Eliquis on 05/25/2014 per patient preference   . Pulmonary embolism ([place])   . Sebaceous cyst    on back of neck   Review of Systems:  ROS negative except as per HPI.  Physical Exam: Vitals:   07/15/19 1503  BP: (!) 141/95  Pulse: 78  Temp: 98 F (36.7 C)  TempSrc: Oral  SpO2: 99%  Weight: 210 lb (95.3 kg)  Height: 6' (1.829 m)   General: A/O x4, in no acute distress, afebrile, nondiaphoretic HEENT: PEERL, EMO intact, left tympanic membrane unremarkable clean with clear light reflex.  Right tympanic membrane described as per HPI. Cardio: RRR, no mrg's  Pulmonary: CTA bilaterally, no wheezing or crackles  Abdomen: Bowel sounds normal, soft, nontender  MSK: Please see HPI for details Psych: Appropriate affect, not depressed in appearance, engages well  Assessment & Plan:   See Encounters Tab for problem based charting.  Patient discussed with Dr. Angelia Mould

## 2019-07-15 NOTE — Patient Instructions (Addendum)
FOLLOW-UP INSTRUCTIONS When: 3 months or sooner if needed What to bring: All of your medications  I have not made any changes to your medications today. I have obtained several labs today. We will need to follow the xray and labs to determine if further therapy is indicated. Please keep a close eye on his foot and observe the skin with a mirror twice daily. If he noticed worsening redness of the skin, pain, swelling or other concerning features please notify us right away we will treat with antibiotics. Your point-of-care ABIs today demonstrated a left ankle brachial index of 1.11 which is in the normal range.  Thank you for your visit to the [facility] today. If you have any questions or concerns please call us at [phone].

## 2019-07-16 ENCOUNTER — Encounter: Payer: Self-pay | Admitting: Internal Medicine

## 2019-07-16 ENCOUNTER — Other Ambulatory Visit: Payer: Self-pay

## 2019-07-16 ENCOUNTER — Ambulatory Visit (HOSPITAL_COMMUNITY)
Admission: RE | Admit: 2019-07-16 | Discharge: 2019-07-16 | Disposition: A | Discharge status: Home / Self Care | Payer: Medicare HMO | Source: Ambulatory Visit | Attending: Internal Medicine | Admitting: Internal Medicine

## 2019-07-16 DIAGNOSIS — E11621 Type 2 diabetes mellitus with foot ulcer: Secondary | ICD-10-CM | POA: Insufficient documentation

## 2019-07-16 DIAGNOSIS — E1142 Type 2 diabetes mellitus with diabetic polyneuropathy: Secondary | ICD-10-CM | POA: Diagnosis not present

## 2019-07-16 DIAGNOSIS — M6208 Separation of muscle (nontraumatic), other site: Secondary | ICD-10-CM | POA: Insufficient documentation

## 2019-07-16 DIAGNOSIS — L97529 Non-pressure chronic ulcer of other part of left foot with unspecified severity: Secondary | ICD-10-CM | POA: Insufficient documentation

## 2019-07-16 LAB — CBC WITH DIFFERENTIAL/PLATELET
Basophils Absolute: 0.1 10*3/uL (ref 0.0–0.2)
Basos: 0 %
EOS (ABSOLUTE): 0.1 10*3/uL (ref 0.0–0.4)
Eos: 1 %
Hematocrit: 47.4 % (ref 37.5–51.0)
Hemoglobin: 16.2 g/dL (ref 13.0–17.7)
Immature Grans (Abs): 0 10*3/uL (ref 0.0–0.1)
Immature Granulocytes: 0 %
Lymphocytes Absolute: 2.2 10*3/uL (ref 0.7–3.1)
Lymphs: 19 %
MCH: 31 pg (ref 26.6–33.0)
MCHC: 34.2 g/dL (ref 31.5–35.7)
MCV: 91 fL (ref 79–97)
Monocytes Absolute: 1.2 10*3/uL — ABNORMAL HIGH (ref 0.1–0.9)
Monocytes: 11 %
Neutrophils Absolute: 8.2 10*3/uL — ABNORMAL HIGH (ref 1.4–7.0)
Neutrophils: 69 %
Platelets: 383 10*3/uL (ref 150–450)
RBC: 5.23 x10E6/uL (ref 4.14–5.80)
RDW: 13 % (ref 11.6–15.4)
WBC: 11.8 10*3/uL — ABNORMAL HIGH (ref 3.4–10.8)

## 2019-07-16 LAB — BMP8+ANION GAP
Anion Gap: 13 mmol/L (ref 10.0–18.0)
BUN/Creatinine Ratio: 15 (ref 9–20)
BUN: 14 mg/dL (ref 6–24)
CO2: 27 mmol/L (ref 20–29)
Calcium: 9.6 mg/dL (ref 8.7–10.2)
Chloride: 99 mmol/L (ref 96–106)
Creatinine, Ser: 0.93 mg/dL (ref 0.76–1.27)
GFR calc Af Amer: 106 mL/min/{1.73_m2} (ref >59)
GFR calc non Af Amer: 91 mL/min/{1.73_m2} (ref >59)
Glucose: 62 mg/dL — ABNORMAL LOW (ref 65–99)
Potassium: 4.1 mmol/L (ref 3.5–5.2)
Sodium: 139 mmol/L (ref 134–144)

## 2019-07-16 LAB — C-REACTIVE PROTEIN: CRP: 48 mg/L — ABNORMAL HIGH (ref 0–10)

## 2019-07-16 LAB — SEDIMENTATION RATE: Sed Rate: 8 mm/hr (ref 0–30)

## 2019-07-16 NOTE — Assessment & Plan Note (Signed)
Burning left foot pain: Burning sensation in the plantar and dorsal surfaces of the left foot near the distal portion of the second, third and fourth metatarsals.  There is associated erythema but no edema in the plantar surface callus/ulcer.  Unfortunately the app would not load today and I was unable to obtain an image of the area.  He has minimal sensation including to pain in the area.  He does endorse sensation of feeling when pressing directly below the ulcerated area but is not certain he can describe this as pain.  Pulses are mild/weak but present.  Point-of-care ABI of the left was 1.11.  He states he has a follow-up with Dr. Sharol Given on December 8.  I am hesitant to prescribe any bikes at this time but given his complicated history we will further evaluate. CBC with mild leukocytosis 11.8, CRP elevated markedly to 48, sed rate within normal limits at 8 with BMP pending.  Plan: POC ABI's 1.11 X-ray of the left foot ordered Consider cephalexin 600 mg 4 times daily pending x-ray results

## 2019-07-16 NOTE — Assessment & Plan Note (Addendum)
>>  ASSESSMENT AND PLAN FOR TYPE 2 DIABETES WITH COMPLICATION WRITTEN ON 07/16/2019  8:52 AM BY Yaeko Fazekas, MD  DMII: Hgb A1c 7.3 % at the last visit.  He follows with endocrinology.  The patient denied polyuria, polydipsia, headache, fatigue, confusion, nausea, vomiting or diaphoresis.  He has a hemoglobin A1c pending for his next appointment with endocrinologist and as such I will obtain 1 today.  I will continue to defer treatment of his mildly well-controlled diabetes to the endocrinologist.  Plan: Continue V-Go pump Continue empagliflozin Continue Metformin Continue pravastatin BMP today  >>ASSESSMENT AND PLAN FOR DIABETIC POLYNEUROPATHY ASSOCIATED WITH TYPE 2 DIABETES MELLITUS WRITTEN ON 07/16/2019  8:53 AM BY Camauri Fleece, MD  See diabetic foot ulcer from the same day

## 2019-07-16 NOTE — Assessment & Plan Note (Signed)
See diabetic foot ulcer from the same day

## 2019-07-16 NOTE — Assessment & Plan Note (Signed)
Abdominal distention: Patient is concerned due to outpouching in the central abdomen when sitting up.  He noticed this first while at the beach changing his shirt.    He denies constipation, lack of passing gas, pain of the area, fever, or redness/swelling of the area.  On exam the abdominal muscles appear to be split with an approximate 6 to 7 cm gap between them from a few centimeters above the umbilicus to below the xiphoid.  There is no tenderness to palpation, and I am unable to palpate contents within this pouching.  This appears to be likely rectus abdominis diastases.  I do not see evidence of a hernia.  Plan:  I advised the patient to perform planking exercises for his abdomen from his knees as well as side planks.  I am not aware of any other beneficial tactics aside from weight loss for this process.

## 2019-07-16 NOTE — Assessment & Plan Note (Signed)
Hypertension: Patient's BP today is 141/95 with a goal of <140/80.  The patient is only on an SGLT2 inhibitor at this time.  His blood pressure has not been this markedly elevated routinely in the past.  We will repeat a measurement at his next visit and if he remains persistently elevated consider interventional therapy.  He denied, chest pain, headache, visual changes, lightheadedness, weakness, dizziness on standing, swelling in the feet or ankles.

## 2019-07-16 NOTE — Assessment & Plan Note (Signed)
Tympanic membrane perforation: There is presence of purulence in the right external auditory canal of a green/gray appearance.  I do not see any marked erythema/edema of the canal.  This is obstructing the lower one third of the tympanic membrane.  The remaining membrane appears intact with good light reflex.  I am unable to completely evaluate for any perforation but feel this is less likely.  The patient is concerned due to recurrent drainage of purulent material and large quantities from his right ear.  Although this could be otitis externa given his history of perforation and concern for such I agree the ENT referral will be most efficacious to avoid exacerbating his condition.  Plan:  Referral to ENT placed

## 2019-07-17 ENCOUNTER — Telehealth: Payer: Self-pay | Admitting: Internal Medicine

## 2019-07-17 DIAGNOSIS — E11621 Type 2 diabetes mellitus with foot ulcer: Secondary | ICD-10-CM

## 2019-07-17 DIAGNOSIS — L97529 Non-pressure chronic ulcer of other part of left foot with unspecified severity: Secondary | ICD-10-CM

## 2019-07-17 NOTE — Telephone Encounter (Signed)
Discussed results of the Xray, ESR and CRP today with the patient. He is to see Dr. Sharol Given on Tuesday the 8th. He stated that his foot felt much better the day before and about the same today. I will hold off on antibiotics at this time. Given his leukocytosis of 11.8 and elevated CRP I would have a low threshold to prescribe a course of doxycycline 162m BID vs Cephalexin 5050mQID for 10 days if he calls with progression of the injury.

## 2019-07-17 NOTE — Telephone Encounter (Signed)
Pt is calling regarding results [phone]

## 2019-07-17 NOTE — Telephone Encounter (Signed)
Pls contact pt [phone]...  He is worry about his foot

## 2019-07-20 NOTE — Progress Notes (Signed)
Internal Medicine Clinic Attending  Case discussed with Dr. Harbrecht at the time of the visit.  We reviewed the resident's history and exam and pertinent patient test results.  I agree with the assessment, diagnosis, and plan of care documented in the resident's note.   

## 2019-07-21 ENCOUNTER — Ambulatory Visit (INDEPENDENT_AMBULATORY_CARE_PROVIDER_SITE_OTHER): Payer: Medicare HMO

## 2019-07-21 ENCOUNTER — Other Ambulatory Visit: Payer: Self-pay

## 2019-07-21 ENCOUNTER — Ambulatory Visit (INDEPENDENT_AMBULATORY_CARE_PROVIDER_SITE_OTHER): Payer: Medicare HMO | Admitting: Orthopedic Surgery

## 2019-07-21 ENCOUNTER — Encounter: Payer: Self-pay | Admitting: Orthopedic Surgery

## 2019-07-21 VITALS — Ht 72.0 in | Wt 210.0 lb

## 2019-07-21 DIAGNOSIS — L97524 Non-pressure chronic ulcer of other part of left foot with necrosis of bone: Secondary | ICD-10-CM | POA: Diagnosis not present

## 2019-07-21 DIAGNOSIS — M86272 Subacute osteomyelitis, left ankle and foot: Secondary | ICD-10-CM

## 2019-07-21 DIAGNOSIS — Z89511 Acquired absence of right leg below knee: Secondary | ICD-10-CM | POA: Diagnosis not present

## 2019-07-21 DIAGNOSIS — I739 Peripheral vascular disease, unspecified: Secondary | ICD-10-CM | POA: Diagnosis not present

## 2019-07-21 NOTE — Progress Notes (Signed)
Office Visit Note   Patient: Gregory May           Date of Birth: 1960/09/20           MRN: [id number] Visit Date: 07/21/2019              Requested by: Lanelle BalHarbrecht, [place], MD [address] [place],  [place] [id number] PCP: Lanelle BalHarbrecht, Lawrence, MD  Chief Complaint  Patient presents with  . Right Leg - Follow-up    Hx of Right BKA& Left Foot Toenail Trim      HPI: Patient is a 58 year old gentleman with a right transtibial amputation he was recently had a new socket fabricated and feels great with his artificial limb denies any ulcers or pain.  Patient states he has had recurrent episodes of cellulitis in the left forefoot with burning pain on the dorsum of the third toe metatarsal head.  He states that the redness and cellulitis has been painful.  Assessment & Plan: Visit Diagnoses:  1. Subacute osteomyelitis, left ankle and foot (HCC)   2. Non-pressure chronic ulcer of other part of left foot with necrosis of bone (HCC)     Plan: Patient definitely has early osteomyelitis of the third metatarsal head the ulcer probes to bone the silver nitrate touches the bone.  Patient has had recurrent cellulitis with an elevated CRP and elevated white cell count with a left shift.  Recommended proceeding with a third ray amputation.  Patient states he would like to call the office later this week and set up surgery for Wednesday or Friday of next week.  Discussed that we would proceed with this as an outpatient surgery and the importance of nonweightbearing after surgery was discussed.  Patient states he uses crutches without problem.  Follow-Up Instructions: Return in about 2 weeks (around 08/04/2019).   Ortho Exam  Patient is alert, oriented, no adenopathy, well-dressed, normal affect, normal respiratory effort. Examination patient has a good dorsalis pedis pulse.  He has cellulitis across the forefoot.  He has an ulcer beneath the third metatarsal head.  After informed consent a 10  blade knife was used to debride the skin and soft tissue back to bleeding viable granulation tissue.  Silver nitrate was used hemostasis and the silver nitrate extended circumferentially around the third metatarsal head.  The bone is palpable.  After debridement the ulcer is 2 cm in diameter 1 cm deep.  Imaging: Xr Foot Complete Left  Result Date: 07/21/2019 Three-view radiographs of the left foot shows extreme peripheral vascular disease with calcification of the arteries to the MTP joints.  Patient has a third metatarsal head without bony destruction however the silver nitrate extends down to the metatarsal head.  No images are attached to the encounter.  Labs: Lab Results  Component Value Date   HGBA1C 7.3 (H) 05/12/2019   HGBA1C 7.7 (H) 01/20/2019   HGBA1C 7.0 (H) 10/20/2018   ESRSEDRATE 8 07/15/2019   ESRSEDRATE 56 (H) 06/01/2015   CRP 48 (H) 07/15/2019   REPTSTATUS 06/07/2015 FINAL 06/01/2015   CULT  06/01/2015    NO GROWTH 5 DAYS [facility]      Lab Results  Component Value Date   ALBUMIN 4.2 05/12/2019   ALBUMIN 3.9 01/20/2019   ALBUMIN 4.2 10/20/2018    No results found for: MG Lab Results  Component Value Date   VD25OH 16 (L) 11/05/2013    No results found for: PREALBUMIN CBC EXTENDED Latest Ref Rng & Units  07/15/2019 06/07/2015 06/07/2015  WBC 3.4 - 10.8 x10E3/uL 11.8(H) 13.2(H) 12.2(H)  RBC 4.14 - 5.80 x10E6/uL 5.23 4.57 4.51  HGB 13.0 - 17.7 g/dL 16.2 13.9 13.2  HCT 37.5 - 51.0 % 47.4 41.7 41.3  PLT 150 - 450 x10E3/uL 383 435(H) 409(H)  NEUTROABS 1.4 - 7.0 x10E3/uL 8.2(H) 8.9(H) -  LYMPHSABS 0.7 - 3.1 x10E3/uL 2.2 2.5 -     Body mass index is 28.48 kg/m.  Orders:  Orders Placed This Encounter  Procedures  . XR Foot Complete Left   No orders of the defined types were placed in this encounter.    Procedures: No procedures performed  Clinical Data: No additional findings.  ROS:  All other systems negative, except as  noted in the HPI. Review of Systems  Objective: Vital Signs: Ht 6' (1.829 m)   Wt 210 lb (95.3 kg)   BMI 28.48 kg/m   Specialty Comments:  No specialty comments available.  PMFS History: Patient Active Problem List   Diagnosis Date Noted  . Diastasis of rectus abdominis 07/16/2019  . 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 Essential hypertension 06/19/2018  . Peripheral neuropathy 12/19/2017  . Non-pressure chronic ulcer of right calf, limited to breakdown of skin ([place]) 04/17/2017  . Acquired absence of right leg below knee ([place]) 04/17/2017  . Carpal tunnel syndrome 03/18/2017  . Colon cancer screening 06/21/2015  . Status post below knee amputation of right lower extremity ([place]) 06/07/2015  . Diabetic polyneuropathy associated with type 2 diabetes mellitus ([place]) 12/09/2014  . Diabetes mellitus with carpal tunnel syndrome ([place]) 12/09/2014  . DJD (degenerative joint disease) of knee 07/21/2014  . Osteoarthritis, hip, bilateral 05/25/2014  . Pulmonary emboli ([place]) 02/08/2014  . DVT (deep venous thrombosis) ([place]) 02/08/2014  . Onychomycosis 10/14/2013  . Pure hypercholesterolemia 09/25/2013  . Diabetic foot ulcer ([place]) 09/15/2013  . Sebaceous cyst 08/16/2011  . Poorly controlled type 2 diabetes mellitus with complication ([place]) 81/82/9937   Past Medical History:  Diagnosis Date  . Arthritis    bilateral hips  . Deep vein thrombosis (DVT) ([place])   . Diabetes mellitus    type II  . 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 DJD (degenerative joint disease)   . Pulmonary emboli ([place]) 02/08/2014   Date of diagnosis February 08 2014, on chest CTA Hospitalized for 3 days Had some symptoms of shortness of breath, and chest pain With intercurrent DVT of the left LE. Duration of anticoagulation: 8 months. End date 10/11/2014.  Anticoagulant: Lovenox 120 units daily Switched to Eliquis on  05/25/2014 per patient preference   . Pulmonary embolism ([place])   . Sebaceous cyst    on back of neck    Family History  Problem Relation Age of Onset  . Cancer Mother        breast, colon, liver  . Diabetes Father     Past Surgical History:  Procedure Laterality Date  . AMPUTATION Right 06/03/2015   Procedure: Right Below Knee Amputation;  Surgeon: [name], MD;  Location: [place];  Service: Orthopedics;  Laterality: Right;  . CLOSED REDUCTION WITH HUMER PIN INSERTION  1974   left hip  . HARDWARE REMOVAL Left 07/21/2014   Procedure: HARDWARE REMOVAL;  Surgeon: Ninetta Lights, MD;  Location: [facility];  Service: Orthopedics;  Laterality: Left;  . JOINT REPLACEMENT  2006 (approx)   right hip replaced  .  ROTATOR CUFF REPAIR  2005 (approx)   right   . 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 TOTAL HIP ARTHROPLASTY Left 07/21/2014   Procedure: TOTAL HIP ARTHROPLASTY ANTERIOR APPROACH;  Surgeon: Loreta Ave, MD;  Location: [facility] OR;  Service: Orthopedics;  Laterality: Left;   Social History   Occupational History  . Not on file  Tobacco Use  . Smoking status: Never Smoker  . Smokeless tobacco: Never Used  Substance and Sexual Activity  . Alcohol use: Yes    Alcohol/week: 0.0 standard drinks    Comment: occasional  . Drug use: No  . Sexual activity: Never

## 2019-07-22 ENCOUNTER — Telehealth: Payer: Self-pay | Admitting: Orthopedic Surgery

## 2019-07-22 NOTE — Telephone Encounter (Signed)
I called advised the pt that he does have early osteo and that Dr. Sharol Given is wanting to sch 3rd ray amputation next week. If s/s worsen from yesterday's visit to call and let us know and could move surgery up. Pt did note a spike in his blood sugar and advised that this could be due to infection. Pt advised any temp, n/v increased pain, swelling or redness to call and let us know.

## 2019-07-22 NOTE — Telephone Encounter (Signed)
Patient called asked if there is anything he should be looking out for such as redness and swelling to his foot. Patient said he is experiencing burning pain off and on that eventually goes away. Patient said the burning and pain kept him up last night. The number to contact patient is [phone]

## 2019-07-23 ENCOUNTER — Encounter (HOSPITAL_COMMUNITY): Payer: Self-pay | Admitting: Orthopedic Surgery

## 2019-07-23 ENCOUNTER — Other Ambulatory Visit: Payer: Self-pay

## 2019-07-23 ENCOUNTER — Other Ambulatory Visit: Payer: Self-pay | Admitting: Physician Assistant

## 2019-07-23 ENCOUNTER — Telehealth: Payer: Self-pay | Admitting: Orthopedic Surgery

## 2019-07-23 NOTE — Progress Notes (Signed)
Mr Kipp denies chest pain or shortness of breath. Patient denies s/s of Covid. Patient will go to [facility] for Covid test @  0800 and then come to the hospital.  Mr Metts has type II diabetes, patient reports that CBGs run in the 120 in the am. I instructed Mr Lemming to decrease his V Go Pump by 20% at midnight. I instructed patient to check CBG after awaking and every 2 hours until arrival  to the hospital.  I Instructed patient if CBG is less than 70 to drink  1/2 cup of a clear juice.  Stop Insulin  Pump .Recheck CBG in 15 minutes then call pre- op desk at [phone] for further instructions.   I instructed patient to not eat after midnight, but may have clear liquids until 0800. I reviewed what clear liquids are, patient repeated the information I gave him.

## 2019-07-23 NOTE — Telephone Encounter (Signed)
Pt is to have a 3rd ray amputation next week. Would like a call back he is ready to schedule.

## 2019-07-23 NOTE — Telephone Encounter (Signed)
Patient called in requesting call back from [facility]. Patient states would like call back to schedule surgery. Patient phone number is [phone].

## 2019-07-24 ENCOUNTER — Other Ambulatory Visit: Payer: Self-pay

## 2019-07-24 ENCOUNTER — Other Ambulatory Visit (HOSPITAL_COMMUNITY): Payer: Self-pay

## 2019-07-24 ENCOUNTER — Other Ambulatory Visit (HOSPITAL_COMMUNITY)
Admission: RE | Admit: 2019-07-24 | Discharge: 2019-07-24 | Disposition: A | Discharge status: Home / Self Care | Payer: Medicare HMO | Source: Ambulatory Visit | Attending: Orthopedic Surgery | Admitting: Orthopedic Surgery

## 2019-07-24 ENCOUNTER — Encounter (HOSPITAL_COMMUNITY)
Admission: RE | Disposition: A | Discharge status: Home / Self Care | Payer: Self-pay | Source: Home / Self Care | Attending: Orthopedic Surgery

## 2019-07-24 ENCOUNTER — Encounter (HOSPITAL_COMMUNITY): Payer: Self-pay | Admitting: Orthopedic Surgery

## 2019-07-24 ENCOUNTER — Ambulatory Visit (HOSPITAL_COMMUNITY): Payer: Medicare HMO | Admitting: Anesthesiology

## 2019-07-24 ENCOUNTER — Ambulatory Visit (HOSPITAL_COMMUNITY)
Admission: RE | Admit: 2019-07-24 | Discharge: 2019-07-24 | Disposition: A | Discharge status: Home / Self Care | Payer: Medicare HMO | Attending: Orthopedic Surgery | Admitting: Orthopedic Surgery

## 2019-07-24 DIAGNOSIS — Z96643 Presence of artificial hip joint, bilateral: Secondary | ICD-10-CM | POA: Insufficient documentation

## 2019-07-24 DIAGNOSIS — L02612 Cutaneous abscess of left foot: Secondary | ICD-10-CM

## 2019-07-24 DIAGNOSIS — M86272 Subacute osteomyelitis, left ankle and foot: Secondary | ICD-10-CM | POA: Insufficient documentation

## 2019-07-24 DIAGNOSIS — Z794 Long term (current) use of insulin: Secondary | ICD-10-CM | POA: Insufficient documentation

## 2019-07-24 DIAGNOSIS — E1169 Type 2 diabetes mellitus with other specified complication: Secondary | ICD-10-CM | POA: Diagnosis not present

## 2019-07-24 DIAGNOSIS — Z20828 Contact with and (suspected) exposure to other viral communicable diseases: Secondary | ICD-10-CM | POA: Insufficient documentation

## 2019-07-24 DIAGNOSIS — L97524 Non-pressure chronic ulcer of other part of left foot with necrosis of bone: Secondary | ICD-10-CM | POA: Diagnosis not present

## 2019-07-24 DIAGNOSIS — E1165 Type 2 diabetes mellitus with hyperglycemia: Secondary | ICD-10-CM | POA: Insufficient documentation

## 2019-07-24 DIAGNOSIS — Z89511 Acquired absence of right leg below knee: Secondary | ICD-10-CM | POA: Diagnosis not present

## 2019-07-24 DIAGNOSIS — I10 Essential (primary) hypertension: Secondary | ICD-10-CM | POA: Diagnosis not present

## 2019-07-24 DIAGNOSIS — M869 Osteomyelitis, unspecified: Secondary | ICD-10-CM | POA: Diagnosis not present

## 2019-07-24 HISTORY — PX: AMPUTATION: SHX166

## 2019-07-24 LAB — RESPIRATORY PANEL BY RT PCR (FLU A&B, COVID)
Influenza A by PCR: NEGATIVE
Influenza B by PCR: NEGATIVE
SARS Coronavirus 2 by RT PCR: NEGATIVE

## 2019-07-24 LAB — GLUCOSE, CAPILLARY: Glucose-Capillary: 131 mg/dL — ABNORMAL HIGH (ref 70–99)

## 2019-07-24 SURGERY — AMPUTATION, FOOT, RAY
Anesthesia: General | Laterality: Left

## 2019-07-24 MED ORDER — 0.9 % SODIUM CHLORIDE (POUR BTL) OPTIME
TOPICAL | Status: DC | PRN
  Administered 2019-07-24: 1000 mL

## 2019-07-24 MED ORDER — MIDAZOLAM HCL 5 MG/5ML IJ SOLN
INTRAMUSCULAR | Status: DC | PRN
  Administered 2019-07-24: 2 mg via INTRAVENOUS

## 2019-07-24 MED ORDER — CHLORHEXIDINE GLUCONATE 4 % EX LIQD: 60.0000 mL | Freq: Once | CUTANEOUS | Status: DC

## 2019-07-24 MED ORDER — PROPOFOL 10 MG/ML IV BOLUS
INTRAVENOUS | Status: DC | PRN
  Administered 2019-07-24: 150 mg via INTRAVENOUS

## 2019-07-24 MED ORDER — FENTANYL CITRATE (PF) 250 MCG/5ML IJ SOLN
INTRAMUSCULAR | Status: AC
  Filled 2019-07-24: qty 5

## 2019-07-24 MED ORDER — ACETAMINOPHEN 325 MG PO TABS: 325.0000 mg | ORAL_TABLET | ORAL | Status: DC | PRN

## 2019-07-24 MED ORDER — ONDANSETRON HCL 4 MG/2ML IJ SOLN: 4.0000 mg | Freq: Once | INTRAMUSCULAR | Status: DC | PRN

## 2019-07-24 MED ORDER — FENTANYL CITRATE (PF) 100 MCG/2ML IJ SOLN
INTRAMUSCULAR | Status: AC
  Filled 2019-07-24: qty 2

## 2019-07-24 MED ORDER — MEPERIDINE HCL 25 MG/ML IJ SOLN: 6.2500 mg | INTRAMUSCULAR | Status: DC | PRN

## 2019-07-24 MED ORDER — OXYCODONE-ACETAMINOPHEN 5-325 MG PO TABS: 1.0000 | ORAL_TABLET | ORAL | 0 refills | Status: DC | PRN

## 2019-07-24 MED ORDER — OXYCODONE HCL 5 MG PO TABS: 5.0000 mg | ORAL_TABLET | Freq: Once | ORAL | Status: DC | PRN

## 2019-07-24 MED ORDER — OXYCODONE HCL 5 MG/5ML PO SOLN: 5.0000 mg | Freq: Once | ORAL | Status: DC | PRN

## 2019-07-24 MED ORDER — CEFAZOLIN SODIUM-DEXTROSE 2-4 GM/100ML-% IV SOLN
2.0000 g | INTRAVENOUS | Status: AC
  Administered 2019-07-24: 2 g via INTRAVENOUS

## 2019-07-24 MED ORDER — FENTANYL CITRATE (PF) 100 MCG/2ML IJ SOLN
INTRAMUSCULAR | Status: DC | PRN
  Administered 2019-07-24 (×3): 25 ug via INTRAVENOUS
  Administered 2019-07-24: 50 ug via INTRAVENOUS

## 2019-07-24 MED ORDER — FENTANYL CITRATE (PF) 100 MCG/2ML IJ SOLN
25.0000 ug | INTRAMUSCULAR | Status: DC | PRN
  Administered 2019-07-24 (×2): 50 ug via INTRAVENOUS

## 2019-07-24 MED ORDER — MIDAZOLAM HCL 2 MG/2ML IJ SOLN
INTRAMUSCULAR | Status: AC
  Filled 2019-07-24: qty 2

## 2019-07-24 MED ORDER — LACTATED RINGERS IV SOLN
INTRAVENOUS | Status: DC
  Administered 2019-07-24: 10:00:00 via INTRAVENOUS

## 2019-07-24 MED ORDER — LIDOCAINE 2% (20 MG/ML) 5 ML SYRINGE
INTRAMUSCULAR | Status: DC | PRN
  Administered 2019-07-24: 80 mg via INTRAVENOUS

## 2019-07-24 MED ORDER — ONDANSETRON HCL 4 MG/2ML IJ SOLN
INTRAMUSCULAR | Status: DC | PRN
  Administered 2019-07-24: 4 mg via INTRAVENOUS

## 2019-07-24 MED ORDER — ACETAMINOPHEN 160 MG/5ML PO SOLN: 325.0000 mg | ORAL | Status: DC | PRN

## 2019-07-24 SURGICAL SUPPLY — 31 items
BLADE SAW SGTL MED 73X18.5 STR (BLADE) IMPLANT
BLADE SURG 21 STRL SS (BLADE) ×3 IMPLANT
BNDG COHESIVE 4X5 TAN STRL (GAUZE/BANDAGES/DRESSINGS) ×3 IMPLANT
BNDG GAUZE ELAST 4 BULKY (GAUZE/BANDAGES/DRESSINGS) ×3 IMPLANT
CONT SPEC 4OZ CLIKSEAL STRL BL (MISCELLANEOUS) ×3 IMPLANT
COVER SURGICAL LIGHT HANDLE (MISCELLANEOUS) ×6 IMPLANT
COVER WAND RF STERILE (DRAPES) ×3 IMPLANT
DRAPE U-SHAPE 47X51 STRL (DRAPES) ×6 IMPLANT
DRSG ADAPTIC 3X8 NADH LF (GAUZE/BANDAGES/DRESSINGS) ×3 IMPLANT
DRSG PAD ABDOMINAL 8X10 ST (GAUZE/BANDAGES/DRESSINGS) ×6 IMPLANT
DURAPREP 26ML APPLICATOR (WOUND CARE) ×3 IMPLANT
ELECT REM PT RETURN 9FT ADLT (ELECTROSURGICAL) ×3
ELECTRODE REM PT RTRN 9FT ADLT (ELECTROSURGICAL) ×1 IMPLANT
GAUZE SPONGE 4X4 12PLY STRL (GAUZE/BANDAGES/DRESSINGS) ×3 IMPLANT
GLOVE BIOGEL PI IND STRL 9 (GLOVE) ×1 IMPLANT
GLOVE BIOGEL PI INDICATOR 9 (GLOVE) ×2
GLOVE SURG ORTHO 9.0 STRL STRW (GLOVE) ×3 IMPLANT
GOWN STRL REUS W/ TWL XL LVL3 (GOWN DISPOSABLE) ×2 IMPLANT
GOWN STRL REUS W/TWL XL LVL3 (GOWN DISPOSABLE) ×4
KIT BASIN OR (CUSTOM PROCEDURE TRAY) ×3 IMPLANT
KIT TURNOVER KIT B (KITS) ×3 IMPLANT
NS IRRIG 1000ML POUR BTL (IV SOLUTION) ×3 IMPLANT
PACK ORTHO EXTREMITY (CUSTOM PROCEDURE TRAY) ×3 IMPLANT
PAD ARMBOARD 7.5X6 YLW CONV (MISCELLANEOUS) ×6 IMPLANT
PADDING CAST COTTON 6X4 STRL (CAST SUPPLIES) ×3 IMPLANT
STOCKINETTE IMPERVIOUS LG (DRAPES) IMPLANT
SUT ETHILON 2 0 PSLX (SUTURE) ×3 IMPLANT
TOWEL GREEN STERILE (TOWEL DISPOSABLE) ×3 IMPLANT
TUBE CONNECTING 12'X1/4 (SUCTIONS) ×1
TUBE CONNECTING 12X1/4 (SUCTIONS) ×2 IMPLANT
YANKAUER SUCT BULB TIP NO VENT (SUCTIONS) ×3 IMPLANT

## 2019-07-24 NOTE — Anesthesia Procedure Notes (Signed)
Procedure Name: LMA Insertion Date/Time: 07/24/2019 11:04 AM Performed by: Valda Favia, CRNA Pre-anesthesia Checklist: Patient identified, Emergency Drugs available, Suction available, Patient being monitored and Timeout performed Patient Re-evaluated:Patient Re-evaluated prior to induction Oxygen Delivery Method: Circle system utilized Preoxygenation: Pre-oxygenation with 100% oxygen Induction Type: IV induction LMA: LMA inserted LMA Size: 4.0 Placement Confirmation: positive ETCO2 and breath sounds checked- equal and bilateral Dental Injury: Teeth and Oropharynx as per pre-operative assessment

## 2019-07-24 NOTE — H&P (Signed)
Gregory May is an 58 y.o. male.   Chief Complaint: Left Third Toe Osteomyelitis HPI:  Patient is a 58 year old gentleman with a right transtibial amputation he was recently had a new socket fabricated and feels great with his artificial limb denies any ulcers or pain.  Patient states he has had recurrent episodes of cellulitis in the left forefoot with burning pain on the dorsum of the third toe metatarsal head.  He states that the redness and cellulitis has been painful. Past Medical History:  Diagnosis Date  . Arthritis    bilateral hips  . Deep vein thrombosis (DVT) (HCC)   . Diabetes mellitus    type II  . 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 DJD (degenerative joint disease)   . Pulmonary emboli (HCC) 02/08/2014   Date of diagnosis February 08 2014, on chest CTA Hospitalized for 3 days Had some symptoms of shortness of breath, and chest pain With intercurrent DVT of the left LE. Duration of anticoagulation: 8 months. End date 10/11/2014.  Anticoagulant: Lovenox 120 units daily Switched to Eliquis on 05/25/2014 per patient preference   . Pulmonary embolism (HCC)   . Sebaceous cyst    on back of neck    Past Surgical History:  Procedure Laterality Date  . AMPUTATION Right 06/03/2015   Procedure: Right Below Knee Amputation;  Surgeon: Nadara Mustard, MD;  Location: [facility] OR;  Service: Orthopedics;  Laterality: Right;  . CLOSED REDUCTION WITH HUMER PIN INSERTION  1974   left hip  . HARDWARE REMOVAL Left 07/21/2014   Procedure: HARDWARE REMOVAL;  Surgeon: Loreta Ave, MD;  Location: [facility] OR;  Service: Orthopedics;  Laterality: Left;  . JOINT REPLACEMENT  2006 (approx)   right hip replaced  . ROTATOR CUFF REPAIR  2005 (approx)   right   . 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 TOTAL HIP ARTHROPLASTY Left 07/21/2014   Procedure: TOTAL HIP ARTHROPLASTY ANTERIOR APPROACH;  Surgeon: Loreta Ave, MD;  Location: [facility] OR;  Service: Orthopedics;  Laterality: Left;     Family History  Problem Relation Age of Onset  . Cancer Mother        breast, colon, liver  . Diabetes Father    Social History:  reports that he has never smoked. He has never used smokeless tobacco. He reports current alcohol use. He reports that he does not use drugs.  Allergies: No Known Allergies  No medications prior to admission.    No results found for this or any previous visit (from the past 48 hour(s)). No results found.  Review of Systems  All other systems reviewed and are negative.   There were no vitals taken for this visit. Physical Exam  Patient is alert, oriented, no adenopathy, well-dressed, normal affect, normal respiratory effort. Examination patient has a good dorsalis pedis pulse.  He has cellulitis across the forefoot.  He has an ulcer beneath the third metatarsal head.  After informed consent a 10 blade knife was used to debride the skin and soft tissue back to bleeding viable granulation tissue.  Silver nitrate was used hemostasis and the silver nitrate extended circumferentially around the third metatarsal head.  The bone is palpable.  After debridement the ulcer is 2 cm in diameter 1 cm deep. Assessment/Plan . Subacute osteomyelitis, left ankle and foot (HCC)   2. 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  Patient has had recurrent cellulitis with an elevated CRP and elevated white cell count with a left shift.  Recommended proceeding with a third ray amputation.  Patient states he would like to call the office later this week and set up surgery for Wednesday or Friday of next week.  Discussed that we would proceed with this as an outpatient surgery and the importance of nonweightbearing after surgery was discussed.  Patient states he uses crutches without  problem.   Bevely Palmer Mitch Arquette, PA 07/24/2019, 7:18 AM

## 2019-07-24 NOTE — Transfer of Care (Signed)
Immediate Anesthesia Transfer of Care Note  Patient: Gregory May  Procedure(s) Performed: LEFT FOOT 3RD RAY AMPUTATION (Left )  Patient Location: PACU  Anesthesia Type:General  Level of Consciousness: awake, alert  and oriented  Airway & Oxygen Therapy: Patient Spontanous Breathing and Patient connected to face mask oxygen  Post-op Assessment: Report given to RN and Post -op Vital signs reviewed and stable  Post vital signs: Reviewed and stable  Last Vitals:  Vitals Value Taken Time  BP 136/89 07/24/19 1137  Temp    Pulse 93 07/24/19 1141  Resp 23 07/24/19 1141  SpO2 91 % 07/24/19 1141  Vitals shown include unvalidated device data.  Last Pain:  Vitals:   07/24/19 1140  TempSrc:   PainSc: (P) 10-Worst pain ever         Complications: No apparent anesthesia complications

## 2019-07-24 NOTE — Anesthesia Preprocedure Evaluation (Addendum)
Anesthesia Evaluation  Patient identified by MRN, date of birth, ID band Patient awake    Reviewed: Allergy & Precautions, NPO status , Patient's Chart, lab work & pertinent test results  History of Anesthesia Complications Negative for: history of anesthetic complications  Airway Mallampati: II  TM Distance: >3 FB Neck ROM: Full    Dental  (+) Teeth Intact, Poor Dentition, Chipped, Missing,    Pulmonary neg pulmonary ROS,    breath sounds clear to auscultation       Cardiovascular hypertension, negative cardio ROS   Rhythm:Regular     Neuro/Psych  Neuromuscular disease negative neurological ROS  negative psych ROS   GI/Hepatic negative GI ROS, Neg liver ROS,   Endo/Other  negative endocrine ROSdiabetes, Poorly Controlled, Type 2, Insulin Dependent  Renal/GU negative Renal ROS  negative genitourinary   Musculoskeletal negative musculoskeletal ROS (+) Arthritis ,   Abdominal   Peds negative pediatric ROS (+)  Hematology negative hematology ROS (+)   Anesthesia Other Findings   Reproductive/Obstetrics negative OB ROS                            Anesthesia Physical  Anesthesia Plan  ASA: III  Anesthesia Plan: General   Post-op Pain Management:    Induction: Intravenous  PONV Risk Score and Plan: Ondansetron and Treatment may vary due to age or medical condition  Airway Management Planned: LMA and Oral ETT  Additional Equipment: None  Intra-op Plan:   Post-operative Plan: Extubation in OR  Informed Consent: I have reviewed the patients History and Physical, chart, labs and discussed the procedure including the risks, benefits and alternatives for the proposed anesthesia with the patient or authorized representative who has indicated his/her understanding and acceptance.     Dental advisory given  Plan Discussed with: CRNA, Surgeon and Anesthesiologist  Anesthesia Plan  Comments: (  )        Anesthesia Quick Evaluation

## 2019-07-24 NOTE — Op Note (Signed)
07/24/2019  12:02 PM  PATIENT:  Gregory May    PRE-OPERATIVE DIAGNOSIS:  Osteomyelitis Left 3rd Metatarsal Head, with plantar ulcer  POST-OPERATIVE DIAGNOSIS:  Same  PROCEDURE:  LEFT FOOT 3RD RAY AMPUTATION Local tissue rearrangement for wound closure 3 x 9 cm.  SURGEON:  [name], MD  PHYSICIAN ASSISTANT:None ANESTHESIA:   General  PREOPERATIVE INDICATIONS:  Gregory May is a  58 y.o. male with a diagnosis of Osteomyelitis Left 3rd Metatarsal Head who failed conservative measures and elected for surgical management.    The risks benefits and alternatives were discussed with the patient preoperatively including but not limited to the risks of infection, bleeding, nerve injury, cardiopulmonary complications, the need for revision surgery, among others, and the patient was willing to proceed.  OPERATIVE IMPLANTS: None  @ENCIMAGES @  OPERATIVE FINDINGS: Abscess involving the third metatarsal head tissue sent for cultures.  OPERATIVE PROCEDURE: Patient brought the operating room underwent a general anesthetic.  After adequate levels anesthesia were obtained patient's left lower extremity was prepped using DuraPrep draped into a sterile field a timeout was called.  AV incision was made plantar and dorsal around the third ray.  There was a large abscess around the MTP joint.  The abscess and bone tissue was sent for cultures.  Further resection of the soft tissue was performed to debride back to healthy viable margins.  The wound was irrigated with normal saline electrocautery was used hemostasis.  The remaining wound was 3 x 9 cm.  Local tissue rearrangement was used to close the wound with 2-0 nylon 3 x 9 cm.  A sterile dressing was applied patient was extubated taken the PACU in stable condition.   DISCHARGE PLANNING:  Antibiotic duration: Preoperative antibiotics further antibiotics pending cultures  Weightbearing: Touchdown weightbearing on the left  Pain  medication: Prescription for Percocet  Dressing care/ Wound VAC: Dry dressing change of the office follow-up  Ambulatory devices: Crutches  Discharge to: Home.  Follow-up: In the office 1 week post operative.

## 2019-07-26 NOTE — Anesthesia Postprocedure Evaluation (Signed)
Anesthesia Post Note  Patient: Gregory May  Procedure(s) Performed: LEFT FOOT 3RD RAY AMPUTATION (Left )     Anesthesia Post Evaluation  Last Vitals:  Vitals:   07/24/19 1218 07/24/19 1250  BP: 130/76 122/82  Pulse: 75 75  Resp: 15 14  Temp:    SpO2: 95% 95%    Last Pain:  Vitals:   07/24/19 1218  TempSrc:   PainSc: 3                  Kaislee Chao

## 2019-07-27 ENCOUNTER — Other Ambulatory Visit: Payer: Self-pay | Admitting: Pharmacy Technician

## 2019-07-27 ENCOUNTER — Telehealth: Payer: Self-pay | Admitting: Orthopedic Surgery

## 2019-07-27 ENCOUNTER — Other Ambulatory Visit: Payer: Self-pay | Admitting: Orthopedic Surgery

## 2019-07-27 MED ORDER — DOXYCYCLINE HYCLATE 100 MG PO TABS: 100.0000 mg | ORAL_TABLET | Freq: Two times a day (BID) | ORAL | 0 refills | Status: DC

## 2019-07-27 NOTE — Patient Outreach (Signed)
[facility]) Care Management  07/27/2019  KELWIN GIBLER 1961/02/11 [id number]    Successful call placed to patient regarding patient assistance application(s) for Jardiance with [employer] and Novolog with [employer] , HIPAA identifiers verified.   Patient informed he has received the applications for [id number] re enrollment in the mail but has not sent them back in as he just recently had surgery. He informed he is doing well but is having to stay with someone else while he recovers. He informed he hopes to have the applications back by year's end.  Follow up: Will route note to [place] for case closure  if document(s) have not been received after the holiday season/in the next 20 business days.  Clemons Salvucci P. Kamee Bobst, [place] Management [phone]

## 2019-07-27 NOTE — Telephone Encounter (Signed)
Patient called in requesting Dr. Sharol Given nurse give call back. Patient concerned of some oozing from surgery from amputated toe. Patient phone number is [phone].

## 2019-07-27 NOTE — Telephone Encounter (Signed)
Pt called in requesting a call back from star said he received a voicemail from her but he didn't understand what it was saying. Please give him a call  [phone]

## 2019-07-27 NOTE — Telephone Encounter (Signed)
Patient was called and advised to come in for an appt. Patient denies oozing today and said he thinks he would be okay. I informed patient that he could call us back and get an appt scheduled if he needs to come in. Patient states he has not taken off his dressing and he will keep an eye on it. Patient was informed of any concerns to make note of it and give [place] a call. He is scheduled for 08/03/2019.

## 2019-07-28 ENCOUNTER — Encounter: Payer: Self-pay | Admitting: Physician Assistant

## 2019-07-28 ENCOUNTER — Ambulatory Visit (INDEPENDENT_AMBULATORY_CARE_PROVIDER_SITE_OTHER): Payer: Medicare HMO | Admitting: Physician Assistant

## 2019-07-28 ENCOUNTER — Other Ambulatory Visit: Payer: Self-pay

## 2019-07-28 VITALS — Ht 72.0 in | Wt 210.1 lb

## 2019-07-28 DIAGNOSIS — M86272 Subacute osteomyelitis, left ankle and foot: Secondary | ICD-10-CM

## 2019-07-28 NOTE — Telephone Encounter (Signed)
Patient was called and informed Rx was sent in and he advised of appt today due to currently bleeding.

## 2019-07-28 NOTE — Progress Notes (Signed)
Office Visit Note   Patient: Gregory May           Date of Birth: 02/08/1961           MRN: [id number] Visit Date: 07/28/2019              Requested by: Kathi Ludwig, MD [address] [place],  [place] [address] PCP: Kathi Ludwig, MD  Chief Complaint  Patient presents with  . Left Foot - Routine Post Op    07/24/2019 Left Foot 3rd ray      HPI: Patient presents today 4 days status post left foot third ray amputation overall he is doing well he did begin his doxycycline yesterday.    Assessment & Plan: Visit Diagnoses: No diagnosis found.  Plan: He will follow up in 3 days.  He may wash this gently with soap and he will continue with the antibiotics  Follow-Up Instructions: No follow-ups on file.   Ortho Exam  Patient is alert, oriented, no adenopathy, well-dressed, normal affect, normal respiratory effort. Exam of his left foot demonstrates wound edges to be healthy with no evidence of dehiscence he does have some mild erythema surrounding the incision very small amount of drainage  Imaging: No results found. No images are attached to the encounter.  Labs: Lab Results  Component Value Date   HGBA1C 7.3 (H) 05/12/2019   HGBA1C 7.7 (H) 01/20/2019   HGBA1C 7.0 (H) 10/20/2018   ESRSEDRATE 8 07/15/2019   ESRSEDRATE 56 (H) 06/01/2015   CRP 48 (H) 07/15/2019   REPTSTATUS PENDING 07/24/2019   GRAMSTAIN  07/24/2019    RARE WBC PRESENT, PREDOMINANTLY MONONUCLEAR NO ORGANISMS SEEN Performed at [facility], [place] [address] [place],  [address]    CULT  07/24/2019    RARE STAPHYLOCOCCUS CAPRAE NO ANAEROBES ISOLATED; CULTURE IN PROGRESS FOR 5 DAYS    LABORGA STAPHYLOCOCCUS CAPRAE 07/24/2019     Lab Results  Component Value Date   ALBUMIN 4.2 05/12/2019   ALBUMIN 3.9 01/20/2019   ALBUMIN 4.2 10/20/2018    No results found for: MG Lab Results  Component Value Date   VD25OH 16 (L) 11/05/2013    No results found for: PREALBUMIN  CBC EXTENDED Latest Ref Rng & Units 07/15/2019 06/07/2015 06/07/2015  WBC 3.4 - 10.8 x10E3/uL 11.8(H) 13.2(H) 12.2(H)  RBC 4.14 - 5.80 x10E6/uL 5.23 4.57 4.51  HGB 13.0 - 17.7 g/dL 16.2 13.9 13.2  HCT 37.5 - 51.0 % 47.4 41.7 41.3  PLT 150 - 450 x10E3/uL 383 435(H) 409(H)  NEUTROABS 1.4 - 7.0 x10E3/uL 8.2(H) 8.9(H) -  LYMPHSABS 0.7 - 3.1 x10E3/uL 2.2 2.5 -     Body mass index is 28.49 kg/m.  Orders:  No orders of the defined types were placed in this encounter.  No orders of the defined types were placed in this encounter.    Procedures: No procedures performed  Clinical Data: No additional findings.  ROS:  All other systems negative, except as noted in the HPI. Review of Systems  Objective: Vital Signs: Ht 6' (1.829 m)   Wt 210 lb 1.6 oz (95.3 kg)   BMI 28.49 kg/m   Specialty Comments:  No specialty comments available.  PMFS History: Patient Active Problem List   Diagnosis Date Noted  . Cutaneous abscess of left foot   . Diastasis of rectus abdominis 07/16/2019  . 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 Essential  hypertension 06/19/2018  . Peripheral neuropathy 12/19/2017  . Non-pressure chronic ulcer of right calf, limited to breakdown of skin (HCC) 04/17/2017  . Acquired absence of right leg below knee (HCC) 04/17/2017  . Carpal tunnel syndrome 03/18/2017  . Colon cancer screening 06/21/2015  . Status post below knee amputation of right lower extremity (HCC) 06/07/2015  . 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 Diabetic polyneuropathy associated with type 2 diabetes mellitus (HCC) 12/09/2014  . Diabetes mellitus with carpal tunnel syndrome (HCC) 12/09/2014  . DJD (degenerative joint disease) of knee 07/21/2014  . Osteoarthritis, hip, bilateral 05/25/2014  . Pulmonary emboli (HCC) 02/08/2014  . DVT (deep venous thrombosis) (HCC) 02/08/2014   . Onychomycosis 10/14/2013  . Pure hypercholesterolemia 09/25/2013  . Diabetic foot ulcer (HCC) 09/15/2013  . Sebaceous cyst 08/16/2011  . Poorly controlled type 2 diabetes mellitus with complication (HCC) 09/15/1997   Past Medical History:  Diagnosis Date  . Arthritis    bilateral hips  . Deep vein thrombosis (DVT) (HCC)   . Diabetes mellitus    type II  . 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 DJD (degenerative joint disease)   . Pulmonary emboli (HCC) 02/08/2014   Date of diagnosis February 08 2014, on chest CTA Hospitalized for 3 days Had some symptoms of shortness of breath, and chest pain With intercurrent DVT of the left LE. Duration of anticoagulation: 8 months. End date 10/11/2014.  Anticoagulant: Lovenox 120 units daily Switched to Eliquis on 05/25/2014 per patient preference   . Pulmonary embolism (HCC)   . Sebaceous cyst    on back of neck    Family History  Problem Relation Age of Onset  . Cancer Mother        breast, colon, liver  . Diabetes Father     Past Surgical History:  Procedure Laterality Date  . AMPUTATION Right 06/03/2015   Procedure: Right Below Knee Amputation;  Surgeon: Nadara Mustard, MD;  Location: [facility] OR;  Service: Orthopedics;  Laterality: Right;  . AMPUTATION Left 07/24/2019   Procedure: LEFT FOOT 3RD RAY AMPUTATION;  Surgeon: Nadara Mustard, MD;  Location: [facility] OR;  Service: Orthopedics;  Laterality: Left;  . CLOSED REDUCTION WITH HUMER PIN INSERTION  1974   left hip  . HARDWARE REMOVAL Left 07/21/2014   Procedure: HARDWARE REMOVAL;  Surgeon: Loreta Ave, MD;  Location: [facility] OR;  Service: Orthopedics;  Laterality: Left;  . JOINT REPLACEMENT  2006 (approx)   right hip replaced  . ROTATOR CUFF REPAIR  2005 (approx)   right   . 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 TOTAL HIP ARTHROPLASTY Left 07/21/2014   Procedure: TOTAL HIP ARTHROPLASTY ANTERIOR APPROACH;  Surgeon: Loreta Ave, MD;  Location: [facility] OR;  Service: Orthopedics;   Laterality: Left;   Social History   Occupational History  . Not on file  Tobacco Use  . Smoking status: Never Smoker  . Smokeless tobacco: Never Used  Substance and Sexual Activity  . Alcohol use: Yes    Alcohol/week: 0.0 standard drinks    Comment: beer and mixed drink maybe 1 - 2 times a month  . Drug use: No  . Sexual activity: Never

## 2019-07-29 LAB — AEROBIC/ANAEROBIC CULTURE W GRAM STAIN (SURGICAL/DEEP WOUND)

## 2019-07-30 ENCOUNTER — Other Ambulatory Visit: Payer: Self-pay | Admitting: Physician Assistant

## 2019-07-30 ENCOUNTER — Telehealth: Payer: Self-pay

## 2019-07-30 ENCOUNTER — Telehealth: Payer: Self-pay | Admitting: Orthopedic Surgery

## 2019-07-30 MED ORDER — OXYCODONE-ACETAMINOPHEN 5-325 MG PO TABS: 1.0000 | ORAL_TABLET | ORAL | 0 refills | Status: DC | PRN

## 2019-07-30 NOTE — Telephone Encounter (Signed)
Please advise, patient would like another refill for Oxy. Thanks

## 2019-07-30 NOTE — Telephone Encounter (Signed)
I spoke with the patient. He has had a little bleeding. Family is going to help him change the dressing tonight. If anything concerning I told him I could see him tomorrow afternoon. He is not really worried. I called in a refill

## 2019-07-30 NOTE — Telephone Encounter (Addendum)
Patient called wanting to know is it normal for him to have a little bit of blood coming through the dressing?  Patient was advised on Tuesday, 07/28/2019 that he could shower, but not soak his left foot.  Patient would like a Rx refill on Oxycodone. Cb# is [phone].  Please advise.  Thank you.

## 2019-07-30 NOTE — Telephone Encounter (Signed)
Error

## 2019-07-30 NOTE — Telephone Encounter (Signed)
Patient called. He stated that he was experiencing bleeding from his problem area. I forwarded his call to triage.

## 2019-08-03 ENCOUNTER — Ambulatory Visit (INDEPENDENT_AMBULATORY_CARE_PROVIDER_SITE_OTHER): Payer: Medicare HMO | Admitting: Orthopedic Surgery

## 2019-08-03 ENCOUNTER — Other Ambulatory Visit: Payer: Self-pay

## 2019-08-03 ENCOUNTER — Encounter: Payer: Self-pay | Admitting: Orthopedic Surgery

## 2019-08-03 VITALS — Ht 72.0 in | Wt 210.1 lb

## 2019-08-03 DIAGNOSIS — M86272 Subacute osteomyelitis, left ankle and foot: Secondary | ICD-10-CM

## 2019-08-03 NOTE — Progress Notes (Signed)
Office Visit Note   Patient: Gregory May           Date of Birth: 12-Jul-1961           MRN: [id number] Visit Date: 08/03/2019              Requested by: Lanelle Bal, MD [address] [place],  [place] [address] PCP: Lanelle Bal, MD  Chief Complaint  Patient presents with  . Left Foot - Routine Post Op    07/24/2019 Left Foot 3rd ray      HPI: Patient presents today 11 days status post left foot third ray amputation he is overall doing well he has been good about not putting weight on his forefoot he has been taking antibiotics he has no complaints  Assessment & Plan: Visit Diagnoses: No diagnosis found.  Plan: He will follow up in 10 days time should continue to refrain from weightbearing but he may wash this with some Dial soap and water  Follow-Up Instructions: No follow-ups on file.   Ortho Exam  Patient is alert, oriented, no adenopathy, well-dressed, normal affect, normal respiratory effort. Left foot healing incision.  No surrounding erythema or dehiscence wound edges look healthy  Imaging: No results found. No images are attached to the encounter.  Labs: Lab Results  Component Value Date   HGBA1C 7.3 (H) 05/12/2019   HGBA1C 7.7 (H) 01/20/2019   HGBA1C 7.0 (H) 10/20/2018   ESRSEDRATE 8 07/15/2019   ESRSEDRATE 56 (H) 06/01/2015   CRP 48 (H) 07/15/2019   REPTSTATUS 07/29/2019 FINAL 07/24/2019   GRAMSTAIN  07/24/2019    RARE WBC PRESENT, PREDOMINANTLY MONONUCLEAR NO ORGANISMS SEEN    CULT  07/24/2019    RARE STAPHYLOCOCCUS CAPRAE NO ANAEROBES ISOLATED Performed at [facility], [address] [place], [place] [address]    [facility] STAPHYLOCOCCUS CAPRAE 07/24/2019     Lab Results  Component Value Date   ALBUMIN 4.2 05/12/2019   ALBUMIN 3.9 01/20/2019   ALBUMIN 4.2 10/20/2018    No results found for: MG Lab Results  Component Value Date   VD25OH 16 (L) 11/05/2013    No results found for: PREALBUMIN CBC EXTENDED  Latest Ref Rng & Units 07/15/2019 06/07/2015 06/07/2015  WBC 3.4 - 10.8 x10E3/uL 11.8(H) 13.2(H) 12.2(H)  RBC 4.14 - 5.80 x10E6/uL 5.23 4.57 4.51  HGB 13.0 - 17.7 g/dL 02.4 09.7 35.3  HCT 29.9 - 51.0 % 47.4 41.7 41.3  PLT 150 - 450 x10E3/uL 383 435(H) 409(H)  NEUTROABS 1.4 - 7.0 x10E3/uL 8.2(H) 8.9(H) -  LYMPHSABS 0.7 - 3.1 x10E3/uL 2.2 2.5 -     Body mass index is 28.49 kg/m.  Orders:  No orders of the defined types were placed in this encounter.  No orders of the defined types were placed in this encounter.    Procedures: No procedures performed  Clinical Data: No additional findings.  ROS:  All other systems negative, except as noted in the HPI. Review of Systems  Objective: Vital Signs: Ht 6' (1.829 m)   Wt 210 lb 1.6 oz (95.3 kg)   BMI 28.49 kg/m   Specialty Comments:  No specialty comments available.  PMFS History: Patient Active Problem List   Diagnosis Date Noted  . Cutaneous abscess of left foot   . Diastasis of rectus abdominis 07/16/2019  . 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 Essential hypertension 06/19/2018  . Peripheral neuropathy  12/19/2017  . Non-pressure chronic ulcer of right calf, limited to breakdown of skin ([place]) 04/17/2017  . Acquired absence of right leg below knee ([place]) 04/17/2017  . Carpal tunnel syndrome 03/18/2017  . Colon cancer screening 06/21/2015  . Status post below knee amputation of right lower extremity ([place]) 06/07/2015  . 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 Diabetic polyneuropathy associated with type 2 diabetes mellitus ([place]) 12/09/2014  . Diabetes mellitus with carpal tunnel syndrome ([place]) 12/09/2014  . DJD (degenerative joint disease) of knee 07/21/2014  . Osteoarthritis, hip, bilateral 05/25/2014  . Pulmonary emboli ([place]) 02/08/2014  . DVT (deep venous thrombosis) ([place]) 02/08/2014  .  Onychomycosis 10/14/2013  . Pure hypercholesterolemia 09/25/2013  . Diabetic foot ulcer ([place]) 09/15/2013  . Sebaceous cyst 08/16/2011  . Poorly controlled type 2 diabetes mellitus with complication ([place]) 73/71/0626   Past Medical History:  Diagnosis Date  . Arthritis    bilateral hips  . Deep vein thrombosis (DVT) ([place])   . Diabetes mellitus    type II  . 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 DJD (degenerative joint disease)   . Pulmonary emboli ([place]) 02/08/2014   Date of diagnosis February 08 2014, on chest CTA Hospitalized for 3 days Had some symptoms of shortness of breath, and chest pain With intercurrent DVT of the left LE. Duration of anticoagulation: 8 months. End date 10/11/2014.  Anticoagulant: Lovenox 120 units daily Switched to Eliquis on 05/25/2014 per patient preference   . Pulmonary embolism ([place])   . Sebaceous cyst    on back of neck    Family History  Problem Relation Age of Onset  . Cancer Mother        breast, colon, liver  . Diabetes Father     Past Surgical History:  Procedure Laterality Date  . AMPUTATION Right 06/03/2015   Procedure: Right Below Knee Amputation;  Surgeon: [name], MD;  Location: [facility];  Service: Orthopedics;  Laterality: Right;  . AMPUTATION Left 07/24/2019   Procedure: LEFT FOOT 3RD RAY AMPUTATION;  Surgeon: [name], MD;  Location: [facility];  Service: Orthopedics;  Laterality: Left;  . CLOSED REDUCTION WITH HUMER PIN INSERTION  1974   left hip  . HARDWARE REMOVAL Left 07/21/2014   Procedure: HARDWARE REMOVAL;  Surgeon: Ninetta Lights, MD;  Location: [facility];  Service: Orthopedics;  Laterality: Left;  . JOINT REPLACEMENT  2006 (approx)   right hip replaced  . ROTATOR CUFF REPAIR  2005 (approx)   right   . 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 TOTAL HIP ARTHROPLASTY Left 07/21/2014   Procedure: TOTAL HIP ARTHROPLASTY ANTERIOR APPROACH;  Surgeon: Ninetta Lights, MD;  Location: [facility];  Service: Orthopedics;   Laterality: Left;   Social History   Occupational History  . Not on file  Tobacco Use  . Smoking status: Never Smoker  . Smokeless tobacco: Never Used  Substance and Sexual Activity  . Alcohol use: Yes    Alcohol/week: 0.0 standard drinks    Comment: beer and mixed drink maybe 1 - 2 times a month  . Drug use: No  . Sexual activity: Never

## 2019-08-05 ENCOUNTER — Other Ambulatory Visit: Payer: Self-pay | Admitting: Physician Assistant

## 2019-08-05 ENCOUNTER — Telehealth: Payer: Self-pay | Admitting: Physician Assistant

## 2019-08-05 MED ORDER — OXYCODONE-ACETAMINOPHEN 5-325 MG PO TABS: 1.0000 | ORAL_TABLET | ORAL | 0 refills | Status: DC | PRN

## 2019-08-05 NOTE — Telephone Encounter (Signed)
Medication has been refilled.

## 2019-08-05 NOTE — Telephone Encounter (Signed)
Patient called needing Rx refilled (Oxycodone) Patient is trying to get Rx before the weekend. The number to contact patient is [phone]

## 2019-08-05 NOTE — Telephone Encounter (Signed)
Pt is calling for refill on Oxycodone 5/325. Last refill was qty # 30 07/30/19 and # 30 on 07/24/19

## 2019-08-10 ENCOUNTER — Telehealth: Payer: Self-pay | Admitting: Orthopedic Surgery

## 2019-08-10 NOTE — Telephone Encounter (Signed)
Patient called. He has questions regarding his recent amputation and caring for it.  Call back number: [phone]

## 2019-08-10 NOTE — Telephone Encounter (Signed)
Patient was called and informed to continue to wash foot with soap and water, pat dry and use dry guaze to incision and areas of moisture. Continue to elevate and if this proceeds with concerns to call our office and make an appt with [place]. Patient understood orders and will do so if needed.

## 2019-08-11 ENCOUNTER — Ambulatory Visit: Payer: Medicare HMO | Attending: Internal Medicine

## 2019-08-11 DIAGNOSIS — Z20822 Contact with and (suspected) exposure to covid-19: Secondary | ICD-10-CM

## 2019-08-11 DIAGNOSIS — Z20828 Contact with and (suspected) exposure to other viral communicable diseases: Secondary | ICD-10-CM | POA: Diagnosis not present

## 2019-08-12 LAB — NOVEL CORONAVIRUS, NAA: SARS-CoV-2, NAA: DETECTED — AB

## 2019-08-13 ENCOUNTER — Telehealth: Payer: Self-pay | Admitting: Orthopedic Surgery

## 2019-08-13 ENCOUNTER — Telehealth: Payer: Self-pay | Admitting: Nurse Practitioner

## 2019-08-13 NOTE — Telephone Encounter (Signed)
Patient called advised he had tested positive for covid and will be ib quarantine 10 days Saturday. Patient asked if he should keep his appointment Monday?  Patient said his foot has a little drainage and it is not warm to the touch. Patient aid because of covid he do not have any smell or taste. The number to contact patient is [phone]

## 2019-08-13 NOTE — Telephone Encounter (Signed)
Peter requires patient to reschedule appt 2 weeks out from positive COVID testing. Can you please call patient and notify him of this, thanks.

## 2019-08-13 NOTE — Telephone Encounter (Signed)
Called to Discuss with patient about Covid symptoms and the use of bamlanivimab, a monoclonal antibody infusion for those with mild to moderate Covid symptoms and at a high risk of hospitalization.     Pt is qualified for this infusion at the [facility] infusion center due to co-morbid conditions and/or a member of an at-risk group.    Patient Active Problem List   Diagnosis Date Noted  . Cutaneous abscess of left foot   . Diastasis of rectus abdominis 07/16/2019  . 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 Essential hypertension 06/19/2018  . Peripheral neuropathy 12/19/2017  . Non-pressure chronic ulcer of right calf, limited to breakdown of skin ([place]) 04/17/2017  . Acquired absence of right leg below knee ([place]) 04/17/2017  . Carpal tunnel syndrome 03/18/2017  . Colon cancer screening 06/21/2015  . Status post below knee amputation of right lower extremity ([place]) 06/07/2015  . 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 ([place]) 06/01/2015  . Diabetic polyneuropathy associated with type 2 diabetes mellitus ([place]) 12/09/2014  . Diabetes mellitus with carpal tunnel syndrome ([place]) 12/09/2014  . DJD (degenerative joint disease) of knee 07/21/2014  . Osteoarthritis, hip, bilateral 05/25/2014  . Pulmonary emboli ([place]) 02/08/2014  . DVT (deep venous thrombosis) ([place]) 02/08/2014  . Onychomycosis 10/14/2013  . Pure hypercholesterolemia 09/25/2013  . Diabetic foot ulcer ([place]) 09/15/2013  . Sebaceous cyst 08/16/2011  . Poorly controlled type 2 diabetes mellitus with complication ([place]) 56/38/7564      Unable to reach pt

## 2019-08-17 ENCOUNTER — Other Ambulatory Visit: Payer: Medicare HMO

## 2019-08-17 ENCOUNTER — Other Ambulatory Visit: Payer: Self-pay | Admitting: Endocrinology

## 2019-08-17 ENCOUNTER — Ambulatory Visit: Payer: Medicare HMO | Admitting: Orthopedic Surgery

## 2019-08-19 ENCOUNTER — Telehealth: Payer: Self-pay | Admitting: Orthopedic Surgery

## 2019-08-19 NOTE — Telephone Encounter (Signed)
I have called pt and he tested positive on 08/11/19. He is currently still having symptoms and I advised that we would have to discuss with management how to move forward from here. Will hold message and call back once I have been advised if we will see the pt curbside.

## 2019-08-19 NOTE — Telephone Encounter (Signed)
Patient called in requesting call back from Dr. Audrie Lia nurse. Patient claims to have had Covid-19 but has not retested for negative results.I ( [place]) explained we would prefer him to get retested to be sure he is still not positive with Covid- 19. Patient states he feels good and hasn't had a fever in over a week. Patient states toes have been amputated and some discharge and smell has been coming from wound. Patient is asking for call back. Patient has several questions. Patient phone number is [phone].

## 2019-08-19 NOTE — Telephone Encounter (Signed)
I called pt to advise that we will see him tomorrow at 11am. He is to call the office and let us know that he has arrived and we will come out to the car to see him. He is to be wearing a mask and will be given an N95 upon arrival. Everyone in the car with him is to be wearing a mask.

## 2019-08-20 ENCOUNTER — Ambulatory Visit: Payer: Medicare HMO | Admitting: Endocrinology

## 2019-08-20 ENCOUNTER — Encounter: Payer: Self-pay | Admitting: Orthopedic Surgery

## 2019-08-20 ENCOUNTER — Ambulatory Visit (INDEPENDENT_AMBULATORY_CARE_PROVIDER_SITE_OTHER): Payer: Medicare HMO | Admitting: Orthopedic Surgery

## 2019-08-20 VITALS — Ht 72.0 in | Wt 210.0 lb

## 2019-08-20 DIAGNOSIS — M869 Osteomyelitis, unspecified: Secondary | ICD-10-CM

## 2019-08-20 NOTE — Progress Notes (Signed)
Office Visit Note   Patient: Gregory May           Date of Birth: 1960-10-13           MRN: [id number] Visit Date: 08/20/2019              Requested by: Kathi Ludwig, MD [address] [place],  [place] [address] PCP: Kathi Ludwig, MD  Chief Complaint  Patient presents with  . Left Foot - Routine Post Op    07/24/19 left foot 3rd ray amputation       HPI: This is a pleasant gentleman who is approximately 4 weeks status post left foot third ray amputation.  He contacted [place] yesterday because he thought he had some increased drainage.  He thought it might have a foul odor but he currently has COVID-19 and is unsure if this is reliable as his sense of smell has changed he denies any fever or chills  Assessment & Plan: Visit Diagnoses: No diagnosis found.  Plan: He will continue to do daily dressing changes and wash with Dial soap follow-up in 1 week we also reiterated the importance of not placing weight on his foot he admits that he has been putting a little weight on his foot but will try and do better  Follow-Up Instructions: No follow-ups on file.   Ortho Exam  Patient is alert, oriented, no adenopathy, well-dressed, normal affect, normal respiratory effort. Focused examination of the wound demonstrates no cellulitis some maceration of the skin with fibrinous healing no foul odor or purulent drainage patient appears well  Imaging: No results found. No images are attached to the encounter.  Labs: Lab Results  Component Value Date   HGBA1C 7.3 (H) 05/12/2019   HGBA1C 7.7 (H) 01/20/2019   HGBA1C 7.0 (H) 10/20/2018   ESRSEDRATE 8 07/15/2019   ESRSEDRATE 56 (H) 06/01/2015   CRP 48 (H) 07/15/2019   REPTSTATUS 07/29/2019 FINAL 07/24/2019   GRAMSTAIN  07/24/2019    RARE WBC PRESENT, PREDOMINANTLY MONONUCLEAR NO ORGANISMS SEEN    CULT  07/24/2019    RARE STAPHYLOCOCCUS CAPRAE NO ANAEROBES ISOLATED Performed at [facility], [place] [address],  [place], [place] [address]    [place] STAPHYLOCOCCUS CAPRAE 07/24/2019     Lab Results  Component Value Date   ALBUMIN 4.2 05/12/2019   ALBUMIN 3.9 01/20/2019   ALBUMIN 4.2 10/20/2018    No results found for: MG Lab Results  Component Value Date   VD25OH 16 (L) 11/05/2013    No results found for: PREALBUMIN CBC EXTENDED Latest Ref Rng & Units 07/15/2019 06/07/2015 06/07/2015  WBC 3.4 - 10.8 x10E3/uL 11.8(H) 13.2(H) 12.2(H)  RBC 4.14 - 5.80 x10E6/uL 5.23 4.57 4.51  HGB 13.0 - 17.7 g/dL 16.2 13.9 13.2  HCT 37.5 - 51.0 % 47.4 41.7 41.3  PLT 150 - 450 x10E3/uL 383 435(H) 409(H)  NEUTROABS 1.4 - 7.0 x10E3/uL 8.2(H) 8.9(H) -  LYMPHSABS 0.7 - 3.1 x10E3/uL 2.2 2.5 -     Body mass index is 28.48 kg/m.  Orders:  No orders of the defined types were placed in this encounter.  No orders of the defined types were placed in this encounter.    Procedures: No procedures performed  Clinical Data: No additional findings.  ROS:  All other systems negative, except as noted in the HPI. Review of Systems  Objective: Vital Signs: Ht 6' (1.829 m)   Wt 210 lb (95.3 kg)   BMI 28.48 kg/m   Specialty Comments:  No specialty comments available.  PMFS History: Patient Active Problem List   Diagnosis Date Noted  . Cutaneous abscess of left foot   . Diastasis of rectus abdominis 07/16/2019  . 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 Essential hypertension 06/19/2018  . Peripheral neuropathy 12/19/2017  . Non-pressure chronic ulcer of right calf, limited to breakdown of skin (HCC) 04/17/2017  . Acquired absence of right leg below knee (HCC) 04/17/2017  . Carpal tunnel syndrome 03/18/2017  . Colon cancer screening 06/21/2015  . Status post below knee amputation of right lower extremity (HCC) 06/07/2015  . 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 Diabetic polyneuropathy  associated with type 2 diabetes mellitus (HCC) 12/09/2014  . Diabetes mellitus with carpal tunnel syndrome (HCC) 12/09/2014  . DJD (degenerative joint disease) of knee 07/21/2014  . Osteoarthritis, hip, bilateral 05/25/2014  . Pulmonary emboli (HCC) 02/08/2014  . DVT (deep venous thrombosis) (HCC) 02/08/2014  . Onychomycosis 10/14/2013  . Pure hypercholesterolemia 09/25/2013  . Diabetic foot ulcer (HCC) 09/15/2013  . Sebaceous cyst 08/16/2011  . Poorly controlled type 2 diabetes mellitus with complication (HCC) 09/15/1997   Past Medical History:  Diagnosis Date  . Arthritis    bilateral hips  . Deep vein thrombosis (DVT) (HCC)   . Diabetes mellitus    type II  . 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 DJD (degenerative joint disease)   . Pulmonary emboli (HCC) 02/08/2014   Date of diagnosis February 08 2014, on chest CTA Hospitalized for 3 days Had some symptoms of shortness of breath, and chest pain With intercurrent DVT of the left LE. Duration of anticoagulation: 8 months. End date 10/11/2014.  Anticoagulant: Lovenox 120 units daily Switched to Eliquis on 05/25/2014 per patient preference   . Pulmonary embolism (HCC)   . Sebaceous cyst    on back of neck    Family History  Problem Relation Age of Onset  . Cancer Mother        breast, colon, liver  . Diabetes Father     Past Surgical History:  Procedure Laterality Date  . AMPUTATION Right 06/03/2015   Procedure: Right Below Knee Amputation;  Surgeon: Nadara Mustard, MD;  Location: [facility] OR;  Service: Orthopedics;  Laterality: Right;  . AMPUTATION Left 07/24/2019   Procedure: LEFT FOOT 3RD RAY AMPUTATION;  Surgeon: Nadara Mustard, MD;  Location: [facility] OR;  Service: Orthopedics;  Laterality: Left;  . CLOSED REDUCTION WITH HUMER PIN INSERTION  1974   left hip  . HARDWARE REMOVAL Left 07/21/2014   Procedure: HARDWARE REMOVAL;  Surgeon: Loreta Ave, MD;  Location: [facility] OR;  Service: Orthopedics;  Laterality: Left;  . JOINT  REPLACEMENT  2006 (approx)   right hip replaced  . ROTATOR CUFF REPAIR  2005 (approx)   right   . 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 TOTAL HIP ARTHROPLASTY Left 07/21/2014   Procedure: TOTAL HIP ARTHROPLASTY ANTERIOR APPROACH;  Surgeon: Loreta Ave, MD;  Location: [facility] OR;  Service: Orthopedics;  Laterality: Left;   Social History   Occupational History  . Not on file  Tobacco Use  . Smoking status: Never Smoker  . Smokeless tobacco: Never Used  Substance and Sexual Activity  . Alcohol use: Yes    Alcohol/week: 0.0 standard drinks    Comment: beer and mixed drink maybe 1 - 2  times a month  . Drug use: No  . Sexual activity: Never

## 2019-08-24 ENCOUNTER — Other Ambulatory Visit: Payer: Self-pay | Admitting: Internal Medicine

## 2019-08-24 ENCOUNTER — Ambulatory Visit: Payer: Medicare HMO | Admitting: Orthopedic Surgery

## 2019-08-24 ENCOUNTER — Telehealth: Payer: Self-pay | Admitting: Internal Medicine

## 2019-08-24 DIAGNOSIS — G5600 Carpal tunnel syndrome, unspecified upper limb: Secondary | ICD-10-CM

## 2019-08-24 DIAGNOSIS — E1149 Type 2 diabetes mellitus with other diabetic neurological complication: Secondary | ICD-10-CM

## 2019-08-24 NOTE — Telephone Encounter (Signed)
Pt tested positive for COVID on 08/12/2020.  Pt recently had a toe amputation (Dr. Lajoyce Corners) and is has questions about being tested again,  Pt states he needs a negative test before he can return to Dr. Audrie Lia office.  PT states he called [facility] and was directed to contact his PCP because, he can not have another one performed for another 90 days unless an order is placed by his PCP.  Pt needs a test done by 09/03/2019.  Please call this patient back.

## 2019-08-24 NOTE — Telephone Encounter (Signed)
I am not certain how to make this happen. I am happy to order the test but am not certain the indication if it is only for an office visit. I wonder if there is a miss understanding regarding the protocol as this seems atypical.

## 2019-08-24 NOTE — Telephone Encounter (Signed)
Triage called dr duda's office and was told that no that is not the case, that pt just needs to quarantine for 14 days and then he may come to visits. Called pt and he states the nurse at dr duda's told him he would need a negative test. He is advised to call dr duda's office and clarify instructions, he is agreeable

## 2019-09-01 ENCOUNTER — Other Ambulatory Visit: Payer: Self-pay | Admitting: Endocrinology

## 2019-09-03 ENCOUNTER — Ambulatory Visit: Payer: Medicare HMO | Admitting: Orthopedic Surgery

## 2019-09-07 ENCOUNTER — Other Ambulatory Visit (INDEPENDENT_AMBULATORY_CARE_PROVIDER_SITE_OTHER): Payer: Medicare HMO

## 2019-09-07 ENCOUNTER — Other Ambulatory Visit: Payer: Self-pay

## 2019-09-07 DIAGNOSIS — E1165 Type 2 diabetes mellitus with hyperglycemia: Secondary | ICD-10-CM | POA: Diagnosis not present

## 2019-09-07 DIAGNOSIS — Z794 Long term (current) use of insulin: Secondary | ICD-10-CM | POA: Diagnosis not present

## 2019-09-07 LAB — COMPREHENSIVE METABOLIC PANEL
ALT: 52 U/L (ref 0–53)
AST: 35 U/L (ref 0–37)
Albumin: 4.5 g/dL (ref 3.5–5.2)
Alkaline Phosphatase: 58 U/L (ref 39–117)
BUN: 15 mg/dL (ref 6–23)
CO2: 26 mEq/L (ref 19–32)
Calcium: 9.8 mg/dL (ref 8.4–10.5)
Chloride: 100 mEq/L (ref 96–112)
Creatinine, Ser: 1.03 mg/dL (ref 0.40–1.50)
GFR: 74.46 mL/min (ref >60.00)
Glucose, Bld: 110 mg/dL — ABNORMAL HIGH (ref 70–99)
Potassium: 4.3 mEq/L (ref 3.5–5.1)
Sodium: 136 mEq/L (ref 135–145)
Total Bilirubin: 0.3 mg/dL (ref 0.2–1.2)
Total Protein: 7.9 g/dL (ref 6.0–8.3)

## 2019-09-07 LAB — HEMOGLOBIN A1C: Hgb A1c MFr Bld: 6.8 % — ABNORMAL HIGH (ref 4.6–6.5)

## 2019-09-08 ENCOUNTER — Other Ambulatory Visit: Payer: Self-pay

## 2019-09-08 ENCOUNTER — Ambulatory Visit (INDEPENDENT_AMBULATORY_CARE_PROVIDER_SITE_OTHER): Payer: Medicare HMO | Admitting: Orthopedic Surgery

## 2019-09-08 ENCOUNTER — Encounter: Payer: Self-pay | Admitting: Orthopedic Surgery

## 2019-09-08 VITALS — Ht 72.0 in | Wt 210.0 lb

## 2019-09-08 DIAGNOSIS — M869 Osteomyelitis, unspecified: Secondary | ICD-10-CM

## 2019-09-08 NOTE — Progress Notes (Signed)
Office Visit Note   Patient: Gregory May           Date of Birth: 12-12-1960           MRN: [id number] Visit Date: 09/08/2019              Requested by: Lanelle Bal, MD [address] [place],  [place] [address] PCP: Lanelle Bal, MD  Chief Complaint  Patient presents with  . Left Foot - Routine Post Op    07/24/19 left foot 3rd ray amputation       HPI: The patient is a 59 year old gentleman seen today status post left foot third ray amputation on December 11.  He states he has been attempting to minimize his weightbearing on the left foot is full weightbearing today does have crutches today.  He has been doing dry dressing changes.  Assessment & Plan: Visit Diagnoses:  1. 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)     Plan: He will continue to do daily dressing changes and wash with Dial soap.  Dry dressings.  Pack open.  Minimize weightbearing. Follow-Up Instructions: Return in about 2 weeks (around 09/22/2019).   Ortho Exam  Patient is alert, oriented, no adenopathy, well-dressed, normal affect, normal respiratory effort. The dorsal and plantar aspect of his incision is well-healed however in the webspace there is a 8 mm in diameter open area that has not yet healed there is fibrinous tissue in the wound bed this does not probe to bone or tendon today.  No active drainage no swelling of the foot.  Imaging: No results found. No images are attached to the encounter.  Labs: Lab Results  Component Value Date   HGBA1C 6.8 (H) 09/07/2019   HGBA1C 7.3 (H) 05/12/2019   HGBA1C 7.7 (H) 01/20/2019   ESRSEDRATE 8 07/15/2019   ESRSEDRATE 56 (H) 06/01/2015   CRP 48 (H) 07/15/2019   REPTSTATUS 07/29/2019 FINAL 07/24/2019   GRAMSTAIN  07/24/2019    RARE WBC PRESENT, PREDOMINANTLY MONONUCLEAR NO ORGANISMS SEEN    CULT  07/24/2019    RARE STAPHYLOCOCCUS CAPRAE NO ANAEROBES ISOLATED Performed at [facility], [address] [place], [place]  [address]    [facility] STAPHYLOCOCCUS CAPRAE 07/24/2019     Lab Results  Component Value Date   ALBUMIN 4.5 09/07/2019   ALBUMIN 4.2 05/12/2019   ALBUMIN 3.9 01/20/2019    No results found for: MG Lab Results  Component Value Date   VD25OH 16 (L) 11/05/2013    No results found for: PREALBUMIN CBC EXTENDED Latest Ref Rng & Units 07/15/2019 06/07/2015 06/07/2015  WBC 3.4 - 10.8 x10E3/uL 11.8(H) 13.2(H) 12.2(H)  RBC 4.14 - 5.80 x10E6/uL 5.23 4.57 4.51  HGB 13.0 - 17.7 g/dL 94.8 54.6 27.0  HCT 35.0 - 51.0 % 47.4 41.7 41.3  PLT 150 - 450 x10E3/uL 383 435(H) 409(H)  NEUTROABS 1.4 - 7.0 x10E3/uL 8.2(H) 8.9(H) -  LYMPHSABS 0.7 - 3.1 x10E3/uL 2.2 2.5 -     Body mass index is 28.48 kg/m.  Orders:  No orders of the defined types were placed in this encounter.  No orders of the defined types were placed in this encounter.    Procedures: No procedures performed  Clinical Data: No additional findings.  ROS:  All other systems negative, except as noted in the HPI. Review of Systems  Objective: Vital Signs: Ht 6' (1.829 m)   Wt 210 lb (95.3 kg)   BMI 28.48 kg/m   Specialty Comments:  No specialty comments available.  PMFS History: Patient Active Problem List   Diagnosis Date Noted  . Cutaneous abscess of left foot   . Diastasis of rectus abdominis 07/16/2019  . 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 Essential hypertension 06/19/2018  . Peripheral neuropathy 12/19/2017  . Non-pressure chronic ulcer of right calf, limited to breakdown of skin ([place]) 04/17/2017  . Acquired absence of right leg below knee ([place]) 04/17/2017  . Carpal tunnel syndrome 03/18/2017  . Colon cancer screening 06/21/2015  . Status post below knee amputation of right lower extremity ([place]) 06/07/2015  . 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 ([place]) 06/01/2015  . Diabetic polyneuropathy associated with  type 2 diabetes mellitus ([place]) 12/09/2014  . Diabetes mellitus with carpal tunnel syndrome ([place]) 12/09/2014  . DJD (degenerative joint disease) of knee 07/21/2014  . Osteoarthritis, hip, bilateral 05/25/2014  . Pulmonary emboli ([place]) 02/08/2014  . DVT (deep venous thrombosis) ([place]) 02/08/2014  . Onychomycosis 10/14/2013  . Pure hypercholesterolemia 09/25/2013  . Diabetic foot ulcer ([place]) 09/15/2013  . Sebaceous cyst 08/16/2011  . Poorly controlled type 2 diabetes mellitus with complication ([place]) 79/09/4095   Past Medical History:  Diagnosis Date  . Arthritis    bilateral hips  . Deep vein thrombosis (DVT) ([place])   . Diabetes mellitus    type II  . 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 DJD (degenerative joint disease)   . Pulmonary emboli ([place]) 02/08/2014   Date of diagnosis February 08 2014, on chest CTA Hospitalized for 3 days Had some symptoms of shortness of breath, and chest pain With intercurrent DVT of the left LE. Duration of anticoagulation: 8 months. End date 10/11/2014.  Anticoagulant: Lovenox 120 units daily Switched to Eliquis on 05/25/2014 per patient preference   . Pulmonary embolism ([place])   . Sebaceous cyst    on back of neck    Family History  Problem Relation Age of Onset  . Cancer Mother        breast, colon, liver  . Diabetes Father     Past Surgical History:  Procedure Laterality Date  . AMPUTATION Right 06/03/2015   Procedure: Right Below Knee Amputation;  Surgeon: [name], MD;  Location: [facility];  Service: Orthopedics;  Laterality: Right;  . AMPUTATION Left 07/24/2019   Procedure: LEFT FOOT 3RD RAY AMPUTATION;  Surgeon: [name], MD;  Location: [facility];  Service: Orthopedics;  Laterality: Left;  . CLOSED REDUCTION WITH HUMER PIN INSERTION  1974   left hip  . HARDWARE REMOVAL Left 07/21/2014   Procedure: HARDWARE REMOVAL;  Surgeon: Ninetta Lights, MD;  Location: [facility];  Service: Orthopedics;  Laterality: Left;  . JOINT REPLACEMENT  2006  (approx)   right hip replaced  . ROTATOR CUFF REPAIR  2005 (approx)   right   . 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 TOTAL HIP ARTHROPLASTY Left 07/21/2014   Procedure: TOTAL HIP ARTHROPLASTY ANTERIOR APPROACH;  Surgeon: Ninetta Lights, MD;  Location: [facility];  Service: Orthopedics;  Laterality: Left;   Social History   Occupational History  . Not on file  Tobacco Use  . Smoking status: Never Smoker  . Smokeless tobacco: Never Used  Substance and Sexual Activity  . Alcohol use: Yes    Alcohol/week: 0.0 standard drinks    Comment: beer and mixed drink maybe 1 - 2  times a month  . Drug use: No  . Sexual activity: Never

## 2019-09-10 ENCOUNTER — Encounter: Payer: Self-pay | Admitting: Endocrinology

## 2019-09-10 ENCOUNTER — Other Ambulatory Visit: Payer: Self-pay

## 2019-09-10 ENCOUNTER — Ambulatory Visit (INDEPENDENT_AMBULATORY_CARE_PROVIDER_SITE_OTHER): Payer: Medicare HMO | Admitting: Endocrinology

## 2019-09-10 VITALS — BP 140/70 | HR 83 | Ht 72.0 in | Wt 211.4 lb

## 2019-09-10 DIAGNOSIS — E7800 Pure hypercholesterolemia, unspecified: Secondary | ICD-10-CM | POA: Diagnosis not present

## 2019-09-10 DIAGNOSIS — E1142 Type 2 diabetes mellitus with diabetic polyneuropathy: Secondary | ICD-10-CM | POA: Diagnosis not present

## 2019-09-10 DIAGNOSIS — Z89511 Acquired absence of right leg below knee: Secondary | ICD-10-CM | POA: Diagnosis not present

## 2019-09-10 DIAGNOSIS — E1165 Type 2 diabetes mellitus with hyperglycemia: Secondary | ICD-10-CM | POA: Diagnosis not present

## 2019-09-10 DIAGNOSIS — Z794 Long term (current) use of insulin: Secondary | ICD-10-CM | POA: Diagnosis not present

## 2019-09-10 DIAGNOSIS — E1140 Type 2 diabetes mellitus with diabetic neuropathy, unspecified: Secondary | ICD-10-CM | POA: Diagnosis not present

## 2019-09-10 MED ORDER — ACCU-CHEK FASTCLIX LANCETS MISC: 2 refills | Status: AC

## 2019-09-10 MED ORDER — ACCU-CHEK GUIDE VI STRP: ORAL_STRIP | 2 refills | Status: DC

## 2019-09-10 NOTE — Progress Notes (Signed)
Hank T Hulick 59 y.o.           Reason for Appointment: follow-up   History of Present Illness   Diagnosis: Type 2 DIABETES MELITUS, date of diagnosis:  1999     Previous history: He has previously been treated with metformin and Victoza and subsequently mealtime insulin added to control postprandial hyperglycemia Overall he has been  relatively noncompliant with his diet, medications, monitoring and followup Previously would not take Victoza regularly because of cost. Has not been taking any medications for the last year and a half because of commitments to family and cost A1c had increased to 12.3% and hyperglycemia discovered when he was hospitalized for foot ulcer. Also lost 20 pounds because of hyperglycemia   For better control, compliance and inconvenience he was switched to the V-go pump on 02/21/16  Recent history:   Insulin regimen: V-go 20 unit basal with Novolog insulin, mealtime boluses 12-16 breakfast --- 12 -20 units before dinner  Non-insulin hypoglycemic drugs: Metformin ER 1500 mg daily    Jardiance 10 mg daily    A1c  is now 6.8, was 7.3 and progressively improving     Management, blood sugar patterns and problems identified.  He was able to switch to NovoLog insulin for his pump now  Also continues on Jardiance and Metformin unchanged  He did not have a functioning meter to evaluate today and difficult to know what his blood sugar patterns are  However he thinks he is trying to be generally cautious with his diet although not able to be consistent because of living with a relative  May have had higher readings in December but he thinks that postprandial readings are also fairly good now  No hypoglycemia at any time  Only rarely will run out of boluses if he is getting more carbohydrates or snacks and then will change his pump sooner  Usually taking more boluses in the evening if eating larger meals or more carbohydrates and also some for  snacks  Changing pump at the same time in am usually    He is able to start on Jardiance through patient assistance program and recent renal function is normal       Side effects from medications: None       Monitors blood glucose: Once a day .  Glucometer: One Touch.           Blood Glucose readings by recall:    PRE-MEAL Fasting Lunch Dinner Bedtime Overall  Glucose range: 100-140      Mean/median:        POST-MEAL PC Breakfast PC Lunch PC Dinner  Glucose range:   140-200  Mean/median:      PREVIOUS readings: FASTING range 88-140 After dinner mostly 150-160, highest 179  Meals:  usually 2 meals per day usually.  breakfast at 12 noon, evening meal 7-9 pm, variable snacks late at night    Physical activity: exercise: Minimal               Weight control:   Wt Readings from Last 3 Encounters:  09/10/19 211 lb 6.4 oz (95.9 kg)  09/08/19 210 lb (95.3 kg)  08/20/19 210 lb (95.3 kg)          Diabetes labs:  Lab Results  Component Value Date   HGBA1C 6.8 (H) 09/07/2019   HGBA1C 7.3 (H) 05/12/2019   HGBA1C 7.7 (H) 01/20/2019   Lab Results  Component Value Date   MICROALBUR 0.9 01/20/2019   [place]  90 05/12/2019   CREATININE 1.03 09/07/2019    Lab Results  Component Value Date   FRUCTOSAMINE 272 03/03/2019   FRUCTOSAMINE 243 03/07/2017   FRUCTOSAMINE 274 10/30/2016     Other active problems: See review of systems    Lab on 09/07/2019  Component Date Value Ref Range Status  . Sodium 09/07/2019 136  135 - 145 mEq/L Final  . Potassium 09/07/2019 4.3  3.5 - 5.1 mEq/L Final  . Chloride 09/07/2019 100  96 - 112 mEq/L Final  . CO2 09/07/2019 26  19 - 32 mEq/L Final  . Glucose, Bld 09/07/2019 110* 70 - 99 mg/dL Final  . BUN 09/07/2019 15  6 - 23 mg/dL Final  . Creatinine, Ser 09/07/2019 1.03  0.40 - 1.50 mg/dL Final  . Total Bilirubin 09/07/2019 0.3  0.2 - 1.2 mg/dL Final  . Alkaline Phosphatase 09/07/2019 58  39 - 117 U/L Final  . AST 09/07/2019 35  0 -  37 U/L Final  . ALT 09/07/2019 52  0 - 53 U/L Final  . Total Protein 09/07/2019 7.9  6.0 - 8.3 g/dL Final  . Albumin 09/07/2019 4.5  3.5 - 5.2 g/dL Final  . GFR 09/07/2019 74.46  >60.00 mL/min Final  . Calcium 09/07/2019 9.8  8.4 - 10.5 mg/dL Final  . Hgb A1c MFr Bld 09/07/2019 6.8* 4.6 - 6.5 % Final   Glycemic Control Guidelines for People with Diabetes:Non Diabetic:  <6%Goal of Therapy: <7%Additional Action Suggested:  >8%      Allergies as of 09/10/2019   No Known Allergies     Medication List       Accurate as of September 10, 2019  3:45 PM. If you have any questions, ask your nurse or doctor.        Accu-Chek FastClix Lancets Misc 1 each by Does not apply route daily.   Accu-Chek Guide Me w/Device Kit 1 each by Does not apply route daily.   acetaminophen 325 MG tablet Commonly known as: TYLENOL Take 650 mg by mouth every 6 (six) hours as needed (pain).   doxycycline 100 MG tablet Commonly known as: VIBRA-TABS Take 1 tablet (100 mg total) by mouth 2 (two) times daily.   gabapentin 300 MG capsule Commonly known as: NEURONTIN TAKE 1 CAPSULE BY MOUTH THREE TIMES DAILY   glucose blood test strip Commonly known as: Accu-Chek Guide 1 each by Other route as needed for other. Use as instructed to check blood sugar twice daily.   insulin aspart 100 UNIT/ML injection Commonly known as: novoLOG Max of 56 units daily in V-Go pump   Jardiance 10 MG Tabs tablet Generic drug: empagliflozin Take 10 mg by mouth daily.   metFORMIN 750 MG 24 hr tablet Commonly known as: GLUCOPHAGE-XR Take 2 tablets by mouth once daily   oxyCODONE-acetaminophen 5-325 MG tablet Commonly known as: Percocet Take 1 tablet by mouth every 4 (four) hours as needed for severe pain.   pravastatin 40 MG tablet Commonly known as: PRAVACHOL Take 40 mg by mouth daily. Take 1 tablet by mouth once daily.   V-Go 20 Kit APPLY V-GO 20 PUMP TO ARM ONE TIME DAILY AS DIRECTED       Allergies: No Known  Allergies  Past Medical History:  Diagnosis Date  . Arthritis    bilateral hips  . Deep vein thrombosis (DVT) ([place])   . Diabetes mellitus    type II  . 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 DJD (  degenerative joint disease)   . Pulmonary emboli ([place]) 02/08/2014   Date of diagnosis February 08 2014, on chest CTA Hospitalized for 3 days Had some symptoms of shortness of breath, and chest pain With intercurrent DVT of the left LE. Duration of anticoagulation: 8 months. End date 10/11/2014.  Anticoagulant: Lovenox 120 units daily Switched to Eliquis on 05/25/2014 per patient preference   . Pulmonary embolism ([place])   . Sebaceous cyst    on back of neck    Past Surgical History:  Procedure Laterality Date  . AMPUTATION Right 06/03/2015   Procedure: Right Below Knee Amputation;  Surgeon: [name], MD;  Location: [facility];  Service: Orthopedics;  Laterality: Right;  . AMPUTATION Left 07/24/2019   Procedure: LEFT FOOT 3RD RAY AMPUTATION;  Surgeon: [name], MD;  Location: [facility];  Service: Orthopedics;  Laterality: Left;  . CLOSED REDUCTION WITH HUMER PIN INSERTION  1974   left hip  . HARDWARE REMOVAL Left 07/21/2014   Procedure: HARDWARE REMOVAL;  Surgeon: Ninetta Lights, MD;  Location: [facility];  Service: Orthopedics;  Laterality: Left;  . JOINT REPLACEMENT  2006 (approx)   right hip replaced  . ROTATOR CUFF REPAIR  2005 (approx)   right   . 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 TOTAL HIP ARTHROPLASTY Left 07/21/2014   Procedure: TOTAL HIP ARTHROPLASTY ANTERIOR APPROACH;  Surgeon: Ninetta Lights, MD;  Location: [facility];  Service: Orthopedics;  Laterality: Left;    Family History  Problem Relation Age of Onset  . Cancer Mother        breast, colon, liver  . Diabetes Father     Social History:  reports that he has never smoked. He has never used smokeless tobacco. He reports current alcohol use. He reports that he does not use drugs.  Review of  Systems:   Lipids: He is on Pravachol  with generally good control of LDL   Lab Results  Component Value Date   CHOL 164 05/12/2019   CHOL 170 01/20/2019   CHOL 161 10/20/2018   Lab Results  Component Value Date   HDL 44.00 05/12/2019   HDL 41.90 01/20/2019   HDL 47.70 10/20/2018   Lab Results  Component Value Date   LDLCALC 90 05/12/2019   LDLCALC 101 (H) 01/20/2019   LDLCALC 98 10/20/2018   Lab Results  Component Value Date   TRIG 149.0 05/12/2019   TRIG 131.0 01/20/2019   TRIG 73.0 10/20/2018   Lab Results  Component Value Date   CHOLHDL 4 05/12/2019   CHOLHDL 4 01/20/2019   CHOLHDL 3 10/20/2018   Lab Results  Component Value Date   LDLDIRECT 91.0 07/11/2015    Has history of abnormal liver functions, higher previously but now back to normal, likely this is from fatty liver  Lab Results  Component Value Date   ALT 52 09/07/2019     HYPERTENSION: Controlled only with Jardiance Blood pressure is high normal today but generally has been normal  Renal function normal   BP Readings from Last 3 Encounters:  09/10/19 140/70  07/24/19 122/82  07/15/19 (!) 141/95   Lab Results  Component Value Date   CREATININE 1.03 09/07/2019   CREATININE 0.93 07/15/2019   CREATININE 0.91 05/12/2019   NEUROPATHY:  He has had some burning in the left foot occasionally However he is having discomfort, paresthesia, some numbness and difficulty using his right hand which is spreading to the left also Sometimes  this will be better with dangling his hand   Currently taking gabapentin 2-3 times a day  He is again having issues with foot ulcers, now on his left and has had a toe amputation, followed by orthopedic surgeon   Examination:   BP 140/70 (BP Location: Left Arm, Patient Position: Sitting, Cuff Size: Normal)   Pulse 83   Ht 6' (1.829 m)   Wt 211 lb 6.4 oz (95.9 kg)   SpO2 95%   BMI 28.67 kg/m   Body mass index is 28.67 kg/m.   ASSESSMENT/ PLAN:      Diabetes type 2:  See history of present illness for detailed discussion of current diabetes management, blood sugar patterns and problems identified  A1c is improving at 6.8, was 7.3  He is on a V-go pump along with metformin and Jardiance  Blood sugars are likely better with using NovoLog and with this postprandial readings are likely better than with regular insulin Also likely benefiting from [place] to review his blood sugars in detail since his meter is not functioning New Accu-Chek meter will be prescribed More recently has done better with diet otherwise may get high readings from eating some sweets and high-fat meals or snacks  NEUROPATHY/carpal tunnel syndrome Symptoms in feet better with increasing gabapentin  History of hypertension: Not on specific treatment but systolic reading higher today which is not typical Will need to periodically follow  LIPIDS: LDL was previously below 100 and will need periodic monitoring  Abnormal liver functions: Resolved  Follow-up in 3 months  There are no Patient Instructions on file for this visit.    Elayne Snare 09/10/2019, 3:45 PM   Note: This office note was prepared with Dragon voice recognition system technology. Any transcriptional errors that result from this process are unintentional.

## 2019-09-10 NOTE — Patient Instructions (Signed)
Check blood sugars on waking up 4-5 days a week  Also check blood sugars about 2 hours after meals and do this after different meals by rotation  Recommended blood sugar levels on waking up are 90-130 and about 2 hours after meal is 130-160  Please bring your blood sugar monitor to each visit, thank you  

## 2019-09-22 ENCOUNTER — Encounter: Payer: Self-pay | Admitting: Orthopedic Surgery

## 2019-09-22 ENCOUNTER — Ambulatory Visit (INDEPENDENT_AMBULATORY_CARE_PROVIDER_SITE_OTHER): Payer: Medicare HMO | Admitting: Orthopedic Surgery

## 2019-09-22 ENCOUNTER — Other Ambulatory Visit: Payer: Self-pay

## 2019-09-22 VITALS — Ht 72.0 in | Wt 211.0 lb

## 2019-09-22 DIAGNOSIS — Z89422 Acquired absence of other left toe(s): Secondary | ICD-10-CM

## 2019-09-22 DIAGNOSIS — M869 Osteomyelitis, unspecified: Secondary | ICD-10-CM

## 2019-09-22 NOTE — Progress Notes (Signed)
Office Visit Note   Patient: Gregory May           Date of Birth: 26-Jul-1961           MRN: [id number] Visit Date: 09/22/2019              Requested by: Lanelle Bal, MD [address] [place],  [place] [address] PCP: Lanelle Bal, MD  Chief Complaint  Patient presents with  . Left Foot - Routine Post Op    07/24/19 left 3rd ray amputation       HPI: The patient is a 59 year old gentleman seen today status post left foot third ray amputation on December 11.  He is full weightbearing today.He has been doing dry dressing changes.  Assessment & Plan: Visit Diagnoses:  No diagnosis found.  Plan: He will continue to do daily dressing changes, begin placing prisma dressing in ulcerative area follwing Dial soap cleansing.  Minimize weightbearing.  Follow-Up Instructions: No follow-ups on file.   Ortho Exam  Patient is alert, oriented, no adenopathy, well-dressed, normal affect, normal respiratory effort. The dorsal and plantar aspect of his incision is well-healed however in the webspace there is a 8 mm in diameter open area that has not yet healed there is flat pink tissue in the wound bed. this does not probe to bone or tendon today. Is 4 mm deep. No tunnelling.  No active drainage no swelling of the foot.  Imaging: No results found. No images are attached to the encounter.  Labs: Lab Results  Component Value Date   HGBA1C 6.8 (H) 09/07/2019   HGBA1C 7.3 (H) 05/12/2019   HGBA1C 7.7 (H) 01/20/2019   ESRSEDRATE 8 07/15/2019   ESRSEDRATE 56 (H) 06/01/2015   CRP 48 (H) 07/15/2019   REPTSTATUS 07/29/2019 FINAL 07/24/2019   GRAMSTAIN  07/24/2019    RARE WBC PRESENT, PREDOMINANTLY MONONUCLEAR NO ORGANISMS SEEN    CULT  07/24/2019    RARE STAPHYLOCOCCUS CAPRAE NO ANAEROBES ISOLATED Performed at [facility], [address], [place], [place] [address]    [facility] STAPHYLOCOCCUS CAPRAE 07/24/2019     Lab Results  Component Value Date   ALBUMIN  4.5 09/07/2019   ALBUMIN 4.2 05/12/2019   ALBUMIN 3.9 01/20/2019    No results found for: MG Lab Results  Component Value Date   VD25OH 16 (L) 11/05/2013    No results found for: PREALBUMIN CBC EXTENDED Latest Ref Rng & Units 07/15/2019 06/07/2015 06/07/2015  WBC 3.4 - 10.8 x10E3/uL 11.8(H) 13.2(H) 12.2(H)  RBC 4.14 - 5.80 x10E6/uL 5.23 4.57 4.51  HGB 13.0 - 17.7 g/dL 32.9 51.8 84.1  HCT 66.0 - 51.0 % 47.4 41.7 41.3  PLT 150 - 450 x10E3/uL 383 435(H) 409(H)  NEUTROABS 1.4 - 7.0 x10E3/uL 8.2(H) 8.9(H) -  LYMPHSABS 0.7 - 3.1 x10E3/uL 2.2 2.5 -     Body mass index is 28.62 kg/m.  Orders:  No orders of the defined types were placed in this encounter.  No orders of the defined types were placed in this encounter.    Procedures: No procedures performed  Clinical Data: No additional findings.  ROS:  All other systems negative, except as noted in the HPI. Review of Systems  Constitutional: Negative for chills and fever.  Skin: Positive for wound. Negative for color change.    Objective: Vital Signs: Ht 6' (1.829 m)   Wt 211 lb (95.7 kg)   BMI 28.62 kg/m   Specialty Comments:  No specialty comments available.  PMFS History: Patient Active Problem List   Diagnosis Date Noted  . Cutaneous abscess of left foot   . Diastasis of rectus abdominis 07/16/2019  . 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 Essential hypertension 06/19/2018  . Peripheral neuropathy 12/19/2017  . Non-pressure chronic ulcer of right calf, limited to breakdown of skin ([place]) 04/17/2017  . Acquired absence of right leg below knee ([place]) 04/17/2017  . Carpal tunnel syndrome 03/18/2017  . Colon cancer screening 06/21/2015  . Status post below knee amputation of right lower extremity ([place]) 06/07/2015  . 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 ([place]) 06/01/2015  . Diabetic polyneuropathy associated with  type 2 diabetes mellitus ([place]) 12/09/2014  . Diabetes mellitus with carpal tunnel syndrome ([place]) 12/09/2014  . DJD (degenerative joint disease) of knee 07/21/2014  . Osteoarthritis, hip, bilateral 05/25/2014  . Pulmonary emboli ([place]) 02/08/2014  . DVT (deep venous thrombosis) ([place]) 02/08/2014  . Onychomycosis 10/14/2013  . Pure hypercholesterolemia 09/25/2013  . Diabetic foot ulcer ([place]) 09/15/2013  . Sebaceous cyst 08/16/2011  . Poorly controlled type 2 diabetes mellitus with complication ([place]) 67/61/9509   Past Medical History:  Diagnosis Date  . Arthritis    bilateral hips  . Deep vein thrombosis (DVT) ([place])   . Diabetes mellitus    type II  . 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 DJD (degenerative joint disease)   . Pulmonary emboli ([place]) 02/08/2014   Date of diagnosis February 08 2014, on chest CTA Hospitalized for 3 days Had some symptoms of shortness of breath, and chest pain With intercurrent DVT of the left LE. Duration of anticoagulation: 8 months. End date 10/11/2014.  Anticoagulant: Lovenox 120 units daily Switched to Eliquis on 05/25/2014 per patient preference   . Pulmonary embolism ([place])   . Sebaceous cyst    on back of neck    Family History  Problem Relation Age of Onset  . Cancer Mother        breast, colon, liver  . Diabetes Father     Past Surgical History:  Procedure Laterality Date  . AMPUTATION Right 06/03/2015   Procedure: Right Below Knee Amputation;  Surgeon: [name], MD;  Location: [facility];  Service: Orthopedics;  Laterality: Right;  . AMPUTATION Left 07/24/2019   Procedure: LEFT FOOT 3RD RAY AMPUTATION;  Surgeon: [name], MD;  Location: [facility];  Service: Orthopedics;  Laterality: Left;  . CLOSED REDUCTION WITH HUMER PIN INSERTION  1974   left hip  . HARDWARE REMOVAL Left 07/21/2014   Procedure: HARDWARE REMOVAL;  Surgeon: Ninetta Lights, MD;  Location: [facility];  Service: Orthopedics;  Laterality: Left;  . JOINT REPLACEMENT  2006  (approx)   right hip replaced  . ROTATOR CUFF REPAIR  2005 (approx)   right   . 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 TOTAL HIP ARTHROPLASTY Left 07/21/2014   Procedure: TOTAL HIP ARTHROPLASTY ANTERIOR APPROACH;  Surgeon: Ninetta Lights, MD;  Location: [facility];  Service: Orthopedics;  Laterality: Left;   Social History   Occupational History  . Not on file  Tobacco Use  . Smoking status: Never Smoker  . Smokeless tobacco: Never Used  Substance and Sexual Activity  . Alcohol use: Yes    Alcohol/week: 0.0 standard drinks    Comment: beer and mixed drink maybe 1 - 2 times a month  .  Drug use: No  . Sexual activity: Never

## 2019-09-25 ENCOUNTER — Other Ambulatory Visit: Payer: Self-pay | Admitting: Pharmacy Technician

## 2019-09-25 ENCOUNTER — Other Ambulatory Visit: Payer: Self-pay | Admitting: Pharmacist

## 2019-09-25 NOTE — Patient Outreach (Signed)
[facility]) Care Management  09/25/2019  Gregory May Feb 24, 1961 [id number]     Return call placed to patient regarding voicemail patient left for me, HIPAA compliant voicemail left.   Patient left me a voicemail requesting a call back unfortunately he did not answer the phone when I placed the call back to him.  Follow up:  Will await patient to return call  [name] P. [name], CPhT Musician Care Management [phone]

## 2019-09-25 NOTE — Patient Outreach (Signed)
[facility]) Care Management  09/25/2019  Gregory May 1961-05-04 [id number]   Patient called with questions about his patient assistance forms. HIPAA identifiers were obtained. He was walked through the process and communicated understanding. He said he would mail the forms ASAP.  Plan: Pattricia Boss will follow for medication assistance.  [name], PharmD, BCACP [facility] Clinical Pharmacist [phone]

## 2019-09-28 ENCOUNTER — Ambulatory Visit: Payer: Medicare HMO | Admitting: Pharmacist

## 2019-09-30 ENCOUNTER — Other Ambulatory Visit: Payer: Self-pay | Admitting: Pharmacy Technician

## 2019-09-30 ENCOUNTER — Telehealth: Payer: Self-pay | Admitting: Family

## 2019-09-30 NOTE — Telephone Encounter (Signed)
I called and the pt wanted to know if he should be applying abx ointment to the wound in addition to the prisma dressing he had been given in the office. I advised that this was not needed and that he would apply the prisma to the affected area and then cover as he had been advised in the office. Pt voiced understanding and he will call with any questions and keep appointment next week

## 2019-09-30 NOTE — Patient Outreach (Signed)
[facility]) Care Management  09/30/2019  Gregory May February 12, 1961 [id number]    Received both patient and provider portion(s) of patient assistance application(s) for Jardiance with BI and Novolog with [employer]. Faxed completed application and required documents into [employer] and [employer].  Will follow up with company(ies) in 5-7 business days to check status of application(s).  [name] P. Celines Femia, CPhT Musician Care Management [phone]

## 2019-09-30 NOTE — Patient Outreach (Signed)
[facility]) Care Management  09/30/2019  Gregory May March 07, 1961 [id number]     Successful call placed to patient regarding patient assistance application(s) for [employer] for Jardiance and Novolog for [employer] , HIPAA identifiers verified.   Informed patient that a date was incorrect on his application. Inquired if I had the permission to correct and fix the date. Patient gave consent. Informed patient I would submit applications to [employer] and [employer]  Follow up:  Submitted applications and will follow up accordingly  [name] P. [name], CPhT Musician Care Management [phone]

## 2019-09-30 NOTE — Telephone Encounter (Signed)
Patient called.   He is wanting a call back to verify the directions of his medication.   Call back number: [phone]

## 2019-09-30 NOTE — Patient Outreach (Signed)
[facility]) Care Management  09/30/2019  Napoleon T Ontko [birth year] [id number]   Error

## 2019-10-05 ENCOUNTER — Telehealth: Payer: Self-pay

## 2019-10-05 NOTE — Telephone Encounter (Signed)
Received fax from [employer] patient assistance program stating that the pt has been approved through November 30,2021. A four month supply of medications is being shipped to this office, per the fax.

## 2019-10-06 ENCOUNTER — Other Ambulatory Visit: Payer: Self-pay | Admitting: Pharmacy Technician

## 2019-10-06 ENCOUNTER — Ambulatory Visit (INDEPENDENT_AMBULATORY_CARE_PROVIDER_SITE_OTHER): Payer: Medicare HMO | Admitting: Orthopedic Surgery

## 2019-10-06 ENCOUNTER — Encounter: Payer: Self-pay | Admitting: Orthopedic Surgery

## 2019-10-06 ENCOUNTER — Other Ambulatory Visit: Payer: Self-pay

## 2019-10-06 VITALS — Ht 72.0 in | Wt 211.0 lb

## 2019-10-06 DIAGNOSIS — T8781 Dehiscence of amputation stump: Secondary | ICD-10-CM

## 2019-10-06 DIAGNOSIS — Z89422 Acquired absence of other left toe(s): Secondary | ICD-10-CM

## 2019-10-06 NOTE — Patient Outreach (Signed)
[facility]) Care Management  10/06/2019  Gregory May 05-11-1961 [id number]   Care coordination call placed to Novo Nordisk inr egards to patient's application for Novolog.  Spoke to [place] who informed patient was APPROVED 10/01/2019-07/12/2020. She informed request was placed on 10/01/2019 and medication should arrive at provider's office in the next 10-14 business days. Some delay are occurring due to weather related events.  Will follow up with patient in 14-21 business days to confirm medication was received.  Harris Penton P. Liviya Santini, CPhT Musician Care Management [phone]

## 2019-10-06 NOTE — Progress Notes (Signed)
Post-Op Visit Note   Patient: Gregory May           Date of Birth: Mar 08, 1961           MRN: [id number] Visit Date: 10/06/2019 PCP: Kathi Ludwig, MD  Chief Complaint:  Chief Complaint  Patient presents with  . Left Foot - Routine Post Op    07/24/19 left foot 3rd ray amputation     HPI:  HPI The patient is a 59 year old gentleman who presents today status post left foot third ray amputation unfortunately this did dehisce distally.  He has been doing Prisma dressing changes daily.  After 1 cleansing.  He has no concerns feels the incision is closing.  He is also complaining of a painful callused area to his heel he states he has been applying lotion without any improvement  Ortho Exam On examination of the left foot Vive incision unfortunately dehisced distally this is open about 5 mm in diameter and probes 1 cm deep.  This does not probe to bone. there is scant clear drainage there is surrounding maceration there is no odor no erythema he does have a palpable dorsalis pedis pulse.  the callus and corn was debrided with a 10 blade knife back to viable tissue.  There is no surrounding erythema there is no ulcer.  Patient voiced immediate relief after corn was pared  Visit Diagnoses:  1. H/O amputation of lesser toe, left ([place])   2. Dehiscence of amputation stump ([place])     Plan: He will continue to cleanse his foot and wound daily.  Provided some silver he will pack it open.  He will minimize his weightbearing continue daily foot checks to follow-up in 2 weeks  Follow-Up Instructions: Return in about 2 weeks (around 10/20/2019).   Imaging: No results found.  Orders:  No orders of the defined types were placed in this encounter.  No orders of the defined types were placed in this encounter.    PMFS History: Patient Active Problem List   Diagnosis Date Noted  . Cutaneous abscess of left foot   . Diastasis of rectus abdominis 07/16/2019  . Ear drum  perforation, right 03/06/2019  . 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 Essential hypertension 06/19/2018  . Peripheral neuropathy 12/19/2017  . Non-pressure chronic ulcer of right calf, limited to breakdown of skin ([place]) 04/17/2017  . Acquired absence of right leg below knee ([place]) 04/17/2017  . Carpal tunnel syndrome 03/18/2017  . Colon cancer screening 06/21/2015  . Status post below knee amputation of right lower extremity ([place]) 06/07/2015  . 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 Diabetic polyneuropathy associated with type 2 diabetes mellitus ([place]) 12/09/2014  . Diabetes mellitus with carpal tunnel syndrome ([place]) 12/09/2014  . DJD (degenerative joint disease) of knee 07/21/2014  . Osteoarthritis, hip, bilateral 05/25/2014  . Pulmonary emboli ([place]) 02/08/2014  . DVT (deep venous thrombosis) ([place]) 02/08/2014  . Onychomycosis 10/14/2013  . Pure hypercholesterolemia 09/25/2013  . Diabetic foot ulcer ([place]) 09/15/2013  . Sebaceous cyst 08/16/2011  . Poorly controlled type 2 diabetes mellitus with complication ([place]) 34/74/2595   Past Medical History:  Diagnosis Date  . Arthritis    bilateral hips  . Deep vein thrombosis (DVT) ([place])   . Diabetes mellitus    type II  . 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 DJD (degenerative joint disease)   . Pulmonary emboli ([place])  02/08/2014   Date of diagnosis February 08 2014, on chest CTA Hospitalized for 3 days Had some symptoms of shortness of breath, and chest pain With intercurrent DVT of the left LE. Duration of anticoagulation: 8 months. End date 10/11/2014.  Anticoagulant: Lovenox 120 units daily Switched to Eliquis on 05/25/2014 per patient preference   . Pulmonary embolism (HCC)   . Sebaceous cyst    on back of neck    Family History  Problem Relation Age of Onset  . Cancer Mother        breast, colon, liver  . Diabetes Father     Past Surgical History:   Procedure Laterality Date  . AMPUTATION Right 06/03/2015   Procedure: Right Below Knee Amputation;  Surgeon: Nadara Mustard, MD;  Location: [facility];  Service: Orthopedics;  Laterality: Right;  . AMPUTATION Left 07/24/2019   Procedure: LEFT FOOT 3RD RAY AMPUTATION;  Surgeon: Nadara Mustard, MD;  Location: [facility] OR;  Service: Orthopedics;  Laterality: Left;  . CLOSED REDUCTION WITH HUMER PIN INSERTION  1974   left hip  . HARDWARE REMOVAL Left 07/21/2014   Procedure: HARDWARE REMOVAL;  Surgeon: Loreta Ave, MD;  Location: [facility] OR;  Service: Orthopedics;  Laterality: Left;  . JOINT REPLACEMENT  2006 (approx)   right hip replaced  . ROTATOR CUFF REPAIR  2005 (approx)   right   . 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 TOTAL HIP ARTHROPLASTY Left 07/21/2014   Procedure: TOTAL HIP ARTHROPLASTY ANTERIOR APPROACH;  Surgeon: Loreta Ave, MD;  Location: [facility] OR;  Service: Orthopedics;  Laterality: Left;   Social History   Occupational History  . Not on file  Tobacco Use  . Smoking status: Never Smoker  . Smokeless tobacco: Never Used  Substance and Sexual Activity  . Alcohol use: Yes    Alcohol/week: 0.0 standard drinks    Comment: beer and mixed drink maybe 1 - 2 times a month  . Drug use: No  . Sexual activity: Never

## 2019-10-07 ENCOUNTER — Other Ambulatory Visit: Payer: Self-pay | Admitting: Pharmacy Technician

## 2019-10-07 ENCOUNTER — Telehealth: Payer: Self-pay | Admitting: Family

## 2019-10-07 NOTE — Patient Outreach (Signed)
[facility]) Care Management  10/07/2019  Gregory May [birth year] [id number]  Care coordination call placed to BI in regards to patient's application for Jardiance.  Spoke to [place] who informed patient was APPROVED 10/07/2019-08/12/2020. She informed it can take up to 2 days for the pharmacy to receive and process the order and another 7-10 business days for the medication to ship and arrive at patient's home.  Will follow up with patient in 10-14 business days to confirm receipt of medication.  Gregory May, CPhT Musician Care Management [phone]

## 2019-10-07 NOTE — Telephone Encounter (Signed)
Patient is using Promogran and has enough until he sees you. Denny Peon wanted him to put some silver stuff in between his toes and he does not know name of it.  Please call patient to find out what the name of the silver stuff is.  [phone]

## 2019-10-08 NOTE — Telephone Encounter (Signed)
I called and advised the pt that he was advised to use silvercell. Pt states that he has an old aquacell that he will use after he runs out of prisma. Will call with any questions.

## 2019-10-21 ENCOUNTER — Telehealth: Payer: Self-pay

## 2019-10-21 NOTE — Telephone Encounter (Signed)
Received shipment from [employer] pt assistance foundation with medication for pt. Inside shipment was 8 vials of Novolog insulin. Pt was called and notified of this and that it was available for pick up.

## 2019-10-26 ENCOUNTER — Ambulatory Visit (INDEPENDENT_AMBULATORY_CARE_PROVIDER_SITE_OTHER): Payer: Medicare HMO | Admitting: Physician Assistant

## 2019-10-26 ENCOUNTER — Other Ambulatory Visit: Payer: Self-pay | Admitting: Pharmacy Technician

## 2019-10-26 ENCOUNTER — Telehealth: Payer: Self-pay | Admitting: Pharmacist

## 2019-10-26 ENCOUNTER — Other Ambulatory Visit: Payer: Self-pay

## 2019-10-26 ENCOUNTER — Encounter: Payer: Self-pay | Admitting: Orthopedic Surgery

## 2019-10-26 VITALS — Ht 72.0 in | Wt 211.0 lb

## 2019-10-26 DIAGNOSIS — M869 Osteomyelitis, unspecified: Secondary | ICD-10-CM | POA: Diagnosis not present

## 2019-10-26 DIAGNOSIS — Z89422 Acquired absence of other left toe(s): Secondary | ICD-10-CM | POA: Diagnosis not present

## 2019-10-26 NOTE — Patient Outreach (Addendum)
[facility]) Care Management  10/26/2019  TRACKER MANCE [birth year] [id number]   ADDENDUM   Incoming call from patient regarding patient assistance medication delivery of Jardiance with BI and Novolog with [employer], HIPAA identifiers verified.   Patient informed he received the Jardiance from [facility] in the mail. Discussed refill procedure with patient. Patient was able to locate the phone number to call in his refills on the bottle of Jardiance. Informed patient to call 2-3 weeks before running out of medication. Patient verbalized understanding.  Patient informed he received a call informing him that the Novolog was at the provider's office for him to pick up. Patient informed he was going to pick up this week. Discussed refill procedure with patient which requires patient's provider to phone in or fax request into [employer]. Novo Nordisk does not accept refill requests from patient. Informed patient to outreach provider's office 2-3 weeks before running out of medication to request. Patient verbalized understanding.  Patient informs he has no other questions or concerns and confirmed patient had name and number.  Follow up:  Will route note to [facility] RPh Nunzio Cobbs  for case closure and will remove myself from care team.  Stacie Acres. Azia Toutant, CPhT [facility] [employer]  [phone]

## 2019-10-26 NOTE — Progress Notes (Signed)
Office Visit Note   Patient: GRYPHON VANDERVEEN           Date of Birth: [birth year]           MRN: [id number] Visit Date: 10/26/2019              Requested by: Kathi Ludwig, MD [address] [place],  [place] [address] PCP: Kathi Ludwig, MD  Chief Complaint  Patient presents with  . Left Foot - Routine Post Op    07/24/19 left foot 3rd ray amputation       HPI: This is a pleasant 59 year old gentleman who is 3 months status post left foot third ray amputation.  He has been slow to heal right in the webspace.  He has been doing a dressing change with a parisma and he feels he is much improved.  He said he really does not get much drainage at all Assessment & Plan: Visit Diagnoses: No diagnosis found.  Plan: He will continue with dressing changes this is healing quite well follow-up in 3 weeks  Follow-Up Instructions: No follow-ups on file.   Ortho Exam  Patient is alert, oriented, no adenopathy, well-dressed, normal affect, normal respiratory effort. He has no swelling or erythema or foul odor.  He has a 2 mm area in the webspace that is still open.  There is no foul odor there is no drainage it does not probe deeply but only probes about 1 to 2 mm deep.  No cellulitis he has a strong palpable pulse  Imaging: No results found. No images are attached to the encounter.  Labs: Lab Results  Component Value Date   HGBA1C 6.8 (H) 09/07/2019   HGBA1C 7.3 (H) 05/12/2019   HGBA1C 7.7 (H) 01/20/2019   ESRSEDRATE 8 07/15/2019   ESRSEDRATE 56 (H) 06/01/2015   CRP 48 (H) 07/15/2019   REPTSTATUS 07/29/2019 FINAL 07/24/2019   GRAMSTAIN  07/24/2019    RARE WBC PRESENT, PREDOMINANTLY MONONUCLEAR NO ORGANISMS SEEN    CULT  07/24/2019    RARE STAPHYLOCOCCUS CAPRAE NO ANAEROBES ISOLATED Performed at [facility], [place] [address], [place], [place] [address]    [place] CAPRAE 07/24/2019     Lab Results  Component Value Date   ALBUMIN 4.5  09/07/2019   ALBUMIN 4.2 05/12/2019   ALBUMIN 3.9 01/20/2019    No results found for: MG Lab Results  Component Value Date   VD25OH 16 (L) 11/05/2013    No results found for: PREALBUMIN CBC EXTENDED Latest Ref Rng & Units 07/15/2019 06/07/2015 06/07/2015  WBC 3.4 - 10.8 x10E3/uL 11.8(H) 13.2(H) 12.2(H)  RBC 4.14 - 5.80 x10E6/uL 5.23 4.57 4.51  HGB 13.0 - 17.7 g/dL 16.2 13.9 13.2  HCT 37.5 - 51.0 % 47.4 41.7 41.3  PLT 150 - 450 x10E3/uL 383 435(H) 409(H)  NEUTROABS 1.4 - 7.0 x10E3/uL 8.2(H) 8.9(H) -  LYMPHSABS 0.7 - 3.1 x10E3/uL 2.2 2.5 -     Body mass index is 28.62 kg/m.  Orders:  No orders of the defined types were placed in this encounter.  No orders of the defined types were placed in this encounter.    Procedures: No procedures performed  Clinical Data: No additional findings.  ROS:  All other systems negative, except as noted in the HPI. Review of Systems  Objective: Vital Signs: Ht 6' (1.829 m)   Wt 211 lb (95.7 kg)   BMI 28.62 kg/m   Specialty Comments:  No specialty comments available.  PMFS History: Patient  Active Problem List   Diagnosis Date Noted  . Cutaneous abscess of left foot   . Diastasis of rectus abdominis 07/16/2019  . 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 Essential hypertension 06/19/2018  . Peripheral neuropathy 12/19/2017  . Non-pressure chronic ulcer of right calf, limited to breakdown of skin (HCC) 04/17/2017  . Acquired absence of right leg below knee (HCC) 04/17/2017  . Carpal tunnel syndrome 03/18/2017  . Colon cancer screening 06/21/2015  . Status post below knee amputation of right lower extremity (HCC) 06/07/2015  . 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 Diabetic polyneuropathy associated with type 2 diabetes mellitus (HCC) 12/09/2014  . Diabetes mellitus with carpal tunnel syndrome (HCC) 12/09/2014    . DJD (degenerative joint disease) of knee 07/21/2014  . Osteoarthritis, hip, bilateral 05/25/2014  . Pulmonary emboli (HCC) 02/08/2014  . DVT (deep venous thrombosis) (HCC) 02/08/2014  . Onychomycosis 10/14/2013  . Pure hypercholesterolemia 09/25/2013  . Diabetic foot ulcer (HCC) 09/15/2013  . Sebaceous cyst 08/16/2011  . Poorly controlled type 2 diabetes mellitus with complication (HCC) 09/15/1997   Past Medical History:  Diagnosis Date  . Arthritis    bilateral hips  . Deep vein thrombosis (DVT) (HCC)   . Diabetes mellitus    type II  . 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 DJD (degenerative joint disease)   . Pulmonary emboli (HCC) 02/08/2014   Date of diagnosis February 08 2014, on chest CTA Hospitalized for 3 days Had some symptoms of shortness of breath, and chest pain With intercurrent DVT of the left LE. Duration of anticoagulation: 8 months. End date 10/11/2014.  Anticoagulant: Lovenox 120 units daily Switched to Eliquis on 05/25/2014 per patient preference   . Pulmonary embolism (HCC)   . Sebaceous cyst    on back of neck    Family History  Problem Relation Age of Onset  . Cancer Mother        breast, colon, liver  . Diabetes Father     Past Surgical History:  Procedure Laterality Date  . AMPUTATION Right 06/03/2015   Procedure: Right Below Knee Amputation;  Surgeon: Nadara Mustard, MD;  Location: [facility] OR;  Service: Orthopedics;  Laterality: Right;  . AMPUTATION Left 07/24/2019   Procedure: LEFT FOOT 3RD RAY AMPUTATION;  Surgeon: Nadara Mustard, MD;  Location: [facility] OR;  Service: Orthopedics;  Laterality: Left;  . CLOSED REDUCTION WITH HUMER PIN INSERTION  1974   left hip  . HARDWARE REMOVAL Left 07/21/2014   Procedure: HARDWARE REMOVAL;  Surgeon: Loreta Ave, MD;  Location: [facility] OR;  Service: Orthopedics;  Laterality: Left;  . JOINT REPLACEMENT  2006 (approx)   right hip replaced  . ROTATOR CUFF REPAIR  2005 (approx)   right   . TOTAL HIP ARTHROPLASTY Left  07/21/2014   DR MURPHY  . TOTAL HIP ARTHROPLASTY Left 07/21/2014   Procedure: TOTAL HIP ARTHROPLASTY ANTERIOR APPROACH;  Surgeon: Loreta Ave, MD;  Location: [facility] OR;  Service: Orthopedics;  Laterality: Left;   Social History   Occupational History  . Not on file  Tobacco Use  . Smoking status: Never Smoker  . Smokeless tobacco: Never Used  Substance and Sexual Activity  . Alcohol use: Yes    Alcohol/week: 0.0 standard drinks    Comment: beer and mixed drink maybe 1 - 2 times a month  . Drug use:  No  . Sexual activity: Never

## 2019-10-26 NOTE — Patient Outreach (Signed)
[facility]) Care Management  10/26/2019  Gregory May [birth year] [id number]  Patient's case is being closed as he received Jardiance with BI and Novolog with [employer] (patient assistance programs).  He confirmed understanding of how to reorder with Pattricia Boss, CPhT.  Plan: Close patient's pharmacy case.  [name], PharmD, BCACP [facility] Clinical Pharmacist [phone]

## 2019-10-26 NOTE — Patient Outreach (Signed)
[facility]) Care Management  10/26/2019  DARRYLL RAJU [birth year] [id number]    Successful call placed to patient regarding patient assistance medication delivery of Jardiance with Bi and Novolog with [employer], HIPAA compliant voicemail left.   Unfortunately patient did not answer phone to confirm receipt of Jardiance.   Per Anette Riedel Mincy,LPN at [facility], 8 vials of Novolog insulin was received at the office on 10/21/2019 and patient was called and notified.  Follow up:  Will outreach patient in 3-5 business days if call is not returned.  Courtnay Petrilla P. Lorrane Mccay, CPhT [facility] [employer]  [phone]

## 2019-11-16 ENCOUNTER — Ambulatory Visit (INDEPENDENT_AMBULATORY_CARE_PROVIDER_SITE_OTHER): Payer: Medicare HMO | Admitting: Orthopedic Surgery

## 2019-11-16 ENCOUNTER — Encounter: Payer: Self-pay | Admitting: Orthopedic Surgery

## 2019-11-16 ENCOUNTER — Other Ambulatory Visit: Payer: Self-pay

## 2019-11-16 DIAGNOSIS — Z89511 Acquired absence of right leg below knee: Secondary | ICD-10-CM | POA: Diagnosis not present

## 2019-11-16 DIAGNOSIS — B351 Tinea unguium: Secondary | ICD-10-CM | POA: Diagnosis not present

## 2019-11-16 DIAGNOSIS — S88111A Complete traumatic amputation at level between knee and ankle, right lower leg, initial encounter: Secondary | ICD-10-CM

## 2019-11-17 ENCOUNTER — Encounter: Payer: Self-pay | Admitting: Orthopedic Surgery

## 2019-11-17 NOTE — Progress Notes (Signed)
Office Visit Note   Patient: Gregory May           Date of Birth: [birth year]           MRN: [id number] Visit Date: 11/16/2019              Requested by: Kathi Ludwig, MD [address] [place],  [place] [address] PCP: Kathi Ludwig, MD  Chief Complaint  Patient presents with  . Left Foot - Pain      HPI: Patient is a 59 year old gentleman who presents for 3 separate issues #1 end bearing pressure from his right transtibial amputation with a broken down liner.  #2 acute pain plantar aspect of the left foot from walking.  #3 onychomycotic nails x4.  Assessment & Plan: Visit Diagnoses:  1. Below-knee amputation of right lower extremity ([place])   2. Onychomycosis     Plan: Nails were trimmed x4 without problems patient was given a prescription for a new socket liner materials and supplies for his right transtibial amputation.  No intervention for the left foot.  Follow-Up Instructions: Return in about 3 months (around 02/15/2020).   Ortho Exam  Patient is alert, oriented, no adenopathy, well-dressed, normal affect, normal respiratory effort. Examination of the left foot the third ray amputation is well-healed he has some mild venous stasis swelling the Achilles tendon is intact the plantar fascia is intact no signs or symptoms of a tendon disruption or ulceration.  There is no cellulitis.  Examination the right transtibial amputation patient does have a small hyper keratotic lesion over the lateral aspect of the residual limb possibly from an ingrown hair follicle.  There is no end bearing ulcers or calluses and his liner is torn.  There is no redness no cellulitis no signs of infection there is significant loss of volume in the residual limb causing him to subside within the socket.  Imaging: No results found. No images are attached to the encounter.  Labs: Lab Results  Component Value Date   HGBA1C 6.8 (H) 09/07/2019   HGBA1C 7.3 (H) 05/12/2019   HGBA1C 7.7 (H)  01/20/2019   ESRSEDRATE 8 07/15/2019   ESRSEDRATE 56 (H) 06/01/2015   CRP 48 (H) 07/15/2019   REPTSTATUS 07/29/2019 FINAL 07/24/2019   GRAMSTAIN  07/24/2019    RARE WBC PRESENT, PREDOMINANTLY MONONUCLEAR NO ORGANISMS SEEN    CULT  07/24/2019    RARE STAPHYLOCOCCUS CAPRAE NO ANAEROBES ISOLATED Performed at [facility], [place] [address] [place], [place] [address]    [place] CAPRAE 07/24/2019     Lab Results  Component Value Date   ALBUMIN 4.5 09/07/2019   ALBUMIN 4.2 05/12/2019   ALBUMIN 3.9 01/20/2019    No results found for: MG Lab Results  Component Value Date   VD25OH 16 (L) 11/05/2013    No results found for: PREALBUMIN CBC EXTENDED Latest Ref Rng & Units 07/15/2019 06/07/2015 06/07/2015  WBC 3.4 - 10.8 x10E3/uL 11.8(H) 13.2(H) 12.2(H)  RBC 4.14 - 5.80 x10E6/uL 5.23 4.57 4.51  HGB 13.0 - 17.7 g/dL 16.2 13.9 13.2  HCT 37.5 - 51.0 % 47.4 41.7 41.3  PLT 150 - 450 x10E3/uL 383 435(H) 409(H)  NEUTROABS 1.4 - 7.0 x10E3/uL 8.2(H) 8.9(H) -  LYMPHSABS 0.7 - 3.1 x10E3/uL 2.2 2.5 -     There is no height or weight on file to calculate BMI.  Orders:  No orders of the defined types were placed in this encounter.  No orders of the defined types  were placed in this encounter.    Procedures: No procedures performed  Clinical Data: No additional findings.  ROS:  All other systems negative, except as noted in the HPI. Review of Systems  Objective: Vital Signs: There were no vitals taken for this visit.  Specialty Comments:  No specialty comments available.  PMFS History: Patient Active Problem List   Diagnosis Date Noted  . Cutaneous abscess of left foot   . Diastasis of rectus abdominis 07/16/2019  . 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 Essential hypertension 06/19/2018  . Peripheral neuropathy 12/19/2017  . Non-pressure chronic ulcer  of right calf, limited to breakdown of skin (HCC) 04/17/2017  . Acquired absence of right leg below knee (HCC) 04/17/2017  . Carpal tunnel syndrome 03/18/2017  . Colon cancer screening 06/21/2015  . Status post below knee amputation of right lower extremity (HCC) 06/07/2015  . 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 Diabetic polyneuropathy associated with type 2 diabetes mellitus (HCC) 12/09/2014  . Diabetes mellitus with carpal tunnel syndrome (HCC) 12/09/2014  . DJD (degenerative joint disease) of knee 07/21/2014  . Osteoarthritis, hip, bilateral 05/25/2014  . Pulmonary emboli (HCC) 02/08/2014  . DVT (deep venous thrombosis) (HCC) 02/08/2014  . Onychomycosis 10/14/2013  . Pure hypercholesterolemia 09/25/2013  . Diabetic foot ulcer (HCC) 09/15/2013  . Sebaceous cyst 08/16/2011  . Poorly controlled type 2 diabetes mellitus with complication (HCC) 09/15/1997   Past Medical History:  Diagnosis Date  . Arthritis    bilateral hips  . Deep vein thrombosis (DVT) (HCC)   . Diabetes mellitus    type II  . 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 DJD (degenerative joint disease)   . Pulmonary emboli (HCC) 02/08/2014   Date of diagnosis February 08 2014, on chest CTA Hospitalized for 3 days Had some symptoms of shortness of breath, and chest pain With intercurrent DVT of the left LE. Duration of anticoagulation: 8 months. End date 10/11/2014.  Anticoagulant: Lovenox 120 units daily Switched to Eliquis on 05/25/2014 per patient preference   . Pulmonary embolism (HCC)   . Sebaceous cyst    on back of neck    Family History  Problem Relation Age of Onset  . Cancer Mother        breast, colon, liver  . Diabetes Father     Past Surgical History:  Procedure Laterality Date  . AMPUTATION Right 06/03/2015   Procedure: Right Below Knee Amputation;  Surgeon: Nadara Mustard, MD;  [facility] OR;  Service: Orthopedics;  Laterality: Right;  . AMPUTATION Left 07/24/2019    Procedure: LEFT FOOT 3RD RAY AMPUTATION;  Surgeon: Nadara Mustard, MD;  Location[facility] OR;  Service: Orthopedics;  Laterality: Left;  . CLOSED REDUCTION WITH HUMER PIN INSERTION  1974   left hip  . HARDWARE REMOVAL Left 07/21/2014   Procedure: HARDWARE REMOVAL;  Surgeon: Loreta Ave, MD;  Location: [facility] OR;  Service: Orthopedics;  Laterality: Left;  . JOINT REPLACEMENT  2006 (approx)   right hip replaced  . ROTATOR CUFF REPAIR  2005 (approx)   right   . 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 TOTAL HIP ARTHROPLASTY Left 07/21/2014   Procedure: TOTAL HIP ARTHROPLASTY ANTERIOR APPROACH;  Surgeon: Loreta Ave, MD;  Location: [facility] OR;  Service: Orthopedics;  Laterality: Left;   Social History   Occupational  History  . Not on file  Tobacco Use  . Smoking status: Never Smoker  . Smokeless tobacco: Never Used  Substance and Sexual Activity  . Alcohol use: Yes    Alcohol/week: 0.0 standard drinks    Comment: beer and mixed drink maybe 1 - 2 times a month  . Drug use: No  . Sexual activity: Never

## 2019-11-18 ENCOUNTER — Ambulatory Visit: Payer: Medicare HMO | Admitting: Pharmacist

## 2019-12-03 ENCOUNTER — Other Ambulatory Visit (INDEPENDENT_AMBULATORY_CARE_PROVIDER_SITE_OTHER): Payer: Medicare HMO

## 2019-12-03 ENCOUNTER — Other Ambulatory Visit: Payer: Self-pay

## 2019-12-03 DIAGNOSIS — Z794 Long term (current) use of insulin: Secondary | ICD-10-CM | POA: Diagnosis not present

## 2019-12-03 DIAGNOSIS — E1165 Type 2 diabetes mellitus with hyperglycemia: Secondary | ICD-10-CM

## 2019-12-03 LAB — COMPREHENSIVE METABOLIC PANEL
ALT: 84 U/L — ABNORMAL HIGH (ref 0–53)
AST: 63 U/L — ABNORMAL HIGH (ref 0–37)
Albumin: 4.4 g/dL (ref 3.5–5.2)
Alkaline Phosphatase: 52 U/L (ref 39–117)
BUN: 14 mg/dL (ref 6–23)
CO2: 31 mEq/L (ref 19–32)
Calcium: 9.5 mg/dL (ref 8.4–10.5)
Chloride: 100 mEq/L (ref 96–112)
Creatinine, Ser: 0.95 mg/dL (ref 0.40–1.50)
GFR: 81.67 mL/min (ref >60.00)
Glucose, Bld: 120 mg/dL — ABNORMAL HIGH (ref 70–99)
Potassium: 4.4 mEq/L (ref 3.5–5.1)
Sodium: 137 mEq/L (ref 135–145)
Total Bilirubin: 0.3 mg/dL (ref 0.2–1.2)
Total Protein: 7.6 g/dL (ref 6.0–8.3)

## 2019-12-03 LAB — MICROALBUMIN / CREATININE URINE RATIO
Creatinine,U: 84.5 mg/dL
Microalb Creat Ratio: 5.9 mg/g (ref 0.0–30.0)
Microalb, Ur: 5 mg/dL — ABNORMAL HIGH (ref 0.0–1.9)

## 2019-12-03 LAB — LIPID PANEL
Cholesterol: 174 mg/dL (ref 0–200)
HDL: 49.1 mg/dL (ref >39.00)
LDL Cholesterol: 106 mg/dL — ABNORMAL HIGH (ref 0–99)
NonHDL: 124.47
Total CHOL/HDL Ratio: 4
Triglycerides: 90 mg/dL (ref 0.0–149.0)
VLDL: 18 mg/dL (ref 0.0–40.0)

## 2019-12-03 LAB — HEMOGLOBIN A1C: Hgb A1c MFr Bld: 7.2 % — ABNORMAL HIGH (ref 4.6–6.5)

## 2019-12-07 ENCOUNTER — Ambulatory Visit: Payer: Medicare HMO | Admitting: Endocrinology

## 2019-12-09 ENCOUNTER — Other Ambulatory Visit: Payer: Self-pay

## 2019-12-09 ENCOUNTER — Other Ambulatory Visit: Payer: Self-pay | Admitting: Internal Medicine

## 2019-12-09 ENCOUNTER — Other Ambulatory Visit: Payer: Self-pay | Admitting: Endocrinology

## 2019-12-09 DIAGNOSIS — E1149 Type 2 diabetes mellitus with other diabetic neurological complication: Secondary | ICD-10-CM

## 2019-12-10 ENCOUNTER — Encounter: Payer: Self-pay | Admitting: Endocrinology

## 2019-12-10 ENCOUNTER — Ambulatory Visit (INDEPENDENT_AMBULATORY_CARE_PROVIDER_SITE_OTHER): Payer: Medicare HMO | Admitting: Endocrinology

## 2019-12-10 VITALS — BP 122/80 | HR 84 | Ht 72.0 in | Wt 202.6 lb

## 2019-12-10 DIAGNOSIS — L97509 Non-pressure chronic ulcer of other part of unspecified foot with unspecified severity: Secondary | ICD-10-CM | POA: Diagnosis not present

## 2019-12-10 DIAGNOSIS — Z794 Long term (current) use of insulin: Secondary | ICD-10-CM | POA: Diagnosis not present

## 2019-12-10 DIAGNOSIS — E1165 Type 2 diabetes mellitus with hyperglycemia: Secondary | ICD-10-CM | POA: Diagnosis not present

## 2019-12-10 DIAGNOSIS — R748 Abnormal levels of other serum enzymes: Secondary | ICD-10-CM | POA: Diagnosis not present

## 2019-12-10 DIAGNOSIS — E7800 Pure hypercholesterolemia, unspecified: Secondary | ICD-10-CM | POA: Diagnosis not present

## 2019-12-10 LAB — GLUCOSE, POCT (MANUAL RESULT ENTRY): POC Glucose: 154 mg/dl — AB (ref 70–99)

## 2019-12-10 NOTE — Patient Instructions (Signed)
Check blood sugars on waking up 5 days a week  Also check blood sugars about 2 hours after meals and do this after different meals by rotation  Recommended blood sugar levels on waking up are 90-130 and about 2 hours after meal is 130-180  Please bring your blood sugar monitor to each visit, thank you  Hold off Pravastastin  jardiance 20mg  daily

## 2019-12-10 NOTE — Progress Notes (Signed)
Gregory May 59 y.o.           Reason for Appointment: follow-up   History of Present Illness   Diagnosis: Type 2 DIABETES MELITUS, date of diagnosis:  1999     Previous history: He has previously been treated with metformin and Victoza and subsequently mealtime insulin added to control postprandial hyperglycemia Overall he has been  relatively noncompliant with his diet, medications, monitoring and followup Previously would not take Victoza regularly because of cost. Has not been taking any medications for the last year and a half because of commitments to family and cost A1c had increased to 12.3% and hyperglycemia discovered when he was hospitalized for foot ulcer. Also lost 20 pounds because of hyperglycemia   For better control, compliance and inconvenience he was switched to the V-go pump on 02/21/16  Recent history:   Insulin regimen: V-go 20 unit basal with Novolog insulin, mealtime boluses 12-16 breakfast --- 12 -20 units before dinner  Non-insulin hypoglycemic drugs: Metformin ER 1500 mg daily    Jardiance 10 mg daily    A1c  is 7.2 compared to 6.8     Management, blood sugar patterns and problems identified.  He did not bring his blood sugar monitor again  He thinks he has high readings at times especially after meals  This is mostly from eating more carbohydrates and snacks including some sweets at night  He thinks he increases the boluses at dinnertime for larger meals but sometimes forgets and also not always bolusing before eating snacks  Also fasting blood sugars are reportedly higher although not clear how often he checks readings on waking up  Nonfasting afternoon blood sugar was 120 in the lab and today 154  Originally when running out of boluses he will change his V-go pump earlier than 24 hours  No recent hypoglycemia  Changing pump at the same time in am otherwise  Renal function stable with Jardiance       Side effects from medications:  None       Glucometer: One Touch.           Blood Glucose readings by recall:    PRE-MEAL Fasting Lunch Dinner Bedtime Overall  Glucose range: 135-140      Mean/median:        POST-MEAL PC Breakfast PC Lunch PC Dinner  Glucose range:   150-240  Mean/median:      PREVIOUS readings: FASTING range 88-140 After dinner mostly 150-160, highest 179  Meals:  usually 2 meals per day usually.  breakfast at 12 noon, evening meal 7-9 pm, variable snacks late at night    Physical activity: exercise: Minimal               Weight control:   Wt Readings from Last 3 Encounters:  12/10/19 202 lb 9.6 oz (91.9 kg)  10/26/19 211 lb (95.7 kg)  10/06/19 211 lb (95.7 kg)          Diabetes labs:  Lab Results  Component Value Date   HGBA1C 7.2 (H) 12/03/2019   HGBA1C 6.8 (H) 09/07/2019   HGBA1C 7.3 (H) 05/12/2019   Lab Results  Component Value Date   MICROALBUR 5.0 (H) 12/03/2019   LDLCALC 106 (H) 12/03/2019   CREATININE 0.95 12/03/2019    Lab Results  Component Value Date   FRUCTOSAMINE 272 03/03/2019   FRUCTOSAMINE 243 03/07/2017   FRUCTOSAMINE 274 10/30/2016     Other active problems: See review of systems    Office  Visit on 12/10/2019  Component Date Value Ref Range Status  . POC Glucose 12/10/2019 154* 70 - 99 mg/dl Final     Allergies as of 12/10/2019   No Known Allergies     Medication List       Accurate as of December 10, 2019  3:59 PM. If you have any questions, ask your nurse or doctor.        Accu-Chek FastClix Lancets Misc Use Accu Chek Fastclix lancets to check blood sugar three times daily. DX:E11.65   Accu-Chek Guide Me w/Device Kit 1 each by Does not apply route daily.   Accu-Chek Guide test strip Generic drug: glucose blood Use as instructed to check blood sugar three times daily. DX:E11.65   acetaminophen 325 MG tablet Commonly known as: TYLENOL Take 650 mg by mouth every 6 (six) hours as needed (pain).   doxycycline 100 MG tablet Commonly  known as: VIBRA-TABS Take 1 tablet (100 mg total) by mouth 2 (two) times daily.   gabapentin 300 MG capsule Commonly known as: NEURONTIN Take 300 mg by mouth 4 (four) times daily as needed. What changed: Another medication with the same name was removed. Continue taking this medication, and follow the directions you see here. Changed by: Elayne Snare, MD   insulin aspart 100 UNIT/ML injection Commonly known as: novoLOG Max of 56 units daily in V-Go pump   Jardiance 10 MG Tabs tablet Generic drug: empagliflozin Take 10 mg by mouth daily.   metFORMIN 750 MG 24 hr tablet Commonly known as: GLUCOPHAGE-XR Take 2 tablets by mouth once daily   oxyCODONE-acetaminophen 5-325 MG tablet Commonly known as: Percocet Take 1 tablet by mouth every 4 (four) hours as needed for severe pain.   pravastatin 40 MG tablet Commonly known as: PRAVACHOL Take 40 mg by mouth daily. Take 1 tablet by mouth once daily.   pravastatin 80 MG tablet Commonly known as: PRAVACHOL Take 1 tablet by mouth once daily   V-Go 20 Kit APPLY V-GO 20 PUMP TO ARM ONE TIME DAILY AS DIRECTED       Allergies: No Known Allergies  Past Medical History:  Diagnosis Date  . Arthritis    bilateral hips  . Deep vein thrombosis (DVT) ([place])   . Diabetes mellitus    type II  . 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 DJD (degenerative joint disease)   . Pulmonary emboli ([place]) 02/08/2014   Date of diagnosis February 08 2014, on chest CTA Hospitalized for 3 days Had some symptoms of shortness of breath, and chest pain With intercurrent DVT of the left LE. Duration of anticoagulation: 8 months. End date 10/11/2014.  Anticoagulant: Lovenox 120 units daily Switched to Eliquis on 05/25/2014 per patient preference   . Pulmonary embolism ([place])   . Sebaceous cyst    on back of neck    Past Surgical History:  Procedure Laterality Date  . AMPUTATION Right 06/03/2015   Procedure: Right Below Knee Amputation;  Surgeon: [name], MD;  Location: [place];  Service: Orthopedics;  Laterality: Right;  . AMPUTATION Left 07/24/2019   Procedure: LEFT FOOT 3RD RAY AMPUTATION;  Surgeon: [name], MD;  Location: [place];  Service: Orthopedics;  Laterality: Left;  . CLOSED REDUCTION WITH HUMER PIN INSERTION  1974   left hip  . HARDWARE REMOVAL Left 07/21/2014   Procedure: HARDWARE REMOVAL;  Surgeon: Ninetta Lights, MD;  Location: [place];  Service: Orthopedics;  Laterality: Left;  .  JOINT REPLACEMENT  2006 (approx)   right hip replaced  . ROTATOR CUFF REPAIR  2005 (approx)   right   . 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 TOTAL HIP ARTHROPLASTY Left 07/21/2014   Procedure: TOTAL HIP ARTHROPLASTY ANTERIOR APPROACH;  Surgeon: Ninetta Lights, MD;  Location: [place];  Service: Orthopedics;  Laterality: Left;    Family History  Problem Relation Age of Onset  . Cancer Mother        breast, colon, liver  . Diabetes Father     Social History:  reports that he has never smoked. He has never used smokeless tobacco. He reports current alcohol use. He reports that he does not use drugs.  Review of Systems:   Lipids: He is on Pravachol  with somewhat variable control He misunderstood his directions and is only taking half of the 80 mg pravastatin instead of the full tablet  LDL is higher but also is getting some high-fat snacks or sweets at times   Lab Results  Component Value Date   CHOL 174 12/03/2019   CHOL 164 05/12/2019   CHOL 170 01/20/2019   Lab Results  Component Value Date   HDL 49.10 12/03/2019   HDL 44.00 05/12/2019   HDL 41.90 01/20/2019   Lab Results  Component Value Date   LDLCALC 106 (H) 12/03/2019   LDLCALC 90 05/12/2019   LDLCALC 101 (H) 01/20/2019   Lab Results  Component Value Date   TRIG 90.0 12/03/2019   TRIG 149.0 05/12/2019   TRIG 131.0 01/20/2019   Lab Results  Component Value Date   CHOLHDL 4 12/03/2019   CHOLHDL 4 05/12/2019   CHOLHDL 4 01/20/2019   Lab  Results  Component Value Date   LDLDIRECT 91.0 07/11/2015    Has history of abnormal liver functions, these appear to be variable now Not clear if this is from fatty liver Recently blood sugar control not significantly abnormal No history of alcohol excess Has not discussed with PCP  Lab Results  Component Value Date   ALT 84 (H) 12/03/2019   ALT 52 09/07/2019   ALT 51 05/12/2019   ALT 45 03/05/2019   ALT 65 (H) 01/20/2019     HYPERTENSION: This has been mild Controlled with Jardiance Blood pressure is high normal occasionally  Renal function normal Microalbumin normal as of 4/21   BP Readings from Last 3 Encounters:  12/10/19 122/80  09/10/19 140/70  07/24/19 122/82   Lab Results  Component Value Date   CREATININE 0.95 12/03/2019   CREATININE 1.03 09/07/2019   CREATININE 0.93 07/15/2019   NEUROPATHY:  He has had some burning in the left foot occasionally However he is having discomfort, paresthesia, some numbness and difficulty using his right hand which is spreading to the left also Sometimes this will be better with dangling his hand   Currently taking gabapentin 2-3 times a day  He is again having issues with foot ulcers, now on his left and has had a toe amputation, followed by orthopedic surgeon   Examination:   BP 122/80 (BP Location: Left Arm, Patient Position: Sitting, Cuff Size: Normal)   Pulse 84   Ht 6' (1.829 m)   Wt 202 lb 9.6 oz (91.9 kg)   SpO2 96%   BMI 27.48 kg/m   Body mass index is 27.48 kg/m.   ASSESSMENT/ PLAN:     Diabetes type 2:  See history of present illness for detailed discussion of current diabetes management,  blood sugar patterns and problems identified  A1c is back up to 7.2 compared to 6.8  He is on a V-go pump along with metformin and Jardiance   Again not able to review his blood sugars since he did not bring his meter  He is likely having postprandial hyperglycemia but also reportedly has higher fasting  readings  He may benefit from a higher dose of Jardiance for better control; will go up to 20 mg currently and will have him get the prescription for 25 mg  Also he thinks he can do better with the diet again with cutting back on carbohydrates and sweets  Discussed adding extra boluses for increased carbohydrate intake both at mealtimes and snacks which she has not consistently done  May also consider adding NovoLog with an injection to supplement mealtime boluses with the pump  LIPIDS: LDL is over 100 and likely needs 80 mg of pravastatin instead of 40  Abnormal liver functions: Liver functions are worse again and etiology is unclear He needs to be followed up by his PCP for further evaluation In the meantime we will have him hold his pravastatin although this is unlikely to be the cause  Follow-up in 3 months  There are no Patient Instructions on file for this visit.    Elayne Snare 12/10/2019, 3:59 PM   Note: This office note was prepared with Dragon voice recognition system technology. Any transcriptional errors that result from this process are unintentional.

## 2019-12-14 ENCOUNTER — Encounter: Payer: Self-pay | Admitting: *Deleted

## 2019-12-15 ENCOUNTER — Other Ambulatory Visit: Payer: Self-pay

## 2019-12-15 MED ORDER — GABAPENTIN 300 MG PO CAPS: 300.0000 mg | ORAL_CAPSULE | Freq: Four times a day (QID) | ORAL | 0 refills | Status: DC | PRN

## 2019-12-18 ENCOUNTER — Telehealth: Payer: Self-pay | Admitting: Endocrinology

## 2019-12-18 NOTE — Telephone Encounter (Signed)
Patient requests to be called at ph# [phone] re: Application for Jardiance and patient's blood sugars are running in the low 100's (e.g. 112). This morning bs 121.

## 2019-12-21 NOTE — Telephone Encounter (Signed)
ATC pt to get blood sugars from pt, requested call back

## 2019-12-21 NOTE — Telephone Encounter (Signed)
Patient called following up on his call from last week.

## 2019-12-23 NOTE — Telephone Encounter (Signed)
Blood sugars 5/11 6:51 pm 178 5/10 8:30 am 130 5/9 10:41 pm 117 5/9 1:01 pm 96 5/9 9:00 am 135

## 2019-12-23 NOTE — Telephone Encounter (Signed)
Called pt and gave him MD message. He does have a new copy of the application that he needs to fill out in order to receive the 25mg  tablets of Jardiance. Pt stated that he has not been taking two 10mg  tabs because he didn't want to run out before he could get the 25mg  tabs by mail.

## 2019-12-23 NOTE — Telephone Encounter (Signed)
Need to confirm that he is taking 2 pills of the 10 mg Jardiance and he is going to be getting the 25 mg tablets from her assistance program.  Blood sugars are looking okay, needs to check more readings after meals

## 2020-01-07 ENCOUNTER — Other Ambulatory Visit: Payer: Self-pay

## 2020-01-07 MED ORDER — EMPAGLIFLOZIN 25 MG PO TABS: 25.0000 mg | ORAL_TABLET | Freq: Every day | ORAL | 3 refills | Status: DC

## 2020-01-08 ENCOUNTER — Telehealth: Payer: Self-pay

## 2020-01-08 NOTE — Telephone Encounter (Signed)
FAXED DOCUMENTS  Company: [employer] Patient Assistance Program  Document: Prescription for [employer] Other records requested: none at this time.  All above requested information has been faxed successfully to [employer] listed above. Documents and fax confirmation have been placed in the faxed file for future reference.

## 2020-02-16 ENCOUNTER — Ambulatory Visit (INDEPENDENT_AMBULATORY_CARE_PROVIDER_SITE_OTHER): Payer: Medicare HMO | Admitting: Orthopedic Surgery

## 2020-02-16 ENCOUNTER — Other Ambulatory Visit: Payer: Self-pay

## 2020-02-16 ENCOUNTER — Encounter: Payer: Self-pay | Admitting: Orthopedic Surgery

## 2020-02-16 DIAGNOSIS — B351 Tinea unguium: Secondary | ICD-10-CM | POA: Diagnosis not present

## 2020-02-16 DIAGNOSIS — Z89511 Acquired absence of right leg below knee: Secondary | ICD-10-CM | POA: Diagnosis not present

## 2020-02-16 DIAGNOSIS — Z89422 Acquired absence of other left toe(s): Secondary | ICD-10-CM | POA: Diagnosis not present

## 2020-02-16 DIAGNOSIS — S88111A Complete traumatic amputation at level between knee and ankle, right lower leg, initial encounter: Secondary | ICD-10-CM

## 2020-02-16 DIAGNOSIS — L97509 Non-pressure chronic ulcer of other part of unspecified foot with unspecified severity: Secondary | ICD-10-CM | POA: Diagnosis not present

## 2020-02-16 NOTE — Progress Notes (Signed)
Office Visit Note   Patient: Gregory May           Date of Birth: February 13, 1961           MRN: [id number] Visit Date: 02/16/2020              Requested by: Lanelle Bal, MD No address on file PCP: Alphonzo Severance, MD  No chief complaint on file.     HPI: Patient is a 59 year old gentleman who presents today in follow up for several issues.   #1 end bearing pressure from his right transtibial amputation with a broken down liner. No new ulcers. Does have callus over fibular head. Is currently awaiting appointment with prosthetists for new supplies.   #2 onychomycotic nails x4.  #3 foot check   Assessment & Plan: Visit Diagnoses:  1. Below-knee amputation of right lower extremity (HCC)   2. Onychomycosis   3. H/O amputation of lesser toe, left (HCC)     Plan: Nails were trimmed x4 without problems. Patient will continue with prosthesis set up for supplies. No intervention for calluses.  Follow-Up Instructions: Return in about 3 months (around 05/18/2020).   Ortho Exam  Patient is alert, oriented, no adenopathy, well-dressed, normal affect, normal respiratory effort. Examination of the left foot the third ray amputation is well-healed he has some mild venous stasis swelling the Achilles tendon is intact the plantar fascia is intact no signs or symptoms of a tendon disruption or ulceration. Thickened and discolored onychomycotic nails x 4 left foot.  There is no cellulitis.  Examination the right transtibial amputation patient does have a 2 cm in diameter callused ulceration over the lateral aspect of the residual limb over fibular head. There is no end bearing ulcers or calluses.  There is no redness no cellulitis no signs of infection.   Imaging: No results found. No images are attached to the encounter.  Labs: Lab Results  Component Value Date   HGBA1C 7.2 (H) 12/03/2019   HGBA1C 6.8 (H) 09/07/2019   HGBA1C 7.3 (H) 05/12/2019   ESRSEDRATE 8 07/15/2019    ESRSEDRATE 56 (H) 06/01/2015   CRP 48 (H) 07/15/2019   REPTSTATUS 07/29/2019 FINAL 07/24/2019   GRAMSTAIN  07/24/2019    RARE WBC PRESENT, PREDOMINANTLY MONONUCLEAR NO ORGANISMS SEEN    CULT  07/24/2019    RARE STAPHYLOCOCCUS CAPRAE NO ANAEROBES ISOLATED Performed at [facility], [address], [place], [place] [address]    [facility] STAPHYLOCOCCUS CAPRAE 07/24/2019     Lab Results  Component Value Date   ALBUMIN 4.4 12/03/2019   ALBUMIN 4.5 09/07/2019   ALBUMIN 4.2 05/12/2019    No results found for: MG Lab Results  Component Value Date   VD25OH 16 (L) 11/05/2013    No results found for: PREALBUMIN CBC EXTENDED Latest Ref Rng & Units 07/15/2019 06/07/2015 06/07/2015  WBC 3.4 - 10.8 x10E3/uL 11.8(H) 13.2(H) 12.2(H)  RBC 4.14 - 5.80 x10E6/uL 5.23 4.57 4.51  HGB 13.0 - 17.7 g/dL 29.9 37.1 69.6  HCT 78.9 - 51.0 % 47.4 41.7 41.3  PLT 150 - 450 x10E3/uL 383 435(H) 409(H)  NEUTROABS 1 - 7 x10E3/uL 8.2(H) 8.9(H) -  LYMPHSABS 0 - 3 x10E3/uL 2.2 2.5 -     There is no height or weight on file to calculate BMI.  Orders:  No orders of the defined types were placed in this encounter.  No orders of the defined types were placed in this encounter.    Procedures: No procedures  performed  Clinical Data: No additional findings.  ROS:  All other systems negative, except as noted in the HPI. Review of Systems  Constitutional: Negative for chills and fever.  Cardiovascular: Negative for leg swelling.  Skin: Negative for color change and wound.    Objective: Vital Signs: There were no vitals taken for this visit.  Specialty Comments:  No specialty comments available.  PMFS History: Patient Active Problem List   Diagnosis Date Noted  . Cutaneous abscess of left foot   . Diastasis of rectus abdominis 07/16/2019  . 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 Essential  hypertension 06/19/2018  . Peripheral neuropathy 12/19/2017  . Non-pressure chronic ulcer of right calf, limited to breakdown of skin (HCC) 04/17/2017  . Acquired absence of right leg below knee (HCC) 04/17/2017  . Carpal tunnel syndrome 03/18/2017  . Colon cancer screening 06/21/2015  . Status post below knee amputation of right lower extremity (HCC) 06/07/2015  . 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 Diabetic polyneuropathy associated with type 2 diabetes mellitus (HCC) 12/09/2014  . Diabetes mellitus with carpal tunnel syndrome (HCC) 12/09/2014  . DJD (degenerative joint disease) of knee 07/21/2014  . Osteoarthritis, hip, bilateral 05/25/2014  . Pulmonary emboli (HCC) 02/08/2014  . DVT (deep venous thrombosis) (HCC) 02/08/2014  . Onychomycosis 10/14/2013  . Pure hypercholesterolemia 09/25/2013  . Diabetic foot ulcer (HCC) 09/15/2013  . Sebaceous cyst 08/16/2011  . Poorly controlled type 2 diabetes mellitus with complication (HCC) 09/15/1997   Past Medical History:  Diagnosis Date  . Arthritis    bilateral hips  . Deep vein thrombosis (DVT) (HCC)   . Diabetes mellitus    type II  . 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 DJD (degenerative joint disease)   . Pulmonary emboli (HCC) 02/08/2014   Date of diagnosis February 08 2014, on chest CTA Hospitalized for 3 days Had some symptoms of shortness of breath, and chest pain With intercurrent DVT of the left LE. Duration of anticoagulation: 8 months. End date 10/11/2014.  Anticoagulant: Lovenox 120 units daily Switched to Eliquis on 05/25/2014 per patient preference   . Pulmonary embolism (HCC)   . Sebaceous cyst    on back of neck    Family History  Problem Relation Age of Onset  . Cancer Mother        breast, colon, liver  . Diabetes Father     Past Surgical History:  Procedure Laterality Date  . AMPUTATION Right 06/03/2015   Procedure: Right Below Knee Amputation;  Surgeon: Nadara Mustard, MD;   Location: [facility] OR;  Service: Orthopedics;  Laterality: Right;  . AMPUTATION Left 07/24/2019   Procedure: LEFT FOOT 3RD RAY AMPUTATION;  Surgeon: Nadara Mustard, MD;  Location: [facility] OR;  Service: Orthopedics;  Laterality: Left;  . CLOSED REDUCTION WITH HUMER PIN INSERTION  1974   left hip  . HARDWARE REMOVAL Left 07/21/2014   Procedure: HARDWARE REMOVAL;  Surgeon: Loreta Ave, MD;  Location: [facility];  Service: Orthopedics;  Laterality: Left;  . JOINT REPLACEMENT  2006 (approx)   right hip replaced  . ROTATOR CUFF REPAIR  2005 (approx)   right   . 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 TOTAL HIP ARTHROPLASTY Left 07/21/2014   Procedure: TOTAL HIP ARTHROPLASTY ANTERIOR APPROACH;  Surgeon: Loreta Ave, MD;  Location: [facility] OR;  Service: Orthopedics;  Laterality: Left;   Social History   Occupational History  . Not on file  Tobacco Use  . Smoking status: Never Smoker  . Smokeless tobacco: Never Used  Vaping Use  . Vaping Use: Never used  Substance and Sexual Activity  . Alcohol use: Yes    Alcohol/week: 0.0 standard drinks    Comment: beer and mixed drink maybe 1 - 2 times a month  . Drug use: No  . Sexual activity: Never

## 2020-03-07 ENCOUNTER — Other Ambulatory Visit (INDEPENDENT_AMBULATORY_CARE_PROVIDER_SITE_OTHER): Payer: Medicare HMO

## 2020-03-07 ENCOUNTER — Other Ambulatory Visit: Payer: Self-pay

## 2020-03-07 ENCOUNTER — Other Ambulatory Visit: Payer: Medicare HMO

## 2020-03-07 DIAGNOSIS — Z794 Long term (current) use of insulin: Secondary | ICD-10-CM

## 2020-03-07 DIAGNOSIS — E1165 Type 2 diabetes mellitus with hyperglycemia: Secondary | ICD-10-CM

## 2020-03-07 LAB — COMPREHENSIVE METABOLIC PANEL
ALT: 82 U/L — ABNORMAL HIGH (ref 0–53)
AST: 53 U/L — ABNORMAL HIGH (ref 0–37)
Albumin: 4.1 g/dL (ref 3.5–5.2)
Alkaline Phosphatase: 51 U/L (ref 39–117)
BUN: 18 mg/dL (ref 6–23)
CO2: 29 mEq/L (ref 19–32)
Calcium: 9.8 mg/dL (ref 8.4–10.5)
Chloride: 103 mEq/L (ref 96–112)
Creatinine, Ser: 1.13 mg/dL (ref 0.40–1.50)
GFR: 66.79 mL/min (ref >60.00)
Glucose, Bld: 145 mg/dL — ABNORMAL HIGH (ref 70–99)
Potassium: 4.1 mEq/L (ref 3.5–5.1)
Sodium: 139 mEq/L (ref 135–145)
Total Bilirubin: 0.3 mg/dL (ref 0.2–1.2)
Total Protein: 7.2 g/dL (ref 6.0–8.3)

## 2020-03-07 LAB — LIPID PANEL
Cholesterol: 210 mg/dL — ABNORMAL HIGH (ref 0–200)
HDL: 43.8 mg/dL (ref >39.00)
LDL Cholesterol: 145 mg/dL — ABNORMAL HIGH (ref 0–99)
NonHDL: 166.53
Total CHOL/HDL Ratio: 5
Triglycerides: 107 mg/dL (ref 0.0–149.0)
VLDL: 21.4 mg/dL (ref 0.0–40.0)

## 2020-03-07 LAB — HEMOGLOBIN A1C: Hgb A1c MFr Bld: 7.1 % — ABNORMAL HIGH (ref 4.6–6.5)

## 2020-03-10 ENCOUNTER — Encounter: Payer: Self-pay | Admitting: Endocrinology

## 2020-03-10 ENCOUNTER — Other Ambulatory Visit: Payer: Self-pay

## 2020-03-10 ENCOUNTER — Ambulatory Visit (INDEPENDENT_AMBULATORY_CARE_PROVIDER_SITE_OTHER): Payer: Medicare HMO | Admitting: Endocrinology

## 2020-03-10 VITALS — BP 140/80 | HR 94 | Ht 72.0 in | Wt 205.4 lb

## 2020-03-10 DIAGNOSIS — E7800 Pure hypercholesterolemia, unspecified: Secondary | ICD-10-CM

## 2020-03-10 DIAGNOSIS — E1165 Type 2 diabetes mellitus with hyperglycemia: Secondary | ICD-10-CM

## 2020-03-10 DIAGNOSIS — Z794 Long term (current) use of insulin: Secondary | ICD-10-CM | POA: Diagnosis not present

## 2020-03-10 DIAGNOSIS — R748 Abnormal levels of other serum enzymes: Secondary | ICD-10-CM

## 2020-03-10 NOTE — Progress Notes (Signed)
Gregory May 59 y.o.           Reason for Appointment: follow-up   History of Present Illness   Diagnosis: Type 2 DIABETES MELITUS, date of diagnosis:  1999     Previous history: He has previously been treated with metformin and Victoza and subsequently mealtime insulin added to control postprandial hyperglycemia Overall he has been  relatively noncompliant with his diet, medications, monitoring and followup Previously would not take Victoza regularly because of cost. Has not been taking any medications for the last year and a half because of commitments to family and cost A1c had increased to 12.3% and hyperglycemia discovered when he was hospitalized for foot ulcer. Also lost 20 pounds because of hyperglycemia   For better control, compliance and inconvenience he was switched to the V-go pump on 02/21/16  Recent history:   Insulin regimen: V-go 20 unit basal with Novolog insulin, mealtime boluses 12-16 breakfast --- 12 -20 units before dinner  Non-insulin hypoglycemic drugs: Metformin ER 1500 mg daily    Jardiance 32m daily    A1c  is 7.1 compared to 7.2     Management, blood sugar patterns and problems identified.  He did go up to 25 mg of Jardiance but blood sugars are not looking any better  However not clear if he has consistently high readings in the daytime  Fasting readings tend to be a little higher but more so if he is eating a snack like a cookie at bedtime  He says he still cutting back a little bit on his sweets but can do better, usually will take extra clicks for snacks  Blood sugars are generally fairly good with averages about 130-140 at most times  Not checking readings 2 hours after dinner  Not able to do much exercise and weight is slightly higher  No episodes of hypoglycemia  Changing pump at the same time in am regularly  Renal function stable with increasing Jardiance       Side effects from medications: None       Glucometer:   Accu-Chek guide          Blood Glucose readings by    PRE-MEAL Fasting Lunch Dinner Bedtime Overall  Glucose range:  125-158  99  128-157  87-162   Mean/median: 138   132 136   POST-MEAL PC Breakfast PC Lunch PC Dinner  Glucose range:  92    Mean/median:      Previous readings:   PRE-MEAL Fasting Lunch Dinner Bedtime Overall  Glucose range: 135-140      Mean/median:        POST-MEAL PC Breakfast PC Lunch PC Dinner  Glucose range:   150-240  Mean/median:       Meals:  usually 2 meals per day usually.  breakfast at 12 noon, evening meal 7-9 pm, variable snacks late at night    Physical activity: exercise: Minimal               Weight control:   Wt Readings from Last 3 Encounters:  03/10/20 (!) 205 lb 6.4 oz (93.2 kg)  12/10/19 202 lb 9.6 oz (91.9 kg)  10/26/19 211 lb (95.7 kg)          Diabetes labs:  Lab Results  Component Value Date   HGBA1C 7.1 (H) 03/07/2020   HGBA1C 7.2 (H) 12/03/2019   HGBA1C 6.8 (H) 09/07/2019   Lab Results  Component Value Date   MICROALBUR 5.0 (H) 12/03/2019   LDenver  145 (H) 03/07/2020   CREATININE 1.13 03/07/2020    Lab Results  Component Value Date   FRUCTOSAMINE 272 03/03/2019   FRUCTOSAMINE 243 03/07/2017   FRUCTOSAMINE 274 10/30/2016     Other active problems: See review of systems    Lab on 03/07/2020  Component Date Value Ref Range Status   Cholesterol 03/07/2020 210* 0 - 200 mg/dL Final   ATP III Classification       Desirable:  < 200 mg/dL               Borderline High:  200 - 239 mg/dL          High:  > = 240 mg/dL   Triglycerides 03/07/2020 107.0  0 - 149 mg/dL Final   Normal:  <150 mg/dLBorderline High:  150 - 199 mg/dL   HDL 03/07/2020 43.80  >39.00 mg/dL Final   VLDL 03/07/2020 21.4  0.0 - 40.0 mg/dL Final   LDL Cholesterol 03/07/2020 145* 0 - 99 mg/dL Final   Total CHOL/HDL Ratio 03/07/2020 5   Final                  Men          Women1/2 Average Risk     3.4          3.3Average Risk          5.0           4.42X Average Risk          9.6          7.13X Average Risk          15.0          11.0                       NonHDL 03/07/2020 166.53   Final   NOTE:  Non-HDL goal should be 30 mg/dL higher than patient's LDL goal (i.e. LDL goal of < 70 mg/dL, would have non-HDL goal of < 100 mg/dL)   Sodium 03/07/2020 139  135 - 145 mEq/L Final   Potassium 03/07/2020 4.1  3.5 - 5.1 mEq/L Final   Chloride 03/07/2020 103  96 - 112 mEq/L Final   CO2 03/07/2020 29  19 - 32 mEq/L Final   Glucose, Bld 03/07/2020 145* 70 - 99 mg/dL Final   BUN 03/07/2020 18  6 - 23 mg/dL Final   Creatinine, Ser 03/07/2020 1.13  0.40 - 1.50 mg/dL Final   Total Bilirubin 03/07/2020 0.3  0.2 - 1.2 mg/dL Final   Alkaline Phosphatase 03/07/2020 51  39 - 117 U/L Final   AST 03/07/2020 53* 0 - 37 U/L Final   ALT 03/07/2020 82* 0 - 53 U/L Final   Total Protein 03/07/2020 7.2  6.0 - 8.3 g/dL Final   Albumin 03/07/2020 4.1  3.5 - 5.2 g/dL Final   GFR 03/07/2020 66.79  >60.00 mL/min Final   Calcium 03/07/2020 9.8  8.4 - 10.5 mg/dL Final   Hgb A1c MFr Bld 03/07/2020 7.1* 4.6 - 6.5 % Final   Glycemic Control Guidelines for People with Diabetes:Non Diabetic:  <6%Goal of Therapy: <7%Additional Action Suggested:  >8%      Allergies as of 03/10/2020   No Known Allergies     Medication List       Accurate as of March 10, 2020  3:13 PM. If you have any questions, ask your nurse or doctor.  Accu-Chek FastClix Lancets Misc Use Accu Chek Fastclix lancets to check blood sugar three times daily. DX:E11.65   Accu-Chek Guide Me w/Device Kit 1 each by Does not apply route daily.   Accu-Chek Guide test strip Generic drug: glucose blood Use as instructed to check blood sugar three times daily. DX:E11.65   acetaminophen 325 MG tablet Commonly known as: TYLENOL Take 650 mg by mouth every 6 (six) hours as needed (pain).   doxycycline 100 MG tablet Commonly known as: VIBRA-TABS Take 1 tablet (100 mg total) by  mouth 2 (two) times daily.   empagliflozin 25 MG Tabs tablet Commonly known as: Jardiance Take 1 tablet (25 mg total) by mouth daily.   gabapentin 300 MG capsule Commonly known as: NEURONTIN Take 1 capsule (300 mg total) by mouth 4 (four) times daily as needed.   insulin aspart 100 UNIT/ML injection Commonly known as: novoLOG Max of 56 units daily in V-Go pump   metFORMIN 750 MG 24 hr tablet Commonly known as: GLUCOPHAGE-XR Take 2 tablets by mouth once daily   oxyCODONE-acetaminophen 5-325 MG tablet Commonly known as: Percocet Take 1 tablet by mouth every 4 (four) hours as needed for severe pain.   pravastatin 80 MG tablet Commonly known as: PRAVACHOL Take 1 tablet by mouth once daily   V-Go 20 Kit APPLY V-GO 20 PUMP TO ARM ONE TIME DAILY AS DIRECTED       Allergies: No Known Allergies  Past Medical History:  Diagnosis Date   Arthritis    bilateral hips   Deep vein thrombosis (DVT) (HCC)    Diabetes mellitus    type II   Diabetic ulcer of heel (HCC)    Right heel   DJD (degenerative joint disease)    Pulmonary emboli ([place]) 02/08/2014   Date of diagnosis February 08 2014, on chest CTA Hospitalized for 3 days Had some symptoms of shortness of breath, and chest pain With intercurrent DVT of the left LE. Duration of anticoagulation: 8 months. End date 10/11/2014.  Anticoagulant: Lovenox 120 units daily Switched to Eliquis on 05/25/2014 per patient preference    Pulmonary embolism ([place])    Sebaceous cyst    on back of neck    Past Surgical History:  Procedure Laterality Date   AMPUTATION Right 06/03/2015   Procedure: Right Below Knee Amputation;  Surgeon: [name], MD;  Location: [place];  Service: Orthopedics;  Laterality: Right;   AMPUTATION Left 07/24/2019   Procedure: LEFT FOOT 3RD RAY AMPUTATION;  Surgeon: [name], MD;  Location: [place];  Service: Orthopedics;  Laterality: Left;   CLOSED REDUCTION WITH HUMER PIN INSERTION  1974   left hip    HARDWARE REMOVAL Left 07/21/2014   Procedure: HARDWARE REMOVAL;  Surgeon: Ninetta Lights, MD;  Location: [place];  Service: Orthopedics;  Laterality: Left;   JOINT REPLACEMENT  2006 (approx)   right hip replaced   ROTATOR CUFF REPAIR  2005 (approx)   right    TOTAL HIP ARTHROPLASTY Left 07/21/2014   DR MURPHY   TOTAL HIP ARTHROPLASTY Left 07/21/2014   Procedure: TOTAL HIP ARTHROPLASTY ANTERIOR APPROACH;  Surgeon: Ninetta Lights, MD;  Location: [place];  Service: Orthopedics;  Laterality: Left;    Family History  Problem Relation Age of Onset   Cancer Mother        breast, colon, liver   Diabetes Father     Social History:  reports that he has never smoked. He has never used smokeless tobacco. He  reports current alcohol use. He reports that he does not use drugs.  Review of Systems:   Lipids: He is on Pravachol  with variable control Pravastatin has been on hold because of increased liver functions on the last visit  LDL is higher as expected   Lab Results  Component Value Date   CHOL 210 (H) 03/07/2020   CHOL 174 12/03/2019   CHOL 164 05/12/2019   Lab Results  Component Value Date   HDL 43.80 03/07/2020   HDL 49.10 12/03/2019   HDL 44.00 05/12/2019   Lab Results  Component Value Date   LDLCALC 145 (H) 03/07/2020   LDLCALC 106 (H) 12/03/2019   LDLCALC 90 05/12/2019   Lab Results  Component Value Date   TRIG 107.0 03/07/2020   TRIG 90.0 12/03/2019   TRIG 149.0 05/12/2019   Lab Results  Component Value Date   CHOLHDL 5 03/07/2020   CHOLHDL 4 12/03/2019   CHOLHDL 4 05/12/2019   Lab Results  Component Value Date   LDLDIRECT 91.0 07/11/2015    Has history of abnormal liver functions, these appear to be consistently high now Not clear if this is from fatty liver, has not had any imaging studies before Currently blood sugar control is fairly good  No recent history of alcohol excess Has not followed up with his PCP for evaluation  Lab Results   Component Value Date   ALT 82 (H) 03/07/2020   ALT 84 (H) 12/03/2019   ALT 52 09/07/2019   ALT 51 05/12/2019   ALT 45 03/05/2019     HYPERTENSION: This has been mild Controlled with Jardiance Blood pressure is high normal occasionally  Renal function normal Microalbumin normal as of 4/21   BP Readings from Last 3 Encounters:  03/10/20 (!) 140/80  12/10/19 122/80  09/10/19 140/70   Lab Results  Component Value Date   CREATININE 1.13 03/07/2020   CREATININE 0.95 12/03/2019   CREATININE 1.03 09/07/2019   NEUROPATHY:  He has had some burning in the left foot occasionally However he is having discomfort, paresthesia, some numbness and difficulty using his right hand which is spreading to the left also Sometimes this will be better with dangling his hand   Currently taking gabapentin 2-3 times a day  He is again having issues with foot ulcers, now on his left and has had a toe amputation, followed by orthopedic surgeon   Examination:   BP (!) 140/80 (BP Location: Left Arm, Patient Position: Sitting, Cuff Size: Normal)    Pulse 94    Ht 6' (1.829 m)    Wt (!) 205 lb 6.4 oz (93.2 kg)    SpO2 96%    BMI 27.86 kg/m   Body mass index is 27.86 kg/m.   ASSESSMENT/ PLAN:     Diabetes type 2:  See history of present illness for detailed discussion of current diabetes management, blood sugar patterns and problems identified  A1c is 7.1  He is on a V-go pump along with metformin and Jardiance, now 25 mg  His blood sugars are only averaging 136 A1c indicates his average should be about 154, may be relatively higher when he goes off his diet or may be higher within 2 hours of his evening meal when he is not monitoring Otherwise compliance is good Weight is slightly higher but may be from his going on vacation  No change in medication or treatment regimen but he will try to work on his diet and snacks  LIPIDS: LDL  is over 100 and since liver function abnormalities  independent of pravastatin will resume this now   Abnormal liver functions: Liver functions are consistently higher Needs full evaluation and refer him to gastroenterologist   Follow-up in 3 months  There are no Patient Instructions on file for this visit.    Elayne Snare 03/10/2020, 3:13 PM   Note: This office note was prepared with Dragon voice recognition system technology. Any transcriptional errors that result from this process are unintentional.

## 2020-03-10 NOTE — Patient Instructions (Addendum)
Check blood sugars on waking up 3 days a week  Also check blood sugars about 2 hours after meals and do this after different meals by rotation  Recommended blood sugar levels on waking up are 90-130 and about 2 hours after meal is 130-170  Please bring your blood sugar monitor to each visit, thank you  Resume Pravastatin

## 2020-03-25 ENCOUNTER — Encounter: Payer: Self-pay | Admitting: Nurse Practitioner

## 2020-03-25 ENCOUNTER — Other Ambulatory Visit: Payer: Self-pay | Admitting: Endocrinology

## 2020-03-25 ENCOUNTER — Telehealth: Payer: Self-pay | Admitting: Endocrinology

## 2020-03-25 NOTE — Telephone Encounter (Signed)
Patient called asking for the phone number for the patient assistance program that he uses for his Novolog because he needs a refill [employer]). I gave it to him, I am just documenting our conversation.

## 2020-03-28 NOTE — Telephone Encounter (Signed)
Novo Nordisk refill form has been filled out, and will get MD signature upon his return to the office.

## 2020-04-04 ENCOUNTER — Telehealth: Payer: Self-pay | Admitting: Student

## 2020-04-04 NOTE — Telephone Encounter (Signed)
Pls contact [phone]

## 2020-04-04 NOTE — Telephone Encounter (Signed)
Pt calls and states someone called him and left message that he needed an appt, appt made w/ dr Claudette Laws per Lamonte Richer

## 2020-04-14 ENCOUNTER — Other Ambulatory Visit: Payer: Self-pay | Admitting: *Deleted

## 2020-04-14 ENCOUNTER — Other Ambulatory Visit: Payer: Self-pay | Admitting: Endocrinology

## 2020-04-14 ENCOUNTER — Telehealth: Payer: Self-pay | Admitting: Endocrinology

## 2020-04-14 MED ORDER — METFORMIN HCL ER 750 MG PO TB24: 1500.0000 mg | ORAL_TABLET | Freq: Every day | ORAL | 0 refills | Status: DC

## 2020-04-14 NOTE — Telephone Encounter (Signed)
Patient called to request a new RX for Metformin ER 750 mg with refills sent to:  [facility] - [place], [place] - [address] Phone:  [phone]  Fax:  [phone]

## 2020-04-15 NOTE — Telephone Encounter (Signed)
Patient called asking if Anette Riedel could explain to him as to maybe why he was not approved for the Novolog? He got a Physicist, medical from [employer]. Ph# [phone] he said he would also call them. He said last time, we were able to send another request to them and it got approved?

## 2020-04-19 NOTE — Telephone Encounter (Signed)
We have not received additional paperwork yet for this patient with denial reason.

## 2020-04-19 NOTE — Telephone Encounter (Signed)
This office will not know the reason for denial until fax is received from the foundation with an explanation. Because I no longer work with Dr. Lucianne Muss, I am deferring this to C.Thomas,CMA to address as she has all of the faxes received for Dr. Lucianne Muss. If a form from [employer] has been received with a reason for denial regarding this pt, she would have it.

## 2020-04-20 ENCOUNTER — Other Ambulatory Visit: Payer: Self-pay

## 2020-04-20 ENCOUNTER — Ambulatory Visit (INDEPENDENT_AMBULATORY_CARE_PROVIDER_SITE_OTHER): Payer: Medicare HMO | Admitting: Student

## 2020-04-20 ENCOUNTER — Encounter: Payer: Self-pay | Admitting: Student

## 2020-04-20 VITALS — BP 146/88 | HR 69 | Temp 98.6°F | Wt 202.6 lb

## 2020-04-20 DIAGNOSIS — M869 Osteomyelitis, unspecified: Secondary | ICD-10-CM | POA: Diagnosis not present

## 2020-04-20 DIAGNOSIS — Z23 Encounter for immunization: Secondary | ICD-10-CM | POA: Diagnosis not present

## 2020-04-20 DIAGNOSIS — R7989 Other specified abnormal findings of blood chemistry: Secondary | ICD-10-CM | POA: Diagnosis not present

## 2020-04-20 DIAGNOSIS — E118 Type 2 diabetes mellitus with unspecified complications: Secondary | ICD-10-CM | POA: Diagnosis not present

## 2020-04-20 DIAGNOSIS — M549 Dorsalgia, unspecified: Secondary | ICD-10-CM | POA: Insufficient documentation

## 2020-04-20 DIAGNOSIS — M545 Low back pain, unspecified: Secondary | ICD-10-CM

## 2020-04-20 DIAGNOSIS — M25562 Pain in left knee: Secondary | ICD-10-CM

## 2020-04-20 DIAGNOSIS — Z2821 Immunization not carried out because of patient refusal: Secondary | ICD-10-CM | POA: Insufficient documentation

## 2020-04-20 DIAGNOSIS — J309 Allergic rhinitis, unspecified: Secondary | ICD-10-CM

## 2020-04-20 DIAGNOSIS — Z1211 Encounter for screening for malignant neoplasm of colon: Secondary | ICD-10-CM

## 2020-04-20 DIAGNOSIS — R748 Abnormal levels of other serum enzymes: Secondary | ICD-10-CM

## 2020-04-20 DIAGNOSIS — G8929 Other chronic pain: Secondary | ICD-10-CM

## 2020-04-20 DIAGNOSIS — E7800 Pure hypercholesterolemia, unspecified: Secondary | ICD-10-CM | POA: Diagnosis not present

## 2020-04-20 DIAGNOSIS — E1165 Type 2 diabetes mellitus with hyperglycemia: Secondary | ICD-10-CM

## 2020-04-20 MED ORDER — FLUTICASONE PROPIONATE 50 MCG/ACT NA SUSP: 2.0000 | Freq: Every day | NASAL | 1 refills | Status: DC

## 2020-04-20 MED ORDER — DICLOFENAC SODIUM 1 % EX GEL: 2.0000 g | Freq: Four times a day (QID) | CUTANEOUS | 1 refills | Status: DC

## 2020-04-20 NOTE — Telephone Encounter (Signed)
Tried [employer] and finding out why pt was denied. After 30 minutes of being on hold, no one had answered. Will attempt to call back at a later time.

## 2020-04-20 NOTE — Assessment & Plan Note (Signed)
Patient would like to defer colon cancer screening at this time.  He is currently scheduled to see a GI specialist for slight elevation of AST/ALT (referred by his endocrinologist) and would like to discuss colon cancer screening at that time.  Patient reports his mother died from colon cancer at 57 years old.  Plan: -Follow-up with patient regarding colon cancer screening

## 2020-04-20 NOTE — Assessment & Plan Note (Signed)
Patient has not been vaccinated against COVID-19.  He was previously infected with COVID-19 in 2020.  When asked if he is interested in getting vaccinated, he expresses apprehension regarding the safety of the vaccine.  -I counseled him regarding the vaccine efficacy, emphasizing our strong recommendation to get vaccinated especially with his chronic health conditions. -Counseled him that should he be interested in the future he can obtain a vaccination at any pharmacy.  Also provided him with the [facility] COVID-19 hotline.

## 2020-04-20 NOTE — Assessment & Plan Note (Signed)
Flu vaccine given today. 

## 2020-04-20 NOTE — Assessment & Plan Note (Signed)
Last lipid panel obtained 03/07/2020 by endocrinologist Dr. Lucianne Muss.  Patient is taking pravastatin 80 mg daily.  This was discontinued by Dr. Lucianne Muss after the patient had slightly elevated AST/ALT, however since the AST/ALT did not improve after temporary cessation of the statin, the statin was resumed.  Plan: -Continue pravastatin 80 mg daily -Repeat lipid panel is pended

## 2020-04-20 NOTE — Assessment & Plan Note (Signed)
Hemoglobin A1c 7.1% on 03/07/2020.  He follows with endocrinology (Dr. Lucianne Muss).  I will not obtain an A1c today as it has been less than 3 months since his last.  I will defer treatment of his diabetes to endocrinology.  Plan: -Continue V-go pump -Continue empagliflozin 25 mg daily -Continue Metformin ER 1500 mg daily

## 2020-04-20 NOTE — Assessment & Plan Note (Signed)
The patient reports a 1 year history of intermittent left sided low back pain.  He describes the pain as dull.  Reports that the pain is brought on with physical activity, especially after a day of more walking.  He states the pain does not radiate down his legs or anywhere else.  He has tried taking an extra gabapentin which he is prescribed for diabetic neuropathy, however he states this seems to just make him more sleepy.  He denies red flag symptoms such as recent fever/chills, unintentional weight loss, weakness, or loss of bowel or bladder function.    On exam, there is no paraspinal tenderness to palpation, no tenderness over the spine, and bilateral straight leg raise testing is negative.  Assessment/Plan:  The pain seems to be more musculoskeletal in nature.   -Advised the patient to try ibuprofen as needed.  Cautioned him to avoid daily use. -Encouraged daily walking and stretching as tolerated. -Discussed referral to physical therapy, however patient would like to try these other measures first as he has limited transportation. -Could consider trial of cyclobenzaprine in the future, however the pain does not seem related to spasm at this time.

## 2020-04-20 NOTE — Assessment & Plan Note (Addendum)
Recent CMP from 4 months ago demonstrated AST/ALT of 63/84.  Statin was held by endocrinologist, and repeat 1 month ago was 53/82.  Statin was resumed.  The patient has a history of heavy alcohol use, however reports he drinks socially now about once a month, may be 2-3 beers or 1 mixed drink.  He was referred to a GI doctor by his endocrinologist, and this appointment is upcoming.  Plan:  - Not sure what to make of these slight elevations in his AST/ALT.  - Will repeat the CMP and obtain hepatitis labs today in anticipation of his upcoming appointment with GI. If these labs are abnormal, may consider RUQ ultrasound.  ADDENDUM: - Hepatitis serologies negative. Results discussed with patient - repeat CMP with stably elevated AST/ALT 65/80 - Order for RUQ ultrasound placed on 04/21/20

## 2020-04-20 NOTE — Patient Instructions (Addendum)
[name],   Thank you for your visit to the [facility] today. It was a pleasure seeing you. Today we discussed the following:  1) Back pain: - Your pain seems most consistent with muscle strain. We recommend using ibuprofen (Advil) as needed, however advise you to take with food and do not use daily. - Recommend staying active and doing exercises you have learned previously in physical therapy.  2) Left knee pain: - I have prescribed Voltaren gel to apply to your knee   3) Liver testing: - We are repeating the lab test to monitor your liver function - We are also testing for inflammation of the liver with hepatitis blood tests - Recommend you cut down and preferably stop drinking alcohol altogether  4) Sinus pressure: - Your symptoms could be due to allergies. I have prescribed Flonase to use daily - You may also want to try a daily over the counter allergy medicine such as Zyrtec - Please give [place] a call if your symptoms worsen and/or you develop shortness of breath, cough, diarrhea, etc.   5) Vaccinations - We discussed the flu shot today. Recommend you get the flu shot ASAP either here in clinic or at any pharmacy - We discussed the COVID-19 vaccine today. We highly recommend you get this vaccine since you have significant medical issues. You can also get this at any retail pharmacy. The number for the [facility] COVID-19 hotline to schedule vaccination through them is: [phone]    We would like to see you back in 2-3 months. I will give you a call if any of your lab tests are abnormal. Please bring all of your medicines with you.   If you have any questions or concerns, please call our clinic at [phone] between 9am-5pm. Outside of these hours, call [phone] and ask for the internal medicine resident on call. If you feel you are having a medical emergency please call 911.

## 2020-04-20 NOTE — Progress Notes (Signed)
   CC: back pain, knee pain, sinus pressure  HPI:  Gregory May is a 59 y.o. person with history of poorly controlled diabetes complicated by neuropathy and diabetic foot disease, HTN, and HLD  who presents to clinic for health maintenance as well as evaluation of back pain, knee pain, and sinus pressure. His last clinic visit was ~9 months ago on 07/15/19.   To see the details of this patient's management of their acute and chronic problems, please refer to the A&P under the Encounters tab.    Past Medical History:  Diagnosis Date  . Arthritis    bilateral hips  . Deep vein thrombosis (DVT) (HCC)   . Diabetes mellitus    type II  . 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 DJD (degenerative joint disease)   . Pulmonary emboli (HCC) 02/08/2014   Date of diagnosis February 08 2014, on chest CTA Hospitalized for 3 days Had some symptoms of shortness of breath, and chest pain With intercurrent DVT of the left LE. Duration of anticoagulation: 8 months. End date 10/11/2014.  Anticoagulant: Lovenox 120 units daily Switched to Eliquis on 05/25/2014 per patient preference   . Pulmonary embolism (HCC)   . Sebaceous cyst    on back of neck   Review of Systems:    Review of Systems  Constitutional: Negative for malaise/fatigue and weight loss.  HENT: Positive for congestion and sinus pain. Negative for ear discharge, ear pain and hearing loss.   Respiratory: Negative for cough, sputum production and shortness of breath.   Cardiovascular: Negative for chest pain and leg swelling.  Gastrointestinal: Negative for abdominal pain, diarrhea, nausea and vomiting.  Musculoskeletal: Positive for back pain and joint pain.  Neurological: Negative for dizziness, focal weakness, weakness and headaches.    Physical Exam:  There were no vitals filed for this visit. Constitutional: pleasant gentleman sitting in chair, in no acute distress HENT: bilateral ear canals non-erythematous, bilateral  tympanic membranes pearly grey and shiny with no bulging or retraction; throat is non-erythematous; mucous membranes moist; inferior meatus boggy and blue-tinged; clear discharge Eyes: conjunctivae non-erythematous Cardiovascular: regular rate and rhythm, no m/r/g Pulmonary/Chest: Normal work of breathing on room air, clear to auscultation bilaterally Abdominal: Soft, nontender MSK: No paraspinal tenderness to palpation, no tenderness over the spine, bilateral straight leg raise tests negative.  No joint line tenderness to palpation of the left knee, no crepitus with active or passive knee flexion or extension, no erythema or swelling of the left knee; right above-the-knee amputation Neurological: 5 out of 5 strength in the bilateral lower extremities   Assessment & Plan:   See Encounters Tab for problem based charting.  Patient seen with Dr. Heide Spark

## 2020-04-21 LAB — CMP14 + ANION GAP
ALT: 80 IU/L — ABNORMAL HIGH (ref 0–44)
AST: 65 IU/L — ABNORMAL HIGH (ref 0–40)
Albumin/Globulin Ratio: 1.7 (ref 1.2–2.2)
Albumin: 4.5 g/dL (ref 3.8–4.9)
Alkaline Phosphatase: 64 IU/L (ref 48–121)
Anion Gap: 15 mmol/L (ref 10.0–18.0)
BUN/Creatinine Ratio: 14 (ref 9–20)
BUN: 11 mg/dL (ref 6–24)
Bilirubin Total: 0.3 mg/dL (ref 0.0–1.2)
CO2: 24 mmol/L (ref 20–29)
Calcium: 9.6 mg/dL (ref 8.7–10.2)
Chloride: 100 mmol/L (ref 96–106)
Creatinine, Ser: 0.81 mg/dL (ref 0.76–1.27)
GFR calc Af Amer: 114 mL/min/{1.73_m2} (ref >59)
GFR calc non Af Amer: 99 mL/min/{1.73_m2} (ref >59)
Globulin, Total: 2.7 g/dL (ref 1.5–4.5)
Glucose: 97 mg/dL (ref 65–99)
Potassium: 4.3 mmol/L (ref 3.5–5.2)
Sodium: 139 mmol/L (ref 134–144)
Total Protein: 7.2 g/dL (ref 6.0–8.5)

## 2020-04-21 LAB — HEPATITIS B SURFACE ANTIBODY,QUALITATIVE: Hep B Surface Ab, Qual: NONREACTIVE

## 2020-04-21 LAB — HEPATITIS B SURFACE ANTIGEN: Hepatitis B Surface Ag: NEGATIVE

## 2020-04-21 LAB — HEPATITIS A ANTIBODY, TOTAL: hep A Total Ab: NEGATIVE

## 2020-04-21 LAB — HEPATITIS B CORE ANTIBODY, TOTAL: Hep B Core Total Ab: NEGATIVE

## 2020-04-21 LAB — HEPATITIS C ANTIBODY: Hep C Virus Ab: <0.1 s/co ratio (ref 0.0–0.9)

## 2020-04-21 NOTE — Addendum Note (Signed)
Addended by: Alphonzo [place] on: 04/21/2020 08:50 AM   Modules accepted: Orders

## 2020-04-22 ENCOUNTER — Other Ambulatory Visit: Payer: Self-pay | Admitting: Endocrinology

## 2020-04-28 NOTE — Progress Notes (Signed)
Internal Medicine Clinic Attending  I saw and evaluated the patient.  I personally confirmed the key portions of the history and exam documented by Dr. Watson and I reviewed pertinent patient test results.  The assessment, diagnosis, and plan were formulated together and I agree with the documentation in the resident's note.  

## 2020-05-05 ENCOUNTER — Telehealth: Payer: Self-pay

## 2020-05-05 MED ORDER — V-GO 20 KIT: PACK | 0 refills | Status: DC

## 2020-05-05 NOTE — Telephone Encounter (Signed)
Insulin Disposable Pump (V-GO 20) KIT [[id number]]  pt wants 3 month supply prescription sent to [facility].

## 2020-05-09 ENCOUNTER — Encounter: Payer: Self-pay | Admitting: Nurse Practitioner

## 2020-05-09 ENCOUNTER — Ambulatory Visit (INDEPENDENT_AMBULATORY_CARE_PROVIDER_SITE_OTHER): Payer: Medicare HMO | Admitting: Nurse Practitioner

## 2020-05-09 ENCOUNTER — Other Ambulatory Visit (INDEPENDENT_AMBULATORY_CARE_PROVIDER_SITE_OTHER): Payer: Medicare HMO

## 2020-05-09 VITALS — BP 110/80 | HR 76 | Ht 69.25 in | Wt 204.2 lb

## 2020-05-09 DIAGNOSIS — R7989 Other specified abnormal findings of blood chemistry: Secondary | ICD-10-CM

## 2020-05-09 LAB — HEPATIC FUNCTION PANEL
ALT: 69 U/L — ABNORMAL HIGH (ref 0–53)
AST: 55 U/L — ABNORMAL HIGH (ref 0–37)
Albumin: 4.2 g/dL (ref 3.5–5.2)
Alkaline Phosphatase: 59 U/L (ref 39–117)
Bilirubin, Direct: 0.1 mg/dL (ref 0.0–0.3)
Total Bilirubin: 0.5 mg/dL (ref 0.2–1.2)
Total Protein: 7.8 g/dL (ref 6.0–8.3)

## 2020-05-09 LAB — IGA: IgA: 225 mg/dL (ref 68–378)

## 2020-05-09 LAB — FERRITIN: Ferritin: 77.4 ng/mL (ref 22.0–322.0)

## 2020-05-09 LAB — PROTIME-INR
INR: 1.1 ratio — ABNORMAL HIGH (ref 0.8–1.0)
Prothrombin Time: 12.4 s (ref 9.6–13.1)

## 2020-05-09 NOTE — Patient Instructions (Addendum)
If you are age 57 or older, your body mass index should be between 23-30. Your Body mass index is 29.95 kg/m. If this is out of the aforementioned range listed, please consider follow up with your Primary Care Provider.  If you are age 64 or younger, your body mass index should be between 19-25. Your Body mass index is 29.95 kg/m. If this is out of the aformentioned range listed, please consider follow up with your Primary Care Provider.    Your provider has requested that you go to the basement level for lab work before leaving today. Press "B" on the elevator. The lab is located at the first door on the left as you exit the elevator.  Please schedule a follow up appointment with Gunnar Fusi around the first week of November 2021. Her schedule is not available at this time. If you would call [phone] around the end of October to schedule. You also will need to return to the lab after 06/20/2020 to have a hepatic function panel drawn.  Please refrain from alcohol, Motrin, Ibupropen use until your next appointment.  Due to recent changes in healthcare laws, you may see the results of your imaging and laboratory studies on MyChart before your provider has had a chance to review them.  We understand that in some cases there may be results that are confusing or concerning to you. Not all laboratory results come back in the same time frame and the provider may be waiting for multiple results in order to interpret others.  Please give [place] 48 hours in order for your provider to thoroughly review all the results before contacting the office for clarification of your results.   Thank you for choosing me and [facility]  Willette Cluster, NP

## 2020-05-09 NOTE — Telephone Encounter (Signed)
Pt asking have we received Novolog from the pt assistant [employer]..  Pt said he was denied and called [employer] & was told they needed additional info from him. Pt provided info & they told him it would be sent in 7-10 business days.

## 2020-05-09 NOTE — Progress Notes (Signed)
   ASSESSMENT AND PLAN    # Mildly elevated liver enzymes ( chronic, intermittent)  -- AST and ALT less than 2 x ULN. Remainder of LFTs normal.  --Has RUQ US ordered by PCP --Suspect fatty liver disease but will obtain labs to evaluate for other etiologies of chronic liver disease --Though pattern of liver enzyme elevation not typical for Etoh I have asked him to refrain from Etoh use over the next 6-8 weeks at which time we will repeat labs and see him in follow up. . Patient says cessation of etoh will not be a problem  --Avoid NSAIDs for now (he doesn't take much of them)  # Colon cancer screening --Patient has never had a colonoscopy. Doesn't want one right now. Says he has a lot going on and doesn't have transportation.  --Will discuss again at follow up visit.   # Diabetes --Has insulin pump and on metformin and jardiance --A1c 7.1 in July  HISTORY OF PRESENT ILLNESS     Primary Gastroenterologist : new ( [facility]) Chief Complaint : abnormal liver tests.   Gregory May is a Gregory May with PMH / PSH significant for,  but not necessarily limited to: hyperlipidemia, DVT, bilateral PE , diabetes, peripheral neuropathy  Patient is a [age] yo May referred by PCP for elevated LFTs.  Seldom takes Tylenol. In the last three weeks he has been taking Ibuprofen but not more than 3 doses a week. Takes a daily multivitamin. No herbs. Endocrinologist stopped stain a few months ago but LFTs didn't improve so statin resumed. No FMH of liver disease. Between ages [age] he drank heavily. . In the last few years he has averaged only 3-4 beers a week. He has no upper abdominal pain but some mild discomfort in LLQ since PCP "poked around" on exam. BMs normal. No blood in stool.   Data Reviewed: 9/821       AST 65  / ALT 80 03/07/20    AST 53  / ALT 82 08/2019     Normal 07/18/18    AST 45 / ALT 47 01/20/19      AST 55 / ALT 65    Past Medical History:  Diagnosis Date  . Arthritis     bilateral hips  . Cutaneous abscess of left foot   . Deep vein thrombosis (DVT) (HCC)   . Diabetes mellitus    type II  . 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 DJD (degenerative joint disease)   . DVT (deep venous thrombosis) (HCC) 02/08/2014   Proximal provoked. Date of diagnosis February 08 2014 Duration of anticoagulation: 6 months. End date 08/12/2014.  Anticoagulant: Lovenox 120 units daily Switched to Eliquis on 05/25/2014     . 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 Non-pressure chronic ulcer of right calf, limited to breakdown of skin (HCC) 04/17/2017  . Pulmonary emboli (HCC) 02/08/2014   Date of diagnosis February 08 2014, on chest CTA Hospitalized for 3 days Had some symptoms of shortness of breath, and chest pain With intercurrent DVT of the left LE. Duration of anticoagulation: 8 months. End date 10/11/2014.  Anticoagulant: Lovenox 120 units daily Switched to Eliquis on 05/25/2014 per patient preference   . Pulmonary embolism (HCC)   . Sebaceous cyst    on back of neck     Past Surgical History:  Procedure Laterality Date  . AMPUTATION Right 06/03/2015   Procedure: Right Below Knee Amputation;  Surgeon:   [name], MD;  Location: [facility];  Service: Orthopedics;  Laterality: Right;  . AMPUTATION Left 07/24/2019   Procedure: LEFT FOOT 3RD RAY AMPUTATION;  Surgeon: [name], MD;  Location: [facility];  Service: Orthopedics;  Laterality: Left;  . CLOSED REDUCTION WITH HUMER PIN INSERTION  1974   left hip  . HARDWARE REMOVAL Left 07/21/2014   Procedure: HARDWARE REMOVAL;  Surgeon: Ninetta Lights, MD;  Location: [facility];  Service: Orthopedics;  Laterality: Left;  . ROTATOR CUFF REPAIR Right 2005 (approx)  . TOTAL HIP ARTHROPLASTY Left 07/21/2014   Procedure: TOTAL HIP ARTHROPLASTY ANTERIOR APPROACH;  Surgeon: Ninetta Lights, MD;  Location: [facility];  Service: Orthopedics;  Laterality: Left;  . TOTAL HIP ARTHROPLASTY Right 2006 (approx)   right hip replaced   Family History    Problem Relation Age of Onset  . Breast cancer Mother   . Cancer Mother        small intestine  . Liver cancer Mother   . Diabetes Mother   . Diabetes Father   . Diabetes Brother   . Hypertension Maternal Grandmother   . Heart Problems Maternal Grandmother   . Diabetes Paternal Grandmother   . Diabetes Paternal Grandfather   . Diabetes Brother    Social History   Tobacco Use  . Smoking status: Never Smoker  . Smokeless tobacco: Never Used  Vaping Use  . Vaping Use: Never used  Substance Use Topics  . Alcohol use: Yes    Alcohol/week: 0.0 standard drinks    Comment: beer and mixed drink maybe 1 - 2 times a month  . Drug use: No   Current Outpatient Medications  Medication Sig Dispense Refill  . acetaminophen (TYLENOL) 325 MG tablet Take 650 mg by mouth as needed (pain).     . Blood Glucose Monitoring Suppl (ACCU-CHEK GUIDE ME) w/Device KIT 1 each by Does not apply route daily. 1 kit 0  . empagliflozin (JARDIANCE) 25 MG TABS tablet Take 1 tablet (25 mg total) by mouth daily. 90 tablet 3  . gabapentin (NEURONTIN) 300 MG capsule Take 1 capsule (300 mg total) by mouth 4 (four) times daily as needed. 360 capsule 0  . glucose blood (ACCU-CHEK GUIDE) test strip Use as instructed to check blood sugar three times daily. DX:E11.65 300 each 2  . insulin aspart (NOVOLOG) 100 UNIT/ML injection Max of 56 units daily in V-Go pump    . Insulin Disposable Pump (V-GO 20) KIT APPLY V-GO 20 PUMP TO ARM ONE TIME DAILY AS DIRECTED 3 kit 0  . metFORMIN (GLUCOPHAGE-XR) 750 MG 24 hr tablet Take 2 tablets (1,500 mg total) by mouth daily. 180 tablet 0  . pravastatin (PRAVACHOL) 80 MG tablet Take 1 tablet by mouth once daily 90 tablet 0  . Accu-Chek FastClix Lancets MISC Use Accu Chek Fastclix lancets to check blood sugar three times daily. DX:E11.65 300 each 2  . diclofenac Sodium (VOLTAREN) 1 % GEL Apply 2 g topically 4 (four) times daily. (Patient not taking: Reported on 05/09/2020) 2 g 1  .  fluticasone (FLONASE) 50 MCG/ACT nasal spray Place 2 sprays into both nostrils daily. (Patient not taking: Reported on 05/09/2020) 15.8 mL 1   No current facility-administered medications for this visit.   No Known Allergies   Review of Systems: Positive for allergy trouble, back / hip pain, excessive thirst, excessive urination.  All other systems reviewed and negative except where noted in HPI.   PHYSICAL EXAM :  Wt Readings from Last 3 Encounters:  05/09/20 204 lb 4 oz (92.6 kg)  04/20/20 202 lb 9.6 oz (91.9 kg)  03/10/20 (!) 205 lb 6.4 oz (93.2 kg)    Ht 5' 9.25" (1.759 m) Comment: height measured without shoes  Wt 204 lb 4 oz (92.6 kg)   BMI 29.95 kg/m  Constitutional:  Gregory May in no acute distress. Psychiatric: Normal mood and affect. Behavior is normal. EENT: Pupils normal.  Conjunctivae are normal. No scleral icterus. Neck supple.  Cardiovascular: Normal rate, regular rhythm. No edema Pulmonary/chest: Effort normal and breath sounds normal. No wheezing, rales or rhonchi. Abdominal: Soft, nondistended, nontender. Bowel sounds active throughout. There are no masses palpable. No hepatomegaly. Musculoskeletal:  Right BKA Neurological: Alert and oriented to person place and time. Skin: Skin is warm and dry. No rashes noted.  Paula Guenther, NP  05/09/2020, 10:34 AM  Cc:  Referring Provider Watson, Julia, MD        

## 2020-05-09 NOTE — Progress Notes (Signed)
Reviewed and agree with management plan.  Adeel Guiffre T. Kebron Pulse, MD FACG [facility]  

## 2020-05-12 ENCOUNTER — Telehealth: Payer: Self-pay

## 2020-05-12 NOTE — Telephone Encounter (Signed)
New message   Checking the status of insulin aspart (NOVOLOG) 100 UNIT/ML injection from patient assistance.

## 2020-05-13 LAB — ANA: Anti Nuclear Antibody (ANA): NEGATIVE

## 2020-05-13 LAB — ANTI-SMOOTH MUSCLE ANTIBODY, IGG: Actin (Smooth Muscle) Antibody (IGG): <20 U (ref <20)

## 2020-05-13 LAB — IRON, TOTAL/TOTAL IRON BINDING CAP
%SAT: 17 % (calc) — ABNORMAL LOW (ref 20–48)
Iron: 57 ug/dL (ref 50–180)
TIBC: 336 mcg/dL (calc) (ref 250–425)

## 2020-05-13 LAB — ALPHA-1-ANTITRYPSIN: A-1 Antitrypsin, Ser: 156 mg/dL (ref 83–199)

## 2020-05-13 LAB — CERULOPLASMIN: Ceruloplasmin: 31 mg/dL (ref 18–36)

## 2020-05-13 LAB — MITOCHONDRIAL ANTIBODIES: Mitochondrial M2 Ab, IgG: <=20 U

## 2020-05-13 LAB — TISSUE TRANSGLUTAMINASE, IGA: (tTG) Ab, IgA: <1 U/mL

## 2020-05-13 NOTE — Telephone Encounter (Signed)
F/u    The patient is asking has the Novolog come in yet. If not a recommendation for medication over the counter.    Or can he get a sample if possible.

## 2020-05-17 NOTE — Telephone Encounter (Signed)
Left a vm for patient to callback to see if he has receive medication from [facility] yet.

## 2020-05-17 NOTE — Telephone Encounter (Signed)
Patient notified that we still have not received the sample and once we do we will send it

## 2020-05-18 ENCOUNTER — Ambulatory Visit (INDEPENDENT_AMBULATORY_CARE_PROVIDER_SITE_OTHER): Payer: Medicare HMO | Admitting: Physician Assistant

## 2020-05-18 ENCOUNTER — Encounter: Payer: Self-pay | Admitting: Physician Assistant

## 2020-05-18 VITALS — Ht 69.0 in | Wt 204.0 lb

## 2020-05-18 DIAGNOSIS — B351 Tinea unguium: Secondary | ICD-10-CM

## 2020-05-18 NOTE — Progress Notes (Signed)
Office Visit Note   Patient: Gregory May           Date of Birth: 1961/07/01           MRN: [id number] Visit Date: 05/18/2020              Requested by: Alphonzo Severance, MD [address] [place],  [place] [address] PCP: Alphonzo Severance, MD  Chief Complaint  Patient presents with  . Left Foot - Follow-up    Nail trim       HPI: Patient is a pleasant 59 year old gentleman who is status post right transtibial amputation.  He is doing extremely well.  He has no complaints he gets some occasional pain over a bony prominence on the lateral side but this is not caused him much difficulty.  He is also here for a trim of his left toenails  Assessment & Plan: Visit Diagnoses: No diagnosis found.  Plan: Follow-up 3 months.  Sooner if he is having any difficulties.  I encouraged him to go back to [facility] if he has any pressure points on his prosthetic.  Also reiterated the importance of stretching his Achilles to offload forefoot pressure  Follow-Up Instructions: No follow-ups on file.   Ortho Exam  Patient is alert, oriented, no adenopathy, well-dressed, normal affect, normal respiratory effort. Right below-knee amputation stump is well-healed no skin irritation no open ulcers he has no swelling no cellulitis Left foot onychomycotic nails x4 no cellulitis no signs of infection well-healed ray amputation onychomycotic nails trimmed x4  Imaging: No results found. No images are attached to the encounter.  Labs: Lab Results  Component Value Date   HGBA1C 7.1 (H) 03/07/2020   HGBA1C 7.2 (H) 12/03/2019   HGBA1C 6.8 (H) 09/07/2019   ESRSEDRATE 8 07/15/2019   ESRSEDRATE 56 (H) 06/01/2015   CRP 48 (H) 07/15/2019   REPTSTATUS 07/29/2019 FINAL 07/24/2019   GRAMSTAIN  07/24/2019    RARE WBC PRESENT, PREDOMINANTLY MONONUCLEAR NO ORGANISMS SEEN    CULT  07/24/2019    RARE STAPHYLOCOCCUS CAPRAE NO ANAEROBES ISOLATED Performed at [facility], [address] [place], [place]  [address]    [facility] STAPHYLOCOCCUS CAPRAE 07/24/2019     Lab Results  Component Value Date   ALBUMIN 4.2 05/09/2020   ALBUMIN 4.5 04/20/2020   ALBUMIN 4.1 03/07/2020    No results found for: MG Lab Results  Component Value Date   VD25OH 16 (L) 11/05/2013    No results found for: PREALBUMIN CBC EXTENDED Latest Ref Rng & Units 07/15/2019 06/07/2015 06/07/2015  WBC 3.4 - 10.8 x10E3/uL 11.8(H) 13.2(H) 12.2(H)  RBC 4.14 - 5.80 x10E6/uL 5.23 4.57 4.51  HGB 13.0 - 17.7 g/dL 63.0 16.0 10.9  HCT 32.3 - 51.0 % 47.4 41.7 41.3  PLT 150 - 450 x10E3/uL 383 435(H) 409(H)  NEUTROABS 1 - 7 x10E3/uL 8.2(H) 8.9(H) -  LYMPHSABS 0 - 3 x10E3/uL 2.2 2.5 -     Body mass index is 30.13 kg/m.  Orders:  No orders of the defined types were placed in this encounter.  No orders of the defined types were placed in this encounter.    Procedures: No procedures performed  Clinical Data: No additional findings.  ROS:  All other systems negative, except as noted in the HPI. Review of Systems  Objective: Vital Signs: Ht 5\' 9"  (1.753 m)   Wt 204 lb (92.5 kg)   BMI 30.13 kg/m   Specialty Comments:  No specialty comments available.  PMFS  History: Patient Active Problem List   Diagnosis Date Noted  . Back pain 04/20/2020  . Flu vaccine need 04/20/2020  . COVID-19 virus vaccination declined 04/20/2020  . Left knee pain 04/20/2020  . Allergic sinusitis 04/20/2020  . Elevated liver enzymes 04/20/2020  . Diastasis of rectus abdominis 07/16/2019  . 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 Essential hypertension 06/19/2018  . Peripheral neuropathy 12/19/2017  . Acquired absence of right leg below knee (HCC) 04/17/2017  . Carpal tunnel syndrome 03/18/2017  . Colon cancer screening 06/21/2015  . Status post below knee amputation of right lower extremity (HCC) 06/07/2015  . 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  Diabetic polyneuropathy associated with type 2 diabetes mellitus (HCC) 12/09/2014  . Diabetes mellitus with carpal tunnel syndrome (HCC) 12/09/2014  . DJD (degenerative joint disease) of knee 07/21/2014  . Osteoarthritis, hip, bilateral 05/25/2014  . Onychomycosis 10/14/2013  . Pure hypercholesterolemia 09/25/2013  . Diabetic foot ulcer (HCC) 09/15/2013  . Poorly controlled type 2 diabetes mellitus with complication (HCC) 09/15/1997   Past Medical History:  Diagnosis Date  . Arthritis    bilateral hips  . Cutaneous abscess of left foot   . Deep vein thrombosis (DVT) (HCC)   . Diabetes mellitus    type II  . 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 DJD (degenerative joint disease)   . DVT (deep venous thrombosis) (HCC) 02/08/2014   Proximal provoked. Date of diagnosis February 08 2014 Duration of anticoagulation: 6 months. End date 08/12/2014.  Anticoagulant: Lovenox 120 units daily Switched to Eliquis on 05/25/2014     . 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 Non-pressure chronic ulcer of right calf, limited to breakdown of skin (HCC) 04/17/2017  . Pulmonary emboli (HCC) 02/08/2014   Date of diagnosis February 08 2014, on chest CTA Hospitalized for 3 days Had some symptoms of shortness of breath, and chest pain With intercurrent DVT of the left LE. Duration of anticoagulation: 8 months. End date 10/11/2014.  Anticoagulant: Lovenox 120 units daily Switched to Eliquis on 05/25/2014 per patient preference   . Pulmonary embolism (HCC)   . Sebaceous cyst    on back of neck    Family History  Problem Relation Age of Onset  . Breast cancer Mother   . Cancer Mother        small intestine  . Liver cancer Mother   . Diabetes Mother   . Diabetes Father   . Diabetes Brother   . Hypertension Maternal Grandmother   . Heart Problems Maternal Grandmother   . Diabetes Paternal Grandmother   . Diabetes Paternal Grandfather   . Diabetes Brother     Past Surgical History:  Procedure Laterality  Date  . AMPUTATION Right 06/03/2015   Procedure: Right Below Knee Amputation;  Surgeon: Nadara Mustard, MD;  Location: [facility] OR;  Service: Orthopedics;  Laterality: Right;  . AMPUTATION Left 07/24/2019   Procedure: LEFT FOOT 3RD RAY AMPUTATION;  Surgeon: Nadara Mustard, MD;  Location: [facility];  Service: Orthopedics;  Laterality: Left;  . CLOSED REDUCTION WITH HUMER PIN INSERTION  1974   left hip  . HARDWARE REMOVAL Left 07/21/2014   Procedure: HARDWARE REMOVAL;  Surgeon: Loreta Ave, MD;  Location: [facility] OR;  Service: Orthopedics;  Laterality: Left;  . ROTATOR CUFF REPAIR Right 2005 (approx)  . TOTAL HIP ARTHROPLASTY Left 07/21/2014   Procedure: TOTAL HIP ARTHROPLASTY ANTERIOR  APPROACH;  Surgeon: Loreta Ave, MD;  Location: [facility] OR;  Service: Orthopedics;  Laterality: Left;  . TOTAL HIP ARTHROPLASTY Right 2006 (approx)   right hip replaced   Social History   Occupational History  . Occupation: disabled  Tobacco Use  . Smoking status: Never Smoker  . Smokeless tobacco: Never Used  Vaping Use  . Vaping Use: Never used  Substance and Sexual Activity  . Alcohol use: Yes    Alcohol/week: 0.0 standard drinks    Comment: beer and mixed drink maybe 5  times a month  . Drug use: No  . Sexual activity: Never

## 2020-05-18 NOTE — Telephone Encounter (Signed)
F/u    Checking on the status of insulin aspart (NOVOLOG) 100 UNIT/ML injection. To see if it has come in from the patient assistance program.

## 2020-05-19 ENCOUNTER — Telehealth: Payer: Self-pay | Admitting: Nurse Practitioner

## 2020-05-19 NOTE — Telephone Encounter (Signed)
Pt received your letter and has some questions. Pls call him at [phone].

## 2020-05-20 ENCOUNTER — Other Ambulatory Visit: Payer: Self-pay | Admitting: Endocrinology

## 2020-05-23 NOTE — Telephone Encounter (Signed)
FU  The patient is aware of previous message from CMA today.   Was told by Tobi Bastos to call back on Monday to discuss medication tracking.   Asking for call back to see if the office has any samples

## 2020-05-23 NOTE — Telephone Encounter (Signed)
Patient is advise per Dr.Kumar we are able to give him a sample of the Novolog vial out of the fridge

## 2020-05-23 NOTE — Telephone Encounter (Signed)
Called patient and answered some questions he had about his liver enzymes. He has an [place] 05/25/20

## 2020-05-23 NOTE — Telephone Encounter (Signed)
Patient's brother came and picked up one vial of Novolog sample.

## 2020-05-23 NOTE — Telephone Encounter (Signed)
If patient callbacks  please let him know that we have not received samples yet. We will contact him as soon as we receive them

## 2020-05-23 NOTE — Telephone Encounter (Signed)
F/u   The patient brother Taji Barretto will be by the office to pick up sample of Novolog for the patient.

## 2020-05-25 ENCOUNTER — Other Ambulatory Visit: Payer: Self-pay

## 2020-05-25 ENCOUNTER — Ambulatory Visit (HOSPITAL_COMMUNITY)
Admission: RE | Admit: 2020-05-25 | Discharge: 2020-05-25 | Disposition: A | Discharge status: Home / Self Care | Payer: Medicare HMO | Source: Ambulatory Visit | Attending: Internal Medicine | Admitting: Internal Medicine

## 2020-05-25 DIAGNOSIS — R748 Abnormal levels of other serum enzymes: Secondary | ICD-10-CM | POA: Diagnosis not present

## 2020-05-25 DIAGNOSIS — K760 Fatty (change of) liver, not elsewhere classified: Secondary | ICD-10-CM | POA: Diagnosis not present

## 2020-05-31 NOTE — Telephone Encounter (Signed)
Patient called checking status of his Novolog, he said when he called Novo, they informed him his medication was shipped to our office on September 22. I asked him if he could give them a call to see who signed for it - it could have been delivered to the wrong office.

## 2020-06-02 ENCOUNTER — Emergency Department (HOSPITAL_COMMUNITY): Admission: EM | Admit: 2020-06-02 | Payer: Medicare HMO | Source: Home / Self Care

## 2020-06-02 ENCOUNTER — Emergency Department (HOSPITAL_COMMUNITY): Payer: Medicare HMO

## 2020-06-02 DIAGNOSIS — R0902 Hypoxemia: Secondary | ICD-10-CM | POA: Diagnosis not present

## 2020-06-02 DIAGNOSIS — T50904A Poisoning by unspecified drugs, medicaments and biological substances, undetermined, initial encounter: Secondary | ICD-10-CM | POA: Diagnosis not present

## 2020-06-02 DIAGNOSIS — R0689 Other abnormalities of breathing: Secondary | ICD-10-CM | POA: Diagnosis not present

## 2020-06-02 DIAGNOSIS — R404 Transient alteration of awareness: Secondary | ICD-10-CM | POA: Diagnosis not present

## 2020-06-02 DIAGNOSIS — T887XXA Unspecified adverse effect of drug or medicament, initial encounter: Secondary | ICD-10-CM | POA: Diagnosis not present

## 2020-06-02 LAB — CBC WITH DIFFERENTIAL/PLATELET
Abs Immature Granulocytes: 0.02 10*3/uL (ref 0.00–0.07)
Basophils Absolute: 0 10*3/uL (ref 0.0–0.1)
Basophils Relative: 1 %
Eosinophils Absolute: 0.3 10*3/uL (ref 0.0–0.5)
Eosinophils Relative: 3 %
HCT: 47.2 % (ref 39.0–52.0)
Hemoglobin: 15 g/dL (ref 13.0–17.0)
Immature Granulocytes: 0 %
Lymphocytes Relative: 23 %
Lymphs Abs: 1.9 10*3/uL (ref 0.7–4.0)
MCH: 27.4 pg (ref 26.0–34.0)
MCHC: 31.8 g/dL (ref 30.0–36.0)
MCV: 86.1 fL (ref 80.0–100.0)
Monocytes Absolute: 0.7 10*3/uL (ref 0.1–1.0)
Monocytes Relative: 8 %
Neutro Abs: 5.6 10*3/uL (ref 1.7–7.7)
Neutrophils Relative %: 65 %
Platelets: 357 10*3/uL (ref 150–400)
RBC: 5.48 MIL/uL (ref 4.22–5.81)
RDW: 14.9 % (ref 11.5–15.5)
WBC: 8.5 10*3/uL (ref 4.0–10.5)
nRBC: 0 % (ref 0.0–0.2)

## 2020-06-02 LAB — BASIC METABOLIC PANEL
Anion gap: 11 (ref 5–15)
BUN: 21 mg/dL — ABNORMAL HIGH (ref 6–20)
CO2: 25 mmol/L (ref 22–32)
Calcium: 9.1 mg/dL (ref 8.9–10.3)
Chloride: 104 mmol/L (ref 98–111)
Creatinine, Ser: 1 mg/dL (ref 0.61–1.24)
GFR, Estimated: >60 mL/min (ref >60)
Glucose, Bld: 173 mg/dL — ABNORMAL HIGH (ref 70–99)
Potassium: 3.1 mmol/L — ABNORMAL LOW (ref 3.5–5.1)
Sodium: 140 mmol/L (ref 135–145)

## 2020-06-02 LAB — ETHANOL: Alcohol, Ethyl (B): <10 mg/dL (ref <10)

## 2020-06-02 NOTE — ED Triage Notes (Signed)
Pt arrives to ED BIB [facility] due to a Drug Overdose. Per EMS pt was found by unresponsive at a [employer]. Per EMS two rounds of CPR was done by [employer]. Per EMS [employer] Dept administered 1 of Narcan IN and assisted with ventilations and 0.5 of IV narcan was administered by EMS. EDP at bedside upon pt's arrival.  BP 150/90 HR 90  92% RA

## 2020-06-03 ENCOUNTER — Telehealth: Payer: Self-pay

## 2020-06-03 NOTE — Telephone Encounter (Signed)
Called patient regarding [place] results showing hepatic steatosis. Advised he follow up with Dr. Claudette Laws for repeat labs at that time.

## 2020-06-03 NOTE — Telephone Encounter (Signed)
Pt contact pt regarding results [phone]

## 2020-06-03 NOTE — Telephone Encounter (Signed)
Patient has labs yesterday and abd [place] on 05/25/2020. Kinnie Feil, BSN, RN-BC

## 2020-06-16 ENCOUNTER — Other Ambulatory Visit: Payer: Self-pay | Admitting: Endocrinology

## 2020-06-21 ENCOUNTER — Other Ambulatory Visit (INDEPENDENT_AMBULATORY_CARE_PROVIDER_SITE_OTHER): Payer: Medicare HMO

## 2020-06-21 ENCOUNTER — Other Ambulatory Visit: Payer: Self-pay

## 2020-06-21 DIAGNOSIS — E1165 Type 2 diabetes mellitus with hyperglycemia: Secondary | ICD-10-CM

## 2020-06-21 DIAGNOSIS — Z794 Long term (current) use of insulin: Secondary | ICD-10-CM | POA: Diagnosis not present

## 2020-06-21 DIAGNOSIS — R748 Abnormal levels of other serum enzymes: Secondary | ICD-10-CM

## 2020-06-21 DIAGNOSIS — E7800 Pure hypercholesterolemia, unspecified: Secondary | ICD-10-CM

## 2020-06-21 LAB — COMPREHENSIVE METABOLIC PANEL
ALT: 61 U/L — ABNORMAL HIGH (ref 0–53)
AST: 42 U/L — ABNORMAL HIGH (ref 0–37)
Albumin: 4.1 g/dL (ref 3.5–5.2)
Alkaline Phosphatase: 59 U/L (ref 39–117)
BUN: 14 mg/dL (ref 6–23)
CO2: 31 mEq/L (ref 19–32)
Calcium: 9 mg/dL (ref 8.4–10.5)
Chloride: 98 mEq/L (ref 96–112)
Creatinine, Ser: 1 mg/dL (ref 0.40–1.50)
GFR: 83.43 mL/min (ref >60.00)
Glucose, Bld: 155 mg/dL — ABNORMAL HIGH (ref 70–99)
Potassium: 3.7 mEq/L (ref 3.5–5.1)
Sodium: 137 mEq/L (ref 135–145)
Total Bilirubin: 0.3 mg/dL (ref 0.2–1.2)
Total Protein: 7.1 g/dL (ref 6.0–8.3)

## 2020-06-21 LAB — LIPID PANEL
Cholesterol: 151 mg/dL (ref 0–200)
HDL: 43.1 mg/dL (ref >39.00)
LDL Cholesterol: 78 mg/dL (ref 0–99)
NonHDL: 107.62
Total CHOL/HDL Ratio: 3
Triglycerides: 147 mg/dL (ref 0.0–149.0)
VLDL: 29.4 mg/dL (ref 0.0–40.0)

## 2020-06-21 LAB — HEMOGLOBIN A1C: Hgb A1c MFr Bld: 7.6 % — ABNORMAL HIGH (ref 4.6–6.5)

## 2020-06-23 ENCOUNTER — Encounter: Payer: Self-pay | Admitting: Endocrinology

## 2020-06-23 ENCOUNTER — Other Ambulatory Visit: Payer: Self-pay

## 2020-06-23 ENCOUNTER — Ambulatory Visit (INDEPENDENT_AMBULATORY_CARE_PROVIDER_SITE_OTHER): Payer: Medicare HMO | Admitting: Endocrinology

## 2020-06-23 VITALS — BP 104/70 | HR 75 | Ht 69.0 in | Wt 201.4 lb

## 2020-06-23 DIAGNOSIS — Z794 Long term (current) use of insulin: Secondary | ICD-10-CM

## 2020-06-23 DIAGNOSIS — K760 Fatty (change of) liver, not elsewhere classified: Secondary | ICD-10-CM

## 2020-06-23 DIAGNOSIS — E1165 Type 2 diabetes mellitus with hyperglycemia: Secondary | ICD-10-CM | POA: Diagnosis not present

## 2020-06-23 DIAGNOSIS — Z23 Encounter for immunization: Secondary | ICD-10-CM

## 2020-06-23 DIAGNOSIS — E782 Mixed hyperlipidemia: Secondary | ICD-10-CM

## 2020-06-23 MED ORDER — PIOGLITAZONE HCL 15 MG PO TABS: 15.0000 mg | ORAL_TABLET | Freq: Every day | ORAL | 2 refills | Status: DC

## 2020-06-23 NOTE — Patient Instructions (Signed)
Check blood sugars on waking up 2-3 days a week  Also check blood sugars about 2 hours after meals and do this after different meals by rotation  Recommended blood sugar levels on waking up are 90-130 and about 2 hours after meal is 130-180  Please bring your blood sugar monitor to each visit, thank you   

## 2020-06-23 NOTE — Progress Notes (Signed)
Gregory May 59 y.o.           Reason for Appointment: follow-up   History of Present Illness   Diagnosis: Type 2 DIABETES MELITUS, date of diagnosis:  1999     Previous history: He has previously been treated with metformin and Victoza and subsequently mealtime insulin added to control postprandial hyperglycemia Overall he has been  relatively noncompliant with his diet, medications, monitoring and followup Previously would not take Victoza regularly because of cost. Has not been taking any medications for the last year and a half because of commitments to family and cost A1c had increased to 12.3% and hyperglycemia discovered when he was hospitalized for foot ulcer. Also lost 20 pounds because of hyperglycemia   For better control, compliance and inconvenience he was switched to the V-go pump on 02/21/16  Recent history:   Insulin regimen: V-go 20 unit basal with Novolog insulin, mealtime boluses 12-16 breakfast --- 12 -20 units before dinner  Non-insulin hypoglycemic drugs: Metformin ER 1500 mg daily    Jardiance 71m daily    A1c  is slightly higher at 7.6 compared to 7.1    Current diabetes management, blood sugar patterns and problems identified.   He feels that his blood sugars may be higher because of poor diet and eating sweets and snacks at night  As before he does not check his sugars much after meals or at bedtime  He cannot bring his monitor also for download 90  Surprisingly has lost 3 pounds  Also in the last week he is not using the V-go pump because of cost but he still thinks his blood sugars are not much higher in the morning  Otherwise stable with the V-go pump have mildly increased blood sugars  Lab glucose was 150 5 in the afternoon not fasting  Renal function stable with 25 mg Jardiance  As before he has limited activity level although he was recently busy working at the [employer]       Side effects from medications: None        Glucometer:  Accu-Chek guide          Blood Glucose readings by recall   PRE-MEAL Fasting Lunch Dinner Bedtime Overall  Glucose range: 155  90 ?   Mean/median:       Previous readings:  PRE-MEAL Fasting Lunch Dinner Bedtime Overall  Glucose range:  125-158  99  128-157  87-162   Mean/median: 138   132 136   POST-MEAL PC Breakfast PC Lunch PC Dinner  Glucose range:  92    Mean/median:        Meals:  usually 2 meals per day usually.  breakfast at 12 noon, evening meal 7-9 pm, variable snacks late at night    Physical activity: exercise: Minimal               Weight control:   Wt Readings from Last 3 Encounters:  06/23/20 201 lb 6.4 oz (91.4 kg)  05/18/20 204 lb (92.5 kg)  05/09/20 204 lb 4 oz (92.6 kg)          Diabetes labs:  Lab Results  Component Value Date   HGBA1C 7.6 (H) 06/21/2020   HGBA1C 7.1 (H) 03/07/2020   HGBA1C 7.2 (H) 12/03/2019   Lab Results  Component Value Date   MICROALBUR 5.0 (H) 12/03/2019   LDLCALC 78 06/21/2020   CREATININE 1.00 06/21/2020    Lab Results  Component Value Date   FRUCTOSAMINE 272  03/03/2019   FRUCTOSAMINE 243 03/07/2017   FRUCTOSAMINE 274 10/30/2016     Other active problems: See review of systems    Lab on 06/21/2020  Component Date Value Ref Range Status  . Cholesterol 06/21/2020 151  0 - 200 mg/dL Final   ATP III Classification       Desirable:  < 200 mg/dL               Borderline High:  200 - 239 mg/dL          High:  > = 240 mg/dL  . Triglycerides 06/21/2020 147.0  0 - 149 mg/dL Final   Normal:  <150 mg/dLBorderline High:  150 - 199 mg/dL  . HDL 06/21/2020 43.10  >39.00 mg/dL Final  . VLDL 06/21/2020 29.4  0.0 - 40.0 mg/dL Final  . LDL Cholesterol 06/21/2020 78  0 - 99 mg/dL Final  . Total CHOL/HDL Ratio 06/21/2020 3   Final                  Men          Women1/2 Average Risk     3.4          3.3Average Risk          5.0          4.42X Average Risk          9.6          7.13X Average Risk          15.0           11.0                      . NonHDL 06/21/2020 107.62   Final   NOTE:  Non-HDL goal should be 30 mg/dL higher than patient's LDL goal (i.e. LDL goal of < 70 mg/dL, would have non-HDL goal of < 100 mg/dL)  . Sodium 06/21/2020 137  135 - 145 mEq/L Final  . Potassium 06/21/2020 3.7  3.5 - 5.1 mEq/L Final  . Chloride 06/21/2020 98  96 - 112 mEq/L Final  . CO2 06/21/2020 31  19 - 32 mEq/L Final  . Glucose, Bld 06/21/2020 155* 70 - 99 mg/dL Final  . BUN 06/21/2020 14  6 - 23 mg/dL Final  . Creatinine, Ser 06/21/2020 1.00  0.40 - 1.50 mg/dL Final  . Total Bilirubin 06/21/2020 0.3  0.2 - 1.2 mg/dL Final  . Alkaline Phosphatase 06/21/2020 59  39 - 117 U/L Final  . AST 06/21/2020 42* 0 - 37 U/L Final  . ALT 06/21/2020 61* 0 - 53 U/L Final  . Total Protein 06/21/2020 7.1  6.0 - 8.3 g/dL Final  . Albumin 06/21/2020 4.1  3.5 - 5.2 g/dL Final  . GFR 06/21/2020 83.43  >60.00 mL/min Final   Calculated using the CKD-EPI Creatinine Equation (2021)  . Calcium 06/21/2020 9.0  8.4 - 10.5 mg/dL Final  . Hgb A1c MFr Bld 06/21/2020 7.6* 4.6 - 6.5 % Final   Glycemic Control Guidelines for People with Diabetes:Non Diabetic:  <6%Goal of Therapy: <7%Additional Action Suggested:  >8%      Allergies as of 06/23/2020   No Known Allergies     Medication List       Accurate as of June 23, 2020  3:23 PM. If you have any questions, ask your nurse or doctor.        Accu-Chek FastClix Lancets Misc Use Accu Chek Fastclix lancets to  check blood sugar three times daily. DX:E11.65   Accu-Chek Guide Me w/Device Kit 1 each by Does not apply route daily.   Accu-Chek Guide test strip Generic drug: glucose blood Use as instructed to check blood sugar three times daily. DX:E11.65   acetaminophen 325 MG tablet Commonly known as: TYLENOL Take 650 mg by mouth as needed (pain).   diclofenac Sodium 1 % Gel Commonly known as: Voltaren Apply 2 g topically 4 (four) times daily.   empagliflozin 25 MG Tabs  tablet Commonly known as: Jardiance Take 1 tablet (25 mg total) by mouth daily.   fluticasone 50 MCG/ACT nasal spray Commonly known as: Flonase Place 2 sprays into both nostrils daily.   gabapentin 300 MG capsule Commonly known as: NEURONTIN TAKE 1 CAPSULE FOUR TIMES DAILY AS NEEDED   insulin aspart 100 UNIT/ML injection Commonly known as: novoLOG Max of 56 units daily in V-Go pump   metFORMIN 750 MG 24 hr tablet Commonly known as: GLUCOPHAGE-XR Take 2 tablets (1,500 mg total) by mouth daily.   pravastatin 80 MG tablet Commonly known as: PRAVACHOL Take 1 tablet by mouth once daily   V-Go 20 Kit APPLY V-GO 20 PUMP TO ARM ONE TIME DAILY AS DIRECTED       Allergies: No Known Allergies  Past Medical History:  Diagnosis Date  . Arthritis    bilateral hips  . Cutaneous abscess of left foot   . Deep vein thrombosis (DVT) ([place])   . Diabetes mellitus    type II  . 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 DJD (degenerative joint disease)   . DVT (deep venous thrombosis) ([place]) 02/08/2014   Proximal provoked. Date of diagnosis February 08 2014 Duration of anticoagulation: 6 months. End date 08/12/2014.  Anticoagulant: Lovenox 120 units daily Switched to Eliquis on 05/25/2014     . 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 Non-pressure chronic ulcer of right calf, limited to breakdown of skin ([place]) 04/17/2017  . Pulmonary emboli ([place]) 02/08/2014   Date of diagnosis February 08 2014, on chest CTA Hospitalized for 3 days Had some symptoms of shortness of breath, and chest pain With intercurrent DVT of the left LE. Duration of anticoagulation: 8 months. End date 10/11/2014.  Anticoagulant: Lovenox 120 units daily Switched to Eliquis on 05/25/2014 per patient preference   . Pulmonary embolism ([place])   . Sebaceous cyst    on back of neck    Past Surgical History:  Procedure Laterality Date  . AMPUTATION Right 06/03/2015   Procedure: Right Below Knee Amputation;  Surgeon: [name],  MD;  Location: [facility];  Service: Orthopedics;  Laterality: Right;  . AMPUTATION Left 07/24/2019   Procedure: LEFT FOOT 3RD RAY AMPUTATION;  Surgeon: [name], MD;  Location: [facility];  Service: Orthopedics;  Laterality: Left;  . CLOSED REDUCTION WITH HUMER PIN INSERTION  1974   left hip  . HARDWARE REMOVAL Left 07/21/2014   Procedure: HARDWARE REMOVAL;  Surgeon: Ninetta Lights, MD;  Location: [facility];  Service: Orthopedics;  Laterality: Left;  . ROTATOR CUFF REPAIR Right 2005 (approx)  . TOTAL HIP ARTHROPLASTY Left 07/21/2014   Procedure: TOTAL HIP ARTHROPLASTY ANTERIOR APPROACH;  Surgeon: Ninetta Lights, MD;  Location: [facility];  Service: Orthopedics;  Laterality: Left;  . TOTAL HIP ARTHROPLASTY Right 2006 (approx)   right hip replaced    Family History  Problem Relation Age of Onset  . Breast cancer Mother   . Cancer  Mother        small intestine  . Liver cancer Mother   . Diabetes Mother   . Diabetes Father   . Diabetes Brother   . Hypertension Maternal Grandmother   . Heart Problems Maternal Grandmother   . Diabetes Paternal Grandmother   . Diabetes Paternal Grandfather   . Diabetes Brother     Social History:  reports that he has never smoked. He has never used smokeless tobacco. He reports current alcohol use. He reports that he does not use drugs.  Review of Systems:   Lipids: He is on Pravachol  with variable control  LDL is now improving, likely from better compliance with pravastatin   Lab Results  Component Value Date   CHOL 151 06/21/2020   CHOL 210 (H) 03/07/2020   CHOL 174 12/03/2019   Lab Results  Component Value Date   HDL 43.10 06/21/2020   HDL 43.80 03/07/2020   HDL 49.10 12/03/2019   Lab Results  Component Value Date   LDLCALC 78 06/21/2020   LDLCALC 145 (H) 03/07/2020   LDLCALC 106 (H) 12/03/2019   Lab Results  Component Value Date   TRIG 147.0 06/21/2020   TRIG 107.0 03/07/2020   TRIG 90.0 12/03/2019   Lab Results  Component Value  Date   CHOLHDL 3 06/21/2020   CHOLHDL 5 03/07/2020   CHOLHDL 4 12/03/2019   Lab Results  Component Value Date   LDLDIRECT 91.0 07/11/2015    Has history of abnormal liver functions, these are consistently higher Previously had not improved with stopping pravastatin  Recently has slight improvement in ALT levels Has been told by her PCP and gastroenterologist to have fatty liver which was seen on his ultrasound  Lab Results  Component Value Date   ALT 61 (H) 06/21/2020   ALT 69 (H) 05/09/2020   ALT 80 (H) 04/20/2020   ALT 82 (H) 03/07/2020   ALT 84 (H) 12/03/2019     HYPERTENSION: Currently not requiring medications Controlled with Jardiance Blood pressure is high normal occasionally  Renal function normal Microalbumin normal as of 4/21   BP Readings from Last 3 Encounters:  06/23/20 104/70  05/09/20 110/80  04/20/20 (!) 146/88   Lab Results  Component Value Date   CREATININE 1.00 06/21/2020   CREATININE 1.00 06/02/2020   CREATININE 0.81 04/20/2020   NEUROPATHY:  He has had some burning in the left foot along with discomfort, paresthesia, some numbness and difficulty using his right hand Treated with gabapentin     Examination:   BP 104/70   Pulse 75   Ht 5' 9"  (1.753 m)   Wt 201 lb 6.4 oz (91.4 kg)   SpO2 95%   BMI 29.74 kg/m   Body mass index is 29.74 kg/m.   ASSESSMENT/ PLAN:     Diabetes type 2 insulin-dependent:  See history of present illness for detailed discussion of current diabetes management, blood sugar patterns and problems identified  A1c is 7.6, previously 7.1  He is on a V-go pump along with metformin and Jardiance, now 25 mg  Did not bring his monitor for download and also not monitoring consistently especially after meals  His blood sugars are likely higher from inconsistent diet as well as recently not using the pump He is also on Jardiance and recently has been a little more active which has helped his weight  Discussed  blood sugar targets both before and after meals Needs to regularly check blood sugars at bedtime to help  adjust his mealtime doses and boluses for snacks He thinks he can try and cut back on sweets and snacks at night   LIPIDS: LDL is back down to good level with resuming pravastatin  Abnormal liver functions: Liver functions are consistently high from fatty liver With his not improving spontaneously will consider Actos to help with insulin resistance Discussed benefits and risks of taking Actos and since he has had no issues with edema or cardiac problems he should be able to benefit from this We will start with 15 mg and increase as needed   Follow-up in 2 months  Patient Instructions  Check blood sugars on waking up 2-3 days a week   Also check blood sugars about 2 hours after meals and do this after different meals by rotation  Recommended blood sugar levels on waking up are 90-130 and about 2 hours after meal is 130-180  Please bring your blood sugar monitor to each visit, thank you       Gregory May 06/23/2020, 3:23 PM   Note: This office note was prepared with Dragon voice recognition system technology. Any transcriptional errors that result from this process are unintentional.

## 2020-06-24 DIAGNOSIS — K760 Fatty (change of) liver, not elsewhere classified: Secondary | ICD-10-CM | POA: Insufficient documentation

## 2020-07-12 DIAGNOSIS — H5213 Myopia, bilateral: Secondary | ICD-10-CM | POA: Diagnosis not present

## 2020-07-12 DIAGNOSIS — E119 Type 2 diabetes mellitus without complications: Secondary | ICD-10-CM | POA: Diagnosis not present

## 2020-07-12 DIAGNOSIS — H2513 Age-related nuclear cataract, bilateral: Secondary | ICD-10-CM | POA: Diagnosis not present

## 2020-07-12 LAB — HM DIABETES EYE EXAM

## 2020-07-13 ENCOUNTER — Encounter: Payer: Self-pay | Admitting: *Deleted

## 2020-07-14 ENCOUNTER — Other Ambulatory Visit: Payer: Self-pay

## 2020-07-14 MED ORDER — EMPAGLIFLOZIN 25 MG PO TABS
25.0000 mg | ORAL_TABLET | Freq: Every day | ORAL | 3 refills | Status: DC
Start: 2020-07-14 — End: 2023-07-09

## 2020-07-14 NOTE — Telephone Encounter (Signed)
Pt states he need help with getting a refill on empagliflozin (JARDIANCE) 25 MG TABS tablet. Please call pt back.

## 2020-07-15 ENCOUNTER — Telehealth: Payer: Self-pay

## 2020-07-15 NOTE — Telephone Encounter (Signed)
Faxed 14 day free trial coupon to [facility] [employer] on [address].  Called pharmacy and verified that coupon was received, processed and $0 to patient.  Called and left a voicemail for patient that the 14 days is being dispensed to pt at $0 at [facility] on [address]

## 2020-07-19 ENCOUNTER — Other Ambulatory Visit: Payer: Self-pay | Admitting: Endocrinology

## 2020-07-25 ENCOUNTER — Other Ambulatory Visit: Payer: Self-pay | Admitting: *Deleted

## 2020-07-25 MED ORDER — V-GO 20 KIT
PACK | 1 refills | Status: DC
Start: 2020-07-25 — End: 2021-05-29

## 2020-07-26 ENCOUNTER — Telehealth: Payer: Self-pay

## 2020-07-26 ENCOUNTER — Encounter: Payer: Self-pay | Admitting: *Deleted

## 2020-07-26 NOTE — Progress Notes (Signed)

## 2020-07-26 NOTE — Telephone Encounter (Signed)
Spoke to patient today regarding patient assistance program re-enrollment for 2022.  Patient states that he has the Novo application for his Novolog and requested that I mail him the [facility] application for his Jardiance.  I also requested a copy of his Social Security Disability Letter as proof of income and patient stated this would be provided.  Patient was instructed to complete the patient portion of the applications and return to Internal Medicine once completed.

## 2020-07-27 NOTE — Progress Notes (Signed)
COMPLETE

## 2020-07-27 NOTE — Progress Notes (Signed)
Things That May Be Affecting Your Health:  Alcohol  Hearing loss  Pain    Depression  Home Safety  Sexual Health  X Diabetes  Lack of physical activity  Stress  X Difficulty with daily activities  Loneliness  Tiredness   Drug use X Medicines  Tobacco use   Falls  Motor Vehicle Safety  Weight  X Food choices  Oral Health  Other    YOUR PERSONALIZED HEALTH PLAN : 1. Schedule your next subsequent Medicare Wellness visit in one year 2. Attend all of your regular appointments to address your medical issues 3. Complete the preventative screenings and services   Annual Wellness Visit   Medicare Covered Preventative Screenings and Services  Services & Screenings Men and Women Who How Often Need? Date of Last Service Action  Abdominal Aortic Aneurysm Adults with AAA risk factors Once X    Alcohol Misuse and Counseling All Adults Screening once a year if no alcohol misuse. Counseling up to 4 face to face sessions.     Bone Density Measurement  Adults at risk for osteoporosis Once every 2 yrs     Lipid Panel Z13.6 All adults without CV disease Once every 5 yrs     Colorectal Cancer   Stool sample or  Colonoscopy All adults 50 and older   Once every year  Every 10 years X    Depression All Adults Once a year X Today   Diabetes Screening Blood glucose, post glucose load, or GTT Z13.1  All adults at risk  Pre-diabetics  Once per year  Twice per year     Diabetes  Self-Management Training All adults Diabetics 10 hrs first year; 2 hours subsequent years. Requires Copay     Glaucoma  Diabetics  Family history of glaucoma  African Americans 50 yrs +  Hispanic Americans 65 yrs + Annually - requires coppay     Hepatitis C Z72.89 or F19.20  High Risk for HCV  Born between 1945 and 1965  Annually  Once     HIV Z11.4 All adults based on risk  Annually btw ages 61 & 72 regardless of risk  Annually > 65 yrs if at increased risk     Lung Cancer Screening Asymptomatic adults  aged 14-77 with 30 pack yr history and current smoker OR quit within the last 15 yrs Annually Must have counseling and shared decision making documentation before first screen     Medical Nutrition Therapy Adults with   Diabetes  Renal disease  Kidney transplant within past 3 yrs 3 hours first year; 2 hours subsequent years X    Obesity and Counseling All adults Screening once a year Counseling if BMI 30 or higher  Today   Tobacco Use Counseling Adults who use tobacco  Up to 8 visits in one year     Vaccines Z23  Hepatitis B  Influenza   Pneumonia  Adults   Once  Once every flu season  Two different vaccines separated by one year X    Next Annual Wellness Visit People with Medicare Every year  Today     Services & Screenings Women Who How Often Need  Date of Last Service Action  Mammogram  Z12.31 Women over 40 One baseline ages 80-39. Annually ager 40 yrs+     Pap tests All women Annually if high risk. Every 2 yrs for normal risk women     Screening for cervical cancer with   Pap (Z01.419 nl or Z01.411abnl) &  HPV Z11.51 Women aged 51 to 43 Once every 5 yrs     Screening pelvic and breast exams All women Annually if high risk. Every 2 yrs for normal risk women     Sexually Transmitted Diseases  Chlamydia  Gonorrhea  Syphilis All at risk adults Annually for non pregnant females at increased risk         [place] Men Who How Ofter Need  Date of Last Service Action  Prostate Cancer - DRE & PSA Men over 50 Annually.  DRE might require a copay.     Sexually Transmitted Diseases  Syphilis All at risk adults Annually for men at increased risk

## 2020-08-08 ENCOUNTER — Encounter: Payer: Self-pay | Admitting: *Deleted

## 2020-08-10 ENCOUNTER — Telehealth: Payer: Self-pay | Admitting: *Deleted

## 2020-08-10 NOTE — Telephone Encounter (Signed)
Agree with plan. Thank you

## 2020-08-10 NOTE — Telephone Encounter (Signed)
Pt calls c/o "cold" symptoms states the last time he saw his doctor she told him its not COVID so he thinks he just nees to be seen and get something for a cold. Pt states he has lo grade fevers on and off 98.7 to 99.2 He is ask to get tested for COVID because we cannot look at someone and tell they have it or dont have it. He is agreeable He is ask to go to pharmacy and speak to pharmacist about OTC meds for his symptoms as pharmacist can look at his med list and see what will be safe. If by Sunday 08/14/2020 he is still feeling unwell to go to urg care for eval/ do you agree?

## 2020-08-15 ENCOUNTER — Other Ambulatory Visit: Payer: Medicare HMO

## 2020-08-15 DIAGNOSIS — Z20822 Contact with and (suspected) exposure to covid-19: Secondary | ICD-10-CM | POA: Diagnosis not present

## 2020-08-18 ENCOUNTER — Ambulatory Visit: Payer: Medicare HMO | Admitting: Orthopedic Surgery

## 2020-08-18 ENCOUNTER — Ambulatory Visit: Payer: Medicare HMO | Admitting: Endocrinology

## 2020-08-25 ENCOUNTER — Ambulatory Visit (INDEPENDENT_AMBULATORY_CARE_PROVIDER_SITE_OTHER): Payer: Medicare HMO | Admitting: Orthopedic Surgery

## 2020-08-25 DIAGNOSIS — Z89511 Acquired absence of right leg below knee: Secondary | ICD-10-CM

## 2020-08-25 DIAGNOSIS — S88111A Complete traumatic amputation at level between knee and ankle, right lower leg, initial encounter: Secondary | ICD-10-CM

## 2020-08-25 DIAGNOSIS — Z89422 Acquired absence of other left toe(s): Secondary | ICD-10-CM

## 2020-08-25 DIAGNOSIS — B351 Tinea unguium: Secondary | ICD-10-CM

## 2020-08-26 ENCOUNTER — Encounter: Payer: Self-pay | Admitting: Orthopedic Surgery

## 2020-08-26 NOTE — Progress Notes (Signed)
Office Visit Note   Patient: Gregory May           Date of Birth: 08-19-1960           MRN: [id number] Visit Date: 08/25/2020              Requested by: Alphonzo Severance, MD [address] [place],  [place] [address] PCP: Alphonzo Severance, MD  Chief Complaint  Patient presents with  . Left Foot - Follow-up  . Right Leg - Follow-up      HPI: Patient is a 60 year old gentleman who is seen in follow-up for a right transtibial amputation as well as onychomycotic nails on the left foot.  Patient states he is developing ulceration over the patella from subsidence in his socket.  Patient complains of pain from the onychomycotic nails.  Assessment & Plan: Visit Diagnoses:  1. Onychomycosis   2. Below-knee amputation of right lower extremity (HCC)   3. H/O amputation of lesser toe, left (HCC)     Plan: Nails were trimmed on the left patient was given a prescription for [employer] for new socket new liner materials and supplies.  Follow-Up Instructions: Return if symptoms worsen or fail to improve.   Ortho Exam  Patient is alert, oriented, no adenopathy, well-dressed, normal affect, normal respiratory effort. Examination patient is developing ulcerations over the patella where he is subsiding into the socket the liner is worn the residual limb has decreased volume.  There is no end bearing ulcer on the residual limb.  Examination of the left foot patient has thickened discolored onychomycotic nails.  After informed consent the nails were trimmed without complication.  Imaging: No results found. No images are attached to the encounter.  Labs: Lab Results  Component Value Date   HGBA1C 7.6 (H) 06/21/2020   HGBA1C 7.1 (H) 03/07/2020   HGBA1C 7.2 (H) 12/03/2019   ESRSEDRATE 8 07/15/2019   ESRSEDRATE 56 (H) 06/01/2015   CRP 48 (H) 07/15/2019   REPTSTATUS 07/29/2019 FINAL 07/24/2019   GRAMSTAIN  07/24/2019    RARE WBC PRESENT, PREDOMINANTLY MONONUCLEAR NO ORGANISMS SEEN    CULT   07/24/2019    RARE STAPHYLOCOCCUS CAPRAE NO ANAEROBES ISOLATED Performed at [facility], [address], [place], [place] [address]    [facility] STAPHYLOCOCCUS CAPRAE 07/24/2019     Lab Results  Component Value Date   ALBUMIN 4.1 06/21/2020   ALBUMIN 4.2 05/09/2020   ALBUMIN 4.5 04/20/2020    No results found for: MG Lab Results  Component Value Date   VD25OH 16 (L) 11/05/2013    No results found for: PREALBUMIN CBC EXTENDED Latest Ref Rng & Units 06/02/2020 07/15/2019 06/07/2015  WBC 4.0 - 10.5 K/uL 8.5 11.8(H) 13.2(H)  RBC 4.22 - 5.81 MIL/uL 5.48 5.23 4.57  HGB 13.0 - 17.0 g/dL 46.5 03.5 46.5  HCT 68.1 - 52.0 % 47.2 47.4 41.7  PLT 150 - 400 K/uL 357 383 435(H)  NEUTROABS 1.7 - 7.7 K/uL 5.6 8.2(H) 8.9(H)  LYMPHSABS 0.7 - 4.0 K/uL 1.9 2.2 2.5     There is no height or weight on file to calculate BMI.  Orders:  No orders of the defined types were placed in this encounter.  No orders of the defined types were placed in this encounter.    Procedures: No procedures performed  Clinical Data: No additional findings.  ROS:  All other systems negative, except as noted in the HPI. Review of Systems  Objective: Vital Signs: There were no vitals  taken for this visit.  Specialty Comments:  No specialty comments available.  PMFS History: Patient Active Problem List   Diagnosis Date Noted  . Hepatic steatosis 06/24/2020  . Back pain 04/20/2020  . Flu vaccine need 04/20/2020  . COVID-19 virus vaccination declined 04/20/2020  . Left knee pain 04/20/2020  . Allergic sinusitis 04/20/2020  . Elevated liver enzymes 04/20/2020  . Diastasis of rectus abdominis 07/16/2019  . 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 Essential hypertension 06/19/2018  . Peripheral neuropathy 12/19/2017  . Acquired absence of right leg below knee (HCC) 04/17/2017  . Carpal tunnel syndrome 03/18/2017  . Colon cancer screening  06/21/2015  . Status post below knee amputation of right lower extremity (HCC) 06/07/2015  . 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 Diabetic polyneuropathy associated with type 2 diabetes mellitus (HCC) 12/09/2014  . Diabetes mellitus with carpal tunnel syndrome (HCC) 12/09/2014  . DJD (degenerative joint disease) of knee 07/21/2014  . Osteoarthritis, hip, bilateral 05/25/2014  . Onychomycosis 10/14/2013  . Pure hypercholesterolemia 09/25/2013  . Diabetic foot ulcer (HCC) 09/15/2013  . Poorly controlled type 2 diabetes mellitus with complication (HCC) 09/15/1997   Past Medical History:  Diagnosis Date  . Arthritis    bilateral hips  . Cutaneous abscess of left foot   . Deep vein thrombosis (DVT) (HCC)   . Diabetes mellitus    type II  . 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 DJD (degenerative joint disease)   . DVT (deep venous thrombosis) (HCC) 02/08/2014   Proximal provoked. Date of diagnosis February 08 2014 Duration of anticoagulation: 6 months. End date 08/12/2014.  Anticoagulant: Lovenox 120 units daily Switched to Eliquis on 05/25/2014     . 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 Non-pressure chronic ulcer of right calf, limited to breakdown of skin (HCC) 04/17/2017  . Pulmonary emboli (HCC) 02/08/2014   Date of diagnosis February 08 2014, on chest CTA Hospitalized for 3 days Had some symptoms of shortness of breath, and chest pain With intercurrent DVT of the left LE. Duration of anticoagulation: 8 months. End date 10/11/2014.  Anticoagulant: Lovenox 120 units daily Switched to Eliquis on 05/25/2014 per patient preference   . Pulmonary embolism (HCC)   . Sebaceous cyst    on back of neck    Family History  Problem Relation Age of Onset  . Breast cancer Mother   . Cancer Mother        small intestine  . Liver cancer Mother   . Diabetes Mother   . Diabetes Father   . Diabetes Brother   . Hypertension Maternal Grandmother   . Heart Problems Maternal  Grandmother   . Diabetes Paternal Grandmother   . Diabetes Paternal Grandfather   . Diabetes Brother     Past Surgical History:  Procedure Laterality Date  . AMPUTATION Right 06/03/2015   Procedure: Right Below Knee Amputation;  Surgeon: Nadara Mustard, MD;  Location: [facility] OR;  Service: Orthopedics;  Laterality: Right;  . AMPUTATION Left 07/24/2019   Procedure: LEFT FOOT 3RD RAY AMPUTATION;  Surgeon: Nadara Mustard, MD;  Location: [facility] OR;  Service: Orthopedics;  Laterality: Left;  . CLOSED REDUCTION WITH HUMER PIN INSERTION  1974   left hip  . HARDWARE REMOVAL Left 07/21/2014   Procedure: HARDWARE REMOVAL;  Surgeon: Loreta Ave, MD;  Location: [facility] OR;  Service: Orthopedics;  Laterality: Left;  . ROTATOR  CUFF REPAIR Right 2005 (approx)  . TOTAL HIP ARTHROPLASTY Left 07/21/2014   Procedure: TOTAL HIP ARTHROPLASTY ANTERIOR APPROACH;  Surgeon: Loreta Ave, MD;  Location: [facility] OR;  Service: Orthopedics;  Laterality: Left;  . TOTAL HIP ARTHROPLASTY Right 2006 (approx)   right hip replaced   Social History   Occupational History  . Occupation: disabled  Tobacco Use  . Smoking status: Never Smoker  . Smokeless tobacco: Never Used  Vaping Use  . Vaping Use: Never used  Substance and Sexual Activity  . Alcohol use: Yes    Alcohol/week: 0.0 standard drinks    Comment: beer and mixed drink maybe 5  times a month  . Drug use: No  . Sexual activity: Never

## 2020-09-14 ENCOUNTER — Telehealth: Payer: Self-pay

## 2020-09-14 NOTE — Telephone Encounter (Signed)
Left voicemail for patient to follow-up with [place] with proof of income for patient assistance.

## 2020-09-19 ENCOUNTER — Other Ambulatory Visit (INDEPENDENT_AMBULATORY_CARE_PROVIDER_SITE_OTHER): Payer: Medicare HMO

## 2020-09-19 ENCOUNTER — Other Ambulatory Visit: Payer: Self-pay

## 2020-09-19 DIAGNOSIS — Z794 Long term (current) use of insulin: Secondary | ICD-10-CM

## 2020-09-19 DIAGNOSIS — E1165 Type 2 diabetes mellitus with hyperglycemia: Secondary | ICD-10-CM

## 2020-09-19 LAB — COMPREHENSIVE METABOLIC PANEL
ALT: 40 U/L (ref 0–53)
AST: 37 U/L (ref 0–37)
Albumin: 4.3 g/dL (ref 3.5–5.2)
Alkaline Phosphatase: 54 U/L (ref 39–117)
BUN: 13 mg/dL (ref 6–23)
CO2: 29 mEq/L (ref 19–32)
Calcium: 9.8 mg/dL (ref 8.4–10.5)
Chloride: 99 mEq/L (ref 96–112)
Creatinine, Ser: 0.97 mg/dL (ref 0.40–1.50)
GFR: 86.39 mL/min (ref >60.00)
Glucose, Bld: 108 mg/dL — ABNORMAL HIGH (ref 70–99)
Potassium: 4.2 mEq/L (ref 3.5–5.1)
Sodium: 136 mEq/L (ref 135–145)
Total Bilirubin: 0.3 mg/dL (ref 0.2–1.2)
Total Protein: 7.6 g/dL (ref 6.0–8.3)

## 2020-09-20 LAB — FRUCTOSAMINE: Fructosamine: 245 umol/L (ref 0–285)

## 2020-09-22 ENCOUNTER — Other Ambulatory Visit: Payer: Self-pay | Admitting: Endocrinology

## 2020-09-22 ENCOUNTER — Other Ambulatory Visit: Payer: Self-pay

## 2020-09-22 ENCOUNTER — Ambulatory Visit (INDEPENDENT_AMBULATORY_CARE_PROVIDER_SITE_OTHER): Payer: Medicare HMO | Admitting: Endocrinology

## 2020-09-22 VITALS — BP 138/84 | HR 76 | Ht 72.0 in | Wt 199.2 lb

## 2020-09-22 DIAGNOSIS — K760 Fatty (change of) liver, not elsewhere classified: Secondary | ICD-10-CM | POA: Diagnosis not present

## 2020-09-22 DIAGNOSIS — E1165 Type 2 diabetes mellitus with hyperglycemia: Secondary | ICD-10-CM | POA: Diagnosis not present

## 2020-09-22 DIAGNOSIS — E782 Mixed hyperlipidemia: Secondary | ICD-10-CM | POA: Diagnosis not present

## 2020-09-22 DIAGNOSIS — Z794 Long term (current) use of insulin: Secondary | ICD-10-CM

## 2020-09-22 MED ORDER — PIOGLITAZONE HCL 15 MG PO TABS: 15.0000 mg | ORAL_TABLET | Freq: Every day | ORAL | 2 refills | Status: DC

## 2020-09-22 MED ORDER — LANTUS SOLOSTAR 100 UNIT/ML ~~LOC~~ SOPN: 10.0000 [IU] | PEN_INJECTOR | Freq: Every day | SUBCUTANEOUS | 99 refills | Status: DC

## 2020-09-22 NOTE — Progress Notes (Signed)
Gregory May 60 y.o.           Reason for Appointment: follow-up   History of Present Illness   Diagnosis: Type 2 DIABETES MELITUS, date of diagnosis:  1999     Previous history: He has previously been treated with metformin and Victoza and subsequently mealtime insulin added to control postprandial hyperglycemia Overall he has been  relatively noncompliant with his diet, medications, monitoring and followup Previously would not take Victoza regularly because of cost. Has not been taking any medications for the last year and a half because of commitments to family and cost A1c had increased to 12.3% and hyperglycemia discovered when he was hospitalized for foot ulcer. Also lost 20 pounds because of hyperglycemia   For better control, compliance and inconvenience he was switched to the V-go pump on 02/21/16  Recent history:   Insulin regimen: Previously V-go 20 unit basal with Novolog insulin, mealtime boluses 12-16 breakfast --- 12 -20 units before dinner  CURRENTLY: Lantus 5 to 7 units daily in a.m. and NovoLog 15-20 units before meals  Non-insulin hypoglycemic drugs: Metformin ER 1500 mg daily , Actos 15 mg daily Jardiance 27m daily    A1c  is last higher at 7.6 compared to 7.1 Fructosamine is 245  Current diabetes management, blood sugar patterns and problems identified.    He was started on Actos on his last visit because of blood sugars not being well controlled as well as fatty liver  However he still has not been able to get back on his V-go pump because of financial issues  With his trying to work on his diet and cut back on portions and sweets he appears to be losing weight again  Also sometimes may not eat 2 meals a day and only 1  Since he has not had the V-go pump he is using Lantus insulin which was previously prescribed but only taking 5 to 7 units in the morning  He seems to have relatively better fasting readings at home but somewhat higher readings  at suppertime; afternoon blood sugar on the lab was 108  Renal function stable with 25 mg Jardiance  He is not able to do much exercise        Side effects from medications: None       Glucometer:  Accu-Chek guide          Blood Glucose readings by recall   PRE-MEAL Fasting Lunch Dinner Bedtime Overall  Glucose range: 100-130  150 ?   Mean/median:            Meals:  usually 2 meals per day usually.  breakfast at 12 noon, evening meal 7-9 pm, variable snacks late at night    Physical activity: exercise: Minimal               Weight control:   Wt Readings from Last 3 Encounters:  09/22/20 199 lb 3.2 oz (90.4 kg)  06/23/20 201 lb 6.4 oz (91.4 kg)  05/18/20 204 lb (92.5 kg)          Diabetes labs:  Lab Results  Component Value Date   HGBA1C 7.6 (H) 06/21/2020   HGBA1C 7.1 (H) 03/07/2020   HGBA1C 7.2 (H) 12/03/2019   Lab Results  Component Value Date   MICROALBUR 5.0 (H) 12/03/2019   LDLCALC 78 06/21/2020   CREATININE 0.97 09/19/2020    Lab Results  Component Value Date   FRUCTOSAMINE 245 09/19/2020   FRUCTOSAMINE 272 03/03/2019   FRUCTOSAMINE  243 03/07/2017     Other active problems: See review of systems    Lab on 09/19/2020  Component Date Value Ref Range Status  . Fructosamine 09/19/2020 245  0 - 285 umol/L Final   Comment: Published reference interval for apparently healthy subjects between age 80 and 26 is 75 - 285 umol/L and in a poorly controlled diabetic population is 228 - 563 umol/L with a mean of 396 umol/L.   Marland Kitchen Sodium 09/19/2020 136  135 - 145 mEq/L Final  . Potassium 09/19/2020 4.2  3.5 - 5.1 mEq/L Final  . Chloride 09/19/2020 99  96 - 112 mEq/L Final  . CO2 09/19/2020 29  19 - 32 mEq/L Final  . Glucose, Bld 09/19/2020 108* 70 - 99 mg/dL Final  . BUN 09/19/2020 13  6 - 23 mg/dL Final  . Creatinine, Ser 09/19/2020 0.97  0.40 - 1.50 mg/dL Final  . Total Bilirubin 09/19/2020 0.3  0.2 - 1.2 mg/dL Final  . Alkaline Phosphatase 09/19/2020  54  39 - 117 U/L Final  . AST 09/19/2020 37  0 - 37 U/L Final  . ALT 09/19/2020 40  0 - 53 U/L Final  . Total Protein 09/19/2020 7.6  6.0 - 8.3 g/dL Final  . Albumin 09/19/2020 4.3  3.5 - 5.2 g/dL Final  . GFR 09/19/2020 86.39  >60.00 mL/min Final   Calculated using the CKD-EPI Creatinine Equation (2021)  . Calcium 09/19/2020 9.8  8.4 - 10.5 mg/dL Final     Allergies as of 09/22/2020   No Known Allergies     Medication List       Accurate as of September 22, 2020  3:37 PM. If you have any questions, ask your nurse or doctor.        Accu-Chek FastClix Lancets Misc Use Accu Chek Fastclix lancets to check blood sugar three times daily. DX:E11.65   Accu-Chek Guide Me w/Device Kit 1 each by Does not apply route daily.   Accu-Chek Guide test strip Generic drug: glucose blood Use as instructed to check blood sugar three times daily. DX:E11.65   acetaminophen 325 MG tablet Commonly known as: TYLENOL Take 650 mg by mouth as needed (pain).   diclofenac Sodium 1 % Gel Commonly known as: Voltaren Apply 2 g topically 4 (four) times daily.   empagliflozin 25 MG Tabs tablet Commonly known as: Jardiance Take 1 tablet (25 mg total) by mouth daily.   fluticasone 50 MCG/ACT nasal spray Commonly known as: Flonase Place 2 sprays into both nostrils daily.   gabapentin 300 MG capsule Commonly known as: NEURONTIN TAKE 1 CAPSULE FOUR TIMES DAILY AS NEEDED   insulin aspart 100 UNIT/ML injection Commonly known as: novoLOG Max of 56 units daily in V-Go pump   metFORMIN 750 MG 24 hr tablet Commonly known as: GLUCOPHAGE-XR TAKE 2 TABLETS (1,500 MG TOTAL) BY MOUTH DAILY.   pioglitazone 15 MG tablet Commonly known as: Actos Take 1 tablet (15 mg total) by mouth daily.   pravastatin 80 MG tablet Commonly known as: PRAVACHOL Take 1 tablet by mouth once daily   V-Go 20 Kit APPLY V-GO 20 PUMP TO ARM ONE TIME DAILY AS DIRECTED       Allergies: No Known Allergies  Past Medical  History:  Diagnosis Date  . Arthritis    bilateral hips  . Cutaneous abscess of left foot   . Deep vein thrombosis (DVT) ([place])   . Diabetes mellitus    type II  . Diabetic ulcer of heel ([place])  Right heel  . DJD (degenerative joint disease)   . DVT (deep venous thrombosis) ([place]) 02/08/2014   Proximal provoked. Date of diagnosis February 08 2014 Duration of anticoagulation: 6 months. End date 08/12/2014.  Anticoagulant: Lovenox 120 units daily Switched to Eliquis on 05/25/2014     . 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 Non-pressure chronic ulcer of right calf, limited to breakdown of skin ([place]) 04/17/2017  . Pulmonary emboli ([place]) 02/08/2014   Date of diagnosis February 08 2014, on chest CTA Hospitalized for 3 days Had some symptoms of shortness of breath, and chest pain With intercurrent DVT of the left LE. Duration of anticoagulation: 8 months. End date 10/11/2014.  Anticoagulant: Lovenox 120 units daily Switched to Eliquis on 05/25/2014 per patient preference   . Pulmonary embolism ([place])   . Sebaceous cyst    on back of neck    Past Surgical History:  Procedure Laterality Date  . AMPUTATION Right 06/03/2015   Procedure: Right Below Knee Amputation;  Surgeon: [name], MD;  Location: [facility];  Service: Orthopedics;  Laterality: Right;  . AMPUTATION Left 07/24/2019   Procedure: LEFT FOOT 3RD RAY AMPUTATION;  Surgeon: [name], MD;  Location: [facility];  Service: Orthopedics;  Laterality: Left;  . CLOSED REDUCTION WITH HUMER PIN INSERTION  1974   left hip  . HARDWARE REMOVAL Left 07/21/2014   Procedure: HARDWARE REMOVAL;  Surgeon: Ninetta Lights, MD;  Location: [facility];  Service: Orthopedics;  Laterality: Left;  . ROTATOR CUFF REPAIR Right 2005 (approx)  . TOTAL HIP ARTHROPLASTY Left 07/21/2014   Procedure: TOTAL HIP ARTHROPLASTY ANTERIOR APPROACH;  Surgeon: Ninetta Lights, MD;  Location: [facility];  Service: Orthopedics;  Laterality: Left;  . TOTAL HIP ARTHROPLASTY Right 2006 (approx)    right hip replaced    Family History  Problem Relation Age of Onset  . Breast cancer Mother   . Cancer Mother        small intestine  . Liver cancer Mother   . Diabetes Mother   . Diabetes Father   . Diabetes Brother   . Hypertension Maternal Grandmother   . Heart Problems Maternal Grandmother   . Diabetes Paternal Grandmother   . Diabetes Paternal Grandfather   . Diabetes Brother     Social History:  reports that he has never smoked. He has never used smokeless tobacco. He reports current alcohol use. He reports that he does not use drugs.  Review of Systems:   Lipids: He is on Pravachol  with variable control  LDL is last fairly good, likely from better compliance with pravastatin   Lab Results  Component Value Date   CHOL 151 06/21/2020   CHOL 210 (H) 03/07/2020   CHOL 174 12/03/2019   Lab Results  Component Value Date   HDL 43.10 06/21/2020   HDL 43.80 03/07/2020   HDL 49.10 12/03/2019   Lab Results  Component Value Date   LDLCALC 78 06/21/2020   LDLCALC 145 (H) 03/07/2020   LDLCALC 106 (H) 12/03/2019   Lab Results  Component Value Date   TRIG 147.0 06/21/2020   TRIG 107.0 03/07/2020   TRIG 90.0 12/03/2019   Lab Results  Component Value Date   CHOLHDL 3 06/21/2020   CHOLHDL 5 03/07/2020   CHOLHDL 4 12/03/2019   Lab Results  Component Value Date   LDLDIRECT 91.0 07/11/2015    Has history of abnormal liver functions, these are consistently higher Previously had not improved with stopping pravastatin  With starting Actos in 06/2020 his ALT has finally come back to normal  Has been told by her PCP and gastroenterologist to have fatty liver which was seen on his ultrasound  Lab Results  Component Value Date   ALT 40 09/19/2020   ALT 61 (H) 06/21/2020   ALT 69 (H) 05/09/2020   ALT 80 (H) 04/20/2020   ALT 82 (H) 03/07/2020     HYPERTENSION: Currently not requiring medications Controlled with Jardiance Blood pressure is high normal  occasionally  Renal function normal Microalbumin normal as of 4/21   BP Readings from Last 3 Encounters:  09/22/20 138/84  06/23/20 104/70  05/09/20 110/80   Lab Results  Component Value Date   CREATININE 0.97 09/19/2020   CREATININE 1.00 06/21/2020   CREATININE 1.00 06/02/2020   NEUROPATHY:  He has had some burning in the left foot along with discomfort, paresthesia, some numbness and difficulty using his right hand Treated with gabapentin     Examination:   BP 138/84 (BP Location: Right Arm, Patient Position: Sitting, Cuff Size: Large)   Pulse 76   Ht 6' (1.829 m)   Wt 199 lb 3.2 oz (90.4 kg)   SpO2 95%   BMI 27.02 kg/m   Body mass index is 27.02 kg/m.   ASSESSMENT/ PLAN:     Diabetes type 2 insulin-dependent:  See history of present illness for detailed discussion of current diabetes management, blood sugar patterns and problems identified  A1c is: Previous visit 7.6, previously 7.1 Now is on Lantus and NovoLog along with Jardiance and Metformin  His blood sugars are relatively better adjusted by his fructosamine of 246  Did not bring his monitor for download again Not clear how often he is checking his blood sugars Lab glucose was 108  Surprisingly with not using much basal insulin and only 5 to 7 units of Lantus his blood sugars overall are not much higher compared to when he was using the V-go 20 units basal He does appear to be cutting back on his portions and carbohydrates and is losing weight again Also likely benefiting from Actos which was started for his fatty liver Reminded him to check blood sugars after meals and also bring his monitor for download each visit  LIPIDS: LDL is back down to good level with resuming pravastatin  Abnormal liver functions: Liver functions are back to normal Likely benefiting from Actos as well as some weight loss and better diabetes control We will continue Actos and 90-day prescription sent  Lipids: We will  recheck on the next visit  Follow-up in 2 months with A1c  There are no Patient Instructions on file for this visit.    Elayne Snare 09/22/2020, 3:37 PM   Note: This office note was prepared with Dragon voice recognition system technology. Any transcriptional errors that result from this process are unintentional.

## 2020-09-22 NOTE — Patient Instructions (Addendum)
Lantus/ basal insulin 10 units in am   Check blood sugars on waking up 3-4  days a week  Also check blood sugars about 2 hours after meals and do this after different meals by rotation  Recommended blood sugar levels on waking up are 90-130 and about 2 hours after meal is 130-160  Please bring your blood sugar monitor to each visit, thank you

## 2020-09-27 ENCOUNTER — Other Ambulatory Visit: Payer: Self-pay | Admitting: *Deleted

## 2020-09-27 ENCOUNTER — Other Ambulatory Visit: Payer: Self-pay | Admitting: Endocrinology

## 2020-09-27 ENCOUNTER — Telehealth: Payer: Self-pay | Admitting: Endocrinology

## 2020-09-27 MED ORDER — PRAVASTATIN SODIUM 80 MG PO TABS
80.0000 mg | ORAL_TABLET | Freq: Every day | ORAL | 1 refills | Status: DC
Start: 2020-09-27 — End: 2020-09-27

## 2020-09-27 MED ORDER — PRAVASTATIN SODIUM 80 MG PO TABS
80.0000 mg | ORAL_TABLET | Freq: Every day | ORAL | 1 refills | Status: DC
Start: 2020-09-27 — End: 2021-05-25

## 2020-09-27 NOTE — Telephone Encounter (Signed)
Patient called to request a new RX for a 90 day supply of pravastatin (PRAVACHOL) 80 MG tablet Be sent asap (Patient states he ran out today) to  [facility] [place], [place] - [address] Phone:  [phone]  Fax:  [phone]

## 2020-10-19 ENCOUNTER — Other Ambulatory Visit: Payer: Self-pay | Admitting: Endocrinology

## 2020-10-28 NOTE — Progress Notes (Signed)
Received notification from [employer] regarding approval for NOVOLOG VIALS. Patient assistance approved until 07/12/21.  Meds will ship to internal medicine within the next 2 weeks. Refills are automatic.  Phone: [phone]

## 2020-11-02 ENCOUNTER — Telehealth: Payer: Self-pay | Admitting: *Deleted

## 2020-11-02 NOTE — Progress Notes (Signed)
Received notification from [employer]) regarding approval for [employer]. Patient assistance approved from 10/29/20 to 08/12/21.  Meds will ship to patients home (estimated delivery Friday 11/04/20). Patient can call for refills.   Phone: [phone]

## 2020-11-02 NOTE — Telephone Encounter (Signed)
Jardiance 25 mg sample (LOT# [id number]) signed out by Dr Claudette Laws; ready for pt's brother to pick up tomorrow. Thanks

## 2020-11-02 NOTE — Telephone Encounter (Signed)
Pt called and stated he had talked to Glen, Associate Professor , who stated Gregory May has bee approved via pt assistance program. And he could get samples from [place] until his shipment arrives, might be Friday or later. He takes Jardiance 25 mg daily. Stated he will have his brother Alvis Edgell, who works at [employer], to pick up the samples tomorrow. I will ask The Attending to sign out med.

## 2020-11-03 NOTE — Telephone Encounter (Signed)
Jardiance sample given to brother, he confirmed patient's DOB. SChaplin, RN,BSN

## 2020-11-10 ENCOUNTER — Telehealth: Payer: Self-pay | Admitting: *Deleted

## 2020-11-10 NOTE — Telephone Encounter (Signed)
Pt 's brother picked up Novolog (from pt assistance program).

## 2020-11-21 ENCOUNTER — Other Ambulatory Visit (INDEPENDENT_AMBULATORY_CARE_PROVIDER_SITE_OTHER): Payer: Medicare HMO

## 2020-11-21 ENCOUNTER — Telehealth: Payer: Self-pay | Admitting: Endocrinology

## 2020-11-21 ENCOUNTER — Other Ambulatory Visit: Payer: Self-pay

## 2020-11-21 DIAGNOSIS — Z794 Long term (current) use of insulin: Secondary | ICD-10-CM

## 2020-11-21 DIAGNOSIS — E782 Mixed hyperlipidemia: Secondary | ICD-10-CM | POA: Diagnosis not present

## 2020-11-21 DIAGNOSIS — E1165 Type 2 diabetes mellitus with hyperglycemia: Secondary | ICD-10-CM

## 2020-11-21 LAB — COMPREHENSIVE METABOLIC PANEL
ALT: 49 U/L (ref 0–53)
AST: 47 U/L — ABNORMAL HIGH (ref 0–37)
Albumin: 4.2 g/dL (ref 3.5–5.2)
Alkaline Phosphatase: 61 U/L (ref 39–117)
BUN: 10 mg/dL (ref 6–23)
CO2: 28 mEq/L (ref 19–32)
Calcium: 10.1 mg/dL (ref 8.4–10.5)
Chloride: 103 mEq/L (ref 96–112)
Creatinine, Ser: 0.95 mg/dL (ref 0.40–1.50)
GFR: 88.47 mL/min (ref >60.00)
Glucose, Bld: 105 mg/dL — ABNORMAL HIGH (ref 70–99)
Potassium: 4.3 mEq/L (ref 3.5–5.1)
Sodium: 139 mEq/L (ref 135–145)
Total Bilirubin: 0.3 mg/dL (ref 0.2–1.2)
Total Protein: 7.6 g/dL (ref 6.0–8.3)

## 2020-11-21 LAB — LIPID PANEL
Cholesterol: 144 mg/dL (ref 0–200)
HDL: 58.9 mg/dL (ref >39.00)
LDL Cholesterol: 65 mg/dL (ref 0–99)
NonHDL: 85.17
Total CHOL/HDL Ratio: 2
Triglycerides: 102 mg/dL (ref 0.0–149.0)
VLDL: 20.4 mg/dL (ref 0.0–40.0)

## 2020-11-21 LAB — HEMOGLOBIN A1C: Hgb A1c MFr Bld: 7.5 % — ABNORMAL HIGH (ref 4.6–6.5)

## 2020-11-21 NOTE — Telephone Encounter (Signed)
Pt is at the front desk wondering if he can have samples of insulin glargine (LANTUS SOLOSTAR) 100 UNIT/ML Solostar Pen he ran out on Saturday.

## 2020-11-23 NOTE — Telephone Encounter (Signed)
Pt was given sample 1 pen lantus 100 units ok by Dr. Lafe Garin.

## 2020-11-24 ENCOUNTER — Other Ambulatory Visit: Payer: Self-pay

## 2020-11-24 ENCOUNTER — Ambulatory Visit (INDEPENDENT_AMBULATORY_CARE_PROVIDER_SITE_OTHER): Payer: Medicare HMO | Admitting: Endocrinology

## 2020-11-24 ENCOUNTER — Encounter: Payer: Self-pay | Admitting: Endocrinology

## 2020-11-24 VITALS — BP 124/80 | HR 86 | Ht 72.0 in | Wt 201.0 lb

## 2020-11-24 DIAGNOSIS — E1165 Type 2 diabetes mellitus with hyperglycemia: Secondary | ICD-10-CM | POA: Diagnosis not present

## 2020-11-24 DIAGNOSIS — Z794 Long term (current) use of insulin: Secondary | ICD-10-CM

## 2020-11-24 DIAGNOSIS — E782 Mixed hyperlipidemia: Secondary | ICD-10-CM

## 2020-11-24 DIAGNOSIS — K760 Fatty (change of) liver, not elsewhere classified: Secondary | ICD-10-CM | POA: Diagnosis not present

## 2020-11-24 MED ORDER — INSULIN GLARGINE 100 UNIT/ML ~~LOC~~ SOLN: 12.0000 [IU] | Freq: Every day | SUBCUTANEOUS | 1 refills | Status: DC

## 2020-11-24 NOTE — Patient Instructions (Addendum)
Lantus 12 units once in pm daily  Check assistance for Tresiba or Levemir  Take Novolog 14-18 units  More sugars at bedtime  Take 2 Actos 15mg  daily and call for next Rx

## 2020-11-24 NOTE — Progress Notes (Signed)
Gregory May 60 y.o.           Reason for Appointment: follow-up   History of Present Illness   Diagnosis: Type 2 DIABETES MELITUS, date of diagnosis:  1999     Previous history: He has previously been treated with metformin and Victoza and subsequently mealtime insulin added to control postprandial hyperglycemia Overall he has been  relatively noncompliant with his diet, medications, monitoring and followup Previously would not take Victoza regularly because of cost. Has not been taking any medications for the last year and a half because of commitments to family and cost A1c had increased to 12.3% and hyperglycemia discovered when he was hospitalized for foot ulcer. Also lost 20 pounds because of hyperglycemia   For better control, compliance and inconvenience he was switched to the V-go pump on 02/21/16  Recent history:   Insulin regimen: Previously V-go 20 unit basal with Novolog insulin, mealtime boluses 12-16 breakfast --- 12 -20 units before dinner  CURRENTLY: Lantus 5 units bid. and NovoLog 15-20 units before meals  Non-insulin hypoglycemic drugs: Metformin ER 1500 mg daily , Actos 15 mg daily Jardiance 38m daily    A1c  is last higher at 7.6 compared to 7.1 Fructosamine is 245  Current diabetes management, blood sugar patterns and problems identified.    He has continued taking Lantus with samples  Although on the last visit his morning readings were reportedly fairly good the is having relatively high readings lately even with taking Lantus 5 units twice daily; previously on his own was taking only 5 to 7 units once a day  He was however not able to get this for about a week because of financial issues  Also was started on Actos in 11/21  He is checking his blood sugars sporadically but not usually after dinner  Again he still has not been able to get back on his V-go pump because of financial issues  He is however still taking the same amount of NovoLog  as before  This is despite not eating a lot of sweets or snacks  Lab glucose was 10 5 in the afternoon  Renal function stable with 25 mg Jardiance  He is not able to do much exercise        Side effects from medications: None       Glucometer:  Accu-Chek guide          Blood Glucose readings by download   PRE-MEAL Fasting Lunch Dinner Bedtime Overall  Glucose range:  146-171   114-144   59   Mean/median:  148    127       Meals:  usually 2 meals per day usually.  breakfast at 12 noon, evening meal 7-9 pm, variable snacks late at night    Physical activity: exercise: Minimal               Weight control:   Wt Readings from Last 3 Encounters:  11/24/20 201 lb (91.2 kg)  09/22/20 199 lb 3.2 oz (90.4 kg)  06/23/20 201 lb 6.4 oz (91.4 kg)          Diabetes labs:  Lab Results  Component Value Date   HGBA1C 7.5 (H) 11/21/2020   HGBA1C 7.6 (H) 06/21/2020   HGBA1C 7.1 (H) 03/07/2020   Lab Results  Component Value Date   MICROALBUR 5.0 (H) 12/03/2019   LDLCALC 65 11/21/2020   CREATININE 0.95 11/21/2020    Lab Results  Component Value Date  FRUCTOSAMINE 245 09/19/2020   FRUCTOSAMINE 272 03/03/2019   FRUCTOSAMINE 243 03/07/2017     Other active problems: See review of systems    Lab on 11/21/2020  Component Date Value Ref Range Status  . Cholesterol 11/21/2020 144  0 - 200 mg/dL Final   ATP III Classification       Desirable:  < 200 mg/dL               Borderline High:  200 - 239 mg/dL          High:  > = 240 mg/dL  . Triglycerides 11/21/2020 102.0  0.0 - 149.0 mg/dL Final   Normal:  <150 mg/dLBorderline High:  150 - 199 mg/dL  . HDL 11/21/2020 58.90  >39.00 mg/dL Final  . VLDL 11/21/2020 20.4  0.0 - 40.0 mg/dL Final  . LDL Cholesterol 11/21/2020 65  0 - 99 mg/dL Final  . Total CHOL/HDL Ratio 11/21/2020 2   Final                  Men          Women1/2 Average Risk     3.4          3.3Average Risk          5.0          4.42X Average Risk          9.6           7.13X Average Risk          15.0          11.0                      . NonHDL 11/21/2020 85.17   Final   NOTE:  Non-HDL goal should be 30 mg/dL higher than patient's LDL goal (i.e. LDL goal of < 70 mg/dL, would have non-HDL goal of < 100 mg/dL)  . Sodium 11/21/2020 139  135 - 145 mEq/L Final  . Potassium 11/21/2020 4.3  3.5 - 5.1 mEq/L Final  . Chloride 11/21/2020 103  96 - 112 mEq/L Final  . CO2 11/21/2020 28  19 - 32 mEq/L Final  . Glucose, Bld 11/21/2020 105* 70 - 99 mg/dL Final  . BUN 11/21/2020 10  6 - 23 mg/dL Final  . Creatinine, Ser 11/21/2020 0.95  0.40 - 1.50 mg/dL Final  . Total Bilirubin 11/21/2020 0.3  0.2 - 1.2 mg/dL Final  . Alkaline Phosphatase 11/21/2020 61  39 - 117 U/L Final  . AST 11/21/2020 47* 0 - 37 U/L Final  . ALT 11/21/2020 49  0 - 53 U/L Final  . Total Protein 11/21/2020 7.6  6.0 - 8.3 g/dL Final  . Albumin 11/21/2020 4.2  3.5 - 5.2 g/dL Final  . GFR 11/21/2020 88.47  >60.00 mL/min Final   Calculated using the CKD-EPI Creatinine Equation (2021)  . Calcium 11/21/2020 10.1  8.4 - 10.5 mg/dL Final  . Hgb A1c MFr Bld 11/21/2020 7.5* 4.6 - 6.5 % Final   Glycemic Control Guidelines for People with Diabetes:Non Diabetic:  <6%Goal of Therapy: <7%Additional Action Suggested:  >8%      Allergies as of 11/24/2020   No Known Allergies     Medication List       Accurate as of November 24, 2020  3:37 PM. If you have any questions, ask your nurse or doctor.        Accu-Chek FastClix Lancets Misc Use Accu Chek  Fastclix lancets to check blood sugar three times daily. DX:E11.65   Accu-Chek Guide Me w/Device Kit 1 each by Does not apply route daily.   Accu-Chek Guide test strip Generic drug: glucose blood TEST BLOOD SUGAR THREE TIMES DAILY AS DIRECTED   acetaminophen 325 MG tablet Commonly known as: TYLENOL Take 650 mg by mouth as needed (pain).   diclofenac Sodium 1 % Gel Commonly known as: Voltaren Apply 2 g topically 4 (four) times daily.   empagliflozin  25 MG Tabs tablet Commonly known as: Jardiance Take 1 tablet (25 mg total) by mouth daily.   fluticasone 50 MCG/ACT nasal spray Commonly known as: Flonase Place 2 sprays into both nostrils daily.   gabapentin 300 MG capsule Commonly known as: NEURONTIN TAKE 1 CAPSULE FOUR TIMES DAILY AS NEEDED   insulin aspart 100 UNIT/ML injection Commonly known as: novoLOG Max of 56 units daily in V-Go pump   Lantus SoloStar 100 UNIT/ML Solostar Pen Generic drug: insulin glargine Inject 10 Units into the skin daily.   metFORMIN 750 MG 24 hr tablet Commonly known as: GLUCOPHAGE-XR TAKE 2 TABLETS (1,500 MG TOTAL) BY MOUTH DAILY.   pioglitazone 15 MG tablet Commonly known as: Actos Take 1 tablet (15 mg total) by mouth daily.   pravastatin 80 MG tablet Commonly known as: PRAVACHOL Take 1 tablet (80 mg total) by mouth daily.   V-Go 20 Kit APPLY V-GO 20 PUMP TO ARM ONE TIME DAILY AS DIRECTED       Allergies: No Known Allergies  Past Medical History:  Diagnosis Date  . Arthritis    bilateral hips  . Cutaneous abscess of left foot   . Deep vein thrombosis (DVT) ([place])   . Diabetes mellitus    type II  . 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 DJD (degenerative joint disease)   . DVT (deep venous thrombosis) ([place]) 02/08/2014   Proximal provoked. Date of diagnosis February 08 2014 Duration of anticoagulation: 6 months. End date 08/12/2014.  Anticoagulant: Lovenox 120 units daily Switched to Eliquis on 05/25/2014     . 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 Non-pressure chronic ulcer of right calf, limited to breakdown of skin ([place]) 04/17/2017  . Pulmonary emboli ([place]) 02/08/2014   Date of diagnosis February 08 2014, on chest CTA Hospitalized for 3 days Had some symptoms of shortness of breath, and chest pain With intercurrent DVT of the left LE. Duration of anticoagulation: 8 months. End date 10/11/2014.  Anticoagulant: Lovenox 120 units daily Switched to Eliquis on 05/25/2014 per patient  preference   . Pulmonary embolism ([place])   . Sebaceous cyst    on back of neck    Past Surgical History:  Procedure Laterality Date  . AMPUTATION Right 06/03/2015   Procedure: Right Below Knee Amputation;  Surgeon: [name], MD;  Location: [facility];  Service: Orthopedics;  Laterality: Right;  . AMPUTATION Left 07/24/2019   Procedure: LEFT FOOT 3RD RAY AMPUTATION;  Surgeon: [name], MD;  Location: [facility];  Service: Orthopedics;  Laterality: Left;  . CLOSED REDUCTION WITH HUMER PIN INSERTION  1974   left hip  . HARDWARE REMOVAL Left 07/21/2014   Procedure: HARDWARE REMOVAL;  Surgeon: Ninetta Lights, MD;  Location: [facility];  Service: Orthopedics;  Laterality: Left;  . ROTATOR CUFF REPAIR Right 2005 (approx)  . TOTAL HIP ARTHROPLASTY Left 07/21/2014   Procedure: TOTAL HIP ARTHROPLASTY ANTERIOR APPROACH;  Surgeon: Ninetta Lights, MD;  Location: [facility]  OR;  Service: Orthopedics;  Laterality: Left;  . TOTAL HIP ARTHROPLASTY Right 2006 (approx)   right hip replaced    Family History  Problem Relation Age of Onset  . Breast cancer Mother   . Cancer Mother        small intestine  . Liver cancer Mother   . Diabetes Mother   . Diabetes Father   . Diabetes Brother   . Hypertension Maternal Grandmother   . Heart Problems Maternal Grandmother   . Diabetes Paternal Grandmother   . Diabetes Paternal Grandfather   . Diabetes Brother     Social History:  reports that he has never smoked. He has never used smokeless tobacco. He reports current alcohol use. He reports that he does not use drugs.  Review of Systems:   Lipids: He is on Pravachol 80 mg  LDL is fairly good, now consistent from better compliance with pravastatin   Lab Results  Component Value Date   CHOL 144 11/21/2020   CHOL 151 06/21/2020   CHOL 210 (H) 03/07/2020   Lab Results  Component Value Date   HDL 58.90 11/21/2020   HDL 43.10 06/21/2020   HDL 43.80 03/07/2020   Lab Results  Component Value Date    LDLCALC 65 11/21/2020   LDLCALC 78 06/21/2020   LDLCALC 145 (H) 03/07/2020   Lab Results  Component Value Date   TRIG 102.0 11/21/2020   TRIG 147.0 06/21/2020   TRIG 107.0 03/07/2020   Lab Results  Component Value Date   CHOLHDL 2 11/21/2020   CHOLHDL 3 06/21/2020   CHOLHDL 5 03/07/2020   Lab Results  Component Value Date   LDLDIRECT 91.0 07/11/2015    Has history of abnormal liver functions chronically Previously had not improved with stopping pravastatin  With starting Actos in 06/2020 his liver functions had come back to normal but now AST is slightly higher than normal at 47 He has a fatty liver which was seen on his ultrasound  Lab Results  Component Value Date   ALT 49 11/21/2020   ALT 40 09/19/2020   ALT 61 (H) 06/21/2020   ALT 69 (H) 05/09/2020   ALT 80 (H) 04/20/2020     HYPERTENSION: Still not requiring pharmacological treatment Controlled with Jardiance Blood pressure is high normal occasionally  Renal function normal Microalbumin normal as of 4/21   BP Readings from Last 3 Encounters:  11/24/20 124/80  09/22/20 138/84  06/23/20 104/70   Lab Results  Component Value Date   CREATININE 0.95 11/21/2020   CREATININE 0.97 09/19/2020   CREATININE 1.00 06/21/2020   NEUROPATHY:  He has had burning in the left foot along with discomfort, paresthesia, some numbness and difficulty using his right hand Treated with gabapentin     Examination:   BP 124/80   Pulse 86   Ht 6' (1.829 m)   Wt 201 lb (91.2 kg)   SpO2 94%   BMI 27.26 kg/m   Body mass index is 27.26 kg/m.   ASSESSMENT/ PLAN:     Diabetes type 2 insulin-dependent:  See history of present illness for detailed discussion of current diabetes management, blood sugar patterns and problems identified  A1c is about the same at 7.5 He is on Lantus and NovoLog along with Jardiance, low-dose Actos and Metformin  Still appears to be needing relatively small amounts of basal insulin, total  10 units a day will Recently fasting readings are averaging about 145 although on a few days he had not taken  Lantus Not enough readings being done after meals especially dinnertime Generally diet has being better  Currently since he is requiring relatively low doses of basal insulin will not start him back on the V-go pump as yet He will start taking Lantus once a day only in the evening using 12 units and adjusting further based on fasting morning sugars Reminded him to check more readings after dinner To adjust NovoLog more carefully based on how much carbohydrate he is getting and usually will not need more than 18 units  LIPIDS: LDL is excellent with 80 mg pravastatin  Abnormal liver functions: Liver functions are improved with Actos However AST slightly higher and will increase his Actos to 30 mg He will call when he needs a new prescription from his mail order  A1c to be done in 3 months again   There are no Patient Instructions on file for this visit.    Elayne Snare 11/24/2020, 3:37 PM   Note: This office note was prepared with Dragon voice recognition system technology. Any transcriptional errors that result from this process are unintentional.

## 2020-12-08 ENCOUNTER — Telehealth: Payer: Self-pay | Admitting: Endocrinology

## 2020-12-08 ENCOUNTER — Other Ambulatory Visit: Payer: Self-pay | Admitting: *Deleted

## 2020-12-08 MED ORDER — PIOGLITAZONE HCL 15 MG PO TABS
ORAL_TABLET | ORAL | 2 refills | Status: DC
Start: 2020-12-08 — End: 2021-02-23

## 2020-12-08 NOTE — Telephone Encounter (Signed)
Called pt informed him that new RX will be sent to [facility]

## 2020-12-08 NOTE — Telephone Encounter (Signed)
Patient called to let Dr Lucianne Muss (per provider) he is about at the end of his RX for 1 Pioglitazone 15 MG per day and should have the new RX for 2 Pioglitazone 15 MG sent to [facility]

## 2020-12-14 ENCOUNTER — Telehealth: Payer: Self-pay

## 2020-12-14 ENCOUNTER — Other Ambulatory Visit: Payer: Self-pay | Admitting: *Deleted

## 2020-12-14 NOTE — Telephone Encounter (Signed)
Received notification from [employer] regarding approval for TRESIBA U-100. Patient assistance approved until 06/3021.  Medication will auto ship to Internal Medicine center. Pt is to stop taking Lantus once beginning this medication.  Phone: [phone]

## 2020-12-17 ENCOUNTER — Other Ambulatory Visit: Payer: Self-pay | Admitting: Endocrinology

## 2020-12-27 ENCOUNTER — Other Ambulatory Visit: Payer: Self-pay | Admitting: Student

## 2020-12-27 DIAGNOSIS — J309 Allergic rhinitis, unspecified: Secondary | ICD-10-CM

## 2020-12-28 ENCOUNTER — Telehealth: Payer: Self-pay

## 2020-12-28 NOTE — Telephone Encounter (Signed)
Let pt know his Gregory May was delivered here to the clinic (from [employer]) and is ready for pickup.  Pt said he will try to send his brother to pickup medication tomorrow (12/29/20).  Medication is packaged and labeled in med fridge. He has pen needles labeled as well on counter.

## 2020-12-29 NOTE — Telephone Encounter (Signed)
Gregory May listed on med list as historical. Verified with Dr. Oswaldo Done that patient should be taking this med. Handed patient 1 box with 5 pens of Tresiba and 2 boxes of pen needles. He was very Adult nurse.

## 2021-01-07 ENCOUNTER — Encounter: Payer: Self-pay | Admitting: *Deleted

## 2021-02-17 ENCOUNTER — Other Ambulatory Visit: Payer: Self-pay | Admitting: Internal Medicine

## 2021-02-17 DIAGNOSIS — J309 Allergic rhinitis, unspecified: Secondary | ICD-10-CM

## 2021-02-20 ENCOUNTER — Other Ambulatory Visit: Payer: Self-pay

## 2021-02-20 ENCOUNTER — Other Ambulatory Visit (INDEPENDENT_AMBULATORY_CARE_PROVIDER_SITE_OTHER): Payer: Medicare HMO

## 2021-02-20 DIAGNOSIS — Z794 Long term (current) use of insulin: Secondary | ICD-10-CM

## 2021-02-20 DIAGNOSIS — E1165 Type 2 diabetes mellitus with hyperglycemia: Secondary | ICD-10-CM | POA: Diagnosis not present

## 2021-02-20 LAB — HEMOGLOBIN A1C: Hgb A1c MFr Bld: 7.3 % — ABNORMAL HIGH (ref 4.6–6.5)

## 2021-02-20 LAB — COMPREHENSIVE METABOLIC PANEL
ALT: 37 U/L (ref 0–53)
AST: 31 U/L (ref 0–37)
Albumin: 4.1 g/dL (ref 3.5–5.2)
Alkaline Phosphatase: 59 U/L (ref 39–117)
BUN: 17 mg/dL (ref 6–23)
CO2: 25 mEq/L (ref 19–32)
Calcium: 9.4 mg/dL (ref 8.4–10.5)
Chloride: 102 mEq/L (ref 96–112)
Creatinine, Ser: 0.91 mg/dL (ref 0.40–1.50)
GFR: 92.99 mL/min (ref >60.00)
Glucose, Bld: 134 mg/dL — ABNORMAL HIGH (ref 70–99)
Potassium: 4.1 mEq/L (ref 3.5–5.1)
Sodium: 137 mEq/L (ref 135–145)
Total Bilirubin: 0.3 mg/dL (ref 0.2–1.2)
Total Protein: 7.1 g/dL (ref 6.0–8.3)

## 2021-02-23 ENCOUNTER — Ambulatory Visit (INDEPENDENT_AMBULATORY_CARE_PROVIDER_SITE_OTHER): Payer: Medicare HMO | Admitting: Endocrinology

## 2021-02-23 ENCOUNTER — Other Ambulatory Visit: Payer: Self-pay

## 2021-02-23 ENCOUNTER — Encounter: Payer: Self-pay | Admitting: Endocrinology

## 2021-02-23 VITALS — BP 116/76 | HR 94 | Ht 72.0 in | Wt 209.4 lb

## 2021-02-23 DIAGNOSIS — K760 Fatty (change of) liver, not elsewhere classified: Secondary | ICD-10-CM

## 2021-02-23 DIAGNOSIS — E1165 Type 2 diabetes mellitus with hyperglycemia: Secondary | ICD-10-CM

## 2021-02-23 DIAGNOSIS — Z794 Long term (current) use of insulin: Secondary | ICD-10-CM | POA: Diagnosis not present

## 2021-02-23 DIAGNOSIS — E782 Mixed hyperlipidemia: Secondary | ICD-10-CM | POA: Diagnosis not present

## 2021-02-23 LAB — MICROALBUMIN / CREATININE URINE RATIO
Creatinine,U: 62.2 mg/dL
Microalb Creat Ratio: 6.8 mg/g (ref 0.0–30.0)
Microalb, Ur: 4.2 mg/dL — ABNORMAL HIGH (ref 0.0–1.9)

## 2021-02-23 MED ORDER — PIOGLITAZONE HCL 30 MG PO TABS: 30.0000 mg | ORAL_TABLET | Freq: Every day | ORAL | 1 refills | Status: DC

## 2021-02-23 NOTE — Progress Notes (Signed)
Gregory May 60 y.o.           Reason for Appointment: follow-up   History of Present Illness   Diagnosis: Type 2 DIABETES MELITUS, date of diagnosis:  1999     Previous history: He has previously been treated with metformin and Victoza and subsequently mealtime insulin added to control postprandial hyperglycemia Overall he has been  relatively noncompliant with his diet, medications, monitoring and followup Previously would not take Victoza regularly because of cost. Has not been taking any medications for the last year and a half because of commitments to family and cost A1c had increased to 12.3% and hyperglycemia discovered when he was hospitalized for foot ulcer. Also lost 20 pounds because of hyperglycemia   For better control, compliance and inconvenience he was switched to the V-go pump on 02/21/16  Recent history:   Insulin regimen:   CURRENTLY: TRESIBA 10 units daily and NovoLog 15-20 units before meals  Non-insulin hypoglycemic drugs: Metformin ER 1500 mg daily , Actos 30 mg daily Jardiance 50m daily    A1c  is about the same at 7.3 Current diabetes management, blood sugar patterns and problems identified.   He has been switched to [place] she can get on the patient resistant program along with his NovoLog now Still taking about the same amount of NovoLog With [place] fasting readings appear to be fairly consistently controlled Has now gone up to 20 mg on his Actos since his last visit He is checking his blood sugars more frequently since his last visit His blood sugars appear to be fairly evenly controlled with only occasional high readings midday He is in the last month or so was not controlling his portions as well Last evening at dinnertime he had a hamburger and a hot dog Highest blood sugar was 186, possibly after eating cereal  Renal function stable with 25 mg Jardiance He is not trying to do any exercise or walking even though he thinks he  can do better now      Dinner 8pm   Side effects from medications: None       Glucometer:  Accu-Chek guide          Blood Glucose readings by download   PRE-MEAL Fasting Lunch Dinner Bedtime Overall  Glucose range: 95-146    83-186  Mean/median: 125  128 134 128   Prior   PRE-MEAL Fasting Lunch Dinner Bedtime Overall  Glucose range:  146-171   114-144   59   Mean/median:  148    127       Meals:  usually 2 meals per day usually.  breakfast at 12 noon, evening meal 7-9 pm, variable snacks late at night    Physical activity: exercise: Minimal               Weight control:   Wt Readings from Last 3 Encounters:  02/23/21 209 lb 6.4 oz (95 kg)  11/24/20 201 lb (91.2 kg)  09/22/20 199 lb 3.2 oz (90.4 kg)          Diabetes labs:  Lab Results  Component Value Date   HGBA1C 7.3 (H) 02/20/2021   HGBA1C 7.5 (H) 11/21/2020   HGBA1C 7.6 (H) 06/21/2020   Lab Results  Component Value Date   MICROALBUR 5.0 (H) 12/03/2019   LDLCALC 65 11/21/2020   CREATININE 0.91 02/20/2021    Lab Results  Component Value Date   FRUCTOSAMINE 245 09/19/2020   FRUCTOSAMINE 272 03/03/2019  FRUCTOSAMINE 243 03/07/2017     Other active problems: See review of systems    Lab on 02/20/2021  Component Date Value Ref Range Status   Sodium 02/20/2021 137  135 - 145 mEq/L Final   Potassium 02/20/2021 4.1  3.5 - 5.1 mEq/L Final   Chloride 02/20/2021 102  96 - 112 mEq/L Final   CO2 02/20/2021 25  19 - 32 mEq/L Final   Glucose, Bld 02/20/2021 134 (A) 70 - 99 mg/dL Final   BUN 02/20/2021 17  6 - 23 mg/dL Final   Creatinine, Ser 02/20/2021 0.91  0.40 - 1.50 mg/dL Final   Total Bilirubin 02/20/2021 0.3  0.2 - 1.2 mg/dL Final   Alkaline Phosphatase 02/20/2021 59  39 - 117 U/L Final   AST 02/20/2021 31  0 - 37 U/L Final   ALT 02/20/2021 37  0 - 53 U/L Final   Total Protein 02/20/2021 7.1  6.0 - 8.3 g/dL Final   Albumin 02/20/2021 4.1  3.5 - 5.2 g/dL Final   GFR 02/20/2021 92.99  >60.00  mL/min Final   Calculated using the CKD-EPI Creatinine Equation (2021)   Calcium 02/20/2021 9.4  8.4 - 10.5 mg/dL Final   Hgb A1c MFr Bld 02/20/2021 7.3 (A) 4.6 - 6.5 % Final   Glycemic Control Guidelines for People with Diabetes:Non Diabetic:  <6%Goal of Therapy: <7%Additional Action Suggested:  >8%      Allergies as of 02/23/2021   No Known Allergies      Medication List        Accurate as of February 23, 2021  3:19 PM. If you have any questions, ask your nurse or doctor.          Accu-Chek FastClix Lancets Misc Use Accu Chek Fastclix lancets to check blood sugar three times daily. DX:E11.65   Accu-Chek Guide Me w/Device Kit 1 each by Does not apply route daily.   Accu-Chek Guide test strip Generic drug: glucose blood TEST BLOOD SUGAR THREE TIMES DAILY AS DIRECTED   acetaminophen 325 MG tablet Commonly known as: TYLENOL Take 650 mg by mouth as needed (pain).   diclofenac Sodium 1 % Gel Commonly known as: Voltaren Apply 2 g topically 4 (four) times daily.   empagliflozin 25 MG Tabs tablet Commonly known as: Jardiance Take 1 tablet (25 mg total) by mouth daily.   fluticasone 50 MCG/ACT nasal spray Commonly known as: FLONASE Use 2 spray(s) in each nostril once daily   gabapentin 300 MG capsule Commonly known as: NEURONTIN TAKE 1 CAPSULE FOUR TIMES DAILY AS NEEDED   insulin aspart 100 UNIT/ML injection Commonly known as: novoLOG Max of 56 units daily in V-Go pump   metFORMIN 750 MG 24 hr tablet Commonly known as: GLUCOPHAGE-XR TAKE 2 TABLETS (1,500 MG TOTAL) BY MOUTH DAILY.   pioglitazone 15 MG tablet Commonly known as: Actos Take 2 tablets daily   pravastatin 80 MG tablet Commonly known as: PRAVACHOL Take 1 tablet (80 mg total) by mouth daily.   Tyler Aas FlexTouch 100 UNIT/ML FlexTouch Pen Generic drug: insulin degludec Inject 12 Units into the skin daily.   V-Go 20 Kit APPLY V-GO 20 PUMP TO ARM ONE TIME DAILY AS DIRECTED        Allergies: No  Known Allergies  Past Medical History:  Diagnosis Date   Arthritis    bilateral hips   Cutaneous abscess of left foot    Deep vein thrombosis (DVT) (HCC)    Diabetes mellitus    type II  Diabetic ulcer of heel ([place])    Right heel   DJD (degenerative joint disease)    DVT (deep venous thrombosis) ([place]) 02/08/2014   Proximal provoked. Date of diagnosis February 08 2014 Duration of anticoagulation: 6 months. End date 08/12/2014.  Anticoagulant: Lovenox 120 units daily Switched to Eliquis on 05/25/2014      Ear drum perforation, right 03/06/2019   Non-pressure chronic ulcer of right calf, limited to breakdown of skin ([place]) 04/17/2017   Pulmonary emboli ([place]) 02/08/2014   Date of diagnosis February 08 2014, on chest CTA Hospitalized for 3 days Had some symptoms of shortness of breath, and chest pain With intercurrent DVT of the left LE. Duration of anticoagulation: 8 months. End date 10/11/2014.  Anticoagulant: Lovenox 120 units daily Switched to Eliquis on 05/25/2014 per patient preference    Pulmonary embolism ([place])    Sebaceous cyst    on back of neck    Past Surgical History:  Procedure Laterality Date   AMPUTATION Right 06/03/2015   Procedure: Right Below Knee Amputation;  Surgeon: [name], MD;  Location: [place];  Service: Orthopedics;  Laterality: Right;   AMPUTATION Left 07/24/2019   Procedure: LEFT FOOT 3RD RAY AMPUTATION;  Surgeon: [name], MD;  Location: [place];  Service: Orthopedics;  Laterality: Left;   CLOSED REDUCTION WITH HUMER PIN INSERTION  1974   left hip   HARDWARE REMOVAL Left 07/21/2014   Procedure: HARDWARE REMOVAL;  Surgeon: Ninetta Lights, MD;  Location: [place];  Service: Orthopedics;  Laterality: Left;   ROTATOR CUFF REPAIR Right 2005 (approx)   TOTAL HIP ARTHROPLASTY Left 07/21/2014   Procedure: TOTAL HIP ARTHROPLASTY ANTERIOR APPROACH;  Surgeon: Ninetta Lights, MD;  Location: [place];  Service: Orthopedics;  Laterality: Left;   TOTAL HIP ARTHROPLASTY Right 2006  (approx)   right hip replaced    Family History  Problem Relation Age of Onset   Breast cancer Mother    Cancer Mother        small intestine   Liver cancer Mother    Diabetes Mother    Diabetes Father    Diabetes Brother    Hypertension Maternal Grandmother    Heart Problems Maternal Grandmother    Diabetes Paternal Grandmother    Diabetes Paternal Grandfather    Diabetes Brother     Social History:  reports that he has never smoked. He has never used smokeless tobacco. He reports current alcohol use. He reports that he does not use drugs.  Review of Systems:   Lipids: He is on Pravachol 80 mg  LDL is more recently better controlled with taking pravastatin consistently   Lab Results  Component Value Date   CHOL 144 11/21/2020   CHOL 151 06/21/2020   CHOL 210 (H) 03/07/2020   Lab Results  Component Value Date   HDL 58.90 11/21/2020   HDL 43.10 06/21/2020   HDL 43.80 03/07/2020   Lab Results  Component Value Date   LDLCALC 65 11/21/2020   LDLCALC 78 06/21/2020   LDLCALC 145 (H) 03/07/2020   Lab Results  Component Value Date   TRIG 102.0 11/21/2020   TRIG 147.0 06/21/2020   TRIG 107.0 03/07/2020   Lab Results  Component Value Date   CHOLHDL 2 11/21/2020   CHOLHDL 3 06/21/2020   CHOLHDL 5 03/07/2020   Lab Results  Component Value Date   LDLDIRECT 91.0 07/11/2015    Has history of abnormal liver functions chronically Previously had not improved with stopping  pravastatin  With starting Actos in 06/2020 his liver functions have improved With increasing the dose to 30 mg his AST is back to normal compared to last visit  He has a fatty liver which was seen on his ultrasound  Lab Results  Component Value Date   ALT 37 02/20/2021   ALT 49 11/21/2020   ALT 40 09/19/2020   ALT 61 (H) 06/21/2020   ALT 69 (H) 05/09/2020     HYPERTENSION: Still not requiring pharmacological treatment Controlled with Jardiance Blood pressure is high normal  occasionally   BP Readings from Last 3 Encounters:  02/23/21 116/76  11/24/20 124/80  09/22/20 138/84   Renal function normal Microalbumin normal as of 4/21  Lab Results  Component Value Date   CREATININE 0.91 02/20/2021   CREATININE 0.95 11/21/2020   CREATININE 0.97 09/19/2020   NEUROPATHY:  He has burning in the left foot along with discomfort, paresthesia, some numbness and difficulty using his right hand Treated with gabapentin     Examination:   BP 116/76   Pulse 94   Ht 6' (1.829 m)   Wt 209 lb 6.4 oz (95 kg)   SpO2 100%   BMI 28.40 kg/m   Body mass index is 28.4 kg/m.   ASSESSMENT/ PLAN:     Diabetes type 2 insulin-dependent:  See history of present illness for detailed discussion of current diabetes management, blood sugar patterns and problems identified  A1c is about the same at 7.3 He is on [place] and NovoLog along with Jardiance, 30 mg Actos and Metformin As before he is requiring only 10 units of basal insulin but as much as 20 units of mealtime coverage However at home his blood sugars are only averaging 128 including some readings at all different times and not over 200  Weight gain: This may be from increasing Actos but he thinks he can cut back on his portions at meals also Also recommended starting a walking program since he cannot tolerate this now  Abnormal liver functions: Liver functions are improved with Actos 30 mg Continue 30 mg Actos but consider reducing the dose if he has further weight gain  Microalbumin normal  Follow-up in 3 months   There are no Patient Instructions on file for this visit.    Elayne Snare 02/23/2021, 3:19 PM   Note: This office note was prepared with Dragon voice recognition system technology. Any transcriptional errors that result from this process are unintentional.

## 2021-02-23 NOTE — Patient Instructions (Signed)
Walk daily 

## 2021-03-07 ENCOUNTER — Telehealth: Payer: Self-pay | Admitting: Endocrinology

## 2021-03-07 NOTE — Telephone Encounter (Signed)
Pt called stating we should have gotten a form from someone in [facility] ([place]) for pt's patient assistance for his Novolog. We didn't write the instructions so we will need to complete the dosage instructions on the form and fax back to [facility].

## 2021-03-07 NOTE — Telephone Encounter (Signed)
Received and fax has been sent to [facility] today.

## 2021-04-18 ENCOUNTER — Ambulatory Visit: Payer: Self-pay

## 2021-04-18 ENCOUNTER — Ambulatory Visit (INDEPENDENT_AMBULATORY_CARE_PROVIDER_SITE_OTHER): Payer: Medicare HMO | Admitting: Family

## 2021-04-18 ENCOUNTER — Encounter: Payer: Self-pay | Admitting: Family

## 2021-04-18 ENCOUNTER — Other Ambulatory Visit: Payer: Self-pay

## 2021-04-18 DIAGNOSIS — L03032 Cellulitis of left toe: Secondary | ICD-10-CM

## 2021-04-18 DIAGNOSIS — Z89422 Acquired absence of other left toe(s): Secondary | ICD-10-CM

## 2021-04-18 MED ORDER — CEPHALEXIN 500 MG PO CAPS: 500.0000 mg | ORAL_CAPSULE | Freq: Three times a day (TID) | ORAL | 0 refills | Status: DC

## 2021-04-18 NOTE — Progress Notes (Signed)
Office Visit Note   Patient: Gregory May           Date of Birth: 1961-03-18           MRN: [id number] Visit Date: 04/18/2021              Requested by: [name], MD [address] [place],  [place] [address] PCP: [name], MD  No chief complaint on file.     HPI: The patient is a 60 year old gentleman seen today for evaluation of new wound to his left toe.  He status post third ray amputation.  He has been having some redness some drainage  Assessment & Plan: Visit Diagnoses: No diagnosis found.  Plan: We will place on antibiotics follow-up in 1 week.  Discussed importance of keeping the wound clean and dry.  Minimizing weightbearing.  He will follow-up closely. call for any worsening  Follow-Up Instructions: No follow-ups on file.   Ortho Exam  Patient is alert, oriented, no adenopathy, well-dressed, normal affect, normal respiratory effort. On examination of the left foot there is open ulceration with serosanguineous drainage.  There is no purulence there is surrounding erythema and warmth.  There is no ascending cellulitis  Imaging: No results found. No images are attached to the encounter.  Labs: Lab Results  Component Value Date   HGBA1C 7.3 (H) 02/20/2021   HGBA1C 7.5 (H) 11/21/2020   HGBA1C 7.6 (H) 06/21/2020   ESRSEDRATE 8 07/15/2019   ESRSEDRATE 56 (H) 06/01/2015   CRP 48 (H) 07/15/2019   REPTSTATUS 07/29/2019 FINAL 07/24/2019   GRAMSTAIN  07/24/2019    RARE WBC PRESENT, PREDOMINANTLY MONONUCLEAR NO ORGANISMS SEEN    CULT  07/24/2019    RARE STAPHYLOCOCCUS CAPRAE NO ANAEROBES ISOLATED Performed at [facility], [address], [place], [place] [address]    [facility] STAPHYLOCOCCUS CAPRAE 07/24/2019     Lab Results  Component Value Date   ALBUMIN 4.1 02/20/2021   ALBUMIN 4.2 11/21/2020   ALBUMIN 4.3 09/19/2020    No results found for: MG Lab Results  Component Value Date   VD25OH 16 (L) 11/05/2013    No  results found for: PREALBUMIN CBC EXTENDED Latest Ref Rng & Units 06/02/2020 07/15/2019 06/07/2015  WBC 4.0 - 10.5 K/uL 8.5 11.8(H) 13.2(H)  RBC 4.22 - 5.81 MIL/uL 5.48 5.23 4.57  HGB 13.0 - 17.0 g/dL 02.7 74.1 28.7  HCT 86.7 - 52.0 % 47.2 47.4 41.7  PLT 150 - 400 K/uL 357 383 435(H)  NEUTROABS 1.7 - 7.7 K/uL 5.6 8.2(H) 8.9(H)  LYMPHSABS 0.7 - 4.0 K/uL 1.9 2.2 2.5     There is no height or weight on file to calculate BMI.  Orders:  No orders of the defined types were placed in this encounter.  No orders of the defined types were placed in this encounter.    Procedures: No procedures performed  Clinical Data: No additional findings.  ROS:  All other systems negative, except as noted in the HPI. Review of Systems  Objective: Vital Signs: There were no vitals taken for this visit.  Specialty Comments:  No specialty comments available.  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/2017   Carpal tunnel syndrome 03/18/2017   Colon cancer screening 06/21/2015   Status post below knee amputation of right lower extremity (HCC) 06/07/2015   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) 06/01/2015   Diabetic polyneuropathy associated with type 2 diabetes mellitus (HCC) 12/09/2014   Diabetes mellitus with carpal tunnel syndrome (HCC) 12/09/2014   DJD (degenerative joint disease) of knee 07/21/2014   Osteoarthritis, hip, bilateral 05/25/2014   Onychomycosis 10/14/2013    Pure hypercholesterolemia 09/25/2013   Diabetic foot ulcer (HCC) 09/15/2013   Poorly controlled type 2 diabetes mellitus with complication (HCC) 09/15/1997   Past Medical History:  Diagnosis Date   Arthritis    bilateral hips   Cutaneous abscess of left foot    Deep vein thrombosis (DVT) (HCC)    Diabetes mellitus    type II   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 Date of diagnosis February 08 2014 Duration of anticoagulation: 6 months. End date 08/12/2014.  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 Duration of anticoagulation: 8 months. End date 10/11/2014.  Anticoagulant: Lovenox 120 units daily Switched to Eliquis on 05/25/2014 per patient preference    Pulmonary embolism (HCC)    Sebaceous cyst    on back of neck    Family History  Problem Relation Age of Onset   Breast cancer Mother    Cancer Mother        small intestine   Liver cancer Mother    Diabetes Mother    Diabetes Father    Diabetes Brother    Hypertension Maternal Grandmother    Heart Problems Maternal Grandmother    Diabetes Paternal Grandmother    Diabetes Paternal Grandfather    Diabetes Brother     Past Surgical History:  Procedure Laterality Date   AMPUTATION Right 06/03/2015   Procedure: Right Below Knee Amputation;  Surgeon: Nadara Mustard, MD;  Location: [facility];  Service: Orthopedics;  Laterality: Right;   AMPUTATION Left 07/24/2019   Procedure: LEFT FOOT 3RD RAY AMPUTATION;  Surgeon: Nadara Mustard, MD;  Location[facility];  Service:  Orthopedics;  Laterality: Left;   CLOSED REDUCTION WITH HUMER PIN INSERTION  1974   left hip   HARDWARE REMOVAL Left 07/21/2014   Procedure: HARDWARE REMOVAL;  Surgeon: Loreta Ave, MD;  Location: [facility];  Service: Orthopedics;  Laterality: Left;   ROTATOR CUFF REPAIR Right 2005 (approx)   TOTAL HIP ARTHROPLASTY Left 07/21/2014   Procedure: TOTAL HIP ARTHROPLASTY ANTERIOR APPROACH;  Surgeon: Loreta Ave, MD;  Location: [facility] OR;  Service: Orthopedics;  Laterality: Left;   TOTAL HIP ARTHROPLASTY Right 2006 (approx)   right hip replaced   Social History   Occupational History   Occupation: disabled  Tobacco Use   Smoking status: Never   Smokeless tobacco: Never  Vaping Use   Vaping Use: Never used  Substance and Sexual Activity   Alcohol use: Yes    Alcohol/week: 0.0 standard drinks    Comment: beer and mixed drink maybe 5  times a month   Drug use: No   Sexual activity: Never

## 2021-04-28 ENCOUNTER — Telehealth: Payer: Self-pay | Admitting: Family

## 2021-04-28 ENCOUNTER — Other Ambulatory Visit: Payer: Self-pay

## 2021-04-28 MED ORDER — CEPHALEXIN 500 MG PO CAPS: 500.0000 mg | ORAL_CAPSULE | Freq: Three times a day (TID) | ORAL | 0 refills | Status: DC

## 2021-04-28 NOTE — Telephone Encounter (Signed)
This pt called in follow up to a small ulcer on his left foot. Denny Peon saw this pt in the clinic last week and started ABX keflex 500 my tid x 10 days. The pt has an appt on Tuesday offered to move up to Monday and he declined. The wound is " getting smaller" but wanted a refill on his abx to get him through until his appt. Ok per Dr. Lajoyce Corners for refill advised that I will send this to pharm. In ordering the rx I mistakenly selected another "[facility]" as the ordering provider. The rx has been called into the pharmacy with the correct information but I do not have the clearance to remove the rx from the chart. Can you please help with this?

## 2021-04-28 NOTE — Telephone Encounter (Signed)
Pt called requesting a call back about his wound and he is also asking for a refill of antibiotics. Please call pt about this matter at [phone].

## 2021-04-28 NOTE — Telephone Encounter (Signed)
I discontinued prescription entered in error. Resubmitted correctly to pharmacy.  Autumn F called pharmacy to clarify

## 2021-05-01 ENCOUNTER — Other Ambulatory Visit: Payer: Self-pay | Admitting: Endocrinology

## 2021-05-01 ENCOUNTER — Telehealth: Payer: Self-pay

## 2021-05-01 NOTE — Telephone Encounter (Signed)
Please refill metformin 750mg s extended release per pharmacy. [phone]

## 2021-05-02 ENCOUNTER — Ambulatory Visit (INDEPENDENT_AMBULATORY_CARE_PROVIDER_SITE_OTHER): Payer: Medicare HMO | Admitting: Family

## 2021-05-02 ENCOUNTER — Encounter: Payer: Self-pay | Admitting: Family

## 2021-05-02 DIAGNOSIS — L97521 Non-pressure chronic ulcer of other part of left foot limited to breakdown of skin: Secondary | ICD-10-CM | POA: Diagnosis not present

## 2021-05-02 NOTE — Progress Notes (Signed)
Office Visit Note   Patient: Gregory May           Date of Birth: [birth year]           MRN: [id number] Visit Date: 05/02/2021              Requested by: [name], MD [address] [place],  [place] [address] PCP: [name], MD  Chief Complaint  Patient presents with   Left Foot - Pain      HPI: The patient is a 60 year old gentleman seen today in follow-up for ulcer to his left second toe.  He has an ulcer over his IP joint from rubbing and old shoewear he feels this is much improved.  He continues on antibiotics doing Band-Aid dressing changes  Assessment & Plan: Visit Diagnoses: No diagnosis found.  Plan: Ulcer is stable.  He will do Silvadene dressing changes daily discussed the importance of wide toe box shoe wear that fits appropriately.  He may resume his regular shoewear he will follow-up in 2 weeks  Follow-Up Instructions: No follow-ups on file.   Ortho Exam  Patient is alert, oriented, no adenopathy, well-dressed, normal affect, normal respiratory effort. On examination of the left foot he does have a bounding dorsalis pedis pulse.  There is ulceration 8 mm in diameter over the IP joint of the left second toe he there is no exposed bone or tendon there is fibrinous tissue in the wound bed.  No erythema no warmth no drainage. he does have thickened and discolored onychomycotic nails x4 these were trimmed today without incident as the patient is unable to safely trim his own nails due to neuropathy  Imaging: No results found. No images are attached to the encounter.  Labs: Lab Results  Component Value Date   HGBA1C 7.3 (H) 02/20/2021   HGBA1C 7.5 (H) 11/21/2020   HGBA1C 7.6 (H) 06/21/2020   ESRSEDRATE 8 07/15/2019   ESRSEDRATE 56 (H) 06/01/2015   CRP 48 (H) 07/15/2019   REPTSTATUS 07/29/2019 FINAL 07/24/2019   GRAMSTAIN  07/24/2019    RARE WBC PRESENT, PREDOMINANTLY MONONUCLEAR NO ORGANISMS SEEN    CULT  07/24/2019    RARE  STAPHYLOCOCCUS CAPRAE NO ANAEROBES ISOLATED Performed at [facility], [address], [place], [place] [address]    [facility] STAPHYLOCOCCUS CAPRAE 07/24/2019     Lab Results  Component Value Date   ALBUMIN 4.1 02/20/2021   ALBUMIN 4.2 11/21/2020   ALBUMIN 4.3 09/19/2020    No results found for: MG Lab Results  Component Value Date   VD25OH 16 (L) 11/05/2013    No results found for: PREALBUMIN CBC EXTENDED Latest Ref Rng & Units 06/02/2020 07/15/2019 06/07/2015  WBC 4.0 - 10.5 K/uL 8.5 11.8(H) 13.2(H)  RBC 4.22 - 5.81 MIL/uL 5.48 5.23 4.57  HGB 13.0 - 17.0 g/dL 36.6 29.4 76.5  HCT 46.5 - 52.0 % 47.2 47.4 41.7  PLT 150 - 400 K/uL 357 383 435(H)  NEUTROABS 1.7 - 7.7 K/uL 5.6 8.2(H) 8.9(H)  LYMPHSABS 0.7 - 4.0 K/uL 1.9 2.2 2.5     There is no height or weight on file to calculate BMI.  Orders:  No orders of the defined types were placed in this encounter.  No orders of the defined types were placed in this encounter.    Procedures: No procedures performed  Clinical Data: No additional findings.  ROS:  All other systems negative, except as noted in the HPI. Review of Systems  Constitutional:  Negative for chills and fever.  Cardiovascular:  Negative for leg swelling.  Skin:  Positive for wound. Negative for color change and rash.   Objective: Vital Signs: There were no vitals taken for this visit.  Specialty Comments:  No specialty comments available.  PMFS History: Patient Active Problem List   Diagnosis Date Noted   Hepatic steatosis 06/24/2020   Back pain 04/20/2020   Flu vaccine need 04/20/2020   COVID-19 virus vaccination declined 04/20/2020   Left knee pain 04/20/2020   Allergic sinusitis 04/20/2020   Elevated liver enzymes 04/20/2020   Diastasis of rectus abdominis 07/16/2019   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 10/07/2018   Essential hypertension 06/19/2018   Peripheral neuropathy  12/19/2017   Acquired absence of right leg below knee (HCC) 04/17/2017   Carpal tunnel syndrome 03/18/2017   Colon cancer screening 06/21/2015   Status post below knee amputation of right lower extremity (HCC) 06/07/2015   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) 06/01/2015   Diabetic polyneuropathy associated with type 2 diabetes mellitus (HCC) 12/09/2014   Diabetes mellitus with carpal tunnel syndrome (HCC) 12/09/2014   DJD (degenerative joint disease) of knee 07/21/2014   Osteoarthritis, hip, bilateral 05/25/2014   Onychomycosis 10/14/2013   Pure hypercholesterolemia 09/25/2013   Diabetic foot ulcer (HCC) 09/15/2013   Poorly controlled type 2 diabetes mellitus with complication (HCC) 09/15/1997   Past Medical History:  Diagnosis Date   Arthritis    bilateral hips   Cutaneous abscess of left foot    Deep vein thrombosis (DVT) (HCC)    Diabetes mellitus    type II   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 Date of diagnosis February 08 2014 Duration of anticoagulation: 6 months. End date 08/12/2014.  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 Duration of anticoagulation: 8 months. End date 10/11/2014.  Anticoagulant: Lovenox 120 units daily Switched to Eliquis on 05/25/2014 per patient preference    Pulmonary embolism (HCC)    Sebaceous cyst    on back of neck    Family History  Problem Relation Age of Onset   Breast cancer Mother    Cancer Mother        small intestine   Liver cancer Mother    Diabetes Mother     Diabetes Father    Diabetes Brother    Hypertension Maternal Grandmother    Heart Problems Maternal Grandmother    Diabetes Paternal Grandmother    Diabetes Paternal Grandfather    Diabetes Brother     Past Surgical History:  Procedure Laterality Date   AMPUTATION Right 06/03/2015   Procedure: Right Below Knee Amputation;  Surgeon: Nadara Mustard, MD;  Location: [facility] OR;  Service: Orthopedics;  Laterality: Right;   AMPUTATION Left 07/24/2019   Procedure: LEFT FOOT 3RD RAY AMPUTATION;  Surgeon: Nadara Mustard, MD;  Location: [facility] OR;  Service: Orthopedics;  Laterality: Left;   CLOSED REDUCTION WITH HUMER PIN INSERTION  1974  left hip   HARDWARE REMOVAL Left 07/21/2014   Procedure: HARDWARE REMOVAL;  Surgeon: Loreta Ave, MD;  Location: [facility] OR;  Service: Orthopedics;  Laterality: Left;   ROTATOR CUFF REPAIR Right 2005 (approx)   TOTAL HIP ARTHROPLASTY Left 07/21/2014   Procedure: TOTAL HIP ARTHROPLASTY ANTERIOR APPROACH;  Surgeon: Loreta Ave, MD;  Location: [facility] OR;  Service: Orthopedics;  Laterality: Left;   TOTAL HIP ARTHROPLASTY Right 2006 (approx)   right hip replaced   Social History   Occupational History   Occupation: disabled  Tobacco Use   Smoking status: Never   Smokeless tobacco: Never  Vaping Use   Vaping Use: Never used  Substance and Sexual Activity   Alcohol use: Yes    Alcohol/week: 0.0 standard drinks    Comment: beer and mixed drink maybe 5  times a month   Drug use: No   Sexual activity: Never

## 2021-05-04 ENCOUNTER — Other Ambulatory Visit: Payer: Self-pay | Admitting: Endocrinology

## 2021-05-04 NOTE — Telephone Encounter (Signed)
Patient called and requested a 7 day gap RX for Metformin 750 MG ER to be sent to his local pharmacy [facility] [address].  Patient advises that his mail order refill was shipped today by the mail order pharmacy but he was advised that he would probably not get the medication until Monday.

## 2021-05-05 ENCOUNTER — Other Ambulatory Visit: Payer: Self-pay | Admitting: Internal Medicine

## 2021-05-05 DIAGNOSIS — J309 Allergic rhinitis, unspecified: Secondary | ICD-10-CM

## 2021-05-05 NOTE — Telephone Encounter (Signed)
Pt would like a weeks worth Metformin ER 750mg  2xday. Waiting for mail order to arrive. Send script to . Any questions pt number: [phone]

## 2021-05-05 NOTE — Telephone Encounter (Signed)
Patient called to check status of gap RX for Metformin

## 2021-05-16 ENCOUNTER — Telehealth: Payer: Self-pay | Admitting: Endocrinology

## 2021-05-16 ENCOUNTER — Encounter: Payer: Self-pay | Admitting: Orthopedic Surgery

## 2021-05-16 ENCOUNTER — Ambulatory Visit (INDEPENDENT_AMBULATORY_CARE_PROVIDER_SITE_OTHER): Payer: Medicare HMO | Admitting: Orthopedic Surgery

## 2021-05-16 DIAGNOSIS — L97421 Non-pressure chronic ulcer of left heel and midfoot limited to breakdown of skin: Secondary | ICD-10-CM

## 2021-05-16 DIAGNOSIS — L97521 Non-pressure chronic ulcer of other part of left foot limited to breakdown of skin: Secondary | ICD-10-CM

## 2021-05-16 DIAGNOSIS — Z794 Long term (current) use of insulin: Secondary | ICD-10-CM

## 2021-05-16 DIAGNOSIS — E1165 Type 2 diabetes mellitus with hyperglycemia: Secondary | ICD-10-CM

## 2021-05-16 DIAGNOSIS — Z89511 Acquired absence of right leg below knee: Secondary | ICD-10-CM | POA: Diagnosis not present

## 2021-05-16 DIAGNOSIS — S88111A Complete traumatic amputation at level between knee and ankle, right lower leg, initial encounter: Secondary | ICD-10-CM

## 2021-05-16 MED ORDER — INSULIN SYRINGES (DISPOSABLE) U-100 0.5 ML MISC: 2 refills | Status: AC

## 2021-05-16 NOTE — Telephone Encounter (Signed)
Pt calling in to get a refill of syringes for insulin use. [facility] on [address] and [place]. Pt contact [phone]

## 2021-05-16 NOTE — Progress Notes (Signed)
Office Visit Note   Patient: Gregory May           Date of Birth: 02/07/1961           MRN: [id number] Visit Date: 05/16/2021              Requested by: [name], MD [address] [place],  [place] [address] PCP: [name], MD  Chief Complaint  Patient presents with   Left Foot - Wound Check    Follow up ulcer of left 2nd toe      HPI: Patient is a 60 year old gentleman who presents for 2 separate issues.  #1 he is status post a right transtibial amputation which she states is doing well.  #2 he has a left heel decubitus ulcer which she is concerned may have gotten worse.  Assessment & Plan: Visit Diagnoses:  1. Ulcer of toe of left foot, limited to breakdown of skin (HCC)   2. Below-knee amputation of right lower extremity (HCC)     Plan: Patient is given a prescription for [employer] for his prosthesis he will resume wearing his extra-depth shoe wear for the left foot.  Keep an eye on the second toe and left heel.  Follow-Up Instructions: Return in about 4 weeks (around 06/13/2021).   Ortho Exam  Patient is alert, oriented, no adenopathy, well-dressed, normal affect, normal respiratory effort. Examination patient has a well-healed right transtibial amputation.  Patient has a fixed clawing of the second toe left foot with a healing ulcer over the PIP joint there is no exposed bone or tendon there is no sausage digit swelling no cellulitis no drainage.  Band-Aids was applied and patient will need an extra-depth shoe to protect this from his shoe wear.  He has a ulcer on the left heel.  After informed consent a 10 blade knife was used to debride the skin and soft tissue back to healthy viable granulation tissue after debridement the ulcer is 2 cm in diameter 1 mm deep there is no exposed bone or tendon there is good granulation tissue no signs of infection.  Imaging: No results found. No images are attached to the encounter.  Labs: Lab Results   Component Value Date   HGBA1C 7.3 (H) 02/20/2021   HGBA1C 7.5 (H) 11/21/2020   HGBA1C 7.6 (H) 06/21/2020   ESRSEDRATE 8 07/15/2019   ESRSEDRATE 56 (H) 06/01/2015   CRP 48 (H) 07/15/2019   REPTSTATUS 07/29/2019 FINAL 07/24/2019   GRAMSTAIN  07/24/2019    RARE WBC PRESENT, PREDOMINANTLY MONONUCLEAR NO ORGANISMS SEEN    CULT  07/24/2019    RARE STAPHYLOCOCCUS CAPRAE NO ANAEROBES ISOLATED Performed at [facility], [address], [place], [place] [address]    [facility] STAPHYLOCOCCUS CAPRAE 07/24/2019     Lab Results  Component Value Date   ALBUMIN 4.1 02/20/2021   ALBUMIN 4.2 11/21/2020   ALBUMIN 4.3 09/19/2020    No results found for: MG Lab Results  Component Value Date   VD25OH 16 (L) 11/05/2013    No results found for: PREALBUMIN CBC EXTENDED Latest Ref Rng & Units 06/02/2020 07/15/2019 06/07/2015  WBC 4.0 - 10.5 K/uL 8.5 11.8(H) 13.2(H)  RBC 4.22 - 5.81 MIL/uL 5.48 5.23 4.57  HGB 13.0 - 17.0 g/dL 53.6 64.4 03.4  HCT 74.2 - 52.0 % 47.2 47.4 41.7  PLT 150 - 400 K/uL 357 383 435(H)  NEUTROABS 1.7 - 7.7 K/uL 5.6 8.2(H) 8.9(H)  LYMPHSABS 0.7 - 4.0 K/uL 1.9 2.2 2.5  There is no height or weight on file to calculate BMI.  Orders:  No orders of the defined types were placed in this encounter.  No orders of the defined types were placed in this encounter.    Procedures: No procedures performed  Clinical Data: No additional findings.  ROS:  All other systems negative, except as noted in the HPI. Review of Systems  Objective: Vital Signs: There were no vitals taken for this visit.  Specialty Comments:  No specialty comments available.  PMFS History: Patient Active Problem List   Diagnosis Date Noted   Hepatic steatosis 06/24/2020   Back pain 04/20/2020   Flu vaccine need 04/20/2020   COVID-19 virus vaccination declined 04/20/2020   Left knee pain 04/20/2020   Allergic sinusitis 04/20/2020   Elevated liver enzymes 04/20/2020   Diastasis  of rectus abdominis 07/16/2019   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 10/07/2018   Essential hypertension 06/19/2018   Peripheral neuropathy 12/19/2017   Acquired absence of right leg below knee (HCC) 04/17/2017   Carpal tunnel syndrome 03/18/2017   Colon cancer screening 06/21/2015   Status post below knee amputation of right lower extremity (HCC) 06/07/2015   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) 06/01/2015   Diabetic polyneuropathy associated with type 2 diabetes mellitus (HCC) 12/09/2014   Diabetes mellitus with carpal tunnel syndrome (HCC) 12/09/2014   DJD (degenerative joint disease) of knee 07/21/2014   Osteoarthritis, hip, bilateral 05/25/2014   Onychomycosis 10/14/2013   Pure hypercholesterolemia 09/25/2013   Diabetic foot ulcer (HCC) 09/15/2013   Poorly controlled type 2 diabetes mellitus with complication (HCC) 09/15/1997   Past Medical History:  Diagnosis Date   Arthritis    bilateral hips   Cutaneous abscess of left foot    Deep vein thrombosis (DVT) (HCC)    Diabetes mellitus    type II   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 Date of diagnosis February 08 2014 Duration of anticoagulation: 6 months. End date 08/12/2014.  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 Duration of anticoagulation: 8 months. End date 10/11/2014.  Anticoagulant: Lovenox 120 units daily Switched to Eliquis on 05/25/2014 per patient preference    Pulmonary embolism (HCC)    Sebaceous  cyst    on back of neck    Family History  Problem Relation Age of Onset   Breast cancer Mother    Cancer Mother        small intestine   Liver cancer Mother    Diabetes Mother    Diabetes Father    Diabetes Brother    Hypertension Maternal Grandmother    Heart Problems Maternal Grandmother    Diabetes Paternal Grandmother    Diabetes Paternal Grandfather    Diabetes Brother     Past Surgical History:  Procedure Laterality Date   AMPUTATION Right 06/03/2015   Procedure: Right Below Knee Amputation;  Surgeon: Nadara Mustard, MD;  Location: [facility] OR;  Service: Orthopedics;  Laterality:  Right;   AMPUTATION Left 07/24/2019   Procedure: LEFT FOOT 3RD RAY AMPUTATION;  Surgeon: Nadara Mustard, MD;  Location: [facility] OR;  Service: Orthopedics;  Laterality: Left;   CLOSED REDUCTION WITH HUMER PIN INSERTION  1974   left hip   HARDWARE REMOVAL Left 07/21/2014   Procedure: HARDWARE REMOVAL;  Surgeon: Loreta Ave, MD;  Location: [facility] OR;  Service: Orthopedics;  Laterality: Left;   ROTATOR CUFF REPAIR Right 2005 (approx)   TOTAL HIP ARTHROPLASTY Left 07/21/2014   Procedure: TOTAL HIP ARTHROPLASTY ANTERIOR APPROACH;  Surgeon: Loreta Ave, MD;  Location: [facility] OR;  Service: Orthopedics;  Laterality: Left;   TOTAL HIP ARTHROPLASTY Right 2006 (approx)   right hip replaced   Social History   Occupational History   Occupation: disabled  Tobacco Use   Smoking status: Never   Smokeless tobacco: Never  Vaping Use   Vaping Use: Never used  Substance and Sexual Activity   Alcohol use: Yes    Alcohol/week: 0.0 standard drinks    Comment: beer and mixed drink maybe 5  times a month   Drug use: No   Sexual activity: Never

## 2021-05-16 NOTE — Telephone Encounter (Signed)
Syringes sent to preferred pharmacy

## 2021-05-23 ENCOUNTER — Other Ambulatory Visit: Payer: Self-pay

## 2021-05-23 ENCOUNTER — Other Ambulatory Visit (INDEPENDENT_AMBULATORY_CARE_PROVIDER_SITE_OTHER): Payer: Medicare HMO

## 2021-05-23 DIAGNOSIS — E1165 Type 2 diabetes mellitus with hyperglycemia: Secondary | ICD-10-CM | POA: Diagnosis not present

## 2021-05-23 DIAGNOSIS — Z794 Long term (current) use of insulin: Secondary | ICD-10-CM

## 2021-05-23 DIAGNOSIS — E782 Mixed hyperlipidemia: Secondary | ICD-10-CM | POA: Diagnosis not present

## 2021-05-23 LAB — COMPREHENSIVE METABOLIC PANEL
ALT: 40 U/L (ref 0–53)
AST: 41 U/L — ABNORMAL HIGH (ref 0–37)
Albumin: 4.5 g/dL (ref 3.5–5.2)
Alkaline Phosphatase: 57 U/L (ref 39–117)
BUN: 19 mg/dL (ref 6–23)
CO2: 27 mEq/L (ref 19–32)
Calcium: 9.8 mg/dL (ref 8.4–10.5)
Chloride: 100 mEq/L (ref 96–112)
Creatinine, Ser: 1.04 mg/dL (ref 0.40–1.50)
GFR: 79.08 mL/min (ref >60.00)
Glucose, Bld: 64 mg/dL — ABNORMAL LOW (ref 70–99)
Potassium: 3.9 mEq/L (ref 3.5–5.1)
Sodium: 137 mEq/L (ref 135–145)
Total Bilirubin: 0.3 mg/dL (ref 0.2–1.2)
Total Protein: 7.9 g/dL (ref 6.0–8.3)

## 2021-05-23 LAB — LIPID PANEL
Cholesterol: 155 mg/dL (ref 0–200)
HDL: 58.4 mg/dL (ref >39.00)
LDL Cholesterol: 77 mg/dL (ref 0–99)
NonHDL: 96.11
Total CHOL/HDL Ratio: 3
Triglycerides: 98 mg/dL (ref 0.0–149.0)
VLDL: 19.6 mg/dL (ref 0.0–40.0)

## 2021-05-23 LAB — HEMOGLOBIN A1C: Hgb A1c MFr Bld: 7.5 % — ABNORMAL HIGH (ref 4.6–6.5)

## 2021-05-24 ENCOUNTER — Other Ambulatory Visit: Payer: Self-pay | Admitting: Endocrinology

## 2021-05-29 ENCOUNTER — Encounter: Payer: Self-pay | Admitting: Endocrinology

## 2021-05-29 ENCOUNTER — Other Ambulatory Visit: Payer: Self-pay

## 2021-05-29 ENCOUNTER — Ambulatory Visit (INDEPENDENT_AMBULATORY_CARE_PROVIDER_SITE_OTHER): Payer: Medicare HMO | Admitting: Endocrinology

## 2021-05-29 VITALS — BP 118/72 | HR 63 | Ht 72.0 in | Wt 211.0 lb

## 2021-05-29 DIAGNOSIS — E1142 Type 2 diabetes mellitus with diabetic polyneuropathy: Secondary | ICD-10-CM | POA: Diagnosis not present

## 2021-05-29 DIAGNOSIS — Z23 Encounter for immunization: Secondary | ICD-10-CM | POA: Diagnosis not present

## 2021-05-29 DIAGNOSIS — Z794 Long term (current) use of insulin: Secondary | ICD-10-CM

## 2021-05-29 DIAGNOSIS — R7989 Other specified abnormal findings of blood chemistry: Secondary | ICD-10-CM | POA: Diagnosis not present

## 2021-05-29 DIAGNOSIS — E1165 Type 2 diabetes mellitus with hyperglycemia: Secondary | ICD-10-CM | POA: Diagnosis not present

## 2021-05-29 NOTE — Progress Notes (Signed)
Gregory May 60 y.o.           Reason for Appointment: follow-up   History of Present Illness   Diagnosis: Type 2 DIABETES MELITUS, date of diagnosis:  1999     Previous history: He has previously been treated with metformin and Victoza and subsequently mealtime insulin added to control postprandial hyperglycemia Overall he has been  relatively noncompliant with his diet, medications, monitoring and followup Previously would not take Victoza regularly because of cost. Has not been taking any medications for the last year and a half because of commitments to family and cost A1c had increased to 12.3% and hyperglycemia discovered when he was hospitalized for foot ulcer. Also lost 20 pounds because of hyperglycemia   For better control, compliance and inconvenience he was switched to the V-go pump on 02/21/16  Recent history:   Insulin regimen:   CURRENTLY: TRESIBA 10 units daily and NovoLog 18-20 units before meals  Non-insulin hypoglycemic drugs: Metformin ER 1500 mg daily , Actos 30 mg daily Jardiance 58m daily    A1c  is about the same at 7.5  Current diabetes management, blood sugar patterns and problems identified.  He has somewhat variable readings in the mornings but usually dependent on his snacks overnight  Usually likes to eat some cookies during the night and if he skips this his blood sugar will be normal fasting  He has done some readings at different times in the afternoon and evenings but infrequently after dinner  However postprandial readings are generally well controlled with about the same amount of insulin at mealtimes  He thinks he is trying to eat healthy meals but his weight is about the same  Mostly eating 2 meals a day Given with 10 units of Tresiba his fasting readings are averaging about 139  No hypoglycemia Highest blood sugar was 196 right after dinner Renal function stable with 25 mg Jardiance  Has breakfast around 10-11 AM and dinner at 8  PM   Side effects from medications: None       Glucometer:  Accu-Chek guide          Blood Glucose readings by download   PRE-MEAL Fasting Lunch Dinner Bedtime Overall  Glucose range: 94-196      Mean/median: 139  145 133 139   POST-MEAL PC Breakfast PC Lunch PC Dinner  Glucose range:     Mean/median:       PRE-MEAL Fasting Lunch Dinner Bedtime Overall  Glucose range: 95-146    83-186  Mean/median: 125  128 134 128     Meals:  usually 2 meals per day usually.  breakfast at 12 noon, evening meal 7-9 pm, variable snacks late at night    Physical activity: exercise: Minimal               Weight control:   Wt Readings from Last 3 Encounters:  05/29/21 211 lb (95.7 kg)  02/23/21 209 lb 6.4 oz (95 kg)  11/24/20 201 lb (91.2 kg)          Diabetes labs:  Lab Results  Component Value Date   HGBA1C 7.5 (H) 05/23/2021   HGBA1C 7.3 (H) 02/20/2021   HGBA1C 7.5 (H) 11/21/2020   Lab Results  Component Value Date   MICROALBUR 4.2 (H) 02/23/2021   LDLCALC 77 05/23/2021   CREATININE 1.04 05/23/2021    Lab Results  Component Value Date   FRUCTOSAMINE 245 09/19/2020   FRUCTOSAMINE 272 03/03/2019   FRUCTOSAMINE 243 03/07/2017  Other active problems: See review of systems    Lab on 05/23/2021  Component Date Value Ref Range Status   Cholesterol 05/23/2021 155  0 - 200 mg/dL Final   ATP III Classification       Desirable:  < 200 mg/dL               Borderline High:  200 - 239 mg/dL          High:  > = 240 mg/dL   Triglycerides 05/23/2021 98.0  0.0 - 149.0 mg/dL Final   Normal:  <150 mg/dLBorderline High:  150 - 199 mg/dL   HDL 05/23/2021 58.40  >39.00 mg/dL Final   VLDL 05/23/2021 19.6  0.0 - 40.0 mg/dL Final   LDL Cholesterol 05/23/2021 77  0 - 99 mg/dL Final   Total CHOL/HDL Ratio 05/23/2021 3   Final                  Men          Women1/2 Average Risk     3.4          3.3Average Risk          5.0          4.42X Average Risk          9.6          7.13X Average  Risk          15.0          11.0                       NonHDL 05/23/2021 96.11   Final   NOTE:  Non-HDL goal should be 30 mg/dL higher than patient's LDL goal (i.e. LDL goal of < 70 mg/dL, would have non-HDL goal of < 100 mg/dL)   Sodium 05/23/2021 137  135 - 145 mEq/L Final   Potassium 05/23/2021 3.9  3.5 - 5.1 mEq/L Final   Chloride 05/23/2021 100  96 - 112 mEq/L Final   CO2 05/23/2021 27  19 - 32 mEq/L Final   Glucose, Bld 05/23/2021 64 (A) 70 - 99 mg/dL Final   BUN 05/23/2021 19  6 - 23 mg/dL Final   Creatinine, Ser 05/23/2021 1.04  0.40 - 1.50 mg/dL Final   Total Bilirubin 05/23/2021 0.3  0.2 - 1.2 mg/dL Final   Alkaline Phosphatase 05/23/2021 57  39 - 117 U/L Final   AST 05/23/2021 41 (A) 0 - 37 U/L Final   ALT 05/23/2021 40  0 - 53 U/L Final   Total Protein 05/23/2021 7.9  6.0 - 8.3 g/dL Final   Albumin 05/23/2021 4.5  3.5 - 5.2 g/dL Final   GFR 05/23/2021 79.08  >60.00 mL/min Final   Calculated using the CKD-EPI Creatinine Equation (2021)   Calcium 05/23/2021 9.8  8.4 - 10.5 mg/dL Final   Hgb A1c MFr Bld 05/23/2021 7.5 (A) 4.6 - 6.5 % Final   Glycemic Control Guidelines for People with Diabetes:Non Diabetic:  <6%Goal of Therapy: <7%Additional Action Suggested:  >8%      Allergies as of 05/29/2021   No Known Allergies      Medication List        Accurate as of May 29, 2021  3:16 PM. If you have any questions, ask your nurse or doctor.          Accu-Chek FastClix Lancets Misc Use Accu Chek Fastclix lancets to check blood sugar three times daily. EB:R83.09  Accu-Chek Guide test strip Generic drug: glucose blood TEST BLOOD SUGAR THREE TIMES DAILY AS DIRECTED   acetaminophen 325 MG tablet Commonly known as: TYLENOL Take 650 mg by mouth as needed (pain).   cephALEXin 500 MG capsule Commonly known as: Keflex Take 1 capsule (500 mg total) by mouth 3 (three) times daily.   diclofenac Sodium 1 % Gel Commonly known as: Voltaren Apply 2 g topically 4 (four)  times daily.   empagliflozin 25 MG Tabs tablet Commonly known as: Jardiance Take 1 tablet (25 mg total) by mouth daily.   fluticasone 50 MCG/ACT nasal spray Commonly known as: FLONASE Use 2 spray(s) in each nostril once daily   gabapentin 300 MG capsule Commonly known as: NEURONTIN TAKE 1 CAPSULE FOUR TIMES DAILY AS NEEDED   insulin aspart 100 UNIT/ML injection Commonly known as: novoLOG Max of 56 units daily in V-Go pump   Insulin Syringes (Disposable) U-100 0.5 ML Misc Use to inject insulin   metFORMIN 750 MG 24 hr tablet Commonly known as: GLUCOPHAGE-XR TAKE 2 TABLETS (1,500 MG TOTAL) BY MOUTH DAILY.   pioglitazone 30 MG tablet Commonly known as: Actos Take 1 tablet (30 mg total) by mouth daily.   pravastatin 80 MG tablet Commonly known as: PRAVACHOL TAKE 1 TABLET EVERY DAY   Tresiba FlexTouch 100 UNIT/ML FlexTouch Pen Generic drug: insulin degludec Inject 12 Units into the skin daily.   V-Go 20 Kit APPLY V-GO 20 PUMP TO ARM ONE TIME DAILY AS DIRECTED        Allergies: No Known Allergies  Past Medical History:  Diagnosis Date   Arthritis    bilateral hips   Cutaneous abscess of left foot    Deep vein thrombosis (DVT) (HCC)    Diabetes mellitus    type II   Diabetic ulcer of heel (HCC)    Right heel   DJD (degenerative joint disease)    DVT (deep venous thrombosis) ([place]) 02/08/2014   Proximal provoked. Date of diagnosis February 08 2014 Duration of anticoagulation: 6 months. End date 08/12/2014.  Anticoagulant: Lovenox 120 units daily Switched to Eliquis on 05/25/2014      Ear drum perforation, right 03/06/2019   Non-pressure chronic ulcer of right calf, limited to breakdown of skin ([place]) 04/17/2017   Pulmonary emboli ([place]) 02/08/2014   Date of diagnosis February 08 2014, on chest CTA Hospitalized for 3 days Had some symptoms of shortness of breath, and chest pain With intercurrent DVT of the left LE. Duration of anticoagulation: 8 months. End date 10/11/2014.   Anticoagulant: Lovenox 120 units daily Switched to Eliquis on 05/25/2014 per patient preference    Pulmonary embolism ([place])    Sebaceous cyst    on back of neck    Past Surgical History:  Procedure Laterality Date   AMPUTATION Right 06/03/2015   Procedure: Right Below Knee Amputation;  Surgeon: [name], MD;  Location: [place];  Service: Orthopedics;  Laterality: Right;   AMPUTATION Left 07/24/2019   Procedure: LEFT FOOT 3RD RAY AMPUTATION;  Surgeon: [name], MD;  Location: [place];  Service: Orthopedics;  Laterality: Left;   CLOSED REDUCTION WITH HUMER PIN INSERTION  1974   left hip   HARDWARE REMOVAL Left 07/21/2014   Procedure: HARDWARE REMOVAL;  Surgeon: Ninetta Lights, MD;  Location: [place];  Service: Orthopedics;  Laterality: Left;   ROTATOR CUFF REPAIR Right 2005 (approx)   TOTAL HIP ARTHROPLASTY Left 07/21/2014   Procedure: TOTAL HIP ARTHROPLASTY ANTERIOR APPROACH;  Surgeon: Quillian Quince  Dennie Bible, MD;  Location: [place];  Service: Orthopedics;  Laterality: Left;   TOTAL HIP ARTHROPLASTY Right 2006 (approx)   right hip replaced    Family History  Problem Relation Age of Onset   Breast cancer Mother    Cancer Mother        small intestine   Liver cancer Mother    Diabetes Mother    Diabetes Father    Diabetes Brother    Hypertension Maternal Grandmother    Heart Problems Maternal Grandmother    Diabetes Paternal Grandmother    Diabetes Paternal Grandfather    Diabetes Brother     Social History:  reports that he has never smoked. He has never used smokeless tobacco. He reports current alcohol use. He reports that he does not use drugs.  Review of Systems:   Lipids: He is on Pravachol 80 mg  LDL is consistently below 100, normal triglycerides   Lab Results  Component Value Date   CHOL 155 05/23/2021   CHOL 144 11/21/2020   CHOL 151 06/21/2020   Lab Results  Component Value Date   HDL 58.40 05/23/2021   HDL 58.90 11/21/2020   HDL 43.10 06/21/2020   Lab  Results  Component Value Date   LDLCALC 77 05/23/2021   LDLCALC 65 11/21/2020   LDLCALC 78 06/21/2020   Lab Results  Component Value Date   TRIG 98.0 05/23/2021   TRIG 102.0 11/21/2020   TRIG 147.0 06/21/2020   Lab Results  Component Value Date   CHOLHDL 3 05/23/2021   CHOLHDL 2 11/21/2020   CHOLHDL 3 06/21/2020   Lab Results  Component Value Date   LDLDIRECT 91.0 07/11/2015    Has history of abnormal liver functions chronically Previously had not improved with stopping pravastatin  With starting Actos in 06/2020 his liver functions have improved Is again back to normal with 30 mg dose of Actos however AST is minimally higher   He has a fatty liver which was seen on his ultrasound  Lab Results  Component Value Date   ALT 40 05/23/2021   ALT 37 02/20/2021   ALT 49 11/21/2020   ALT 40 09/19/2020   ALT 61 (H) 06/21/2020     HYPERTENSION: Still not requiring pharmacological treatment Controlled with Jardiance  Recently he thinks he has had some ankle swelling   BP Readings from Last 3 Encounters:  05/29/21 118/72  02/23/21 116/76  11/24/20 124/80   Renal function normal Microalbumin normal as of 4/21  Lab Results  Component Value Date   CREATININE 1.04 05/23/2021   CREATININE 0.91 02/20/2021   CREATININE 0.95 11/21/2020   NEUROPATHY:  He has burning in the left foot along with discomfort, paresthesia, some numbness  Also has paresthesias in his hand He takes 2 capsules a gabapentin at night he will have some hangover in the morning and excessive dreams  Recently treated for toe infection on the left, resolved     Examination:   BP 118/72   Pulse 63   Ht 6' (1.829 m)   Wt 211 lb (95.7 kg)   SpO2 94%   BMI 28.62 kg/m   Body mass index is 28.62 kg/m.   ASSESSMENT/ PLAN:     Diabetes type 2 insulin-dependent:  See history of present illness for detailed discussion of current diabetes management, blood sugar patterns and problems  identified  A1c is about the same at 7.5  He is on [place] and NovoLog along with Jardiance 25 mg, 30 mg  Actos and Metformin  Blood sugars are fairly well controlled although not clear why his A1c is higher than expected for his home blood sugar average of 139 His morning sugars are on an average higher than target but mostly related to some sweets during the night  Prescribed fairly good postprandial readings when checked Since he can adjust his mealtime dose based on his Premeal and postprandial readings he will benefit from CGM and discussed with him him detailed information on using the freestyle libre and he will DME suppliers for this  Abnormal liver functions: Liver functions are improved with Actos 30 mg likely fatty liver  However because of his ankle edema we will reduce his Actos to every other day  Lipids well controlled on 80 mg pravastatin and will continue  For his neuropathy he can take 2 capsules of gabapentin at suppertime and not take any extra at bedtime for better tolerability Also he may benefit from neurology consultation for symptoms in his hands, he will discuss with PCP  Follow-up in 3 months  Flu vaccine given  There are no Patient Instructions on file for this visit.    Elayne Snare 05/29/2021, 3:16 PM   Note: This office note was prepared with Dragon voice recognition system technology. Any transcriptional errors that result from this process are unintentional.

## 2021-05-29 NOTE — Patient Instructions (Addendum)
Take Actos every 2 days  Check blood sugars on waking up 4 days a week  Also check blood sugars about 2 hours after meals and do this after different meals by rotation  Recommended blood sugar levels on waking up are 90-130 and about 2 hours after meal is 130-160  Please bring your blood sugar monitor to each visit, thank you  Call re: Josephine Igo

## 2021-06-13 ENCOUNTER — Telehealth: Payer: Self-pay | Admitting: Orthopedic Surgery

## 2021-06-13 ENCOUNTER — Encounter: Payer: Self-pay | Admitting: Family

## 2021-06-13 ENCOUNTER — Ambulatory Visit (INDEPENDENT_AMBULATORY_CARE_PROVIDER_SITE_OTHER): Payer: Medicare HMO | Admitting: Family

## 2021-06-13 DIAGNOSIS — M869 Osteomyelitis, unspecified: Secondary | ICD-10-CM

## 2021-06-13 MED ORDER — DOXYCYCLINE HYCLATE 100 MG PO TABS: 100.0000 mg | ORAL_TABLET | Freq: Two times a day (BID) | ORAL | 0 refills | Status: DC

## 2021-06-13 NOTE — Progress Notes (Signed)
Office Visit Note   Patient: Gregory May           Date of Birth: 1960/10/13           MRN: [id number] Visit Date: 06/13/2021              Requested by: Gregory Filbert, MD [address] [place],  [place] [address] PCP: Gregory Filbert, MD  Chief Complaint  Patient presents with   Left Foot - Follow-up    Ulcer on 2nd toe and heel      HPI: The patient is a 60 year old gentleman who presents today for evaluation of ulcer on the second toe of his left foot.  We have been monitoring this he is concerned it may have gotten worse he did resume his extra-depth shoe wear on the left he has been wearing the laces loosely so as not to get pressure on the dorsum of his toes.  He feels things have improved he has been using Band-Aids daily does have some drainage on the Band-Aid  Denies fevers.  Assessment & Plan: Visit Diagnoses: No diagnosis found.  Plan: Discussed bone exposure and left second toe.  The patient is in agreement with the plan to proceed with second toe amputation on the left.  He does have some commitments over the weekend and would like to delay surgery until next week.  We will place him on oral antibiotics.  Discussed strict return precautions.  Follow-Up Instructions: Return postoperatively.   Ortho Exam  Patient is alert, oriented, no adenopathy, well-dressed, normal affect, normal respiratory effort. On examination of the left foot the foot itself is without edema or erythema.  The second toe with erythema and edema.  There is an ulcer over the PIP joint of the left second toe this is 6 mm in diameter there is fibrinous exudative tissue in the wound bed there is also does probe to bone.  He has a palpable dorsalis pedis pulse.  There is no purulence.  Imaging: No results found. No images are attached to the encounter.  Labs: Lab Results  Component Value Date   HGBA1C 7.5 (H) 05/23/2021   HGBA1C 7.3 (H) 02/20/2021   HGBA1C 7.5 (H) 11/21/2020    ESRSEDRATE 8 07/15/2019   ESRSEDRATE 56 (H) 06/01/2015   CRP 48 (H) 07/15/2019   REPTSTATUS 07/29/2019 FINAL 07/24/2019   GRAMSTAIN  07/24/2019    RARE WBC PRESENT, PREDOMINANTLY MONONUCLEAR NO ORGANISMS SEEN    CULT  07/24/2019    RARE STAPHYLOCOCCUS CAPRAE NO ANAEROBES ISOLATED Performed at [facility], [address], [place], [place] [address]    [employer] STAPHYLOCOCCUS CAPRAE 07/24/2019     Lab Results  Component Value Date   ALBUMIN 4.5 05/23/2021   ALBUMIN 4.1 02/20/2021   ALBUMIN 4.2 11/21/2020    No results found for: MG Lab Results  Component Value Date   VD25OH 16 (L) 11/05/2013    No results found for: PREALBUMIN CBC EXTENDED Latest Ref Rng & Units 06/02/2020 07/15/2019 06/07/2015  WBC 4.0 - 10.5 K/uL 8.5 11.8(H) 13.2(H)  RBC 4.22 - 5.81 MIL/uL 5.48 5.23 4.57  HGB 13.0 - 17.0 g/dL 32.3 55.7 32.2  HCT 02.5 - 52.0 % 47.2 47.4 41.7  PLT 150 - 400 K/uL 357 383 435(H)  NEUTROABS 1.7 - 7.7 K/uL 5.6 8.2(H) 8.9(H)  LYMPHSABS 0.7 - 4.0 K/uL 1.9 2.2 2.5     There is no height or weight on file to calculate BMI.  Orders:  No orders of the defined types were placed in this encounter.  No orders of the defined types were placed in this encounter.    Procedures: No procedures performed  Clinical Data: No additional findings.  ROS:  All other systems negative, except as noted in the HPI. Review of Systems  Constitutional:  Negative for chills and fever.  Skin:  Positive for color change and wound.   Objective: Vital Signs: There were no vitals taken for this visit.  Specialty Comments:  No specialty comments available.  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 10/07/2018   Essential hypertension 06/19/2018   Peripheral neuropathy 12/19/2017   Acquired absence of right leg below knee ([place]) 04/17/2017   Carpal tunnel syndrome 03/18/2017   Colon cancer screening 06/21/2015   Status post below knee amputation of right lower extremity ([place]) 06/07/2015   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 ([place]) 06/01/2015   Diabetic polyneuropathy associated with type 2 diabetes mellitus ([place]) 12/09/2014   Diabetes mellitus with carpal tunnel syndrome ([place]) 12/09/2014   DJD (degenerative joint disease) of knee 07/21/2014   Osteoarthritis, hip, bilateral 05/25/2014   Onychomycosis 10/14/2013   Pure hypercholesterolemia 09/25/2013   Diabetic foot ulcer ([place]) 09/15/2013   Poorly controlled type 2 diabetes mellitus with complication ([place]) [id number]   Past Medical History:  Diagnosis Date   Arthritis    bilateral hips   Cutaneous abscess of left foot    Deep vein thrombosis (DVT) (HCC)    Diabetes mellitus    type II   Diabetic ulcer of heel (HCC)    Right heel   DJD (degenerative joint disease)    DVT (deep venous thrombosis) ([place]) 02/08/2014   Proximal provoked. Date of diagnosis February 08 2014 Duration of anticoagulation: 6 months. End date 08/12/2014.  Anticoagulant: Lovenox 120 units daily Switched to Eliquis on 05/25/2014      Ear drum perforation, right 03/06/2019   Non-pressure chronic ulcer of right calf, limited to breakdown of skin ([place]) 04/17/2017   Pulmonary emboli ([place]) 02/08/2014   Date of diagnosis February 08 2014, on chest CTA Hospitalized for 3 days Had some symptoms of shortness of breath, and chest pain With intercurrent DVT of the left LE. Duration of anticoagulation: 8 months. End date 10/11/2014.  Anticoagulant: Lovenox 120 units daily Switched to Eliquis on 05/25/2014 per patient preference    Pulmonary embolism ([place])    Sebaceous cyst    on  back of neck    Family History  Problem Relation Age of Onset   Breast cancer Mother    Cancer Mother        small intestine   Liver cancer Mother    Diabetes Mother    Diabetes Father    Diabetes Brother    Hypertension Maternal Grandmother    Heart Problems Maternal Grandmother    Diabetes Paternal Grandmother    Diabetes Paternal Grandfather    Diabetes Brother     Past Surgical History:  Procedure Laterality Date   AMPUTATION Right 06/03/2015   Procedure: Right Below Knee Amputation;  Surgeon: [name], MD;  Location: [place];  Service: Orthopedics;  Laterality: Right;   AMPUTATION Left 07/24/2019   Procedure: LEFT FOOT 3RD RAY AMPUTATION;  Surgeon: [name], MD;  Location: [place];  Service: Orthopedics;  Laterality: Left;   CLOSED REDUCTION WITH HUMER PIN INSERTION  1974   left hip   HARDWARE REMOVAL Left 07/21/2014   Procedure: HARDWARE REMOVAL;  Surgeon: Ninetta Lights, MD;  Location: [place];  Service: Orthopedics;  Laterality: Left;   ROTATOR CUFF REPAIR Right 2005 (approx)   TOTAL HIP ARTHROPLASTY Left 07/21/2014   Procedure: TOTAL HIP ARTHROPLASTY ANTERIOR APPROACH;  Surgeon: Ninetta Lights, MD;  Location: [place];  Service: Orthopedics;  Laterality: Left;   TOTAL HIP ARTHROPLASTY Right 2006 (approx)   right hip replaced   Social History   Occupational History   Occupation: disabled  Tobacco Use   Smoking status: Never   Smokeless tobacco: Never  Vaping Use   Vaping Use: Never used  Substance and Sexual Activity   Alcohol use: Yes    Alcohol/week: 0.0 standard drinks    Comment: beer and mixed drink maybe 5  times a month   Drug use: No   Sexual activity: Never

## 2021-06-13 NOTE — Telephone Encounter (Signed)
Pt state Gregory May told him today he needs his toe amputated but he wants to see Lajoyce Corners about it. He said he trust Denny Peon he would rather Lajoyce Corners take another look at it and he can't wait until the 06/22/2021.

## 2021-06-14 NOTE — Telephone Encounter (Signed)
I called pt and advised that I could open a time at 3pm on Monday 06/19/21 if he is able to come in just call the office and let us know.

## 2021-06-14 NOTE — Telephone Encounter (Signed)
I called and left a message for this pt that I could open a 3 pm spot for him to see Dr. Lajoyce Corners on Monday 06/19/21. If he calls back to confirm can you let me knwo and I will open the spot. Thanks!

## 2021-06-19 ENCOUNTER — Ambulatory Visit (INDEPENDENT_AMBULATORY_CARE_PROVIDER_SITE_OTHER): Payer: Medicare HMO | Admitting: Orthopedic Surgery

## 2021-06-19 DIAGNOSIS — M869 Osteomyelitis, unspecified: Secondary | ICD-10-CM

## 2021-06-20 ENCOUNTER — Other Ambulatory Visit: Payer: Self-pay

## 2021-06-20 ENCOUNTER — Encounter: Payer: Self-pay | Admitting: Orthopedic Surgery

## 2021-06-20 ENCOUNTER — Other Ambulatory Visit: Payer: Self-pay | Admitting: Orthopedic Surgery

## 2021-06-20 DIAGNOSIS — M869 Osteomyelitis, unspecified: Secondary | ICD-10-CM

## 2021-06-20 NOTE — Progress Notes (Signed)
Office Visit Note   Patient: Gregory May           Date of Birth: 14-Jul-1961           MRN: [id number] Visit Date: 06/19/2021              Requested by: Timothy Lasso, MD [address] [place],  [place] [address] PCP: Timothy Lasso, MD  Chief Complaint  Patient presents with   Left Foot - Wound Check    Ulcer on 2nd toe and heel      HPI: Patient is a 60 year old gentleman who presents with progressive swelling ulceration and cellulitis left foot second toe.  Patient has been on oral antibiotics.  Assessment & Plan: Visit Diagnoses:  1. Osteomyelitis of second toe of left foot ([place])     Plan: Recommended proceeding with the amputation of the second toe left foot.  Plan for outpatient surgery at [facility] on Friday.  Follow-Up Instructions: Return in about 1 week (around 06/26/2021).   Ortho Exam  Patient is alert, oriented, no adenopathy, well-dressed, normal affect, normal respiratory effort. Examination patient has a palpable dorsalis pedis pulse.  He has progressive ulceration over the second toe PIP joint with exposed bone of the joint.  There is cellulitis sausage digit swelling.  He is status post a third toe amputation.  Imaging: No results found. No images are attached to the encounter.  Labs: Lab Results  Component Value Date   HGBA1C 7.5 (H) 05/23/2021   HGBA1C 7.3 (H) 02/20/2021   HGBA1C 7.5 (H) 11/21/2020   ESRSEDRATE 8 07/15/2019   ESRSEDRATE 56 (H) 06/01/2015   CRP 48 (H) 07/15/2019   REPTSTATUS 07/29/2019 FINAL 07/24/2019   GRAMSTAIN  07/24/2019    RARE WBC PRESENT, PREDOMINANTLY MONONUCLEAR NO ORGANISMS SEEN    CULT  07/24/2019    RARE STAPHYLOCOCCUS CAPRAE NO ANAEROBES ISOLATED Performed at [facility], [place] [address], [place], [place] [address]    LABORGA STAPHYLOCOCCUS CAPRAE 07/24/2019     Lab Results  Component Value Date   ALBUMIN 4.5 05/23/2021   ALBUMIN 4.1 02/20/2021   ALBUMIN 4.2 11/21/2020     No results found for: MG Lab Results  Component Value Date   VD25OH 16 (L) 11/05/2013    No results found for: PREALBUMIN CBC EXTENDED Latest Ref Rng & Units 06/02/2020 07/15/2019 06/07/2015  WBC 4.0 - 10.5 K/uL 8.5 11.8(H) 13.2(H)  RBC 4.22 - 5.81 MIL/uL 5.48 5.23 4.57  HGB 13.0 - 17.0 g/dL 15.0 16.2 13.9  HCT 39.0 - 52.0 % 47.2 47.4 41.7  PLT 150 - 400 K/uL 357 383 435(H)  NEUTROABS 1.7 - 7.7 K/uL 5.6 8.2(H) 8.9(H)  LYMPHSABS 0.7 - 4.0 K/uL 1.9 2.2 2.5     There is no height or weight on file to calculate BMI.  Orders:  No orders of the defined types were placed in this encounter.  No orders of the defined types were placed in this encounter.    Procedures: No procedures performed  Clinical Data: No additional findings.  ROS:  All other systems negative, except as noted in the HPI. Review of Systems  Objective: Vital Signs: There were no vitals taken for this visit.  Specialty Comments:  No specialty comments available.  PMFS History: Patient Active Problem List   Diagnosis Date Noted   Hepatic steatosis 06/24/2020   Back pain 04/20/2020   Flu vaccine need 04/20/2020   COVID-19 virus vaccination declined 04/20/2020   Left knee  pain 04/20/2020   Allergic sinusitis 04/20/2020   Elevated liver enzymes 04/20/2020   Diastasis of rectus abdominis 07/16/2019   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 10/07/2018   Essential hypertension 06/19/2018   Peripheral neuropathy 12/19/2017   Acquired absence of right leg below knee (HCC) 04/17/2017   Carpal tunnel syndrome 03/18/2017   Colon cancer screening 06/21/2015   Status post below knee amputation of right lower extremity (HCC) 06/07/2015   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) 06/01/2015   Diabetic polyneuropathy associated with type 2 diabetes mellitus (HCC) 12/09/2014   Diabetes mellitus with carpal tunnel syndrome (HCC) 12/09/2014   DJD (degenerative  joint disease) of knee 07/21/2014   Osteoarthritis, hip, bilateral 05/25/2014   Onychomycosis 10/14/2013   Pure hypercholesterolemia 09/25/2013   Diabetic foot ulcer (HCC) 09/15/2013   Poorly controlled type 2 diabetes mellitus with complication (HCC) 09/15/1997   Past Medical History:  Diagnosis Date   Arthritis    bilateral hips   Cutaneous abscess of left foot    Deep vein thrombosis (DVT) (HCC)    Diabetes mellitus    type II   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 Date of diagnosis February 08 2014 Duration of anticoagulation: 6 months. End date 08/12/2014.  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 Duration of anticoagulation: 8 months. End date 10/11/2014.  Anticoagulant: Lovenox 120 units daily Switched to Eliquis on 05/25/2014 per patient preference    Pulmonary embolism (HCC)    Sebaceous cyst    on back of neck    Family History  Problem Relation Age of Onset   Breast cancer Mother    Cancer Mother        small intestine   Liver cancer Mother    Diabetes Mother    Diabetes Father    Diabetes Brother    Hypertension Maternal Grandmother    Heart Problems Maternal Grandmother    Diabetes Paternal Grandmother    Diabetes Paternal Grandfather    Diabetes Brother     Past Surgical History:  Procedure Laterality Date   AMPUTATION Right 06/03/2015   Procedure: Right Below Knee Amputation;  Surgeon: Nadara Mustard, MD;  Location: [facility] OR;  Service: Orthopedics;  Laterality: Right;   AMPUTATION Left 07/24/2019    Procedure: LEFT FOOT 3RD RAY AMPUTATION;  Surgeon: Nadara Mustard, MD;  Location: [facility] OR;  Service: Orthopedics;  Laterality: Left;   CLOSED REDUCTION WITH HUMER PIN INSERTION  1974   left hip   HARDWARE REMOVAL Left 07/21/2014   Procedure: HARDWARE REMOVAL;  Surgeon: Loreta Ave, MD;  Location: [facility] OR;  Service: Orthopedics;  Laterality: Left;   ROTATOR CUFF REPAIR Right 2005 (approx)   TOTAL HIP ARTHROPLASTY Left 07/21/2014   Procedure: TOTAL HIP ARTHROPLASTY ANTERIOR APPROACH;  Surgeon: Loreta Ave, MD;  Location: [facility] OR;  Service: Orthopedics;  Laterality: Left;   TOTAL HIP ARTHROPLASTY Right 2006 (approx)   right hip replaced  Social History   Occupational History   Occupation: disabled  Tobacco Use   Smoking status: Never   Smokeless tobacco: Never  Vaping Use   Vaping Use: Never used  Substance and Sexual Activity   Alcohol use: Yes    Alcohol/week: 0.0 standard drinks    Comment: beer and mixed drink maybe 5  times a month   Drug use: No   Sexual activity: Never

## 2021-06-22 ENCOUNTER — Other Ambulatory Visit: Payer: Self-pay

## 2021-06-22 ENCOUNTER — Encounter (HOSPITAL_COMMUNITY): Payer: Self-pay | Admitting: Orthopedic Surgery

## 2021-06-22 NOTE — Progress Notes (Signed)
PCP - New PCP - Dr. Ruben Im  Cardiologist - denies EKG - DOS Chest x-ray -  ECHO -  Cardiac Cath -  CPAP -   Fasting Blood Sugar:  120-160 Checks Blood Sugar:  2-3x/day  Blood Thinner Instructions:  Aspirin Instructions:   ERAS Protcol -   COVID TEST- n/a  Anesthesia review: yes  -------------  SDW INSTRUCTIONS:  Your procedure is scheduled on 11/11 Friday. Please report to [facility] Main Entrance "A" at 1045 A.M., and check in at the Admitting office. Call this number if you have problems the morning of surgery: [phone]   Remember: Do not eat or drink after midnight the night before your surgery   Medications to take morning of surgery with a sip of water include: acetaminophen (TYLENOL) doxycycline (VIBRA-TABS) gabapentin (NEURONTIN) pravastatin (PRAVACHOL)  ** PLEASE check your blood sugar the morning of your surgery when you wake up and every 2 hours until you get to the Short Stay unit.  If your blood sugar is less than 70 mg/dL, you will need to treat for low blood sugar: Do not take insulin. Treat a low blood sugar (less than 70 mg/dL) with  cup of clear juice (cranberry or apple), 4 glucose tablets, OR glucose gel. Recheck blood sugar in 15 minutes after treatment (to make sure it is greater than 70 mg/dL). If your blood sugar is not greater than 70 mg/dL on recheck, call [phone] for further instructions. 11/10: AM [name] take as usual, PM Tresibatake 1/2 usual dose, Take metformin and Actos as usual, Take AM Novolog as usual, PM novolog NONE, NO jardiance  11/11: No diabetic medications DOS - if CBG >220 take 1/2 usual dose Novolog   As of today, STOP taking any Aspirin (unless otherwise instructed by your surgeon), Aleve, Naproxen, Ibuprofen, Motrin, Advil, Goody's, BC's, all herbal medications, fish oil, and all vitamins.    The Morning of Surgery Do not wear jewelry Do not wear lotions, powders, colognes, or deodorant  Do not bring  valuables to the hospital. [facility] is not responsible for any belongings or valuables.  If you are a smoker, DO NOT Smoke 24 hours prior to surgery  If you wear a CPAP at night please bring your mask the morning of surgery   Remember that you must have someone to transport you home after your surgery, and remain with you for 24 hours if you are discharged the same day.  Please bring cases for contacts, glasses, hearing aids, dentures or bridgework because it cannot be worn into surgery.   Patients discharged the day of surgery will not be allowed to drive home.   Please shower the NIGHT BEFORE/MORNING OF SURGERY (use antibacterial soap like DIAL soap if possible). Wear comfortable clothes the morning of surgery. Oral Hygiene is also important to reduce your risk of infection.  Remember - BRUSH YOUR TEETH THE MORNING OF SURGERY WITH YOUR REGULAR TOOTHPASTE  Patient denies shortness of breath, fever, cough and chest pain.

## 2021-06-22 NOTE — Anesthesia Preprocedure Evaluation (Addendum)
Anesthesia Evaluation  Patient identified by MRN, date of birth, ID band Patient awake    Reviewed: Allergy & Precautions, NPO status , Patient's Chart, lab work & pertinent test results  Airway Mallampati: III  TM Distance: >3 FB Neck ROM: Full    Dental no notable dental hx. (+) Teeth Intact, Poor Dentition,    Pulmonary neg pulmonary ROS,    Pulmonary exam normal breath sounds clear to auscultation       Cardiovascular hypertension, Pt. on medications Normal cardiovascular exam Rhythm:Regular Rate:Normal     Neuro/Psych  Neuromuscular disease negative psych ROS   GI/Hepatic negative GI ROS, Neg liver ROS,   Endo/Other  diabetes, Type 2  Renal/GU Lab Results      Component                Value               Date                      CREATININE               1.04                05/23/2021                BUN                      19                  05/23/2021                NA                       137                 05/23/2021                K                        3.9                 05/23/2021                CL                       100                 05/23/2021                CO2                      27                  05/23/2021                Musculoskeletal  (+) Arthritis ,   Abdominal   Peds  Hematology Lab Results      Component                Value               Date                      WBC  8.5                 06/02/2020                HGB                      15.0                06/02/2020                HCT                      47.2                06/02/2020                MCV                      86.1                06/02/2020                PLT                      357                 06/02/2020              Anesthesia Other Findings   Reproductive/Obstetrics                            Anesthesia Physical Anesthesia Plan  ASA: 3  Anesthesia  Plan: Regional   Post-op Pain Management:    Induction:   PONV Risk Score and Plan: 2 and Treatment may vary due to age or medical condition, Ondansetron and Midazolam  Airway Management Planned: Nasal Cannula and Natural Airway  Additional Equipment: None  Intra-op Plan:   Post-operative Plan:   Informed Consent: I have reviewed the patients History and Physical, chart, labs and discussed the procedure including the risks, benefits and alternatives for the proposed anesthesia with the patient or authorized representative who has indicated his/her understanding and acceptance.     Dental advisory given  Plan Discussed with: CRNA and Anesthesiologist  Anesthesia Plan Comments: (L Pop + MAC)       Anesthesia Quick Evaluation

## 2021-06-23 ENCOUNTER — Ambulatory Visit (HOSPITAL_COMMUNITY)
Admission: RE | Admit: 2021-06-23 | Discharge: 2021-06-23 | Disposition: A | Discharge status: Home / Self Care | Payer: Medicare HMO | Attending: Orthopedic Surgery | Admitting: Orthopedic Surgery

## 2021-06-23 ENCOUNTER — Ambulatory Visit (HOSPITAL_COMMUNITY): Payer: Medicare HMO | Admitting: Physician Assistant

## 2021-06-23 ENCOUNTER — Encounter (HOSPITAL_COMMUNITY)
Admission: RE | Disposition: A | Discharge status: Home / Self Care | Payer: Self-pay | Source: Home / Self Care | Attending: Orthopedic Surgery

## 2021-06-23 ENCOUNTER — Encounter (HOSPITAL_COMMUNITY): Payer: Self-pay | Admitting: Orthopedic Surgery

## 2021-06-23 ENCOUNTER — Other Ambulatory Visit: Payer: Self-pay

## 2021-06-23 DIAGNOSIS — L97529 Non-pressure chronic ulcer of other part of left foot with unspecified severity: Secondary | ICD-10-CM | POA: Insufficient documentation

## 2021-06-23 DIAGNOSIS — G709 Myoneural disorder, unspecified: Secondary | ICD-10-CM | POA: Diagnosis not present

## 2021-06-23 DIAGNOSIS — E1151 Type 2 diabetes mellitus with diabetic peripheral angiopathy without gangrene: Secondary | ICD-10-CM | POA: Diagnosis not present

## 2021-06-23 DIAGNOSIS — L03116 Cellulitis of left lower limb: Secondary | ICD-10-CM | POA: Diagnosis not present

## 2021-06-23 DIAGNOSIS — I10 Essential (primary) hypertension: Secondary | ICD-10-CM | POA: Diagnosis not present

## 2021-06-23 DIAGNOSIS — E1169 Type 2 diabetes mellitus with other specified complication: Secondary | ICD-10-CM | POA: Diagnosis not present

## 2021-06-23 DIAGNOSIS — E11621 Type 2 diabetes mellitus with foot ulcer: Secondary | ICD-10-CM | POA: Insufficient documentation

## 2021-06-23 DIAGNOSIS — E1142 Type 2 diabetes mellitus with diabetic polyneuropathy: Secondary | ICD-10-CM | POA: Diagnosis not present

## 2021-06-23 DIAGNOSIS — M869 Osteomyelitis, unspecified: Secondary | ICD-10-CM

## 2021-06-23 DIAGNOSIS — M86672 Other chronic osteomyelitis, left ankle and foot: Secondary | ICD-10-CM | POA: Diagnosis not present

## 2021-06-23 DIAGNOSIS — M1990 Unspecified osteoarthritis, unspecified site: Secondary | ICD-10-CM | POA: Diagnosis not present

## 2021-06-23 HISTORY — PX: AMPUTATION: SHX166

## 2021-06-23 LAB — CBC
HCT: 47.1 % (ref 39.0–52.0)
Hemoglobin: 15.5 g/dL (ref 13.0–17.0)
MCH: 31.5 pg (ref 26.0–34.0)
MCHC: 32.9 g/dL (ref 30.0–36.0)
MCV: 95.7 fL (ref 80.0–100.0)
Platelets: 430 10*3/uL — ABNORMAL HIGH (ref 150–400)
RBC: 4.92 MIL/uL (ref 4.22–5.81)
RDW: 14.1 % (ref 11.5–15.5)
WBC: 7.9 10*3/uL (ref 4.0–10.5)
nRBC: 0 % (ref 0.0–0.2)

## 2021-06-23 LAB — BASIC METABOLIC PANEL
Anion gap: 10 (ref 5–15)
BUN: 18 mg/dL (ref 6–20)
CO2: 22 mmol/L (ref 22–32)
Calcium: 8.9 mg/dL (ref 8.9–10.3)
Chloride: 102 mmol/L (ref 98–111)
Creatinine, Ser: 0.96 mg/dL (ref 0.61–1.24)
GFR, Estimated: >60 mL/min (ref >60)
Glucose, Bld: 108 mg/dL — ABNORMAL HIGH (ref 70–99)
Potassium: 4.8 mmol/L (ref 3.5–5.1)
Sodium: 134 mmol/L — ABNORMAL LOW (ref 135–145)

## 2021-06-23 LAB — GLUCOSE, CAPILLARY
Glucose-Capillary: 113 mg/dL — ABNORMAL HIGH (ref 70–99)
Glucose-Capillary: 116 mg/dL — ABNORMAL HIGH (ref 70–99)

## 2021-06-23 LAB — SURGICAL PCR SCREEN
MRSA, PCR: NEGATIVE
Staphylococcus aureus: POSITIVE — AB

## 2021-06-23 SURGERY — AMPUTATION DIGIT
Anesthesia: Regional | Laterality: Left

## 2021-06-23 MED ORDER — ONDANSETRON HCL 4 MG/2ML IJ SOLN: 4.0000 mg | Freq: Once | INTRAMUSCULAR | Status: DC | PRN

## 2021-06-23 MED ORDER — CHLORHEXIDINE GLUCONATE 0.12 % MT SOLN
OROMUCOSAL | Status: AC
  Administered 2021-06-23: 15 mL via OROMUCOSAL
  Filled 2021-06-23: qty 15

## 2021-06-23 MED ORDER — AMISULPRIDE (ANTIEMETIC) 5 MG/2ML IV SOLN: 10.0000 mg | Freq: Once | INTRAVENOUS | Status: DC | PRN

## 2021-06-23 MED ORDER — MIDAZOLAM HCL 2 MG/2ML IJ SOLN: 1.0000 mg | Freq: Once | INTRAMUSCULAR | Status: AC

## 2021-06-23 MED ORDER — OXYCODONE-ACETAMINOPHEN 5-325 MG PO TABS: 1.0000 | ORAL_TABLET | ORAL | 0 refills | Status: DC | PRN

## 2021-06-23 MED ORDER — BUPIVACAINE HCL (PF) 0.5 % IJ SOLN
INTRAMUSCULAR | Status: DC | PRN
  Administered 2021-06-23: 30 mL via PERINEURAL

## 2021-06-23 MED ORDER — MIDAZOLAM HCL 2 MG/2ML IJ SOLN
INTRAMUSCULAR | Status: AC
  Administered 2021-06-23: 1 mg via INTRAVENOUS
  Filled 2021-06-23: qty 2

## 2021-06-23 MED ORDER — OXYCODONE HCL 5 MG PO TABS: 5.0000 mg | ORAL_TABLET | Freq: Once | ORAL | Status: DC | PRN

## 2021-06-23 MED ORDER — LIDOCAINE HCL (PF) 1 % IJ SOLN
INTRAMUSCULAR | Status: AC
  Filled 2021-06-23: qty 30

## 2021-06-23 MED ORDER — LIDOCAINE HCL 1 % IJ SOLN
INTRAMUSCULAR | Status: DC | PRN
  Administered 2021-06-23: 18 mL

## 2021-06-23 MED ORDER — LACTATED RINGERS IV SOLN: INTRAVENOUS | Status: DC

## 2021-06-23 MED ORDER — CEFAZOLIN SODIUM-DEXTROSE 2-4 GM/100ML-% IV SOLN
2.0000 g | INTRAVENOUS | Status: AC
  Administered 2021-06-23: 2 g via INTRAVENOUS

## 2021-06-23 MED ORDER — CHLORHEXIDINE GLUCONATE 0.12 % MT SOLN: 15.0000 mL | Freq: Once | OROMUCOSAL | Status: AC

## 2021-06-23 MED ORDER — FENTANYL CITRATE (PF) 100 MCG/2ML IJ SOLN
INTRAMUSCULAR | Status: AC
  Administered 2021-06-23: 50 ug via INTRAVENOUS
  Filled 2021-06-23: qty 2

## 2021-06-23 MED ORDER — ACETAMINOPHEN 10 MG/ML IV SOLN: 1000.0000 mg | Freq: Once | INTRAVENOUS | Status: DC | PRN

## 2021-06-23 MED ORDER — PROPOFOL 10 MG/ML IV BOLUS
INTRAVENOUS | Status: DC | PRN
  Administered 2021-06-23: 50 mg via INTRAVENOUS

## 2021-06-23 MED ORDER — OXYCODONE HCL 5 MG/5ML PO SOLN: 5.0000 mg | Freq: Once | ORAL | Status: DC | PRN

## 2021-06-23 MED ORDER — CHLORHEXIDINE GLUCONATE 4 % EX LIQD: 60.0000 mL | Freq: Once | CUTANEOUS | Status: DC

## 2021-06-23 MED ORDER — POVIDONE-IODINE 10 % EX SWAB
2.0000 "application " | Freq: Once | CUTANEOUS | Status: AC
  Administered 2021-06-23: 2 via TOPICAL

## 2021-06-23 MED ORDER — MUPIROCIN 2 % EX OINT: 1.0000 "application " | TOPICAL_OINTMENT | Freq: Two times a day (BID) | CUTANEOUS | Status: DC

## 2021-06-23 MED ORDER — PROPOFOL 500 MG/50ML IV EMUL
INTRAVENOUS | Status: DC | PRN
  Administered 2021-06-23: 50 ug/kg/min via INTRAVENOUS

## 2021-06-23 MED ORDER — FENTANYL CITRATE (PF) 100 MCG/2ML IJ SOLN: 50.0000 ug | Freq: Once | INTRAMUSCULAR | Status: AC

## 2021-06-23 MED ORDER — 0.9 % SODIUM CHLORIDE (POUR BTL) OPTIME
TOPICAL | Status: DC | PRN
  Administered 2021-06-23: 1000 mL

## 2021-06-23 MED ORDER — HYDROMORPHONE HCL 1 MG/ML IJ SOLN: 0.2500 mg | INTRAMUSCULAR | Status: DC | PRN

## 2021-06-23 MED ORDER — CEFAZOLIN SODIUM-DEXTROSE 2-4 GM/100ML-% IV SOLN
INTRAVENOUS | Status: AC
  Filled 2021-06-23: qty 100

## 2021-06-23 MED ORDER — ONDANSETRON HCL 4 MG/2ML IJ SOLN
INTRAMUSCULAR | Status: DC | PRN
  Administered 2021-06-23: 4 mg via INTRAVENOUS

## 2021-06-23 MED ORDER — ORAL CARE MOUTH RINSE: 15.0000 mL | Freq: Once | OROMUCOSAL | Status: AC

## 2021-06-23 SURGICAL SUPPLY — 30 items
BAG COUNTER SPONGE SURGICOUNT (BAG) ×2 IMPLANT
BLADE SURG 21 STRL SS (BLADE) ×2 IMPLANT
BNDG COHESIVE 4X5 TAN STRL (GAUZE/BANDAGES/DRESSINGS) ×2 IMPLANT
BNDG COHESIVE 6X5 TAN NS LF (GAUZE/BANDAGES/DRESSINGS) ×2 IMPLANT
BNDG ESMARK 4X9 LF (GAUZE/BANDAGES/DRESSINGS) IMPLANT
BNDG GAUZE ELAST 4 BULKY (GAUZE/BANDAGES/DRESSINGS) ×2 IMPLANT
COVER SURGICAL LIGHT HANDLE (MISCELLANEOUS) ×4 IMPLANT
DRAPE U-SHAPE 47X51 STRL (DRAPES) ×2 IMPLANT
DRSG ADAPTIC 3X8 NADH LF (GAUZE/BANDAGES/DRESSINGS) IMPLANT
DRSG PAD ABDOMINAL 8X10 ST (GAUZE/BANDAGES/DRESSINGS) ×2 IMPLANT
DURAPREP 26ML APPLICATOR (WOUND CARE) ×2 IMPLANT
ELECT REM PT RETURN 9FT ADLT (ELECTROSURGICAL) ×2
ELECTRODE REM PT RTRN 9FT ADLT (ELECTROSURGICAL) ×1 IMPLANT
GAUZE SPONGE 4X4 12PLY STRL (GAUZE/BANDAGES/DRESSINGS) IMPLANT
GAUZE SPONGE 4X4 12PLY STRL LF (GAUZE/BANDAGES/DRESSINGS) ×2 IMPLANT
GLOVE SURG ORTHO LTX SZ9 (GLOVE) ×2 IMPLANT
GLOVE SURG UNDER POLY LF SZ9 (GLOVE) ×2 IMPLANT
GOWN STRL REUS W/ TWL XL LVL3 (GOWN DISPOSABLE) ×2 IMPLANT
GOWN STRL REUS W/TWL XL LVL3 (GOWN DISPOSABLE) ×4
KIT BASIN OR (CUSTOM PROCEDURE TRAY) ×2 IMPLANT
KIT TURNOVER KIT B (KITS) ×2 IMPLANT
MANIFOLD NEPTUNE II (INSTRUMENTS) ×2 IMPLANT
NEEDLE 22X1 1/2 (OR ONLY) (NEEDLE) IMPLANT
NS IRRIG 1000ML POUR BTL (IV SOLUTION) ×2 IMPLANT
PACK ORTHO EXTREMITY (CUSTOM PROCEDURE TRAY) ×2 IMPLANT
PAD ABD 8X10 STRL (GAUZE/BANDAGES/DRESSINGS) ×2 IMPLANT
PAD ARMBOARD 7.5X6 YLW CONV (MISCELLANEOUS) ×4 IMPLANT
SUT ETHILON 2 0 PSLX (SUTURE) ×2 IMPLANT
SYR CONTROL 10ML LL (SYRINGE) IMPLANT
TOWEL GREEN STERILE (TOWEL DISPOSABLE) ×2 IMPLANT

## 2021-06-23 NOTE — Anesthesia Procedure Notes (Signed)
Procedure Name: MAC Date/Time: 06/23/2021 1:44 PM Performed by: Annamary Carolin, CRNA Pre-anesthesia Checklist: Patient identified, Emergency Drugs available, Suction available and Patient being monitored Dental Injury: Teeth and Oropharynx as per pre-operative assessment

## 2021-06-23 NOTE — Anesthesia Postprocedure Evaluation (Signed)
Anesthesia Post Note  Patient: Gregory May  Procedure(s) Performed: LEFT 2ND TOE AMPUTATION (Left)     Patient location during evaluation: PACU Anesthesia Type: Regional Level of consciousness: awake and alert Pain management: pain level controlled Vital Signs Assessment: post-procedure vital signs reviewed and stable Respiratory status: spontaneous breathing, nonlabored ventilation, respiratory function stable and patient connected to nasal cannula oxygen Cardiovascular status: stable and blood pressure returned to baseline Postop Assessment: no apparent nausea or vomiting Anesthetic complications: no   No notable events documented.  Last Vitals:  Vitals:   06/23/21 1400 06/23/21 1416  BP: 111/74 111/79  Pulse: (!) 47 (!) 57  Resp: 16 14  Temp: (!) 36.4 C (!) 36.4 C  SpO2: 98% 97%    Last Pain:  Vitals:   06/23/21 1416  TempSrc:   PainSc: 0-No pain                 Trevor Iha

## 2021-06-23 NOTE — Transfer of Care (Signed)
Immediate Anesthesia Transfer of Care Note  Patient: Gregory May  Procedure(s) Performed: LEFT 2ND TOE AMPUTATION (Left)  Patient Location: PACU  Anesthesia Type:MAC and Regional  Level of Consciousness: awake, alert  and patient cooperative  Airway & Oxygen Therapy: Patient Spontanous Breathing and Patient connected to face mask oxygen  Post-op Assessment: Report given to RN, Post -op Vital signs reviewed and stable and Patient moving all extremities  Post vital signs: Reviewed and stable  Last Vitals:  Vitals Value Taken Time  BP 111/74 06/23/21 1401  Temp    Pulse 57 06/23/21 1403  Resp 13 06/23/21 1403  SpO2 94 % 06/23/21 1403  Vitals shown include unvalidated device data.  Last Pain:  Vitals:   06/23/21 1235  TempSrc:   PainSc: 0-No pain         Complications: No notable events documented.

## 2021-06-23 NOTE — H&P (Signed)
Gregory May is an 60 y.o. male.   Chief Complaint: Ulceration cellulitis swelling left second toe. HPI: Patient is a 60 year old gentleman with type 2 diabetes peripheral vascular disease with ulceration and abscess and exposed bone of the second toe PIP joint.  Patient has undergone conservative therapy including antibiotics without resolution.  Past Medical History:  Diagnosis Date   Arthritis    bilateral hips   Cutaneous abscess of left foot    Deep vein thrombosis (DVT) (HCC)    Diabetes mellitus    type II   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 Date of diagnosis February 08 2014 Duration of anticoagulation: 6 months. End date 08/12/2014.  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 Duration of anticoagulation: 8 months. End date 10/11/2014.  Anticoagulant: Lovenox 120 units daily Switched to Eliquis on 05/25/2014 per patient preference    Pulmonary embolism (HCC)    Sebaceous cyst    on back of neck    Past Surgical History:  Procedure Laterality Date   AMPUTATION Right 06/03/2015   Procedure: Right Below Knee Amputation;  Surgeon: Nadara Mustard, MD;  Location: [facility] OR;  Service: Orthopedics;  Laterality: Right;   AMPUTATION Left 07/24/2019   Procedure: LEFT FOOT 3RD RAY AMPUTATION;  Surgeon: Nadara Mustard, MD;  Location: [facility] OR;  Service: Orthopedics;  Laterality: Left;   CLOSED REDUCTION WITH HUMER PIN INSERTION  1974   left hip   HARDWARE REMOVAL Left 07/21/2014   Procedure: HARDWARE REMOVAL;  Surgeon: Loreta Ave, MD;  Location: [facility] OR;  Service: Orthopedics;  Laterality: Left;   ROTATOR CUFF REPAIR Right 2005 (approx)   TOTAL HIP ARTHROPLASTY Left 07/21/2014   Procedure: TOTAL HIP ARTHROPLASTY ANTERIOR APPROACH;  Surgeon: Loreta Ave, MD;  Location: [facility] OR;  Service: Orthopedics;  Laterality: Left;   TOTAL HIP ARTHROPLASTY Right 2006 (approx)   right hip replaced    Family History  Problem Relation Age of Onset   Breast cancer Mother    Cancer Mother        small intestine   Liver cancer Mother    Diabetes Mother    Diabetes Father    Diabetes Brother    Hypertension Maternal Grandmother    Heart Problems Maternal Grandmother    Diabetes Paternal Grandmother    Diabetes Paternal Grandfather    Diabetes Brother    Social History:  reports that he has never smoked. He has never used smokeless tobacco. He reports that he does not currently use alcohol. He reports that he does not use drugs.  Allergies: No Known Allergies  No medications prior to admission.    No results found for this or any previous visit (from the past 48 hour(s)). No results found.  Review of Systems  All other systems reviewed and are negative.  Height 6' (1.829 m), weight 93 kg. Physical Exam  Patient is alert, oriented, no adenopathy, well-dressed, normal affect, normal respiratory effort. Examination patient has a palpable  dorsalis pedis pulse.  He has progressive ulceration over the second toe PIP joint with exposed bone of the joint.  There is cellulitis sausage digit swelling.  He is status post a third toe amputation. Assessment/Plan Assessment: Osteomyelitis ulceration and cellulitis left foot second toe.  Plan: We will plan for left second toe amputation.  Risk and benefits were discussed including risk of the incision not healing.  Patient states he understands wished to proceed at this time plan for discharge after surgery.  Nadara Mustard, MD 06/23/2021, 9:22 AM

## 2021-06-23 NOTE — Anesthesia Procedure Notes (Signed)
Anesthesia Regional Block: Popliteal block   Pre-Anesthetic Checklist: , timeout performed,  Correct Patient, Correct Site, Correct Laterality,  Correct Procedure, Correct Position, site marked,  Risks and benefits discussed,  Pre-op evaluation,  At surgeon's request and post-op pain management  Laterality: Left and Upper  Prep: Maximum Sterile Barrier Precautions used, chloraprep       Needles:  Injection technique: Single-shot  Needle Type: Echogenic Needle     Needle Length: 9cm  Needle Gauge: 21     Additional Needles:   Procedures:,,,, ultrasound used (permanent image in chart),,    Narrative:  Start time: 06/23/2021 12:18 PM End time: 06/23/2021 12:25 PM Injection made incrementally with aspirations every 5 mL.  Performed by: Personally  Anesthesiologist: Trevor Iha, MD  Additional Notes: Block assessed. Patient tolerated procedure well.

## 2021-06-23 NOTE — Op Note (Signed)
06/23/2021  2:07 PM  PATIENT:  [name]    PRE-OPERATIVE DIAGNOSIS:  Osteomyelitis Left 2nd Toe  POST-OPERATIVE DIAGNOSIS:  Same  PROCEDURE:  LEFT 2ND TOE AMPUTATION  SURGEON:  [name], MD  PHYSICIAN ASSISTANT:None ANESTHESIA:   General  PREOPERATIVE INDICATIONS:  Gregory May is a  60 y.o. male with a diagnosis of Osteomyelitis Left 2nd Toe who failed conservative measures and elected for surgical management.    The risks benefits and alternatives were discussed with the patient preoperatively including but not limited to the risks of infection, bleeding, nerve injury, cardiopulmonary complications, the need for revision surgery, among others, and the patient was willing to proceed.  OPERATIVE IMPLANTS: None  _0 @  OPERATIVE FINDINGS: Margins clear  OPERATIVE PROCEDURE: Patient was brought the operating room and underwent a MAC anesthetic.  The left lower extremity was then prepped using DuraPrep draped into a sterile field a timeout was called.  Patient underwent local anesthesia with 18 cc of 1% lidocaine plain.  After adequate levels anesthesia were obtained a V incision was made around the second toe and the second toe was amputated through the MTP joint.  Electrocautery was used hemostasis the wound was irrigated with normal saline.  Incision was closed using 2-0 nylon sterile dressing was applied patient was taken the PACU in stable condition.   DISCHARGE PLANNING:  Antibiotic duration: Preoperative antibiotics  Weightbearing: Touchdown weightbearing on the left  Pain medication: Prescription for Percocet  Dressing care/ Wound VAC: Follow-up in the office 1 week to change the dressing  Ambulatory devices: Crutches or walker  Discharge to: Home.  Follow-up: In the office 1 week post operative.

## 2021-06-24 ENCOUNTER — Encounter (HOSPITAL_COMMUNITY): Payer: Self-pay | Admitting: Orthopedic Surgery

## 2021-06-30 ENCOUNTER — Ambulatory Visit (INDEPENDENT_AMBULATORY_CARE_PROVIDER_SITE_OTHER): Payer: Medicare HMO | Admitting: Family

## 2021-06-30 ENCOUNTER — Other Ambulatory Visit: Payer: Self-pay | Admitting: Orthopedic Surgery

## 2021-06-30 ENCOUNTER — Telehealth: Payer: Self-pay | Admitting: Family

## 2021-06-30 ENCOUNTER — Other Ambulatory Visit: Payer: Self-pay

## 2021-06-30 ENCOUNTER — Encounter: Payer: Self-pay | Admitting: Family

## 2021-06-30 DIAGNOSIS — Z89422 Acquired absence of other left toe(s): Secondary | ICD-10-CM

## 2021-06-30 MED ORDER — OXYCODONE-ACETAMINOPHEN 5-325 MG PO TABS: 1.0000 | ORAL_TABLET | ORAL | 0 refills | Status: DC | PRN

## 2021-06-30 NOTE — Progress Notes (Signed)
Post-Op Visit Note   Patient: Gregory May           Date of Birth: 26-Dec-1960           MRN: [id number] Visit Date: 06/30/2021 PCP: [name], MD  Chief Complaint: No chief complaint on file.   HPI:  HPI The patient is a 60 year old gentleman seen status post second toe amputation on the left.  He is 1 week out. Ortho Exam On examination of the left foot incision is well approximated sutures there is no gaping no drainage no erythema no maceration  Visit Diagnoses: No diagnosis found.  Plan: Begin daily Dial soap cleansing.  Dry dressing changes.  Continue minimizing weightbearing follow-up in the office in 2 weeks for suture removal.  Follow-Up Instructions: No follow-ups on file.   Imaging: No results found.  Orders:  No orders of the defined types were placed in this encounter.  No orders of the defined types were placed in this encounter.    PMFS History: Patient Active Problem List   Diagnosis Date Noted   Osteomyelitis of second toe of left foot (HCC)    Hepatic steatosis 06/24/2020   Back pain 04/20/2020   Flu vaccine need 04/20/2020   COVID-19 virus vaccination declined 04/20/2020   Left knee pain 04/20/2020   Allergic sinusitis 04/20/2020   Elevated liver enzymes 04/20/2020   Diastasis of rectus abdominis 07/16/2019   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 10/07/2018   Essential hypertension 06/19/2018   Peripheral neuropathy 12/19/2017   Acquired absence of right leg below knee (HCC) 04/17/2017   Carpal tunnel syndrome 03/18/2017   Colon cancer screening 06/21/2015   Status post below knee amputation of right lower extremity (HCC) 06/07/2015   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) 06/01/2015   Diabetic polyneuropathy associated with type 2 diabetes mellitus (HCC) 12/09/2014   Diabetes mellitus with carpal tunnel syndrome (HCC) 12/09/2014   DJD (degenerative joint disease) of knee  07/21/2014   Osteoarthritis, hip, bilateral 05/25/2014   Onychomycosis 10/14/2013   Pure hypercholesterolemia 09/25/2013   Diabetic foot ulcer (HCC) 09/15/2013   Poorly controlled type 2 diabetes mellitus with complication (HCC) 09/15/1997   Past Medical History:  Diagnosis Date   Arthritis    bilateral hips   Cutaneous abscess of left foot    Deep vein thrombosis (DVT) (HCC)    Diabetes mellitus    type II   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 Date of diagnosis February 08 2014 Duration of anticoagulation: 6 months. End date 08/12/2014.  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 Duration of anticoagulation: 8 months. End date 10/11/2014.  Anticoagulant: Lovenox 120 units daily Switched to Eliquis on 05/25/2014 per patient preference    Pulmonary embolism (HCC)    Sebaceous cyst    on back of neck    Family History  Problem Relation Age of Onset   Breast cancer Mother    Cancer Mother  small intestine   Liver cancer Mother    Diabetes Mother    Diabetes Father    Diabetes Brother    Hypertension Maternal Grandmother    Heart Problems Maternal Grandmother    Diabetes Paternal Grandmother    Diabetes Paternal Grandfather    Diabetes Brother     Past Surgical History:  Procedure Laterality Date   AMPUTATION Right 06/03/2015   Procedure: Right Below Knee Amputation;  Surgeon: Nadara Mustard, MD;  Location[facility] OR;  Service: Orthopedics;  Laterality: Right;   AMPUTATION Left 07/24/2019   Procedure: LEFT FOOT  3RD RAY AMPUTATION;  Surgeon: Nadara Mustard, MD;  Location: [facility] OR;  Service: Orthopedics;  Laterality: Left;   AMPUTATION Left 06/23/2021   Procedure: LEFT 2ND TOE AMPUTATION;  Surgeon: Nadara Mustard, MD;  Location: [facility] OR;  Service: Orthopedics;  Laterality: Left;   CLOSED REDUCTION WITH HUMER PIN INSERTION  1974   left hip   HARDWARE REMOVAL Left 07/21/2014   Procedure: HARDWARE REMOVAL;  Surgeon: Loreta Ave, MD;  Location: [facility] OR;  Service: Orthopedics;  Laterality: Left;   ROTATOR CUFF REPAIR Right 2005 (approx)   TOTAL HIP ARTHROPLASTY Left 07/21/2014   Procedure: TOTAL HIP ARTHROPLASTY ANTERIOR APPROACH;  Surgeon: Loreta Ave, MD;  Location: [facility] OR;  Service: Orthopedics;  Laterality: Left;   TOTAL HIP ARTHROPLASTY Right 2006 (approx)   right hip replaced   Social History   Occupational History   Occupation: disabled  Tobacco Use   Smoking status: Never   Smokeless tobacco: Never  Vaping Use   Vaping Use: Never used  Substance and Sexual Activity   Alcohol use: Not Currently    Comment: beer and mixed drink maybe 5  times a month   Drug use: No   Sexual activity: Never

## 2021-06-30 NOTE — Telephone Encounter (Signed)
Patient called. He would like oxycodone called in for pain. Also he would like to know if he should continue the antibiotic? His call back number is [phone]

## 2021-06-30 NOTE — Telephone Encounter (Signed)
Pt is a left 2nd toe amp 06/23/21 and asking for refill on oxycodone please advise. Will also tell him that he does not need to continue with abx please advise.

## 2021-06-30 NOTE — Telephone Encounter (Signed)
I called pt to advise o message below.

## 2021-07-04 ENCOUNTER — Telehealth: Payer: Self-pay | Admitting: Endocrinology

## 2021-07-04 ENCOUNTER — Other Ambulatory Visit: Payer: Self-pay | Admitting: Endocrinology

## 2021-07-04 ENCOUNTER — Telehealth: Payer: Self-pay | Admitting: Family

## 2021-07-04 DIAGNOSIS — E1165 Type 2 diabetes mellitus with hyperglycemia: Secondary | ICD-10-CM

## 2021-07-04 MED ORDER — METFORMIN HCL ER 750 MG PO TB24: 1500.0000 mg | ORAL_TABLET | Freq: Every day | ORAL | 0 refills | Status: DC

## 2021-07-04 NOTE — Telephone Encounter (Signed)
Refill of Metformin now sent into pharmacy

## 2021-07-04 NOTE — Telephone Encounter (Signed)
MEDICATION: metFORMIN (GLUCOPHAGE-XR) 750 MG 24 hr tablet  PHARMACY:   [facility] [address] [place], [place] - [address] Phone:  [phone]  Fax:  [phone]      HAS THE PATIENT CONTACTED THEIR PHARMACY?  Yes  IS THIS A 90 DAY SUPPLY : ?  IS PATIENT OUT OF MEDICATION: Yes  IF NOT; HOW MUCH IS LEFT: 0  LAST APPOINTMENT DATE: @10 /17/2022  NEXT APPOINTMENT DATE:@1 /23/2023  DO WE HAVE YOUR PERMISSION TO LEAVE A DETAILED MESSAGE?: Yes  OTHER COMMENTS: Patient states [facility] is out of refills-[facility] will send request-the above RX request is for local PHARM listed above due to Patient is out of Metformin  Patient requests to be called at ph# [phone] once the above RX has been sent to [facility]  **Let patient know to contact pharmacy at the end of the day to make sure medication is ready. **  ** Please notify patient to allow 48-72 hours to process**  **Encourage patient to contact the pharmacy for refills or they can request refills through [facility]**

## 2021-07-04 NOTE — Telephone Encounter (Signed)
Patient called needing Rx refilled Oxycodone. The number to contact patient is [phone]

## 2021-07-04 NOTE — Telephone Encounter (Signed)
Pt has been using oxycodone every 4 hours on schedule. Today he says that he feels better. He might of over did it yesterday with standing a lot. Advised him to elevate leg and can take advil in between his oxycodone if needed. Needs to try and space out of pain meds more. He will call and update [place] tomorrow. He says he may be out by tomorrow and worried about Thanksgiving holiday coming up.

## 2021-07-04 NOTE — Telephone Encounter (Signed)
Pt is s/p a left foot 2nd toe amp asking for refill on Oxycodone just received on 06/30/2021 # 30

## 2021-07-05 ENCOUNTER — Other Ambulatory Visit: Payer: Self-pay | Admitting: Endocrinology

## 2021-07-05 DIAGNOSIS — E1165 Type 2 diabetes mellitus with hyperglycemia: Secondary | ICD-10-CM

## 2021-07-05 DIAGNOSIS — Z794 Long term (current) use of insulin: Secondary | ICD-10-CM

## 2021-07-05 NOTE — Telephone Encounter (Signed)
Pt calling for a refill on his oxycodone stating he is really needing it. The best pharmacy is [facility] [address], [place] - [address] and the best phone number is [phone].

## 2021-07-10 ENCOUNTER — Telehealth: Payer: Self-pay | Admitting: Family

## 2021-07-10 NOTE — Telephone Encounter (Signed)
Previous note sent to Dr. Lajoyce Corners, waiting for reply back.

## 2021-07-10 NOTE — Telephone Encounter (Signed)
Pt called asking for his oxycodone. Pt states he is in pain and he knows it is alittle to soon for pain medication but he had to take more due to pain. Please send oxycodone to pharmacy on file. Please call pt at [phone].

## 2021-07-10 NOTE — Telephone Encounter (Signed)
Pt does not have anymore oxycodone left. He has been taking the meds as directed on his bottle. It was last filled 06/30/21, 11 days out.

## 2021-07-11 MED ORDER — OXYCODONE-ACETAMINOPHEN 5-325 MG PO TABS: 1.0000 | ORAL_TABLET | Freq: Four times a day (QID) | ORAL | 0 refills | Status: DC | PRN

## 2021-07-11 NOTE — Telephone Encounter (Signed)
S/p left 2nd toe amputation on 06/23/21, oxycodone last filled 06/30/21 #30.  Gregory May can you please send in for pt, he has called several times to office due to a lot of pain.

## 2021-07-12 ENCOUNTER — Telehealth: Payer: Self-pay | Admitting: Endocrinology

## 2021-07-12 DIAGNOSIS — E1165 Type 2 diabetes mellitus with hyperglycemia: Secondary | ICD-10-CM

## 2021-07-12 NOTE — Telephone Encounter (Signed)
MEDICATION: metFORMIN (GLUCOPHAGE-XR) 750 MG 24 hr tablet  PHARMACY:   [facility] - [place], [place] - [address] Phone:  [phone]  Fax:  [phone]      HAS THE PATIENT CONTACTED THEIR PHARMACY?  Yes-PHARM told Patient RX was denied  IS THIS A 90 DAY SUPPLY : Yes  IS PATIENT OUT OF MEDICATION: No  IF NOT; HOW MUCH IS LEFT: slightly less than 1 month supply  LAST APPOINTMENT DATE: @11 /23/2022  NEXT APPOINTMENT DATE:@1 /23/2023  DO WE HAVE YOUR PERMISSION TO LEAVE A DETAILED MESSAGE?: Yes  OTHER COMMENTS: Also, Patient is on Assistance Program for Novolog and Trulicity, however, Patient needs new forms  for the aforementioned medications and requests to be called at ph# [phone] to be advised (Patient will soon be out of Trulicity)   **Let patient know to contact pharmacy at the end of the day to make sure medication is ready. **  ** Please notify patient to allow 48-72 hours to process**  **Encourage patient to contact the pharmacy for refills or they can request refills through [facility]**

## 2021-07-12 NOTE — Telephone Encounter (Signed)
Per Dr. Lajoyce Corners, too early for refill. Cannot send in.

## 2021-07-13 ENCOUNTER — Encounter: Payer: Self-pay | Admitting: Orthopedic Surgery

## 2021-07-13 ENCOUNTER — Other Ambulatory Visit: Payer: Self-pay

## 2021-07-13 ENCOUNTER — Ambulatory Visit (INDEPENDENT_AMBULATORY_CARE_PROVIDER_SITE_OTHER): Payer: Medicare HMO | Admitting: Orthopedic Surgery

## 2021-07-13 DIAGNOSIS — Z89422 Acquired absence of other left toe(s): Secondary | ICD-10-CM

## 2021-07-13 MED ORDER — METFORMIN HCL ER 750 MG PO TB24: 1500.0000 mg | ORAL_TABLET | Freq: Every day | ORAL | 0 refills | Status: DC

## 2021-07-13 NOTE — Telephone Encounter (Signed)
Rx sent to pharmacy   

## 2021-07-13 NOTE — Progress Notes (Signed)
Office Visit Note   Patient: Gregory May           Date of Birth: 01/31/1961           MRN: [id number] Visit Date: 07/13/2021              Requested by: Timothy Lasso, MD [address] [place],  [place] [address] PCP: Timothy Lasso, MD  Chief Complaint  Patient presents with   Left Foot - Routine Post Op    2nd tow amputation      HPI: Patient is a 60 year old gentleman who is seen in follow-up status post second toe amputation left foot.  Patient states has been having some increased swelling he recently has been sleeping sitting up.  Assessment & Plan: Visit Diagnoses:  1. H/O amputation of lesser toe, left (HCC)     Plan: Recommended knee-high compression stockings and elevation.  Sutures harvested today  Follow-Up Instructions: Return in about 4 weeks (around 08/10/2021).   Ortho Exam  Patient is alert, oriented, no adenopathy, well-dressed, normal affect, normal respiratory effort. Examination patient has brawny skin color changes in the left leg from venous insufficiency there is pitting edema up to the tibial tubercle there is swelling in the foot as well the surgical incision is well approximated there is no redness cellulitis or drainage.  We will harvest the sutures today.  Imaging: No results found. No images are attached to the encounter.  Labs: Lab Results  Component Value Date   HGBA1C 7.5 (H) 05/23/2021   HGBA1C 7.3 (H) 02/20/2021   HGBA1C 7.5 (H) 11/21/2020   ESRSEDRATE 8 07/15/2019   ESRSEDRATE 56 (H) 06/01/2015   CRP 48 (H) 07/15/2019   REPTSTATUS 07/29/2019 FINAL 07/24/2019   GRAMSTAIN  07/24/2019    RARE WBC PRESENT, PREDOMINANTLY MONONUCLEAR NO ORGANISMS SEEN    CULT  07/24/2019    RARE STAPHYLOCOCCUS CAPRAE NO ANAEROBES ISOLATED Performed at [facility], [place] [address], [place], [place] [address]    LABORGA STAPHYLOCOCCUS CAPRAE 07/24/2019     Lab Results  Component Value Date   ALBUMIN 4.5 05/23/2021    ALBUMIN 4.1 02/20/2021   ALBUMIN 4.2 11/21/2020    No results found for: MG Lab Results  Component Value Date   VD25OH 16 (L) 11/05/2013    No results found for: PREALBUMIN CBC EXTENDED Latest Ref Rng & Units 06/23/2021 06/02/2020 07/15/2019  WBC 4.0 - 10.5 K/uL 7.9 8.5 11.8(H)  RBC 4.22 - 5.81 MIL/uL 4.92 5.48 5.23  HGB 13.0 - 17.0 g/dL 15.5 15.0 16.2  HCT 39.0 - 52.0 % 47.1 47.2 47.4  PLT 150 - 400 K/uL 430(H) 357 383  NEUTROABS 1.7 - 7.7 K/uL - 5.6 8.2(H)  LYMPHSABS 0.7 - 4.0 K/uL - 1.9 2.2     There is no height or weight on file to calculate BMI.  Orders:  No orders of the defined types were placed in this encounter.  No orders of the defined types were placed in this encounter.    Procedures: No procedures performed  Clinical Data: No additional findings.  ROS:  All other systems negative, except as noted in the HPI. Review of Systems  Objective: Vital Signs: There were no vitals taken for this visit.  Specialty Comments:  No specialty comments available.  PMFS History: Patient Active Problem List   Diagnosis Date Noted   Osteomyelitis of second toe of left foot ([place])    Hepatic steatosis 06/24/2020   Back pain 04/20/2020  Flu vaccine need 04/20/2020   COVID-19 virus vaccination declined 04/20/2020   Left knee pain 04/20/2020   Allergic sinusitis 04/20/2020   Elevated liver enzymes 04/20/2020   Diastasis of rectus abdominis 07/16/2019   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 10/07/2018   Essential hypertension 06/19/2018   Peripheral neuropathy 12/19/2017   Acquired absence of right leg below knee (HCC) 04/17/2017   Carpal tunnel syndrome 03/18/2017   Colon cancer screening 06/21/2015   Status post below knee amputation of right lower extremity (HCC) 06/07/2015   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) 06/01/2015   Diabetic polyneuropathy associated with type 2 diabetes mellitus (HCC) 12/09/2014    Diabetes mellitus with carpal tunnel syndrome (HCC) 12/09/2014   DJD (degenerative joint disease) of knee 07/21/2014   Osteoarthritis, hip, bilateral 05/25/2014   Onychomycosis 10/14/2013   Pure hypercholesterolemia 09/25/2013   Diabetic foot ulcer (HCC) 09/15/2013   Poorly controlled type 2 diabetes mellitus with complication (HCC) 09/15/1997   Past Medical History:  Diagnosis Date   Arthritis    bilateral hips   Cutaneous abscess of left foot    Deep vein thrombosis (DVT) (HCC)    Diabetes mellitus    type II   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 Date of diagnosis February 08 2014 Duration of anticoagulation: 6 months. End date 08/12/2014.  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 Duration of anticoagulation: 8 months. End date 10/11/2014.  Anticoagulant: Lovenox 120 units daily Switched to Eliquis on 05/25/2014 per patient preference    Pulmonary embolism (HCC)    Sebaceous cyst    on back of neck    Family History  Problem Relation Age of Onset   Breast cancer Mother    Cancer Mother        small intestine   Liver cancer Mother    Diabetes Mother    Diabetes Father    Diabetes Brother    Hypertension Maternal Grandmother    Heart Problems Maternal Grandmother    Diabetes Paternal Grandmother    Diabetes Paternal Grandfather    Diabetes Brother     Past Surgical History:  Procedure Laterality Date   AMPUTATION Right 06/03/2015   Procedure: Right Below Knee Amputation;  Surgeon: Nadara Mustard, MD;   Location: [facility] OR;  Service: Orthopedics;  Laterality: Right;   AMPUTATION Left 07/24/2019   Procedure: LEFT FOOT 3RD RAY AMPUTATION;  Surgeon: Nadara Mustard, MD;  Location: [facility] OR;  Service: Orthopedics;  Laterality: Left;   AMPUTATION Left 06/23/2021   Procedure: LEFT 2ND TOE AMPUTATION;  Surgeon: Nadara Mustard, MD;  Location: [facility] OR;  Service: Orthopedics;  Laterality: Left;   CLOSED REDUCTION WITH HUMER PIN INSERTION  1974   left hip   HARDWARE REMOVAL Left 07/21/2014   Procedure: HARDWARE REMOVAL;  Surgeon: Loreta Ave, MD;  Location: [facility] OR;  Service: Orthopedics;  Laterality: Left;   ROTATOR CUFF REPAIR Right 2005 (approx)   TOTAL  HIP ARTHROPLASTY Left 07/21/2014   Procedure: TOTAL HIP ARTHROPLASTY ANTERIOR APPROACH;  Surgeon: Ninetta Lights, MD;  Location: [facility];  Service: Orthopedics;  Laterality: Left;   TOTAL HIP ARTHROPLASTY Right 2006 (approx)   right hip replaced   Social History   Occupational History   Occupation: disabled  Tobacco Use   Smoking status: Never   Smokeless tobacco: Never  Vaping Use   Vaping Use: Never used  Substance and Sexual Activity   Alcohol use: Not Currently    Comment: beer and mixed drink maybe 5  times a month   Drug use: No   Sexual activity: Never

## 2021-07-18 NOTE — Telephone Encounter (Signed)
Patient requests to be called at ph# [phone] re: Patient's previous request. Patient states he only received a 30 day supply of Metformin, however Patient requested a 90 day supply.  Also, Patient is almost completely out of Trulicity (Please see previous message under Other Comments section).

## 2021-07-19 ENCOUNTER — Other Ambulatory Visit: Payer: Self-pay | Admitting: Endocrinology

## 2021-07-19 ENCOUNTER — Other Ambulatory Visit: Payer: Self-pay | Admitting: Student

## 2021-07-19 DIAGNOSIS — E1165 Type 2 diabetes mellitus with hyperglycemia: Secondary | ICD-10-CM

## 2021-07-19 DIAGNOSIS — J309 Allergic rhinitis, unspecified: Secondary | ICD-10-CM

## 2021-07-19 DIAGNOSIS — Z794 Long term (current) use of insulin: Secondary | ICD-10-CM

## 2021-07-19 NOTE — Telephone Encounter (Signed)
Remaining 60 days have been sent to pharmacy in addition to the 30 days previously sent for a total of 90 day supply.

## 2021-07-20 NOTE — Telephone Encounter (Signed)
Patient has not been seen in the Clinics since 04/20/2020.

## 2021-07-26 ENCOUNTER — Telehealth: Payer: Self-pay | Admitting: Endocrinology

## 2021-07-26 NOTE — Telephone Encounter (Signed)
Per [employer] patient has to fill out a new application. I reached out to patient to let him know he will come by to get application.

## 2021-07-26 NOTE — Telephone Encounter (Signed)
Patient will also pick up sample of tresiba

## 2021-07-26 NOTE — Telephone Encounter (Signed)
MEDICATION: insulin aspart (NOVOLOG) 100 UNIT/ML injection insulin degludec (TRESIBA FLEXTOUCH) 100 UNIT/ML FlexTouch Pen  PHARMACY:  [facility]  HAS THE PATIENT CONTACTED THEIR PHARMACY?  NO  IS THIS A 90 DAY SUPPLY : YES  IS PATIENT OUT OF MEDICATION: YES on Tresiba. Have 1 vial left on Novolog.  IF NOT; HOW MUCH IS LEFT:   LAST APPOINTMENT DATE: @12 /14/2022  NEXT APPOINTMENT DATE:@1 /23/2023  DO WE HAVE YOUR PERMISSION TO LEAVE A DETAILED MESSAGE?:  OTHER COMMENTS: PT is asking for sample cannot afford.Pt needs PAP form to be updated and forward to 01-23-1988.   **Let patient know to contact pharmacy at the end of the day to make sure medication is ready. **  ** Please notify patient to allow 48-72 hours to process**  **Encourage patient to contact the pharmacy for refills or they can request refills through [facility]**

## 2021-07-28 NOTE — Telephone Encounter (Signed)
Patient picked up [place] sample. Also completed the paperwork for [employer] for patient assistance. Gave completed paperwork to [employer]

## 2021-08-03 ENCOUNTER — Other Ambulatory Visit: Payer: Self-pay

## 2021-08-03 ENCOUNTER — Ambulatory Visit (INDEPENDENT_AMBULATORY_CARE_PROVIDER_SITE_OTHER): Payer: Medicare HMO | Admitting: Orthopedic Surgery

## 2021-08-03 ENCOUNTER — Encounter: Payer: Self-pay | Admitting: Orthopedic Surgery

## 2021-08-03 DIAGNOSIS — Z89511 Acquired absence of right leg below knee: Secondary | ICD-10-CM

## 2021-08-03 DIAGNOSIS — S88111A Complete traumatic amputation at level between knee and ankle, right lower leg, initial encounter: Secondary | ICD-10-CM

## 2021-08-03 DIAGNOSIS — Z89422 Acquired absence of other left toe(s): Secondary | ICD-10-CM

## 2021-08-03 DIAGNOSIS — L97521 Non-pressure chronic ulcer of other part of left foot limited to breakdown of skin: Secondary | ICD-10-CM

## 2021-08-03 NOTE — Progress Notes (Signed)
Office Visit Note   Patient: Gregory May           Date of Birth: October 21, 1960           MRN: [id number] Visit Date: 08/03/2021              Requested by: [name], MD [address] 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  He is status post a right transtibial amputation which she has no problems.  Assessment & Plan: Visit Diagnoses:  1. H/O amputation of lesser toe, left (HCC)   2. Ulcer of toe of left foot, limited to breakdown of skin (HCC)   3. Below-knee amputation of right lower extremity (HCC)     Plan: Recommended a Dr. Margart Sickles pad to unload pressure from the medial border of the MTP joint of the great toe.  Patient was given a Exum crew sock.  Follow-Up Instructions: Return in about 4 weeks (around 08/31/2021).   Ortho Exam  Patient is alert, oriented, no adenopathy, well-dressed, normal affect, normal respiratory effort. Examination there is no open ulcers over the bunion he does have progressive hallux valgus deformity secondary to the second toe amputation.  There is a small wound dorsally 5 mm in diameter with fibrinous tissue there is no drainage no cellulitis no signs of infection.  Imaging: No results found. No images are attached to the encounter.  Labs: Lab Results  Component Value Date   HGBA1C 7.5 (H) 05/23/2021   HGBA1C 7.3 (H) 02/20/2021   HGBA1C 7.5 (H) 11/21/2020   ESRSEDRATE 8 07/15/2019   ESRSEDRATE 56 (H) 06/01/2015   CRP 48 (H) 07/15/2019   REPTSTATUS 07/29/2019 FINAL 07/24/2019   GRAMSTAIN  07/24/2019    RARE WBC PRESENT, PREDOMINANTLY MONONUCLEAR NO ORGANISMS SEEN    CULT  07/24/2019    RARE STAPHYLOCOCCUS  CAPRAE NO ANAEROBES ISOLATED Performed at [facility], [address] [place], [place] [address]    [facility] STAPHYLOCOCCUS CAPRAE 07/24/2019     Lab Results  Component Value Date   ALBUMIN 4.5 05/23/2021   ALBUMIN 4.1 02/20/2021   ALBUMIN 4.2 11/21/2020    No results found for: MG Lab Results  Component Value Date   VD25OH 16 (L) 11/05/2013    No results found for: PREALBUMIN CBC EXTENDED Latest Ref Rng & Units 06/23/2021 06/02/2020 07/15/2019  WBC 4.0 - 10.5 K/uL 7.9 8.5 11.8(H)  RBC 4.22 - 5.81 MIL/uL 4.92 5.48 5.23  HGB 13.0 - 17.0 g/dL 25.0 53.9 76.7  HCT 34.1 - 52.0 % 47.1 47.2 47.4  PLT 150 - 400 K/uL 430(H) 357 383  NEUTROABS 1.7 - 7.7 K/uL - 5.6 8.2(H)  LYMPHSABS 0.7 - 4.0 K/uL - 1.9 2.2     There is no height or weight on file to calculate BMI.  Orders:  No orders of the defined types were placed in this encounter.  No orders of the defined types were placed in this encounter.    Procedures: No procedures performed  Clinical Data: No additional findings.  ROS:  All other systems negative, except as noted in the HPI. Review of Systems  Objective: Vital Signs: There  were no vitals taken for this visit.  Specialty Comments:  No specialty comments available.  PMFS History: Patient Active Problem List   Diagnosis Date Noted   Osteomyelitis of second toe of left foot (HCC)    Hepatic steatosis 06/24/2020   Back pain 04/20/2020   Flu vaccine need 04/20/2020   COVID-19 virus vaccination declined 04/20/2020   Left knee pain 04/20/2020   Allergic sinusitis 04/20/2020   Elevated liver enzymes 04/20/2020   Diastasis of rectus abdominis 07/16/2019   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 10/07/2018   Essential hypertension 06/19/2018   Peripheral neuropathy 12/19/2017   Acquired absence of right leg below knee (HCC) 04/17/2017   Carpal tunnel syndrome 03/18/2017   Colon cancer screening 06/21/2015    Status post below knee amputation of right lower extremity (HCC) 06/07/2015   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) 06/01/2015   Diabetic polyneuropathy associated with type 2 diabetes mellitus (HCC) 12/09/2014   Diabetes mellitus with carpal tunnel syndrome (HCC) 12/09/2014   DJD (degenerative joint disease) of knee 07/21/2014   Osteoarthritis, hip, bilateral 05/25/2014   Onychomycosis 10/14/2013   Pure hypercholesterolemia 09/25/2013   Diabetic foot ulcer (HCC) 09/15/2013   Poorly controlled type 2 diabetes mellitus with complication (HCC) 09/15/1997   Past Medical History:  Diagnosis Date   Arthritis    bilateral hips   Cutaneous abscess of left foot    Deep vein thrombosis (DVT) (HCC)    Diabetes mellitus    type II   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 Date of diagnosis February 08 2014 Duration of anticoagulation: 6 months. End date 08/12/2014.  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 Duration of anticoagulation: 8 months. End date 10/11/2014.  Anticoagulant: Lovenox 120 units daily Switched to Eliquis on 05/25/2014 per patient preference    Pulmonary embolism (HCC)    Sebaceous cyst    on back of neck    Family History  Problem Relation Age of Onset   Breast cancer Mother    Cancer Mother        small intestine   Liver cancer Mother    Diabetes Mother    Diabetes Father    Diabetes Brother    Hypertension Maternal Grandmother    Heart Problems Maternal Grandmother    Diabetes Paternal  Grandmother    Diabetes Paternal Grandfather    Diabetes Brother     Past Surgical History:  Procedure Laterality Date   AMPUTATION Right 06/03/2015   Procedure: Right Below Knee Amputation;  Surgeon: Nadara MustardMarcus Norberta Stobaugh V, MD;  Location: [facility] OR;  Service: Orthopedics;  Laterality: Right;   AMPUTATION Left 07/24/2019   Procedure: LEFT FOOT 3RD RAY AMPUTATION;  Surgeon: Nadara Mustarduda, Cayli Escajeda V, MD;  [facility];  Service: Orthopedics;  Laterality: Left;   AMPUTATION Left 06/23/2021   Procedure: LEFT 2ND TOE AMPUTATION;  Surgeon: Nadara Mustarduda, Lindora Alviar V, MD;  [facility];  Service: Orthopedics;  Laterality: Left;  CLOSED REDUCTION WITH HUMER PIN INSERTION  1974   left hip   HARDWARE REMOVAL Left 07/21/2014   Procedure: HARDWARE REMOVAL;  Surgeon: Ninetta Lights, MD;  Location: [place];  Service: Orthopedics;  Laterality: Left;   ROTATOR CUFF REPAIR Right 2005 (approx)   TOTAL HIP ARTHROPLASTY Left 07/21/2014   Procedure: TOTAL HIP ARTHROPLASTY ANTERIOR APPROACH;  Surgeon: Ninetta Lights, MD;  Location: [place];  Service: Orthopedics;  Laterality: Left;   TOTAL HIP ARTHROPLASTY Right 2006 (approx)   right hip replaced   Social History   Occupational History   Occupation: disabled  Tobacco Use   Smoking status: Never   Smokeless tobacco: Never  Vaping Use   Vaping Use: Never used  Substance and Sexual Activity   Alcohol use: Not Currently    Comment: beer and mixed drink maybe 5  times a month   Drug use: No   Sexual activity: Never

## 2021-08-10 ENCOUNTER — Encounter: Payer: Medicare HMO | Admitting: Orthopedic Surgery

## 2021-08-16 ENCOUNTER — Telehealth: Payer: Self-pay | Admitting: *Deleted
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
## 2021-08-16 NOTE — Telephone Encounter (Signed)
Notified patient to take doxycline 100 mg BID till he is seen in office. He is very Adult nurse.

## 2021-08-17 ENCOUNTER — Ambulatory Visit (HOSPITAL_COMMUNITY)
Admission: RE | Admit: 2021-08-17 | Discharge: 2021-08-17 | Disposition: A | Discharge status: Home / Self Care | Payer: Medicare HMO | Source: Ambulatory Visit | Attending: Internal Medicine | Admitting: Internal Medicine

## 2021-08-17 ENCOUNTER — Other Ambulatory Visit: Payer: Self-pay

## 2021-08-17 ENCOUNTER — Encounter: Payer: Self-pay | Admitting: Internal Medicine

## 2021-08-17 ENCOUNTER — Ambulatory Visit (INDEPENDENT_AMBULATORY_CARE_PROVIDER_SITE_OTHER): Payer: Medicare HMO | Admitting: Internal Medicine

## 2021-08-17 VITALS — BP 148/87 | HR 102 | Temp 98.2°F | Wt 211.3 lb

## 2021-08-17 DIAGNOSIS — E1165 Type 2 diabetes mellitus with hyperglycemia: Secondary | ICD-10-CM | POA: Diagnosis present

## 2021-08-17 DIAGNOSIS — Z86718 Personal history of other venous thrombosis and embolism: Secondary | ICD-10-CM | POA: Diagnosis not present

## 2021-08-17 DIAGNOSIS — I10 Essential (primary) hypertension: Secondary | ICD-10-CM | POA: Diagnosis present

## 2021-08-17 DIAGNOSIS — Z7982 Long term (current) use of aspirin: Secondary | ICD-10-CM | POA: Diagnosis not present

## 2021-08-17 DIAGNOSIS — Z79899 Other long term (current) drug therapy: Secondary | ICD-10-CM | POA: Diagnosis not present

## 2021-08-17 DIAGNOSIS — Z86711 Personal history of pulmonary embolism: Secondary | ICD-10-CM | POA: Diagnosis not present

## 2021-08-17 DIAGNOSIS — Z8249 Family history of ischemic heart disease and other diseases of the circulatory system: Secondary | ICD-10-CM | POA: Diagnosis not present

## 2021-08-17 DIAGNOSIS — Z89511 Acquired absence of right leg below knee: Secondary | ICD-10-CM | POA: Diagnosis not present

## 2021-08-17 DIAGNOSIS — L03011 Cellulitis of right finger: Secondary | ICD-10-CM

## 2021-08-17 DIAGNOSIS — L02511 Cutaneous abscess of right hand: Secondary | ICD-10-CM | POA: Diagnosis present

## 2021-08-17 DIAGNOSIS — M7989 Other specified soft tissue disorders: Secondary | ICD-10-CM | POA: Diagnosis not present

## 2021-08-17 DIAGNOSIS — M86141 Other acute osteomyelitis, right hand: Secondary | ICD-10-CM | POA: Diagnosis not present

## 2021-08-17 DIAGNOSIS — M1990 Unspecified osteoarthritis, unspecified site: Secondary | ICD-10-CM | POA: Diagnosis not present

## 2021-08-17 DIAGNOSIS — Z7984 Long term (current) use of oral hypoglycemic drugs: Secondary | ICD-10-CM | POA: Diagnosis not present

## 2021-08-17 DIAGNOSIS — M869 Osteomyelitis, unspecified: Secondary | ICD-10-CM | POA: Diagnosis not present

## 2021-08-17 DIAGNOSIS — Z800 Family history of malignant neoplasm of digestive organs: Secondary | ICD-10-CM | POA: Diagnosis not present

## 2021-08-17 DIAGNOSIS — M86641 Other chronic osteomyelitis, right hand: Secondary | ICD-10-CM | POA: Diagnosis present

## 2021-08-17 DIAGNOSIS — Z803 Family history of malignant neoplasm of breast: Secondary | ICD-10-CM | POA: Diagnosis not present

## 2021-08-17 DIAGNOSIS — E785 Hyperlipidemia, unspecified: Secondary | ICD-10-CM | POA: Diagnosis present

## 2021-08-17 DIAGNOSIS — E1169 Type 2 diabetes mellitus with other specified complication: Secondary | ICD-10-CM | POA: Diagnosis present

## 2021-08-17 DIAGNOSIS — Z20822 Contact with and (suspected) exposure to covid-19: Secondary | ICD-10-CM | POA: Diagnosis present

## 2021-08-17 DIAGNOSIS — Z833 Family history of diabetes mellitus: Secondary | ICD-10-CM | POA: Diagnosis not present

## 2021-08-17 DIAGNOSIS — Z794 Long term (current) use of insulin: Secondary | ICD-10-CM | POA: Diagnosis not present

## 2021-08-17 DIAGNOSIS — Z96643 Presence of artificial hip joint, bilateral: Secondary | ICD-10-CM | POA: Diagnosis present

## 2021-08-17 DIAGNOSIS — E1152 Type 2 diabetes mellitus with diabetic peripheral angiopathy with gangrene: Secondary | ICD-10-CM | POA: Diagnosis present

## 2021-08-17 LAB — POCT GLYCOSYLATED HEMOGLOBIN (HGB A1C): Hemoglobin A1C: 7 % — AB (ref 4.0–5.6)

## 2021-08-17 LAB — CBC WITH DIFFERENTIAL/PLATELET
Abs Immature Granulocytes: 0.04 10*3/uL (ref 0.00–0.07)
Basophils Absolute: 0 10*3/uL (ref 0.0–0.1)
Basophils Relative: 0 %
Eosinophils Absolute: 0 10*3/uL (ref 0.0–0.5)
Eosinophils Relative: 0 %
HCT: 49.4 % (ref 39.0–52.0)
Hemoglobin: 16 g/dL (ref 13.0–17.0)
Immature Granulocytes: 0 %
Lymphocytes Relative: 13 %
Lymphs Abs: 1.6 10*3/uL (ref 0.7–4.0)
MCH: 31.1 pg (ref 26.0–34.0)
MCHC: 32.4 g/dL (ref 30.0–36.0)
MCV: 95.9 fL (ref 80.0–100.0)
Monocytes Absolute: 1.1 10*3/uL — ABNORMAL HIGH (ref 0.1–1.0)
Monocytes Relative: 9 %
Neutro Abs: 9.7 10*3/uL — ABNORMAL HIGH (ref 1.7–7.7)
Neutrophils Relative %: 78 %
Platelets: 329 10*3/uL (ref 150–400)
RBC: 5.15 MIL/uL (ref 4.22–5.81)
RDW: 14.1 % (ref 11.5–15.5)
WBC: 12.4 10*3/uL — ABNORMAL HIGH (ref 4.0–10.5)
nRBC: 0 % (ref 0.0–0.2)

## 2021-08-17 LAB — BASIC METABOLIC PANEL
Anion gap: 9 (ref 5–15)
BUN: 18 mg/dL (ref 6–20)
CO2: 25 mmol/L (ref 22–32)
Calcium: 9 mg/dL (ref 8.9–10.3)
Chloride: 101 mmol/L (ref 98–111)
Creatinine, Ser: 0.87 mg/dL (ref 0.61–1.24)
GFR, Estimated: >60 mL/min (ref >60)
Glucose, Bld: 127 mg/dL — ABNORMAL HIGH (ref 70–99)
Potassium: 4.8 mmol/L (ref 3.5–5.1)
Sodium: 135 mmol/L (ref 135–145)

## 2021-08-17 LAB — GLUCOSE, CAPILLARY: Glucose-Capillary: 119 mg/dL — ABNORMAL HIGH (ref 70–99)

## 2021-08-17 MED ORDER — CEFTRIAXONE SODIUM 1 G IJ SOLR
1.0000 g | Freq: Once | INTRAMUSCULAR | Status: AC
  Administered 2021-08-17: 1 g via INTRAMUSCULAR
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
## 2021-08-17 NOTE — Patient Instructions (Addendum)
Dear Mr. Cedillos,  Today we evaluated you for redness and swelling of your finger. We are concerned for a serious infection in your finger. We have given you a shot of antibiotics here. We would like for you to be admitted to the hospital, unfortunately, they do not have any beds available currently. We will obtain some imaging and labwork in the clinic. Please wait for the hospital to call you with bed availability.

## 2021-08-18 ENCOUNTER — Telehealth: Payer: Self-pay | Admitting: Orthopedic Surgery

## 2021-08-18 NOTE — Telephone Encounter (Signed)
Pt has medical questions about a hand problem he spoke to Dr. Lajoyce Corners. Pt says he trust and really need to speak to Dr. Lajoyce Corners. Pt phone number is [phone].

## 2021-08-19 ENCOUNTER — Encounter: Payer: Self-pay | Admitting: Internal Medicine

## 2021-08-19 ENCOUNTER — Inpatient Hospital Stay (HOSPITAL_COMMUNITY)
Admission: AD | Admit: 2021-08-19 | Discharge: 2021-08-22 | DRG: 256 | Disposition: A | Discharge status: Home / Self Care | Payer: Medicare HMO | Source: Ambulatory Visit | Attending: Internal Medicine | Admitting: Internal Medicine

## 2021-08-19 ENCOUNTER — Other Ambulatory Visit: Payer: Self-pay

## 2021-08-19 ENCOUNTER — Encounter (HOSPITAL_COMMUNITY): Payer: Self-pay | Admitting: Internal Medicine

## 2021-08-19 DIAGNOSIS — E1152 Type 2 diabetes mellitus with diabetic peripheral angiopathy with gangrene: Principal | ICD-10-CM | POA: Diagnosis present

## 2021-08-19 DIAGNOSIS — M86641 Other chronic osteomyelitis, right hand: Secondary | ICD-10-CM | POA: Diagnosis present

## 2021-08-19 DIAGNOSIS — Z89511 Acquired absence of right leg below knee: Secondary | ICD-10-CM

## 2021-08-19 DIAGNOSIS — L02511 Cutaneous abscess of right hand: Secondary | ICD-10-CM | POA: Diagnosis present

## 2021-08-19 DIAGNOSIS — M869 Osteomyelitis, unspecified: Secondary | ICD-10-CM

## 2021-08-19 DIAGNOSIS — Z20822 Contact with and (suspected) exposure to covid-19: Secondary | ICD-10-CM | POA: Diagnosis present

## 2021-08-19 DIAGNOSIS — Z79899 Other long term (current) drug therapy: Secondary | ICD-10-CM

## 2021-08-19 DIAGNOSIS — Z800 Family history of malignant neoplasm of digestive organs: Secondary | ICD-10-CM | POA: Diagnosis not present

## 2021-08-19 DIAGNOSIS — Z86711 Personal history of pulmonary embolism: Secondary | ICD-10-CM

## 2021-08-19 DIAGNOSIS — Z7984 Long term (current) use of oral hypoglycemic drugs: Secondary | ICD-10-CM | POA: Diagnosis not present

## 2021-08-19 DIAGNOSIS — Z803 Family history of malignant neoplasm of breast: Secondary | ICD-10-CM | POA: Diagnosis not present

## 2021-08-19 DIAGNOSIS — E1169 Type 2 diabetes mellitus with other specified complication: Secondary | ICD-10-CM | POA: Diagnosis present

## 2021-08-19 DIAGNOSIS — E785 Hyperlipidemia, unspecified: Secondary | ICD-10-CM | POA: Diagnosis present

## 2021-08-19 DIAGNOSIS — Z833 Family history of diabetes mellitus: Secondary | ICD-10-CM | POA: Diagnosis not present

## 2021-08-19 DIAGNOSIS — Z86718 Personal history of other venous thrombosis and embolism: Secondary | ICD-10-CM

## 2021-08-19 DIAGNOSIS — I10 Essential (primary) hypertension: Secondary | ICD-10-CM | POA: Diagnosis present

## 2021-08-19 DIAGNOSIS — Z7982 Long term (current) use of aspirin: Secondary | ICD-10-CM

## 2021-08-19 DIAGNOSIS — E1165 Type 2 diabetes mellitus with hyperglycemia: Secondary | ICD-10-CM | POA: Diagnosis present

## 2021-08-19 DIAGNOSIS — M86141 Other acute osteomyelitis, right hand: Secondary | ICD-10-CM | POA: Diagnosis not present

## 2021-08-19 DIAGNOSIS — Z794 Long term (current) use of insulin: Secondary | ICD-10-CM | POA: Diagnosis not present

## 2021-08-19 DIAGNOSIS — Z8249 Family history of ischemic heart disease and other diseases of the circulatory system: Secondary | ICD-10-CM | POA: Diagnosis not present

## 2021-08-19 DIAGNOSIS — L03011 Cellulitis of right finger: Secondary | ICD-10-CM | POA: Diagnosis present

## 2021-08-19 DIAGNOSIS — Z96643 Presence of artificial hip joint, bilateral: Secondary | ICD-10-CM | POA: Diagnosis present

## 2021-08-19 LAB — COMPREHENSIVE METABOLIC PANEL
ALT: 45 U/L — ABNORMAL HIGH (ref 0–44)
AST: 47 U/L — ABNORMAL HIGH (ref 15–41)
Albumin: 3.2 g/dL — ABNORMAL LOW (ref 3.5–5.0)
Alkaline Phosphatase: 54 U/L (ref 38–126)
Anion gap: 9 (ref 5–15)
BUN: 14 mg/dL (ref 6–20)
CO2: 24 mmol/L (ref 22–32)
Calcium: 8.9 mg/dL (ref 8.9–10.3)
Chloride: 105 mmol/L (ref 98–111)
Creatinine, Ser: 0.88 mg/dL (ref 0.61–1.24)
GFR, Estimated: >60 mL/min (ref >60)
Glucose, Bld: 111 mg/dL — ABNORMAL HIGH (ref 70–99)
Potassium: 4.1 mmol/L (ref 3.5–5.1)
Sodium: 138 mmol/L (ref 135–145)
Total Bilirubin: 0.2 mg/dL — ABNORMAL LOW (ref 0.3–1.2)
Total Protein: 6.8 g/dL (ref 6.5–8.1)

## 2021-08-19 LAB — CBC WITH DIFFERENTIAL/PLATELET
Abs Immature Granulocytes: 0.02 10*3/uL (ref 0.00–0.07)
Basophils Absolute: 0.1 10*3/uL (ref 0.0–0.1)
Basophils Relative: 1 %
Eosinophils Absolute: 0.1 10*3/uL (ref 0.0–0.5)
Eosinophils Relative: 1 %
HCT: 44.3 % (ref 39.0–52.0)
Hemoglobin: 14.8 g/dL (ref 13.0–17.0)
Immature Granulocytes: 0 %
Lymphocytes Relative: 21 %
Lymphs Abs: 1.8 10*3/uL (ref 0.7–4.0)
MCH: 31.8 pg (ref 26.0–34.0)
MCHC: 33.4 g/dL (ref 30.0–36.0)
MCV: 95.1 fL (ref 80.0–100.0)
Monocytes Absolute: 0.8 10*3/uL (ref 0.1–1.0)
Monocytes Relative: 9 %
Neutro Abs: 5.7 10*3/uL (ref 1.7–7.7)
Neutrophils Relative %: 68 %
Platelets: 331 10*3/uL (ref 150–400)
RBC: 4.66 MIL/uL (ref 4.22–5.81)
RDW: 14 % (ref 11.5–15.5)
WBC: 8.5 10*3/uL (ref 4.0–10.5)
nRBC: 0 % (ref 0.0–0.2)

## 2021-08-19 LAB — RESP PANEL BY RT-PCR (FLU A&B, COVID) ARPGX2
Influenza A by PCR: NEGATIVE
Influenza B by PCR: NEGATIVE
SARS Coronavirus 2 by RT PCR: NEGATIVE

## 2021-08-19 LAB — C-REACTIVE PROTEIN: CRP: 1.7 mg/dL — ABNORMAL HIGH (ref <1.0)

## 2021-08-19 LAB — GLUCOSE, CAPILLARY
Glucose-Capillary: 99 mg/dL (ref 70–99)
Glucose-Capillary: 116 mg/dL — ABNORMAL HIGH (ref 70–99)

## 2021-08-19 LAB — PROTIME-INR
INR: 1 (ref 0.8–1.2)
Prothrombin Time: 13.4 seconds (ref 11.4–15.2)

## 2021-08-19 LAB — HIV ANTIBODY (ROUTINE TESTING W REFLEX): HIV Screen 4th Generation wRfx: NONREACTIVE

## 2021-08-19 LAB — SEDIMENTATION RATE: Sed Rate: 25 mm/hr — ABNORMAL HIGH (ref 0–16)

## 2021-08-19 MED ORDER — PRAVASTATIN SODIUM 40 MG PO TABS
80.0000 mg | ORAL_TABLET | Freq: Every day | ORAL | Status: DC
  Administered 2021-08-20 – 2021-08-22 (×3): 80 mg via ORAL
  Filled 2021-08-19 (×4): qty 2

## 2021-08-19 MED ORDER — INSULIN ASPART 100 UNIT/ML IJ SOLN: 0.0000 [IU] | INTRAMUSCULAR | Status: DC

## 2021-08-19 MED ORDER — VANCOMYCIN HCL 1500 MG/300ML IV SOLN
1500.0000 mg | Freq: Two times a day (BID) | INTRAVENOUS | Status: DC
  Administered 2021-08-20 – 2021-08-22 (×5): 1500 mg via INTRAVENOUS
  Filled 2021-08-19 (×7): qty 300

## 2021-08-19 MED ORDER — ACETAMINOPHEN 650 MG RE SUPP: 650.0000 mg | Freq: Four times a day (QID) | RECTAL | Status: DC | PRN

## 2021-08-19 MED ORDER — SODIUM CHLORIDE 0.9 % IV SOLN
2.0000 g | INTRAVENOUS | Status: DC
  Administered 2021-08-19 – 2021-08-20 (×2): 2 g via INTRAVENOUS
  Filled 2021-08-19 (×2): qty 20

## 2021-08-19 MED ORDER — ACETAMINOPHEN 325 MG PO TABS
650.0000 mg | ORAL_TABLET | Freq: Four times a day (QID) | ORAL | Status: DC | PRN
  Administered 2021-08-19 – 2021-08-22 (×4): 650 mg via ORAL
  Filled 2021-08-19 (×4): qty 2

## 2021-08-19 MED ORDER — POLYETHYLENE GLYCOL 3350 17 G PO PACK: 17.0000 g | PACK | Freq: Every day | ORAL | Status: DC | PRN

## 2021-08-19 MED ORDER — INSULIN ASPART 100 UNIT/ML IJ SOLN
0.0000 [IU] | Freq: Three times a day (TID) | INTRAMUSCULAR | Status: DC
  Administered 2021-08-20: 2 [IU] via SUBCUTANEOUS
  Administered 2021-08-21: 3 [IU] via SUBCUTANEOUS

## 2021-08-19 MED ORDER — METFORMIN HCL ER 750 MG PO TB24
1500.0000 mg | ORAL_TABLET | Freq: Every day | ORAL | Status: DC
  Administered 2021-08-20 – 2021-08-22 (×2): 1500 mg via ORAL
  Filled 2021-08-19 (×3): qty 2

## 2021-08-19 MED ORDER — INSULIN DETEMIR 100 UNIT/ML ~~LOC~~ SOLN
20.0000 [IU] | Freq: Every day | SUBCUTANEOUS | Status: DC
  Administered 2021-08-19 – 2021-08-21 (×3): 20 [IU] via SUBCUTANEOUS
  Filled 2021-08-19 (×4): qty 0.2

## 2021-08-19 MED ORDER — ENOXAPARIN SODIUM 40 MG/0.4ML IJ SOSY
40.0000 mg | PREFILLED_SYRINGE | INTRAMUSCULAR | Status: DC
  Administered 2021-08-19 – 2021-08-21 (×3): 40 mg via SUBCUTANEOUS
  Filled 2021-08-19 (×3): qty 0.4

## 2021-08-19 MED ORDER — VANCOMYCIN HCL 2000 MG/400ML IV SOLN
2000.0000 mg | Freq: Once | INTRAVENOUS | Status: AC
  Administered 2021-08-19: 2000 mg via INTRAVENOUS
  Filled 2021-08-19: qty 400

## 2021-08-19 MED ORDER — EMPAGLIFLOZIN 25 MG PO TABS
25.0000 mg | ORAL_TABLET | Freq: Every day | ORAL | Status: DC
  Administered 2021-08-20 – 2021-08-22 (×2): 25 mg via ORAL
  Filled 2021-08-19 (×4): qty 1
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
## 2021-08-19 NOTE — Progress Notes (Signed)
Mr. Lagrow had not yet been admitted to hospital on my arrival this morning - I called patient placement and he is next in line, a room is being cleaned for him now.  Direct admission from our clinic to [facility] IM team initiated on 08/17/20 for finger infection (abscess suspected) with osteomyelitis on xray, .  Received Rocephin 1 g IM on 1/5, given no rooms available and > 10 hr wait in the ED waiting room.  It was not expected to take this long for a bed to open up.
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
## 2021-08-19 NOTE — Plan of Care (Signed)
  Problem: Safety: Goal: Ability to remain free from injury will improve Outcome: Progressing   

## 2021-08-19 NOTE — Progress Notes (Signed)
New Admission Note:   Arrival Method: Wheelchair  Mental Orientation:  Telemetry:  Assessment: Completed Skin: bump on upper back bilaterally, red spots on right BKA posterior, right first finger swollen IV:  Pain:  Tubes: None Safety Measures: Safety Fall Prevention Plan has been discussed  Admission:  [facility] Orientation: Patient has been orientated to the room, unit and staff.   Family: none   Orders to be reviewed and implemented. Will continue to monitor the patient. Call light has been placed within reach and bed alarm has been activated.   Dillon Bjork, RN [facility]  Phone: [phone]

## 2021-08-20 LAB — CBC
HCT: 45.3 % (ref 39.0–52.0)
Hemoglobin: 15.2 g/dL (ref 13.0–17.0)
MCH: 31.5 pg (ref 26.0–34.0)
MCHC: 33.6 g/dL (ref 30.0–36.0)
MCV: 94 fL (ref 80.0–100.0)
Platelets: 323 10*3/uL (ref 150–400)
RBC: 4.82 MIL/uL (ref 4.22–5.81)
RDW: 13.8 % (ref 11.5–15.5)
WBC: 8.1 10*3/uL (ref 4.0–10.5)
nRBC: 0 % (ref 0.0–0.2)

## 2021-08-20 LAB — BASIC METABOLIC PANEL
Anion gap: 7 (ref 5–15)
BUN: 14 mg/dL (ref 6–20)
CO2: 23 mmol/L (ref 22–32)
Calcium: 9.1 mg/dL (ref 8.9–10.3)
Chloride: 103 mmol/L (ref 98–111)
Creatinine, Ser: 0.83 mg/dL (ref 0.61–1.24)
GFR, Estimated: >60 mL/min (ref >60)
Glucose, Bld: 102 mg/dL — ABNORMAL HIGH (ref 70–99)
Potassium: 3.8 mmol/L (ref 3.5–5.1)
Sodium: 133 mmol/L — ABNORMAL LOW (ref 135–145)

## 2021-08-20 LAB — GLUCOSE, CAPILLARY
Glucose-Capillary: 122 mg/dL — ABNORMAL HIGH (ref 70–99)
Glucose-Capillary: 99 mg/dL (ref 70–99)
Glucose-Capillary: 111 mg/dL — ABNORMAL HIGH (ref 70–99)
Glucose-Capillary: 124 mg/dL — ABNORMAL HIGH (ref 70–99)

## 2021-08-20 MED ORDER — PIOGLITAZONE HCL 30 MG PO TABS
30.0000 mg | ORAL_TABLET | ORAL | Status: DC
  Administered 2021-08-20 – 2021-08-22 (×2): 30 mg via ORAL
  Filled 2021-08-20 (×2): qty 1

## 2021-08-20 MED ORDER — GABAPENTIN 300 MG PO CAPS: 600.0000 mg | ORAL_CAPSULE | Freq: Every day | ORAL | Status: DC

## 2021-08-20 MED ORDER — GABAPENTIN 300 MG PO CAPS
300.0000 mg | ORAL_CAPSULE | Freq: Every day | ORAL | Status: DC
  Administered 2021-08-20: 300 mg via ORAL
  Filled 2021-08-20: qty 1

## 2021-08-20 MED ORDER — GABAPENTIN 300 MG PO CAPS
300.0000 mg | ORAL_CAPSULE | Freq: Four times a day (QID) | ORAL | Status: DC | PRN
  Administered 2021-08-21 – 2021-08-22 (×3): 300 mg via ORAL
  Filled 2021-08-20 (×3): qty 1

## 2021-08-20 MED ORDER — SODIUM CHLORIDE 0.9 % IV SOLN
3.0000 g | Freq: Three times a day (TID) | INTRAVENOUS | Status: DC
  Administered 2021-08-20 – 2021-08-22 (×6): 3 g via INTRAVENOUS
  Filled 2021-08-20 (×8): qty 8

## 2021-08-20 NOTE — Plan of Care (Signed)
  Problem: Education: Goal: Knowledge of General Education information will improve Description Including pain rating scale, medication(s)/side effects and non-pharmacologic comfort measures Outcome: Progressing   

## 2021-08-20 NOTE — Progress Notes (Signed)
Subjective: The patient was seen at bedside during rounds this morning.  He reports feeling about the same as yesterday.  He is still unable to fully bend his right index finger.  He states that he has some wounds on the bottom of his RLE that have been itchy.  He has been using a moisturizer to relieve the itch.  No other complaints or concerns today.  Objective:  Vital signs in last 24 hours: Vitals:   08/19/21 1750 08/19/21 2005 08/19/21 2353 08/20/21 0445  BP: (!) 141/88 (!) 141/78 123/77 (!) 150/85  Pulse: 90 66 62 63  Resp: 18 14 14 16   Temp: 98 F (36.7 C) 98.2 F (36.8 C) 97.7 F (36.5 C) 97.9 F (36.6 C)  TempSrc: Oral Oral Oral Oral  SpO2: 98% 92% 94% 95%  Weight:      Height:       Physical Exam Constitutional:      General: He is not in acute distress.    Appearance: Normal appearance.  HENT:     Head: Normocephalic and atraumatic.  Eyes:     Extraocular Movements: Extraocular movements intact.     Pupils: Pupils are equal, round, and reactive to light.  Cardiovascular:     Rate and Rhythm: Normal rate and regular rhythm.     Heart sounds: No murmur heard.   No friction rub. No gallop.  Pulmonary:     Effort: Pulmonary effort is normal.     Breath sounds: Normal breath sounds. No wheezing, rhonchi or rales.  Abdominal:     General: Abdomen is flat. There is no distension.  Musculoskeletal:     Comments:  Right index finger is erythematous and edematous. Fluctuance and TTP at DIP joint. No active bleeding noted. Unable to fully flex the DIP joint.  S/p Right knee BKA S/p Left second and third toe amputations. There is a 2 x 2 cm callus on the side of the first MT. A smaller callus is located on the lateral aspect of the left foot. No active signs of bleeding or drainage. Skin:    General: Skin is warm and dry.     Comments: There are several erythematous macules with overlying plaques on the lateral and distal aspects of the RLE s/p BKA (see media tab for  photos). Neurological:     General: No focal deficit present.     Mental Status: He is alert and oriented to person, place, and time.     Assessment/Plan:  Active Problems:   Cellulitis of finger of right hand   Osteomyelitis of finger of right hand (HCC)  #Cellulitis, osteomyelitis of right index finger Patient with right index finger cellulitis and concern for abscess, also with imaging findings showing osteomyelitis involving tuft distal phalanx right index finger. CRP 1.7, ESR 25. Pt has been stable on abx regimen as we await orthopedic surgery evaluation.  -Orthopedics consulted, appreciate their recs -Vancomycin and Unasyn per pharmacy   #Poorly-controlled T2DM #Diabetic heel ulcer s/p R BKA, osteomyelitis s/p amputation of left second and third toes Patient takes Novolog 16-20 units w meals, Tresiba 10 units at night, Metformin 1500, Actos 30 mg every other day, and Jardiance 25 mg at home. S/p multiple amputations d/t diabetic complications. Most recent A1c of 7.0 on 01/05. CBGs well-controlled at this time. -Levemir 20 U qhs, SSI with meals -Metformin 1500 mg po daily -Jardiance 25 mg po daily -Actos every other day   #HLD Most recent lipid panel shows total cholesterol  of 155, LDL 77, HDL 58. Pt is on pravastatin at home. -Continue pravastatin 80 mg p.o. daily   #HTN Documented h/o HTN, not on any anti-hypertensives at home. BP 133/87 most recently. Will continue to monitor.  Prior to Admission Living Arrangement: Home Anticipated Discharge Location: Home Barriers to Discharge: Medical stability, surgery Dispo: Anticipated discharge in approximately 2-3 day(s).   Orvis Brill, MD 08/20/2021, 6:33 AM Pager: [phone]  After 5pm on weekdays and 1pm on weekends: On Call pager [phone]
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
## 2021-08-20 NOTE — Progress Notes (Signed)
Hand Surgery:  I have Gregory May on my schedule for surgery tomorrow afternoon. I will discuss with patient in detail tomorrow morning on rounds. Plan will be for right index finger I&D, bone biopsy and possible partial amputation, but will discuss with patient the risks, benefits and alternatives of each option prior to proceeding. Please make NPO at midnight except sips for meds.   [name], MD Orthopaedic Hand Surgeon

## 2021-08-21 ENCOUNTER — Inpatient Hospital Stay (HOSPITAL_COMMUNITY): Payer: Medicare HMO | Admitting: Certified Registered Nurse Anesthetist

## 2021-08-21 ENCOUNTER — Encounter (HOSPITAL_COMMUNITY): Payer: Self-pay | Admitting: Internal Medicine

## 2021-08-21 ENCOUNTER — Encounter (HOSPITAL_COMMUNITY)
Admission: AD | Disposition: A | Discharge status: Home / Self Care | Payer: Self-pay | Source: Ambulatory Visit | Attending: Internal Medicine

## 2021-08-21 ENCOUNTER — Telehealth: Payer: Self-pay

## 2021-08-21 DIAGNOSIS — L03011 Cellulitis of right finger: Secondary | ICD-10-CM

## 2021-08-21 DIAGNOSIS — M869 Osteomyelitis, unspecified: Secondary | ICD-10-CM | POA: Diagnosis not present

## 2021-08-21 HISTORY — PX: AMPUTATION: SHX166

## 2021-08-21 HISTORY — PX: I & D EXTREMITY: SHX5045

## 2021-08-21 LAB — GLUCOSE, CAPILLARY
Glucose-Capillary: 115 mg/dL — ABNORMAL HIGH (ref 70–99)
Glucose-Capillary: 97 mg/dL (ref 70–99)
Glucose-Capillary: 81 mg/dL (ref 70–99)
Glucose-Capillary: 184 mg/dL — ABNORMAL HIGH (ref 70–99)
Glucose-Capillary: 167 mg/dL — ABNORMAL HIGH (ref 70–99)

## 2021-08-21 LAB — CBC
HCT: 48.4 % (ref 39.0–52.0)
Hemoglobin: 16.4 g/dL (ref 13.0–17.0)
MCH: 31.5 pg (ref 26.0–34.0)
MCHC: 33.9 g/dL (ref 30.0–36.0)
MCV: 92.9 fL (ref 80.0–100.0)
Platelets: 351 10*3/uL (ref 150–400)
RBC: 5.21 MIL/uL (ref 4.22–5.81)
RDW: 13.7 % (ref 11.5–15.5)
WBC: 7.3 10*3/uL (ref 4.0–10.5)
nRBC: 0 % (ref 0.0–0.2)

## 2021-08-21 LAB — SURGICAL PCR SCREEN
MRSA, PCR: NEGATIVE
Staphylococcus aureus: POSITIVE — AB

## 2021-08-21 SURGERY — IRRIGATION AND DEBRIDEMENT EXTREMITY
Anesthesia: Monitor Anesthesia Care | Site: Hand | Laterality: Right

## 2021-08-21 MED ORDER — LIDOCAINE HCL (PF) 1 % IJ SOLN
INTRAMUSCULAR | Status: AC
  Filled 2021-08-21: qty 30

## 2021-08-21 MED ORDER — DEXAMETHASONE SODIUM PHOSPHATE 10 MG/ML IJ SOLN
INTRAMUSCULAR | Status: AC
  Filled 2021-08-21: qty 1

## 2021-08-21 MED ORDER — PROPOFOL 10 MG/ML IV BOLUS
INTRAVENOUS | Status: DC | PRN
Start: 2021-08-21 — End: 2021-08-21
  Administered 2021-08-21: 50 mg via INTRAVENOUS

## 2021-08-21 MED ORDER — FENTANYL CITRATE (PF) 250 MCG/5ML IJ SOLN
INTRAMUSCULAR | Status: AC
  Filled 2021-08-21: qty 5

## 2021-08-21 MED ORDER — BACITRACIN ZINC 500 UNIT/GM EX OINT
TOPICAL_OINTMENT | CUTANEOUS | Status: DC | PRN
  Administered 2021-08-21: 1 via TOPICAL

## 2021-08-21 MED ORDER — KETOROLAC TROMETHAMINE 30 MG/ML IJ SOLN
INTRAMUSCULAR | Status: AC
  Filled 2021-08-21: qty 1

## 2021-08-21 MED ORDER — SODIUM CHLORIDE 0.9 % IR SOLN
Status: DC | PRN
  Administered 2021-08-21: 1

## 2021-08-21 MED ORDER — PROPOFOL 10 MG/ML IV BOLUS
INTRAVENOUS | Status: AC
  Filled 2021-08-21: qty 20

## 2021-08-21 MED ORDER — LIDOCAINE 2% (20 MG/ML) 5 ML SYRINGE
INTRAMUSCULAR | Status: AC
  Filled 2021-08-21: qty 5

## 2021-08-21 MED ORDER — BUPIVACAINE HCL (PF) 0.25 % IJ SOLN
INTRAMUSCULAR | Status: DC | PRN
  Administered 2021-08-21: 10 mL

## 2021-08-21 MED ORDER — MUPIROCIN 2 % EX OINT
1.0000 "application " | TOPICAL_OINTMENT | Freq: Two times a day (BID) | CUTANEOUS | Status: DC
  Administered 2021-08-21 – 2021-08-22 (×3): 1 via NASAL
  Filled 2021-08-21: qty 22

## 2021-08-21 MED ORDER — CHLORHEXIDINE GLUCONATE 0.12 % MT SOLN: 15.0000 mL | Freq: Once | OROMUCOSAL | Status: AC

## 2021-08-21 MED ORDER — CHLORHEXIDINE GLUCONATE 4 % EX LIQD
60.0000 mL | Freq: Once | CUTANEOUS | Status: DC
  Filled 2021-08-21: qty 60

## 2021-08-21 MED ORDER — PROPOFOL 500 MG/50ML IV EMUL
INTRAVENOUS | Status: DC | PRN
  Administered 2021-08-21: 100 ug/kg/min via INTRAVENOUS

## 2021-08-21 MED ORDER — CHLORHEXIDINE GLUCONATE 0.12 % MT SOLN
OROMUCOSAL | Status: AC
  Administered 2021-08-21: 15 mL via OROMUCOSAL
  Filled 2021-08-21: qty 15

## 2021-08-21 MED ORDER — LACTATED RINGERS IV SOLN: INTRAVENOUS | Status: DC

## 2021-08-21 MED ORDER — POVIDONE-IODINE 10 % EX SWAB: 2.0000 "application " | Freq: Once | CUTANEOUS | Status: DC

## 2021-08-21 MED ORDER — ONDANSETRON HCL 4 MG/2ML IJ SOLN
INTRAMUSCULAR | Status: DC | PRN
  Administered 2021-08-21: 4 mg via INTRAVENOUS

## 2021-08-21 MED ORDER — FENTANYL CITRATE (PF) 250 MCG/5ML IJ SOLN
INTRAMUSCULAR | Status: DC | PRN
  Administered 2021-08-21: 50 ug via INTRAVENOUS

## 2021-08-21 MED ORDER — BACITRACIN ZINC 500 UNIT/GM EX OINT
TOPICAL_OINTMENT | CUTANEOUS | Status: AC
  Filled 2021-08-21: qty 28.35

## 2021-08-21 MED ORDER — ORAL CARE MOUTH RINSE: 15.0000 mL | Freq: Once | OROMUCOSAL | Status: AC

## 2021-08-21 MED ORDER — CHLORHEXIDINE GLUCONATE CLOTH 2 % EX PADS
6.0000 | MEDICATED_PAD | Freq: Every day | CUTANEOUS | Status: DC
  Administered 2021-08-21 – 2021-08-22 (×2): 6 via TOPICAL

## 2021-08-21 MED ORDER — LIDOCAINE HCL (PF) 1 % IJ SOLN
INTRAMUSCULAR | Status: DC | PRN
Start: 2021-08-21 — End: 2021-08-21
  Administered 2021-08-21: 10 mL

## 2021-08-21 MED ORDER — BUPIVACAINE HCL (PF) 0.25 % IJ SOLN
INTRAMUSCULAR | Status: AC
  Filled 2021-08-21: qty 30

## 2021-08-21 MED ORDER — ONDANSETRON HCL 4 MG/2ML IJ SOLN
INTRAMUSCULAR | Status: AC
  Filled 2021-08-21: qty 2

## 2021-08-21 SURGICAL SUPPLY — 47 items
BAG COUNTER SPONGE SURGICOUNT (BAG) ×2 IMPLANT
BNDG COHESIVE 1X5 TAN STRL LF (GAUZE/BANDAGES/DRESSINGS) ×1 IMPLANT
BNDG CONFORM 2 STRL LF (GAUZE/BANDAGES/DRESSINGS) IMPLANT
BNDG ELASTIC 3X5.8 VLCR STR LF (GAUZE/BANDAGES/DRESSINGS) ×2 IMPLANT
CORD BIPOLAR FORCEPS 12FT (ELECTRODE) ×2 IMPLANT
COVER SURGICAL LIGHT HANDLE (MISCELLANEOUS) ×2 IMPLANT
CUFF TOURN SGL QUICK 18X4 (TOURNIQUET CUFF) ×2 IMPLANT
DRAPE SURG 17X23 STRL (DRAPES) ×2 IMPLANT
DRSG ADAPTIC 3X8 NADH LF (GAUZE/BANDAGES/DRESSINGS) ×2 IMPLANT
DRSG EMULSION OIL 3X3 NADH (GAUZE/BANDAGES/DRESSINGS) ×1 IMPLANT
ELECT REM PT RETURN 9FT ADLT (ELECTROSURGICAL)
ELECTRODE REM PT RTRN 9FT ADLT (ELECTROSURGICAL) IMPLANT
GAUZE SPONGE 2X2 8PLY STRL LF (GAUZE/BANDAGES/DRESSINGS) IMPLANT
GAUZE SPONGE 4X4 12PLY STRL (GAUZE/BANDAGES/DRESSINGS) ×2 IMPLANT
GLOVE SURG ENC TEXT LTX SZ8 (GLOVE) ×2 IMPLANT
GLOVE SURG MICRO LTX SZ8 (GLOVE) ×2 IMPLANT
GLOVE SURG PR MICRO ENCORE 7.5 (GLOVE) ×2 IMPLANT
GOWN STRL REUS W/ TWL LRG LVL3 (GOWN DISPOSABLE) ×3 IMPLANT
GOWN STRL REUS W/ TWL XL LVL3 (GOWN DISPOSABLE) ×2 IMPLANT
GOWN STRL REUS W/TWL LRG LVL3 (GOWN DISPOSABLE) ×6
GOWN STRL REUS W/TWL XL LVL3 (GOWN DISPOSABLE) ×4
HANDPIECE INTERPULSE COAX TIP (DISPOSABLE)
KIT BASIN OR (CUSTOM PROCEDURE TRAY) ×2 IMPLANT
KIT TURNOVER KIT B (KITS) ×2 IMPLANT
MANIFOLD NEPTUNE II (INSTRUMENTS) ×2 IMPLANT
NDL HYPO 25GX1X1/2 BEV (NEEDLE) IMPLANT
NEEDLE HYPO 25GX1X1/2 BEV (NEEDLE) IMPLANT
NS IRRIG 1000ML POUR BTL (IV SOLUTION) ×2 IMPLANT
PACK ORTHO EXTREMITY (CUSTOM PROCEDURE TRAY) ×2 IMPLANT
PAD ARMBOARD 7.5X6 YLW CONV (MISCELLANEOUS) ×4 IMPLANT
PAD CAST 4YDX4 CTTN HI CHSV (CAST SUPPLIES) ×1 IMPLANT
PADDING CAST COTTON 4X4 STRL (CAST SUPPLIES) ×1
SET CYSTO W/LG BORE CLAMP LF (SET/KITS/TRAYS/PACK) IMPLANT
SET HNDPC FAN SPRY TIP SCT (DISPOSABLE) IMPLANT
SOAP 2 % CHG 4 OZ (WOUND CARE) ×2 IMPLANT
SOL PREP POV-IOD 4OZ 10% (MISCELLANEOUS) ×6 IMPLANT
SPECIMEN JAR SMALL (MISCELLANEOUS) ×2 IMPLANT
SPONGE GAUZE 2X2 STER 10/PKG (GAUZE/BANDAGES/DRESSINGS)
SPONGE T-LAP 4X18 ~~LOC~~+RFID (SPONGE) ×2 IMPLANT
SUT CHROMIC 4 0 PS 2 18 (SUTURE) ×1 IMPLANT
SYR CONTROL 10ML LL (SYRINGE) IMPLANT
TOWEL GREEN STERILE (TOWEL DISPOSABLE) ×2 IMPLANT
TOWEL GREEN STERILE FF (TOWEL DISPOSABLE) ×2 IMPLANT
TUBE CONNECTING 12X1/4 (SUCTIONS) ×2 IMPLANT
UNDERPAD 30X36 HEAVY ABSORB (UNDERPADS AND DIAPERS) ×2 IMPLANT
WATER STERILE IRR 1000ML POUR (IV SOLUTION) ×2 IMPLANT
YANKAUER SUCT BULB TIP NO VENT (SUCTIONS) ×2 IMPLANT

## 2021-08-21 NOTE — Progress Notes (Addendum)
Subjective: The patient was seen at bedside during rounds this morning.  He reports feeling about the same as yesterday.  He is still unable to fully bend his right index finger. No other complaints or concerns today.  Objective:  Vital signs in last 24 hours: Vitals:   08/20/21 0937 08/20/21 1651 08/20/21 2056 08/21/21 0424  BP: 133/87 134/82 (!) 164/93 (!) 146/93  Pulse: 70 (!) 58 61 68  Resp: 18 18 18 18   Temp: (!) 97.4 F (36.3 C) 98 F (36.7 C) 97.9 F (36.6 C) 98 F (36.7 C)  TempSrc: Oral Oral Oral Oral  SpO2: 94% 96% 98% 91%  Weight:      Height:       Physical Exam Constitutional:      General: He is not in acute distress.    Appearance: Normal appearance.  HENT:     Head: Normocephalic and atraumatic.  Eyes:     Extraocular Movements: Extraocular movements intact.     Pupils: Pupils are equal, round, and reactive to light.  Cardiovascular:     Rate and Rhythm: Normal rate and regular rhythm.     Heart sounds: No murmur heard.   No friction rub. No gallop.  Pulmonary:     Effort: Pulmonary effort is normal.     Breath sounds: Normal breath sounds. No wheezing, rhonchi or rales.  Abdominal:     General: Abdomen is flat. There is no distension.  Musculoskeletal:     Comments:  Right index finger is erythematous and edematous. Fluctuance and TTP at DIP joint. No active bleeding noted. Unable to fully flex the DIP joint.  Skin:    General: Skin is warm and dry.  Neurological:     General: No focal deficit present.     Mental Status: He is alert and oriented to person, place, and time.     Afebrile and HDS  Cellulitis concern for abscess and osteo, will get right index finger I&D, bone biopsy, and possible partial amputation.   Assessment/Plan:  Active Problems:   Cellulitis of finger of right hand   Osteomyelitis of finger of right hand (HCC)  #Cellulitis, osteomyelitis of right index finger Patient with right index finger cellulitis and concern  for abscess, also with imaging findings showing osteomyelitis involving tuft distal phalanx right index finger. Pt has been stable on abx regimen. Plan for right index finger I&D, bone biopsy, and possible partial amputation this afternoon. -Orthopedics consulted, appreciate their recs -Surgery this PM -Vancomycin and Unasyn per pharmacy   #T2DM, well controlled #Diabetic heel ulcer s/p R BKA, osteomyelitis s/p amputation of left second and third toes Patient takes Novolog 16-20 units w meals, Tresiba 10 units at night, Metformin 1500, Actos 30 mg every other day, and Jardiance 25 mg at home. S/p multiple amputations d/t diabetic complications. Most recent A1c of 7.0 on 01/05. CBGs are well-controlled at this time. -Levemir 20 U qhs, SSI with meals -Metformin 1500 mg po daily -Jardiance 25 mg po daily -Actos every other day   #HLD Most recent lipid panel shows total cholesterol of 155, LDL 77, HDL 58. Pt is on pravastatin at home. -Continue pravastatin 80 mg p.o. daily   #HTN Documented h/o HTN, not on any anti-hypertensives at home. Will continue to monitor.  Prior to Admission Living Arrangement: Home Anticipated Discharge Location: Home Barriers to Discharge: Medical stability, surgery Dispo: Anticipated discharge in approximately 1-2 day(s).   03/05, MD 08/21/2021, 6:31 AM Pager: [phone]  After  5pm on weekdays and 1pm on weekends: On Call pager [phone]

## 2021-08-21 NOTE — Telephone Encounter (Signed)
Patient called into the office stating that is is in the hospital and going to have surgery on his index finger. He stated he didn't have a choice on the doctor so he would like to know if he can have a call to ease his mind regarding his procedure preformed by this doctor, he just feels more comfortable with Dr. Lajoyce Corners.

## 2021-08-21 NOTE — Consult Note (Signed)
Orthopedic Hand Surgery Consultation:  Reason for Consult: Right index finger infection Referring Physician: Dr.Bonanno   HPI: Gregory May is a(an) 61 y.o. male who presents with a right index finger infection.  He is a diabetic and has had previous amputations to his toes and his right below-knee amputation.  He now presents with right index finger osteomyelitis to the distal phalanx.  He is bony erosion changes on x-ray.  He has no sensation distal to the mid aspect of the middle phalanx of the right index finger.   Physical Exam: Right upper Extremity Right index finger chronic appearing infection with an open wound over the tip of the right index finger.  No sensation dorsal or volar past the mid aspect of the middle phalanx.  Able to fire FDS and FDP.  The fingers warm well perfused proximal to the necrotic area around his distal phalanx.   Assessment/Plan: Right index finger chronic osteomyelitis  X-ray 3 views of the right index finger shows bony erosion to the distal phalanx of the right index finger.  Discussed the risk and benefits of proceeding with surgical irrigation debridement and partial irritation to the right index finger.  We did discuss imitation through the distal interphalangeal joint versus transphalangeal through the middle phalanx versus ray resection.  At this time his infection appears to be localized to the distal phalanx.  He does have preserved FDS function and healthy appearing skin from the mid aspect of the middle phalanx and proximal.  His sensation is altered distal to the mid aspect of the middle phalanx of the right index finger and intact proximal to this location.  After long discussion we decided on this location for his amputation site.  He is okay with this proceeding forward.  We did discuss residual infection which could be present as well as stiffness and loss of function.  We did discuss ray resection as an alternative as he already seems to  be bypassing the index finger and utilizing his middle finger for the majority of his function.  He would like to preserve as much as possible and I think this is reasonable.  Plan for right index finger I&D and transphalangeal amputation through the middle aspect of the middle phalanx preserving the FDS insertion.   Gregory Dad, MD Orthopaedic Hand Surgeon [facility] Office number: [phone] [address], [address] [place], [place] [address]    Past Medical History:  Diagnosis Date   Arthritis    bilateral hips   Cutaneous abscess of left foot    Deep vein thrombosis (DVT) (HCC)    Diabetes mellitus    type II   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 Date of diagnosis February 08 2014 Duration of anticoagulation: 6 months. End date 08/12/2014.  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 Duration of anticoagulation: 8 months. End date 10/11/2014.  Anticoagulant: Lovenox 120 units daily Switched to Eliquis on 05/25/2014 per patient preference    Pulmonary embolism (HCC)  Sebaceous cyst    on back of neck    Past Surgical History:  Procedure Laterality Date   AMPUTATION Right 06/03/2015   Procedure: Right Below Knee Amputation;  Surgeon: [name], MD;  Location: [facility];  Service: Orthopedics;  Laterality: Right;   AMPUTATION Left 07/24/2019   Procedure: LEFT FOOT 3RD RAY AMPUTATION;  Surgeon: [name], MD;  Location: [facility];  Service: Orthopedics;  Laterality: Left;   AMPUTATION Left 06/23/2021    Procedure: LEFT 2ND TOE AMPUTATION;  Surgeon: [name], MD;  Location: [facility];  Service: Orthopedics;  Laterality: Left;   CLOSED REDUCTION WITH HUMER PIN INSERTION  1974   left hip   HARDWARE REMOVAL Left 07/21/2014   Procedure: HARDWARE REMOVAL;  Surgeon: Ninetta Lights, MD;  Location: [facility];  Service: Orthopedics;  Laterality: Left;   ROTATOR CUFF REPAIR Right 2005 (approx)   TOTAL HIP ARTHROPLASTY Left 07/21/2014   Procedure: TOTAL HIP ARTHROPLASTY ANTERIOR APPROACH;  Surgeon: Ninetta Lights, MD;  Location: [facility];  Service: Orthopedics;  Laterality: Left;   TOTAL HIP ARTHROPLASTY Right 2006 (approx)   right hip replaced    Family History  Problem Relation Age of Onset   Breast cancer Mother    Cancer Mother        small intestine   Liver cancer Mother    Diabetes Mother    Diabetes Father    Diabetes Brother    Hypertension Maternal Grandmother    Heart Problems Maternal Grandmother    Diabetes Paternal Grandmother    Diabetes Paternal Grandfather    Diabetes Brother     Social History:  reports that he has never smoked. He has never used smokeless tobacco. He reports that he does not currently use alcohol. He reports that he does not use drugs.  Allergies: No Known Allergies  Medications: reviewed, no changes to patient's home medications  Results for orders placed or performed during the hospital encounter of 08/19/21 (from the past 48 hour(s))  Resp Panel by RT-PCR (Flu A&B, Covid) Nasopharyngeal Swab     Status: None   Collection Time: 08/19/21  2:18 PM   Specimen: Nasopharyngeal Swab; Nasopharyngeal(NP) swabs in vial transport medium  Result Value Ref Range   SARS Coronavirus 2 by RT PCR NEGATIVE NEGATIVE    Comment: (NOTE) SARS-CoV-2 target nucleic acids are NOT DETECTED.  The SARS-CoV-2 RNA is generally detectable in upper respiratory specimens during the acute phase of infection. The lowest concentration of SARS-CoV-2 viral copies this assay can detect  is 138 copies/mL. A negative result does not preclude SARS-Cov-2 infection and should not be used as the sole basis for treatment or other patient management decisions. A negative result may occur with  improper specimen collection/handling, submission of specimen other than nasopharyngeal swab, presence of viral mutation(s) within the areas targeted by this assay, and inadequate number of viral copies(<138 copies/mL). A negative result must be combined with clinical observations, patient history, and epidemiological information. The expected result is Negative.  Fact Sheet for Patients:  EntrepreneurPulse.com.au  Fact Sheet for Healthcare Providers:  IncredibleEmployment.be  This test is no t yet approved or cleared by the [place] FDA and  has been authorized for detection and/or diagnosis of SARS-CoV-2 by FDA under an Emergency Use Authorization (EUA). This EUA will remain  in effect (meaning this test can be used) for the duration of the COVID-19 declaration under Section 564(b)(1) of the Act, 21 U.S.C.section 360bbb-3(b)(1), unless the authorization is  terminated  or revoked sooner.       Influenza A by PCR NEGATIVE NEGATIVE   Influenza B by PCR NEGATIVE NEGATIVE    Comment: (NOTE) The Xpert Xpress SARS-CoV-2/FLU/RSV plus assay is intended as an aid in the diagnosis of influenza from Nasopharyngeal swab specimens and should not be used as a sole basis for treatment. Nasal washings and aspirates are unacceptable for Xpert Xpress SARS-CoV-2/FLU/RSV testing.  Fact Sheet for Patients: EntrepreneurPulse.com.au  Fact Sheet for Healthcare Providers: IncredibleEmployment.be  This test is not yet approved or cleared by the [place] FDA and has been authorized for detection and/or diagnosis of SARS-CoV-2 by FDA under an Emergency Use Authorization (EUA). This EUA will remain in effect (meaning this  test can be used) for the duration of the COVID-19 declaration under Section 564(b)(1) of the Act, 21 U.S.C. section 360bbb-3(b)(1), unless the authorization is terminated or revoked.  Performed at [facility], [place] [address], [place], [place] [address]   HIV Antibody (routine testing w rflx)     Status: None   Collection Time: 08/19/21  2:47 PM  Result Value Ref Range   HIV Screen 4th Generation wRfx Non Reactive Non Reactive    Comment: Performed at [facility], [place] [address], [place], [place] [address]  Comprehensive metabolic panel     Status: Abnormal   Collection Time: 08/19/21  2:47 PM  Result Value Ref Range   Sodium 138 135 - 145 mmol/L   Potassium 4.1 3.5 - 5.1 mmol/L   Chloride 105 98 - 111 mmol/L   CO2 24 22 - 32 mmol/L   Glucose, Bld 111 (H) 70 - 99 mg/dL    Comment: Glucose reference range applies only to samples taken after fasting for at least 8 hours.   BUN 14 6 - 20 mg/dL   Creatinine, Ser 0.88 0.61 - 1.24 mg/dL   Calcium 8.9 8.9 - 10.3 mg/dL   Total Protein 6.8 6.5 - 8.1 g/dL   Albumin 3.2 (L) 3.5 - 5.0 g/dL   AST 47 (H) 15 - 41 U/L   ALT 45 (H) 0 - 44 U/L   Alkaline Phosphatase 54 38 - 126 U/L   Total Bilirubin 0.2 (L) 0.3 - 1.2 mg/dL   GFR, Estimated >60 >60 mL/min    Comment: (NOTE) Calculated using the CKD-EPI Creatinine Equation (2021)    Anion gap 9 5 - 15    Comment: Performed at [facility] [address] [place], [place] [address]  CBC WITH DIFFERENTIAL     Status: None   Collection Time: 08/19/21  2:47 PM  Result Value Ref Range   WBC 8.5 4.0 - 10.5 K/uL   RBC 4.66 4.22 - 5.81 MIL/uL   Hemoglobin 14.8 13.0 - 17.0 g/dL   HCT 44.3 39.0 - 52.0 %   MCV 95.1 80.0 - 100.0 fL   MCH 31.8 26.0 - 34.0 pg   MCHC 33.4 30.0 - 36.0 g/dL   RDW 14.0 11.5 - 15.5 %   Platelets 331 150 - 400 K/uL   nRBC 0.0 0.0 - 0.2 %   Neutrophils Relative % 68 %   Neutro Abs 5.7 1.7 - 7.7 K/uL   Lymphocytes Relative 21 %   Lymphs Abs 1.8 0.7 -  4.0 K/uL   Monocytes Relative 9 %   Monocytes Absolute 0.8 0.1 - 1.0 K/uL   Eosinophils Relative 1 %   Eosinophils Absolute 0.1 0.0 - 0.5 K/uL   Basophils  Relative 1 %   Basophils Absolute 0.1 0.0 - 0.1 K/uL   Immature Granulocytes 0 %   Abs Immature Granulocytes 0.02 0.00 - 0.07 K/uL    Comment: Performed at [facility], [place] [address], [place], [place] [address]  Protime-INR     Status: None   Collection Time: 08/19/21  2:47 PM  Result Value Ref Range   Prothrombin Time 13.4 11.4 - 15.2 seconds   INR 1.0 0.8 - 1.2    Comment: (NOTE) INR goal varies based on device and disease states. Performed at [facility], [place] [address], [place], [place] [address]   Glucose, capillary     Status: None   Collection Time: 08/19/21  4:35 PM  Result Value Ref Range   Glucose-Capillary 99 70 - 99 mg/dL    Comment: Glucose reference range applies only to samples taken after fasting for at least 8 hours.  Sedimentation rate     Status: Abnormal   Collection Time: 08/19/21  4:50 PM  Result Value Ref Range   Sed Rate 25 (H) 0 - 16 mm/hr    Comment: Performed at [facility] [address], [place], [place] [address]  C-reactive protein     Status: Abnormal   Collection Time: 08/19/21  4:50 PM  Result Value Ref Range   CRP 1.7 (H) <1.0 mg/dL    Comment: Performed at [facility], [place] [address], [place], [place] [address]  Glucose, capillary     Status: Abnormal   Collection Time: 08/19/21 10:13 PM  Result Value Ref Range   Glucose-Capillary 116 (H) 70 - 99 mg/dL    Comment: Glucose reference range applies only to samples taken after fasting for at least 8 hours.  CBC     Status: None   Collection Time: 08/20/21  2:19 AM  Result Value Ref Range   WBC 8.1 4.0 - 10.5 K/uL   RBC 4.82 4.22 - 5.81 MIL/uL   Hemoglobin 15.2 13.0 - 17.0 g/dL   HCT 45.3 39.0 - 52.0 %   MCV 94.0 80.0 - 100.0 fL   MCH 31.5 26.0 - 34.0 pg   MCHC 33.6 30.0 - 36.0 g/dL   RDW 13.8 11.5 - 15.5  %   Platelets 323 150 - 400 K/uL   nRBC 0.0 0.0 - 0.2 %    Comment: Performed at [facility], [place] [address] [place], [place] [id number]  Basic metabolic panel     Status: Abnormal   Collection Time: 08/20/21  2:19 AM  Result Value Ref Range   Sodium 133 (L) 135 - 145 mmol/L   Potassium 3.8 3.5 - 5.1 mmol/L   Chloride 103 98 - 111 mmol/L   CO2 23 22 - 32 mmol/L   Glucose, Bld 102 (H) 70 - 99 mg/dL    Comment: Glucose reference range applies only to samples taken after fasting for at least 8 hours.   BUN 14 6 - 20 mg/dL   Creatinine, Ser 0.83 0.61 - 1.24 mg/dL   Calcium 9.1 8.9 - 10.3 mg/dL   GFR, Estimated >60 >60 mL/min    Comment: (NOTE) Calculated using the CKD-EPI Creatinine Equation (2021)    Anion gap 7 5 - 15    Comment: Performed at [facility] [address], [place], [place] [address]  Glucose, capillary     Status: Abnormal   Collection Time: 08/20/21  7:24 AM  Result Value Ref Range  Glucose-Capillary 122 (H) 70 - 99 mg/dL    Comment: Glucose reference range applies only to samples taken after fasting for at least 8 hours.  Glucose, capillary     Status: None   Collection Time: 08/20/21 12:21 PM  Result Value Ref Range   Glucose-Capillary 99 70 - 99 mg/dL    Comment: Glucose reference range applies only to samples taken after fasting for at least 8 hours.  Glucose, capillary     Status: Abnormal   Collection Time: 08/20/21  4:50 PM  Result Value Ref Range   Glucose-Capillary 111 (H) 70 - 99 mg/dL    Comment: Glucose reference range applies only to samples taken after fasting for at least 8 hours.  Glucose, capillary     Status: Abnormal   Collection Time: 08/20/21  8:56 PM  Result Value Ref Range   Glucose-Capillary 124 (H) 70 - 99 mg/dL    Comment: Glucose reference range applies only to samples taken after fasting for at least 8 hours.  Surgical pcr screen     Status: Abnormal   Collection Time: 08/21/21 12:19 AM   Specimen: Nasal Mucosa;  Nasal Swab  Result Value Ref Range   MRSA, PCR NEGATIVE NEGATIVE   Staphylococcus aureus POSITIVE (A) NEGATIVE    Comment: (NOTE) The Xpert SA Assay (FDA approved for NASAL specimens in patients 44 years of age and older), is one component of a comprehensive surveillance program. It is not intended to diagnose infection nor to guide or monitor treatment. Performed at [facility], [place] [address] [place], [place] [address]   CBC     Status: None   Collection Time: 08/21/21  6:24 AM  Result Value Ref Range   WBC 7.3 4.0 - 10.5 K/uL   RBC 5.21 4.22 - 5.81 MIL/uL   Hemoglobin 16.4 13.0 - 17.0 g/dL   HCT 48.4 39.0 - 52.0 %   MCV 92.9 80.0 - 100.0 fL   MCH 31.5 26.0 - 34.0 pg   MCHC 33.9 30.0 - 36.0 g/dL   RDW 13.7 11.5 - 15.5 %   Platelets 351 150 - 400 K/uL   nRBC 0.0 0.0 - 0.2 %    Comment: Performed at [facility], [place] [address] [place], [place] [address]  Glucose, capillary     Status: Abnormal   Collection Time: 08/21/21  8:10 AM  Result Value Ref Range   Glucose-Capillary 115 (H) 70 - 99 mg/dL    Comment: Glucose reference range applies only to samples taken after fasting for at least 8 hours.  Glucose, capillary     Status: None   Collection Time: 08/21/21 12:40 PM  Result Value Ref Range   Glucose-Capillary 97 70 - 99 mg/dL    Comment: Glucose reference range applies only to samples taken after fasting for at least 8 hours.    No results found.  ROS: 14 point review of systems negative except per HPI

## 2021-08-21 NOTE — Op Note (Signed)
OPERATIVE NOTE  DATE OF PROCEDURE: 08/21/2021  SURGEONS:  Primary: Orene Desanctis, MD  PREOPERATIVE DIAGNOSIS: right index finger infection, osteomyelitis  POSTOPERATIVE DIAGNOSIS: Same  NAME OF PROCEDURE:   Right index finger transphalangeal amputation through the middle phalanx with traction neurectomies and direct closure  ANESTHESIA: Monitor Anesthesia Care + Local  SKIN PREPARATION: Hibiclens  ESTIMATED BLOOD LOSS: Minimal  IMPLANTS: none  INDICATIONS:  Gregory May is a 61 y.o. male who has the above preoperative diagnosis. The patient has decided to proceed with surgical intervention.  Risks, benefits and alternatives of operative management were discussed including, but not limited to, risks of anesthesia complications, infection, pain, persistent symptoms, stiffness, need for future surgery.  The patient understands, agrees and elects to proceed with surgery.    DESCRIPTION OF PROCEDURE: The patient was met in the pre-operative area and their identity was verified.  The operative location and laterality was also verified and marked.  The patient was brought to the OR and was placed supine on the table.  After repeat patient identification with the operative team anesthesia was provided and the patient was prepped and draped in the usual sterile fashion.  A final timeout was performed verifying the correction patient, procedure, location and laterality.  Patient has been on scheduled antibiotics.  Local anesthesia provided and tourniquet inflated to 250 mmHg.  A fishmouth incision centered over the middle aspect of the middle phalanx was drawn and full-thickness skin flaps were obtained with sharp dissection with a 15 blade scalpel.  A bone cutter was utilized to perform the amputation through the middle phalanx.  A bone rasp was utilized to smooth the edges.  Cultures were obtained and there was no signs of purulence within the wound bed and the cancellous bone appeared healthy.  The level  of the amputation was decided preoperatively with the patient as well as based on the level of healthy skin that was available.  There was significant necrotic skin and subcutaneous tissue and bony erosion of the distal phalanx.  The FDS insertion was preserved.  The wound was thoroughly irrigated with normal saline.  Traction neurectomies were performed of the radial and ulnar digital nerves.  Bipolar electrocautery was utilized for hemostasis.  The skin was then closed with 4-0 chromic suture in a mattress fashion.  A sterile soft bandage was applied followed by loosely applied Coban.  The tourniquet was deflated.  The patient tolerated the procedure well.  All counts were correct x2.  The patient was brought to PACU for recovery in stable condition.  Postoperative plan: Continue IV antibiotics.  We will tailor antibiotics based on cultures.  I did obtain 2 cultures wound culture was from the specimen that was sent to pathology.  The second specimen was of the remaining right index finger stump labeled right index finger post amputation.  Recommend transition to oral antibiotics.  Could consider an infectious disease consultation for recommendations on oral antibiotics.  Patient may perform dressing changes postop day 3 with Baci, Adaptic, 4 x 4's and Coban.  Follow-up in 2 weeks in my office.  Izell Rule, MD Orthopaedic Hand Surgeon [facility] Office number: [phone] [address] [place], [place] [address]

## 2021-08-21 NOTE — Plan of Care (Signed)
  Problem: Education: Goal: Knowledge of General Education information will improve Description Including pain rating scale, medication(s)/side effects and non-pharmacologic comfort measures Outcome: Progressing   Problem: Health Behavior/Discharge Planning: Goal: Ability to manage health-related needs will improve Outcome: Progressing   

## 2021-08-21 NOTE — Anesthesia Preprocedure Evaluation (Signed)
Anesthesia Evaluation  Patient identified by MRN, date of birth, ID band Patient awake    Reviewed: Allergy & Precautions, NPO status , Patient's Chart, lab work & pertinent test results  Airway Mallampati: II  TM Distance: >3 FB     Dental   Pulmonary    breath sounds clear to auscultation       Cardiovascular hypertension,  Rhythm:Regular Rate:Normal     Neuro/Psych  Neuromuscular disease    GI/Hepatic negative GI ROS, Neg liver ROS,   Endo/Other  diabetes  Renal/GU negative Renal ROS     Musculoskeletal  (+) Arthritis ,   Abdominal   Peds  Hematology   Anesthesia Other Findings   Reproductive/Obstetrics                             Anesthesia Physical Anesthesia Plan  ASA: 3  Anesthesia Plan: MAC   Post-op Pain Management:    Induction: Intravenous  PONV Risk Score and Plan: Treatment may vary due to age or medical condition, Ondansetron, Midazolam and Dexamethasone  Airway Management Planned: Simple Face Mask  Additional Equipment:   Intra-op Plan:   Post-operative Plan:   Informed Consent: I have reviewed the patients History and Physical, chart, labs and discussed the procedure including the risks, benefits and alternatives for the proposed anesthesia with the patient or authorized representative who has indicated his/her understanding and acceptance.     Dental advisory given  Plan Discussed with: CRNA and Anesthesiologist  Anesthesia Plan Comments:         Anesthesia Quick Evaluation

## 2021-08-21 NOTE — Telephone Encounter (Signed)
Please call pt- see message below.

## 2021-08-21 NOTE — TOC Initial Note (Signed)
Transition of Care [facility]) - Initial/Assessment Note    Patient Details  Name: Gregory May MRN: [id number] Date of Birth: [birth year]  Transition of Care [facility]) CM/SW Contact:    Milinda Antis, [place] Phone Number: 08/21/2021, 4:35 PM  Clinical Narrative:                 Transition of Care Department [facility]) has reviewed patient and no TOC needs have been identified at this time. We will continue to monitor patient advancement through interdisciplinary progression rounds. If new patient transition needs arise, please place a TOC consult.        Patient Goals and CMS Choice        Expected Discharge Plan and Services                                                Prior Living Arrangements/Services                       Activities of Daily Living Home Assistive Devices/Equipment: Eyeglasses, Shower chair with back, Crutches ADL Screening (condition at time of admission) Patient's cognitive ability adequate to safely complete daily activities?: Yes Is the patient deaf or have difficulty hearing?: Yes Does the patient have difficulty seeing, even when wearing glasses/contacts?: No Does the patient have difficulty concentrating, remembering, or making decisions?: No Patient able to express need for assistance with ADLs?: No Does the patient have difficulty dressing or bathing?: No Independently performs ADLs?: Yes (appropriate for developmental age) Does the patient have difficulty walking or climbing stairs?: No Weakness of Legs: None Weakness of Arms/Hands: None  Permission Sought/Granted                  Emotional Assessment              Admission diagnosis:  Osteomyelitis ([place]) [M86.9] Patient Active Problem List   Diagnosis Date Noted   Osteomyelitis of finger of right hand ([place]) 08/19/2021   Cellulitis of finger of right hand 08/17/2021   Osteomyelitis of second toe of left foot ([place])    Hepatic steatosis 06/24/2020   Back pain  04/20/2020   Flu vaccine need 04/20/2020   COVID-19 virus vaccination declined 04/20/2020   Left knee pain 04/20/2020   Allergic sinusitis 04/20/2020   Elevated liver enzymes 04/20/2020   Diastasis of rectus abdominis 07/16/2019   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 10/07/2018   Essential hypertension 06/19/2018   Peripheral neuropathy 12/19/2017   Acquired absence of right leg below knee ([place]) 04/17/2017   Carpal tunnel syndrome 03/18/2017   Colon cancer screening 06/21/2015   Status post below knee amputation of right lower extremity ([place]) 06/07/2015   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 ([place]) 06/01/2015   Diabetic polyneuropathy associated with type 2 diabetes mellitus ([place]) 12/09/2014   Diabetes mellitus with carpal tunnel syndrome ([place]) 12/09/2014   DJD (degenerative joint disease) of knee 07/21/2014   Osteoarthritis, hip, bilateral 05/25/2014   Onychomycosis 10/14/2013   Pure hypercholesterolemia 09/25/2013   Diabetic foot ulcer ([place]) 09/15/2013   Poorly controlled type 2 diabetes mellitus with complication ([place]) [id number]   PCP:  Timothy Lasso, MD Pharmacy:   [facility] [place] [address] Phone: [phone] Fax: [phone]  [facility] - [place], [place] - [address] [place] [address] Phone: [phone] Fax: [phone]     Social Determinants of Health (SDOH) Interventions    Readmission Risk Interventions No flowsheet data found.

## 2021-08-21 NOTE — Anesthesia Postprocedure Evaluation (Signed)
Anesthesia Post Note  Patient: Gregory May  Procedure(s) Performed: IRRIGATION AND DEBRIDEMENT RIGHT INDEX FINGER (Right: Hand) AMPUTATION RIGHT INDEX FINGER (Right: Hand)     Patient location during evaluation: PACU Anesthesia Type: MAC Level of consciousness: awake Pain management: pain level controlled Vital Signs Assessment: post-procedure vital signs reviewed and stable Respiratory status: spontaneous breathing Cardiovascular status: stable Postop Assessment: no apparent nausea or vomiting Anesthetic complications: no   No notable events documented.  Last Vitals:  Vitals:   08/21/21 1530 08/21/21 1608  BP: (!) 154/88 133/81  Pulse: 64 64  Resp: 18 18  Temp: 36.8 C 36.6 C  SpO2: 95% 92%    Last Pain:  Vitals:   08/21/21 1608  TempSrc: Oral  PainSc:                  Nikolaos Maddocks

## 2021-08-21 NOTE — Transfer of Care (Signed)
Immediate Anesthesia Transfer of Care Note  Patient: Gregory May  Procedure(s) Performed: IRRIGATION AND DEBRIDEMENT RIGHT INDEX FINGER (Right: Hand) AMPUTATION RIGHT INDEX FINGER (Right: Hand)  Patient Location: PACU  Anesthesia Type:MAC  Level of Consciousness: awake, alert  and oriented  Airway & Oxygen Therapy: Patient Spontanous Breathing  Post-op Assessment: Report given to RN, Post -op Vital signs reviewed and stable, Patient moving all extremities X 4 and Patient able to stick tongue midline  Post vital signs: Reviewed  Last Vitals:  Vitals Value Taken Time  BP 135/84   Temp 97.6   Pulse 70   Resp 12   SpO2 95     Last Pain:  Vitals:   08/21/21 1313  TempSrc:   PainSc: 0-No pain         Complications: No notable events documented.

## 2021-08-21 NOTE — Progress Notes (Signed)
Patient left to OR, gave report to short stay.   

## 2021-08-22 ENCOUNTER — Telehealth: Payer: Self-pay | Admitting: *Deleted

## 2021-08-22 ENCOUNTER — Encounter (HOSPITAL_COMMUNITY): Payer: Self-pay | Admitting: Orthopedic Surgery

## 2021-08-22 DIAGNOSIS — L97521 Non-pressure chronic ulcer of other part of left foot limited to breakdown of skin: Secondary | ICD-10-CM

## 2021-08-22 DIAGNOSIS — M869 Osteomyelitis, unspecified: Secondary | ICD-10-CM | POA: Diagnosis not present

## 2021-08-22 DIAGNOSIS — Z89422 Acquired absence of other left toe(s): Secondary | ICD-10-CM

## 2021-08-22 LAB — CBC
HCT: 46.1 % (ref 39.0–52.0)
Hemoglobin: 15.5 g/dL (ref 13.0–17.0)
MCH: 31.4 pg (ref 26.0–34.0)
MCHC: 33.6 g/dL (ref 30.0–36.0)
MCV: 93.5 fL (ref 80.0–100.0)
Platelets: 357 10*3/uL (ref 150–400)
RBC: 4.93 MIL/uL (ref 4.22–5.81)
RDW: 13.6 % (ref 11.5–15.5)
WBC: 10.5 10*3/uL (ref 4.0–10.5)
nRBC: 0 % (ref 0.0–0.2)

## 2021-08-22 LAB — BASIC METABOLIC PANEL
Anion gap: 10 (ref 5–15)
BUN: 20 mg/dL (ref 6–20)
CO2: 21 mmol/L — ABNORMAL LOW (ref 22–32)
Calcium: 8.9 mg/dL (ref 8.9–10.3)
Chloride: 104 mmol/L (ref 98–111)
Creatinine, Ser: 0.76 mg/dL (ref 0.61–1.24)
GFR, Estimated: >60 mL/min (ref >60)
Glucose, Bld: 111 mg/dL — ABNORMAL HIGH (ref 70–99)
Potassium: 4 mmol/L (ref 3.5–5.1)
Sodium: 135 mmol/L (ref 135–145)

## 2021-08-22 LAB — CULTURE, BLOOD (SINGLE)
Culture: NO GROWTH
Culture: NO GROWTH
Special Requests: ADEQUATE
Special Requests: ADEQUATE

## 2021-08-22 LAB — GLUCOSE, CAPILLARY
Glucose-Capillary: 139 mg/dL — ABNORMAL HIGH (ref 70–99)
Glucose-Capillary: 120 mg/dL — ABNORMAL HIGH (ref 70–99)
Glucose-Capillary: 107 mg/dL — ABNORMAL HIGH (ref 70–99)

## 2021-08-22 MED ORDER — OXYCODONE-ACETAMINOPHEN 5-325 MG PO TABS: 1.0000 | ORAL_TABLET | ORAL | 0 refills | Status: AC | PRN

## 2021-08-22 MED ORDER — DOXYCYCLINE HYCLATE 100 MG PO TABS: 100.0000 mg | ORAL_TABLET | Freq: Two times a day (BID) | ORAL | 0 refills | Status: AC

## 2021-08-22 MED ORDER — HYDROMORPHONE HCL 1 MG/ML IJ SOLN
0.5000 mg | Freq: Once | INTRAMUSCULAR | Status: AC
  Administered 2021-08-22: 0.5 mg via INTRAVENOUS
  Filled 2021-08-22: qty 1

## 2021-08-22 MED ORDER — AMOXICILLIN-POT CLAVULANATE 875-125 MG PO TABS: 1.0000 | ORAL_TABLET | Freq: Two times a day (BID) | ORAL | 0 refills | Status: AC

## 2021-08-22 NOTE — Care Management Important Message (Signed)
Important Message  Patient Details  Name: Gregory May MRN: [id number] Date of Birth: 1960/09/17   Medicare Important Message Given:  Yes     Ramonita Koenig Stefan Church 08/22/2021, 3:18 PM
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
## 2021-08-22 NOTE — Telephone Encounter (Signed)
Patient called in stating [facility] does not have a Rx for oxycodone. Receipt confirmed by Pharmacy today at 1:20. Call placed to [facility] at [facility]. States they fixed the problem in their computer and are getting this ready now. Patient is on his way to pharmacy now for this and antibiotics.

## 2021-08-22 NOTE — Progress Notes (Shared)
Subjective: The patient was seen at bedside during rounds this morning.  He reports that he has pain in hand.  Gabapentin has not helped. His questions were answered in regards to surgery. No other complaints or concerns today.  Objective:  Vital signs in last 24 hours: Vitals:   08/21/21 1530 08/21/21 1608 08/21/21 2102 08/22/21 0421  BP: (!) 154/88 133/81 (!) 164/91 (!) 126/55  Pulse: 64 64 93 (!) 49  Resp: 18 18 17 16   Temp: 98.2 F (36.8 C) 97.9 F (36.6 C) 97.9 F (36.6 C) (!) 97.5 F (36.4 C)  TempSrc:  Oral Oral   SpO2: 95% 92% 94% 93%  Weight:      Height:       Physical Exam Constitutional:      General: He is not in acute distress.    Appearance: Normal appearance.  HENT:     Head: Normocephalic and atraumatic.  Eyes:     Extraocular Movements: Extraocular movements intact.     Pupils: Pupils are equal, round, and reactive to light.  Cardiovascular:     Rate and Rhythm: Normal rate and regular rhythm.     Heart sounds: No murmur heard.   No friction rub. No gallop.  Pulmonary:     Effort: Pulmonary effort is normal.     Breath sounds: Normal breath sounds. No wheezing, rhonchi or rales.  Abdominal:     General: Abdomen is flat. There is no distension.  Musculoskeletal:     Comments:  Right index finger is erythematous and edematous. Fluctuance and TTP at DIP joint. No active bleeding noted. Unable to fully flex the DIP joint.  Skin:    General: Skin is warm and dry.  Neurological:     General: No focal deficit present.     Mental Status: He is alert and oriented to person, place, and time.     Afebrile and HDS WBC 10.5 Surgical cultures no organisms grown so far  61 yo who is now s/p right index finger I&D and transphalangeal amputation for osteomyelitis of the distal phalanx. He is currently receiving IV vancomycin and unasyn. Oral abx augmentin and doxy today and DC  Could consider an infectious disease consultation for recommendations on oral  antibiotics.  Patient may perform dressing changes postop day 3 with Baci, Adaptic, 4 x 4's and Coban.  Follow-up in 2 weeks in my office.   Of note: Patient is seen for evaluation for a fungal rash right transtibial amputation.  The ulcers are slowly improving.  Patient has developed the fungal rash from his prosthetic silicone liner.  A Vive wear stump shrinker was applied he will wear this around-the-clock to resolve the fungal rash and will wear a short Vive wear shrinker under the liner.  Patient will need a new liner.  I will follow-up in the office as an outpatient.  Assessment/Plan:  Active Problems:   Cellulitis of finger of right hand   Osteomyelitis of finger of right hand (HCC)  #Cellulitis, osteomyelitis of right index finger Patient with right index finger cellulitis and concern for abscess, also with imaging findings showing osteomyelitis involving tuft distal phalanx right index finger. Pt has been stable on abx regimen. Plan for right index finger I&D, bone biopsy, and possible partial amputation this afternoon. 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 S/p multiple amputations d/t diabetic complications. Most recent A1c of 7.0 on 01/05. CBGs are well-controlled at this time. -Levemir 20 U qhs, SSI with meals -Metformin 1500 mg po daily -Jardiance 25 mg po daily -Actos every other day   #HLD Most recent lipid panel shows total cholesterol of 155, LDL 77, HDL 58. Pt is on pravastatin at home. -Continue pravastatin 80 mg p.o. daily   #HTN Documented h/o HTN, not on any anti-hypertensives at home. Will continue to monitor.  Prior to Admission Living  Arrangement: Home Anticipated Discharge Location: Home Barriers to Discharge: Medical stability, surgery Dispo: Anticipated discharge in approximately 1-2 day(s).   Orvis Brill, MD 08/22/2021, 7:10 AM Pager: [phone]  After 5pm on weekdays and 1pm on weekends: On Call pager [phone]

## 2021-08-22 NOTE — Progress Notes (Signed)
DISCHARGE NOTE HOME Gregory May to be discharged Home per MD order. Discussed prescriptions and follow up appointments with the patient. Prescriptions given to patient; medication list explained in detail. Patient verbalized understanding.  Skin clean, dry and intact without evidence of skin break down, no evidence of skin tears noted. IV catheter discontinued intact. Site without signs and symptoms of complications. Dressing and pressure applied. Pt denies pain at the site currently. No complaints noted.  Patient free of lines, drains, and wounds.   An After Visit Summary (AVS) was printed and given to the patient. Patient escorted via wheelchair, and discharged home via private auto.  Raif Chachere S Emmamarie Kluender, RN

## 2021-08-22 NOTE — Care Management Important Message (Signed)
Important Message  Patient Details  Name: Gregory May MRN: [id number] Date of Birth: February 19, 1961   Medicare Important Message Given:  Yes     Romero Letizia Stefan Church 08/22/2021, 3:23 PM

## 2021-08-22 NOTE — Discharge Instructions (Signed)
°  Orthopaedic Hand Surgery Discharge Instructions ° °WEIGHT BEARING STATUS: Non weight bearing on operative extremity ° °INCISION CARE: Keep dressing over your incision clean and dry until 5 days after surgery. You may shower by placing a waterproof covering over your dressing. Once dressing is removed, you may allow water to run over the incision and then place Band-Aids over incision. Do not scrub your incision or apply creams/lotions. Do not submerge your incision or swim for 3 weeks after surgery. Contact your surgeon or primary care doctor if you develop redness or drainage from your incision.  ° °PAIN CONTROL: First line medications for post operative pain control are Tylenol (acetaminophen) and Motrin (ibuprofen) if you are able to take these medications. If you have been prescribed a medication these can be taken as breakthrough pain medications. Please note that some narcotic pain medication has acetaminophen added and you should never consume more than 4,000mg of acetaminophen in 24-hour period. Please note that if you are given Toradol (ketorolac) you should not take similar medications such as ibuprofen or naproxen. ° °DISCHARGE MEDICATIONS: If you have been prescribed medication it was sent electronically to your pharmacy. No changes have been made to your home medications. ° °ICE/ELEVATION: Ice and elevate your injured extremity as needed. Avoid direct contact of ice with skin.  ° °BANDAGE FEELS TOO TIGHT: If your bandage feels too tight, first make sure you are elevating your fingers as much as possible. The outer layer of the bandage can be unwrapped and reapplied more loosely. If no improvement, you may carefully cut the inner layer longitudinally until the pressure has resolved and then rewrap the outer layer. If you are not comfortable with these instructions, please call the office and the bandage can be changed for you.  ° °FOLLOW UP: You will be called after surgery with an appointment date and  time, however if you have not received a phone call within 3 days, please call during regular office hours at [phone] to schedule a post operative appointment. ° °Please Seek Medical Attention if: °Call MD for: pain or pressure in chest, jaw, arm, back, neck  °Call MD for: temperature greater than 101 F for more than 24 hrs °Call MD for: difficulty breathing °Call MD for: incision redness, bleeding, drainage  °Call MD for: palpitations or feeling that the heart is racing  °Call MD for: increased swelling in arm, leg, ankle, or abdomen  °Call MD for: lightheadedness, dizziness, fainting °Call 911 or go to ER for any medical emergency if you are not able to get in touch with your doctor ° ° °J. Reid Gweneth Fredlund, MD °Orthopaedic Hand Surgeon °[facility] °Office number: [phone] °[address] °[place], [place] [address] ° ° °

## 2021-08-22 NOTE — Progress Notes (Signed)
Received notification from [employer]) regarding RE ENROLLMENT approval for [employer]. Patient assistance approved from 08/22/21 to 08/12/22.  Phone: [phone]

## 2021-08-22 NOTE — Progress Notes (Signed)
Patient ID: Gregory May, male   DOB: 04/07/1961, 61 y.o.   MRN: [id number] Patient is seen for evaluation for a fungal rash right transtibial amputation.  The ulcers are slowly improving.  Patient has developed the fungal rash from his prosthetic silicone liner.  A Vive wear stump shrinker was applied he will wear this around-the-clock to resolve the fungal rash and will wear a short Vive wear shrinker under the liner.  Patient will need a new liner.  I will follow-up in the office as an outpatient.

## 2021-08-23 ENCOUNTER — Telehealth: Payer: Self-pay | Admitting: Internal Medicine

## 2021-08-23 NOTE — Telephone Encounter (Signed)
TOC/HFU  Name: Gregory May, Gregory May MRN: [id number]  Date: 08/29/2021 Status: Sch  Time: 3:45 PM Length: 30  Visit Type: OPEN ESTABLISHED [726] Copay: $0.00  Provider: Iona Beard, MD

## 2021-08-26 LAB — AEROBIC/ANAEROBIC CULTURE W GRAM STAIN (SURGICAL/DEEP WOUND): Culture: NO GROWTH

## 2021-08-29 ENCOUNTER — Other Ambulatory Visit: Payer: Self-pay

## 2021-08-29 ENCOUNTER — Ambulatory Visit (INDEPENDENT_AMBULATORY_CARE_PROVIDER_SITE_OTHER): Payer: Medicare HMO | Admitting: Student

## 2021-08-29 ENCOUNTER — Encounter: Payer: Self-pay | Admitting: Student

## 2021-08-29 ENCOUNTER — Telehealth: Payer: Self-pay | Admitting: Endocrinology

## 2021-08-29 DIAGNOSIS — M869 Osteomyelitis, unspecified: Secondary | ICD-10-CM

## 2021-08-29 DIAGNOSIS — I10 Essential (primary) hypertension: Secondary | ICD-10-CM | POA: Diagnosis not present

## 2021-08-29 DIAGNOSIS — Z89511 Acquired absence of right leg below knee: Secondary | ICD-10-CM | POA: Diagnosis not present

## 2021-08-29 DIAGNOSIS — E11628 Type 2 diabetes mellitus with other skin complications: Secondary | ICD-10-CM

## 2021-08-29 DIAGNOSIS — E1165 Type 2 diabetes mellitus with hyperglycemia: Secondary | ICD-10-CM | POA: Diagnosis not present

## 2021-08-29 DIAGNOSIS — E118 Type 2 diabetes mellitus with unspecified complications: Secondary | ICD-10-CM

## 2021-08-29 LAB — AEROBIC/ANAEROBIC CULTURE W GRAM STAIN (SURGICAL/DEEP WOUND)

## 2021-08-29 MED ORDER — OXYCODONE HCL 5 MG PO TABS: 5.0000 mg | ORAL_TABLET | Freq: Four times a day (QID) | ORAL | 0 refills | Status: AC | PRN

## 2021-08-29 NOTE — Progress Notes (Signed)
° °  CC: Hospital follow-up of osteomyelitis of right index finger status post transphalangeal amputation  HPI:  [name] is a 61 y.o. male with history listed below presents for hospital follow-up.  He was admitted from 08-19-2021 to 1-05/2022 for right index finger osteomyelitis requiring trams phalangeal amputation. Please refer to problem based charting for further details and assessment and plan of current problem and chronic medical conditions.   Past Medical History:  Diagnosis Date   Arthritis    bilateral hips   Cutaneous abscess of left foot    Deep vein thrombosis (DVT) (HCC)    Diabetes mellitus    type II   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 Date of diagnosis February 08 2014 Duration of anticoagulation: 6 months. End date 08/12/2014.  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 Duration of anticoagulation: 8 months. End date 10/11/2014.  Anticoagulant: Lovenox 120 units daily Switched to Eliquis on 05/25/2014 per patient preference    Pulmonary embolism (HCC)    Sebaceous cyst    on back of neck   Review of Systems:  Negative as per HPI  Physical Exam:  Vitals:   08/29/21 1601  BP: 139/86  Pulse: 81  Temp: 98.1 F (36.7 C)  TempSrc: Oral  SpO2: 96%  Weight: 209 lb 6.4 oz (95 kg)  Height: 6' (1.829 m)   Physical Exam Constitutional:      Appearance: Normal appearance.  HENT:     Nose: Nose normal.     Mouth/Throat:     Mouth: Mucous membranes are moist.     Pharynx: Oropharynx is  clear.  Eyes:     Extraocular Movements: Extraocular movements intact.     Pupils: Pupils are equal, round, and reactive to light.  Cardiovascular:     Rate and Rhythm: Normal rate and regular rhythm.  Pulmonary:     Effort: Pulmonary effort is normal.     Breath sounds: Normal breath sounds. No wheezing.  Abdominal:     General: Abdomen is flat. Bowel sounds are normal. There is no distension.     Palpations: Abdomen is soft.     Tenderness: There is no abdominal tenderness.  Musculoskeletal:     Comments: S/p right BKA with erythema rash c/w ringworm  Skin:    Comments: S/p transphalangeal amputation of the right index finger. Dusky appearence of the medal wound edge, no drainage no bleeding, mild erythema above the first [facility] joint, no effusion, no warmth  Neurological:     General: No focal deficit present.     Mental Status: He is alert and oriented to person, place, and time.        Assessment & Plan:   See Encounters Tab for problem based charting.  Patient discussed with Dr.  Lafonda Mosses

## 2021-08-29 NOTE — Telephone Encounter (Signed)
insulin aspart (NOVOLOG) 100 UNIT/ML injection insulin degludec (TRESIBA FLEXTOUCH) 100 UNIT/ML FlexTouch Pen  Patient Assistant is required on the above medication. Patient has provide all the information needed and request for this document be re-faxed to [facility] fax# [phone].

## 2021-08-29 NOTE — Patient Instructions (Signed)
It was a pleasure seeing you in clinic. Today we discussed:   Finger amputation Please follow up with hand surgery on 1/24. I have sent a short course of pain medication until you get to your appointment  If you have any questions or concerns, please call our clinic at [phone] between 9am-5pm and after hours call [phone] and ask for the internal medicine resident on call. If you feel you are having a medical emergency please call 911.   Thank you, we look forward to helping you remain healthy!

## 2021-08-30 NOTE — Progress Notes (Signed)
Internal Medicine Clinic Attending  I saw and evaluated the patient.  I personally confirmed the key portions of the history and exam documented by Dr. Elaina Pattee and I reviewed pertinent patient test results.  The assessment, diagnosis, and plan were formulated together and I agree with the documentation in the residents note.   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 Surgeons took 1 culture from the distal part that was amputated, and 1 culture from the remaining margin. Unfortunately, these 2 samples are not labelled in our system. 1 is growing staph, and 1 is just WBCs. I think the culture growing Staph is probably from the distal/amputated part, and margins are probably clean, but I'm not 100% sure. Clinically, he is doing well, not infected. We advised patient to continue PO antibiotics for now to complete 2 week course, and follow up with hand surgeon, as scheduled. I don't think 6 week course of Abx is indicated. If patient develops fevers or systemic signs of illness, I would suggest repeat culture of remaining finger to see if he still has osteo.

## 2021-08-30 NOTE — Assessment & Plan Note (Signed)
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  Follows with Dr. Lajoyce Corners.  Was noted to have a fungal rash during hospitalization on his right stump particularly in the posterior thigh area.  On exam today rash consistent with ringworm seems less erythematous some excoriations around these areas.  Patient states that he was initially told to start topical antifungal cream but Dr. Lajoyce Corners had recommended wearing a new stump shrinker and getting a new liner and outpatient follow-up.  Patient reports he was told not to place any cream on the area.  Follow-up with Dr. Lajoyce Corners to on 1/19 Patient instructed to call if infection worsened

## 2021-08-30 NOTE — Assessment & Plan Note (Signed)
Follows with Dr. Lucianne Muss with endocrinology.A1c of 7 during hospitalization last week.  This is improved from 7.5 in October 2022.  Diabetes regimen includes Tresiba 10 units daily, NovoLog 16 to 20 units sliding scale prior to meals, metformin 1500 mg daily, pioglitazone 30 mg every other day, and Jardiance 25 mg daily.  States his [name] is expensive and needs a new PA for this.  Concerned that he will run out of his [name] and so has been only using 60 units daily.  Reports morning blood glucoses between 120 and 180s.  Denies any episodes of hypoglycemia or symptoms of dizziness, shakiness, confusion.  Continue current medications Would recommend he continue with Tresiba 10 units daily Follow-up with endocrinology, will work with endocrinology to get PA for [place]

## 2021-08-30 NOTE — Assessment & Plan Note (Signed)
Admitted from 1/7-1/10 for osteomyelitis of the right index finger.He developed ulceration after burning his finger on a pizza stone in the setting of poorly controlled diabetes. X-ray with osteomyelitis of the distal tuft of the right index finger. Started on IV Vanco and Unasy then narrowed to Augmentin and Doxy at discharge for planned course of 2 weeks of antibiotics if surgical cultures of proximal phalanx negative. Underwent transphalgeal amputation on 1/9. Surgical cultures positive for staph hominis unfortunately unable to differentiate proximal versus distal culture in the system.  Called microbiology lab but they were unable  to clarify this.    On exam today he is hemodynamically stable does not appear that he has signs of infection in his right index finger.  There is duskiness especially on the medial surgical wound area with diminished sensation concerning for poor wound healing.  If he remains without signs of infection would treat with antibiotics to complete 2 weeks with end date of 1/24.  If he is to develop worsening signs of infection including worsening pain, erythema, warmth, purulence would recommend completing 6 weeks of antibiotics for possible osteomyelitis gien lack of clarity with the cultures. He has follow-up with hand surgery on 1/24.  Plan Continue Augmentin and doxycycline Follow-up with hand surgery on 1/24 Continue dressing changes every 4 days with gauze and [place] Patient given return precautions for worsening signs of infection

## 2021-08-30 NOTE — Assessment & Plan Note (Signed)
BP well controlled currently on empagliflozin

## 2021-08-31 ENCOUNTER — Ambulatory Visit (INDEPENDENT_AMBULATORY_CARE_PROVIDER_SITE_OTHER): Payer: Medicare HMO | Admitting: Orthopedic Surgery

## 2021-08-31 ENCOUNTER — Encounter: Payer: Self-pay | Admitting: Orthopedic Surgery

## 2021-08-31 ENCOUNTER — Other Ambulatory Visit (INDEPENDENT_AMBULATORY_CARE_PROVIDER_SITE_OTHER): Payer: Medicare HMO

## 2021-08-31 ENCOUNTER — Other Ambulatory Visit: Payer: Self-pay

## 2021-08-31 DIAGNOSIS — Z89511 Acquired absence of right leg below knee: Secondary | ICD-10-CM | POA: Diagnosis not present

## 2021-08-31 DIAGNOSIS — L97521 Non-pressure chronic ulcer of other part of left foot limited to breakdown of skin: Secondary | ICD-10-CM

## 2021-08-31 DIAGNOSIS — E1165 Type 2 diabetes mellitus with hyperglycemia: Secondary | ICD-10-CM

## 2021-08-31 DIAGNOSIS — Z794 Long term (current) use of insulin: Secondary | ICD-10-CM

## 2021-08-31 DIAGNOSIS — S88111A Complete traumatic amputation at level between knee and ankle, right lower leg, initial encounter: Secondary | ICD-10-CM

## 2021-08-31 LAB — COMPREHENSIVE METABOLIC PANEL
ALT: 49 U/L (ref 0–53)
AST: 45 U/L — ABNORMAL HIGH (ref 0–37)
Albumin: 4.1 g/dL (ref 3.5–5.2)
Alkaline Phosphatase: 58 U/L (ref 39–117)
BUN: 13 mg/dL (ref 6–23)
CO2: 28 mEq/L (ref 19–32)
Calcium: 9.4 mg/dL (ref 8.4–10.5)
Chloride: 102 mEq/L (ref 96–112)
Creatinine, Ser: 1.04 mg/dL (ref 0.40–1.50)
GFR: 78.93 mL/min (ref >60.00)
Glucose, Bld: 69 mg/dL — ABNORMAL LOW (ref 70–99)
Potassium: 4.4 mEq/L (ref 3.5–5.1)
Sodium: 139 mEq/L (ref 135–145)
Total Bilirubin: 0.3 mg/dL (ref 0.2–1.2)
Total Protein: 7.1 g/dL (ref 6.0–8.3)

## 2021-08-31 LAB — HEMOGLOBIN A1C: Hgb A1c MFr Bld: 7.5 % — ABNORMAL HIGH (ref 4.6–6.5)

## 2021-08-31 NOTE — Progress Notes (Signed)
Office Visit Note   Patient: Gregory May           Date of Birth: 12-22-1960           MRN: [id number] Visit Date: 08/31/2021              Requested by: Timothy Lasso, MD [address] [place],  [place] [address] PCP: Timothy Lasso, MD  Chief Complaint  Patient presents with   Left Foot - Follow-up   Right Leg - Follow-up      HPI: Patient is a 61 year old gentleman who had a fungal rash on his right transtibial amputation.  Patient was placed in a Vive where prosthetic under liner 10 days ago.  Assessment & Plan: Visit Diagnoses:  1. Below-knee amputation of right lower extremity ([place])   2. Ulcer of toe of left foot, limited to breakdown of skin ([place])     Plan: The fungal rashes have completely healed in 10 days.  Patient will continue to use the prosthetic under liner.  Follow-Up Instructions: Return if symptoms worsen or fail to improve.   Ortho Exam  Patient is alert, oriented, no adenopathy, well-dressed, normal affect, normal respiratory effort. Examination the fungal rashes circumferentially around the residual limb have completely healed.  There is no cellulitis there is good epithelization.  Imaging: No results found.     Labs: Lab Results  Component Value Date   HGBA1C 7.0 (A) 08/17/2021   HGBA1C 7.5 (H) 05/23/2021   HGBA1C 7.3 (H) 02/20/2021   ESRSEDRATE 25 (H) 08/19/2021   ESRSEDRATE 8 07/15/2019   ESRSEDRATE 56 (H) 06/01/2015   CRP 1.7 (H) 08/19/2021   CRP 48 (H) 07/15/2019   REPTSTATUS 08/26/2021 FINAL 08/21/2021   GRAMSTAIN  08/21/2021    WBC PRESENT,BOTH PMN AND MONONUCLEAR NO ORGANISMS SEEN    CULT  08/21/2021    No growth aerobically or anaerobically. Performed at [facility], [place] [address], [place], [place] [address]    LABORGA STAPHYLOCOCCUS HOMINIS 08/21/2021     Lab Results  Component Value Date   ALBUMIN 3.2 (L) 08/19/2021   ALBUMIN 4.5 05/23/2021   ALBUMIN 4.1 02/20/2021    No results found  for: MG Lab Results  Component Value Date   VD25OH 16 (L) 11/05/2013    No results found for: PREALBUMIN CBC EXTENDED Latest Ref Rng & Units 08/22/2021 08/21/2021 08/20/2021  WBC 4.0 - 10.5 K/uL 10.5 7.3 8.1  RBC 4.22 - 5.81 MIL/uL 4.93 5.21 4.82  HGB 13.0 - 17.0 g/dL 15.5 16.4 15.2  HCT 39.0 - 52.0 % 46.1 48.4 45.3  PLT 150 - 400 K/uL 357 351 323  NEUTROABS 1.7 - 7.7 K/uL - - -  LYMPHSABS 0.7 - 4.0 K/uL - - -     There is no height or weight on file to calculate BMI.  Orders:  No orders of the defined types were placed in this encounter.  No orders of the defined types were placed in this encounter.    Procedures: No procedures performed  Clinical Data: No additional findings.  ROS:  All other systems negative, except as noted in the HPI. Review of Systems  Objective: Vital Signs: There were no vitals taken for this visit.  Specialty Comments:  No specialty comments available.  PMFS History: Patient Active Problem List   Diagnosis Date Noted   Osteomyelitis of finger of right hand ([place]) 08/19/2021   Cellulitis of finger of right hand 08/17/2021   Osteomyelitis of second toe  of left foot ([place])    Hepatic steatosis 06/24/2020   Back pain 04/20/2020   Flu vaccine need 04/20/2020   COVID-19 virus vaccination declined 04/20/2020   Left knee pain 04/20/2020   Allergic sinusitis 04/20/2020   Elevated liver enzymes 04/20/2020   Diastasis of rectus abdominis 07/16/2019   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 10/07/2018   Essential hypertension 06/19/2018   Peripheral neuropathy 12/19/2017   Acquired absence of right leg below knee ([place]) 04/17/2017   Carpal tunnel syndrome 03/18/2017   Colon cancer screening 06/21/2015   Status post below knee amputation of right lower extremity ([place]) 06/07/2015   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 ([place]) 06/01/2015   Diabetic polyneuropathy associated with type 2 diabetes mellitus  ([place]) 12/09/2014   Diabetes mellitus with carpal tunnel syndrome ([place]) 12/09/2014   DJD (degenerative joint disease) of knee 07/21/2014   Osteoarthritis, hip, 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 Date of diagnosis February 08 2014 Duration of anticoagulation: 6 months. End date 08/12/2014.  Anticoagulant: Lovenox 120 units daily Switched to Eliquis on 05/25/2014      Ear drum perforation, right 03/06/2019   Non-pressure chronic ulcer of right calf, limited to breakdown of skin ([place]) 04/17/2017   Pulmonary emboli ([place]) 02/08/2014   Date of diagnosis February 08 2014, on chest CTA Hospitalized for 3 days Had some symptoms of shortness of breath, and chest pain With intercurrent DVT of the left LE. Duration of anticoagulation: 8 months. End date 10/11/2014.  Anticoagulant: Lovenox 120 units daily Switched to Eliquis on 05/25/2014 per patient preference    Pulmonary embolism ([place])    Sebaceous cyst    on back of neck    Family History  Problem Relation Age of Onset   Breast cancer Mother    Cancer Mother        small intestine   Liver cancer Mother    Diabetes Mother    Diabetes Father    Diabetes Brother    Hypertension Maternal Grandmother    Heart Problems Maternal Grandmother    Diabetes Paternal Grandmother    Diabetes Paternal Grandfather    Diabetes Brother     Past Surgical History:  Procedure Laterality Date   AMPUTATION Right 06/03/2015   Procedure: Right Below Knee Amputation;  Surgeon: [name], MD;  Location: [facility];  Service: Orthopedics;  Laterality: Right;   AMPUTATION Left 07/24/2019   Procedure: LEFT FOOT 3RD RAY AMPUTATION;  Surgeon: [name], MD;  Location: [facility];  Service: Orthopedics;  Laterality: Left;   AMPUTATION Left 06/23/2021   Procedure: LEFT 2ND TOE AMPUTATION;  Surgeon: [name], MD;  Location: [facility];  Service: Orthopedics;  Laterality: Left;   AMPUTATION Right 08/21/2021   Procedure: AMPUTATION RIGHT INDEX FINGER;  Surgeon: Orene Desanctis, MD;  Location: [facility];  Service: Orthopedics;  Laterality: Right;   CLOSED REDUCTION WITH HUMER PIN INSERTION  1974   left hip   HARDWARE REMOVAL Left 07/21/2014   Procedure: HARDWARE REMOVAL;  Surgeon: Ninetta Lights, MD;  Location: [facility];  Service: Orthopedics;  Laterality: Left;   I & D EXTREMITY Right 08/21/2021   Procedure: IRRIGATION AND DEBRIDEMENT RIGHT INDEX FINGER;  Surgeon: Orene Desanctis, MD;  Location: [facility];  Service: Orthopedics;  Laterality: Right;   ROTATOR CUFF REPAIR Right 2005 (approx)   TOTAL HIP ARTHROPLASTY Left 07/21/2014   Procedure: TOTAL HIP ARTHROPLASTY ANTERIOR APPROACH;  Surgeon: Ninetta Lights, MD;  Location: [facility];  Service: Orthopedics;  Laterality: Left;   TOTAL HIP ARTHROPLASTY Right 2006 (approx)   right hip replaced   Social History   Occupational History   Occupation: disabled  Tobacco Use   Smoking status: Never   Smokeless tobacco: Never  Vaping Use   Vaping Use: Never used  Substance and Sexual Activity   Alcohol use: Not Currently    Comment: beer and mixed drink maybe 5  times a month   Drug use: No   Sexual activity: Never

## 2021-08-31 NOTE — Telephone Encounter (Signed)
Paperwork was originally faxed to [employer] on 12/21 and confirmation received of 11 pages. I refaxed application to the same number [phone] again today.

## 2021-09-01 ENCOUNTER — Telehealth: Payer: Self-pay | Admitting: Endocrinology

## 2021-09-01 NOTE — Telephone Encounter (Signed)
Patient called to advise that [employer] contacted him to advise that the RX sent to them did not have instructions or the instructions were not legible.  Patient was advised to call providers office to be on look out for fax from patient assistance.

## 2021-09-04 ENCOUNTER — Other Ambulatory Visit: Payer: Medicare HMO

## 2021-09-05 DIAGNOSIS — M79641 Pain in right hand: Secondary | ICD-10-CM | POA: Diagnosis not present

## 2021-09-05 DIAGNOSIS — G8918 Other acute postprocedural pain: Secondary | ICD-10-CM | POA: Insufficient documentation

## 2021-09-05 DIAGNOSIS — S68119A Complete traumatic metacarpophalangeal amputation of unspecified finger, initial encounter: Secondary | ICD-10-CM | POA: Insufficient documentation

## 2021-09-05 NOTE — Telephone Encounter (Signed)
Paperwork was refaxed on 1/19 to [employer]

## 2021-09-07 ENCOUNTER — Other Ambulatory Visit: Payer: Self-pay

## 2021-09-07 ENCOUNTER — Ambulatory Visit (INDEPENDENT_AMBULATORY_CARE_PROVIDER_SITE_OTHER): Payer: Medicare HMO | Admitting: Endocrinology

## 2021-09-07 ENCOUNTER — Encounter: Payer: Self-pay | Admitting: Endocrinology

## 2021-09-07 VITALS — BP 132/82 | HR 94 | Ht 72.0 in | Wt 203.4 lb

## 2021-09-07 DIAGNOSIS — Z794 Long term (current) use of insulin: Secondary | ICD-10-CM | POA: Diagnosis not present

## 2021-09-07 DIAGNOSIS — E1165 Type 2 diabetes mellitus with hyperglycemia: Secondary | ICD-10-CM

## 2021-09-07 DIAGNOSIS — E1142 Type 2 diabetes mellitus with diabetic polyneuropathy: Secondary | ICD-10-CM

## 2021-09-07 DIAGNOSIS — R7989 Other specified abnormal findings of blood chemistry: Secondary | ICD-10-CM

## 2021-09-07 DIAGNOSIS — E782 Mixed hyperlipidemia: Secondary | ICD-10-CM | POA: Diagnosis not present

## 2021-09-07 NOTE — Progress Notes (Signed)
Gregory May 61 y.o.           Reason for Appointment: follow-up   History of Present Illness   Diagnosis: Type 2 DIABETES MELITUS, date of diagnosis:  1999     Previous history: He has previously been treated with metformin and Victoza and subsequently mealtime insulin added to control postprandial hyperglycemia Overall he has been  relatively noncompliant with his diet, medications, monitoring and followup Previously would not take Victoza regularly because of cost. Has not been taking any medications for the last year and a half because of commitments to family and cost A1c had increased to 12.3% and hyperglycemia discovered when he was hospitalized for foot ulcer. Also lost 20 pounds because of hyperglycemia   For better control, compliance and inconvenience he was switched to the V-go pump on 02/21/16  Recent history:   Insulin regimen:   CURRENTLY: TRESIBA 6 units daily and NovoLog 16 -20 units before meals  Non-insulin hypoglycemic drugs: Metformin ER 1500 mg daily , Actos 30 mg every other day, Jardiance 25mg  daily    A1c  is about the same at 7.5  Current diabetes management, blood sugar patterns and problems identified.  He did not call the DME supplier to get his freestyle libre set up and currently blood sugar monitoring is somewhat erratic Not clear what is blood sugar patterns are as he is checking blood sugars at random times Also not able to download his meter today  His highest reading was 200 after eating a second serving of pizza without taking any NovoLog coverage However his blood sugars rarely can be as low as 65 if he is eating less in the evening Usually not checking blood sugars after his first meal, generally still eating 2 meals a day Actos was reduced to every other day because of edema Recently because of his not being approved for his assistance program he has reduced his down to 6 units instead of 10 However he still is getting  fairly good fasting readings most of the time, as low as 90 today  Has breakfast around 10-11 AM and dinner at 8 PM   Side effects from medications: None       Glucometer:  Accu-Chek guide          Blood Glucose readings by    PRE-MEAL Fasting Lunch Dinner Bedtime Overall  Glucose range: 90-203 139, 146 94    Mean/median:     ?   POST-MEAL PC Breakfast PC Lunch PC Dinner  Glucose range:   65-200  Mean/median:      Previously:   PRE-MEAL Fasting Lunch Dinner Bedtime Overall  Glucose range: 94-196      Mean/median: 139  145 133 139   POST-MEAL PC Breakfast PC Lunch PC Dinner  Glucose range:     Mean/median:       PRE-MEAL Fasting Lunch Dinner Bedtime Overall  Glucose range: 95-146    83-186  Mean/median: 125  128 134 128     Meals:  usually 2 meals per day usually.  breakfast at 12 noon, evening meal 7-9 pm, variable snacks late at night    Physical activity: exercise: Minimal               Weight control:   Wt Readings from Last 3 Encounters:  09/07/21 203 lb 6.4 oz (92.3 kg)  08/29/21 209 lb 6.4 oz (95 kg)  08/21/21 209 lb (94.8 kg)  Diabetes labs:  Lab Results  Component Value Date   HGBA1C 7.5 (H) 08/31/2021   HGBA1C 7.0 (A) 08/17/2021   HGBA1C 7.5 (H) 05/23/2021   Lab Results  Component Value Date   MICROALBUR 4.2 (H) 02/23/2021   LDLCALC 77 05/23/2021   CREATININE 1.04 08/31/2021    Lab Results  Component Value Date   FRUCTOSAMINE 245 09/19/2020   FRUCTOSAMINE 272 03/03/2019   FRUCTOSAMINE 243 03/07/2017     Other active problems: See review of systems    No visits with results within 1 Week(s) from this visit.  Latest known visit with results is:  Lab on 08/31/2021  Component Date Value Ref Range Status   Sodium 08/31/2021 139  135 - 145 mEq/L Final   Potassium 08/31/2021 4.4  3.5 - 5.1 mEq/L Final   Chloride 08/31/2021 102  96 - 112 mEq/L Final   CO2 08/31/2021 28  19 - 32 mEq/L Final   Glucose, Bld 08/31/2021 69 (L)  70  - 99 mg/dL Final   BUN 16/10/960401/19/2023 13  6 - 23 mg/dL Final   Creatinine, Ser 08/31/2021 1.04  0.40 - 1.50 mg/dL Final   Total Bilirubin 08/31/2021 0.3  0.2 - 1.2 mg/dL Final   Alkaline Phosphatase 08/31/2021 58  39 - 117 U/L Final   AST 08/31/2021 45 (H)  0 - 37 U/L Final   ALT 08/31/2021 49  0 - 53 U/L Final   Total Protein 08/31/2021 7.1  6.0 - 8.3 g/dL Final   Albumin 54/09/811901/19/2023 4.1  3.5 - 5.2 g/dL Final   GFR 14/78/295601/19/2023 78.93  >60.00 mL/min Final   Calculated using the CKD-EPI Creatinine Equation (2021)   Calcium 08/31/2021 9.4  8.4 - 10.5 mg/dL Final   Hgb O1HA1c MFr Bld 08/31/2021 7.5 (H)  4.6 - 6.5 % Final   Glycemic Control Guidelines for People with Diabetes:Non Diabetic:  <6%Goal of Therapy: <7%Additional Action Suggested:  >8%      Allergies as of 09/07/2021   No Known Allergies      Medication List        Accurate as of September 07, 2021  3:21 PM. If you have any questions, ask your nurse or doctor.          Accu-Chek FastClix Lancets Misc Use Accu Chek Fastclix lancets to check blood sugar three times daily. DX:E11.65   Accu-Chek Guide test strip Generic drug: glucose blood TEST BLOOD SUGAR THREE TIMES DAILY AS DIRECTED   acetaminophen 500 MG tablet Commonly known as: TYLENOL Take 500-1,000 mg by mouth every 6 (six) hours as needed for headache (pain).   aspirin EC 81 MG tablet Take 81 mg by mouth every morning. Swallow whole.   diclofenac Sodium 1 % Gel Commonly known as: Voltaren Apply 2 g topically 4 (four) times daily. What changed:  when to take this reasons to take this   diphenhydrAMINE 25 MG tablet Commonly known as: BENADRYL Take 25 mg by mouth at bedtime as needed (sinus drainage).   empagliflozin 25 MG Tabs tablet Commonly known as: Jardiance Take 1 tablet (25 mg total) by mouth daily.   fluticasone 50 MCG/ACT nasal spray Commonly known as: FLONASE Place 2 sprays into both nostrils daily as needed for allergies. What changed:  when to  take this reasons to take this   gabapentin 300 MG capsule Commonly known as: NEURONTIN TAKE 1 CAPSULE FOUR TIMES DAILY AS NEEDED What changed: See the new instructions.   insulin aspart 100 UNIT/ML injection Commonly known  as: novoLOG Inject 15-20 Units into the skin See admin instructions. Inject 15 units subcutaneously daily after breakfast, inject 16-20 units after supper - evening dose is based on size of meal and carb count   Insulin Syringes (Disposable) U-100 0.5 ML Misc Use to inject insulin   metFORMIN 750 MG 24 hr tablet Commonly known as: GLUCOPHAGE-XR TAKE 2 TABLETS EVERY DAY What changed:  how to take this when to take this   multivitamin with minerals Tabs tablet Take 1 tablet by mouth every morning. Centrum   oxyCODONE-acetaminophen 5-325 MG tablet Commonly known as: PERCOCET/ROXICET Take 1 tablet by mouth 4 (four) times daily as needed.   pioglitazone 30 MG tablet Commonly known as: Actos Take 1 tablet (30 mg total) by mouth daily. What changed: when to take this   pravastatin 80 MG tablet Commonly known as: PRAVACHOL TAKE 1 TABLET EVERY DAY What changed: when to take this   [place] FlexTouch 100 UNIT/ML FlexTouch Pen Generic drug: insulin degludec Inject 10-12 Units into the skin at bedtime.        Allergies: No Known Allergies  Past Medical History:  Diagnosis Date   Arthritis    bilateral hips   Cutaneous abscess of left foot    Deep vein thrombosis (DVT) (HCC)    Diabetes mellitus    type II   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 Date of diagnosis February 08 2014 Duration of anticoagulation: 6 months. End date 08/12/2014.  Anticoagulant: Lovenox 120 units daily Switched to Eliquis on 05/25/2014      Ear drum perforation, right 03/06/2019   Non-pressure chronic ulcer of right calf, limited to breakdown of skin (HCC) 04/17/2017    Pulmonary emboli (HCC) 02/08/2014   Date of diagnosis February 08 2014, on chest CTA Hospitalized for 3 days Had some symptoms of shortness of breath, and chest pain With intercurrent DVT of the left LE. Duration of anticoagulation: 8 months. End date 10/11/2014.  Anticoagulant: Lovenox 120 units daily Switched to Eliquis on 05/25/2014 per patient preference    Pulmonary embolism (HCC)    Sebaceous cyst    on back of neck    Past Surgical History:  Procedure Laterality Date   AMPUTATION Right 06/03/2015   Procedure: Right Below Knee Amputation;  Surgeon: Nadara Mustard, MD;  Location: [facility] OR;  Service: Orthopedics;  Laterality: Right;   AMPUTATION Left 07/24/2019   Procedure: LEFT FOOT 3RD RAY AMPUTATION;  Surgeon: Nadara Mustard, MD;  Location: [facility] OR;  Service: Orthopedics;  Laterality: Left;   AMPUTATION Left 06/23/2021   Procedure: LEFT 2ND TOE AMPUTATION;  Surgeon: Nadara Mustard, MD;  Location: [facility] OR;  Service: Orthopedics;  Laterality: Left;   AMPUTATION Right 08/21/2021   Procedure: AMPUTATION RIGHT INDEX FINGER;  Surgeon: Gomez Cleverly, MD;  Location: [facility] OR;  Service: Orthopedics;  Laterality: Right;   CLOSED REDUCTION WITH HUMER PIN INSERTION  1974   left hip   HARDWARE REMOVAL Left 07/21/2014   Procedure: HARDWARE REMOVAL;  Surgeon: Loreta Ave, MD;  Location: [facility] OR;  Service: Orthopedics;  Laterality: Left;   I & D EXTREMITY Right 08/21/2021   Procedure: IRRIGATION AND DEBRIDEMENT RIGHT INDEX FINGER;  Surgeon: Gomez Cleverly, MD;  Location: [facility] OR;  Service: Orthopedics;  Laterality: Right;   ROTATOR CUFF REPAIR Right 2005 (approx)   TOTAL HIP ARTHROPLASTY Left 07/21/2014  Procedure: TOTAL HIP ARTHROPLASTY ANTERIOR APPROACH;  Surgeon: Loreta Aveaniel F Murphy, MD;  [facility];  Service: Orthopedics;  Laterality: Left;   TOTAL HIP ARTHROPLASTY Right 2006 (approx)   right hip replaced    Family History  Problem Relation Age of Onset   Breast cancer Mother    Cancer Mother        small  intestine   Liver cancer Mother    Diabetes Mother    Diabetes Father    Diabetes Brother    Hypertension Maternal Grandmother    Heart Problems Maternal Grandmother    Diabetes Paternal Grandmother    Diabetes Paternal Grandfather    Diabetes Brother     Social History:  reports that he has never smoked. He has never used smokeless tobacco. He reports that he does not currently use alcohol. He reports that he does not use drugs.  Review of Systems:   Lipids: He is on Pravachol 80 mg  LDL is consistently below 130100, normal triglycerides   Lab Results  Component Value Date   CHOL 155 05/23/2021   CHOL 144 11/21/2020   CHOL 151 06/21/2020   Lab Results  Component Value Date   HDL 58.40 05/23/2021   HDL 58.90 11/21/2020   HDL 43.10 06/21/2020   Lab Results  Component Value Date   LDLCALC 77 05/23/2021   LDLCALC 65 11/21/2020   LDLCALC 78 06/21/2020   Lab Results  Component Value Date   TRIG 98.0 05/23/2021   TRIG 102.0 11/21/2020   TRIG 147.0 06/21/2020   Lab Results  Component Value Date   CHOLHDL 3 05/23/2021   CHOLHDL 2 11/21/2020   CHOLHDL 3 06/21/2020   Lab Results  Component Value Date   LDLDIRECT 91.0 07/11/2015    Has history of abnormal liver functions chronically Previously had not improved with stopping pravastatin  With starting Actos in 06/2020 his liver functions have improved AST continues to be mildly increased  He has a fatty liver which was seen on his ultrasound  Lab Results  Component Value Date   ALT 49 08/31/2021   ALT 45 (H) 08/19/2021   ALT 40 05/23/2021   ALT 37 02/20/2021   ALT 49 11/21/2020     BLOOD pressure: not above normal usually Controlled with Jardiance   BP Readings from Last 3 Encounters:  09/07/21 132/82  08/29/21 139/86  08/22/21 (!) 142/75   Renal function normal Microalbumin normal as of 02/2021  Lab Results  Component Value Date   CREATININE 1.04 08/31/2021   CREATININE 0.76 08/22/2021    CREATININE 0.83 08/20/2021   NEUROPATHY:  He has burning in the left foot along with discomfort, paresthesia, some numbness  Also has paresthesias in his hand He takes 2 capsules a gabapentin at breakfast, 1 at dinnertime and sometimes 1 at bedtime  Recently had amputation of his right finger because of infection following a burn     Examination:   BP 132/82    Pulse 94    Ht 6' (1.829 m)    Wt 203 lb 6.4 oz (92.3 kg)    SpO2 99%    BMI 27.59 kg/m   Body mass index is 27.59 kg/m.   No left ankle edema present  ASSESSMENT/ PLAN:     Diabetes type 2 insulin-dependent:  See history of present illness for detailed discussion of current diabetes management, blood sugar patterns and problems identified  A1c is the same at 7.5  He is on [place] and NovoLog along  with Jardiance 25 mg, 15 mg Actos and Metformin  Is requiring minimal doses of basal insulin and now only taking 6 units of Tresiba Likely can do better with continuous glucose monitoring but he still has not applied to get the freestyle libre sensor as discussed previously Only at times when he is off his diet he will have high readings after meals but monitoring is somewhat irregular Needs to continue adjusting his NovoLog based on his meal size  Left ankle edema better with taking Actos every other day  Reminded him to call the DME supplier again for the libre  Abnormal liver functions: Liver functions are improved with Actos although not consistently normal    Follow-up in 3 months   There are no Patient Instructions on file for this visit.    Reather Littler 09/07/2021, 3:21 PM   Note: This office note was prepared with Dragon voice recognition system technology. Any transcriptional errors that result from this process are unintentional.

## 2021-09-07 NOTE — Patient Instructions (Addendum)
Call DME supplier for [facility]  [name] and keep am sugar 80-120

## 2021-09-08 IMAGING — CR DG FOOT COMPLETE 3+V*L*
3 series · 3 of 3 positions shown · non-contrast
Comparison: None.

CLINICAL DATA: Left foot ulcer.

EXAM:
LEFT FOOT - COMPLETE 3+ VIEW

[foot ap]
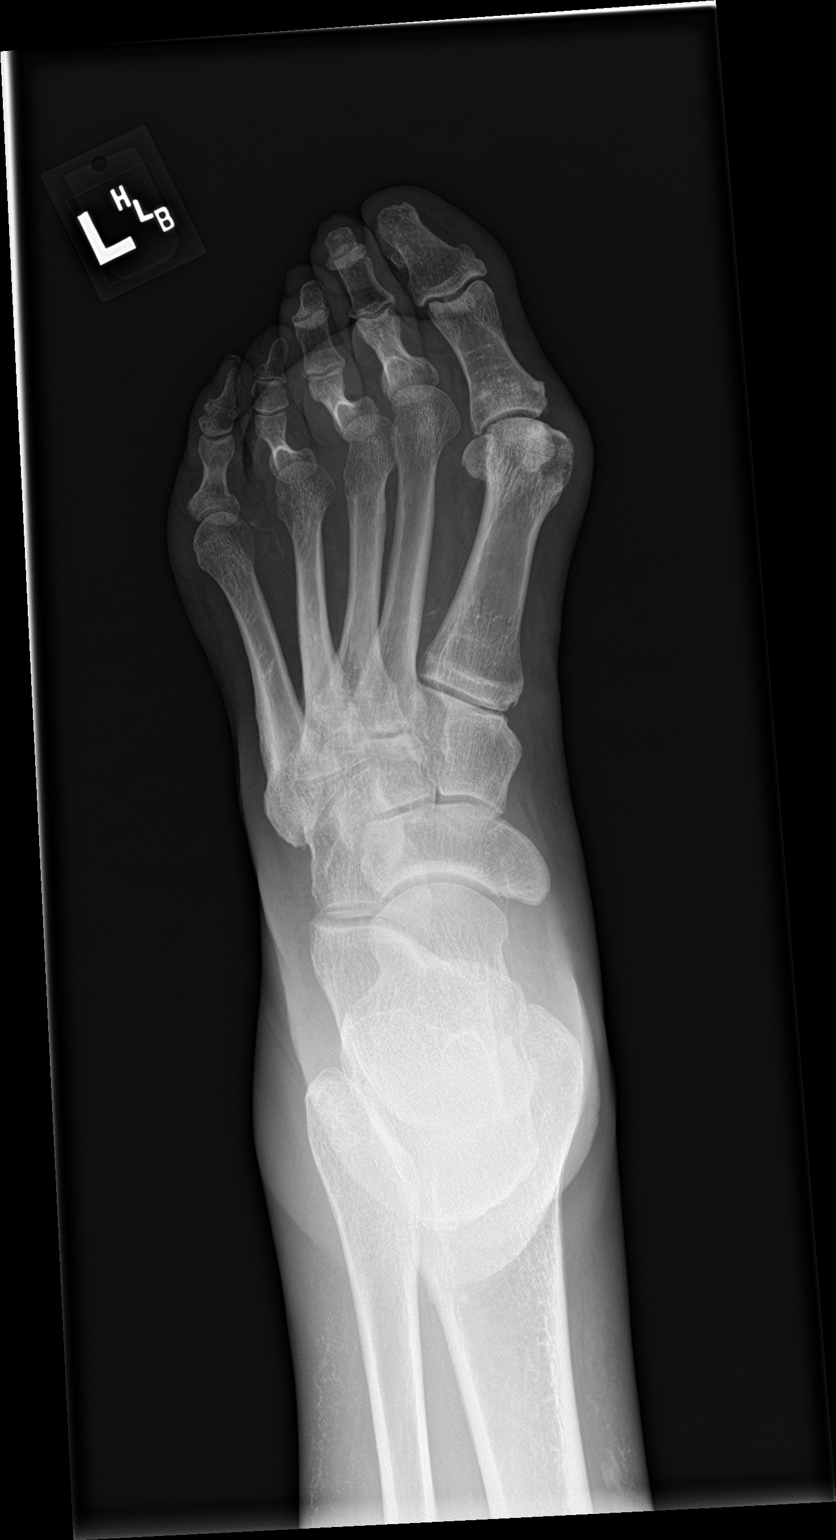

[foot obl]
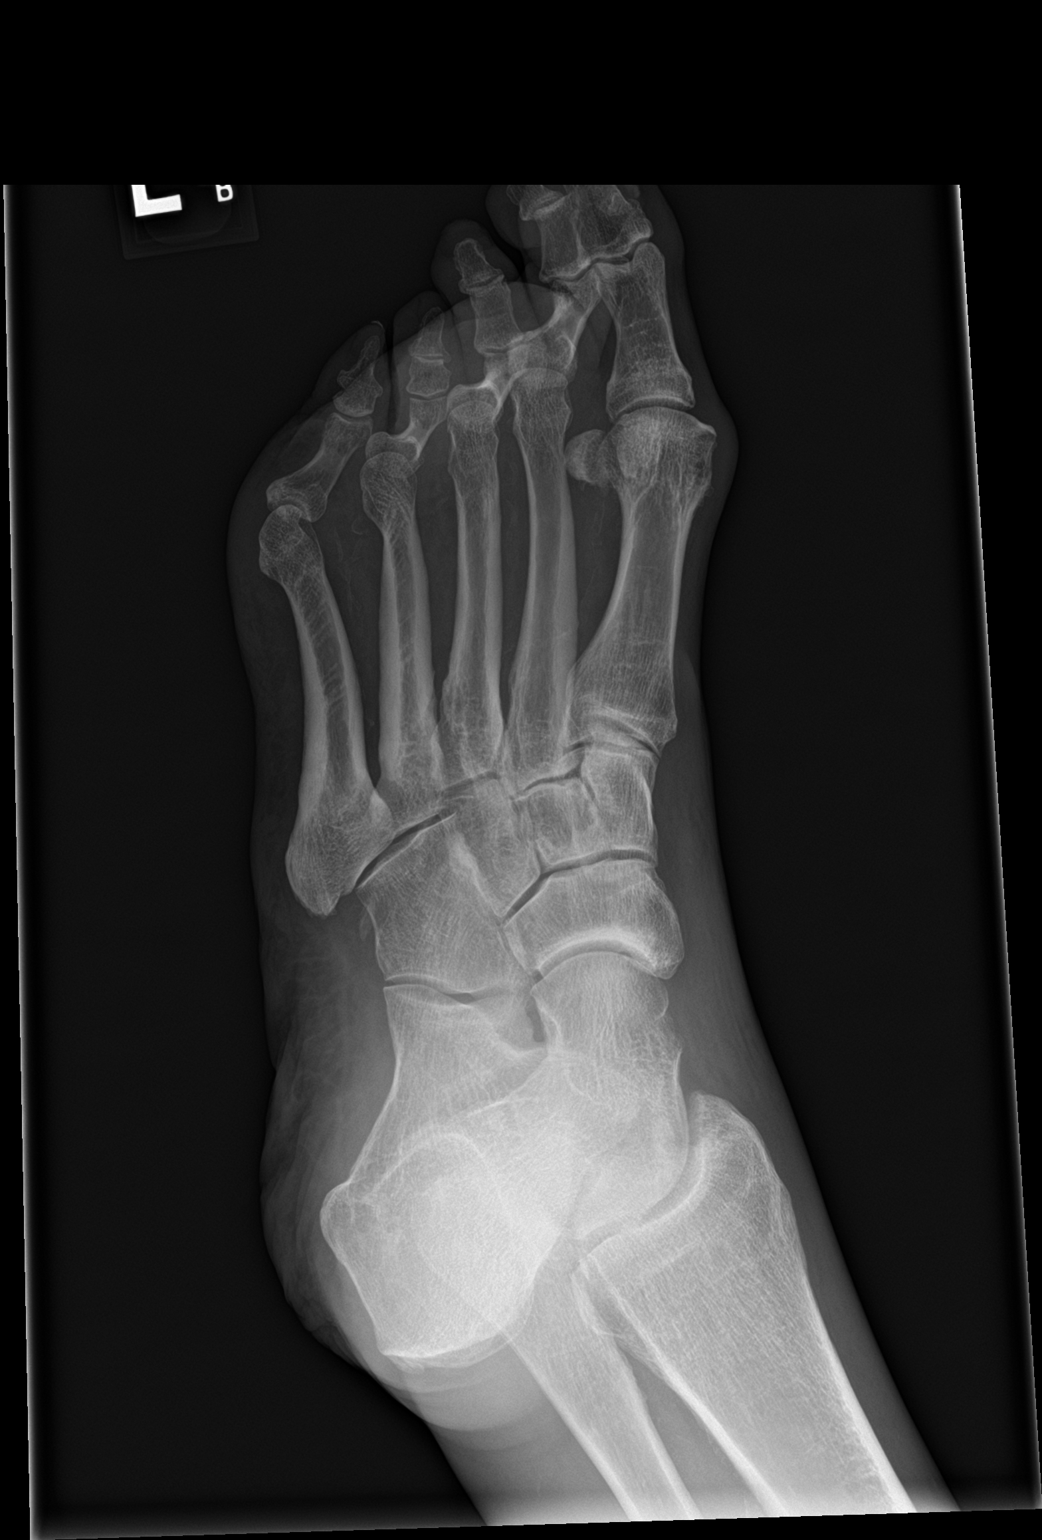

[foot lat]
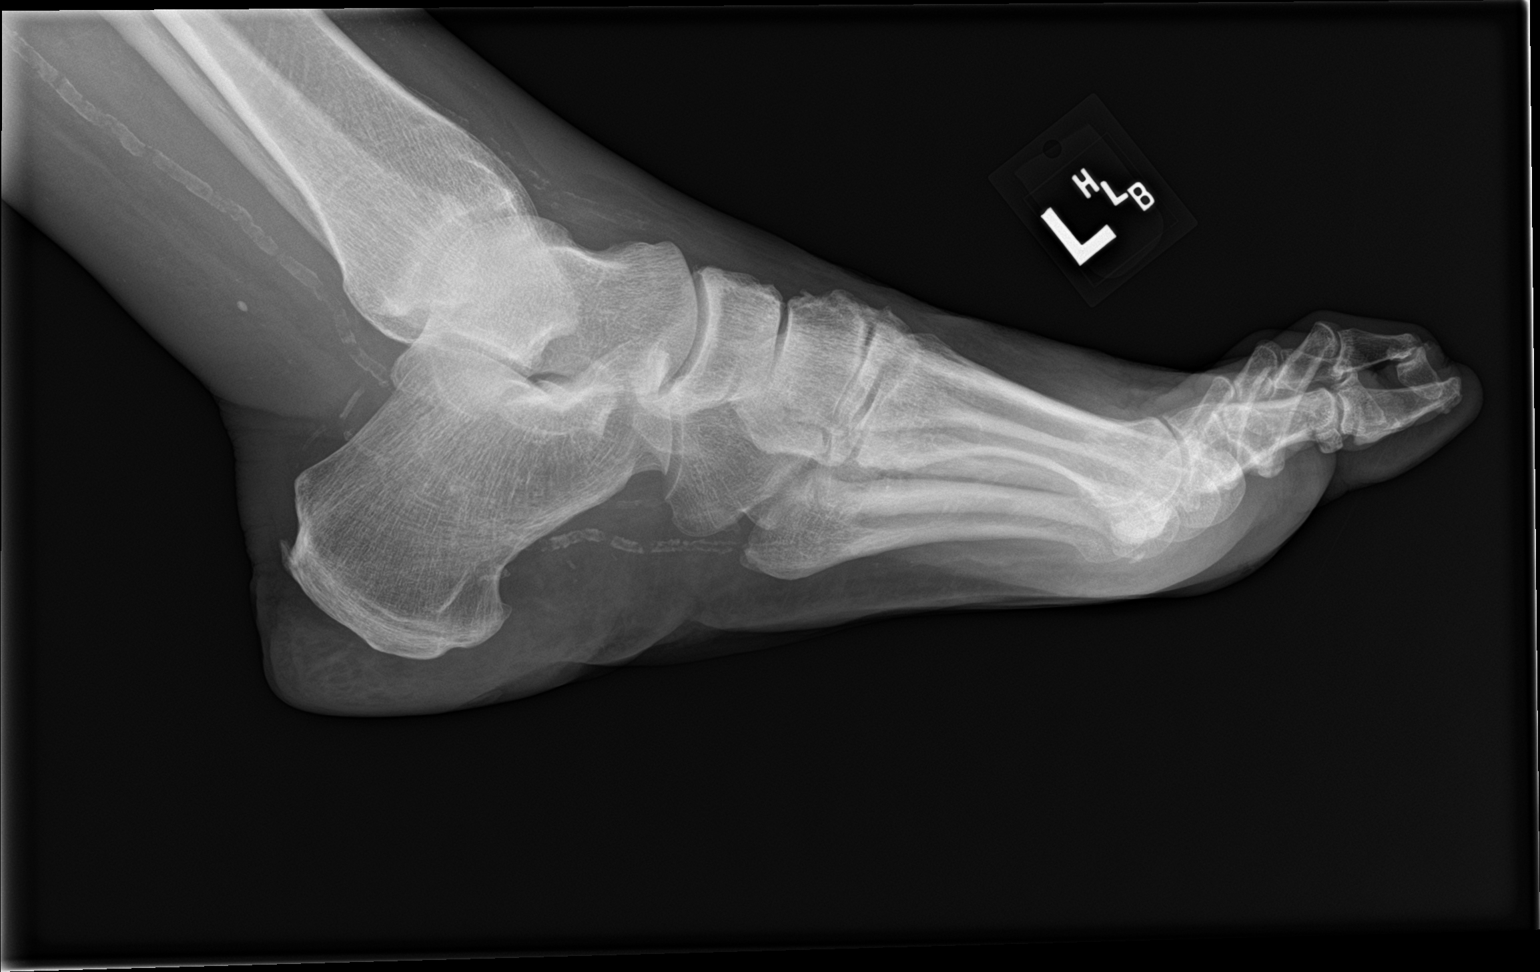

[3 of 3 positions shown; findings below may reference images not displayed]

FINDINGS: There is no evidence of fracture or dislocation. There is no
evidence of arthropathy or other focal bone abnormality. Vascular
calcifications are noted. No lytic destruction is seen to suggest
osteomyelitis.
IMPRESSION: No acute abnormality is noted.

## 2021-09-16 ENCOUNTER — Other Ambulatory Visit: Payer: Self-pay | Admitting: Student

## 2021-09-16 DIAGNOSIS — J309 Allergic rhinitis, unspecified: Secondary | ICD-10-CM

## 2021-09-27 NOTE — Telephone Encounter (Signed)
Patient called to see if we have heard anything about patient assistance application. We have not, so I called [employer] states that income verification not clear. Resent [employer] application including verification. Patient would like to know if we can give sample of novolog vials and tresiba? Please advise

## 2021-09-27 NOTE — Telephone Encounter (Signed)
Pt will stop by tomorrow to get samples

## 2021-09-28 ENCOUNTER — Other Ambulatory Visit: Payer: Self-pay

## 2021-09-28 ENCOUNTER — Ambulatory Visit (INDEPENDENT_AMBULATORY_CARE_PROVIDER_SITE_OTHER): Payer: Medicare HMO | Admitting: Orthopedic Surgery

## 2021-09-28 DIAGNOSIS — S88111A Complete traumatic amputation at level between knee and ankle, right lower leg, initial encounter: Secondary | ICD-10-CM

## 2021-09-28 DIAGNOSIS — Z89511 Acquired absence of right leg below knee: Secondary | ICD-10-CM | POA: Diagnosis not present

## 2021-09-28 DIAGNOSIS — Z89422 Acquired absence of other left toe(s): Secondary | ICD-10-CM | POA: Diagnosis not present

## 2021-09-28 NOTE — Telephone Encounter (Signed)
Patient picked up Novolog & Tresiba samples - log noted as well

## 2021-10-02 ENCOUNTER — Encounter: Payer: Self-pay | Admitting: Orthopedic Surgery

## 2021-10-02 NOTE — Progress Notes (Signed)
Office Visit Note   Patient: Gregory May           Date of Birth: 02/11/1961           MRN: [id number] Visit Date: 09/28/2021              Requested by: Timothy Lasso, MD [address] [place],  [place] [address] PCP: Timothy Lasso, MD  Chief Complaint  Patient presents with   Right Leg - Follow-up    Hx BKA    Left Foot - Follow-up      HPI: Patient is a 61 year old gentleman who presents for 2 separate issues.  Number 1 second toe amputation of the left foot.  #2 right transtibial amputation with fungal infection.  Assessment & Plan: Visit Diagnoses:  1. Below-knee amputation of right lower extremity ([place])   2. H/O amputation of lesser toe, left ([place])     Plan: Patient will follow-up with [facility] to get a new liner for his residual limb.  Patient states he has a prescription at home.  Recommended will socks on the left foot for the fungal rash on the left foot.  Continue with the liner liner for the right transtibial amputation.  Follow-Up Instructions: Return in about 4 weeks (around 10/26/2021).   Ortho Exam  Patient is alert, oriented, no adenopathy, well-dressed, normal affect, normal respiratory effort. Examination the fungal rash on the right transtibial amputation is improving there is no ulcers no drainage.  Patient does have persistent dermatitis.  Examination of the left foot patient has dry cracked skin with a fungal rash involving the left foot as well there are no open ulcers.  Imaging: No results found. No images are attached to the encounter.  Labs: Lab Results  Component Value Date   HGBA1C 7.5 (H) 08/31/2021   HGBA1C 7.0 (A) 08/17/2021   HGBA1C 7.5 (H) 05/23/2021   ESRSEDRATE 25 (H) 08/19/2021   ESRSEDRATE 8 07/15/2019   ESRSEDRATE 56 (H) 06/01/2015   CRP 1.7 (H) 08/19/2021   CRP 48 (H) 07/15/2019   REPTSTATUS 08/26/2021 FINAL 08/21/2021   GRAMSTAIN  08/21/2021    WBC PRESENT,BOTH PMN AND MONONUCLEAR NO ORGANISMS SEEN     CULT  08/21/2021    No growth aerobically or anaerobically. Performed at [facility], [place] [address], [place], [place] [address]    LABORGA STAPHYLOCOCCUS HOMINIS 08/21/2021     Lab Results  Component Value Date   ALBUMIN 4.1 08/31/2021   ALBUMIN 3.2 (L) 08/19/2021   ALBUMIN 4.5 05/23/2021    No results found for: MG Lab Results  Component Value Date   VD25OH 16 (L) 11/05/2013    No results found for: PREALBUMIN CBC EXTENDED Latest Ref Rng & Units 08/22/2021 08/21/2021 08/20/2021  WBC 4.0 - 10.5 K/uL 10.5 7.3 8.1  RBC 4.22 - 5.81 MIL/uL 4.93 5.21 4.82  HGB 13.0 - 17.0 g/dL 15.5 16.4 15.2  HCT 39.0 - 52.0 % 46.1 48.4 45.3  PLT 150 - 400 K/uL 357 351 323  NEUTROABS 1.7 - 7.7 K/uL - - -  LYMPHSABS 0.7 - 4.0 K/uL - - -     There is no height or weight on file to calculate BMI.  Orders:  No orders of the defined types were placed in this encounter.  No orders of the defined types were placed in this encounter.    Procedures: No procedures performed  Clinical Data: No additional findings.  ROS:  All other systems negative, except as noted  in the HPI. Review of Systems  Objective: Vital Signs: There were no vitals taken for this visit.  Specialty Comments:  No specialty comments available.  PMFS History: Patient Active Problem List   Diagnosis Date Noted   Osteomyelitis of finger of right hand (HCC) 08/19/2021   Cellulitis of finger of right hand 08/17/2021   Osteomyelitis of second toe of left foot (HCC)    Hepatic steatosis 06/24/2020   Back pain 04/20/2020   Flu vaccine need 04/20/2020   COVID-19 virus vaccination declined 04/20/2020   Left knee pain 04/20/2020   Allergic sinusitis 04/20/2020   Elevated liver enzymes 04/20/2020   Diastasis of rectus abdominis 07/16/2019   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 10/07/2018   Essential hypertension 06/19/2018   Peripheral neuropathy 12/19/2017   Acquired  absence of right leg below knee (HCC) 04/17/2017   Carpal tunnel syndrome 03/18/2017   Colon cancer screening 06/21/2015   Status post below knee amputation of right lower extremity (HCC) 06/07/2015   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) 06/01/2015   Diabetic polyneuropathy associated with type 2 diabetes mellitus (HCC) 12/09/2014   Diabetes mellitus with carpal tunnel syndrome (HCC) 12/09/2014   DJD (degenerative joint disease) of knee 07/21/2014   Osteoarthritis, hip, bilateral 05/25/2014   Onychomycosis 10/14/2013   Pure hypercholesterolemia 09/25/2013   Diabetic foot ulcer (HCC) 09/15/2013   Poorly controlled type 2 diabetes mellitus with complication (HCC) 09/15/1997   Past Medical History:  Diagnosis Date   Arthritis    bilateral hips   Cutaneous abscess of left foot    Deep vein thrombosis (DVT) (HCC)    Diabetes mellitus    type II   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 Date of diagnosis February 08 2014 Duration of anticoagulation: 6 months. End date 08/12/2014.  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 Duration of anticoagulation: 8 months. End date 10/11/2014.  Anticoagulant: Lovenox 120 units daily Switched to Eliquis on 05/25/2014 per patient preference    Pulmonary embolism (HCC)    Sebaceous cyst    on back of neck    Family History  Problem Relation Age of Onset   Breast cancer Mother    Cancer Mother        small intestine   Liver cancer Mother    Diabetes Mother    Diabetes Father     Diabetes Brother    Hypertension Maternal Grandmother    Heart Problems Maternal Grandmother    Diabetes Paternal Grandmother    Diabetes Paternal Grandfather    Diabetes Brother     Past Surgical History:  Procedure Laterality Date   AMPUTATION Right 06/03/2015   Procedure: Right Below Knee Amputation;  Surgeon: Nadara Mustard, MD;  Location: [facility] OR;  Service: Orthopedics;  Laterality: Right;   AMPUTATION Left 07/24/2019   Procedure: LEFT FOOT 3RD RAY AMPUTATION;  Surgeon: Nadara Mustard, MD;  Location: [facility] OR;  Service: Orthopedics;  Laterality: Left;  AMPUTATION Left 06/23/2021   Procedure: LEFT 2ND TOE AMPUTATION;  Surgeon: [name], MD;  Location: [place];  Service: Orthopedics;  Laterality: Left;   AMPUTATION Right 08/21/2021   Procedure: AMPUTATION RIGHT INDEX FINGER;  Surgeon: Orene Desanctis, MD;  Location: [place];  Service: Orthopedics;  Laterality: Right;   CLOSED REDUCTION WITH HUMER PIN INSERTION  1974   left hip   HARDWARE REMOVAL Left 07/21/2014   Procedure: HARDWARE REMOVAL;  Surgeon: Ninetta Lights, MD;  Location: [place];  Service: Orthopedics;  Laterality: Left;   I & D EXTREMITY Right 08/21/2021   Procedure: IRRIGATION AND DEBRIDEMENT RIGHT INDEX FINGER;  Surgeon: Orene Desanctis, MD;  Location: [place];  Service: Orthopedics;  Laterality: Right;   ROTATOR CUFF REPAIR Right 2005 (approx)   TOTAL HIP ARTHROPLASTY Left 07/21/2014   Procedure: TOTAL HIP ARTHROPLASTY ANTERIOR APPROACH;  Surgeon: Ninetta Lights, MD;  Location: [place];  Service: Orthopedics;  Laterality: Left;   TOTAL HIP ARTHROPLASTY Right 2006 (approx)   right hip replaced   Social History   Occupational History   Occupation: disabled  Tobacco Use   Smoking status: Never   Smokeless tobacco: Never  Vaping Use   Vaping Use: Never used  Substance and Sexual Activity   Alcohol use: Not Currently    Comment: beer and mixed drink maybe 5  times a month   Drug use: No   Sexual activity: Never

## 2021-10-18 ENCOUNTER — Telehealth: Payer: Self-pay | Admitting: Internal Medicine

## 2021-10-18 ENCOUNTER — Telehealth (INDEPENDENT_AMBULATORY_CARE_PROVIDER_SITE_OTHER): Payer: Medicare HMO | Admitting: Student

## 2021-10-18 DIAGNOSIS — L720 Epidermal cyst: Secondary | ICD-10-CM | POA: Insufficient documentation

## 2021-10-18 MED ORDER — DOXYCYCLINE HYCLATE 100 MG PO TABS: 100.0000 mg | ORAL_TABLET | Freq: Two times a day (BID) | ORAL | 0 refills | Status: AC

## 2021-10-18 NOTE — Telephone Encounter (Signed)
Pt called to report his the back of his shoulder is leaking and it smells really bad.  Offered the patient to come in this morning and this afternoon and he declined due to no transportation. ? ?Patient has been added for this afternoon @ 1:15 pm for a  MyChart Video Visit. ?

## 2021-10-18 NOTE — Patient Instructions (Signed)
Thank you, [name] for allowing [place] to provide your care today. Today we discussed . ? ?Epidermal Inclusion Cyst ?We will treat you with 7 days of antibiotics for your inclusion cyst and have you follow up with a telehealth appointment next week. If no improvement, please call our clinic to be seen sooner.  ? ?I have ordered the following labs for you: ? ?Lab Orders  ?No laboratory test(s) ordered today  ?  ? ?Referrals ordered today:  ? ?Referral Orders  ?No referral(s) requested today  ?  ? ?I have ordered the following medication/changed the following medications:  ? ?Stop the following medications: ?There are no discontinued medications.  ? ?Start the following medications: ?Meds ordered this encounter  ?Medications  ? doxycycline (VIBRA-TABS) 100 MG tablet  ?  Sig: Take 1 tablet (100 mg total) by mouth 2 (two) times daily for 7 days.  ?  Dispense:  14 tablet  ?  Refill:  0  ?  ? ?Follow up:  1 week   ? ? ?Should you have any questions or concerns please call the internal medicine clinic at [phone].   ? ?Gregory May, D.O. ?[facility] ?  ?

## 2021-10-18 NOTE — Progress Notes (Signed)
? ?  CC: Cyst on back ? ?This is a telephone encounter between Ezeriah T Mcalexander and Sanjuana Letters on 10/18/2021 for a cyst that has been painful and draining on his back. The visit was conducted with the patient located at home and Sanjuana Letters at [facility]. The patient's identity was confirmed using their DOB and current address. The patient has consented to being evaluated through a telephone encounter and understands the associated risks (an examination cannot be done and the patient may need to come in for an appointment) / benefits (allows the patient to remain at home, decreasing exposure to coronavirus). I personally spent 11 minutes on medical discussion.  ? ?HPI: ? ?Mr.Wendle EDMAN KAVA is a 61 y.o. with PMH as below.  ? ?Please see A&P for assessment of the patient's acute and chronic medical conditions.  ? ?Past Medical History:  ?Diagnosis Date  ? 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 Cutaneous abscess of left foot   ? Deep vein thrombosis (DVT) (HCC)   ? Diabetes mellitus   ? type II  ? Diabetic ulcer of heel ([place])   ? Right heel  ? DJD (degenerative joint disease)   ? DVT (deep venous thrombosis) ([place]) 02/08/2014  ? Proximal provoked. Date of diagnosis February 08 2014 Duration of anticoagulation: 6 months. End date 08/12/2014.  Anticoagulant: Lovenox 120 units daily Switched to Eliquis on 05/25/2014     ? Ear drum perforation, right 03/06/2019  ? Non-pressure chronic ulcer of right calf, limited to breakdown of skin ([place]) 04/17/2017  ? Pulmonary emboli ([place]) 02/08/2014  ? Date of diagnosis February 08 2014, on chest CTA Hospitalized for 3 days Had some symptoms of shortness of breath, and chest pain With intercurrent DVT of the left LE. Duration of anticoagulation: 8 months. End date 10/11/2014.  Anticoagulant: Lovenox 120 units daily Switched to Eliquis on 05/25/2014 per patient preference   ? Pulmonary embolism ([place])   ? Sebaceous cyst   ? on back of neck  ? ?Review of Systems:   ?Denies systemic symptoms of  fevers, chills. ?Positive for painful, red, draining cysts on upper back.  ? ?Assessment & Plan:  ? ?See Encounters Tab for problem based charting. ? ?Patient discussed with Dr. Evette Doffing ? ?Vasili Tyrese Capriotti ?Internal Medicine Resident ? ?

## 2021-10-18 NOTE — Telephone Encounter (Signed)
Patient returned call. States area on left shoulder blade is "red, inflamed, the size of a ping pong ball." States it was present on day of hosp d/c on 08/23/21 but only noticed it yesterday when "shirt stuck to it." States, "there's blood and goo, it's stinking and oozing." Denies fever/chills. No pain except when shirt sticks to it. States he had this ~3 years ago on neck and [facility] "lanced it and pulled the sac out." States there is one on right shoulder as well but is not red or inflamed at this time. Patient has no transportation till tomorrow after 3. He is willing to have virtual call today if appropriate. ? ?CBG last PM 88. Ate 2 cookies and AM fasting CBG this AM 136. ?

## 2021-10-18 NOTE — Telephone Encounter (Signed)
Attempted to reach patient to discuss s/sx of infection. No answer. Left message on VM requesting return call as soon as possible. ?

## 2021-10-18 NOTE — Assessment & Plan Note (Signed)
Assessment: ?Patient endorses wound on his back that has been draining for the past two days. Per Gregory May, the area has become red and painful to touch. He had a similar cyst on his back in the past that was diagnosed as a sebaceous cyst. He had an I&D performed in our clinic in 2018.  ? ?He notes the cyst today is similar to when he had the inclusion cyst in the past. He denies systemic symptoms. I was unable to visualize the cyst myself but it sounds as though it is an inflamed/infected epidermal inclusion cyst. Will plan to treat with 7 days of doxycycline and reassess patient via telehealth appointment next week. If no improvement in his symptoms, patient instructed to call back.  ? ?With his history of multiple inclusion cysts, uncontrolled diabetes, and amputations, if patient would like those to be removed I believe it would be best to send him to dermatology. We will treat with abx for now and work on controlling his diabetes.  ? ?Plan: ?-doxycycline 100 mg BID for 7 days (end date 3/15) ?-follow up with telehealth appointment next week ?-if no improvement, patient to call clinic back.  ?

## 2021-10-18 NOTE — Telephone Encounter (Signed)
Agree, hopefully he calls back so we can discuss further. Thanks! ?

## 2021-10-19 ENCOUNTER — Encounter: Payer: Medicare HMO | Admitting: Internal Medicine

## 2021-10-19 ENCOUNTER — Other Ambulatory Visit: Payer: Self-pay | Admitting: Endocrinology

## 2021-10-19 DIAGNOSIS — E1165 Type 2 diabetes mellitus with hyperglycemia: Secondary | ICD-10-CM

## 2021-10-19 NOTE — Progress Notes (Signed)
Internal Medicine Clinic Attending  Case discussed with Dr. Katsadouros  At the time of the visit.  We reviewed the resident's history and pertinent patient test results.  I agree with the assessment, diagnosis, and plan of care documented in the resident's note.  

## 2021-10-26 ENCOUNTER — Ambulatory Visit (INDEPENDENT_AMBULATORY_CARE_PROVIDER_SITE_OTHER): Payer: Medicare HMO | Admitting: Student

## 2021-10-26 ENCOUNTER — Ambulatory Visit (INDEPENDENT_AMBULATORY_CARE_PROVIDER_SITE_OTHER): Payer: Medicare HMO | Admitting: Orthopedic Surgery

## 2021-10-26 DIAGNOSIS — S88111A Complete traumatic amputation at level between knee and ankle, right lower leg, initial encounter: Secondary | ICD-10-CM

## 2021-10-26 DIAGNOSIS — Z89422 Acquired absence of other left toe(s): Secondary | ICD-10-CM

## 2021-10-26 DIAGNOSIS — L720 Epidermal cyst: Secondary | ICD-10-CM

## 2021-10-26 NOTE — Progress Notes (Signed)
? ?  CC: follow up infected epidermal inclusion cyst ? ?This is a telephone encounter between Gregory May and Thalia Bloodgood on 10/26/2021 for follow up after completing course of antibiotic for epidermal inclusion cyst. The visit was conducted with the patient located at home and Thalia Bloodgood at [facility]. The patient's identity was confirmed using their DOB and current address. The patient has consented to being evaluated through a telephone encounter and understands the associated risks (an examination cannot be done and the patient may need to come in for an appointment) / benefits (allows the patient to remain at home, decreasing exposure to coronavirus). I personally spent 6 minutes on medical discussion.  ? ?HPI: ? ?Gregory May is a 61 y.o. with PMH as below.  ? ?Please see A&P for assessment of the patient's acute and chronic medical conditions.  ? ?Past Medical History:  ?Diagnosis Date  ? 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 Cutaneous abscess of left foot   ? Deep vein thrombosis (DVT) (HCC)   ? Diabetes mellitus   ? type II  ? Diabetic ulcer of heel (HCC)   ? Right heel  ? DJD (degenerative joint disease)   ? DVT (deep venous thrombosis) (HCC) 02/08/2014  ? Proximal provoked. Date of diagnosis February 08 2014 Duration of anticoagulation: 6 months. End date 08/12/2014.  Anticoagulant: Lovenox 120 units daily Switched to Eliquis on 05/25/2014     ? Ear drum perforation, right 03/06/2019  ? Non-pressure chronic ulcer of right calf, limited to breakdown of skin (HCC) 04/17/2017  ? Pulmonary emboli (HCC) 02/08/2014  ? Date of diagnosis February 08 2014, on chest CTA Hospitalized for 3 days Had some symptoms of shortness of breath, and chest pain With intercurrent DVT of the left LE. Duration of anticoagulation: 8 months. End date 10/11/2014.  Anticoagulant: Lovenox 120 units daily Switched to Eliquis on 05/25/2014 per patient preference   ? Pulmonary embolism (HCC)   ? Sebaceous cyst   ? on back of neck   ? ?Review of Systems:  ? ?Negative for fever, chills, no drainage, redness, or pain from inclusion cyst ? ? ?Assessment & Plan:  ? ?Epidermal inclusion cyst ?Assessment: ?Follow up telehealth appointment with patient after treatment for epidermal inclusion cyst located on his back. He endorses improvement in erythema, tenderness, and draining. Stating the area is completely flat. Discussed moving forward if frequent episodes of inflammation can consider dermatology referral. We also discussed keeping the area clean, not touching or picking at the areas.  ? ?Plan: ?-follow up as needed for flair or infection ?-consider referral to dermatology ?  ?Patient discussed with Dr. Antony Contras ? ?Gregory May ?Internal Medicine Resident ? ?

## 2021-10-26 NOTE — Assessment & Plan Note (Addendum)
Assessment: ?Follow up telehealth appointment with patient after treatment for epidermal inclusion cyst located on his back with a 7 day course of doxycycline. He endorses improvement in erythema, tenderness, and draining. Stating the area is completely flat. Discussed moving forward if frequent episodes of inflammation can consider dermatology referral. We also discussed keeping the area clean, not touching or picking at the areas.  ? ?Plan: ?-follow up as needed for flair or infection ?-consider referral to dermatology ? ?

## 2021-10-26 NOTE — Telephone Encounter (Signed)
Patient dropped off income verification and the ENTIRE application was faxed again today. ?

## 2021-10-29 ENCOUNTER — Encounter: Payer: Self-pay | Admitting: Orthopedic Surgery

## 2021-10-29 NOTE — Progress Notes (Signed)
? ?Office Visit Note ?  ?Patient: Gregory May           ?Date of Birth: 1961/05/11           ?MRN: [id number] ?Visit Date: 10/26/2021 ?             ?Requested by: [name], MD ?[address] ?[place],  [place] [address] ?PCP: [name], MD ? ?Chief Complaint  ?Patient presents with  ? Left Foot - Follow-up  ?  06/23/21 left second toe amputation  ? ? ? ? ?HPI: ?Patient is a 61 year old gentleman who is over 6 years out from a right transtibial amputation in 4 months status post left second toe amputation. ? ?Assessment & Plan: ?Visit Diagnoses:  ?1. Below-knee amputation of right lower extremity (HCC)   ?2. H/O amputation of lesser toe, left (HCC)   ? ? ?Plan: Nails were trimmed on the left foot.  Recommended continuing compression for the venous stasis of the left lower extremity.  No intervention necessary for the prosthesis on the right. ? ?Follow-Up Instructions: Return in about 3 months (around 01/26/2022).  ? ?Ortho Exam ? ?Patient is alert, oriented, no adenopathy, well-dressed, normal affect, normal respiratory effort. ?Examination patient has a well-healed right transtibial amputation.  There is a left foot the second toe incision is healing well there is no cellulitis or drainage no signs of infection.  Patient has stable venous stasis insufficiency on the left without ulceration or cellulitis. ? ?Imaging: ?No results found. ?No images are attached to the encounter. ? ?Labs: ?Lab Results  ?Component Value Date  ? HGBA1C 7.5 (H) 08/31/2021  ? HGBA1C 7.0 (A) 08/17/2021  ? HGBA1C 7.5 (H) 05/23/2021  ? ESRSEDRATE 25 (H) 08/19/2021  ? ESRSEDRATE 8 07/15/2019  ? ESRSEDRATE 56 (H) 06/01/2015  ? CRP 1.7 (H) 08/19/2021  ? CRP 48 (H) 07/15/2019  ? REPTSTATUS 08/26/2021 FINAL 08/21/2021  ? GRAMSTAIN  08/21/2021  ?  WBC PRESENT,BOTH PMN AND MONONUCLEAR ?NO ORGANISMS SEEN ?  ? CULT  08/21/2021  ?  No growth aerobically or anaerobically. ?Performed at [facility], [address]  [place], [place] [address] ?  ? LABORGA STAPHYLOCOCCUS HOMINIS 08/21/2021  ? ? ? ?Lab Results  ?Component Value Date  ? ALBUMIN 4.1 08/31/2021  ? ALBUMIN 3.2 (L) 08/19/2021  ? ALBUMIN 4.5 05/23/2021  ? ? ?No results found for: MG ?Lab Results  ?Component Value Date  ? VD25OH 16 (L) 11/05/2013  ? ? ?No results found for: PREALBUMIN ?CBC EXTENDED Latest Ref Rng & Units 08/22/2021 08/21/2021 08/20/2021  ?WBC 4.0 - 10.5 K/uL 10.5 7.3 8.1  ?RBC 4.22 - 5.81 MIL/uL 4.93 5.21 4.82  ?HGB 13.0 - 17.0 g/dL 50.3 54.6 56.8  ?HCT 39.0 - 52.0 % 46.1 48.4 45.3  ?PLT 150 - 400 K/uL 357 351 323  ?NEUTROABS 1.7 - 7.7 K/uL - - -  ?LYMPHSABS 0.7 - 4.0 K/uL - - -  ? ? ? ?There is no height or weight on file to calculate BMI. ? ?Orders:  ?No orders of the defined types were placed in this encounter. ? ?No orders of the defined types were placed in this encounter. ? ? ? Procedures: ?No procedures performed ? ?Clinical Data: ?No additional findings. ? ?ROS: ? ?All other systems negative, except as noted in the HPI. ?Review of Systems ? ?Objective: ?Vital Signs: There were no vitals taken for this visit. ? ?Specialty Comments:  ?No specialty comments available. ? ?PMFS History: ?Patient Active Problem  List  ? Diagnosis Date Noted  ? Epidermal inclusion cyst 10/18/2021  ? 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 Cellulitis of finger of right hand 08/17/2021  ? Osteomyelitis of second toe of left foot (HCC)   ? Hepatic steatosis 06/24/2020  ? Back pain 04/20/2020  ? Flu vaccine need 04/20/2020  ? COVID-19 virus vaccination declined 04/20/2020  ? Left knee pain 04/20/2020  ? Allergic sinusitis 04/20/2020  ? Elevated liver enzymes 04/20/2020  ? Diastasis of rectus abdominis 07/16/2019  ? 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 10/07/2018  ? Essential hypertension 06/19/2018  ? Peripheral neuropathy 12/19/2017  ? Acquired absence of right leg below knee (HCC) 04/17/2017  ? Carpal tunnel syndrome  03/18/2017  ? Colon cancer screening 06/21/2015  ? Status post below knee amputation of right lower extremity (HCC) 06/07/2015  ? 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) 06/01/2015  ? Diabetic polyneuropathy associated with type 2 diabetes mellitus (HCC) 12/09/2014  ? Diabetes mellitus with carpal tunnel syndrome (HCC) 12/09/2014  ? DJD (degenerative joint disease) of knee 07/21/2014  ? Osteoarthritis, hip, bilateral 05/25/2014  ? Onychomycosis 10/14/2013  ? Pure hypercholesterolemia 09/25/2013  ? Diabetic foot ulcer (HCC) 09/15/2013  ? Poorly controlled type 2 diabetes mellitus with complication (HCC) 09/15/1997  ? ?Past Medical History:  ?Diagnosis Date  ? 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 Cutaneous abscess of left foot   ? Deep vein thrombosis (DVT) (HCC)   ? Diabetes mellitus   ? type II  ? Diabetic ulcer of heel (HCC)   ? Right heel  ? DJD (degenerative joint disease)   ? DVT (deep venous thrombosis) (HCC) 02/08/2014  ? Proximal provoked. Date of diagnosis February 08 2014 Duration of anticoagulation: 6 months. End date 08/12/2014.  Anticoagulant: Lovenox 120 units daily Switched to Eliquis on 05/25/2014     ? Ear drum perforation, right 03/06/2019  ? Non-pressure chronic ulcer of right calf, limited to breakdown of skin (HCC) 04/17/2017  ? Pulmonary emboli (HCC) 02/08/2014  ? Date of diagnosis February 08 2014, on chest CTA Hospitalized for 3 days Had some symptoms of shortness of breath, and chest pain With intercurrent DVT of the left LE. Duration of anticoagulation: 8 months. End date 10/11/2014.  Anticoagulant: Lovenox 120 units daily Switched to Eliquis on 05/25/2014 per patient preference   ? Pulmonary embolism (HCC)   ? Sebaceous cyst   ? on back of neck  ?  ?Family History  ?Problem Relation Age of Onset  ? Breast cancer Mother   ? Cancer Mother   ?     small intestine  ? Liver cancer Mother   ? Diabetes Mother   ? Diabetes Father   ? Diabetes Brother   ? Hypertension Maternal Grandmother   ? Heart Problems  Maternal Grandmother   ? Diabetes Paternal Grandmother   ? Diabetes Paternal Grandfather   ? Diabetes Brother   ?  ?Past Surgical History:  ?Procedure Laterality Date  ? AMPUTATION Right 06/03/2015  ? Procedure: Right Below Knee Amputation;  Surgeon: Nadara Mustard, MD;  Location: [facility] OR;  Service: Orthopedics;  Laterality: Right;  ? AMPUTATION Left 07/24/2019  ? Procedure: LEFT FOOT 3RD RAY AMPUTATION;  Surgeon: Nadara Mustard, MD;  Location: [facility] OR;  Service: Orthopedics;  Laterality: Left;  ? AMPUTATION Left 06/23/2021  ? Procedure: LEFT 2ND TOE AMPUTATION;  Surgeon: Nadara Mustard, MD;  Location: [facility] OR;  Service: Orthopedics;  Laterality: Left;  ? AMPUTATION Right 08/21/2021  ? Procedure: AMPUTATION RIGHT INDEX FINGER;  Surgeon: Gomez CleverlySpears, James, MD;  [facility];  Service: Orthopedics;  Laterality: Right;  ? CLOSED REDUCTION WITH HUMER PIN INSERTION  1974  ? left hip  ? HARDWARE REMOVAL Left 07/21/2014  ? Procedure: HARDWARE REMOVAL;  Surgeon: Loreta Aveaniel F Murphy, MD;  [facility];  Service: Orthopedics;  Laterality: Left;  ? I & D EXTREMITY Right 08/21/2021  ? Procedure: IRRIGATION AND DEBRIDEMENT RIGHT INDEX FINGER;  Surgeon: Gomez CleverlySpears, James, MD;  [facility];  Service: Orthopedics;  Laterality: Right;  ? ROTATOR CUFF REPAIR Right 2005 (approx)  ? TOTAL HIP ARTHROPLASTY Left 07/21/2014  ? Procedure: TOTAL HIP ARTHROPLASTY ANTERIOR APPROACH;  Surgeon: Loreta Aveaniel F Murphy, MD;  [facility];  Service: Orthopedics;  Laterality: Left;  ? TOTAL HIP ARTHROPLASTY Right 2006 (approx)  ? right hip replaced  ? ?Social History  ? ?Occupational History  ? Occupation: disabled  ?Tobacco Use  ? Smoking status: Never  ? Smokeless tobacco: Never  ?Vaping Use  ? Vaping Use: Never used  ?Substance and Sexual Activity  ? Alcohol use: Not Currently  ?  Comment: beer and mixed drink maybe 5  times a month  ? Drug use: No  ? Sexual activity: Never  ? ? ? ? ? ?

## 2021-11-03 NOTE — Progress Notes (Signed)
Internal Medicine Clinic Attending  Case discussed with Dr. Katsadouros  At the time of the visit.  We reviewed the resident's history and exam and pertinent patient test results.  I agree with the assessment, diagnosis, and plan of care documented in the resident's note.  

## 2021-11-15 ENCOUNTER — Emergency Department (HOSPITAL_COMMUNITY): Payer: Medicare HMO

## 2021-11-15 ENCOUNTER — Observation Stay (HOSPITAL_COMMUNITY)
Admission: EM | Admit: 2021-11-15 | Discharge: 2021-11-17 | Disposition: A | Discharge status: Home / Self Care | Payer: Medicare HMO | Attending: Internal Medicine | Admitting: Internal Medicine

## 2021-11-15 ENCOUNTER — Other Ambulatory Visit: Payer: Self-pay

## 2021-11-15 DIAGNOSIS — H409 Unspecified glaucoma: Secondary | ICD-10-CM | POA: Insufficient documentation

## 2021-11-15 DIAGNOSIS — H3411 Central retinal artery occlusion, right eye: Principal | ICD-10-CM | POA: Insufficient documentation

## 2021-11-15 DIAGNOSIS — Z7901 Long term (current) use of anticoagulants: Secondary | ICD-10-CM | POA: Diagnosis not present

## 2021-11-15 DIAGNOSIS — Z7902 Long term (current) use of antithrombotics/antiplatelets: Secondary | ICD-10-CM | POA: Insufficient documentation

## 2021-11-15 DIAGNOSIS — I10 Essential (primary) hypertension: Secondary | ICD-10-CM | POA: Insufficient documentation

## 2021-11-15 DIAGNOSIS — I480 Paroxysmal atrial fibrillation: Secondary | ICD-10-CM | POA: Insufficient documentation

## 2021-11-15 DIAGNOSIS — Z833 Family history of diabetes mellitus: Secondary | ICD-10-CM | POA: Insufficient documentation

## 2021-11-15 DIAGNOSIS — J322 Chronic ethmoidal sinusitis: Secondary | ICD-10-CM | POA: Diagnosis not present

## 2021-11-15 DIAGNOSIS — Z713 Dietary counseling and surveillance: Secondary | ICD-10-CM | POA: Insufficient documentation

## 2021-11-15 DIAGNOSIS — R202 Paresthesia of skin: Secondary | ICD-10-CM | POA: Insufficient documentation

## 2021-11-15 DIAGNOSIS — E669 Obesity, unspecified: Secondary | ICD-10-CM | POA: Insufficient documentation

## 2021-11-15 DIAGNOSIS — M869 Osteomyelitis, unspecified: Secondary | ICD-10-CM | POA: Insufficient documentation

## 2021-11-15 DIAGNOSIS — Z89021 Acquired absence of right finger(s): Secondary | ICD-10-CM | POA: Diagnosis not present

## 2021-11-15 DIAGNOSIS — Z7984 Long term (current) use of oral hypoglycemic drugs: Secondary | ICD-10-CM | POA: Insufficient documentation

## 2021-11-15 DIAGNOSIS — Z79899 Other long term (current) drug therapy: Secondary | ICD-10-CM | POA: Insufficient documentation

## 2021-11-15 DIAGNOSIS — I700 Atherosclerosis of aorta: Secondary | ICD-10-CM | POA: Diagnosis not present

## 2021-11-15 DIAGNOSIS — Z86711 Personal history of pulmonary embolism: Secondary | ICD-10-CM | POA: Insufficient documentation

## 2021-11-15 DIAGNOSIS — E11621 Type 2 diabetes mellitus with foot ulcer: Secondary | ICD-10-CM | POA: Insufficient documentation

## 2021-11-15 DIAGNOSIS — R2689 Other abnormalities of gait and mobility: Secondary | ICD-10-CM | POA: Diagnosis not present

## 2021-11-15 DIAGNOSIS — E1140 Type 2 diabetes mellitus with diabetic neuropathy, unspecified: Secondary | ICD-10-CM | POA: Diagnosis not present

## 2021-11-15 DIAGNOSIS — Z8249 Family history of ischemic heart disease and other diseases of the circulatory system: Secondary | ICD-10-CM | POA: Diagnosis not present

## 2021-11-15 DIAGNOSIS — M25461 Effusion, right knee: Secondary | ICD-10-CM | POA: Insufficient documentation

## 2021-11-15 DIAGNOSIS — Z89511 Acquired absence of right leg below knee: Secondary | ICD-10-CM | POA: Insufficient documentation

## 2021-11-15 DIAGNOSIS — E785 Hyperlipidemia, unspecified: Secondary | ICD-10-CM | POA: Diagnosis not present

## 2021-11-15 DIAGNOSIS — R9431 Abnormal electrocardiogram [ECG] [EKG]: Secondary | ICD-10-CM | POA: Insufficient documentation

## 2021-11-15 DIAGNOSIS — E1169 Type 2 diabetes mellitus with other specified complication: Secondary | ICD-10-CM | POA: Insufficient documentation

## 2021-11-15 DIAGNOSIS — Z8673 Personal history of transient ischemic attack (TIA), and cerebral infarction without residual deficits: Secondary | ICD-10-CM | POA: Diagnosis not present

## 2021-11-15 DIAGNOSIS — I63511 Cerebral infarction due to unspecified occlusion or stenosis of right middle cerebral artery: Secondary | ICD-10-CM | POA: Insufficient documentation

## 2021-11-15 DIAGNOSIS — I253 Aneurysm of heart: Secondary | ICD-10-CM | POA: Insufficient documentation

## 2021-11-15 DIAGNOSIS — I80223 Phlebitis and thrombophlebitis of popliteal vein, bilateral: Secondary | ICD-10-CM | POA: Insufficient documentation

## 2021-11-15 DIAGNOSIS — E1139 Type 2 diabetes mellitus with other diabetic ophthalmic complication: Secondary | ICD-10-CM | POA: Insufficient documentation

## 2021-11-15 DIAGNOSIS — I358 Other nonrheumatic aortic valve disorders: Secondary | ICD-10-CM | POA: Insufficient documentation

## 2021-11-15 DIAGNOSIS — R519 Headache, unspecified: Secondary | ICD-10-CM | POA: Diagnosis not present

## 2021-11-15 DIAGNOSIS — R29702 NIHSS score 2: Secondary | ICD-10-CM | POA: Insufficient documentation

## 2021-11-15 DIAGNOSIS — I498 Other specified cardiac arrhythmias: Secondary | ICD-10-CM | POA: Insufficient documentation

## 2021-11-15 DIAGNOSIS — H538 Other visual disturbances: Secondary | ICD-10-CM | POA: Insufficient documentation

## 2021-11-15 DIAGNOSIS — E119 Type 2 diabetes mellitus without complications: Secondary | ICD-10-CM | POA: Insufficient documentation

## 2021-11-15 DIAGNOSIS — R2681 Unsteadiness on feet: Secondary | ICD-10-CM | POA: Insufficient documentation

## 2021-11-15 DIAGNOSIS — G319 Degenerative disease of nervous system, unspecified: Secondary | ICD-10-CM | POA: Diagnosis not present

## 2021-11-15 DIAGNOSIS — Z86718 Personal history of other venous thrombosis and embolism: Secondary | ICD-10-CM | POA: Diagnosis not present

## 2021-11-15 DIAGNOSIS — Q2112 Patent foramen ovale: Secondary | ICD-10-CM | POA: Insufficient documentation

## 2021-11-15 DIAGNOSIS — G546 Phantom limb syndrome with pain: Secondary | ICD-10-CM | POA: Insufficient documentation

## 2021-11-15 DIAGNOSIS — I8012 Phlebitis and thrombophlebitis of left femoral vein: Secondary | ICD-10-CM | POA: Insufficient documentation

## 2021-11-15 DIAGNOSIS — H3410 Central retinal artery occlusion, unspecified eye: Secondary | ICD-10-CM | POA: Diagnosis present

## 2021-11-15 DIAGNOSIS — E1151 Type 2 diabetes mellitus with diabetic peripheral angiopathy without gangrene: Secondary | ICD-10-CM | POA: Insufficient documentation

## 2021-11-15 DIAGNOSIS — I6523 Occlusion and stenosis of bilateral carotid arteries: Secondary | ICD-10-CM | POA: Diagnosis not present

## 2021-11-15 DIAGNOSIS — H5461 Unqualified visual loss, right eye, normal vision left eye: Secondary | ICD-10-CM | POA: Diagnosis not present

## 2021-11-15 DIAGNOSIS — Z7982 Long term (current) use of aspirin: Secondary | ICD-10-CM | POA: Diagnosis not present

## 2021-11-15 DIAGNOSIS — H42 Glaucoma in diseases classified elsewhere: Secondary | ICD-10-CM | POA: Insufficient documentation

## 2021-11-15 DIAGNOSIS — M1990 Unspecified osteoarthritis, unspecified site: Secondary | ICD-10-CM | POA: Insufficient documentation

## 2021-11-15 DIAGNOSIS — Z6828 Body mass index (BMI) 28.0-28.9, adult: Secondary | ICD-10-CM | POA: Insufficient documentation

## 2021-11-15 DIAGNOSIS — Z794 Long term (current) use of insulin: Secondary | ICD-10-CM | POA: Diagnosis not present

## 2021-11-15 DIAGNOSIS — I77810 Thoracic aortic ectasia: Secondary | ICD-10-CM | POA: Insufficient documentation

## 2021-11-15 DIAGNOSIS — F1411 Cocaine abuse, in remission: Secondary | ICD-10-CM | POA: Insufficient documentation

## 2021-11-15 DIAGNOSIS — I82512 Chronic embolism and thrombosis of left femoral vein: Secondary | ICD-10-CM | POA: Insufficient documentation

## 2021-11-15 DIAGNOSIS — Z7182 Exercise counseling: Secondary | ICD-10-CM | POA: Insufficient documentation

## 2021-11-15 DIAGNOSIS — L97419 Non-pressure chronic ulcer of right heel and midfoot with unspecified severity: Secondary | ICD-10-CM | POA: Insufficient documentation

## 2021-11-15 DIAGNOSIS — F1091 Alcohol use, unspecified, in remission: Secondary | ICD-10-CM | POA: Insufficient documentation

## 2021-11-15 LAB — COMPREHENSIVE METABOLIC PANEL
ALT: 47 U/L — ABNORMAL HIGH (ref 0–44)
AST: 44 U/L — ABNORMAL HIGH (ref 15–41)
Albumin: 3.8 g/dL (ref 3.5–5.0)
Alkaline Phosphatase: 49 U/L (ref 38–126)
Anion gap: 9 (ref 5–15)
BUN: 20 mg/dL (ref 6–20)
CO2: 24 mmol/L (ref 22–32)
Calcium: 9.8 mg/dL (ref 8.9–10.3)
Chloride: 104 mmol/L (ref 98–111)
Creatinine, Ser: 0.83 mg/dL (ref 0.61–1.24)
GFR, Estimated: >60 mL/min (ref >60)
Glucose, Bld: 130 mg/dL — ABNORMAL HIGH (ref 70–99)
Potassium: 4.2 mmol/L (ref 3.5–5.1)
Sodium: 137 mmol/L (ref 135–145)
Total Bilirubin: 0.5 mg/dL (ref 0.3–1.2)
Total Protein: 6.8 g/dL (ref 6.5–8.1)

## 2021-11-15 LAB — CBC
HCT: 48.3 % (ref 39.0–52.0)
Hemoglobin: 15.6 g/dL (ref 13.0–17.0)
MCH: 31.4 pg (ref 26.0–34.0)
MCHC: 32.3 g/dL (ref 30.0–36.0)
MCV: 97.2 fL (ref 80.0–100.0)
Platelets: 300 10*3/uL (ref 150–400)
RBC: 4.97 MIL/uL (ref 4.22–5.81)
RDW: 14.6 % (ref 11.5–15.5)
WBC: 10.1 10*3/uL (ref 4.0–10.5)
nRBC: 0 % (ref 0.0–0.2)

## 2021-11-15 LAB — DIFFERENTIAL
Abs Immature Granulocytes: 0.03 10*3/uL (ref 0.00–0.07)
Basophils Absolute: 0 10*3/uL (ref 0.0–0.1)
Basophils Relative: 0 %
Eosinophils Absolute: 0.1 10*3/uL (ref 0.0–0.5)
Eosinophils Relative: 1 %
Immature Granulocytes: 0 %
Lymphocytes Relative: 22 %
Lymphs Abs: 2.2 10*3/uL (ref 0.7–4.0)
Monocytes Absolute: 1.1 10*3/uL — ABNORMAL HIGH (ref 0.1–1.0)
Monocytes Relative: 11 %
Neutro Abs: 6.7 10*3/uL (ref 1.7–7.7)
Neutrophils Relative %: 66 %

## 2021-11-15 LAB — APTT: aPTT: 27 seconds (ref 24–36)

## 2021-11-15 LAB — PROTIME-INR
INR: 1 (ref 0.8–1.2)
Prothrombin Time: 12.9 seconds (ref 11.4–15.2)

## 2021-11-15 MED ORDER — ASPIRIN 325 MG PO TABS
325.0000 mg | ORAL_TABLET | Freq: Every day | ORAL | Status: DC
  Administered 2021-11-16 – 2021-11-17 (×2): 325 mg via ORAL
  Filled 2021-11-15 (×2): qty 1

## 2021-11-15 MED ORDER — CLOPIDOGREL BISULFATE 75 MG PO TABS
75.0000 mg | ORAL_TABLET | Freq: Every day | ORAL | Status: DC
  Administered 2021-11-16 – 2021-11-17 (×2): 75 mg via ORAL
  Filled 2021-11-15 (×2): qty 1

## 2021-11-15 MED ORDER — ENOXAPARIN SODIUM 40 MG/0.4ML IJ SOSY
40.0000 mg | PREFILLED_SYRINGE | Freq: Every day | INTRAMUSCULAR | Status: DC
  Administered 2021-11-16 – 2021-11-17 (×2): 40 mg via SUBCUTANEOUS
  Filled 2021-11-15 (×2): qty 0.4

## 2021-11-15 MED ORDER — ACETAMINOPHEN 650 MG RE SUPP: 650.0000 mg | Freq: Four times a day (QID) | RECTAL | Status: DC | PRN

## 2021-11-15 MED ORDER — ACETAMINOPHEN 325 MG PO TABS: 650.0000 mg | ORAL_TABLET | Freq: Four times a day (QID) | ORAL | Status: DC | PRN

## 2021-11-15 MED ORDER — IOHEXOL 350 MG/ML SOLN
75.0000 mL | Freq: Once | INTRAVENOUS | Status: AC | PRN
  Administered 2021-11-15: 75 mL via INTRAVENOUS

## 2021-11-15 NOTE — Hospital Course (Addendum)
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 ? ?Gregory May is a 61 y.o. 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  ? ?He presents for stroke rule out after being evaluated by Dr. Gillian Scarce of ophthalmology  for acute R eye vision loss.. Patient states that he was in his usual state of health the night before admission when he noticed that his vision became blurry. He felt like there was a fog over his vision or that his eyewear might need cleaning. He had no redness or pain with the onset of this blurry vision. He also denied headache or any other neurological deficits. Patient went to sleep and when he awoke he noticed that he had the same issue. He covered his left eye and noted that he could not see out of his right eye. He subsequently called his ophthalmologist. He was evaluated in their office and he was diagnosed with central retinal artery occlusion. He was sent to the ED for further work up.  ? ?He denies numbness, tingling, weakness, chest pain, shortness of breath, N/V, jaw pain, previous similar episodes. ? ?Feels like he has had slight improvement in vision since presenting to the hospital.  ? ?Novolog 15u w/ breakfast, 16-20u with lunch and dinner, tresiba 6 u (prescribed 10-12u but only using 6u to attempt to stretch it out due to insurance issues), metformin 1500mg  daily, pioglitazone 30mg  qd, jardiance 25mg  qd ?Gabapentin  ?Pravastatin ? ?PMH ?No strokes ?HLD pravastatin  ?T2DM Dr. Dwyane Dee, A1c 7.1  ?Pins in hip and was not moving d/t pain got DVT and PE no other clot since then  ? ?Allergies:  ? ? 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  ? ?

## 2021-11-15 NOTE — ED Triage Notes (Signed)
Pt arrived from [facility], this morning pt noticed a blurry gray vision in his right eye.  ? ?Pt states he has a mild headache  ?No weakness, alert and oriented.  ? ? ?Hx of type 2 DM  ? ?

## 2021-11-15 NOTE — ED Provider Triage Note (Signed)
Emergency Medicine Provider Triage Evaluation Note ? ?Keegen Robyne Peers , a 61 y.o. male  was evaluated in triage.  Pt complains of vision loss to the right eye that began around 10 PM last night.  Evaluated by Dr. Gillian Scarce of ophthalmology who referred him to the ED for imaging to rule out stroke.  She also endorses somewhat of a headache on his way here, however feels "I just do not feel good" since he noticed he has no vision to his right eye.  Prior history of diabetes with paresthesias and neuropathy. ? ?Review of Systems  ?Positive: Vision changes ?Negative: Nausea, vomiting ? ?Physical Exam  ?BP 134/80   Pulse 70   Temp 98 ?F (36.7 ?C) (Oral)   Resp 16   SpO2 97%  ?Gen:   Awake, no distress   ?Resp:  Normal effort  ?MSK:   Moves extremities without difficulty  ?Other:   ? ?Medical Decision Making  ?Medically screening exam initiated at 4:49 PM.  Appropriate orders placed.  Djon T Brunty was informed that the remainder of the evaluation will be completed by another provider, this initial triage assessment does not replace that evaluation, and the importance of remaining in the ED until their evaluation is complete. ? ? ?  ?Janeece Fitting, PA-C ?11/15/21 1654 ? ?

## 2021-11-15 NOTE — ED Provider Notes (Signed)
?[facility] ?Provider Note ? ? ?CSN: [id number] ?Arrival date & time: 11/15/21  1628 ? ?  ? ?History ? ?Chief Complaint  ?Patient presents with  ? Loss of Vision  ? ? ?Gregory May is a 61 y.o. male. ? ?HPI ?Patient presents for painless loss of vision to his right eye.  He states that the onset was this morning when he woke up, although when thinking back, he does remember it being difficult to see the television last night.  Other than that, he does state that he was in his normal state of health last night.  He went to bed around midnight.  This morning when he woke up, he had complete graying out of his entire visual field from his right eye.  He was not able to see movement.  He was able to see faint light.  He went to his ophthalmologist and underwent an eye exam.  This included dilation and funduscopic exam.  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  Patient currently reports that he has had some return of vision to his eye.  He still sees complete blurriness but is unable to identify movement.  He denies any history of CVA.  Currently, he takes a daily baby aspirin.  He is not on any other antiplatelet medications and he does not take any anticoagulation.  He did previously take anticoagulation due to a provoked DVT in his left leg.  This was years ago.  His additional medical history includes T2DM, HLD, neuropathy, multiple amputations, and HTN. ?  ? ?Home Medications ?Prior to Admission medications   ?Medication Sig Start Date End Date Taking? Authorizing Provider  ?Accu-Chek FastClix Lancets MISC Use Accu Chek Fastclix lancets to check blood sugar three times daily. DX:E11.65 09/10/19   Elayne Snare, MD  ?acetaminophen (TYLENOL) 500 MG tablet Take 500-1,000 mg by mouth every 6 (six) hours as needed for headache (pain).    [provider]  ?aspirin EC 81 MG tablet Take 81 mg by mouth every morning. Swallow whole.     [provider]  ?diclofenac Sodium (VOLTAREN) 1 % GEL Apply 2 g topically 4 (four) times daily. ?Patient taking differently: Apply 2 g topically 4 (four) times daily as needed (knee pain). 04/20/20   Alexandria Lodge, MD  ?diphenhydrAMINE (BENADRYL) 25 MG tablet Take 25 mg by mouth at bedtime as needed (sinus drainage).    [provider]  ?empagliflozin (JARDIANCE) 25 MG TABS tablet Take 1 tablet (25 mg total) by mouth daily. 07/14/20   Alexandria Lodge, MD  ?fluticasone Asencion Islam) 50 MCG/ACT nasal spray Place 2 sprays into both nostrils at bedtime as needed (sinus drainage). 09/19/21   Timothy Lasso, MD  ?gabapentin (NEURONTIN) 300 MG capsule TAKE 1 CAPSULE FOUR TIMES DAILY AS NEEDED ?Patient taking differently: Take 300-600 mg by mouth See admin instructions. Take 2 capsules (600 mg) by mouth every morning and 1-2 capsules (300-600 mg) at night, may also take 1 capsule (300 mg) midday as needed for pain 12/17/20   Elayne Snare, MD  ?glucose blood (ACCU-CHEK GUIDE) test strip TEST BLOOD SUGAR THREE TIMES DAILY AS DIRECTED 10/21/20   Elayne Snare, MD  ?insulin aspart (NOVOLOG) 100 UNIT/ML injection Inject 15-20 Units into the skin See admin instructions. Inject 15 units subcutaneously daily after breakfast, inject 16-20 units after supper - evening dose is based on size of meal and carb count    [provider]  ?  insulin degludec (TRESIBA FLEXTOUCH) 100 UNIT/ML FlexTouch Pen Inject 10-12 Units into the skin at bedtime.    [provider]  ?Insulin Syringes, Disposable, U-100 0.5 ML MISC Use to inject insulin 05/16/21   Reather Littler, MD  ?metFORMIN (GLUCOPHAGE-XR) 750 MG 24 hr tablet TAKE 2 TABLETS EVERY DAY 10/20/21   Reather Littler, MD  ?Multiple Vitamin (MULTIVITAMIN WITH MINERALS) TABS tablet Take 1 tablet by mouth every morning. Centrum    [provider]  ?oxyCODONE-acetaminophen (PERCOCET/ROXICET) 5-325 MG tablet Take 1 tablet by mouth 4 (four) times daily as needed. 09/05/21    [provider]  ?pioglitazone (ACTOS) 30 MG tablet TAKE 1 TABLET EVERY DAY 10/20/21   Reather Littler, MD  ?pravastatin (PRAVACHOL) 80 MG tablet TAKE 1 TABLET EVERY DAY ?Patient taking differently: Take 80 mg by mouth every morning. 05/25/21   Reather Littler, MD  ?   ? ?Allergies    ?Patient has no known allergies.   ? ?Review of Systems   ?Review of Systems  ?Eyes:  Positive for visual disturbance.  ?All other systems reviewed and are negative. ? ?Physical Exam ?Updated Vital Signs ?BP 114/67   Pulse 66   Temp 98.3 ?F (36.8 ?C) (Oral)   Resp 19   Ht 6' (1.829 m)   Wt 94.8 kg   SpO2 92%   BMI 28.35 kg/m?  ?Physical Exam ?Vitals and nursing note reviewed.  ?Constitutional:   ?   General: He is not in acute distress. ?   Appearance: Normal appearance. He is well-developed and normal weight. He is not ill-appearing, toxic-appearing or diaphoretic.  ?HENT:  ?   Head: Normocephalic and atraumatic.  ?   Right Ear: External ear normal.  ?   Left Ear: External ear normal.  ?   Nose: Nose normal.  ?   Mouth/Throat:  ?   Mouth: Mucous membranes are moist.  ?   Pharynx: Oropharynx is clear.  ?Eyes:  ?   General: No scleral icterus.    ?   Right eye: No discharge.     ?   Left eye: No discharge.  ?   Extraocular Movements: Extraocular movements intact.  ?   Conjunctiva/sclera: Conjunctivae normal.  ?   Comments: Pupils dilated due to recent ophthalmology exam.  Able to see movement only from right eye.  ?Cardiovascular:  ?   Rate and Rhythm: Normal rate and regular rhythm.  ?   Heart sounds: No murmur heard. ?Pulmonary:  ?   Effort: Pulmonary effort is normal. No respiratory distress.  ?   Breath sounds: Normal breath sounds. No wheezing or rales.  ?Abdominal:  ?   Palpations: Abdomen is soft.  ?   Tenderness: There is no abdominal tenderness.  ?Musculoskeletal:     ?   General: No swelling.  ?   Cervical back: Normal range of motion and neck supple. No rigidity.  ?Skin: ?   General: Skin is warm and dry.  ?    Capillary Refill: Capillary refill takes less than 2 seconds.  ?Neurological:  ?   General: No focal deficit present.  ?   Mental Status: He is alert and oriented to person, place, and time.  ?   Cranial Nerves: No cranial nerve deficit.  ?   Sensory: No sensory deficit.  ?   Motor: No weakness.  ?   Coordination: Coordination normal.  ?Psychiatric:     ?   Mood and Affect: Mood normal.     ?  Behavior: Behavior normal.     ?   Thought Content: Thought content normal.     ?   Judgment: Judgment normal.  ? ? ?ED Results / Procedures / Treatments   ?Labs ?(all labs ordered are listed, but only abnormal results are displayed) ?Labs Reviewed  ?DIFFERENTIAL - Abnormal; Notable for the following components:  ?    Result Value  ? Monocytes Absolute 1.1 (*)   ? All other components within normal limits  ?COMPREHENSIVE METABOLIC PANEL - Abnormal; Notable for the following components:  ? Glucose, Bld 130 (*)   ? AST 44 (*)   ? ALT 47 (*)   ? All other components within normal limits  ?LIPID PANEL - Abnormal; Notable for the following components:  ? LDL Cholesterol 101 (*)   ? All other components within normal limits  ?PROTIME-INR  ?APTT  ?CBC  ?MAGNESIUM  ?C-REACTIVE PROTEIN  ?SEDIMENTATION RATE  ?BASIC METABOLIC PANEL  ?CBC  ? ? ?EKG ?EKG Interpretation ? ?Date/Time:  Wednesday November 15 2021 16:46:19 EDT ?Ventricular Rate:  68 ?PR Interval:  178 ?QRS Duration: 80 ?QT Interval:  404 ?QTC Calculation: 429 ?R Axis:   55 ?Text Interpretation: Normal sinus rhythm with sinus arrhythmia Possible Anterior infarct , age undetermined Abnormal ECG When compared with ECG of 23-Jun-2021 11:17, PREVIOUS ECG IS PRESENT Confirmed by Godfrey Pick [phone]) on 11/15/2021 9:22:41 PM ? ?Radiology ?CT ANGIO HEAD NECK W WO CM ? ?Result Date: 11/15/2021 ?CLINICAL DATA:  Central retinal artery occlusion. Right eye vision loss. EXAM: CT ANGIOGRAPHY HEAD AND NECK TECHNIQUE: Multidetector CT imaging of the head and neck was performed using the standard  protocol during bolus administration of intravenous contrast. Multiplanar CT image reconstructions and MIPs were obtained to evaluate the vascular anatomy. Carotid stenosis measurements (when applicable) are obtained [place]

## 2021-11-15 NOTE — Consult Note (Addendum)
Neurology Consultation ? ?Reason for Consult: vision loss in right eye ?Referring Physician:  Dr Alfonso Patten. Dixon, EDP ? ?CC: right eye painless vision loss ? ?History is obtained from: ? ?HPI: Gregory May is a 61 y.o. 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 ?Patient reports last known well was sometime yesterday when he thought his glasses were just dirty and he was seeing foggy from the right eye.  When he woke up this morning, his right eye vision was completely black but has since improved some. 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 ?He reports some improvement in the right eye vision loss but still sees foggy.  No pain.  No tingling numbness weakness.  No slurred speech.  No headaches.  No chest pain or shortness of breath ?Denies jaw claudication.  Denies frequent headaches but does report some headache since yesterday or earlier this morning mostly on the right side. ? ? ?LKW: Sometime yesterday before dinner-unclear exact time ?tpa given?: no, outside the window ?Premorbid modified Rankin scale (mRS): 1 ? ? ?ROS: Full ROS was performed and is negative except as noted in the HPI.  ? ?Past Medical History:  ?Diagnosis Date  ? 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 Cutaneous abscess of left foot   ? Deep vein thrombosis (DVT) (HCC)   ? Diabetes mellitus   ? type II  ? 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 Right heel  ? DJD (degenerative joint disease)   ? DVT (deep venous thrombosis) ([place]) 02/08/2014  ? Proximal provoked. Date of diagnosis February 08 2014 Duration of anticoagulation: 6 months. End date 08/12/2014.  Anticoagulant: Lovenox 120 units daily Switched to Eliquis on 05/25/2014     ? Ear drum  perforation, right 03/06/2019  ? Non-pressure chronic ulcer of right calf, limited to breakdown of skin ([place]) 04/17/2017  ? Pulmonary emboli ([place]) 02/08/2014  ? Date of diagnosis February 08 2014, on chest CTA Hospitalized for 3 days Had some symptoms of shortness of breath, and chest pain With intercurrent DVT of the left LE. Duration of anticoagulation: 8 months. End date 10/11/2014.  Anticoagulant: Lovenox 120 units daily Switched to Eliquis on 05/25/2014 per patient preference   ? Pulmonary embolism ([place])   ? Sebaceous cyst   ? on back of neck  ? ?Family History  ?Problem Relation Age of Onset  ? Breast cancer Mother   ? Cancer Mother   ?     small intestine  ? Liver cancer Mother   ? Diabetes Mother   ? Diabetes Father   ? Diabetes Brother   ? Hypertension Maternal Grandmother   ? Heart Problems Maternal Grandmother   ? Diabetes Paternal Grandmother   ? Diabetes Paternal Grandfather   ? Diabetes Brother   ? ?Social History:  ? reports that he has never smoked. He has never used smokeless tobacco. He reports that he does not currently use alcohol. He reports that he does not use drugs. ? ?Medications ?No current facility-administered medications for this encounter. ? ?Current Outpatient Medications:  ?  Accu-Chek FastClix Lancets MISC, Use Accu Chek Fastclix lancets to check blood sugar three times daily. DX:E11.65, Disp: 300 each, Rfl: 2 ?  acetaminophen (TYLENOL)  500 MG tablet, Take 500-1,000 mg by mouth every 6 (six) hours as needed for headache (pain)., Disp: , Rfl:  ?  aspirin EC 81 MG tablet, Take 81 mg by mouth every morning. Swallow whole., Disp: , Rfl:  ?  diclofenac Sodium (VOLTAREN) 1 % GEL, Apply 2 g topically 4 (four) times daily. (Patient taking differently: Apply 2 g topically 4 (four) times daily as needed (knee pain).), Disp: 2 g, Rfl: 1 ?  diphenhydrAMINE (BENADRYL) 25 MG tablet, Take 25 mg by mouth at bedtime as needed (sinus drainage)., Disp: , Rfl:  ?  empagliflozin (JARDIANCE) 25 MG TABS tablet,  Take 1 tablet (25 mg total) by mouth daily., Disp: 90 tablet, Rfl: 3 ?  fluticasone (FLONASE) 50 MCG/ACT nasal spray, Place 2 sprays into both nostrils at bedtime as needed (sinus drainage)., Disp: 9.9 mL, Rfl: 1 ?  gabapentin (NEURONTIN) 300 MG capsule, TAKE 1 CAPSULE FOUR TIMES DAILY AS NEEDED (Patient taking differently: Take 300-600 mg by mouth See admin instructions. Take 2 capsules (600 mg) by mouth every morning and 1-2 capsules (300-600 mg) at night, may also take 1 capsule (300 mg) midday as needed for pain), Disp: 360 capsule, Rfl: 2 ?  glucose blood (ACCU-CHEK GUIDE) test strip, TEST BLOOD SUGAR THREE TIMES DAILY AS DIRECTED, Disp: 300 strip, Rfl: 3 ?  insulin aspart (NOVOLOG) 100 UNIT/ML injection, Inject 15-20 Units into the skin See admin instructions. Inject 15 units subcutaneously daily after breakfast, inject 16-20 units after supper - evening dose is based on size of meal and carb count, Disp: , Rfl:  ?  insulin degludec (TRESIBA FLEXTOUCH) 100 UNIT/ML FlexTouch Pen, Inject 10-12 Units into the skin at bedtime., Disp: , Rfl:  ?  Insulin Syringes, Disposable, U-100 0.5 ML MISC, Use to inject insulin, Disp: 100 each, Rfl: 2 ?  metFORMIN (GLUCOPHAGE-XR) 750 MG 24 hr tablet, TAKE 2 TABLETS EVERY DAY, Disp: 180 tablet, Rfl: 2 ?  Multiple Vitamin (MULTIVITAMIN WITH MINERALS) TABS tablet, Take 1 tablet by mouth every morning. Centrum, Disp: , Rfl:  ?  oxyCODONE-acetaminophen (PERCOCET/ROXICET) 5-325 MG tablet, Take 1 tablet by mouth 4 (four) times daily as needed., Disp: , Rfl:  ?  pioglitazone (ACTOS) 30 MG tablet, TAKE 1 TABLET EVERY DAY, Disp: 90 tablet, Rfl: 1 ?  pravastatin (PRAVACHOL) 80 MG tablet, TAKE 1 TABLET EVERY DAY (Patient taking differently: Take 80 mg by mouth every morning.), Disp: 90 tablet, Rfl: 1 ? ? ?Exam: ?Current vital signs: ?BP (!) 163/95 (BP Location: Right Arm)   Pulse (!) 51   Temp 98.3 ?F (36.8 ?C) (Oral)   Resp 13   Ht 6' (1.829 m)   Wt 94.8 kg   SpO2 100%   BMI 28.35  kg/m?  ?Vital signs in last 24 hours: ?Temp:  [98 ?F (36.7 ?C)-98.3 ?F (36.8 ?C)] 98.3 ?F (36.8 ?C) (04/05 2019) ?Pulse Rate:  [51-70] 51 (04/05 2118) ?Resp:  [13-16] 13 (04/05 2118) ?BP: (103-163)/(68-95) 163/95 (04/05 2118) ?SpO2:  [93 %-100 %] 100 % (04/05 2118) ?Weight:  [94.8 kg] 94.8 kg (04/05 1653) ? ?GENERAL: Awake, alert in NAD ?HEENT: - Normocephalic and atraumatic, dry mm, no LN++, no Thyromegally, intact temporal pulses bilaterally. ?LUNGS - Clear to auscultation bilaterally with no wheezes ?CV - S1S2 RRR, no m/r/g, equal pulses bilaterally. ?ABDOMEN - Soft, nontender, nondistended with normoactive BS ?Ext: warm, well perfused, right index finger amputation, right BKA,, no edema ? ?NEURO:  ?Mental Status: AA&Ox3  ?Language: speech is nondysarthric.  Naming, repetition, fluency,  and comprehension intact. ?Cranial Nerves: Had dilated eye exam done at the ophthalmologist a few hours ago-pupils are dilated with minimal reactivity to light bilaterally EOMI, visual fields full, visual acuity diminished in the right eye to light perception, no facial asymmetry, facial sensation intact, hearing intact, tongue/uvula/soft palate midline, normal sternocleidomastoid and trapezius muscle strength. No evidence of tongue atrophy or fibrillations ?Motor: 5/5 without drift in all fours ?Tone: is normal and bulk is normal ?Sensation- Intact to light touch bilaterally ?Coordination: FTN intact bilaterally, no ataxia in BLE. ?Gait- deferred ? ?NIHSS-2 for visual ? ? ? ?Labs ?I have reviewed labs in epic and the results pertinent to this consultation are: ? ? ?CBC ?   ?Component Value Date/Time  ? WBC 10.1 11/15/2021 1703  ? RBC 4.97 11/15/2021 1703  ? HGB 15.6 11/15/2021 1703  ? HGB 16.2 07/15/2019 1617  ? HCT 48.3 11/15/2021 1703  ? HCT 47.4 07/15/2019 1617  ? PLT 300 11/15/2021 1703  ? PLT 383 07/15/2019 1617  ? MCV 97.2 11/15/2021 1703  ? MCV 91 07/15/2019 1617  ? MCH 31.4 11/15/2021 1703  ? MCHC 32.3 11/15/2021 1703  ?  RDW 14.6 11/15/2021 1703  ? RDW 13.0 07/15/2019 1617  ? LYMPHSABS 2.2 11/15/2021 1703  ? LYMPHSABS 2.2 07/15/2019 1617  ? MONOABS 1.1 (H) 11/15/2021 1703  ? EOSABS 0.1 11/15/2021 1703  ? EOSABS 0.1 07/15/2019 1617

## 2021-11-16 ENCOUNTER — Observation Stay (HOSPITAL_BASED_OUTPATIENT_CLINIC_OR_DEPARTMENT_OTHER): Payer: Medicare HMO

## 2021-11-16 ENCOUNTER — Observation Stay (HOSPITAL_COMMUNITY): Payer: Medicare HMO

## 2021-11-16 ENCOUNTER — Other Ambulatory Visit: Payer: Self-pay | Admitting: Physician Assistant

## 2021-11-16 DIAGNOSIS — H3411 Central retinal artery occlusion, right eye: Secondary | ICD-10-CM | POA: Diagnosis not present

## 2021-11-16 DIAGNOSIS — H3410 Central retinal artery occlusion, unspecified eye: Secondary | ICD-10-CM

## 2021-11-16 DIAGNOSIS — I639 Cerebral infarction, unspecified: Secondary | ICD-10-CM | POA: Diagnosis not present

## 2021-11-16 LAB — LIPID PANEL
Cholesterol: 160 mg/dL (ref 0–200)
HDL: 43 mg/dL (ref >40)
LDL Cholesterol: 101 mg/dL — ABNORMAL HIGH (ref 0–99)
Total CHOL/HDL Ratio: 3.7 RATIO
Triglycerides: 82 mg/dL (ref <150)
VLDL: 16 mg/dL (ref 0–40)

## 2021-11-16 LAB — BASIC METABOLIC PANEL
Anion gap: 8 (ref 5–15)
BUN: 18 mg/dL (ref 6–20)
CO2: 25 mmol/L (ref 22–32)
Calcium: 9.1 mg/dL (ref 8.9–10.3)
Chloride: 103 mmol/L (ref 98–111)
Creatinine, Ser: 0.86 mg/dL (ref 0.61–1.24)
GFR, Estimated: >60 mL/min (ref >60)
Glucose, Bld: 223 mg/dL — ABNORMAL HIGH (ref 70–99)
Potassium: 3.8 mmol/L (ref 3.5–5.1)
Sodium: 136 mmol/L (ref 135–145)

## 2021-11-16 LAB — CBC
HCT: 46.2 % (ref 39.0–52.0)
Hemoglobin: 15.3 g/dL (ref 13.0–17.0)
MCH: 31.7 pg (ref 26.0–34.0)
MCHC: 33.1 g/dL (ref 30.0–36.0)
MCV: 95.9 fL (ref 80.0–100.0)
Platelets: 257 10*3/uL (ref 150–400)
RBC: 4.82 MIL/uL (ref 4.22–5.81)
RDW: 14.6 % (ref 11.5–15.5)
WBC: 8.2 10*3/uL (ref 4.0–10.5)
nRBC: 0 % (ref 0.0–0.2)

## 2021-11-16 LAB — GLUCOSE, CAPILLARY: Glucose-Capillary: 118 mg/dL — ABNORMAL HIGH (ref 70–99)

## 2021-11-16 LAB — ECHOCARDIOGRAM COMPLETE BUBBLE STUDY
AR max vel: 2.23 cm2
AV Area VTI: 2.31 cm2
AV Area mean vel: 2.25 cm2
AV Mean grad: 6 mmHg
AV Peak grad: 11.2 mmHg
Ao pk vel: 1.68 m/s
Area-P 1/2: 2.96 cm2
S' Lateral: 2.9 cm

## 2021-11-16 LAB — CBG MONITORING, ED
Glucose-Capillary: 183 mg/dL — ABNORMAL HIGH (ref 70–99)
Glucose-Capillary: 218 mg/dL — ABNORMAL HIGH (ref 70–99)

## 2021-11-16 LAB — C-REACTIVE PROTEIN: CRP: <0.5 mg/dL (ref <1.0)

## 2021-11-16 LAB — HEMOGLOBIN A1C
Hgb A1c MFr Bld: 6.8 % — ABNORMAL HIGH (ref 4.8–5.6)
Mean Plasma Glucose: 148.46 mg/dL

## 2021-11-16 LAB — MAGNESIUM: Magnesium: 1.9 mg/dL (ref 1.7–2.4)

## 2021-11-16 LAB — SEDIMENTATION RATE: Sed Rate: 11 mm/hr (ref 0–16)

## 2021-11-16 MED ORDER — INSULIN ASPART 100 UNIT/ML IJ SOLN
0.0000 [IU] | Freq: Three times a day (TID) | INTRAMUSCULAR | Status: DC
  Administered 2021-11-16: 5 [IU] via SUBCUTANEOUS
  Administered 2021-11-17: 2 [IU] via SUBCUTANEOUS
  Administered 2021-11-17: 5 [IU] via SUBCUTANEOUS

## 2021-11-16 MED ORDER — ROSUVASTATIN CALCIUM 20 MG PO TABS
20.0000 mg | ORAL_TABLET | Freq: Every day | ORAL | Status: DC
Start: 2021-11-16 — End: 2021-11-17
  Administered 2021-11-16 – 2021-11-17 (×2): 20 mg via ORAL
  Filled 2021-11-16 (×2): qty 1

## 2021-11-16 MED ORDER — INSULIN GLARGINE-YFGN 100 UNIT/ML ~~LOC~~ SOLN
10.0000 [IU] | Freq: Every day | SUBCUTANEOUS | Status: DC
  Administered 2021-11-16 (×2): 10 [IU] via SUBCUTANEOUS
  Filled 2021-11-16 (×4): qty 0.1

## 2021-11-16 MED ORDER — POTASSIUM CHLORIDE 20 MEQ PO PACK
20.0000 meq | PACK | Freq: Once | ORAL | Status: AC
Start: 2021-11-16 — End: 2021-11-16
  Administered 2021-11-16: 20 meq via ORAL
  Filled 2021-11-16: qty 1

## 2021-11-16 MED ORDER — GABAPENTIN 300 MG PO CAPS
600.0000 mg | ORAL_CAPSULE | Freq: Two times a day (BID) | ORAL | Status: DC
  Administered 2021-11-16 – 2021-11-17 (×3): 600 mg via ORAL
  Filled 2021-11-16 (×3): qty 2

## 2021-11-16 MED ORDER — INSULIN ASPART 100 UNIT/ML IJ SOLN
10.0000 [IU] | Freq: Three times a day (TID) | INTRAMUSCULAR | Status: DC
  Administered 2021-11-16 – 2021-11-17 (×3): 10 [IU] via SUBCUTANEOUS

## 2021-11-16 MED ORDER — EMPAGLIFLOZIN 25 MG PO TABS
25.0000 mg | ORAL_TABLET | Freq: Every day | ORAL | Status: DC
  Administered 2021-11-16 – 2021-11-17 (×2): 25 mg via ORAL
  Filled 2021-11-16 (×2): qty 1

## 2021-11-16 MED ORDER — PIOGLITAZONE HCL 30 MG PO TABS
30.0000 mg | ORAL_TABLET | Freq: Every day | ORAL | Status: DC
  Administered 2021-11-17: 30 mg via ORAL
  Filled 2021-11-16 (×2): qty 1

## 2021-11-16 NOTE — Evaluation (Signed)
Occupational Therapy Evaluation ?Patient Details ?Name: Gregory May ?MRN: [id number] ?DOB: 01-25-1961 ?Today's Date: 11/16/2021 ? ? ?History of Present Illness Pt is a 61 y/o male admitted secondary to vision loss in R eye. MRI head negative. Found to have retinal artery occlusion. PMH includes a fib, HTN, CKD, DM, and glaucoma.  ? ?Clinical Impression ?  ?PTA patient independent.  Admitted for above and presents with mild unsteadiness, impaired vision in R eye.  He currently requires min guard for mobility and ADLs.  He reports visual deficits in R eye only when L eye is covered, difficulty with central vision mainly.  Educated on precautions with visual deficits, scanning to R side and following up with MD if anything changes.  Pt does not drive.  He will benefit from further OT services acutely to optimize independence and safety, but anticipate no further needs after dc home.  ?   ? ?Recommendations for follow up therapy are one component of a multi-disciplinary discharge planning process, led by the attending physician.  Recommendations may be updated based on patient status, additional functional criteria and insurance authorization.  ? ?Follow Up Recommendations ? No OT follow up  ?  ?Assistance Recommended at Discharge PRN  ?Patient can return home with the following Assist for transportation ? ?  ?Functional Status Assessment ? Patient has had a recent decline in their functional status and demonstrates the ability to make significant improvements in function in a reasonable and predictable amount of time.  ?Equipment Recommendations ? None recommended by OT  ?  ?Recommendations for Other Services   ? ? ?  ?Precautions / Restrictions Precautions ?Precautions: Fall;Other (comment) ?Precaution Comments: R BKA ?Required Braces or Orthoses: Other Brace ?Other Brace: R prosthetic ?Restrictions ?Weight Bearing Restrictions: No  ? ?  ? ?Mobility Bed Mobility ?Overal bed mobility: Modified Independent ?  ?  ?   ?  ?  ?  ?  ?  ? ?Transfers ?Overall transfer level: Needs assistance ?Equipment used: None ?Transfers: Sit to/from Stand ?Sit to Stand: Min guard ?  ?  ?  ?  ?  ?General transfer comment: Min guard for safety. ?  ? ?  ?Balance Overall balance assessment: Mild deficits observed, not formally tested ?  ?  ?  ?  ?  ?  ?  ?  ?  ?  ?  ?  ?  ?  ?  ?  ?  ?  ?   ? ?ADL either performed or assessed with clinical judgement  ? ?ADL Overall ADL's : Needs assistance/impaired ?  ?  ?Grooming: Min guard;Standing ?  ?  ?  ?  ?  ?Upper Body Dressing : Set up;Sitting ?  ?Lower Body Dressing: Min guard;Sit to/from stand ?  ?Toilet Transfer: Min guard;Ambulation ?  ?  ?  ?  ?  ?Functional mobility during ADLs: Min guard;Cueing for safety ?   ? ? ? ?Vision Baseline Vision/History: 1 Wears glasses ?Ability to See in Adequate Light: 0 Adequate ?Patient Visual Report: Other (comment) (decreased vision in R eye) ?Vision Assessment?: Yes ?Eye Alignment: Within Functional Limits ?Ocular Range of Motion: Within Functional Limits ?Alignment/Gaze Preference: Within Defined Limits ?Tracking/Visual Pursuits: Able to track stimulus in all quads without difficulty ?Visual Fields: Other (comment) (with L eye covered, decreased in R eye centrally) ?Additional Comments: pt reports with both eyes vision is normal, when he closes he L eye he has difficulty seeing in his central vision  ?   ?Perception   ?  ?  Praxis   ?  ? ?Pertinent Vitals/Pain Pain Assessment ?Pain Assessment: Faces ?Faces Pain Scale: Hurts even more ?Pain Location: phantom pain on RLE ?Pain Descriptors / Indicators: Guarding, Grimacing ?Pain Intervention(s): Limited activity within patient's tolerance, Monitored during session, Repositioned  ? ? ? ?Hand Dominance Right ?  ?Extremity/Trunk Assessment Upper Extremity Assessment ?Upper Extremity Assessment: Overall WFL for tasks assessed ?  ?Lower Extremity Assessment ?Lower Extremity Assessment: RLE deficits/detail ?RLE Deficits /  Details: R BKA at baseline. Reporting phantom limb pain ?  ?Cervical / Trunk Assessment ?Cervical / Trunk Assessment: Normal ?  ?Communication Communication ?Communication: No difficulties ?  ?Cognition Arousal/Alertness: Awake/alert ?Behavior During Therapy: [facility] for tasks assessed/performed ?Overall Cognitive Status: Within Functional Limits for tasks assessed ?  ?  ?  ?  ?  ?  ?  ?  ?  ?  ?  ?  ?  ?  ?  ?  ?  ?  ?  ?General Comments  discussed safety precautions with IADLs, does not drive but discussed cooking ? ?  ?Exercises   ?  ?Shoulder Instructions    ? ? ?Home Living Family/patient expects to be discharged to:: Private residence ?Living Arrangements: Other relatives (brother) ?Available Help at Discharge: Family;Available PRN/intermittently ?Type of Home: House ?Home Access: Stairs to enter ?Entrance Stairs-Number of Steps: 3 ?Entrance Stairs-Rails: Right ?Home Layout: One level ?  ?  ?Bathroom Shower/Tub: Tub/shower unit ?  ?Bathroom Toilet: Standard ?  ?  ?Home Equipment: Control and instrumentation engineer (2 wheels);Crutches ?  ?  ?  ? ?  ?Prior Functioning/Environment Prior Level of Function : Independent/Modified Independent ?  ?  ?  ?  ?  ?  ?  ?ADLs Comments: Family assists with driving to appointments ?  ? ?  ?  ?OT Problem List: Impaired vision/perception ?  ?   ?OT Treatment/Interventions:    ?  ?OT Goals(Current goals can be found in the care plan section) Acute Rehab OT Goals ?Patient Stated Goal: home ?OT Goal Formulation: With patient ?Time For Goal Achievement: 11/30/21 ?Potential to Achieve Goals: Good  ?OT Frequency:   ?  ? ?Co-evaluation PT/OT/SLP Co-Evaluation/Treatment: Yes ?Reason for Co-Treatment: For patient/therapist safety;To address functional/ADL transfers ?PT goals addressed during session: Mobility/safety with mobility ?OT goals addressed during session: ADL's and self-care ?  ? ?  ?AM-PAC OT "6 Clicks" Daily Activity     ?Outcome Measure Help from another person eating meals?:  None ?Help from another person taking care of personal grooming?: A Little ?Help from another person toileting, which includes using toliet, bedpan, or urinal?: A Little ?Help from another person bathing (including washing, rinsing, drying)?: A Little ?Help from another person to put on and taking off regular upper body clothing?: A Little ?Help from another person to put on and taking off regular lower body clothing?: A Little ?6 Click Score: 19 ?  ?End of Session Equipment Utilized During Treatment: Gait belt ?Nurse Communication: Mobility status ? ?Activity Tolerance: Patient tolerated treatment well ?Patient left: in bed;with call bell/phone within reach ? ?OT Visit Diagnosis: Low vision, both eyes (H54.2);Other abnormalities of gait and mobility (R26.89)  ?              ?Time: 9892-1194 ?OT Time Calculation (min): 25 min ?Charges:  OT General Charges ?$OT Visit: 1 Visit ?OT Evaluation ?$OT Eval Moderate Complexity: 1 Mod ? ?Barry Brunner, OT ?Acute Rehabilitation Services ?Pager [phone] ?Office [phone] ? ? ?Gregory May ?11/16/2021, 12:25 PM ?

## 2021-11-16 NOTE — Progress Notes (Signed)
? ? ?HD#0 ?SUBJECTIVE:  ?Patient Summary: Gregory May is a 61 y.o. 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 ?Overnight Events: No acute events ? ?  ?Interm History: Patient reports he is doing better.  Notes minimal improvement in his right eye.  No pain.  No headache imbalance or other neuro symptoms. ? ?OBJECTIVE:  ?Vital Signs: ?Vitals:  ? 11/16/21 1530 11/16/21 1600 11/16/21 1630 11/16/21 1700  ?BP: 111/81 134/71 (!) 129/92 136/82  ?Pulse: (!) 58 (!) 53 (!) 58 62  ?Resp: 13 19 11 14   ?Temp:      ?TempSrc:      ?SpO2: 98% 99% 97% 96%  ?Weight:      ?Height:      ? ?Supplemental O2:  ?SpO2: 96 % ? ?Filed Weights  ? 11/15/21 1653  ?Weight: 94.8 kg  ? ? ?No intake or output data in the 24 hours ending 11/16/21 1720 ?Net IO Since Admission: No IO data has been entered for this period [11/16/21 1720] ? ?Physical Exam: ?Physical Exam ?Constitutional:   ?   Appearance: Normal appearance. He is normal weight.  ?HENT:  ?   Head: Atraumatic.  ?Eyes:  ?   Extraocular Movements: Extraocular movements intact.  ?   Comments: A Farrand pupillary constriction when light shown in left eye.  Reflex absent with light shown in the right.  Eyes moves symmetrically, patient loses track of finger when crossing midline.  Does endorse some vision temporal region.  ?Cardiovascular:  ?   Rate and Rhythm: Normal rate and regular rhythm.  ?Pulmonary:  ?   Effort: Pulmonary effort is normal.  ?Abdominal:  ?   General: Abdomen is flat. Bowel sounds are normal.  ?   Palpations: Abdomen is soft.  ?Musculoskeletal:  ?   Cervical back: Normal range of motion.  ?   Comments: RLE amputation  ?Skin: ?   General: Skin is warm and dry.  ?Neurological:  ?   Mental Status: He is alert and oriented to person, place, and time.  ?Psychiatric:     ?   Mood and Affect: Mood normal.     ?   Behavior: Behavior normal.   ? ? ?Patient Lines/Drains/Airways Status   ? ? Active Line/Drains/Airways   ? ? Name Placement date Placement time Site Days  ? Peripheral IV 11/15/21 18 G Left Antecubital 11/15/21  1720  Antecubital  1  ? Incision (Closed) 07/21/14 Hip Left 07/21/14  1041  -- 2675  ? Incision (Closed) 06/03/15 Leg Right 06/03/15  1517  -- 2358  ? Incision (Closed) 07/24/19 Leg Left 07/24/19  1124  -- 846  ? Incision (Closed) 06/23/21 Leg Left 06/23/21  1343  -- 146  ? Incision (Closed) 08/21/21 Hand Right 08/21/21  1458  -- 87  ? ?  ?  ? ?  ? ? ?Pertinent Labs: ? ?  Latest Ref Rng & Units 11/16/2021  ?  3:40 AM 11/15/2021  ?  5:03 PM 08/22/2021  ?  2:24 AM  ?CBC  ?WBC 4.0 - 10.5 K/uL 8.2   10.1   10.5    ?Hemoglobin 13.0 - 17.0 g/dL 91.415.3   78.215.6   95.615.5    ?Hematocrit 39.0 - 52.0 % 46.2   48.3   46.1    ?Platelets 150 - 400 K/uL 257   300   357    ? ? ? ?  Latest Ref Rng & Units 11/16/2021  ?  3:40 AM 11/15/2021  ?  5:03 PM 08/31/2021  ?  2:54 PM  ?CMP  ?Glucose 70 - 99 mg/dL 867   619   69    ?BUN 6 - 20 mg/dL 18   20   13     ?Creatinine 0.61 - 1.24 mg/dL   5.09   3.26    ?Sodium 135 - 145 mmol/L 136   137   139    ?Potassium 3.5 - 5.1 mmol/L 3.8   4.2   4.4    ?Chloride 98 - 111 mmol/L 103   104   102    ?CO2 22 - 32 mmol/L 25   24   28     ?Calcium 8.9 - 10.3 mg/dL 9.1   9.8   9.4    ?Total Protein 6.5 - 8.1 g/dL  6.8   7.1    ?Total Bilirubin 0.3 - 1.2 mg/dL  0.5   0.3    ?Alkaline Phos 38 - 126 U/L  49   58    ?AST 15 - 41 U/L  44   45    ?ALT 0 - 44 U/L  47   49    ? ? ?Recent Labs  ?  11/16/21 ?1033 11/16/21 ?1427  ?GLUCAP 183* 218*  ?  ? ?Pertinent Imaging: ?CT ANGIO HEAD NECK W WO CM ? ?Result Date: 11/15/2021 ?CLINICAL DATA:  Central retinal artery occlusion. Right eye vision loss. EXAM: CT ANGIOGRAPHY HEAD AND NECK TECHNIQUE: Multidetector CT imaging of the head and neck was performed using the standard protocol during bolus administration of intravenous contrast. Multiplanar CT image reconstructions and MIPs were obtained to  evaluate the vascular anatomy. Carotid stenosis measurements (when applicable) are obtained utilizing NASCET criteria, using the distal internal carotid diameter as the denominator. RADIATION DOSE REDUCTION: This exam was performed according to the departmental dose-optimization program which includes automated exposure control, adjustment of the mA and/or kV according to patient size and/or use of iterative reconstruction technique. CONTRAST:  76mL OMNIPAQUE IOHEXOL 350 MG/ML SOLN COMPARISON:  None. FINDINGS: CTA NECK FINDINGS SKELETON: There is no bony spinal canal stenosis. No lytic or blastic lesion. OTHER NECK: Normal pharynx, larynx and major salivary glands. No cervical lymphadenopathy. Unremarkable thyroid gland. UPPER CHEST: No pneumothorax or pleural effusion. No nodules or masses. AORTIC ARCH: There is calcific atherosclerosis of the aortic arch. There is no aneurysm, dissection or hemodynamically significant stenosis of the visualized portion of the aorta. Conventional 3 vessel aortic branching pattern. The visualized proximal subclavian arteries are widely patent. RIGHT CAROTID SYSTEM: No dissection, occlusion or aneurysm. Mild atherosclerotic calcification at the carotid bifurcation without hemodynamically significant stenosis. LEFT CAROTID SYSTEM: No dissection, occlusion or aneurysm. Mild atherosclerotic calcification at the carotid bifurcation without hemodynamically significant stenosis. VERTEBRAL ARTERIES: Left dominant configuration. Both origins are clearly patent. There is no dissection, occlusion or flow-limiting stenosis to the skull base (V1-V3 segments). CTA HEAD FINDINGS POSTERIOR CIRCULATION: --Vertebral arteries: Normal V4 segments. --Inferior cerebellar arteries: Normal. --Basilar artery: Normal. --Superior cerebellar arteries: Normal. --Posterior cerebral arteries (PCA): Normal. ANTERIOR CIRCULATION: --Intracranial internal carotid arteries: Normal. --Anterior cerebral arteries (ACA):  Normal. Both A1 segments are present. Patent anterior communicating artery (a-comm). --Middle cerebral arteries (MCA): Normal. VENOUS SINUSES: As permitted by contrast timing, patent. ANATOMIC VARIANTS: None Review of the MIP images confirms the above findings. IMPRESSION: 1. No emergent large vessel occlusion or high-grade stenosis of the intracranial or cervical arteries. Aortic Atherosclerosis (ICD10-I70.0).  Electronically Signed   By: Deatra Robinson M.D.   On: 11/15/2021 23:04  ? ?CT HEAD WO CONTRAST ( ) ? ?Result Date: 11/15/2021 ?CLINICAL DATA:  Blurry vision right eye, headaches EXAM: CT HEAD WITHOUT CONTRAST TECHNIQUE: Contiguous axial images were obtained from the base of the skull through the vertex without intravenous contrast. RADIATION DOSE REDUCTION: This exam was performed according to the departmental dose-optimization program which includes automated exposure control, adjustment of the mA and/or kV according to patient size and/or use of iterative reconstruction technique. COMPARISON:  None. FINDINGS: Brain: No acute intracranial findings are seen in noncontrast CT brain. Cortical sulci are prominent. There is decreased density in the periventricular white matter. There is possible old lacunar infarct in the right frontal lobe adjacent to the frontal horn. There is linear focus of encephalomalacia in the posterior left cerebellum suggesting old infarct. Vascular: Unremarkable. Skull: Unremarkable. Sinuses/Orbits: There is mild mucosal thickening in the ethmoid sinus. Optic globes are symmetrical. Retrobulbar soft tissues are unremarkable. Other: None IMPRESSION: No acute intracranial findings are seen in noncontrast CT brain. Atrophy. 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 Chronic ethmoid sinusitis. No focal abnormality is seen in the orbits. Electronically Signed   By: Ernie Avena M.D.   On: 11/15/2021 19:18  ? ?ECHOCARDIOGRAM COMPLETE BUBBLE  STUDY ? ?Result Date: 11/16/2021 ?   ECHOCARDIOGRAM REPORT   Patient Name:   VIHAAN GLOSS Date of Exam: 11/16/2021 Medical Rec #:  [id number]           Height:       72.0 in Accession #:    [id number]          Weight:

## 2021-11-16 NOTE — Progress Notes (Addendum)
STROKE TEAM PROGRESS NOTE  ? ?HISTORY OF PRESENT ILLNESS (per record) ?Gregory May is a 61 y.o. 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 ?Patient reports last known well was sometime yesterday when he thought his glasses were just dirty and he was seeing foggy from the right eye.  When he woke up this morning, his right eye vision was completely black but has since improved some. 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 ?He reports some improvement in the right eye vision loss but still sees foggy.  No pain.  No tingling numbness weakness.  No slurred speech.  No headaches.  No chest pain or shortness of breath ?Denies jaw claudication.  Denies frequent headaches but does report some headache since yesterday or earlier this morning mostly on the right side. ?LKW: Sometime yesterday before dinner-unclear exact time ?tpa given?: no, outside the window ?Premorbid modified Rankin scale (mRS): 1 ? ? ?INTERVAL HISTORY ?His therapists are at the bedside.  Patient states his right-sided vision remains poor except the outer and upper rim where he can see a bit.  He denies any other focal strokelike symptoms.  CT angiogram of the brain and neck blood vessels do not show any significant stenosis.  ESR is normal at 11 mm.  Hemoglobin A1c 6.8 and LDL cholesterol is 101 mg percent.  Echocardiogram shows ejection fraction 60 to 65% without wall motion abnormalities.  MRI scan is pending ? ? ? ?OBJECTIVE ?Vitals:  ? 11/16/21 1000 11/16/21 1030 11/16/21 1100 11/16/21 1200  ?BP: 138/77 (!) 130/97 125/68 131/87  ?Pulse: 67 66 (!) 58 (!) 56  ?Resp: 15 16 20 20   ?Temp:      ?TempSrc:      ?SpO2: 96% 97% 94% 94%   ?Weight:      ?Height:      ? ? ?CBC:  ?Recent Labs  ?Lab 11/15/21 ?1703 11/16/21 ?0340  ?WBC 10.1 8.2  ?NEUTROABS 6.7  --   ?HGB 15.6 15.3  ?HCT 48.3 46.2  ?MCV 97.2 95.9  ?PLT 300 257  ? ? ?Basic Metabolic Panel:  ?Recent Labs  ?Lab 11/15/21 ?1703 11/15/21 ?1746 11/16/21 ?0340  ?NA 137  --  136  ?K 4.2  --  3.8  ?CL 104  --  103  ?CO2 24  --  25  ?GLUCOSE 130*  --  223*  ?BUN 20  --  18  ?CREATININE 0.83  --  0.86  ?CALCIUM 9.8  --  9.1  ?MG  --  1.9  --   ? ? ?Lipid Panel:  ?   ?Component Value Date/Time  ? CHOL 160 11/16/2021 0036  ? TRIG 82 11/16/2021 0036  ? HDL 43 11/16/2021 0036  ? CHOLHDL 3.7 11/16/2021 0036  ? VLDL 16 11/16/2021 0036  ? [place] 101 (H) 11/16/2021 0036  ? ?HgbA1c:  ?Lab Results  ?Component Value Date  ? HGBA1C 6.8 (H) 11/16/2021  ? ?Urine Drug Screen:  ?   ?Component Value Date/Time  ? LABOPIA POSITIVE (A) 06/01/2015 2348  ? COCAINSCRNUR POSITIVE (A) 06/01/2015 2348  ? LABBENZ NONE DETECTED 06/01/2015 2348  ? AMPHETMU NONE DETECTED 06/01/2015 2348  ? THCU NONE DETECTED 06/01/2015 2348  ? LABBARB NONE DETECTED 06/01/2015 2348  ?  ?  Alcohol Level  ?   ?Component Value Date/Time  ? ETH <10 06/02/2020 0244  ? ? ?IMAGING ? ? ?CT ANGIO HEAD NECK W WO CM ? ?Result Date: 11/15/2021 ?CLINICAL DATA:  Central retinal artery occlusion. Right eye vision loss. EXAM: CT ANGIOGRAPHY HEAD AND NECK TECHNIQUE: Multidetector CT imaging of the head and neck was performed using the standard protocol during bolus administration of intravenous contrast. Multiplanar CT image reconstructions and MIPs were obtained to evaluate the vascular anatomy. Carotid stenosis measurements (when applicable) are obtained utilizing NASCET criteria, using the distal internal carotid diameter as the denominator. RADIATION DOSE REDUCTION: This exam was performed according to the departmental dose-optimization program which includes automated exposure control, adjustment of the mA and/or kV according to patient size and/or use of  iterative reconstruction technique. CONTRAST:  59m OMNIPAQUE IOHEXOL 350 MG/ML SOLN COMPARISON:  None. FINDINGS: CTA NECK FINDINGS SKELETON: There is no bony spinal canal stenosis. No lytic or blastic lesion. OTHER NECK: Normal pharynx, larynx and major salivary glands. No cervical lymphadenopathy. Unremarkable thyroid gland. UPPER CHEST: No pneumothorax or pleural effusion. No nodules or masses. AORTIC ARCH: There is calcific atherosclerosis of the aortic arch. There is no aneurysm, dissection or hemodynamically significant stenosis of the visualized portion of the aorta. Conventional 3 vessel aortic branching pattern. The visualized proximal subclavian arteries are widely patent. RIGHT CAROTID SYSTEM: No dissection, occlusion or aneurysm. Mild atherosclerotic calcification at the carotid bifurcation without hemodynamically significant stenosis. LEFT CAROTID SYSTEM: No dissection, occlusion or aneurysm. Mild atherosclerotic calcification at the carotid bifurcation without hemodynamically significant stenosis. VERTEBRAL ARTERIES: Left dominant configuration. Both origins are clearly patent. There is no dissection, occlusion or flow-limiting stenosis to the skull base (V1-V3 segments). CTA HEAD FINDINGS POSTERIOR CIRCULATION: --Vertebral arteries: Normal V4 segments. --Inferior cerebellar arteries: Normal. --Basilar artery: Normal. --Superior cerebellar arteries: Normal. --Posterior cerebral arteries (PCA): Normal. ANTERIOR CIRCULATION: --Intracranial internal carotid arteries: Normal. --Anterior cerebral arteries (ACA): Normal. Both A1 segments are present. Patent anterior communicating artery (a-comm). --Middle cerebral arteries (MCA): Normal. VENOUS SINUSES: As permitted by contrast timing, patent. ANATOMIC VARIANTS: None Review of the MIP images confirms the above findings. IMPRESSION: 1. No emergent large vessel occlusion or high-grade stenosis of the intracranial or cervical arteries. Aortic Atherosclerosis  (ICD10-I70.0). Electronically Signed   By: KUlyses JarredM.D.   On: 11/15/2021 23:04  ? ?CT HEAD WO CONTRAST (5MM) ? ?Result Date: 11/15/2021 ?CLINICAL DATA:  Blurry vision right eye, headaches EXAM: CT HEAD WITHOUT CONTRAST TECHNIQUE: Contiguous axial images were obtained from the base of the skull through the vertex without intravenous contrast. RADIATION DOSE REDUCTION: This exam was performed according to the departmental dose-optimization program which includes automated exposure control, adjustment of the mA and/or kV according to patient size and/or use of iterative reconstruction technique. COMPARISON:  None. FINDINGS: Brain: No acute intracranial findings are seen in noncontrast CT brain. Cortical sulci are prominent. There is decreased density in the periventricular white matter. There is possible old lacunar infarct in the right frontal lobe adjacent to the frontal horn. There is linear focus of encephalomalacia in the posterior left cerebellum suggesting old infarct. Vascular: Unremarkable. Skull: Unremarkable. Sinuses/Orbits: There is mild mucosal thickening in the ethmoid sinus. Optic globes are symmetrical. Retrobulbar soft tissues are unremarkable. Other: None IMPRESSION: No acute intracranial findings are seen in noncontrast CT brain. Atrophy. 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 Chronic ethmoid sinusitis. No focal abnormality is seen in the orbits. Electronically Signed   By:  Elmer Picker M.D.   On: 11/15/2021 19:18  ? ?ECHOCARDIOGRAM COMPLETE BUBBLE STUDY ? ?Result Date: 11/16/2021 ?   ECHOCARDIOGRAM REPORT   Patient Name:   Gregory May Date of Exam: 11/16/2021 Medical Rec #:  [id number]           Height:       72.0 in Accession #:    [id number]          Weight:       209.0 lb Date of Birth:  1961/01/11           BSA:          2.171 m? Patient Age:    52 years            BP:           121/77 mmHg Patient Gender: M                   HR:           66  bpm. Exam Location:  Inpatient Procedure: 2D Echo and Saline Contrast Bubble Study Indications:    Retinal artery occlusion  History:        Patient has no prior history of Echocardiogram examinations.

## 2021-11-16 NOTE — Discharge Summary (Addendum)
? ?Name: Gregory May ?MRN: [id number] ?DOB: May 21, 1961 61 y.o. ?PCP: Gregory Lasso, MD ? ?Date of Admission: 11/15/2021  4:38 PM ?Date of Discharge: 11/17/21 ?Attending Physician: Dr.  Saverio Danker ? ?Discharge Diagnosis: ?Principal Problem: ?  Retinal artery occlusion, central ?  ? ?Discharge Medications: ?Allergies as of 11/17/2021   ?No Known Allergies ?  ? ?  ?Medication List  ?  ? ?STOP taking these medications   ? ?aspirin EC 81 MG tablet ?  ?diclofenac Sodium 1 % Gel ?Commonly known as: Voltaren ?  ?diphenhydrAMINE 25 MG tablet ?Commonly known as: BENADRYL ?  ?OVER THE COUNTER MEDICATION ?  ?oxyCODONE-acetaminophen 5-325 MG tablet ?Commonly known as: PERCOCET/ROXICET ?  ?pravastatin 80 MG tablet ?Commonly known as: PRAVACHOL ?  ? ?  ? ?TAKE these medications   ? ?Accu-Chek FastClix Lancets Misc ?Use Accu Chek Fastclix lancets to check blood sugar three times daily. DX:E11.65 ?  ?Accu-Chek Guide test strip ?Generic drug: glucose blood ?TEST BLOOD SUGAR THREE TIMES DAILY AS DIRECTED ?  ?acetaminophen 500 MG tablet ?Commonly known as: TYLENOL ?Take 1,000 mg by mouth at bedtime as needed for mild pain (pain). ?  ?apixaban 5 MG Tabs tablet ?Commonly known as: ELIQUIS ?Take 1 tablet (5 mg total) by mouth 2 (two) times daily. ?  ?empagliflozin 25 MG Tabs tablet ?Commonly known as: Jardiance ?Take 1 tablet (25 mg total) by mouth daily. ?  ?fluticasone 50 MCG/ACT nasal spray ?Commonly known as: FLONASE ?Place 2 sprays into both nostrils at bedtime as needed (sinus drainage). ?  ?gabapentin 300 MG capsule ?Commonly known as: NEURONTIN ?TAKE 1 CAPSULE FOUR TIMES DAILY AS NEEDED ?What changed: See the new instructions. ?  ?insulin aspart 100 UNIT/ML injection ?Commonly known as: novoLOG ?Inject 16-20 Units into the skin in the morning and at bedtime. ?  ?Insulin Syringes (Disposable) U-100 0.5 ML Misc ?Use to inject insulin ?  ?metFORMIN 750 MG 24 hr tablet ?Commonly known as: GLUCOPHAGE-XR ?TAKE 2 TABLETS EVERY DAY ?What  changed:  ?how much to take ?when to take this ?  ?multivitamin with minerals Tabs tablet ?Take 1 tablet by mouth every morning. Centrum ?  ?pioglitazone 30 MG tablet ?Commonly known as: ACTOS ?TAKE 1 TABLET EVERY DAY ?What changed: when to take this ?  ?rosuvastatin 20 MG tablet ?Commonly known as: CRESTOR ?Take 1 tablet (20 mg total) by mouth daily. ?Start taking on: November 18, 2021 ?  ?Tyler Aas FlexTouch 100 UNIT/ML FlexTouch Pen ?Generic drug: insulin degludec ?Inject 6 Units into the skin at bedtime. ?  ? ?  ? ? ?Disposition and follow-up:   ?[name] was discharged from [facility] in Stable condition.  At the hospital follow up visit please address: ? ?1.  Follow-up: ? a. Central retinal artery occlusion/MCA stroke - DVT bilaterally, started on eliquis. Zio patch to be placed on discharge given likely embolic etiology. Will need to follow up with [facility] 2 months. ?  ? b. PFO - outpatient TEE by structural heart. ? ? c. Diabetes ? ? d. HLD - LDL goal <70 ? ?2.  Labs / imaging needed at time of follow-up: cbg, TEE with structural heart ? ?3.  Pending labs/ test needing follow-up: none ? ?4.  Medication Changes ? Started: eliquis ? Stopped: asa ? ? ?Follow-up Appointments: ? Follow-up Information   ? ? [facility] Follow up.   ?Why: Call to see if qualify for assistance with transportation. ?Also call your [employer] number on the back of card  to see if qualfy for transportation ?Contact information: ?[phone] ? ?  ?  ? ? Gregory Lasso, MD Follow up in 1 week(s).   ?Specialty: Internal Medicine ?Contact information: ?[address] ?[place] [address] ?[phone] ? ? ?  ?  ? ?  ?  ? ?  ?Structural Cardiology 4/19 ?[place] ?Outpatient stroke clinic 2 months ? ?Hospital Course by problem list:  ?Patient presented to the emergency department with complaints of right-sided vision loss.  He had been referred over from his ophthalmologist with concern for central retinal artery  occlusion.  Neurology evaluated the patient, CT head and CT angio obtained did not show any acute changes nor any significant stenosis.  He underwent MRI which showed acute infarct, thought to be embolic in origin.  TTE showed PFO, and lower extremity Dopplers revealed bilateral DVTs.  Neurology recommended he be started on Eliquis for outpatient anticoagulation, also advised outpatient follow-up with structural heart for TEE and possible PFO closure. ? ? ?Discharge Subjective: ?Patient seen and evaluated bedside.  States he is feeling better.  Notices some improvement in his vision but still having trouble.  No other complaints at this time. ? ?Discharge Exam:   ?BP (!) 156/81 (BP Location: Left Arm)   Pulse 67   Temp 98.4 ?F (36.9 ?C) (Oral)   Resp 15   Ht 6' (1.829 m)   Wt 86.2 kg   SpO2 94%   BMI 25.77 kg/m?  ?Constitutional:   ?   Appearance: Normal appearance. He is normal weight.  ?HENT:  ?   Head: Atraumatic.  ?Eyes:  ?   Extraocular Movements: Extraocular movements intact.  ?   Comments: Afferent pupillary constriction when light shown in left eye.  Reflex absent with light shown in the right.  Eyes moves symmetrically, patient loses track of finger when crossing midline.  Does endorse some vision temporal region.  ?Cardiovascular:  ?   Rate and Rhythm: Normal rate and regular rhythm.  ?Pulmonary:  ?   Effort: Pulmonary effort is normal.  ?Abdominal:  ?   General: Abdomen is flat. Bowel sounds are normal.  ?   Palpations: Abdomen is soft.  ?Musculoskeletal:  ?   Cervical back: Normal range of motion.  ?   Comments: RLE amputation  ?Skin: ?   General: Skin is warm and dry.  ?Neurological:  ?   Mental Status: He is alert and oriented to person, place, and time.  ?Psychiatric:     ?   Mood and Affect: Mood normal.     ?   Behavior: Behavior normal. ? ?Pertinent Labs, Studies, and Procedures:  ? ?  Latest Ref Rng & Units 11/16/2021  ?  3:40 AM 11/15/2021  ?  5:03 PM 08/22/2021  ?  2:24 AM  ?CBC  ?WBC 4.0 -  10.5 K/uL 8.2   10.1   10.5    ?Hemoglobin 13.0 - 17.0 g/dL 15.3   15.6   15.5    ?Hematocrit 39.0 - 52.0 % 46.2   48.3   46.1    ?Platelets 150 - 400 K/uL 257   300   357    ? ? ? ?  Latest Ref Rng & Units 11/16/2021  ?  3:40 AM 11/15/2021  ?  5:03 PM 08/31/2021  ?  2:54 PM  ?CMP  ?Glucose 70 - 99 mg/dL 223   130   69    ?BUN 6 - 20 mg/dL 18   20   13     ?  Creatinine 0.61 - 1.24 mg/dL 0.86   0.83   1.04    ?Sodium 135 - 145 mmol/L 136   137   139    ?Potassium 3.5 - 5.1 mmol/L 3.8   4.2   4.4    ?Chloride 98 - 111 mmol/L 103   104   102    ?CO2 22 - 32 mmol/L 25   24   28     ?Calcium 8.9 - 10.3 mg/dL 9.1   9.8   9.4    ?Total Protein 6.5 - 8.1 g/dL  6.8   7.1    ?Total Bilirubin 0.3 - 1.2 mg/dL  0.5   0.3    ?Alkaline Phos 38 - 126 U/L  49   58    ?AST 15 - 41 U/L  44   45    ?ALT 0 - 44 U/L  47   49    ? ? ?CT ANGIO HEAD NECK W WO CM ? ?Result Date: 11/15/2021 ?CLINICAL DATA:  Central retinal artery occlusion. Right eye vision loss. EXAM: CT ANGIOGRAPHY HEAD AND NECK TECHNIQUE: Multidetector CT imaging of the head and neck was performed using the standard protocol during bolus administration of intravenous contrast. Multiplanar CT image reconstructions and MIPs were obtained to evaluate the vascular anatomy. Carotid stenosis measurements (when applicable) are obtained utilizing NASCET criteria, using the distal internal carotid diameter as the denominator. RADIATION DOSE REDUCTION: This exam was performed according to the departmental dose-optimization program which includes automated exposure control, adjustment of the mA and/or kV according to patient size and/or use of iterative reconstruction technique. CONTRAST:  77mL OMNIPAQUE IOHEXOL 350 MG/ML SOLN COMPARISON:  None. FINDINGS: CTA NECK FINDINGS SKELETON: There is no bony spinal canal stenosis. No lytic or blastic lesion. OTHER NECK: Normal pharynx, larynx and major salivary glands. No cervical lymphadenopathy. Unremarkable thyroid gland. UPPER CHEST: No  pneumothorax or pleural effusion. No nodules or masses. AORTIC ARCH: There is calcific atherosclerosis of the aortic arch. There is no aneurysm, dissection or hemodynamically significant stenosis of the visualized portion of th

## 2021-11-16 NOTE — Progress Notes (Signed)
Echocardiogram ?2D Echocardiogram has been performed. ? ?Gregory May ?11/16/2021, 8:48 AM ?

## 2021-11-16 NOTE — H&P (Signed)
? ? ? ?Date: 11/16/2021     ?     ?     ?Patient Name:  Gregory May MRN: [id number]  ?DOB: 10-05-1960 Age / Sex: 61 y.o., male   ?PCP: Timothy Lasso, MD    ?     ?Medical Service: Internal Medicine Teaching Service    ?     ?Attending Physician: Dr. Charise Killian, MD    ?First Contact: Dr. Christiana Fuchs, DO Pager: [phone]  ?Second Contact: Dr. Sanjuana Letters, DO Pager: [phone]  ?     ?After Hours (After 5p/  First Contact Pager: [phone]  ?weekends / holidays): Second Contact Pager: [phone]  ? ?Chief Complaint: Acute vision loss  ? ?History of Present Illness:  ?Gregory May is a 61 y.o. 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  ? ?He presents for stroke rule out after being evaluated by Dr. Gillian Scarce of ophthalmology  for acute R eye vision loss.. Patient states that he was in his usual state of health the night before admission when he noticed that his vision became blurry. He felt like there was a fog over his vision or that his eyewear might need cleaning. He had no redness or pain with the onset of this blurry vision. He also denied headache or any other neurological deficits. Patient went to sleep and when he awoke he noticed that he had the same issue. He covered his left eye and noted that he could not see out of his right eye. He subsequently called his ophthalmologist. He was evaluated in their office and he was diagnosed with central retinal artery occlusion. He was sent to the ED for further work up.  ? ?He denies numbness, tingling, weakness, chest pain, shortness of breath, N/V, jaw pain, previous similar episodes. ? ?Feels like he has had slight improvement in vision since presenting to the hospital.  ? ?Meds:   ?Novolog 15u w/ breakfast, 16-20u with lunch and dinner, tresiba 6 u (prescribed 10-12u but only using 6u to attempt to stretch it out due to insurance issues), metformin 1584m daily, pioglitazone 33mqd, jardiance 2519md ?Gabapentin  ?Pravastatin ? ?Allergies: ?Allergies as of 11/15/2021  ? (No Known Allergies)  ? ?Past Medical History:  ?Diagnosis Date  ? 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 Cutaneous abscess of left foot   ? Deep vein thrombosis (DVT) (HCC)   ? Diabetes mellitus   ? type II  ? Di[place] of [place] ? Right heel  ? DJD (degenerative joint disease)   ? D[place]) [place]  ? Proximal provoked. Date of diagnosis February 08 2014 Duration of anticoagulation: 6 months. End date 08/12/2014.  Anticoagulant: Lovenox 120 units daily Switched to Eliquis on 05/25/2014     ? Ear drum perforation, right 03/06/2019  ? Non-pressure chronic ulcer of right calf, limited to breakdown of skin ([place]12/[place]  ? Pulmonary emboli ([place]/29/[place]  ? Date of diagnosis February 08 2014, on chest CTA Hospitalized for 3 days Had some symptoms of shortness of breath, and chest pain With intercurrent DVT of the left LE. Duration of anticoagulation: 8 months. End date 10/11/2014.  Anticoagulant: Lovenox  120 units daily Switched to Eliquis on 05/25/2014 per patient preference   ? Pulmonary embolism ([place])   ? Sebaceous cyst   ? on back of neck  ? ? ?Family History:  ?Family History  ?Problem Relation Age of Onset  ? Breast cancer Mother   ? Cancer Mother   ?     small intestine  ? Liver cancer Mother   ? Diabetes Mother   ? Diabetes Father   ? Diabetes Brother   ? Hypertension Maternal Grandmother   ? Heart Problems Maternal Grandmother   ? Diabetes Paternal Grandmother   ? Diabetes Paternal Grandfather   ? Diabetes Brother   ? ? ? ?Social History:  ?Social History  ? ?Tobacco Use  ? Smoking status: Never  ? Smokeless tobacco: Never  ?Vaping Use  ? Vaping Use: Never used  ?Substance Use Topics  ? Alcohol  use: Not Currently  ?  Comment: beer and mixed drink maybe 5  times a month  ? 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  ? ? ?Review of Systems: ?A complete ROS was negative except as per HPI.  ? ?Physical Exam: ?Blood pressure 132/83, pulse (!) 56, temperature 98.3 ?F (36.8 ?C), temperature source Oral, resp. rate 16, height 6' (1.829 m), weight 94.8 kg, SpO2 97 %. ?Constitutional: Well-developed, well-nourished, and in no distress.  ?HENT:  ?Head: Normocephalic and atraumatic.  ?Eyes: EOM are normal.  ?Neck: Normal range of motion.  ?Cardiovascular: Bradycardic, regular rhythm, intact distal pulses. No gallop and no friction rub.  ?No murmur heard. No lower extremity edema  ?Pulmonary: Non labored breathing on room air, no wheezing or rales  ?Abdominal: Soft. Normal bowel sounds. Non distended and non tender ?Musculoskeletal: Normal range of motion. S/p R transtibial BKA, s/p R index finger amputation through middle phalanx, well healed, no erythema  ?   General: No tenderness or edema.  ?Neurological: Alert and oriented to person, place, and time. Decreased visual acuity of R eye, L eye without visual deficits. No other focal neurological deficits ?Skin: Skin is warm and dry.  ? ? ?EKG: personally reviewed my interpretation is NSR w/ occasional PAC ? ? ?Assessment & Plan by Problem: ?Principal Problem: ?  Retinal artery occlusion, central ? ?#Central retinal artery occlusion ?Patient presented with several hours of R eye vision loss. The vision loss was painless and patient described it as cloudiness. He was evaluated by opthalmology who performed a dilated eye exam and felt patient had CRAO. He was sent to the ED for further work up.  ? ?His risk factors for CVA include his diabetes. CTA of the head an neck showed no evidence of flow limiting stenosis of the carotid arteries. Patient was evaluated by neurology who noted patient was out of  the window for lytic therapy and that patient should be treated as acute stroke. In addition he should have optimization of his risk factors.  ? ?GCA was also considered given patient's age and description of painless vision loss. However, he denied fever or headache. ESR/CRP were pending as of this note.  ? ?Finally, also considered venous occlusion given patients report of DVT/PE in the past. However, he notes that this was a provoked event in the setting of immobility secondary to hip injury.  ?-F/u neurology recommendations ?-F/u ESR/CRP ?-F:u TTE ?-Telemetry ?-Lipid panel, A1c ?-DAPT (ASA and plavix) per neuro ? ?#T2DM ?Patient's last a1c 7.5  08/2021. He follows with endocrinology and has extensive insulin and oral antidiabetic regimen as previously mentioned. Will continue insulin regimen while inpatient. Will hold metformin in the setting of IV contrast load.   ? ? ?Dispo: Admit patient to observation ? ?Signed: ?Rick Duff, MD ?11/16/2021, 12:08 AM  ?After 5pm on weekdays and 1pm on weekends: On Call pager: [phone]  ?

## 2021-11-16 NOTE — Evaluation (Signed)
Physical Therapy Evaluation ?Patient Details ?Name: Gregory May ?MRN: [id number] ?DOB: 09-24-1960 ?Today's Date: 11/16/2021 ? ?History of Present Illness ? Pt is a 61 y/o male admitted secondary to vision loss in R eye. MRI head negative. Found to have retinal artery occlusion. PMH includes a fib, HTN, CKD, DM, and glaucoma.  ?Clinical Impression ? Pt admitted secondary to problem above with deficits below. Requiring min guard A for mobility tasks this session. Mild unsteadiness noted. Discussed using cane vs crutch for increased stability and pt reports he would feel more comfortable with a crutch. Also discussed following up with [employer] to ensure appropriate prosthetic fit. Anticipate pt will progress well and will not require follow up PT. Will continue to follow acutely.    ?   ? ?Recommendations for follow up therapy are one component of a multi-disciplinary discharge planning process, led by the attending physician.  Recommendations may be updated based on patient status, additional functional criteria and insurance authorization. ? ?Follow Up Recommendations No PT follow up ? ?  ?Assistance Recommended at Discharge Frequent or constant Supervision/Assistance  ?Patient can return home with the following ? Assist for transportation;Assistance with cooking/housework;Help with stairs or ramp for entrance ? ?  ?Equipment Recommendations None recommended by PT  ?Recommendations for Other Services ?    ?  ?Functional Status Assessment Patient has had a recent decline in their functional status and demonstrates the ability to make significant improvements in function in a reasonable and predictable amount of time.  ? ?  ?Precautions / Restrictions Precautions ?Precautions: Fall;Other (comment) ?Precaution Comments: R BKA ?Required Braces or Orthoses: Other Brace ?Other Brace: R prosthetic ?Restrictions ?Weight Bearing Restrictions: No  ? ?  ? ?Mobility ? Bed Mobility ?Overal bed mobility: Modified Independent ?   ?  ?  ?  ?  ?  ?  ?  ? ?Transfers ?Overall transfer level: Needs assistance ?Equipment used: None ?Transfers: Sit to/from Stand ?Sit to Stand: Min guard ?  ?  ?  ?  ?  ?General transfer comment: Min guard for safety. ?  ? ?Ambulation/Gait ?Ambulation/Gait assistance: Min guard ?Gait Distance (Feet): 100 Feet ?Assistive device: None ?Gait Pattern/deviations: Step-through pattern, Decreased stride length ?Gait velocity: Decreased ?  ?  ?General Gait Details: Min guard for safety. Mild unsteadiness noted, but no overt LOB noted. Discussed using crutch vs cane for mobility and pt reports he would feel more comfortable with use of crutch. ? ?Stairs ?  ?  ?  ?  ?  ? ?Wheelchair Mobility ?  ? ?Modified Rankin (Stroke Patients Only) ?  ? ?  ? ?Balance Overall balance assessment: Mild deficits observed, not formally tested ?  ?  ?  ?  ?  ?  ?  ?  ?  ?  ?  ?  ?  ?  ?  ?  ?  ?  ?   ? ? ? ?Pertinent Vitals/Pain Pain Assessment ?Pain Assessment: Faces ?Faces Pain Scale: Hurts even more ?Pain Location: phantom pain on RLE ?Pain Descriptors / Indicators: Guarding, Grimacing ?Pain Intervention(s): Limited activity within patient's tolerance, Monitored during session, Repositioned  ? ? ?Home Living Family/patient expects to be discharged to:: Private residence ?Living Arrangements: Other relatives (brother) ?Available Help at Discharge: Family;Available PRN/intermittently ?Type of Home: House ?Home Access: Stairs to enter ?Entrance Stairs-Rails: Right ?Entrance Stairs-Number of Steps: 3 ?  ?Home Layout: One level ?Home Equipment: Control and instrumentation engineer (2 wheels);Crutches ?   ?  ?Prior Function Prior Level of Function :  Independent/Modified Independent ?  ?  ?  ?  ?  ?  ?  ?ADLs Comments: Family assists with driving to appointments ?  ? ? ?Hand Dominance  ?   ? ?  ?Extremity/Trunk Assessment  ? Upper Extremity Assessment ?Upper Extremity Assessment: Defer to OT evaluation ?  ? ?Lower Extremity Assessment ?Lower  Extremity Assessment: RLE deficits/detail ?RLE Deficits / Details: R BKA at baseline. Reporting phantom limb pain ?  ? ?Cervical / Trunk Assessment ?Cervical / Trunk Assessment: Normal  ?Communication  ? Communication: No difficulties  ?Cognition Arousal/Alertness: Awake/alert ?Behavior During Therapy: [facility] for tasks assessed/performed ?Overall Cognitive Status: Within Functional Limits for tasks assessed ?  ?  ?  ?  ?  ?  ?  ?  ?  ?  ?  ?  ?  ?  ?  ?  ?  ?  ?  ? ?  ?General Comments General comments (skin integrity, edema, etc.): Discussed following up with [employer] to ensure prosthetic is fitting appropriately. ? ?  ?Exercises    ? ?Assessment/Plan  ?  ?PT Assessment Patient needs continued PT services  ?PT Problem List Decreased strength;Decreased balance;Decreased activity tolerance;Decreased mobility ? ?   ?  ?PT Treatment Interventions DME instruction;Gait training;Stair training;Therapeutic activities;Functional mobility training;Therapeutic exercise;Balance training;Patient/family education   ? ?PT Goals (Current goals can be found in the Care Plan section)  ?Acute Rehab PT Goals ?Patient Stated Goal: to go home ?PT Goal Formulation: With patient ?Time For Goal Achievement: 11/30/21 ?Potential to Achieve Goals: Good ? ?  ?Frequency Min 3X/week ?  ? ? ?Co-evaluation PT/OT/SLP Co-Evaluation/Treatment: Yes ?Reason for Co-Treatment: For patient/therapist safety;To address functional/ADL transfers ?PT goals addressed during session: Mobility/safety with mobility ?  ?  ? ? ?  ?AM-PAC PT "6 Clicks" Mobility  ?Outcome Measure Help needed turning from your back to your side while in a flat bed without using bedrails?: None ?Help needed moving from lying on your back to sitting on the side of a flat bed without using bedrails?: A Little ?Help needed moving to and from a bed to a chair (including a wheelchair)?: A Little ?Help needed standing up from a chair using your arms (e.g., wheelchair or bedside chair)?: A  Little ?Help needed to walk in hospital room?: A Little ?Help needed climbing 3-5 steps with a railing? : A Little ?6 Click Score: 19 ? ?  ?End of Session Equipment Utilized During Treatment: Gait belt ?Activity Tolerance: Patient tolerated treatment well ?Patient left: in bed;with call bell/phone within reach (on stretcher in ED) ?Nurse Communication: Mobility status ?PT Visit Diagnosis: Unsteadiness on feet (R26.81) ?  ? ?Time: 7371-0626 ?PT Time Calculation (min) (ACUTE ONLY): 26 min ? ? ?Charges:   PT Evaluation ?$PT Eval Low Complexity: 1 Low ?  ?  ?   ? ? ?Farley Ly, PT, DPT  ?Acute Rehabilitation Services  ?Pager: [phone] ?Office: [phone] ? ? ?[place] Varnell Orvis ?11/16/2021, 12:07 PM ?

## 2021-11-17 ENCOUNTER — Observation Stay (HOSPITAL_BASED_OUTPATIENT_CLINIC_OR_DEPARTMENT_OTHER): Payer: Medicare HMO

## 2021-11-17 ENCOUNTER — Other Ambulatory Visit (HOSPITAL_COMMUNITY): Payer: Self-pay

## 2021-11-17 ENCOUNTER — Encounter (HOSPITAL_COMMUNITY): Payer: Self-pay | Admitting: Internal Medicine

## 2021-11-17 DIAGNOSIS — Z86718 Personal history of other venous thrombosis and embolism: Secondary | ICD-10-CM

## 2021-11-17 DIAGNOSIS — H3411 Central retinal artery occlusion, right eye: Secondary | ICD-10-CM | POA: Diagnosis not present

## 2021-11-17 LAB — GLUCOSE, CAPILLARY
Glucose-Capillary: 212 mg/dL — ABNORMAL HIGH (ref 70–99)
Glucose-Capillary: 74 mg/dL (ref 70–99)
Glucose-Capillary: 145 mg/dL — ABNORMAL HIGH (ref 70–99)

## 2021-11-17 LAB — ANTITHROMBIN III: AntiThromb III Func: 121 % — ABNORMAL HIGH (ref 75–120)

## 2021-11-17 LAB — C-REACTIVE PROTEIN: CRP: 0.7 mg/dL (ref <1.0)

## 2021-11-17 MED ORDER — APIXABAN 5 MG PO TABS
5.0000 mg | ORAL_TABLET | Freq: Two times a day (BID) | ORAL | 3 refills | Status: DC
  Filled 2021-11-17: qty 60, 30d supply, fill #0

## 2021-11-17 MED ORDER — APIXABAN 5 MG PO TABS
5.0000 mg | ORAL_TABLET | Freq: Two times a day (BID) | ORAL | Status: DC
  Administered 2021-11-17: 5 mg via ORAL
  Filled 2021-11-17: qty 1

## 2021-11-17 MED ORDER — ROSUVASTATIN CALCIUM 20 MG PO TABS
20.0000 mg | ORAL_TABLET | Freq: Every day | ORAL | 1 refills | Status: DC
  Filled 2021-11-17 – 2021-11-24 (×2): qty 90, 90d supply, fill #0
  Filled 2021-11-24: qty 90, 90d supply, fill #1

## 2021-11-17 MED ORDER — APIXABAN 5 MG PO TABS
5.0000 mg | ORAL_TABLET | Freq: Once | ORAL | 0 refills | Status: DC
Start: 2021-11-17 — End: 2021-11-17
  Filled 2021-11-17: qty 1, 1d supply, fill #0

## 2021-11-17 MED ORDER — APIXABAN 5 MG PO TABS
5.0000 mg | ORAL_TABLET | Freq: Two times a day (BID) | ORAL | 1 refills | Status: DC
  Filled 2021-11-17: qty 180, 90d supply, fill #0
  Filled 2021-11-24: qty 60, 30d supply, fill #0
  Filled 2021-11-24 – 2021-12-18 (×2): qty 180, 90d supply, fill #0
  Filled 2021-12-18: qty 60, 30d supply, fill #0

## 2021-11-17 NOTE — TOC Initial Note (Signed)
Transition of Care (TOC) - Initial/Assessment Note  ? ? ?Patient Details  ?Name: Gregory May ?MRN: [id number] ?Date of Birth: 11-01-1960 ? ?Transition of Care (TOC) CM/SW Contact:    ?Kermit Balo, RN ?Phone Number: ?11/17/2021, 12:26 PM ? ?Clinical Narrative:                 ? ? ?Expected Discharge Plan: Home/Self Care ?Barriers to Discharge: Continued Medical Work up ? ? ?Patient Goals and CMS Choice ? Patient is from home with his brother that works in the daytime and is home at night.  ?Patient manages his own medications at home and denies any issues.  ?Pt states his aunt provides transportation to medical appointments. He is interested in having another resource. CM encouraged him to call his [employer] and see if he qualifies for transportation benefits. CM will also provide some numbers for transportation assistance on the AVS. ?No f/u per PT/OT. ?TOC following. ?  ?  ? ?Expected Discharge Plan and Services ?Expected Discharge Plan: Home/Self Care ?  ?Discharge Planning Services: CM Consult ?  ?Living arrangements for the past 2 months: Single Family Home ?                ?  ?  ?  ?  ?  ?  ?  ?  ?  ?  ? ?Prior Living Arrangements/Services ?Living arrangements for the past 2 months: Single Family Home ?Lives with:: Siblings ?Patient language and need for interpreter reviewed:: Yes ?Do you feel safe going back to the place where you live?: Yes      ?  ?  ?Current home services: DME (shower seat/ prosthesis/ cane) ?Criminal Activity/Legal Involvement Pertinent to Current Situation/Hospitalization: No - Comment as needed ? ?Activities of Daily Living ?Home Assistive Devices/Equipment: Prosthesis ?ADL Screening (condition at time of admission) ?Patient's cognitive ability adequate to safely complete daily activities?: Yes ?Is the patient deaf or have difficulty hearing?: No ?Does the patient have difficulty seeing, even when wearing glasses/contacts?: No ?Does the patient have difficulty concentrating,  remembering, or making decisions?: No ?Patient able to express need for assistance with ADLs?: Yes ?Does the patient have difficulty dressing or bathing?: No ?Independently performs ADLs?: Yes (appropriate for developmental age) ?Communication: Independent ?Dressing (OT): Independent ?Grooming: Independent ?Feeding: Independent ?Bathing: Independent ?Toileting: Independent ?In/Out Bed: Independent ?Walks in Home: Independent ?Does the patient have difficulty walking or climbing stairs?: No ?Weakness of Legs: Right ?Weakness of Arms/Hands: None ? ?Permission Sought/Granted ?  ?  ?   ?   ?   ?   ? ?Emotional Assessment ?Appearance:: Appears stated age ?Attitude/Demeanor/Rapport: Engaged ?Affect (typically observed): Accepting ?Orientation: : Oriented to Self, Oriented to Place, Oriented to  Time, Oriented to Situation ?  ?Psych Involvement: No (comment) ? ?Admission diagnosis:  Retinal artery occlusion, central [H34.10] ?Vision loss of right eye [H54.61] ?Patient Active Problem List  ? Diagnosis Date Noted  ? Retinal artery occlusion, central 11/15/2021  ? Epidermal inclusion cyst 10/18/2021  ? 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 Cellulitis of finger of right hand 08/17/2021  ? Osteomyelitis of second toe of left foot (HCC)   ? Hepatic steatosis 06/24/2020  ? Back pain 04/20/2020  ? Flu vaccine need 04/20/2020  ? COVID-19 virus vaccination declined 04/20/2020  ? Left knee pain 04/20/2020  ? Allergic sinusitis 04/20/2020  ? Elevated liver enzymes 04/20/2020  ? Diastasis of rectus abdominis 07/16/2019  ? Idiopathic chronic venous hypertension of left lower extremity with  inflammation 10/07/2018  ? Essential hypertension 06/19/2018  ? Peripheral neuropathy 12/19/2017  ? Acquired absence of right leg below knee (HCC) 04/17/2017  ? Carpal tunnel syndrome 03/18/2017  ? Colon cancer screening 06/21/2015  ? Status post below knee amputation of right lower extremity (HCC) 06/07/2015  ? Osteomyelitis of  third toe of left foot (HCC) 06/01/2015  ? Diabetic polyneuropathy associated with type 2 diabetes mellitus (HCC) 12/09/2014  ? Diabetes mellitus with carpal tunnel syndrome (HCC) 12/09/2014  ? DJD (degenerative joint disease) of knee 07/21/2014  ? Osteoarthritis, hip, bilateral 05/25/2014  ? Onychomycosis 10/14/2013  ? Pure hypercholesterolemia 09/25/2013  ? Diabetic foot ulcer (HCC) 09/15/2013  ? Poorly controlled type 2 diabetes mellitus with complication (HCC) 09/15/1997  ? ?PCP:  [name], MD ?Pharmacy:   ?[facility] - [place], [place] - [phone] ?[phone] ?[place] [address] ?Phone: [phone] Fax: [phone] ? ? ? ? ?Social Determinants of Health (SDOH) Interventions ?  ? ?Readmission Risk Interventions ?   ? View : No data to display.  ?  ?  ?  ? ? ? ?

## 2021-11-17 NOTE — Progress Notes (Signed)
ANTICOAGULATION CONSULT NOTE - Initial Consult ? ?Pharmacy Consult for apixaban ?Indication: DVT ? ?No Known Allergies ? ?Patient Measurements: ?Height: 6' (182.9 cm) ?Weight: 86.2 kg (190 lb 0.6 oz) ?IBW/kg (Calculated) : 77.6 ? ?Vital Signs: ?Temp: 98.1 ?F (36.7 ?C) (04/07 1151) ?Temp Source: Oral (04/07 1151) ?BP: 124/80 (04/07 1151) ? ?Labs: ?Recent Labs  ?  11/15/21 ?1703 11/16/21 ?0340  ?HGB 15.6 15.3  ?HCT 48.3 46.2  ?PLT 300 257  ?APTT 27  --   ?LABPROT 12.9  --   ?INR 1.0  --   ?CREATININE 0.83 0.86  ? ? ?Estimated Creatinine Clearance: 101.5 mL/min (by C-G formula based on SCr of 0.86 mg/dL). ? ? ?Medical History: ?Past Medical History:  ?Diagnosis Date  ? 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 Cutaneous abscess of left foot   ? Deep vein thrombosis (DVT) (HCC)   ? Diabetes mellitus   ? type II  ? Diabetic ulcer of heel ([place])   ? Right heel  ? DJD (degenerative joint disease)   ? DVT (deep venous thrombosis) ([place]) 02/08/2014  ? Proximal provoked. Date of diagnosis February 08 2014 Duration of anticoagulation: 6 months. End date 08/12/2014.  Anticoagulant: Lovenox 120 units daily Switched to Eliquis on 05/25/2014     ? Ear drum perforation, right 03/06/2019  ? Non-pressure chronic ulcer of right calf, limited to breakdown of skin ([place]) 04/17/2017  ? Pulmonary emboli ([place]) 02/08/2014  ? Date of diagnosis February 08 2014, on chest CTA Hospitalized for 3 days Had some symptoms of shortness of breath, and chest pain With intercurrent DVT of the left LE. Duration of anticoagulation: 8 months. End date 10/11/2014.  Anticoagulant: Lovenox 120 units daily Switched to Eliquis on 05/25/2014 per patient preference   ? Pulmonary embolism ([place])   ? Sebaceous cyst   ? on back of neck  ? ? ?Assessment: ?75 yoM admitted with embolic stroke. Pt noted to have PFO on ECHO and likely chronic DVT on dopplers. Neurology recommending stopping antiplatelets and starting apixaban 5mg  BID - agree with deferring load with infarcts. ? ? ?Plan:  ?Apixaban  5mg  BID ?Pharmacy will sign off, reconsut if needed ? ?Arrie Senate, PharmD, BCPS, BCCP ?Clinical Pharmacist ?[phone] ?Please check AMION for all [facility] numbers ?11/17/2021 ? ? ?

## 2021-11-17 NOTE — Discharge Instructions (Addendum)
Dear Gregory May,  ? ?Thank you for trusting [place] with your care.  ? ?We evaluated you for vision loss in your right eye. ?Please follow-up in the internal medicine clinic for hospital follow-up. ?Please also ensure that you follow-up with the structural heart team for evaluation of the hole in your heart. Dr. Sherren Mocha is the cardiologist. Please ensure that you follow-up in the neurology stroke clinic at [facility]. Please see them in 2 months.  Additionally we found blood clots in both of your legs.  We will treat these with Eliquis.  Please take this medication daily. ?

## 2021-11-17 NOTE — Progress Notes (Signed)
Taxi voucher placed on hard chart. RN aware. ? ?Cristobal Goldmann ?LCSW, MSW, MHA ? ?

## 2021-11-17 NOTE — Progress Notes (Signed)
Admitted to the unit last night. Slept well on this shift. Condition remains stable. CBG monitored. NSR on telemetry. Rt BKA with prosthesis. Infers right eye vision getting better as at last night. Neurology consult  ongoing. Uses the bathroom with minimal assistance. Safety maintained at all times. ?

## 2021-11-17 NOTE — Progress Notes (Addendum)
STROKE TEAM PROGRESS NOTE  ? ? ? ?INTERVAL HISTORY ?Patient is sitting on the side of the bed.  Patient states his right-sided vision remains poor but has slightly improved.  Today he has improved vision in the outer rim of his visual field.  He denies any other focal strokelike symptoms.   Lower extremity venous Dopplers positive for chronic DVT.  Plans are for outpatient TEE.  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  With a moderate-sized PFO ? ?OBJECTIVE ?Vitals:  ? 11/16/21 2346 11/17/21 0326 11/17/21 0751 11/17/21 1151  ?BP: (!) 147/97 121/73 (!) 168/87 124/80  ?Pulse: 67 62    ?Resp: 14 15    ?Temp:  98 ?F (36.7 ?C) 98.3 ?F (36.8 ?C) 98.1 ?F (36.7 ?C)  ?TempSrc:  Oral Oral Oral  ?SpO2:  96% 95% 97%  ?Weight:      ?Height:      ? ? ?CBC:  ?Recent Labs  ?Lab 11/15/21 ?1703 11/16/21 ?0340  ?WBC 10.1 8.2  ?NEUTROABS 6.7  --   ?HGB 15.6 15.3  ?HCT 48.3 46.2  ?MCV 97.2 95.9  ?PLT 300 257  ? ? ? ?Basic Metabolic Panel:  ?Recent Labs  ?Lab 11/15/21 ?1703 11/15/21 ?1746 11/16/21 ?0340  ?NA 137  --  136  ?K 4.2  --  3.8  ?CL 104  --  103  ?CO2 24  --  25  ?GLUCOSE 130*  --  223*  ?BUN 20  --  18  ?CREATININE 0.83  --  0.86  ?CALCIUM 9.8  --  9.1  ?MG  --  1.9  --   ? ? ? ?Lipid Panel:  ?   ?Component Value Date/Time  ? CHOL 160 11/16/2021 0036  ? TRIG 82 11/16/2021 0036  ? HDL 43 11/16/2021 0036  ? CHOLHDL 3.7 11/16/2021 0036  ? VLDL 16 11/16/2021 0036  ? [place] 101 (H) 11/16/2021 0036  ? ?HgbA1c:  ?Lab Results  ?Component Value Date  ? HGBA1C 6.8 (H) 11/16/2021  ? ?Urine Drug Screen:  ?   ?Component Value Date/Time  ? LABOPIA POSITIVE (A) 06/01/2015 2348  ? COCAINSCRNUR POSITIVE (A) 06/01/2015 2348  ? LABBENZ NONE DETECTED 06/01/2015 2348  ? AMPHETMU NONE DETECTED 06/01/2015 2348  ? THCU NONE DETECTED 06/01/2015 2348  ?  LABBARB NONE DETECTED 06/01/2015 2348  ?  ?Alcohol Level  ?   ?Component Value Date/Time  ? ETH <10 06/02/2020 0244  ? ? ?IMAGING ? ? ?CT ANGIO HEAD NECK W WO CM ? ?Result Date: 11/15/2021 ?CLINICAL DATA:  Central retinal artery occlusion. Right eye vision loss. EXAM: CT ANGIOGRAPHY HEAD AND NECK TECHNIQUE: Multidetector CT imaging of the head and neck was performed using the standard protocol during bolus administration of intravenous contrast. Multiplanar CT image reconstructions and MIPs were obtained to evaluate the vascular anatomy. Carotid stenosis measurements (when applicable) are obtained utilizing NASCET criteria, using the distal internal carotid diameter as the denominator. RADIATION DOSE REDUCTION: This exam was performed according to the departmental dose-optimization program which includes automated exposure control, adjustment of the mA and/or kV according to patient size and/or use of iterative reconstruction technique. CONTRAST:  7mL OMNIPAQUE IOHEXOL 350 MG/ML SOLN COMPARISON:  None. FINDINGS: CTA NECK FINDINGS SKELETON: There is no bony spinal canal stenosis. No lytic or blastic lesion.  OTHER NECK: Normal pharynx, larynx and major salivary glands. No cervical lymphadenopathy. Unremarkable thyroid gland. UPPER CHEST: No pneumothorax or pleural effusion. No nodules or masses. AORTIC ARCH: There is calcific atherosclerosis of the aortic arch. There is no aneurysm, dissection or hemodynamically significant stenosis of the visualized portion of the aorta. Conventional 3 vessel aortic branching pattern. The visualized proximal subclavian arteries are widely patent. RIGHT CAROTID SYSTEM: No dissection, occlusion or aneurysm. Mild atherosclerotic calcification at the carotid bifurcation without hemodynamically significant stenosis. LEFT CAROTID SYSTEM: No dissection, occlusion or aneurysm. Mild atherosclerotic calcification at the carotid bifurcation without hemodynamically significant stenosis. VERTEBRAL  ARTERIES: Left dominant configuration. Both origins are clearly patent. There is no dissection, occlusion or flow-limiting stenosis to the skull base (V1-V3 segments). CTA HEAD FINDINGS POSTERIOR CIRCULATION: --Vertebral arteries: Normal V4 segments. --Inferior cerebellar arteries: Normal. --Basilar artery: Normal. --Superior cerebellar arteries: Normal. --Posterior cerebral arteries (PCA): Normal. ANTERIOR CIRCULATION: --Intracranial internal carotid arteries: Normal. --Anterior cerebral arteries (ACA): Normal. Both A1 segments are present. Patent anterior communicating artery (a-comm). --Middle cerebral arteries (MCA): Normal. VENOUS SINUSES: As permitted by contrast timing, patent. ANATOMIC VARIANTS: None Review of the MIP images confirms the above findings. IMPRESSION: 1. No emergent large vessel occlusion or high-grade stenosis of the intracranial or cervical arteries. Aortic Atherosclerosis (ICD10-I70.0). Electronically Signed   By: Ulyses Jarred M.D.   On: 11/15/2021 23:04  ? ?CT HEAD WO CONTRAST (5MM) ? ?Result Date: 11/15/2021 ?CLINICAL DATA:  Blurry vision right eye, headaches EXAM: CT HEAD WITHOUT CONTRAST TECHNIQUE: Contiguous axial images were obtained from the base of the skull through the vertex without intravenous contrast. RADIATION DOSE REDUCTION: This exam was performed according to the departmental dose-optimization program which includes automated exposure control, adjustment of the mA and/or kV according to patient size and/or use of iterative reconstruction technique. COMPARISON:  None. FINDINGS: Brain: No acute intracranial findings are seen in noncontrast CT brain. Cortical sulci are prominent. There is decreased density in the periventricular white matter. There is possible old lacunar infarct in the right frontal lobe adjacent to the frontal horn. There is linear focus of encephalomalacia in the posterior left cerebellum suggesting old infarct. Vascular: Unremarkable. Skull: Unremarkable.  Sinuses/Orbits: There is mild mucosal thickening in the ethmoid sinus. Optic globes are symmetrical. Retrobulbar soft tissues are unremarkable. Other: None IMPRESSION: No acute intracranial findings are seen in noncontrast CT brain. Atrophy. 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 Chronic ethmoid sinusitis. No focal abnormality is seen in the orbits. Electronically Signed   By: Elmer Picker M.D.   On: 11/15/2021 19:18  ? ?MR BRAIN WO CONTRAST ? ?Result Date: 11/16/2021 ?CLINICAL DATA:  Stroke follow-up. EXAM: MRI HEAD WITHOUT CONTRAST TECHNIQUE: Multiplanar, multiecho pulse sequences of the brain and surrounding structures were obtained without intravenous contrast. COMPARISON:  CT head CT angio head 11/15/2021 FINDINGS: Brain: Multiple small areas of acute infarct are present in the right MCA territory. This involves the head of caudate on the right, the right temporal white matter. The sub ependymal temporal lobe on the right, and the right temporoparietal lobe. In addition, small acute infarct in the right posterior frontal cortex laterally. Ventricle size normal. Chronic microvascular ischemic change in the white matter. Small chronic infarct right frontal lobe. Chronic infarct left posterior cerebellum. Small chronic infarcts in the thalamus bilaterally. No hemorrhage or mass lesion. Vascular: Normal arterial flow voids at the skull base. Skull and upper cervical spine: No focal skeletal lesion. Sinuses/Orbits: Paranasal sinuses clear.  Negative orbit Other: None IMPRESSION: Multiple small 3-5 mm areas of acute infarct in the right MCA territory. Findings suggests emboli. Review of the CTA reveals no source in the aortic arch or right carotid. Atrophy and chronic ischemic changes. Electronically Signed   By: Franchot Gallo M.D.   On: 11/16/2021 19:05  ? ?ECHOCARDIOGRAM COMPLETE BUBBLE STUDY ? ?Result Date: 11/16/2021 ?   ECHOCARDIOGRAM REPORT   Patient Name:   Gregory May Date of Exam: 11/16/2021 Medical Rec #:  [id number]           Height:       72.0 in Accession #:    [id number]          Weight:       209.0 lb Date of Birth:  Aug 22, 1960           BSA:          2.

## 2021-11-17 NOTE — Progress Notes (Signed)
Lower extremity venous bilateral study completed.   Please see CV Proc for preliminary results.   Amyria Komar, RDMS, RVT  

## 2021-11-17 NOTE — Progress Notes (Signed)
Discharge paperwork reviewed with pt. Pt verbalized understanding. Pt alert and oriented x4 in no acute distress. Pt wheeled down to main entrance. Pt has taken all of his belongings. TOC meds in pt's possession. Pt's cousin transported him home via private vehicle.  ?

## 2021-11-17 NOTE — Care Management Obs Status (Signed)
MEDICARE OBSERVATION STATUS NOTIFICATION ? ? ?Patient Details  ?Name: Gregory May ?MRN: [id number] ?Date of Birth: 12-21-1960 ? ? ?Medicare Observation Status Notification Given:  Yes ? ? ? ?Kermit Balo, RN ?11/17/2021, 11:42 AM ?

## 2021-11-18 LAB — BETA-2-GLYCOPROTEIN I ABS, IGG/M/A
Beta-2 Glyco I IgG: <9 GPI IgG units (ref 0–20)
Beta-2-Glycoprotein I IgA: 12 GPI IgA units (ref 0–25)
Beta-2-Glycoprotein I IgM: <9 GPI IgM units (ref 0–32)

## 2021-11-18 LAB — PROTEIN C, TOTAL: Protein C, Total: 96 % (ref 60–150)

## 2021-11-19 LAB — PROTEIN S ACTIVITY: Protein S Activity: 97 % (ref 63–140)

## 2021-11-19 LAB — PROTEIN C ACTIVITY: Protein C Activity: 115 % (ref 73–180)

## 2021-11-19 LAB — HOMOCYSTEINE: Homocysteine: 13.5 umol/L (ref 0.0–14.5)

## 2021-11-19 LAB — PROTEIN S, TOTAL: Protein S Ag, Total: 109 % (ref 60–150)

## 2021-11-20 ENCOUNTER — Other Ambulatory Visit: Payer: Self-pay | Admitting: Endocrinology

## 2021-11-20 ENCOUNTER — Encounter: Payer: Self-pay | Admitting: Radiology

## 2021-11-20 ENCOUNTER — Other Ambulatory Visit (HOSPITAL_COMMUNITY): Payer: Self-pay

## 2021-11-20 ENCOUNTER — Other Ambulatory Visit: Payer: Self-pay | Admitting: Physician Assistant

## 2021-11-20 DIAGNOSIS — I4891 Unspecified atrial fibrillation: Secondary | ICD-10-CM

## 2021-11-20 DIAGNOSIS — H3410 Central retinal artery occlusion, unspecified eye: Secondary | ICD-10-CM

## 2021-11-20 NOTE — Progress Notes (Signed)
Enrolled patient for a 30 day [employer] Event Monitor to be mailed to patients home. Patient knows to call us with any questions or help applying monitor. Follow up is scheduled with Burt Knack. ?

## 2021-11-22 LAB — FACTOR 5 LEIDEN

## 2021-11-23 ENCOUNTER — Ambulatory Visit (INDEPENDENT_AMBULATORY_CARE_PROVIDER_SITE_OTHER): Payer: Medicare HMO | Admitting: Internal Medicine

## 2021-11-23 ENCOUNTER — Encounter: Payer: Self-pay | Admitting: Internal Medicine

## 2021-11-23 VITALS — BP 128/82 | HR 77 | Temp 97.9°F | Wt 202.0 lb

## 2021-11-23 DIAGNOSIS — Z5982 Transportation insecurity: Secondary | ICD-10-CM

## 2021-11-23 DIAGNOSIS — Z8673 Personal history of transient ischemic attack (TIA), and cerebral infarction without residual deficits: Secondary | ICD-10-CM

## 2021-11-23 DIAGNOSIS — I63412 Cerebral infarction due to embolism of left middle cerebral artery: Secondary | ICD-10-CM

## 2021-11-23 DIAGNOSIS — H3411 Central retinal artery occlusion, right eye: Secondary | ICD-10-CM | POA: Diagnosis not present

## 2021-11-23 DIAGNOSIS — I10 Essential (primary) hypertension: Secondary | ICD-10-CM | POA: Diagnosis not present

## 2021-11-23 DIAGNOSIS — E1165 Type 2 diabetes mellitus with hyperglycemia: Secondary | ICD-10-CM

## 2021-11-23 LAB — ANTIPHOSPHOLIPID SYNDROME EVAL, BLD
Anticardiolipin IgA: <9 APL U/mL (ref 0–11)
Anticardiolipin IgG: <9 GPL U/mL (ref 0–14)
Anticardiolipin IgM: <9 MPL U/mL (ref 0–12)
DRVVT: 36.9 s (ref 0.0–47.0)
PTT Lupus Anticoagulant: 34.6 s (ref 0.0–43.5)
Phosphatydalserine, IgA: <1 APS Units (ref 0–19)
Phosphatydalserine, IgG: <9 Units (ref 0–30)
Phosphatydalserine, IgM: <10 Units (ref 0–30)

## 2021-11-23 NOTE — Assessment & Plan Note (Signed)
BP well controlled today at 128/82 ?

## 2021-11-23 NOTE — Assessment & Plan Note (Signed)
Difficulty attending appointments ?- referral to SW ?

## 2021-11-23 NOTE — Assessment & Plan Note (Signed)
Did not bring meter with him today but has follow up appointment with endocrinology tomorrow. ?- foot exam today ?

## 2021-11-23 NOTE — Progress Notes (Signed)
? ?  CC: hospital follow up ? ?HPI: ? ?[name] is a 61 y.o. PMH noted below, who presents to the [facility] with complaints of hospital follow up for vision loss. To see the management of his acute and chronic conditions, please refer to the A&P note under the encounters tab.  ? ?Past Medical History:  ?Diagnosis Date  ? 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 Cutaneous abscess of left foot   ? Deep vein thrombosis (DVT) (HCC)   ? Diabetes mellitus   ? type II  ? Diabetic ulcer of heel ([place])   ? Right heel  ? DJD (degenerative joint disease)   ? DVT (deep venous thrombosis) ([place]) 02/08/2014  ? Proximal provoked. Date of diagnosis February 08 2014 Duration of anticoagulation: 6 months. End date 08/12/2014.  Anticoagulant: Lovenox 120 units daily Switched to Eliquis on 05/25/2014     ? Ear drum perforation, right 03/06/2019  ? Non-pressure chronic ulcer of right calf, limited to breakdown of skin ([place]) 04/17/2017  ? Pulmonary emboli ([place]) 02/08/2014  ? Date of diagnosis February 08 2014, on chest CTA Hospitalized for 3 days Had some symptoms of shortness of breath, and chest pain With intercurrent DVT of the left LE. Duration of anticoagulation: 8 months. End date 10/11/2014.  Anticoagulant: Lovenox 120 units daily Switched to Eliquis on 05/25/2014 per patient preference   ? Pulmonary embolism ([place])   ? Sebaceous cyst   ? on back of neck  ? ?Review of Systems:  positive for vision loss, fatigue ? ?Physical Exam: ?Gen: chronically ill appearing man in NAD ?HEENT: Left eye vision 20/50 on snellen chart, Right eye central vision unable to make out the chart at all, however able to count fingers accurately in peripheral vision. ?CV: RRR, no m/r/g  ?Resp: CTAB, normal WOB  ?GI: soft, nontender ?MSK: Right BKA, left leg with three toes present, small callous on the tip of the third toe, Pulses intact, no lower extremity edema ?Neuro:alert answering questions appropriately ?Psych: normal affect ? ? ?Assessment & Plan:  ? ?See Encounters  Tab for problem based charting. ? ?Patient discussed with Dr. Philipp Ovens  ? ?

## 2021-11-23 NOTE — Patient Instructions (Signed)
Gregory May ? ?It was a pleasure seeing you in the clinic today.  ? ?We talked about your vision, your blood clots, your diabetes, and transportation. ? ?Diabetes- get your A1c checked at the endocrinologist tomorrow and make sure you bring your meter ?Vision loss- I will refer you to the neurologist. Please also keep your eye doctor appointment ?Social work- Our social work will get in Aeronautical engineer with you to discuss transportation options.  ? ?Please call our clinic at [phone] if you have any questions or concerns. The best time to call is Monday-Friday from 9am-4pm, but there is someone available 24/7 at the same number. If you need medication refills, please notify your pharmacy one week in advance and they will send [place] a request. ?  ?Thank you for letting us take part in your care. We look forward to seeing you next time! ? ?

## 2021-11-23 NOTE — Assessment & Plan Note (Addendum)
Thought to be embolic in nature related to PFO. Seen on MRI after April hospitalization. Infarcts were small with no focal deficits ?- neurology referral ?- eliquis ?

## 2021-11-23 NOTE — Assessment & Plan Note (Signed)
Central vision still severely impaired, doing well on eliquis. ?- scheduled to see eye doctor tomorrow ?- continue eliquis ?

## 2021-11-24 ENCOUNTER — Telehealth (HOSPITAL_COMMUNITY): Payer: Self-pay

## 2021-11-24 ENCOUNTER — Telehealth: Payer: Self-pay | Admitting: *Deleted

## 2021-11-24 ENCOUNTER — Other Ambulatory Visit (HOSPITAL_COMMUNITY): Payer: Self-pay

## 2021-11-24 DIAGNOSIS — H3411 Central retinal artery occlusion, right eye: Secondary | ICD-10-CM | POA: Diagnosis not present

## 2021-11-24 NOTE — Telephone Encounter (Incomplete)
Pharmacy Transitions of Care Follow-up Telephone Call ? ?Date of discharge: 11/17/21  ?Discharge Diagnosis: chronic DVT, CVA ? ?How have you been since you were released from the hospital? ***  ? ?Medication changes made at discharge: ?START taking: ?apixaban [place])  ?rosuvastatin (CRESTOR)  ?Icon medications to stop taking   STOP taking: ?aspirin EC 81 MG tablet  ?diclofenac Sodium 1 % Gel (Voltaren)  ?diphenhydrAMINE 25 MG tablet (BENADRYL)  ?OVER THE COUNTER MEDICATION  ?oxyCODONE-acetaminophen 5-325 MG tablet (PERCOCET/ROXICET)  ?pravastatin 80 MG tablet (PRAVACHOL)  ? ?Medication changes verified by the patient? Yes ?  ? ?Medication Accessibility: ? ?Home Pharmacy: Switching to [facility] ? ?Was the patient provided with refills on discharged medications? Yes  ? ?Have all prescriptions been transferred from [facility] to home pharmacy? Yes ? ?Is the patient able to afford medications? Has insurance ?Eliquis is $45 a month ?  ? ?Medication Review: ? ?APIXABAN (ELIQUIS)  ?Apixaban 5 mg BID initiated on 11/17/21.  ?- Discussed importance of taking medication around the same time everyday  ?- Advised patient of medications to avoid (NSAIDs, ASA)  ?- Educated that Tylenol (acetaminophen) will be the preferred analgesic to prevent risk of bleeding  ?- Emphasized importance of monitoring for signs and symptoms of bleeding (abnormal bruising, prolonged bleeding, nose bleeds, bleeding from gums, discolored urine, black tarry stools)  ?- Advised patient to alert all providers of anticoagulation therapy prior to starting a new medication or having a procedure  ? ? ?Follow-up Appointments: ? ?PCP Hospital f/u appt confirmed? Scheduled to see *** on *** @ ***.  ? ?[facility] f/u appt confirmed? Scheduled to see Dr. Burt Knack on 11/29/21 @ 3:40pm.  ? ?If their condition worsens, is the pt aware to call PCP or go to the Emergency Dept.? Yes ? ?Final Patient Assessment: ?*** ? ?

## 2021-11-24 NOTE — Telephone Encounter (Signed)
? ?  Telephone encounter was:  Successful.  ?11/24/2021 ?Name: DAYSEN GUNDRUM MRN: [id number] DOB: 1960/08/23 ? ?JAYION SCHNECK is a 61 y.o. year old male who is a primary care patient of [name], MD . The community resource team was consulted for assistance with Transportation Needs  ? ?Care guide performed the following interventions: motivcare  ? ?[phone] ?24 one way rides ,  ?Within  1 week up to 72 hours , called with patient established his [employer] transportation. ? ?Follow Up Plan:  No further follow up planned at this time. The patient has been provided with needed resources. ? ?Alois Cliche -Berneda Rose ?Care Guide , Embedded Care Coordination ?[place], Care Management  ?[phone] ?[address] [place] , [place] [address] ?Email : [email or web address] ?  ?

## 2021-11-27 LAB — PROTHROMBIN GENE MUTATION

## 2021-11-27 NOTE — Progress Notes (Signed)
Internal Medicine Clinic Attending ° °Case discussed with Dr. DeMaio  At the time of the visit.  We reviewed the resident’s history and exam and pertinent patient test results.  I agree with the assessment, diagnosis, and plan of care documented in the resident’s note. ° ° °

## 2021-11-29 ENCOUNTER — Encounter: Payer: Self-pay | Admitting: Cardiovascular Disease

## 2021-11-29 ENCOUNTER — Ambulatory Visit: Payer: Medicare HMO

## 2021-11-29 ENCOUNTER — Other Ambulatory Visit (INDEPENDENT_AMBULATORY_CARE_PROVIDER_SITE_OTHER): Payer: Medicare HMO

## 2021-11-29 ENCOUNTER — Ambulatory Visit (INDEPENDENT_AMBULATORY_CARE_PROVIDER_SITE_OTHER): Payer: Medicare HMO | Admitting: Cardiovascular Disease

## 2021-11-29 VITALS — BP 110/72 | HR 88 | Ht 72.0 in | Wt 198.8 lb

## 2021-11-29 DIAGNOSIS — Z01810 Encounter for preprocedural cardiovascular examination: Secondary | ICD-10-CM

## 2021-11-29 DIAGNOSIS — E782 Mixed hyperlipidemia: Secondary | ICD-10-CM

## 2021-11-29 DIAGNOSIS — E1142 Type 2 diabetes mellitus with diabetic polyneuropathy: Secondary | ICD-10-CM

## 2021-11-29 DIAGNOSIS — I253 Aneurysm of heart: Secondary | ICD-10-CM | POA: Diagnosis not present

## 2021-11-29 DIAGNOSIS — Z794 Long term (current) use of insulin: Secondary | ICD-10-CM

## 2021-11-29 DIAGNOSIS — E1165 Type 2 diabetes mellitus with hyperglycemia: Secondary | ICD-10-CM | POA: Diagnosis not present

## 2021-11-29 DIAGNOSIS — Q2112 Patent foramen ovale: Secondary | ICD-10-CM

## 2021-11-29 LAB — COMPREHENSIVE METABOLIC PANEL
ALT: 61 U/L — ABNORMAL HIGH (ref 0–53)
AST: 52 U/L — ABNORMAL HIGH (ref 0–37)
Albumin: 4.6 g/dL (ref 3.5–5.2)
Alkaline Phosphatase: 57 U/L (ref 39–117)
BUN: 13 mg/dL (ref 6–23)
CO2: 28 mEq/L (ref 19–32)
Calcium: 9.9 mg/dL (ref 8.4–10.5)
Chloride: 99 mEq/L (ref 96–112)
Creatinine, Ser: 0.94 mg/dL (ref 0.40–1.50)
GFR: 88.96 mL/min (ref >60.00)
Glucose, Bld: 146 mg/dL — ABNORMAL HIGH (ref 70–99)
Potassium: 4 mEq/L (ref 3.5–5.1)
Sodium: 138 mEq/L (ref 135–145)
Total Bilirubin: 0.4 mg/dL (ref 0.2–1.2)
Total Protein: 7.4 g/dL (ref 6.0–8.3)

## 2021-11-29 LAB — LIPID PANEL
Cholesterol: 136 mg/dL (ref 0–200)
HDL: 53.8 mg/dL (ref >39.00)
LDL Cholesterol: 67 mg/dL (ref 0–99)
NonHDL: 82.33
Total CHOL/HDL Ratio: 3
Triglycerides: 79 mg/dL (ref 0.0–149.0)
VLDL: 15.8 mg/dL (ref 0.0–40.0)

## 2021-11-29 LAB — HEMOGLOBIN A1C: Hgb A1c MFr Bld: 7.2 % — ABNORMAL HIGH (ref 4.6–6.5)

## 2021-11-29 NOTE — Progress Notes (Signed)
?Cardiology Office Note:   ? ?Date:  12/02/2021  ? ?ID:  Gregory May, DOB 1961-01-20, MRN [id number] ? ?PCP:  Timothy Lasso, MD ?  ?[facility] Providers ?Cardiologist:  None    ? ?Referring MD: Timothy Lasso, MD  ? ?Chief Complaint  ?Patient presents with  ? PFO  ? ? ?History of Present Illness:   ? ?Gregory May is a 61 y.o. male referred by Dr Leonie Man for evaluation of PFO.  ? ?The patient was recently hospitalized with sudden loss of vision in the right eye. 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  CTA study showed no evidence of carotid occlusive disease.  The patient was noted to have aortic atherosclerosis involving the aortic arch. The patient's hypercoagulable panel is negative and notably absent for lupus anticoagulant, factor V Leiden mutation, and prothrombin gene mutation.  Protein C and protein S are normal. ? ?The patient is here alone today.  He continues to have vision troubles with the right eye.  He is tolerating apixaban without bleeding problems.  He reports good compliance with his medication.  He denies chest pain, chest pressure, shortness of breath, edema, orthopnea, or PND. ? ?Past Medical History:  ?Diagnosis Date  ? 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 Cutaneous abscess of left foot   ? Deep vein thrombosis (DVT) (HCC)   ? Diabetes mellitus   ? type II  ? 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 Right heel  ? DJD (degenerative joint disease)   ? DVT (deep venous thrombosis) ([place]) 02/08/2014  ? Proximal provoked. Date of diagnosis February 08 2014 Duration of anticoagulation: 6 months. End date 08/12/2014.  Anticoagulant: Lovenox 120 units daily Switched to Eliquis on 05/25/2014     ? Ear drum perforation, right 03/06/2019  ? Non-pressure chronic ulcer of right calf, limited to breakdown of skin ([place]) 04/17/2017  ? Pulmonary emboli ([place]) 02/08/2014  ? Date of diagnosis February 08 2014, on chest CTA Hospitalized for  3 days Had some symptoms of shortness of breath, and chest pain With intercurrent DVT of the left LE. Duration of anticoagulation: 8 months. End date 10/11/2014.  Anticoagulant: Lovenox 120 units daily Switched to Eliquis on 05/25/2014 per patient preference   ? Pulmonary embolism ([place])   ? Sebaceous cyst   ? on back of neck  ? ? ?Past Surgical History:  ?Procedure Laterality Date  ? AMPUTATION Right 06/03/2015  ? Procedure: Right Below Knee Amputation;  Surgeon: [name], MD;  Location: [place];  Service: Orthopedics;  Laterality: Right;  ? AMPUTATION Left 07/24/2019  ? Procedure: LEFT FOOT 3RD RAY AMPUTATION;  Surgeon: [name], MD;  Location: [place];  Service: Orthopedics;  Laterality: Left;  ? AMPUTATION Left 06/23/2021  ? Procedure: LEFT 2ND TOE AMPUTATION;  Surgeon: [name], MD;  Location: [place];  Service: Orthopedics;  Laterality: Left;  ? AMPUTATION Right 08/21/2021  ? Procedure: AMPUTATION RIGHT INDEX FINGER;  Surgeon: Orene Desanctis, MD;  Location: [place];  Service: Orthopedics;  Laterality: Right;  ? CLOSED REDUCTION WITH HUMER PIN INSERTION  1974  ? left hip  ? HARDWARE REMOVAL Left 07/21/2014  ? Procedure: HARDWARE REMOVAL;  Surgeon: Ninetta Lights, MD;  Location: [place];  Service: Orthopedics;  Laterality: Left;  ? I & D EXTREMITY Right 08/21/2021  ? Procedure: IRRIGATION AND DEBRIDEMENT RIGHT INDEX FINGER;  Surgeon: Orene Desanctis, MD;  Location: [place];  Service: Orthopedics;  Laterality: Right;  ? ROTATOR CUFF REPAIR Right 2005 (approx)  ? TOTAL HIP ARTHROPLASTY Left 07/21/2014  ? Procedure: TOTAL HIP ARTHROPLASTY ANTERIOR APPROACH;  Surgeon: Ninetta Lights, MD;  Location: [place];  Service: Orthopedics;  Laterality: Left;  ? TOTAL HIP ARTHROPLASTY Right 2006 (approx)  ? right hip replaced  ? ? ?Current Medications: ?Current Meds  ?Medication Sig  ? Accu-Chek FastClix Lancets MISC Use Accu Chek Fastclix lancets to check blood sugar three times daily. DX:E11.65  ? ACCU-CHEK GUIDE test strip TEST  BLOOD SUGAR THREE TIMES DAILY AS DIRECTED  ? acetaminophen (TYLENOL) 500 MG tablet Take 1,000 mg by mouth at bedtime as needed for mild pain (pain).  ? apixaban (ELIQUIS) 5 MG TABS tablet Take 1 tablet (5 mg total) by mouth 2 (two) times daily.  ? empagliflozin (JARDIANCE) 25 MG TABS tablet Take 1 tablet (25 mg total) by mouth daily.  ? fluticasone (FLONASE) 50 MCG/ACT nasal spray Place 2 sprays into both nostrils at bedtime as needed (sinus drainage).  ? gabapentin (NEURONTIN) 300 MG capsule TAKE 1 CAPSULE FOUR TIMES DAILY AS NEEDED (Patient taking differently: Take 600 mg by mouth 2 (two) times daily.)  ? insulin aspart (NOVOLOG) 100 UNIT/ML injection Inject 16-20 Units into the skin in the morning and at bedtime.  ? insulin degludec (TRESIBA FLEXTOUCH) 100 UNIT/ML FlexTouch Pen Inject 6 Units into the skin at bedtime.  ? Insulin Syringes, Disposable, U-100 0.5 ML MISC Use to inject insulin  ? metFORMIN (GLUCOPHAGE-XR) 750 MG 24 hr tablet TAKE 2 TABLETS EVERY DAY (Patient taking differently: Take 750 mg by mouth daily.)  ? Multiple Vitamin (MULTIVITAMIN WITH MINERALS) TABS tablet Take 1 tablet by mouth every morning. Centrum  ? pioglitazone (ACTOS) 30 MG tablet TAKE 1 TABLET EVERY DAY (Patient taking differently: Take 30 mg by mouth every other day.)  ? rosuvastatin (CRESTOR) 20 MG tablet Take 1 tablet (20 mg total) by mouth daily.  ?  ? ?Allergies:   Patient has no known allergies.  ? ?Social History  ? ?Socioeconomic History  ? Marital status: Single  ?  Spouse name: Not on file  ? Number of children: 0  ? Years of education: Not on file  ? Highest education level: Not on file  ?Occupational History  ? Occupation: disabled  ?Tobacco Use  ? Smoking status: Never  ? Smokeless tobacco: Never  ?Vaping Use  ? Vaping Use: Never used  ?Substance and Sexual Activity  ? Alcohol use: Not Currently  ?  Comment: beer and mixed drink maybe 5  times a month  ? Drug use: No  ? Sexual activity: Never  ?Other Topics Concern  ?  Not on file  ?Social History Narrative  ? Not on file  ? ?Social Determinants of Health  ? ?Financial Resource Strain: Not on file  ?Food Insecurity: Not on file  ?Transportation Needs: No Transportation Needs  ? Lack of Transportation (Medical): No  ? Lack of Transportation (Non-Medical): No  ?Physical Activity: Not on file  ?Stress: Not on file  ?Social Connections: Not on file  ?  ? ?Family History: ?The patient's family history includes Breast cancer in his mother; Cancer in his mother; Diabetes in his brother, brother, father, mother, paternal grandfather, and paternal grandmother; Heart Problems in his maternal grandmother; Hypertension in his maternal grandmother; Liver cancer in his mother. ? ?ROS:   ?Please see the history of present illness.    ?All other systems reviewed  and are negative. ? ?EKGs/Labs/Other Studies Reviewed:   ? ?The following studies were reviewed today: ?MRI Brain: ?IMPRESSION: ?Multiple small 3-5 mm areas of acute infarct in the right MCA ?territory. Findings suggests emboli. Review of the CTA reveals no ?source in the aortic arch or right carotid. ?  ?Atrophy and chronic ischemic changes. ? ?CTA Chest: ?AORTIC ARCH: ?  ?There is calcific atherosclerosis of the aortic arch. There is no ?aneurysm, dissection or hemodynamically significant stenosis of the ?visualized portion of the aorta. Conventional 3 vessel aortic ?branching pattern. The visualized proximal subclavian arteries are ?widely patent. ?  ?RIGHT CAROTID SYSTEM: No dissection, occlusion or aneurysm. Mild ?atherosclerotic calcification at the carotid bifurcation without ?hemodynamically significant stenosis. ?  ?LEFT CAROTID SYSTEM: No dissection, occlusion or aneurysm. Mild ?atherosclerotic calcification at the carotid bifurcation without ?hemodynamically significant stenosis. ?  ?VERTEBRAL ARTERIES: Left dominant configuration. Both origins are ?clearly patent. There is no dissection, occlusion or flow-limiting ?stenosis to  the skull base (V1-V3 segments). ?  ?CTA HEAD FINDINGS ?  ?POSTERIOR CIRCULATION: ?  ?--Vertebral arteries: Normal V4 segments. ?  ?--Inferior cerebellar arteries: Normal. ?  ?--Basilar artery: Normal.

## 2021-11-29 NOTE — Patient Instructions (Signed)
Medication Instructions:  ?Your physician recommends that you continue on your current medications as directed. Please refer to the Current Medication list given to you today. ? ?*If you need a refill on your cardiac medications before your next appointment, please call your pharmacy* ? ? ?Lab Work: ?BMET, CBC on Friday 01/19/22 ?If you have labs (blood work) drawn today and your tests are completely normal, you will receive your results only by: ?MyChart Message (if you have MyChart) OR ?A paper copy in the mail ?If you have any lab test that is abnormal or we need to change your treatment, we will call you to review the results. ? ? ?Testing/Procedures: ?Transesophageal Echocardiogram ?Your physician has requested that you have a TEE. During a TEE, sound waves are used to create images of your heart. It provides your doctor with information about the size and shape of your heart and how well your heart?s chambers and valves are working. In this test, a transducer is attached to the end of a flexible tube that?s guided down your throat and into your esophagus (the tube leading from you mouth to your stomach) to get a more detailed image of your heart. You are not awake for the procedure. Please see the instruction sheet given to you today. For further information please visit [email or web address] ? ?Follow-Up: ?At [facility], you and your health needs are our priority.  As part of our continuing mission to provide you with exceptional heart care, we have created designated Provider Care Teams.  These Care Teams include your primary Cardiologist (physician) and Advanced Practice Providers (APPs -  Physician Assistants and Nurse Practitioners) who all work together to provide you with the care you need, when you need it. ? ?We recommend signing up for the patient portal called "MyChart".  Sign up information is provided on this After Visit Summary.  MyChart is used to connect with patients for Virtual Visits  (Telemedicine).  Patients are able to view lab/test results, encounter notes, upcoming appointments, etc.  Non-urgent messages can be sent to your provider as well.   ?To learn more about what you can do with MyChart, go to [email or web address].   ? ?Your next appointment:   ?Friday, January 19, 2022 at 1pm ? ?The format for your next appointment:   ?In Person ? ?Provider:   ?Georgie Chard, NP ?Carlean Jews, NP ? ?Other Instructions ? ?You are scheduled for a TEE on Wednesday, January 24, 2022 with Dr. Eden Emms.  Please arrive at the [facility] (Main Entrance A) at [facility]: [address] [place], [place] [address] at 1:00 pm. (1 hour prior to procedure) ? ?DIET: Nothing to eat or drink after midnight except a sip of water with medications (see medication instructions below) ? ?FYI: For your safety, and to allow [place] to monitor your vital signs accurately during the surgery/procedure we request that   ?if you have artificial nails, gel coating, SNS etc. Please have those removed prior to your surgery/procedure. Not having the nail coverings /polish removed may result in cancellation or delay of your surgery/procedure. ? ? ?Medication Instructions: ?Continue your anticoagulant: Eliquis ?You will need to continue your anticoagulant after your procedure until you  are told by your Provider that it is safe to stop ? ? ?Labs: To be done 01/19/22 at visit ? ?For patients receiving anesthesia for TEE and all Cardioversion patients: BMET, CBC within 1 week ? ?Come to the lab at [facility] between the hours of 7:30  am and 4:45 pm. You do not have to be fasting. ? ?You must have a responsible person to drive you home and stay in the waiting area during your procedure. Failure to do so could result in cancellation. ? ?Physiological scientist cards. ? ?*Special Note: Every effort is made to have your procedure done on time. Occasionally there are emergencies that occur at the hospital that may cause delays. Please  be patient if a delay does occur.  ? ? ?Important Information About Sugar ? ? ? ? ?  ?

## 2021-12-04 ENCOUNTER — Telehealth: Payer: Self-pay

## 2021-12-04 ENCOUNTER — Other Ambulatory Visit: Payer: Medicare HMO

## 2021-12-04 NOTE — Telephone Encounter (Signed)
Patient called regarding paperwork he received in the mail. ?

## 2021-12-04 NOTE — Telephone Encounter (Signed)
I have already spoke with patient and discussed what NovoNordisk needed. Paperwork sent and has been updated I another encounter. ?

## 2021-12-05 ENCOUNTER — Other Ambulatory Visit: Payer: Self-pay

## 2021-12-05 MED ORDER — GABAPENTIN 300 MG PO CAPS
300.0000 mg | ORAL_CAPSULE | Freq: Four times a day (QID) | ORAL | 2 refills | Status: DC
Start: 2021-12-05 — End: 2022-02-09

## 2021-12-07 ENCOUNTER — Encounter: Payer: Self-pay | Admitting: Endocrinology

## 2021-12-07 ENCOUNTER — Ambulatory Visit (INDEPENDENT_AMBULATORY_CARE_PROVIDER_SITE_OTHER): Payer: Medicare HMO | Admitting: Endocrinology

## 2021-12-07 VITALS — BP 122/86 | HR 94 | Ht 72.0 in | Wt 199.4 lb

## 2021-12-07 DIAGNOSIS — Z794 Long term (current) use of insulin: Secondary | ICD-10-CM

## 2021-12-07 DIAGNOSIS — E1165 Type 2 diabetes mellitus with hyperglycemia: Secondary | ICD-10-CM | POA: Diagnosis not present

## 2021-12-07 DIAGNOSIS — R7989 Other specified abnormal findings of blood chemistry: Secondary | ICD-10-CM | POA: Diagnosis not present

## 2021-12-07 DIAGNOSIS — E1142 Type 2 diabetes mellitus with diabetic polyneuropathy: Secondary | ICD-10-CM | POA: Diagnosis not present

## 2021-12-07 DIAGNOSIS — E782 Mixed hyperlipidemia: Secondary | ICD-10-CM | POA: Diagnosis not present

## 2021-12-07 MED ORDER — INSULIN ASPART 100 UNIT/ML IJ SOLN: INTRAMUSCULAR | 0 refills | Status: DC

## 2021-12-07 MED ORDER — TRESIBA FLEXTOUCH 100 UNIT/ML ~~LOC~~ SOPN: PEN_INJECTOR | SUBCUTANEOUS | 0 refills | Status: DC

## 2021-12-07 NOTE — Patient Instructions (Addendum)
Take Actos daily ? ?

## 2021-12-07 NOTE — Progress Notes (Signed)
Gregory May 61 y.o. ?       ? ? ? ?Reason for Appointment: follow-up  ? ?History of Present Illness  ? ?Diagnosis: Type 2 DIABETES MELITUS, date of diagnosis:  1999    ? ?Previous history: He has previously been treated with metformin and Victoza and subsequently mealtime insulin added to control postprandial hyperglycemia ?Overall he has been  relatively noncompliant with his diet, medications, monitoring and followup ?Previously would not take Victoza regularly because of cost. ?Has not been taking any medications for the last year and a half because of commitments to family and cost ?A1c had increased to 12.3% and hyperglycemia discovered when he was hospitalized for foot ulcer. Also lost 20 pounds because of hyperglycemia  ? ?For better control, compliance and inconvenience he was switched to the V-go pump on 02/21/16 ? ?Recent history:  ? ?Insulin regimen:  ? ?CURRENTLY: TRESIBA 6 units hs  and NovoLog 16 -20 units before meals ? ?Non-insulin hypoglycemic drugs: Metformin ER 1500 mg daily , Actos 30 mg every other day, Jardiance 25mg  daily   ? ?A1c  is 7.2 compared to 7.5 on his last visit ? ?Current diabetes management, blood sugar patterns and problems identified. ? ?He still not started using the freestyle libre because of intercurrent medical issues ? ?Again not able to download his meter today  ?Despite using only small doses of Tresiba his fasting readings are still excellent  ?His highest reading was 221 when he forgot his mealtime insulin in the evening  ?Previously Actos was changed to every other day because of edema  ?Has been able to take Jardiance consistently without change in renal function  ?Overall he has been usually trying to watch his diet and not getting into excessive sweets ?Recent weight is about the same ?Despite taking relatively large doses of NovoLog he has not had any hypoglycemia ? ?Has breakfast around 10-11 AM and dinner at 8 PM ? ? Side effects from medications: None ?       ?Glucometer:  Accu-Chek guide ?         ?Blood Glucose readings by review of monitor ? ? ?PRE-MEAL Fasting Lunch Dinner Bedtime Overall  ?Glucose range: 105-131      ?Mean/median:     133  ? ?POST-MEAL PC Breakfast PC Lunch PC Dinner  ?Glucose range:   118-221  ?Mean/median:     ? ?Previously: ? ?PRE-MEAL Fasting Lunch Dinner Bedtime Overall  ?Glucose range: 90-203 139, 146 94    ?Mean/median:     ?  ? ?POST-MEAL PC Breakfast PC Lunch PC Dinner  ?Glucose range:   65-200  ?Mean/median:     ? ? ? ?Meals:  usually 2 meals per day usually.  breakfast at 12 noon, evening meal 7-9 pm, variable snacks late at night   ? ?Physical activity: exercise: Minimal  ?             ?Weight control:  ? ?Wt Readings from Last 3 Encounters:  ?12/07/21 199 lb 6.4 oz (90.4 kg)  ?11/29/21 198 lb 12.8 oz (90.2 kg)  ?11/23/21 202 lb (91.6 kg)  ?      ?  ?Diabetes labs: ? ?Lab Results  ?Component Value Date  ? HGBA1C 7.2 (H) 11/29/2021  ? HGBA1C 6.8 (H) 11/16/2021  ? HGBA1C 7.5 (H) 08/31/2021  ? ?Lab Results  ?Component Value Date  ? MICROALBUR 4.2 (H) 02/23/2021  ? [place] 67 11/29/2021  ? CREATININE 0.94 11/29/2021  ? ? ?Lab Results  ?  Component Value Date  ? FRUCTOSAMINE 245 09/19/2020  ? FRUCTOSAMINE 272 03/03/2019  ? FRUCTOSAMINE 243 03/07/2017  ? ? ? ?Other active problems: See review of systems  ? ? ?No visits with results within 1 Week(s) from this visit.  ?Latest known visit with results is:  ?Lab on 11/29/2021  ?Component Date Value Ref Range Status  ? Cholesterol 11/29/2021 136  0 - 200 mg/dL Final  ? ATP III Classification       Desirable:  < 200 mg/dL               Borderline High:  200 - 239 mg/dL          High:  > = 240 mg/dL  ? Triglycerides 11/29/2021 79.0  0.0 - 149.0 mg/dL Final  ? Normal:  <150 mg/dLBorderline High:  150 - 199 mg/dL  ? HDL 11/29/2021 53.80  >39.00 mg/dL Final  ? VLDL 11/29/2021 15.8  0.0 - 40.0 mg/dL Final  ? LDL Cholesterol 11/29/2021 67  0 - 99 mg/dL Final  ? Total CHOL/HDL Ratio 11/29/2021 3   Final  ?                 Men          Women1/2 Average Risk     3.4          3.3Average Risk          5.0          4.42X Average Risk          9.6          7.13X Average Risk          15.0          11.0                      ? NonHDL 11/29/2021 82.33   Final  ? NOTE:  Non-HDL goal should be 30 mg/dL higher than patient's LDL goal (i.e. LDL goal of < 70 mg/dL, would have non-HDL goal of < 100 mg/dL)  ? Sodium 11/29/2021 138  135 - 145 mEq/L Final  ? Potassium 11/29/2021 4.0  3.5 - 5.1 mEq/L Final  ? Chloride 11/29/2021 99  96 - 112 mEq/L Final  ? CO2 11/29/2021 28  19 - 32 mEq/L Final  ? Glucose, Bld 11/29/2021 146 (H)  70 - 99 mg/dL Final  ? BUN 11/29/2021 13  6 - 23 mg/dL Final  ? Creatinine, Ser 11/29/2021 0.94  0.40 - 1.50 mg/dL Final  ? Total Bilirubin 11/29/2021 0.4  0.2 - 1.2 mg/dL Final  ? Alkaline Phosphatase 11/29/2021 57  39 - 117 U/L Final  ? AST 11/29/2021 52 (H)  0 - 37 U/L Final  ? ALT 11/29/2021 61 (H)  0 - 53 U/L Final  ? Total Protein 11/29/2021 7.4  6.0 - 8.3 g/dL Final  ? Albumin 11/29/2021 4.6  3.5 - 5.2 g/dL Final  ? GFR 11/29/2021 88.96  >60.00 mL/min Final  ? Calculated using the CKD-EPI Creatinine Equation (2021)  ? Calcium 11/29/2021 9.9  8.4 - 10.5 mg/dL Final  ? Hgb A1c MFr Bld 11/29/2021 7.2 (H)  4.6 - 6.5 % Final  ? Glycemic Control Guidelines for People with Diabetes:Non Diabetic:  <6%Goal of Therapy: <7%Additional Action Suggested:  >8%   ? ? ? ?Allergies as of 12/07/2021   ?No Known Allergies ?  ? ?  ?Medication List  ?  ? ?  ? Accurate  as of December 07, 2021 11:59 PM. If you have any questions, ask your nurse or doctor.  ?  ?  ? ?  ? ?Accu-Chek FastClix Lancets Misc ?Use Accu Chek Fastclix lancets to check blood sugar three times daily. DX:E11.65 ?  ?Accu-Chek Guide test strip ?Generic drug: glucose blood ?TEST BLOOD SUGAR THREE TIMES DAILY AS DIRECTED ?  ?acetaminophen 500 MG tablet ?Commonly known as: TYLENOL ?Take 1,000 mg by mouth at bedtime as needed for mild pain (pain). ?  ?apixaban 5 MG  Tabs tablet ?Commonly known as: ELIQUIS ?Take 1 tablet (5 mg total) by mouth 2 (two) times daily. ?  ?empagliflozin 25 MG Tabs tablet ?Commonly known as: Jardiance ?Take 1 tablet (25 mg total) by mouth daily. ?  ?fluticasone 50 MCG/ACT nasal spray ?Commonly known as: FLONASE ?Place 2 sprays into both nostrils at bedtime as needed (sinus drainage). ?  ?gabapentin 300 MG capsule ?Commonly known as: NEURONTIN ?Take 1 capsule (300 mg total) by mouth in the morning, at noon, in the evening, and at bedtime. TAKE 1 CAPSULE FOUR TIMES DAILY AS NEEDED Strength: 300 mg ?  ?insulin aspart 100 UNIT/ML injection ?Commonly known as: novoLOG ?Inject 16-20 Units into the skin in the morning and at bedtime. ?What changed: Another medication with the same name was added. Make sure you understand how and when to take each. ?Changed by: Elayne Snare, MD ?  ?insulin aspart 100 UNIT/ML injection ?Commonly known as: novoLOG ?Inject 16-20 units twice a day (Max daily dose 40 units) ?What changed: You were already taking a medication with the same name, and this prescription was added. Make sure you understand how and when to take each. ?Changed by: Elayne Snare, MD ?  ?Insulin Syringes (Disposable) U-100 0.5 ML Misc ?Use to inject insulin ?  ?metFORMIN 750 MG 24 hr tablet ?Commonly known as: GLUCOPHAGE-XR ?TAKE 2 TABLETS EVERY DAY ?What changed: how much to take ?  ?multivitamin with minerals Tabs tablet ?Take 1 tablet by mouth every morning. Centrum ?  ?pioglitazone 30 MG tablet ?Commonly known as: ACTOS ?TAKE 1 TABLET EVERY DAY ?What changed: when to take this ?  ?rosuvastatin 20 MG tablet ?Commonly known as: CRESTOR ?Take 1 tablet (20 mg total) by mouth daily. ?  ?Tyler Aas FlexTouch 100 UNIT/ML FlexTouch Pen ?Generic drug: insulin degludec ?Inject up to 12 units at bedtime ?What changed:  ?how much to take ?how to take this ?when to take this ?additional instructions ?Changed by: Elayne Snare, MD ?  ? ?  ? ? ?Allergies: No Known  Allergies ? ?Past Medical History:  ?Diagnosis Date  ? 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 Cutaneous abscess of left foot   ? Deep vein thrombosis (DVT) (HCC)   ? Diabetes mellitus   ? type II  ? Diabetic ulcer of heel

## 2021-12-14 ENCOUNTER — Telehealth: Payer: Self-pay | Admitting: Cardiovascular Disease

## 2021-12-14 NOTE — Telephone Encounter (Signed)
Patient is requesting to reschedule 6/09 lab work and structural APP appointment due to a scheduling conflict. He will need both appointments on the same day again if possible. ?

## 2021-12-14 NOTE — Telephone Encounter (Signed)
Called to inform patient that patient assistance came in. 1 box of [place] and 5 vials of Novolog. Patient notified and will come to pick up. ?

## 2021-12-15 ENCOUNTER — Telehealth: Payer: Medicare HMO

## 2021-12-15 ENCOUNTER — Ambulatory Visit (INDEPENDENT_AMBULATORY_CARE_PROVIDER_SITE_OTHER): Payer: Medicare HMO | Admitting: Student in an Organized Health Care Education/Training Program

## 2021-12-15 ENCOUNTER — Encounter: Payer: Self-pay | Admitting: Student in an Organized Health Care Education/Training Program

## 2021-12-15 DIAGNOSIS — Z0000 Encounter for general adult medical examination without abnormal findings: Secondary | ICD-10-CM

## 2021-12-15 NOTE — Progress Notes (Signed)
? ?Subjective:  ? Gregory May is a 61 y.o. male who presents for an Initial Medicare Annual Wellness Visit. ?I connected with  Gregory May on 12/15/21 by a audio enabled telemedicine application and verified that I am speaking with the correct person using two identifiers. ? ?Patient Location: Home ? ?Provider Location: Office/Clinic ? ?I discussed the limitations of evaluation and management by telemedicine. The patient expressed understanding and agreed to proceed.  ?Review of Systems    ?Defer to pcp ?  ? ?   ?Objective:  ?  ?There were no vitals filed for this visit. ?There is no height or weight on file to calculate BMI. ? ? ?  12/15/2021  ?  2:48 PM 11/23/2021  ?  3:19 PM 11/17/2021  ?  3:16 AM 11/15/2021  ?  4:54 PM 10/26/2021  ?  4:31 PM 08/29/2021  ?  4:10 PM 08/19/2021  ?  1:00 PM  ?Advanced Directives  ?Does Patient Have a Medical Advance Directive? No No  No No No No  ?Would patient like information on creating a medical advance directive? No - Patient declined No - Patient declined No - Patient declined  No - Patient declined No - Patient declined No - Patient declined  ? ? ?Current Medications (verified) ?Outpatient Encounter Medications as of 12/15/2021  ?Medication Sig  ? Accu-Chek FastClix Lancets MISC Use Accu Chek Fastclix lancets to check blood sugar three times daily. DX:E11.65  ? ACCU-CHEK GUIDE test strip TEST BLOOD SUGAR THREE TIMES DAILY AS DIRECTED  ? acetaminophen (TYLENOL) 500 MG tablet Take 1,000 mg by mouth at bedtime as needed for mild pain (pain).  ? apixaban (ELIQUIS) 5 MG TABS tablet Take 1 tablet (5 mg total) by mouth 2 (two) times daily.  ? empagliflozin (JARDIANCE) 25 MG TABS tablet Take 1 tablet (25 mg total) by mouth daily.  ? fluticasone (FLONASE) 50 MCG/ACT nasal spray Place 2 sprays into both nostrils at bedtime as needed (sinus drainage).  ? gabapentin (NEURONTIN) 300 MG capsule Take 1 capsule (300 mg total) by mouth in the morning, at noon, in the evening, and at  bedtime. TAKE 1 CAPSULE FOUR TIMES DAILY AS NEEDED Strength: 300 mg  ? insulin aspart (NOVOLOG) 100 UNIT/ML injection Inject 16-20 Units into the skin in the morning and at bedtime.  ? insulin aspart (NOVOLOG) 100 UNIT/ML injection Inject 16-20 units twice a day (Max daily dose 40 units)  ? insulin degludec (TRESIBA FLEXTOUCH) 100 UNIT/ML FlexTouch Pen Inject up to 12 units at bedtime  ? Insulin Syringes, Disposable, U-100 0.5 ML MISC Use to inject insulin  ? metFORMIN (GLUCOPHAGE-XR) 750 MG 24 hr tablet TAKE 2 TABLETS EVERY DAY (Patient taking differently: Take 750 mg by mouth daily.)  ? Multiple Vitamin (MULTIVITAMIN WITH MINERALS) TABS tablet Take 1 tablet by mouth every morning. Centrum  ? pioglitazone (ACTOS) 30 MG tablet TAKE 1 TABLET EVERY DAY (Patient taking differently: Take 30 mg by mouth every other day.)  ? rosuvastatin (CRESTOR) 20 MG tablet Take 1 tablet (20 mg total) by mouth daily.  ? ?No facility-administered encounter medications on file as of 12/15/2021.  ? ? ?Allergies (verified) ?Patient has no known allergies.  ? ?History: ?Past Medical History:  ?Diagnosis Date  ? 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 Cutaneous abscess of left foot   ? Deep vein thrombosis (DVT) (HCC)   ? Diabetes mellitus   ? type II  ? 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 Right  heel  ? DJD (degenerative joint disease)   ? DVT (deep venous thrombosis) ([place]) 02/08/2014  ? Proximal provoked. Date of diagnosis February 08 2014 Duration of anticoagulation: 6 months. End date 08/12/2014.  Anticoagulant: Lovenox 120 units daily Switched to Eliquis on 05/25/2014     ? Ear drum perforation, right 03/06/2019  ? Non-pressure chronic ulcer of right calf, limited to breakdown of skin ([place]) 04/17/2017  ? Pulmonary emboli ([place]) 02/08/2014  ? Date of diagnosis February 08 2014, on chest CTA Hospitalized for 3 days Had some symptoms of shortness of breath, and chest pain With intercurrent DVT of the left LE. Duration of anticoagulation: 8 months. End date 10/11/2014.   Anticoagulant: Lovenox 120 units daily Switched to Eliquis on 05/25/2014 per patient preference   ? Pulmonary embolism ([place])   ? Sebaceous cyst   ? on back of neck  ? ?Past Surgical History:  ?Procedure Laterality Date  ? AMPUTATION Right 06/03/2015  ? Procedure: Right Below Knee Amputation;  Surgeon: [name], MD;  Location: [place];  Service: Orthopedics;  Laterality: Right;  ? AMPUTATION Left 07/24/2019  ? Procedure: LEFT FOOT 3RD RAY AMPUTATION;  Surgeon: [name], MD;  Location: [place];  Service: Orthopedics;  Laterality: Left;  ? AMPUTATION Left 06/23/2021  ? Procedure: LEFT 2ND TOE AMPUTATION;  Surgeon: [name], MD;  Location: [place];  Service: Orthopedics;  Laterality: Left;  ? AMPUTATION Right 08/21/2021  ? Procedure: AMPUTATION RIGHT INDEX FINGER;  Surgeon: Orene Desanctis, MD;  Location: [place];  Service: Orthopedics;  Laterality: Right;  ? CLOSED REDUCTION WITH HUMER PIN INSERTION  1974  ? left hip  ? HARDWARE REMOVAL Left 07/21/2014  ? Procedure: HARDWARE REMOVAL;  Surgeon: Ninetta Lights, MD;  Location: [place];  Service: Orthopedics;  Laterality: Left;  ? I & D EXTREMITY Right 08/21/2021  ? Procedure: IRRIGATION AND DEBRIDEMENT RIGHT INDEX FINGER;  Surgeon: Orene Desanctis, MD;  Location: [place];  Service: Orthopedics;  Laterality: Right;  ? ROTATOR CUFF REPAIR Right 2005 (approx)  ? TOTAL HIP ARTHROPLASTY Left 07/21/2014  ? Procedure: TOTAL HIP ARTHROPLASTY ANTERIOR APPROACH;  Surgeon: Ninetta Lights, MD;  Location: [place];  Service: Orthopedics;  Laterality: Left;  ? TOTAL HIP ARTHROPLASTY Right 2006 (approx)  ? right hip replaced  ? ?Family History  ?Problem Relation Age of Onset  ? Breast cancer Mother   ? Cancer Mother   ?     small intestine  ? Liver cancer Mother   ? Diabetes Mother   ? Diabetes Father   ? Diabetes Brother   ? Hypertension Maternal Grandmother   ? Heart Problems Maternal Grandmother   ? Diabetes Paternal Grandmother   ? Diabetes Paternal Grandfather   ? Diabetes Brother    ? ?Social History  ? ?Socioeconomic History  ? Marital status: Single  ?  Spouse name: Not on file  ? Number of children: 0  ? Years of education: Not on file  ? Highest education level: Not on file  ?Occupational History  ? Occupation: disabled  ?Tobacco Use  ? Smoking status: Never  ? Smokeless tobacco: Never  ?Vaping Use  ? Vaping Use: Never used  ?Substance and Sexual Activity  ? Alcohol use: Not Currently  ?  Comment: beer and mixed drink maybe 5  times a month  ? Drug use: No  ? Sexual activity: Never  ?Other Topics Concern  ? Not on file  ?Social History Narrative  ? Not on  file  ? ?Social Determinants of Health  ? ?Financial Resource Strain: Low Risk   ? Difficulty of Paying Living Expenses: Not hard at all  ?Food Insecurity: No Food Insecurity  ? Worried About Charity fundraiser in the Last Year: Never true  ? Ran Out of Food in the Last Year: Never true  ?Transportation Needs: Unmet Transportation Needs  ? Lack of Transportation (Medical): Yes  ? Lack of Transportation (Non-Medical): Yes  ?Physical Activity: Inactive  ? Days of Exercise per Week: 0 days  ? Minutes of Exercise per Session: 0 min  ?Stress: No Stress Concern Present  ? Feeling of Stress : Not at all  ?Social Connections: Socially Isolated  ? Frequency of Communication with Friends and Family: More than three times a week  ? Frequency of Social Gatherings with Friends and Family: More than three times a week  ? Attends Religious Services: Never  ? Active Member of Clubs or Organizations: No  ? Attends Archivist Meetings: Never  ? Marital Status: Never married  ? ? ?Tobacco Counseling ?Counseling given: Not Answered ? ? ?Clinical Intake: ? ?Pre-visit preparation completed: Yes ? ?Pain : No/denies pain ? ?  ? ?Nutritional Risks: None ?Diabetes: Yes ? ?How often do you need to have someone help you when you read instructions, pamphlets, or other written materials from your doctor or pharmacy?: 1 - Never ?What is the last grade level  you completed in school?: college 1 year ? ?Diabetic?Yes ? ?Interpreter Needed?: No ? ?Information entered by :: Midge Minium 12/15/21 2:38pm ? ? ?Activities of Daily Living ? ?  12/15/2021  ?  2:47 PM 4/13/2

## 2021-12-15 NOTE — Progress Notes (Deleted)
Subjective:   Gregory May is a 61 y.o. male who presents for an Initial Medicare Annual Wellness Visit. I connected with  [name] on 12/15/21 by a audio enabled telemedicine application and verified that I am speaking with the correct person using two identifiers.  Patient Location: Home  Provider Location: Office/Clinic  I discussed the limitations of evaluation and management by telemedicine. The patient expressed understanding and agreed to proceed.  Review of Systems    Defer to pcp       Objective:    There were no vitals filed for this visit. There is no height or weight on file to calculate BMI.     11/23/2021    3:19 PM 11/17/2021    3:16 AM 11/15/2021    4:54 PM 10/26/2021    4:31 PM 08/29/2021    4:10 PM 08/19/2021    1:00 PM 08/17/2021    3:23 PM  Advanced Directives  Does Patient Have a Medical Advance Directive? No  No No No No No  Would patient like information on creating a medical advance directive? No - Patient declined No - Patient declined  No - Patient declined No - Patient declined No - Patient declined No - Patient declined    Current Medications (verified) Outpatient Encounter Medications as of 12/15/2021  Medication Sig   Accu-Chek FastClix Lancets MISC Use Accu Chek Fastclix lancets to check blood sugar three times daily. DX:E11.65   ACCU-CHEK GUIDE test strip TEST BLOOD SUGAR THREE TIMES DAILY AS DIRECTED   acetaminophen (TYLENOL) 500 MG tablet Take 1,000 mg by mouth at bedtime as needed for mild pain (pain).   apixaban (ELIQUIS) 5 MG TABS tablet Take 1 tablet (5 mg total) by mouth 2 (two) times daily.   empagliflozin (JARDIANCE) 25 MG TABS tablet Take 1 tablet (25 mg total) by mouth daily.   fluticasone (FLONASE) 50 MCG/ACT nasal spray Place 2 sprays into both nostrils at bedtime as needed (sinus drainage).   gabapentin (NEURONTIN) 300 MG capsule Take 1 capsule (300 mg total) by mouth in the morning, at noon, in the evening, and at  bedtime. TAKE 1 CAPSULE FOUR TIMES DAILY AS NEEDED Strength: 300 mg   insulin aspart (NOVOLOG) 100 UNIT/ML injection Inject 16-20 Units into the skin in the morning and at bedtime.   insulin aspart (NOVOLOG) 100 UNIT/ML injection Inject 16-20 units twice a day (Max daily dose 40 units)   insulin degludec (TRESIBA FLEXTOUCH) 100 UNIT/ML FlexTouch Pen Inject up to 12 units at bedtime   Insulin Syringes, Disposable, U-100 0.5 ML MISC Use to inject insulin   metFORMIN (GLUCOPHAGE-XR) 750 MG 24 hr tablet TAKE 2 TABLETS EVERY DAY (Patient taking differently: Take 750 mg by mouth daily.)   Multiple Vitamin (MULTIVITAMIN WITH MINERALS) TABS tablet Take 1 tablet by mouth every morning. Centrum   pioglitazone (ACTOS) 30 MG tablet TAKE 1 TABLET EVERY DAY (Patient taking differently: Take 30 mg by mouth every other day.)   rosuvastatin (CRESTOR) 20 MG tablet Take 1 tablet (20 mg total) by mouth daily.   No facility-administered encounter medications on file as of 12/15/2021.    Allergies (verified) Patient has no known allergies.   History: Past Medical History:  Diagnosis Date   Arthritis    bilateral hips   Cutaneous abscess of left foot    Deep vein thrombosis (DVT) (HCC)    Diabetes mellitus    type II   Diabetic ulcer of heel (HCC)    Right  heel   DJD (degenerative joint disease)    DVT (deep venous thrombosis) (HCC) 02/08/2014   Proximal provoked. Date of diagnosis February 08 2014 Duration of anticoagulation: 6 months. End date 08/12/2014.  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 Duration of anticoagulation: 8 months. End date 10/11/2014.   Anticoagulant: Lovenox 120 units daily Switched to Eliquis on 05/25/2014 per patient preference    Pulmonary embolism (HCC)    Sebaceous cyst    on back of neck   Past Surgical History:  Procedure Laterality Date   AMPUTATION Right 06/03/2015   Procedure: Right Below Knee Amputation;  Surgeon: Nadara Mustard, MD;  Location: [facility];  Service: Orthopedics;  Laterality: Right;   AMPUTATION Left 07/24/2019   Procedure: LEFT FOOT 3RD RAY AMPUTATION;  Surgeon: Nadara Mustard, MD;  Location: [facility] OR;  Service: Orthopedics;  Laterality: Left;   AMPUTATION Left 06/23/2021   Procedure: LEFT 2ND TOE AMPUTATION;  Surgeon: Nadara Mustard, MD;  Location[facility] OR;  Service: Orthopedics;  Laterality: Left;   AMPUTATION Right 08/21/2021   Procedure: AMPUTATION RIGHT INDEX FINGER;  Surgeon: Gomez Cleverly, MD;  Location: [facility] OR;  Service: Orthopedics;  Laterality: Right;   CLOSED REDUCTION WITH HUMER PIN INSERTION  1974   left hip   HARDWARE REMOVAL Left 07/21/2014   Procedure: HARDWARE REMOVAL;  Surgeon: Loreta Ave, MD;  Location: [facility] OR;  Service: Orthopedics;  Laterality: Left;   I & D EXTREMITY Right 08/21/2021   Procedure: IRRIGATION AND DEBRIDEMENT RIGHT INDEX FINGER;  Surgeon: Gomez Cleverly, MD;  Location: [facility] OR;  Service: Orthopedics;  Laterality: Right;   ROTATOR CUFF REPAIR Right 2005 (approx)   TOTAL HIP ARTHROPLASTY Left 07/21/2014   Procedure: TOTAL HIP ARTHROPLASTY ANTERIOR APPROACH;  Surgeon: Loreta Ave, MD;  Location: [facility] OR;  Service: Orthopedics;  Laterality: Left;   TOTAL HIP ARTHROPLASTY Right 2006 (approx)   right hip replaced   Family History  Problem Relation Age of Onset   Breast cancer Mother    Cancer Mother        small intestine   Liver cancer Mother    Diabetes Mother    Diabetes Father    Diabetes Brother    Hypertension Maternal Grandmother    Heart Problems Maternal Grandmother    Diabetes Paternal Grandmother    Diabetes Paternal Grandfather    Diabetes Brother     Social History   Socioeconomic History   Marital status: Single    Spouse name: Not on file   Number of children: 0   Years of education: Not on file   Highest education level: Not on file  Occupational History   Occupation: disabled  Tobacco Use   Smoking status: Never   Smokeless tobacco: Never  Vaping Use   Vaping Use: Never used  Substance and Sexual Activity   Alcohol use: Not Currently    Comment: beer and mixed drink maybe 5  times a month   Drug use: No   Sexual activity: Never  Other Topics Concern   Not on file  Social History Narrative   Not on  file   Social Determinants of Health   Financial Resource Strain: Not on file  Food Insecurity: Not on file  Transportation Needs: No Transportation Needs   Lack of Transportation (Medical): No   Lack of Transportation (Non-Medical): No  Physical Activity: Not on file  Stress: Not on file  Social Connections: Not on file    Tobacco Counseling Counseling given: Not Answered   Clinical Intake:                 Diabetic?Yes         Activities of Daily Living    11/23/2021    2:46 PM 11/17/2021    3:05 AM  In your present state of health, do you have any difficulty performing the following activities:  Hearing? 0   Vision? 1   Difficulty concentrating or making decisions? 0   Walking or climbing stairs? 1   Dressing or bathing? 0   Doing errands, shopping? 1 0    Patient Care Team: [name], MD as PCP - General  Indicate any recent Medical Services you may have received from other than [facility] providers in the past year (date may be approximate).     Assessment:   This is a routine wellness examination for Jacory.  Hearing/Vision screen No results found.  Dietary issues and exercise activities discussed:     Goals Addressed   None   Depression Screen    11/23/2021    2:47 PM 10/26/2021    4:32 PM 08/29/2021    4:09 PM 08/18/2021    9:00 AM 07/15/2019    3:07 PM 03/06/2019     9:52 AM 02/25/2019   11:08 AM  PHQ 2/9 Scores  PHQ - 2 Score 1 0 0 1 0 0 0  PHQ- 9 Score 2 2 2 4 2       Fall Risk    11/23/2021    2:46 PM 10/26/2021    4:32 PM 08/29/2021    4:08 PM 08/17/2021    3:22 PM 04/20/2020    3:21 PM  Fall Risk   Falls in the past year? 0 0 0 0 0  Number falls in past yr: 0   0 0  Injury with Fall? 0   0 0  Risk for fall due to : Impaired balance/gait;Impaired mobility No Fall Risks Impaired balance/gait Impaired balance/gait;Impaired mobility Impaired balance/gait;Other (Comment);Impaired mobility  Follow up Falls evaluation completed Falls evaluation completed Falls evaluation completed Falls evaluation completed Falls evaluation completed    FALL RISK PREVENTION PERTAINING TO THE HOME:  Any stairs in or around the home? {YES/NO:21197} If so, are there any without handrails? {YES/NO:21197} Home free of loose throw rugs in walkways, pet beds, electrical cords, etc? {YES/NO:21197} Adequate lighting in your home to reduce risk of falls? {YES/NO:21197}  ASSISTIVE DEVICES UTILIZED TO PREVENT FALLS:  Life alert? {YES/NO:21197} Use of a cane, walker or w/c? {YES/NO:21197} Grab bars in the bathroom? {YES/NO:21197} Shower chair or bench in shower? {YES/NO:21197} Elevated toilet seat or a handicapped toilet? {YES/NO:21197}  TIMED UP AND GO:  Was the test performed? {YES/NO:21197}.  Length of time to ambulate 10 feet: *** sec.   {Appearance of ZOXW:9604540}  Cognitive Function:        Immunizations Immunization History  Administered Date(s) Administered   Influenza,inj,Quad PF,6+ Mos 07/22/2014, 06/11/2015, 07/20/2016, 06/20/2017, 05/30/2018, 05/15/2019, 06/23/2020, 05/29/2021   Pneumococcal Polysaccharide-23 07/22/2014   Tetanus 11/05/2013    TDAP status: Up to date  Flu Vaccine status: Up to date  {  Pneumococcal vaccine status:2101807}  {Covid-19 vaccine status:2101808}  Qualifies for Shingles Vaccine? {YES/NO:21197}  Zostavax completed  {YES/NO:21197}  {Shingrix Completed?:2101804}  Screening Tests Health Maintenance  Topic Date Due   COVID-19 Vaccine (1) Never done   COLONOSCOPY (Pts 45-6yrs Insurance coverage will need to be confirmed)  Never done   Zoster Vaccines- Shingrix (1 of 2) Never done   OPHTHALMOLOGY EXAM  07/12/2021   URINE MICROALBUMIN  02/23/2022   HEMOGLOBIN A1C  02/28/2022   INFLUENZA VACCINE  03/13/2022   FOOT EXAM  11/24/2022   LIPID PANEL  11/30/2022   TETANUS/TDAP  11/06/2023   Hepatitis C Screening  Completed   HIV Screening  Completed   HPV VACCINES  Aged Out    Health Maintenance  Health Maintenance Due  Topic Date Due   COVID-19 Vaccine (1) Never done   COLONOSCOPY (Pts 45-38yrs Insurance coverage will need to be confirmed)  Never done   Zoster Vaccines- Shingrix (1 of 2) Never done   OPHTHALMOLOGY EXAM  07/12/2021    {Colorectal cancer screening:2101809}  Lung Cancer Screening: (Low Dose CT Chest recommended if Age 77-80 years, 30 pack-year currently smoking OR have quit w/in 15years.) {DOES NOT does:27190::"does not"} qualify.   Lung Cancer Screening Referral: ***  Additional Screening:  Hepatitis C Screening: does not qualify; Completed 04/20/2020  Vision Screening: Recommended annual ophthalmology exams for early detection of glaucoma and other disorders of the eye. Is the patient up to date with their annual eye exam?  {YES/NO:21197} Who is the provider or what is the name of the office in which the patient attends annual eye exams? *** If pt is not established with a provider, would they like to be referred to a provider to establish care? {YES/NO:21197}.   Dental Screening: Recommended annual dental exams for proper oral hygiene  Community Resource Referral / Chronic Care Management: CRR required this visit?  {YES/NO:21197}  CCM required this visit?  {YES/NO:21197}     Plan:     I have personally reviewed and noted the following in the patient's chart:    Medical and social history Use of alcohol, tobacco or illicit drugs  Current medications and supplements including opioid prescriptions. {Opioid [phone]} Functional ability and status Nutritional status Physical activity Advanced directives List of other physicians Hospitalizations, surgeries, and ER visits in previous 12 months Vitals Screenings to include cognitive, depression, and falls Referrals and appointments  In addition, I have reviewed and discussed with patient certain preventive protocols, quality metrics, and best practice recommendations. A written personalized care plan for preventive services as well as general preventive health recommendations were provided to patient.     Cala Bradford, CMA   12/15/2021   Nurse Notes: Non Face-to-Face minute visit  Mr. Wages , Thank you for taking time to come for your Medicare Wellness Visit. I appreciate your ongoing commitment to your health goals. Please review the following plan we discussed and let me know if I can assist you in the future.   These are the goals we discussed:  Goals      HEMOGLOBIN A1C < 7.0        This is a list of the screening recommended for you and due dates:  Health Maintenance  Topic Date Due   COVID-19 Vaccine (1) Never done   Colon Cancer Screening  Never done   Zoster (Shingles) Vaccine (1 of 2) Never done   Eye exam for diabetics  07/12/2021   Urine Protein Check  02/23/2022   Hemoglobin  A1C  02/28/2022   Flu Shot  03/13/2022   Complete foot exam   11/24/2022   Lipid (cholesterol) test  11/30/2022   Tetanus Vaccine  11/06/2023   Hepatitis C Screening: USPSTF Recommendation to screen - Ages 18-79 yo.  Completed   HIV Screening  Completed   HPV Vaccine  Aged Out

## 2021-12-15 NOTE — Telephone Encounter (Signed)
Please disregard previous message, pt completed awv. ?

## 2021-12-15 NOTE — Telephone Encounter (Signed)
Called patient x3 and lvm to complete awv, please reschedule pt. ?

## 2021-12-15 NOTE — Telephone Encounter (Signed)
Returned call to patient who states that he cannot make current appt on 1:00pm 01/19/22 due to transportation issues. Rescheduled OV w/structual APP and need labwork appt for 01/17/22 @ 3:30pm. States his transportation will be available and this will still be in the 7 day window prior to TEE. ?

## 2021-12-17 ENCOUNTER — Ambulatory Visit: Payer: Medicare HMO

## 2021-12-18 ENCOUNTER — Other Ambulatory Visit (HOSPITAL_COMMUNITY): Payer: Self-pay

## 2021-12-18 ENCOUNTER — Other Ambulatory Visit: Payer: Self-pay | Admitting: *Deleted

## 2021-12-18 NOTE — Telephone Encounter (Signed)
Pt req a call back. ? ?Pt states he can not afford the following med and only has 3 days left. ? ?apixaban (ELIQUIS) 5 MG TABS tablet ? ?[facility] (Ph: [phone] ?

## 2021-12-18 NOTE — Progress Notes (Signed)
I discussed the AWV findings with the provider who conducted the visit. I was present in the office suite and immediately available to provide assistance and direction throughout the time the service was provided.  ?

## 2021-12-18 NOTE — Progress Notes (Unsigned)
Call from patient unable to afford Eliquis that he is on.  Got assistance the first time from a coupon.  Unable to pay $130 per month .  Spoke with C. [facility] Tech.  No samples are available for this medication.  Will be able to send patient Patient assistance forms from [employer].  Will need to complete and send in   Patient stated that he only has 3 days worth left and will be unable to afford the medication.  Is asking if the medication can be changed to something covered or less expensive.  Patient can get 30 days worth for $42.of the Eliquis.   ?

## 2021-12-19 ENCOUNTER — Telehealth: Payer: Self-pay | Admitting: *Deleted

## 2021-12-19 NOTE — Telephone Encounter (Signed)
Call to patient informed patient that Dr. Ruben Im will be starting Coumadin for him.  Patient unable to afford Eliquis.  Patient will need to come in on Friday for labs.  Patient stated that he would be unable to come in on Friday for lab work.  Dr. Ruben Im was consulted patient can come in on Thursday for the INR but must not take an Elliquis on Wednesday.  Patient had further questions and was transferred  to Dr. Ruben Im.   ?

## 2021-12-20 ENCOUNTER — Telehealth: Payer: Self-pay

## 2021-12-20 NOTE — Telephone Encounter (Signed)
Returned call after a voicemail was left requesting medication assistance. ? ?Call back [phone] ?

## 2021-12-21 ENCOUNTER — Other Ambulatory Visit: Payer: Self-pay | Admitting: Internal Medicine

## 2021-12-21 ENCOUNTER — Other Ambulatory Visit (INDEPENDENT_AMBULATORY_CARE_PROVIDER_SITE_OTHER): Payer: Medicare HMO

## 2021-12-21 DIAGNOSIS — Z7901 Long term (current) use of anticoagulants: Secondary | ICD-10-CM | POA: Diagnosis not present

## 2021-12-21 DIAGNOSIS — Z5181 Encounter for therapeutic drug level monitoring: Secondary | ICD-10-CM | POA: Diagnosis not present

## 2021-12-21 LAB — PROTIME-INR
INR: 1 (ref 0.8–1.2)
Prothrombin Time: 12.8 seconds (ref 11.4–15.2)

## 2021-12-21 NOTE — Addendum Note (Signed)
Addended by: Bufford Spikes on: 12/21/2021 11:50 AM ? ? Modules accepted: Orders ? ?

## 2021-12-22 NOTE — Telephone Encounter (Signed)
I left a VM for this person this morning. This is a high risk issue of a person with an embolic stroke one month ago, now out of Apixaban and unable to afford copay in the future. Dr. Jeanice Lim and I have asked him to come to [facility] today to pick up samples of Xarelto 20mg , can give him #14 to start. He needs to call Rosendo Gros, she has left him a VM as well, to investigate assistance programs.  ? ?Can one of today's attendings help follow up on this? I am worried with clinic closing early today that he may not be able to easily access [facility] in the afternoon. ?

## 2021-12-27 ENCOUNTER — Telehealth: Payer: Self-pay

## 2021-12-27 NOTE — Telephone Encounter (Signed)
Left second message requesting call back regarding PAP (for eliquis). ?

## 2021-12-27 NOTE — Telephone Encounter (Signed)
Created in error

## 2022-01-01 ENCOUNTER — Telehealth: Payer: Self-pay

## 2022-01-01 ENCOUNTER — Other Ambulatory Visit (HOSPITAL_COMMUNITY): Payer: Self-pay

## 2022-01-01 NOTE — Telephone Encounter (Signed)
Please call pt back about apixaban (ELIQUIS) 5 MG TABS tablet.

## 2022-01-01 NOTE — Telephone Encounter (Signed)
Spoke with pt regarding patient assistance for blood thinner medications.  Pt does not think he would be eligible for patient assistance for Eliquis due to not having spent 3% of his income on out of pocket medication costs.  Pt informed me he is currently taking the xarelto samples provided from the office. This medication would cost him $45 for a 30 day supply at the pharmacy (same as eliquis).  Pt says coumadin/warfarin should be covered for him and would like to see about taking that.  If he needs more xarelto samples, we have more. He says he will be on Wednesday for his f/u on his bloodwork.

## 2022-01-03 ENCOUNTER — Encounter: Payer: Medicare HMO | Admitting: Internal Medicine

## 2022-01-04 ENCOUNTER — Ambulatory Visit (INDEPENDENT_AMBULATORY_CARE_PROVIDER_SITE_OTHER): Payer: Medicare HMO | Admitting: Internal Medicine

## 2022-01-04 ENCOUNTER — Encounter: Payer: Self-pay | Admitting: Internal Medicine

## 2022-01-04 ENCOUNTER — Other Ambulatory Visit: Payer: Self-pay

## 2022-01-04 DIAGNOSIS — I10 Essential (primary) hypertension: Secondary | ICD-10-CM

## 2022-01-04 DIAGNOSIS — H3411 Central retinal artery occlusion, right eye: Secondary | ICD-10-CM

## 2022-01-04 MED ORDER — RIVAROXABAN 20 MG PO TABS: 20.0000 mg | ORAL_TABLET | Freq: Every day | ORAL | 0 refills | Status: DC

## 2022-01-04 NOTE — Assessment & Plan Note (Addendum)
Patient could no longer afford the co-pays for Eliquis.  Patient was seen in office a few weeks ago and given Xarelto 20 mg daily samples, 2 weeks worth.  He has been compliant with this medication.  In the meantime, I have been working with Gregory May  the pharmacist to determine financial assistance for Gregory May.  Per Gregory May, patient has not spent enough out-of-pocket cost to qualify for financial assistance.  Patient was instructed to reach out to social security administration regarding extra help/low income subsidy to help with his co-pay. During visit, patient was given additional Xarelto 20mg  samples, 2 weeks quantity. He believes his co-pay will be less with Xarelto, per his insurance company. Will prescribe Xarelto. Patient instructed to call the office if co-pay for new prescription is not attainable.   P: -Xarelto 20mg  daily -Follow up in 3 months

## 2022-01-04 NOTE — Patient Instructions (Signed)
Thank you, [name] for allowing [place] to provide your care today.  I have ordered the following medication/changed the following medications:   Stop the following medications: Medications Discontinued During This Encounter  Medication Reason   apixaban (ELIQUIS) 5 MG TABS tablet      Start the following medications: Meds ordered this encounter  Medications   rivaroxaban (XARELTO) 20 MG TABS tablet    Sig: Take 1 tablet (20 mg total) by mouth daily with supper.    Dispense:  90 tablet    Refill:  0     Follow up: 3 months    Should you have any questions or concerns please call the internal medicine clinic at [phone].    [name], MD [facility]

## 2022-01-09 ENCOUNTER — Telehealth: Payer: Self-pay | Admitting: Internal Medicine

## 2022-01-09 NOTE — Assessment & Plan Note (Signed)
BP well controlled 109/67. Patient is asymptomatic

## 2022-01-09 NOTE — Progress Notes (Signed)
CC: medication adjustment; BP check  HPI:  [name] is a 61 y.o. male with a past medical history stated below and presents today for cc listed above. Please see problem based assessment and plan for additional details.  Past Medical History:  Diagnosis Date   Arthritis    bilateral hips   Cutaneous abscess of left foot    Deep vein thrombosis (DVT) (HCC)    Diabetes mellitus    type II   Diabetic ulcer of heel (HCC)    Right heel   DJD (degenerative joint disease)    DVT (deep venous thrombosis) ([place]) 02/08/2014   Proximal provoked. Date of diagnosis February 08 2014 Duration of anticoagulation: 6 months. End date 08/12/2014.  Anticoagulant: Lovenox 120 units daily Switched to Eliquis on 05/25/2014      Ear drum perforation, right 03/06/2019   Non-pressure chronic ulcer of right calf, limited to breakdown of skin ([place]) 04/17/2017   Pulmonary emboli ([place]) 02/08/2014   Date of diagnosis February 08 2014, on chest CTA Hospitalized for 3 days Had some symptoms of shortness of breath, and chest pain With intercurrent DVT of the left LE. Duration of anticoagulation: 8 months. End date 10/11/2014.  Anticoagulant: Lovenox 120 units daily Switched to Eliquis on 05/25/2014 per patient preference    Pulmonary embolism ([place])    Sebaceous cyst    on back of neck    Current Outpatient Medications on File Prior to Visit  Medication Sig Dispense Refill   Accu-Chek FastClix Lancets MISC Use Accu Chek Fastclix lancets to check blood sugar three times daily. DX:E11.65 300 each 2   ACCU-CHEK GUIDE test strip TEST BLOOD SUGAR THREE TIMES DAILY AS DIRECTED 300 strip 3   acetaminophen (TYLENOL) 500 MG tablet Take 1,000 mg by mouth at bedtime as needed for mild pain (pain).     empagliflozin (JARDIANCE) 25 MG TABS tablet Take 1 tablet (25 mg total) by mouth daily. 90 tablet 3   fluticasone (FLONASE) 50 MCG/ACT nasal spray Place 2 sprays into both nostrils at bedtime as needed (sinus drainage). 9.9 mL  1   gabapentin (NEURONTIN) 300 MG capsule Take 1 capsule (300 mg total) by mouth in the morning, at noon, in the evening, and at bedtime. TAKE 1 CAPSULE FOUR TIMES DAILY AS NEEDED Strength: 300 mg 360 capsule 2   insulin aspart (NOVOLOG) 100 UNIT/ML injection Inject 16-20 Units into the skin in the morning and at bedtime.     insulin aspart (NOVOLOG) 100 UNIT/ML injection Inject 16-20 units twice a day (Max daily dose 40 units) 20 mL 0   insulin degludec (TRESIBA FLEXTOUCH) 100 UNIT/ML FlexTouch Pen Inject up to 12 units at bedtime 30 mL 0   Insulin Syringes, Disposable, U-100 0.5 ML MISC Use to inject insulin 100 each 2   metFORMIN (GLUCOPHAGE-XR) 750 MG 24 hr tablet TAKE 2 TABLETS EVERY DAY (Patient taking differently: Take 750 mg by mouth daily.) 180 tablet 2   Multiple Vitamin (MULTIVITAMIN WITH MINERALS) TABS tablet Take 1 tablet by mouth every morning. Centrum     pioglitazone (ACTOS) 30 MG tablet TAKE 1 TABLET EVERY DAY (Patient taking differently: Take 30 mg by mouth every other day.) 90 tablet 1   rosuvastatin (CRESTOR) 20 MG tablet Take 1 tablet (20 mg total) by mouth daily. 90 tablet 1   No current facility-administered medications on file prior to visit.    Family History  Problem Relation Age of Onset   Breast cancer Mother  Cancer Mother        small intestine   Liver cancer Mother    Diabetes Mother    Diabetes Father    Diabetes Brother    Hypertension Maternal Grandmother    Heart Problems Maternal Grandmother    Diabetes Paternal Grandmother    Diabetes Paternal Grandfather    Diabetes Brother     Social History   Socioeconomic History   Marital status: Single    Spouse name: Not on file   Number of children: 0   Years of education: Not on file   Highest education level: Not on file  Occupational History   Occupation: disabled  Tobacco Use   Smoking status: Never   Smokeless tobacco: Never  Vaping Use   Vaping Use: Never used  Substance and Sexual  Activity   Alcohol use: Not Currently    Comment: beer and mixed drink maybe 5  times a month   Drug use: No   Sexual activity: Never  Other Topics Concern   Not on file  Social History Narrative   Not on file   Social Determinants of Health   Financial Resource Strain: Low Risk    Difficulty of Paying Living Expenses: Not hard at all  Food Insecurity: No Food Insecurity   Worried About Charity fundraiser in the Last Year: Never true   Arboriculturist in the Last Year: Never true  Transportation Needs: Public librarian (Medical): Yes   Lack of Transportation (Non-Medical): Yes  Physical Activity: Inactive   Days of Exercise per Week: 0 days   Minutes of Exercise per Session: 0 min  Stress: No Stress Concern Present   Feeling of Stress : Not at all  Social Connections: Socially Isolated   Frequency of Communication with Friends and Family: More than three times a week   Frequency of Social Gatherings with Friends and Family: More than three times a week   Attends Religious Services: Never   Marine scientist or Organizations: No   Attends Music therapist: Never   Marital Status: Never married  Human resources officer Violence: Not At Risk   Fear of Current or Ex-Partner: No   Emotionally Abused: No   Physically Abused: No   Sexually Abused: No    Review of Systems: ROS negative except for what is noted on the assessment and plan.  Vitals:   01/04/22 1609  BP: 109/67  Pulse: 69  Temp: 98.2 F (36.8 C)  TempSrc: Oral  SpO2: 98%  Weight: 196 lb 6.4 oz (89.1 kg)  Height: 6' (1.829 m)     Physical Exam: Constitutional: normal-appearing elderly man sitting in the chair, in no acute distress HENT: normocephalic atraumatic, mucous membranes moist Eyes: conjunctiva non-erythematous Neck: supple Cardiovascular: regular rate and rhythm, no m/r/g Pulmonary/Chest: normal work of breathing on room air, lungs clear to  auscultation bilaterally Abdominal: soft, non-tender, non-distended MSK: normal bulk and tone Neurological: alert & oriented x 3, 5/5 strength in bilateral upper and lower extremities,  Skin: warm and dry Psych: normal mood; normal behavior   Assessment & Plan:   See Encounters Tab for problem based charting.  Patient discussed with Dr. Deretha Emory, M.D. [facility], PGY-1 Pager: [phone], Phone: [phone] Date 01/09/2022 Time 8:29 AM

## 2022-01-09 NOTE — Telephone Encounter (Signed)
Pt states the following medications are to high for him to pay for and needs someone to call him back about what to do.   Pt states Dr. Jeanice Lim asked him to call back about what his pharmacy said.  Pt states he has been getting samples for the past 4 weeks and he is afraid he will have another stroke if he is not able to get his medication.  Patient states both meds will be $135-$145 for 3 months.    The patient has checked both of the following pharmacies   Pt states he can not afford these because of his income on disability  [facility], [place] (Ph: [phone])   [place], [place] (Ph: [phone])            Eliquis  rivaroxaban (XARELTO) 20 MG TABS tablet

## 2022-01-09 NOTE — Progress Notes (Signed)
Internal Medicine Clinic Attending ? ?Case discussed with Dr. Ariwodo  At the time of the visit.  We reviewed the resident?s history and exam and pertinent patient test results.  I agree with the assessment, diagnosis, and plan of care documented in the resident?s note.  ?

## 2022-01-10 NOTE — Telephone Encounter (Signed)
Patient called back stating Xarelto is the same price as Eliquis at both [facility] and [place]. States he can get Plavix 75 mg at zero cost through [facility].

## 2022-01-15 ENCOUNTER — Encounter: Payer: Self-pay | Admitting: Neurology

## 2022-01-15 ENCOUNTER — Ambulatory Visit: Payer: Medicare HMO | Admitting: Neurology

## 2022-01-15 ENCOUNTER — Encounter (HOSPITAL_COMMUNITY): Payer: Self-pay | Admitting: Cardiovascular Disease

## 2022-01-15 VITALS — BP 118/77 | HR 73 | Ht 72.0 in | Wt 197.5 lb

## 2022-01-15 DIAGNOSIS — Z8673 Personal history of transient ischemic attack (TIA), and cerebral infarction without residual deficits: Secondary | ICD-10-CM

## 2022-01-15 DIAGNOSIS — E1142 Type 2 diabetes mellitus with diabetic polyneuropathy: Secondary | ICD-10-CM | POA: Diagnosis not present

## 2022-01-15 DIAGNOSIS — H349 Unspecified retinal vascular occlusion: Secondary | ICD-10-CM | POA: Diagnosis not present

## 2022-01-15 DIAGNOSIS — G4733 Obstructive sleep apnea (adult) (pediatric): Secondary | ICD-10-CM | POA: Diagnosis not present

## 2022-01-15 NOTE — Progress Notes (Addendum)
Chief Complaint  Patient presents with   New Patient (Initial Visit)    Rm 14. Accompanied by aunt, Bevely Palmer. hosp f/u d/c home 4/7.      ASSESSMENT AND PLAN  Gregory May is a 61 y.o. 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, 2021  Also evidence of embolic stroke  On Xarelto now,  PFO with evidence of right-to-left shunt, pending TEE, potential PFO closure procedure,  Bilateral lower extremity DVT, on anticoagulation  Hypercoagulable study was negative  Long history of diabetes, with diabetic complications, diabetic peripheral neuropathy, right BKA  Continue follow-up with cardiologist, return to clinic in 6 months High risk for obstructive sleep apnea  Patient complains of excessive daytime sleepiness, fatigue, high risk for obstructive sleep apnea, refer to sleep study   DIAGNOSTIC DATA (LABS, IMAGING, TESTING) - I reviewed patient records, labs, notes, testing and imaging myself where available.   MEDICAL HISTORY:  Gregory May is a 61 year old, seen in request by   Timothy Lasso, for evaluation of right retinal stroke, he is accompanied by his at today's visit on January 15, 2022  I reviewed and summarized the referring note. PMHX. DM,  HLD Stroke S/p right BKA in 2006 Diabetic peripheral neuropathy  He has been on disability due to his diabetic complications, right index finger amputation, left toes amputation and right BKA, long history of diabetic peripheral neuropathy, seeing Dr. Dwyane Dee endocrinologist, most recent A1c was 7.2,  He woke up November 15, 2021 noticed his room seems to be dimmer, was seen by ophthalmologist the same day, then referred to emergency room right retinal artery occlusion,  Personally reviewed MRI of brain multiple acute small DWI lesion involving right MCA territory, right head of caudate, right temporal, subependymoma temporal, right temporoparietal lobe, right posterior frontal cortex bilaterally, evidence of old  cerebellar stroke, bilateral thalamus, most consistent with embolic event,  CT angiogram of head and neck showed no large vessel disease, aortic atherosclerosis.  Echocardiogram showed aneurysm intra-atrial septum, agitated saline consistent with right-to-left shunting across the atrial septum  Doppler study showed bilateral lower extremity DVT, he was initially treated with Eliquis, now on Xarelto samples due to cost concerns Extensive laboratory evaluations, A1c 7.2, 101, CMP with elevated glucose 146, normal creatinine 0.94, mild elevated AST, ALT, negative antiphospholipid antibody and other hypercoagulable panel, normal ESR, C-reactive protein  He was seen by cardiologist Dr. Burt Knack, insurance does not approve for suggested 30 days cardiac monitoring, TEE is pending soon, if confirmed, may consider PFO closure,  He also had a history of pulm emboli DVT in 2015, took anticoagulation for 8 months at that time,  Patient complains of excessive daytime fatigue, sleepiness,     PHYSICAL EXAM:   Vitals:   01/15/22 1528  BP: 118/77  Pulse: 73  Weight: 197 lb 8 oz (89.6 kg)  Height: 6' (1.829 m)   Not recorded     Body mass index is 26.79 kg/m.  PHYSICAL EXAMNIATION:  Gen: NAD, conversant, well nourised, well groomed                     Cardiovascular: Regular rate rhythm, no peripheral edema, warm, nontender. Eyes: Conjunctivae clear without exudates or hemorrhage Neck: Supple, no carotid bruits. Pulmonary: Clear to auscultation bilaterally   NEUROLOGICAL EXAM:  MENTAL STATUS: Speech/cognition: Awake, alert, oriented to history taking and casual conversation CRANIAL NERVES: CN II: Visual fields are full to confrontation.  Right pupillary afferent defect, light sensitivity  only, right optic disc showed very thin stringed arterial CN III, IV, VI: extraocular movement are normal. No ptosis. CN V: Facial sensation is intact to light touch CN VII: Face is symmetric with  normal eye closure  CN VIII: Hearing is normal to causal conversation. CN IX, X: Phonation is normal. CN XI: Head turning and shoulder shrug are intact  MOTOR: Left index finger amputation, right BKA, no significant muscle weakness otherwise  REFLEXES: Reflexes are hypoactive and symmetric at the biceps, triceps, absent at knees, and ankles. Plantar responses are flexor.  SENSORY: Length-dependent decreased vibratory sensation light touch pinprick to left knee level  COORDINATION: There is no trunk or limb dysmetria noted.  GAIT/STANCE: Need push-up to get up from seated position, unsteady  REVIEW OF SYSTEMS:  Full 14 system review of systems performed and notable only for as above All other review of systems were negative.   ALLERGIES: No Known Allergies  HOME MEDICATIONS: Current Outpatient Medications  Medication Sig Dispense Refill   Accu-Chek FastClix Lancets MISC Use Accu Chek Fastclix lancets to check blood sugar three times daily. DX:E11.65 300 each 2   ACCU-CHEK GUIDE test strip TEST BLOOD SUGAR THREE TIMES DAILY AS DIRECTED 300 strip 3   acetaminophen (TYLENOL) 500 MG tablet Take 1,000 mg by mouth at bedtime as needed for mild pain (pain).     empagliflozin (JARDIANCE) 25 MG TABS tablet Take 1 tablet (25 mg total) by mouth daily. 90 tablet 3   fluticasone (FLONASE) 50 MCG/ACT nasal spray Place 2 sprays into both nostrils at bedtime as needed (sinus drainage). 9.9 mL 1   gabapentin (NEURONTIN) 300 MG capsule Take 1 capsule (300 mg total) by mouth in the morning, at noon, in the evening, and at bedtime. TAKE 1 CAPSULE FOUR TIMES DAILY AS NEEDED Strength: 300 mg 360 capsule 2   insulin aspart (NOVOLOG) 100 UNIT/ML injection Inject 16-20 Units into the skin in the morning and at bedtime.     insulin aspart (NOVOLOG) 100 UNIT/ML injection Inject 16-20 units twice a day (Max daily dose 40 units) 20 mL 0   insulin degludec (TRESIBA FLEXTOUCH) 100 UNIT/ML FlexTouch Pen Inject up  to 12 units at bedtime 30 mL 0   Insulin Syringes, Disposable, U-100 0.5 ML MISC Use to inject insulin 100 each 2   metFORMIN (GLUCOPHAGE-XR) 750 MG 24 hr tablet TAKE 2 TABLETS EVERY DAY (Patient taking differently: Take 750 mg by mouth daily.) 180 tablet 2   Multiple Vitamin (MULTIVITAMIN WITH MINERALS) TABS tablet Take 1 tablet by mouth every morning. Centrum     pioglitazone (ACTOS) 30 MG tablet TAKE 1 TABLET EVERY DAY (Patient taking differently: Take 30 mg by mouth every other day.) 90 tablet 1   rivaroxaban (XARELTO) 20 MG TABS tablet Take 1 tablet (20 mg total) by mouth daily with supper. 90 tablet 0   rosuvastatin (CRESTOR) 20 MG tablet Take 1 tablet (20 mg total) by mouth daily. 90 tablet 1   No current facility-administered medications for this visit.    PAST MEDICAL HISTORY: Past Medical History:  Diagnosis Date   Arthritis    bilateral hips   Cutaneous abscess of left foot    Deep vein thrombosis (DVT) (HCC)    Diabetes mellitus    type II   Diabetic ulcer of heel (HCC)    Right heel   DJD (degenerative joint disease)    DVT (deep venous thrombosis) ([place]) 02/08/2014   Proximal provoked. Date of diagnosis February 08 2014 Duration  of anticoagulation: 6 months. End date 08/12/2014.  Anticoagulant: Lovenox 120 units daily Switched to Eliquis on 05/25/2014      Ear drum perforation, right 03/06/2019   Non-pressure chronic ulcer of right calf, limited to breakdown of skin ([place]) 04/17/2017   Pulmonary emboli ([place]) 02/08/2014   Date of diagnosis February 08 2014, on chest CTA Hospitalized for 3 days Had some symptoms of shortness of breath, and chest pain With intercurrent DVT of the left LE. Duration of anticoagulation: 8 months. End date 10/11/2014.  Anticoagulant: Lovenox 120 units daily Switched to Eliquis on 05/25/2014 per patient preference    Pulmonary embolism ([place])    Sebaceous cyst    on back of neck    PAST SURGICAL HISTORY: Past Surgical History:  Procedure Laterality Date    AMPUTATION Right 06/03/2015   Procedure: Right Below Knee Amputation;  Surgeon: [name], MD;  Location: [facility];  Service: Orthopedics;  Laterality: Right;   AMPUTATION Left 07/24/2019   Procedure: LEFT FOOT 3RD RAY AMPUTATION;  Surgeon: [name], MD;  Location: [facility];  Service: Orthopedics;  Laterality: Left;   AMPUTATION Left 06/23/2021   Procedure: LEFT 2ND TOE AMPUTATION;  Surgeon: [name], MD;  Location: [facility];  Service: Orthopedics;  Laterality: Left;   AMPUTATION Right 08/21/2021   Procedure: AMPUTATION RIGHT INDEX FINGER;  Surgeon: Orene Desanctis, MD;  Location: [facility];  Service: Orthopedics;  Laterality: Right;   CLOSED REDUCTION WITH HUMER PIN INSERTION  1974   left hip   HARDWARE REMOVAL Left 07/21/2014   Procedure: HARDWARE REMOVAL;  Surgeon: Ninetta Lights, MD;  Location: [facility];  Service: Orthopedics;  Laterality: Left;   I & D EXTREMITY Right 08/21/2021   Procedure: IRRIGATION AND DEBRIDEMENT RIGHT INDEX FINGER;  Surgeon: Orene Desanctis, MD;  Location: [facility];  Service: Orthopedics;  Laterality: Right;   ROTATOR CUFF REPAIR Right 2005 (approx)   TOTAL HIP ARTHROPLASTY Left 07/21/2014   Procedure: TOTAL HIP ARTHROPLASTY ANTERIOR APPROACH;  Surgeon: Ninetta Lights, MD;  Location: [facility];  Service: Orthopedics;  Laterality: Left;   TOTAL HIP ARTHROPLASTY Right 2006 (approx)   right hip replaced    FAMILY HISTORY: Family History  Problem Relation Age of Onset   Breast cancer Mother    Cancer Mother        small intestine   Liver cancer Mother    Diabetes Mother    Diabetes Father    Diabetes Brother    Hypertension Maternal Grandmother    Heart Problems Maternal Grandmother    Diabetes Paternal Grandmother    Diabetes Paternal Grandfather    Diabetes Brother     SOCIAL HISTORY: Social History   Socioeconomic History   Marital status: Single    Spouse name: Not on file   Number of children: 0   Years of education: Not on file   Highest education level:  Not on file  Occupational History   Occupation: disabled  Tobacco Use   Smoking status: Never   Smokeless tobacco: Never  Vaping Use   Vaping Use: Never used  Substance and Sexual Activity   Alcohol use: Not Currently    Comment: beer and mixed drink maybe 5  times a month   Drug use: No   Sexual activity: Never  Other Topics Concern   Not on file  Social History Narrative   Not on file   Social Determinants of Health   Financial Resource Strain: Low Risk  Difficulty of Paying Living Expenses: Not hard at all  Food Insecurity: No Food Insecurity   Worried About Charity fundraiser in the Last Year: Never true   Ran Out of Food in the Last Year: Never true  Transportation Needs: Unmet Transportation Needs   Lack of Transportation (Medical): Yes   Lack of Transportation (Non-Medical): Yes  Physical Activity: Inactive   Days of Exercise per Week: 0 days   Minutes of Exercise per Session: 0 min  Stress: No Stress Concern Present   Feeling of Stress : Not at all  Social Connections: Socially Isolated   Frequency of Communication with Friends and Family: More than three times a week   Frequency of Social Gatherings with Friends and Family: More than three times a week   Attends Religious Services: Never   Marine scientist or Organizations: No   Attends Archivist Meetings: Never   Marital Status: Never married  Human resources officer Violence: Not At Risk   Fear of Current or Ex-Partner: No   Emotionally Abused: No   Physically Abused: No   Sexually Abused: No      Marcial Pacas, M.D. Ph.D.  [facility] [address], [place], [place] [address] Ph: [phone] Fax: [phone]  CC:  Timothy Lasso, MD [place],  [place] [address]  Timothy Lasso, MD

## 2022-01-16 ENCOUNTER — Telehealth: Payer: Self-pay

## 2022-01-16 NOTE — Progress Notes (Unsigned)
HE[facility] [place]                                     Cardiology Office Note:    Date:  01/17/2022   ID:  Gregory May, DOB Dec 20, 1960, MRN [id number]  PCP:  Gregory Lasso, MD  [facility] Cardiologist:  None  [facility] Electrophysiologist:  None   Referring MD: Gregory Lasso, MD   Set up TEE to evaluation PFO -01/24/22  History of Present Illness:    Gregory May is a 61 y.o. male with a hx of DVT/PE in 2015 treated with 8 months of [place], uncontrolled DMT2, PAD s/p R BKA, HTN, HLD, and recent CVA with diagnosis of a chronic DVT and PFO with atrial septal aneurysm who presents to clinic for follow up.    The patient was recently hospitalized in 11/2021 with sudden loss of vision in the right eye. 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  CTA study showed no evidence of carotid occlusive disease.  The patient was noted to have aortic atherosclerosis involving the aortic arch. The patient's hypercoagulable panel was negative and notably absent for lupus anticoagulant, factor V Leiden mutation, and prothrombin gene mutation.  Protein C and protein S were normal. Echo with bubble showed a PFO with an atrial septal aneurysm. The patient was also diagnosed with chronic lower extremity DVT during the same hospitalization. He has followed up with neurology and insurance did not approve the suggested 30 day cardiac monitoring.   He was seen in the office by Dr Gregory May on 12/02/21 for consideration of PFO closure. While his ROPE Score was notably low at 4, suggesting low probability of PFO-related stroke, the clinical scenario with suggestion of cardioembolic event, diagnosis of chronic DVT, and PFO with atrial septal aneurysm is concerning for the possibility of a PFO-related stroke. It was recommended that he continue on Eliquis and proceed with TEE to  further define his PFO anatomy, quantitate the size of PFO and shunt, and confirm the presence of atrial septal aneurysm.   Today the patient presents to clinic for follow up. Here alone. Still cant see out of his right eye. No CP or SOB. No LE edema, orthopnea or PND. No dizziness or syncope. No blood in stool or urine. No palpitations. He does have some pain and swelling behind left knee. Hurts with rest and exertion. Read PAD sign and wondered if it could be that.      Past Medical History:  Diagnosis Date   Arthritis    bilateral hips   Cutaneous abscess of left foot    Deep vein thrombosis (DVT) (HCC)    Diabetes mellitus    type II   Diabetic ulcer of heel (HCC)    Right heel   DJD (degenerative joint disease)    DVT (deep venous thrombosis) ([place]) 02/08/2014   Proximal provoked. Date of diagnosis February 08 2014 Duration of anticoagulation: 6 months. End date 08/12/2014.  Anticoagulant: Lovenox 120 units daily Switched to Eliquis on 05/25/2014      Ear drum perforation, right 03/06/2019   Non-pressure chronic ulcer of right calf, limited to breakdown of skin ([place]) 04/17/2017   Pulmonary emboli ([place]) 02/08/2014   Date of diagnosis February 08 2014, on chest CTA Hospitalized for 3 days Had some  symptoms of shortness of breath, and chest pain With intercurrent DVT of the left LE. Duration of anticoagulation: 8 months. End date 10/11/2014.  Anticoagulant: Lovenox 120 units daily Switched to Eliquis on 05/25/2014 per patient preference    Pulmonary embolism ([place])    Sebaceous cyst    on back of neck    Past Surgical History:  Procedure Laterality Date   AMPUTATION Right 06/03/2015   Procedure: Right Below Knee Amputation;  Surgeon: Gregory Minion, MD;  Location: [place];  Service: Orthopedics;  Laterality: Right;   AMPUTATION Left 07/24/2019   Procedure: LEFT FOOT 3RD RAY AMPUTATION;  Surgeon: Gregory Minion, MD;  Location: [place];  Service: Orthopedics;  Laterality: Left;   AMPUTATION Left  06/23/2021   Procedure: LEFT 2ND TOE AMPUTATION;  Surgeon: Gregory Minion, MD;  Location: [place];  Service: Orthopedics;  Laterality: Left;   AMPUTATION Right 08/21/2021   Procedure: AMPUTATION RIGHT INDEX FINGER;  Surgeon: Gregory Desanctis, MD;  Location: [place];  Service: Orthopedics;  Laterality: Right;   CLOSED REDUCTION WITH HUMER PIN INSERTION  1974   left hip   HARDWARE REMOVAL Left 07/21/2014   Procedure: HARDWARE REMOVAL;  Surgeon: Gregory Lights, MD;  Location: [place];  Service: Orthopedics;  Laterality: Left;   I & D EXTREMITY Right 08/21/2021   Procedure: IRRIGATION AND DEBRIDEMENT RIGHT INDEX FINGER;  Surgeon: Gregory Desanctis, MD;  Location: [place];  Service: Orthopedics;  Laterality: Right;   ROTATOR CUFF REPAIR Right 2005 (approx)   TOTAL HIP ARTHROPLASTY Left 07/21/2014   Procedure: TOTAL HIP ARTHROPLASTY ANTERIOR APPROACH;  Surgeon: Gregory Lights, MD;  Location: [place];  Service: Orthopedics;  Laterality: Left;   TOTAL HIP ARTHROPLASTY Right 2006 (approx)   right hip replaced    Current Medications: Current Meds  Medication Sig   Accu-Chek FastClix Lancets MISC Use Accu Chek Fastclix lancets to check blood sugar three times daily. DX:E11.65   ACCU-CHEK GUIDE test strip TEST BLOOD SUGAR THREE TIMES DAILY AS DIRECTED   acetaminophen (TYLENOL) 500 MG tablet Take 500-1,000 mg by mouth at bedtime as needed for mild pain (pain).   empagliflozin (JARDIANCE) 25 MG TABS tablet Take 1 tablet (25 mg total) by mouth daily.   fluticasone (FLONASE) 50 MCG/ACT nasal spray Place 2 sprays into both nostrils at bedtime as needed (sinus drainage).   gabapentin (NEURONTIN) 300 MG capsule Take 1 capsule (300 mg total) by mouth in the morning, at noon, in the evening, and at bedtime. TAKE 1 CAPSULE FOUR TIMES DAILY AS NEEDED Strength: 300 mg (Patient taking differently: Take 600 mg by mouth 2 (two) times daily.)   insulin aspart (NOVOLOG) 100 UNIT/ML injection Inject 16-20 units twice a day (Max daily dose  40 units) (Patient taking differently: Inject 16-20 Units into the skin 3 (three) times daily with meals. Sliding scale)   insulin degludec (TRESIBA FLEXTOUCH) 100 UNIT/ML FlexTouch Pen Inject 6 Units into the skin at bedtime.   Insulin Syringes, Disposable, U-100 0.5 ML MISC Use to inject insulin   metFORMIN (GLUCOPHAGE-XR) 750 MG 24 hr tablet TAKE 2 TABLETS EVERY DAY (Patient taking differently: Take 1,500 mg by mouth daily with breakfast.)   Multiple Vitamin (MULTIVITAMIN WITH MINERALS) TABS tablet Take 1 tablet by mouth every morning. Centrum   pioglitazone (ACTOS) 30 MG tablet TAKE 1 TABLET EVERY DAY (Patient taking differently: Take 30 mg by mouth daily.)   rivaroxaban (XARELTO) 20 MG TABS tablet Take 1 tablet (20 mg total) by mouth daily  with supper.   rosuvastatin (CRESTOR) 20 MG tablet Take 1 tablet (20 mg total) by mouth daily.     Allergies:   Patient has no known allergies.   Social History   Socioeconomic History   Marital status: Single    Spouse name: Not on file   Number of children: 0   Years of education: Not on file   Highest education level: Not on file  Occupational History   Occupation: disabled  Tobacco Use   Smoking status: Never   Smokeless tobacco: Never  Vaping Use   Vaping Use: Never used  Substance and Sexual Activity   Alcohol use: Not Currently    Comment: beer and mixed drink maybe 5  times a month   Drug use: No   Sexual activity: Never  Other Topics Concern   Not on file  Social History Narrative   Not on file   Social Determinants of Health   Financial Resource Strain: Low Risk    Difficulty of Paying Living Expenses: Not hard at all  Food Insecurity: No Food Insecurity   Worried About Charity fundraiser in the Last Year: Never true   Arboriculturist in the Last Year: Never true  Transportation Needs: Public librarian (Medical): Yes   Lack of Transportation (Non-Medical): Yes  Physical Activity:  Inactive   Days of Exercise per Week: 0 days   Minutes of Exercise per Session: 0 min  Stress: No Stress Concern Present   Feeling of Stress : Not at all  Social Connections: Socially Isolated   Frequency of Communication with Friends and Family: More than three times a week   Frequency of Social Gatherings with Friends and Family: More than three times a week   Attends Religious Services: Never   Marine scientist or Organizations: No   Attends Music therapist: Never   Marital Status: Never married     Family History: The patient's family history includes Breast cancer in his mother; Cancer in his mother; Diabetes in his brother, brother, father, mother, paternal grandfather, and paternal grandmother; Heart Problems in his maternal grandmother; Hypertension in his maternal grandmother; Liver cancer in his mother.  ROS:   Please see the history of present illness.    All other systems reviewed and are negative.  EKGs/Labs/Other Studies Reviewed:    The following studies were reviewed today:  Echo with bubble 11/16/21 IMPRESSIONS   1. Left ventricular ejection fraction, by estimation, is 60 to 65%. The  left ventricle has normal function. The left ventricle has no regional  wall motion abnormalities. Left ventricular diastolic parameters were  normal.   2. Right ventricular systolic function is normal. The right ventricular  size is normal. Tricuspid regurgitation signal is inadequate for assessing  PA pressure.   3. The mitral valve is abnormal. Trivial mitral valve regurgitation.   4. The aortic valve is tricuspid. Aortic valve regurgitation is not  visualized. Aortic valve sclerosis/calcification is present, without any  evidence of aortic stenosis. Aortic valve mean gradient measures 6.0 mmHg.   5. Aortic dilatation noted. There is borderline dilatation of the aortic  root, measuring 38 mm.   6. The inferior vena cava is normal in size with greater than 50%   respiratory variability, suggesting right atrial pressure of 3 mmHg.   7. Aneurysmal interatrial septum. Evidence of atrial level shunting  detected by color flow Doppler. Agitated saline contrast bubble study was  positive with shunting observed within 3-6 cardiac cycles suggestive of  interatrial shunt. There is a  moderately sized patent foramen ovale with predominantly right to left  shunting across the atrial septum.   Comparison(s): No prior Echocardiogram.   Conclusion(s)/Recommendation(s): Recommend structural heart team  evaluation for possible PFO closure.  _______________________   MRI Brain 11/16/21: IMPRESSION: Multiple small 3-5 mm areas of acute infarct in the right MCA territory. Findings suggests emboli. Review of the CTA reveals no source in the aortic arch or right carotid.   Atrophy and chronic ischemic changes.    _______________________  CTA Chest 11/15/21: AORTIC ARCH:   There is calcific atherosclerosis of the aortic arch. There is no aneurysm, dissection or hemodynamically significant stenosis of the visualized portion of the aorta. Conventional 3 vessel aortic branching pattern. The visualized proximal subclavian arteries are widely patent.   RIGHT CAROTID SYSTEM: No dissection, occlusion or aneurysm. Mild atherosclerotic calcification at the carotid bifurcation without hemodynamically significant stenosis.   LEFT CAROTID SYSTEM: No dissection, occlusion or aneurysm. Mild atherosclerotic calcification at the carotid bifurcation without hemodynamically significant stenosis.   VERTEBRAL ARTERIES: Left dominant configuration. Both origins are clearly patent. There is no dissection, occlusion or flow-limiting stenosis to the skull base (V1-V3 segments).   CTA HEAD FINDINGS   POSTERIOR CIRCULATION:   --Vertebral arteries: Normal V4 segments.   --Inferior cerebellar arteries: Normal.   --Basilar artery: Normal.   --Superior cerebellar  arteries: Normal.   --Posterior cerebral arteries (PCA): Normal.   ANTERIOR CIRCULATION:   --Intracranial internal carotid arteries: Normal.   --Anterior cerebral arteries (ACA): Normal. Both A1 segments are present. Patent anterior communicating artery (a-comm).   --Middle cerebral arteries (MCA): Normal.   VENOUS SINUSES: As permitted by contrast timing, patent.   ANATOMIC VARIANTS: None   Review of the MIP images confirms the above findings.   IMPRESSION: 1. No emergent large vessel occlusion or high-grade stenosis of the intracranial or cervical arteries.   Aortic Atherosclerosis (ICD10-I70.0).  EKG:  EKG is ordered today.  The ekg ordered today demonstrates sinus brady with PACs  Recent Labs: 11/15/2021: Magnesium 1.9 11/16/2021: Hemoglobin 15.3; Platelets 257 11/29/2021: ALT 61; BUN 13; Creatinine, Ser 0.94; Potassium 4.0; Sodium 138  Recent Lipid Panel    Component Value Date/Time   CHOL 136 11/29/2021 1503   TRIG 79.0 11/29/2021 1503   HDL 53.80 11/29/2021 1503   CHOLHDL 3 11/29/2021 1503   VLDL 15.8 11/29/2021 1503   LDLCALC 67 11/29/2021 1503   LDLDIRECT 91.0 07/11/2015 1329     Risk Assessment/Calculations:       Physical Exam:    VS:  BP 108/66   Pulse 83   Ht 6' (1.829 m)   Wt 200 lb (90.7 kg)   SpO2 95%   BMI 27.12 kg/m     Wt Readings from Last 3 Encounters:  01/17/22 200 lb (90.7 kg)  01/15/22 197 lb 8 oz (89.6 kg)  01/04/22 196 lb 6.4 oz (89.1 kg)     GEN:  Well nourished, well developed in no acute distress HEENT: Normal NECK: No JVD LYMPHATICS: No lymphadenopathy CARDIAC: RRR, no murmurs, rubs, gallops RESPIRATORY:  Clear to auscultation without rales, wheezing or rhonchi  ABDOMEN: Soft, non-tender, non-distended MUSCULOSKELETAL:  No edema; No deformity s/p right BKA and mild left LE edema. SKIN: Warm and dry NEUROLOGIC:  Alert and oriented x 3 PSYCHIATRIC:  Normal affect   ASSESSMENT:    1. PFO with atrial septal aneurysm    2. History  of CVA (cerebrovascular accident)   3. Essential hypertension   4. Chronic deep vein thrombosis (DVT) of proximal vein of lower extremity, unspecified laterality (HCC)    PLAN:    In order of problems listed above:  Hx of CVA with known PFO with atrial septal aneurysm: plan for TEE 6/14 with Dr. Johnsie Cancel to define anatomy.   HTN: BP well controlled. No changes made.   Chronic DVT: continue Xarelto. He is currently only getting this through samples through [facility] internal medicine.  He does have some pain and swelling behind left knee. Hurts with rest and exertion. Read PAD sign and wondered if it could be that.  Although he does have significant RFs for PAD, the symptoms are not typical for this. Will plan to continue to monitor given known chronic DVT.      Shared Decision Making/Informed Consent The risks [esophageal damage, perforation (1:10,000 risk), bleeding, pharyngeal hematoma as well as other potential complications associated with conscious sedation including aspiration, arrhythmia, respiratory failure and death], benefits (treatment guidance and diagnostic support) and alternatives of a transesophageal echocardiogram were discussed in detail with Mr. Streng and he is willing to proceed.     Medication Adjustments/Labs and Tests Ordered: Current medicines are reviewed at length with the patient today.  Concerns regarding medicines are outlined above.  Orders Placed This Encounter  Procedures   EKG 12-Lead   No orders of the defined types were placed in this encounter.   Patient Instructions  Medication Instructions:  Your physician recommends that you continue on your current medications as directed. Please refer to the Current Medication list given to you today.   *If you need a refill on your cardiac medications before your next appointment, please call your pharmacy*   Lab Work: None ordered   If you have labs (blood work) drawn today and your tests  are completely normal, you will receive your results only by: [place] (if you have MyChart) OR A paper copy in the mail If you have any lab test that is abnormal or we need to change your treatment, we will call you to review the results.   Testing/Procedures: TEE on Wednesday June 14 @ 1 pm. Please refer to instructions given    Follow-Up: Follow up based on TEE results    Other Instructions   Important Information About Sugar         Signed, Angelena Form, PA-C  01/17/2022 8:18 PM    [place]

## 2022-01-16 NOTE — H&P (View-Only) (Signed)
HEART AND [place]                                     Cardiology Office Note:    Date:  01/17/2022   ID:  CALDWELL TRIANO, DOB 1961-01-30, MRN [id number]  PCP:  Timothy Lasso, MD  [facility] Cardiologist:  None  [facility] Electrophysiologist:  None   Referring MD: Timothy Lasso, MD   Set up TEE to evaluation PFO -01/24/22  History of Present Illness:    ANANT GOODRIDGE is a 61 y.o. male with a hx of DVT/PE in 2015 treated with 8 months of [place], uncontrolled DMT2, PAD s/p R BKA, HTN, HLD, and recent CVA with diagnosis of a chronic DVT and PFO with atrial septal aneurysm who presents to clinic for follow up.    The patient was recently hospitalized in 11/2021 with sudden loss of vision in the right eye. 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  CTA study showed no evidence of carotid occlusive disease.  The patient was noted to have aortic atherosclerosis involving the aortic arch. The patient's hypercoagulable panel was negative and notably absent for lupus anticoagulant, factor V Leiden mutation, and prothrombin gene mutation.  Protein C and protein S were normal. Echo with bubble showed a PFO with an atrial septal aneurysm. The patient was also diagnosed with chronic lower extremity DVT during the same hospitalization. He has followed up with neurology and insurance did not approve the suggested 30 day cardiac monitoring.   He was seen in the office by Dr Burt Knack on 12/02/21 for consideration of PFO closure. While his ROPE Score was notably low at 4, suggesting low probability of PFO-related stroke, the clinical scenario with suggestion of cardioembolic event, diagnosis of chronic DVT, and PFO with atrial septal aneurysm is concerning for the possibility of a PFO-related stroke. It was recommended that he continue on Eliquis and proceed with TEE to  further define his PFO anatomy, quantitate the size of PFO and shunt, and confirm the presence of atrial septal aneurysm.   Today the patient presents to clinic for follow up. Here alone. Still cant see out of his right eye. No CP or SOB. No LE edema, orthopnea or PND. No dizziness or syncope. No blood in stool or urine. No palpitations. He does have some pain and swelling behind left knee. Hurts with rest and exertion. Read PAD sign and wondered if it could be that.      Past Medical History:  Diagnosis Date   Arthritis    bilateral hips   Cutaneous abscess of left foot    Deep vein thrombosis (DVT) (HCC)    Diabetes mellitus    type II   Diabetic ulcer of heel (HCC)    Right heel   DJD (degenerative joint disease)    DVT (deep venous thrombosis) ([place]) 02/08/2014   Proximal provoked. Date of diagnosis February 08 2014 Duration of anticoagulation: 6 months. End date 08/12/2014.  Anticoagulant: Lovenox 120 units daily Switched to Eliquis on 05/25/2014      Ear drum perforation, right 03/06/2019   Non-pressure chronic ulcer of right calf, limited to breakdown of skin ([place]) 04/17/2017   Pulmonary emboli ([place]) 02/08/2014   Date of diagnosis February 08 2014, on chest CTA Hospitalized for 3 days Had some  symptoms of shortness of breath, and chest pain With intercurrent DVT of the left LE. Duration of anticoagulation: 8 months. End date 10/11/2014.  Anticoagulant: Lovenox 120 units daily Switched to Eliquis on 05/25/2014 per patient preference    Pulmonary embolism ([place])    Sebaceous cyst    on back of neck    Past Surgical History:  Procedure Laterality Date   AMPUTATION Right 06/03/2015   Procedure: Right Below Knee Amputation;  Surgeon: [name], MD;  Location: [place];  Service: Orthopedics;  Laterality: Right;   AMPUTATION Left 07/24/2019   Procedure: LEFT FOOT 3RD RAY AMPUTATION;  Surgeon: [name], MD;  Location: [place];  Service: Orthopedics;  Laterality: Left;   AMPUTATION Left  06/23/2021   Procedure: LEFT 2ND TOE AMPUTATION;  Surgeon: [name], MD;  Location: [place];  Service: Orthopedics;  Laterality: Left;   AMPUTATION Right 08/21/2021   Procedure: AMPUTATION RIGHT INDEX FINGER;  Surgeon: Orene Desanctis, MD;  Location: [place];  Service: Orthopedics;  Laterality: Right;   CLOSED REDUCTION WITH HUMER PIN INSERTION  1974   left hip   HARDWARE REMOVAL Left 07/21/2014   Procedure: HARDWARE REMOVAL;  Surgeon: Ninetta Lights, MD;  Location: [place];  Service: Orthopedics;  Laterality: Left;   I & D EXTREMITY Right 08/21/2021   Procedure: IRRIGATION AND DEBRIDEMENT RIGHT INDEX FINGER;  Surgeon: Orene Desanctis, MD;  Location: [place];  Service: Orthopedics;  Laterality: Right;   ROTATOR CUFF REPAIR Right 2005 (approx)   TOTAL HIP ARTHROPLASTY Left 07/21/2014   Procedure: TOTAL HIP ARTHROPLASTY ANTERIOR APPROACH;  Surgeon: Ninetta Lights, MD;  Location: [place];  Service: Orthopedics;  Laterality: Left;   TOTAL HIP ARTHROPLASTY Right 2006 (approx)   right hip replaced    Current Medications: Current Meds  Medication Sig   Accu-Chek FastClix Lancets MISC Use Accu Chek Fastclix lancets to check blood sugar three times daily. DX:E11.65   ACCU-CHEK GUIDE test strip TEST BLOOD SUGAR THREE TIMES DAILY AS DIRECTED   acetaminophen (TYLENOL) 500 MG tablet Take 500-1,000 mg by mouth at bedtime as needed for mild pain (pain).   empagliflozin (JARDIANCE) 25 MG TABS tablet Take 1 tablet (25 mg total) by mouth daily.   fluticasone (FLONASE) 50 MCG/ACT nasal spray Place 2 sprays into both nostrils at bedtime as needed (sinus drainage).   gabapentin (NEURONTIN) 300 MG capsule Take 1 capsule (300 mg total) by mouth in the morning, at noon, in the evening, and at bedtime. TAKE 1 CAPSULE FOUR TIMES DAILY AS NEEDED Strength: 300 mg (Patient taking differently: Take 600 mg by mouth 2 (two) times daily.)   insulin aspart (NOVOLOG) 100 UNIT/ML injection Inject 16-20 units twice a day (Max daily dose  40 units) (Patient taking differently: Inject 16-20 Units into the skin 3 (three) times daily with meals. Sliding scale)   insulin degludec (TRESIBA FLEXTOUCH) 100 UNIT/ML FlexTouch Pen Inject 6 Units into the skin at bedtime.   Insulin Syringes, Disposable, U-100 0.5 ML MISC Use to inject insulin   metFORMIN (GLUCOPHAGE-XR) 750 MG 24 hr tablet TAKE 2 TABLETS EVERY DAY (Patient taking differently: Take 1,500 mg by mouth daily with breakfast.)   Multiple Vitamin (MULTIVITAMIN WITH MINERALS) TABS tablet Take 1 tablet by mouth every morning. Centrum   pioglitazone (ACTOS) 30 MG tablet TAKE 1 TABLET EVERY DAY (Patient taking differently: Take 30 mg by mouth daily.)   rivaroxaban (XARELTO) 20 MG TABS tablet Take 1 tablet (20 mg total) by mouth daily  with supper.   rosuvastatin (CRESTOR) 20 MG tablet Take 1 tablet (20 mg total) by mouth daily.     Allergies:   Patient has no known allergies.   Social History   Socioeconomic History   Marital status: Single    Spouse name: Not on file   Number of children: 0   Years of education: Not on file   Highest education level: Not on file  Occupational History   Occupation: disabled  Tobacco Use   Smoking status: Never   Smokeless tobacco: Never  Vaping Use   Vaping Use: Never used  Substance and Sexual Activity   Alcohol use: Not Currently    Comment: beer and mixed drink maybe 5  times a month   Drug use: No   Sexual activity: Never  Other Topics Concern   Not on file  Social History Narrative   Not on file   Social Determinants of Health   Financial Resource Strain: Low Risk    Difficulty of Paying Living Expenses: Not hard at all  Food Insecurity: No Food Insecurity   Worried About Charity fundraiser in the Last Year: Never true   Arboriculturist in the Last Year: Never true  Transportation Needs: Public librarian (Medical): Yes   Lack of Transportation (Non-Medical): Yes  Physical Activity:  Inactive   Days of Exercise per Week: 0 days   Minutes of Exercise per Session: 0 min  Stress: No Stress Concern Present   Feeling of Stress : Not at all  Social Connections: Socially Isolated   Frequency of Communication with Friends and Family: More than three times a week   Frequency of Social Gatherings with Friends and Family: More than three times a week   Attends Religious Services: Never   Marine scientist or Organizations: No   Attends Music therapist: Never   Marital Status: Never married     Family History: The patient's family history includes Breast cancer in his mother; Cancer in his mother; Diabetes in his brother, brother, father, mother, paternal grandfather, and paternal grandmother; Heart Problems in his maternal grandmother; Hypertension in his maternal grandmother; Liver cancer in his mother.  ROS:   Please see the history of present illness.    All other systems reviewed and are negative.  EKGs/Labs/Other Studies Reviewed:    The following studies were reviewed today:  Echo with bubble 11/16/21 IMPRESSIONS   1. Left ventricular ejection fraction, by estimation, is 60 to 65%. The  left ventricle has normal function. The left ventricle has no regional  wall motion abnormalities. Left ventricular diastolic parameters were  normal.   2. Right ventricular systolic function is normal. The right ventricular  size is normal. Tricuspid regurgitation signal is inadequate for assessing  PA pressure.   3. The mitral valve is abnormal. Trivial mitral valve regurgitation.   4. The aortic valve is tricuspid. Aortic valve regurgitation is not  visualized. Aortic valve sclerosis/calcification is present, without any  evidence of aortic stenosis. Aortic valve mean gradient measures 6.0 mmHg.   5. Aortic dilatation noted. There is borderline dilatation of the aortic  root, measuring 38 mm.   6. The inferior vena cava is normal in size with greater than 50%   respiratory variability, suggesting right atrial pressure of 3 mmHg.   7. Aneurysmal interatrial septum. Evidence of atrial level shunting  detected by color flow Doppler. Agitated saline contrast bubble study was  positive with shunting observed within 3-6 cardiac cycles suggestive of  interatrial shunt. There is a  moderately sized patent foramen ovale with predominantly right to left  shunting across the atrial septum.   Comparison(s): No prior Echocardiogram.   Conclusion(s)/Recommendation(s): Recommend structural heart team  evaluation for possible PFO closure.  _______________________   MRI Brain 11/16/21: IMPRESSION: Multiple small 3-5 mm areas of acute infarct in the right MCA territory. Findings suggests emboli. Review of the CTA reveals no source in the aortic arch or right carotid.   Atrophy and chronic ischemic changes.    _______________________  CTA Chest 11/15/21: AORTIC ARCH:   There is calcific atherosclerosis of the aortic arch. There is no aneurysm, dissection or hemodynamically significant stenosis of the visualized portion of the aorta. Conventional 3 vessel aortic branching pattern. The visualized proximal subclavian arteries are widely patent.   RIGHT CAROTID SYSTEM: No dissection, occlusion or aneurysm. Mild atherosclerotic calcification at the carotid bifurcation without hemodynamically significant stenosis.   LEFT CAROTID SYSTEM: No dissection, occlusion or aneurysm. Mild atherosclerotic calcification at the carotid bifurcation without hemodynamically significant stenosis.   VERTEBRAL ARTERIES: Left dominant configuration. Both origins are clearly patent. There is no dissection, occlusion or flow-limiting stenosis to the skull base (V1-V3 segments).   CTA HEAD FINDINGS   POSTERIOR CIRCULATION:   --Vertebral arteries: Normal V4 segments.   --Inferior cerebellar arteries: Normal.   --Basilar artery: Normal.   --Superior cerebellar  arteries: Normal.   --Posterior cerebral arteries (PCA): Normal.   ANTERIOR CIRCULATION:   --Intracranial internal carotid arteries: Normal.   --Anterior cerebral arteries (ACA): Normal. Both A1 segments are present. Patent anterior communicating artery (a-comm).   --Middle cerebral arteries (MCA): Normal.   VENOUS SINUSES: As permitted by contrast timing, patent.   ANATOMIC VARIANTS: None   Review of the MIP images confirms the above findings.   IMPRESSION: 1. No emergent large vessel occlusion or high-grade stenosis of the intracranial or cervical arteries.   Aortic Atherosclerosis (ICD10-I70.0).  EKG:  EKG is ordered today.  The ekg ordered today demonstrates sinus brady with PACs  Recent Labs: 11/15/2021: Magnesium 1.9 11/16/2021: Hemoglobin 15.3; Platelets 257 11/29/2021: ALT 61; BUN 13; Creatinine, Ser 0.94; Potassium 4.0; Sodium 138  Recent Lipid Panel    Component Value Date/Time   CHOL 136 11/29/2021 1503   TRIG 79.0 11/29/2021 1503   HDL 53.80 11/29/2021 1503   CHOLHDL 3 11/29/2021 1503   VLDL 15.8 11/29/2021 1503   LDLCALC 67 11/29/2021 1503   LDLDIRECT 91.0 07/11/2015 1329     Risk Assessment/Calculations:       Physical Exam:    VS:  BP 108/66   Pulse 83   Ht 6' (1.829 m)   Wt 200 lb (90.7 kg)   SpO2 95%   BMI 27.12 kg/m     Wt Readings from Last 3 Encounters:  01/17/22 200 lb (90.7 kg)  01/15/22 197 lb 8 oz (89.6 kg)  01/04/22 196 lb 6.4 oz (89.1 kg)     GEN:  Well nourished, well developed in no acute distress HEENT: Normal NECK: No JVD LYMPHATICS: No lymphadenopathy CARDIAC: RRR, no murmurs, rubs, gallops RESPIRATORY:  Clear to auscultation without rales, wheezing or rhonchi  ABDOMEN: Soft, non-tender, non-distended MUSCULOSKELETAL:  No edema; No deformity s/p right BKA and mild left LE edema. SKIN: Warm and dry NEUROLOGIC:  Alert and oriented x 3 PSYCHIATRIC:  Normal affect   ASSESSMENT:    1. PFO with atrial septal aneurysm    2. History  of CVA (cerebrovascular accident)   3. Essential hypertension   4. Chronic deep vein thrombosis (DVT) of proximal vein of lower extremity, unspecified laterality (HCC)    PLAN:    In order of problems listed above:  Hx of CVA with known PFO with atrial septal aneurysm: plan for TEE 6/14 with Dr. Johnsie Cancel to define anatomy.   HTN: BP well controlled. No changes made.   Chronic DVT: continue Xarelto. He is currently only getting this through samples through [facility] internal medicine.  He does have some pain and swelling behind left knee. Hurts with rest and exertion. Read PAD sign and wondered if it could be that.  Although he does have significant RFs for PAD, the symptoms are not typical for this. Will plan to continue to monitor given known chronic DVT.      Shared Decision Making/Informed Consent The risks [esophageal damage, perforation (1:10,000 risk), bleeding, pharyngeal hematoma as well as other potential complications associated with conscious sedation including aspiration, arrhythmia, respiratory failure and death], benefits (treatment guidance and diagnostic support) and alternatives of a transesophageal echocardiogram were discussed in detail with Mr. Kalisz and he is willing to proceed.     Medication Adjustments/Labs and Tests Ordered: Current medicines are reviewed at length with the patient today.  Concerns regarding medicines are outlined above.  Orders Placed This Encounter  Procedures   EKG 12-Lead   No orders of the defined types were placed in this encounter.   Patient Instructions  Medication Instructions:  Your physician recommends that you continue on your current medications as directed. Please refer to the Current Medication list given to you today.   *If you need a refill on your cardiac medications before your next appointment, please call your pharmacy*   Lab Work: None ordered   If you have labs (blood work) drawn today and your tests  are completely normal, you will receive your results only by: [place] (if you have MyChart) OR A paper copy in the mail If you have any lab test that is abnormal or we need to change your treatment, we will call you to review the results.   Testing/Procedures: TEE on Wednesday June 14 @ 1 pm. Please refer to instructions given    Follow-Up: Follow up based on TEE results    Other Instructions   Important Information About Sugar         Signed, Angelena Form, PA-C  01/17/2022 8:18 PM    [place]

## 2022-01-16 NOTE — Telephone Encounter (Signed)
Pt is requesting a call back .. he stated that Dr Ruben Im has been giving him samples of  Gregory May )  and he is about to out he is coming this way tomorrow 6/7 for another appt  .. so he is wanting to come by to collect some more

## 2022-01-17 ENCOUNTER — Ambulatory Visit (INDEPENDENT_AMBULATORY_CARE_PROVIDER_SITE_OTHER): Payer: Medicare HMO | Admitting: Physician Assistant

## 2022-01-17 ENCOUNTER — Other Ambulatory Visit: Payer: Medicare HMO

## 2022-01-17 VITALS — BP 108/66 | HR 83 | Ht 72.0 in | Wt 200.0 lb

## 2022-01-17 DIAGNOSIS — I825Y9 Chronic embolism and thrombosis of unspecified deep veins of unspecified proximal lower extremity: Secondary | ICD-10-CM | POA: Diagnosis not present

## 2022-01-17 DIAGNOSIS — Q2112 Patent foramen ovale: Secondary | ICD-10-CM | POA: Diagnosis not present

## 2022-01-17 DIAGNOSIS — I10 Essential (primary) hypertension: Secondary | ICD-10-CM | POA: Diagnosis not present

## 2022-01-17 DIAGNOSIS — Z01810 Encounter for preprocedural cardiovascular examination: Secondary | ICD-10-CM | POA: Diagnosis not present

## 2022-01-17 DIAGNOSIS — I253 Aneurysm of heart: Secondary | ICD-10-CM | POA: Diagnosis not present

## 2022-01-17 DIAGNOSIS — Z8673 Personal history of transient ischemic attack (TIA), and cerebral infarction without residual deficits: Secondary | ICD-10-CM | POA: Diagnosis not present

## 2022-01-17 NOTE — Patient Instructions (Signed)
Medication Instructions:  Your physician recommends that you continue on your current medications as directed. Please refer to the Current Medication list given to you today.   *If you need a refill on your cardiac medications before your next appointment, please call your pharmacy*   Lab Work: None ordered   If you have labs (blood work) drawn today and your tests are completely normal, you will receive your results only by: [place] (if you have MyChart) OR A paper copy in the mail If you have any lab test that is abnormal or we need to change your treatment, we will call you to review the results.   Testing/Procedures: TEE on Wednesday June 14 @ 1 pm. Please refer to instructions given    Follow-Up: Follow up based on TEE results    Other Instructions   Important Information About Sugar

## 2022-01-17 NOTE — Telephone Encounter (Signed)
Medication Samples have been provided to the patient.  Drug name: XARELTO       Strength: 20MG         Qty: 28  LOT: [id number]  Exp.Date: 06/13/23  Dosing instructions: TAKE 1 DAILY  06/15/23 Mako Pelfrey 11:23 AM 01/17/2022

## 2022-01-18 LAB — BASIC METABOLIC PANEL
BUN/Creatinine Ratio: 17 (ref 9–20)
BUN: 15 mg/dL (ref 6–24)
CO2: 24 mmol/L (ref 20–29)
Calcium: 9.7 mg/dL (ref 8.7–10.2)
Chloride: 102 mmol/L (ref 96–106)
Creatinine, Ser: 0.87 mg/dL (ref 0.76–1.27)
Glucose: 122 mg/dL — ABNORMAL HIGH (ref 70–99)
Potassium: 4.7 mmol/L (ref 3.5–5.2)
Sodium: 140 mmol/L (ref 134–144)
eGFR: 99 mL/min/{1.73_m2} (ref >59)

## 2022-01-18 LAB — CBC
Hematocrit: 45.9 % (ref 37.5–51.0)
Hemoglobin: 15.2 g/dL (ref 13.0–17.7)
MCH: 31.1 pg (ref 26.6–33.0)
MCHC: 33.1 g/dL (ref 31.5–35.7)
MCV: 94 fL (ref 79–97)
Platelets: 297 10*3/uL (ref 150–450)
RBC: 4.88 x10E6/uL (ref 4.14–5.80)
RDW: 12.6 % (ref 11.6–15.4)
WBC: 8.4 10*3/uL (ref 3.4–10.8)

## 2022-01-19 ENCOUNTER — Ambulatory Visit: Payer: Medicare HMO

## 2022-01-19 ENCOUNTER — Other Ambulatory Visit: Payer: Medicare HMO

## 2022-01-23 ENCOUNTER — Telehealth: Payer: Self-pay | Admitting: Cardiovascular Disease

## 2022-01-23 NOTE — Telephone Encounter (Signed)
Left a message to call back.

## 2022-01-23 NOTE — Telephone Encounter (Signed)
Pt has a scheduled procedure for tomorrow 01/24/22 and would like a call back on what diabetic medications he is suppose to hold. Pt states that he was told but forgot. Please advise

## 2022-01-24 ENCOUNTER — Other Ambulatory Visit: Payer: Self-pay

## 2022-01-24 ENCOUNTER — Encounter (HOSPITAL_COMMUNITY)
Admission: RE | Disposition: A | Discharge status: Home / Self Care | Payer: Medicare HMO | Source: Home / Self Care | Attending: Cardiovascular Disease

## 2022-01-24 ENCOUNTER — Ambulatory Visit (HOSPITAL_COMMUNITY)
Admission: RE | Admit: 2022-01-24 | Discharge: 2022-01-24 | Disposition: A | Discharge status: Home / Self Care | Payer: Medicare HMO | Attending: Cardiovascular Disease | Admitting: Cardiovascular Disease

## 2022-01-24 ENCOUNTER — Ambulatory Visit (HOSPITAL_BASED_OUTPATIENT_CLINIC_OR_DEPARTMENT_OTHER): Payer: Medicare HMO | Admitting: Certified Registered"

## 2022-01-24 ENCOUNTER — Ambulatory Visit (HOSPITAL_BASED_OUTPATIENT_CLINIC_OR_DEPARTMENT_OTHER)
Admission: RE | Admit: 2022-01-24 | Discharge: 2022-01-24 | Disposition: A | Discharge status: Home / Self Care | Payer: Medicare HMO | Source: Ambulatory Visit | Attending: Cardiovascular Disease | Admitting: Cardiovascular Disease

## 2022-01-24 ENCOUNTER — Ambulatory Visit (HOSPITAL_COMMUNITY): Payer: Medicare HMO | Admitting: Certified Registered"

## 2022-01-24 DIAGNOSIS — I639 Cerebral infarction, unspecified: Secondary | ICD-10-CM

## 2022-01-24 DIAGNOSIS — Z794 Long term (current) use of insulin: Secondary | ICD-10-CM | POA: Insufficient documentation

## 2022-01-24 DIAGNOSIS — I82509 Chronic embolism and thrombosis of unspecified deep veins of unspecified lower extremity: Secondary | ICD-10-CM | POA: Diagnosis not present

## 2022-01-24 DIAGNOSIS — Q213 Tetralogy of Fallot: Secondary | ICD-10-CM | POA: Diagnosis not present

## 2022-01-24 DIAGNOSIS — I253 Aneurysm of heart: Secondary | ICD-10-CM | POA: Diagnosis not present

## 2022-01-24 DIAGNOSIS — E119 Type 2 diabetes mellitus without complications: Secondary | ICD-10-CM

## 2022-01-24 DIAGNOSIS — Z7901 Long term (current) use of anticoagulants: Secondary | ICD-10-CM | POA: Diagnosis not present

## 2022-01-24 DIAGNOSIS — E785 Hyperlipidemia, unspecified: Secondary | ICD-10-CM | POA: Insufficient documentation

## 2022-01-24 DIAGNOSIS — Z89511 Acquired absence of right leg below knee: Secondary | ICD-10-CM | POA: Insufficient documentation

## 2022-01-24 DIAGNOSIS — E1151 Type 2 diabetes mellitus with diabetic peripheral angiopathy without gangrene: Secondary | ICD-10-CM | POA: Diagnosis not present

## 2022-01-24 DIAGNOSIS — I340 Nonrheumatic mitral (valve) insufficiency: Secondary | ICD-10-CM

## 2022-01-24 DIAGNOSIS — I10 Essential (primary) hypertension: Secondary | ICD-10-CM | POA: Insufficient documentation

## 2022-01-24 DIAGNOSIS — Z8673 Personal history of transient ischemic attack (TIA), and cerebral infarction without residual deficits: Secondary | ICD-10-CM | POA: Insufficient documentation

## 2022-01-24 DIAGNOSIS — Z86711 Personal history of pulmonary embolism: Secondary | ICD-10-CM | POA: Insufficient documentation

## 2022-01-24 DIAGNOSIS — Z7984 Long term (current) use of oral hypoglycemic drugs: Secondary | ICD-10-CM | POA: Insufficient documentation

## 2022-01-24 DIAGNOSIS — Q2112 Patent foramen ovale: Secondary | ICD-10-CM | POA: Insufficient documentation

## 2022-01-24 DIAGNOSIS — I081 Rheumatic disorders of both mitral and tricuspid valves: Secondary | ICD-10-CM | POA: Insufficient documentation

## 2022-01-24 HISTORY — PX: BUBBLE STUDY: SHX6837

## 2022-01-24 HISTORY — PX: TEE WITHOUT CARDIOVERSION: SHX5443

## 2022-01-24 LAB — GLUCOSE, CAPILLARY: Glucose-Capillary: 119 mg/dL — ABNORMAL HIGH (ref 70–99)

## 2022-01-24 SURGERY — ECHOCARDIOGRAM, TRANSESOPHAGEAL
Anesthesia: Monitor Anesthesia Care

## 2022-01-24 MED ORDER — SODIUM CHLORIDE 0.9 % IV SOLN: INTRAVENOUS | Status: DC | PRN

## 2022-01-24 MED ORDER — PROPOFOL 500 MG/50ML IV EMUL
INTRAVENOUS | Status: DC | PRN
  Administered 2022-01-24: 175 ug/kg/min via INTRAVENOUS

## 2022-01-24 MED ORDER — SODIUM CHLORIDE 0.9 % IV SOLN
INTRAVENOUS | Status: AC | PRN
  Administered 2022-01-24: 500 mL via INTRAVENOUS

## 2022-01-24 MED ORDER — PROPOFOL 10 MG/ML IV BOLUS
INTRAVENOUS | Status: DC | PRN
  Administered 2022-01-24: 10 mg via INTRAVENOUS
  Administered 2022-01-24: 20 mg via INTRAVENOUS

## 2022-01-24 NOTE — Telephone Encounter (Signed)
Reached out to patient who states that he just hung up with someone who reviewed this with him and he had no other questions.

## 2022-01-24 NOTE — Discharge Instructions (Signed)

## 2022-01-24 NOTE — Progress Notes (Signed)
  Echocardiogram Echocardiogram Transesophageal has been performed.  Leta Jungling M 01/24/2022, 2:18 PM

## 2022-01-24 NOTE — Transfer of Care (Signed)
Immediate Anesthesia Transfer of Care Note  Patient: Gregory May  Procedure(s) Performed: TRANSESOPHAGEAL ECHOCARDIOGRAM (TEE) BUBBLE STUDY  Patient Location: Endoscopy Unit  Anesthesia Type:MAC  Level of Consciousness: awake, drowsy and patient cooperative  Airway & Oxygen Therapy: Patient Spontanous Breathing and Patient connected to nasal cannula oxygen  Post-op Assessment: Report given to RN, Post -op Vital signs reviewed and stable and Patient moving all extremities X 4  Post vital signs: Reviewed and stable  Last Vitals:  Vitals Value Taken Time  BP    Temp    Pulse    Resp    SpO2      Last Pain:  Vitals:   01/24/22 1311  TempSrc: Temporal  PainSc: 0-No pain         Complications: No notable events documented.

## 2022-01-24 NOTE — Anesthesia Preprocedure Evaluation (Addendum)
Anesthesia Evaluation  Patient identified by MRN, date of birth, ID band Patient awake    Reviewed: Allergy & Precautions, NPO status , Patient's Chart, lab work & pertinent test results  Airway Mallampati: II  TM Distance: >3 FB Neck ROM: Full    Dental  (+) Poor Dentition, Missing   Pulmonary sleep apnea ,    Pulmonary exam normal        Cardiovascular hypertension, + DVT   Rhythm:Regular Rate:Normal     Neuro/Psych negative neurological ROS  negative psych ROS   GI/Hepatic Neg liver ROS, GERD  Medicated,  Endo/Other  diabetes, Type 2, Oral Hypoglycemic Agents, Insulin Dependent  Renal/GU negative Renal ROS  negative genitourinary   Musculoskeletal  (+) Arthritis , Osteoarthritis,    Abdominal Normal abdominal exam  (+)   Peds  Hematology   Anesthesia Other Findings   Reproductive/Obstetrics                            Anesthesia Physical Anesthesia Plan  ASA: 3  Anesthesia Plan: MAC   Post-op Pain Management:    Induction: Intravenous  PONV Risk Score and Plan: 1 and Propofol infusion and Treatment may vary due to age or medical condition  Airway Management Planned: Simple Face Mask, Natural Airway and Nasal Cannula  Additional Equipment: None  Intra-op Plan:   Post-operative Plan:   Informed Consent: I have reviewed the patients History and Physical, chart, labs and discussed the procedure including the risks, benefits and alternatives for the proposed anesthesia with the patient or authorized representative who has indicated his/her understanding and acceptance.     Dental advisory given  Plan Discussed with:   Anesthesia Plan Comments: (Lab Results      Component                Value               Date                      WBC                      8.4                 01/17/2022                HGB                      15.2                01/17/2022                 HCT                      45.9                01/17/2022                MCV                      94                  01/17/2022                PLT  297                 01/17/2022           Lab Results      Component                Value               Date                      NA                       140                 01/17/2022                K                        4.7                 01/17/2022                CO2                      24                  01/17/2022                GLUCOSE                  122 (H)             01/17/2022                BUN                      15                  01/17/2022                CREATININE               0.87                01/17/2022                CALCIUM                  9.7                 01/17/2022                EGFR                     99                  01/17/2022                GFRNONAA                 >60                 11/16/2021          )        Anesthesia Quick Evaluation

## 2022-01-24 NOTE — CV Procedure (Signed)
TEE Anesthesia:  Propofol Extensive 3D imaging of atrial septum  Normal EF 60%  No LAA thrombus Large PFO measuring 0.75 cm with atrial septal aneurysm Large right to left shunt on bubble study Normal AV No Effusion  Normal aortic root  Mild MR   Patient has been referred to Dr Excell Seltzer to consider closure percutaneously  In setting of multiple previous strokes  Charlton Haws MD [facility]

## 2022-01-24 NOTE — Interval H&P Note (Signed)
History and Physical Interval Note:  01/24/2022 1:23 PM  Gregory May  has presented today for surgery, with the diagnosis of TFO.  The various methods of treatment have been discussed with the patient and family. After consideration of risks, benefits and other options for treatment, the patient has consented to  Procedure(s): TRANSESOPHAGEAL ECHOCARDIOGRAM (TEE) (N/A) as a surgical intervention.  The patient's history has been reviewed, patient examined, no change in status, stable for surgery.  I have reviewed the patient's chart and labs.  Questions were answered to the patient's satisfaction.     Jenkins Rouge

## 2022-01-25 ENCOUNTER — Ambulatory Visit (INDEPENDENT_AMBULATORY_CARE_PROVIDER_SITE_OTHER): Payer: Medicare HMO | Admitting: Orthopedic Surgery

## 2022-01-25 ENCOUNTER — Encounter (HOSPITAL_COMMUNITY): Payer: Self-pay | Admitting: Cardiovascular Disease

## 2022-01-25 DIAGNOSIS — Z89511 Acquired absence of right leg below knee: Secondary | ICD-10-CM | POA: Diagnosis not present

## 2022-01-25 DIAGNOSIS — S88111A Complete traumatic amputation at level between knee and ankle, right lower leg, initial encounter: Secondary | ICD-10-CM

## 2022-01-25 DIAGNOSIS — L97521 Non-pressure chronic ulcer of other part of left foot limited to breakdown of skin: Secondary | ICD-10-CM

## 2022-01-25 NOTE — Anesthesia Postprocedure Evaluation (Signed)
Anesthesia Post Note  Patient: Gregory May  Procedure(s) Performed: TRANSESOPHAGEAL ECHOCARDIOGRAM (TEE) BUBBLE STUDY     Patient location during evaluation: PACU Anesthesia Type: MAC Level of consciousness: awake and alert Pain management: pain level controlled Vital Signs Assessment: post-procedure vital signs reviewed and stable Respiratory status: spontaneous breathing, nonlabored ventilation, respiratory function stable and patient connected to nasal cannula oxygen Cardiovascular status: stable and blood pressure returned to baseline Postop Assessment: no apparent nausea or vomiting Anesthetic complications: no   No notable events documented.  Last Vitals:  Vitals:   01/24/22 1414 01/24/22 1427  BP: 100/62 (!) 101/49  Pulse: (!) 56 (!) 56  Resp: 17 11  Temp:    SpO2: 96% 95%    Last Pain:  Vitals:   01/24/22 1414  TempSrc:   PainSc: 0-No pain                 Belenda Cruise P Evelyn Moch

## 2022-01-28 ENCOUNTER — Encounter: Payer: Self-pay | Admitting: Orthopedic Surgery

## 2022-01-28 NOTE — Progress Notes (Signed)
Office Visit Note   Patient: Gregory May           Date of Birth: Jun 26, 1961           MRN: [id number] Visit Date: 01/25/2022              Requested by: [name], MD [address] [place],  [place] [address] PCP: [name], MD  Chief Complaint  Patient presents with   Left Foot - Follow-up    06/23/2022 left foot 2nd toe amputation       HPI: Patient is a 61 year old gentleman who is seen in follow-up for right transtibial amputation and ulceration and left foot second toe amputation.  Patient states he has had decreased volume in the residual limb and has pain with weightbearing.  The socket has been modified patient has been wearing multiple socks.  Assessment & Plan: Visit Diagnoses:  1. Below-knee amputation of right lower extremity (HCC)   2. Ulcer of toe of left foot, limited to breakdown of skin (HCC)     Plan: Patient was provided a prescription for [employer] for new socket liner materials and supplies.  The left heel ulcer was debrided nails were trimmed.  Follow-Up Instructions: Return in about 3 months (around 04/27/2022).   Ortho Exam  Patient is alert, oriented, no adenopathy, well-dressed, normal affect, normal respiratory effort. Examination patient has no ulcers on the right below-knee amputation however he has significant decreased volume and is developing pressure points with pending skin breakdown.  Left foot he has thickened discolored onychomycotic nails the nails were trimmed x3 he has a left heel Wagner grade 1 ulcer.  After informed consent a 10 blade knife was used to debride the skin and soft tissue back to healthy viable granulation tissue.  The wound was 1 cm diameter prior to debridement 2 cm in diameter after debridement.  Patient is an existing right transtibial  amputee.  Patient's current comorbidities are not expected to impact the ability to function with the prescribed prosthesis. Patient verbally communicates a  strong desire to use a prosthesis. Patient currently requires mobility aids to ambulate without a prosthesis.  Expects not to use mobility aids with a new prosthesis.  Patient is a K3 level ambulator that spends a lot of time walking around on uneven terrain over obstacles, up and down stairs, and ambulates with a variable cadence.     Imaging: No results found. No images are attached to the encounter.  Labs: Lab Results  Component Value Date   HGBA1C 7.2 (H) 11/29/2021   HGBA1C 6.8 (H) 11/16/2021   HGBA1C 7.5 (H) 08/31/2021   ESRSEDRATE 11 11/15/2021   ESRSEDRATE 25 (H) 08/19/2021   ESRSEDRATE 8 07/15/2019   CRP 0.7 11/17/2021   CRP <0.5 11/15/2021   CRP 1.7 (H) 08/19/2021   REPTSTATUS 08/26/2021 FINAL 08/21/2021   GRAMSTAIN  08/21/2021    WBC PRESENT,BOTH PMN AND MONONUCLEAR NO ORGANISMS SEEN    CULT  08/21/2021    No growth aerobically or anaerobically. Performed at [facility], [address] [place], [place] [address]    [facility] STAPHYLOCOCCUS HOMINIS 08/21/2021     Lab Results  Component Value Date   ALBUMIN 4.6 11/29/2021   ALBUMIN 3.8 11/15/2021   ALBUMIN 4.1 08/31/2021    Lab Results  Component Value Date   MG 1.9 11/15/2021   Lab Results  Component Value Date   VD25OH 16 (L) 11/05/2013    No results found for: "PREALBUMIN"  Latest Ref Rng & Units 01/17/2022    3:43 PM 11/16/2021    3:40 AM 11/15/2021    5:03 PM  CBC EXTENDED  WBC 3.4 - 10.8 x10E3/uL 8.4  8.2  10.1   RBC 4.14 - 5.80 x10E6/uL 4.88  4.82  4.97   Hemoglobin 13.0 - 17.7 g/dL 15.2  15.3  15.6   HCT 37.5 - 51.0 % 45.9  46.2  48.3   Platelets 150 - 450 x10E3/uL 297  257  300   NEUT# 1.7 - 7.7 K/uL   6.7   Lymph# 0.7 - 4.0 K/uL   2.2      There is no height or weight on file to calculate BMI.  Orders:  No orders of the defined types were placed in this encounter.  No orders of the defined types were placed in this encounter.    Procedures: No procedures  performed  Clinical Data: No additional findings.  ROS:  All other systems negative, except as noted in the HPI. Review of Systems  Objective: Vital Signs: There were no vitals taken for this visit.  Specialty Comments:  No specialty comments available.  PMFS History: Patient Active Problem List   Diagnosis Date Noted   Right retinal embolus 01/15/2022   DM type 2 with diabetic peripheral neuropathy ([place]) 01/15/2022   Obstructive sleep apnea 01/15/2022   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   Transportation insecurity 11/23/2021   Retinal artery occlusion, central 11/15/2021   Epidermal inclusion cyst 10/18/2021   Acute postoperative pain 09/05/2021   Amputated finger 09/05/2021   Osteomyelitis of finger of right hand ([place]) 08/19/2021   Cellulitis of finger of right hand 08/17/2021   Osteomyelitis of second toe of left foot ([place])    Hepatic steatosis 06/24/2020   Back pain 04/20/2020   Flu vaccine need 04/20/2020   COVID-19 virus vaccination declined 04/20/2020   Left knee pain 04/20/2020   Allergic sinusitis 04/20/2020   Elevated liver enzymes 04/20/2020   Diastasis of rectus abdominis 07/16/2019   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 10/07/2018   Essential hypertension 06/19/2018   Peripheral neuropathy 12/19/2017   Acquired absence of right leg below knee ([place]) 04/17/2017   Carpal tunnel syndrome 03/18/2017   Colon cancer screening 06/21/2015   Status post below knee amputation of right lower extremity ([place]) 06/07/2015   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 ([place]) 06/01/2015   Diabetic polyneuropathy associated with type 2 diabetes mellitus ([place]) 12/09/2014   Diabetes mellitus with carpal tunnel syndrome ([place]) 12/09/2014   DJD (degenerative joint disease) of knee 07/21/2014   Osteoarthritis, hip, bilateral 05/25/2014   Onychomycosis 10/14/2013   Pure hypercholesterolemia 09/25/2013   Diabetic foot ulcer ([place])  09/15/2013   Poorly controlled type 2 diabetes mellitus with complication ([place]) [id number]   Past Medical History:  Diagnosis Date   Arthritis    bilateral hips   Cutaneous abscess of left foot    Deep vein thrombosis (DVT) (HCC)    Diabetes mellitus    type II   Diabetic ulcer of heel (HCC)    Right heel   DJD (degenerative joint disease)    DVT (deep venous thrombosis) ([place]) 02/08/2014   Proximal provoked. Date of diagnosis February 08 2014 Duration of anticoagulation: 6 months. End date 08/12/2014.  Anticoagulant: Lovenox 120 units daily Switched to Eliquis on 05/25/2014      Ear drum perforation, right 03/06/2019   Non-pressure chronic ulcer of right calf, limited to breakdown  of skin (HCC) 04/17/2017   Pulmonary emboli (HCC) 02/08/2014   Date of diagnosis February 08 2014, on chest CTA Hospitalized for 3 days Had some symptoms of shortness of breath, and chest pain With intercurrent DVT of the left LE. Duration of anticoagulation: 8 months. End date 10/11/2014.  Anticoagulant: Lovenox 120 units daily Switched to Eliquis on 05/25/2014 per patient preference    Pulmonary embolism (HCC)    Sebaceous cyst    on back of neck    Family History  Problem Relation Age of Onset   Breast cancer Mother    Cancer Mother        small intestine   Liver cancer Mother    Diabetes Mother    Diabetes Father    Diabetes Brother    Hypertension Maternal Grandmother    Heart Problems Maternal Grandmother    Diabetes Paternal Grandmother    Diabetes Paternal Grandfather    Diabetes Brother     Past Surgical History:  Procedure Laterality Date   AMPUTATION Right 06/03/2015   Procedure: Right Below Knee Amputation;  Surgeon: Nadara Mustard, MD;  Location: [facility] OR;  Service: Orthopedics;  Laterality: Right;   AMPUTATION Left 07/24/2019   Procedure: LEFT FOOT 3RD RAY AMPUTATION;  Surgeon: Nadara Mustard, MD;  Location[facility] OR;  Service: Orthopedics;  Laterality: Left;   AMPUTATION Left 06/23/2021   Procedure:  LEFT 2ND TOE AMPUTATION;  Surgeon: Nadara Mustard, MD;  Location[facility] OR;  Service: Orthopedics;  Laterality: Left;   AMPUTATION Right 08/21/2021   Procedure: AMPUTATION RIGHT INDEX FINGER;  Surgeon: Gomez Cleverly, MD;  Location: [facility] OR;  Service: Orthopedics;  Laterality: Right;   BUBBLE STUDY  01/24/2022   Procedure: BUBBLE STUDY;  Surgeon: Wendall Stade, MD;  Location: [facility] ENDOSCOPY;  Service: Cardiovascular;;   CLOSED REDUCTION WITH HUMER PIN INSERTION  1974   left hip   HARDWARE REMOVAL Left 07/21/2014   Procedure: HARDWARE REMOVAL;  Surgeon: Loreta Ave, MD;  Location: [facility] OR;  Service: Orthopedics;  Laterality: Left;   I & D EXTREMITY Right 08/21/2021   Procedure: IRRIGATION AND DEBRIDEMENT RIGHT INDEX FINGER;  Surgeon: Gomez Cleverly, MD;  Location: [facility] OR;  Service: Orthopedics;  Laterality: Right;   ROTATOR CUFF REPAIR Right 2005 (approx)   TEE WITHOUT CARDIOVERSION N/A 01/24/2022   Procedure: TRANSESOPHAGEAL ECHOCARDIOGRAM (TEE);  Surgeon: Wendall Stade, MD;  Location: [facility] ENDOSCOPY;  Service: Cardiovascular;  Laterality: N/A;   TOTAL HIP ARTHROPLASTY Left 07/21/2014   Procedure: TOTAL HIP ARTHROPLASTY ANTERIOR APPROACH;  Surgeon: Loreta Ave, MD;  Location: [facility] OR;  Service: Orthopedics;  Laterality: Left;   TOTAL HIP ARTHROPLASTY Right 2006 (approx)   right hip replaced   Social History   Occupational History   Occupation: disabled  Tobacco Use   Smoking status: Never   Smokeless tobacco: Never  Vaping Use   Vaping Use: Never used  Substance and Sexual Activity   Alcohol use: Not Currently    Comment: beer and mixed drink maybe 5  times a month   Drug use: No   Sexual activity: Never

## 2022-01-29 ENCOUNTER — Telehealth: Payer: Self-pay | Admitting: Cardiovascular Disease

## 2022-01-29 NOTE — Telephone Encounter (Signed)
Patient is calling requesting his TEE Echo results. Please advise.

## 2022-01-31 NOTE — Telephone Encounter (Signed)
Reviewed results with patient who verbalized understanding.   The patient agrees to PFO closure 02/28/22 but is not at home to write down anything or to see his schedule to arrange pre-procedure visit. He requests a call back tomorrow to schedule visit and to discuss further. He was grateful for assistance.

## 2022-01-31 NOTE — Telephone Encounter (Signed)
-----   Message from Tonny Bollman, MD sent at 01/31/2022 12:20 AM EDT ----- He has a large PFO, probably should be closed. Can you get him back in to set up for PFO closure? Thx Katy ----- Message ----- From: Henrietta Dine, RN Sent: 01/25/2022  11:57 AM EDT To: Tonny Bollman, MD  [name] - not sure where Mr. Wisdom's TEE dropped but I didn't see it in KT's or your basket. You have already seen him for PFO consult. Let me know how to proceed. Thanks! Orpha Bur

## 2022-02-01 NOTE — Telephone Encounter (Signed)
   Pt is calling back and requesting to speak with Gregory May again about his upcoming appt

## 2022-02-01 NOTE — Telephone Encounter (Signed)
Scheduled the patient for pre-procedure visit with Gregory May on 02/19/2022. He understands he will get blood work, EKG, and instruction letter for PFO closure on 02/28/2022. He has confirmed a ride to appointments.  He was grateful for assistance.

## 2022-02-09 ENCOUNTER — Other Ambulatory Visit: Payer: Self-pay | Admitting: Endocrinology

## 2022-02-09 ENCOUNTER — Other Ambulatory Visit: Payer: Self-pay

## 2022-02-09 MED ORDER — ROSUVASTATIN CALCIUM 20 MG PO TABS: 20.0000 mg | ORAL_TABLET | Freq: Every day | ORAL | 1 refills | Status: DC

## 2022-02-09 NOTE — Telephone Encounter (Addendum)
Will refill, thanks

## 2022-02-14 ENCOUNTER — Telehealth: Payer: Self-pay | Admitting: Internal Medicine

## 2022-02-14 ENCOUNTER — Other Ambulatory Visit (HOSPITAL_COMMUNITY): Payer: Self-pay

## 2022-02-14 NOTE — Telephone Encounter (Signed)
Tier exception appeal request denied. Reason: there are no lower cost brand name drugs for your condition.   Letter scanned to pt's chart AND attached to sample that pt will pickup.

## 2022-02-14 NOTE — Telephone Encounter (Signed)
Faxed patients tier exception appeal request to [facility]. Patient aware.  2 weeks of samples also provided. Pt will come in himself to pickup, or have his brother come by.  Medication Samples have been provided to the patient.  Drug name: XARELTO       Strength: 20MG         Qty: 14  LOT: [id number]  Exp.Date: 05/2023  Dosing instructions: TAKE 1 TABLET BY MOUTH WITH SUPPER  06/2023 Sundae Maners 1:11 PM 02/14/2022

## 2022-02-14 NOTE — Telephone Encounter (Signed)
Pt requesting a call back about coming in to pick up samples of the following medication.  The patient states that Mayer Masker is supposed to be contacting him back about helping him with the medication as well.   rivaroxaban (XARELTO) 20 MG TABS tablet

## 2022-02-16 ENCOUNTER — Other Ambulatory Visit: Payer: Self-pay | Admitting: Student

## 2022-02-16 MED ORDER — ROSUVASTATIN CALCIUM 20 MG PO TABS: 20.0000 mg | ORAL_TABLET | Freq: Every day | ORAL | 2 refills | Status: DC

## 2022-02-16 NOTE — H&P (View-Only) (Signed)
[facility]   MULTIDISCIPLINARY HEART VALVE CLINIC                                     Cardiology Office Note:    Date:  02/20/2022   ID:  Gregory May, DOB 02/18/1961, MRN [id number]  PCP:  Gwenevere Abbot, MD  [facility] Cardiologist:  None  [facility] Electrophysiologist:  None   Referring MD: [name], MD   Chief Complaint  Patient presents with   Pre-op Exam    Pre PFO assessment    History of Present Illness:    Gregory May is a 61 y.o. male with a hx of DVT/PE in 2015 treated with 8 months of OAC, uncontrolled DMT2, PAD s/p R BKA, HTN, HLD, and recent CVA with diagnosis of a chronic DVT and PFO with atrial septal aneurysm who presents to clinic for follow up prior to PFO  closure.    The patient was recently hospitalized in 11/2021 with sudden loss of vision in the right eye. 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 CTA study showed no evidence of carotid occlusive disease. The patient was noted to have aortic atherosclerosis involving the aortic arch. The patient's hypercoagulable panel was negative and notably absent for lupus anticoagulant, factor V Leiden mutation, and prothrombin gene mutation.  Protein C and protein S were normal. Echo with bubble showed a PFO with an atrial septal aneurysm. The patient was also diagnosed with chronic lower extremity DVT during the same hospitalization. He has followed up with neurology and insurance did not approve the suggested 30 day cardiac monitoring.    He was seen in the office by Dr Excell Seltzer on 12/02/21 for consideration of PFO closure. While his ROPE Score was notably low at 4, suggesting low probability of PFO-related stroke, the clinical scenario with suggestion of cardioembolic event, diagnosis of chronic DVT, and PFO with atrial septal aneurysm is concerning for the possibility of a PFO-related stroke. It was  recommended that he continue on Eliquis and proceed with TEE to further define his PFO anatomy, quantitate the size of PFO and shunt, and confirm the presence of atrial septal aneurysm.   TEE performed 01/24/22 which showed large mobile atrial septal aneurysm with large PFO at inferior/anterior margin with ling tunnel gap as large as 0.75 cm.   Today he is here alone and states that he has been doing well. He denies chest pain, palpitations, orthopnea, LE edema, SOB, dizziness, bleeding in stool or urine, or syncope. He has many questions about his upcoming procedure, all of which were answered to the best of my ability.     Past Medical History:  Diagnosis Date   Arthritis    bilateral hips   Cutaneous abscess of left foot    Deep vein thrombosis (DVT) (HCC)    Diabetes mellitus    type II   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 Date of diagnosis February 08 2014 Duration of anticoagulation: 6 months. End date 08/12/2014.  Anticoagulant: Lovenox 120 units daily Switched to Eliquis on 05/25/2014      Ear drum perforation, right 03/06/2019   Non-pressure chronic ulcer of right calf, limited to breakdown of skin (HCC)  04/17/2017   Pulmonary emboli (HCC) 02/08/2014   Date of diagnosis February 08 2014, on chest CTA Hospitalized for 3 days Had some symptoms of shortness of breath, and chest pain With intercurrent DVT of the left LE. Duration of anticoagulation: 8 months. End date 10/11/2014.  Anticoagulant: Lovenox 120 units daily Switched to Eliquis on 05/25/2014 per patient preference    Pulmonary embolism (HCC)    Sebaceous cyst    on back of neck    Past Surgical History:  Procedure Laterality Date   AMPUTATION Right 06/03/2015   Procedure: Right Below Knee Amputation;  Surgeon: Nadara Mustard, MD;  Location: [facility] OR;  Service: Orthopedics;  Laterality: Right;   AMPUTATION Left 07/24/2019    Procedure: LEFT FOOT 3RD RAY AMPUTATION;  Surgeon: Nadara Mustard, MD;  Location: [facility] OR;  Service: Orthopedics;  Laterality: Left;   AMPUTATION Left 06/23/2021   Procedure: LEFT 2ND TOE AMPUTATION;  Surgeon: Nadara Mustard, MD;  Location: [facility] OR;  Service: Orthopedics;  Laterality: Left;   AMPUTATION Right 08/21/2021   Procedure: AMPUTATION RIGHT INDEX FINGER;  Surgeon: Gomez Cleverly, MD;  Location: [facility] OR;  Service: Orthopedics;  Laterality: Right;   BUBBLE STUDY  01/24/2022   Procedure: BUBBLE STUDY;  Surgeon: Wendall Stade, MD;  Location: [facility] ENDOSCOPY;  Service: Cardiovascular;;   CLOSED REDUCTION WITH HUMER PIN INSERTION  1974   left hip   HARDWARE REMOVAL Left 07/21/2014   Procedure: HARDWARE REMOVAL;  Surgeon: Loreta Ave, MD;  Location: [facility] OR;  Service: Orthopedics;  Laterality: Left;   I & D EXTREMITY Right 08/21/2021   Procedure: IRRIGATION AND DEBRIDEMENT RIGHT INDEX FINGER;  Surgeon: Gomez Cleverly, MD;  Location: [facility] OR;  Service: Orthopedics;  Laterality: Right;   ROTATOR CUFF REPAIR Right 2005 (approx)   TEE WITHOUT CARDIOVERSION N/A 01/24/2022   Procedure: TRANSESOPHAGEAL ECHOCARDIOGRAM (TEE);  Surgeon: Wendall Stade, MD;  Location: [facility] ENDOSCOPY;  Service: Cardiovascular;  Laterality: N/A;   TOTAL HIP ARTHROPLASTY Left 07/21/2014   Procedure: TOTAL HIP ARTHROPLASTY ANTERIOR APPROACH;  Surgeon: Loreta Ave, MD;  Location: [facility] OR;  Service: Orthopedics;  Laterality: Left;   TOTAL HIP ARTHROPLASTY Right 2006 (approx)   right hip replaced    Current Medications: Current Meds  Medication Sig   Accu-Chek FastClix Lancets MISC Use Accu Chek Fastclix lancets to check blood sugar three times daily. DX:E11.65   ACCU-CHEK GUIDE test strip TEST BLOOD SUGAR THREE TIMES DAILY AS DIRECTED   acetaminophen (TYLENOL) 500 MG tablet Take 500-1,000 mg by mouth at bedtime as needed for mild pain (pain).   empagliflozin (JARDIANCE) 25 MG TABS tablet Take 1 tablet (25 mg total) by mouth daily.    fluticasone (FLONASE) 50 MCG/ACT nasal spray Place 2 sprays into both nostrils at bedtime as needed (sinus drainage).   gabapentin (NEURONTIN) 300 MG capsule TAKE 1 CAPSULE FOUR TIMES DAILY AS NEEDED   insulin aspart (NOVOLOG) 100 UNIT/ML injection Inject 16-20 units twice a day (Max daily dose 40 units) (Patient taking differently: Inject 16-20 Units into the skin 3 (three) times daily with meals. Sliding scale)   insulin degludec (TRESIBA FLEXTOUCH) 100 UNIT/ML FlexTouch Pen Inject up to 12 units at bedtime   insulin degludec (TRESIBA FLEXTOUCH) 100 UNIT/ML FlexTouch Pen Inject 6 Units into the skin at bedtime.   Insulin Syringes, Disposable, U-100 0.5 ML MISC Use to inject insulin   metFORMIN (GLUCOPHAGE-XR) 750 MG 24 hr tablet TAKE 2 TABLETS EVERY DAY (Patient taking differently: Take 1,500 mg  by mouth daily with breakfast.)   Multiple Vitamin (MULTIVITAMIN WITH MINERALS) TABS tablet Take 1 tablet by mouth every morning. Centrum   pioglitazone (ACTOS) 30 MG tablet TAKE 1 TABLET EVERY DAY (Patient taking differently: Take 30 mg by mouth daily.)   rivaroxaban (XARELTO) 20 MG TABS tablet Take 1 tablet (20 mg total) by mouth daily with supper.   rosuvastatin (CRESTOR) 20 MG tablet Take 1 tablet (20 mg total) by mouth daily.     Allergies:   Patient has no known allergies.   Social History   Socioeconomic History   Marital status: Single    Spouse name: Not on file   Number of children: 0   Years of education: Not on file   Highest education level: Not on file  Occupational History   Occupation: disabled  Tobacco Use   Smoking status: Never   Smokeless tobacco: Never  Vaping Use   Vaping Use: Never used  Substance and Sexual Activity   Alcohol use: Not Currently    Comment: beer and mixed drink maybe 5  times a month   Drug use: No   Sexual activity: Never  Other Topics Concern   Not on file  Social History Narrative   Not on file   Social Determinants of Health   Financial  Resource Strain: Low Risk  (12/15/2021)   Overall Financial Resource Strain (CARDIA)    Difficulty of Paying Living Expenses: Not hard at all  Food Insecurity: No Food Insecurity (12/15/2021)   Hunger Vital Sign    Worried About Running Out of Food in the Last Year: Never true    Ran Out of Food in the Last Year: Never true  Transportation Needs: Unmet Transportation Needs (12/15/2021)   PRAPARE - Transportation    Lack of Transportation (Medical): Yes    Lack of Transportation (Non-Medical): Yes  Physical Activity: Inactive (12/15/2021)   Exercise Vital Sign    Days of Exercise per Week: 0 days    Minutes of Exercise per Session: 0 min  Stress: No Stress Concern Present (12/15/2021)   [employer] of Occupational Health - Occupational Stress Questionnaire    Feeling of Stress : Not at all  Social Connections: Socially Isolated (12/15/2021)   Social Connection and Isolation Panel [NHANES]    Frequency of Communication with Friends and Family: More than three times a week    Frequency of Social Gatherings with Friends and Family: More than three times a week    Attends Religious Services: Never    Database administrator or Organizations: No    Attends Engineer, structural: Never    Marital Status: Never married     Family History: The patient's family history includes Breast cancer in his mother; Cancer in his mother; Diabetes in his brother, brother, father, mother, paternal grandfather, and paternal grandmother; Heart Problems in his maternal grandmother; Hypertension in his maternal grandmother; Liver cancer in his mother.  ROS:   Please see the history of present illness.    All other systems reviewed and are negative.  EKGs/Labs/Other Studies Reviewed:    The following studies were reviewed today:  TEE 01/24/22:   1. Large mobile atrial septal aneursym with large PFO at  inferior/anterior margin with ling tunnel Gap as large as 0.75 cm.   2. Patient has been referred  to Dr Excell Seltzer to consider percutaneous  closure in setting of multiple previous TIA;s.   3. Left ventricular ejection fraction, by estimation, is 55 to 60%.  The  left ventricle has normal function.   4. Right ventricular systolic function is normal. The right ventricular  size is normal.   5. Left atrial size was mildly dilated. No left atrial/left atrial  appendage thrombus was detected.   6. The mitral valve is abnormal. Mild mitral valve regurgitation.   7. The aortic valve is tricuspid. There is mild calcification of the  aortic valve. Aortic valve regurgitation is not visualized. Aortic valve  sclerosis is present, with no evidence of aortic valve stenosis.   8. Evidence of atrial level shunting detected by color flow Doppler.  Agitated saline contrast bubble study was positive with shunting observed  within 3-6 cardiac cycles suggestive of interatrial shunt.   Echo with bubble 11/16/21 IMPRESSIONS   1. Left ventricular ejection fraction, by estimation, is 60 to 65%. The  left ventricle has normal function. The left ventricle has no regional  wall motion abnormalities. Left ventricular diastolic parameters were  normal.   2. Right ventricular systolic function is normal. The right ventricular  size is normal. Tricuspid regurgitation signal is inadequate for assessing  PA pressure.   3. The mitral valve is abnormal. Trivial mitral valve regurgitation.   4. The aortic valve is tricuspid. Aortic valve regurgitation is not  visualized. Aortic valve sclerosis/calcification is present, without any  evidence of aortic stenosis. Aortic valve mean gradient measures 6.0 mmHg.   5. Aortic dilatation noted. There is borderline dilatation of the aortic  root, measuring 38 mm.   6. The inferior vena cava is normal in size with greater than 50%  respiratory variability, suggesting right atrial pressure of 3 mmHg.   7. Aneurysmal interatrial septum. Evidence of atrial level shunting  detected by  color flow Doppler. Agitated saline contrast bubble study was  positive with shunting observed within 3-6 cardiac cycles suggestive of  interatrial shunt. There is a  moderately sized patent foramen ovale with predominantly right to left  shunting across the atrial septum.   Comparison(s): No prior Echocardiogram.   Conclusion(s)/Recommendation(s): Recommend structural heart team  evaluation for possible PFO closure.  _______________________     MRI Brain 11/16/21: IMPRESSION: Multiple small 3-5 mm areas of acute infarct in the right MCA territory. Findings suggests emboli. Review of the CTA reveals no source in the aortic arch or right carotid.   Atrophy and chronic ischemic changes.   _______________________   CTA Chest 11/15/21: AORTIC ARCH:   There is calcific atherosclerosis of the aortic arch. There is no aneurysm, dissection or hemodynamically significant stenosis of the visualized portion of the aorta. Conventional 3 vessel aortic branching pattern. The visualized proximal subclavian arteries are widely patent.   RIGHT CAROTID SYSTEM: No dissection, occlusion or aneurysm. Mild atherosclerotic calcification at the carotid bifurcation without hemodynamically significant stenosis.   LEFT CAROTID SYSTEM: No dissection, occlusion or aneurysm. Mild atherosclerotic calcification at the carotid bifurcation without hemodynamically significant stenosis.   VERTEBRAL ARTERIES: Left dominant configuration. Both origins are clearly patent. There is no dissection, occlusion or flow-limiting stenosis to the skull base (V1-V3 segments).   CTA HEAD FINDINGS   POSTERIOR CIRCULATION:   --Vertebral arteries: Normal V4 segments.   --Inferior cerebellar arteries: Normal.   --Basilar artery: Normal.   --Superior cerebellar arteries: Normal.   --Posterior cerebral arteries (PCA): Normal.   ANTERIOR CIRCULATION:   --Intracranial internal carotid arteries: Normal.   --Anterior  cerebral arteries (ACA): Normal. Both A1 segments are present. Patent anterior communicating artery (a-comm).   --Middle cerebral arteries (MCA): Normal.  VENOUS SINUSES: As permitted by contrast timing, patent.   ANATOMIC VARIANTS: None   Review of the MIP images confirms the above findings.   IMPRESSION: 1. No emergent large vessel occlusion or high-grade stenosis of the intracranial or cervical arteries.   Aortic Atherosclerosis (ICD10-I70.0).   EKG:  EKG is ordered today.  The ekg ordered today demonstrates sinus bradycardia  Recent Labs: 11/15/2021: Magnesium 1.9 11/29/2021: ALT 61 02/19/2022: BUN 17; Creatinine, Ser 0.92; Hemoglobin 15.8; Platelets 291; Potassium 4.5; Sodium 140  Recent Lipid Panel    Component Value Date/Time   CHOL 136 11/29/2021 1503   TRIG 79.0 11/29/2021 1503   HDL 53.80 11/29/2021 1503   CHOLHDL 3 11/29/2021 1503   VLDL 15.8 11/29/2021 1503   LDLCALC 67 11/29/2021 1503   LDLDIRECT 91.0 07/11/2015 1329   Physical Exam:    VS:  BP 100/74   Pulse (!) 59   Ht 6' (1.829 m)   Wt 200 lb (90.7 kg)   SpO2 96%   BMI 27.12 kg/m     Wt Readings from Last 3 Encounters:  02/19/22 200 lb (90.7 kg)  01/17/22 200 lb (90.7 kg)  01/15/22 197 lb 8 oz (89.6 kg)    General: Well developed, well nourished, NAD Lungs:Clear to ausculation bilaterally. No wheezes, rales, or rhonchi. Breathing is unlabored. Cardiovascular: RRR with S1 S2. No murmurs Extremities: No edema. Neuro: Alert and oriented. No focal deficits. No facial asymmetry. MAE spontaneously. Psych: Responds to questions appropriately with normal affect.    ASSESSMENT/PLAN:    Hx of CVA with known PFO with atrial septal aneurysm: TEE 6/14 with Dr. Eden EmmsNishan showed large mobile atrial septal aneursym with large PFO at inferior/anterior margin with ling tunnel gap as large as 0.75 cm. He is now scheduled for PFO closure 7/19 with Dr. Excell Seltzerooper. All questions answered and patient is ready to proceed.  Obtain BMET, CBC today. EKG with SB.    HTN: Stable with no changes needed today.    Chronic DVT: Continue Xarelto and follow holding instructions for PFO closure. Will plan to continue to monitor given known chronic DVT.   Medication Adjustments/Labs and Tests Ordered: Current medicines are reviewed at length with the patient today.  Concerns regarding medicines are outlined above.  Orders Placed This Encounter  Procedures   Basic metabolic panel   CBC   EKG 12-Lead   No orders of the defined types were placed in this encounter.   Patient Instructions  Medication Instructions:  Your physician recommends that you continue on your current medications as directed. Please refer to the Current Medication list given to you today.  *If you need a refill on your cardiac medications before your next appointment, please call your pharmacy*  Lab Work: TODAY: BMET and CBC If you have labs (blood work) drawn today and your tests are completely normal, you will receive your results only by: MyChart Message (if you have MyChart) OR A paper copy in the mail If you have any lab test that is abnormal or we need to change your treatment, we will call you to review the results.  Follow-Up: [facility], you and your health needs are our priority.  As part of our continuing mission to provide you with exceptional heart care, we have created designated Provider Care Teams.  These Care Teams include your primary Cardiologist (physician) and Advanced Practice Providers (APPs -  Physician Assistants and Nurse Practitioners) who all work together to provide you with the care you need, when  you need it.  Your next appointment:   Wednesday, August 16th at 10:45am  The format for your next appointment:   In Person  Provider:   Georgie Chard, NP   Important Information About Sugar         Signed, Georgie Chard, NP  02/20/2022 8:43 AM    [facility]

## 2022-02-16 NOTE — Progress Notes (Unsigned)
[facility]   MULTIDISCIPLINARY HEART VALVE CLINIC                                     Cardiology Office Note:    Date:  02/16/2022   ID:  Gregory May, DOB 09-25-1960, MRN [id number]  PCP:  Gregory Abbot, MD  [facility] Cardiologist:  None  [facility] Electrophysiologist:  None   Referring MD: Gregory Filbert, MD   No chief complaint on file. ***  History of Present Illness:    Gregory May is a 61 y.o. male with a hx of DVT/PE in 2015 treated with 8 months of OAC, uncontrolled DMT2, PAD s/p R BKA, HTN, HLD, and recent CVA with diagnosis of a chronic DVT and PFO with atrial septal aneurysm who presents to clinic for follow up.    The patient was recently hospitalized in 11/2021 with sudden loss of vision in the right eye. 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  CTA study showed no evidence of carotid occlusive disease.  The patient was noted to have aortic atherosclerosis involving the aortic arch. The patient's hypercoagulable panel was negative and notably absent for lupus anticoagulant, factor V Leiden mutation, and prothrombin gene mutation.  Protein C and protein S were normal. Echo with bubble showed a PFO with an atrial septal aneurysm. The patient was also diagnosed with chronic lower extremity DVT during the same hospitalization. He has followed up with neurology and insurance did not approve the suggested 30 day cardiac monitoring.    He was seen in the office by Dr Excell Seltzer on 12/02/21 for consideration of PFO closure. While his ROPE Score was notably low at 4, suggesting low probability of PFO-related stroke, the clinical scenario with suggestion of cardioembolic event, diagnosis of chronic DVT, and PFO with atrial septal aneurysm is concerning for the possibility of a PFO-related stroke. It was recommended that he continue on Eliquis and proceed with TEE to further  define his PFO anatomy, quantitate the size of PFO and shunt, and confirm the presence of atrial septal aneurysm.   TEE performed 01/24/22 which showed large mobile atrial septal aneurysm with large PFO at inferior/anterior margin with ling tunnel gap as large as 0.75 cm.       Hx of CVA with known PFO with atrial septal aneurysm: TEE 6/14 with Dr. Eden May showed large mobile atrial septal aneursym with large PFO at inferior/anterior margin with ling tunnel gap as large as 0.75 cm. He is now schedule for PFO closure 6/21 with Dr. Excell Seltzer. Obtain BMET, CBC, EKG, give letter     HTN: BP well controlled. No changes made.    Chronic DVT: continue Xarelto. He is currently only getting this through samples through [facility] internal medicine.  He does have some pain and swelling behind left knee. Hurts with rest and exertion. Read PAD sign and wondered if it could be that.  Although he does have significant RFs for PAD, the symptoms are not typical for this. Will plan to continue to monitor given known chronic DVT.    Past Medical History:  Diagnosis Date   Arthritis    bilateral hips   Cutaneous abscess of left foot    Deep vein thrombosis (DVT) (HCC)    Diabetes mellitus    type II   Diabetic ulcer of heel (  HCC)    Right heel   DJD (degenerative joint disease)    DVT (deep venous thrombosis) (HCC) 02/08/2014   Proximal provoked. Date of diagnosis February 08 2014 Duration of anticoagulation: 6 months. End date 08/12/2014.  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 Duration of anticoagulation: 8 months. End date 10/11/2014.  Anticoagulant: Lovenox  120 units daily Switched to Eliquis on 05/25/2014 per patient preference    Pulmonary embolism (HCC)    Sebaceous cyst    on back of neck    Past Surgical History:  Procedure Laterality Date   AMPUTATION Right 06/03/2015   Procedure: Right Below Knee Amputation;  Surgeon: Nadara Mustard, MD;  Location: [facility] OR;  Service: Orthopedics;  Laterality: Right;   AMPUTATION Left 07/24/2019   Procedure: LEFT FOOT 3RD RAY AMPUTATION;  Surgeon: Nadara Mustard, MD;  Location: [facility] OR;  Service: Orthopedics;  Laterality: Left;   AMPUTATION Left 06/23/2021   Procedure: LEFT 2ND TOE AMPUTATION;  Surgeon: Nadara Mustard, MD;  Location: [facility] OR;  Service: Orthopedics;  Laterality: Left;   AMPUTATION Right 08/21/2021   Procedure: AMPUTATION RIGHT INDEX FINGER;  Surgeon: Gomez Cleverly, MD;  Location: [facility] OR;  Service: Orthopedics;  Laterality: Right;   BUBBLE STUDY  01/24/2022   Procedure: BUBBLE STUDY;  Surgeon: Wendall Stade, MD;  Location: [facility] ENDOSCOPY;  Service: Cardiovascular;;   CLOSED REDUCTION WITH HUMER PIN INSERTION  1974   left hip   HARDWARE REMOVAL Left 07/21/2014   Procedure: HARDWARE REMOVAL;  Surgeon: Loreta Ave, MD;  Location: [facility] OR;  Service: Orthopedics;  Laterality: Left;   I & D EXTREMITY Right 08/21/2021   Procedure: IRRIGATION AND DEBRIDEMENT RIGHT INDEX FINGER;  Surgeon: Gomez Cleverly, MD;  Location: [facility] OR;  Service: Orthopedics;  Laterality: Right;   ROTATOR CUFF REPAIR Right 2005 (approx)   TEE WITHOUT CARDIOVERSION N/A 01/24/2022   Procedure: TRANSESOPHAGEAL ECHOCARDIOGRAM (TEE);  Surgeon: Wendall Stade, MD;  Location: [facility] ENDOSCOPY;  Service: Cardiovascular;  Laterality: N/A;   TOTAL HIP ARTHROPLASTY Left 07/21/2014   Procedure: TOTAL HIP ARTHROPLASTY ANTERIOR APPROACH;  Surgeon: Loreta Ave, MD;  Location: [facility] OR;  Service: Orthopedics;  Laterality: Left;   TOTAL HIP ARTHROPLASTY Right 2006 (approx)   right hip replaced    Current Medications: No outpatient medications have  been marked as taking for the 02/19/22 encounter (Appointment) with [facility].     Allergies:   Patient has no known allergies.   Social History   Socioeconomic History   Marital status: Single    Spouse name: Not on file   Number of children: 0   Years of education: Not on file   Highest education level: Not on file  Occupational History   Occupation: disabled  Tobacco Use   Smoking status: Never   Smokeless tobacco: Never  Vaping Use   Vaping Use: Never used  Substance and Sexual Activity   Alcohol use: Not Currently    Comment: beer and mixed drink maybe 5  times a month   Drug use: No   Sexual activity: Never  Other Topics  Concern   Not on file  Social History Narrative   Not on file   Social Determinants of Health   Financial Resource Strain: Low Risk  (12/15/2021)   Overall Financial Resource Strain (CARDIA)    Difficulty of Paying Living Expenses: Not hard at all  Food Insecurity: No Food Insecurity (12/15/2021)   Hunger Vital Sign    Worried About Running Out of Food in the Last Year: Never true    Ran Out of Food in the Last Year: Never true  Transportation Needs: Unmet Transportation Needs (12/15/2021)   PRAPARE - Administrator, Civil Service (Medical): Yes    Lack of Transportation (Non-Medical): Yes  Physical Activity: Inactive (12/15/2021)   Exercise Vital Sign    Days of Exercise per Week: 0 days    Minutes of Exercise per Session: 0 min  Stress: No Stress Concern Present (12/15/2021)   [employer] of Occupational Health - Occupational Stress Questionnaire    Feeling of Stress : Not at all  Social Connections: Socially Isolated (12/15/2021)   Social Connection and Isolation Panel [NHANES]    Frequency of Communication with Friends and Family: More than three times a week    Frequency of Social Gatherings with Friends and Family: More than three times a week    Attends Religious Services: Never    Database administrator or  Organizations: No    Attends Engineer, structural: Never    Marital Status: Never married     Family History: The patient's ***family history includes Breast cancer in his mother; Cancer in his mother; Diabetes in his brother, brother, father, mother, paternal grandfather, and paternal grandmother; Heart Problems in his maternal grandmother; Hypertension in his maternal grandmother; Liver cancer in his mother.  ROS:   Please see the history of present illness.    All other systems reviewed and are negative.  EKGs/Labs/Other Studies Reviewed:    The following studies were reviewed today:  TEE 01/24/22:   1. Large mobile atrial septal aneursym with large PFO at  inferior/anterior margin with ling tunnel Gap as large as 0.75 cm.   2. Patient has been referred to Dr Excell Seltzer to consider percutaneous  closure in setting of multiple previous TIA;s.   3. Left ventricular ejection fraction, by estimation, is 55 to 60%. The  left ventricle has normal function.   4. Right ventricular systolic function is normal. The right ventricular  size is normal.   5. Left atrial size was mildly dilated. No left atrial/left atrial  appendage thrombus was detected.   6. The mitral valve is abnormal. Mild mitral valve regurgitation.   7. The aortic valve is tricuspid. There is mild calcification of the  aortic valve. Aortic valve regurgitation is not visualized. Aortic valve  sclerosis is present, with no evidence of aortic valve stenosis.   8. Evidence of atrial level shunting detected by color flow Doppler.  Agitated saline contrast bubble study was positive with shunting observed  within 3-6 cardiac cycles suggestive of interatrial shunt.    Echo with bubble 11/16/21 IMPRESSIONS   1. Left ventricular ejection fraction, by estimation, is 60 to 65%. The  left ventricle has normal function. The left ventricle has no regional  wall motion abnormalities. Left ventricular diastolic parameters were   normal.   2. Right ventricular systolic function is normal. The right ventricular  size is normal. Tricuspid regurgitation signal is inadequate for assessing  PA pressure.   3. The mitral valve is  abnormal. Trivial mitral valve regurgitation.   4. The aortic valve is tricuspid. Aortic valve regurgitation is not  visualized. Aortic valve sclerosis/calcification is present, without any  evidence of aortic stenosis. Aortic valve mean gradient measures 6.0 mmHg.   5. Aortic dilatation noted. There is borderline dilatation of the aortic  root, measuring 38 mm.   6. The inferior vena cava is normal in size with greater than 50%  respiratory variability, suggesting right atrial pressure of 3 mmHg.   7. Aneurysmal interatrial septum. Evidence of atrial level shunting  detected by color flow Doppler. Agitated saline contrast bubble study was  positive with shunting observed within 3-6 cardiac cycles suggestive of  interatrial shunt. There is a  moderately sized patent foramen ovale with predominantly right to left  shunting across the atrial septum.   Comparison(s): No prior Echocardiogram.   Conclusion(s)/Recommendation(s): Recommend structural heart team  evaluation for possible PFO closure.  _______________________     MRI Brain 11/16/21: IMPRESSION: Multiple small 3-5 mm areas of acute infarct in the right MCA territory. Findings suggests emboli. Review of the CTA reveals no source in the aortic arch or right carotid.   Atrophy and chronic ischemic changes.     _______________________   CTA Chest 11/15/21: AORTIC ARCH:   There is calcific atherosclerosis of the aortic arch. There is no aneurysm, dissection or hemodynamically significant stenosis of the visualized portion of the aorta. Conventional 3 vessel aortic branching pattern. The visualized proximal subclavian arteries are widely patent.   RIGHT CAROTID SYSTEM: No dissection, occlusion or aneurysm. Mild atherosclerotic  calcification at the carotid bifurcation without hemodynamically significant stenosis.   LEFT CAROTID SYSTEM: No dissection, occlusion or aneurysm. Mild atherosclerotic calcification at the carotid bifurcation without hemodynamically significant stenosis.   VERTEBRAL ARTERIES: Left dominant configuration. Both origins are clearly patent. There is no dissection, occlusion or flow-limiting stenosis to the skull base (V1-V3 segments).   CTA HEAD FINDINGS   POSTERIOR CIRCULATION:   --Vertebral arteries: Normal V4 segments.   --Inferior cerebellar arteries: Normal.   --Basilar artery: Normal.   --Superior cerebellar arteries: Normal.   --Posterior cerebral arteries (PCA): Normal.   ANTERIOR CIRCULATION:   --Intracranial internal carotid arteries: Normal.   --Anterior cerebral arteries (ACA): Normal. Both A1 segments are present. Patent anterior communicating artery (a-comm).   --Middle cerebral arteries (MCA): Normal.   VENOUS SINUSES: As permitted by contrast timing, patent.   ANATOMIC VARIANTS: None   Review of the MIP images confirms the above findings.   IMPRESSION: 1. No emergent large vessel occlusion or high-grade stenosis of the intracranial or cervical arteries.   Aortic Atherosclerosis (ICD10-I70.0).    EKG:  EKG is *** ordered today.  The ekg ordered today demonstrates ***  Recent Labs: 11/15/2021: Magnesium 1.9 11/29/2021: ALT 61 01/17/2022: BUN 15; Creatinine, Ser 0.87; Hemoglobin 15.2; Platelets 297; Potassium 4.7; Sodium 140  Recent Lipid Panel    Component Value Date/Time   CHOL 136 11/29/2021 1503   TRIG 79.0 11/29/2021 1503   HDL 53.80 11/29/2021 1503   CHOLHDL 3 11/29/2021 1503   VLDL 15.8 11/29/2021 1503   LDLCALC 67 11/29/2021 1503   LDLDIRECT 91.0 07/11/2015 1329     Risk Assessment/Calculations:   {Does this patient have ATRIAL FIBRILLATION?:[id number]}   Physical Exam:    VS:  There were no vitals taken for this visit.    Wt  Readings from Last 3 Encounters:  01/17/22 200 lb (90.7 kg)  01/15/22 197 lb 8 oz (89.6 kg)  01/04/22 196  lb 6.4 oz (89.1 kg)     GEN: *** Well nourished, well developed in no acute distress HEENT: Normal NECK: No JVD; No carotid bruits LYMPHATICS: No lymphadenopathy CARDIAC: ***RRR, no murmurs, rubs, gallops RESPIRATORY:  Clear to auscultation without rales, wheezing or rhonchi  ABDOMEN: Soft, non-tender, non-distended MUSCULOSKELETAL:  No edema; No deformity  SKIN: Warm and dry NEUROLOGIC:  Alert and oriented x 3 PSYCHIATRIC:  Normal affect   ASSESSMENT:    No diagnosis found. PLAN:    In order of problems listed above:       {Are you ordering a CV Procedure (e.g. stress test, cath, DCCV, TEE, etc)?   Press F2        :[id number]}    Medication Adjustments/Labs and Tests Ordered: Current medicines are reviewed at length with the patient today.  Concerns regarding medicines are outlined above.  No orders of the defined types were placed in this encounter.  No orders of the defined types were placed in this encounter.   There are no Patient Instructions on file for this visit.   Signed, Georgie Chard, NP  02/16/2022 11:46 AM    [facility]

## 2022-02-19 ENCOUNTER — Ambulatory Visit (INDEPENDENT_AMBULATORY_CARE_PROVIDER_SITE_OTHER): Payer: Medicare HMO | Admitting: Cardiology

## 2022-02-19 ENCOUNTER — Telehealth: Payer: Self-pay | Admitting: *Deleted

## 2022-02-19 VITALS — BP 100/74 | HR 59 | Ht 72.0 in | Wt 200.0 lb

## 2022-02-19 DIAGNOSIS — I253 Aneurysm of heart: Secondary | ICD-10-CM | POA: Diagnosis not present

## 2022-02-19 DIAGNOSIS — Z01810 Encounter for preprocedural cardiovascular examination: Secondary | ICD-10-CM

## 2022-02-19 DIAGNOSIS — Z8673 Personal history of transient ischemic attack (TIA), and cerebral infarction without residual deficits: Secondary | ICD-10-CM | POA: Diagnosis not present

## 2022-02-19 DIAGNOSIS — Q2112 Patent foramen ovale: Secondary | ICD-10-CM | POA: Diagnosis not present

## 2022-02-19 DIAGNOSIS — I10 Essential (primary) hypertension: Secondary | ICD-10-CM | POA: Diagnosis not present

## 2022-02-19 NOTE — Telephone Encounter (Signed)
Samples below handed to patient today by [facility].

## 2022-02-19 NOTE — Telephone Encounter (Signed)
Patient called in stating his PAP is sending Jardiance 25 mg but he is out now and requesting samples.  Medication Samples will be provided to the patient.  Drug name: Jardiance   Strength: 25 mg      Qty: 2 bottles (14 tabs)  LOT: [id number] Exp.Date: 04/12/2022  Dosing instructions: Take one tab daily  Patient notified and is very appreciative.

## 2022-02-19 NOTE — Patient Instructions (Signed)
Medication Instructions:  Your physician recommends that you continue on your current medications as directed. Please refer to the Current Medication list given to you today.  *If you need a refill on your cardiac medications before your next appointment, please call your pharmacy*  Lab Work: TODAY: BMET and CBC If you have labs (blood work) drawn today and your tests are completely normal, you will receive your results only by: MyChart Message (if you have MyChart) OR A paper copy in the mail If you have any lab test that is abnormal or we need to change your treatment, we will call you to review the results.  Follow-Up: At [facility], you and your health needs are our priority.  As part of our continuing mission to provide you with exceptional heart care, we have created designated Provider Care Teams.  These Care Teams include your primary Cardiologist (physician) and Advanced Practice Providers (APPs -  Physician Assistants and Nurse Practitioners) who all work together to provide you with the care you need, when you need it.  Your next appointment:   Wednesday, August 16th at 10:45am  The format for your next appointment:   In Person  Provider:   Georgie Chard, NP   Important Information About Sugar

## 2022-02-20 LAB — CBC
Hematocrit: 45.5 % (ref 37.5–51.0)
Hemoglobin: 15.8 g/dL (ref 13.0–17.7)
MCH: 32.8 pg (ref 26.6–33.0)
MCHC: 34.7 g/dL (ref 31.5–35.7)
MCV: 94 fL (ref 79–97)
Platelets: 291 10*3/uL (ref 150–450)
RBC: 4.82 x10E6/uL (ref 4.14–5.80)
RDW: 12.9 % (ref 11.6–15.4)
WBC: 8.3 10*3/uL (ref 3.4–10.8)

## 2022-02-20 LAB — BASIC METABOLIC PANEL
BUN/Creatinine Ratio: 18 (ref 9–20)
BUN: 17 mg/dL (ref 6–24)
CO2: 25 mmol/L (ref 20–29)
Calcium: 9.9 mg/dL (ref 8.7–10.2)
Chloride: 100 mmol/L (ref 96–106)
Creatinine, Ser: 0.92 mg/dL (ref 0.76–1.27)
Glucose: 151 mg/dL — ABNORMAL HIGH (ref 70–99)
Potassium: 4.5 mmol/L (ref 3.5–5.2)
Sodium: 140 mmol/L (ref 134–144)
eGFR: 96 mL/min/{1.73_m2} (ref >59)

## 2022-02-22 ENCOUNTER — Telehealth: Payer: Self-pay | Admitting: Student

## 2022-02-22 NOTE — Telephone Encounter (Signed)
Gregory May has been following with our clinic for his anticoagulation therapy.  He has had difficulty time affording his DOACs for DVT and PE prevention despite plan to have his PFO closed, neurology still believe he needs to continue anticoagulation.  Because he is having a difficult time affording his medication we will transition him to warfarin.  He states he will be able to make these appointments regularly.  I reached out to Dr. Alexandria Lodge from our clinic who will follow with patient.  He is to continue on his current DOAC until he is able to follow-up with the clinic.  We will wait until he has the scheduled date for the Coumadin clinic and will initiate warfarin 3 days prior to this.

## 2022-02-23 ENCOUNTER — Telehealth: Payer: Self-pay | Admitting: Student

## 2022-02-23 MED ORDER — WARFARIN SODIUM 5 MG PO TABS: 5.0000 mg | ORAL_TABLET | Freq: Every day | ORAL | 0 refills | Status: DC

## 2022-02-23 NOTE — Telephone Encounter (Signed)
Please refer to message below.  Just spoke with patient. He agreed to an appointment for this coming Monday 02/26/2022 at 11:30 am in the coumadin clinic.

## 2022-02-23 NOTE — Telephone Encounter (Signed)
-----   Message from Elicia Lamp, RPH-CPP sent at 02/21/2022  3:02 PM EDT ----- Regarding: RE: Coumadin Clinic I will be happy to see him in the anticoagulation management clinic. I will ask Ch[place]etta to contact him and set up an appointment with me in the clinic for Monday 17-JUL-23. ----- Message ----- From: Belva Agee, MD Sent: 02/21/2022   1:55 PM EDT To: Elicia Lamp, RPH-CPP Subject: Coumadin Clinic                                Good Afternoon Dr. Alexandria Lodge,   I hope you are doing well and I thoroughly enjoyed your talk yesterday! One of our clinic patient's, Mr. Jipson, is unable to afford his anticoagulation with DOACs and is needing frequent samples from our clinic. He would be willing to take warfarin and attend clinic follow ups as needed. I was hoping you would be able to help [place] set  him for the coumadin clinic or home coumadin care.   Thank you!  Vasili Katsadouros

## 2022-02-26 ENCOUNTER — Other Ambulatory Visit: Payer: Self-pay

## 2022-02-26 ENCOUNTER — Telehealth: Payer: Self-pay | Admitting: Cardiology

## 2022-02-26 ENCOUNTER — Ambulatory Visit (INDEPENDENT_AMBULATORY_CARE_PROVIDER_SITE_OTHER): Payer: Medicare HMO | Admitting: Pharmacist

## 2022-02-26 DIAGNOSIS — H3410 Central retinal artery occlusion, unspecified eye: Secondary | ICD-10-CM | POA: Diagnosis not present

## 2022-02-26 DIAGNOSIS — Z8673 Personal history of transient ischemic attack (TIA), and cerebral infarction without residual deficits: Secondary | ICD-10-CM

## 2022-02-26 DIAGNOSIS — Z7901 Long term (current) use of anticoagulants: Secondary | ICD-10-CM | POA: Insufficient documentation

## 2022-02-26 DIAGNOSIS — H349 Unspecified retinal vascular occlusion: Secondary | ICD-10-CM

## 2022-02-26 DIAGNOSIS — Z01810 Encounter for preprocedural cardiovascular examination: Secondary | ICD-10-CM

## 2022-02-26 LAB — POCT INR: INR: 1.4 — AB (ref 2.0–3.0)

## 2022-02-26 NOTE — Telephone Encounter (Signed)
Will route to Structural Heart NP and Nurse Navigator.  Per IM note 7/14, pt unable to afford DOAC and has been started on warfarin.

## 2022-02-26 NOTE — Patient Instructions (Signed)
Patient instructed to take medications as defined in the Anti-coagulation Track section of this encounter.  Patient instructed  OMIT dose for Tuesday, July 18 and Wednesday, July 19 as being advised by the Structural Team overseeing his PFO procedure. Patient instructed to take one (1) of your 5mg  strength, peach-colored warfarin tablets beginning AFTER your procedure, as advised by the physician/surgeon performing your procedure.  Patient verbalized understanding of these instructions.

## 2022-02-26 NOTE — Telephone Encounter (Signed)
Pt c/o medication issue:  1. Name of Medication: rivaroxaban (XARELTO) 20 MG TABS tablet  warfarin (COUMADIN) 5 MG tablet  2. How are you currently taking this medication (dosage and times per day)? As directed   3. Are you having a reaction (difficulty breathing--STAT)? no  4. What is your medication issue? Patient wanted to know if him taking both medications would affect his upcoming procedure Wednesday

## 2022-02-26 NOTE — Telephone Encounter (Signed)
Gregory May is scheduled for PFO closure 7/19 with Dr. Excell Seltzer.  He had pre-procedure visit with Structural Heart Team 7/10 and was given instructions to hold Xarelto night before procedure. On 7/13, he called his PCP and informed them he cannot afford his Xarelto.  On 7/14, the patient started Coumadin 5 mg daily and took with his Xarelto over the weekend as directed per PCP. He had an INR check today and his INR was 1.4. He took Coumadin today.   Will send to Dr. Excell Seltzer for pre-procedure medication advisement.

## 2022-02-26 NOTE — Progress Notes (Signed)
Anticoagulation Management Gregory May is a 61 y.o. male who reports to the clinic for monitoring of warfarin treatment.    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  Of note, patient reports that he HAS TAKEN today's dose before arrival to the clinic. Duration: indefinite Supervising physician: Earl Lagos  Anticoagulation Clinic Visit History: Patient does not report signs/symptoms of bleeding or thromboembolism  Other recent changes: No diet, medications, lifestyle changes EXCEPT AS NOTED IN PATIENT FINDINGS. 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 DX:E11.65, Disp: 300 each, Rfl: 2   ACCU-CHEK GUIDE test strip, TEST BLOOD SUGAR THREE TIMES DAILY AS DIRECTED, Disp: 300 strip, Rfl: 3   acetaminophen (TYLENOL) 500 MG tablet, Take 500-1,000 mg by mouth at bedtime as needed for mild pain (pain)., Disp: , Rfl:    APPLE CIDER VINEGAR PO, Take 1 each by mouth daily. With Ginger, Disp: , Rfl:    empagliflozin (JARDIANCE) 25 MG TABS tablet, Take 1 tablet (25 mg total) by mouth daily., Disp: 90 tablet, Rfl: 3    fluticasone (FLONASE) 50 MCG/ACT nasal spray, Place 2 sprays into both nostrils at bedtime as needed (sinus drainage)., Disp: 9.9 mL, Rfl: 1   gabapentin (NEURONTIN) 300 MG capsule, TAKE 1 CAPSULE FOUR TIMES DAILY AS NEEDED (Patient taking differently: Take 600 mg by mouth 2 (two) times daily as needed (pain).), Disp: 360 capsule, Rfl: 0   insulin aspart (NOVOLOG) 100 UNIT/ML injection, Inject 16-20 units twice a day (Max daily dose 40 units) (Patient taking differently: Inject 16-20 Units into the skin 3 (three) times daily with meals. Sliding scale), Disp: 20 mL, Rfl: 0   insulin degludec (TRESIBA FLEXTOUCH) 100 UNIT/ML FlexTouch Pen, Inject up to 12 units at bedtime (Patient taking differently: Inject 6-10 Units into the skin at bedtime. Inject up to 12 units at bedtime), Disp: 30 mL, Rfl: 0   Insulin Syringes, Disposable, U-100 0.5 ML MISC, Use to inject insulin, Disp: 100 each, Rfl: 2   metFORMIN (GLUCOPHAGE-XR) 750 MG 24 hr tablet, TAKE 2 TABLETS EVERY DAY (Patient taking differently: Take 1,500 mg by mouth daily with breakfast.), Disp: 180 tablet, Rfl: 2   Multiple Vitamin (MULTIVITAMIN WITH MINERALS) TABS tablet, Take 1 tablet by mouth every morning. Centrum, Disp: , Rfl:    pioglitazone (ACTOS) 30 MG tablet, TAKE 1 TABLET EVERY DAY (Patient taking differently: Take 30 mg by mouth daily.), Disp: 90 tablet, Rfl: 1   rivaroxaban (XARELTO) 20 MG TABS tablet, Take  1 tablet (20 mg total) by mouth daily with supper. (Patient taking differently: Take 20 mg by mouth every morning.), Disp: 90 tablet, Rfl: 0   rosuvastatin (CRESTOR) 20 MG tablet, Take 1 tablet (20 mg total) by mouth daily., Disp: 90 tablet, Rfl: 2   warfarin (COUMADIN) 5 MG tablet, Take 1 tablet (5 mg total) by mouth daily., Disp: 30 tablet, Rfl: 0 Past Medical History:  Diagnosis Date   Arthritis    bilateral hips   Cutaneous abscess of left foot    Deep vein thrombosis (DVT) (HCC)    Diabetes mellitus    type II   Diabetic ulcer  of heel (HCC)    Right heel   DJD (degenerative joint disease)    DVT (deep venous thrombosis) (HCC) 02/08/2014   Proximal provoked. Date of diagnosis February 08 2014 Duration of anticoagulation: 6 months. End date 08/12/2014.  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 Duration of anticoagulation: 8 months. End date 10/11/2014.  Anticoagulant: Lovenox 120 units daily Switched to Eliquis on 05/25/2014 per patient preference    Pulmonary embolism (HCC)    Sebaceous cyst    on back of neck   Social History   Socioeconomic History   Marital status: Single    Spouse name: Not on file   Number of children: 0   Years of education: Not on file   Highest education level: Not on file  Occupational History   Occupation: disabled  Tobacco Use   Smoking status: Never   Smokeless tobacco: Never  Vaping Use   Vaping Use: Never used  Substance and Sexual Activity   Alcohol use: Not Currently    Comment: beer and mixed drink maybe 5  times a month   Drug use: No   Sexual activity: Never  Other Topics Concern   Not on file  Social History Narrative   Not on file   Social Determinants of Health   Financial Resource Strain: Low Risk  (12/15/2021)   Overall Financial Resource Strain (CARDIA)    Difficulty of Paying Living Expenses: Not hard at all  Food Insecurity: No Food Insecurity (12/15/2021)   Hunger Vital Sign    Worried About Running Out of Food in the Last Year: Never true    Ran Out of Food in the Last Year: Never true  Transportation Needs: Unmet Transportation Needs (12/15/2021)   PRAPARE - Transportation    Lack of Transportation  (Medical): Yes    Lack of Transportation (Non-Medical): Yes  Physical Activity: Inactive (12/15/2021)   Exercise Vital Sign    Days of Exercise per Week: 0 days    Minutes of Exercise per Session: 0 min  Stress: No Stress Concern Present (12/15/2021)   [employer] of Occupational Health - Occupational Stress Questionnaire    Feeling of Stress : Not at all  Social Connections: Socially Isolated (12/15/2021)   Social Connection and Isolation Panel [NHANES]    Frequency of Communication with Friends and Family: More than three times a week    Frequency of Social Gatherings with Friends and Family: More than three times a week  Attends Religious Services: Never    Active Member of Clubs or Organizations: No    Attends Banker Meetings: Never    Marital Status: Never married   Family History  Problem Relation Age of Onset   Breast cancer Mother    Cancer Mother        small intestine   Liver cancer Mother    Diabetes Mother    Diabetes Father    Diabetes Brother    Hypertension Maternal Grandmother    Heart Problems Maternal Grandmother    Diabetes Paternal Grandmother    Diabetes Paternal Grandfather    Diabetes Brother     ASSESSMENT Recent Results: The most recent result is correlated with 5 mg per day for the past 4 consecutive days.  Lab Results  Component Value Date   INR 1.0 12/21/2021   INR 1.0 11/15/2021   INR 1.0 08/19/2021    Anticoagulation Dosing:   INR today: Subtherapeutic  PLAN Weekly dose was deferred at this time to plan to be implemented by the Structural Team, who will be contacting the patient to advise. Patient DID take his warfarin dose today prior to arrival to the clinic.   Patient Instructions  Patient instructed to take medications as defined in the Anti-coagulation Track section of this encounter.  Patient instructed  OMIT dose for Tuesday, July 18 and Wednesday, July 19 as being advised by the Structural Team overseeing his  PFO procedure. Patient instructed to take one (1) of your 5mg  strength, peach-colored warfarin tablets beginning AFTER your procedure, as advised by the physician/surgeon performing your procedure.  Patient verbalized understanding of these instructions.   Patient advised to contact clinic or seek medical attention if signs/symptoms of bleeding or thromboembolism occur.  Patient verbalized understanding by repeating back information and was advised to contact me if further medication-related questions arise. Patient was also provided an information handout.  Follow-up Return in 1 week (on 03/05/2022) for Follow up INR.  03/07/2022, PharmD, CPP  15 minutes spent face-to-face with the patient during the encounter. 50% of time spent on education, including signs/sx bleeding and clotting, as well as food and drug interactions with warfarin. 50% of time was spent on fingerprick POC INR sample collection,processing, results determination, and documentation in [facility].

## 2022-02-26 NOTE — Telephone Encounter (Signed)
Spoke with the patient in detail. He states he is no longer taking Xarelto. Removed Xarelto from med list.  Per Dr. Excell Seltzer, instructed the patient to HOLD COUMADIN tomorrow. The patient will have INR checked DOS and understands if his INR is greater than 2.5 his procedure will be rescheduled to a later date.  Per patient request, will send message to Chancy Milroy to contact the patient as he needs to schedule his INR check for next week.   The patient was grateful for call and agrees with plan.

## 2022-02-26 NOTE — Telephone Encounter (Signed)
Ok to take coumadin today, hold tomorrow, check INR Wednesday am (day of procedure), and plan to proceed as long as INR < 2.5 (transvenous procedure at lower bleeding risk).

## 2022-02-26 NOTE — Progress Notes (Signed)
INTERNAL MEDICINE TEACHING ATTENDING ADDENDUM - Dearion Huot M.D  Duration- indefinite, Indication- recurrent VTE, INR- therapeutic. Agree with pharmacy recommendations as outlined in their note.     

## 2022-02-27 ENCOUNTER — Telehealth: Payer: Self-pay | Admitting: *Deleted

## 2022-02-27 NOTE — Telephone Encounter (Signed)
PFO Closure scheduled at [facility] for: Wednesday February 28, 2022 10:30 AM Arrival time and place: [facility] Main Entrance A at: 8:30 AM   Nothing to eat after midnight prior to procedure, clear liquids until 5 AM day of procedure.  Medication instructions: -Hold:  Coumadin-none 02/27/22 until post procedure  Patient will hold all other medications tomorrow morning except aspirin 81 mg. -Patient will take aspirin 81 mg tomorrow morning before going to hospital.  Confirmed patient has responsible adult to drive home post procedure and be with patient first 24 hours after arriving home.  Patient reports no new symptoms concerning for COVID-19 in the past 10 days.  Reviewed procedure instructions with patient.

## 2022-02-27 NOTE — Telephone Encounter (Signed)
Spoke with the patient. Due to cancellation, he understands to arrive at [facility] Main Entrance A- Admitting at 0630 tomorrow morning for procedure at 0830. He was grateful for call and agrees with plan.

## 2022-02-28 ENCOUNTER — Encounter (HOSPITAL_COMMUNITY)
Admission: RE | Disposition: A | Discharge status: Home / Self Care | Payer: Medicare HMO | Source: Home / Self Care | Attending: Cardiovascular Disease

## 2022-02-28 ENCOUNTER — Other Ambulatory Visit: Payer: Self-pay

## 2022-02-28 ENCOUNTER — Encounter (HOSPITAL_COMMUNITY): Payer: Self-pay | Admitting: Cardiovascular Disease

## 2022-02-28 ENCOUNTER — Ambulatory Visit (HOSPITAL_COMMUNITY)
Admission: RE | Admit: 2022-02-28 | Discharge: 2022-02-28 | Disposition: A | Discharge status: Home / Self Care | Payer: Medicare HMO | Attending: Cardiovascular Disease | Admitting: Cardiovascular Disease

## 2022-02-28 ENCOUNTER — Ambulatory Visit (HOSPITAL_BASED_OUTPATIENT_CLINIC_OR_DEPARTMENT_OTHER): Payer: Medicare HMO

## 2022-02-28 DIAGNOSIS — I739 Peripheral vascular disease, unspecified: Secondary | ICD-10-CM | POA: Insufficient documentation

## 2022-02-28 DIAGNOSIS — E119 Type 2 diabetes mellitus without complications: Secondary | ICD-10-CM | POA: Insufficient documentation

## 2022-02-28 DIAGNOSIS — Z8673 Personal history of transient ischemic attack (TIA), and cerebral infarction without residual deficits: Secondary | ICD-10-CM | POA: Diagnosis not present

## 2022-02-28 DIAGNOSIS — Z86718 Personal history of other venous thrombosis and embolism: Secondary | ICD-10-CM | POA: Diagnosis not present

## 2022-02-28 DIAGNOSIS — Z7901 Long term (current) use of anticoagulants: Secondary | ICD-10-CM | POA: Insufficient documentation

## 2022-02-28 DIAGNOSIS — I080 Rheumatic disorders of both mitral and aortic valves: Secondary | ICD-10-CM | POA: Diagnosis not present

## 2022-02-28 DIAGNOSIS — I10 Essential (primary) hypertension: Secondary | ICD-10-CM | POA: Insufficient documentation

## 2022-02-28 DIAGNOSIS — I253 Aneurysm of heart: Secondary | ICD-10-CM

## 2022-02-28 DIAGNOSIS — Z7984 Long term (current) use of oral hypoglycemic drugs: Secondary | ICD-10-CM | POA: Insufficient documentation

## 2022-02-28 DIAGNOSIS — E785 Hyperlipidemia, unspecified: Secondary | ICD-10-CM | POA: Insufficient documentation

## 2022-02-28 DIAGNOSIS — Z01812 Encounter for preprocedural laboratory examination: Secondary | ICD-10-CM

## 2022-02-28 DIAGNOSIS — Z86711 Personal history of pulmonary embolism: Secondary | ICD-10-CM | POA: Diagnosis not present

## 2022-02-28 DIAGNOSIS — Z89511 Acquired absence of right leg below knee: Secondary | ICD-10-CM | POA: Diagnosis not present

## 2022-02-28 DIAGNOSIS — Z01810 Encounter for preprocedural cardiovascular examination: Secondary | ICD-10-CM

## 2022-02-28 DIAGNOSIS — Q2112 Patent foramen ovale: Secondary | ICD-10-CM | POA: Diagnosis not present

## 2022-02-28 DIAGNOSIS — Z794 Long term (current) use of insulin: Secondary | ICD-10-CM | POA: Diagnosis not present

## 2022-02-28 HISTORY — PX: PATENT FORAMEN OVALE(PFO) CLOSURE: CATH118300

## 2022-02-28 LAB — ECHOCARDIOGRAM LIMITED
Calc EF: 63.5 %
Height: 72 in
Single Plane A2C EF: 64.7 %
Single Plane A4C EF: 60.9 %
Weight: 3296 oz

## 2022-02-28 LAB — GLUCOSE, CAPILLARY
Glucose-Capillary: 97 mg/dL (ref 70–99)
Glucose-Capillary: 110 mg/dL — ABNORMAL HIGH (ref 70–99)

## 2022-02-28 LAB — PROTIME-INR
INR: 1 (ref 0.8–1.2)
Prothrombin Time: 13.1 seconds (ref 11.4–15.2)

## 2022-02-28 LAB — POCT ACTIVATED CLOTTING TIME
Activated Clotting Time: 197 seconds
Activated Clotting Time: 221 seconds

## 2022-02-28 SURGERY — PATENT FORAMEN OVALE (PFO) CLOSURE
Anesthesia: LOCAL

## 2022-02-28 MED ORDER — HEPARIN (PORCINE) IN NACL 1000-0.9 UT/500ML-% IV SOLN
INTRAVENOUS | Status: DC | PRN
  Administered 2022-02-28 (×2): 500 mL

## 2022-02-28 MED ORDER — FENTANYL CITRATE (PF) 100 MCG/2ML IJ SOLN
INTRAMUSCULAR | Status: AC
  Filled 2022-02-28: qty 2

## 2022-02-28 MED ORDER — LABETALOL HCL 5 MG/ML IV SOLN: 10.0000 mg | INTRAVENOUS | Status: DC | PRN

## 2022-02-28 MED ORDER — SODIUM CHLORIDE 0.9 % IV SOLN: INTRAVENOUS | Status: AC

## 2022-02-28 MED ORDER — MIDAZOLAM HCL 2 MG/2ML IJ SOLN
INTRAMUSCULAR | Status: DC | PRN
  Administered 2022-02-28: 1 mg via INTRAVENOUS

## 2022-02-28 MED ORDER — SODIUM CHLORIDE 0.9% FLUSH: 3.0000 mL | INTRAVENOUS | Status: DC | PRN

## 2022-02-28 MED ORDER — LIDOCAINE HCL (PF) 1 % IJ SOLN
INTRAMUSCULAR | Status: DC | PRN
  Administered 2022-02-28: 2 mL

## 2022-02-28 MED ORDER — ACETAMINOPHEN 325 MG PO TABS: 650.0000 mg | ORAL_TABLET | ORAL | Status: DC | PRN

## 2022-02-28 MED ORDER — LIDOCAINE HCL (PF) 1 % IJ SOLN
INTRAMUSCULAR | Status: AC
  Filled 2022-02-28: qty 30

## 2022-02-28 MED ORDER — SODIUM CHLORIDE 0.9 % IV SOLN: 250.0000 mL | INTRAVENOUS | Status: DC | PRN

## 2022-02-28 MED ORDER — SODIUM CHLORIDE 0.9 % WEIGHT BASED INFUSION: 1.0000 mL/kg/h | INTRAVENOUS | Status: DC

## 2022-02-28 MED ORDER — CEFAZOLIN SODIUM-DEXTROSE 2-4 GM/100ML-% IV SOLN
2.0000 g | INTRAVENOUS | Status: DC
  Filled 2022-02-28: qty 100

## 2022-02-28 MED ORDER — ASPIRIN 81 MG PO CHEW: 81.0000 mg | CHEWABLE_TABLET | ORAL | Status: DC

## 2022-02-28 MED ORDER — FENTANYL CITRATE (PF) 100 MCG/2ML IJ SOLN
INTRAMUSCULAR | Status: DC | PRN
  Administered 2022-02-28: 25 ug via INTRAVENOUS

## 2022-02-28 MED ORDER — CEFAZOLIN SODIUM-DEXTROSE 2-4 GM/100ML-% IV SOLN
INTRAVENOUS | Status: AC
  Filled 2022-02-28: qty 100

## 2022-02-28 MED ORDER — SODIUM CHLORIDE 0.9% FLUSH: 3.0000 mL | Freq: Two times a day (BID) | INTRAVENOUS | Status: DC

## 2022-02-28 MED ORDER — CEFAZOLIN SODIUM-DEXTROSE 2-3 GM-%(50ML) IV SOLR
INTRAVENOUS | Status: DC | PRN
  Administered 2022-02-28: 2 g via INTRAVENOUS

## 2022-02-28 MED ORDER — SODIUM CHLORIDE 0.9 % WEIGHT BASED INFUSION
3.0000 mL/kg/h | INTRAVENOUS | Status: AC
  Administered 2022-02-28: 3 mL/kg/h via INTRAVENOUS

## 2022-02-28 MED ORDER — HEPARIN SODIUM (PORCINE) 1000 UNIT/ML IJ SOLN
INTRAMUSCULAR | Status: AC
Start: 2022-02-28 — End: ?
  Filled 2022-02-28: qty 10

## 2022-02-28 MED ORDER — ASPIRIN 81 MG PO TBEC: 81.0000 mg | DELAYED_RELEASE_TABLET | Freq: Every day | ORAL | 2 refills | Status: DC

## 2022-02-28 MED ORDER — ONDANSETRON HCL 4 MG/2ML IJ SOLN: 4.0000 mg | Freq: Four times a day (QID) | INTRAMUSCULAR | Status: DC | PRN

## 2022-02-28 MED ORDER — HYDRALAZINE HCL 20 MG/ML IJ SOLN: 10.0000 mg | INTRAMUSCULAR | Status: DC | PRN

## 2022-02-28 MED ORDER — HEPARIN (PORCINE) IN NACL 1000-0.9 UT/500ML-% IV SOLN
INTRAVENOUS | Status: AC
  Filled 2022-02-28: qty 1000

## 2022-02-28 MED ORDER — HEPARIN SODIUM (PORCINE) 1000 UNIT/ML IJ SOLN
INTRAMUSCULAR | Status: DC | PRN
  Administered 2022-02-28: 3000 [IU] via INTRAVENOUS
  Administered 2022-02-28: 7000 [IU] via INTRAVENOUS

## 2022-02-28 MED ORDER — MIDAZOLAM HCL 2 MG/2ML IJ SOLN
INTRAMUSCULAR | Status: AC
  Filled 2022-02-28: qty 2

## 2022-02-28 SURGICAL SUPPLY — 20 items
CATH 8FR ACUNAV REPROCESSED (CATHETERS) IMPLANT
CATH REPROCESSED 8FR ACUNAV (CATHETERS) ×2 IMPLANT
CATH SUPER TORQUE PLUS 6F MPA1 (CATHETERS) ×1 IMPLANT
CLOSURE PERCLOSE PROSTYLE (VASCULAR PRODUCTS) ×1 IMPLANT
GUIDEWIRE AMPLATZER 1.5JX260 (WIRE) ×1 IMPLANT
KIT HEART LEFT (KITS) ×2 IMPLANT
KIT MICROPUNCTURE NIT STIFF (SHEATH) ×1 IMPLANT
OCCLUDER PFO TALISMAN 25-18 (Prosthesis & Implant Heart) IMPLANT
PACK CARDIAC CATHETERIZATION (CUSTOM PROCEDURE TRAY) ×2 IMPLANT
PROTECTION STATION PRESSURIZED (MISCELLANEOUS) ×2
SHEATH DELIVERY TALISMAN 8F 80 (SHEATH) IMPLANT
SHEATH INTROD W/O MIN 9FR 25CM (SHEATH) ×1 IMPLANT
SHEATH PINNACLE 8F 10CM (SHEATH) ×1 IMPLANT
SHEATH PROBE COVER 6X72 (BAG) ×1 IMPLANT
STATION PROTECTION PRESSURIZED (MISCELLANEOUS) IMPLANT
TALISMAN DELIVERY SHEATH 8F 80 (SHEATH) ×2
TALISMAN PFO OCCLUDER 25-18 (Prosthesis & Implant Heart) ×2 IMPLANT
TRANSDUCER W/STOPCOCK (MISCELLANEOUS) ×2 IMPLANT
TUBING CIL FLEX 10 FLL-RA (TUBING) ×2 IMPLANT
WIRE EMERALD 3MM-J .035X150CM (WIRE) ×1 IMPLANT

## 2022-02-28 NOTE — Interval H&P Note (Signed)
History and Physical Interval Note:  02/28/2022 9:30 AM  Gregory May  has presented today for surgery, with the diagnosis of pfo.  The various methods of treatment have been discussed with the patient and family. After consideration of risks, benefits and other options for treatment, the patient has consented to  Procedure(s): PATENT FORAMEN OVALE(PFO) CLOSURE (N/A) as a surgical intervention.  The patient's history has been reviewed, patient examined, no change in status, stable for surgery.  I have reviewed the patient's chart and labs.  Questions were answered to the patient's satisfaction.     Tonny Bollman

## 2022-02-28 NOTE — Progress Notes (Signed)
  Echocardiogram 2D Echocardiogram has been performed.  Janalyn Harder 02/28/2022, 1:07 PM

## 2022-02-28 NOTE — Discharge Instructions (Signed)

## 2022-02-28 NOTE — Progress Notes (Signed)
  [facility]   MULTIDISCIPLINARY HEART VALVE TEAM  Patient underwent PFO closure with 88mm Abbott Talacen occluder device without complication. Groin sites have remained stable with no evidence of hematoma or bleeding in the post procedure setting. Medication plan to resume Warfarin tonight with management per PCP. He will be on ASA 81mg  QD for 3 months then stop if patient remains on Warfarin therapy. He does not go to the dentist however reviewed the need for SBE with Abx for dental cleanings and procedures. We recommend avoiding dental cleanings for at least 1 month after device placement. Will RX at follow up with myself 03/2022.   04/2022 NP-C Structural Heart Team  Pager: [phone] Phone: [phone]

## 2022-02-28 NOTE — Progress Notes (Signed)
Right groin dressing with urine noted on dressing and dressing changed; no bleeding or hematoma

## 2022-03-05 ENCOUNTER — Ambulatory Visit (INDEPENDENT_AMBULATORY_CARE_PROVIDER_SITE_OTHER): Payer: Medicare HMO | Admitting: Pharmacist

## 2022-03-05 DIAGNOSIS — H3410 Central retinal artery occlusion, unspecified eye: Secondary | ICD-10-CM

## 2022-03-05 DIAGNOSIS — H349 Unspecified retinal vascular occlusion: Secondary | ICD-10-CM

## 2022-03-05 DIAGNOSIS — Z7901 Long term (current) use of anticoagulants: Secondary | ICD-10-CM

## 2022-03-05 DIAGNOSIS — I63412 Cerebral infarction due to embolism of left middle cerebral artery: Secondary | ICD-10-CM | POA: Diagnosis not present

## 2022-03-05 DIAGNOSIS — H4051X1 Glaucoma secondary to other eye disorders, right eye, mild stage: Secondary | ICD-10-CM | POA: Diagnosis not present

## 2022-03-05 DIAGNOSIS — Z8673 Personal history of transient ischemic attack (TIA), and cerebral infarction without residual deficits: Secondary | ICD-10-CM

## 2022-03-05 DIAGNOSIS — E119 Type 2 diabetes mellitus without complications: Secondary | ICD-10-CM | POA: Diagnosis not present

## 2022-03-05 LAB — POCT INR: INR: 1.2 — AB (ref 2.0–3.0)

## 2022-03-05 NOTE — Patient Instructions (Signed)
Patient instructed to take medications as defined in the Anti-coagulation Track section of this encounter.  Patient instructed to take today's dose.  Patient instructed to take one-and-one-half (1 & 1/2) tablets of your 5mg  peach-colored warfarin tablets on Mondays, Wednesdays and Fridays. Take only one (1) tablet on Sundays, Tuesdays, Thursdays and Saturdays.  Patient verbalized understanding of these instructions.

## 2022-03-05 NOTE — Progress Notes (Signed)
Anticoagulation Management Gregory May is a 61 y.o. male who reports to the clinic for monitoring of warfarin treatment.    Indication: CVA and retinal artery occlusion, History of; Recent repair/closure of PFO. He had only been on warfarin for five days before the repair of the PFO. Dose omitted on day of PFO and recommenced the following day, I.e. has only been back on uninterrupted warfarin for 4 days, accounting for today's value.   Duration: indefinite Supervising physician:  Mercie Eon, MD  Anticoagulation Clinic Visit History: Patient does not report signs/symptoms of bleeding or thromboembolism  Other recent changes: No diet, medications, lifestyle changes except as noted in patient findings.  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 DX:E11.65, Disp: 300 each, Rfl: 2   ACCU-CHEK GUIDE test strip, TEST BLOOD SUGAR THREE TIMES DAILY AS DIRECTED, Disp: 300 strip, Rfl: 3   acetaminophen (TYLENOL) 500 MG tablet, Take 500-1,000 mg by mouth at bedtime as needed for mild pain (pain)., Disp: , Rfl:    APPLE CIDER VINEGAR PO, Take 1 each by mouth daily. With Ginger, Disp: , Rfl:    aspirin EC 81 MG tablet, Take 1 tablet (81 mg total) by mouth daily. Swallow whole., Disp: 150 tablet, Rfl: 2   empagliflozin (JARDIANCE) 25 MG TABS tablet,  Take 1 tablet (25 mg total) by mouth daily., Disp: 90 tablet, Rfl: 3   fluticasone (FLONASE) 50 MCG/ACT nasal spray, Place 2 sprays into both nostrils at bedtime as needed (sinus drainage)., Disp: 9.9 mL, Rfl: 1   gabapentin (NEURONTIN) 300 MG capsule, TAKE 1 CAPSULE FOUR TIMES DAILY AS NEEDED (Patient taking differently: Take 600 mg by mouth 2 (two) times daily as needed (pain).), Disp: 360 capsule, Rfl: 0   insulin aspart (NOVOLOG) 100 UNIT/ML injection, Inject 16-20 units twice a day (Max daily dose 40 units) (Patient taking differently: Inject 16-20 Units into the skin 3 (three) times daily with meals. Sliding scale), Disp: 20 mL, Rfl: 0   insulin degludec (TRESIBA FLEXTOUCH) 100 UNIT/ML FlexTouch Pen, Inject up to 12 units at bedtime (Patient taking differently: Inject 6-10 Units into the skin at bedtime. Inject up to 12 units at bedtime), Disp: 30 mL, Rfl: 0   Insulin Syringes, Disposable, U-100 0.5 ML MISC, Use to inject insulin, Disp: 100 each, Rfl: 2   metFORMIN (GLUCOPHAGE-XR) 750 MG 24 hr tablet, TAKE 2 TABLETS EVERY DAY (Patient taking differently: Take 1,500 mg by mouth daily with breakfast.), Disp: 180 tablet, Rfl: 2   Multiple Vitamin (MULTIVITAMIN WITH MINERALS) TABS tablet, Take 1 tablet by mouth every morning. Centrum, Disp: , Rfl:    pioglitazone (ACTOS) 30 MG tablet, TAKE 1 TABLET EVERY DAY (Patient taking differently: Take 30 mg by mouth daily.), Disp: 90  tablet, Rfl: 1   rosuvastatin (CRESTOR) 20 MG tablet, Take 1 tablet (20 mg total) by mouth daily., Disp: 90 tablet, Rfl: 2   warfarin (COUMADIN) 5 MG tablet, Take 1 tablet (5 mg total) by mouth daily., Disp: 30 tablet, Rfl: 0 Past Medical History:  Diagnosis Date   Arthritis    bilateral hips   Cutaneous abscess of left foot    Deep vein thrombosis (DVT) (HCC)    Diabetes mellitus    type II   Diabetic ulcer of heel (HCC)    Right heel   DJD (degenerative joint disease)    DVT (deep venous thrombosis) (HCC) 02/08/2014    Proximal provoked. Date of diagnosis February 08 2014 Duration of anticoagulation: 6 months. End date 08/12/2014.  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 Duration of anticoagulation: 8 months. End date 10/11/2014.  Anticoagulant: Lovenox 120 units daily Switched to Eliquis on 05/25/2014 per patient preference    Pulmonary embolism (HCC)    Sebaceous cyst    on back of neck   Social History   Socioeconomic History   Marital status: Single    Spouse name: Not on file   Number of children: 0   Years of education: Not on file   Highest education level: Not on file  Occupational History   Occupation: disabled  Tobacco Use   Smoking status: Never   Smokeless tobacco: Never  Vaping Use   Vaping Use: Never used  Substance and Sexual Activity   Alcohol use: Not Currently    Comment: beer and mixed drink maybe 5  times a month   Drug use: No   Sexual activity: Never  Other Topics Concern   Not on file  Social History Narrative   Not on file   Social Determinants of Health   Financial Resource Strain: Low Risk  (12/15/2021)   Overall Financial Resource Strain (CARDIA)    Difficulty of Paying Living Expenses: Not hard at all  Food Insecurity: No Food Insecurity (12/15/2021)   Hunger Vital Sign    Worried About Running Out of Food in the Last Year: Never true    Ran Out of Food in the Last Year: Never true  Transportation Needs: Unmet Transportation Needs (12/15/2021)   PRAPARE - Transportation    Lack of Transportation (Medical): Yes    Lack of Transportation (Non-Medical): Yes  Physical Activity: Inactive (12/15/2021)   Exercise  Vital Sign    Days of Exercise per Week: 0 days    Minutes of Exercise per Session: 0 min  Stress: No Stress Concern Present (12/15/2021)   [employer] of Occupational Health - Occupational Stress Questionnaire    Feeling of Stress : Not at all  Social Connections: Socially Isolated (12/15/2021)   Social Connection and Isolation Panel [NHANES]    Frequency of Communication with Friends and Family: More than three times a week    Frequency of Social Gatherings with Friends and Family: More than three times a week    Attends Religious Services: Never    Database administrator or Organizations: No    Attends Club or  Organization Meetings: Never    Marital Status: Never married   Family History  Problem Relation Age of Onset   Breast cancer Mother    Cancer Mother        small intestine   Liver cancer Mother    Diabetes Mother    Diabetes Father    Diabetes Brother    Hypertension Maternal Grandmother    Heart Problems Maternal Grandmother    Diabetes Paternal Grandmother    Diabetes Paternal Grandfather    Diabetes Brother     ASSESSMENT Recent Results: The most recent result is correlated with 35 mg per week: Lab Results  Component Value Date   INR 1.2 (A) 03/05/2022   INR 1.0 02/28/2022   INR 1.4 (A) 02/26/2022    Anticoagulation Dosing: Description   Take one-and-one-half (1 & 1/2) tablets of your 5mg  peach-colored warfarin tablets on Mondays, Wednesdays and Fridays. Take only one (1) tablet on Sundays, Tuesdays, Thursdays and Saturdays.      INR today: Subtherapeutic  PLAN Weekly dose was increased by 20% to 42.5 mg per week  Patient Instructions  Patient instructed to take medications as defined in the Anti-coagulation Track section of this encounter.  Patient instructed to take today's dose.  Patient instructed to take one-and-one-half (1 & 1/2) tablets of your 5mg  peach-colored warfarin tablets on Mondays, Wednesdays and Fridays. Take only one (1) tablet on  Sundays, Tuesdays, Thursdays and Saturdays.  Patient verbalized understanding of these instructions.   Patient advised to contact clinic or seek medical attention if signs/symptoms of bleeding or thromboembolism occur.  Patient verbalized understanding by repeating back information and was advised to contact me if further medication-related questions arise. Patient was also provided an information handout.  Follow-up Return in 2 weeks (on 03/19/2022) for Follow up INR.  11-08-1988, PharmD, CPP  15 minutes spent face-to-face with the patient during the encounter. 50% of time spent on education, including signs/sx bleeding and clotting, as well as food and drug interactions with warfarin. 50% of time was spent on fingerprick POC INR sample collection,processing, results determination, and documentation in 05/19/2022.

## 2022-03-08 ENCOUNTER — Other Ambulatory Visit: Payer: Medicare HMO

## 2022-03-08 ENCOUNTER — Other Ambulatory Visit (INDEPENDENT_AMBULATORY_CARE_PROVIDER_SITE_OTHER): Payer: Medicare HMO

## 2022-03-08 DIAGNOSIS — Z794 Long term (current) use of insulin: Secondary | ICD-10-CM | POA: Diagnosis not present

## 2022-03-08 DIAGNOSIS — E1165 Type 2 diabetes mellitus with hyperglycemia: Secondary | ICD-10-CM | POA: Diagnosis not present

## 2022-03-08 DIAGNOSIS — H4051X1 Glaucoma secondary to other eye disorders, right eye, mild stage: Secondary | ICD-10-CM | POA: Diagnosis not present

## 2022-03-08 LAB — COMPREHENSIVE METABOLIC PANEL
ALT: 50 U/L (ref 0–53)
AST: 41 U/L — ABNORMAL HIGH (ref 0–37)
Albumin: 4.2 g/dL (ref 3.5–5.2)
Alkaline Phosphatase: 56 U/L (ref 39–117)
BUN: 14 mg/dL (ref 6–23)
CO2: 28 mEq/L (ref 19–32)
Calcium: 9.2 mg/dL (ref 8.4–10.5)
Chloride: 101 mEq/L (ref 96–112)
Creatinine, Ser: 0.79 mg/dL (ref 0.40–1.50)
GFR: 97.3 mL/min (ref >60.00)
Glucose, Bld: 104 mg/dL — ABNORMAL HIGH (ref 70–99)
Potassium: 4.1 mEq/L (ref 3.5–5.1)
Sodium: 138 mEq/L (ref 135–145)
Total Bilirubin: 0.3 mg/dL (ref 0.2–1.2)
Total Protein: 7.4 g/dL (ref 6.0–8.3)

## 2022-03-08 LAB — HEMOGLOBIN A1C: Hgb A1c MFr Bld: 7.2 % — ABNORMAL HIGH (ref 4.6–6.5)

## 2022-03-12 ENCOUNTER — Encounter: Payer: Self-pay | Admitting: Endocrinology

## 2022-03-12 ENCOUNTER — Ambulatory Visit (INDEPENDENT_AMBULATORY_CARE_PROVIDER_SITE_OTHER): Payer: Medicare HMO | Admitting: Endocrinology

## 2022-03-12 VITALS — BP 118/74 | HR 72 | Ht 72.0 in | Wt 195.0 lb

## 2022-03-12 DIAGNOSIS — E782 Mixed hyperlipidemia: Secondary | ICD-10-CM

## 2022-03-12 DIAGNOSIS — Z794 Long term (current) use of insulin: Secondary | ICD-10-CM

## 2022-03-12 DIAGNOSIS — E1165 Type 2 diabetes mellitus with hyperglycemia: Secondary | ICD-10-CM | POA: Diagnosis not present

## 2022-03-12 DIAGNOSIS — E1142 Type 2 diabetes mellitus with diabetic polyneuropathy: Secondary | ICD-10-CM

## 2022-03-12 NOTE — Patient Instructions (Signed)
Gregory May only if am sugar stays >130
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
## 2022-03-15 DIAGNOSIS — H4051X1 Glaucoma secondary to other eye disorders, right eye, mild stage: Secondary | ICD-10-CM | POA: Diagnosis not present

## 2022-03-19 ENCOUNTER — Ambulatory Visit (INDEPENDENT_AMBULATORY_CARE_PROVIDER_SITE_OTHER): Payer: Medicare HMO | Admitting: Pharmacist

## 2022-03-19 DIAGNOSIS — Z7901 Long term (current) use of anticoagulants: Secondary | ICD-10-CM | POA: Diagnosis not present

## 2022-03-19 DIAGNOSIS — I63412 Cerebral infarction due to embolism of left middle cerebral artery: Secondary | ICD-10-CM | POA: Diagnosis not present

## 2022-03-19 DIAGNOSIS — H3410 Central retinal artery occlusion, unspecified eye: Secondary | ICD-10-CM | POA: Diagnosis not present

## 2022-03-19 DIAGNOSIS — H3411 Central retinal artery occlusion, right eye: Secondary | ICD-10-CM | POA: Diagnosis not present

## 2022-03-19 DIAGNOSIS — H349 Unspecified retinal vascular occlusion: Secondary | ICD-10-CM | POA: Diagnosis not present

## 2022-03-19 DIAGNOSIS — Z8673 Personal history of transient ischemic attack (TIA), and cerebral infarction without residual deficits: Secondary | ICD-10-CM | POA: Diagnosis not present

## 2022-03-19 LAB — POCT INR: INR: 2 (ref 2.0–3.0)

## 2022-03-19 MED ORDER — WARFARIN SODIUM 5 MG PO TABS: 7.5000 mg | ORAL_TABLET | Freq: Every day | ORAL | 1 refills | Status: DC

## 2022-03-19 NOTE — Progress Notes (Signed)
Anticoagulation Management Gregory May is a 61 y.o. male who reports to the clinic for monitoring of warfarin treatment.    Indication:  History of CVA, central retinal artery occlusion RIGHT, R Middle Cerebral Artery stroke, Right retinal embolism, long term current use of oral anticoagulant warfarin, target INR 2.0 - 3.0   Duration: indefinite Supervising physician: Nischal Narendra  Anticoagulation Clinic Visit History: Patient does not report signs/symptoms of bleeding or thromboembolism  Other recent changes: No diet, medications, lifestyle changes. Anticoagulation Episode Summary     Current INR goal:  2.0-3.0  TTR:  0.0 % (1.6 wk)  Next INR check:  04/02/2022  INR from last check:  2.0 (03/19/2022)  Weekly max warfarin dose:    Target end date:    INR check location:    Preferred lab:    Send INR reminders to:     Indications   Pulmonary emboli (HCC) (Resolved) [I26.99] DVT (deep venous thrombosis) (HCC) (Resolved) [I82.409] Retinal artery occlusion central [H34.10] Right retinal embolus [H34.9] Long term (current) use of anticoagulants [Z79.01]        Comments:           No Known Allergies  Current Outpatient Medications:    Accu-Chek FastClix Lancets MISC, Use Accu Chek Fastclix lancets to check blood sugar three times daily. DX:E11.65, Disp: 300 each, Rfl: 2   ACCU-CHEK GUIDE test strip, TEST BLOOD SUGAR THREE TIMES DAILY AS DIRECTED, Disp: 300 strip, Rfl: 3   acetaminophen (TYLENOL) 500 MG tablet, Take 500-1,000 mg by mouth at bedtime as needed for mild pain (pain)., Disp: , Rfl:    APPLE CIDER VINEGAR PO, Take 1 each by mouth daily. With Ginger, Disp: , Rfl:    aspirin EC 81 MG tablet, Take 1 tablet (81 mg total) by mouth daily. Swallow whole., Disp: 150 tablet, Rfl: 2   empagliflozin (JARDIANCE) 25 MG TABS tablet, Take 1 tablet (25 mg total) by mouth daily., Disp: 90 tablet, Rfl: 3   fluticasone (FLONASE) 50 MCG/ACT nasal spray, Place 2 sprays into  both nostrils at bedtime as needed (sinus drainage)., Disp: 9.9 mL, Rfl: 1   gabapentin (NEURONTIN) 300 MG capsule, TAKE 1 CAPSULE FOUR TIMES DAILY AS NEEDED (Patient taking differently: Take 600 mg by mouth 2 (two) times daily as needed (pain).), Disp: 360 capsule, Rfl: 0   insulin aspart (NOVOLOG) 100 UNIT/ML injection, Inject 16-20 units twice a day (Max daily dose 40 units) (Patient taking differently: Inject 16-20 Units into the skin 3 (three) times daily with meals. Sliding scale), Disp: 20 mL, Rfl: 0   insulin degludec (TRESIBA FLEXTOUCH) 100 UNIT/ML FlexTouch Pen, Inject up to 12 units at bedtime, Disp: 30 mL, Rfl: 0   Insulin Syringes, Disposable, U-100 0.5 ML MISC, Use to inject insulin, Disp: 100 each, Rfl: 2   metFORMIN (GLUCOPHAGE-XR) 750 MG 24 hr tablet, TAKE 2 TABLETS EVERY DAY (Patient taking differently: Take 1,500 mg by mouth daily with breakfast.), Disp: 180 tablet, Rfl: 2   Multiple Vitamin (MULTIVITAMIN WITH MINERALS) TABS tablet, Take 1 tablet by mouth every morning. Centrum, Disp: , Rfl:    pioglitazone (ACTOS) 30 MG tablet, TAKE 1 TABLET EVERY DAY (Patient taking differently: Take 30 mg by mouth daily.), Disp: 90 tablet, Rfl: 1   rosuvastatin (CRESTOR) 20 MG tablet, Take 1 tablet (20 mg total) by mouth daily., Disp: 90 tablet, Rfl: 2   warfarin (COUMADIN) 5 MG tablet, Take 1.5 tablets (7.5 mg total) by mouth daily., Disp: 48 tablet, Rfl: 1 Past  Medical History:  Diagnosis Date   Arthritis    bilateral hips   Cutaneous abscess of left foot    Deep vein thrombosis (DVT) (HCC)    Diabetes mellitus    type II   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 Date of diagnosis February 08 2014 Duration of anticoagulation: 6 months. End date 08/12/2014.  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 Duration of anticoagulation: 8 months. End date 10/11/2014.  Anticoagulant: Lovenox 120 units daily Switched to Eliquis on 05/25/2014 per patient preference    Pulmonary embolism (HCC)    Sebaceous cyst    on back of neck   Social History   Socioeconomic History   Marital status: Single    Spouse name: Not on file   Number of children: 0   Years of education: Not on file   Highest education level: Not on file  Occupational History   Occupation: disabled  Tobacco Use   Smoking status: Never   Smokeless tobacco: Never  Vaping Use   Vaping Use: Never used  Substance and Sexual Activity   Alcohol use: Not Currently    Comment: beer and mixed drink maybe 5  times a month   Drug use: No   Sexual activity: Never  Other Topics Concern   Not on file  Social History Narrative   Not on file   Social Determinants of Health   Financial Resource Strain: Low Risk  (12/15/2021)   Overall Financial Resource Strain (CARDIA)    Difficulty of Paying Living Expenses: Not hard at all  Food Insecurity: No Food Insecurity (12/15/2021)   Hunger Vital Sign    Worried About Running Out of Food in the Last Year: Never true    Ran Out of Food in the Last Year: Never true  Transportation Needs: Unmet Transportation Needs (12/15/2021)   PRAPARE - Transportation    Lack of Transportation (Medical): Yes    Lack of Transportation (Non-Medical): Yes  Physical Activity: Inactive (12/15/2021)   Exercise Vital Sign    Days of Exercise per Week: 0 days    Minutes of Exercise per Session: 0 min  Stress: No Stress Concern Present (12/15/2021)   [employer] of Occupational Health - Occupational Stress Questionnaire    Feeling of  Stress : Not at all  Social Connections: Socially Isolated (12/15/2021)   Social Connection and Isolation Panel [NHANES]    Frequency of Communication with Friends and Family: More than three times a week    Frequency of Social Gatherings with Friends and Family: More than three times a week    Attends Religious Services: Never    Database administrator or Organizations: No    Attends Banker Meetings: Never    Marital Status: Never married   Family History  Problem Relation Age of Onset   Breast cancer Mother    Cancer Mother        small intestine   Liver cancer Mother  Diabetes Mother    Diabetes Father    Diabetes Brother    Hypertension Maternal Grandmother    Heart Problems Maternal Grandmother    Diabetes Paternal Grandmother    Diabetes Paternal Grandfather    Diabetes Brother     ASSESSMENT Recent Results: The most recent result is correlated with 42.5 mg per week: Lab Results  Component Value Date   INR 2.0 03/19/2022   INR 1.2 (A) 03/05/2022   INR 1.0 02/28/2022    Anticoagulation Dosing: Description   Take one-and-one-half (1 & 1/2) tablets of your 5mg  peach-colored warfarin tablets by mouth, once-daily.      INR today: Therapeutic  PLAN Weekly dose was increased by  23 % to 52.5 mg per week  Patient Instructions  Patient instructed to take medications as defined in the Anti-coagulation Track section of this encounter.  Patient instructed to take today's dose.  Patient instructed to take one-and-one-half (1 & 1/2) tablets of your 5mg  peach-colored warfarin tablets by mouth, once-daily. Patient verbalized understanding of these instructions.   Patient advised to contact clinic or seek medical attention if signs/symptoms of bleeding or thromboembolism occur.  Patient verbalized understanding by repeating back information and was advised to contact me if further medication-related questions arise. Patient was also provided an information  handout.  Follow-up Return in 2 weeks (on 04/02/2022) for Follow up INR.  , PharmD, CPP  15 minutes spent face-to-face with the patient during the encounter. 50% of time spent on education, including signs/sx bleeding and clotting, as well as food and drug interactions with warfarin. 50% of time was spent on fingerprick POC INR sample collection,processing, results determination, and documentation in 04/04/2022.

## 2022-03-19 NOTE — Patient Instructions (Signed)
Patient instructed to take medications as defined in the Anti-coagulation Track section of this encounter.  Patient instructed to take today's dose.  Patient instructed to take one-and-one-half (1 & 1/2) tablets of your 5mg  peach-colored warfarin tablets by mouth, once-daily. Patient verbalized understanding of these instructions.

## 2022-03-21 ENCOUNTER — Telehealth: Payer: Self-pay

## 2022-03-21 NOTE — Progress Notes (Addendum)
INTERNAL MEDICINE TEACHING ATTENDING ADDENDUM - Earl Lagos M.D  Duration- indefinite, Indication- central retinal artery occlusion, INR- therapeutic. Agree with pharmacy recommendations as outlined in their note.

## 2022-03-21 NOTE — Telephone Encounter (Signed)
Called and informed patient that patient assistance for Novolog vials (5 boxes) and Tresiba(1 box) is ready for pickup.

## 2022-03-27 NOTE — Progress Notes (Unsigned)
[facility]   MULTIDISCIPLINARY HEART VALVE CLINIC                                     Cardiology Office Note:    Date:  03/28/2022   ID:  Gregory May, DOB June 12, 1961, MRN [id number]  PCP:  Gwenevere Abbot, MD  [facility] Cardiologist:  None  [facility] Electrophysiologist:  None   Referring MD: [name], MD   Chief Complaint  Patient presents with   Follow-up    1 month PFO    History of Present Illness:    Gregory May is a 61 y.o. male with a hx of DVT/PE in 2015 treated with 8 months of OAC, uncontrolled DMT2, PAD s/p R BKA, HTN, HLD, and recent CVA with diagnosis of a chronic DVT and PFO with atrial septal aneurysm who presents to clinic for follow up prior to PFO closure.    The patient was hospitalized 11/2021 with sudden loss of vision in the right eye. 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 CTA study showed no evidence of carotid occlusive disease. The patient was noted to have aortic atherosclerosis involving the aortic arch. The patient's hypercoagulable panel was negative and notably absent for lupus anticoagulant, factor V Leiden mutation, and prothrombin gene mutation.  Protein C and protein S were normal. Echo with bubble showed a PFO with an atrial septal aneurysm. The patient was also diagnosed with chronic lower extremity DVT during the same hospitalization. He has followed up with neurology and insurance did not approve the suggested 30 day cardiac monitoring.    He was seen in the office by Dr Excell Seltzer on 12/02/21 for consideration of PFO closure. While his ROPE Score was notably low at 4, suggesting low probability of PFO-related stroke, the clinical scenario with suggestion of cardioembolic event, diagnosis of chronic DVT, and PFO with atrial septal aneurysm is concerning for the possibility of a PFO-related stroke. It was recommended that he continue  on Eliquis and proceed with TEE to further define his PFO anatomy, quantitate the size of PFO and shunt, and confirm the presence of atrial septal aneurysm.    TEE performed 01/24/22 which showed large mobile atrial septal aneurysm with large PFO at inferior/anterior margin with ling tunnel gap as large as 0.75 cm.   He underwent PFO closure with 25 mm Abbott Talacen PFO occluder device with Dr. Excell Seltzer on 02/28/22. He was continued on Coumadin and ASA was added to his regimen which he will stop after 3 months (10/19). He does not go to the dentist therefore no need for SBE at this time. He biggest concern today is an acute eye pain for which he saw his opthamologist earlier today who recommended that he go to [facility] or [facility] ED for evaluation as he will not be able to get into a retial specialist very soon here in [place]. He reports occular pressures are elevated in the affected eye. He denies chest pain, palpitations, LE edema, orthopnea, dizziness, or syncope.    Past Medical History:  Diagnosis Date   Arthritis    bilateral hips   Cutaneous abscess of left foot    Deep vein thrombosis (DVT) (HCC)    Diabetes mellitus    type II   Diabetic ulcer of heel (HCC)    Right heel  DJD (degenerative joint disease)    DVT (deep venous thrombosis) (HCC) 02/08/2014   Proximal provoked. Date of diagnosis February 08 2014 Duration of anticoagulation: 6 months. End date 08/12/2014.  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 Duration of anticoagulation: 8 months. End date 10/11/2014.  Anticoagulant: Lovenox 120 units daily Switched to Eliquis on 05/25/2014  per patient preference    Pulmonary embolism (HCC)    Sebaceous cyst    on back of neck    Past Surgical History:  Procedure Laterality Date   AMPUTATION Right 06/03/2015   Procedure: Right Below Knee Amputation;  Surgeon: Nadara Mustard, MD;  Location: [facility] OR;  Service: Orthopedics;  Laterality: Right;   AMPUTATION Left 07/24/2019   Procedure: LEFT FOOT 3RD RAY AMPUTATION;  Surgeon: Nadara Mustard, MD;  Location: [facility] OR;  Service: Orthopedics;  Laterality: Left;   AMPUTATION Left 06/23/2021   Procedure: LEFT 2ND TOE AMPUTATION;  Surgeon: Nadara Mustard, MD;  Location: [facility] OR;  Service: Orthopedics;  Laterality: Left;   AMPUTATION Right 08/21/2021   Procedure: AMPUTATION RIGHT INDEX FINGER;  Surgeon: Gomez Cleverly, MD;  Location: [facility] OR;  Service: Orthopedics;  Laterality: Right;   BUBBLE STUDY  01/24/2022   Procedure: BUBBLE STUDY;  Surgeon: Wendall Stade, MD;  Location: [facility] ENDOSCOPY;  Service: Cardiovascular;;   CLOSED REDUCTION WITH HUMER PIN INSERTION  1974   left hip   HARDWARE REMOVAL Left 07/21/2014   Procedure: HARDWARE REMOVAL;  Surgeon: Loreta Ave, MD;  Location: [facility] OR;  Service: Orthopedics;  Laterality: Left;   I & D EXTREMITY Right 08/21/2021   Procedure: IRRIGATION AND DEBRIDEMENT RIGHT INDEX FINGER;  Surgeon: Gomez Cleverly, MD;  Location: [facility] OR;  Service: Orthopedics;  Laterality: Right;   PATENT FORAMEN OVALE(PFO) CLOSURE N/A 02/28/2022   Procedure: PATENT FORAMEN OVALE(PFO) CLOSURE;  Surgeon: Tonny Bollman, MD;  Location: [facility] INVASIVE CV LAB;  Service: Cardiovascular;  Laterality: N/A;   ROTATOR CUFF REPAIR Right 2005 (approx)   TEE WITHOUT CARDIOVERSION N/A 01/24/2022   Procedure: TRANSESOPHAGEAL ECHOCARDIOGRAM (TEE);  Surgeon: Wendall Stade, MD;  Location: [facility] ENDOSCOPY;  Service: Cardiovascular;  Laterality: N/A;   TOTAL HIP ARTHROPLASTY Left 07/21/2014   Procedure: TOTAL HIP ARTHROPLASTY ANTERIOR APPROACH;  Surgeon: Loreta Ave, MD;  Location: [facility] OR;  Service:  Orthopedics;  Laterality: Left;   TOTAL HIP ARTHROPLASTY Right 2006 (approx)   right hip replaced    Current Medications: Current Meds  Medication Sig   Accu-Chek FastClix Lancets MISC Use Accu Chek Fastclix lancets to check blood sugar three times daily. DX:E11.65   ACCU-CHEK GUIDE test strip TEST BLOOD SUGAR THREE TIMES DAILY AS DIRECTED   acetaminophen (TYLENOL) 500 MG tablet Take 500-1,000 mg by mouth at bedtime as needed for mild pain (pain).   APPLE CIDER VINEGAR PO Take 1 each by mouth daily. With Ginger   aspirin EC 81 MG tablet Take 1 tablet (81 mg total) by mouth daily. Swallow whole.   empagliflozin (JARDIANCE) 25 MG TABS tablet Take 1 tablet (25 mg total) by mouth daily.   fluticasone (FLONASE) 50 MCG/ACT nasal spray Place  2 sprays into both nostrils at bedtime as needed (sinus drainage).   gabapentin (NEURONTIN) 300 MG capsule TAKE 1 CAPSULE FOUR TIMES DAILY AS NEEDED (Patient taking differently: Take 600 mg by mouth 2 (two) times daily as needed (pain).)   insulin aspart (NOVOLOG) 100 UNIT/ML injection Inject 16-20 units twice a day (Max daily dose 40 units) (Patient taking differently: Inject 16-20 Units into the skin 3 (three) times daily with meals. Sliding scale)   insulin degludec (TRESIBA FLEXTOUCH) 100 UNIT/ML FlexTouch Pen Inject up to 12 units at bedtime   Insulin Syringes, Disposable, U-100 0.5 ML MISC Use to inject insulin   metFORMIN (GLUCOPHAGE-XR) 750 MG 24 hr tablet TAKE 2 TABLETS EVERY DAY   Multiple Vitamin (MULTIVITAMIN WITH MINERALS) TABS tablet Take 1 tablet by mouth every morning. Centrum   pioglitazone (ACTOS) 30 MG tablet TAKE 1 TABLET EVERY DAY   rosuvastatin (CRESTOR) 20 MG tablet Take 1 tablet (20 mg total) by mouth daily.   warfarin (COUMADIN) 5 MG tablet Take 1.5 tablets (7.5 mg total) by mouth daily.     Allergies:   Patient has no known allergies.   Social History   Socioeconomic History   Marital status: Single    Spouse name: Not on file    Number of children: 0   Years of education: Not on file   Highest education level: Not on file  Occupational History   Occupation: disabled  Tobacco Use   Smoking status: Never   Smokeless tobacco: Never  Vaping Use   Vaping Use: Never used  Substance and Sexual Activity   Alcohol use: Not Currently    Comment: beer and mixed drink maybe 5  times a month   Drug use: No   Sexual activity: Never  Other Topics Concern   Not on file  Social History Narrative   Not on file   Social Determinants of Health   Financial Resource Strain: Low Risk  (12/15/2021)   Overall Financial Resource Strain (CARDIA)    Difficulty of Paying Living Expenses: Not hard at all  Food Insecurity: No Food Insecurity (12/15/2021)   Hunger Vital Sign    Worried About Running Out of Food in the Last Year: Never true    Ran Out of Food in the Last Year: Never true  Transportation Needs: Unmet Transportation Needs (12/15/2021)   PRAPARE - Transportation    Lack of Transportation (Medical): Yes    Lack of Transportation (Non-Medical): Yes  Physical Activity: Inactive (12/15/2021)   Exercise Vital Sign    Days of Exercise per Week: 0 days    Minutes of Exercise per Session: 0 min  Stress: No Stress Concern Present (12/15/2021)   [employer] - Occupational Stress Questionnaire    Feeling of Stress : Not at all  Social Connections: Socially Isolated (12/15/2021)   Social Connection and Isolation Panel [NHANES]    Frequency of Communication with Friends and Family: More than three times a week    Frequency of Social Gatherings with Friends and Family: More than three times a week    Attends Religious Services: Never    Database administratorActive Member of Clubs or Organizations: No    Attends Engineer, structuralClub or Organization Meetings: Never    Marital Status: Never married     Family History: The patient's family history includes Breast cancer in his mother; Cancer in his mother; Diabetes in his brother, brother,  father, mother, paternal grandfather, and paternal grandmother; Heart Problems in his maternal grandmother; Hypertension  in his maternal grandmother; Liver cancer in his mother.  ROS:   Please see the history of present illness.    All other systems reviewed and are negative.  EKGs/Labs/Other Studies Reviewed:    The following studies were reviewed today:  TEE 01/24/22:   1. Large mobile atrial septal aneursym with large PFO at  inferior/anterior margin with ling tunnel Gap as large as 0.75 cm.   2. Patient has been referred to Dr Excell Seltzer to consider percutaneous  closure in setting of multiple previous TIA;s.   3. Left ventricular ejection fraction, by estimation, is 55 to 60%. The  left ventricle has normal function.   4. Right ventricular systolic function is normal. The right ventricular  size is normal.   5. Left atrial size was mildly dilated. No left atrial/left atrial  appendage thrombus was detected.   6. The mitral valve is abnormal. Mild mitral valve regurgitation.   7. The aortic valve is tricuspid. There is mild calcification of the  aortic valve. Aortic valve regurgitation is not visualized. Aortic valve  sclerosis is present, with no evidence of aortic valve stenosis.   8. Evidence of atrial level shunting detected by color flow Doppler.  Agitated saline contrast bubble study was positive with shunting observed  within 3-6 cardiac cycles suggestive of interatrial shunt.    Echo with bubble 11/16/21 IMPRESSIONS   1. Left ventricular ejection fraction, by estimation, is 60 to 65%. The  left ventricle has normal function. The left ventricle has no regional  wall motion abnormalities. Left ventricular diastolic parameters were  normal.   2. Right ventricular systolic function is normal. The right ventricular  size is normal. Tricuspid regurgitation signal is inadequate for assessing  PA pressure.   3. The mitral valve is abnormal. Trivial mitral valve regurgitation.    4. The aortic valve is tricuspid. Aortic valve regurgitation is not  visualized. Aortic valve sclerosis/calcification is present, without any  evidence of aortic stenosis. Aortic valve mean gradient measures 6.0 mmHg.   5. Aortic dilatation noted. There is borderline dilatation of the aortic  root, measuring 38 mm.   6. The inferior vena cava is normal in size with greater than 50%  respiratory variability, suggesting right atrial pressure of 3 mmHg.   7. Aneurysmal interatrial septum. Evidence of atrial level shunting  detected by color flow Doppler. Agitated saline contrast bubble study was  positive with shunting observed within 3-6 cardiac cycles suggestive of  interatrial shunt. There is a  moderately sized patent foramen ovale with predominantly right to left  shunting across the atrial septum.   Comparison(s): No prior Echocardiogram.   Conclusion(s)/Recommendation(s): Recommend structural heart team  evaluation for possible PFO closure.  _______________________     MRI Brain 11/16/21: IMPRESSION: Multiple small 3-5 mm areas of acute infarct in the right MCA territory. Findings suggests emboli. Review of the CTA reveals no source in the aortic arch or right carotid.   Atrophy and chronic ischemic changes.   _______________________   CTA Chest 11/15/21: AORTIC ARCH:   There is calcific atherosclerosis of the aortic arch. There is no aneurysm, dissection or hemodynamically significant stenosis of the visualized portion of the aorta. Conventional 3 vessel aortic branching pattern. The visualized proximal subclavian arteries are widely patent.   RIGHT CAROTID SYSTEM: No dissection, occlusion or aneurysm. Mild atherosclerotic calcification at the carotid bifurcation without hemodynamically significant stenosis.   LEFT CAROTID SYSTEM: No dissection, occlusion or aneurysm. Mild atherosclerotic calcification at the carotid bifurcation without hemodynamically  significant  stenosis.   VERTEBRAL ARTERIES: Left dominant configuration. Both origins are clearly patent. There is no dissection, occlusion or flow-limiting stenosis to the skull base (V1-V3 segments).   CTA HEAD FINDINGS   POSTERIOR CIRCULATION:   --Vertebral arteries: Normal V4 segments.   --Inferior cerebellar arteries: Normal.   --Basilar artery: Normal.   --Superior cerebellar arteries: Normal.   --Posterior cerebral arteries (PCA): Normal.   ANTERIOR CIRCULATION:   --Intracranial internal carotid arteries: Normal.   --Anterior cerebral arteries (ACA): Normal. Both A1 segments are present. Patent anterior communicating artery (a-comm).   --Middle cerebral arteries (MCA): Normal.   VENOUS SINUSES: As permitted by contrast timing, patent.   ANATOMIC VARIANTS: None   Review of the MIP images confirms the above findings.   IMPRESSION: 1. No emergent large vessel occlusion or high-grade stenosis of the intracranial or cervical arteries.   Aortic Atherosclerosis (ICD10-I70.0).  EKG:  EKG is not ordered today.    Recent Labs: 11/15/2021: Magnesium 1.9 02/19/2022: Hemoglobin 15.8; Platelets 291 03/08/2022: ALT 50; BUN 14; Creatinine, Ser 0.79; Potassium 4.1; Sodium 138  Recent Lipid Panel    Component Value Date/Time   CHOL 136 11/29/2021 1503   TRIG 79.0 11/29/2021 1503   HDL 53.80 11/29/2021 1503   CHOLHDL 3 11/29/2021 1503   VLDL 15.8 11/29/2021 1503   LDLCALC 67 11/29/2021 1503   LDLDIRECT 91.0 07/11/2015 1329   Physical Exam:    VS:  BP 120/80   Pulse 62   Ht 6' (1.829 m)   Wt 191 lb (86.6 kg)   SpO2 97%   BMI 25.90 kg/m     Wt Readings from Last 3 Encounters:  03/28/22 191 lb (86.6 kg)  03/12/22 195 lb (88.5 kg)  02/28/22 206 lb (93.4 kg)    General: Well developed, well nourished, NAD Neck: Negative for carotid bruits. No JVD Lungs:Clear to ausculation bilaterally. Breathing is unlabored. Cardiovascular: RRR with S1 S2. No murmurs Extremities: No  edema.  Neuro: Alert and oriented. No focal deficits. No facial asymmetry. MAE spontaneously. Psych: Responds to questions appropriately with normal affect.    ASSESSMENT/PLAN:    Hx of CVA with known PFO with atrial septal aneurysm: TEE 6/14 with Dr. Eden Emms showed large mobile atrial septal aneursym with large PFO at inferior/anterior margin with ling tunnel gap as large as 0.75 cm. He is now s/p PFO closure with 25mm Abbott Talacen occluder device without complication. He was resumed on warfarin the with addition on ASA  QD for three months. He will stop this 05/31/22. This was reviewed with the patient and placed on AVS. NO need for SBE as he does not follow regularly with a dentist. Plan 1 year follow with the limited echo with bubble. This has been scheduled.     HTN: Stable with no changes needed today.    Chronic DVT: Continue warfarin. ASA added to regimen given PFO closure. He will stop ASA 05/31/22.   Retial/eye pain: Patient with very irritated right eye. Seen by opthamologist earlier today with recommendations to got to [facility] or [facility] ED for retinal specialist.     Medication Adjustments/Labs and Tests Ordered: Current medicines are reviewed at length with the patient today.  Concerns regarding medicines are outlined above.  No orders of the defined types were placed in this encounter.  No orders of the defined types were placed in this encounter.   Patient Instructions  Medication Instructions:  Your physician has recommended you make the following change in your medication:  STOP ASPIRIN ON 05/31/22   *If you need a refill on your cardiac medications before your next appointment, please call your pharmacy*   Lab Work: NONE If you have labs (blood work) drawn today and your tests are completely normal, you will receive your results only by: MyChart Message (if you have MyChart) OR A paper copy in the mail If you have any lab test that is abnormal or we need to change  your treatment, we will call you to review the results.   Testing/Procedures: NONE   Follow-Up: At [facility], you and your health needs are our priority.  As part of our continuing mission to provide you with exceptional heart care, we have created designated Provider Care Teams.  These Care Teams include your primary Cardiologist (physician) and Advanced Practice Providers (APPs -  Physician Assistants and Nurse Practitioners) who all work together to provide you with the care you need, when you need it.  We recommend signing up for the patient portal called "MyChart".  Sign up information is provided on this After Visit Summary.  MyChart is used to connect with patients for Virtual Visits (Telemedicine).  Patients are able to view lab/test results, encounter notes, upcoming appointments, etc.  Non-urgent messages can be sent to your provider as well.   To learn more about what you can do with MyChart, go to [email or web address].    Your next appointment:   KEEP SCHEDULED FOLLOW-UP  Important Information About Sugar         Raliegh Ip, NP  03/28/2022 12:34 PM    [facility]

## 2022-03-28 ENCOUNTER — Other Ambulatory Visit: Payer: Self-pay | Admitting: Cardiology

## 2022-03-28 ENCOUNTER — Ambulatory Visit (INDEPENDENT_AMBULATORY_CARE_PROVIDER_SITE_OTHER): Payer: Medicare HMO | Admitting: Cardiology

## 2022-03-28 VITALS — BP 120/80 | HR 62 | Ht 72.0 in | Wt 191.0 lb

## 2022-03-28 DIAGNOSIS — Q2112 Patent foramen ovale: Secondary | ICD-10-CM | POA: Diagnosis not present

## 2022-03-28 DIAGNOSIS — I10 Essential (primary) hypertension: Secondary | ICD-10-CM

## 2022-03-28 DIAGNOSIS — I825Y9 Chronic embolism and thrombosis of unspecified deep veins of unspecified proximal lower extremity: Secondary | ICD-10-CM | POA: Diagnosis not present

## 2022-03-28 DIAGNOSIS — H4051X1 Glaucoma secondary to other eye disorders, right eye, mild stage: Secondary | ICD-10-CM | POA: Diagnosis not present

## 2022-03-28 DIAGNOSIS — I253 Aneurysm of heart: Secondary | ICD-10-CM

## 2022-03-28 DIAGNOSIS — Z7901 Long term (current) use of anticoagulants: Secondary | ICD-10-CM | POA: Diagnosis not present

## 2022-03-28 NOTE — Patient Instructions (Signed)
Medication Instructions:  Your physician has recommended you make the following change in your medication:  STOP ASPIRIN ON 05/31/22   *If you need a refill on your cardiac medications before your next appointment, please call your pharmacy*   Lab Work: NONE If you have labs (blood work) drawn today and your tests are completely normal, you will receive your results only by: MyChart Message (if you have MyChart) OR A paper copy in the mail If you have any lab test that is abnormal or we need to change your treatment, we will call you to review the results.   Testing/Procedures: NONE   Follow-Up: At [facility], you and your health needs are our priority.  As part of our continuing mission to provide you with exceptional heart care, we have created designated Provider Care Teams.  These Care Teams include your primary Cardiologist (physician) and Advanced Practice Providers (APPs -  Physician Assistants and Nurse Practitioners) who all work together to provide you with the care you need, when you need it.  We recommend signing up for the patient portal called "MyChart".  Sign up information is provided on this After Visit Summary.  MyChart is used to connect with patients for Virtual Visits (Telemedicine).  Patients are able to view lab/test results, encounter notes, upcoming appointments, etc.  Non-urgent messages can be sent to your provider as well.   To learn more about what you can do with MyChart, go to [email or web address].    Your next appointment:   KEEP SCHEDULED FOLLOW-UP  Important Information About Sugar

## 2022-03-29 ENCOUNTER — Other Ambulatory Visit: Payer: Self-pay

## 2022-03-29 NOTE — Patient Outreach (Signed)
[facility]) Care Management  03/29/2022  Gregory May Mar 13, 1961 [id number]   Telephone call to patient for nurse call. Patient having problems with his eyes/ To see eye doctor on next week.  Discussed [facility] services and nurse outreach.  Patient agreeable to nurse outreach calls.   Care Plan : RN CM Plan of Care  Updates made by Fleeta Emmer, RN since 03/29/2022 12:00 AM     Problem: Chronic Disease Management and Care Coordination Needs [place]   Priority: High     Long-Range Goal: Development of Plan of Care for Management of   Start Date: 03/29/2022  Expected End Date: 08/12/2022  Priority: High  Note:   Current Barriers:  Chronic Disease Management support and education needs related to DMII   RNCM Clinical Goal(s):  Patient will verbalize basic understanding of  DMII disease process and self health management plan as evidenced by A1c less than 7.0  through collaboration with RN Care manager, provider, and care team.   Interventions: Education and support related to diabetes Inter-disciplinary care team collaboration (see longitudinal plan of care) Evaluation of current treatment plan related to  self management and patient's adherence to plan as established by provider   Diabetes Interventions:  (Status:  New goal.) Long Term Goal Assessed patient's understanding of A1c goal: <7% Provided education to patient about basic DM disease process Reviewed medications with patient and discussed importance of medication adherence Lab Results  Component Value Date   HGBA1C 7.2 (H) 03/08/2022   Patient Goals/Self-Care Activities: Take all medications as prescribed Attend all scheduled provider appointments keep appointment with eye doctor check blood sugar at prescribed times: three times daily check feet daily for cuts, sores or redness enter blood sugar readings and medication or insulin into daily log take the blood sugar log to all doctor  visits manage portion size  Follow Up Plan:  Telephone follow up appointment with care management team member scheduled for:  October The patient has been provided with contact information for the care management team and has been advised to call with any health related questions or concerns.      Plan: RN CM will provide ongoing education and support to patient through phone calls.   RN CM will send welcome packet with consent to patient.   RN CM will send initial barriers letter, assessment, and care plan to primary care physician.   RN CM will contact patient in October and patient agrees to next contact and care plan.  Bary Leriche, RN, MSN [facility] Care Management Care Management Coordinator Direct Line [phone] Toll Free: [phone]  Fax: [phone]

## 2022-03-29 NOTE — Patient Instructions (Signed)
Patient Goals/Self-Care Activities: Take all medications as prescribed Attend all scheduled provider appointments keep appointment with eye doctor check blood sugar at prescribed times: three times daily check feet daily for cuts, sores or redness enter blood sugar readings and medication or insulin into daily log take the blood sugar log to all doctor visits manage portion size

## 2022-04-02 ENCOUNTER — Ambulatory Visit (INDEPENDENT_AMBULATORY_CARE_PROVIDER_SITE_OTHER): Payer: Medicare HMO | Admitting: Pharmacist

## 2022-04-02 DIAGNOSIS — H3411 Central retinal artery occlusion, right eye: Secondary | ICD-10-CM | POA: Diagnosis not present

## 2022-04-02 DIAGNOSIS — Z7901 Long term (current) use of anticoagulants: Secondary | ICD-10-CM

## 2022-04-02 DIAGNOSIS — H349 Unspecified retinal vascular occlusion: Secondary | ICD-10-CM | POA: Diagnosis not present

## 2022-04-02 LAB — POCT INR: INR: 2.1 (ref 2.0–3.0)

## 2022-04-02 MED ORDER — WARFARIN SODIUM 5 MG PO TABS: ORAL_TABLET | ORAL | 1 refills | Status: DC

## 2022-04-02 NOTE — Patient Instructions (Signed)
Patient instructed to take medications as defined in the Anti-coagulation Track section of this encounter.  Patient instructed to take today's dose.  Patient instructed to take two (2) of your 5mg  strength, peach-colored warfarin tablets on Sundays, Mondays, Wednesdays and Fridays; On  Tuesdays, Thursdays and Saturdays, take only one-and-one-half (1 & 1/2) tablets of your warfarin.  Patient verbalized understanding of these instructions.

## 2022-04-02 NOTE — Progress Notes (Signed)
Anticoagulation Management Gregory May is a 61 y.o. male who reports to the clinic for monitoring of warfarin treatment.    Indication:  Retinal arterial occlusion, central; Right retinal embolus; Long term current use of oral anticoagulant, warfarin. Target INR 2.0 - 3.0.   Duration: indefinite Supervising physician:  Tera Mater, MD  Anticoagulation Clinic Visit History: Patient does not report signs/symptoms of bleeding or thromboembolism  Other recent changes: No diet, medications, lifestyle changes.  Anticoagulation Episode Summary     Current INR goal:  2.0-3.0  TTR:  55.2 % (3.6 wk)  Next INR check:  04/30/2022  INR from last check:  2.1 (04/02/2022)  Weekly max warfarin dose:    Target end date:    INR check location:    Preferred lab:    Send INR reminders to:     Indications   Pulmonary emboli (HCC) (Resolved) [I26.99] DVT (deep venous thrombosis) (HCC) (Resolved) [I82.409] Retinal artery occlusion central [H34.10] Right retinal embolus [H34.9] Long term (current) use of anticoagulants [Z79.01]        Comments:           No Known Allergies  Current Outpatient Medications:    Accu-Chek FastClix Lancets MISC, Use Accu Chek Fastclix lancets to check blood sugar three times daily. DX:E11.65, Disp: 300 each, Rfl: 2   ACCU-CHEK GUIDE test strip, TEST BLOOD SUGAR THREE TIMES DAILY AS DIRECTED, Disp: 300 strip, Rfl: 3   acetaminophen (TYLENOL) 500 MG tablet, Take 500-1,000 mg by mouth at bedtime as needed for mild pain (pain)., Disp: , Rfl:    APPLE CIDER VINEGAR PO, Take 1 each by mouth daily. With Ginger, Disp: , Rfl:    aspirin EC 81 MG tablet, Take 1 tablet (81 mg total) by mouth daily. Swallow whole., Disp: 150 tablet, Rfl: 2   empagliflozin (JARDIANCE) 25 MG TABS tablet, Take 1 tablet (25 mg total) by mouth daily., Disp: 90 tablet, Rfl: 3   fluticasone (FLONASE) 50 MCG/ACT nasal spray, Place 2 sprays into both nostrils at bedtime as needed (sinus  drainage)., Disp: 9.9 mL, Rfl: 1   gabapentin (NEURONTIN) 300 MG capsule, TAKE 1 CAPSULE FOUR TIMES DAILY AS NEEDED (Patient taking differently: Take 600 mg by mouth 2 (two) times daily as needed (pain).), Disp: 360 capsule, Rfl: 0   insulin aspart (NOVOLOG) 100 UNIT/ML injection, Inject 16-20 units twice a day (Max daily dose 40 units) (Patient taking differently: Inject 16-20 Units into the skin 3 (three) times daily with meals. Sliding scale), Disp: 20 mL, Rfl: 0   insulin degludec (TRESIBA FLEXTOUCH) 100 UNIT/ML FlexTouch Pen, Inject up to 12 units at bedtime, Disp: 30 mL, Rfl: 0   Insulin Syringes, Disposable, U-100 0.5 ML MISC, Use to inject insulin, Disp: 100 each, Rfl: 2   metFORMIN (GLUCOPHAGE-XR) 750 MG 24 hr tablet, TAKE 2 TABLETS EVERY DAY, Disp: 180 tablet, Rfl: 2   Multiple Vitamin (MULTIVITAMIN WITH MINERALS) TABS tablet, Take 1 tablet by mouth every morning. Centrum, Disp: , Rfl:    pioglitazone (ACTOS) 30 MG tablet, TAKE 1 TABLET EVERY DAY, Disp: 90 tablet, Rfl: 1   rosuvastatin (CRESTOR) 20 MG tablet, Take 1 tablet (20 mg total) by mouth daily., Disp: 90 tablet, Rfl: 2   warfarin (COUMADIN) 5 MG tablet, Take two (2) of your 5mg  peach-colored warfarin tablets on Sundays, Mondays, Wednesdays and Fridays. Take one-and-one-half (1 & 1/2) tablets on Tuesdays, Thursdays and Saturdays., Disp: 64 tablet, Rfl: 1 Past Medical History:  Diagnosis Date   Arthritis  bilateral hips   Cutaneous abscess of left foot    Deep vein thrombosis (DVT) (HCC)    Diabetes mellitus    type II   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 Date of diagnosis February 08 2014 Duration of anticoagulation: 6 months. End date 08/12/2014.  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 Duration of anticoagulation: 8 months. End date 10/11/2014.  Anticoagulant: Lovenox 120 units daily Switched to Eliquis on 05/25/2014 per patient preference    Pulmonary embolism (HCC)    Sebaceous cyst    on back of neck   Social History   Socioeconomic History   Marital status: Single    Spouse name: Not on file   Number of children: 0   Years of education: Not on file   Highest education level: Not on file  Occupational History   Occupation: disabled  Tobacco Use   Smoking status: Never   Smokeless tobacco: Never  Vaping Use   Vaping Use: Never used  Substance and Sexual Activity   Alcohol use: Not Currently    Comment: beer and mixed drink maybe 5  times a month   Drug use: No   Sexual activity: Never  Other Topics Concern   Not on file  Social History Narrative   Not on file   Social Determinants of Health   Financial Resource Strain: Low Risk  (12/15/2021)   Overall Financial Resource Strain (CARDIA)    Difficulty of Paying Living Expenses: Not hard at all  Food Insecurity: No Food Insecurity (03/29/2022)   Hunger Vital Sign    Worried About Running Out of Food in the Last Year: Never true    Ran Out of Food in the Last Year: Never true  Transportation Needs: No Transportation Needs (03/29/2022)   PRAPARE - Administrator, Civil Service (Medical): No    Lack of Transportation (Non-Medical): No  Physical Activity: Inactive (12/15/2021)   Exercise Vital Sign    Days of Exercise per Week: 0 days    Minutes of Exercise per Session: 0 min  Stress: No Stress Concern Present (12/15/2021)   [employer] of Occupational Health - Occupational Stress Questionnaire    Feeling of Stress : Not at all  Social Connections: Socially  Isolated (12/15/2021)   Social Connection and Isolation Panel [NHANES]    Frequency of Communication with Friends and Family: More than three times a week    Frequency of Social Gatherings with Friends and Family: More than three times a week    Attends Religious Services: Never    Database administrator or Organizations: No    Attends Banker Meetings: Never    Marital Status: Never married   Family History  Problem Relation Age of Onset   Breast cancer Mother    Cancer Mother        small intestine   Liver cancer Mother    Diabetes Mother    Diabetes Father  Diabetes Brother    Hypertension Maternal Grandmother    Heart Problems Maternal Grandmother    Diabetes Paternal Grandmother    Diabetes Paternal Grandfather    Diabetes Brother     ASSESSMENT Recent Results: The most recent result is correlated with 52.5 mg per week: Lab Results  Component Value Date   INR 2.1 04/02/2022   INR 2.0 03/19/2022   INR 1.2 (A) 03/05/2022    Anticoagulation Dosing: Description   Take two (2) of your 5mg  strength, peach-colored warfarin tablets on Sundays, Mondays, Wednesdays and Fridays. On Tuesdays, Thursdays, and Saturdays--take only one-and-one-half (1 & 1/2) tablets.      INR today: Therapeutic  PLAN Weekly dose was increased by 19% to 62.5 mg per week  Patient Instructions  Patient instructed to take medications as defined in the Anti-coagulation Track section of this encounter.  Patient instructed to take today's dose.  Patient instructed to take two (2) of your 5mg  strength, peach-colored warfarin tablets on Sundays, Mondays, Wednesdays and Fridays; On  Tuesdays, Thursdays and Saturdays, take only one-and-one-half (1 & 1/2) tablets of your warfarin.  Patient verbalized understanding of these instructions.   Patient advised to contact clinic or seek medical attention if signs/symptoms of bleeding or thromboembolism occur.  Patient verbalized understanding by  repeating back information and was advised to contact me if further medication-related questions arise. Patient was also provided an information handout.  Follow-up Return in 4 weeks (on 04/30/2022) for Follow up INR.  11-08-1988, PharmD, CPP  15 minutes spent face-to-face with the patient during the encounter. 50% of time spent on education, including signs/sx bleeding and clotting, as well as food and drug interactions with warfarin. 50% of time was spent on fingerprick POC INR sample collection,processing, results determination, and documentation in 05/02/2022.

## 2022-04-03 NOTE — Progress Notes (Signed)
INTERNAL MEDICINE TEACHING ATTENDING ADDENDUM   I agree with pharmacy recommendations as outlined in their note.   Darell Saputo, MD  

## 2022-04-06 DIAGNOSIS — H3582 Retinal ischemia: Secondary | ICD-10-CM | POA: Diagnosis not present

## 2022-04-06 DIAGNOSIS — H348111 Central retinal vein occlusion, right eye, with retinal neovascularization: Secondary | ICD-10-CM | POA: Diagnosis not present

## 2022-04-06 LAB — HM DIABETES EYE EXAM

## 2022-04-10 DIAGNOSIS — H2513 Age-related nuclear cataract, bilateral: Secondary | ICD-10-CM | POA: Diagnosis not present

## 2022-04-10 DIAGNOSIS — H4051X3 Glaucoma secondary to other eye disorders, right eye, severe stage: Secondary | ICD-10-CM | POA: Diagnosis not present

## 2022-04-10 DIAGNOSIS — H3411 Central retinal artery occlusion, right eye: Secondary | ICD-10-CM | POA: Diagnosis not present

## 2022-04-11 ENCOUNTER — Other Ambulatory Visit: Payer: Self-pay | Admitting: Internal Medicine

## 2022-04-11 DIAGNOSIS — J309 Allergic rhinitis, unspecified: Secondary | ICD-10-CM

## 2022-04-12 ENCOUNTER — Other Ambulatory Visit: Payer: Self-pay

## 2022-04-12 NOTE — Patient Outreach (Signed)
[facility]) Care Management  04/12/2022  HAWKEN BIELBY 1961-03-04 [id number]   RN CM closing case:  pt will be followed for care coordination by [facility] Care Management.  Bary Leriche, RN, MSN [facility] Care Management Care Management Coordinator Direct Line [phone] Toll Free: [phone]  Fax: [phone]

## 2022-04-17 ENCOUNTER — Telehealth: Payer: Self-pay | Admitting: Internal Medicine

## 2022-04-17 NOTE — Telephone Encounter (Signed)
Notified Rhea at [place] that it is ok to change manufacturer for coumadin.

## 2022-04-17 NOTE — Telephone Encounter (Signed)
Pt call the Pharmacy back about switching the manufacturer of the following medication  Manufacturer  warfarin (COUMADIN) 5 MG tablet    [id number] - , [place] - [address]

## 2022-04-19 ENCOUNTER — Encounter: Payer: Self-pay | Admitting: Dietician

## 2022-04-23 ENCOUNTER — Ambulatory Visit (INDEPENDENT_AMBULATORY_CARE_PROVIDER_SITE_OTHER): Payer: Medicare HMO | Admitting: Pharmacist

## 2022-04-23 ENCOUNTER — Ambulatory Visit (INDEPENDENT_AMBULATORY_CARE_PROVIDER_SITE_OTHER): Payer: Medicare HMO

## 2022-04-23 ENCOUNTER — Other Ambulatory Visit: Payer: Self-pay

## 2022-04-23 VITALS — BP 102/64 | HR 63 | Temp 98.3°F | Ht 72.0 in | Wt 196.0 lb

## 2022-04-23 DIAGNOSIS — Z7901 Long term (current) use of anticoagulants: Secondary | ICD-10-CM

## 2022-04-23 DIAGNOSIS — H349 Unspecified retinal vascular occlusion: Secondary | ICD-10-CM

## 2022-04-23 DIAGNOSIS — L97529 Non-pressure chronic ulcer of other part of left foot with unspecified severity: Secondary | ICD-10-CM | POA: Diagnosis not present

## 2022-04-23 DIAGNOSIS — E11621 Type 2 diabetes mellitus with foot ulcer: Secondary | ICD-10-CM | POA: Diagnosis not present

## 2022-04-23 DIAGNOSIS — H3411 Central retinal artery occlusion, right eye: Secondary | ICD-10-CM

## 2022-04-23 LAB — POCT INR: INR: 1.4 — AB (ref 2.0–3.0)

## 2022-04-23 NOTE — Progress Notes (Signed)
Anticoagulation Management Gregory May is a 61 y.o. male who reports to the clinic for monitoring of warfarin treatment.    Indication:  Pulmonary emboli--resolved; DVT--resolved; Right Retinal Embolus; long term current use of oral anticoagulant, warfarin. Target INR 2.0 - 3.0.    Duration: indefinite Supervising physician: Debe Coder  Anticoagulation Clinic Visit History: Patient does not report signs/symptoms of bleeding or thromboembolism  Other recent changes: No diet, medications, lifestyle changes shared.  Anticoagulation Episode Summary     Current INR goal:  2.0-3.0  TTR:  36.7 % (1.5 mo)  Next INR check:  05/21/2022  INR from last check:  1.4 (04/23/2022)  Weekly max warfarin dose:    Target end date:    INR check location:    Preferred lab:    Send INR reminders to:     Indications   Pulmonary emboli (HCC) (Resolved) [I26.99] DVT (deep venous thrombosis) (HCC) (Resolved) [I82.409] Retinal artery occlusion central [H34.10] Right retinal embolus [H34.9] Long term (current) use of anticoagulants [Z79.01]        Comments:           No Known Allergies  Current Outpatient Medications:    Accu-Chek FastClix Lancets MISC, Use Accu Chek Fastclix lancets to check blood sugar three times daily. DX:E11.65, Disp: 300 each, Rfl: 2   ACCU-CHEK GUIDE test strip, TEST BLOOD SUGAR THREE TIMES DAILY AS DIRECTED, Disp: 300 strip, Rfl: 3   acetaminophen (TYLENOL) 500 MG tablet, Take 500-1,000 mg by mouth at bedtime as needed for mild pain (pain)., Disp: , Rfl:    APPLE CIDER VINEGAR PO, Take 1 each by mouth daily. With Ginger, Disp: , Rfl:    aspirin EC 81 MG tablet, Take 1 tablet (81 mg total) by mouth daily. Swallow whole., Disp: 150 tablet, Rfl: 2   empagliflozin (JARDIANCE) 25 MG TABS tablet, Take 1 tablet (25 mg total) by mouth daily., Disp: 90 tablet, Rfl: 3   fluticasone (FLONASE) 50 MCG/ACT nasal spray, USE 2 SPRAY(S) IN EACH NOSTRIL AT BEDTIME AS NEEDED SINUS   DRAINAGE, Disp: 16 g, Rfl: 0   gabapentin (NEURONTIN) 300 MG capsule, TAKE 1 CAPSULE FOUR TIMES DAILY AS NEEDED (Patient taking differently: Take 600 mg by mouth 2 (two) times daily as needed (pain).), Disp: 360 capsule, Rfl: 0   insulin aspart (NOVOLOG) 100 UNIT/ML injection, Inject 16-20 units twice a day (Max daily dose 40 units) (Patient taking differently: Inject 16-20 Units into the skin 3 (three) times daily with meals. Sliding scale), Disp: 20 mL, Rfl: 0   insulin degludec (TRESIBA FLEXTOUCH) 100 UNIT/ML FlexTouch Pen, Inject up to 12 units at bedtime, Disp: 30 mL, Rfl: 0   Insulin Syringes, Disposable, U-100 0.5 ML MISC, Use to inject insulin, Disp: 100 each, Rfl: 2   metFORMIN (GLUCOPHAGE-XR) 750 MG 24 hr tablet, TAKE 2 TABLETS EVERY DAY, Disp: 180 tablet, Rfl: 2   Multiple Vitamin (MULTIVITAMIN WITH MINERALS) TABS tablet, Take 1 tablet by mouth every morning. Centrum, Disp: , Rfl:    pioglitazone (ACTOS) 30 MG tablet, TAKE 1 TABLET EVERY DAY, Disp: 90 tablet, Rfl: 1   rosuvastatin (CRESTOR) 20 MG tablet, Take 1 tablet (20 mg total) by mouth daily., Disp: 90 tablet, Rfl: 2   warfarin (COUMADIN) 5 MG tablet, Take two (2) of your 5mg  peach-colored warfarin tablets on Sundays, Mondays, Wednesdays and Fridays. Take one-and-one-half (1 & 1/2) tablets on Tuesdays, Thursdays and Saturdays., Disp: 64 tablet, Rfl: 1 Past Medical History:  Diagnosis Date   Arthritis  bilateral hips   Cutaneous abscess of left foot    Deep vein thrombosis (DVT) (HCC)    Diabetes mellitus    type II   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 Date of diagnosis February 08 2014 Duration of anticoagulation: 6 months. End date 08/12/2014.  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 Duration of anticoagulation: 8 months. End date 10/11/2014.  Anticoagulant: Lovenox 120 units daily Switched to Eliquis on 05/25/2014 per patient preference    Pulmonary embolism (HCC)    Sebaceous cyst    on back of neck   Social History   Socioeconomic History   Marital status: Single    Spouse name: Not on file   Number of children: 0   Years of education: Not on file   Highest education level: Not on file  Occupational History   Occupation: disabled  Tobacco Use   Smoking status: Never   Smokeless tobacco: Never  Vaping Use   Vaping Use: Never used  Substance and Sexual Activity   Alcohol use: Not Currently    Comment: beer and mixed drink maybe 5  times a month   Drug use: No   Sexual activity: Never  Other Topics Concern   Not on file  Social History Narrative   Not on file   Social Determinants of Health   Financial Resource Strain: Low Risk  (12/15/2021)   Overall Financial Resource Strain (CARDIA)    Difficulty of Paying Living Expenses: Not hard at all  Food Insecurity: No Food Insecurity (03/29/2022)   Hunger Vital Sign    Worried About Running Out of Food in the Last Year: Never true    Ran Out of Food in the Last Year: Never true  Transportation Needs: No Transportation Needs (03/29/2022)   PRAPARE - Administrator, Civil Service (Medical): No    Lack of Transportation (Non-Medical): No  Physical Activity: Inactive (12/15/2021)   Exercise Vital Sign    Days of Exercise per Week: 0 days    Minutes of Exercise per Session: 0 min  Stress: No Stress Concern Present (12/15/2021)   [employer] of Occupational Health - Occupational Stress Questionnaire    Feeling of Stress : Not at all  Social Connections: Socially  Isolated (12/15/2021)   Social Connection and Isolation Panel [NHANES]    Frequency of Communication with Friends and Family: More than three times a week    Frequency of Social Gatherings with Friends and Family: More than three times a week    Attends Religious Services: Never    Database administrator or Organizations: No    Attends Banker Meetings: Never    Marital Status: Never married   Family History  Problem Relation Age of Onset   Breast cancer Mother    Cancer Mother        small intestine   Liver cancer Mother    Diabetes Mother    Diabetes Father  Diabetes Brother    Hypertension Maternal Grandmother    Heart Problems Maternal Grandmother    Diabetes Paternal Grandmother    Diabetes Paternal Grandfather    Diabetes Brother     ASSESSMENT Recent Results: The most recent result is correlated with 62.5 mg per week: Lab Results  Component Value Date   INR 1.4 (A) 04/23/2022   INR 2.1 04/02/2022   INR 2.0 03/19/2022    Anticoagulation Dosing: Description   Take 2 of your 5mg  peach-colored warfarin tablets all days of the week--EXCEPT on MONDAYS and THURSDAYS, take 2 & 1/2 tablets on Mondays and Thursdays      INR today: Subtherapeutic  PLAN Weekly dose was increased by 16% to 72.5 mg per week  Patient Instructions  Patient instructed to take medications as defined in the Anti-coagulation Track section of this encounter.  Patient instructed to take today's dose.  Patient instructed to take 2 of your 5mg  peach-colored warfarin tablets all days of the week--EXCEPT on MONDAYS and THURSDAYS, take 2 & 1/2 tablets on Mondays and Thursdays  Patient verbalized understanding of these instructions.   Patient advised to contact clinic or seek medical attention if signs/symptoms of bleeding or thromboembolism occur.  Patient verbalized understanding by repeating back information and was advised to contact me if further medication-related questions arise.  Patient was also provided an information handout.  Follow-up Return in about 4 weeks (around 05/21/2022) for Follow up INR.  01-13-1978, PharmD, CPP  15 minutes spent face-to-face with the patient during the encounter. 50% of time spent on education, including signs/sx bleeding and clotting, as well as food and drug interactions with warfarin. 50% of time was spent on fingerprick POC INR sample collection,processing, results determination, and documentation in 07/21/2022.

## 2022-04-23 NOTE — Progress Notes (Signed)
   CC: INR/foot wound check  HPI:  [name] is a 61 y.o.-year-old male with past medical history as below presenting for follow-up of INR and foot wound.  Please see encounters tab for problem-based charting.  Past Medical History:  Diagnosis Date   Arthritis    bilateral hips   Cutaneous abscess of left foot    Deep vein thrombosis (DVT) (HCC)    Diabetes mellitus    type II   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 Date of diagnosis February 08 2014 Duration of anticoagulation: 6 months. End date 08/12/2014.  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 Duration of anticoagulation: 8 months. End date 10/11/2014.  Anticoagulant: Lovenox 120 units daily Switched to Eliquis on 05/25/2014 per patient preference    Pulmonary embolism (HCC)    Sebaceous cyst    on back of neck   Review of Systems: As in HPI.  Please see encounters tab for problem based charting.   Physical Exam:  Vitals:   04/23/22 1611 04/23/22 1618  BP: (!) 97/56 102/64  Pulse: 62 63  Temp: 98.3 F (36.8 C)   TempSrc: Oral   SpO2: 96%   Weight: 196 lb (88.9 kg)   Height: 6' (1.829 m)    General:Well-appearing, pleasant, In NAD Cardiac: RRR, no murmurs rubs or gallops. Respiratory: Normal work of breathing on room air, CTAB Abdominal: Soft, nontender, nondistended MSK: Right BKA with prosthesis.  No ulcers on hands or remaining upper extremity digits.  Left heel pressure wound without surrounding erythema, discharge, or bleeding.  Left  third toe ulcer with no drainage, bleeding.  Assessment & Plan:   Diabetic foot ulcer (HCC) Pressure wound around left heel as well as ulcer on left third toe.  No discharge or bleeding.  No systemic symptoms including fever or hemodynamic instability.  He is anticoagulated, but he was subtherapeutic today.  He has follow-up with orthopedics on Thursday and wanted to check with [place] today if anything needed to be done emergently for his foot wounds.  No necessity for emergent management at this time and he can follow-up with orthopedics on Thursday after his eye surgery.  Long term (current) use of anticoagulants Wanted to get INR checked today before his eye surgery on Wednesday.  INR today is 1.4, with goal at 2-3.  He has been taking a stable regimen of Coumadin at 10 Mg 4 days a week and 7.5 Mg 3 days a week.  He can continue taking Coumadin with goal of therapeutic INR before his surgery.  Discussed patient with Dr. Alexandria Lodge, and appropriate dose changes were relayed to [name].   Patient seen with Dr. Criselda Peaches

## 2022-04-23 NOTE — Patient Instructions (Signed)
GregoryGregory May, it was a pleasure seeing you today! You endorsed feeling well today. Below are some of the things we talked about this visit. We look forward to seeing you in the follow up appointment!  Today we discussed: Your INR is a little low today. Dr. Alexandria Lodge will reach out and let you know what the dose change should be.  Good luck with your eye surgery on Wednesday.  I do not think we need to do anything for your feet in clinic today. Make sure you follow up with Dr. Lajoyce Corners later this week.  I have ordered the following labs today:  Lab Orders  No laboratory test(s) ordered today      Referrals ordered today:   Referral Orders  No referral(s) requested today     I have ordered the following medication/changed the following medications:   Stop the following medications: There are no discontinued medications.   Start the following medications: No orders of the defined types were placed in this encounter.    Follow-up:       Please make sure to arrive 15 minutes prior to your next appointment. If you arrive late, you May be asked to reschedule.   We look forward to seeing you next time. Please call our clinic at [phone] if you have any questions or concerns. The best time to call is Monday-Friday from 9am-4pm, but there is someone available 24/7. If after hours or the weekend, call the main hospital number and ask for the Internal Medicine Resident On-Call. If you need medication refills, please notify your pharmacy one week in advance and they will send [place] a request.  Thank you for letting us take part in your care. Wishing you the best!  Thank you, Lyndle Herrlich MD

## 2022-04-23 NOTE — Assessment & Plan Note (Addendum)
Wanted to get INR checked today before his eye surgery on Wednesday.  INR today is 1.4, with goal at 2-3.  He has been taking a stable regimen of Coumadin at 10 Mg 4 days a week and 7.5 Mg 3 days a week.  He can continue taking Coumadin with goal of therapeutic INR before his surgery.  No acute concerns for bleeding, or coagulopathy.  Discussed patient with Dr. Alexandria Lodge, and appropriate dose changes were relayed to Mr. Jerger.

## 2022-04-23 NOTE — Assessment & Plan Note (Signed)
Pressure wound around left heel as well as ulcer on left third toe.  No discharge or bleeding.  No systemic symptoms including fever or hemodynamic instability.  He is anticoagulated, but he was subtherapeutic today.  He has follow-up with orthopedics on Thursday and wanted to check with [place] today if anything needed to be done emergently for his foot wounds.  No necessity for emergent management at this time and he can follow-up with orthopedics on Thursday after his eye surgery.

## 2022-04-23 NOTE — Patient Instructions (Signed)
Patient instructed to take medications as defined in the Anti-coagulation Track section of this encounter.  Patient instructed to take today's dose.  Patient instructed to take 2 of your 5mg  peach-colored warfarin tablets all days of the week--EXCEPT on MONDAYS and THURSDAYS, take 2 & 1/2 tablets on Mondays and Thursdays  Patient verbalized understanding of these instructions.

## 2022-04-26 ENCOUNTER — Encounter: Payer: Self-pay | Admitting: Orthopedic Surgery

## 2022-04-26 ENCOUNTER — Ambulatory Visit (INDEPENDENT_AMBULATORY_CARE_PROVIDER_SITE_OTHER): Payer: Medicare HMO | Admitting: Orthopedic Surgery

## 2022-04-26 DIAGNOSIS — L97521 Non-pressure chronic ulcer of other part of left foot limited to breakdown of skin: Secondary | ICD-10-CM | POA: Diagnosis not present

## 2022-04-26 DIAGNOSIS — Z89511 Acquired absence of right leg below knee: Secondary | ICD-10-CM | POA: Diagnosis not present

## 2022-04-26 DIAGNOSIS — S88111A Complete traumatic amputation at level between knee and ankle, right lower leg, initial encounter: Secondary | ICD-10-CM

## 2022-04-26 NOTE — Progress Notes (Signed)
Internal Medicine Clinic Attending  Case discussed with Dr. Sridharan  at the time of the visit.  We reviewed the resident's history and exam and pertinent patient test results.  I agree with the assessment, diagnosis, and plan of care documented in the resident's note.  

## 2022-04-26 NOTE — Progress Notes (Signed)
Office Visit Note   Patient: Gregory May           Date of Birth: 09/04/1960           MRN: [id number] Visit Date: 04/26/2022              Requested by: [name], MD No address on file PCP: Gwenevere Abbot, MD  Chief Complaint  Patient presents with   Left Foot - Follow-up      HPI: Patient is a 61 year old gentleman who presents for 2 separate issues #1 status post right transtibial amputation in 2016 #2 left second toe amputation last year as well as venous stasis changes in both lower extremities.  Patient complains of a poor fitting socket on the right.  Assessment & Plan: Visit Diagnoses:  1. Below-knee amputation of right lower extremity (HCC)   2. Ulcer of toe of left foot, limited to breakdown of skin [facility])     Plan: Patient will follow-up with Thayer Ohm at [employer] for a new socket and liner.  Patient states he does have the prescription.  The left heel ulcer has healed nails were trimmed x3.  Follow-Up Instructions: Return in about 3 months (around 07/26/2022).   Ortho Exam  Patient is alert, oriented, no adenopathy, well-dressed, normal affect, normal respiratory effort. Examination patient has callus over the residual limb on the right secondary to subsiding into the socket.  The socket is 59 years old.  Examination the left foot he has a superficial blood blister that has resolved on the left heel.  He has thickened discolored onychomycotic nails x3 and the nails were trimmed x3 without complication of the left foot.  Imaging: No results found. No images are attached to the encounter.  Labs: Lab Results  Component Value Date   HGBA1C 7.2 (H) 03/08/2022   HGBA1C 7.2 (H) 11/29/2021   HGBA1C 6.8 (H) 11/16/2021   ESRSEDRATE 11 11/15/2021   ESRSEDRATE 25 (H) 08/19/2021   ESRSEDRATE 8 07/15/2019   CRP 0.7 11/17/2021   CRP <0.5 11/15/2021   CRP 1.7 (H) 08/19/2021   REPTSTATUS 08/26/2021 FINAL 08/21/2021   GRAMSTAIN  08/21/2021    WBC PRESENT,BOTH  PMN AND MONONUCLEAR NO ORGANISMS SEEN    CULT  08/21/2021    No growth aerobically or anaerobically. Performed at [facility], [address], [place], [place] [address]    [facility] STAPHYLOCOCCUS HOMINIS 08/21/2021     Lab Results  Component Value Date   ALBUMIN 4.2 03/08/2022   ALBUMIN 4.6 11/29/2021   ALBUMIN 3.8 11/15/2021    Lab Results  Component Value Date   MG 1.9 11/15/2021   Lab Results  Component Value Date   VD25OH 16 (L) 11/05/2013    No results found for: "PREALBUMIN"    Latest Ref Rng & Units 02/19/2022   12:40 PM 01/17/2022    3:43 PM 11/16/2021    3:40 AM  CBC EXTENDED  WBC 3.4 - 10.8 x10E3/uL 8.3  8.4  8.2   RBC 4.14 - 5.80 x10E6/uL 4.82  4.88  4.82   Hemoglobin 13.0 - 17.7 g/dL 03.4  74.2  59.5   HCT 37.5 - 51.0 % 45.5  45.9  46.2   Platelets 150 - 450 x10E3/uL 291  297  257      There is no height or weight on file to calculate BMI.  Orders:  No orders of the defined types were placed in this encounter.  No orders of the defined types were  placed in this encounter.    Procedures: No procedures performed  Clinical Data: No additional findings.  ROS:  All other systems negative, except as noted in the HPI. Review of Systems  Objective: Vital Signs: There were no vitals taken for this visit.  Specialty Comments:  No specialty comments available.  PMFS History: Patient Active Problem List   Diagnosis Date Noted   PFO with atrial septal aneurysm    Long term (current) use of anticoagulants 02/26/2022   Right retinal embolus 01/15/2022   DM type 2 with diabetic peripheral neuropathy (HCC) 01/15/2022   Obstructive sleep apnea 01/15/2022   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   Transportation insecurity 11/23/2021   Retinal artery occlusion, central 11/15/2021   Epidermal inclusion cyst 10/18/2021   Acute postoperative pain 09/05/2021   Amputated finger 09/05/2021   Osteomyelitis of finger of right hand (HCC) 08/19/2021    Cellulitis of finger of right hand 08/17/2021   Osteomyelitis of second toe of left foot (HCC)    Hepatic steatosis 06/24/2020   Back pain 04/20/2020   Flu vaccine need 04/20/2020   COVID-19 virus vaccination declined 04/20/2020   Left knee pain 04/20/2020   Allergic sinusitis 04/20/2020   Elevated liver enzymes 04/20/2020   Diastasis of rectus abdominis 07/16/2019   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 10/07/2018   Essential hypertension 06/19/2018   Peripheral neuropathy 12/19/2017   Acquired absence of right leg below knee (HCC) 04/17/2017   Carpal tunnel syndrome 03/18/2017   Colon cancer screening 06/21/2015   Status post below knee amputation of right lower extremity (HCC) 06/07/2015   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) 06/01/2015   Diabetic polyneuropathy associated with type 2 diabetes mellitus (HCC) 12/09/2014   Diabetes mellitus with carpal tunnel syndrome (HCC) 12/09/2014   DJD (degenerative joint disease) of knee 07/21/2014   Osteoarthritis, hip, bilateral 05/25/2014   Onychomycosis 10/14/2013   Pure hypercholesterolemia 09/25/2013   Diabetic foot ulcer (HCC) 09/15/2013   Poorly controlled type 2 diabetes mellitus with complication (HCC) 09/15/1997   Past Medical History:  Diagnosis Date   Arthritis    bilateral hips   Cutaneous abscess of left foot    Deep vein thrombosis (DVT) (HCC)    Diabetes mellitus    type II   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 Date of diagnosis February 08 2014 Duration of anticoagulation: 6 months. End date 08/12/2014.  Anticoagulant: Lovenox 120 units daily Switched to Eliquis on 05/25/2014      Ear drum perforation, right 03/06/2019   Non-pressure chronic ulcer of right calf, limited to breakdown of skin (HCC) 04/17/2017   Pulmonary emboli (HCC) 02/08/2014   Date of  diagnosis February 08 2014, on chest CTA Hospitalized for 3 days Had some symptoms of shortness of breath, and chest pain With intercurrent DVT of the left LE. Duration of anticoagulation: 8 months. End date 10/11/2014.  Anticoagulant: Lovenox 120 units daily Switched to Eliquis on 05/25/2014 per patient preference    Pulmonary embolism (HCC)    Sebaceous cyst    on back of neck    Family History  Problem Relation Age of Onset   Breast cancer Mother    Cancer Mother        small intestine   Liver cancer Mother    Diabetes Mother    Diabetes  Father    Diabetes Brother    Hypertension Maternal Grandmother    Heart Problems Maternal Grandmother    Diabetes Paternal Grandmother    Diabetes Paternal Grandfather    Diabetes Brother     Past Surgical History:  Procedure Laterality Date   AMPUTATION Right 06/03/2015   Procedure: Right Below Knee Amputation;  Surgeon: Nadara Mustard, MD;  Location: [facility] OR;  Service: Orthopedics;  Laterality: Right;   AMPUTATION Left 07/24/2019   Procedure: LEFT FOOT 3RD RAY AMPUTATION;  Surgeon: Nadara Mustard, MD;  Location: [facility] OR;  Service: Orthopedics;  Laterality: Left;   AMPUTATION Left 06/23/2021   Procedure: LEFT 2ND TOE AMPUTATION;  Surgeon: Nadara Mustard, MD;  Location: [facility] OR;  Service: Orthopedics;  Laterality: Left;   AMPUTATION Right 08/21/2021   Procedure: AMPUTATION RIGHT INDEX FINGER;  Surgeon: Gomez Cleverly, MD;  Location: [facility] OR;  Service: Orthopedics;  Laterality: Right;   BUBBLE STUDY  01/24/2022   Procedure: BUBBLE STUDY;  Surgeon: Wendall Stade, MD;  Location: [facility] ENDOSCOPY;  Service: Cardiovascular;;   CLOSED REDUCTION WITH HUMER PIN INSERTION  1974   left hip   HARDWARE REMOVAL Left 07/21/2014   Procedure: HARDWARE REMOVAL;  Surgeon: Loreta Ave, MD;  Location: [facility] OR;  Service: Orthopedics;  Laterality: Left;   I & D EXTREMITY Right 08/21/2021   Procedure: IRRIGATION AND DEBRIDEMENT RIGHT INDEX FINGER;  Surgeon: Gomez Cleverly, MD;   Location: [facility] OR;  Service: Orthopedics;  Laterality: Right;   PATENT FORAMEN OVALE(PFO) CLOSURE N/A 02/28/2022   Procedure: PATENT FORAMEN OVALE(PFO) CLOSURE;  Surgeon: Tonny Bollman, MD;  Location: [facility] INVASIVE CV LAB;  Service: Cardiovascular;  Laterality: N/A;   ROTATOR CUFF REPAIR Right 2005 (approx)   TEE WITHOUT CARDIOVERSION N/A 01/24/2022   Procedure: TRANSESOPHAGEAL ECHOCARDIOGRAM (TEE);  Surgeon: Wendall Stade, MD;  Location: [facility] ENDOSCOPY;  Service: Cardiovascular;  Laterality: N/A;   TOTAL HIP ARTHROPLASTY Left 07/21/2014   Procedure: TOTAL HIP ARTHROPLASTY ANTERIOR APPROACH;  Surgeon: Loreta Ave, MD;  Location: [facility] OR;  Service: Orthopedics;  Laterality: Left;   TOTAL HIP ARTHROPLASTY Right 2006 (approx)   right hip replaced   Social History   Occupational History   Occupation: disabled  Tobacco Use   Smoking status: Never   Smokeless tobacco: Never  Vaping Use   Vaping Use: Never used  Substance and Sexual Activity   Alcohol use: Not Currently    Comment: beer and mixed drink maybe 5  times a month   Drug use: No   Sexual activity: Never

## 2022-04-30 ENCOUNTER — Ambulatory Visit: Payer: Medicare HMO

## 2022-05-09 ENCOUNTER — Other Ambulatory Visit: Payer: Self-pay | Admitting: Pharmacist

## 2022-05-09 MED ORDER — WARFARIN SODIUM 5 MG PO TABS: ORAL_TABLET | ORAL | 2 refills | Status: DC

## 2022-05-11 NOTE — Telephone Encounter (Signed)
Patient called stated that he could not make it to pick up. He sent brother Gregory May to pick up.

## 2022-05-14 ENCOUNTER — Ambulatory Visit (INDEPENDENT_AMBULATORY_CARE_PROVIDER_SITE_OTHER): Payer: Medicare HMO | Admitting: Pharmacist

## 2022-05-14 DIAGNOSIS — Z7901 Long term (current) use of anticoagulants: Secondary | ICD-10-CM

## 2022-05-14 DIAGNOSIS — H349 Unspecified retinal vascular occlusion: Secondary | ICD-10-CM

## 2022-05-14 DIAGNOSIS — H3411 Central retinal artery occlusion, right eye: Secondary | ICD-10-CM

## 2022-05-14 LAB — POCT INR: INR: 1.9 — AB (ref 2.0–3.0)

## 2022-05-14 NOTE — Progress Notes (Signed)
Anticoagulation Management Gregory May is a 61 y.o. male who reports to the clinic for monitoring of warfarin treatment.    Indication:  Right retinal embolism/stroke; long term current use of oral anticoagulant warfarin. Target INR 2.0 - 3.0   Duration: indefinite Supervising physician: Earl Lagos  Anticoagulation Clinic Visit History: Patient does not report signs/symptoms of bleeding or thromboembolism  Other recent changes: No diet, medications, lifestyle changes identified by the patient at this visit.  Anticoagulation Episode Summary     Current INR goal:  2.0-3.0  TTR:  25.4 % (2.2 mo)  Next INR check:  05/28/2022  INR from last check:  1.9 (05/14/2022)  Weekly max warfarin dose:    Target end date:    INR check location:    Preferred lab:    Send INR reminders to:     Indications   Pulmonary emboli (HCC) (Resolved) [I26.99] DVT (deep venous thrombosis) (HCC) (Resolved) [I82.409] Retinal artery occlusion central [H34.10] Right retinal embolus [H34.9] Long term (current) use of anticoagulants [Z79.01]        Comments:           No Known Allergies  Current Outpatient Medications:    Accu-Chek FastClix Lancets MISC, Use Accu Chek Fastclix lancets to check blood sugar three times daily. DX:E11.65, Disp: 300 each, Rfl: 2   ACCU-CHEK GUIDE test strip, TEST BLOOD SUGAR THREE TIMES DAILY AS DIRECTED, Disp: 300 strip, Rfl: 3   acetaminophen (TYLENOL) 500 MG tablet, Take 500-1,000 mg by mouth at bedtime as needed for mild pain (pain)., Disp: , Rfl:    APPLE CIDER VINEGAR PO, Take 1 each by mouth daily. With Ginger, Disp: , Rfl:    aspirin EC 81 MG tablet, Take 1 tablet (81 mg total) by mouth daily. Swallow whole., Disp: 150 tablet, Rfl: 2   empagliflozin (JARDIANCE) 25 MG TABS tablet, Take 1 tablet (25 mg total) by mouth daily., Disp: 90 tablet, Rfl: 3   fluticasone (FLONASE) 50 MCG/ACT nasal spray, USE 2 SPRAY(S) IN EACH NOSTRIL AT BEDTIME AS NEEDED SINUS   DRAINAGE, Disp: 16 g, Rfl: 0   gabapentin (NEURONTIN) 300 MG capsule, TAKE 1 CAPSULE FOUR TIMES DAILY AS NEEDED (Patient taking differently: Take 600 mg by mouth 2 (two) times daily as needed (pain).), Disp: 360 capsule, Rfl: 0   insulin aspart (NOVOLOG) 100 UNIT/ML injection, Inject 16-20 units twice a day (Max daily dose 40 units) (Patient taking differently: Inject 16-20 Units into the skin 3 (three) times daily with meals. Sliding scale), Disp: 20 mL, Rfl: 0   insulin degludec (TRESIBA FLEXTOUCH) 100 UNIT/ML FlexTouch Pen, Inject up to 12 units at bedtime, Disp: 30 mL, Rfl: 0   Insulin Syringes, Disposable, U-100 0.5 ML MISC, Use to inject insulin, Disp: 100 each, Rfl: 2   metFORMIN (GLUCOPHAGE-XR) 750 MG 24 hr tablet, TAKE 2 TABLETS EVERY DAY, Disp: 180 tablet, Rfl: 2   Multiple Vitamin (MULTIVITAMIN WITH MINERALS) TABS tablet, Take 1 tablet by mouth every morning. Centrum, Disp: , Rfl:    pioglitazone (ACTOS) 30 MG tablet, TAKE 1 TABLET EVERY DAY, Disp: 90 tablet, Rfl: 1   rosuvastatin (CRESTOR) 20 MG tablet, Take 1 tablet (20 mg total) by mouth daily., Disp: 90 tablet, Rfl: 2   warfarin (COUMADIN) 5 MG tablet, Take two (2) of your 5mg  peach-colored warfarin tablets on Sundays, Tuesdays, Wednesdays, Fridays and Saturdays. Take two-and-one-half (2 & 1/2) tablets on Mondays, and Thursdays., Disp: 60 tablet, Rfl: 2 Past Medical History:  Diagnosis Date  Arthritis    bilateral hips   Cutaneous abscess of left foot    Deep vein thrombosis (DVT) (HCC)    Diabetes mellitus    type II   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 Date of diagnosis February 08 2014 Duration of anticoagulation: 6 months. End date 08/12/2014.  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 Duration of anticoagulation: 8 months. End date 10/11/2014.  Anticoagulant: Lovenox 120 units daily Switched to Eliquis on 05/25/2014 per patient preference    Pulmonary embolism (HCC)    Sebaceous cyst    on back of neck   Social History   Socioeconomic History   Marital status: Single    Spouse name: Not on file   Number of children: 0   Years of education: Not on file   Highest education level: Not on file  Occupational History   Occupation: disabled  Tobacco Use   Smoking status: Never   Smokeless tobacco: Never  Vaping Use   Vaping Use: Never used  Substance and Sexual Activity   Alcohol use: Not Currently    Comment: beer and mixed drink maybe 5  times a month   Drug use: No   Sexual activity: Never  Other Topics Concern   Not on file  Social History Narrative   Not on file   Social Determinants of Health   Financial Resource Strain: Low Risk  (12/15/2021)   Overall Financial Resource Strain (CARDIA)    Difficulty of Paying Living Expenses: Not hard at all  Food Insecurity: No Food Insecurity (03/29/2022)   Hunger Vital Sign    Worried About Running Out of Food in the Last Year: Never true    Ran Out of Food in the Last Year: Never true  Transportation Needs: No Transportation Needs (03/29/2022)   PRAPARE - Administrator, Civil Service (Medical): No    Lack of Transportation (Non-Medical): No  Physical Activity: Inactive (12/15/2021)   Exercise Vital Sign    Days of Exercise per Week: 0 days    Minutes of Exercise per Session: 0 min  Stress: No Stress Concern Present (12/15/2021)   [employer] of Occupational Health - Occupational Stress Questionnaire    Feeling of Stress : Not at all  Social Connections: Socially  Isolated (12/15/2021)   Social Connection and Isolation Panel [NHANES]    Frequency of Communication with Friends and Family: More than three times a week    Frequency of Social Gatherings with Friends and Family: More than three times a week    Attends Religious Services: Never    Database administrator or Organizations: No    Attends Banker Meetings: Never    Marital Status: Never married   Family History  Problem Relation Age of Onset   Breast cancer Mother    Cancer Mother        small intestine   Liver cancer Mother    Diabetes Mother    Diabetes  Father    Diabetes Brother    Hypertension Maternal Grandmother    Heart Problems Maternal Grandmother    Diabetes Paternal Grandmother    Diabetes Paternal Grandfather    Diabetes Brother     ASSESSMENT Recent Results: The most recent result is correlated with 75 mg per week: Lab Results  Component Value Date   INR 1.9 (A) 05/14/2022   INR 1.4 (A) 04/23/2022   INR 2.1 04/02/2022    Anticoagulation Dosing: Description   Take 2 and 1/2 of your 5mg  peach-colored warfarin tablets all days of the week.     INR today: Subtherapeutic  PLAN Weekly dose was increased by 20% to 87.5 mg per week  Patient Instructions  Patient instructed to take medications as defined in the Anti-coagulation Track section of this encounter.  Patient instructed to take today's dose.  Patient instructed to take 2 and 1/2 of your 5mg  peach-colored warfarin tablets all days of the week. Patient verbalized understanding of these instructions.   Patient advised to contact clinic or seek medical attention if signs/symptoms of bleeding or thromboembolism occur.  Patient verbalized understanding by repeating back information and was advised to contact me if further medication-related questions arise. Patient was also provided an information handout.  Follow-up Return in 2 weeks (on 05/28/2022) for Follow up INR.  Pennie Banter, PharmD,  CPP  15 minutes spent face-to-face with the patient during the encounter. 50% of time spent on education, including signs/sx bleeding and clotting, as well as food and drug interactions with warfarin. 50% of time was spent on fingerprick POC INR sample collection,processing, results determination, and documentation in http://www.kim.net/.

## 2022-05-14 NOTE — Patient Instructions (Signed)
Patient instructed to take medications as defined in the Anti-coagulation Track section of this encounter.  Patient instructed to take today's dose.  Patient instructed to take 2 and 1/2 of your 5mg  peach-colored warfarin tablets all days of the week. Patient verbalized understanding of these instructions.

## 2022-05-14 NOTE — Progress Notes (Signed)
INTERNAL MEDICINE TEACHING ATTENDING ADDENDUM - Aldine Contes M.D  Duration- indefinite, Indication- DVT, PE, central retinal artery occlusion, INR- subtherapeutic. Agree with pharmacy recommendations as outlined in their note.

## 2022-05-18 ENCOUNTER — Other Ambulatory Visit: Payer: Self-pay | Admitting: Pharmacist

## 2022-05-18 ENCOUNTER — Telehealth: Payer: Self-pay | Admitting: *Deleted

## 2022-05-18 MED ORDER — ENOXAPARIN SODIUM 80 MG/0.8ML IJ SOSY: 80.0000 mg | PREFILLED_SYRINGE | Freq: Two times a day (BID) | INTRAMUSCULAR | 1 refills | Status: DC

## 2022-05-18 NOTE — Telephone Encounter (Signed)
Received call from [facility] at [facility] requesting clarification on qty of lovenox. Wanting to know if it truly is 10 mL or should this be 10 syringes. States they have 100 mg in stock but do not have 80 mg in stock. If it is for 10 syringes of 80 mg she will call around to other pharmacies to see if they have this in stock. Patient is to start this med tomorrow. Please advise/send new Rx.

## 2022-05-21 ENCOUNTER — Ambulatory Visit: Payer: Medicare HMO

## 2022-05-22 DIAGNOSIS — H4051X3 Glaucoma secondary to other eye disorders, right eye, severe stage: Secondary | ICD-10-CM | POA: Diagnosis not present

## 2022-05-23 ENCOUNTER — Telehealth: Payer: Self-pay

## 2022-05-23 NOTE — Patient Outreach (Signed)
  Care Coordination   Follow Up Visit Note   05/23/2022 Name: Gregory May MRN: [id number] DOB: February 13, 1961  Gregory May is a 61 y.o. year old male who sees Idamae Schuller, MD for primary care. I spoke with  Gregory May by phone today.  What matters to the patients health and wellness today?  Continues health maintenance and diabetes management    Goals Addressed             This Visit's Progress    Diabetes Management       Care Coordination Interventions: Provided education to patient about basic DM disease process Reviewed medications with patient and discussed importance of medication adherence Discussed plans with patient for ongoing care management follow up and provided patient with direct contact information for care management team Continue follow ups with eye doctor for eye pressure           SDOH assessments and interventions completed:  Yes     Care Coordination Interventions Activated:  Yes  Care Coordination Interventions:  Yes, provided   Follow up plan: Follow up call scheduled for December    Encounter Outcome:  Pt. Visit Completed   Jone Baseman, RN, MSN [place] [facility] Care Management Coordinator Direct Line [phone]

## 2022-05-23 NOTE — Patient Instructions (Signed)
Visit Information  Thank you for taking time to visit with me today. Please don't hesitate to contact me if I can be of assistance to you.   Following are the goals we discussed today:   Goals Addressed             This Visit's Progress    Diabetes Management       Care Coordination Interventions: Provided education to patient about basic DM disease process Reviewed medications with patient and discussed importance of medication adherence Discussed plans with patient for ongoing care management follow up and provided patient with direct contact information for care management team Continue follow ups with eye doctor for eye pressure           Our next appointment is by telephone on 07/18/22 at 1030 am  Please call the care guide team at [phone] if you need to cancel or reschedule your appointment.   If you are experiencing a Mental Health or [place] or need someone to talk to, please call the Suicide and Crisis Lifeline: 988   The patient verbalized understanding of instructions, educational materials, and care plan provided today and agreed to receive a mailed copy of patient instructions, educational materials, and care plan.   Telephone follow up appointment with care management team member scheduled for: December  Gregory May J Gregory May, [place], MSN [place] [facility] Care Management Coordinator Direct Line [phone]

## 2022-05-28 ENCOUNTER — Ambulatory Visit (INDEPENDENT_AMBULATORY_CARE_PROVIDER_SITE_OTHER): Payer: Medicare HMO | Admitting: Pharmacist

## 2022-05-28 DIAGNOSIS — H3411 Central retinal artery occlusion, right eye: Secondary | ICD-10-CM

## 2022-05-28 DIAGNOSIS — H349 Unspecified retinal vascular occlusion: Secondary | ICD-10-CM

## 2022-05-28 DIAGNOSIS — Z7901 Long term (current) use of anticoagulants: Secondary | ICD-10-CM

## 2022-05-28 LAB — POCT INR: INR: 2.1 (ref 2.0–3.0)

## 2022-05-28 NOTE — Progress Notes (Signed)
Evaluation and management procedures were performed by the Clinical Pharmacy Practitioner under my supervision and collaboration. I have reviewed the Practitioner's note and chart, and I agree with the management and plan as documented above. ° °

## 2022-05-28 NOTE — Patient Instructions (Signed)
Patient instructed to take medications as defined in the Anti-coagulation Track section of this encounter.  Patient instructed to take today's dose.  Patient instructed to take  2 and 1/2 of your 5mg  peach-colored warfarin tablets all days of the week--EXCEPT on MONDAYS, WEDNESDAYS and FRIDAYS--take three (3) of your 5mg  peach-colored warfarin tablets on these days.

## 2022-05-28 NOTE — Progress Notes (Signed)
Anticoagulation Management Gregory May is a 61 y.o. male who reports to the clinic for monitoring of warfarin treatment.    Indication:  Retinal artery occlusion, central; Right eye retinal embolism; long term current use of oral anticoagulant, warfarin. Target INR range 2.0 - 3.0.    Duration: indefinite Supervising physician: Gilles Chiquito  Anticoagulation Clinic Visit History: Patient does not report signs/symptoms of bleeding or thromboembolism  Other recent changes: No diet, medications, lifestyle changes endorsed by the patient at this visit.  Anticoagulation Episode Summary     Current INR goal:  2.0-3.0  TTR:  29.6 % (2.7 mo)  Next INR check:  06/11/2022  INR from last check:  2.1 (05/28/2022)  Weekly max warfarin dose:    Target end date:    INR check location:    Preferred lab:    Send INR reminders to:     Indications   Pulmonary emboli (HCC) (Resolved) [I26.99] DVT (deep venous thrombosis) (HCC) (Resolved) [I82.409] Retinal artery occlusion central [H34.10] Right retinal embolus [H34.9] Long term (current) use of anticoagulants [Z79.01]        Comments:           No Known Allergies  Current Outpatient Medications:    Accu-Chek FastClix Lancets MISC, Use Accu Chek Fastclix lancets to check blood sugar three times daily. DX:E11.65, Disp: 300 each, Rfl: 2   ACCU-CHEK GUIDE test strip, TEST BLOOD SUGAR THREE TIMES DAILY AS DIRECTED, Disp: 300 strip, Rfl: 3   acetaminophen (TYLENOL) 500 MG tablet, Take 500-1,000 mg by mouth at bedtime as needed for mild pain (pain)., Disp: , Rfl:    APPLE CIDER VINEGAR PO, Take 1 each by mouth daily. With Ginger, Disp: , Rfl:    aspirin EC 81 MG tablet, Take 1 tablet (81 mg total) by mouth daily. Swallow whole., Disp: 150 tablet, Rfl: 2   empagliflozin (JARDIANCE) 25 MG TABS tablet, Take 1 tablet (25 mg total) by mouth daily., Disp: 90 tablet, Rfl: 3   fluticasone (FLONASE) 50 MCG/ACT nasal spray, USE 2 SPRAY(S) IN EACH  NOSTRIL AT BEDTIME AS NEEDED SINUS  DRAINAGE, Disp: 16 g, Rfl: 0   gabapentin (NEURONTIN) 300 MG capsule, TAKE 1 CAPSULE FOUR TIMES DAILY AS NEEDED (Patient taking differently: Take 600 mg by mouth 2 (two) times daily as needed (pain).), Disp: 360 capsule, Rfl: 0   insulin aspart (NOVOLOG) 100 UNIT/ML injection, Inject 16-20 units twice a day (Max daily dose 40 units) (Patient taking differently: Inject 16-20 Units into the skin 3 (three) times daily with meals. Sliding scale), Disp: 20 mL, Rfl: 0   insulin degludec (TRESIBA FLEXTOUCH) 100 UNIT/ML FlexTouch Pen, Inject up to 12 units at bedtime, Disp: 30 mL, Rfl: 0   Insulin Syringes, Disposable, U-100 0.5 ML MISC, Use to inject insulin, Disp: 100 each, Rfl: 2   metFORMIN (GLUCOPHAGE-XR) 750 MG 24 hr tablet, TAKE 2 TABLETS EVERY DAY, Disp: 180 tablet, Rfl: 2   Multiple Vitamin (MULTIVITAMIN WITH MINERALS) TABS tablet, Take 1 tablet by mouth every morning. Centrum, Disp: , Rfl:    pioglitazone (ACTOS) 30 MG tablet, TAKE 1 TABLET EVERY DAY, Disp: 90 tablet, Rfl: 1   rosuvastatin (CRESTOR) 20 MG tablet, Take 1 tablet (20 mg total) by mouth daily., Disp: 90 tablet, Rfl: 2   warfarin (COUMADIN) 5 MG tablet, Take two (2) of your 5mg  peach-colored warfarin tablets on Sundays, Tuesdays, Wednesdays, Fridays and Saturdays. 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 Date of diagnosis February 08 2014 Duration of anticoagulation: 6 months. End date 08/12/2014.  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 Duration of anticoagulation: 8 months. End date 10/11/2014.  Anticoagulant: Lovenox 120 units daily Switched to Eliquis on 05/25/2014 per patient preference    Pulmonary embolism (HCC)    Sebaceous cyst    on back of neck   Social History   Socioeconomic History   Marital status: Single    Spouse name: Not on file   Number of children: 0   Years of education: Not on file   Highest education level: Not on file  Occupational History   Occupation: disabled  Tobacco Use   Smoking status: Never   Smokeless tobacco: Never  Vaping Use   Vaping Use: Never used  Substance and Sexual Activity   Alcohol use: Not Currently    Comment: beer and mixed drink maybe 5  times a month   Drug use: No   Sexual activity: Never  Other Topics Concern   Not on file  Social History Narrative   Not on file   Social Determinants of Health   Financial Resource Strain: Low Risk  (12/15/2021)   Overall Financial Resource Strain (CARDIA)    Difficulty of Paying Living Expenses: Not hard at all  Food Insecurity: No Food Insecurity (03/29/2022)   Hunger Vital Sign    Worried About Running Out of Food in the Last Year: Never true    Ran Out of Food in the Last Year: Never true  Transportation Needs: No Transportation Needs (03/29/2022)   PRAPARE - Administrator, Civil Service (Medical): No    Lack of Transportation (Non-Medical): No  Physical Activity: Inactive (12/15/2021)   Exercise Vital Sign    Days of Exercise per Week: 0 days    Minutes of Exercise per Session: 0 min  Stress: No Stress Concern Present (12/15/2021)   [employer] of Occupational Health - Occupational Stress Questionnaire    Feeling of Stress : Not at all   Social Connections: Socially Isolated (12/15/2021)   Social Connection and Isolation Panel [NHANES]    Frequency of Communication with Friends and Family: More than three times a week    Frequency of Social Gatherings with Friends and Family: More than three times a week    Attends Religious Services: Never    Database administrator or Organizations: No    Attends Banker Meetings: Never    Marital Status: Never married   Family History  Problem Relation Age of Onset   Breast cancer Mother    Cancer Mother        small intestine   Liver cancer Mother  Diabetes Mother    Diabetes Father    Diabetes Brother    Hypertension Maternal Grandmother    Heart Problems Maternal Grandmother    Diabetes Paternal Grandmother    Diabetes Paternal Grandfather    Diabetes Brother     ASSESSMENT Recent Results: The most recent result is correlated with 87.5 mg per week: Lab Results  Component Value Date   INR 2.1 05/28/2022   INR 1.9 (A) 05/14/2022   INR 1.4 (A) 04/23/2022    Anticoagulation Dosing: Description   Take 2 and 1/2 of your 5mg  peach-colored warfarin tablets all days of the week--EXCEPT on MONDAYS, WEDNESDAYS and FRIDAYS--take three (3) of your 5mg  peach-colored warfarin tablets on these days.      INR today: Therapeutic  PLAN Weekly dose was increased by 9% to 95 mg per week  Patient Instructions  Patient instructed to take medications as defined in the Anti-coagulation Track section of this encounter.  Patient instructed to take today's dose.  Patient instructed to take  2 and 1/2 of your 5mg  peach-colored warfarin tablets all days of the week--EXCEPT on MONDAYS, WEDNESDAYS and FRIDAYS--take three (3) of your 5mg  peach-colored warfarin tablets on these days.  Patient advised to contact clinic or seek medical attention if signs/symptoms of bleeding or thromboembolism occur.  Patient verbalized understanding by repeating back information and was advised to  contact me if further medication-related questions arise. Patient was also provided an information handout.  Follow-up Return in 2 weeks (on 06/11/2022) for Follow up INR.  , PharmD, CPP  15 minutes spent face-to-face with the patient during the encounter. 50% of time spent on education, including signs/sx bleeding and clotting, as well as food and drug interactions with warfarin. 50% of time was spent on fingerprick POC INR sample collection,processing, results determination, and documentation in .

## 2022-06-11 ENCOUNTER — Ambulatory Visit: Payer: Medicare HMO

## 2022-06-12 ENCOUNTER — Other Ambulatory Visit: Payer: Medicare HMO

## 2022-06-14 ENCOUNTER — Ambulatory Visit: Payer: Medicare HMO | Admitting: Endocrinology

## 2022-06-18 ENCOUNTER — Ambulatory Visit (INDEPENDENT_AMBULATORY_CARE_PROVIDER_SITE_OTHER): Payer: Medicare HMO | Admitting: Pharmacist

## 2022-06-18 DIAGNOSIS — H3411 Central retinal artery occlusion, right eye: Secondary | ICD-10-CM

## 2022-06-18 DIAGNOSIS — Z7901 Long term (current) use of anticoagulants: Secondary | ICD-10-CM

## 2022-06-18 DIAGNOSIS — H349 Unspecified retinal vascular occlusion: Secondary | ICD-10-CM

## 2022-06-18 LAB — POCT INR
INR: 3.3 — AB (ref 2.0–3.0)
POC INR: 3.3

## 2022-06-18 NOTE — Progress Notes (Signed)
Anticoagulation Management Gregory May is a 61 y.o. male who reports to the clinic for monitoring of warfarin treatment.    Indication: Retinal artery occlusion, central; Right retinal embolus; Long term (current) use of anticoagulants. Duration: indefinite Supervising physician: [facility] [facility] Visit History: Patient does not report signs/symptoms of bleeding or thromboembolism  Other recent changes: No diet, medications, lifestyle changes.  Anticoagulation Episode Summary     Current INR goal:  2.0-3.0  TTR:  38.9 % (3.4 mo)  Next INR check:  07/09/2022  INR from last check:  3.30 (06/18/2022)  Weekly max warfarin dose:    Target end date:    INR check location:    Preferred lab:    Send INR reminders to:     Indications   Pulmonary emboli (HCC) (Resolved) [I26.99] DVT (deep venous thrombosis) (HCC) (Resolved) [I82.409] Retinal artery occlusion central [H34.10] Right retinal embolus [H34.9] Long term (current) use of anticoagulants [Z79.01]        Comments:           No Known Allergies  Current Outpatient Medications:    Accu-Chek FastClix Lancets MISC, Use Accu Chek Fastclix lancets to check blood sugar three times daily. DX:E11.65, Disp: 300 each, Rfl: 2   ACCU-CHEK GUIDE test strip, TEST BLOOD SUGAR THREE TIMES DAILY AS DIRECTED, Disp: 300 strip, Rfl: 3   acetaminophen (TYLENOL) 500 MG tablet, Take 500-1,000 mg by mouth at bedtime as needed for mild pain (pain)., Disp: , Rfl:    APPLE CIDER VINEGAR PO, Take 1 each by mouth daily. With Ginger, Disp: , Rfl:    aspirin EC 81 MG tablet, Take 1 tablet (81 mg total) by mouth daily. Swallow whole., Disp: 150 tablet, Rfl: 2   empagliflozin (JARDIANCE) 25 MG TABS tablet, Take 1 tablet (25 mg total) by mouth daily., Disp: 90 tablet, Rfl: 3   fluticasone (FLONASE) 50 MCG/ACT nasal spray, USE 2 SPRAY(S) IN EACH NOSTRIL AT BEDTIME AS NEEDED SINUS  DRAINAGE, Disp: 16 g, Rfl: 0   gabapentin  (NEURONTIN) 300 MG capsule, TAKE 1 CAPSULE FOUR TIMES DAILY AS NEEDED (Patient taking differently: Take 600 mg by mouth 2 (two) times daily as needed (pain).), Disp: 360 capsule, Rfl: 0   insulin aspart (NOVOLOG) 100 UNIT/ML injection, Inject 16-20 units twice a day (Max daily dose 40 units) (Patient taking differently: Inject 16-20 Units into the skin 3 (three) times daily with meals. Sliding scale), Disp: 20 mL, Rfl: 0   insulin degludec (TRESIBA FLEXTOUCH) 100 UNIT/ML FlexTouch Pen, Inject up to 12 units at bedtime, Disp: 30 mL, Rfl: 0   Insulin Syringes, Disposable, U-100 0.5 ML MISC, Use to inject insulin, Disp: 100 each, Rfl: 2   metFORMIN (GLUCOPHAGE-XR) 750 MG 24 hr tablet, TAKE 2 TABLETS EVERY DAY, Disp: 180 tablet, Rfl: 2   Multiple Vitamin (MULTIVITAMIN WITH MINERALS) TABS tablet, Take 1 tablet by mouth every morning. Centrum, Disp: , Rfl:    pioglitazone (ACTOS) 30 MG tablet, TAKE 1 TABLET EVERY DAY, Disp: 90 tablet, Rfl: 1   rosuvastatin (CRESTOR) 20 MG tablet, Take 1 tablet (20 mg total) by mouth daily., Disp: 90 tablet, Rfl: 2   warfarin (COUMADIN) 5 MG tablet, Take two (2) of your 5mg  peach-colored warfarin tablets on Sundays, Tuesdays, Wednesdays, Fridays and Saturdays. 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 Date of diagnosis February 08 2014 Duration of anticoagulation: 6 months. End date 08/12/2014.  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 Duration of anticoagulation: 8 months. End date 10/11/2014.  Anticoagulant: Lovenox 120 units daily Switched to Eliquis on 05/25/2014 per patient preference    Pulmonary embolism ([place])    Sebaceous cyst    on back of neck   Social History   Socioeconomic History   Marital status: Single    Spouse name: Not on file   Number of children: 0   Years of education: Not on file   Highest education level: Not on file  Occupational History   Occupation: disabled  Tobacco Use   Smoking status: Never   Smokeless tobacco: Never  Vaping Use   Vaping Use: Never used  Substance and Sexual Activity   Alcohol use: Not Currently    Comment: beer and mixed drink maybe 5  times a month   Drug use: No   Sexual activity: Never  Other Topics Concern   Not on file  Social History Narrative   Not on file   Social Determinants of Health   Financial Resource Strain: Low Risk  (12/15/2021)   Overall Financial Resource Strain (CARDIA)    Difficulty of Paying Living Expenses: Not hard at all  Food Insecurity: No Food Insecurity (03/29/2022)   Hunger Vital Sign    Worried About Running Out of Food in the Last Year: Never true    Ran Out of Food in the Last Year: Never true  Transportation Needs: No Transportation Needs (03/29/2022)   PRAPARE - Hydrologist (Medical): No    Lack of Transportation (Non-Medical): No  Physical Activity: Inactive (12/15/2021)   Exercise Vital Sign    Days of Exercise per Week: 0 days    Minutes of Exercise per Session: 0 min  Stress: No Stress Concern Present (12/15/2021)   [place]    Feeling of Stress : Not at all  Social Connections: Socially Isolated (12/15/2021)   Social Connection and  Isolation Panel [NHANES]    Frequency of Communication with Friends and Family: More than three times a week    Frequency of Social Gatherings with Friends and Family: More than three times a week    Attends Religious Services: Never    Marine scientist or Organizations: No    Attends Archivist Meetings: Never    Marital Status: Never married   Family History  Problem Relation Age of Onset   Breast cancer Mother    Cancer Mother        small intestine   Liver cancer Mother    Diabetes Mother    Diabetes Father    Diabetes Brother    Hypertension Maternal Grandmother    Heart  Problems Maternal Grandmother    Diabetes Paternal Grandmother    Diabetes Paternal Grandfather    Diabetes Brother     ASSESSMENT Recent Results: The most recent result is correlated with 95 mg per week: Lab Results  Component Value Date   INR 3.3 (A) 06/18/2022   INR 3.30 06/18/2022   INR 2.1 05/28/2022    Anticoagulation Dosing: Description   Take 2 & 1/2 of your 5 mg peach-colored warfarin tablets on Sundays, Tuesdays, Wednesdays, Fridays and Saturdays. On MONDAYS and THURSDAYS--take 3 of your 5 mg peach-colored warfarin tablets.      INR today: Supratherapeutic  PLAN Weekly dose was decreased by 3% to 92.5 mg per week  Patient Instructions  Patient instructed to take medications as defined in the Anti-coagulation Track section of this encounter.  Patient instructed to take today's dose.  Patient instructed to take 2 & 1/2 of your 5 mg peach-colored warfarin tablets on Sundays, Tuesdays, Wednesdays, Fridays and Saturdays. On MONDAYS and THURSDAYS--take 3 of your 5 mg peach-colored warfarin tablets.  Patient verbalized understanding of these instructions.  Patient advised to contact clinic or seek medical attention if signs/symptoms of bleeding or thromboembolism occur.  Patient verbalized understanding by repeating back information and was advised to contact me if further  medication-related questions arise. Patient was also provided an information handout.  Follow-up Return in 3 weeks (on 07/09/2022) for Follow up INR.  Pennie Banter, PharmD, CPP  15 minutes spent face-to-face with the patient during the encounter. 50% of time spent on education, including signs/sx bleeding and clotting, as well as food and drug interactions with warfarin. 50% of time was spent on fingerprick POC INR sample collection,processing, results determination, and documentation in [email or web address].[email or web address]

## 2022-06-18 NOTE — Patient Instructions (Signed)
Patient instructed to take medications as defined in the Anti-coagulation Track section of this encounter.  Patient instructed to take today's dose.  Patient instructed to take 2 & 1/2 of your 5 mg peach-colored warfarin tablets on Sundays, Tuesdays, Wednesdays, Fridays and Saturdays. On MONDAYS and THURSDAYS--take 3 of your 5 mg peach-colored warfarin tablets.  Patient verbalized understanding of these instructions.

## 2022-06-26 ENCOUNTER — Telehealth: Payer: Self-pay

## 2022-06-26 NOTE — Telephone Encounter (Signed)
Lvm for pt advised patient assistance Novolog (5 vials) and Tresiba (1 box) have been delivered and is ready for pick.

## 2022-07-09 ENCOUNTER — Ambulatory Visit (INDEPENDENT_AMBULATORY_CARE_PROVIDER_SITE_OTHER): Payer: Medicare HMO | Admitting: Pharmacist

## 2022-07-09 DIAGNOSIS — Z7901 Long term (current) use of anticoagulants: Secondary | ICD-10-CM | POA: Diagnosis not present

## 2022-07-09 DIAGNOSIS — H3411 Central retinal artery occlusion, right eye: Secondary | ICD-10-CM

## 2022-07-09 DIAGNOSIS — H349 Unspecified retinal vascular occlusion: Secondary | ICD-10-CM

## 2022-07-09 LAB — POCT INR: INR: 4.1 — AB (ref 2.0–3.0)

## 2022-07-09 NOTE — Progress Notes (Signed)
Anticoagulation Management Gregory May is a 61 y.o. male who reports to the clinic for monitoring of warfarin treatment.    Indication:  Central retinal occlusion, right eye;  History of Right MCA Stroke, Long term current use of oral anticoagulant, warfarin. Target INR range 2.0 - 3.0.   Duration: indefinite Supervising physician: Carlynn Purl  Anticoagulation Clinic Visit History: Patient does not report signs/symptoms of bleeding or thromboembolism  Other recent changes: NO diet, medications, lifestyle changes cited.  Anticoagulation Episode Summary     Current INR goal:  2.0-3.0  TTR:  32.3 % (4.1 mo)  Next INR check:  07/23/2022  INR from last check:  4.1 (07/09/2022)  Weekly max warfarin dose:    Target end date:    INR check location:    Preferred lab:    Send INR reminders to:     Indications   Pulmonary emboli (HCC) (Resolved) [I26.99] DVT (deep venous thrombosis) (HCC) (Resolved) [I82.409] Retinal artery occlusion central [H34.10] Right retinal embolus [H34.9] Long term (current) use of anticoagulants [Z79.01]        Comments:           No Known Allergies  Current Outpatient Medications:    Accu-Chek FastClix Lancets MISC, Use Accu Chek Fastclix lancets to check blood sugar three times daily. DX:E11.65, Disp: 300 each, Rfl: 2   ACCU-CHEK GUIDE test strip, TEST BLOOD SUGAR THREE TIMES DAILY AS DIRECTED, Disp: 300 strip, Rfl: 3   acetaminophen (TYLENOL) 500 MG tablet, Take 500-1,000 mg by mouth at bedtime as needed for mild pain (pain)., Disp: , Rfl:    APPLE CIDER VINEGAR PO, Take 1 each by mouth daily. With Ginger, Disp: , Rfl:    aspirin EC 81 MG tablet, Take 1 tablet (81 mg total) by mouth daily. Swallow whole., Disp: 150 tablet, Rfl: 2   empagliflozin (JARDIANCE) 25 MG TABS tablet, Take 1 tablet (25 mg total) by mouth daily., Disp: 90 tablet, Rfl: 3   fluticasone (FLONASE) 50 MCG/ACT nasal spray, USE 2 SPRAY(S) IN EACH NOSTRIL AT BEDTIME AS NEEDED  SINUS  DRAINAGE, Disp: 16 g, Rfl: 0   gabapentin (NEURONTIN) 300 MG capsule, TAKE 1 CAPSULE FOUR TIMES DAILY AS NEEDED (Patient taking differently: Take 600 mg by mouth 2 (two) times daily as needed (pain).), Disp: 360 capsule, Rfl: 0   insulin aspart (NOVOLOG) 100 UNIT/ML injection, Inject 16-20 units twice a day (Max daily dose 40 units) (Patient taking differently: Inject 16-20 Units into the skin 3 (three) times daily with meals. Sliding scale), Disp: 20 mL, Rfl: 0   insulin degludec (TRESIBA FLEXTOUCH) 100 UNIT/ML FlexTouch Pen, Inject up to 12 units at bedtime, Disp: 30 mL, Rfl: 0   Insulin Syringes, Disposable, U-100 0.5 ML MISC, Use to inject insulin, Disp: 100 each, Rfl: 2   metFORMIN (GLUCOPHAGE-XR) 750 MG 24 hr tablet, TAKE 2 TABLETS EVERY DAY, Disp: 180 tablet, Rfl: 2   Multiple Vitamin (MULTIVITAMIN WITH MINERALS) TABS tablet, Take 1 tablet by mouth every morning. Centrum, Disp: , Rfl:    pioglitazone (ACTOS) 30 MG tablet, TAKE 1 TABLET EVERY DAY, Disp: 90 tablet, Rfl: 1   rosuvastatin (CRESTOR) 20 MG tablet, Take 1 tablet (20 mg total) by mouth daily., Disp: 90 tablet, Rfl: 2   warfarin (COUMADIN) 5 MG tablet, Take two (2) of your 5mg  peach-colored warfarin tablets on Sundays, Tuesdays, Wednesdays, Fridays and Saturdays. Take two-and-one-half (2 & 1/2) tablets on Mondays, and Thursdays., Disp: 60 tablet, Rfl: 2 Past Medical History:  Diagnosis  Date   Arthritis    bilateral hips   Cutaneous abscess of left foot    Deep vein thrombosis (DVT) (HCC)    Diabetes mellitus    type II   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 Date of diagnosis February 08 2014 Duration of anticoagulation: 6 months. End date 08/12/2014.  Anticoagulant: Lovenox 120 units daily Switched to Eliquis on 05/25/2014      Ear drum perforation, right 03/06/2019   Non-pressure chronic ulcer of right calf, limited  to breakdown of skin (HCC) 04/17/2017   Pulmonary emboli (HCC) 02/08/2014   Date of diagnosis February 08 2014, on chest CTA Hospitalized for 3 days Had some symptoms of shortness of breath, and chest pain With intercurrent DVT of the left LE. Duration of anticoagulation: 8 months. End date 10/11/2014.  Anticoagulant: Lovenox 120 units daily Switched to Eliquis on 05/25/2014 per patient preference    Pulmonary embolism (HCC)    Sebaceous cyst    on back of neck   Social History   Socioeconomic History   Marital status: Single    Spouse name: Not on file   Number of children: 0   Years of education: Not on file   Highest education level: Not on file  Occupational History   Occupation: disabled  Tobacco Use   Smoking status: Never   Smokeless tobacco: Never  Vaping Use   Vaping Use: Never used  Substance and Sexual Activity   Alcohol use: Not Currently    Comment: beer and mixed drink maybe 5  times a month   Drug use: No   Sexual activity: Never  Other Topics Concern   Not on file  Social History Narrative   Not on file   Social Determinants of Health   Financial Resource Strain: Low Risk  (12/15/2021)   Overall Financial Resource Strain (CARDIA)    Difficulty of Paying Living Expenses: Not hard at all  Food Insecurity: No Food Insecurity (03/29/2022)   Hunger Vital Sign    Worried About Running Out of Food in the Last Year: Never true    Ran Out of Food in the Last Year: Never true  Transportation Needs: No Transportation Needs (03/29/2022)   PRAPARE - Administrator, Civil Service (Medical): No    Lack of Transportation (Non-Medical): No  Physical Activity: Inactive (12/15/2021)   Exercise Vital Sign    Days of Exercise per Week: 0 days    Minutes of Exercise per Session: 0 min  Stress: No Stress Concern Present (12/15/2021)   [employer] of Occupational Health - Occupational Stress Questionnaire    Feeling of Stress : Not at all  Social Connections: Socially  Isolated (12/15/2021)   Social Connection and Isolation Panel [NHANES]    Frequency of Communication with Friends and Family: More than three times a week    Frequency of Social Gatherings with Friends and Family: More than three times a week    Attends Religious Services: Never    Database administrator or Organizations: No    Attends Banker Meetings: Never    Marital Status: Never married   Family History  Problem Relation Age of Onset   Breast cancer Mother    Cancer Mother        small intestine   Liver cancer Mother    Diabetes Mother  Diabetes Father    Diabetes Brother    Hypertension Maternal Grandmother    Heart Problems Maternal Grandmother    Diabetes Paternal Grandmother    Diabetes Paternal Grandfather    Diabetes Brother     ASSESSMENT Recent Results: The most recent result is correlated with 92.5 mg per week: Lab Results  Component Value Date   INR 4.1 (A) 07/09/2022   INR 3.3 (A) 06/18/2022   INR 3.30 06/18/2022    Anticoagulation Dosing: Description   Take 2 & 1/2 of your 5 mg peach-colored warfarin tablets ALL DAYS OF THE WEEK.  OMIT today's dose.      INR today: Supratherapeutic  PLAN Weekly dose was decreased by 5% to 87.5 mg per week AND T TIME OMISSION of TODAY's DOSE.   Patient Instructions  Patient instructed to take medications as defined in the Anti-coagulation Track section of this encounter.  Patient instructed to OMIT today's dose.  Patient instructed to take 2 and 1/2 of your five (5) milligram warfarin tablets commencing on Tuesday, July 10, 2022. Patient verbalized understanding of these instructions.  Patient advised to contact clinic or seek medical attention if signs/symptoms of bleeding or thromboembolism occur.  Patient verbalized understanding by repeating back information and was advised to contact me if further medication-related questions arise. Patient was also provided an information  handout.  Follow-up Return in 2 weeks (on 07/23/2022) for Follow up INR.  Elicia Lamp, PharmD, CPP  15 minutes spent face-to-face with the patient during the encounter. 50% of time spent on education, including signs/sx bleeding and clotting, as well as food and drug interactions with warfarin. 50% of time was spent on fingerprick POC INR sample collection,processing, results determination, and documentation in [email or web address].

## 2022-07-09 NOTE — Patient Instructions (Signed)
Patient instructed to take medications as defined in the Anti-coagulation Track section of this encounter.  Patient instructed to OMIT today's dose.  Patient instructed to take 2 and 1/2 of your five (5) milligram warfarin tablets commencing on Tuesday, July 10, 2022. Patient verbalized understanding of these instructions.

## 2022-07-11 ENCOUNTER — Telehealth: Payer: Self-pay

## 2022-07-11 NOTE — Telephone Encounter (Signed)
Mr. Umbach called to confirm his stop date for aspirin since Hulen Luster told him to continue.  Per Noreene Larsson McDaniel's 03/28/22 office note, he was instructed to stop ASA 05/31/22. He is still on Coumadin.  Reiterated to the patient that he does not need to take ASA from a Cardiology perspective, but to confirm with Hulen Luster if he is to be on it for something else. Will remove ASA from the patient's med list. The patient will request it added back if he is supposed to take it. The patient was grateful for call and agreed with plan.

## 2022-07-11 NOTE — Progress Notes (Signed)
INTERNAL MEDICINE TEACHING ATTENDING ADDENDUM - Hideko Esselman M.D  Duration- indefinite, Indication- central retinal artery occlusion, INR- supratherapeutic. Agree with pharmacy recommendations as outlined in their note.

## 2022-07-18 ENCOUNTER — Ambulatory Visit: Payer: Self-pay

## 2022-07-18 NOTE — Patient Outreach (Signed)
  Care Coordination   07/18/2022 Name: Gregory May MRN: [id number] DOB: 07-Jul-1961   Care Coordination Outreach Attempts:  An unsuccessful telephone outreach was attempted today to offer the patient information about available care coordination services as a benefit of their health plan.   Follow Up Plan:  Additional outreach attempts will be made to offer the patient care coordination information and services.   Encounter Outcome:  No Answer   Care Coordination Interventions:  No, not indicated    Bary Leriche, RN, MSN [facility] Care Management Care Management Coordinator Direct Line [phone]

## 2022-07-18 NOTE — Progress Notes (Deleted)
No chief complaint on file.     ASSESSMENT AND PLAN  Gregory May is a 61 y.o. 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, 2021  Also evidence of embolic stroke  On Xarelto now,  S/p PFO closure 02/28/2022  Bilateral lower extremity DVT, on anticoagulation  Hypercoagulable study was negative  Long history of diabetes, with diabetic complications, diabetic peripheral neuropathy, right BKA  Continue follow-up with cardiologist, return to clinic in 6 months High risk for obstructive sleep apnea  Patient complains of excessive daytime sleepiness, fatigue, high risk for obstructive sleep apnea, refer to sleep study   DIAGNOSTIC DATA (LABS, IMAGING, TESTING) - I reviewed patient records, labs, notes, testing and imaging myself where available.   MEDICAL HISTORY:  Update 07/19/2022 JM: Patient returns for 89-monthfollow-up visit.   Overall stable.  Denies any new stroke/TIA symptoms.  S/p PFO closure 02/28/2022 by Dr. CBurt Knackwithout complication.  Placed on aspirin and warfarin postprocedure, he has since completed aspirin duration and remains on warfarin.  INR levels monitored by PCP.  He has not yet pursued sleep evaluation as he wished to hold off      History provided from Dr. YKrista Blueinitial consult visit for reference purposes only Gregory T HCabinessis a 61year old, seen in request by   ATimothy May for evaluation of right retinal stroke, he is accompanied by his at today's visit on January 15, 2022  I reviewed and summarized the referring note. PMHX. DM,  HLD Stroke S/p right BKA in 2006 Diabetic peripheral neuropathy  He has been on disability due to his diabetic complications, right index finger amputation, left toes amputation and right BKA, long history of diabetic peripheral neuropathy, seeing Dr. KDwyane Deeendocrinologist, most recent A1c was 7.2,  He woke up November 15, 2021 noticed his room seems to be dimmer, was seen by ophthalmologist the same day,  then referred to emergency room right retinal artery occlusion,  Personally reviewed MRI of brain multiple acute small DWI lesion involving right MCA territory, right head of caudate, right temporal, subependymoma temporal, right temporoparietal lobe, right posterior frontal cortex bilaterally, evidence of old cerebellar stroke, bilateral thalamus, most consistent with embolic event,  CT angiogram of head and neck showed no large vessel disease, aortic atherosclerosis.  Echocardiogram showed aneurysm intra-atrial septum, agitated saline consistent with right-to-left shunting across the atrial septum  Doppler study showed bilateral lower extremity DVT, he was initially treated with Eliquis, now on Xarelto samples due to cost concerns Extensive laboratory evaluations, A1c 7.2, 101, CMP with elevated glucose 146, normal creatinine 0.94, mild elevated AST, ALT, negative antiphospholipid antibody and other hypercoagulable panel, normal ESR, C-reactive protein  He was seen by cardiologist Dr. CBurt May insurance does not approve for suggested 30 days cardiac monitoring, TEE is pending soon, if confirmed, may consider PFO closure,  He also had a history of pulm emboli DVT in 2015, took anticoagulation for 8 months at that time,  Patient complains of excessive daytime fatigue, sleepiness,           PHYSICAL EXAM:   There were no vitals filed for this visit.  Not recorded     There is no height or weight on file to calculate BMI.  PHYSICAL EXAMNIATION:  Gen: NAD, conversant, well nourised, well groomed                     Cardiovascular: Regular rate rhythm, no peripheral edema, warm, nontender. Eyes: Conjunctivae clear without exudates  or hemorrhage Neck: Supple, no carotid bruits. Pulmonary: Clear to auscultation bilaterally   NEUROLOGICAL EXAM:  MENTAL STATUS: Speech/cognition: Awake, alert, oriented to history taking and casual conversation CRANIAL NERVES: CN II: Visual  fields are full to confrontation.  Right pupillary afferent defect, light sensitivity only, right optic disc showed very thin stringed arterial CN III, IV, VI: extraocular movement are normal. No ptosis. CN V: Facial sensation is intact to light touch CN VII: Face is symmetric with normal eye closure  CN VIII: Hearing is normal to causal conversation. CN IX, X: Phonation is normal. CN XI: Head turning and shoulder shrug are intact  MOTOR: Left index finger amputation, right BKA, no significant muscle weakness otherwise  REFLEXES: Reflexes are hypoactive and symmetric at the biceps, triceps, absent at knees, and ankles. Plantar responses are flexor.  SENSORY: Length-dependent decreased vibratory sensation light touch pinprick to left knee level  COORDINATION: There is no trunk or limb dysmetria noted.  GAIT/STANCE: Need push-up to get up from seated position, unsteady  REVIEW OF SYSTEMS:  Full 14 system review of systems performed and notable only for as above All other review of systems were negative.   ALLERGIES: No Known Allergies  HOME MEDICATIONS: Current Outpatient Medications  Medication Sig Dispense Refill   Accu-Chek FastClix Lancets MISC Use Accu Chek Fastclix lancets to check blood sugar three times daily. DX:E11.65 300 each 2   ACCU-CHEK GUIDE test strip TEST BLOOD SUGAR THREE TIMES DAILY AS DIRECTED 300 strip 3   acetaminophen (TYLENOL) 500 MG tablet Take 500-1,000 mg by mouth at bedtime as needed for mild pain (pain).     APPLE CIDER VINEGAR PO Take 1 each by mouth daily. With Ginger     empagliflozin (JARDIANCE) 25 MG TABS tablet Take 1 tablet (25 mg total) by mouth daily. 90 tablet 3   fluticasone (FLONASE) 50 MCG/ACT nasal spray USE 2 SPRAY(S) IN EACH NOSTRIL AT BEDTIME AS NEEDED SINUS  DRAINAGE 16 g 0   gabapentin (NEURONTIN) 300 MG capsule TAKE 1 CAPSULE FOUR TIMES DAILY AS NEEDED (Patient taking differently: Take 600 mg by mouth 2 (two) times daily as needed  (pain).) 360 capsule 0   insulin aspart (NOVOLOG) 100 UNIT/ML injection Inject 16-20 units twice a day (Max daily dose 40 units) (Patient taking differently: Inject 16-20 Units into the skin 3 (three) times daily with meals. Sliding scale) 20 mL 0   insulin degludec (TRESIBA FLEXTOUCH) 100 UNIT/ML FlexTouch Pen Inject up to 12 units at bedtime 30 mL 0   Insulin Syringes, Disposable, U-100 0.5 ML MISC Use to inject insulin 100 each 2   metFORMIN (GLUCOPHAGE-XR) 750 MG 24 hr tablet TAKE 2 TABLETS EVERY DAY 180 tablet 2   Multiple Vitamin (MULTIVITAMIN WITH MINERALS) TABS tablet Take 1 tablet by mouth every morning. Centrum     pioglitazone (ACTOS) 30 MG tablet TAKE 1 TABLET EVERY DAY 90 tablet 1   rosuvastatin (CRESTOR) 20 MG tablet Take 1 tablet (20 mg total) by mouth daily. 90 tablet 2   warfarin (COUMADIN) 5 MG tablet Take two (2) of your 79m peach-colored warfarin tablets on Sundays, Tuesdays, Wednesdays, Fridays and Saturdays. Take two-and-one-half (2 & 1/2) tablets on Mondays, and Thursdays. 60 tablet 2   No current facility-administered medications for this visit.    PAST MEDICAL HISTORY: Past Medical History:  Diagnosis Date   Arthritis    bilateral hips   Cutaneous abscess of left foot    [place]  Diabetes mellitus    type II   Diabetic ulcer of heel ([place])    Right heel   DJD (degenerative joint disease)    DVT (deep venous thrombosis) ([place]) 02/08/2014   Proximal provoked. Date of diagnosis February 08 2014 Duration of anticoagulation: 6 months. End date 08/12/2014.  Anticoagulant: Lovenox 120 units daily Switched to Eliquis on 05/25/2014      Ear drum perforation, right 03/06/2019   Non-pressure chronic ulcer of right calf, limited to breakdown of skin ([place]) 04/17/2017   Pulmonary emboli ([place]) 02/08/2014   Date of diagnosis February 08 2014, on chest CTA Hospitalized for 3 days Had some symptoms of shortness of breath, and chest pain With intercurrent DVT of the  left LE. Duration of anticoagulation: 8 months. End date 10/11/2014.  Anticoagulant: Lovenox 120 units daily Switched to Eliquis on 05/25/2014 per patient preference    Pulmonary embolism ([place])    Sebaceous cyst    on back of neck    PAST SURGICAL HISTORY: Past Surgical History:  Procedure Laterality Date   AMPUTATION Right 06/03/2015   Procedure: Right Below Knee Amputation;  Surgeon: [name], MD;  Location: [place];  Service: Orthopedics;  Laterality: Right;   AMPUTATION Left 07/24/2019   Procedure: LEFT FOOT 3RD RAY AMPUTATION;  Surgeon: [name], MD;  Location: [place];  Service: Orthopedics;  Laterality: Left;   AMPUTATION Left 06/23/2021   Procedure: LEFT 2ND TOE AMPUTATION;  Surgeon: [name], MD;  Location: [place];  Service: Orthopedics;  Laterality: Left;   AMPUTATION Right 08/21/2021   Procedure: AMPUTATION RIGHT INDEX FINGER;  Surgeon: Orene Desanctis, MD;  Location: [place];  Service: Orthopedics;  Laterality: Right;   BUBBLE STUDY  01/24/2022   Procedure: BUBBLE STUDY;  Surgeon: Josue Hector, MD;  Location: [place];  Service: Cardiovascular;;   CLOSED REDUCTION WITH HUMER PIN INSERTION  1974   left hip   HARDWARE REMOVAL Left 07/21/2014   Procedure: HARDWARE REMOVAL;  Surgeon: Ninetta Lights, MD;  Location: [place];  Service: Orthopedics;  Laterality: Left;   I & D EXTREMITY Right 08/21/2021   Procedure: IRRIGATION AND DEBRIDEMENT RIGHT INDEX FINGER;  Surgeon: Orene Desanctis, MD;  Location: [place];  Service: Orthopedics;  Laterality: Right;   PATENT FORAMEN OVALE(PFO) CLOSURE N/A 02/28/2022   Procedure: PATENT FORAMEN OVALE(PFO) CLOSURE;  Surgeon: Sherren Mocha, MD;  Location: [facility];  Service: Cardiovascular;  Laterality: N/A;   ROTATOR CUFF REPAIR Right 2005 (approx)   TEE WITHOUT CARDIOVERSION N/A 01/24/2022   Procedure: TRANSESOPHAGEAL ECHOCARDIOGRAM (TEE);  Surgeon: Josue Hector, MD;  Location[facility];  Service: Cardiovascular;  Laterality:  N/A;   TOTAL HIP ARTHROPLASTY Left 07/21/2014   Procedure: TOTAL HIP ARTHROPLASTY ANTERIOR APPROACH;  Surgeon: Ninetta Lights, MD;  Location: [place];  Service: Orthopedics;  Laterality: Left;   TOTAL HIP ARTHROPLASTY Right 2006 (approx)   right hip replaced    FAMILY HISTORY: Family History  Problem Relation Age of Onset   Breast cancer Mother    Cancer Mother        small intestine   Liver cancer Mother    Diabetes Mother    Diabetes Father    Diabetes Brother    Hypertension Maternal Grandmother    Heart Problems Maternal Grandmother    Diabetes Paternal Grandmother    Diabetes Paternal Grandfather    Diabetes Brother     SOCIAL HISTORY: Social History   Socioeconomic History   Marital  status: Single    Spouse name: Not on file   Number of children: 0   Years of education: Not on file   Highest education level: Not on file  Occupational History   Occupation: disabled  Tobacco Use   Smoking status: Never   Smokeless tobacco: Never  Vaping Use   Vaping Use: Never used  Substance and Sexual Activity   Alcohol use: Not Currently    Comment: beer and mixed drink maybe 5  times a month   Drug use: No   Sexual activity: Never  Other Topics Concern   Not on file  Social History Narrative   Not on file   Social Determinants of Health   Financial Resource Strain: Low Risk  (12/15/2021)   Overall Financial Resource Strain (CARDIA)    Difficulty of Paying Living Expenses: Not hard at all  Food Insecurity: No Food Insecurity (03/29/2022)   Hunger Vital Sign    Worried About Running Out of Food in the Last Year: Never true    [place] in the Last Year: Never true  Transportation Needs: No Transportation Needs (03/29/2022)   PRAPARE - Hydrologist (Medical): No    Lack of Transportation (Non-Medical): No  Physical Activity: Inactive (12/15/2021)   Exercise Vital Sign    Days of Exercise per Week: 0 days    Minutes of Exercise per  Session: 0 min  Stress: No Stress Concern Present (12/15/2021)   [place]    Feeling of Stress : Not at all  Social Connections: Socially Isolated (12/15/2021)   Social Connection and Isolation Panel [NHANES]    Frequency of Communication with Friends and Family: More than three times a week    Frequency of Social Gatherings with Friends and Family: More than three times a week    Attends Religious Services: Never    Marine scientist or Organizations: No    Attends Archivist Meetings: Never    Marital Status: Never married  Intimate Partner Violence: Not At Risk (12/15/2021)   Humiliation, Afraid, Rape, and Kick questionnaire    Fear of Current or Ex-Partner: No    Emotionally Abused: No    Physically Abused: No    Sexually Abused: No      I spent *** minutes of face-to-face and non-face-to-face time with patient.  This included previsit chart review, lab review, study review, order entry, electronic health record documentation, patient education  Frann Rider, [facility]  [facility] [address] [place], [place] [address]  Phone [phone] Fax [phone] Note: This document was prepared with digital dictation and possible smart phrase technology. Any transcriptional errors that result from this process are unintentional.

## 2022-07-19 ENCOUNTER — Ambulatory Visit: Payer: Medicare HMO | Admitting: Adult Health

## 2022-07-19 ENCOUNTER — Telehealth: Payer: Self-pay

## 2022-07-19 NOTE — Telephone Encounter (Signed)
Patient states he called [facility] about jardiance. They state he has no more refills but will be sending paperwork out to [place] to complete.

## 2022-07-23 ENCOUNTER — Ambulatory Visit (INDEPENDENT_AMBULATORY_CARE_PROVIDER_SITE_OTHER): Payer: Medicare HMO | Admitting: Pharmacist

## 2022-07-23 DIAGNOSIS — Z7901 Long term (current) use of anticoagulants: Secondary | ICD-10-CM

## 2022-07-23 DIAGNOSIS — H3411 Central retinal artery occlusion, right eye: Secondary | ICD-10-CM

## 2022-07-23 DIAGNOSIS — H349 Unspecified retinal vascular occlusion: Secondary | ICD-10-CM | POA: Diagnosis not present

## 2022-07-23 LAB — POCT INR: INR: 3 (ref 2.0–3.0)

## 2022-07-23 MED ORDER — WARFARIN SODIUM 5 MG PO TABS: ORAL_TABLET | ORAL | 2 refills | Status: DC

## 2022-07-23 NOTE — Progress Notes (Signed)
Anticoagulation Management Gregory May is a 61 y.o. male who reports to the clinic for monitoring of warfarin treatment.    Indication:  Central retinal occlusion; right retinal embolism; History of R MCA stroke; long term current use of oral anticoagulant, warfarin. Target INR 2.0 - 3.0.    Duration: indefinite Supervising physician:  Velna Ochs, MD  Anticoagulation Clinic Visit History: Patient does not report signs/symptoms of bleeding or thromboembolism  Other recent changes: No diet, medications, lifestyle changes cited by the patient at this visit.  Anticoagulation Episode Summary     Current INR goal:  2.0-3.0  TTR:  29.0 % (4.6 mo)  Next INR check:  08/14/2022  INR from last check:  3.0 (07/23/2022)  Weekly max warfarin dose:    Target end date:    INR check location:    Preferred lab:    Send INR reminders to:     Indications   Pulmonary emboli (HCC) (Resolved) [I26.99] DVT (deep venous thrombosis) (HCC) (Resolved) [I82.409] Retinal artery occlusion central [H34.10] Right retinal embolus [H34.9] Long term (current) use of anticoagulants [Z79.01]        Comments:           No Known Allergies  Current Outpatient Medications:    Accu-Chek FastClix Lancets MISC, Use Accu Chek Fastclix lancets to check blood sugar three times daily. DX:E11.65, Disp: 300 each, Rfl: 2   ACCU-CHEK GUIDE test strip, TEST BLOOD SUGAR THREE TIMES DAILY AS DIRECTED, Disp: 300 strip, Rfl: 3   acetaminophen (TYLENOL) 500 MG tablet, Take 500-1,000 mg by mouth at bedtime as needed for mild pain (pain)., Disp: , Rfl:    APPLE CIDER VINEGAR PO, Take 1 each by mouth daily. With Ginger, Disp: , Rfl:    empagliflozin (JARDIANCE) 25 MG TABS tablet, Take 1 tablet (25 mg total) by mouth daily., Disp: 90 tablet, Rfl: 3   fluticasone (FLONASE) 50 MCG/ACT nasal spray, USE 2 SPRAY(S) IN EACH NOSTRIL AT BEDTIME AS NEEDED SINUS  DRAINAGE, Disp: 16 g, Rfl: 0   gabapentin (NEURONTIN) 300 MG  capsule, TAKE 1 CAPSULE FOUR TIMES DAILY AS NEEDED (Patient taking differently: Take 600 mg by mouth 2 (two) times daily as needed (pain).), Disp: 360 capsule, Rfl: 0   insulin aspart (NOVOLOG) 100 UNIT/ML injection, Inject 16-20 units twice a day (Max daily dose 40 units) (Patient taking differently: Inject 16-20 Units into the skin 3 (three) times daily with meals. Sliding scale), Disp: 20 mL, Rfl: 0   insulin degludec (TRESIBA FLEXTOUCH) 100 UNIT/ML FlexTouch Pen, Inject up to 12 units at bedtime, Disp: 30 mL, Rfl: 0   Insulin Syringes, Disposable, U-100 0.5 ML MISC, Use to inject insulin, Disp: 100 each, Rfl: 2   metFORMIN (GLUCOPHAGE-XR) 750 MG 24 hr tablet, TAKE 2 TABLETS EVERY DAY, Disp: 180 tablet, Rfl: 2   Multiple Vitamin (MULTIVITAMIN WITH MINERALS) TABS tablet, Take 1 tablet by mouth every morning. Centrum, Disp: , Rfl:    pioglitazone (ACTOS) 30 MG tablet, TAKE 1 TABLET EVERY DAY, Disp: 90 tablet, Rfl: 1   rosuvastatin (CRESTOR) 20 MG tablet, Take 1 tablet (20 mg total) by mouth daily., Disp: 90 tablet, Rfl: 2   warfarin (COUMADIN) 5 MG tablet, Take 2 (two) of your 5 mg peach-colored warfarin tablets on Mondays and Thursdays. All OTHER DAYS, (Sundays, Tuesdays, Wednesdays, Fridays and Saturdays) take two and one-half ( 2 & 1/2 tablets.), Disp: 68 tablet, Rfl: 2 Past Medical History:  Diagnosis Date   Arthritis    bilateral hips  Cutaneous abscess of left foot    Deep vein thrombosis (DVT) (HCC)    Diabetes mellitus    type II   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 Date of diagnosis February 08 2014 Duration of anticoagulation: 6 months. End date 08/12/2014.  Anticoagulant: Lovenox 120 units daily Switched to Eliquis on 05/25/2014      Ear drum perforation, right 03/06/2019   Non-pressure chronic ulcer of right calf, limited to breakdown of skin (HCC) 04/17/2017   Pulmonary  emboli (HCC) 02/08/2014   Date of diagnosis February 08 2014, on chest CTA Hospitalized for 3 days Had some symptoms of shortness of breath, and chest pain With intercurrent DVT of the left LE. Duration of anticoagulation: 8 months. End date 10/11/2014.  Anticoagulant: Lovenox 120 units daily Switched to Eliquis on 05/25/2014 per patient preference    Pulmonary embolism (HCC)    Sebaceous cyst    on back of neck   Social History   Socioeconomic History   Marital status: Single    Spouse name: Not on file   Number of children: 0   Years of education: Not on file   Highest education level: Not on file  Occupational History   Occupation: disabled  Tobacco Use   Smoking status: Never   Smokeless tobacco: Never  Vaping Use   Vaping Use: Never used  Substance and Sexual Activity   Alcohol use: Not Currently    Comment: beer and mixed drink maybe 5  times a month   Drug use: No   Sexual activity: Never  Other Topics Concern   Not on file  Social History Narrative   Not on file   Social Determinants of Health   Financial Resource Strain: Low Risk  (12/15/2021)   Overall Financial Resource Strain (CARDIA)    Difficulty of Paying Living Expenses: Not hard at all  Food Insecurity: No Food Insecurity (03/29/2022)   Hunger Vital Sign    Worried About Running Out of Food in the Last Year: Never true    Ran Out of Food in the Last Year: Never true  Transportation Needs: No Transportation Needs (03/29/2022)   PRAPARE - Administrator, Civil Service (Medical): No    Lack of Transportation (Non-Medical): No  Physical Activity: Inactive (12/15/2021)   Exercise Vital Sign    Days of Exercise per Week: 0 days    Minutes of Exercise per Session: 0 min  Stress: No Stress Concern Present (12/15/2021)   [employer] of Occupational Health - Occupational Stress Questionnaire    Feeling of Stress : Not at all  Social Connections: Socially Isolated (12/15/2021)   Social Connection and  Isolation Panel [NHANES]    Frequency of Communication with Friends and Family: More than three times a week    Frequency of Social Gatherings with Friends and Family: More than three times a week    Attends Religious Services: Never    Database administrator or Organizations: No    Attends Banker Meetings: Never    Marital Status: Never married   Family History  Problem Relation Age of Onset   Breast cancer Mother    Cancer Mother        small intestine   Liver cancer Mother    Diabetes Mother    Diabetes Father    Diabetes Brother  Hypertension Maternal Grandmother    Heart Problems Maternal Grandmother    Diabetes Paternal Grandmother    Diabetes Paternal Grandfather    Diabetes Brother     ASSESSMENT Recent Results: The most recent result is correlated with 87.5 mg per week: Lab Results  Component Value Date   INR 3.0 07/23/2022   INR 4.1 (A) 07/09/2022   INR 3.3 (A) 06/18/2022   INR 3.30 06/18/2022    Anticoagulation Dosing: Description   Take 2 (two) of your 5 mg peach-colored warfarin tablets on Mondays and Thursdays. All OTHER DAYS, (Sundays, Tuesdays, Wednesdays, Fridays and Saturdays) take two and one-half ( 2 & 1/2 tablets.)       INR today: Therapeutic  PLAN Weekly dose was decreased by 6% to 82.5 mg per week  Patient Instructions  Patient instructed to take medications as defined in the Anti-coagulation Track section of this encounter.  Patient instructed to take today's dose.  Patient instructed to take 2 (two) of your 5 mg peach-colored warfarin tablets on Mondays and Thursdays. All OTHER DAYS, (Sundays, Tuesdays, Wednesdays, Fridays and Saturdays) take two and one-half ( 2 & 1/2 tablets.)   Patient verbalized understanding of these instructions.  Patient advised to contact clinic or seek medical attention if signs/symptoms of bleeding or thromboembolism occur.  Patient verbalized understanding by repeating back information and was  advised to contact me if further medication-related questions arise. Patient was also provided an information handout.  Follow-up Return in 22 days (on 08/14/2022) for Follow up INR.  Pennie Banter, PharmD, CPP  15 minutes spent face-to-face with the patient during the encounter. 50% of time spent on education, including signs/sx bleeding and clotting, as well as food and drug interactions with warfarin. 50% of time was spent on fingerprick POC INR sample collection,processing, results determination, and documentation in http://www.kim.net/.

## 2022-07-23 NOTE — Patient Instructions (Signed)
Patient instructed to take medications as defined in the Anti-coagulation Track section of this encounter.  Patient instructed to take today's dose.  Patient instructed to take 2 (two) of your 5 mg peach-colored warfarin tablets on Mondays and Thursdays. All OTHER DAYS, (Sundays, Tuesdays, Wednesdays, Fridays and Saturdays) take two and one-half ( 2 & 1/2 tablets.)   Patient verbalized understanding of these instructions.

## 2022-07-26 ENCOUNTER — Ambulatory Visit (INDEPENDENT_AMBULATORY_CARE_PROVIDER_SITE_OTHER): Payer: Medicare HMO | Admitting: Orthopedic Surgery

## 2022-07-26 DIAGNOSIS — Z89511 Acquired absence of right leg below knee: Secondary | ICD-10-CM

## 2022-07-26 DIAGNOSIS — L97521 Non-pressure chronic ulcer of other part of left foot limited to breakdown of skin: Secondary | ICD-10-CM | POA: Diagnosis not present

## 2022-07-26 DIAGNOSIS — S88111A Complete traumatic amputation at level between knee and ankle, right lower leg, initial encounter: Secondary | ICD-10-CM

## 2022-07-29 ENCOUNTER — Encounter: Payer: Self-pay | Admitting: Orthopedic Surgery

## 2022-07-29 NOTE — Progress Notes (Signed)
Office Visit Note   Patient: Gregory May           Date of Birth: [birth year]           MRN: [id number] Visit Date: 07/26/2022              Requested by: Idamae Schuller, MD [address] [place],  [place] [address] PCP: Idamae Schuller, MD  Chief Complaint  Patient presents with   Left Foot - Wound Check      HPI: Patient is a 61 year old gentleman is seen in follow-up for right transtibial amputation and ulceration Wagner grade 1 plantar aspect the left foot.  Assessment & Plan: Visit Diagnoses:  1. Below-knee amputation of right lower extremity ([employer])   2. Ulcer of toe of left foot, limited to breakdown of skin ([place])     Plan: Ulcer was debrided patient has a prescription for [employer] for new prosthetic evaluation.  He will make the follow-up appointment.  Follow-Up Instructions: Return in about 4 weeks (around 08/23/2022).   Ortho Exam  Patient is alert, oriented, no adenopathy, well-dressed, normal affect, normal respiratory effort. Examination patient has a small superficial ulcer on the right residual limb transtibial amputation.  Patient does have a prescription for [employer].  Examination of the left foot patient has an ulcer on the plantar aspect the left heel and fourth toe.  After informed consent the ulcer was debrided of skin and soft tissue back to healthy viable tissue there is no abscess no undermining.  Ulcer is 2 cm in diameter 2 mm deep after debridement.  Most recent hemoglobin A1c 7.2.  Imaging: No results found. No images are attached to the encounter.  Labs: Lab Results  Component Value Date   HGBA1C 7.2 (H) 03/08/2022   HGBA1C 7.2 (H) 11/29/2021   HGBA1C 6.8 (H) 11/16/2021   ESRSEDRATE 11 11/15/2021   ESRSEDRATE 25 (H) 08/19/2021   ESRSEDRATE 8 07/15/2019   CRP 0.7 11/17/2021   CRP <0.5 11/15/2021   CRP 1.7 (H) 08/19/2021   REPTSTATUS 08/26/2021 FINAL 08/21/2021   GRAMSTAIN  08/21/2021    WBC PRESENT,BOTH PMN AND MONONUCLEAR NO ORGANISMS  SEEN    CULT  08/21/2021    No growth aerobically or anaerobically. Performed at [facility], [place] [address] [place],  [address]    LABORGA STAPHYLOCOCCUS HOMINIS 08/21/2021     Lab Results  Component Value Date   ALBUMIN 4.2 03/08/2022   ALBUMIN 4.6 11/29/2021   ALBUMIN 3.8 11/15/2021    Lab Results  Component Value Date   MG 1.9 11/15/2021   Lab Results  Component Value Date   VD25OH 16 (L) 11/05/2013    No results found for: "PREALBUMIN"    Latest Ref Rng & Units 02/19/2022   12:40 PM 01/17/2022    3:43 PM 11/16/2021    3:40 AM  CBC EXTENDED  WBC 3.4 - 10.8 x10E3/uL 8.3  8.4  8.2   RBC 4.14 - 5.80 x10E6/uL 4.82  4.88  4.82   Hemoglobin 13.0 - 17.7 g/dL 15.8  15.2  15.3   HCT 37.5 - 51.0 % 45.5  45.9  46.2   Platelets 150 - 450 x10E3/uL 291  297  257      There is no height or weight on file to calculate BMI.  Orders:  No orders of the defined types were placed in this encounter.  No orders of the defined types were placed in this encounter.    Procedures: No  procedures performed  Clinical Data: No additional findings.  ROS:  All other systems negative, except as noted in the HPI. Review of Systems  Objective: Vital Signs: There were no vitals taken for this visit.  Specialty Comments:  No specialty comments available.  PMFS History: Patient Active Problem List   Diagnosis Date Noted   PFO with atrial septal aneurysm    Long term (current) use of anticoagulants 02/26/2022   Right retinal embolus 01/15/2022   DM type 2 with diabetic peripheral neuropathy (HCC) 01/15/2022   Obstructive sleep apnea 01/15/2022   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   Transportation insecurity 11/23/2021   Retinal artery occlusion, central 11/15/2021   Epidermal inclusion cyst 10/18/2021   Acute postoperative pain 09/05/2021   Amputated finger 09/05/2021   Osteomyelitis of finger of right hand (HCC) 08/19/2021   Cellulitis of finger of right  hand 08/17/2021   Osteomyelitis of second toe of left foot (HCC)    Hepatic steatosis 06/24/2020   Back pain 04/20/2020   Flu vaccine need 04/20/2020   COVID-19 virus vaccination declined 04/20/2020   Left knee pain 04/20/2020   Allergic sinusitis 04/20/2020   Elevated liver enzymes 04/20/2020   Diastasis of rectus abdominis 07/16/2019   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 10/07/2018   Essential hypertension 06/19/2018   Peripheral neuropathy 12/19/2017   Acquired absence of right leg below knee (HCC) 04/17/2017   Carpal tunnel syndrome 03/18/2017   Colon cancer screening 06/21/2015   Status post below knee amputation of right lower extremity (HCC) 06/07/2015   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) 06/01/2015   Diabetic polyneuropathy associated with type 2 diabetes mellitus (HCC) 12/09/2014   Diabetes mellitus with carpal tunnel syndrome (HCC) 12/09/2014   DJD (degenerative joint disease) of knee 07/21/2014   Osteoarthritis, hip, bilateral 05/25/2014   Onychomycosis 10/14/2013   Pure hypercholesterolemia 09/25/2013   Diabetic foot ulcer (HCC) 09/15/2013   Poorly controlled type 2 diabetes mellitus with complication (HCC) 09/15/1997   Past Medical History:  Diagnosis Date   Arthritis    bilateral hips   Cutaneous abscess of left foot    Deep vein thrombosis (DVT) (HCC)    Diabetes mellitus    type II   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 Date of diagnosis February 08 2014 Duration of anticoagulation: 6 months. End date 08/12/2014.  Anticoagulant: Lovenox 120 units daily Switched to Eliquis on 05/25/2014      Ear drum perforation, right 03/06/2019   Non-pressure chronic ulcer of right calf, limited to breakdown of skin (HCC) 04/17/2017   Pulmonary emboli (HCC) 02/08/2014   Date of diagnosis February 08 2014, on chest CTA  Hospitalized for 3 days Had some symptoms of shortness of breath, and chest pain With intercurrent DVT of the left LE. Duration of anticoagulation: 8 months. End date 10/11/2014.  Anticoagulant: Lovenox 120 units daily Switched to Eliquis on 05/25/2014 per patient preference    Pulmonary embolism (HCC)    Sebaceous cyst    on back of neck    Family History  Problem Relation Age of Onset   Breast cancer Mother    Cancer Mother        small intestine   Liver cancer Mother    Diabetes Mother    Diabetes Father    Diabetes Brother  Hypertension Maternal Grandmother    Heart Problems Maternal Grandmother    Diabetes Paternal Grandmother    Diabetes Paternal Grandfather    Diabetes Brother     Past Surgical History:  Procedure Laterality Date   AMPUTATION Right 06/03/2015   Procedure: Right Below Knee Amputation;  Surgeon: [name], MD;  Location: [place];  Service: Orthopedics;  Laterality: Right;   AMPUTATION Left 07/24/2019   Procedure: LEFT FOOT 3RD RAY AMPUTATION;  Surgeon: [name], MD;  Location: [place];  Service: Orthopedics;  Laterality: Left;   AMPUTATION Left 06/23/2021   Procedure: LEFT 2ND TOE AMPUTATION;  Surgeon: [name], MD;  Location: [place];  Service: Orthopedics;  Laterality: Left;   AMPUTATION Right 08/21/2021   Procedure: AMPUTATION RIGHT INDEX FINGER;  Surgeon: Orene Desanctis, MD;  Location: [facility];  Service: Orthopedics;  Laterality: Right;   BUBBLE STUDY  01/24/2022   Procedure: BUBBLE STUDY;  Surgeon: Josue Hector, MD;  Location: [facility] [facility];  Service: Cardiovascular;;   CLOSED REDUCTION WITH HUMER PIN INSERTION  1974   left hip   HARDWARE REMOVAL Left 07/21/2014   Procedure: HARDWARE REMOVAL;  Surgeon: Ninetta Lights, MD;  Location: [place];  Service: Orthopedics;  Laterality: Left;   I & D EXTREMITY Right 08/21/2021   Procedure: IRRIGATION AND DEBRIDEMENT RIGHT INDEX FINGER;  Surgeon: Orene Desanctis, MD;  Location: [facility];  Service: Orthopedics;   Laterality: Right;   PATENT FORAMEN OVALE(PFO) CLOSURE N/A 02/28/2022   Procedure: PATENT FORAMEN OVALE(PFO) CLOSURE;  Surgeon: Sherren Mocha, MD;  Location: [facility];  Service: Cardiovascular;  Laterality: N/A;   ROTATOR CUFF REPAIR Right 2005 (approx)   TEE WITHOUT CARDIOVERSION N/A 01/24/2022   Procedure: TRANSESOPHAGEAL ECHOCARDIOGRAM (TEE);  Surgeon: Josue Hector, MD;  [facility];  Service: Cardiovascular;  Laterality: N/A;   TOTAL HIP ARTHROPLASTY Left 07/21/2014   Procedure: TOTAL HIP ARTHROPLASTY ANTERIOR APPROACH;  Surgeon: Ninetta Lights, MD;  Location: [place];  Service: Orthopedics;  Laterality: Left;   TOTAL HIP ARTHROPLASTY Right 2006 (approx)   right hip replaced   Social History   Occupational History   Occupation: disabled  Tobacco Use   Smoking status: Never   Smokeless tobacco: Never  Vaping Use   Vaping Use: Never used  Substance and Sexual Activity   Alcohol use: Not Currently    Comment: beer and mixed drink maybe 5  times a month   Drug use: No   Sexual activity: Never

## 2022-07-30 ENCOUNTER — Telehealth: Payer: Self-pay | Admitting: Orthopedic Surgery

## 2022-07-30 NOTE — Telephone Encounter (Signed)
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 He is unable to use his prosthetic want to have someone to call him..[phone]

## 2022-07-30 NOTE — Telephone Encounter (Signed)
If he is having pain with his limb then we need to see him in the office. If you please call and make an appt with Denny Peon I can open a time just let me know what day and when. Thanks.

## 2022-07-30 NOTE — Telephone Encounter (Signed)
I can open up a 9:30 am with Gregory May

## 2022-07-31 ENCOUNTER — Encounter: Payer: Self-pay | Admitting: Family

## 2022-07-31 ENCOUNTER — Ambulatory Visit (INDEPENDENT_AMBULATORY_CARE_PROVIDER_SITE_OTHER): Payer: Medicare HMO | Admitting: Family

## 2022-07-31 DIAGNOSIS — Z89511 Acquired absence of right leg below knee: Secondary | ICD-10-CM

## 2022-07-31 DIAGNOSIS — S88111A Complete traumatic amputation at level between knee and ankle, right lower leg, initial encounter: Secondary | ICD-10-CM

## 2022-07-31 DIAGNOSIS — L03115 Cellulitis of right lower limb: Secondary | ICD-10-CM | POA: Diagnosis not present

## 2022-07-31 MED ORDER — AMOXICILLIN-POT CLAVULANATE 500-125 MG PO TABS: 1.0000 | ORAL_TABLET | Freq: Three times a day (TID) | ORAL | 0 refills | Status: DC

## 2022-07-31 NOTE — Progress Notes (Signed)
Office Visit Note   Patient: Gregory May           Date of Birth: [birth year]           MRN: [id number] Visit Date: 07/31/2022              Requested by: Gwenevere Abbot, MD [address] [place],  [place] [address] PCP: Gwenevere Abbot, MD  No chief complaint on file.     HPI: The patient is a 61 year old gentleman seen status post right transtibial amputation and ulceration wagner grade one plantar aspect left foot. Seen today in follow up for ulcer to right residual limb.  Has an ulcer to the distal tip of his right transtibial amputation.  States that he was helping a friend pushed his car and due to an ill fitting socket rubbed an ulcer.  He was seen in the office and had debridement with Dr. Lajoyce Corners.  Since then he has had persistent swelling increased tenderness and redness  Assessment & Plan: Visit Diagnoses: No diagnosis found.  Plan: Placed on a course of Augmentin for cellulitis discussing portance of return precautions he will call for any worsening or ascending cellulitis.  Follow-Up Instructions: No follow-ups on file.   Ortho Exam  Patient is alert, oriented, no adenopathy, well-dressed, normal affect, normal respiratory effort. On examination of the right residual limb he has ulcer to the distal tip the open ulcer is 5 mm in diameter 2 mm deep with necrotic tissue in the wound bed this does not probe there is surrounding edema there is no fluctuance edema is about the size of a plum  Imaging: No results found. No images are attached to the encounter.  Labs: Lab Results  Component Value Date   HGBA1C 7.2 (H) 03/08/2022   HGBA1C 7.2 (H) 11/29/2021   HGBA1C 6.8 (H) 11/16/2021   ESRSEDRATE 11 11/15/2021   ESRSEDRATE 25 (H) 08/19/2021   ESRSEDRATE 8 07/15/2019   CRP 0.7 11/17/2021   CRP <0.5 11/15/2021   CRP 1.7 (H) 08/19/2021   REPTSTATUS 08/26/2021 FINAL 08/21/2021   GRAMSTAIN  08/21/2021    WBC PRESENT,BOTH PMN AND MONONUCLEAR NO ORGANISMS SEEN     CULT  08/21/2021    No growth aerobically or anaerobically. Performed at [facility], [address], [place], [place] [address]    [facility] STAPHYLOCOCCUS HOMINIS 08/21/2021     Lab Results  Component Value Date   ALBUMIN 4.2 03/08/2022   ALBUMIN 4.6 11/29/2021   ALBUMIN 3.8 11/15/2021    Lab Results  Component Value Date   MG 1.9 11/15/2021   Lab Results  Component Value Date   VD25OH 16 (L) 11/05/2013    No results found for: "PREALBUMIN"    Latest Ref Rng & Units 02/19/2022   12:40 PM 01/17/2022    3:43 PM 11/16/2021    3:40 AM  CBC EXTENDED  WBC 3.4 - 10.8 x10E3/uL 8.3  8.4  8.2   RBC 4.14 - 5.80 x10E6/uL 4.82  4.88  4.82   Hemoglobin 13.0 - 17.7 g/dL 44.0  10.2  72.5   HCT 37.5 - 51.0 % 45.5  45.9  46.2   Platelets 150 - 450 x10E3/uL 291  297  257      There is no height or weight on file to calculate BMI.  Orders:  No orders of the defined types were placed in this encounter.  No orders of the defined types were placed in this encounter.  Procedures: No procedures performed  Clinical Data: No additional findings.  ROS:  All other systems negative, except as noted in the HPI. Review of Systems  Objective: Vital Signs: There were no vitals taken for this visit.  Specialty Comments:  No specialty comments available.  PMFS History: Patient Active Problem List   Diagnosis Date Noted   PFO with atrial septal aneurysm    Long term (current) use of anticoagulants 02/26/2022   Right retinal embolus 01/15/2022   DM type 2 with diabetic peripheral neuropathy (HCC) 01/15/2022   Obstructive sleep apnea 01/15/2022   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   Transportation insecurity 11/23/2021   Retinal artery occlusion, central 11/15/2021   Epidermal inclusion cyst 10/18/2021   Acute postoperative pain 09/05/2021   Amputated finger 09/05/2021   Osteomyelitis of finger of right hand (HCC) 08/19/2021   Cellulitis of finger of right hand  08/17/2021   Osteomyelitis of second toe of left foot (HCC)    Hepatic steatosis 06/24/2020   Back pain 04/20/2020   Flu vaccine need 04/20/2020   COVID-19 virus vaccination declined 04/20/2020   Left knee pain 04/20/2020   Allergic sinusitis 04/20/2020   Elevated liver enzymes 04/20/2020   Diastasis of rectus abdominis 07/16/2019   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 10/07/2018   Essential hypertension 06/19/2018   Peripheral neuropathy 12/19/2017   Acquired absence of right leg below knee (HCC) 04/17/2017   Carpal tunnel syndrome 03/18/2017   Colon cancer screening 06/21/2015   Status post below knee amputation of right lower extremity (HCC) 06/07/2015   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) 06/01/2015   Diabetic polyneuropathy associated with type 2 diabetes mellitus (HCC) 12/09/2014   Diabetes mellitus with carpal tunnel syndrome (HCC) 12/09/2014   DJD (degenerative joint disease) of knee 07/21/2014   Osteoarthritis, hip, bilateral 05/25/2014   Onychomycosis 10/14/2013   Pure hypercholesterolemia 09/25/2013   Diabetic foot ulcer (HCC) 09/15/2013   Poorly controlled type 2 diabetes mellitus with complication (HCC) 09/15/1997   Past Medical History:  Diagnosis Date   Arthritis    bilateral hips   Cutaneous abscess of left foot    Deep vein thrombosis (DVT) (HCC)    Diabetes mellitus    type II   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 Date of diagnosis February 08 2014 Duration of anticoagulation: 6 months. End date 08/12/2014.  Anticoagulant: Lovenox 120 units daily Switched to Eliquis on 05/25/2014      Ear drum perforation, right 03/06/2019   Non-pressure chronic ulcer of right calf, limited to breakdown of skin (HCC) 04/17/2017   Pulmonary emboli (HCC) 02/08/2014   Date of diagnosis February 08 2014, on chest CTA  Hospitalized for 3 days Had some symptoms of shortness of breath, and chest pain With intercurrent DVT of the left LE. Duration of anticoagulation: 8 months. End date 10/11/2014.  Anticoagulant: Lovenox 120 units daily Switched to Eliquis on 05/25/2014 per patient preference    Pulmonary embolism (HCC)    Sebaceous cyst    on back of neck    Family History  Problem Relation Age of Onset   Breast cancer Mother    Cancer Mother        small intestine   Liver cancer Mother    Diabetes Mother    Diabetes Father    Diabetes Brother  Hypertension Maternal Grandmother    Heart Problems Maternal Grandmother    Diabetes Paternal Grandmother    Diabetes Paternal Grandfather    Diabetes Brother     Past Surgical History:  Procedure Laterality Date   AMPUTATION Right 06/03/2015   Procedure: Right Below Knee Amputation;  Surgeon: [name], MD;  Location: [place];  Service: Orthopedics;  Laterality: Right;   AMPUTATION Left 07/24/2019   Procedure: LEFT FOOT 3RD RAY AMPUTATION;  Surgeon: [name], MD;  Location: [place];  Service: Orthopedics;  Laterality: Left;   AMPUTATION Left 06/23/2021   Procedure: LEFT 2ND TOE AMPUTATION;  Surgeon: [name], MD;  Location: [place];  Service: Orthopedics;  Laterality: Left;   AMPUTATION Right 08/21/2021   Procedure: AMPUTATION RIGHT INDEX FINGER;  Surgeon: Orene Desanctis, MD;  Location: [place];  Service: Orthopedics;  Laterality: Right;   BUBBLE STUDY  01/24/2022   Procedure: BUBBLE STUDY;  Surgeon: Josue Hector, MD;  Location: [place];  Service: Cardiovascular;;   CLOSED REDUCTION WITH HUMER PIN INSERTION  1974   left hip   HARDWARE REMOVAL Left 07/21/2014   Procedure: HARDWARE REMOVAL;  Surgeon: Ninetta Lights, MD;  Location: [place];  Service: Orthopedics;  Laterality: Left;   I & D EXTREMITY Right 08/21/2021   Procedure: IRRIGATION AND DEBRIDEMENT RIGHT INDEX FINGER;  Surgeon: Orene Desanctis, MD;  Location: [place];  Service: Orthopedics;   Laterality: Right;   PATENT FORAMEN OVALE(PFO) CLOSURE N/A 02/28/2022   Procedure: PATENT FORAMEN OVALE(PFO) CLOSURE;  Surgeon: Sherren Mocha, MD;  Location: [place] [facility];  Service: Cardiovascular;  Laterality: N/A;   ROTATOR CUFF REPAIR Right 2005 (approx)   TEE WITHOUT CARDIOVERSION N/A 01/24/2022   Procedure: TRANSESOPHAGEAL ECHOCARDIOGRAM (TEE);  Surgeon: Josue Hector, MD;  [facility];  Service: Cardiovascular;  Laterality: N/A;   TOTAL HIP ARTHROPLASTY Left 07/21/2014   Procedure: TOTAL HIP ARTHROPLASTY ANTERIOR APPROACH;  Surgeon: Ninetta Lights, MD;  Location: [place];  Service: Orthopedics;  Laterality: Left;   TOTAL HIP ARTHROPLASTY Right 2006 (approx)   right hip replaced   Social History   Occupational History   Occupation: disabled  Tobacco Use   Smoking status: Never   Smokeless tobacco: Never  Vaping Use   Vaping Use: Never used  Substance and Sexual Activity   Alcohol use: Not Currently    Comment: beer and mixed drink maybe 5  times a month   Drug use: No   Sexual activity: Never

## 2022-08-01 ENCOUNTER — Telehealth: Payer: Self-pay

## 2022-08-01 NOTE — Telephone Encounter (Signed)
Refills for jardiance (for PAP- BI CARES) left in yellow box.

## 2022-08-02 ENCOUNTER — Ambulatory Visit (INDEPENDENT_AMBULATORY_CARE_PROVIDER_SITE_OTHER): Payer: Medicare HMO | Admitting: Pharmacist

## 2022-08-02 DIAGNOSIS — H349 Unspecified retinal vascular occlusion: Secondary | ICD-10-CM | POA: Diagnosis not present

## 2022-08-02 DIAGNOSIS — H3411 Central retinal artery occlusion, right eye: Secondary | ICD-10-CM

## 2022-08-02 DIAGNOSIS — Z7901 Long term (current) use of anticoagulants: Secondary | ICD-10-CM | POA: Diagnosis not present

## 2022-08-02 LAB — POCT INR: INR: 3.5 — AB (ref 2.0–3.0)

## 2022-08-02 NOTE — Progress Notes (Signed)
INTERNAL MEDICINE TEACHING ATTENDING ADDENDUM ° °I agree with pharmacy recommendations as outlined in their note.  ° °-Cythina Mickelsen MD ° °

## 2022-08-02 NOTE — Patient Instructions (Signed)
Patient instructed to take medications as defined in the Anti-coagulation Track section of this encounter.  Patient instructed to take today's dose.  Patient instructed to take 2 (two) of your 5 mg peach-colored warfarin tablets, ALL DAYS OF THE WEEK---EXCEPT on Tuesdays and Wednesdays, take 2 & 1/2 tablets (two-and-one-half) tablets on TUESDAYS and WEDNESDAYS.  Patient verbalized understanding of these instructions.

## 2022-08-02 NOTE — Progress Notes (Signed)
Anticoagulation Management Gregory May is a 61 y.o. male who reports to the clinic for monitoring of warfarin treatment.    Indication:  Retinal Occlusion, Central; Right retinal embolism; long term current use of oral anticoagulant, warfarin. INR target range 2.0 - 3.0.    Duration: indefinite Supervising physician: Erlinda Hong  Anticoagulation Clinic Visit History: Patient does not report signs/symptoms of bleeding or thromboembolism  Other recent changes: NO diet, medications, lifestyle--EXCEPT as noted in patient findings.  Anticoagulation Episode Summary     Current INR goal:  2.0-3.0  TTR:  27.0 % (4.9 mo)  Next INR check:  08/14/2022  INR from last check:  3.5 (08/02/2022)  Weekly max warfarin dose:    Target end date:    INR check location:    Preferred lab:    Send INR reminders to:     Indications   Pulmonary emboli (HCC) (Resolved) [I26.99] DVT (deep venous thrombosis) (HCC) (Resolved) [I82.409] Retinal artery occlusion central [H34.10] Right retinal embolus [H34.9] Long term (current) use of anticoagulants [Z79.01]        Comments:           No Known Allergies  Current Outpatient Medications:    Accu-Chek FastClix Lancets MISC, Use Accu Chek Fastclix lancets to check blood sugar three times daily. DX:E11.65, Disp: 300 each, Rfl: 2   ACCU-CHEK GUIDE test strip, TEST BLOOD SUGAR THREE TIMES DAILY AS DIRECTED, Disp: 300 strip, Rfl: 3   acetaminophen (TYLENOL) 500 MG tablet, Take 500-1,000 mg by mouth at bedtime as needed for mild pain (pain)., Disp: , Rfl:    amoxicillin-clavulanate (AUGMENTIN) 500-125 MG tablet, Take 1 tablet by mouth 3 (three) times daily., Disp: 30 tablet, Rfl: 0   APPLE CIDER VINEGAR PO, Take 1 each by mouth daily. With Ginger, Disp: , Rfl:    empagliflozin (JARDIANCE) 25 MG TABS tablet, Take 1 tablet (25 mg total) by mouth daily., Disp: 90 tablet, Rfl: 3   fluticasone (FLONASE) 50 MCG/ACT nasal spray, USE 2 SPRAY(S) IN EACH  NOSTRIL AT BEDTIME AS NEEDED SINUS  DRAINAGE, Disp: 16 g, Rfl: 0   gabapentin (NEURONTIN) 300 MG capsule, TAKE 1 CAPSULE FOUR TIMES DAILY AS NEEDED (Patient taking differently: Take 600 mg by mouth 2 (two) times daily as needed (pain).), Disp: 360 capsule, Rfl: 0   insulin aspart (NOVOLOG) 100 UNIT/ML injection, Inject 16-20 units twice a day (Max daily dose 40 units) (Patient taking differently: Inject 16-20 Units into the skin 3 (three) times daily with meals. Sliding scale), Disp: 20 mL, Rfl: 0   insulin degludec (TRESIBA FLEXTOUCH) 100 UNIT/ML FlexTouch Pen, Inject up to 12 units at bedtime, Disp: 30 mL, Rfl: 0   Insulin Syringes, Disposable, U-100 0.5 ML MISC, Use to inject insulin, Disp: 100 each, Rfl: 2   metFORMIN (GLUCOPHAGE-XR) 750 MG 24 hr tablet, TAKE 2 TABLETS EVERY DAY, Disp: 180 tablet, Rfl: 2   Multiple Vitamin (MULTIVITAMIN WITH MINERALS) TABS tablet, Take 1 tablet by mouth every morning. Centrum, Disp: , Rfl:    pioglitazone (ACTOS) 30 MG tablet, TAKE 1 TABLET EVERY DAY, Disp: 90 tablet, Rfl: 1   rosuvastatin (CRESTOR) 20 MG tablet, Take 1 tablet (20 mg total) by mouth daily., Disp: 90 tablet, Rfl: 2   warfarin (COUMADIN) 5 MG tablet, Take 2 (two) of your 5 mg peach-colored warfarin tablets on Mondays and Thursdays. All OTHER DAYS, (Sundays, Tuesdays, Wednesdays, Fridays and Saturdays) take two and one-half ( 2 & 1/2 tablets.), Disp: 68 tablet, Rfl: 2 Past Medical  History:  Diagnosis Date   Arthritis    bilateral hips   Cutaneous abscess of left foot    Deep vein thrombosis (DVT) (HCC)    Diabetes mellitus    type II   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 Date of diagnosis February 08 2014 Duration of anticoagulation: 6 months. End date 08/12/2014.  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 Duration of anticoagulation: 8 months. End date 10/11/2014.  Anticoagulant: Lovenox 120 units daily Switched to Eliquis on 05/25/2014 per patient preference    Pulmonary embolism (HCC)    Sebaceous cyst    on back of neck   Social History   Socioeconomic History   Marital status: Single    Spouse name: Not on file   Number of children: 0   Years of education: Not on file   Highest education level: Not on file  Occupational History   Occupation: disabled  Tobacco Use   Smoking status: Never   Smokeless tobacco: Never  Vaping Use   Vaping Use: Never used  Substance and Sexual Activity   Alcohol use: Not Currently    Comment: beer and mixed drink maybe 5  times a month   Drug use: No   Sexual activity: Never  Other Topics Concern   Not on file  Social History Narrative   Not on file   Social Determinants of Health   Financial Resource Strain: Low Risk  (12/15/2021)   Overall Financial Resource Strain (CARDIA)    Difficulty of Paying Living Expenses: Not hard at all  Food Insecurity: No Food Insecurity (03/29/2022)   Hunger Vital Sign    Worried About Running Out of Food in the Last Year: Never true    Ran Out of Food in the Last Year: Never true  Transportation Needs: No Transportation Needs (03/29/2022)   PRAPARE - Administrator, Civil Service (Medical): No    Lack of Transportation (Non-Medical): No  Physical Activity: Inactive (12/15/2021)   Exercise Vital Sign    Days of Exercise per Week: 0 days    Minutes of Exercise per Session: 0 min  Stress: No Stress Concern Present (12/15/2021)   [employer] of Occupational Health - Occupational Stress Questionnaire    Feeling of  Stress : Not at all  Social Connections: Socially Isolated (12/15/2021)   Social Connection and Isolation Panel [NHANES]    Frequency of Communication with Friends and Family: More than three times a week    Frequency of Social Gatherings with Friends and Family: More than three times a week    Attends Religious Services: Never    Database administrator or Organizations: No    Attends Banker Meetings: Never    Marital Status: Never married   Family History  Problem Relation Age of Onset   Breast cancer Mother    Cancer Mother        small intestine   Liver cancer Mother  Diabetes Mother    Diabetes Father    Diabetes Brother    Hypertension Maternal Grandmother    Heart Problems Maternal Grandmother    Diabetes Paternal Grandmother    Diabetes Paternal Grandfather    Diabetes Brother     ASSESSMENT Recent Results: The most recent result is correlated with 82.5 mg per week: Lab Results  Component Value Date   INR 3.5 (A) 08/02/2022   INR 3.0 07/23/2022   INR 4.1 (A) 07/09/2022    Anticoagulation Dosing: Description   Take 2 (two) of your 5 mg peach-colored warfarin tablets, ALL DAYS OF THE WEEK---EXCEPT on Tuesdays and Wednesdays, take 2 & 1/2 tablets (two-and-one-half) tablets on TUESDAYS and WEDNESDAYS.      INR today: Supratherapeutic  PLAN Weekly dose was decreased by 9% to 75 mg per week  Patient Instructions  Patient instructed to take medications as defined in the Anti-coagulation Track section of this encounter.  Patient instructed to take today's dose.  Patient instructed to take 2 (two) of your 5 mg peach-colored warfarin tablets, ALL DAYS OF THE WEEK---EXCEPT on Tuesdays and Wednesdays, take 2 & 1/2 tablets (two-and-one-half) tablets on TUESDAYS and WEDNESDAYS.  Patient verbalized understanding of these instructions.  Patient advised to contact clinic or seek medical attention if signs/symptoms of bleeding or thromboembolism occur.  Patient  verbalized understanding by repeating back information and was advised to contact me if further medication-related questions arise. Patient was also provided an information handout.  Follow-up Return in 12 days (on 08/14/2022) for Follow up INR.  Elicia Lamp, PharmD, CPP  15 minutes spent face-to-face with the patient during the encounter. 50% of time spent on education, including signs/sx bleeding and clotting, as well as food and drug interactions with warfarin. 50% of time was spent on fingerprick POC INR sample collection,processing, results determination, and documentation in [email or web address].

## 2022-08-03 ENCOUNTER — Telehealth: Payer: Self-pay | Admitting: Orthopedic Surgery

## 2022-08-03 NOTE — Telephone Encounter (Signed)
I called and sw pt and advised that is he should have an complications over the weekend red, warmth, temp etc to go to the ER. He should not wear his prosthetic, he should continue with ABX he can wear his shrinker or use an ace bandage for compression. I offered an appt 1/2/204 if he would like to come in sooner that 08/23/2022 and pt declined he said that he will call with any questions or concerns.

## 2022-08-03 NOTE — Telephone Encounter (Signed)
Can you please call. He was just in the office on 07/31/2022 for the same issue. If it is worse then we need to see him in the office and I can open a spot for this morning. Just let me know when he wants to come. Thanks!

## 2022-08-03 NOTE — Telephone Encounter (Signed)
Patient states hi stump is swollen and he want to talk to someone ASAP. Please advise [phone]

## 2022-08-07 ENCOUNTER — Telehealth: Payer: Self-pay

## 2022-08-07 NOTE — Patient Outreach (Signed)
  Care Coordination   08/07/2022 Name: Gregory May MRN: [id number] DOB: Aug 30, 1960   Care Coordination Outreach Attempts:  A second unsuccessful outreach was attempted today to offer the patient with information about available care coordination services as a benefit of their health plan.     Follow Up Plan:  Additional outreach attempts will be made to offer the patient care coordination information and services.   Encounter Outcome:  No Answer   Care Coordination Interventions:  No, not indicated    Bary Leriche, RN, MSN [facility] Care Management Care Management Coordinator Direct Line [phone]

## 2022-08-08 ENCOUNTER — Telehealth: Payer: Self-pay | Admitting: Orthopedic Surgery

## 2022-08-08 NOTE — Telephone Encounter (Signed)
Patient states he is wanting someone to call about his antibiotics he runs out tomorrow. He has questions..[phone]

## 2022-08-08 NOTE — Telephone Encounter (Signed)
SW pt, his leg is feeling better, he says the redness is going away and the swelling is. No pain. His last day of abx is today. He doesn't follow up until 08/23/22. I went ahead and scheduled him to see Dr. Lajoyce Corners Tuesday morning, so he doesn't have to wait another week in case he needs more abx treatment. He will stay out of his prosthetic until he is evaluated.

## 2022-08-14 ENCOUNTER — Encounter: Payer: Self-pay | Admitting: Orthopedic Surgery

## 2022-08-14 ENCOUNTER — Ambulatory Visit (INDEPENDENT_AMBULATORY_CARE_PROVIDER_SITE_OTHER): Payer: Medicare HMO | Admitting: Orthopedic Surgery

## 2022-08-14 ENCOUNTER — Ambulatory Visit (INDEPENDENT_AMBULATORY_CARE_PROVIDER_SITE_OTHER): Payer: Medicare HMO | Admitting: Pharmacist

## 2022-08-14 DIAGNOSIS — H349 Unspecified retinal vascular occlusion: Secondary | ICD-10-CM

## 2022-08-14 DIAGNOSIS — Z7901 Long term (current) use of anticoagulants: Secondary | ICD-10-CM | POA: Diagnosis not present

## 2022-08-14 DIAGNOSIS — Z89511 Acquired absence of right leg below knee: Secondary | ICD-10-CM

## 2022-08-14 DIAGNOSIS — L97521 Non-pressure chronic ulcer of other part of left foot limited to breakdown of skin: Secondary | ICD-10-CM | POA: Diagnosis not present

## 2022-08-14 DIAGNOSIS — S88111A Complete traumatic amputation at level between knee and ankle, right lower leg, initial encounter: Secondary | ICD-10-CM

## 2022-08-14 DIAGNOSIS — H3411 Central retinal artery occlusion, right eye: Secondary | ICD-10-CM | POA: Diagnosis not present

## 2022-08-14 LAB — POCT INR: INR: 2.1 (ref 2.0–3.0)

## 2022-08-14 NOTE — Progress Notes (Signed)
Anticoagulation Management Gregory May is a 62 y.o. male who reports to the clinic for monitoring of warfarin treatment.    Indication:  Central retinal artery occlusion; Right retinal artery embolism, long term current use of oral anticoagulant with warfarin. Target INR range 2.0 - 3.0.    Duration: indefinite Supervising physician:  Velna Ochs, MD  Anticoagulation Clinic Visit History: Patient does not report signs/symptoms of bleeding or thromboembolism  Other recent changes: No diet, medications, lifestyle changes.  Anticoagulation Episode Summary     Current INR goal:  2.0-3.0  TTR:  29.8 % (5.3 mo)  Next INR check:  09/03/2022  INR from last check:  2.1 (08/14/2022)  Weekly max warfarin dose:    Target end date:    INR check location:    Preferred lab:    Send INR reminders to:     Indications   Pulmonary emboli (HCC) (Resolved) [I26.99] DVT (deep venous thrombosis) (HCC) (Resolved) [I82.409] Retinal artery occlusion central [H34.10] Right retinal embolus [H34.9] Long term (current) use of anticoagulants [Z79.01]        Comments:           No Known Allergies  Current Outpatient Medications:    Accu-Chek FastClix Lancets MISC, Use Accu Chek Fastclix lancets to check blood sugar three times daily. DX:E11.65, Disp: 300 each, Rfl: 2   ACCU-CHEK GUIDE test strip, TEST BLOOD SUGAR THREE TIMES DAILY AS DIRECTED, Disp: 300 strip, Rfl: 3   acetaminophen (TYLENOL) 500 MG tablet, Take 500-1,000 mg by mouth at bedtime as needed for mild pain (pain)., Disp: , Rfl:    amoxicillin-clavulanate (AUGMENTIN) 500-125 MG tablet, Take 1 tablet by mouth 3 (three) times daily., Disp: 30 tablet, Rfl: 0   APPLE CIDER VINEGAR PO, Take 1 each by mouth daily. With Ginger, Disp: , Rfl:    empagliflozin (JARDIANCE) 25 MG TABS tablet, Take 1 tablet (25 mg total) by mouth daily., Disp: 90 tablet, Rfl: 3   fluticasone (FLONASE) 50 MCG/ACT nasal spray, USE 2 SPRAY(S) IN EACH NOSTRIL AT  BEDTIME AS NEEDED SINUS  DRAINAGE, Disp: 16 g, Rfl: 0   gabapentin (NEURONTIN) 300 MG capsule, TAKE 1 CAPSULE FOUR TIMES DAILY AS NEEDED (Patient taking differently: Take 600 mg by mouth 2 (two) times daily as needed (pain).), Disp: 360 capsule, Rfl: 0   insulin aspart (NOVOLOG) 100 UNIT/ML injection, Inject 16-20 units twice a day (Max daily dose 40 units) (Patient taking differently: Inject 16-20 Units into the skin 3 (three) times daily with meals. Sliding scale), Disp: 20 mL, Rfl: 0   insulin degludec (TRESIBA FLEXTOUCH) 100 UNIT/ML FlexTouch Pen, Inject up to 12 units at bedtime, Disp: 30 mL, Rfl: 0   Insulin Syringes, Disposable, U-100 0.5 ML MISC, Use to inject insulin, Disp: 100 each, Rfl: 2   metFORMIN (GLUCOPHAGE-XR) 750 MG 24 hr tablet, TAKE 2 TABLETS EVERY DAY, Disp: 180 tablet, Rfl: 2   Multiple Vitamin (MULTIVITAMIN WITH MINERALS) TABS tablet, Take 1 tablet by mouth every morning. Centrum, Disp: , Rfl:    pioglitazone (ACTOS) 30 MG tablet, TAKE 1 TABLET EVERY DAY, Disp: 90 tablet, Rfl: 1   rosuvastatin (CRESTOR) 20 MG tablet, Take 1 tablet (20 mg total) by mouth daily., Disp: 90 tablet, Rfl: 2   warfarin (COUMADIN) 5 MG tablet, Take 2 (two) of your 5 mg peach-colored warfarin tablets on Mondays and Thursdays. All OTHER DAYS, (Sundays, Tuesdays, Wednesdays, Fridays and Saturdays) take two and one-half ( 2 & 1/2 tablets.), Disp: 68 tablet, Rfl: 2 Past  Medical History:  Diagnosis Date   Arthritis    bilateral hips   Cutaneous abscess of left foot    Deep vein thrombosis (DVT) (HCC)    Diabetes mellitus    type II   Diabetic ulcer of heel (HCC)    Right heel   DJD (degenerative joint disease)    DVT (deep venous thrombosis) ([place]) 02/08/2014   Proximal provoked. Date of diagnosis February 08 2014 Duration of anticoagulation: 6 months. End date 08/12/2014.  Anticoagulant: Lovenox 120 units daily Switched to Eliquis on 05/25/2014      Ear drum perforation, right 03/06/2019   Non-pressure  chronic ulcer of right calf, limited to breakdown of skin ([place]) 04/17/2017   Pulmonary emboli ([place]) 02/08/2014   Date of diagnosis February 08 2014, on chest CTA Hospitalized for 3 days Had some symptoms of shortness of breath, and chest pain With intercurrent DVT of the left LE. Duration of anticoagulation: 8 months. End date 10/11/2014.  Anticoagulant: Lovenox 120 units daily Switched to Eliquis on 05/25/2014 per patient preference    Pulmonary embolism ([place])    Sebaceous cyst    on back of neck   Social History   Socioeconomic History   Marital status: Single    Spouse name: Not on file   Number of children: 0   Years of education: Not on file   Highest education level: Not on file  Occupational History   Occupation: disabled  Tobacco Use   Smoking status: Never   Smokeless tobacco: Never  Vaping Use   Vaping Use: Never used  Substance and Sexual Activity   Alcohol use: Not Currently    Comment: beer and mixed drink maybe 5  times a month   Drug use: No   Sexual activity: Never  Other Topics Concern   Not on file  Social History Narrative   Not on file   Social Determinants of Health   Financial Resource Strain: Low Risk  (12/15/2021)   Overall Financial Resource Strain (CARDIA)    Difficulty of Paying Living Expenses: Not hard at all  Food Insecurity: No Food Insecurity (03/29/2022)   Hunger Vital Sign    Worried About Running Out of Food in the Last Year: Never true    Ran Out of Food in the Last Year: Never true  Transportation Needs: No Transportation Needs (03/29/2022)   PRAPARE - Hydrologist (Medical): No    Lack of Transportation (Non-Medical): No  Physical Activity: Inactive (12/15/2021)   Exercise Vital Sign    Days of Exercise per Week: 0 days    Minutes of Exercise per Session: 0 min  Stress: No Stress Concern Present (12/15/2021)   [place]    Feeling of Stress : Not at  all  Social Connections: Socially Isolated (12/15/2021)   Social Connection and Isolation Panel [NHANES]    Frequency of Communication with Friends and Family: More than three times a week    Frequency of Social Gatherings with Friends and Family: More than three times a week    Attends Religious Services: Never    Marine scientist or Organizations: No    Attends Archivist Meetings: Never    Marital Status: Never married   Family History  Problem Relation Age of Onset   Breast cancer Mother    Cancer Mother        small intestine   Liver cancer Mother  Diabetes Mother    Diabetes Father    Diabetes Brother    Hypertension Maternal Grandmother    Heart Problems Maternal Grandmother    Diabetes Paternal Grandmother    Diabetes Paternal Grandfather    Diabetes Brother     ASSESSMENT Recent Results: The most recent result is correlated with 75 mg per week: Lab Results  Component Value Date   INR 2.1 08/14/2022   INR 3.5 (A) 08/02/2022   INR 3.0 07/23/2022    Anticoagulation Dosing: Description   Take 2 (two) of your 5 mg peach-colored warfarin tablets, on MONDAYS, [place]. ALL other days, take 2 & 1/2 (two-and-one-half) of your 5 mg peach-colored warfarin tablets.      INR today: Therapeutic  PLAN Weekly dose was increased by 7% to 80 mg per week  Patient Instructions  Patient instructed to take medications as defined in the Anti-coagulation Track section of this encounter.  Patient instructed to take today's dose.  Patient instructed to take 2 (two) of your 5 mg peach-colored warfarin tablets, on MONDAYS, [place]. ALL other days, take 2 & 1/2 (two-and-one-half) of your 5 mg peach-colored warfarin tablets.  Patient verbalized understanding of these instructions.  Patient advised to contact clinic or seek medical attention if signs/symptoms of bleeding or thromboembolism occur.  Patient verbalized understanding by  repeating back information and was advised to contact me if further medication-related questions arise. Patient was also provided an information handout.  Follow-up Return in about 20 days (around 09/03/2022) for Follow up INR.  Pennie Banter, PharmD, CPP  15 minutes spent face-to-face with the patient during the encounter. 50% of time spent on education, including signs/sx bleeding and clotting, as well as food and drug interactions with warfarin. 50% of time was spent on fingerprick POC INR sample collection,processing, results determination, and documentation in http://www.kim.net/.

## 2022-08-14 NOTE — Patient Instructions (Signed)
Patient instructed to take medications as defined in the Anti-coagulation Track section of this encounter.  Patient instructed to take today's dose.  Patient instructed to take 2 (two) of your 5 mg peach-colored warfarin tablets, on MONDAYS, WEDNESDAYS AND FRIDAYS. ALL other days, take 2 & 1/2 (two-and-one-half) of your 5 mg peach-colored warfarin tablets.  Patient verbalized understanding of these instructions.  

## 2022-08-14 NOTE — Progress Notes (Signed)
Office Visit Note   Patient: Gregory May           Date of Birth: 02/15/1961           MRN: [id number] Visit Date: 08/14/2022              Requested by: Idamae Schuller, MD [address] [place],  [place] [address] PCP: Idamae Schuller, MD  Chief Complaint  Patient presents with   Right Leg - Wound Check    Hx BKA      HPI: Patient is a 62 year old gentleman who presents for 3 separate issues.  Patient states he was helping someone push a car and developing swelling redness and ulceration of the right transtibial amputation and ulcer on the left heel and ulceration to the third toe left foot.  Patient is currently on crutches without wearing the prosthesis on the right.  Patient has completed his course of antibiotics.  Assessment & Plan: Visit Diagnoses:  1. Below-knee amputation of right lower extremity ([place])   2. Ulcer of toe of left foot, limited to breakdown of skin ([place])     Plan: Continue with not using a prosthesis on the right until all the swelling has resolved.  Continue with the stump shrinker on the right.  Weightbearing as tolerated on the left.  Minimize activities.  Follow-Up Instructions: Return in about 3 weeks (around 09/04/2022).   Ortho Exam  Patient is alert, oriented, no adenopathy, well-dressed, normal affect, normal respiratory effort. Examination of the left foot patient has ulceration of the third toe status post second toe amputation.  After informed consent the ulcerative tissue was debrided nails were trimmed there is no deep ulceration the ulcers appear superficial.  Patient has a large ulcer on the left heel.  On after informed consent a 10 blade knife was used to debride the skin and soft tissue back to healthy viable granulation tissue.  The ulcer is 2 cm in diameter 1 mm deep.  Examination of the right transtibial amputation the swelling is resolving there are no open ulcers there is no cellulitis no tenderness to palpation.  Imaging: No  results found. No images are attached to the encounter.  Labs: Lab Results  Component Value Date   HGBA1C 7.2 (H) 03/08/2022   HGBA1C 7.2 (H) 11/29/2021   HGBA1C 6.8 (H) 11/16/2021   ESRSEDRATE 11 11/15/2021   ESRSEDRATE 25 (H) 08/19/2021   ESRSEDRATE 8 07/15/2019   CRP 0.7 11/17/2021   CRP <0.5 11/15/2021   CRP 1.7 (H) 08/19/2021   REPTSTATUS 08/26/2021 FINAL 08/21/2021   GRAMSTAIN  08/21/2021    WBC PRESENT,BOTH PMN AND MONONUCLEAR NO ORGANISMS SEEN    CULT  08/21/2021    No growth aerobically or anaerobically. Performed at [facility], [place] [address] [place], [place] [address]    LABORGA STAPHYLOCOCCUS HOMINIS 08/21/2021     Lab Results  Component Value Date   ALBUMIN 4.2 03/08/2022   ALBUMIN 4.6 11/29/2021   ALBUMIN 3.8 11/15/2021    Lab Results  Component Value Date   MG 1.9 11/15/2021   Lab Results  Component Value Date   VD25OH 16 (L) 11/05/2013    No results found for: "PREALBUMIN"    Latest Ref Rng & Units 02/19/2022   12:40 PM 01/17/2022    3:43 PM 11/16/2021    3:40 AM  CBC EXTENDED  WBC 3.4 - 10.8 x10E3/uL 8.3  8.4  8.2   RBC 4.14 - 5.80 x10E6/uL 4.82  4.88  4.82   Hemoglobin 13.0 - 17.7 g/dL 15.8  15.2  15.3   HCT 37.5 - 51.0 % 45.5  45.9  46.2   Platelets 150 - 450 x10E3/uL 291  297  257      There is no height or weight on file to calculate BMI.  Orders:  No orders of the defined types were placed in this encounter.  No orders of the defined types were placed in this encounter.    Procedures: No procedures performed  Clinical Data: No additional findings.  ROS:  All other systems negative, except as noted in the HPI. Review of Systems  Objective: Vital Signs: There were no vitals taken for this visit.  Specialty Comments:  No specialty comments available.  PMFS History: Patient Active Problem List   Diagnosis Date Noted   PFO with atrial septal aneurysm    Long term (current) use of anticoagulants 02/26/2022    Right retinal embolus 01/15/2022   DM type 2 with diabetic peripheral neuropathy ([place]) 01/15/2022   Obstructive sleep apnea 01/15/2022   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   Transportation insecurity 11/23/2021   Retinal artery occlusion, central 11/15/2021   Epidermal inclusion cyst 10/18/2021   Acute postoperative pain 09/05/2021   Amputated finger 09/05/2021   Osteomyelitis of finger of right hand ([place]) 08/19/2021   Cellulitis of finger of right hand 08/17/2021   Osteomyelitis of second toe of left foot ([place])    Hepatic steatosis 06/24/2020   Back pain 04/20/2020   Flu vaccine need 04/20/2020   COVID-19 virus vaccination declined 04/20/2020   Left knee pain 04/20/2020   Allergic sinusitis 04/20/2020   Elevated liver enzymes 04/20/2020   Diastasis of rectus abdominis 07/16/2019   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 10/07/2018   Essential hypertension 06/19/2018   Peripheral neuropathy 12/19/2017   Acquired absence of right leg below knee ([place]) 04/17/2017   Carpal tunnel syndrome 03/18/2017   Colon cancer screening 06/21/2015   Status post below knee amputation of right lower extremity ([place]) 06/07/2015   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 ([place]) 06/01/2015   Diabetic polyneuropathy associated with type 2 diabetes mellitus ([place]) 12/09/2014   Diabetes mellitus with carpal tunnel syndrome ([place]) 12/09/2014   DJD (degenerative joint disease) of knee 07/21/2014   Osteoarthritis, hip, bilateral 05/25/2014   Onychomycosis 10/14/2013   Pure hypercholesterolemia 09/25/2013   Diabetic foot ulcer ([place]) 09/15/2013   Poorly controlled type 2 diabetes mellitus with complication ([place]) [id number]   Past Medical History:  Diagnosis Date   Arthritis    bilateral hips   Cutaneous abscess of left foot    Deep vein thrombosis (DVT) (HCC)    Diabetes mellitus    type II   Diabetic ulcer of heel (HCC)    Right heel   DJD (degenerative joint  disease)    DVT (deep venous thrombosis) ([place]) 02/08/2014   Proximal provoked. Date of diagnosis February 08 2014 Duration of anticoagulation: 6 months. End date 08/12/2014.  Anticoagulant: Lovenox 120 units daily Switched to Eliquis on 05/25/2014      Ear drum perforation, right 03/06/2019   Non-pressure chronic ulcer of right calf, limited to breakdown of skin ([place]) 04/17/2017   Pulmonary emboli ([place]) 02/08/2014   Date of diagnosis February 08 2014, on chest CTA Hospitalized for 3 days Had some symptoms of shortness of breath, and chest pain With intercurrent DVT of the left LE. Duration of anticoagulation: 8 months. End date  10/11/2014.  Anticoagulant: Lovenox 120 units daily Switched to Eliquis on 05/25/2014 per patient preference    Pulmonary embolism ([place])    Sebaceous cyst    on back of neck    Family History  Problem Relation Age of Onset   Breast cancer Mother    Cancer Mother        small intestine   Liver cancer Mother    Diabetes Mother    Diabetes Father    Diabetes Brother    Hypertension Maternal Grandmother    Heart Problems Maternal Grandmother    Diabetes Paternal Grandmother    Diabetes Paternal Grandfather    Diabetes Brother     Past Surgical History:  Procedure Laterality Date   AMPUTATION Right 06/03/2015   Procedure: Right Below Knee Amputation;  Surgeon: [name], MD;  Location: [facility];  Service: Orthopedics;  Laterality: Right;   AMPUTATION Left 07/24/2019   Procedure: LEFT FOOT 3RD RAY AMPUTATION;  Surgeon: [name], MD;  Location: [facility];  Service: Orthopedics;  Laterality: Left;   AMPUTATION Left 06/23/2021   Procedure: LEFT 2ND TOE AMPUTATION;  Surgeon: [name], MD;  Location: [facility];  Service: Orthopedics;  Laterality: Left;   AMPUTATION Right 08/21/2021   Procedure: AMPUTATION RIGHT INDEX FINGER;  Surgeon: Orene Desanctis, MD;  Location: [facility];  Service: Orthopedics;  Laterality: Right;   BUBBLE STUDY  01/24/2022   Procedure: BUBBLE STUDY;  Surgeon:  Josue Hector, MD;  Location: [facility];  Service: Cardiovascular;;   CLOSED REDUCTION WITH HUMER PIN INSERTION  1974   left hip   HARDWARE REMOVAL Left 07/21/2014   Procedure: HARDWARE REMOVAL;  Surgeon: Ninetta Lights, MD;  Location: [facility];  Service: Orthopedics;  Laterality: Left;   I & D EXTREMITY Right 08/21/2021   Procedure: IRRIGATION AND DEBRIDEMENT RIGHT INDEX FINGER;  Surgeon: Orene Desanctis, MD;  Location: [facility];  Service: Orthopedics;  Laterality: Right;   PATENT FORAMEN OVALE(PFO) CLOSURE N/A 02/28/2022   Procedure: PATENT FORAMEN OVALE(PFO) CLOSURE;  Surgeon: Sherren Mocha, MD;  Location: [facility];  Service: Cardiovascular;  Laterality: N/A;   ROTATOR CUFF REPAIR Right 2005 (approx)   TEE WITHOUT CARDIOVERSION N/A 01/24/2022   Procedure: TRANSESOPHAGEAL ECHOCARDIOGRAM (TEE);  Surgeon: Josue Hector, MD;  [facility] ENDOS[facility];  Service: Cardiovascular;  Laterality: N/A;   TOTAL HIP ARTHROPLASTY Left 07/21/2014   Procedure: TOTAL HIP ARTHROPLASTY ANTERIOR APPROACH;  Surgeon: Ninetta Lights, MD;  Location: [facility];  Service: Orthopedics;  Laterality: Left;   TOTAL HIP ARTHROPLASTY Right 2006 (approx)   right hip replaced   Social History   Occupational History   Occupation: disabled  Tobacco Use   Smoking status: Never   Smokeless tobacco: Never  Vaping Use   Vaping Use: Never used  Substance and Sexual Activity   Alcohol use: Not Currently    Comment: beer and mixed drink maybe 5  times a month   Drug use: No   Sexual activity: Never

## 2022-08-15 ENCOUNTER — Other Ambulatory Visit: Payer: Self-pay | Admitting: Internal Medicine

## 2022-08-15 DIAGNOSIS — J309 Allergic rhinitis, unspecified: Secondary | ICD-10-CM

## 2022-08-21 ENCOUNTER — Ambulatory Visit: Payer: Self-pay

## 2022-08-21 NOTE — Patient Outreach (Signed)
  Care Coordination   08/21/2022 Name: Gregory May MRN: [id number] DOB: [birth year]   Care Coordination Outreach Attempts:  An unsuccessful telephone outreach was attempted today to offer the patient information about available care coordination services as a benefit of their health plan.   Follow Up Plan:  Additional outreach attempts will be made to offer the patient care coordination information and services.   Encounter Outcome:  No Answer   Care Coordination Interventions:  No, not indicated    Jone Baseman, RN, MSN [place] Management Care Management Coordinator Direct Line [phone]

## 2022-08-22 ENCOUNTER — Ambulatory Visit: Payer: Medicare HMO | Admitting: Family

## 2022-08-23 ENCOUNTER — Ambulatory Visit: Payer: Medicare HMO | Admitting: Orthopedic Surgery

## 2022-09-03 ENCOUNTER — Telehealth: Payer: Self-pay

## 2022-09-03 ENCOUNTER — Ambulatory Visit: Payer: Medicare HMO

## 2022-09-03 NOTE — Telephone Encounter (Signed)
Returned call to patient. He is coming tomorrow ~ 1430 to see Dr. Elie Confer. Explained we do have samples of Jardiance 25 mg. He will bring his PAP with accompanying document tomorrow. Will need to make copies for him to give to [facility].

## 2022-09-03 NOTE — Telephone Encounter (Signed)
Pt  is requesting a call back .Marland Kitchen He state that he has an out of  his jardiance  and  he has called camille  but sh has not returned his  call as of yet   ... He is coming in tomorrow to Dr Elie Confer  and he is wondering if we have any samples  that he can get one due to he is completely out

## 2022-09-04 ENCOUNTER — Ambulatory Visit (INDEPENDENT_AMBULATORY_CARE_PROVIDER_SITE_OTHER): Payer: Medicare HMO | Admitting: Orthopedic Surgery

## 2022-09-04 ENCOUNTER — Ambulatory Visit: Payer: Medicare HMO

## 2022-09-04 DIAGNOSIS — S88111A Complete traumatic amputation at level between knee and ankle, right lower leg, initial encounter: Secondary | ICD-10-CM

## 2022-09-04 DIAGNOSIS — Z89511 Acquired absence of right leg below knee: Secondary | ICD-10-CM | POA: Diagnosis not present

## 2022-09-04 DIAGNOSIS — Z7901 Long term (current) use of anticoagulants: Secondary | ICD-10-CM

## 2022-09-04 DIAGNOSIS — L97421 Non-pressure chronic ulcer of left heel and midfoot limited to breakdown of skin: Secondary | ICD-10-CM

## 2022-09-04 LAB — POCT INR: INR: 2.3 (ref 2.0–3.0)

## 2022-09-04 NOTE — Telephone Encounter (Signed)
Patient in clinic for INR. 3 boxes (21 tabs total) of Jardiance 25 mg handed to patient by Dr. Coy Saunas. Copy made of patient's SS Benefit Amount and placed in Camille's box along with the signed application for [place] PAP.

## 2022-09-04 NOTE — Telephone Encounter (Signed)
Returned patients call.  Pt bringing [place] re-enrollment paperwork & income documents for Jardiance. We also have Jardiance 25mg  samples available for patient to get when he comes in (if this could be prepared for him). Hopefully his enrollment will be approved within the next 2-3 weeks and a shipment will go out to his home.  Patient is coming by around 2:15-2:45.

## 2022-09-05 ENCOUNTER — Telehealth: Payer: Self-pay | Admitting: Pharmacist

## 2022-09-05 ENCOUNTER — Ambulatory Visit: Payer: Self-pay

## 2022-09-05 ENCOUNTER — Encounter: Payer: Self-pay | Admitting: Orthopedic Surgery

## 2022-09-05 NOTE — Patient Outreach (Signed)
  Care Coordination   09/05/2022 Name: Gregory May MRN: [id number] DOB: 03/04/1961   Care Coordination Outreach Attempts:  An unsuccessful telephone outreach was attempted today to offer the patient information about available care coordination services as a benefit of their health plan.   Follow Up Plan:  Additional outreach attempts will be made to offer the patient care coordination information and services.   Encounter Outcome:  No Answer   Care Coordination Interventions:  No, not indicated    Jone Baseman, RN, MSN [place] Management Care Management Coordinator Direct Line [phone]

## 2022-09-05 NOTE — Telephone Encounter (Signed)
Was provided INR results securely by [facility] in the [facility]. Result = 2.3 (Target range 2.0 - 3.0) on 80 milligrams warfarin/wk. CONTINUED SAME REGIMEN. No bleeding symptoms or signs. RTC on Monday 12FEB24 for next INR.  

## 2022-09-05 NOTE — Telephone Encounter (Signed)
Was provided INR results securely by [facility] in the [facility]. Result = 2.3 (Target range 2.0 - 3.0) on 80 milligrams warfarin/wk. CONTINUED SAME REGIMEN. No bleeding symptoms or signs. RTC on Monday 12FEB24 for next INR.

## 2022-09-05 NOTE — Progress Notes (Signed)
Office Visit Note   Patient: Gregory May           Date of Birth: 07/19/1961           MRN: [id number] Visit Date: 09/04/2022              Requested by: Gwenevere Abbot, MD [address] [place],  [place] [address] PCP: Gwenevere Abbot, MD  Chief Complaint  Patient presents with   Right Leg - Follow-up    HX BKA f/u swelling       HPI: Patient is a 62 year old gentleman is seen in follow-up for right below-knee amputation as well as ulceration to the left heel.  Patient is currently using his stump shrinker.  Patient has been not using his prosthesis till the right leg heals.  Assessment & Plan: Visit Diagnoses:  1. Below-knee amputation of right lower extremity (HCC)   2. Non-pressure chronic ulcer of left heel and midfoot limited to breakdown of skin (HCC)     Plan: Patient will follow-up with [facility] for a new socket.  Patient has callus over the residual limb and decreased residual volume.  Follow-Up Instructions: Return in about 4 weeks (around 10/02/2022).   Ortho Exam  Patient is alert, oriented, no adenopathy, well-dressed, normal affect, normal respiratory effort. Examination of the right below-knee amputation patient has significant decreased volume in the residual limb.  There is callus over the residual limb no open ulcers no fluctuance no cellulitis.  Patient has a small ulcer on the left heel that is 5 mm in diameter 1 mm deep no abscess no tunneling no ischemic changes.  Imaging: No results found. No images are attached to the encounter.  Labs: Lab Results  Component Value Date   HGBA1C 7.2 (H) 03/08/2022   HGBA1C 7.2 (H) 11/29/2021   HGBA1C 6.8 (H) 11/16/2021   ESRSEDRATE 11 11/15/2021   ESRSEDRATE 25 (H) 08/19/2021   ESRSEDRATE 8 07/15/2019   CRP 0.7 11/17/2021   CRP <0.5 11/15/2021   CRP 1.7 (H) 08/19/2021   REPTSTATUS 08/26/2021 FINAL 08/21/2021   GRAMSTAIN  08/21/2021    WBC PRESENT,BOTH PMN AND MONONUCLEAR NO ORGANISMS SEEN    CULT   08/21/2021    No growth aerobically or anaerobically. Performed at [facility], [address] [place], [place] [address]    [facility] STAPHYLOCOCCUS HOMINIS 08/21/2021     Lab Results  Component Value Date   ALBUMIN 4.2 03/08/2022   ALBUMIN 4.6 11/29/2021   ALBUMIN 3.8 11/15/2021    Lab Results  Component Value Date   MG 1.9 11/15/2021   Lab Results  Component Value Date   VD25OH 16 (L) 11/05/2013    No results found for: "PREALBUMIN"    Latest Ref Rng & Units 02/19/2022   12:40 PM 01/17/2022    3:43 PM 11/16/2021    3:40 AM  CBC EXTENDED  WBC 3.4 - 10.8 x10E3/uL 8.3  8.4  8.2   RBC 4.14 - 5.80 x10E6/uL 4.82  4.88  4.82   Hemoglobin 13.0 - 17.7 g/dL 73.7  10.6  26.9   HCT 37.5 - 51.0 % 45.5  45.9  46.2   Platelets 150 - 450 x10E3/uL 291  297  257      There is no height or weight on file to calculate BMI.  Orders:  No orders of the defined types were placed in this encounter.  No orders of the defined types were placed in this encounter.    Procedures:  No procedures performed  Clinical Data: No additional findings.  ROS:  All other systems negative, except as noted in the HPI. Review of Systems  Objective: Vital Signs: There were no vitals taken for this visit.  Specialty Comments:  No specialty comments available.  PMFS History: Patient Active Problem List   Diagnosis Date Noted   PFO with atrial septal aneurysm    Long term (current) use of anticoagulants 02/26/2022   Right retinal embolus 01/15/2022   DM type 2 with diabetic peripheral neuropathy ([place]) 01/15/2022   Obstructive sleep apnea 01/15/2022   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   Transportation insecurity 11/23/2021   Retinal artery occlusion, central 11/15/2021   Epidermal inclusion cyst 10/18/2021   Acute postoperative pain 09/05/2021   Amputated finger 09/05/2021   Osteomyelitis of finger of right hand ([place]) 08/19/2021   Cellulitis of finger of right hand 08/17/2021    Osteomyelitis of second toe of left foot ([place])    Hepatic steatosis 06/24/2020   Back pain 04/20/2020   Flu vaccine need 04/20/2020   COVID-19 virus vaccination declined 04/20/2020   Left knee pain 04/20/2020   Allergic sinusitis 04/20/2020   Elevated liver enzymes 04/20/2020   Diastasis of rectus abdominis 07/16/2019   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 10/07/2018   Essential hypertension 06/19/2018   Peripheral neuropathy 12/19/2017   Acquired absence of right leg below knee ([place]) 04/17/2017   Carpal tunnel syndrome 03/18/2017   Colon cancer screening 06/21/2015   Status post below knee amputation of right lower extremity ([place]) 06/07/2015   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 ([place]) 06/01/2015   Diabetic polyneuropathy associated with type 2 diabetes mellitus ([place]) 12/09/2014   Diabetes mellitus with carpal tunnel syndrome ([place]) 12/09/2014   DJD (degenerative joint disease) of knee 07/21/2014   Osteoarthritis, hip, bilateral 05/25/2014   Onychomycosis 10/14/2013   Pure hypercholesterolemia 09/25/2013   Diabetic foot ulcer ([place]) 09/15/2013   Poorly controlled type 2 diabetes mellitus with complication ([place]) 09/98/3382   Past Medical History:  Diagnosis Date   Arthritis    bilateral hips   Cutaneous abscess of left foot    Deep vein thrombosis (DVT) (HCC)    Diabetes mellitus    type II   Diabetic ulcer of heel (HCC)    Right heel   DJD (degenerative joint disease)    DVT (deep venous thrombosis) ([place]) 02/08/2014   Proximal provoked. Date of diagnosis February 08 2014 Duration of anticoagulation: 6 months. End date 08/12/2014.  Anticoagulant: Lovenox 120 units daily Switched to Eliquis on 05/25/2014      Ear drum perforation, right 03/06/2019   Non-pressure chronic ulcer of right calf, limited to breakdown of skin ([place]) 04/17/2017   Pulmonary emboli ([place]) 02/08/2014   Date of diagnosis February 08 2014, on chest CTA Hospitalized for 3 days  Had some symptoms of shortness of breath, and chest pain With intercurrent DVT of the left LE. Duration of anticoagulation: 8 months. End date 10/11/2014.  Anticoagulant: Lovenox 120 units daily Switched to Eliquis on 05/25/2014 per patient preference    Pulmonary embolism ([place])    Sebaceous cyst    on back of neck    Family History  Problem Relation Age of Onset   Breast cancer Mother    Cancer Mother        small intestine   Liver cancer Mother    Diabetes Mother    Diabetes Father    Diabetes Brother  Hypertension Maternal Grandmother    Heart Problems Maternal Grandmother    Diabetes Paternal Grandmother    Diabetes Paternal Grandfather    Diabetes Brother     Past Surgical History:  Procedure Laterality Date   AMPUTATION Right 06/03/2015   Procedure: Right Below Knee Amputation;  Surgeon: [name], MD;  Location: [place];  Service: Orthopedics;  Laterality: Right;   AMPUTATION Left 07/24/2019   Procedure: LEFT FOOT 3RD RAY AMPUTATION;  Surgeon: [name], MD;  Location: [place];  Service: Orthopedics;  Laterality: Left;   AMPUTATION Left 06/23/2021   Procedure: LEFT 2ND TOE AMPUTATION;  Surgeon: [name], MD;  Location: [place];  Service: Orthopedics;  Laterality: Left;   AMPUTATION Right 08/21/2021   Procedure: AMPUTATION RIGHT INDEX FINGER;  Surgeon: Orene Desanctis, MD;  Location: [place];  Service: Orthopedics;  Laterality: Right;   BUBBLE STUDY  01/24/2022   Procedure: BUBBLE STUDY;  Surgeon: Josue Hector, MD;  Location: [place];  Service: Cardiovascular;;   CLOSED REDUCTION WITH HUMER PIN INSERTION  1974   left hip   HARDWARE REMOVAL Left 07/21/2014   Procedure: HARDWARE REMOVAL;  Surgeon: Ninetta Lights, MD;  Location: [place];  Service: Orthopedics;  Laterality: Left;   I & D EXTREMITY Right 08/21/2021   Procedure: IRRIGATION AND DEBRIDEMENT RIGHT INDEX FINGER;  Surgeon: Orene Desanctis, MD;  Location: [place];  Service: Orthopedics;  Laterality: Right;   PATENT  FORAMEN OVALE(PFO) CLOSURE N/A 02/28/2022   Procedure: PATENT FORAMEN OVALE(PFO) CLOSURE;  Surgeon: Sherren Mocha, MD;  Location: [place] [facility];  Service: Cardiovascular;  Laterality: N/A;   ROTATOR CUFF REPAIR Right 2005 (approx)   TEE WITHOUT CARDIOVERSION N/A 01/24/2022   Procedure: TRANSESOPHAGEAL ECHOCARDIOGRAM (TEE);  Surgeon: Josue Hector, MD;  Location[facility];  Service: Cardiovascular;  Laterality: N/A;   TOTAL HIP ARTHROPLASTY Left 07/21/2014   Procedure: TOTAL HIP ARTHROPLASTY ANTERIOR APPROACH;  Surgeon: Ninetta Lights, MD;  Location: [place];  Service: Orthopedics;  Laterality: Left;   TOTAL HIP ARTHROPLASTY Right 2006 (approx)   right hip replaced   Social History   Occupational History   Occupation: disabled  Tobacco Use   Smoking status: Never   Smokeless tobacco: Never  Vaping Use   Vaping Use: Never used  Substance and Sexual Activity   Alcohol use: Not Currently    Comment: beer and mixed drink maybe 5  times a month   Drug use: No   Sexual activity: Never

## 2022-09-07 NOTE — Telephone Encounter (Signed)
Received notification from [employer] [place] [employer]) regarding approval for [employer]. Patient assistance approved from 09/06/22 to 08/13/23.  MEDICATION WILL SHIP TO PT'S HOME  Phone: [phone]

## 2022-09-19 ENCOUNTER — Ambulatory Visit: Payer: Self-pay

## 2022-09-19 NOTE — Patient Outreach (Signed)
  Care Coordination   09/19/2022 Name: Gregory May MRN: [id number] DOB: [birth year]   Care Coordination Outreach Attempts:  A third unsuccessful outreach was attempted today to offer the patient with information about available care coordination services as a benefit of their health plan.   Follow Up Plan:  No further outreach attempts will be made at this time. We have been unable to contact the patient to offer or enroll patient in care coordination services  Encounter Outcome:  No Answer   Care Coordination Interventions:  No, not indicated    Jone Baseman, RN, MSN [place] [facility] Care Management Coordinator Direct Line [phone]

## 2022-09-23 NOTE — Progress Notes (Deleted)
CC: PCP follow up  HPI:  [name] is a 62 y.o. with medical history of HTN, HLD c/b ischemic stroke, DMII c/b ulcers and s/p right BKA, Hx of DVT and PE on AC presenting to [facility] for a follow up.   Please see problem-based list for further details, assessments, and plans.  Past Medical History:  Diagnosis Date   Arthritis    bilateral hips   Cutaneous abscess of left foot    Deep vein thrombosis (DVT) (HCC)    Diabetes mellitus    type II   Diabetic ulcer of heel (HCC)    Right heel   DJD (degenerative joint disease)    DVT (deep venous thrombosis) ([place]) 02/08/2014   Proximal provoked. Date of diagnosis February 08 2014 Duration of anticoagulation: 6 months. End date 08/12/2014.  Anticoagulant: Lovenox 120 units daily Switched to Eliquis on 05/25/2014      Ear drum perforation, right 03/06/2019   Non-pressure chronic ulcer of right calf, limited to breakdown of skin ([place]) 04/17/2017   Pulmonary emboli ([place]) 02/08/2014   Date of diagnosis February 08 2014, on chest CTA Hospitalized for 3 days Had some symptoms of shortness of breath, and chest pain With intercurrent DVT of the left LE. Duration of anticoagulation: 8 months. End date 10/11/2014.  Anticoagulant: Lovenox 120 units daily Switched to Eliquis on 05/25/2014 per patient preference    Pulmonary embolism (HCC)    Sebaceous cyst    on back of neck    Current Outpatient Medications (Endocrine & Metabolic):    empagliflozin (JARDIANCE) 25 MG TABS tablet, Take 1 tablet (25 mg total) by mouth daily.   insulin aspart (NOVOLOG) 100 UNIT/ML injection, Inject 16-20 units twice a day (Max daily dose 40 units) (Patient taking differently: Inject 16-20 Units into the skin 3 (three) times daily with meals. Sliding scale)   insulin degludec (TRESIBA FLEXTOUCH) 100 UNIT/ML FlexTouch Pen, Inject up to 12 units at bedtime   metFORMIN (GLUCOPHAGE-XR) 750 MG 24 hr tablet, TAKE 2 TABLETS EVERY DAY   pioglitazone (ACTOS) 30 MG tablet, TAKE 1  TABLET EVERY DAY  Current Outpatient Medications (Cardiovascular):    rosuvastatin (CRESTOR) 20 MG tablet, Take 1 tablet (20 mg total) by mouth daily.  Current Outpatient Medications (Respiratory):    fluticasone (FLONASE) 50 MCG/ACT nasal spray, USE 2 SPRAY(S) IN EACH NOSTRIL AT BEDTIME AS NEEDED FOR  SINUS  DRAINAGE  Current Outpatient Medications (Analgesics):    acetaminophen (TYLENOL) 500 MG tablet, Take 500-1,000 mg by mouth at bedtime as needed for mild pain (pain).  Current Outpatient Medications (Hematological):    warfarin (COUMADIN) 5 MG tablet, Take 2 (two) of your 5 mg peach-colored warfarin tablets on Mondays and Thursdays. All OTHER DAYS, (Sundays, Tuesdays, Wednesdays, Fridays and Saturdays) take two and one-half ( 2 & 1/2 tablets.)  Current Outpatient Medications (Other):    Accu-Chek FastClix Lancets MISC, Use Accu Chek Fastclix lancets to check blood sugar three times daily. DX:E11.65   ACCU-CHEK GUIDE test strip, TEST BLOOD SUGAR THREE TIMES DAILY AS DIRECTED   amoxicillin-clavulanate (AUGMENTIN) 500-125 MG tablet, Take 1 tablet by mouth 3 (three) times daily.   APPLE CIDER VINEGAR PO, Take 1 each by mouth daily. With Ginger   gabapentin (NEURONTIN) 300 MG capsule, TAKE 1 CAPSULE FOUR TIMES DAILY AS NEEDED (Patient taking differently: Take 600 mg by mouth 2 (two) times daily as needed (pain).)   Insulin Syringes, Disposable, U-100 0.5 ML MISC, Use to inject insulin   Multiple  Vitamin (MULTIVITAMIN WITH MINERALS) TABS tablet, Take 1 tablet by mouth every morning. Centrum  Review of Systems:  Review of system negative unless stated in the problem list or HPI.    Physical Exam:  There were no vitals filed for this visit.  Physical Exam General: NAD HENT: NCAT Lungs: CTAB, no wheeze, rhonchi or rales.  Cardiovascular: Normal heart sounds, no r/m/g, 2+ pulses in all extremities. No LE edema Abdomen: No TTP, normal bowel sounds MSK: No asymmetry or muscle atrophy.   Skin: no lesions noted on exposed skin Neuro: Alert and oriented x4. CN grossly intact Psych: Normal mood and normal affect   Assessment & Plan:   No problem-specific Assessment & Plan notes found for this encounter.   See Encounters Tab for problem based charting.  Patient discussed with Dr. {NAMES:[id number]::"Guilloud","Hoffman","Mullen","Narendra","Vincent","Machen","Lau","Hatcher"} Idamae Schuller, MD Tillie Rung. [facility] Internal Medicine Residency, PGY-2   HTN Life style controlled.   DMII  02/2022 A1c 7.2  PE on Warfarin Follows Dr. Elie Confer for warfarin clinic. [place] INR normal.

## 2022-09-24 ENCOUNTER — Encounter: Payer: Medicare HMO | Admitting: Internal Medicine

## 2022-09-24 ENCOUNTER — Other Ambulatory Visit (INDEPENDENT_AMBULATORY_CARE_PROVIDER_SITE_OTHER): Payer: Medicare HMO

## 2022-09-24 ENCOUNTER — Ambulatory Visit (INDEPENDENT_AMBULATORY_CARE_PROVIDER_SITE_OTHER): Payer: Medicare HMO | Admitting: Pharmacist

## 2022-09-24 DIAGNOSIS — H349 Unspecified retinal vascular occlusion: Secondary | ICD-10-CM | POA: Diagnosis not present

## 2022-09-24 DIAGNOSIS — Z794 Long term (current) use of insulin: Secondary | ICD-10-CM | POA: Diagnosis not present

## 2022-09-24 DIAGNOSIS — E782 Mixed hyperlipidemia: Secondary | ICD-10-CM

## 2022-09-24 DIAGNOSIS — Z7901 Long term (current) use of anticoagulants: Secondary | ICD-10-CM

## 2022-09-24 DIAGNOSIS — H3411 Central retinal artery occlusion, right eye: Secondary | ICD-10-CM

## 2022-09-24 DIAGNOSIS — E1165 Type 2 diabetes mellitus with hyperglycemia: Secondary | ICD-10-CM

## 2022-09-24 LAB — POCT INR: INR: 2.7 (ref 2.0–3.0)

## 2022-09-24 NOTE — Patient Instructions (Signed)
Patient instructed to take medications as defined in the Anti-coagulation Track section of this encounter.  Patient instructed to take today's dose.  Patient instructed to take 2 (two) of your 5 mg peach-colored warfarin tablets, on MONDAYS, [place]. ALL other days, take 2 & 1/2 (two-and-one-half) of your 5 mg peach-colored warfarin tablets.  Patient verbalized understanding of these instructions.

## 2022-09-24 NOTE — Progress Notes (Signed)
Anticoagulation Management Gregory May is a 62 y.o. male who reports to the clinic for monitoring of warfarin treatment.    Indication: Retinal artery occlusion, central Right retinal embolus; Long term (current) use of anticoagulant warfarin with target range INR 2.0 - 3.0.  Duration: indefinite Supervising physician: Gilles Chiquito, M.D.  Anticoagulation Clinic Visit History: Patient does not report signs/symptoms of bleeding or thromboembolism  Other recent changes: No diet, medications, lifestyle changes cited by the patient.  Anticoagulation Episode Summary     Current INR goal:  2.0-3.0  TTR:  44.2 % (6.7 mo)  Next INR check:  10/22/2022  INR from last check:  2.7 (09/24/2022)  Weekly max warfarin dose:    Target end date:    INR check location:    Preferred lab:    Send INR reminders to:     Indications   Pulmonary emboli (HCC) (Resolved) [I26.99] DVT (deep venous thrombosis) (HCC) (Resolved) [I82.409] Retinal artery occlusion central [H34.10] Right retinal embolus [H34.9] Long term (current) use of anticoagulants [Z79.01]        Comments:           No Known Allergies  Current Outpatient Medications:    Accu-Chek FastClix Lancets MISC, Use Accu Chek Fastclix lancets to check blood sugar three times daily. DX:E11.65, Disp: 300 each, Rfl: 2   ACCU-CHEK GUIDE test strip, TEST BLOOD SUGAR THREE TIMES DAILY AS DIRECTED, Disp: 300 strip, Rfl: 3   acetaminophen (TYLENOL) 500 MG tablet, Take 500-1,000 mg by mouth at bedtime as needed for mild pain (pain)., Disp: , Rfl:    amoxicillin-clavulanate (AUGMENTIN) 500-125 MG tablet, Take 1 tablet by mouth 3 (three) times daily., Disp: 30 tablet, Rfl: 0   APPLE CIDER VINEGAR PO, Take 1 each by mouth daily. With Ginger, Disp: , Rfl:    empagliflozin (JARDIANCE) 25 MG TABS tablet, Take 1 tablet (25 mg total) by mouth daily., Disp: 90 tablet, Rfl: 3   fluticasone (FLONASE) 50 MCG/ACT nasal spray, USE 2 SPRAY(S) IN EACH NOSTRIL  AT BEDTIME AS NEEDED FOR  SINUS  DRAINAGE, Disp: 16 g, Rfl: 0   gabapentin (NEURONTIN) 300 MG capsule, TAKE 1 CAPSULE FOUR TIMES DAILY AS NEEDED (Patient taking differently: Take 600 mg by mouth 2 (two) times daily as needed (pain).), Disp: 360 capsule, Rfl: 0   insulin aspart (NOVOLOG) 100 UNIT/ML injection, Inject 16-20 units twice a day (Max daily dose 40 units) (Patient taking differently: Inject 16-20 Units into the skin 3 (three) times daily with meals. Sliding scale), Disp: 20 mL, Rfl: 0   insulin degludec (TRESIBA FLEXTOUCH) 100 UNIT/ML FlexTouch Pen, Inject up to 12 units at bedtime, Disp: 30 mL, Rfl: 0   Insulin Syringes, Disposable, U-100 0.5 ML MISC, Use to inject insulin, Disp: 100 each, Rfl: 2   metFORMIN (GLUCOPHAGE-XR) 750 MG 24 hr tablet, TAKE 2 TABLETS EVERY DAY, Disp: 180 tablet, Rfl: 2   Multiple Vitamin (MULTIVITAMIN WITH MINERALS) TABS tablet, Take 1 tablet by mouth every morning. Centrum, Disp: , Rfl:    pioglitazone (ACTOS) 30 MG tablet, TAKE 1 TABLET EVERY DAY, Disp: 90 tablet, Rfl: 1   rosuvastatin (CRESTOR) 20 MG tablet, Take 1 tablet (20 mg total) by mouth daily., Disp: 90 tablet, Rfl: 2   warfarin (COUMADIN) 5 MG tablet, Take 2 (two) of your 5 mg peach-colored warfarin tablets on Mondays and Thursdays. All OTHER DAYS, (Sundays, Tuesdays, Wednesdays, Fridays and Saturdays) take two and one-half ( 2 & 1/2 tablets.), Disp: 68 tablet, Rfl: 2 Past  Medical History:  Diagnosis Date   Arthritis    bilateral hips   Cutaneous abscess of left foot    Deep vein thrombosis (DVT) (HCC)    Diabetes mellitus    type II   Diabetic ulcer of heel (HCC)    Right heel   DJD (degenerative joint disease)    DVT (deep venous thrombosis) ([place]) 02/08/2014   Proximal provoked. Date of diagnosis February 08 2014 Duration of anticoagulation: 6 months. End date 08/12/2014.  Anticoagulant: Lovenox 120 units daily Switched to Eliquis on 05/25/2014      Ear drum perforation, right 03/06/2019    Non-pressure chronic ulcer of right calf, limited to breakdown of skin ([place]) 04/17/2017   Pulmonary emboli ([employer]) 02/08/2014   Date of diagnosis February 08 2014, on chest CTA Hospitalized for 3 days Had some symptoms of shortness of breath, and chest pain With intercurrent DVT of the left LE. Duration of anticoagulation: 8 months. End date 10/11/2014.  Anticoagulant: Lovenox 120 units daily Switched to Eliquis on 05/25/2014 per patient preference    Pulmonary embolism ([place])    Sebaceous cyst    on back of neck   Social History   Socioeconomic History   Marital status: Single    Spouse name: Not on file   Number of children: 0   Years of education: Not on file   Highest education level: Not on file  Occupational History   Occupation: disabled  Tobacco Use   Smoking status: Never   Smokeless tobacco: Never  Vaping Use   Vaping Use: Never used  Substance and Sexual Activity   Alcohol use: Not Currently    Comment: beer and mixed drink maybe 5  times a month   Drug use: No   Sexual activity: Never  Other Topics Concern   Not on file  Social History Narrative   Not on file   Social Determinants of Health   Financial Resource Strain: Low Risk  (12/15/2021)   Overall Financial Resource Strain (CARDIA)    Difficulty of Paying Living Expenses: Not hard at all  Food Insecurity: No Food Insecurity (03/29/2022)   Hunger Vital Sign    Worried About Running Out of Food in the Last Year: Never true    Ran Out of Food in the Last Year: Never true  Transportation Needs: No Transportation Needs (03/29/2022)   PRAPARE - Hydrologist (Medical): No    Lack of Transportation (Non-Medical): No  Physical Activity: Inactive (12/15/2021)   Exercise Vital Sign    Days of Exercise per Week: 0 days    Minutes of Exercise per Session: 0 min  Stress: No Stress Concern Present (12/15/2021)   [place]    Feeling of  Stress : Not at all  Social Connections: Socially Isolated (12/15/2021)   Social Connection and Isolation Panel [NHANES]    Frequency of Communication with Friends and Family: More than three times a week    Frequency of Social Gatherings with Friends and Family: More than three times a week    Attends Religious Services: Never    Marine scientist or Organizations: No    Attends Archivist Meetings: Never    Marital Status: Never married   Family History  Problem Relation Age of Onset   Breast cancer Mother    Cancer Mother        small intestine   Liver cancer Mother  Diabetes Mother    Diabetes Father    Diabetes Brother    Hypertension Maternal Grandmother    Heart Problems Maternal Grandmother    Diabetes Paternal Grandmother    Diabetes Paternal Grandfather    Diabetes Brother     ASSESSMENT Recent Results: The most recent result is correlated with 80 mg per week: Lab Results  Component Value Date   INR 2.7 09/24/2022   INR 2.3 09/04/2022   INR 2.1 08/14/2022    Anticoagulation Dosing: Description   Take 2 (two) of your 5 mg peach-colored warfarin tablets, on MONDAYS, [place]. ALL other days, take 2 & 1/2 (two-and-one-half) of your 5 mg peach-colored warfarin tablets.      INR today: Therapeutic  PLAN Weekly dose was unchanged.  Patient Instructions  Patient instructed to take medications as defined in the Anti-coagulation Track section of this encounter.  Patient instructed to take today's dose.  Patient instructed to take 2 (two) of your 5 mg peach-colored warfarin tablets, on MONDAYS, [place]. ALL other days, take 2 & 1/2 (two-and-one-half) of your 5 mg peach-colored warfarin tablets.  Patient verbalized understanding of these instructions.  Patient advised to contact clinic or seek medical attention if signs/symptoms of bleeding or thromboembolism occur.  Patient verbalized understanding by repeating back  information and was advised to contact me if further medication-related questions arise. Patient was also provided an information handout.  Follow-up Return in 4 weeks (on 10/22/2022) for Follow up INR.  Pennie Banter, PharmD, CPP  15 minutes spent face-to-face with the patient during the encounter. 50% of time spent on education, including signs/sx bleeding and clotting, as well as food and drug interactions with warfarin. 50% of time was spent on fingerprick POC INR sample collection,processing, results determination, and documentation in http://www.kim.net/.

## 2022-09-25 LAB — COMPREHENSIVE METABOLIC PANEL
ALT: 25 U/L (ref 0–53)
AST: 28 U/L (ref 0–37)
Albumin: 4.1 g/dL (ref 3.5–5.2)
Alkaline Phosphatase: 60 U/L (ref 39–117)
BUN: 19 mg/dL (ref 6–23)
CO2: 29 mEq/L (ref 19–32)
Calcium: 9.6 mg/dL (ref 8.4–10.5)
Chloride: 101 mEq/L (ref 96–112)
Creatinine, Ser: 0.86 mg/dL (ref 0.40–1.50)
GFR: 94.47 mL/min (ref >60.00)
Glucose, Bld: 83 mg/dL (ref 70–99)
Potassium: 4 mEq/L (ref 3.5–5.1)
Sodium: 138 mEq/L (ref 135–145)
Total Bilirubin: 0.3 mg/dL (ref 0.2–1.2)
Total Protein: 7.2 g/dL (ref 6.0–8.3)

## 2022-09-25 LAB — LIPID PANEL
Cholesterol: 150 mg/dL (ref 0–200)
HDL: 61.1 mg/dL (ref >39.00)
LDL Cholesterol: 79 mg/dL (ref 0–99)
NonHDL: 88.95
Total CHOL/HDL Ratio: 2
Triglycerides: 51 mg/dL (ref 0.0–149.0)
VLDL: 10.2 mg/dL (ref 0.0–40.0)

## 2022-09-25 LAB — MICROALBUMIN / CREATININE URINE RATIO
Creatinine,U: 53.2 mg/dL
Microalb Creat Ratio: 1.7 mg/g (ref 0.0–30.0)
Microalb, Ur: 0.9 mg/dL (ref 0.0–1.9)

## 2022-09-25 LAB — HEMOGLOBIN A1C: Hgb A1c MFr Bld: 6.9 % — ABNORMAL HIGH (ref 4.6–6.5)

## 2022-09-25 NOTE — Progress Notes (Signed)
Evaluation and management procedures were performed by the Clinical Pharmacy Practitioner under my supervision and collaboration. I have reviewed the Practitioner's note and chart, and I agree with the management and plan as documented above. ° °

## 2022-09-26 ENCOUNTER — Telehealth: Payer: Self-pay | Admitting: Orthopedic Surgery

## 2022-09-26 NOTE — Telephone Encounter (Signed)
Dr. Sharol Given has an opening tomorrow morning at 9:15 am. I will call to get pt on schedule then.

## 2022-09-26 NOTE — Telephone Encounter (Signed)
I called pt and he is scheduled 9:15 am tomorrow with Dr. Sharol Given.

## 2022-09-26 NOTE — Telephone Encounter (Signed)
Patient states he has a sore on his left foot and heal and want to be seen asap. Please call patient

## 2022-09-27 ENCOUNTER — Encounter: Payer: Self-pay | Admitting: Orthopedic Surgery

## 2022-09-27 ENCOUNTER — Encounter: Payer: Self-pay | Admitting: Endocrinology

## 2022-09-27 ENCOUNTER — Telehealth: Payer: Self-pay | Admitting: Orthopedic Surgery

## 2022-09-27 ENCOUNTER — Ambulatory Visit (INDEPENDENT_AMBULATORY_CARE_PROVIDER_SITE_OTHER): Payer: Medicare HMO | Admitting: Endocrinology

## 2022-09-27 ENCOUNTER — Ambulatory Visit (INDEPENDENT_AMBULATORY_CARE_PROVIDER_SITE_OTHER): Payer: Medicare HMO | Admitting: Orthopedic Surgery

## 2022-09-27 VITALS — BP 102/68 | HR 105 | Ht 72.0 in

## 2022-09-27 DIAGNOSIS — L02612 Cutaneous abscess of left foot: Secondary | ICD-10-CM

## 2022-09-27 DIAGNOSIS — Z794 Long term (current) use of insulin: Secondary | ICD-10-CM | POA: Diagnosis not present

## 2022-09-27 DIAGNOSIS — M86272 Subacute osteomyelitis, left ankle and foot: Secondary | ICD-10-CM | POA: Diagnosis not present

## 2022-09-27 DIAGNOSIS — E1165 Type 2 diabetes mellitus with hyperglycemia: Secondary | ICD-10-CM

## 2022-09-27 DIAGNOSIS — E1142 Type 2 diabetes mellitus with diabetic polyneuropathy: Secondary | ICD-10-CM | POA: Diagnosis not present

## 2022-09-27 DIAGNOSIS — E782 Mixed hyperlipidemia: Secondary | ICD-10-CM | POA: Diagnosis not present

## 2022-09-27 MED ORDER — DOXYCYCLINE HYCLATE 100 MG PO TABS: 100.0000 mg | ORAL_TABLET | Freq: Two times a day (BID) | ORAL | 0 refills | Status: DC

## 2022-09-27 NOTE — Telephone Encounter (Signed)
Patient asking if his antibiotics have been call in yet? Please advise

## 2022-09-27 NOTE — Telephone Encounter (Signed)
Patient has now received patient assistance. Pt did not need the [place] and given back to provider to use as samples.

## 2022-09-27 NOTE — Patient Instructions (Signed)
Tresiba 6 units daily till sugar 110--120 range  Check after after
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
## 2022-09-27 NOTE — Progress Notes (Signed)
Office Visit Note   Patient: Gregory May           Date of Birth: [birth year]           MRN: [id number] Visit Date: 09/27/2022              Requested by: Idamae Schuller, MD [address] [place],  [place] [id number] PCP: Idamae Schuller, MD  Chief Complaint  Patient presents with   Left Foot - Wound Check      HPI: Patient is a 62 year old gentleman who presents with increasing pain and odor and drainage from an ulcer left plantar heel.  Patient states the ulcer is getting worse with more pain.  Patient states with trying to offload the left heel he is developing pain in the right residual limb.  Patient's most recent INR is 2.7.  Assessment & Plan: Visit Diagnoses: No diagnosis found.  Plan: With patient's large ulcer over the calcaneus and purulent drainage and ascending cellulitis recommended proceeding with a transtibial amputation of the left.  Patient states he understands wished to proceed with surgery at this time he is unsure if he would be available for surgery next week.  Will call in a prescription for doxycycline and post for Wednesday.  Follow-Up Instructions: No follow-ups on file.   Ortho Exam  Patient is alert, oriented, no adenopathy, well-dressed, normal affect, normal respiratory effort. Examination patient has a small scab on the residual limb on the right there is no redness cellulitis swelling no signs of infection.  Examination the left foot is a good dorsalis pedis pulse.  He has a large necrotic ulcer over the plantar aspect of the calcaneus.  The ulcer is 1 cm diameter and 6 cm in circumference along the calcaneus with a completely exposed calcaneus and foul smelling drainage.  There is cellulitis that extends to the medial ankle.  Imaging: No results found. No images are attached to the encounter.  Labs: Lab Results  Component Value Date   HGBA1C 6.9 (H) 09/24/2022   HGBA1C 7.2 (H) 03/08/2022   HGBA1C 7.2 (H) 11/29/2021   ESRSEDRATE 11  11/15/2021   ESRSEDRATE 25 (H) 08/19/2021   ESRSEDRATE 8 07/15/2019   CRP 0.7 11/17/2021   CRP <0.5 11/15/2021   CRP 1.7 (H) 08/19/2021   REPTSTATUS 08/26/2021 FINAL 08/21/2021   GRAMSTAIN  08/21/2021    WBC PRESENT,BOTH PMN AND MONONUCLEAR NO ORGANISMS SEEN    CULT  08/21/2021    No growth aerobically or anaerobically. Performed at [facility], [place] [address] [place], [place] [address]    LABORGA STAPHYLOCOCCUS HOMINIS 08/21/2021     Lab Results  Component Value Date   ALBUMIN 4.1 09/24/2022   ALBUMIN 4.2 03/08/2022   ALBUMIN 4.6 11/29/2021    Lab Results  Component Value Date   MG 1.9 11/15/2021   Lab Results  Component Value Date   VD25OH 16 (L) 11/05/2013    No results found for: "PREALBUMIN"    Latest Ref Rng & Units 02/19/2022   12:40 PM 01/17/2022    3:43 PM 11/16/2021    3:40 AM  CBC EXTENDED  WBC 3.4 - 10.8 x10E3/uL 8.3  8.4  8.2   RBC 4.14 - 5.80 x10E6/uL 4.82  4.88  4.82   Hemoglobin 13.0 - 17.7 g/dL 15.8  15.2  15.3   HCT 37.5 - 51.0 % 45.5  45.9  46.2   Platelets 150 - 450 x10E3/uL 291  297  257  There is no height or weight on file to calculate BMI.  Orders:  No orders of the defined types were placed in this encounter.  Meds ordered this encounter  Medications   doxycycline (VIBRA-TABS) 100 MG tablet    Sig: Take 1 tablet (100 mg total) by mouth 2 (two) times daily.    Dispense:  30 tablet    Refill:  0     Procedures: No procedures performed  Clinical Data: No additional findings.  ROS:  All other systems negative, except as noted in the HPI. Review of Systems  Objective: Vital Signs: There were no vitals taken for this visit.  Specialty Comments:  No specialty comments available.  PMFS History: Patient Active Problem List   Diagnosis Date Noted   PFO with atrial septal aneurysm    Long term (current) use of anticoagulants 02/26/2022   Right retinal embolus 01/15/2022   DM type 2 with diabetic peripheral  neuropathy ([place]) 01/15/2022   Obstructive sleep apnea 01/15/2022   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   Transportation insecurity 11/23/2021   Retinal artery occlusion, central 11/15/2021   Epidermal inclusion cyst 10/18/2021   Acute postoperative pain 09/05/2021   Amputated finger 09/05/2021   Osteomyelitis of finger of right hand ([place]) 08/19/2021   Cellulitis of finger of right hand 08/17/2021   Osteomyelitis of second toe of left foot ([place])    Hepatic steatosis 06/24/2020   Back pain 04/20/2020   Flu vaccine need 04/20/2020   COVID-19 virus vaccination declined 04/20/2020   Left knee pain 04/20/2020   Allergic sinusitis 04/20/2020   Elevated liver enzymes 04/20/2020   Diastasis of rectus abdominis 07/16/2019   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 10/07/2018   Essential hypertension 06/19/2018   Peripheral neuropathy 12/19/2017   Acquired absence of right leg below knee ([place]) 04/17/2017   Carpal tunnel syndrome 03/18/2017   Colon cancer screening 06/21/2015   Status post below knee amputation of right lower extremity ([place]) 06/07/2015   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 ([place]) 06/01/2015   Diabetic polyneuropathy associated with type 2 diabetes mellitus ([place]) 12/09/2014   Diabetes mellitus with carpal tunnel syndrome ([place]) 12/09/2014   DJD (degenerative joint disease) of knee 07/21/2014   Osteoarthritis, hip, bilateral 05/25/2014   Onychomycosis 10/14/2013   Pure hypercholesterolemia 09/25/2013   Diabetic foot ulcer ([place]) 09/15/2013   Poorly controlled type 2 diabetes mellitus with complication ([place]) [id number]   Past Medical History:  Diagnosis Date   Arthritis    bilateral hips   Cutaneous abscess of left foot    Deep vein thrombosis (DVT) (HCC)    Diabetes mellitus    type II   Diabetic ulcer of heel (HCC)    Right heel   DJD (degenerative joint disease)    DVT (deep venous thrombosis) ([place]) 02/08/2014   Proximal  provoked. Date of diagnosis February 08 2014 Duration of anticoagulation: 6 months. End date 08/12/2014.  Anticoagulant: Lovenox 120 units daily Switched to Eliquis on 05/25/2014      Ear drum perforation, right 03/06/2019   Non-pressure chronic ulcer of right calf, limited to breakdown of skin ([place]) 04/17/2017   Pulmonary emboli ([place]) 02/08/2014   Date of diagnosis February 08 2014, on chest CTA Hospitalized for 3 days Had some symptoms of shortness of breath, and chest pain With intercurrent DVT of the left LE. Duration of anticoagulation: 8 months. End date 10/11/2014.  Anticoagulant: Lovenox 120 units daily Switched to Eliquis on 05/25/2014  per patient preference    Pulmonary embolism ([place])    Sebaceous cyst    on back of neck    Family History  Problem Relation Age of Onset   Breast cancer Mother    Cancer Mother        small intestine   Liver cancer Mother    Diabetes Mother    Diabetes Father    Diabetes Brother    Hypertension Maternal Grandmother    Heart Problems Maternal Grandmother    Diabetes Paternal Grandmother    Diabetes Paternal Grandfather    Diabetes Brother     Past Surgical History:  Procedure Laterality Date   AMPUTATION Right 06/03/2015   Procedure: Right Below Knee Amputation;  Surgeon: [name], MD;  Location: [facility];  Service: Orthopedics;  Laterality: Right;   AMPUTATION Left 07/24/2019   Procedure: LEFT FOOT 3RD RAY AMPUTATION;  Surgeon: [name], MD;  Location: [facility];  Service: Orthopedics;  Laterality: Left;   AMPUTATION Left 06/23/2021   Procedure: LEFT 2ND TOE AMPUTATION;  Surgeon: [name], MD;  Location: [facility];  Service: Orthopedics;  Laterality: Left;   AMPUTATION Right 08/21/2021   Procedure: AMPUTATION RIGHT INDEX FINGER;  Surgeon: Orene Desanctis, MD;  Location: [facility];  Service: Orthopedics;  Laterality: Right;   BUBBLE STUDY  01/24/2022   Procedure: BUBBLE STUDY;  Surgeon: Josue Hector, MD;  Location: [facility];  Service: Cardiovascular;;    CLOSED REDUCTION WITH HUMER PIN INSERTION  1974   left hip   HARDWARE REMOVAL Left 07/21/2014   Procedure: HARDWARE REMOVAL;  Surgeon: Ninetta Lights, MD;  Location: [facility];  Service: Orthopedics;  Laterality: Left;   I & D EXTREMITY Right 08/21/2021   Procedure: IRRIGATION AND DEBRIDEMENT RIGHT INDEX FINGER;  Surgeon: Orene Desanctis, MD;  Location: [facility];  Service: Orthopedics;  Laterality: Right;   PATENT FORAMEN OVALE(PFO) CLOSURE N/A 02/28/2022   Procedure: PATENT FORAMEN OVALE(PFO) CLOSURE;  Surgeon: Sherren Mocha, MD;  Location: [facility];  Service: Cardiovascular;  Laterality: N/A;   ROTATOR CUFF REPAIR Right 2005 (approx)   TEE WITHOUT CARDIOVERSION N/A 01/24/2022   Procedure: TRANSESOPHAGEAL ECHOCARDIOGRAM (TEE);  Surgeon: Josue Hector, MD;  [facility] ENDOSCOPY;  Service: Cardiovascular;  Laterality: N/A;   TOTAL HIP ARTHROPLASTY Left 07/21/2014   Procedure: TOTAL HIP ARTHROPLASTY ANTERIOR APPROACH;  Surgeon: Ninetta Lights, MD;  Location: [facility];  Service: Orthopedics;  Laterality: Left;   TOTAL HIP ARTHROPLASTY Right 2006 (approx)   right hip replaced   Social History   Occupational History   Occupation: disabled  Tobacco Use   Smoking status: Never   Smokeless tobacco: Never  Vaping Use   Vaping Use: Never used  Substance and Sexual Activity   Alcohol use: Not Currently    Comment: beer and mixed drink maybe 5  times a month   Drug use: No   Sexual activity: Never

## 2022-09-27 NOTE — Telephone Encounter (Signed)
Pt informed that doxycycline been sent in to pharmacy

## 2022-09-28 ENCOUNTER — Encounter: Payer: Self-pay | Admitting: Orthopedic Surgery

## 2022-09-28 ENCOUNTER — Inpatient Hospital Stay (HOSPITAL_COMMUNITY)
Admission: EM | Admit: 2022-09-28 | Discharge: 2022-10-03 | DRG: 617 | Disposition: A | Discharge status: Skilled Nursing Facility | Payer: Medicare HMO | Attending: Internal Medicine | Admitting: Internal Medicine

## 2022-09-28 ENCOUNTER — Other Ambulatory Visit: Payer: Self-pay

## 2022-09-28 ENCOUNTER — Emergency Department (HOSPITAL_COMMUNITY): Payer: Medicare HMO

## 2022-09-28 ENCOUNTER — Other Ambulatory Visit: Payer: Self-pay | Admitting: Orthopedic Surgery

## 2022-09-28 ENCOUNTER — Encounter (HOSPITAL_COMMUNITY): Payer: Self-pay | Admitting: Orthopedic Surgery

## 2022-09-28 ENCOUNTER — Telehealth: Payer: Self-pay | Admitting: Family

## 2022-09-28 DIAGNOSIS — Z89511 Acquired absence of right leg below knee: Secondary | ICD-10-CM | POA: Diagnosis not present

## 2022-09-28 DIAGNOSIS — Z833 Family history of diabetes mellitus: Secondary | ICD-10-CM

## 2022-09-28 DIAGNOSIS — L03116 Cellulitis of left lower limb: Secondary | ICD-10-CM | POA: Diagnosis present

## 2022-09-28 DIAGNOSIS — E1169 Type 2 diabetes mellitus with other specified complication: Secondary | ICD-10-CM | POA: Diagnosis present

## 2022-09-28 DIAGNOSIS — L02612 Cutaneous abscess of left foot: Secondary | ICD-10-CM | POA: Diagnosis present

## 2022-09-28 DIAGNOSIS — G459 Transient cerebral ischemic attack, unspecified: Secondary | ICD-10-CM | POA: Diagnosis not present

## 2022-09-28 DIAGNOSIS — M86172 Other acute osteomyelitis, left ankle and foot: Secondary | ICD-10-CM | POA: Diagnosis present

## 2022-09-28 DIAGNOSIS — M160 Bilateral primary osteoarthritis of hip: Secondary | ICD-10-CM | POA: Diagnosis present

## 2022-09-28 DIAGNOSIS — L97429 Non-pressure chronic ulcer of left heel and midfoot with unspecified severity: Secondary | ICD-10-CM | POA: Diagnosis present

## 2022-09-28 DIAGNOSIS — Z800 Family history of malignant neoplasm of digestive organs: Secondary | ICD-10-CM | POA: Diagnosis not present

## 2022-09-28 DIAGNOSIS — Z86718 Personal history of other venous thrombosis and embolism: Secondary | ICD-10-CM

## 2022-09-28 DIAGNOSIS — S92342A Displaced fracture of fourth metatarsal bone, left foot, initial encounter for closed fracture: Secondary | ICD-10-CM | POA: Diagnosis not present

## 2022-09-28 DIAGNOSIS — E1165 Type 2 diabetes mellitus with hyperglycemia: Secondary | ICD-10-CM | POA: Diagnosis present

## 2022-09-28 DIAGNOSIS — L8990 Pressure ulcer of unspecified site, unspecified stage: Secondary | ICD-10-CM | POA: Insufficient documentation

## 2022-09-28 DIAGNOSIS — L03119 Cellulitis of unspecified part of limb: Secondary | ICD-10-CM | POA: Diagnosis not present

## 2022-09-28 DIAGNOSIS — G546 Phantom limb syndrome with pain: Secondary | ICD-10-CM | POA: Diagnosis not present

## 2022-09-28 DIAGNOSIS — Z794 Long term (current) use of insulin: Secondary | ICD-10-CM

## 2022-09-28 DIAGNOSIS — Z86711 Personal history of pulmonary embolism: Secondary | ICD-10-CM | POA: Diagnosis not present

## 2022-09-28 DIAGNOSIS — Z7401 Bed confinement status: Secondary | ICD-10-CM | POA: Diagnosis not present

## 2022-09-28 DIAGNOSIS — Z89021 Acquired absence of right finger(s): Secondary | ICD-10-CM

## 2022-09-28 DIAGNOSIS — Z7984 Long term (current) use of oral hypoglycemic drugs: Secondary | ICD-10-CM | POA: Diagnosis not present

## 2022-09-28 DIAGNOSIS — Z4781 Encounter for orthopedic aftercare following surgical amputation: Secondary | ICD-10-CM | POA: Diagnosis not present

## 2022-09-28 DIAGNOSIS — L723 Sebaceous cyst: Secondary | ICD-10-CM | POA: Diagnosis present

## 2022-09-28 DIAGNOSIS — M1710 Unilateral primary osteoarthritis, unspecified knee: Secondary | ICD-10-CM | POA: Diagnosis present

## 2022-09-28 DIAGNOSIS — Z89512 Acquired absence of left leg below knee: Secondary | ICD-10-CM | POA: Diagnosis present

## 2022-09-28 DIAGNOSIS — M009 Pyogenic arthritis, unspecified: Secondary | ICD-10-CM | POA: Diagnosis not present

## 2022-09-28 DIAGNOSIS — E785 Hyperlipidemia, unspecified: Secondary | ICD-10-CM | POA: Diagnosis present

## 2022-09-28 DIAGNOSIS — E119 Type 2 diabetes mellitus without complications: Secondary | ICD-10-CM | POA: Diagnosis not present

## 2022-09-28 DIAGNOSIS — Z8774 Personal history of (corrected) congenital malformations of heart and circulatory system: Secondary | ICD-10-CM | POA: Diagnosis not present

## 2022-09-28 DIAGNOSIS — R2681 Unsteadiness on feet: Secondary | ICD-10-CM | POA: Diagnosis not present

## 2022-09-28 DIAGNOSIS — L89624 Pressure ulcer of left heel, stage 4: Secondary | ICD-10-CM | POA: Diagnosis present

## 2022-09-28 DIAGNOSIS — E118 Type 2 diabetes mellitus with unspecified complications: Secondary | ICD-10-CM | POA: Diagnosis not present

## 2022-09-28 DIAGNOSIS — I82409 Acute embolism and thrombosis of unspecified deep veins of unspecified lower extremity: Secondary | ICD-10-CM | POA: Diagnosis not present

## 2022-09-28 DIAGNOSIS — K760 Fatty (change of) liver, not elsewhere classified: Secondary | ICD-10-CM | POA: Diagnosis present

## 2022-09-28 DIAGNOSIS — Z803 Family history of malignant neoplasm of breast: Secondary | ICD-10-CM

## 2022-09-28 DIAGNOSIS — R41841 Cognitive communication deficit: Secondary | ICD-10-CM | POA: Diagnosis not present

## 2022-09-28 DIAGNOSIS — L089 Local infection of the skin and subcutaneous tissue, unspecified: Secondary | ICD-10-CM | POA: Diagnosis not present

## 2022-09-28 DIAGNOSIS — Z79899 Other long term (current) drug therapy: Secondary | ICD-10-CM | POA: Diagnosis not present

## 2022-09-28 DIAGNOSIS — E1142 Type 2 diabetes mellitus with diabetic polyneuropathy: Secondary | ICD-10-CM | POA: Diagnosis not present

## 2022-09-28 DIAGNOSIS — Z96643 Presence of artificial hip joint, bilateral: Secondary | ICD-10-CM | POA: Diagnosis present

## 2022-09-28 DIAGNOSIS — H3411 Central retinal artery occlusion, right eye: Secondary | ICD-10-CM | POA: Diagnosis present

## 2022-09-28 DIAGNOSIS — R739 Hyperglycemia, unspecified: Secondary | ICD-10-CM | POA: Diagnosis not present

## 2022-09-28 DIAGNOSIS — E11621 Type 2 diabetes mellitus with foot ulcer: Secondary | ICD-10-CM | POA: Diagnosis present

## 2022-09-28 DIAGNOSIS — I10 Essential (primary) hypertension: Secondary | ICD-10-CM | POA: Diagnosis not present

## 2022-09-28 DIAGNOSIS — Z7901 Long term (current) use of anticoagulants: Secondary | ICD-10-CM | POA: Diagnosis not present

## 2022-09-28 DIAGNOSIS — M6281 Muscle weakness (generalized): Secondary | ICD-10-CM | POA: Diagnosis not present

## 2022-09-28 DIAGNOSIS — S92532A Displaced fracture of distal phalanx of left lesser toe(s), initial encounter for closed fracture: Secondary | ICD-10-CM | POA: Diagnosis not present

## 2022-09-28 DIAGNOSIS — Z8249 Family history of ischemic heart disease and other diseases of the circulatory system: Secondary | ICD-10-CM

## 2022-09-28 DIAGNOSIS — E1140 Type 2 diabetes mellitus with diabetic neuropathy, unspecified: Secondary | ICD-10-CM | POA: Diagnosis present

## 2022-09-28 LAB — COMPREHENSIVE METABOLIC PANEL
ALT: 36 U/L (ref 0–44)
AST: 40 U/L (ref 15–41)
Albumin: 2.8 g/dL — ABNORMAL LOW (ref 3.5–5.0)
Alkaline Phosphatase: 78 U/L (ref 38–126)
Anion gap: 12 (ref 5–15)
BUN: 24 mg/dL — ABNORMAL HIGH (ref 6–20)
CO2: 24 mmol/L (ref 22–32)
Calcium: 8.6 mg/dL — ABNORMAL LOW (ref 8.9–10.3)
Chloride: 99 mmol/L (ref 98–111)
Creatinine, Ser: 0.92 mg/dL (ref 0.61–1.24)
GFR, Estimated: >60 mL/min (ref >60)
Glucose, Bld: 179 mg/dL — ABNORMAL HIGH (ref 70–99)
Potassium: 3.8 mmol/L (ref 3.5–5.1)
Sodium: 135 mmol/L (ref 135–145)
Total Bilirubin: 0.3 mg/dL (ref 0.3–1.2)
Total Protein: 7 g/dL (ref 6.5–8.1)

## 2022-09-28 LAB — CBC WITH DIFFERENTIAL/PLATELET
Abs Immature Granulocytes: 0.15 10*3/uL — ABNORMAL HIGH (ref 0.00–0.07)
Basophils Absolute: 0 10*3/uL (ref 0.0–0.1)
Basophils Relative: 0 %
Eosinophils Absolute: 0 10*3/uL (ref 0.0–0.5)
Eosinophils Relative: 0 %
HCT: 43.3 % (ref 39.0–52.0)
Hemoglobin: 14 g/dL (ref 13.0–17.0)
Immature Granulocytes: 1 %
Lymphocytes Relative: 5 %
Lymphs Abs: 1 10*3/uL (ref 0.7–4.0)
MCH: 30.6 pg (ref 26.0–34.0)
MCHC: 32.3 g/dL (ref 30.0–36.0)
MCV: 94.7 fL (ref 80.0–100.0)
Monocytes Absolute: 1.6 10*3/uL — ABNORMAL HIGH (ref 0.1–1.0)
Monocytes Relative: 8 %
Neutro Abs: 16.3 10*3/uL — ABNORMAL HIGH (ref 1.7–7.7)
Neutrophils Relative %: 86 %
Platelets: 354 10*3/uL (ref 150–400)
RBC: 4.57 MIL/uL (ref 4.22–5.81)
RDW: 14.3 % (ref 11.5–15.5)
WBC: 19.2 10*3/uL — ABNORMAL HIGH (ref 4.0–10.5)
nRBC: 0 % (ref 0.0–0.2)

## 2022-09-28 LAB — LACTIC ACID, PLASMA: Lactic Acid, Venous: 1.6 mmol/L (ref 0.5–1.9)

## 2022-09-28 LAB — PROTIME-INR
INR: 2.9 — ABNORMAL HIGH (ref 0.8–1.2)
Prothrombin Time: 29.7 seconds — ABNORMAL HIGH (ref 11.4–15.2)

## 2022-09-28 MED ORDER — HYDROCODONE-ACETAMINOPHEN 5-325 MG PO TABS: 1.0000 | ORAL_TABLET | Freq: Four times a day (QID) | ORAL | 0 refills | Status: DC | PRN

## 2022-09-28 MED ORDER — ACETAMINOPHEN 500 MG PO TABS
1000.0000 mg | ORAL_TABLET | Freq: Once | ORAL | Status: AC
  Administered 2022-09-28: 1000 mg via ORAL
  Filled 2022-09-28: qty 2

## 2022-09-28 NOTE — ED Triage Notes (Signed)
Patient is scheduled for left BKA surgery tomorrow morning , reports pain at left thigh and fever this afternoon .

## 2022-09-28 NOTE — Anesthesia Preprocedure Evaluation (Signed)
Anesthesia Evaluation  Patient identified by MRN, date of birth, ID band Patient awake    Reviewed: Allergy & Precautions, NPO status , Patient's Chart, lab work & pertinent test results  Airway Mallampati: III  TM Distance: >3 FB Neck ROM: Full    Dental  (+) Poor Dentition, Missing, Chipped   Pulmonary sleep apnea    Pulmonary exam normal breath sounds clear to auscultation       Cardiovascular hypertension, + DVT  Normal cardiovascular exam Rhythm:Regular Rate:Normal     Neuro/Psych negative neurological ROS  negative psych ROS   GI/Hepatic Neg liver ROS,GERD  Medicated,,  Endo/Other  diabetes, Type 2, Oral Hypoglycemic Agents, Insulin Dependent    Renal/GU negative Renal ROS     Musculoskeletal  (+) Arthritis , Osteoarthritis,    Abdominal   Peds  Hematology   Anesthesia Other Findings   Reproductive/Obstetrics                             Anesthesia Physical Anesthesia Plan  ASA: 3  Anesthesia Plan:    Post-op Pain Management: Regional block* and Tylenol PO (pre-op)*   Induction: Intravenous  PONV Risk Score and Plan: 1  Airway Management Planned:   Additional Equipment:   Intra-op Plan:   Post-operative Plan:   Informed Consent: I have reviewed the patients History and Physical, chart, labs and discussed the procedure including the risks, benefits and alternatives for the proposed anesthesia with the patient or authorized representative who has indicated his/her understanding and acceptance.     Dental advisory given  Plan Discussed with: CRNA  Anesthesia Plan Comments:         Anesthesia Quick Evaluation

## 2022-09-28 NOTE — Telephone Encounter (Signed)
I called patient to let him know pain medication was sent to pharmacy. He states that he is running a fever of almost 101.0 and was concerned he should go to the hospital. He is taking his antibiotic as prescribed. He has not had any acetaminophen today. He is having someone pick up the vicodin for him.  Patient would like to know what you suggest in regards to the fever. Do you want him to take tylenol in addition to his vicodin? Surgery is scheduled for next week.  CB [phone]

## 2022-09-28 NOTE — Progress Notes (Signed)
PCP - Dr Humphrey Rolls Cardiologist - Sherren Mocha  EKG - 02/19/2022 Chest x-ray - Denies ECHO - 02/28/2022 Cardiac Cath - 02/28/2022  Sleep Study- Denies  DM- Yes.  Type II.  Last A1c 6.9 per endocrinology note. Fasting Blood Sugar:  100-130's Checks Blood Sugar:  twice/day  Blood Thinner Instructions: Hold Coumadin.  Per pt last dose taken on 2/16 Aspirin Instructions: N/A ERAS Protcol - Yes.   COVID TEST- N/A Pt denies any symptoms  Anesthesia review: Yes, cardiac hx on coumadin, uncontrolled DM.   -------------  SDW INSTRUCTIONS:  Your procedure is scheduled on Saturday Feb 17th. Please report to [facility] Main Entrance "A" at 0600 A.M., and check in at the Admitting office. Call this number if you have problems the morning of surgery: [phone]   Remember: Do not eat  after midnight the night before your surgery  You may drink clear liquids until 0430 the morning of your surgery.   Clear liquids allowed are: Water, Non-Citrus Juices (without pulp), Carbonated Beverages, Clear Tea, Black Coffee Only, and Gatorade   Medications to take morning of surgery with a sip of water include: amoxicillin-clavulanate (AUGMENTIN)  doxycycline (VIBRA-TABS)  rosuvastatin (CRESTOR)    IF NEEDED acetaminophen (TYLENOL)  gabapentin (NEURONTIN) 300 MG  HYDROcodone-acetaminophen (NORCO/VICODIN) 5-325 MG tablet   As of today, STOP taking any Aspirin (unless otherwise instructed by your surgeon), Aleve, Naproxen, Ibuprofen, Motrin, Advil, Goody's, BC's, all herbal medications, fish oil, and all vitamins.   WHAT DO I DO ABOUT MY DIABETES MEDICATION?   Do not take oral diabetes medicines (pills) the morning of surgery such as metFORMIN (GLUCOPHAGE-XR), pioglitazone (ACTOS) 30 MG  . Stop empagliflozin (JARDIANCE)   THE NIGHT BEFORE SURGERY, take _______6____ units of _____insulin degludec (TRESIBA FLEXTOUCH) ______insulin.       THE MORNING OF SURGERY, None  The day of surgery, do not  take other diabetes injectables, including Byetta (exenatide), Bydureon (exenatide ER), Victoza (liraglutide), or Trulicity (dulaglutide).  If your CBG is greater than 220 mg/dL, you may take  of your sliding scale (correction) dose of insulin.   HOW TO MANAGE YOUR DIABETES BEFORE AND AFTER SURGERY  Why is it important to control my blood sugar before and after surgery? Improving blood sugar levels before and after surgery helps healing and can limit problems. A way of improving blood sugar control is eating a healthy diet by:  Eating less sugar and carbohydrates  Increasing activity/exercise  Talking with your doctor about reaching your blood sugar goals High blood sugars (greater than 180 mg/dL) can raise your risk of infections and slow your recovery, so you will need to focus on controlling your diabetes during the weeks before surgery. Make sure that the doctor who takes care of your diabetes knows about your planned surgery including the date and location.  How do I manage my blood sugar before surgery? Check your blood sugar at least 4 times a day, starting 2 days before surgery, to make sure that the level is not too high or low.  Check your blood sugar the morning of your surgery when you wake up and every 2 hours until you get to the Short Stay unit.  If your blood sugar is less than 70 mg/dL, you will need to treat for low blood sugar: Do not take insulin. Treat a low blood sugar (less than 70 mg/dL) with  cup of clear juice (cranberry or apple), 4 glucose tablets, OR glucose gel. Recheck blood sugar in 15 minutes after  treatment (to make sure it is greater than 70 mg/dL). If your blood sugar is not greater than 70 mg/dL on recheck, call [phone] for further instructions. Report your blood sugar to the short stay nurse when you get to Short Stay.  If you are admitted to the hospital after surgery: Your blood sugar will be checked by the staff and you will probably be  given insulin after surgery (instead of oral diabetes medicines) to make sure you have good blood sugar levels. The goal for blood sugar control after surgery is 80-180 mg/dL.    The Morning of Surgery Do not wear jewelr Do not wear lotions, powders, or colognes, or deodorant Do not bring valuables to the hospital. [facility] is not responsible for any belongings or valuables.  If you are a smoker, DO NOT Smoke 24 hours prior to surgery  If you wear a CPAP at night please bring your mask the morning of surgery   Remember that you must have someone to transport you home after your surgery, and remain with you for 24 hours if you are discharged the same day.  Please bring cases for contacts, glasses, hearing aids, dentures or bridgework because it cannot be worn into surgery.   Patients discharged the day of surgery will not be allowed to drive home.   Please shower the NIGHT BEFORE/MORNING OF SURGERY (use antibacterial soap like DIAL soap if possible). Wear comfortable clothes the morning of surgery. Oral Hygiene is also important to reduce your risk of infection.  Remember - BRUSH YOUR TEETH THE MORNING OF SURGERY WITH YOUR REGULAR TOOTHPASTE  Patient denies shortness of breath, fever, cough and chest pain.

## 2022-09-28 NOTE — Telephone Encounter (Signed)
Pt called asking for some pain medication. He states he is in severe pains. Please send to pharmacy on file. Pt phone number is [phone].

## 2022-09-28 NOTE — ED Provider Triage Note (Signed)
Emergency Medicine Provider Triage Evaluation Note  Gregory May , a 62 y.o. male  was evaluated in triage.  Pt complains of worsening left leg pain/swelling.  Hx ulcer to left heel with reported osteomyelitis-- due for left BKA with Dr. Sharol Given soon.  States worsening pain/swelling of left lower leg over the past few days.  Developed fever yesterday, Dr. Sharol Given started him on oral ABX but advised to come to the ED for evaluation.  Continues with fever today.  Review of Systems  Positive: Leg pain, swelling, wound, fever Negative: vomiting  Physical Exam  BP 119/75   Pulse 89   Temp (!) 100.8 F (38.2 C)   Resp 19   SpO2 96%  Gen:   Awake, no distress   Resp:  Normal effort  MSK:   Moves extremities without difficulty  Other:  Dressing on left foot, some warmth to touch, erythema, and swelling noted to LLE  Medical Decision Making  Medically screening exam initiated at 10:15 PM.  Appropriate orders placed.  Gregory May was informed that the remainder of the evaluation will be completed by another provider, this initial triage assessment does not replace that evaluation, and the importance of remaining in the ED until their evaluation is complete.  LLE wound, due for BKA with Dr. Sharol Given soon.  He is febrile here with known source of infection.  Labs, cultures ordered along with x-ray.  Tylenol given for fever.   Larene Pickett, PA-C 09/28/22 2217

## 2022-09-29 ENCOUNTER — Other Ambulatory Visit: Payer: Self-pay

## 2022-09-29 ENCOUNTER — Encounter (HOSPITAL_COMMUNITY)
Admission: EM | Disposition: A | Discharge status: Skilled Nursing Facility | Payer: Self-pay | Source: Home / Self Care | Attending: Internal Medicine

## 2022-09-29 ENCOUNTER — Inpatient Hospital Stay (HOSPITAL_COMMUNITY): Payer: Medicare HMO | Admitting: Anesthesiology

## 2022-09-29 ENCOUNTER — Inpatient Hospital Stay: Admit: 2022-09-29 | Payer: Medicare HMO | Admitting: Orthopedic Surgery

## 2022-09-29 ENCOUNTER — Encounter (HOSPITAL_COMMUNITY): Payer: Self-pay | Admitting: Internal Medicine

## 2022-09-29 DIAGNOSIS — E785 Hyperlipidemia, unspecified: Secondary | ICD-10-CM | POA: Diagnosis present

## 2022-09-29 DIAGNOSIS — I10 Essential (primary) hypertension: Secondary | ICD-10-CM | POA: Diagnosis not present

## 2022-09-29 DIAGNOSIS — Z7984 Long term (current) use of oral hypoglycemic drugs: Secondary | ICD-10-CM

## 2022-09-29 DIAGNOSIS — Z86711 Personal history of pulmonary embolism: Secondary | ICD-10-CM | POA: Diagnosis not present

## 2022-09-29 DIAGNOSIS — E1165 Type 2 diabetes mellitus with hyperglycemia: Secondary | ICD-10-CM | POA: Diagnosis present

## 2022-09-29 DIAGNOSIS — Z89512 Acquired absence of left leg below knee: Secondary | ICD-10-CM | POA: Diagnosis not present

## 2022-09-29 DIAGNOSIS — L03116 Cellulitis of left lower limb: Secondary | ICD-10-CM | POA: Diagnosis present

## 2022-09-29 DIAGNOSIS — M1710 Unilateral primary osteoarthritis, unspecified knee: Secondary | ICD-10-CM | POA: Diagnosis present

## 2022-09-29 DIAGNOSIS — G546 Phantom limb syndrome with pain: Secondary | ICD-10-CM | POA: Diagnosis not present

## 2022-09-29 DIAGNOSIS — H3411 Central retinal artery occlusion, right eye: Secondary | ICD-10-CM | POA: Diagnosis present

## 2022-09-29 DIAGNOSIS — M86172 Other acute osteomyelitis, left ankle and foot: Secondary | ICD-10-CM

## 2022-09-29 DIAGNOSIS — Z794 Long term (current) use of insulin: Secondary | ICD-10-CM | POA: Diagnosis not present

## 2022-09-29 DIAGNOSIS — K760 Fatty (change of) liver, not elsewhere classified: Secondary | ICD-10-CM | POA: Diagnosis present

## 2022-09-29 DIAGNOSIS — M009 Pyogenic arthritis, unspecified: Secondary | ICD-10-CM

## 2022-09-29 DIAGNOSIS — Z86718 Personal history of other venous thrombosis and embolism: Secondary | ICD-10-CM | POA: Diagnosis not present

## 2022-09-29 DIAGNOSIS — E1169 Type 2 diabetes mellitus with other specified complication: Secondary | ICD-10-CM | POA: Diagnosis present

## 2022-09-29 DIAGNOSIS — E1142 Type 2 diabetes mellitus with diabetic polyneuropathy: Secondary | ICD-10-CM

## 2022-09-29 DIAGNOSIS — L089 Local infection of the skin and subcutaneous tissue, unspecified: Secondary | ICD-10-CM | POA: Diagnosis not present

## 2022-09-29 DIAGNOSIS — E119 Type 2 diabetes mellitus without complications: Secondary | ICD-10-CM

## 2022-09-29 DIAGNOSIS — Z800 Family history of malignant neoplasm of digestive organs: Secondary | ICD-10-CM | POA: Diagnosis not present

## 2022-09-29 DIAGNOSIS — E1140 Type 2 diabetes mellitus with diabetic neuropathy, unspecified: Secondary | ICD-10-CM | POA: Diagnosis present

## 2022-09-29 DIAGNOSIS — Z89511 Acquired absence of right leg below knee: Secondary | ICD-10-CM | POA: Diagnosis not present

## 2022-09-29 DIAGNOSIS — L723 Sebaceous cyst: Secondary | ICD-10-CM | POA: Diagnosis present

## 2022-09-29 DIAGNOSIS — Z7901 Long term (current) use of anticoagulants: Secondary | ICD-10-CM | POA: Diagnosis not present

## 2022-09-29 DIAGNOSIS — L03119 Cellulitis of unspecified part of limb: Secondary | ICD-10-CM | POA: Diagnosis present

## 2022-09-29 DIAGNOSIS — Z96643 Presence of artificial hip joint, bilateral: Secondary | ICD-10-CM | POA: Diagnosis present

## 2022-09-29 DIAGNOSIS — L89624 Pressure ulcer of left heel, stage 4: Secondary | ICD-10-CM | POA: Diagnosis present

## 2022-09-29 DIAGNOSIS — Z79899 Other long term (current) drug therapy: Secondary | ICD-10-CM | POA: Diagnosis not present

## 2022-09-29 DIAGNOSIS — Z8774 Personal history of (corrected) congenital malformations of heart and circulatory system: Secondary | ICD-10-CM | POA: Diagnosis not present

## 2022-09-29 DIAGNOSIS — L02612 Cutaneous abscess of left foot: Secondary | ICD-10-CM

## 2022-09-29 DIAGNOSIS — L8990 Pressure ulcer of unspecified site, unspecified stage: Secondary | ICD-10-CM | POA: Insufficient documentation

## 2022-09-29 DIAGNOSIS — M160 Bilateral primary osteoarthritis of hip: Secondary | ICD-10-CM | POA: Diagnosis present

## 2022-09-29 HISTORY — PX: AMPUTATION: SHX166

## 2022-09-29 LAB — BASIC METABOLIC PANEL
Anion gap: 10 (ref 5–15)
BUN: 19 mg/dL (ref 6–20)
CO2: 25 mmol/L (ref 22–32)
Calcium: 8.2 mg/dL — ABNORMAL LOW (ref 8.9–10.3)
Chloride: 99 mmol/L (ref 98–111)
Creatinine, Ser: 0.84 mg/dL (ref 0.61–1.24)
GFR, Estimated: >60 mL/min (ref >60)
Glucose, Bld: 109 mg/dL — ABNORMAL HIGH (ref 70–99)
Potassium: 3.5 mmol/L (ref 3.5–5.1)
Sodium: 134 mmol/L — ABNORMAL LOW (ref 135–145)

## 2022-09-29 LAB — HIV ANTIBODY (ROUTINE TESTING W REFLEX): HIV Screen 4th Generation wRfx: NONREACTIVE

## 2022-09-29 LAB — TYPE AND SCREEN
ABO/RH(D): A POS
Antibody Screen: NEGATIVE

## 2022-09-29 LAB — CBC
HCT: 38.1 % — ABNORMAL LOW (ref 39.0–52.0)
Hemoglobin: 12.8 g/dL — ABNORMAL LOW (ref 13.0–17.0)
MCH: 31.1 pg (ref 26.0–34.0)
MCHC: 33.6 g/dL (ref 30.0–36.0)
MCV: 92.5 fL (ref 80.0–100.0)
Platelets: 314 10*3/uL (ref 150–400)
RBC: 4.12 MIL/uL — ABNORMAL LOW (ref 4.22–5.81)
RDW: 14.2 % (ref 11.5–15.5)
WBC: 16.6 10*3/uL — ABNORMAL HIGH (ref 4.0–10.5)
nRBC: 0 % (ref 0.0–0.2)

## 2022-09-29 LAB — PROTIME-INR
INR: 3.3 — ABNORMAL HIGH (ref 0.8–1.2)
Prothrombin Time: 33.5 seconds — ABNORMAL HIGH (ref 11.4–15.2)

## 2022-09-29 LAB — SURGICAL PCR SCREEN
MRSA, PCR: NEGATIVE
Staphylococcus aureus: POSITIVE — AB

## 2022-09-29 LAB — GLUCOSE, CAPILLARY
Glucose-Capillary: 195 mg/dL — ABNORMAL HIGH (ref 70–99)
Glucose-Capillary: 70 mg/dL (ref 70–99)
Glucose-Capillary: 97 mg/dL (ref 70–99)
Glucose-Capillary: 235 mg/dL — ABNORMAL HIGH (ref 70–99)
Glucose-Capillary: 243 mg/dL — ABNORMAL HIGH (ref 70–99)
Glucose-Capillary: 243 mg/dL — ABNORMAL HIGH (ref 70–99)

## 2022-09-29 LAB — LACTIC ACID, PLASMA: Lactic Acid, Venous: 1.2 mmol/L (ref 0.5–1.9)

## 2022-09-29 SURGERY — AMPUTATION BELOW KNEE
Anesthesia: Choice | Site: Knee | Laterality: Left

## 2022-09-29 MED ORDER — LACTATED RINGERS IV SOLN: INTRAVENOUS | Status: DC

## 2022-09-29 MED ORDER — MAGNESIUM CITRATE PO SOLN: 1.0000 | Freq: Once | ORAL | Status: DC | PRN

## 2022-09-29 MED ORDER — HYDROMORPHONE HCL 1 MG/ML IJ SOLN
1.0000 mg | INTRAMUSCULAR | Status: DC | PRN
  Administered 2022-09-29: 1 mg via INTRAVENOUS
  Filled 2022-09-29: qty 1

## 2022-09-29 MED ORDER — HYDROMORPHONE HCL 1 MG/ML IJ SOLN
0.5000 mg | INTRAMUSCULAR | Status: DC | PRN
  Administered 2022-09-30: 1 mg via INTRAVENOUS
  Filled 2022-09-29: qty 1

## 2022-09-29 MED ORDER — POTASSIUM CHLORIDE CRYS ER 20 MEQ PO TBCR: 20.0000 meq | EXTENDED_RELEASE_TABLET | Freq: Every day | ORAL | Status: DC | PRN

## 2022-09-29 MED ORDER — MAGNESIUM SULFATE 2 GM/50ML IV SOLN: 2.0000 g | Freq: Every day | INTRAVENOUS | Status: DC | PRN

## 2022-09-29 MED ORDER — ACETAMINOPHEN 500 MG PO TABS: 1000.0000 mg | ORAL_TABLET | Freq: Once | ORAL | Status: AC

## 2022-09-29 MED ORDER — FENTANYL CITRATE (PF) 250 MCG/5ML IJ SOLN
INTRAMUSCULAR | Status: AC
  Filled 2022-09-29: qty 5

## 2022-09-29 MED ORDER — SENNOSIDES-DOCUSATE SODIUM 8.6-50 MG PO TABS: 1.0000 | ORAL_TABLET | Freq: Every evening | ORAL | Status: DC | PRN

## 2022-09-29 MED ORDER — OXYCODONE HCL 5 MG PO TABS
10.0000 mg | ORAL_TABLET | ORAL | Status: DC | PRN
  Administered 2022-09-29 (×2): 15 mg via ORAL
  Administered 2022-09-29: 10 mg via ORAL
  Administered 2022-09-30 – 2022-10-03 (×17): 15 mg via ORAL
  Filled 2022-09-29 (×3): qty 3
  Filled 2022-09-29: qty 2
  Filled 2022-09-29 (×5): qty 3
  Filled 2022-09-29: qty 2
  Filled 2022-09-29 (×12): qty 3

## 2022-09-29 MED ORDER — OXYCODONE HCL 5 MG PO TABS: 5.0000 mg | ORAL_TABLET | ORAL | Status: DC | PRN

## 2022-09-29 MED ORDER — MIDAZOLAM HCL 2 MG/2ML IJ SOLN
INTRAMUSCULAR | Status: DC | PRN
  Administered 2022-09-29 (×2): 1 mg via INTRAVENOUS

## 2022-09-29 MED ORDER — DOCUSATE SODIUM 100 MG PO CAPS
100.0000 mg | ORAL_CAPSULE | Freq: Every day | ORAL | Status: DC
  Administered 2022-09-30 – 2022-10-03 (×4): 100 mg via ORAL
  Filled 2022-09-29 (×4): qty 1

## 2022-09-29 MED ORDER — METOPROLOL TARTRATE 5 MG/5ML IV SOLN: 2.0000 mg | INTRAVENOUS | Status: DC | PRN

## 2022-09-29 MED ORDER — ACETAMINOPHEN 650 MG RE SUPP: 650.0000 mg | Freq: Four times a day (QID) | RECTAL | Status: DC | PRN

## 2022-09-29 MED ORDER — ZINC SULFATE 220 (50 ZN) MG PO CAPS
220.0000 mg | ORAL_CAPSULE | Freq: Every day | ORAL | Status: DC
  Administered 2022-09-29 – 2022-10-03 (×5): 220 mg via ORAL
  Filled 2022-09-29 (×5): qty 1

## 2022-09-29 MED ORDER — PROPOFOL 1000 MG/100ML IV EMUL
INTRAVENOUS | Status: AC
  Filled 2022-09-29: qty 100

## 2022-09-29 MED ORDER — CHLORHEXIDINE GLUCONATE 0.12 % MT SOLN
OROMUCOSAL | Status: AC
  Administered 2022-09-29: 15 mL via OROMUCOSAL
  Filled 2022-09-29: qty 15

## 2022-09-29 MED ORDER — EMPAGLIFLOZIN 25 MG PO TABS
25.0000 mg | ORAL_TABLET | Freq: Every day | ORAL | Status: DC
  Administered 2022-09-29 – 2022-10-03 (×5): 25 mg via ORAL
  Filled 2022-09-29 (×6): qty 1

## 2022-09-29 MED ORDER — PHENYLEPHRINE HCL-NACL 20-0.9 MG/250ML-% IV SOLN
INTRAVENOUS | Status: DC | PRN
  Administered 2022-09-29: 20 ug/min via INTRAVENOUS

## 2022-09-29 MED ORDER — GABAPENTIN 300 MG PO CAPS
600.0000 mg | ORAL_CAPSULE | Freq: Two times a day (BID) | ORAL | Status: DC
  Administered 2022-09-29 – 2022-10-01 (×5): 600 mg via ORAL
  Filled 2022-09-29 (×5): qty 2

## 2022-09-29 MED ORDER — VITAMIN C 500 MG PO TABS
1000.0000 mg | ORAL_TABLET | Freq: Every day | ORAL | Status: DC
  Administered 2022-09-29 – 2022-10-03 (×5): 1000 mg via ORAL
  Filled 2022-09-29 (×5): qty 2

## 2022-09-29 MED ORDER — METFORMIN HCL ER 500 MG PO TB24: 1500.0000 mg | ORAL_TABLET | Freq: Every day | ORAL | Status: DC

## 2022-09-29 MED ORDER — SODIUM CHLORIDE 0.9 % IV SOLN: INTRAVENOUS | Status: DC

## 2022-09-29 MED ORDER — ONDANSETRON HCL 4 MG/2ML IJ SOLN
INTRAMUSCULAR | Status: DC | PRN
  Administered 2022-09-29: 4 mg via INTRAVENOUS

## 2022-09-29 MED ORDER — PHENOL 1.4 % MT LIQD: 1.0000 | OROMUCOSAL | Status: DC | PRN

## 2022-09-29 MED ORDER — POLYETHYLENE GLYCOL 3350 17 G PO PACK: 17.0000 g | PACK | Freq: Every day | ORAL | Status: DC | PRN

## 2022-09-29 MED ORDER — FENTANYL CITRATE PF 50 MCG/ML IJ SOSY
50.0000 ug | PREFILLED_SYRINGE | Freq: Once | INTRAMUSCULAR | Status: AC
  Administered 2022-09-29: 50 ug via INTRAVENOUS
  Filled 2022-09-29: qty 1

## 2022-09-29 MED ORDER — PROPOFOL 10 MG/ML IV BOLUS
INTRAVENOUS | Status: AC
  Filled 2022-09-29: qty 20

## 2022-09-29 MED ORDER — ALUM & MAG HYDROXIDE-SIMETH 200-200-20 MG/5ML PO SUSP: 15.0000 mL | ORAL | Status: DC | PRN

## 2022-09-29 MED ORDER — FENTANYL CITRATE (PF) 250 MCG/5ML IJ SOLN
INTRAMUSCULAR | Status: DC | PRN
  Administered 2022-09-29 (×2): 50 ug via INTRAVENOUS

## 2022-09-29 MED ORDER — GUAIFENESIN-DM 100-10 MG/5ML PO SYRP
15.0000 mL | ORAL_SOLUTION | ORAL | Status: DC | PRN
  Administered 2022-10-02: 15 mL via ORAL
  Filled 2022-09-29: qty 20

## 2022-09-29 MED ORDER — LABETALOL HCL 5 MG/ML IV SOLN: 10.0000 mg | INTRAVENOUS | Status: DC | PRN

## 2022-09-29 MED ORDER — BISACODYL 5 MG PO TBEC: 5.0000 mg | DELAYED_RELEASE_TABLET | Freq: Every day | ORAL | Status: DC | PRN

## 2022-09-29 MED ORDER — PROMETHAZINE HCL 25 MG/ML IJ SOLN: 6.2500 mg | INTRAMUSCULAR | Status: DC | PRN

## 2022-09-29 MED ORDER — PIOGLITAZONE HCL 30 MG PO TABS
30.0000 mg | ORAL_TABLET | Freq: Every day | ORAL | Status: DC
  Administered 2022-09-29 – 2022-10-03 (×5): 30 mg via ORAL
  Filled 2022-09-29 (×6): qty 1

## 2022-09-29 MED ORDER — ONDANSETRON HCL 4 MG/2ML IJ SOLN
4.0000 mg | Freq: Four times a day (QID) | INTRAMUSCULAR | Status: DC | PRN
  Administered 2022-10-01: 4 mg via INTRAVENOUS
  Filled 2022-09-29: qty 2

## 2022-09-29 MED ORDER — 0.9 % SODIUM CHLORIDE (POUR BTL) OPTIME
TOPICAL | Status: DC | PRN
  Administered 2022-09-29: 1000 mL

## 2022-09-29 MED ORDER — MIDAZOLAM HCL 2 MG/2ML IJ SOLN
INTRAMUSCULAR | Status: AC
  Filled 2022-09-29: qty 2

## 2022-09-29 MED ORDER — ONDANSETRON HCL 4 MG/2ML IJ SOLN
INTRAMUSCULAR | Status: AC
  Filled 2022-09-29: qty 2

## 2022-09-29 MED ORDER — PANTOPRAZOLE SODIUM 40 MG PO TBEC
40.0000 mg | DELAYED_RELEASE_TABLET | Freq: Every day | ORAL | Status: DC
  Administered 2022-09-29 – 2022-10-03 (×5): 40 mg via ORAL
  Filled 2022-09-29 (×5): qty 1

## 2022-09-29 MED ORDER — HYDRALAZINE HCL 20 MG/ML IJ SOLN: 5.0000 mg | INTRAMUSCULAR | Status: DC | PRN

## 2022-09-29 MED ORDER — WARFARIN SODIUM 3 MG PO TABS
3.0000 mg | ORAL_TABLET | Freq: Once | ORAL | Status: AC
  Administered 2022-09-29: 3 mg via ORAL
  Filled 2022-09-29: qty 1

## 2022-09-29 MED ORDER — WARFARIN - PHARMACIST DOSING INPATIENT: Freq: Every day | Status: DC

## 2022-09-29 MED ORDER — CHLORHEXIDINE GLUCONATE 0.12 % MT SOLN: 15.0000 mL | Freq: Once | OROMUCOSAL | Status: AC

## 2022-09-29 MED ORDER — ORAL CARE MOUTH RINSE: 15.0000 mL | Freq: Once | OROMUCOSAL | Status: AC

## 2022-09-29 MED ORDER — ACETAMINOPHEN 325 MG PO TABS
325.0000 mg | ORAL_TABLET | Freq: Four times a day (QID) | ORAL | Status: DC | PRN
  Administered 2022-09-30 – 2022-10-03 (×2): 650 mg via ORAL
  Filled 2022-09-29: qty 2

## 2022-09-29 MED ORDER — METFORMIN HCL ER 750 MG PO TB24
750.0000 mg | ORAL_TABLET | Freq: Two times a day (BID) | ORAL | Status: DC
  Administered 2022-09-29 – 2022-10-03 (×8): 750 mg via ORAL
  Filled 2022-09-29 (×11): qty 1

## 2022-09-29 MED ORDER — ROSUVASTATIN CALCIUM 20 MG PO TABS
20.0000 mg | ORAL_TABLET | Freq: Every day | ORAL | Status: DC
  Administered 2022-09-29 – 2022-10-03 (×5): 20 mg via ORAL
  Filled 2022-09-29 (×5): qty 1

## 2022-09-29 MED ORDER — ACETAMINOPHEN 500 MG PO TABS
ORAL_TABLET | ORAL | Status: AC
  Administered 2022-09-29: 1000 mg via ORAL
  Filled 2022-09-29: qty 2

## 2022-09-29 MED ORDER — PROPOFOL 500 MG/50ML IV EMUL
INTRAVENOUS | Status: DC | PRN
  Administered 2022-09-29: 75 ug/kg/min via INTRAVENOUS

## 2022-09-29 MED ORDER — INSULIN ASPART 100 UNIT/ML IJ SOLN
0.0000 [IU] | INTRAMUSCULAR | Status: DC
  Administered 2022-09-29 (×2): 3 [IU] via SUBCUTANEOUS
  Administered 2022-09-29: 2 [IU] via SUBCUTANEOUS
  Administered 2022-09-29: 3 [IU] via SUBCUTANEOUS
  Administered 2022-09-30 (×2): 2 [IU] via SUBCUTANEOUS
  Administered 2022-09-30: 1 [IU] via SUBCUTANEOUS
  Administered 2022-09-30: 2 [IU] via SUBCUTANEOUS
  Administered 2022-10-01 (×2): 1 [IU] via SUBCUTANEOUS
  Administered 2022-10-01 (×2): 2 [IU] via SUBCUTANEOUS
  Administered 2022-10-02: 3 [IU] via SUBCUTANEOUS
  Administered 2022-10-02: 2 [IU] via SUBCUTANEOUS
  Administered 2022-10-02: 1 [IU] via SUBCUTANEOUS
  Administered 2022-10-02: 3 [IU] via SUBCUTANEOUS
  Administered 2022-10-02 (×2): 1 [IU] via SUBCUTANEOUS

## 2022-09-29 MED ORDER — SODIUM CHLORIDE 0.9 % IV SOLN
2.0000 g | Freq: Once | INTRAVENOUS | Status: AC
  Administered 2022-09-29: 2 g via INTRAVENOUS
  Filled 2022-09-29: qty 12.5

## 2022-09-29 MED ORDER — DEXTROSE 50 % IV SOLN
INTRAVENOUS | Status: DC | PRN
  Administered 2022-09-29: .25 via INTRAVENOUS

## 2022-09-29 MED ORDER — LIDOCAINE 2% (20 MG/ML) 5 ML SYRINGE
INTRAMUSCULAR | Status: AC
  Filled 2022-09-29: qty 5

## 2022-09-29 MED ORDER — INSULIN ASPART 100 UNIT/ML IJ SOLN: 0.0000 [IU] | INTRAMUSCULAR | Status: DC | PRN

## 2022-09-29 MED ORDER — ACETAMINOPHEN 325 MG PO TABS: 650.0000 mg | ORAL_TABLET | Freq: Four times a day (QID) | ORAL | Status: DC | PRN

## 2022-09-29 MED ORDER — PREDNISOLONE ACETATE 1 % OP SUSP
1.0000 [drp] | Freq: Every evening | OPHTHALMIC | Status: DC
  Administered 2022-09-29 – 2022-10-02 (×4): 1 [drp] via OPHTHALMIC
  Filled 2022-09-29: qty 5

## 2022-09-29 MED ORDER — PHENYLEPHRINE HCL-NACL 20-0.9 MG/250ML-% IV SOLN
INTRAVENOUS | Status: AC
  Filled 2022-09-29: qty 250

## 2022-09-29 MED ORDER — VANCOMYCIN HCL IN DEXTROSE 1-5 GM/200ML-% IV SOLN
1000.0000 mg | Freq: Once | INTRAVENOUS | Status: AC
  Administered 2022-09-29: 1000 mg via INTRAVENOUS
  Filled 2022-09-29: qty 200

## 2022-09-29 MED ORDER — DEXTROSE 50 % IV SOLN
INTRAVENOUS | Status: AC
  Filled 2022-09-29: qty 50

## 2022-09-29 MED ORDER — JUVEN PO PACK
1.0000 | PACK | Freq: Two times a day (BID) | ORAL | Status: DC
  Administered 2022-09-29 – 2022-10-03 (×9): 1 via ORAL
  Filled 2022-09-29 (×9): qty 1

## 2022-09-29 SURGICAL SUPPLY — 42 items
BAG COUNTER SPONGE SURGICOUNT (BAG) IMPLANT
BIT DRILL 3.2XOCPTL (BIT) ×1 IMPLANT
BIT DRL 3.2XOCPTL (BIT) ×1
BLADE SAW RECIP 87.9 MT (BLADE) ×1 IMPLANT
BLADE SURG 21 STRL SS (BLADE) ×1 IMPLANT
BNDG COHESIVE 6X5 TAN ST LF (GAUZE/BANDAGES/DRESSINGS) IMPLANT
CANISTER WOUND CARE 500ML ATS (WOUND CARE) ×1 IMPLANT
COVER SURGICAL LIGHT HANDLE (MISCELLANEOUS) ×1 IMPLANT
CUFF TOURN SGL QUICK 34 (TOURNIQUET CUFF) ×1
CUFF TRNQT CYL 34X4.125X (TOURNIQUET CUFF) ×1 IMPLANT
DRAPE DERMATAC (DRAPES) IMPLANT
DRAPE INCISE IOBAN 66X45 STRL (DRAPES) ×1 IMPLANT
DRAPE U-SHAPE 47X51 STRL (DRAPES) ×1 IMPLANT
DRESSING PREVENA PLUS CUSTOM (GAUZE/BANDAGES/DRESSINGS) ×1 IMPLANT
DRILL BIT (BIT) ×1
DRSG PREVENA PLUS CUSTOM (GAUZE/BANDAGES/DRESSINGS) ×1
DURAPREP 26ML APPLICATOR (WOUND CARE) ×1 IMPLANT
ELECT REM PT RETURN 9FT ADLT (ELECTROSURGICAL) ×1
ELECTRODE REM PT RTRN 9FT ADLT (ELECTROSURGICAL) ×1 IMPLANT
GLOVE BIOGEL PI IND STRL 9 (GLOVE) ×1 IMPLANT
GLOVE SURG ORTHO 9.0 STRL STRW (GLOVE) ×1 IMPLANT
GOWN STRL REUS W/ TWL XL LVL3 (GOWN DISPOSABLE) ×2 IMPLANT
GOWN STRL REUS W/TWL XL LVL3 (GOWN DISPOSABLE) ×2
GRAFT SKIN WND MICRO 38 (Tissue) IMPLANT
GRAFT SKIN WND OMEGA3 SB 7X10 (Tissue) IMPLANT
KIT BASIN OR (CUSTOM PROCEDURE TRAY) ×1 IMPLANT
KIT TURNOVER KIT B (KITS) ×1 IMPLANT
MANIFOLD NEPTUNE II (INSTRUMENTS) ×1 IMPLANT
NS IRRIG 1000ML POUR BTL (IV SOLUTION) ×1 IMPLANT
PACK ORTHO EXTREMITY (CUSTOM PROCEDURE TRAY) ×1 IMPLANT
PAD ARMBOARD 7.5X6 YLW CONV (MISCELLANEOUS) ×1 IMPLANT
PREVENA RESTOR ARTHOFORM 46X30 (CANNISTER) ×1 IMPLANT
SPONGE T-LAP 18X18 ~~LOC~~+RFID (SPONGE) IMPLANT
STAPLER VISISTAT 35W (STAPLE) IMPLANT
STOCKINETTE IMPERVIOUS LG (DRAPES) ×1 IMPLANT
SUT ETHILON 2 0 PSLX (SUTURE) IMPLANT
SUT SILK 2 0 (SUTURE) ×1
SUT SILK 2-0 18XBRD TIE 12 (SUTURE) ×1 IMPLANT
SUT VIC AB 1 CTX 27 (SUTURE) ×2 IMPLANT
TOWEL GREEN STERILE (TOWEL DISPOSABLE) ×1 IMPLANT
TUBE CONNECTING 12X1/4 (SUCTIONS) ×1 IMPLANT
YANKAUER SUCT BULB TIP NO VENT (SUCTIONS) ×1 IMPLANT

## 2022-09-29 NOTE — Transfer of Care (Signed)
Immediate Anesthesia Transfer of Care Note  Patient: Gregory May  Procedure(s) Performed: AMPUTATION BELOW KNEE (Left: Knee)  Patient Location: PACU  Anesthesia Type:MAC combined with regional for post-op pain  Level of Consciousness: awake, alert , and oriented  Airway & Oxygen Therapy: Patient Spontanous Breathing and Patient connected to nasal cannula oxygen  Post-op Assessment: Report given to RN and Post -op Vital signs reviewed and stable  Post vital signs: Reviewed and stable  Last Vitals:  Vitals Value Taken Time  BP 97/61 09/29/22 0848  Temp 36.6 C 09/29/22 0848  Pulse 66 09/29/22 0855  Resp 12 09/29/22 0855  SpO2 96 % 09/29/22 0855  Vitals shown include unvalidated device data.  Last Pain:  Vitals:   09/29/22 0659  TempSrc:   PainSc: 10-Worst pain ever      Patients Stated Pain Goal: 0 ([id number])  Complications: No notable events documented.

## 2022-09-29 NOTE — Progress Notes (Addendum)
HD#0 Subjective:   Summary: Gregory May is a 62 y.o. 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   Overnight Events: Admitted overnight please see H&P.  Patient is doing well after surgery and has had intermittent feeling of something digging into his left stump but cannot pinpoint exactly where.  When I went to see him he was not having this issue but we discussed that he should tell his nurse if he is having it again.  Otherwise he is doing well without any complaints.  Objective:  Vital signs in last 24 hours: Vitals:   09/29/22 0900 09/29/22 0914 09/29/22 0915 09/29/22 0922  BP: (!) 90/56  (!) 87/61 (!) 104/52  Pulse: 63 65 64 (!) 58  Resp: 13 12 14 12  $ Temp:  98.1 F (36.7 C)    TempSrc:      SpO2: 93% 95% 99% 98%   Supplemental O2: Room Air SpO2: 98 % O2 Flow Rate (L/min): 2 L/min   Physical Exam:  Constitutional: Elderly male laying in bed, in no acute distress Cardiovascular: regular rate and rhythm Pulmonary/Chest: normal work of breathing on room air MSK: Stable right BKA with stump sock and sleeve on.  S/p left BKA with bandage sock and splint overlying Neurological: alert & oriented x 3, No focal deficits noted Skin: warm and dry   Intake/Output Summary (Last 24 hours) at 09/29/2022 1251 Last data filed at 09/29/2022 1159 Gross per 24 hour  Intake 1039.3 ml  Output 950 ml  Net 89.3 ml   Net IO Since Admission: 89.3 mL [09/29/22 1251]  Pertinent Labs:    Latest Ref Rng & Units 09/29/2022    5:52 AM 09/28/2022   10:26 PM 02/19/2022   12:40 PM  CBC  WBC 4.0 - 10.5 K/uL 16.6  19.2  8.3   Hemoglobin 13.0 - 17.0 g/dL 12.8  14.0  15.8   Hematocrit 39.0 - 52.0 % 38.1  43.3  45.5    Platelets 150 - 400 K/uL 314  354  291        Latest Ref Rng & Units 09/29/2022    5:52 AM 09/28/2022   10:26 PM 09/24/2022    3:17 PM  CMP  Glucose 70 - 99 mg/dL 109  179  83   BUN 6 - 20 mg/dL 19  24  19   $ Creatinine 0.61 - 1.24 mg/dL 0.84  0.92  0.86   Sodium 135 - 145 mmol/L 134  135  138   Potassium 3.5 - 5.1 mmol/L 3.5  3.8  4.0   Chloride 98 - 111 mmol/L 99  99  101   CO2 22 - 32 mmol/L 25  24  29   $ Calcium 8.9 - 10.3 mg/dL 8.2  8.6  9.6   Total Protein 6.5 - 8.1 g/dL  7.0  7.2   Total Bilirubin 0.3 - 1.2 mg/dL  0.3  0.3   Alkaline Phos 38 - 126 U/L  78  60   AST 15 - 41 U/L  40  28   ALT 0 - 44 U/L  36  25     Assessment/Plan:   Principal Problem:   Lower extremity cellulitis Active Problems:   Cutaneous abscess of left foot  Acute osteomyelitis of left foot (HCC)   Pressure injury of skin   Patient Summary: Gregory May is a 62 y.o. male with pertinent PMH of type 2 diabetes with neuropathy, prior DVT and retinal artery occlusion on warfarin, hepatic steatosis, and prior right BKA presenting with fever and left lower extremity pain associated with left heel ulcer and is admitted for left lower extremity cellulitis s/p left BKA 09/29/2022, on hospital day 0   Left lower extremity ascending cellulitis S/p left BKA 09/29/2022 Patient is doing overall well after surgery but did have some sensation of possibly the splint pushing into his leg that is intermittent.  Informed Dr. Sharol Given of this who recommends taking off the splint and removing the sock but not adjusting the wound VAC.  Relayed this information to nursing and if he is having this issue further they will remove the splint and sock. - DC antibiotics and monitor for fever - Pain regimen per Ortho - Bowel regimen  Type 2 diabetes with neuropathy S/p right BKA and right index finger transphalangeal amputation A1c 6.9 on 09/27/2022.  Has been recommended to use [place] 6 units daily as well as to get a  freestyle libre.  Will monitor sugars and if uncontrolled will add this. - Resume home pioglitazone 30 mg daily, Jardiance 25 mg daily, and metformin 750 mg twice daily - SSI  History of central retinal artery occlusion Previous DVTs Chronic warfarin therapy INR 3.3 this morning prior to surgery.  INR goal of 2-3. - Resume warfarin with dosing per pharmacy  Hyperlipidemia Crestor 20 daily  Diet: Carb-Modified IVF: NS,75cc/hr VTE: None, will resume warfarin  Code: Full PT/OT recs: Pending, . Family Update: updated in room   Dispo: Pending PT evaluation and continued postop healing  Johny Blamer, DO Internal Medicine Resident PGY-1 Pager: [phone]  Please contact the on call pager after 5 pm and on weekends at [phone].

## 2022-09-29 NOTE — Anesthesia Postprocedure Evaluation (Signed)
Anesthesia Post Note  Patient: Gregory May  Procedure(s) Performed: AMPUTATION BELOW KNEE (Left: Knee)     Patient location during evaluation: PACU Anesthesia Type: Regional Level of consciousness: awake and alert Pain management: pain level controlled Vital Signs Assessment: post-procedure vital signs reviewed and stable Respiratory status: spontaneous breathing Cardiovascular status: stable Anesthetic complications: no  No notable events documented.  Last Vitals:  Vitals:   09/29/22 0915 09/29/22 0922  BP: (!) 87/61 (!) 104/52  Pulse: 64 (!) 58  Resp: 14 12  Temp:    SpO2: 99% 98%    Last Pain:  Vitals:   09/29/22 0915  TempSrc:   PainSc: 0-No pain                 Nolon Nations

## 2022-09-29 NOTE — Plan of Care (Signed)

## 2022-09-29 NOTE — ED Notes (Signed)
ED TO INPATIENT HANDOFF REPORT  ED Nurse Name and Phone #: Monia Pouch [phone]  S Name/Age/Gender Gregory May 62 y.o. male Room/Bed: 016C/016C  Code Status   Code Status: Full Code  Home/SNF/Other Home Patient oriented to: Oriented x 4 Is this baseline? Yes   Triage Complete: Triage complete  Chief Complaint Lower extremity cellulitis [id number]  Triage Note Patient is scheduled for left BKA surgery tomorrow morning , reports pain at left thigh and fever this afternoon .    Allergies No Known Allergies  Level of Care/Admitting Diagnosis ED Disposition     ED Disposition  Admit   Condition  --   Comment  Hospital Area: [facility] [[id number]]  Level of Care: Med-Surg [[id number]]  May admit patient to [facility] or [facility] if equivalent level of care is available:: No  Covid Evaluation: Asymptomatic - no recent exposure (last 10 days) testing not required  Diagnosis: Lower extremity cellulitis [id number]  Admitting Physician: Sid Falcon [[id number]]  Attending Physician: Sid Falcon [id number]  Certification:: I certify this patient will need inpatient services for at least 2 midnights  Estimated Length of Stay: 2          B Medical/Surgery History Past Medical History:  Diagnosis Date   Arthritis    bilateral hips   Cutaneous abscess of left foot    Deep vein thrombosis (DVT) ([place])    Diabetes mellitus    type II   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 Date of diagnosis February 08 2014 Duration of anticoagulation: 6 months. End date 08/12/2014.  Anticoagulant: Lovenox 120 units daily Switched to Eliquis on 05/25/2014      Ear drum perforation, right 03/06/2019   Non-pressure chronic ulcer of right calf, limited to breakdown of skin ([place]) 04/17/2017   Pulmonary emboli ([place]) 02/08/2014   Date of diagnosis February 08 2014, on chest CTA  Hospitalized for 3 days Had some symptoms of shortness of breath, and chest pain With intercurrent DVT of the left LE. Duration of anticoagulation: 8 months. End date 10/11/2014.  Anticoagulant: Lovenox 120 units daily Switched to Eliquis on 05/25/2014 per patient preference    Pulmonary embolism ([place])    Sebaceous cyst    on back of neck   Past Surgical History:  Procedure Laterality Date   AMPUTATION Right 06/03/2015   Procedure: Right Below Knee Amputation;  Surgeon: [name], MD;  Location: [facility];  Service: Orthopedics;  Laterality: Right;   AMPUTATION Left 07/24/2019   Procedure: LEFT FOOT 3RD RAY AMPUTATION;  Surgeon: [name], MD;  Location: [facility];  Service: Orthopedics;  Laterality: Left;   AMPUTATION Left 06/23/2021   Procedure: LEFT 2ND TOE AMPUTATION;  Surgeon: [name], MD;  Location: [facility];  Service: Orthopedics;  Laterality: Left;   AMPUTATION Right 08/21/2021   Procedure: AMPUTATION RIGHT INDEX FINGER;  Surgeon: Orene Desanctis, MD;  Location: [facility];  Service: Orthopedics;  Laterality: Right;   BUBBLE STUDY  01/24/2022   Procedure: BUBBLE STUDY;  Surgeon: Josue Hector, MD;  Location: [facility];  Service: Cardiovascular;;   CLOSED REDUCTION WITH HUMER PIN INSERTION  1974   left hip   HARDWARE REMOVAL Left 07/21/2014   Procedure: HARDWARE REMOVAL;  Surgeon: Ninetta Lights, MD;  Location: [facility];  Service: Orthopedics;  Laterality: Left;  I & D EXTREMITY Right 08/21/2021   Procedure: IRRIGATION AND DEBRIDEMENT RIGHT INDEX FINGER;  Surgeon: Orene Desanctis, MD;  Location: [facility];  Service: Orthopedics;  Laterality: Right;   PATENT FORAMEN OVALE(PFO) CLOSURE N/A 02/28/2022   Procedure: PATENT FORAMEN OVALE(PFO) CLOSURE;  Surgeon: Sherren Mocha, MD;  Location: [facility];  Service: Cardiovascular;  Laterality: N/A;   ROTATOR CUFF REPAIR Right 2005 (approx)   TEE WITHOUT CARDIOVERSION N/A 01/24/2022   Procedure: TRANSESOPHAGEAL ECHOCARDIOGRAM (TEE);  Surgeon:  Josue Hector, MD;  Location[facility];  Service: Cardiovascular;  Laterality: N/A;   TOTAL HIP ARTHROPLASTY Left 07/21/2014   Procedure: TOTAL HIP ARTHROPLASTY ANTERIOR APPROACH;  Surgeon: Ninetta Lights, MD;  Location: [facility];  Service: Orthopedics;  Laterality: Left;   TOTAL HIP ARTHROPLASTY Right 2006 (approx)   right hip replaced     A IV Location/Drains/Wounds Patient Lines/Drains/Airways Status     Active Line/Drains/Airways     Name Placement date Placement time Site Days   Peripheral IV 09/29/22 20 G Left Antecubital 09/29/22  0121  Antecubital  less than 1   Incision (Closed) 07/21/14 Hip Left 07/21/14  1041  -- 2992   Incision (Closed) 06/03/15 Leg Right 06/03/15  1517  -- 2675   Incision (Closed) 07/24/19 Leg Left 07/24/19  1124  -- 1163   Incision (Closed) 06/23/21 Leg Left 06/23/21  1343  -- 463   Incision (Closed) 08/21/21 Hand Right 08/21/21  1458  -- 404            Intake/Output Last 24 hours  Intake/Output Summary (Last 24 hours) at 09/29/2022 0326 Last data filed at 09/29/2022 [id number] Gross per 24 hour  Intake 136.37 ml  Output --  Net 136.37 ml    Labs/Imaging Results for orders placed or performed during the hospital encounter of 09/28/22 (from the past 48 hour(s))  CBC with Differential     Status: Abnormal   Collection Time: 09/28/22 10:26 PM  Result Value Ref Range   WBC 19.2 (H) 4.0 - 10.5 K/uL   RBC 4.57 4.22 - 5.81 MIL/uL   Hemoglobin 14.0 13.0 - 17.0 g/dL   HCT 43.3 39.0 - 52.0 %   MCV 94.7 80.0 - 100.0 fL   MCH 30.6 26.0 - 34.0 pg   MCHC 32.3 30.0 - 36.0 g/dL   RDW 14.3 11.5 - 15.5 %   Platelets 354 150 - 400 K/uL   nRBC 0.0 0.0 - 0.2 %   Neutrophils Relative % 86 %   Neutro Abs 16.3 (H) 1.7 - 7.7 K/uL   Lymphocytes Relative 5 %   Lymphs Abs 1.0 0.7 - 4.0 K/uL   Monocytes Relative 8 %   Monocytes Absolute 1.6 (H) 0.1 - 1.0 K/uL   Eosinophils Relative 0 %   Eosinophils Absolute 0.0 0.0 - 0.5 K/uL   Basophils Relative 0 %    Basophils Absolute 0.0 0.0 - 0.1 K/uL   Immature Granulocytes 1 %   Abs Immature Granulocytes 0.15 (H) 0.00 - 0.07 K/uL    Comment: Performed at [facility], [address], [place], [place] [address]  Comprehensive metabolic panel     Status: Abnormal   Collection Time: 09/28/22 10:26 PM  Result Value Ref Range   Sodium 135 135 - 145 mmol/L   Potassium 3.8 3.5 - 5.1 mmol/L   Chloride 99 98 - 111 mmol/L   CO2 24 22 - 32 mmol/L   Glucose, Bld 179 (H) 70 - 99 mg/dL  Comment: Glucose reference range applies only to samples taken after fasting for at least 8 hours.   BUN 24 (H) 6 - 20 mg/dL   Creatinine, Ser 0.92 0.61 - 1.24 mg/dL   Calcium 8.6 (L) 8.9 - 10.3 mg/dL   Total Protein 7.0 6.5 - 8.1 g/dL   Albumin 2.8 (L) 3.5 - 5.0 g/dL   AST 40 15 - 41 U/L   ALT 36 0 - 44 U/L   Alkaline Phosphatase 78 38 - 126 U/L   Total Bilirubin 0.3 0.3 - 1.2 mg/dL   GFR, Estimated >60 >60 mL/min    Comment: (NOTE) Calculated using the CKD-EPI Creatinine Equation (2021)    Anion gap 12 5 - 15    Comment: Performed at [facility], [place] [address], [place], [place] [address]  Lactic acid, plasma     Status: None   Collection Time: 09/28/22 10:26 PM  Result Value Ref Range   Lactic Acid, Venous 1.6 0.5 - 1.9 mmol/L    Comment: Performed at [facility] [address], [place], [place] [address]  Protime-INR     Status: Abnormal   Collection Time: 09/28/22 10:26 PM  Result Value Ref Range   Prothrombin Time 29.7 (H) 11.4 - 15.2 seconds   INR 2.9 (H) 0.8 - 1.2    Comment: (NOTE) INR goal varies based on device and disease states. Performed at [facility], [place] [address] [place], [place] [address]   Lactic acid, plasma     Status: None   Collection Time: 09/29/22  1:16 AM  Result Value Ref Range   Lactic Acid, Venous 1.2 0.5 - 1.9 mmol/L    Comment: Performed at [facility] [address] [place], [place] [address]   DG Foot Complete Left  Result Date:  09/28/2022 CLINICAL DATA:  Concern for osteomyelitis. EXAM: LEFT FOOT - COMPLETE 3+ VIEW COMPARISON:  Left foot radiograph dated 04/18/2021. FINDINGS: Amputation of the second phalanges and midportion of the third metatarsal. There is a minimally displaced fracture of the base of the distal phalanx of the fourth digit, new since the prior radiograph. Acute osteomyelitis is not excluded. MRI or a white blood cell nuclear scan may provide better evaluation. The bones are osteopenic. There is hallux valgus. The soft tissues are unremarkable. Vascular calcifications noted. IMPRESSION: Minimally displaced fracture of the base of the distal phalanx of the fourth digit. Acute osteomyelitis is not excluded. Electronically Signed   By: Anner Crete M.D.   On: 09/28/2022 22:40    Pending Labs Unresulted Labs (From admission, onward)     Start     Ordered   09/29/22 0500  HIV Antibody (routine testing w rflx)  (HIV Antibody (Routine testing w reflex) panel)  Once,   R        09/29/22 0311   09/29/22 0500  CBC  Tomorrow morning,   R        09/29/22 0311   09/29/22 [id number]  Basic metabolic panel  Tomorrow morning,   R        09/29/22 0311   09/29/22 0500  Protime-INR  Tomorrow morning,   R        09/29/22 0311   09/29/22 0159  Type and screen [place]  Once,   STAT       Comments: [place]    09/29/22 0158   09/28/22 2210  Blood culture (routine x 2)  BLOOD  CULTURE X 2,   R (with STAT occurrences)      09/28/22 2210            Vitals/Pain Today's Vitals   09/28/22 2141 09/29/22 0115 09/29/22 0120 09/29/22 0257  BP:  (!) 150/71 (!) 157/90 121/64  Pulse:  71 80 74  Resp:  20 16 16  $ Temp:   98.6 F (37 C)   SpO2:  97% 95% 96%  PainSc: 6        Isolation Precautions No active isolations  Medications Medications  vancomycin (VANCOCIN) IVPB 1000 mg/200 mL premix (1,000 mg Intravenous New Bag/Given 09/29/22 0256)  acetaminophen (TYLENOL) tablet 650 mg  (has no administration in time range)    Or  acetaminophen (TYLENOL) suppository 650 mg (has no administration in time range)  senna-docusate (Senokot-S) tablet 1 tablet (has no administration in time range)  HYDROmorphone (DILAUDID) injection 1 mg (has no administration in time range)  acetaminophen (TYLENOL) tablet 1,000 mg (1,000 mg Oral Given 09/28/22 2211)  ceFEPIme (MAXIPIME) 2 g in sodium chloride 0.9 % 100 mL IVPB (0 g Intravenous Stopped 09/29/22 0237)  fentaNYL (SUBLIMAZE) injection 50 mcg (50 mcg Intravenous Given 09/29/22 0154)    Mobility non-ambulatory     Focused Assessments Neuro Assessment Handoff:  Swallow screen pass? Yes          Neuro Assessment:   Neuro Checks:      Has TPA been given? No If patient is a Neuro Trauma and patient is going to OR before floor call report to [place] nurse: [phone] or [phone]   R Recommendations: See Admitting Provider Note  Report given to:   Additional Notes: Pt here to get his left leg amputated due another osteomyelitis infection similar to his right.

## 2022-09-29 NOTE — Consult Note (Signed)
ORTHOPAEDIC CONSULTATION  REQUESTING PHYSICIAN: Sid Falcon, MD  Chief Complaint: Fever chills and increasing pain left foot.  HPI: Gregory May is a 62 y.o. male who presents with abscess left calcaneus.  Patient is status post right transtibial amputation.  Patient recently has had a necrotic purulent draining ulcer on the left heel.  Patient wanted to start with oral antibiotics.  Despite oral antibiotics patient has been spiking a fever has had chills increasing pain and increasing redness more proximal in the left ankle.  Past Medical History:  Diagnosis Date   Arthritis    bilateral hips   Cutaneous abscess of left foot    Deep vein thrombosis (DVT) ([facility])    Diabetes mellitus    type II   Diabetic ulcer of heel ([facility])    Right heel   DJD (degenerative joint disease)    DVT (deep venous thrombosis) ([place]) 02/08/2014   Proximal provoked. Date of diagnosis February 08 2014 Duration of anticoagulation: 6 months. End date 08/12/2014.  Anticoagulant: Lovenox 120 units daily Switched to Eliquis on 05/25/2014      Ear drum perforation, right 03/06/2019   Non-pressure chronic ulcer of right calf, limited to breakdown of skin ([place]) 04/17/2017   Pulmonary emboli ([place]) 02/08/2014   Date of diagnosis February 08 2014, on chest CTA Hospitalized for 3 days Had some symptoms of shortness of breath, and chest pain With intercurrent DVT of the left LE. Duration of anticoagulation: 8 months. End date 10/11/2014.  Anticoagulant: Lovenox 120 units daily Switched to Eliquis on 05/25/2014 per patient preference    Pulmonary embolism ([place])    Sebaceous cyst    on back of neck   Past Surgical History:  Procedure Laterality Date   AMPUTATION Right 06/03/2015   Procedure: Right Below Knee Amputation;  Surgeon: [name], MD;  Location: [facility];  Service: Orthopedics;  Laterality: Right;   AMPUTATION Left 07/24/2019   Procedure: LEFT FOOT 3RD RAY AMPUTATION;  Surgeon: [name], MD;   Location: [place];  Service: Orthopedics;  Laterality: Left;   AMPUTATION Left 06/23/2021   Procedure: LEFT 2ND TOE AMPUTATION;  Surgeon: [name], MD;  Location: [place];  Service: Orthopedics;  Laterality: Left;   AMPUTATION Right 08/21/2021   Procedure: AMPUTATION RIGHT INDEX FINGER;  Surgeon: Orene Desanctis, MD;  Location: [place];  Service: Orthopedics;  Laterality: Right;   BUBBLE STUDY  01/24/2022   Procedure: BUBBLE STUDY;  Surgeon: Josue Hector, MD;  Location: [place];  Service: Cardiovascular;;   CLOSED REDUCTION WITH HUMER PIN INSERTION  1974   left hip   HARDWARE REMOVAL Left 07/21/2014   Procedure: HARDWARE REMOVAL;  Surgeon: Ninetta Lights, MD;  Location: [facility];  Service: Orthopedics;  Laterality: Left;   I & D EXTREMITY Right 08/21/2021   Procedure: IRRIGATION AND DEBRIDEMENT RIGHT INDEX FINGER;  Surgeon: Orene Desanctis, MD;  Location: [facility];  Service: Orthopedics;  Laterality: Right;   PATENT FORAMEN OVALE(PFO) CLOSURE N/A 02/28/2022   Procedure: PATENT FORAMEN OVALE(PFO) CLOSURE;  Surgeon: Sherren Mocha, MD;  Location: [facility];  Service: Cardiovascular;  Laterality: N/A;   ROTATOR CUFF REPAIR Right 2005 (approx)   TEE WITHOUT CARDIOVERSION N/A 01/24/2022   Procedure: TRANSESOPHAGEAL ECHOCARDIOGRAM (TEE);  Surgeon: Josue Hector, MD;  Location[facility];  Service: Cardiovascular;  Laterality: N/A;   TOTAL HIP ARTHROPLASTY Left 07/21/2014   Procedure: TOTAL HIP ARTHROPLASTY ANTERIOR APPROACH;  Surgeon: Ninetta Lights, MD;  Location: [facility];  Service: Orthopedics;  Laterality: Left;   TOTAL HIP ARTHROPLASTY Right 2006 (approx)   right hip replaced   Social History   Socioeconomic History   Marital status: Single    Spouse name: Not on file   Number of children: 0   Years of education: Not on file   Highest education level: Not on file  Occupational History   Occupation: disabled  Tobacco Use   Smoking status: Never   Smokeless tobacco: Never  Vaping  Use   Vaping Use: Never used  Substance and Sexual Activity   Alcohol use: Not Currently    Comment: beer and mixed drink maybe 5  times a month   Drug use: No   Sexual activity: Never  Other Topics Concern   Not on file  Social History Narrative   Not on file   Social Determinants of Health   Financial Resource Strain: Low Risk  (12/15/2021)   Overall Financial Resource Strain (CARDIA)    Difficulty of Paying Living Expenses: Not hard at all  Food Insecurity: No Food Insecurity (03/29/2022)   Hunger Vital Sign    Worried About Running Out of Food in the Last Year: Never true    [place] in the Last Year: Never true  Transportation Needs: No Transportation Needs (03/29/2022)   PRAPARE - Hydrologist (Medical): No    Lack of Transportation (Non-Medical): No  Physical Activity: Inactive (12/15/2021)   Exercise Vital Sign    Days of Exercise per Week: 0 days    Minutes of Exercise per Session: 0 min  Stress: No Stress Concern Present (12/15/2021)   [place]    Feeling of Stress : Not at all  Social Connections: Socially Isolated (12/15/2021)   Social Connection and Isolation Panel [NHANES]    Frequency of Communication with Friends and Family: More than three times a week    Frequency of Social Gatherings with Friends and Family: More than three times a week    Attends Religious Services: Never    Marine scientist or Organizations: No    Attends Music therapist: Never    Marital Status: Never married   Family History  Problem Relation Age of Onset   Breast cancer Mother    Cancer Mother        small intestine   Liver cancer Mother    Diabetes Mother    Diabetes Father    Diabetes Brother    Hypertension Maternal Grandmother    Heart Problems Maternal Grandmother    Diabetes Paternal Grandmother    Diabetes Paternal Grandfather    Diabetes Brother    -  negative except otherwise stated in the family history section No Known Allergies Prior to Admission medications   Medication Sig Start Date End Date Taking? Authorizing Provider  HYDROcodone-acetaminophen (NORCO/VICODIN) 5-325 MG tablet Take 1 tablet by mouth every 6 (six) hours as needed for moderate pain. 09/28/22   [name], MD  warfarin (COUMADIN) 5 MG tablet Take 2 (two) of your 5 mg peach-colored warfarin tablets on Mondays and Thursdays. All OTHER DAYS, (Sundays, Tuesdays, Wednesdays, Fridays and Saturdays) take two and one-half ( 2 & 1/2 tablets.) 07/23/22  Yes Pennie Banter, RPH-CPP  Accu-Chek FastClix Lancets MISC Use Accu Chek Fastclix lancets to check blood sugar three times daily. DX:E11.65 09/10/19   Elayne Snare, MD  ACCU-CHEK GUIDE test strip  TEST BLOOD SUGAR THREE TIMES DAILY AS DIRECTED 11/21/21   Elayne Snare, MD  acetaminophen (TYLENOL) 500 MG tablet Take 500-1,000 mg by mouth at bedtime as needed for mild pain (pain).    [provider]  amoxicillin-clavulanate (AUGMENTIN) 500-125 MG tablet Take 1 tablet by mouth 3 (three) times daily. 07/31/22   Suzan Slick, NP  APPLE CIDER VINEGAR PO Take 1 each by mouth daily. With Ginger    [provider]  doxycycline (VIBRA-TABS) 100 MG tablet Take 1 tablet (100 mg total) by mouth 2 (two) times daily. 09/27/22   [name], MD  empagliflozin (JARDIANCE) 25 MG TABS tablet Take 1 tablet (25 mg total) by mouth daily. 07/14/20   Alexandria Lodge, MD  fluticasone [facility]) 50 MCG/ACT nasal spray USE 2 SPRAY(S) IN EACH NOSTRIL AT BEDTIME AS NEEDED FOR  SINUS  DRAINAGE 08/16/22   Idamae Schuller, MD  gabapentin (NEURONTIN) 300 MG capsule TAKE 1 CAPSULE FOUR TIMES DAILY AS NEEDED Patient taking differently: Take 600 mg by mouth 2 (two) times daily as needed (pain). 02/09/22   Elayne Snare, MD  insulin aspart (NOVOLOG) 100 UNIT/ML injection Inject 16-20 units twice a day (Max daily dose 40 units) Patient taking differently: Inject  16-20 Units into the skin 3 (three) times daily with meals. Sliding scale 12/07/21   Elayne Snare, MD  insulin degludec (TRESIBA FLEXTOUCH) 100 UNIT/ML FlexTouch Pen Inject up to 12 units at bedtime 12/07/21   Elayne Snare, MD  Insulin Syringes, Disposable, U-100 0.5 ML MISC Use to inject insulin 05/16/21   Elayne Snare, MD  metFORMIN (GLUCOPHAGE-XR) 750 MG 24 hr tablet TAKE 2 TABLETS EVERY DAY 10/20/21   Elayne Snare, MD  Multiple Vitamin (MULTIVITAMIN WITH MINERALS) TABS tablet Take 1 tablet by mouth every morning. Centrum    [provider]  pioglitazone (ACTOS) 30 MG tablet TAKE 1 TABLET EVERY DAY 10/20/21   Elayne Snare, MD  rosuvastatin (CRESTOR) 20 MG tablet Take 1 tablet (20 mg total) by mouth daily. 02/16/22   Riesa Pope, MD   DG Foot Complete Left  Result Date: 09/28/2022 CLINICAL DATA:  Concern for osteomyelitis. EXAM: LEFT FOOT - COMPLETE 3+ VIEW COMPARISON:  Left foot radiograph dated 04/18/2021. FINDINGS: Amputation of the second phalanges and midportion of the third metatarsal. There is a minimally displaced fracture of the base of the distal phalanx of the fourth digit, new since the prior radiograph. Acute osteomyelitis is not excluded. MRI or a white blood cell nuclear scan may provide better evaluation. The bones are osteopenic. There is hallux valgus. The soft tissues are unremarkable. Vascular calcifications noted. IMPRESSION: Minimally displaced fracture of the base of the distal phalanx of the fourth digit. Acute osteomyelitis is not excluded. Electronically Signed   By: Anner Crete M.D.   On: 09/28/2022 22:40   - pertinent xrays, CT, MRI studies were reviewed and independently interpreted  Positive ROS: All other systems have been reviewed and were otherwise negative with the exception of those mentioned in the HPI and as above.  Physical Exam: General: Alert, no acute distress Psychiatric: Patient is competent for consent with normal mood and affect Lymphatic:  No axillary or cervical lymphadenopathy Cardiovascular: No pedal edema Respiratory: No cyanosis, no use of accessory musculature GI: No organomegaly, abdomen is soft and non-tender    Images:  @ENCIMAGES$ @  Labs:  Lab Results  Component Value Date   HGBA1C 6.9 (H) 09/24/2022   HGBA1C 7.2 (H) 03/08/2022   HGBA1C 7.2 (H) 11/29/2021  ESRSEDRATE 11 11/15/2021   ESRSEDRATE 25 (H) 08/19/2021   ESRSEDRATE 8 07/15/2019   CRP 0.7 11/17/2021   CRP <0.5 11/15/2021   CRP 1.7 (H) 08/19/2021   REPTSTATUS 08/26/2021 FINAL 08/21/2021   GRAMSTAIN  08/21/2021    WBC PRESENT,BOTH PMN AND MONONUCLEAR NO ORGANISMS SEEN    CULT  08/21/2021    No growth aerobically or anaerobically. Performed at [facility], [place] [address] [place], [place] [address]    LABORGA STAPHYLOCOCCUS HOMINIS 08/21/2021    Lab Results  Component Value Date   ALBUMIN 2.8 (L) 09/28/2022   ALBUMIN 4.1 09/24/2022   ALBUMIN 4.2 03/08/2022        Latest Ref Rng & Units 09/29/2022    5:52 AM 09/28/2022   10:26 PM 02/19/2022   12:40 PM  CBC EXTENDED  WBC 4.0 - 10.5 K/uL 16.6  19.2  8.3   RBC 4.22 - 5.81 MIL/uL 4.12  4.57  4.82   Hemoglobin 13.0 - 17.0 g/dL 12.8  14.0  15.8   HCT 39.0 - 52.0 % 38.1  43.3  45.5   Platelets 150 - 400 K/uL 314  354  291   NEUT# 1.7 - 7.7 K/uL  16.3    Lymph# 0.7 - 4.0 K/uL  1.0      Neurologic: Patient does not have protective sensation bilateral lower extremities.   MUSCULOSKELETAL:   Skin: Examination patient has cellulitis that extends proximal to the ankle.  There is purulent drainage from the ulcer on the left heel.  Patient has a palpable dorsalis pedis pulse.  White cell count 16.6 hemoglobin 12.8.  Albumin 2.8.  Hemoglobin A1c 6.9.  Radiographs show no ascending air in the soft tissue.  Patient has calcified vessels.  Assessment: Assessment: Osteomyelitis abscess left heel.  Plan: With failure of conservative treatment patient wishes proceed with a left  transtibial amputation.  Risk and benefits were discussed including risk of the wound not healing need for additional surgery.  Patient states she understands wished to proceed at this time.  Thank you for the consult and the opportunity to see Mr. Dorna Bloom, MD [facility] [phone] 7:40 AM

## 2022-09-29 NOTE — Progress Notes (Addendum)
ANTICOAGULATION CONSULT NOTE - Follow Up Consult  Pharmacy Consult for Warfarin Indication: DVT  No Known Allergies  Vital Signs: Temp: 97.7 F (36.5 C) (02/17 1259) Temp Source: Oral (02/17 1259) BP: 101/56 (02/17 1259) Pulse Rate: 56 (02/17 1259)  Labs: Recent Labs    09/28/22 2226 09/29/22 0552  HGB 14.0 12.8*  HCT 43.3 38.1*  PLT 354 314  LABPROT 29.7* 33.5*  INR 2.9* 3.3*  CREATININE 0.92 0.84    CrCl cannot be calculated (Unknown ideal weight.).  Assessment: 62 year old s/p BKA this admission to resume warfarin for DVT/retinal artery occlusion, INR today 3.3  INR on admission 2.9, last dose 2/16  Dose PTA - 10 mg MWF, 12.5 mg TThSS  Goal of Therapy:  INR 2-3 Monitor platelets by anticoagulation protocol: Yes   Plan:  Warfarin 3 mg po x 1 dose today Daily INR  Thank you Anette Guarneri, PharmD  09/29/2022,2:01 PM

## 2022-09-29 NOTE — Op Note (Signed)
09/29/2022  8:44 AM  PATIENT:  Gregory May    PRE-OPERATIVE DIAGNOSIS:  infection  POST-OPERATIVE DIAGNOSIS:  Same  PROCEDURE:  AMPUTATION BELOW KNEE Application of Kerecis micro graft 38 cm and Kerecis sheet 7 x 10 cm. Application of Prevena customizable and Prevena arthroform wound VAC dressings Application of Vive Wear stump shrinker and the Hanger limb protector  SURGEON:  [name], MD  ANESTHESIA:   General  PREOPERATIVE INDICATIONS:  Gregory May is a  62 y.o. male with a diagnosis of infection who failed conservative measures and elected for surgical management.    The risks benefits and alternatives were discussed with the patient preoperatively including but not limited to the risks of infection, bleeding, nerve injury, cardiopulmonary complications, the need for revision surgery, among others, and the patient was willing to proceed.  OPERATIVE IMPLANTS: Kerecis micro graft 38 cm and Kerecis sheet 7 x 10 cm.   OPERATIVE FINDINGS: healthy muscle, no infection at level of amputation  OPERATIVE PROCEDURE: Patient was brought to the operating room after undergoing a regional anesthetic.  After adequate levels anesthesia were obtained a thigh tourniquet was placed and the lower extremity was prepped using DuraPrep draped into a sterile field. The foot was draped out of the sterile field with impervious stockinette.  A timeout was called and the tourniquet inflated.  A transverse skin incision was made 12 cm distal to the tibial tubercle, the incision curved proximally, and a large posterior flap was created.  The tibia was transected just proximal to the skin incision and beveled anteriorly.  The fibula was transected just proximal to the tibial incision.  The sciatic nerve was pulled cut and allowed to retract.  The vascular bundles were suture ligated with 2-0 silk.  The tourniquet was deflated and hemostasis obtained.    Drill holes were placed through the  tibia and fibula to secure the [facility] tissue graft and the gastrocnemius fascia.    The Kerecis micro powder 38 cm was applied to the open wound that has a 200 cm surface area.  The 7 x 10 cm Kerecis sheet was then folded and secured to the distal tibia and fibula with #1 Vicryl.  A separate drill hole was then used to secure the [facility] tissue graft and the gastrocnemius fascia to the dorsum of the tibia.    The deep and superficial fascial layers were closed using #1 Vicryl.  The skin was closed using staples.    The Prevena customizable dressing was applied this was overwrapped with the arthroform sponge.  Charlie Pitter was used to secure the sponges and the circumferential compression was secured to the skin with Dermatac.  This was connected to the wound VAC pump and had a good suction fit this was covered with a stump shrinker and a limb protector.  Patient was taken to the PACU in stable condition.   DISCHARGE PLANNING:  Antibiotic duration: 24-hour antibiotics  Weightbearing: Nonweightbearing on the operative extremity  Pain medication: Opioid pathway  Dressing care/ Wound VAC: Continue wound VAC with the Prevena plus pump at discharge for 1 week  Ambulatory devices: Walker or kneeling scooter  Discharge to: Discharge planning based on recommendations per physical therapy  Follow-up: In the office 1 week after discharge.

## 2022-09-29 NOTE — H&P (Signed)
Date: 09/29/2022               Patient Name:  Gregory May MRN: [id number]  DOB: [birth year] Age / Sex: 62 y.o., male   PCP: Idamae Schuller, MD         Medical Service: Internal Medicine Teaching Service         Attending Physician: Dr. Sid Falcon, MD      First Contact: Dr. Johny Blamer, DO Pager [phone]    Second Contact: Dr. 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 Sharol Given for fever and worsening left lower extremity pain.  The patient last saw Dr. Sharol Given on 2/15 and was scheduled to get a L BKA with him on 2/21, but noticed that he had a fever of 101 today and his left leg pain had also been worsening. He also had a fever last week, but forgot to mention this to him. He called Dr. Jess Barters office today and his RN advised the patient to come to the ED for IV antibiotics (as he failed oral antibiotics) and also so Dr. Sharol Given could move up his L BKA to this morning (2/17). 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   He last took his coumadin on yesterday (2/16) in the morning.   The patient notes that his leg pain has acutely worsened while here in the ED, since he had been sitting in the wheelchair for a while. His pain has been so bad, that it has interfered with his sleep. The pain feels like he has stepped on a nail and is intermittent in nature. Additionally, he notes that the redness on his leg acutely worsened today - his aunt helped him change his gauze today and did not notice that it was as red as it is now. Otherwise, the patient denies any  n/v/d, constipation, or any urinary symptoms.   ED Course:  Febrile up to 100.80F, but vitals otherwise stable. CBC with leukocytosis to 19.2. CMP with low albumin at 2.8. INR up to 2.9. Left foot xray with fracture of distal phalanx of 4th digit, unable to exclude osteomyelitis.   Past Medical History [id number], complicated by diabetic foot ulcers and previous amputations DJD of knee Osteoarthritis of hips Hepatic steatosis Previous DVTs Central Retinal Artery Occlusion  Meds:  Doxycycline 100 mg bid since 2.15 Norco 5-325 mg q6h prn Jardiance 25 mg daily Gabapentin 600 mg bid Novolog 16-20u bid (usually just at dinner) [place] Aas 12u qhs - stopped x 3 months Metformin 750 mg bid Pioglitazone 30 mg daily Crestor 20 mg daily Warfarin 10 mg MWF, 12.5 mg T,Th,S,S - last took 2/16 at 8am  No outpatient medications have been marked as taking for the 09/28/22 encounter [facility] Encounter).   Past Surgical History R BKA 7 years ago Past Surgical History:  Procedure Laterality Date   AMPUTATION Right 06/03/2015   Procedure: Right Below Knee Amputation;  Surgeon: [name], MD;  Location: [place];  Service: Orthopedics;  Laterality: Right;  AMPUTATION Left 07/24/2019   Procedure: LEFT FOOT 3RD RAY AMPUTATION;  Surgeon: [name], MD;  Location: [place];  Service: Orthopedics;  Laterality: Left;   AMPUTATION Left 06/23/2021   Procedure: LEFT 2ND TOE AMPUTATION;  Surgeon: [name], MD;  Location: [place];  Service: Orthopedics;  Laterality: Left;   AMPUTATION Right 08/21/2021   Procedure: AMPUTATION RIGHT INDEX FINGER;  Surgeon: Orene Desanctis, MD;  Location: [place];  Service: Orthopedics;  Laterality: Right;   BUBBLE STUDY  01/24/2022   Procedure: BUBBLE STUDY;  Surgeon: Josue Hector, MD;  Location: [place];  Service: Cardiovascular;;   CLOSED REDUCTION WITH HUMER PIN INSERTION  1974   left hip   HARDWARE REMOVAL Left 07/21/2014   Procedure: HARDWARE REMOVAL;  Surgeon: Ninetta Lights, MD;  Location: [place];  Service: Orthopedics;  Laterality: Left;   I & D EXTREMITY Right 08/21/2021   Procedure: IRRIGATION AND DEBRIDEMENT RIGHT INDEX FINGER;  Surgeon: Orene Desanctis, MD;  Location: [place];  Service: Orthopedics;  Laterality: Right;   PATENT FORAMEN OVALE(PFO) CLOSURE N/A 02/28/2022   Procedure: PATENT FORAMEN OVALE(PFO) CLOSURE;  Surgeon: Sherren Mocha, MD;  Location: [place] CV [facility];  Service: Cardiovascular;  Laterality: N/A;   ROTATOR CUFF REPAIR Right 2005 (approx)   TEE WITHOUT CARDIOVERSION N/A 01/24/2022   Procedure: TRANSESOPHAGEAL ECHOCARDIOGRAM (TEE);  Surgeon: Josue Hector, MD;  Location: [facility] END[facility];  Service: Cardiovascular;  Laterality: N/A;   TOTAL HIP ARTHROPLASTY Left 07/21/2014   Procedure: TOTAL HIP ARTHROPLASTY ANTERIOR APPROACH;  Surgeon: Ninetta Lights, MD;  Location: [place];  Service: Orthopedics;  Laterality: Left;   TOTAL HIP ARTHROPLASTY Right 2006 (approx)   right hip replaced   Social:  Lives with his brother in [place] Occupation: used to do welding/concrete  Level of Function: independent of ADLs, PCP: Dr. Humphrey Rolls, [facility] Substances: no tobacco, occasional alcohol use, and no other substance use  Family History: mother had metastatic breast cancer; father had diabetes   Allergies: Allergies as of 09/28/2022   (No Known Allergies)   Review of Systems: A complete ROS was negative except as per HPI.   OBJECTIVE:   Physical Exam: Blood pressure (!) 157/90, pulse 80, temperature 98.6 F (37 C), resp. rate 16, SpO2 95 %.   Constitutional: chronically ill appearing male laying in bed, in no acute distress Cardiovascular: regular rate and rhythm, no m/r/g Pulmonary/Chest: normal work of breathing on room air, lungs clear to auscultation bilaterally Abdominal: soft, non-tender, non-distended MSK: s/p R BKA, s/p 2nd and 3rd digit amputation on left; s/p DIP amputation on right hand Neurological: alert & oriented x 3, no focal  deficit Skin: Diffuse erythema of left lower extremity starting at foot and extending up leg with ulcer noted at base of the left heel with drainage Psych: normal mood and affect   Labs: CBC    Component Value Date/Time   WBC 19.2 (H) 09/28/2022 2226   RBC 4.57 09/28/2022 2226   HGB 14.0 09/28/2022 2226   HGB 15.8 02/19/2022 1240   HCT 43.3 09/28/2022 2226   HCT 45.5 02/19/2022 1240   PLT 354 09/28/2022 2226   PLT 291 02/19/2022 1240   MCV 94.7 09/28/2022 2226   MCV 94 02/19/2022 1240   MCH 30.6 09/28/2022 2226   MCHC 32.3 09/28/2022 2226   RDW 14.3 09/28/2022 2226   RDW 12.9 02/19/2022 1240   LYMPHSABS 1.0 09/28/2022 2226   LYMPHSABS 2.2 07/15/2019 1617   MONOABS 1.6 (H) 09/28/2022  2226   EOSABS 0.0 09/28/2022 2226   EOSABS 0.1 07/15/2019 1617   BASOSABS 0.0 09/28/2022 2226   BASOSABS 0.1 07/15/2019 1617     CMP     Component Value Date/Time   NA 135 09/28/2022 2226   NA 140 02/19/2022 1240   K 3.8 09/28/2022 2226   CL 99 09/28/2022 2226   CO2 24 09/28/2022 2226   GLUCOSE 179 (H) 09/28/2022 2226   BUN 24 (H) 09/28/2022 2226   BUN 17 02/19/2022 1240   CREATININE 0.92 09/28/2022 2226   CALCIUM 8.6 (L) 09/28/2022 2226   PROT 7.0 09/28/2022 2226   PROT 7.2 04/20/2020 1654   ALBUMIN 2.8 (L) 09/28/2022 2226   ALBUMIN 4.5 04/20/2020 1654   AST 40 09/28/2022 2226   ALT 36 09/28/2022 2226   ALKPHOS 78 09/28/2022 2226   BILITOT 0.3 09/28/2022 2226   BILITOT 0.3 04/20/2020 1654   GFRNONAA >60 09/28/2022 2226   GFRAA 114 04/20/2020 1654    Imaging:  Left foot xray: Minimally displaced fracture of the base of the distal phalanx of the fourth digit. Acute osteomyelitis is not excluded.    EKG: not obtained  ASSESSMENT & PLAN:   Assessment & Plan by Problem: Principal Problem:   Lower extremity cellulitis   Gregory May is a 62 y.o. person living with a history of type 2 diabetes, hepatic steatosis, previous DVTs, and central retinal occlusion on  coumadin who presented with a fever and left lower extremity pain and admitted for left lower extremity cellulitis on hospital day 0  Left lower extremity ascending cellulitis Acute osteomyelitis not excluded The patient came in at the advice of Dr. Sharol Given - he presented with a fever of 100.8, leukocytosis to 19.2, worsening LLE pain, and erythema of his leg. The patient had been on doxycycline 100 mg bid since 2/15, however, was noted to have failed oral antibiotics and was recommended to come to the hospital for IV antibiotics. The patient was also scheduled to get a L BKA on 2/21, however, this is being moved up due to his hospitalization now, although the patient's INR is elevated to 2.9. The patient received a dose of vanc + cefepime in the ED.  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 The patient is on Novolog 16-20u bid prn (but usually only takes this once a day) and has not taken his [place] 12u qhs in at least 3 months. He last saw his endocrinologist on 2/15, and was advised to continue his SSI (can go up to 24u with higher carbohydrate meals) and to restart [place], but at 6u qhs. The patient is also on pioglitazone 30 mg, jardiance 25 mg, and metformin 750 mg bid. It was also recommended that the patient get the freestyle libre for glucose monitoring, especially while recovering from surgery. 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 He  takes  10 mg of coumadin every MWF, and 12.5 mg on all other days- last dose was the morning of 2/16. INR most recently was 2.9 in the ED. - Repeat INR in the morning prior to surgery  Hyperlipidemia On crestor 20 mg daily. Holding while NPO.   Diet: NPO VTE:  Held for surgery IVF: None,None Code: Full  Prior to Admission Living Arrangement: Home, living brother Anticipated Discharge Location:  Pending Barriers to Discharge: medical stability  Dispo: Admit patient to Inpatient with expected length of stay greater than 2 midnights.  Signed: Angelique Blonder, DO Internal Medicine Resident PGY-1 09/29/2022, 4:02 AM   Please contact on call pager, weekdays after 5pm and weekends after 1pm, at [phone].

## 2022-09-29 NOTE — ED Provider Notes (Signed)
[facility] Provider Note   CSN: [id number] Arrival date & time: 09/28/22  2105     History  Chief Complaint  Patient presents with   Leg Pain / Fever    Gregory May is a 62 y.o. male.  HPI   Patient with extensive history including diabetes and previous right BKA presents with left leg pain, redness and swelling Patient reports he has been on antibiotics per his orthopedist, but since he is now having fevers and worsening signs of infection, he will require left below the knee amputation.  Home Medications Prior to Admission medications   Medication Sig Start Date End Date Taking? Authorizing Provider  HYDROcodone-acetaminophen (NORCO/VICODIN) 5-325 MG tablet Take 1 tablet by mouth every 6 (six) hours as needed for moderate pain. 09/28/22   [name], MD  Accu-Chek FastClix Lancets MISC Use Accu Chek Fastclix lancets to check blood sugar three times daily. DX:E11.65 09/10/19   Elayne Snare, MD  ACCU-CHEK GUIDE test strip TEST BLOOD SUGAR THREE TIMES DAILY AS DIRECTED 11/21/21   Elayne Snare, MD  acetaminophen (TYLENOL) 500 MG tablet Take 500-1,000 mg by mouth at bedtime as needed for mild pain (pain).    [provider]  amoxicillin-clavulanate (AUGMENTIN) 500-125 MG tablet Take 1 tablet by mouth 3 (three) times daily. 07/31/22   Suzan Slick, NP  APPLE CIDER VINEGAR PO Take 1 each by mouth daily. With Ginger    [provider]  doxycycline (VIBRA-TABS) 100 MG tablet Take 1 tablet (100 mg total) by mouth 2 (two) times daily. 09/27/22   [name], MD  empagliflozin (JARDIANCE) 25 MG TABS tablet Take 1 tablet (25 mg total) by mouth daily. 07/14/20   Alexand[place] Lodge, MD  fluticasone [facility]) 50 MCG/ACT nasal spray USE 2 SPRAY(S) IN EACH NOSTRIL AT BEDTIME AS NEEDED FOR  SINUS  DRAINAGE 08/16/22   Idamae Schuller, MD  gabapentin (NEURONTIN) 300 MG capsule TAKE 1 CAPSULE FOUR TIMES DAILY AS NEEDED Patient taking  differently: Take 600 mg by mouth 2 (two) times daily as needed (pain). 02/09/22   Elayne Snare, MD  insulin aspart (NOVOLOG) 100 UNIT/ML injection Inject 16-20 units twice a day (Max daily dose 40 units) Patient taking differently: Inject 16-20 Units into the skin 3 (three) times daily with meals. Sliding scale 12/07/21   Elayne Snare, MD  insulin degludec (TRESIBA FLEXTOUCH) 100 UNIT/ML FlexTouch Pen Inject up to 12 units at bedtime 12/07/21   Elayne Snare, MD  Insulin Syringes, Disposable, U-100 0.5 ML MISC Use to inject insulin 05/16/21   Elayne Snare, MD  metFORMIN (GLUCOPHAGE-XR) 750 MG 24 hr tablet TAKE 2 TABLETS EVERY DAY 10/20/21   Elayne Snare, MD  Multiple Vitamin (MULTIVITAMIN WITH MINERALS) TABS tablet Take 1 tablet by mouth every morning. Centrum    [provider]  pioglitazone (ACTOS) 30 MG tablet TAKE 1 TABLET EVERY DAY 10/20/21   Elayne Snare, MD  rosuvastatin (CRESTOR) 20 MG tablet Take 1 tablet (20 mg total) by mouth daily. 02/16/22   Riesa Pope, MD  warfarin (COUMADIN) 5 MG tablet Take 2 (two) of your 5 mg peach-colored warfarin tablets on Mondays and Thursdays. All OTHER DAYS, (Sundays, Tuesdays, Wednesdays, Fridays and Saturdays) take two and one-half ( 2 & 1/2 tablets.) 07/23/22   Pennie Banter, RPH-CPP      Allergies    Patient has no known allergies.    Review of Systems   Review of Systems  Constitutional:  Positive for fever.  Musculoskeletal:  Positive for arthralgias.    Physical Exam Updated Vital Signs BP (!) 157/90   Pulse 80   Temp 98.6 F (37 C)   Resp 16   SpO2 95%  Physical Exam CONSTITUTIONAL: Disheveled, appears older than stated age, talking on the phone with his aunt HEAD: Normocephalic/atraumatic EYES: EOMI ENMT: Mucous membranes moist, poor dentition NECK: supple no meningeal signs NEURO: Pt is awake/alert/appropriate EXTREMITIES: Distal pulses intact in left lower extremity.  Diffuse erythema and tenderness noted to the left foot.   See photo below. No obvious crepitus Patient with previous right BKA SKIN: warm, color normal, see photo    ED Results / Procedures / Treatments   Labs (all labs ordered are listed, but only abnormal results are displayed) Labs Reviewed  CBC WITH DIFFERENTIAL/PLATELET - Abnormal; Notable for the following components:      Result Value   WBC 19.2 (*)    Neutro Abs 16.3 (*)    Monocytes Absolute 1.6 (*)    Abs Immature Granulocytes 0.15 (*)    All other components within normal limits  COMPREHENSIVE METABOLIC PANEL - Abnormal; Notable for the following components:   Glucose, Bld 179 (*)    BUN 24 (*)    Calcium 8.6 (*)    Albumin 2.8 (*)    All other components within normal limits  PROTIME-INR - Abnormal; Notable for the following components:   Prothrombin Time 29.7 (*)    INR 2.9 (*)    All other components within normal limits  CULTURE, BLOOD (ROUTINE X 2)  CULTURE, BLOOD (ROUTINE X 2)  LACTIC ACID, PLASMA  LACTIC ACID, PLASMA  TYPE AND SCREEN    EKG None  Radiology DG Foot Complete Left  Result Date: 09/28/2022 CLINICAL DATA:  Concern for osteomyelitis. EXAM: LEFT FOOT - COMPLETE 3+ VIEW COMPARISON:  Left foot radiograph dated 04/18/2021. FINDINGS: Amputation of the second phalanges and midportion of the third metatarsal. There is a minimally displaced fracture of the base of the distal phalanx of the fourth digit, new since the prior radiograph. Acute osteomyelitis is not excluded. MRI or a white blood cell nuclear scan may provide better evaluation. The bones are osteopenic. There is hallux valgus. The soft tissues are unremarkable. Vascular calcifications noted. IMPRESSION: Minimally displaced fracture of the base of the distal phalanx of the fourth digit. Acute osteomyelitis is not excluded. Electronically Signed   By: Anner Crete M.D.   On: 09/28/2022 22:40    Procedures Procedures    Medications Ordered in ED Medications  vancomycin (VANCOCIN) IVPB 1000  mg/200 mL premix (has no administration in time range)  ceFEPIme (MAXIPIME) 2 g in sodium chloride 0.9 % 100 mL IVPB (2 g Intravenous New Bag/Given 09/29/22 0156)  acetaminophen (TYLENOL) tablet 1,000 mg (1,000 mg Oral Given 09/28/22 2211)  fentaNYL (SUBLIMAZE) injection 50 mcg (50 mcg Intravenous Given 09/29/22 0154)    ED Course/ Medical Decision Making/ A&P                             Medical Decision Making Amount and/or Complexity of Data Reviewed Labs: ordered.  Risk Prescription drug management. Decision regarding hospitalization.   This patient presents to the ED for concern of leg pain, this involves an extensive number of treatment options, and is a complaint that carries with it a high risk of complications and morbidity.  The differential diagnosis includes but is not limited to muscle  strain, fracture, cellulitis, osteomyelitis, necrotizing fasciitis  Comorbidities that complicate the patient evaluation: Patient's presentation is complicated by their history of diabetes  Social Determinants of Health: Patient's  previous amputation   increases the complexity of managing their presentation  Additional history obtained: Records reviewed  outpatient records reviewed  Lab Tests: I Ordered, and personally interpreted labs.  The pertinent results include: Leukocytosis  Imaging Studies ordered: I ordered imaging studies including X-ray foot   I independently visualized and interpreted imaging which showed possible osteomyelitis I agree with the radiologist interpretation  Medicines ordered and prescription drug management: I ordered medication including IV antibiotics for cellulitis/osteomyelitis   Critical Interventions:   antibiotics, admission  Consultations Obtained: I requested consultation with the admitting physician internal medicine resident , and discussed  findings as well as pertinent plan - they recommend: We will admit   Complexity of problems  addressed: Patient's presentation is most consistent with  acute presentation with potential threat to life or bodily function  Disposition: After consideration of the diagnostic results and the patient's response to treatment,  I feel that the patent would benefit from admission   .    Patient sent in per his orthopedist to receive IV antibiotics in anticipation of his left BKA.  He will be admitted to his primary care service.  He has been made n.p.o. and antibiotics have been ordered.  Patient is currently not septic, he is overall stable. He is on Coumadin with an INR just under 3.  This will need to be managed prior to his surgery Endorsed the case to the on-call internal medicine resident       Final Clinical Impression(s) / ED Diagnoses Final diagnoses:  Cellulitis of left lower extremity    Rx / DC Orders ED Discharge Orders     None         Ripley Fraise, MD 09/29/22 [phone]

## 2022-09-30 DIAGNOSIS — L03116 Cellulitis of left lower limb: Secondary | ICD-10-CM | POA: Diagnosis not present

## 2022-09-30 DIAGNOSIS — Z89512 Acquired absence of left leg below knee: Secondary | ICD-10-CM | POA: Diagnosis not present

## 2022-09-30 LAB — BASIC METABOLIC PANEL
Anion gap: 10 (ref 5–15)
BUN: 20 mg/dL (ref 6–20)
CO2: 26 mmol/L (ref 22–32)
Calcium: 8.6 mg/dL — ABNORMAL LOW (ref 8.9–10.3)
Chloride: 104 mmol/L (ref 98–111)
Creatinine, Ser: 0.69 mg/dL (ref 0.61–1.24)
GFR, Estimated: >60 mL/min (ref >60)
Glucose, Bld: 119 mg/dL — ABNORMAL HIGH (ref 70–99)
Potassium: 3.9 mmol/L (ref 3.5–5.1)
Sodium: 140 mmol/L (ref 135–145)

## 2022-09-30 LAB — CBC
HCT: 36.3 % — ABNORMAL LOW (ref 39.0–52.0)
Hemoglobin: 11.8 g/dL — ABNORMAL LOW (ref 13.0–17.0)
MCH: 30.7 pg (ref 26.0–34.0)
MCHC: 32.5 g/dL (ref 30.0–36.0)
MCV: 94.5 fL (ref 80.0–100.0)
Platelets: 325 10*3/uL (ref 150–400)
RBC: 3.84 MIL/uL — ABNORMAL LOW (ref 4.22–5.81)
RDW: 14.2 % (ref 11.5–15.5)
WBC: 14 10*3/uL — ABNORMAL HIGH (ref 4.0–10.5)
nRBC: 0.1 % (ref 0.0–0.2)

## 2022-09-30 LAB — GLUCOSE, CAPILLARY
Glucose-Capillary: 150 mg/dL — ABNORMAL HIGH (ref 70–99)
Glucose-Capillary: 111 mg/dL — ABNORMAL HIGH (ref 70–99)
Glucose-Capillary: 94 mg/dL (ref 70–99)
Glucose-Capillary: 105 mg/dL — ABNORMAL HIGH (ref 70–99)
Glucose-Capillary: 189 mg/dL — ABNORMAL HIGH (ref 70–99)
Glucose-Capillary: 151 mg/dL — ABNORMAL HIGH (ref 70–99)
Glucose-Capillary: 180 mg/dL — ABNORMAL HIGH (ref 70–99)

## 2022-09-30 LAB — PROTIME-INR
INR: 3.4 — ABNORMAL HIGH (ref 0.8–1.2)
Prothrombin Time: 33.8 seconds — ABNORMAL HIGH (ref 11.4–15.2)

## 2022-09-30 MED ORDER — WARFARIN SODIUM 1 MG PO TABS
1.0000 mg | ORAL_TABLET | Freq: Once | ORAL | Status: AC
  Administered 2022-09-30: 1 mg via ORAL
  Filled 2022-09-30: qty 1

## 2022-09-30 MED ORDER — HYDROMORPHONE HCL 1 MG/ML IJ SOLN
0.5000 mg | INTRAMUSCULAR | Status: DC | PRN
  Administered 2022-09-30 – 2022-10-03 (×8): 1 mg via INTRAVENOUS
  Filled 2022-09-30 (×9): qty 1

## 2022-09-30 NOTE — Evaluation (Signed)
Physical Therapy Evaluation Patient Details Name: Gregory May MRN: [id number] DOB: 1960-10-27 Today's Date: 09/30/2022  History of Present Illness  Mr. Carline came in with severe LLE cellulitis with planned BKA by Dr. Sharol Given.  has a past medical history of Arthritis, Deep vein thrombosis (DVT) ([place]), Diabetes mellitus, Diabetic ulcer of heel ([place]), DJD (degenerative joint disease), Ear drum perforation, right (03/06/2019), Non-pressure chronic ulcer of right calf, limited to breakdown of skin, Pulmonary emboli [facility])  Clinical Impression   Pt admitted with above diagnosis. Lives at home with younger brother (who can provide intermittent assist), in a single-level home with 4 steps to enter; Prior to admission, pt was able to manage independently with R prosthesis and crutches; Presents to PT with significant pain limiting ability to [employer] today;  Had received dilaudid prior to therapy evals; Rolled R and semi-roll L in the bed with minguard assist, and pulled to long sit with bedrails; I anticipate good progress with lateral scoots, anterior posterior transfers, and therapeutic exercises for hip and knee extensor strengthening and flexor stretching (to prep fo rprosthesis), as his pain gets under control; Pt currently with functional limitations due to the deficits listed below (see PT Problem List). Pt will benefit from skilled PT to increase their independence and safety with mobility to allow discharge to the venue listed below.       Noted poor R prosthesis fit with very worn liner and socks; Plan to call prosthetist for a consult/visit to check on fit     Recommendations for follow up therapy are one component of a multi-disciplinary discharge planning process, led by the attending physician.  Recommendations may be updated based on patient status, additional functional criteria and insurance authorization.  Follow Up Recommendations Skilled nursing-short term rehab (<3  hours/day) Can patient physically be transported by private vehicle: No    Assistance Recommended at Discharge Intermittent Supervision/Assistance  Patient can return home with the following  Help with stairs or ramp for entrance    Equipment Recommendations Rolling walker (2 wheels);Wheelchair (measurements PT);Wheelchair cushion (measurements PT) (drop-arm BSC)  Recommendations for Other Services       Functional Status Assessment Patient has had a recent decline in their functional status and demonstrates the ability to make significant improvements in function in a reasonable and predictable amount of time.     Precautions / Restrictions Precautions Precautions: Other (comment) Precaution Comments: R residual limb with small wound; questionable prosthesis fit; Don't recommend to use R prosthesis for standing, as apivot point with lateral scooting should be fine Required Braces or Orthoses: Other Brace Other Brace: R prosthesis in room; porsthesis liner and socks are significantly worn/threadbare; Needs prosthetist consult Restrictions Weight Bearing Restrictions: Yes LLE Weight Bearing: Non weight bearing Other Position/Activity Restrictions: RLE with no ordered weight bearing restrictions, but with small wound residual limb, do not recommend weight bearing at this time      Mobility  Bed Mobility Overal bed mobility: Needs Assistance Bed Mobility: Rolling, Supine to Sit Rolling: Min guard (used rails)   Supine to sit: Supervision     General bed mobility comments: sued bil UEs and bedrails to pull from semi-supine to unsupported sitting in long sit; Pt declined further mobility due to pain    Transfers                   General transfer comment: Pt politely declined    Ambulation/Gait  Stairs            Wheelchair Mobility    Modified Rankin (Stroke Patients Only)       Balance                                              Pertinent Vitals/Pain Pain Assessment Pain Assessment: Faces Faces Pain Scale: Hurts whole lot Pain Location: L residual limb Pain Descriptors / Indicators: Grimacing, Guarding (also reorting pahntom pain/sensation) Pain Intervention(s): Limited activity within patient's tolerance, Monitored during session, Premedicated before session, Repositioned    Home Living Family/patient expects to be discharged to:: Private residence Living Arrangements: Other relatives (lives with younger brother) Available Help at Discharge: Family;Available PRN/intermittently Type of Home: House Home Access: Stairs to enter Entrance Stairs-Rails: Right Entrance Stairs-Number of Steps: 4   Home Layout: One level Home Equipment: International aid/development worker (2 wheels);Crutches      Prior Function Prior Level of Function : Independent/Modified Independent             Mobility Comments: crutches and prosthesis ADLs Comments: sits on seat and then brings legs into shower     Hand Dominance   Dominant Hand: Right    Extremity/Trunk Assessment   Upper Extremity Assessment Upper Extremity Assessment: Defer to OT evaluation (Reports R shoulder injury making it painful at times with transfers)    Lower Extremity Assessment Lower Extremity Assessment: RLE deficits/detail;LLE deficits/detail RLE Deficits / Details: Thin lower residual limb, with areas of pressure/redness distal lateral and a small wound at distal end of residual limb LLE Deficits / Details: grossly decr AROM and strength L hip and knee, limited by pain postop; phantom pain and sensations present; briefly educated on desensitization    Cervical / Trunk Assessment Cervical / Trunk Assessment: Normal  Communication   Communication: No difficulties  Cognition Arousal/Alertness: Awake/alert Behavior During Therapy: WFL for tasks assessed/performed, Impulsive Overall Cognitive Status: Impaired/Different  from baseline Area of Impairment: Attention, Memory, Safety/judgement, Awareness, Problem solving                     Memory: Decreased short-term memory   Safety/Judgement: Decreased awareness of safety, Decreased awareness of deficits Awareness: Intellectual Problem Solving: Decreased initiation General Comments: After a lengthy discussion re: why he should avoid wearing R prosthesis at this time, and LLE needs to heal before he can use a prosthesis, pt states he can't have a wheelchair in his home, and he plans to use cruthches and R prosthesis (?)        General Comments General comments (skin integrity, edema, etc.): Pt recounts his multiple hospital visits/CVAs, difficulties with thromboembolisms; he describes why he has been unable to get to prosthetist for fitcheck; As pain meds began to take effect, he was more able to move in the bed    Exercises Other Exercises Other Exercises: L hip extension into bolster on bed x5   Assessment/Plan    PT Assessment Patient needs continued PT services  PT Problem List Decreased strength;Decreased range of motion;Decreased activity tolerance;Decreased balance;Decreased mobility;Decreased coordination;Decreased cognition;Decreased knowledge of use of DME;Decreased safety awareness;Decreased knowledge of precautions;Impaired sensation;Decreased skin integrity;Pain       PT Treatment Interventions DME instruction;Functional mobility training;Therapeutic activities;Therapeutic exercise;Balance training;Neuromuscular re-education;Cognitive remediation;Patient/family education;Wheelchair mobility training    PT Goals (Current goals can be found in the Care Plan section)  Acute  Rehab PT Goals Patient Stated Goal: less pain LLE PT Goal Formulation: With patient Time For Goal Achievement: 10/14/22 Potential to Achieve Goals: Good Additional Goals Additional Goal #1: Pt will propel wheelchair including backwds and turns for 630f  independently    Frequency Min 2X/week     Co-evaluation PT/OT/SLP Co-Evaluation/Treatment: Yes Reason for Co-Treatment: To address functional/ADL transfers;Other (comment) (pt-centered decision to see simultaneously due to projected activity tolerance) PT goals addressed during session: Mobility/safety with mobility         AM-PAC PT "6 Clicks" Mobility  Outcome Measure Help needed turning from your back to your side while in a flat bed without using bedrails?: A Little Help needed moving from lying on your back to sitting on the side of a flat bed without using bedrails?: A Little Help needed moving to and from a bed to a chair (including a wheelchair)?: A Lot Help needed standing up from a chair using your arms (e.g., wheelchair or bedside chair)?: Total Help needed to walk in hospital room?: Total Help needed climbing 3-5 steps with a railing? : Total 6 Click Score: 11    End of Session   Activity Tolerance: Patient limited by pain Patient left: in bed;with call bell/phone within reach;with family/visitor present   PT Visit Diagnosis: Other abnormalities of gait and mobility (R26.89);Muscle weakness (generalized) (M62.81);Pain Pain - Right/Left: Left Pain - part of body: Leg    Time: 1430-1501 PT Time Calculation (min) (ACUTE ONLY): 31 min   Charges:   PT Evaluation $PT Eval Moderate Complexity: 1[place] [facility]  Acute Rehabilitation Services Office [phone]  H[place]letta [place]2/18/2024, 5:30 PM

## 2022-09-30 NOTE — Hospital Course (Addendum)
Gregory May is a 62 y.o. 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 Sharol Given for rapidly progressing left lower extremity cellulitis that failed oral antibiotics from an infected neuropathic ulcer on his heel. He was planning for left BKA on 10/03/2022 but due to his progression he was admitted for IV antibiotics and urgent operation. He underwent left BKA on 09/29/2022 and had an uncomplicated post op course with pain being the largest concern. PT/OT evaluated and recommended SNF or CIR. Unfortunately his insurance did not approve CIR and he was offered a bed at [facility] for rehab. He was discharged with oxycodone 15 mg every 4 hours as needed for pain along with scheduled tylenol, gabapentin, and a bowel regimen. He will follow up with orthopedics 1 week post op for wound vac change.    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 He had been recommended to use Tresiba 6 units daily as well as to get a freestyle [place] outpatient. He was continued on home pioglitazone 30 mg daily, Jardiance 25 mg daily, and metformin 750 mg twice daily. Blood sugars we well controlled during admission. We will continue to work with him on his regimen when he returns to our clinic outpatient.   History of central retinal artery occlusion Previous DVTs Chronic warfarin therapy INR was elevated above goal of 2-3 on admission. Underwent surgery as above the following morning. Warfarin was dosed by pharmacy based on INR. INR 1.5 on day of discharge.  Recommend INR checks at least every 2 days and he will resume his home dosing.   Hyperlipidemia Continued Crestor 20 daily.

## 2022-09-30 NOTE — Evaluation (Signed)
Occupational Therapy Evaluation Patient Details Name: Gregory May MRN: [id number] DOB: 1961/02/15 Today's Date: 09/30/2022   History of Present Illness Mr. Pottle came in with severe LLE cellulitis with planned BKA by Dr. Sharol Given.  has a past medical history of Arthritis, Deep vein thrombosis (DVT) ([place]), Diabetes mellitus, Diabetic ulcer of heel ([place]), DJD (degenerative joint disease), Ear drum perforation, right (03/06/2019), Non-pressure chronic ulcer of right calf, limited to breakdown of skin, Pulmonary emboli ([facility])   Clinical Impression   PTA, pt lived with his brother and was mod I. Pt performing UB ADL with set-up and LB ADL with mod A. Pt refusing OOB due to pain, but agreeable to bed-level eval. Pt with generalized BUE weakness, pain, decreased activity tolerance, sensation, vision, safety, and cognition. Pt with small developing wound on R residual limb and reports ill-fitting prosthesis he has been unable to get fixed due to lack of time and resources. Pt reports wheelchair unlikely to fit into home due to clutter, small bathroom, narrow halls, etc. Recommending discharge at SNF for continued OT services to optimize safety and independence in ADL and IADL.      Recommendations for follow up therapy are one component of a multi-disciplinary discharge planning process, led by the attending physician.  Recommendations may be updated based on patient status, additional functional criteria and insurance authorization.   Follow Up Recommendations  Skilled nursing-short term rehab (<3 hours/day)     Assistance Recommended at Discharge Intermittent Supervision/Assistance  Patient can return home with the following A lot of help with walking and/or transfers;A lot of help with bathing/dressing/bathroom;Assistance with cooking/housework;Help with stairs or ramp for entrance;Assist for transportation    Functional Status Assessment  Patient has had a recent decline in their  functional status and demonstrates the ability to make significant improvements in function in a reasonable and predictable amount of time.  Equipment Recommendations  Other (comment) (defer)    Recommendations for Other Services       Precautions / Restrictions Precautions Precautions: Other (comment) Precaution Comments: R residual limb with small wound; questionable prosthesis fit; Don't recommend to use R prosthesis for standing, as apivot point with lateral scooting should be fine Required Braces or Orthoses: Other Brace Other Brace: R prosthesis in room; porsthesis liner and socks are significantly worn/threadbare; Needs prosthetist consult Restrictions Weight Bearing Restrictions: Yes LLE Weight Bearing: Non weight bearing Other Position/Activity Restrictions: RLE with no ordered weight bearing restrictions, but with small wound residual limb, do not recommend weight bearing at this time      Mobility Bed Mobility Overal bed mobility: Needs Assistance Bed Mobility: Rolling, Supine to Sit Rolling: Min guard (used rails)   Supine to sit: Supervision     General bed mobility comments: used bil UEs and bedrails to pull from semi-supine to unsupported sitting in long sit; Pt declined further mobility due to pain    Transfers                   General transfer comment: Pt politely declined      Balance                                           ADL either performed or assessed with clinical judgement   ADL Overall ADL's : Needs assistance/impaired Eating/Feeding: Modified independent;Bed level   Grooming: Set up;Bed level   Upper Body Bathing: Set  up;Sitting   Lower Body Bathing: Moderate assistance;Sitting/lateral leans   Upper Body Dressing : Set up;Bed level   Lower Body Dressing: Sitting/lateral leans;Moderate assistance Lower Body Dressing Details (indicate cue type and reason): Able to shift weight in bed for pressure relief    Toilet Transfer Details (indicate cue type and reason): pt politely declined         Functional mobility during ADLs:  (Pt declined)       Vision Ability to See in Adequate Light: 2 Moderately impaired Patient Visual Report: No change from baseline Vision Assessment?: Vision impaired- to be further tested in functional context Additional Comments: previous retinal stroke and unable to see with R eye at all. pt performed visual tracking with mild difficulty with vertical tracking on the R temporal field     Perception     Praxis      Pertinent Vitals/Pain Pain Assessment Faces Pain Scale: Hurts whole lot Pain Location: L residual limb Pain Descriptors / Indicators: Grimacing, Guarding (also reorting pahntom pain/sensation)     Hand Dominance Right   Extremity/Trunk Assessment Upper Extremity Assessment Upper Extremity Assessment: Generalized weakness;RUE deficits/detail RUE Deficits / Details: R shoulder injury per pt which he reports makes it difficult for transfers. Also amputation to 2nd digit previously.   Lower Extremity Assessment Lower Extremity Assessment: Defer to PT evaluation RLE Deficits / Details: Thin lower residual limb, with areas of pressure/redness distal lateral and a small wound at distal end of residual limb LLE Deficits / Details: grossly decr AROM and strength L hip and knee, limited by pain postop; phantom pain and sensations present; briefly educated on desensitization   Cervical / Trunk Assessment Cervical / Trunk Assessment: Normal   Communication Communication Communication: No difficulties   Cognition Arousal/Alertness: Awake/alert Behavior During Therapy: WFL for tasks assessed/performed, Impulsive Overall Cognitive Status: Impaired/Different from baseline Area of Impairment: Attention, Memory, Safety/judgement, Awareness, Problem solving                     Memory: Decreased short-term memory   Safety/Judgement: Decreased  awareness of safety, Decreased awareness of deficits Awareness: Intellectual Problem Solving: Decreased initiation General Comments: After a lengthy discussion re: why he should avoid wearing R prosthesis at this time, and LLE needs to heal before he can use a prosthesis, pt states he can't have a wheelchair in his home, and he plans to use cruthches and R prosthesis (?)     General Comments  Pt recounts his multiple hospital visits/CVAs, difficulties with thromboembolisms; he describes why he has been unable to get to prosthetist for fitcheck; As pain meds began to take effect, he was more able to move in the bed    Exercises Exercises: Other exercises Other Exercises Other Exercises: L hip extension into bolster on bed x5   Shoulder Instructions      Home Living Family/patient expects to be discharged to:: Private residence Living Arrangements: Other relatives (lives with younger brother) Available Help at Discharge: Family;Available PRN/intermittently Type of Home: House Home Access: Stairs to enter [employer] of Steps: 4 Entrance Stairs-Rails: Right Home Layout: One level     Bathroom Shower/Tub: Tub/shower unit         Home Equipment: International aid/development worker (2 wheels);Crutches          Prior Functioning/Environment Prior Level of Function : Independent/Modified Independent             Mobility Comments: crutches and prosthesis ADLs Comments: sits on seat and then  brings legs into shower        OT Problem List: Decreased strength;Decreased activity tolerance;Impaired balance (sitting and/or standing);Impaired vision/perception;Decreased coordination;Decreased cognition;Decreased safety awareness;Decreased knowledge of use of DME or AE;Decreased knowledge of precautions;Pain;Impaired sensation      OT Treatment/Interventions: Self-care/ADL training;Therapeutic exercise;Balance training;Patient/family education;Cognitive  remediation/compensation;Visual/perceptual remediation/compensation;Therapeutic activities;DME and/or AE instruction;Neuromuscular education    OT Goals(Current goals can be found in the care plan section) Acute Rehab OT Goals Patient Stated Goal: no pain OT Goal Formulation: With patient Time For Goal Achievement: 10/14/22 Potential to Achieve Goals: Good ADL Goals Pt Will Perform Grooming: with modified independence;sitting Pt Will Perform Upper Body Dressing: with modified independence;sitting Pt Will Perform Lower Body Dressing: with modified independence;sitting/lateral leans Pt Will Transfer to Toilet: bedside commode;with transfer board;anterior/posterior transfer;with min assist Additional ADL Goal #1: Pt will perform bed mobility with mod I Additional ADL Goal #2: Pt will transfer to wheelchair with min A  OT Frequency: Min 2X/week    Co-evaluation   Reason for Co-Treatment: To address functional/ADL transfers;Other (comment) (pt-centered decision to see simultaneously due to projected activity tolerance) PT goals addressed during session: Mobility/safety with mobility        AM-PAC OT "6 Clicks" Daily Activity     Outcome Measure Help from another person eating meals?: None Help from another person taking care of personal grooming?: None Help from another person toileting, which includes using toliet, bedpan, or urinal?: A Lot Help from another person bathing (including washing, rinsing, drying)?: A Little Help from another person to put on and taking off regular upper body clothing?: A Little Help from another person to put on and taking off regular lower body clothing?: A Lot 6 Click Score: 18   End of Session Equipment Utilized During Treatment:  (LLE limb guard) Nurse Communication: Mobility status  Activity Tolerance: Patient tolerated treatment well Patient left: in bed;with call bell/phone within reach;with bed alarm set;with family/visitor present  OT Visit  Diagnosis: Unsteadiness on feet (R26.81);Muscle weakness (generalized) (M62.81);Other abnormalities of gait and mobility (R26.89);Other symptoms and signs involving cognitive function;Low vision, both eyes (H54.2);Pain Pain - Right/Left: Left Pain - part of body: Leg;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

## 2022-09-30 NOTE — Progress Notes (Signed)
HD#1 Subjective:   Summary: Gregory May is a 62 y.o. 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   Overnight Events: None  This morning patient is doing all right.  He states that he does have some worse pain in his leg but the pain medication is mostly helping.  He denies any pain up in his thigh.  He does feel a twitching sensation in his left calf.  He did have phantom pains with his right BKA and still occasionally does.  He states these are lasting longer than his regular phantom pains.  Objective:  Vital signs in last 24 hours: Vitals:   09/29/22 0922 09/29/22 1259 09/29/22 2013 09/30/22 0440  BP: (!) 104/52 (!) 101/56 (!) 109/55 106/66  Pulse: (!) 58 (!) 56 (!) 54 (!) 47  Resp: 12 17 16 17  $ Temp:  97.7 F (36.5 C) 97.9 F (36.6 C) 97.6 F (36.4 C)  TempSrc:  Oral Oral Oral  SpO2: 98% 95% 97% 95%   Supplemental O2: Room Air SpO2: 95 % O2 Flow Rate (L/min): 2 L/min   Physical Exam:  Constitutional: Elderly male laying in bed, in no acute distress Cardiovascular: regular rate and rhythm Pulmonary/Chest: normal work of breathing on room air MSK: Stable right BKA with stump sock and sleeve on.  S/p left BKA with bandage sock and splint overlying Neurological: alert & oriented x 3, No focal deficits noted Skin: warm and dry   Intake/Output Summary (Last 24 hours) at 09/30/2022 0647 Last data filed at 09/30/2022 0600 Gross per 24 hour  Intake 2675.98 ml  Output 3550 ml  Net -874.02 ml   Net IO Since Admission: -984.72 mL [09/30/22 0647]  Pertinent Labs:    Latest Ref Rng & Units 09/30/2022    4:06 AM 09/29/2022    5:52 AM 09/28/2022   10:26 PM  CBC  WBC 4.0 - 10.5 K/uL 14.0  16.6  19.2    Hemoglobin 13.0 - 17.0 g/dL 11.8  12.8  14.0   Hematocrit 39.0 - 52.0 % 36.3  38.1  43.3   Platelets 150 - 400 K/uL 325  314  354        Latest Ref Rng & Units 09/30/2022    4:06 AM 09/29/2022    5:52 AM 09/28/2022   10:26 PM  CMP  Glucose 70 - 99 mg/dL 119  109  179   BUN 6 - 20 mg/dL 20  19  24   $ Creatinine 0.61 - 1.24 mg/dL 0.69  0.84  0.92   Sodium 135 - 145 mmol/L 140  134  135   Potassium 3.5 - 5.1 mmol/L 3.9  3.5  3.8   Chloride 98 - 111 mmol/L 104  99  99   CO2 22 - 32 mmol/L 26  25  24   $ Calcium 8.9 - 10.3 mg/dL 8.6  8.2  8.6   Total Protein 6.5 - 8.1 g/dL   7.0   Total Bilirubin 0.3 - 1.2 mg/dL   0.3   Alkaline Phos 38 - 126 U/L   78   AST 15 - 41 U/L   40   ALT 0 - 44 U/L   36     Assessment/Plan:   Principal Problem:  Lower extremity cellulitis Active Problems:   Cutaneous abscess of left foot   Acute osteomyelitis of left foot (HCC)   Pressure injury of skin   Patient Summary: Gregory May is a 62 y.o. 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  No signs of systemic infection either.  Leukocytosis is improving from 16-14.  He will work with PT and OT today and hopefully start rehabilitation soon. 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  Has been recommended to use [place] 6 units daily as well as to get a freestyle libre.  Will monitor sugars and if uncontrolled will add this. - Continue home pioglitazone 30 mg daily, Jardiance 25 mg daily, and metformin 750 mg twice daily - SSI  History of central retinal artery occlusion Previous DVTs Chronic warfarin therapy INR goal 2-3.  INR 3.4 today. - Warfarin per pharmacy  Hyperlipidemia Crestor 20 daily  Diet: Carb-Modified IVF: NS,75cc/hr VTE:  Warfarin Code: Full PT/OT recs: Pending   Dispo: Pending PT evaluation and continued postop healing  Johny Blamer, DO Internal Medicine Resident PGY-1 Pager: [phone]  Please contact the on call pager after 5 pm and on weekends at [phone].

## 2022-09-30 NOTE — Progress Notes (Signed)
ANTICOAGULATION CONSULT NOTE - Follow Up Consult  Pharmacy Consult for Warfarin Indication: DVT  No Known Allergies  Vital Signs: Temp: 97.6 F (36.4 C) (02/18 0738) Temp Source: Oral (02/18 0738) BP: 122/59 (02/18 0738) Pulse Rate: 60 (02/18 0738)  Labs: Recent Labs    09/28/22 2226 09/29/22 0552 09/30/22 0406  HGB 14.0 12.8* 11.8*  HCT 43.3 38.1* 36.3*  PLT 354 314 325  LABPROT 29.7* 33.5* 33.8*  INR 2.9* 3.3* 3.4*  CREATININE 0.92 0.84 0.69     CrCl cannot be calculated (Unknown ideal weight.).  Assessment: 62 year old s/p BKA this admission to resume warfarin for DVT/retinal artery occlusion, INR today 3.4  INR on admission 2.9, last dose 2/16  Dose PTA - 10 mg MWF, 12.5 mg TThSS  Goal of Therapy:  INR 2-3 Monitor platelets by anticoagulation protocol: Yes   Plan:  Warfarin 1 mg po x 1 dose today (small doses to prevent drop to < 2 given PTA dose) Daily INR  Thank you Anette Guarneri, PharmD  09/30/2022,10:39 AM

## 2022-10-01 ENCOUNTER — Ambulatory Visit: Payer: Medicare HMO

## 2022-10-01 ENCOUNTER — Encounter (HOSPITAL_COMMUNITY): Payer: Self-pay | Admitting: Orthopedic Surgery

## 2022-10-01 DIAGNOSIS — L03116 Cellulitis of left lower limb: Secondary | ICD-10-CM | POA: Diagnosis not present

## 2022-10-01 DIAGNOSIS — Z89512 Acquired absence of left leg below knee: Secondary | ICD-10-CM | POA: Diagnosis not present

## 2022-10-01 LAB — CBC
HCT: 39.4 % (ref 39.0–52.0)
Hemoglobin: 12.8 g/dL — ABNORMAL LOW (ref 13.0–17.0)
MCH: 30.1 pg (ref 26.0–34.0)
MCHC: 32.5 g/dL (ref 30.0–36.0)
MCV: 92.7 fL (ref 80.0–100.0)
Platelets: 369 10*3/uL (ref 150–400)
RBC: 4.25 MIL/uL (ref 4.22–5.81)
RDW: 14.2 % (ref 11.5–15.5)
WBC: 12.6 10*3/uL — ABNORMAL HIGH (ref 4.0–10.5)
nRBC: 0.2 % (ref 0.0–0.2)

## 2022-10-01 LAB — GLUCOSE, CAPILLARY
Glucose-Capillary: 130 mg/dL — ABNORMAL HIGH (ref 70–99)
Glucose-Capillary: 180 mg/dL — ABNORMAL HIGH (ref 70–99)
Glucose-Capillary: 166 mg/dL — ABNORMAL HIGH (ref 70–99)
Glucose-Capillary: 164 mg/dL — ABNORMAL HIGH (ref 70–99)
Glucose-Capillary: 120 mg/dL — ABNORMAL HIGH (ref 70–99)
Glucose-Capillary: 133 mg/dL — ABNORMAL HIGH (ref 70–99)

## 2022-10-01 LAB — PROTIME-INR
INR: 2 — ABNORMAL HIGH (ref 0.8–1.2)
Prothrombin Time: 22.8 seconds — ABNORMAL HIGH (ref 11.4–15.2)

## 2022-10-01 MED ORDER — CHLORHEXIDINE GLUCONATE CLOTH 2 % EX PADS
6.0000 | MEDICATED_PAD | Freq: Every day | CUTANEOUS | Status: DC
  Administered 2022-10-01 – 2022-10-03 (×3): 6 via TOPICAL

## 2022-10-01 MED ORDER — MUPIROCIN 2 % EX OINT
1.0000 | TOPICAL_OINTMENT | Freq: Two times a day (BID) | CUTANEOUS | Status: DC
  Administered 2022-10-01 – 2022-10-03 (×5): 1 via NASAL
  Filled 2022-10-01 (×2): qty 22

## 2022-10-01 MED ORDER — WARFARIN SODIUM 5 MG PO TABS
10.0000 mg | ORAL_TABLET | Freq: Once | ORAL | Status: AC
  Administered 2022-10-01: 10 mg via ORAL
  Filled 2022-10-01: qty 2

## 2022-10-01 MED ORDER — GABAPENTIN 400 MG PO CAPS
400.0000 mg | ORAL_CAPSULE | Freq: Three times a day (TID) | ORAL | Status: DC
  Administered 2022-10-01 – 2022-10-03 (×7): 400 mg via ORAL
  Filled 2022-10-01 (×7): qty 1

## 2022-10-01 NOTE — TOC Initial Note (Addendum)
Transition of Care [facility]) - Initial/Assessment Note    Patient Details  Name: Gregory May MRN: [id number] Date of Birth: Mar 17, 1961  Transition of Care [facility]) CM/SW Contact:    Joanne Chars, LCSW Phone Number: 10/01/2022, 11:31 AM  Clinical Narrative:         CSW met with pt regarding DC recommendation for SNF.  Pt lives with brother, no current services.  Permission given to speak with aunt Bevely Palmer.  Pt reports he went to CIR after last surgery, would prefer to return there.  CSW spoke with Laura/CIR who did screen pt, found him to be not a candidate for CIR (see her note)    CSW spoke with pt again, updated on CIR decline, he is now agreeable to SNF referral.  Medicare choice document given.  No preference indicated.  Sent out for SNF.         [id number]: Bed offers presented.  Pt and brothers in room, will review   Expected Discharge Plan: Skilled Nursing Facility Barriers to Discharge: Continued Medical Work up, SNF Pending bed offer   Patient Goals and CMS Choice Patient states their goals for this hospitalization and ongoing recovery are:: get blood sugar back on track CMS Medicare.gov Compare Post Acute Care list provided to:: Patient Choice offered to / list presented to : Patient      Expected Discharge Plan and Services In-house Referral: Clinical Social Work   Post Acute Care Choice: [place] Living arrangements for the past 2 months: [place]                                      Prior Living Arrangements/Services Living arrangements for the past 2 months: Single Family Home Lives with:: Relatives (with brother) Patient language and need for interpreter reviewed:: Yes Do you feel safe going back to the place where you live?: Yes      Need for Family Participation in Patient Care: No (Comment) Care giver support system in place?: Yes (comment) Current home services: Other (comment) (none) Criminal Activity/Legal  Involvement Pertinent to Current Situation/Hospitalization: No - Comment as needed  Activities of Daily Living      Permission Sought/Granted Permission sought to share information with : Family Supports Permission granted to share information with : Yes, Verbal Permission Granted  Share Information with NAME: Verner Chol  Permission granted to share info w AGENCY: SNF        Emotional Assessment Appearance:: Appears older than stated age Attitude/Demeanor/Rapport: Engaged Affect (typically observed): Appropriate, Pleasant Orientation: : Oriented to Self, Oriented to Place, Oriented to  Time, Oriented to Situation      Admission diagnosis:  Cellulitis of left lower extremity [L03.116] Lower extremity cellulitis [L03.119] Patient Active Problem List   Diagnosis Date Noted   Lower extremity cellulitis 09/29/2022   Acute osteomyelitis of left foot ([place]) 09/29/2022   Pressure injury of skin 09/29/2022   PFO with atrial septal aneurysm    Long term (current) use of anticoagulants 02/26/2022   Right retinal embolus 01/15/2022   DM type 2 with diabetic peripheral neuropathy ([place]) 01/15/2022   Obstructive sleep apnea 01/15/2022   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   Transportation insecurity 11/23/2021   Retinal artery occlusion, central 11/15/2021   Epidermal inclusion cyst 10/18/2021   Acute postoperative pain 09/05/2021   Amputated finger 09/05/2021   Osteomyelitis of finger of right hand (  [place]) 08/19/2021   Cellulitis of finger of right hand 08/17/2021   Osteomyelitis of second toe of left foot ([place])    Hepatic steatosis 06/24/2020   Back pain 04/20/2020   Flu vaccine need 04/20/2020   COVID-19 virus vaccination declined 04/20/2020   Left knee pain 04/20/2020   Allergic sinusitis 04/20/2020   Elevated liver enzymes 04/20/2020   Cutaneous abscess of left foot    Diastasis of rectus abdominis 07/16/2019   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 10/07/2018   Essential hypertension 06/19/2018   Peripheral neuropathy 12/19/2017   Left below-knee amputee ([place]) 04/17/2017   Carpal tunnel syndrome 03/18/2017   Colon cancer screening 06/21/2015   Status post below knee amputation of right lower extremity ([place]) 06/07/2015   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 ([place]) 06/01/2015   Diabetic polyneuropathy associated with type 2 diabetes mellitus ([place]) 12/09/2014   Diabetes mellitus with carpal tunnel syndrome ([place]) 12/09/2014   DJD (degenerative joint disease) of knee 07/21/2014   Osteoarthritis, hip, bilateral 05/25/2014   Onychomycosis 10/14/2013   Pure hypercholesterolemia 09/25/2013   Diabetic foot ulcer ([place]) 09/15/2013   Poorly controlled type 2 diabetes mellitus with complication ([place]) [id number]   PCP:  Idamae Schuller, MD Pharmacy:   [place] [facility], [address] [place] [address] Phone: [phone] Fax: [phone]  [facility] [place], [place] [address] Phone: [phone] Fax: [phone]     Social Determinants of Health (SDOH) Social History: SDOH Screenings   Food Insecurity: No Food Insecurity (03/29/2022)  Housing: Low Risk  (12/15/2021)  Transportation Needs: No Transportation Needs (03/29/2022)  Alcohol Screen: Low Risk  (12/15/2021)  Depression (PHQ2-9): Low Risk  (03/29/2022)  Financial Resource Strain: Low Risk  (12/15/2021)  Physical Activity: Inactive (12/15/2021)  Social Connections: Socially Isolated (12/15/2021)  Stress: No Stress Concern Present (12/15/2021)  Tobacco Use: Low Risk  (09/29/2022)   SDOH Interventions:     Readmission Risk Interventions     No data to display

## 2022-10-01 NOTE — Progress Notes (Signed)
Inpatient Rehab Admissions Coordinator:   Per TOC and pt. request patient was screened for CIR candidacy by Clemens Catholic, MS, CCC-SLP. Per current payor trends, Pt.'s payor is very unlikley to approve this admit for BKA. I will not pursue consult. Recommend TOC look toward other rehab venues.  Please contact me any with questions.  Clemens Catholic, [place], [place] Admissions Coordinator  [phone] ([place]) [phone] (office)

## 2022-10-01 NOTE — NC FL2 (Signed)
[place] MEDICAID FL2 LEVEL OF CARE FORM     IDENTIFICATION  Patient Name: Gregory May Birthdate: 1961-04-06 Sex: male Admission Date (Current Location): 09/28/2022  [facility] and [place] Number:  Herbalist and Address:  [facility], [place] [address], [place], [name] [address]      Provider Number: [id number]  Attending Physician Name and Address:  Angelica Pou, MD  Relative Name and Phone Number:  Gregory May Aunt [phone]  [phone]    Current Level of Care: Hospital Recommended Level of Care: [place] Prior Approval Number:    Date Approved/Denied:   PASRR Number: [id number]  Discharge Plan: SNF    Current Diagnoses: Patient Active Problem List   Diagnosis Date Noted   Lower extremity cellulitis 09/29/2022   Acute osteomyelitis of left foot ([place]) 09/29/2022   Pressure injury of skin 09/29/2022   PFO with atrial septal aneurysm    Long term (current) use of anticoagulants 02/26/2022   Right retinal embolus 01/15/2022   DM type 2 with diabetic peripheral neuropathy ([place]) 01/15/2022   Obstructive sleep apnea 01/15/2022   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   Transportation insecurity 11/23/2021   Retinal artery occlusion, central 11/15/2021   Epidermal inclusion cyst 10/18/2021   Acute postoperative pain 09/05/2021   Amputated finger 09/05/2021   Osteomyelitis of finger of right hand ([place]) 08/19/2021   Cellulitis of finger of right hand 08/17/2021   Osteomyelitis of second toe of left foot ([place])    Hepatic steatosis 06/24/2020   Back pain 04/20/2020   Flu vaccine need 04/20/2020   COVID-19 virus vaccination declined 04/20/2020   Left knee pain 04/20/2020   Allergic sinusitis 04/20/2020   Elevated liver enzymes 04/20/2020   Cutaneous abscess of left foot    Diastasis of rectus abdominis 07/16/2019   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  10/07/2018   Essential hypertension 06/19/2018   Peripheral neuropathy 12/19/2017   Left below-knee amputee ([place]) 04/17/2017   Carpal tunnel syndrome 03/18/2017   Colon cancer screening 06/21/2015   Status post below knee amputation of right lower extremity ([place]) 06/07/2015   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 ([place]) 06/01/2015   Diabetic polyneuropathy associated with type 2 diabetes mellitus ([place]) 12/09/2014   Diabetes mellitus with carpal tunnel syndrome ([place]) 12/09/2014   DJD (degenerative joint disease) of knee 07/21/2014   Osteoarthritis, hip, bilateral 05/25/2014   Onychomycosis 10/14/2013   Pure hypercholesterolemia 09/25/2013   Diabetic foot ulcer ([place]) 09/15/2013   Poorly controlled type 2 diabetes mellitus with complication ([place]) [id number]    Orientation RESPIRATION BLADDER Height & Weight     Self, Time, Situation, Place  Normal Continent Weight:   Height:     BEHAVIORAL SYMPTOMS/MOOD NEUROLOGICAL BOWEL NUTRITION STATUS      Continent Diet (see discharge summary)  AMBULATORY STATUS COMMUNICATION OF NEEDS Skin   Total Care Verbally Surgical wounds                       Personal Care Assistance Level of Assistance  Bathing, Feeding, Dressing Bathing Assistance: Limited assistance Feeding assistance: Independent Dressing Assistance: Limited assistance     Functional Limitations Info  Sight, Hearing, Speech Sight Info: Adequate Hearing Info: Adequate Speech Info: Adequate    SPECIAL CARE FACTORS FREQUENCY  PT (By licensed PT), OT (By licensed OT)     PT Frequency: 5x week OT Frequency: 5x week  Contractures Contractures Info: Not present    Additional Factors Info  Code Status, Allergies, Insulin Sliding Scale Code Status Info: full Allergies Info: NKA   Insulin Sliding Scale Info: Novolog: see discharge summary       Current Medications (10/01/2022):  This is the current hospital active medication list Current  Facility-Administered Medications  Medication Dose Route Frequency Provider Last Rate Last Admin   0.9 %  sodium chloride infusion   Intravenous Continuous [name], MD 75 mL/hr at 09/29/22 2023 New Bag at 09/29/22 2023   acetaminophen (TYLENOL) tablet 325-650 mg  325-650 mg Oral Q6H PRN [name], MD   650 mg at 09/30/22 1414   alum & mag hydroxide-simeth (MAALOX/MYLANTA) 200-200-20 MG/5ML suspension 15-30 mL  15-30 mL Oral Q2H PRN [name], MD       ascorbic acid (VITAMIN C) tablet 1,000 mg  1,000 mg Oral Daily [name], MD   1,000 mg at 10/01/22 0943   bisacodyl (DULCOLAX) EC tablet 5 mg  5 mg Oral Daily PRN [name], MD       docusate sodium (COLACE) capsule 100 mg  100 mg Oral Daily [name], MD   100 mg at 10/01/22 0944   empagliflozin (JARDIANCE) tablet 25 mg  25 mg Oral Daily Johny Blamer, DO   25 mg at 10/01/22 0944   gabapentin (NEURONTIN) capsule 400 mg  400 mg Oral TID Johny Blamer, DO       guaiFENesin-dextromethorphan (ROBITUSSIN DM) 100-10 MG/5ML syrup 15 mL  15 mL Oral Q4H PRN [name], MD       hydrALAZINE (APRESOLINE) injection 5 mg  5 mg Intravenous Q20 Min PRN [name], MD       HYDROmorphone (DILAUDID) injection 0.5-1 mg  0.5-1 mg Intravenous Q3H PRN Delene Ruffini, MD   1 mg at 10/01/22 0131   insulin aspart (novoLOG) injection 0-9 Units  0-9 Units Subcutaneous Q4H [name], MD   2 Units at 10/01/22 0944   labetalol (NORMODYNE) injection 10 mg  10 mg Intravenous Q10 min PRN [name], MD       magnesium citrate solution 1 Bottle  1 Bottle Oral Once PRN [name], MD       magnesium sulfate IVPB 2 g 50 mL  2 g Intravenous Daily PRN [name], MD       metFORMIN (GLUCOPHAGE-XR) 24 hr tablet 750 mg  750 mg Oral BID WC Johny Blamer, DO   750 mg at 10/01/22 0943   metoprolol tartrate (LOPRESSOR) injection 2-5 mg  2-5 mg Intravenous Q2H PRN [name], MD       nutrition supplement (JUVEN) (JUVEN)  powder packet 1 packet  1 packet Oral BID BM [name], MD   1 packet at 10/01/22 0944   ondansetron (ZOFRAN) injection 4 mg  4 mg Intravenous Q6H PRN [name], MD   4 mg at 10/01/22 1124   oxyCODONE (Oxy IR/ROXICODONE) immediate release tablet 10-15 mg  10-15 mg Oral Q4H PRN [name], MD   15 mg at 10/01/22 [id number]   oxyCODONE (Oxy IR/ROXICODONE) immediate release tablet 5-10 mg  5-10 mg Oral Q4H PRN [name], MD       pantoprazole (PROTONIX) EC tablet 40 mg  40 mg Oral Daily [name], MD   40 mg at 10/01/22 0943   phenol (CHLORASEPTIC) mouth spray 1 spray  1 spray Mouth/Throat  PRN [name], MD       pioglitazone (ACTOS) tablet 30 mg  30 mg Oral Daily Johny Blamer, DO   30 mg at 10/01/22 0944   polyethylene glycol (MIRALAX / GLYCOLAX) packet 17 g  17 g Oral Daily PRN [name], MD       potassium chloride SA (KLOR-CON M) CR tablet 20-40 mEq  20-40 mEq Oral Daily PRN [name], MD       prednisoLONE acetate (PRED FORTE) 1 % ophthalmic suspension 1 drop  1 drop Right Eye QPM Johny Blamer, DO   1 drop at 09/30/22 1737   rosuvastatin (CRESTOR) tablet 20 mg  20 mg Oral Daily Johny Blamer, DO   20 mg at 10/01/22 0944   senna-docusate (Senokot-S) tablet 1 tablet  1 tablet Oral QHS PRN [name], MD       warfarin (COUMADIN) tablet 10 mg  10 mg Oral ONCE-1600 Dimple Nanas, [facility]       Warfarin - Pharmacist Dosing Inpatient   Does not apply [id number] Sid Falcon, MD   Given at 09/30/22 1737   zinc sulfate capsule 220 mg  220 mg Oral Daily [name], MD   220 mg at 10/01/22 [id number]     Discharge Medications: Please see discharge summary for a list of discharge medications.  Relevant Imaging Results:  Relevant Lab Results:   Additional Information SSN: [id number]. Pt is not vaccinated for covid.  Joanne Chars, LCSW

## 2022-10-01 NOTE — Progress Notes (Signed)
ANTICOAGULATION CONSULT NOTE - Follow Up Consult  Pharmacy Consult for Warfarin Indication: DVT, retinal artery occlusion  No Known Allergies  Vital Signs: Temp: 98.1 F (36.7 C) (02/18 2011) Temp Source: Oral (02/18 2011) BP: 128/92 (02/18 2011) Pulse Rate: 85 (02/18 2011)  Labs: Recent Labs    09/28/22 2226 09/29/22 0552 09/30/22 0406 10/01/22 0502  HGB 14.0 12.8* 11.8* 12.8*  HCT 43.3 38.1* 36.3* 39.4  PLT 354 314 325 369  LABPROT 29.7* 33.5* 33.8* 22.8*  INR 2.9* 3.3* 3.4* 2.0*  CREATININE 0.92 0.84 0.69  --     CrCl cannot be calculated (Unknown ideal weight.).  Assessment: 62 year old s/p BKA this admission to resume warfarin on 2/17 for DVT/retinal artery occlusion, dose PTA - 10 mg MWF, 12.5 mg TThSS. Last dose PTA 2/16.   INR therapeutic at 2 (large drop from 3.4 >> 2). CBC stable  Goal of Therapy:  INR 2-3 Monitor platelets by anticoagulation protocol: Yes   Plan:  Warfarin 47m PO x1 this evening (home dose) Daily INR  MDimple Nanas PharmD, BCPS 10/01/2022 6:26 AM

## 2022-10-01 NOTE — Progress Notes (Addendum)
HD#2 Subjective:   Summary: Gregory May is a 62 y.o. 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   Overnight Events: None  Pt is having persistent pain that is partially responsive to prn opiates. He denies any worsening of his pain or any pain proximal to the tibial plateau. He is having some nausea after breakfast but has otherwise been tolerating PO intake well. He denies any other new or worsening issues.  He did have a bowel movement today.  Objective:  Vital signs in last 24 hours: Vitals:   09/30/22 0440 09/30/22 0738 09/30/22 1436 09/30/22 2011  BP: 106/66 (!) 122/59 (!) 158/88 (!) 128/92  Pulse: (!) 47 60 78 85  Resp: 17 18 18   $ Temp: 97.6 F (36.4 C) 97.6 F (36.4 C) 98 F (36.7 C) 98.1 F (36.7 C)  TempSrc: Oral Oral Oral Oral  SpO2: 95% 97% 95% 95%   Supplemental O2: Room Air SpO2: 95 % O2 Flow Rate (L/min): 2 L/min   Physical Exam:  Constitutional: Elderly male laying in bed, in no acute distress Cardiovascular: regular rate and rhythm Pulmonary/Chest: normal work of breathing on room air, CTA bilaterally MSK: Stable right BKA with stump sock and sleeve on.  S/p left BKA with bandage sock and splint overlying, no tenderness, erythema, or edema of the knee or proximal Neurological: alert & oriented x 3, No focal deficits noted Skin: warm and dry, 2-3 cm firm, mobile, and non-tender sebaceous cyst on right upper back   Intake/Output Summary (Last 24 hours) at 10/01/2022 0703 Last data filed at 09/30/2022 0900 Gross per 24 hour  Intake 240 ml  Output --  Net 240 ml   Net IO Since Admission: -1,444.72 mL [10/01/22 0703]  Pertinent Labs:    Latest Ref Rng & Units 10/01/2022    5:02 AM  09/30/2022    4:06 AM 09/29/2022    5:52 AM  CBC  WBC 4.0 - 10.5 K/uL 12.6  14.0  16.6   Hemoglobin 13.0 - 17.0 g/dL 12.8  11.8  12.8   Hematocrit 39.0 - 52.0 % 39.4  36.3  38.1   Platelets 150 - 400 K/uL 369  325  314        Latest Ref Rng & Units 09/30/2022    4:06 AM 09/29/2022    5:52 AM 09/28/2022   10:26 PM  CMP  Glucose 70 - 99 mg/dL 119  109  179   BUN 6 - 20 mg/dL 20  19  24   $ Creatinine 0.61 - 1.24 mg/dL 0.69  0.84  0.92   Sodium 135 - 145 mmol/L 140  134  135   Potassium 3.5 - 5.1 mmol/L 3.9  3.5  3.8   Chloride 98 - 111 mmol/L 104  99  99   CO2 22 - 32 mmol/L 26  25  24   $ Calcium 8.9 - 10.3 mg/dL 8.6  8.2  8.6   Total Protein 6.5 - 8.1 g/dL   7.0   Total Bilirubin 0.3 - 1.2 mg/dL   0.3   Alkaline Phos 38 - 126 U/L   78   AST 15 - 41 U/L   40   ALT 0 - 44 U/L   36  Assessment/Plan:   Principal Problem:   Lower extremity cellulitis Active Problems:   Left below-knee amputee (HCC)   Cutaneous abscess of left foot   Acute osteomyelitis of left foot (HCC)   Pressure injury of skin   Patient Summary: Gregory May is a 62 y.o. male with pertinent PMH of type 2 diabetes with neuropathy, prior DVT and retinal artery occlusion on warfarin, hepatic steatosis, and prior right BKA presenting with fever and left lower extremity pain associated with left heel ulcer and is admitted for left lower extremity cellulitis s/p left BKA 09/29/2022, on hospital day 2   Left lower extremity ascending cellulitis S/p left BKA 09/29/2022 Continues to have bothersome pain but has not yet maximized pain medication available.  Continues to have good bowel function.  PT/OT recommending SNF. 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  Has been recommended to use [place] 6 units daily as well as to get a freestyle libre.  Will monitor sugars and if uncontrolled will add this. - Continue home pioglitazone 30 mg daily, Jardiance 25 mg daily, and metformin 750 mg twice daily - SSI  History of central retinal artery occlusion Previous DVTs Chronic warfarin therapy INR goal 2-3.  INR 2 today. - Warfarin per pharmacy  Hyperlipidemia Crestor 20 daily  Diet: Carb-Modified IVF: None VTE:  Warfarin Code: Full PT/OT recs: Pending   Dispo: Pending continued postop recovery and pain control.  Also pending SNF placement.  Johny Blamer, DO Internal Medicine Resident PGY-1 Pager: [phone]  Please contact the on call pager after 5 pm and on weekends at [phone].

## 2022-10-02 ENCOUNTER — Encounter (HOSPITAL_COMMUNITY): Payer: Self-pay | Admitting: Certified Registered"

## 2022-10-02 ENCOUNTER — Ambulatory Visit: Payer: Medicare HMO | Admitting: Orthopedic Surgery

## 2022-10-02 ENCOUNTER — Inpatient Hospital Stay (HOSPITAL_COMMUNITY): Payer: Self-pay | Admitting: Certified Registered"

## 2022-10-02 DIAGNOSIS — L03116 Cellulitis of left lower limb: Secondary | ICD-10-CM | POA: Diagnosis not present

## 2022-10-02 DIAGNOSIS — Z89512 Acquired absence of left leg below knee: Secondary | ICD-10-CM | POA: Diagnosis not present

## 2022-10-02 LAB — GLUCOSE, CAPILLARY
Glucose-Capillary: 134 mg/dL — ABNORMAL HIGH (ref 70–99)
Glucose-Capillary: 135 mg/dL — ABNORMAL HIGH (ref 70–99)
Glucose-Capillary: 148 mg/dL — ABNORMAL HIGH (ref 70–99)
Glucose-Capillary: 179 mg/dL — ABNORMAL HIGH (ref 70–99)
Glucose-Capillary: 201 mg/dL — ABNORMAL HIGH (ref 70–99)
Glucose-Capillary: 212 mg/dL — ABNORMAL HIGH (ref 70–99)

## 2022-10-02 LAB — PROTIME-INR
INR: 1.4 — ABNORMAL HIGH (ref 0.8–1.2)
Prothrombin Time: 17.3 seconds — ABNORMAL HIGH (ref 11.4–15.2)

## 2022-10-02 MED ORDER — INSULIN ASPART 100 UNIT/ML IJ SOLN
0.0000 [IU] | Freq: Three times a day (TID) | INTRAMUSCULAR | Status: DC
  Administered 2022-10-03 (×2): 2 [IU] via SUBCUTANEOUS

## 2022-10-02 MED ORDER — WARFARIN SODIUM 7.5 MG PO TABS
12.5000 mg | ORAL_TABLET | Freq: Once | ORAL | Status: AC
  Administered 2022-10-02: 12.5 mg via ORAL
  Filled 2022-10-02: qty 1

## 2022-10-02 NOTE — Discharge Instructions (Addendum)
Gregory May,  You were recently admitted to [facility] for infection in your left heel that required a left below the knee amputation.   Continue taking your home medications with the following changes  Start taking Oxycodone 15 mg every 4 hours as needed for severe pain, we will send you with a 7-day course   You should seek further medical care if you have any signs of infection including fevers, chills, severe fatigue, drainage from your surgical wound, redness around your surgical wound, swelling around the surgical wound, or increased pain of any surgical wound or up your leg.  We recommend that you see your primary care doctor in about a week to make sure that you continue to improve. We are so glad that you are feeling better.  Sincerely, Johny Blamer, DO

## 2022-10-02 NOTE — Progress Notes (Signed)
Patient ID: Gregory May, male   DOB: 20-Sep-1960, 62 y.o.   MRN: [id number] Patient is status post left transtibial amputation.  There is no drainage in the wound VAC canister there is a good suction fit.  Patient is planning for discharge to skilled nursing facility.  Plan for discharge with the Praveena plus portable wound VAC pump.

## 2022-10-02 NOTE — Progress Notes (Signed)
ANTICOAGULATION CONSULT NOTE - Follow Up Consult  Pharmacy Consult for Warfarin Indication: DVT, retinal artery occlusion  No Known Allergies  Vital Signs: Temp: 97.9 F (36.6 C) (02/20 0119) Temp Source: Oral (02/20 0119) BP: 121/86 (02/20 0119) Pulse Rate: 78 (02/20 0119)  Labs: Recent Labs    09/30/22 0406 10/01/22 0502 10/02/22 0210  HGB 11.8* 12.8*  --   HCT 36.3* 39.4  --   PLT 325 369  --   LABPROT 33.8* 22.8* 17.3*  INR 3.4* 2.0* 1.4*  CREATININE 0.69  --   --     CrCl cannot be calculated (Unknown ideal weight.).  Assessment: 62 year old s/p BKA this admission to resume warfarin on 2/17 for DVT/retinal artery occlusion, dose PTA - 10 mg MWF, 12.5 mg TThSS. Last dose PTA 2/16.   INR subtherapeutic at 1.4 (likely due to reduced warfarin doses 2/2 supratherapeutic INR). CBC stable  Goal of Therapy:  INR 2-3 Monitor platelets by anticoagulation protocol: Yes   Plan:  Warfarin 12.5 mg PO x1 this evening (home dose) Daily INR  Dimple Nanas, PharmD, BCPS 10/02/2022 7:16 AM

## 2022-10-02 NOTE — Progress Notes (Signed)
Occupational Therapy Treatment Patient Details Name: Gregory May MRN: [id number] DOB: [birth year] Today's Date: 10/02/2022   History of present illness Gregory May came in with severe LLE cellulitis with planned BKA by Dr. Sharol Given.  has a past medical history of Arthritis, Deep vein thrombosis (DVT) ([place]), Diabetes mellitus, Diabetic ulcer of heel ([place]), DJD (degenerative joint disease), Ear drum perforation, right (03/06/2019), Non-pressure chronic ulcer of right calf, limited to breakdown of skin, Pulmonary emboli ([place])   OT comments  Patient had an excellent session today with great advancement in ADL transfers and LB bathing. He completed a lateral scoot with min A from bed > recliner. He was able to complete LB bathing with lateral leans, only to the R, d/t the L being too painful. Continue to recommend SNF at d/c for further rehab. Pt would benefit from continued acute OT services to facilitate safe d/c home and optimize occupational performance.   Recommendations for follow up therapy are one component of a multi-disciplinary discharge planning process, led by the attending physician.  Recommendations may be updated based on patient status, additional functional criteria and insurance authorization.    Follow Up Recommendations  Skilled nursing-short term rehab (<3 hours/day)     Assistance Recommended at Discharge Intermittent Supervision/Assistance  Patient can return home with the following  Assistance with cooking/housework;Help with stairs or ramp for entrance;Assist for transportation;A little help with walking and/or transfers         Precautions / Restrictions Precautions Precautions: Other (comment) Precaution Comments: R residual limb with small wound; questionable prosthesis fit; Don't recommend to use R prosthesis for standing, as apivot point with lateral scooting should be fine Restrictions Weight Bearing Restrictions: Yes LLE Weight Bearing: Non weight  bearing Other Position/Activity Restrictions: RLE with no ordered weight bearing restrictions, but with small wound residual limb, do not recommend weight bearing at this time       Mobility Bed Mobility Overal bed mobility: Needs Assistance Bed Mobility: Supine to Sit     Supine to sit: Supervision          Transfers Overall transfer level: Needs assistance   Transfers: Bed to chair/wheelchair/BSC            Lateral/Scoot Transfers: Min assist       Balance Overall balance assessment: Needs assistance Sitting-balance support: Single extremity supported Sitting balance-Leahy Scale: Fair   Postural control: Posterior lean                                 ADL either performed or assessed with clinical judgement   ADL Overall ADL's : Needs assistance/impaired             Lower Body Bathing: Minimal assistance;Sitting/lateral leans                         General ADL Comments: Did not initiate stand d/t ill fitting prosthetic      Cognition Arousal/Alertness: Awake/alert Behavior During Therapy: WFL for tasks assessed/performed, Impulsive Overall Cognitive Status: Impaired/Different from baseline                       Memory: Decreased short-term memory   Safety/Judgement: Decreased awareness of safety, Decreased awareness of deficits Awareness: Anticipatory Problem Solving: Slow processing General Comments: Pt much more alert today and engaged in safer d/c planning.  Pertinent Vitals/ Pain       Pain Assessment Pain Assessment: 0-10 Pain Score: 6  Pain Location: L residual limb Pain Descriptors / Indicators: Grimacing, Guarding Pain Intervention(s): Limited activity within patient's tolerance         Frequency  Min 2X/week        Progress Toward Goals  OT Goals(current goals can now be found in the care plan section)  Progress towards OT goals: Progressing toward goals  Acute  Rehab OT Goals OT Goal Formulation: With patient Time For Goal Achievement: 10/14/22 Potential to Achieve Goals: Good  Plan Discharge plan remains appropriate       AM-PAC OT "6 Clicks" Daily Activity     Outcome Measure   Help from another person eating meals?: None Help from another person taking care of personal grooming?: None Help from another person toileting, which includes using toliet, bedpan, or urinal?: A Lot Help from another person bathing (including washing, rinsing, drying)?: A Little Help from another person to put on and taking off regular upper body clothing?: A Little Help from another person to put on and taking off regular lower body clothing?: A Lot 6 Click Score: 18    End of Session Equipment Utilized During Treatment: Gait belt  OT Visit Diagnosis: Unsteadiness on feet (R26.81);Muscle weakness (generalized) (M62.81);Other abnormalities of gait and mobility (R26.89);Other symptoms and signs involving cognitive function;Low vision, both eyes (H54.2);Pain Pain - Right/Left: Left Pain - part of body: Leg;Ankle and joints of foot   Activity Tolerance Patient tolerated treatment well   Patient Left in bed;with call bell/phone within reach;with bed alarm set;with family/visitor present   Nurse Communication Mobility status        Time: [id number] OT Time Calculation (min): 25 min  Charges: OT General Charges $OT Visit: 1 Visit OT Treatments $Self Care/Home Management : 8-22 mins  Gregory May, OTR/L, [facility] Office: [place] 10/02/2022, 12:17 PM

## 2022-10-02 NOTE — Progress Notes (Signed)
HD#3 Subjective:   Summary: Gregory May is a 62 y.o. 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   Overnight Events: None  Patient's pain is a little bit improved today but still bothersome.  It is responsive to current pain regimen.  He is also having some intermittent phantom pain.  He has not had a bowel movement today but did have one yesterday.  He continues to tolerate p.o. intake well.  He does have a little mucus that is tough to get up from his lungs but has not yet asked for his as needed Robitussin.  He also is not using his incentive spirometer but will be doing this now.  Objective:  Vital signs in last 24 hours: Vitals:   09/30/22 1436 09/30/22 2011 10/01/22 0800 10/01/22 2000  BP: (!) 158/88 (!) 128/92 (!) 148/88 111/75  Pulse: 78 85 85 79  Resp: 18  17   Temp: 98 F (36.7 C) 98.1 F (36.7 C) 98.1 F (36.7 C) 97.9 F (36.6 C)  TempSrc: Oral Oral Oral Oral  SpO2: 95% 95% 97% 98%   Supplemental O2: Room Air SpO2: 98 % O2 Flow Rate (L/min): 2 L/min   Physical Exam:  Constitutional: Elderly male laying in bed, in no acute distress Cardiovascular: regular rate and rhythm Pulmonary/Chest: normal work of breathing on room air, CTA bilaterally MSK: Stable right BKA with stump sock and sleeve on.  S/p left BKA with bandage sock and splint overlying, no tenderness, erythema, or edema of the knee or proximal Neurological: alert & oriented x 3, No focal deficits noted Skin: warm and dry, 2-3 cm firm, mobile, and non-tender sebaceous cyst on right upper back   Intake/Output Summary (Last 24 hours) at 10/02/2022 0637 Last data filed at 10/02/2022 [id number] Gross per 24 hour  Intake --  Output 1750 ml   Net -1750 ml   Net IO Since Admission: -3,194.72 mL [10/02/22 0637]  Pertinent Labs:    Latest Ref Rng & Units 10/01/2022    5:02 AM 09/30/2022    4:06 AM 09/29/2022    5:52 AM  CBC  WBC 4.0 - 10.5 K/uL 12.6  14.0  16.6   Hemoglobin 13.0 - 17.0 g/dL 12.8  11.8  12.8   Hematocrit 39.0 - 52.0 % 39.4  36.3  38.1   Platelets 150 - 400 K/uL 369  325  314        Latest Ref Rng & Units 09/30/2022    4:06 AM 09/29/2022    5:52 AM 09/28/2022   10:26 PM  CMP  Glucose 70 - 99 mg/dL 119  109  179   BUN 6 - 20 mg/dL 20  19  24   $ Creatinine 0.61 - 1.24 mg/dL 0.69  0.84  0.92   Sodium 135 - 145 mmol/L 140  134  135   Potassium 3.5 - 5.1 mmol/L 3.9  3.5  3.8   Chloride 98 - 111 mmol/L 104  99  99   CO2 22 - 32 mmol/L 26  25  24   $ Calcium 8.9 - 10.3 mg/dL 8.6  8.2  8.6   Total Protein 6.5 - 8.1 g/dL   7.0   Total Bilirubin 0.3 - 1.2 mg/dL   0.3  Alkaline Phos 38 - 126 U/L   78   AST 15 - 41 U/L   40   ALT 0 - 44 U/L   36     Assessment/Plan:   Principal Problem:   Lower extremity cellulitis Active Problems:   Left below-knee amputee (HCC)   Cutaneous abscess of left foot   Acute osteomyelitis of left foot (HCC)   Pressure injury of skin   Patient Summary: Gregory May is a 62 y.o. male with pertinent PMH of type 2 diabetes with neuropathy, prior DVT and retinal artery occlusion on warfarin, hepatic steatosis, and prior right BKA presenting with fever and left lower extremity pain associated with left heel ulcer and is admitted for left lower extremity cellulitis s/p left BKA 09/29/2022, on hospital day 3   Left lower extremity ascending cellulitis S/p left BKA 09/29/2022 Continues to have bothersome pain but has not yet maximized pain medication available. Continues to have good bowel function.  PT/OT recommending SNF, working with social work, Tax inspector after choosing facility. - Pain regimen per Ortho-oxycodone 5-15 mg every 4 hours as needed for  moderate and severe pain, IV Dilaudid 0.5-1 mg every 4 hours as needed for breakthrough - Gabapentin 400 mg tid - Bowel regimen  Type 2 diabetes with neuropathy S/p right BKA and right index finger transphalangeal amputation A1c 6.9 on 09/27/2022.  Has been recommended to use [place] 6 units daily as well as to get a freestyle libre.  Will monitor sugars and if uncontrolled will add this. - Continue home pioglitazone 30 mg daily, Jardiance 25 mg daily, and metformin 750 mg twice daily - SSI  History of central retinal artery occlusion Previous DVTs Chronic warfarin therapy INR goal 2-3.  INR 1.4 today. - Warfarin per pharmacy  Hyperlipidemia Crestor 20 daily  Diet: Carb-Modified IVF: None VTE:  Warfarin Code: Full PT/OT recs: Pending   Dispo: Pending continued postop recovery and pain control.  Also pending SNF placement.  Johny Blamer, DO Internal Medicine Resident PGY-1 Pager: [phone]  Please contact the on call pager after 5 pm and on weekends at [phone].

## 2022-10-02 NOTE — TOC Progression Note (Addendum)
Transition of Care [facility]) - Progression Note    Patient Details  Name: Gregory May MRN: [id number] Date of Birth: 01/04/1961  [facility] CM/SW Contact  Joanne Chars, LCSW Phone Number: 10/02/2022, 10:42 AM  Clinical Narrative:    CSW spoke with pt regarding bed offers.  Pt still talking about admit to CIR but agreed to look at SNF offers as well.  1030:  CSW spoke with pt again and he is understanding that CIR will not be option, discussed current bed offers.  Pt is asking for responses from [facility], [facility], [facility].  CSW reached out to those facilities.  Auth request submitted in [place] with facility choice pending.   1330: [facility] beds, [facility] not.  Pt informed.   1430: Pt accepts bed offer at [facility].  CSW confirmed with [facility], who can receive pt tomorrow.    Auth updated in [place] with this facility choice.   Expected Discharge Plan: [place] Barriers to Discharge: Continued Medical Work up, SNF Pending bed offer  Expected Discharge Plan and Services In-house Referral: Clinical Social Work   Post Acute Care Choice: [place] Living arrangements for the past 2 months: Single Family Home                                       Social Determinants of Health (SDOH) Interventions SDOH Screenings   Food Insecurity: No Food Insecurity (03/29/2022)  Housing: Low Risk  (12/15/2021)  Transportation Needs: No Transportation Needs (03/29/2022)  Alcohol Screen: Low Risk  (12/15/2021)  Depression (PHQ2-9): Low Risk  (03/29/2022)  Financial Resource Strain: Low Risk  (12/15/2021)  Physical Activity: Inactive (12/15/2021)  Social Connections: Socially Isolated (12/15/2021)  Stress: No Stress Concern Present (12/15/2021)  Tobacco Use: Low Risk  (10/01/2022)    Readmission Risk Interventions     No data to display

## 2022-10-02 NOTE — Progress Notes (Signed)
Physical Therapy Treatment Patient Details Name: Gregory May MRN: [id number] DOB: 02-Nov-1960 Today's Date: 10/02/2022   History of Present Illness Gregory May came in with severe LLE cellulitis with planned BKA by Dr. Sharol Given.  has a past medical history of Arthritis, Deep vein thrombosis (DVT) ([place]), Diabetes mellitus, Diabetic ulcer of heel ([place]), DJD (degenerative joint disease), Ear drum perforation, right (03/06/2019), Non-pressure chronic ulcer of right calf, limited to breakdown of skin, Pulmonary emboli (HCC)    PT Comments    Continuing work on functional mobility and activity tolerance;  session focused on therex for stretching hip and knee flexors and strengthening hip and knee extensors in prep for prostheses; Pt participated quite well;   Pt's brother present for a portion of the session, and he verified that pt will have 24 hour supervision, and assist when his brother is home; Pt's aunt will be there when brother is at work; Pt will need to get to mod I at wc-transfer level, and an AIR stay will help him reach that level;   Pt is aware that dc will be soon, and that if AIR isn't an option, he has chosen a SNF for rehab  Recommendations for follow up therapy are one component of a multi-disciplinary discharge planning process, led by the attending physician.  Recommendations may be updated based on patient status, additional functional criteria and insurance authorization.  Follow Up Recommendations  Acute inpatient rehab (3hours/day) Can patient physically be transported by private vehicle: No   Assistance Recommended at Discharge Intermittent Supervision/Assistance  Patient can return home with the following A little help with walking and/or transfers;Assistance with cooking/housework;Help with stairs or ramp for entrance   Equipment Recommendations  Rolling walker (2 wheels);Wheelchair (measurements PT);Wheelchair cushion (measurements PT) (drop-arm BSC)     Recommendations for Other Services Rehab consult     Precautions / Restrictions Precautions Precautions: Other (comment) Precaution Comments: R residual limb with small wound; questionable prosthesis fit; Don't recommend to use R prosthesis for standing, as apivot point with lateral scooting should be fine Required Braces or Orthoses: Other Brace Other Brace: R prosthesis in room; porsthesis liner and socks are significantly worn/threadbare; Needs prosthetist consult Restrictions Weight Bearing Restrictions: Yes LLE Weight Bearing: Non weight bearing Other Position/Activity Restrictions: RLE with no ordered weight bearing restrictions, but with small wound residual limb, do not recommend weight bearing at this time     Mobility  Bed Mobility Overal bed mobility: Needs Assistance   Rolling: Min guard         General bed mobility comments: Rolled into L sidelying in recliner (laid back to near supine position    Transfers Overall transfer level: Needs assistance   Transfers: Bed to chair/wheelchair/BSC         Anterior-Posterior transfers: Min guard (simulated in recliner)   General transfer comment: Able to weight shift and use bil LEs to push up and scoot hips forward and backward in recliner    Ambulation/Gait                   Stairs             Wheelchair Mobility    Modified Rankin (Stroke Patients Only)       Balance     Sitting balance-Leahy Scale: Fair  Cognition Arousal/Alertness: Awake/alert Behavior During Therapy: WFL for tasks assessed/performed, Impulsive Overall Cognitive Status: Impaired/Different from baseline                       Memory: Decreased short-term memory         General Comments: Pt much more alert today and engaged in safer d/c planning.        Exercises Other Exercises Other Exercises: Bolstered bridging x 10 Other Exercises: Chair  push ups x10 Other Exercises: Sidelying L hip abduction against gravity x 10 Other Exercises: R sidelying L hip extension x2    General Comments General comments (skin integrity, edema, etc.): More family in room, and this PT got a better feel for family assist      Pertinent Vitals/Pain Pain Assessment Pain Assessment: 0-10 Faces Pain Scale: Hurts even more Pain Location: L residual limb Pain Descriptors / Indicators: Grimacing, Guarding Pain Intervention(s): Limited activity within patient's tolerance    Home Living                          Prior Function            PT Goals (current goals can now be found in the care plan section) Acute Rehab PT Goals Patient Stated Goal: less pain LLE PT Goal Formulation: With patient Time For Goal Achievement: 10/14/22 Potential to Achieve Goals: Good Progress towards PT goals: Progressing toward goals    Frequency    Min 3X/week      PT Plan Discharge plan needs to be updated;Frequency needs to be updated (More information re: family assist, better participation)    Co-evaluation              AM-PAC PT "6 Clicks" Mobility   Outcome Measure  Help needed turning from your back to your side while in a flat bed without using bedrails?: A Little Help needed moving from lying on your back to sitting on the side of a flat bed without using bedrails?: A Little Help needed moving to and from a bed to a chair (including a wheelchair)?: A Little Help needed standing up from a chair using your arms (e.g., wheelchair or bedside chair)?: Total Help needed to walk in hospital room?: Total Help needed climbing 3-5 steps with a railing? : Total 6 Click Score: 12    End of Session   Activity Tolerance: Patient tolerated treatment well Patient left: in chair;with call bell/phone within reach (about to eat lunch) Nurse Communication: Mobility status PT Visit Diagnosis: Other abnormalities of gait and mobility  (R26.89);Muscle weakness (generalized) (M62.81);Pain Pain - Right/Left: Left Pain - part of body: Leg     Time: 1255-1330 PT Time Calculation (min) (ACUTE ONLY): 35 min  Charges:  $Therapeutic Exercise: 8-22 mins $Therapeutic Activity: 8-22 mins                     Roney Marion, PT  Acute Rehabilitation Services Office [phone]    [name] 10/02/2022, 3:22 PM

## 2022-10-02 NOTE — Care Management Important Message (Signed)
Important Message  Patient Details  Name: Gregory May MRN: [id number] Date of Birth: [birth year]   Medicare Important Message Given:  Yes     Hannah Beat 10/02/2022, 11:36 AM

## 2022-10-03 ENCOUNTER — Inpatient Hospital Stay (HOSPITAL_COMMUNITY): Admission: RE | Admit: 2022-10-03 | Payer: Medicare HMO | Source: Home / Self Care | Admitting: Orthopedic Surgery

## 2022-10-03 DIAGNOSIS — R2681 Unsteadiness on feet: Secondary | ICD-10-CM | POA: Diagnosis not present

## 2022-10-03 DIAGNOSIS — Z7401 Bed confinement status: Secondary | ICD-10-CM | POA: Diagnosis not present

## 2022-10-03 DIAGNOSIS — Z86718 Personal history of other venous thrombosis and embolism: Secondary | ICD-10-CM | POA: Diagnosis not present

## 2022-10-03 DIAGNOSIS — L03119 Cellulitis of unspecified part of limb: Secondary | ICD-10-CM | POA: Diagnosis not present

## 2022-10-03 DIAGNOSIS — I82401 Acute embolism and thrombosis of unspecified deep veins of right lower extremity: Secondary | ICD-10-CM | POA: Diagnosis not present

## 2022-10-03 DIAGNOSIS — M86172 Other acute osteomyelitis, left ankle and foot: Secondary | ICD-10-CM | POA: Diagnosis not present

## 2022-10-03 DIAGNOSIS — I82409 Acute embolism and thrombosis of unspecified deep veins of unspecified lower extremity: Secondary | ICD-10-CM | POA: Diagnosis present

## 2022-10-03 DIAGNOSIS — Z4781 Encounter for orthopedic aftercare following surgical amputation: Secondary | ICD-10-CM | POA: Diagnosis not present

## 2022-10-03 DIAGNOSIS — E785 Hyperlipidemia, unspecified: Secondary | ICD-10-CM | POA: Diagnosis not present

## 2022-10-03 DIAGNOSIS — G459 Transient cerebral ischemic attack, unspecified: Secondary | ICD-10-CM | POA: Diagnosis not present

## 2022-10-03 DIAGNOSIS — H3411 Central retinal artery occlusion, right eye: Secondary | ICD-10-CM | POA: Diagnosis not present

## 2022-10-03 DIAGNOSIS — Z89511 Acquired absence of right leg below knee: Secondary | ICD-10-CM | POA: Diagnosis not present

## 2022-10-03 DIAGNOSIS — I10 Essential (primary) hypertension: Secondary | ICD-10-CM | POA: Diagnosis not present

## 2022-10-03 DIAGNOSIS — R739 Hyperglycemia, unspecified: Secondary | ICD-10-CM | POA: Diagnosis not present

## 2022-10-03 DIAGNOSIS — L03116 Cellulitis of left lower limb: Secondary | ICD-10-CM | POA: Diagnosis not present

## 2022-10-03 DIAGNOSIS — E1142 Type 2 diabetes mellitus with diabetic polyneuropathy: Secondary | ICD-10-CM | POA: Diagnosis not present

## 2022-10-03 DIAGNOSIS — R41841 Cognitive communication deficit: Secondary | ICD-10-CM | POA: Diagnosis not present

## 2022-10-03 DIAGNOSIS — M6281 Muscle weakness (generalized): Secondary | ICD-10-CM | POA: Diagnosis not present

## 2022-10-03 DIAGNOSIS — E1165 Type 2 diabetes mellitus with hyperglycemia: Secondary | ICD-10-CM | POA: Diagnosis not present

## 2022-10-03 DIAGNOSIS — Z79899 Other long term (current) drug therapy: Secondary | ICD-10-CM | POA: Diagnosis not present

## 2022-10-03 DIAGNOSIS — M79652 Pain in left thigh: Secondary | ICD-10-CM | POA: Diagnosis not present

## 2022-10-03 DIAGNOSIS — E11621 Type 2 diabetes mellitus with foot ulcer: Secondary | ICD-10-CM | POA: Diagnosis not present

## 2022-10-03 DIAGNOSIS — Z89512 Acquired absence of left leg below knee: Secondary | ICD-10-CM | POA: Diagnosis not present

## 2022-10-03 DIAGNOSIS — R2689 Other abnormalities of gait and mobility: Secondary | ICD-10-CM | POA: Diagnosis not present

## 2022-10-03 LAB — CULTURE, BLOOD (ROUTINE X 2)
Culture: NO GROWTH
Culture: NO GROWTH
Special Requests: ADEQUATE
Special Requests: ADEQUATE

## 2022-10-03 LAB — CBC
HCT: 39.1 % (ref 39.0–52.0)
Hemoglobin: 13.2 g/dL (ref 13.0–17.0)
MCH: 31 pg (ref 26.0–34.0)
MCHC: 33.8 g/dL (ref 30.0–36.0)
MCV: 91.8 fL (ref 80.0–100.0)
Platelets: 413 10*3/uL — ABNORMAL HIGH (ref 150–400)
RBC: 4.26 MIL/uL (ref 4.22–5.81)
RDW: 14.2 % (ref 11.5–15.5)
WBC: 12.8 10*3/uL — ABNORMAL HIGH (ref 4.0–10.5)
nRBC: 0.2 % (ref 0.0–0.2)

## 2022-10-03 LAB — PROTIME-INR
INR: 1.5 — ABNORMAL HIGH (ref 0.8–1.2)
Prothrombin Time: 18.1 seconds — ABNORMAL HIGH (ref 11.4–15.2)

## 2022-10-03 LAB — SURGICAL PATHOLOGY

## 2022-10-03 LAB — GLUCOSE, CAPILLARY
Glucose-Capillary: 154 mg/dL — ABNORMAL HIGH (ref 70–99)
Glucose-Capillary: 175 mg/dL — ABNORMAL HIGH (ref 70–99)

## 2022-10-03 SURGERY — AMPUTATION BELOW KNEE
Anesthesia: Choice | Site: Knee | Laterality: Left

## 2022-10-03 MED ORDER — INSULIN ASPART 100 UNIT/ML IJ SOLN: 0.0000 [IU] | Freq: Three times a day (TID) | INTRAMUSCULAR | 11 refills | Status: DC

## 2022-10-03 MED ORDER — GUAIFENESIN-DM 100-10 MG/5ML PO SYRP: 15.0000 mL | ORAL_SOLUTION | ORAL | 0 refills | Status: DC | PRN

## 2022-10-03 MED ORDER — PANTOPRAZOLE SODIUM 40 MG PO TBEC: 40.0000 mg | DELAYED_RELEASE_TABLET | Freq: Every day | ORAL | Status: DC

## 2022-10-03 MED ORDER — ALUM & MAG HYDROXIDE-SIMETH 200-200-20 MG/5ML PO SUSP: 15.0000 mL | ORAL | 0 refills | Status: DC | PRN

## 2022-10-03 MED ORDER — MAGNESIUM CITRATE PO SOLN: 1.0000 | Freq: Once | ORAL | Status: DC | PRN

## 2022-10-03 MED ORDER — ASCORBIC ACID 1000 MG PO TABS: 1000.0000 mg | ORAL_TABLET | Freq: Every day | ORAL | Status: DC

## 2022-10-03 MED ORDER — INSULIN ASPART 100 UNIT/ML IJ SOLN: 16.0000 [IU] | Freq: Every evening | INTRAMUSCULAR | Status: DC

## 2022-10-03 MED ORDER — SENNOSIDES-DOCUSATE SODIUM 8.6-50 MG PO TABS: 1.0000 | ORAL_TABLET | Freq: Every evening | ORAL | Status: DC | PRN

## 2022-10-03 MED ORDER — PHENOL 1.4 % MT LIQD: 1.0000 | OROMUCOSAL | 0 refills | Status: DC | PRN

## 2022-10-03 MED ORDER — MUPIROCIN 2 % EX OINT: 1.0000 | TOPICAL_OINTMENT | Freq: Two times a day (BID) | CUTANEOUS | 0 refills | Status: DC

## 2022-10-03 MED ORDER — OXYCODONE HCL 10 MG PO TABS: 15.0000 mg | ORAL_TABLET | ORAL | 0 refills | Status: AC | PRN

## 2022-10-03 MED ORDER — ZINC SULFATE 220 (50 ZN) MG PO CAPS: 220.0000 mg | ORAL_CAPSULE | Freq: Every day | ORAL | Status: AC

## 2022-10-03 MED ORDER — WARFARIN SODIUM 5 MG PO TABS
10.0000 mg | ORAL_TABLET | Freq: Once | ORAL | Status: AC
  Administered 2022-10-03: 10 mg via ORAL
  Filled 2022-10-03: qty 2

## 2022-10-03 MED ORDER — BISACODYL 5 MG PO TBEC: 5.0000 mg | DELAYED_RELEASE_TABLET | Freq: Every day | ORAL | 0 refills | Status: DC | PRN

## 2022-10-03 MED ORDER — POLYETHYLENE GLYCOL 3350 17 G PO PACK: 17.0000 g | PACK | Freq: Every day | ORAL | 0 refills | Status: DC | PRN

## 2022-10-03 NOTE — Progress Notes (Signed)
Patient ID: Gregory May, male   DOB: 20-Aug-1960, 62 y.o.   MRN: [id number] Patient without complaints at this time.  No drainage in the wound VAC canister.  Patient planning for discharge to skilled nursing facility.  Discharged with the Praveena plus portable wound VAC pump.

## 2022-10-03 NOTE — Discharge Summary (Addendum)
Name: Gregory May MRN: [id number] DOB: 01-19-1961 62 y.o. PCP: Idamae Schuller, MD  Date of Admission: 09/28/2022  9:29 PM Date of Discharge: 10/03/2022 Attending Physician: Dr. Jimmye Norman  Discharge Diagnosis: Principal Problem:   Acute osteomyelitis of left foot [facility]) Active Problems:   Poorly controlled type 2 diabetes mellitus with complication ([place])   Status post below knee amputation of right lower extremity ([place])   Left below-knee amputee ([place])   Cutaneous abscess of left foot   Lower extremity cellulitis   Recurrent acute deep vein thrombosis (DVT) of lower extremity ([place])    Discharge Medications: Allergies as of 10/03/2022   No Known Allergies      Medication List     STOP taking these medications    amoxicillin-clavulanate 500-125 MG tablet Commonly known as: Augmentin   doxycycline 100 MG tablet Commonly known as: VIBRA-TABS   HYDROcodone-acetaminophen 5-325 MG tablet Commonly known as: NORCO/VICODIN   [place] FlexTouch 100 UNIT/ML FlexTouch Pen Generic drug: insulin degludec       TAKE these medications    Accu-Chek FastClix Lancets Misc Use Accu Chek Fastclix lancets to check blood sugar three times daily. DX:E11.65   Accu-Chek Guide test strip Generic drug: glucose blood TEST BLOOD SUGAR THREE TIMES DAILY AS DIRECTED   acetaminophen 500 MG tablet Commonly known as: TYLENOL Take 1,000 mg by mouth as needed for mild pain or moderate pain.   alum & mag hydroxide-simeth 200-200-20 MG/5ML suspension Commonly known as: MAALOX/MYLANTA Take 15-30 mLs by mouth every 2 (two) hours as needed for indigestion.   APPLE CIDER VINEGAR PO Take 1 tablet by mouth daily.   ascorbic acid 1000 MG tablet Commonly known as: VITAMIN C Take 1 tablet (1,000 mg total) by mouth daily.   bisacodyl 5 MG EC tablet Commonly known as: DULCOLAX Take 1 tablet (5 mg total) by mouth daily as needed for moderate constipation.   empagliflozin 25 MG Tabs  tablet Commonly known as: Jardiance Take 1 tablet (25 mg total) by mouth daily.   fluticasone 50 MCG/ACT nasal spray Commonly known as: FLONASE USE 2 SPRAY(S) IN EACH NOSTRIL AT BEDTIME AS NEEDED FOR  SINUS  DRAINAGE What changed: See the new instructions.   gabapentin 300 MG capsule Commonly known as: NEURONTIN TAKE 1 CAPSULE FOUR TIMES DAILY AS NEEDED What changed: See the new instructions.   guaiFENesin-dextromethorphan 100-10 MG/5ML syrup Commonly known as: ROBITUSSIN DM Take 15 mLs by mouth every 4 (four) hours as needed for cough.   insulin aspart 100 UNIT/ML injection Commonly known as: novoLOG Inject 0-9 Units into the skin 3 (three) times daily with meals. Correction coverage: Sensitive (thin, NPO, renal)  CBG < 70: Implement Hypoglycemia Standing Orders and refer to Hypoglycemia Standing Orders sidebar report  CBG 70 - 120: 0 units  CBG 121 - 150: 1 unit  CBG 151 - 200: 2 units  CBG 201 - 250: 3 units  CBG 251 - 300: 5 units  CBG 301 - 350: 7 units  CBG 351 - 400 9 units  CBG > 400  10 units What changed:  how much to take how to take this when to take this additional instructions   Insulin Syringes (Disposable) U-100 0.5 ML Misc Use to inject insulin   magnesium citrate Soln Take 296 mLs (1 Bottle total) by mouth once as needed for severe constipation.   metFORMIN 750 MG 24 hr tablet Commonly known as: GLUCOPHAGE-XR TAKE 2 TABLETS EVERY DAY   multivitamin with minerals Tabs tablet  Take 1 tablet by mouth every morning. Centrum   mupirocin ointment 2 % Commonly known as: BACTROBAN Place 1 Application into the nose 2 (two) times daily.   Oxycodone HCl 10 MG Tabs Take 1.5 tablets (15 mg total) by mouth every 4 (four) hours as needed for up to 7 days for severe pain. May decrease to 10 mg every 4 hours as needed.   pantoprazole 40 MG tablet Commonly known as: PROTONIX Take 1 tablet (40 mg total) by mouth daily.   phenol 1.4 % Liqd Commonly known as:  CHLORASEPTIC Use as directed 1 spray in the mouth or throat as needed for throat irritation / pain.   pioglitazone 30 MG tablet Commonly known as: ACTOS TAKE 1 TABLET EVERY DAY   polyethylene glycol 17 g packet Commonly known as: MIRALAX / GLYCOLAX Take 17 g by mouth daily as needed for mild constipation.   prednisoLONE acetate 1 % ophthalmic suspension Commonly known as: PRED FORTE Place 1 drop into the right eye every evening.   rosuvastatin 20 MG tablet Commonly known as: CRESTOR Take 1 tablet (20 mg total) by mouth daily.   senna-docusate 8.6-50 MG tablet Commonly known as: Senokot-S Take 1 tablet by mouth at bedtime as needed for mild constipation.   warfarin 5 MG tablet Commonly known as: COUMADIN Take as directed. If you are unsure how to take this medication, talk to your nurse or doctor. Original instructions: Take 2 (two) of your 5 mg peach-colored warfarin tablets on Mondays and Thursdays. All OTHER DAYS, (Sundays, Tuesdays, Wednesdays, Fridays and Saturdays) take two and one-half ( 2 & 1/2 tablets.) What changed:  how much to take how to take this when to take this additional instructions   zinc sulfate 220 (50 Zn) MG capsule Take 1 capsule (220 mg total) by mouth daily.               Discharge Care Instructions  (From admission, onward)           Start     Ordered   10/03/22 0000  Discharge wound care:       Comments: Maintain wound vac on left BKA and patient will follow up with orthopedics in 1 week in clinic.   10/03/22 1302            Disposition and follow-up:   Mr.Breandan T Bovenzi was discharged from [facility] in Stable condition.  At the hospital follow up visit please address:  1.  Follow-up:  a. Ensure he is able to follow up with Dr. Jess Barters office for wound vac changes and continued post op monitoring. Maintain wound vac without change at [facility] until follow up with Dr. Jess Barters office then instructions  per Dr. Sharol Given.    b. Continue to monitor INR with checks at least every 2 days. We have resumed his home warfarin dose with details above in medication list.  2.  Labs / imaging needed at time of follow-up: None  3.  Pending labs/ test needing follow-up: None   Follow-up Appointments:  Contact information for follow-up providers     [name], MD Follow up in 1 week(s).   Specialty: Orthopedic Surgery Contact information: [place] [address] [phone]         Idamae Schuller, MD. Schedule an appointment as soon as possible for a visit.   Specialty: Internal Medicine Why: within a week after being discharged from rehab Contact information: [place]  [place] [address] [phone]              Contact information for after-discharge care     Destination     [facility] Preferred SNF .   Service: Skilled Nursing Contact information: [address] [place] Hospital Course by problem list: QADREE FLOREY is a 62 y.o. 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 Sharol Given for rapidly progressing left lower extremity cellulitis that failed oral antibiotics from an infected neuropathic ulcer on his heel. He was planning for left BKA on 10/03/2022 but due to his progression he was admitted for IV antibiotics and urgent operation. He underwent left BKA on 09/29/2022 and had an uncomplicated post op course with pain being the largest concern. PT/OT evaluated and  recommended SNF or CIR. Unfortunately his insurance did not approve CIR and he was offered a bed at [facility] for rehab. He was discharged with oxycodone 15 mg every 4 hours as needed for pain along with scheduled tylenol, gabapentin, and a bowel regimen. He will follow up with orthopedics 1 week post op for wound vac change.    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 He had been recommended to use Tresiba 6 units daily as well as to get a freestyle [place] outpatient. He was continued on home pioglitazone 30 mg daily, Jardiance 25 mg daily, and metformin 750 mg twice daily. Blood sugars we well controlled during admission. We will continue to work with him on his regimen when he returns to our clinic outpatient.   History of central retinal artery occlusion Previous DVTs Chronic warfarin therapy INR was elevated above goal of 2-3 on admission. Underwent surgery as above the following morning. Warfarin was dosed by pharmacy based on INR. INR 1.5 on day of discharge. Recommend INR checks at least every 2 days and he will resume his home dosing.   Hyperlipidemia Continued Crestor 20 daily.   Discharge Subjective: Left leg pain is improved today although still there. He thinks the oral medications are working well and he is excited for rehab. He is upset that he is unable to go to CIR here but is still determined to have a positive outlook on his recovery which we strongly encourage and applaud.  Discharge Exam:   BP 124/73 (BP Location: Right Arm)   Pulse 75   Temp 97.6 F (36.4 C) (Oral)   Resp 17   SpO2 98%  Constitutional: Elderly male laying in bed, in no acute distress Pulmonary/Chest: normal work of breathing on room air MSK: Stable right BKA.  S/p left BKA with bandage sock and splint overlying, no tenderness, erythema, or edema of the knee or proximal Neurological: alert & oriented x 3, No focal deficits  noted Skin: warm and dry, 2-3 cm firm, mobile, and non-tender sebaceous cyst on right upper back  Pertinent Labs, Studies, and Procedures:     Latest Ref  Rng & Units 10/03/2022    2:13 AM 10/01/2022    5:02 AM 09/30/2022    4:06 AM  CBC  WBC 4.0 - 10.5 K/uL 12.8  12.6  14.0   Hemoglobin 13.0 - 17.0 g/dL 13.2  12.8  11.8   Hematocrit 39.0 - 52.0 % 39.1  39.4  36.3   Platelets 150 - 400 K/uL 413  369  325        Latest Ref Rng & Units 09/30/2022    4:06 AM 09/29/2022    5:52 AM 09/28/2022   10:26 PM  CMP  Glucose 70 - 99 mg/dL 119  109  179   BUN 6 - 20 mg/dL 20  19  24   $ Creatinine 0.61 - 1.24 mg/dL 0.69  0.84  0.92   Sodium 135 - 145 mmol/L 140  134  135   Potassium 3.5 - 5.1 mmol/L 3.9  3.5  3.8   Chloride 98 - 111 mmol/L 104  99  99   CO2 22 - 32 mmol/L 26  25  24   $ Calcium 8.9 - 10.3 mg/dL 8.6  8.2  8.6   Total Protein 6.5 - 8.1 g/dL   7.0   Total Bilirubin 0.3 - 1.2 mg/dL   0.3   Alkaline Phos 38 - 126 U/L   78   AST 15 - 41 U/L   40   ALT 0 - 44 U/L   36     DG Foot Complete Left  Result Date: 09/28/2022 CLINICAL DATA:  Concern for osteomyelitis. EXAM: LEFT FOOT - COMPLETE 3+ VIEW COMPARISON:  Left foot radiograph dated 04/18/2021. FINDINGS: Amputation of the second phalanges and midportion of the third metatarsal. There is a minimally displaced fracture of the base of the distal phalanx of the fourth digit, new since the prior radiograph. Acute osteomyelitis is not excluded. MRI or a white blood cell nuclear scan may provide better evaluation. The bones are osteopenic. There is hallux valgus. The soft tissues are unremarkable. Vascular calcifications noted. IMPRESSION: Minimally displaced fracture of the base of the distal phalanx of the fourth digit. Acute osteomyelitis is not excluded. Electronically Signed   By: Anner Crete M.D.   On: 09/28/2022 22:40     Discharge Instructions: Discharge Instructions     Call MD for:  difficulty breathing, headache or visual  disturbances   Complete by: As directed    Call MD for:  extreme fatigue   Complete by: As directed    Call MD for:  persistant dizziness or light-headedness   Complete by: As directed    Call MD for:  persistant nausea and vomiting   Complete by: As directed    Call MD for:  redness, tenderness, or signs of infection (pain, swelling, redness, odor or green/yellow discharge around incision site)   Complete by: As directed    Call MD for:  severe uncontrolled pain   Complete by: As directed    Call MD for:  temperature >100.4   Complete by: As directed    Diet - low sodium heart healthy   Complete by: As directed    Discharge wound care:   Complete by: As directed    Maintain wound vac on left BKA and patient will follow up with orthopedics in 1 week in clinic.   Increase activity slowly   Complete by: As directed    Negative Pressure Wound Therapy - Incisional   Complete by: As directed  Brandom T Casella,  You were recently admitted to [facility] for infection in your left heel that required a left below the knee amputation.   Continue taking your home medications with the following changes  Start taking Oxycodone 15 mg every 4 hours as needed for severe pain, we will send you with a 7-day course   You should seek further medical care if you have any signs of infection including fevers, chills, severe fatigue, drainage from your surgical wound, redness around your surgical wound, swelling around the surgical wound, or increased pain of any surgical wound or up your leg.  We recommend that you see your primary care doctor in about a week to make sure that you continue to improve. We are so glad that you are feeling better.  Sincerely, Johny Blamer, DO  Signed: Johny Blamer, DO 10/03/2022, 1:02 PM   Pager: [phone]

## 2022-10-03 NOTE — Progress Notes (Signed)
ANTICOAGULATION CONSULT NOTE - Follow Up Consult  Pharmacy Consult for Warfarin Indication: DVT, retinal artery occlusion  No Known Allergies  Vital Signs: Temp: 97.6 F (36.4 C) (02/21 0725) Temp Source: Oral (02/21 0725) BP: 124/73 (02/21 0725) Pulse Rate: 75 (02/21 0725)  Labs: Recent Labs    10/01/22 0502 10/02/22 0210 10/03/22 0213  HGB 12.8*  --  13.2  HCT 39.4  --  39.1  PLT 369  --  413*  LABPROT 22.8* 17.3* 18.1*  INR 2.0* 1.4* 1.5*    CrCl cannot be calculated (Unknown ideal weight.).  Assessment: 62 year old s/p BKA this admission to resume warfarin on 2/17 for DVT/retinal artery occlusion, dose PTA - 10 mg MWF, 12.5 mg TThSS. Last dose PTA 2/16.   INR subtherapeutic at 1.5 (likely due to reduced warfarin doses 2/2 supratherapeutic INR) but slowly increasing. CBC stable. Has been back on PTA warfarin regimen for ~3 days now, so anticipate INR to normalize over next couple of days.   Goal of Therapy:  INR 2-3 Monitor platelets by anticoagulation protocol: Yes   Plan:  Warfarin 10 mg PO x1 this evening (home dose) Daily INR while inpatient  Would continue home warfarin regimen at discharge (warfarin 8m MWF and warfarin 12.557mTTSS) as patient was previously therapeutic on this regimen.   MaDimple NanasPharmD, BCPS 10/03/2022 8:51 AM

## 2022-10-03 NOTE — TOC Progression Note (Addendum)
Transition of Care [facility]) - Progression Note    Patient Details  Name: Gregory May MRN: [id number] Date of Birth: 1960-09-16  Transition of [facility]) CM/SW Contact  Joanne Chars, LCSW Phone Number: 10/03/2022, 9:55 AM  Clinical Narrative:   Pt again reviewed by CIR at pt and family's request, CIR again saying pt not a candidate.  SNF Auth still pending in [place] requesting updated PT note, which was uploaded.   1115: Auth approved: [id number], [id number], 3 days: 2/21-2/23.  MD informed.    Expected Discharge Plan: [place] Barriers to Discharge: Continued Medical Work up, SNF Pending bed offer  Expected Discharge Plan and Services In-house Referral: Clinical Social Work   Post Acute Care Choice: [place] Living arrangements for the past 2 months: Single Family Home                                       Social Determinants of Health (SDOH) Interventions SDOH Screenings   Food Insecurity: No Food Insecurity (03/29/2022)  Housing: Low Risk  (12/15/2021)  Transportation Needs: No Transportation Needs (03/29/2022)  Alcohol Screen: Low Risk  (12/15/2021)  Depression (PHQ2-9): Low Risk  (03/29/2022)  Financial Resource Strain: Low Risk  (12/15/2021)  Physical Activity: Inactive (12/15/2021)  Social Connections: Socially Isolated (12/15/2021)  Stress: No Stress Concern Present (12/15/2021)  Tobacco Use: Low Risk  (10/01/2022)    Readmission Risk Interventions     No data to display

## 2022-10-03 NOTE — Progress Notes (Signed)
Report called to [facility], questions asked and answered. IV removal well tolerated, picked up by [facility] with script

## 2022-10-03 NOTE — TOC Transition Note (Signed)
Transition of Care [facility]) - CM/SW Discharge Note   Patient Details  Name: Gregory May MRN: [id number] Date of Birth: 1961/06/01  Transition of Care [facility]) CM/SW Contact:  Joanne Chars, LCSW Phone Number: 10/03/2022, 1:46 PM   Clinical Narrative:   Pt discharging to [facility].  RN call report to [phone].    Final next level of care: Skilled Nursing Facility Barriers to Discharge: Barriers Resolved   Patient Goals and CMS Choice CMS Medicare.gov Compare Post Acute Care list provided to:: Patient Choice offered to / list presented to : Patient  Discharge Placement                Patient chooses bed at: [facility] Patient to be transferred to facility by: [place] Name of family member notified: Left message with aunt, unable to reach brother Ronalee Belts Patient and family notified of of transfer: 10/03/22  Discharge Plan and Services Additional resources added to the After Visit Summary for   In-house Referral: Clinical Social Work   Post Acute Care Choice: [facility]                               Social Determinants of Health (SDOH) Interventions SDOH Screenings   Food Insecurity: No Food Insecurity (03/29/2022)  Housing: Low Risk  (12/15/2021)  Transportation Needs: No Transportation Needs (03/29/2022)  Alcohol Screen: Low Risk  (12/15/2021)  Depression (PHQ2-9): Low Risk  (03/29/2022)  Financial Resource Strain: Low Risk  (12/15/2021)  Physical Activity: Inactive (12/15/2021)  Social Connections: Socially Isolated (12/15/2021)  Stress: No Stress Concern Present (12/15/2021)  Tobacco Use: Low Risk  (10/01/2022)     Readmission Risk Interventions     No data to display

## 2022-10-04 ENCOUNTER — Encounter: Payer: Medicare HMO | Admitting: Internal Medicine

## 2022-10-04 DIAGNOSIS — R2689 Other abnormalities of gait and mobility: Secondary | ICD-10-CM | POA: Diagnosis not present

## 2022-10-04 DIAGNOSIS — M6281 Muscle weakness (generalized): Secondary | ICD-10-CM | POA: Diagnosis not present

## 2022-10-04 DIAGNOSIS — Z89512 Acquired absence of left leg below knee: Secondary | ICD-10-CM | POA: Diagnosis not present

## 2022-10-04 DIAGNOSIS — R2681 Unsteadiness on feet: Secondary | ICD-10-CM | POA: Diagnosis not present

## 2022-10-05 ENCOUNTER — Telehealth: Payer: Self-pay | Admitting: Orthopedic Surgery

## 2022-10-05 DIAGNOSIS — E11621 Type 2 diabetes mellitus with foot ulcer: Secondary | ICD-10-CM | POA: Diagnosis not present

## 2022-10-05 DIAGNOSIS — Z86718 Personal history of other venous thrombosis and embolism: Secondary | ICD-10-CM | POA: Diagnosis not present

## 2022-10-05 DIAGNOSIS — H3411 Central retinal artery occlusion, right eye: Secondary | ICD-10-CM | POA: Diagnosis not present

## 2022-10-05 DIAGNOSIS — Z89511 Acquired absence of right leg below knee: Secondary | ICD-10-CM | POA: Diagnosis not present

## 2022-10-05 NOTE — Telephone Encounter (Signed)
I called and se pt and he is worried about only having 20 days in the SNF and wants to make sure that he has an appt to come in and be seen. Advised I would call the [facility] and make an appt. Called and he is sch for Wednesday with Junie Panning

## 2022-10-05 NOTE — Telephone Encounter (Signed)
Please call patient ASAP he needs to speak to someone about his healing process

## 2022-10-08 ENCOUNTER — Other Ambulatory Visit: Payer: Self-pay | Admitting: Endocrinology

## 2022-10-08 DIAGNOSIS — M6281 Muscle weakness (generalized): Secondary | ICD-10-CM | POA: Diagnosis not present

## 2022-10-08 DIAGNOSIS — I82401 Acute embolism and thrombosis of unspecified deep veins of right lower extremity: Secondary | ICD-10-CM | POA: Diagnosis not present

## 2022-10-08 DIAGNOSIS — E1165 Type 2 diabetes mellitus with hyperglycemia: Secondary | ICD-10-CM | POA: Diagnosis not present

## 2022-10-08 DIAGNOSIS — R2689 Other abnormalities of gait and mobility: Secondary | ICD-10-CM | POA: Diagnosis not present

## 2022-10-08 DIAGNOSIS — R2681 Unsteadiness on feet: Secondary | ICD-10-CM | POA: Diagnosis not present

## 2022-10-08 DIAGNOSIS — L03116 Cellulitis of left lower limb: Secondary | ICD-10-CM | POA: Diagnosis not present

## 2022-10-08 DIAGNOSIS — Z89512 Acquired absence of left leg below knee: Secondary | ICD-10-CM | POA: Diagnosis not present

## 2022-10-09 ENCOUNTER — Encounter: Payer: Medicare HMO | Admitting: Internal Medicine

## 2022-10-09 DIAGNOSIS — Z89511 Acquired absence of right leg below knee: Secondary | ICD-10-CM | POA: Diagnosis not present

## 2022-10-09 DIAGNOSIS — E11621 Type 2 diabetes mellitus with foot ulcer: Secondary | ICD-10-CM | POA: Diagnosis not present

## 2022-10-09 DIAGNOSIS — H3411 Central retinal artery occlusion, right eye: Secondary | ICD-10-CM | POA: Diagnosis not present

## 2022-10-09 DIAGNOSIS — Z86718 Personal history of other venous thrombosis and embolism: Secondary | ICD-10-CM | POA: Diagnosis not present

## 2022-10-10 ENCOUNTER — Ambulatory Visit (INDEPENDENT_AMBULATORY_CARE_PROVIDER_SITE_OTHER): Payer: Medicare HMO | Admitting: Family

## 2022-10-10 ENCOUNTER — Encounter: Payer: Self-pay | Admitting: Family

## 2022-10-10 DIAGNOSIS — M79652 Pain in left thigh: Secondary | ICD-10-CM

## 2022-10-10 NOTE — Progress Notes (Signed)
Post-Op Visit Note   Patient: Gregory May           Date of Birth: Apr 02, 1961           MRN: [id number] Visit Date: 10/10/2022 PCP: Idamae Schuller, MD  Chief Complaint: No chief complaint on file.   HPI:  HPI The patient is a 62 year old gentleman who presents 1 week status post left below-knee amputation.  Wound VAC removed today.  Is 11 days post op. Is having left medial thigh pain and tenderness, on going for last 2 weeks or so per report. Ortho Exam On examination of the left residual limb sutures are in place.  There is no gaping drainage or erythema  Left medial thigh pain and tenderness without erythema, edema or warmth  Visit Diagnoses: No diagnosis found.  Plan: Begin daily Dial soap cleansing.  Dry dressings.  Shrinker around-the-clock follow-up in 2 weeks for suture removal.  Will schedule [place] rule out dvt for left LE.  Follow-Up Instructions: No follow-ups on file.   Imaging: No results found.  Orders:  No orders of the defined types were placed in this encounter.  No orders of the defined types were placed in this encounter.    PMFS History: Patient Active Problem List   Diagnosis Date Noted   Recurrent acute deep vein thrombosis (DVT) of lower extremity ([place]) 10/03/2022   Lower extremity cellulitis 09/29/2022   Acute osteomyelitis of left foot ([place]) 09/29/2022   PFO with atrial septal aneurysm    Long term (current) use of anticoagulants 02/26/2022   Right retinal embolus 01/15/2022   DM type 2 with diabetic peripheral neuropathy ([place]) 01/15/2022   Obstructive sleep apnea 01/15/2022   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   Transportation insecurity 11/23/2021   Retinal artery occlusion, central 11/15/2021   Epidermal inclusion cyst 10/18/2021   Amputated finger 09/05/2021   Osteomyelitis of finger of right hand ([place]) 08/19/2021   Hepatic steatosis 06/24/2020   Back pain 04/20/2020   Left knee pain 04/20/2020   Allergic sinusitis  04/20/2020   Elevated liver enzymes 04/20/2020   Cutaneous abscess of left foot    Diastasis of rectus abdominis 07/16/2019   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 10/07/2018   Essential hypertension 06/19/2018   Peripheral neuropathy 12/19/2017   Left below-knee amputee ([place]) 04/17/2017   Carpal tunnel syndrome 03/18/2017   Status post below knee amputation of right lower extremity ([place]) 06/07/2015   Diabetic polyneuropathy associated with type 2 diabetes mellitus ([place]) 12/09/2014   Diabetes mellitus with carpal tunnel syndrome ([place]) 12/09/2014   DJD (degenerative joint disease) of knee 07/21/2014   Osteoarthritis, hip, bilateral 05/25/2014   Onychomycosis 10/14/2013   Pure hypercholesterolemia 09/25/2013   Poorly controlled type 2 diabetes mellitus with complication ([place]) [id number]   Past Medical History:  Diagnosis Date   Arthritis    bilateral hips   Cutaneous abscess of left foot    Deep vein thrombosis (DVT) (HCC)    Diabetes mellitus    type II   Diabetic ulcer of heel (HCC)    Right heel   DJD (degenerative joint disease)    DVT (deep venous thrombosis) ([place]) 02/08/2014   Proximal provoked. Date of diagnosis February 08 2014 Duration of anticoagulation: 6 months. End date 08/12/2014.  Anticoagulant: Lovenox 120 units daily Switched to Eliquis on 05/25/2014      Ear drum perforation, right 03/06/2019   Non-pressure chronic ulcer of right calf, limited to breakdown of  skin ([place]) 04/17/2017   Pulmonary emboli ([place]) 02/08/2014   Date of diagnosis February 08 2014, on chest CTA Hospitalized for 3 days Had some symptoms of shortness of breath, and chest pain With intercurrent DVT of the left LE. Duration of anticoagulation: 8 months. End date 10/11/2014.  Anticoagulant: Lovenox 120 units daily Switched to Eliquis on 05/25/2014 per patient preference    Pulmonary embolism ([place])    Sebaceous cyst    on back of neck    Family History  Problem  Relation Age of Onset   Breast cancer Mother    Cancer Mother        small intestine   Liver cancer Mother    Diabetes Mother    Diabetes Father    Diabetes Brother    Hypertension Maternal Grandmother    Heart Problems Maternal Grandmother    Diabetes Paternal Grandmother    Diabetes Paternal Grandfather    Diabetes Brother     Past Surgical History:  Procedure Laterality Date   AMPUTATION Right 06/03/2015   Procedure: Right Below Knee Amputation;  Surgeon: [name], MD;  Location: [place];  Service: Orthopedics;  Laterality: Right;   AMPUTATION Left 07/24/2019   Procedure: LEFT FOOT 3RD RAY AMPUTATION;  Surgeon: [name], MD;  Location: [place];  Service: Orthopedics;  Laterality: Left;   AMPUTATION Left 06/23/2021   Procedure: LEFT 2ND TOE AMPUTATION;  Surgeon: [name], MD;  Location: [place];  Service: Orthopedics;  Laterality: Left;   AMPUTATION Right 08/21/2021   Procedure: AMPUTATION RIGHT INDEX FINGER;  Surgeon: Orene Desanctis, MD;  Location: [place];  Service: Orthopedics;  Laterality: Right;   AMPUTATION Left 09/29/2022   Procedure: AMPUTATION BELOW KNEE;  Surgeon: [name], MD;  Location: [place];  Service: Orthopedics;  Laterality: Left;   BUBBLE STUDY  01/24/2022   Procedure: BUBBLE STUDY;  Surgeon: Josue Hector, MD;  Location: [place];  Service: Cardiovascular;;   CLOSED REDUCTION WITH HUMER PIN INSERTION  1974   left hip   HARDWARE REMOVAL Left 07/21/2014   Procedure: HARDWARE REMOVAL;  Surgeon: Ninetta Lights, MD;  Location: [place];  Service: Orthopedics;  Laterality: Left;   I & D EXTREMITY Right 08/21/2021   Procedure: IRRIGATION AND DEBRIDEMENT RIGHT INDEX FINGER;  Surgeon: Orene Desanctis, MD;  Location: [place];  Service: Orthopedics;  Laterality: Right;   PATENT FORAMEN OVALE(PFO) CLOSURE N/A 02/28/2022   Procedure: PATENT FORAMEN OVALE(PFO) CLOSURE;  Surgeon: Sherren Mocha, MD;  Location: [facility];  Service: Cardiovascular;  Laterality:  N/A;   ROTATOR CUFF REPAIR Right 2005 (approx)   TEE WITHOUT CARDIOVERSION N/A 01/24/2022   Procedure: TRANSESOPHAGEAL ECHOCARDIOGRAM (TEE);  Surgeon: Josue Hector, MD;  Location: [facility]COPY;  Service: Cardiovascular;  Laterality: N/A;   TOTAL HIP ARTHROPLASTY Left 07/21/2014   Procedure: TOTAL HIP ARTHROPLASTY ANTERIOR APPROACH;  Surgeon: Ninetta Lights, MD;  Location: [place];  Service: Orthopedics;  Laterality: Left;   TOTAL HIP ARTHROPLASTY Right 2006 (approx)   right hip replaced   Social History   Occupational History   Occupation: disabled  Tobacco Use   Smoking status: Never   Smokeless tobacco: Never  Vaping Use   Vaping Use: Never used  Substance and Sexual Activity   Alcohol use: Not Currently    Comment: beer and mixed drink maybe 5  times a month   Drug use: No   Sexual activity: Never

## 2022-10-12 ENCOUNTER — Telehealth: Payer: Self-pay | Admitting: Radiology

## 2022-10-12 NOTE — Telephone Encounter (Addendum)
  Please make sure to follow up on this on Monday per LF.  See if facility can take him on Monday.   ----- Message from Lendon Collar, RT sent at 10/12/2022 12:45 PM EST ----- Damita Dunnings from [facility] called. An ultrasound was ordered STAT today for patient to r/o DVT. Apparently the facility he is staying at does not have transportation services for him today. He was then scheduled for next Tuesday. Duncan's concern is that this is ordered STAT, and waiting until Tuesday is defintely not STAT. He wanted to make Erin aware about this and see if there was something we could possibly do to either speed this along, or communicate with his facility of things to look for in case a problem arises before then. His call back is [phone] if needed.

## 2022-10-15 ENCOUNTER — Telehealth: Payer: Self-pay | Admitting: Orthopedic Surgery

## 2022-10-15 NOTE — Telephone Encounter (Signed)
It was faxed to [facility] this morning. Will notify pt.

## 2022-10-15 NOTE — Telephone Encounter (Signed)
I SW pt already, we talked on the phone at that time. Stated [facility] had questions about transfers before they could schedule him. [facility] can get him in today at 12pm, but waiting to hear back from [facility] on transportation there. I left them a detailed message and asked for them to call me back.. this is a stat referral and getting this done today is important.

## 2022-10-15 NOTE — Telephone Encounter (Signed)
Faxed Rx to [facility] at [phone]

## 2022-10-15 NOTE — Telephone Encounter (Signed)
Patient called in stating Gregory May was trying to get in touch with him about [place] appt please advise, patient states he was on the phone with the vein specialist

## 2022-10-15 NOTE — Telephone Encounter (Signed)
SW pt, I let him know that [facility] has not contacted me about a visit for today. Pt did state that [facility] did call him recently to confirm his appt there tomorrow and he does not know if transportation team knows to take him for this appt. I will check in on this

## 2022-10-15 NOTE — Telephone Encounter (Signed)
Pt is c/o burning, sharpe pain in his left stump. These are very short episodes and last a few seconds put are frequent. He is only taking tylenol for pain. Asking if he can have something in addition to help. He does have an u/s to r/o DVT tomorrow as well. We tried getting him seen today at [facility], but transportation is an issue with [employer].

## 2022-10-15 NOTE — Telephone Encounter (Signed)
Patient asking about his pain medication

## 2022-10-15 NOTE — Telephone Encounter (Signed)
Pt informed

## 2022-10-15 NOTE — Telephone Encounter (Signed)
[facility]. Transferred to [place] with transportation, left another VM asking if anyone was contacted about his u/s that has been scheduled 10/16/22 at 11:30 and he will need to go to this appt that has been scheduled since 10/12/22.

## 2022-10-15 NOTE — Telephone Encounter (Signed)
[facility]. Left message with transportation dept. [facility] can get him in today at 12 pm. Waiting for a call back.

## 2022-10-15 NOTE — Telephone Encounter (Signed)
Messaged Gregory May to see what time they had today so I can let facility know before I call in hopes they will bring him to appt.

## 2022-10-16 ENCOUNTER — Telehealth: Payer: Self-pay

## 2022-10-16 ENCOUNTER — Ambulatory Visit (HOSPITAL_COMMUNITY)
Admission: RE | Admit: 2022-10-16 | Discharge: 2022-10-16 | Disposition: A | Discharge status: Home / Self Care | Payer: Medicare HMO | Source: Ambulatory Visit | Attending: Family | Admitting: Family

## 2022-10-16 DIAGNOSIS — M79652 Pain in left thigh: Secondary | ICD-10-CM | POA: Insufficient documentation

## 2022-10-16 MED ORDER — RIVAROXABAN (XARELTO) VTE STARTER PACK (15 & 20 MG): ORAL_TABLET | ORAL | 0 refills | Status: DC

## 2022-10-16 NOTE — Telephone Encounter (Signed)
Manuela Schwartz from vascular called and talked with Dr Sharol Given about patients doppler results.  Dr Sharol Given would like patient to start Xarelto starter pack based off of results provided to him.  I called [facility] and spoke with patients nurse Caren Griffins regarding this.  She asked that script be faxed [facility] in [place] who delivers medications to their facility. I submitted rx.

## 2022-10-17 ENCOUNTER — Telehealth: Payer: Self-pay | Admitting: Orthopedic Surgery

## 2022-10-17 NOTE — Telephone Encounter (Signed)
Patient called advised he still have the pump to the wound vac. Patient asked if he should bring the pump with him. Patient said he is out of his pain medication. (Hydrocodone)  Patient asked if he should bring the thing that keeps you from bumping leg. The number to contact patient is [phone]

## 2022-10-18 DIAGNOSIS — Z89512 Acquired absence of left leg below knee: Secondary | ICD-10-CM | POA: Diagnosis not present

## 2022-10-18 DIAGNOSIS — M6281 Muscle weakness (generalized): Secondary | ICD-10-CM | POA: Diagnosis not present

## 2022-10-18 DIAGNOSIS — R2689 Other abnormalities of gait and mobility: Secondary | ICD-10-CM | POA: Diagnosis not present

## 2022-10-18 DIAGNOSIS — R2681 Unsteadiness on feet: Secondary | ICD-10-CM | POA: Diagnosis not present

## 2022-10-18 NOTE — Telephone Encounter (Signed)
I called and sw pt. Advised that the vac is disposable and that he does not need to bring that in. The facility nurse can throw that away. He should continue using the limb protector daily when out of bed and the facility uses its own pharmacy and that they will regulate his medication. He said that they have cut back on the dose and tha the are elongating th time interval in between doses. He will keep his appt as sch and cal with any other concerns.

## 2022-10-19 ENCOUNTER — Telehealth: Payer: Self-pay

## 2022-10-19 NOTE — Patient Instructions (Signed)
Visit Information  Thank you for taking time to visit with me today. Please don't hesitate to contact me if I can be of assistance to you.   Following are the goals we discussed today:   Goals Addressed   None      If you are experiencing a Mental Health or [place] or need someone to talk to, please call the Suicide and Crisis Lifeline: 988   The patient verbalized understanding of instructions, educational materials, and care plan provided today and DECLINED offer to receive copy of patient instructions, educational materials, and care plan.   The patient has been provided with contact information for the care management team and has been advised to call with any health related questions or concerns.   Jone Baseman, RN, MSN [facility] Management Care Management Coordinator Direct Line [phone]

## 2022-10-19 NOTE — Patient Outreach (Signed)
  Care Coordination   Follow Up Visit Note   10/19/2022 Name: AMARIUS TOTO MRN: [id number] DOB: [birth year]  Garnet Koyanagi Ursua is a 62 y.o. year old male who sees Idamae Schuller, MD for primary care. I spoke with  Darl T Fouse by phone today.  He states he is currently at [facility] after Amputation.  Possible discharge on Tuesday.  He will be going to his aunt's home.  Will speak with Marthenia Rolling about following up. Will also send Jane a message.  What matters to the patients health and wellness today?  Getting out of rehab    Goals Addressed   None     SDOH assessments and interventions completed:  No     Care Coordination Interventions:  Yes, provided   Follow up plan:  Follow up planned for when patient discharges from SNF    Encounter Outcome:  Pt. Visit Completed   Jone Baseman, RN, MSN [facility] Care Management Coordinator Direct Line [phone]

## 2022-10-22 ENCOUNTER — Ambulatory Visit (INDEPENDENT_AMBULATORY_CARE_PROVIDER_SITE_OTHER): Payer: Medicare HMO | Admitting: Orthopedic Surgery

## 2022-10-22 DIAGNOSIS — R2689 Other abnormalities of gait and mobility: Secondary | ICD-10-CM | POA: Diagnosis not present

## 2022-10-22 DIAGNOSIS — Z89512 Acquired absence of left leg below knee: Secondary | ICD-10-CM

## 2022-10-22 DIAGNOSIS — S88111A Complete traumatic amputation at level between knee and ankle, right lower leg, initial encounter: Secondary | ICD-10-CM

## 2022-10-22 DIAGNOSIS — E1165 Type 2 diabetes mellitus with hyperglycemia: Secondary | ICD-10-CM | POA: Diagnosis not present

## 2022-10-22 DIAGNOSIS — M6281 Muscle weakness (generalized): Secondary | ICD-10-CM | POA: Diagnosis not present

## 2022-10-22 DIAGNOSIS — Z89511 Acquired absence of right leg below knee: Secondary | ICD-10-CM

## 2022-10-22 DIAGNOSIS — I82401 Acute embolism and thrombosis of unspecified deep veins of right lower extremity: Secondary | ICD-10-CM | POA: Diagnosis not present

## 2022-10-22 DIAGNOSIS — R2681 Unsteadiness on feet: Secondary | ICD-10-CM | POA: Diagnosis not present

## 2022-10-22 DIAGNOSIS — L03116 Cellulitis of left lower limb: Secondary | ICD-10-CM | POA: Diagnosis not present

## 2022-10-24 ENCOUNTER — Encounter: Payer: Self-pay | Admitting: Orthopedic Surgery

## 2022-10-24 NOTE — Progress Notes (Signed)
Office Visit Note   Patient: Gregory May           Date of Birth: 1961/03/19           MRN: [id number] Visit Date: 10/22/2022              Requested by: Idamae Schuller, MD [address] [place],  [place] [address] PCP: Idamae Schuller, MD  Chief Complaint  Patient presents with   Left Leg - Routine Post Op    09/29/22 left BKA      HPI: Patient is a 62 year old gentleman who is 3 weeks status post left transtibial amputation.  Patient is currently on a Xarelto starter pack for DVT.  Assessment & Plan: Visit Diagnoses:  1. Left below-knee amputee ([place])   2. Below-knee amputation of right lower extremity ([place])     Plan: Continue with Dial soap cleansing dry dressing and compression.  Staples are harvested.  Follow-Up Instructions: Return in about 4 weeks (around 11/19/2022).   Ortho Exam  Patient is alert, oriented, no adenopathy, well-dressed, normal affect, normal respiratory effort. Examination there is some mild ischemic changes and some ischemic pain there is no cellulitis or drainage.  Imaging: No results found.    Labs: Lab Results  Component Value Date   HGBA1C 6.9 (H) 09/24/2022   HGBA1C 7.2 (H) 03/08/2022   HGBA1C 7.2 (H) 11/29/2021   ESRSEDRATE 11 11/15/2021   ESRSEDRATE 25 (H) 08/19/2021   ESRSEDRATE 8 07/15/2019   CRP 0.7 11/17/2021   CRP <0.5 11/15/2021   CRP 1.7 (H) 08/19/2021   REPTSTATUS 10/03/2022 FINAL 09/28/2022   REPTSTATUS 10/03/2022 FINAL 09/28/2022   GRAMSTAIN  08/21/2021    WBC PRESENT,BOTH PMN AND MONONUCLEAR NO ORGANISMS SEEN    CULT  09/28/2022    NO GROWTH 5 DAYS Performed at [facility], [place] [address] [place], [place] [address]    CULT  09/28/2022    NO GROWTH 5 DAYS Performed at [facility] [address] [place], [place] [address]    LABORGA STAPHYLOCOCCUS HOMINIS 08/21/2021     Lab Results  Component Value Date   ALBUMIN 2.8 (L) 09/28/2022   ALBUMIN 4.1 09/24/2022   ALBUMIN 4.2 03/08/2022     Lab Results  Component Value Date   MG 1.9 11/15/2021   Lab Results  Component Value Date   VD25OH 16 (L) 11/05/2013    No results found for: "PREALBUMIN"    Latest Ref Rng & Units 10/03/2022    2:13 AM 10/01/2022    5:02 AM 09/30/2022    4:06 AM  CBC EXTENDED  WBC 4.0 - 10.5 K/uL 12.8  12.6  14.0   RBC 4.22 - 5.81 MIL/uL 4.26  4.25  3.84   Hemoglobin 13.0 - 17.0 g/dL 13.2  12.8  11.8   HCT 39.0 - 52.0 % 39.1  39.4  36.3   Platelets 150 - 400 K/uL 413  369  325      There is no height or weight on file to calculate BMI.  Orders:  No orders of the defined types were placed in this encounter.  No orders of the defined types were placed in this encounter.    Procedures: No procedures performed  Clinical Data: No additional findings.  ROS:  All other systems negative, except as noted in the HPI. Review of Systems  Objective: Vital Signs: There were no vitals taken for this visit.  Specialty Comments:  No specialty comments available.  PMFS History: Patient Active Problem List   Diagnosis Date Noted   Recurrent acute deep vein thrombosis (DVT) of lower extremity ([place]) 10/03/2022   Lower extremity cellulitis 09/29/2022   Acute osteomyelitis of left foot ([place]) 09/29/2022   PFO with atrial septal aneurysm    Long term (current) use of anticoagulants 02/26/2022   Right retinal embolus 01/15/2022   DM type 2 with diabetic peripheral neuropathy ([place]) 01/15/2022   Obstructive sleep apnea 01/15/2022   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   Transportation insecurity 11/23/2021   Retinal artery occlusion, central 11/15/2021   Epidermal inclusion cyst 10/18/2021   Amputated finger 09/05/2021   Osteomyelitis of finger of right hand ([place]) 08/19/2021   Hepatic steatosis 06/24/2020   Back pain 04/20/2020   Left knee pain 04/20/2020   Allergic sinusitis 04/20/2020   Elevated liver enzymes 04/20/2020   Cutaneous abscess of left foot    Diastasis of rectus  abdominis 07/16/2019   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 10/07/2018   Essential hypertension 06/19/2018   Peripheral neuropathy 12/19/2017   Left below-knee amputee ([place]) 04/17/2017   Carpal tunnel syndrome 03/18/2017   Status post below knee amputation of right lower extremity ([place]) 06/07/2015   Diabetic polyneuropathy associated with type 2 diabetes mellitus ([place]) 12/09/2014   Diabetes mellitus with carpal tunnel syndrome ([place]) 12/09/2014   DJD (degenerative joint disease) of knee 07/21/2014   Osteoarthritis, hip, bilateral 05/25/2014   Onychomycosis 10/14/2013   Pure hypercholesterolemia 09/25/2013   Poorly controlled type 2 diabetes mellitus with complication ([place]) [id number]   Past Medical History:  Diagnosis Date   Arthritis    bilateral hips   Cutaneous abscess of left foot    Deep vein thrombosis (DVT) (HCC)    Diabetes mellitus    type II   Diabetic ulcer of heel (HCC)    Right heel   DJD (degenerative joint disease)    DVT (deep venous thrombosis) ([place]) 02/08/2014   Proximal provoked. Date of diagnosis February 08 2014 Duration of anticoagulation: 6 months. End date 08/12/2014.  Anticoagulant: Lovenox 120 units daily Switched to Eliquis on 05/25/2014      Ear drum perforation, right 03/06/2019   Non-pressure chronic ulcer of right calf, limited to breakdown of skin ([place]) 04/17/2017   Pulmonary emboli ([place]) 02/08/2014   Date of diagnosis February 08 2014, on chest CTA Hospitalized for 3 days Had some symptoms of shortness of breath, and chest pain With intercurrent DVT of the left LE. Duration of anticoagulation: 8 months. End date 10/11/2014.  Anticoagulant: Lovenox 120 units daily Switched to Eliquis on 05/25/2014 per patient preference    Pulmonary embolism ([place])    Sebaceous cyst    on back of neck    Family History  Problem Relation Age of Onset   Breast cancer Mother    Cancer Mother        small intestine   Liver cancer  Mother    Diabetes Mother    Diabetes Father    Diabetes Brother    Hypertension Maternal Grandmother    Heart Problems Maternal Grandmother    Diabetes Paternal Grandmother    Diabetes Paternal Grandfather    Diabetes Brother     Past Surgical History:  Procedure Laterality Date   AMPUTATION Right 06/03/2015   Procedure: Right Below Knee Amputation;  Surgeon: [name], MD;  Location: [place];  Service: Orthopedics;  Laterality: Right;   AMPUTATION Left 07/24/2019   Procedure: LEFT FOOT  3RD RAY AMPUTATION;  Surgeon: [name], MD;  Location: [place];  Service: Orthopedics;  Laterality: Left;   AMPUTATION Left 06/23/2021   Procedure: LEFT 2ND TOE AMPUTATION;  Surgeon: [name], MD;  Location: [place];  Service: Orthopedics;  Laterality: Left;   AMPUTATION Right 08/21/2021   Procedure: AMPUTATION RIGHT INDEX FINGER;  Surgeon: Orene Desanctis, MD;  Location: [place];  Service: Orthopedics;  Laterality: Right;   AMPUTATION Left 09/29/2022   Procedure: AMPUTATION BELOW KNEE;  Surgeon: [name], MD;  Location: [place];  Service: Orthopedics;  Laterality: Left;   BUBBLE STUDY  01/24/2022   Procedure: BUBBLE STUDY;  Surgeon: Josue Hector, MD;  Location: [place];  Service: Cardiovascular;;   CLOSED REDUCTION WITH HUMER PIN INSERTION  1974   left hip   HARDWARE REMOVAL Left 07/21/2014   Procedure: HARDWARE REMOVAL;  Surgeon: Ninetta Lights, MD;  Location: [place];  Service: Orthopedics;  Laterality: Left;   I & D EXTREMITY Right 08/21/2021   Procedure: IRRIGATION AND DEBRIDEMENT RIGHT INDEX FINGER;  Surgeon: Orene Desanctis, MD;  Location: [place];  Service: Orthopedics;  Laterality: Right;   PATENT FORAMEN OVALE(PFO) CLOSURE N/A 02/28/2022   Procedure: PATENT FORAMEN OVALE(PFO) CLOSURE;  Surgeon: Sherren Mocha, MD;  Location: [place] [facility];  Service: Cardiovascular;  Laterality: N/A;   ROTATOR CUFF REPAIR Right 2005 (approx)   TEE WITHOUT CARDIOVERSION N/A 01/24/2022   Procedure:  TRANSESOPHAGEAL ECHOCARDIOGRAM (TEE);  Surgeon: Josue Hector, MD;  Location: [facility] ENDOSCOPY;  Service: Cardiovascular;  Laterality: N/A;   TOTAL HIP ARTHROPLASTY Left 07/21/2014   Procedure: TOTAL HIP ARTHROPLASTY ANTERIOR APPROACH;  Surgeon: Ninetta Lights, MD;  Location: [place];  Service: Orthopedics;  Laterality: Left;   TOTAL HIP ARTHROPLASTY Right 2006 (approx)   right hip replaced   Social History   Occupational History   Occupation: disabled  Tobacco Use   Smoking status: Never   Smokeless tobacco: Never  Vaping Use   Vaping Use: Never used  Substance and Sexual Activity   Alcohol use: Not Currently    Comment: beer and mixed drink maybe 5  times a month   Drug use: No   Sexual activity: Never

## 2022-10-25 DIAGNOSIS — R2681 Unsteadiness on feet: Secondary | ICD-10-CM | POA: Diagnosis not present

## 2022-10-25 DIAGNOSIS — Z89512 Acquired absence of left leg below knee: Secondary | ICD-10-CM | POA: Diagnosis not present

## 2022-10-25 DIAGNOSIS — R2689 Other abnormalities of gait and mobility: Secondary | ICD-10-CM | POA: Diagnosis not present

## 2022-10-25 DIAGNOSIS — M6281 Muscle weakness (generalized): Secondary | ICD-10-CM | POA: Diagnosis not present

## 2022-10-26 ENCOUNTER — Telehealth: Payer: Self-pay | Admitting: Orthopedic Surgery

## 2022-10-26 DIAGNOSIS — Z89511 Acquired absence of right leg below knee: Secondary | ICD-10-CM | POA: Diagnosis not present

## 2022-10-26 DIAGNOSIS — E1165 Type 2 diabetes mellitus with hyperglycemia: Secondary | ICD-10-CM | POA: Diagnosis not present

## 2022-10-26 DIAGNOSIS — M6281 Muscle weakness (generalized): Secondary | ICD-10-CM | POA: Diagnosis not present

## 2022-10-26 DIAGNOSIS — I82401 Acute embolism and thrombosis of unspecified deep veins of right lower extremity: Secondary | ICD-10-CM | POA: Diagnosis not present

## 2022-10-26 NOTE — Telephone Encounter (Signed)
[facility] still has patient on Morphine and cannot take the  RIVAROXABAN Alveda Reasons) VTE STARTER PACK (15 & 20 MG)  he is not supposed to be taking both please advise, [place] called in stating she is holding it until Dr Sharol Given confirms this please advise [place] at [phone]

## 2022-10-29 ENCOUNTER — Other Ambulatory Visit: Payer: Self-pay | Admitting: *Deleted

## 2022-10-29 DIAGNOSIS — G4733 Obstructive sleep apnea (adult) (pediatric): Secondary | ICD-10-CM | POA: Diagnosis not present

## 2022-10-29 DIAGNOSIS — K760 Fatty (change of) liver, not elsewhere classified: Secondary | ICD-10-CM | POA: Diagnosis not present

## 2022-10-29 DIAGNOSIS — E7800 Pure hypercholesterolemia, unspecified: Secondary | ICD-10-CM | POA: Diagnosis not present

## 2022-10-29 DIAGNOSIS — L03119 Cellulitis of unspecified part of limb: Secondary | ICD-10-CM | POA: Diagnosis not present

## 2022-10-29 DIAGNOSIS — H3411 Central retinal artery occlusion, right eye: Secondary | ICD-10-CM | POA: Diagnosis not present

## 2022-10-29 DIAGNOSIS — M160 Bilateral primary osteoarthritis of hip: Secondary | ICD-10-CM | POA: Diagnosis not present

## 2022-10-29 DIAGNOSIS — Z4781 Encounter for orthopedic aftercare following surgical amputation: Secondary | ICD-10-CM | POA: Diagnosis not present

## 2022-10-29 DIAGNOSIS — G629 Polyneuropathy, unspecified: Secondary | ICD-10-CM | POA: Diagnosis not present

## 2022-10-29 DIAGNOSIS — E1142 Type 2 diabetes mellitus with diabetic polyneuropathy: Secondary | ICD-10-CM | POA: Diagnosis not present

## 2022-10-29 NOTE — Patient Outreach (Addendum)
Verified in [facility] Mr. Wendorff discharged from [facility] on 10/27/22.   Will alert [facility] care coordination team.   Message sent to [facility] social worker to inquire about home health agency arrangements.   Addendum: Update from [place], Education officer, museum with [facility]. Home health PT/OT/nursing arranged thru [facility]. Wheelchair was also ordered.   Marthenia Rolling, MSN, RN,BSN [place] Acute [facility] Coordinator [phone] (Direct dial)

## 2022-10-29 NOTE — Telephone Encounter (Signed)
LM on VM for Meredith to d/c morphine and start xarelto. Will need to send in another pain med to [facility].

## 2022-10-30 ENCOUNTER — Telehealth: Payer: Self-pay | Admitting: *Deleted

## 2022-10-30 NOTE — Progress Notes (Signed)
  Care Coordination   Note   10/30/2022 Name: Gregory May MRN: [id number] DOB: 02/28/1961  [name] is a 62 y.o. year old male who sees Idamae Schuller, MD for primary care. I reached out to Cayne T Godby by phone today to offer care coordination services.  [name] was given information about Care Coordination services today including:   The Care Coordination services include support from the care team which includes your Nurse Coordinator, Clinical Social Worker, or Pharmacist.  The Care Coordination team is here to help remove barriers to the health concerns and goals most important to you. Care Coordination services are voluntary, and the patient may decline or stop services at any time by request to their care team member.   Care Coordination Consent Status: Patient agreed to services and verbal consent obtained.   Follow up plan:  Telephone appointment with care coordination team member scheduled for:  11/02/22  Encounter Outcome:  Pt. Scheduled  [place]  Direct Dial: [phone]

## 2022-10-30 NOTE — Progress Notes (Signed)
  Care Coordination  Outreach Note  10/30/2022 Name: SHUAIB ROSAUER MRN: [id number] DOB: [birth year]   Care Coordination Outreach Attempts: An unsuccessful telephone outreach was attempted today to offer the patient information about available care coordination services as a benefit of their health plan.   Follow Up Plan:  Additional outreach attempts will be made to offer the patient care coordination information and services.   Encounter Outcome:  No Answer  [place]  Direct Dial: [phone]

## 2022-10-31 DIAGNOSIS — L03119 Cellulitis of unspecified part of limb: Secondary | ICD-10-CM | POA: Diagnosis not present

## 2022-10-31 DIAGNOSIS — G4733 Obstructive sleep apnea (adult) (pediatric): Secondary | ICD-10-CM | POA: Diagnosis not present

## 2022-10-31 DIAGNOSIS — H3411 Central retinal artery occlusion, right eye: Secondary | ICD-10-CM | POA: Diagnosis not present

## 2022-10-31 DIAGNOSIS — M160 Bilateral primary osteoarthritis of hip: Secondary | ICD-10-CM | POA: Diagnosis not present

## 2022-10-31 DIAGNOSIS — Z4781 Encounter for orthopedic aftercare following surgical amputation: Secondary | ICD-10-CM | POA: Diagnosis not present

## 2022-10-31 DIAGNOSIS — E7800 Pure hypercholesterolemia, unspecified: Secondary | ICD-10-CM | POA: Diagnosis not present

## 2022-10-31 DIAGNOSIS — K760 Fatty (change of) liver, not elsewhere classified: Secondary | ICD-10-CM | POA: Diagnosis not present

## 2022-10-31 DIAGNOSIS — G629 Polyneuropathy, unspecified: Secondary | ICD-10-CM | POA: Diagnosis not present

## 2022-10-31 DIAGNOSIS — E1142 Type 2 diabetes mellitus with diabetic polyneuropathy: Secondary | ICD-10-CM | POA: Diagnosis not present

## 2022-11-01 ENCOUNTER — Telehealth: Payer: Self-pay

## 2022-11-01 DIAGNOSIS — M160 Bilateral primary osteoarthritis of hip: Secondary | ICD-10-CM | POA: Diagnosis not present

## 2022-11-01 DIAGNOSIS — H3411 Central retinal artery occlusion, right eye: Secondary | ICD-10-CM | POA: Diagnosis not present

## 2022-11-01 DIAGNOSIS — E7800 Pure hypercholesterolemia, unspecified: Secondary | ICD-10-CM | POA: Diagnosis not present

## 2022-11-01 DIAGNOSIS — L03119 Cellulitis of unspecified part of limb: Secondary | ICD-10-CM | POA: Diagnosis not present

## 2022-11-01 DIAGNOSIS — E1142 Type 2 diabetes mellitus with diabetic polyneuropathy: Secondary | ICD-10-CM | POA: Diagnosis not present

## 2022-11-01 DIAGNOSIS — K760 Fatty (change of) liver, not elsewhere classified: Secondary | ICD-10-CM | POA: Diagnosis not present

## 2022-11-01 DIAGNOSIS — G4733 Obstructive sleep apnea (adult) (pediatric): Secondary | ICD-10-CM | POA: Diagnosis not present

## 2022-11-01 DIAGNOSIS — G629 Polyneuropathy, unspecified: Secondary | ICD-10-CM | POA: Diagnosis not present

## 2022-11-01 DIAGNOSIS — Z4781 Encounter for orthopedic aftercare following surgical amputation: Secondary | ICD-10-CM | POA: Diagnosis not present

## 2022-11-01 NOTE — Telephone Encounter (Signed)
Aletha from [facility] called regarding clarification for Trise[place]. The nurse says that the pts instructions are to take 12 units at bed time but the pt has been taking 4 units due to having low blood sugars. Please advise

## 2022-11-02 ENCOUNTER — Ambulatory Visit: Payer: Self-pay

## 2022-11-02 NOTE — Patient Outreach (Signed)
  Care Coordination   Initial Visit Note   11/02/2022 Name: Gregory May MRN: [id number] DOB: 1960-09-12  Gregory May is a 62 y.o. year old male who sees Gregory Schuller, MD for primary care. I spoke with  Gregory May by phone today.  What matters to the patients health and wellness today?  Gregory May informed me that he would like to reschedule our meeting to a later time, specifically a couple of months later. Currently, he is being contacted by multiple individuals and is feeling overwhelmed. He expressed hope that by the time we reschedule for May, everything will be settled, and he will be happy to have our discussion.    Goals Addressed             This Visit's Progress    Care Coordination Activites - No follow up required        Care Coordination Interventions: Active listening / Reflection utilized  Emotional Support Provided Problem Gregory May strategies reviewed Discussed/.Educated Care Coordination Program 2.   Discussed/.Educated Annual Wellness Visit 3.   Discussed/.Educated Social Determinates of Health 4.   Please inform PCP if services needed in the future Will follow up with the patient in 2 months         SDOH assessments and interventions completed:  No     Care Coordination Interventions:  Yes, provided    Interventions Today    Flowsheet Row Most Recent Value  General Interventions   General Interventions Discussed/Reviewed General Interventions Discussed  [Dicussed the program]       Follow up plan: Follow up call scheduled for 5/24 /24  230    Encounter Outcome:  Pt. Visit Completed   Gregory Arms RN, BSN, Gregory May   Phone: [phone]

## 2022-11-02 NOTE — Patient Instructions (Signed)
Visit Information  Thank you for taking time to visit with me today. Please don't hesitate to contact me if I can be of assistance to you.   Following are the goals we discussed today:   Goals Addressed             This Visit's Progress    Care Coordination Activites - No follow up required        Care Coordination Interventions: Active listening / Reflection utilized  Emotional Support Provided Problem Maribel strategies reviewed Discussed/.Educated Care Coordination Program 2.   Discussed/.Educated Annual Wellness Visit 3.   Discussed/.Educated Social Determinates of Health 4.   Please inform PCP if services needed in the future Will follow up with the patient in 2 months         Our next appointment is by telephone on 01/04/23 at 230 pm  Please call the care guide team at [phone] if you need to cancel or reschedule your appointment.   If you are experiencing a Mental Health or [place] or need someone to talk to, please call 1-800-273-TALK (toll free, 24 hour hotline)  The patient verbalized understanding of instructions, educational materials, and care plan provided today.    Lazaro Arms RN, BSN, [facility]   Phone: [phone]

## 2022-11-03 ENCOUNTER — Other Ambulatory Visit: Payer: Self-pay | Admitting: Orthopaedic Surgery

## 2022-11-05 ENCOUNTER — Telehealth: Payer: Self-pay | Admitting: Orthopedic Surgery

## 2022-11-05 ENCOUNTER — Other Ambulatory Visit: Payer: Self-pay | Admitting: Orthopedic Surgery

## 2022-11-05 MED ORDER — HYDROCODONE-ACETAMINOPHEN 5-325 MG PO TABS: 1.0000 | ORAL_TABLET | Freq: Four times a day (QID) | ORAL | 0 refills | Status: DC | PRN

## 2022-11-05 NOTE — Telephone Encounter (Signed)
Pt called requesting a call. Pt states he states his amputated leg has been hurting and leaking. Pt states he spoke to on call Dr Ninfa Linden. Pt is asking to been seen. Please call pt at [phone].

## 2022-11-05 NOTE — Telephone Encounter (Signed)
Pt states he spoke with Dr Sharol Given earlier and forgot to ask for refill of pain medication. Please send to pharmacy on file. Pt phone number is [phone]

## 2022-11-05 NOTE — Telephone Encounter (Signed)
you spoke with him on the phone today about his BKA. Pt is requesting refill of pain medication to be sent to his pharmacy on file. He was just d/c form a SNF last refill was 10/27/2022 Hydrocodone 5/325 #28 please advise.

## 2022-11-05 NOTE — Telephone Encounter (Signed)
SW pt, he says he called on call MD this weekend for this issue. He says that now it is not hurting as bad, having burning pain from time to time. Still having drainage, pink/red/clear. Using ABD pad and ace wrap once a day. He has #15 tabs of doxycycline left. He said this started when he was noticing putting pressure on stump in wheelchair ( not at incision site). He is s/p left BKA.

## 2022-11-06 DIAGNOSIS — L03119 Cellulitis of unspecified part of limb: Secondary | ICD-10-CM | POA: Diagnosis not present

## 2022-11-06 DIAGNOSIS — E7800 Pure hypercholesterolemia, unspecified: Secondary | ICD-10-CM | POA: Diagnosis not present

## 2022-11-06 DIAGNOSIS — Z4781 Encounter for orthopedic aftercare following surgical amputation: Secondary | ICD-10-CM | POA: Diagnosis not present

## 2022-11-06 DIAGNOSIS — K760 Fatty (change of) liver, not elsewhere classified: Secondary | ICD-10-CM | POA: Diagnosis not present

## 2022-11-06 DIAGNOSIS — G4733 Obstructive sleep apnea (adult) (pediatric): Secondary | ICD-10-CM | POA: Diagnosis not present

## 2022-11-06 DIAGNOSIS — G629 Polyneuropathy, unspecified: Secondary | ICD-10-CM | POA: Diagnosis not present

## 2022-11-06 DIAGNOSIS — H3411 Central retinal artery occlusion, right eye: Secondary | ICD-10-CM | POA: Diagnosis not present

## 2022-11-06 DIAGNOSIS — M160 Bilateral primary osteoarthritis of hip: Secondary | ICD-10-CM | POA: Diagnosis not present

## 2022-11-06 DIAGNOSIS — E1142 Type 2 diabetes mellitus with diabetic polyneuropathy: Secondary | ICD-10-CM | POA: Diagnosis not present

## 2022-11-07 DIAGNOSIS — Z4781 Encounter for orthopedic aftercare following surgical amputation: Secondary | ICD-10-CM | POA: Diagnosis not present

## 2022-11-07 DIAGNOSIS — H3411 Central retinal artery occlusion, right eye: Secondary | ICD-10-CM | POA: Diagnosis not present

## 2022-11-07 DIAGNOSIS — E7800 Pure hypercholesterolemia, unspecified: Secondary | ICD-10-CM | POA: Diagnosis not present

## 2022-11-07 DIAGNOSIS — G4733 Obstructive sleep apnea (adult) (pediatric): Secondary | ICD-10-CM | POA: Diagnosis not present

## 2022-11-07 DIAGNOSIS — E1142 Type 2 diabetes mellitus with diabetic polyneuropathy: Secondary | ICD-10-CM | POA: Diagnosis not present

## 2022-11-07 DIAGNOSIS — G629 Polyneuropathy, unspecified: Secondary | ICD-10-CM | POA: Diagnosis not present

## 2022-11-07 DIAGNOSIS — L03119 Cellulitis of unspecified part of limb: Secondary | ICD-10-CM | POA: Diagnosis not present

## 2022-11-07 DIAGNOSIS — K760 Fatty (change of) liver, not elsewhere classified: Secondary | ICD-10-CM | POA: Diagnosis not present

## 2022-11-07 DIAGNOSIS — M160 Bilateral primary osteoarthritis of hip: Secondary | ICD-10-CM | POA: Diagnosis not present

## 2022-11-13 ENCOUNTER — Other Ambulatory Visit: Payer: Self-pay

## 2022-11-13 ENCOUNTER — Telehealth: Payer: Self-pay | Admitting: Orthopedic Surgery

## 2022-11-13 NOTE — Telephone Encounter (Signed)
[facility] prosthetist called and needs  to be contacted in reference to paperwork that need to be signed before the appointment on Monday-please call [phone] [place])

## 2022-11-14 ENCOUNTER — Other Ambulatory Visit: Payer: Self-pay | Admitting: Pharmacist

## 2022-11-14 ENCOUNTER — Telehealth: Payer: Self-pay | Admitting: Orthopedic Surgery

## 2022-11-14 DIAGNOSIS — Z4781 Encounter for orthopedic aftercare following surgical amputation: Secondary | ICD-10-CM | POA: Diagnosis not present

## 2022-11-14 DIAGNOSIS — G629 Polyneuropathy, unspecified: Secondary | ICD-10-CM | POA: Diagnosis not present

## 2022-11-14 DIAGNOSIS — M160 Bilateral primary osteoarthritis of hip: Secondary | ICD-10-CM | POA: Diagnosis not present

## 2022-11-14 DIAGNOSIS — G4733 Obstructive sleep apnea (adult) (pediatric): Secondary | ICD-10-CM | POA: Diagnosis not present

## 2022-11-14 DIAGNOSIS — E7800 Pure hypercholesterolemia, unspecified: Secondary | ICD-10-CM | POA: Diagnosis not present

## 2022-11-14 DIAGNOSIS — H3411 Central retinal artery occlusion, right eye: Secondary | ICD-10-CM | POA: Diagnosis not present

## 2022-11-14 DIAGNOSIS — L03119 Cellulitis of unspecified part of limb: Secondary | ICD-10-CM | POA: Diagnosis not present

## 2022-11-14 DIAGNOSIS — K760 Fatty (change of) liver, not elsewhere classified: Secondary | ICD-10-CM | POA: Diagnosis not present

## 2022-11-14 DIAGNOSIS — E1142 Type 2 diabetes mellitus with diabetic polyneuropathy: Secondary | ICD-10-CM | POA: Diagnosis not present

## 2022-11-14 MED ORDER — WARFARIN SODIUM 5 MG PO TABS: ORAL_TABLET | ORAL | 2 refills | Status: DC

## 2022-11-14 NOTE — Telephone Encounter (Addendum)
Patient called advised he only have 1 antibiotic left of the Doxycyline. Patient asked if he need another Rx for that medication? Patient said his stump is still draining and is not healing up good. Patient said the nurse asked him to ask Dr. Sharol Given should he use some triple antibiotic on it or take zinc? The number to contact patient is [phone]

## 2022-11-14 NOTE — Telephone Encounter (Signed)
Message to Gregory May with [facility] to see what it is that she needs. To let me know specifically and I will make sure this is done prior to the pt's appt on 11/19/2022

## 2022-11-14 NOTE — Telephone Encounter (Signed)
Pt informed

## 2022-11-14 NOTE — Telephone Encounter (Signed)
Patient calls requesting refill on warfarin. Used DOAC rivaroxaban prescribed by Dr. Sharol Given ([facility]) while in SNF. At discharge, states he went back on warfarin.

## 2022-11-15 DIAGNOSIS — E7800 Pure hypercholesterolemia, unspecified: Secondary | ICD-10-CM | POA: Diagnosis not present

## 2022-11-15 DIAGNOSIS — E1142 Type 2 diabetes mellitus with diabetic polyneuropathy: Secondary | ICD-10-CM | POA: Diagnosis not present

## 2022-11-15 DIAGNOSIS — G4733 Obstructive sleep apnea (adult) (pediatric): Secondary | ICD-10-CM | POA: Diagnosis not present

## 2022-11-15 DIAGNOSIS — K760 Fatty (change of) liver, not elsewhere classified: Secondary | ICD-10-CM | POA: Diagnosis not present

## 2022-11-15 DIAGNOSIS — G629 Polyneuropathy, unspecified: Secondary | ICD-10-CM | POA: Diagnosis not present

## 2022-11-15 DIAGNOSIS — M160 Bilateral primary osteoarthritis of hip: Secondary | ICD-10-CM | POA: Diagnosis not present

## 2022-11-15 DIAGNOSIS — H3411 Central retinal artery occlusion, right eye: Secondary | ICD-10-CM | POA: Diagnosis not present

## 2022-11-15 DIAGNOSIS — L03119 Cellulitis of unspecified part of limb: Secondary | ICD-10-CM | POA: Diagnosis not present

## 2022-11-15 DIAGNOSIS — Z4781 Encounter for orthopedic aftercare following surgical amputation: Secondary | ICD-10-CM | POA: Diagnosis not present

## 2022-11-15 NOTE — Telephone Encounter (Signed)
Warfarin was refilled yesterday by Dr Elie Confer.

## 2022-11-15 NOTE — Telephone Encounter (Signed)
What medication does this patient need a refill of? I do not see a refill request attached. Not sure if Dr. Humphrey Rolls filled it or still needs to be addressed.

## 2022-11-16 ENCOUNTER — Telehealth: Payer: Self-pay | Admitting: Orthopedic Surgery

## 2022-11-16 NOTE — Telephone Encounter (Signed)
[facility] waiting on orders that were faxed to be signed. Please call Isabelle Course) [phone] ext [phone]  Order # [id number], faxed March 22

## 2022-11-16 NOTE — Telephone Encounter (Signed)
Dr. Lajoyce Corners completed all orders needing signature last night and picked up by medical records today.

## 2022-11-19 ENCOUNTER — Ambulatory Visit (INDEPENDENT_AMBULATORY_CARE_PROVIDER_SITE_OTHER): Payer: Medicare HMO | Admitting: Orthopedic Surgery

## 2022-11-19 ENCOUNTER — Encounter: Payer: Self-pay | Admitting: Orthopedic Surgery

## 2022-11-19 ENCOUNTER — Ambulatory Visit (INDEPENDENT_AMBULATORY_CARE_PROVIDER_SITE_OTHER): Payer: Medicare HMO | Admitting: Pharmacist

## 2022-11-19 DIAGNOSIS — H3411 Central retinal artery occlusion, right eye: Secondary | ICD-10-CM

## 2022-11-19 DIAGNOSIS — Z7901 Long term (current) use of anticoagulants: Secondary | ICD-10-CM | POA: Diagnosis not present

## 2022-11-19 DIAGNOSIS — H349 Unspecified retinal vascular occlusion: Secondary | ICD-10-CM | POA: Diagnosis not present

## 2022-11-19 DIAGNOSIS — Z89512 Acquired absence of left leg below knee: Secondary | ICD-10-CM

## 2022-11-19 DIAGNOSIS — Z89511 Acquired absence of right leg below knee: Secondary | ICD-10-CM

## 2022-11-19 DIAGNOSIS — S88111A Complete traumatic amputation at level between knee and ankle, right lower leg, initial encounter: Secondary | ICD-10-CM

## 2022-11-19 LAB — POCT INR: INR: 3 (ref 2.0–3.0)

## 2022-11-19 MED ORDER — HYDROCODONE-ACETAMINOPHEN 5-325 MG PO TABS: 1.0000 | ORAL_TABLET | Freq: Four times a day (QID) | ORAL | 0 refills | Status: DC | PRN

## 2022-11-19 NOTE — Progress Notes (Signed)
Anticoagulation Management Gregory May is a 62 y.o. male who reports to the clinic for monitoring of warfarin treatment.    Indication:  Retinal artery occlusion, central Right retinal embolus Long term (current) use of anticoagulants with target INR range 2.0 - 3.0.     Duration: indefinite Supervising physician: Debe Coder  Anticoagulation Clinic Visit History: Patient does not report signs/symptoms of bleeding or thromboembolism  Other recent changes: No diet, medications, lifestyle changes cited by the patient. Anticoagulation Episode Summary     Current INR goal:  2.0-3.0  TTR:  45.4 % (6.8 mo)  Next INR check:  12/17/2022  INR from last check:  3.0 (11/19/2022)  Weekly max warfarin dose:    Target end date:    INR check location:    Preferred lab:    Send INR reminders to:     Indications   Pulmonary emboli (Resolved) [I26.99] DVT (deep venous thrombosis) (Resolved) [I82.409] Retinal artery occlusion central [H34.10] Right retinal embolus [H34.9] Long term (current) use of anticoagulants [Z79.01]        Comments:           No Known Allergies  Current Outpatient Medications:    Accu-Chek FastClix Lancets MISC, Use Accu Chek Fastclix lancets to check blood sugar three times daily. DX:E11.65, Disp: 300 each, Rfl: 2   ACCU-CHEK GUIDE test strip, TEST BLOOD SUGAR THREE TIMES DAILY AS DIRECTED, Disp: 300 strip, Rfl: 3   acetaminophen (TYLENOL) 500 MG tablet, Take 1,000 mg by mouth as needed for mild pain or moderate pain., Disp: , Rfl:    alum & mag hydroxide-simeth (MAALOX/MYLANTA) 200-200-20 MG/5ML suspension, Take 15-30 mLs by mouth every 2 (two) hours as needed for indigestion., Disp: 355 mL, Rfl: 0   APPLE CIDER VINEGAR PO, Take 1 tablet by mouth daily., Disp: , Rfl:    ascorbic acid (VITAMIN C) 1000 MG tablet, Take 1 tablet (1,000 mg total) by mouth daily., Disp: , Rfl:    bisacodyl (DULCOLAX) 5 MG EC tablet, Take 1 tablet (5 mg total) by mouth daily as  needed for moderate constipation., Disp: 30 tablet, Rfl: 0   empagliflozin (JARDIANCE) 25 MG TABS tablet, Take 1 tablet (25 mg total) by mouth daily., Disp: 90 tablet, Rfl: 3   fluticasone (FLONASE) 50 MCG/ACT nasal spray, USE 2 SPRAY(S) IN EACH NOSTRIL AT BEDTIME AS NEEDED FOR  SINUS  DRAINAGE (Patient taking differently: Place 1-2 sprays into both nostrils daily.), Disp: 16 g, Rfl: 0   gabapentin (NEURONTIN) 300 MG capsule, TAKE 1 CAPSULE FOUR TIMES DAILY AS NEEDED (Patient taking differently: Take 600 mg by mouth as needed (pain).), Disp: 360 capsule, Rfl: 0   guaiFENesin-dextromethorphan (ROBITUSSIN DM) 100-10 MG/5ML syrup, Take 15 mLs by mouth every 4 (four) hours as needed for cough., Disp: 118 mL, Rfl: 0   HYDROcodone-acetaminophen (NORCO/VICODIN) 5-325 MG tablet, Take 1 tablet by mouth every 6 (six) hours as needed for moderate pain., Disp: 30 tablet, Rfl: 0   insulin aspart (NOVOLOG) 100 UNIT/ML injection, Inject 0-9 Units into the skin 3 (three) times daily with meals. Correction coverage: Sensitive (thin, NPO, renal)  CBG < 70: Implement Hypoglycemia Standing Orders and refer to Hypoglycemia Standing Orders sidebar report  CBG 70 - 120: 0 units  CBG 121 - 150: 1 unit  CBG 151 - 200: 2 units  CBG 201 - 250: 3 units  CBG 251 - 300: 5 units  CBG 301 - 350: 7 units  CBG 351 - 400 9 units  CBG >  400  10 units, Disp: 10 mL, Rfl: 11   Insulin Syringes, Disposable, U-100 0.5 ML MISC, Use to inject insulin, Disp: 100 each, Rfl: 2   magnesium citrate SOLN, Take 296 mLs (1 Bottle total) by mouth once as needed for severe constipation., Disp: 195 mL, Rfl:    metFORMIN (GLUCOPHAGE-XR) 750 MG 24 hr tablet, TAKE 2 TABLETS EVERY DAY, Disp: 180 tablet, Rfl: 3   Multiple Vitamin (MULTIVITAMIN WITH MINERALS) TABS tablet, Take 1 tablet by mouth every morning. Centrum, Disp: , Rfl:    mupirocin ointment (BACTROBAN) 2 %, Place 1 Application into the nose 2 (two) times daily., Disp: 22 g, Rfl: 0   pantoprazole  (PROTONIX) 40 MG tablet, Take 1 tablet (40 mg total) by mouth daily., Disp: , Rfl:    phenol (CHLORASEPTIC) 1.4 % LIQD, Use as directed 1 spray in the mouth or throat as needed for throat irritation / pain., Disp: , Rfl: 0   pioglitazone (ACTOS) 30 MG tablet, TAKE 1 TABLET EVERY DAY, Disp: 90 tablet, Rfl: 3   polyethylene glycol (MIRALAX / GLYCOLAX) 17 g packet, Take 17 g by mouth daily as needed for mild constipation., Disp: 14 each, Rfl: 0   prednisoLONE acetate (PRED FORTE) 1 % ophthalmic suspension, Place 1 drop into the right eye every evening., Disp: , Rfl:    rosuvastatin (CRESTOR) 20 MG tablet, Take 1 tablet (20 mg total) by mouth daily., Disp: 90 tablet, Rfl: 2   senna-docusate (SENOKOT-S) 8.6-50 MG tablet, Take 1 tablet by mouth at bedtime as needed for mild constipation., Disp: , Rfl:    warfarin (COUMADIN) 5 MG tablet, Take 2 (two) of your 5 mg peach-colored warfarin tablets on Mondays, Wednesdays and Fridays. All OTHER DAYS, (Sundays, Tuesdays, Thursdays and Saturdays) take two and one-half ( 2 & 1/2 tablets.), Disp: 68 tablet, Rfl: 2   zinc sulfate 220 (50 Zn) MG capsule, Take 1 capsule (220 mg total) by mouth daily., Disp: , Rfl:  Past Medical History:  Diagnosis Date   Arthritis    bilateral hips   Cutaneous abscess of left foot    Deep vein thrombosis (DVT)    Diabetes mellitus    type II   Diabetic ulcer of heel    Right heel   DJD (degenerative joint disease)    DVT (deep venous thrombosis) 02/08/2014   Proximal provoked. Date of diagnosis February 08 2014 Duration of anticoagulation: 6 months. End date 08/12/2014.  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 Duration of anticoagulation: 8 months. End date 10/11/2014.  Anticoagulant: Lovenox 120 units daily Switched to Eliquis on 05/25/2014 per patient preference    Pulmonary embolism    Sebaceous cyst    on back of neck   Social History   Socioeconomic History   Marital status: Single    Spouse name: Not on file   Number of children: 0   Years of education: Not on file   Highest education level: Not on file  Occupational History   Occupation: disabled  Tobacco Use   Smoking status: Never   Smokeless tobacco: Never  Vaping Use   Vaping  Use: Never used  Substance and Sexual Activity   Alcohol use: Not Currently    Comment: beer and mixed drink maybe 5  times a month   Drug use: No   Sexual activity: Never  Other Topics Concern   Not on file  Social History Narrative   Not on file   Social Determinants of Health   Financial Resource Strain: Low Risk  (12/15/2021)   Overall Financial Resource Strain (CARDIA)    Difficulty of Paying Living Expenses: Not hard at all  Food Insecurity: No Food Insecurity (03/29/2022)   Hunger Vital Sign    Worried About Running Out of Food in the Last Year: Never true    Ran Out of Food in the Last Year: Never true  Transportation Needs: No Transportation Needs (03/29/2022)   PRAPARE - Administrator, Civil Service (Medical): No    Lack of Transportation (Non-Medical): No  Physical Activity: Inactive (12/15/2021)   Exercise Vital Sign    Days of Exercise per Week: 0 days    Minutes of Exercise per Session: 0 min  Stress: No Stress Concern Present (12/15/2021)   [employer] of Occupational Health - Occupational Stress Questionnaire    Feeling of Stress : Not at all  Social Connections: Socially Isolated (12/15/2021)   Social Connection and Isolation Panel [NHANES]    Frequency of Communication with Friends and Family: More than three times a week    Frequency of Social Gatherings with Friends and Family:  More than three times a week    Attends Religious Services: Never    Database administrator or Organizations: No    Attends Engineer, structural: Never    Marital Status: Never married   Family History  Problem Relation Age of Onset   Breast cancer Mother    Cancer Mother        small intestine   Liver cancer Mother    Diabetes Mother    Diabetes Father    Diabetes Brother    Hypertension Maternal Grandmother    Heart Problems Maternal Grandmother    Diabetes Paternal Grandmother    Diabetes Paternal Grandfather    Diabetes Brother     ASSESSMENT Recent Results: The most recent result is correlated with 80 mg per week: Lab Results  Component Value Date   INR 3.0 11/19/2022   INR 1.5 (H) 10/03/2022   INR 1.4 (H) 10/02/2022    Anticoagulation Dosing: Description   Take 2 (two) of your 5 mg peach-colored warfarin tablets, on MONDAYS, TUESDAYS, WEDNESDAYS AND FRIDAYS. ALL other days, take 2 & 1/2 (two-and-one-half) of your 5 mg peach-colored warfarin tablets.      INR today: Therapeutic  PLAN Weekly dose was decreased by 3% to 77.5 mg per week  Patient Instructions  Patient instructed to take medications as defined in the Anti-coagulation Track section of this encounter.  Patient instructed to take today's dose.  Patient instructed to take 2 (two) of your 5 mg peach-colored warfarin tablets, on MONDAYS, TUESDAYS, WEDNESDAYS AND FRIDAYS. ALL other days, take 2 & 1/2 (two-and-one-half) of your 5 mg peach-colored warfarin tablets.  Patient verbalized understanding of these instructions.  Patient advised to contact clinic or seek medical attention if signs/symptoms of bleeding or thromboembolism occur.  Patient verbalized understanding by repeating back information and was advised to contact me if further medication-related questions arise. Patient was also provided an information handout.  Follow-up Return in 4 weeks (on 12/17/2022).  Elicia Lamp,  PharmD,  CPP  15 minutes spent face-to-face with the patient during the encounter. 50% of time spent on education, including signs/sx bleeding and clotting, as well as food and drug interactions with warfarin. 50% of time was spent on fingerprick POC INR sample collection,processing, results determination, and documentation in [email or web address].

## 2022-11-19 NOTE — Patient Instructions (Signed)
Patient instructed to take medications as defined in the Anti-coagulation Track section of this encounter.  Patient instructed to take today's dose.  Patient instructed to take 2 (two) of your 5 mg peach-colored warfarin tablets, on MONDAYS, TUESDAYS, WEDNESDAYS AND FRIDAYS. ALL other days, take 2 & 1/2 (two-and-one-half) of your 5 mg peach-colored warfarin tablets.  Patient verbalized understanding of these instructions.

## 2022-11-20 ENCOUNTER — Ambulatory Visit: Payer: Medicare HMO | Admitting: Adult Health

## 2022-11-20 DIAGNOSIS — E7800 Pure hypercholesterolemia, unspecified: Secondary | ICD-10-CM | POA: Diagnosis not present

## 2022-11-20 DIAGNOSIS — E1142 Type 2 diabetes mellitus with diabetic polyneuropathy: Secondary | ICD-10-CM | POA: Diagnosis not present

## 2022-11-20 DIAGNOSIS — G4733 Obstructive sleep apnea (adult) (pediatric): Secondary | ICD-10-CM | POA: Diagnosis not present

## 2022-11-20 DIAGNOSIS — M160 Bilateral primary osteoarthritis of hip: Secondary | ICD-10-CM | POA: Diagnosis not present

## 2022-11-20 DIAGNOSIS — H3411 Central retinal artery occlusion, right eye: Secondary | ICD-10-CM | POA: Diagnosis not present

## 2022-11-20 DIAGNOSIS — L03119 Cellulitis of unspecified part of limb: Secondary | ICD-10-CM | POA: Diagnosis not present

## 2022-11-20 DIAGNOSIS — K760 Fatty (change of) liver, not elsewhere classified: Secondary | ICD-10-CM | POA: Diagnosis not present

## 2022-11-20 DIAGNOSIS — G629 Polyneuropathy, unspecified: Secondary | ICD-10-CM | POA: Diagnosis not present

## 2022-11-20 DIAGNOSIS — Z4781 Encounter for orthopedic aftercare following surgical amputation: Secondary | ICD-10-CM | POA: Diagnosis not present

## 2022-11-21 ENCOUNTER — Telehealth: Payer: Self-pay | Admitting: Orthopedic Surgery

## 2022-11-21 DIAGNOSIS — G629 Polyneuropathy, unspecified: Secondary | ICD-10-CM | POA: Diagnosis not present

## 2022-11-21 DIAGNOSIS — G4733 Obstructive sleep apnea (adult) (pediatric): Secondary | ICD-10-CM | POA: Diagnosis not present

## 2022-11-21 DIAGNOSIS — E1142 Type 2 diabetes mellitus with diabetic polyneuropathy: Secondary | ICD-10-CM | POA: Diagnosis not present

## 2022-11-21 DIAGNOSIS — L03119 Cellulitis of unspecified part of limb: Secondary | ICD-10-CM | POA: Diagnosis not present

## 2022-11-21 DIAGNOSIS — H3411 Central retinal artery occlusion, right eye: Secondary | ICD-10-CM | POA: Diagnosis not present

## 2022-11-21 DIAGNOSIS — K760 Fatty (change of) liver, not elsewhere classified: Secondary | ICD-10-CM | POA: Diagnosis not present

## 2022-11-21 DIAGNOSIS — E7800 Pure hypercholesterolemia, unspecified: Secondary | ICD-10-CM | POA: Diagnosis not present

## 2022-11-21 DIAGNOSIS — M160 Bilateral primary osteoarthritis of hip: Secondary | ICD-10-CM | POA: Diagnosis not present

## 2022-11-21 DIAGNOSIS — Z4781 Encounter for orthopedic aftercare following surgical amputation: Secondary | ICD-10-CM | POA: Diagnosis not present

## 2022-11-21 NOTE — Telephone Encounter (Signed)
Melissa (OT) from [facility] called requesting recert OT for 1 wk 5. Please call Melissa on secure line or leave vm at  [phone].

## 2022-11-21 NOTE — Telephone Encounter (Signed)
Called and lm on vm to advise verbal ok for orders as below.

## 2022-11-22 ENCOUNTER — Encounter: Payer: Self-pay | Admitting: Orthopedic Surgery

## 2022-11-22 DIAGNOSIS — Z4781 Encounter for orthopedic aftercare following surgical amputation: Secondary | ICD-10-CM | POA: Diagnosis not present

## 2022-11-22 DIAGNOSIS — E1142 Type 2 diabetes mellitus with diabetic polyneuropathy: Secondary | ICD-10-CM | POA: Diagnosis not present

## 2022-11-22 DIAGNOSIS — H3411 Central retinal artery occlusion, right eye: Secondary | ICD-10-CM | POA: Diagnosis not present

## 2022-11-22 DIAGNOSIS — G629 Polyneuropathy, unspecified: Secondary | ICD-10-CM | POA: Diagnosis not present

## 2022-11-22 DIAGNOSIS — M160 Bilateral primary osteoarthritis of hip: Secondary | ICD-10-CM | POA: Diagnosis not present

## 2022-11-22 DIAGNOSIS — E7800 Pure hypercholesterolemia, unspecified: Secondary | ICD-10-CM | POA: Diagnosis not present

## 2022-11-22 DIAGNOSIS — L03119 Cellulitis of unspecified part of limb: Secondary | ICD-10-CM | POA: Diagnosis not present

## 2022-11-22 DIAGNOSIS — G4733 Obstructive sleep apnea (adult) (pediatric): Secondary | ICD-10-CM | POA: Diagnosis not present

## 2022-11-22 DIAGNOSIS — K760 Fatty (change of) liver, not elsewhere classified: Secondary | ICD-10-CM | POA: Diagnosis not present

## 2022-11-22 NOTE — Progress Notes (Signed)
Office Visit Note   Patient: Gregory May           Date of Birth: 1960/10/10           MRN: [id number] Visit Date: 11/19/2022              Requested by: Gwenevere Abbot, MD [address] [place],  [place] [address] PCP: Gwenevere Abbot, MD  Chief Complaint  Patient presents with   Left Leg - Routine Post Op    09/29/22 left BKA      HPI: Patient is a 62 year old gentleman who is status post left transtibial amputation February 17 he has been using Dial soap cleansing dry dressing changes.  Patient states that the residual limb is sore to put pressure on it.  He states the swelling is going down.  Patient states that [facility] is coming out to his house to provide him with prosthetic fitting.  Assessment & Plan: Visit Diagnoses:  1. Left below-knee amputee   2. Below-knee amputation of right lower extremity, initial encounter     Plan: Patient was provided a prescription for Vicodin he was provided a prescription for [facility]ion[facility].  Patient will need a new use socket liner foot and ankle on the right as well as a new liner on the right.  Follow-Up Instructions: Return in about 2 weeks (around 12/03/2022).   Ortho Exam  Patient is alert, oriented, no adenopathy, well-dressed, normal affect, normal respiratory effort. Examination patient has a torn liner for the right transtibial amputation.  His socket is too large she has had decreased residual volume in the foot and ankle are broken.  Patient has callus and eschar debrided from the left transtibial amputation sutures are removed.  There is no cellulitis there is good bleeding there is good granulation tissue.  Patient is an existing right transtibial  amputee.  Patient's current comorbidities are not expected to impact the ability to function with the prescribed prosthesis. Patient verbally communicates a strong desire to use a prosthesis. Patient currently requires mobility aids to ambulate without a prosthesis.  Expects not to  use mobility aids with a new prosthesis.  Patient is a K3 level ambulator that spends a lot of time walking around on uneven terrain over obstacles, up and down stairs, and ambulates with a variable cadence.  Patient is a new left transtibial  amputee.  Patient's current comorbidities are not expected to impact the ability to function with the prescribed prosthesis. Patient verbally communicates a strong desire to use a prosthesis. Patient currently requires mobility aids to ambulate without a prosthesis.  Expects not to use mobility aids with a new prosthesis.  Patient is a K3 level ambulator that spends a lot of time walking around on uneven terrain over obstacles, up and down stairs, and ambulates with a variable cadence.      Imaging: No results found. No images are attached to the encounter.  Labs: Lab Results  Component Value Date   HGBA1C 6.9 (H) 09/24/2022   HGBA1C 7.2 (H) 03/08/2022   HGBA1C 7.2 (H) 11/29/2021   ESRSEDRATE 11 11/15/2021   ESRSEDRATE 25 (H) 08/19/2021   ESRSEDRATE 8 07/15/2019   CRP 0.7 11/17/2021   CRP <0.5 11/15/2021   CRP 1.7 (H) 08/19/2021   REPTSTATUS 10/03/2022 FINAL 09/28/2022   REPTSTATUS 10/03/2022 FINAL 09/28/2022   GRAMSTAIN  08/21/2021    WBC PRESENT,BOTH PMN AND MONONUCLEAR NO ORGANISMS SEEN    CULT  09/28/2022    NO GROWTH 5 DAYS  Performed at [facility], [address] [place], [place] [address]    CULT  09/28/2022    NO GROWTH 5 DAYS Performed at [facility], [address] [place], [place] [address]    [facility] STAPHYLOCOCCUS HOMINIS 08/21/2021     Lab Results  Component Value Date   ALBUMIN 2.8 (L) 09/28/2022   ALBUMIN 4.1 09/24/2022   ALBUMIN 4.2 03/08/2022    Lab Results  Component Value Date   MG 1.9 11/15/2021   Lab Results  Component Value Date   VD25OH 16 (L) 11/05/2013    No results found for: "PREALBUMIN"    Latest Ref Rng & Units 10/03/2022    2:13 AM 10/01/2022    5:02 AM 09/30/2022     4:06 AM  CBC EXTENDED  WBC 4.0 - 10.5 K/uL 12.8  12.6  14.0   RBC 4.22 - 5.81 MIL/uL 4.26  4.25  3.84   Hemoglobin 13.0 - 17.0 g/dL 09.8  11.9  14.7   HCT 39.0 - 52.0 % 39.1  39.4  36.3   Platelets 150 - 400 K/uL 413  369  325      There is no height or weight on file to calculate BMI.  Orders:  No orders of the defined types were placed in this encounter.  Meds ordered this encounter  Medications   HYDROcodone-acetaminophen (NORCO/VICODIN) 5-325 MG tablet    Sig: Take 1 tablet by mouth every 6 (six) hours as needed for moderate pain.    Dispense:  30 tablet    Refill:  0     Procedures: No procedures performed  Clinical Data: No additional findings.  ROS:  All other systems negative, except as noted in the HPI. Review of Systems  Objective: Vital Signs: There were no vitals taken for this visit.  Specialty Comments:  No specialty comments available.  PMFS History: Patient Active Problem List   Diagnosis Date Noted   Recurrent acute deep vein thrombosis (DVT) of lower extremity 10/03/2022   Lower extremity cellulitis 09/29/2022   Acute osteomyelitis of left foot 09/29/2022   PFO with atrial septal aneurysm    Long term (current) use of anticoagulants 02/26/2022   Right retinal embolus 01/15/2022   DM type 2 with diabetic peripheral neuropathy 01/15/2022   Obstructive sleep apnea 01/15/2022   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   Transportation insecurity 11/23/2021   Retinal artery occlusion, central 11/15/2021   Epidermal inclusion cyst 10/18/2021   Amputated finger 09/05/2021   Osteomyelitis of finger of right hand 08/19/2021   Hepatic steatosis 06/24/2020   Back pain 04/20/2020   Left knee pain 04/20/2020   Allergic sinusitis 04/20/2020   Elevated liver enzymes 04/20/2020   Cutaneous abscess of left foot    Diastasis of rectus abdominis 07/16/2019   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 10/07/2018    Essential hypertension 06/19/2018   Peripheral neuropathy 12/19/2017   Left below-knee amputee 04/17/2017   Carpal tunnel syndrome 03/18/2017   Status post below knee amputation of right lower extremity 06/07/2015   Diabetic polyneuropathy associated with type 2 diabetes mellitus 12/09/2014   Diabetes mellitus with carpal tunnel syndrome (HCC) 12/09/2014   DJD (degenerative joint disease) of knee 07/21/2014   Osteoarthritis, hip, bilateral 05/25/2014   Onychomycosis 10/14/2013   Pure hypercholesterolemia 09/25/2013   Poorly controlled type 2 diabetes mellitus with complication 09/15/1997   Past Medical History:  Diagnosis Date   Arthritis    bilateral  hips   Cutaneous abscess of left foot    Deep vein thrombosis (DVT)    Diabetes mellitus    type II   Diabetic ulcer of heel    Right heel   DJD (degenerative joint disease)    DVT (deep venous thrombosis) 02/08/2014   Proximal provoked. Date of diagnosis February 08 2014 Duration of anticoagulation: 6 months. End date 08/12/2014.  Anticoagulant: Lovenox 120 units daily Switched to Eliquis on 05/25/2014      Ear drum perforation, right 03/06/2019   Non-pressure chronic ulcer of right calf, limited to breakdown of skin 04/17/2017   Pulmonary emboli 02/08/2014   Date of diagnosis February 08 2014, on chest CTA Hospitalized for 3 days Had some symptoms of shortness of breath, and chest pain With intercurrent DVT of the left LE. Duration of anticoagulation: 8 months. End date 10/11/2014.  Anticoagulant: Lovenox 120 units daily Switched to Eliquis on 05/25/2014 per patient preference    Pulmonary embolism    Sebaceous cyst    on back of neck    Family History  Problem Relation Age of Onset   Breast cancer Mother    Cancer Mother        small intestine   Liver cancer Mother    Diabetes Mother    Diabetes Father    Diabetes Brother    Hypertension Maternal Grandmother    Heart Problems Maternal Grandmother    Diabetes Paternal Grandmother     Diabetes Paternal Grandfather    Diabetes Brother     Past Surgical History:  Procedure Laterality Date   AMPUTATION Right 06/03/2015   Procedure: Right Below Knee Amputation;  Surgeon: Nadara MustardMarcus Magin Balbi V, MD;  Location: [facility] OR;  Service: Orthopedics;  Laterality: Right;   AMPUTATION Left 07/24/2019   Procedure: LEFT FOOT 3RD RAY AMPUTATION;  Surgeon: Nadara Mustarduda, Nashya Garlington V, MD;  [facility];  Service: Orthopedics;  Laterality: Left;   AMPUTATION Left 06/23/2021   Procedure: LEFT 2ND TOE AMPUTATION;  Surgeon: Nadara Mustarduda, Nobuko Gsell V, MD;  [facility];  Service: Orthopedics;  Laterality: Left;   AMPUTATION Right 08/21/2021   Procedure: AMPUTATION RIGHT INDEX FINGER;  Surgeon: Gomez CleverlySpears, James, MD;  Location: [facility] OR;  Service: Orthopedics;  Laterality: Right;   AMPUTATION Left 09/29/2022   Procedure: AMPUTATION BELOW KNEE;  Surgeon: Nadara Mustarduda, Howell Groesbeck V, MD;  [facility];  Service: Orthopedics;  Laterality: Left;   BUBBLE STUDY  01/24/2022   Procedure: BUBBLE STUDY;  Surgeon: Wendall StadeNishan, Peter C, MD;  [facility];  Service: Cardiovascular;;   CLOSED REDUCTION WITH HUMER PIN INSERTION  1974   left hip   HARDWARE REMOVAL Left 07/21/2014   Procedure: HARDWARE REMOVAL;  Surgeon: Loreta Aveaniel F Murphy, MD;  [facility];  Service: Orthopedics;  Laterality: Left;   I & D EXTREMITY Right 08/21/2021   Procedure: IRRIGATION AND DEBRIDEMENT RIGHT INDEX FINGER;  Surgeon: Gomez CleverlySpears, James, MD;  Location: [facility] OR;  Service: Orthopedics;  Laterality: Right;   PATENT FORAMEN OVALE(PFO) CLOSURE N/A 02/28/2022   Procedure: PATENT FORAMEN OVALE(PFO) CLOSURE;  Surgeon: Tonny Bollmanooper, Michael, MD;  [facility];  Service: Cardiovascular;  Laterality: N/A;   ROTATOR CUFF REPAIR Right 2005 (approx)   TEE WITHOUT CARDIOVERSION N/A 01/24/2022   Procedure: TRANSESOPHAGEAL ECHOCARDIOGRAM (TEE);  Surgeon: Wendall StadeNishan, Peter C, MD;  [facility];  Service: Cardiovascular;  Laterality: N/A;   TOTAL HIP ARTHROPLASTY Left 07/21/2014    Procedure: TOTAL HIP ARTHROPLASTY ANTERIOR APPROACH;  Surgeon: Loreta Aveaniel F Murphy, MD;  Location:  [facility] OR;  Service: Orthopedics;  Laterality: Left;   TOTAL HIP ARTHROPLASTY Right 2006 (approx)   right hip replaced   Social History   Occupational History   Occupation: disabled  Tobacco Use   Smoking status: Never   Smokeless tobacco: Never  Vaping Use   Vaping Use: Never used  Substance and Sexual Activity   Alcohol use: Not Currently    Comment: beer and mixed drink maybe 5  times a month   Drug use: No   Sexual activity: Never

## 2022-11-26 ENCOUNTER — Ambulatory Visit: Payer: Medicare HMO

## 2022-11-26 ENCOUNTER — Ambulatory Visit (INDEPENDENT_AMBULATORY_CARE_PROVIDER_SITE_OTHER): Payer: Medicare HMO

## 2022-11-26 VITALS — BP 127/90 | HR 98 | Temp 97.8°F | Ht 72.0 in | Wt 181.9 lb

## 2022-11-26 DIAGNOSIS — I10 Essential (primary) hypertension: Secondary | ICD-10-CM

## 2022-11-26 DIAGNOSIS — Z7984 Long term (current) use of oral hypoglycemic drugs: Secondary | ICD-10-CM | POA: Diagnosis not present

## 2022-11-26 DIAGNOSIS — Z89511 Acquired absence of right leg below knee: Secondary | ICD-10-CM | POA: Diagnosis not present

## 2022-11-26 DIAGNOSIS — Z89512 Acquired absence of left leg below knee: Secondary | ICD-10-CM | POA: Diagnosis not present

## 2022-11-26 DIAGNOSIS — Z794 Long term (current) use of insulin: Secondary | ICD-10-CM | POA: Diagnosis not present

## 2022-11-26 DIAGNOSIS — E118 Type 2 diabetes mellitus with unspecified complications: Secondary | ICD-10-CM

## 2022-11-26 DIAGNOSIS — Z0000 Encounter for general adult medical examination without abnormal findings: Secondary | ICD-10-CM

## 2022-11-26 DIAGNOSIS — H3411 Central retinal artery occlusion, right eye: Secondary | ICD-10-CM | POA: Diagnosis not present

## 2022-11-26 DIAGNOSIS — E1165 Type 2 diabetes mellitus with hyperglycemia: Secondary | ICD-10-CM

## 2022-11-26 NOTE — Assessment & Plan Note (Signed)
Patient currently on warfarin. Saw Dr. Alexandria Lodge recently, INR 3.0. Still very little vision in right eye

## 2022-11-26 NOTE — Assessment & Plan Note (Signed)
BP is good today, no antihpertensive therapy. No symptoms.

## 2022-11-26 NOTE — Patient Instructions (Addendum)
Gregory May, it was a pleasure seeing you today! You endorsed feeling well today. Below are some of the things we talked about this visit. We look forward to seeing you in the follow up appointment!  Today we discussed: Keep following up with Dr. Lajoyce Corners for your left leg. I'm happy to hear you will be getting another prosthetic soon and can start moving around more. I would definitely recommend moving as much as you can.  Diabetes: We'll check your A1c next month. Keep taking the meds you are now.   Blood pressure: Your blood pressure looks good in the clinic. Make sure you're drinking plenty of water.  Continue taking warfarin and follow up with Dr. Alexandria Lodge   Get the shingles vaccine at your pharmacy  I have ordered the following labs today:  Lab Orders  No laboratory test(s) ordered today      Referrals ordered today:   Referral Orders  No referral(s) requested today     I have ordered the following medication/changed the following medications:   Stop the following medications: There are no discontinued medications.   Start the following medications: No orders of the defined types were placed in this encounter.    Follow-up:  4 weeks a1c    Please make sure to arrive 15 minutes prior to your next appointment. If you arrive late, you may be asked to reschedule.   We look forward to seeing you next time. Please call our clinic at [phone] if you have any questions or concerns. The best time to call is Monday-Friday from 9am-4pm, but there is someone available 24/7. If after hours or the weekend, call the main hospital number and ask for the Internal Medicine Resident On-Call. If you need medication refills, please notify your pharmacy one week in advance and they will send [place] a request.  Thank you for letting us take part in your care. Wishing you the best!  Thank you, Lyndle Herrlich MD

## 2022-11-26 NOTE — Assessment & Plan Note (Signed)
A1c 6.9 in February. Metformin 750, jardiance 25, pioglitazone 30, also got novolog recently.

## 2022-11-27 NOTE — Assessment & Plan Note (Addendum)
New L TTA by Dr.Duda, following with him for wound checks and mobility assistance. Well bandaged and recovering. No significant surrounding erythema, edema or discharge from wound.

## 2022-11-27 NOTE — Progress Notes (Signed)
   CC: routine follow up  HPI:  [name] is a 62 y.o.-year-old male with past medical history as below presenting for routine follow up.  Please see encounters tab for problem-based charting.  Past Medical History:  Diagnosis Date   Arthritis    bilateral hips   Cutaneous abscess of left foot    Deep vein thrombosis (DVT)    Diabetes mellitus    type II   Diabetic ulcer of heel    Right heel   DJD (degenerative joint disease)    DVT (deep venous thrombosis) 02/08/2014   Proximal provoked. Date of diagnosis February 08 2014 Duration of anticoagulation: 6 months. End date 08/12/2014.  Anticoagulant: Lovenox 120 units daily Switched to Eliquis on 05/25/2014      Ear drum perforation, right 03/06/2019   Non-pressure chronic ulcer of right calf, limited to breakdown of skin 04/17/2017   Pulmonary emboli 02/08/2014   Date of diagnosis February 08 2014, on chest CTA Hospitalized for 3 days Had some symptoms of shortness of breath, and chest pain With intercurrent DVT of the left LE. Duration of anticoagulation: 8 months. End date 10/11/2014.  Anticoagulant: Lovenox 120 units daily Switched to Eliquis on 05/25/2014 per patient preference    Pulmonary embolism    Sebaceous cyst    on back of neck   Review of Systems: As in HPI.  Please see encounters tab for problem based charting.  Physical Exam:  Vitals:   11/26/22 1456  BP: (!) 127/90  Pulse: 98  Temp: 97.8 F (36.6 C)  TempSrc: Oral  SpO2: 95%  Weight: 181 lb 14.4 oz (82.5 kg)  Height: 6' (1.829 m)   General:pleasant, In NAD, in wheelchair Cardiac: RRR, no murmurs rubs or gallops. Respiratory: Normal work of breathing on room air, CTAB Abdominal: Soft, nontender, nondistended Extremities: R BKA with prosthetic in place. L TTA with clean dressings, no surrouding erythema warmth, edema or drainage  Assessment & Plan:   Essential hypertension BP is good today, no antihpertensive therapy. No symptoms.  Retinal artery  occlusion, central Patient currently on warfarin. Saw Dr. Alexandria May recently, INR 3.0. Still cannot see out of right eye but no new changes.  Type 2 diabetes with complication A1c 6.9 in February. Metformin 750, jardiance 25 (has 1 year supply through manufacturer), pioglitazone 30, also got novolog supply recently and is taking sliding scale TID with meals. No new symptoms.   Status post below knee amputation of right lower extremity (HCC) Working on getting new prosthetic for RLE, otherwise no new changes.  Left below-knee amputee (HCC) New L TTA by Dr.Duda, following with him for wound checks and mobility assistance. Well bandaged and recovering. No significant surrounding erythema, edema or discharge from wound.  Healthcare maintenance Instructed to get shingles vaccine at pharmacy   Patient discussed with Dr. Mayford May

## 2022-11-27 NOTE — Assessment & Plan Note (Signed)
Instructed to get shingles vaccine at pharmacy

## 2022-11-27 NOTE — Assessment & Plan Note (Signed)
A1c 6.9 in February. Metformin 750, jardiance 25 (has 1 year supply through manufacturer), pioglitazone 30, also got novolog supply recently and is taking sliding scale TID with meals. No new symptoms.

## 2022-11-27 NOTE — Assessment & Plan Note (Signed)
Working on getting new prosthetic for RLE, otherwise no new changes.

## 2022-11-28 ENCOUNTER — Telehealth: Payer: Self-pay | Admitting: Orthopedic Surgery

## 2022-11-28 NOTE — Telephone Encounter (Signed)
Patient asking about the paperwork that needs to be sent to [facility], he is advising it has no been received by [employer]. Please advise patient

## 2022-11-28 NOTE — Telephone Encounter (Signed)
Patient informed that form from [employer] was faxed this morning to Cecille Po at [employer].

## 2022-11-29 ENCOUNTER — Telehealth: Payer: Self-pay | Admitting: Orthopedic Surgery

## 2022-11-29 NOTE — Telephone Encounter (Signed)
Received vm from [facility] requesting 11/19/22 progress note. She stated their fax server has been down and they haven't been receiving any faxes. I faxed 4/8 note routed via Epic fax function. Fax [phone], ph [phone]

## 2022-11-30 ENCOUNTER — Other Ambulatory Visit: Payer: Self-pay

## 2022-11-30 ENCOUNTER — Telehealth: Payer: Self-pay | Admitting: Orthopedic Surgery

## 2022-11-30 DIAGNOSIS — G4733 Obstructive sleep apnea (adult) (pediatric): Secondary | ICD-10-CM | POA: Diagnosis not present

## 2022-11-30 DIAGNOSIS — Z4781 Encounter for orthopedic aftercare following surgical amputation: Secondary | ICD-10-CM | POA: Diagnosis not present

## 2022-11-30 DIAGNOSIS — M160 Bilateral primary osteoarthritis of hip: Secondary | ICD-10-CM | POA: Diagnosis not present

## 2022-11-30 DIAGNOSIS — E1142 Type 2 diabetes mellitus with diabetic polyneuropathy: Secondary | ICD-10-CM | POA: Diagnosis not present

## 2022-11-30 DIAGNOSIS — L03119 Cellulitis of unspecified part of limb: Secondary | ICD-10-CM | POA: Diagnosis not present

## 2022-11-30 DIAGNOSIS — H3411 Central retinal artery occlusion, right eye: Secondary | ICD-10-CM | POA: Diagnosis not present

## 2022-11-30 DIAGNOSIS — G629 Polyneuropathy, unspecified: Secondary | ICD-10-CM | POA: Diagnosis not present

## 2022-11-30 DIAGNOSIS — E7800 Pure hypercholesterolemia, unspecified: Secondary | ICD-10-CM | POA: Diagnosis not present

## 2022-11-30 DIAGNOSIS — K760 Fatty (change of) liver, not elsewhere classified: Secondary | ICD-10-CM | POA: Diagnosis not present

## 2022-11-30 NOTE — Telephone Encounter (Signed)
[facility], [facility] called. Says the wound is redder than normal, not warm. Would like verbal orders to apply medihoney or calicum alginhee. Her call back number is [phone]

## 2022-11-30 NOTE — Progress Notes (Signed)
Internal Medicine Clinic Attending  Case discussed with Dr. Sridharan  At the time of the visit.  We reviewed the resident's history and exam and pertinent patient test results.  I agree with the assessment, diagnosis, and plan of care documented in the resident's note.  

## 2022-11-30 NOTE — Telephone Encounter (Signed)
I called and sw nurse the pt has an appt on Monday and she was worried that it does not"look great" she said it needs more that a dry dressing and that she was not sure if the leg had been wrapped to tight and that is why that it was red but she wanted to give [place] a heads up and let us know prior to his appt. I called pt and he sates that he looks " 100 times better" he said that he will treat with a little triple abx ointment and change daily and wear shrinker and keep his appt on Monday.

## 2022-12-03 ENCOUNTER — Ambulatory Visit (INDEPENDENT_AMBULATORY_CARE_PROVIDER_SITE_OTHER): Payer: Medicare HMO | Admitting: Orthopedic Surgery

## 2022-12-03 DIAGNOSIS — Z89512 Acquired absence of left leg below knee: Secondary | ICD-10-CM

## 2022-12-03 MED ORDER — HYDROCODONE-ACETAMINOPHEN 5-325 MG PO TABS: 1.0000 | ORAL_TABLET | Freq: Four times a day (QID) | ORAL | 0 refills | Status: DC | PRN

## 2022-12-03 MED ORDER — AMOXICILLIN-POT CLAVULANATE 875-125 MG PO TABS: 1.0000 | ORAL_TABLET | Freq: Two times a day (BID) | ORAL | 0 refills | Status: DC

## 2022-12-04 ENCOUNTER — Encounter: Payer: Self-pay | Admitting: Orthopedic Surgery

## 2022-12-04 DIAGNOSIS — E7800 Pure hypercholesterolemia, unspecified: Secondary | ICD-10-CM | POA: Diagnosis not present

## 2022-12-04 DIAGNOSIS — G629 Polyneuropathy, unspecified: Secondary | ICD-10-CM | POA: Diagnosis not present

## 2022-12-04 DIAGNOSIS — G4733 Obstructive sleep apnea (adult) (pediatric): Secondary | ICD-10-CM | POA: Diagnosis not present

## 2022-12-04 DIAGNOSIS — L03119 Cellulitis of unspecified part of limb: Secondary | ICD-10-CM | POA: Diagnosis not present

## 2022-12-04 DIAGNOSIS — Z4781 Encounter for orthopedic aftercare following surgical amputation: Secondary | ICD-10-CM | POA: Diagnosis not present

## 2022-12-04 DIAGNOSIS — K760 Fatty (change of) liver, not elsewhere classified: Secondary | ICD-10-CM | POA: Diagnosis not present

## 2022-12-04 DIAGNOSIS — H3411 Central retinal artery occlusion, right eye: Secondary | ICD-10-CM | POA: Diagnosis not present

## 2022-12-04 DIAGNOSIS — E1142 Type 2 diabetes mellitus with diabetic polyneuropathy: Secondary | ICD-10-CM | POA: Diagnosis not present

## 2022-12-04 DIAGNOSIS — M160 Bilateral primary osteoarthritis of hip: Secondary | ICD-10-CM | POA: Diagnosis not present

## 2022-12-04 NOTE — Progress Notes (Signed)
Office Visit Note   Patient: Gregory May           Date of Birth: May 09, 1961           MRN: [id number] Visit Date: 12/03/2022              Requested by: Gwenevere Abbot, MD [address] [place],  [place] [address] PCP: Gwenevere Abbot, MD  Chief Complaint  Patient presents with   Left Leg - Routine Post Op    09/29/22 left BKA      HPI: Patient is a 62 year old gentleman who presents 2 months status post left below-knee amputation.  Patient states he has pain that comes and goes he describes this as a burning sensation at night and in the morning.  He is currently off antibiotics.  Assessment & Plan: Visit Diagnoses:  1. Left below-knee amputee     Plan: Prescription is called in for Augmentin.  He is on Coumadin and cannot use Bactrim DS.  A prescription also called in for Vicodin.  Follow-Up Instructions: Return in about 2 weeks (around 12/17/2022).   Ortho Exam  Patient is alert, oriented, no adenopathy, well-dressed, normal affect, normal respiratory effort. Examination the lateral wound has healthy granulation tissue there is cellulitis of the residual wound but no purulent drainage.  Imaging: No results found. No images are attached to the encounter.  Labs: Lab Results  Component Value Date   HGBA1C 6.9 (H) 09/24/2022   HGBA1C 7.2 (H) 03/08/2022   HGBA1C 7.2 (H) 11/29/2021   ESRSEDRATE 11 11/15/2021   ESRSEDRATE 25 (H) 08/19/2021   ESRSEDRATE 8 07/15/2019   CRP 0.7 11/17/2021   CRP <0.5 11/15/2021   CRP 1.7 (H) 08/19/2021   REPTSTATUS 10/03/2022 FINAL 09/28/2022   REPTSTATUS 10/03/2022 FINAL 09/28/2022   GRAMSTAIN  08/21/2021    WBC PRESENT,BOTH PMN AND MONONUCLEAR NO ORGANISMS SEEN    CULT  09/28/2022    NO GROWTH 5 DAYS Performed at [facility], [address] [place], [place] [address]    CULT  09/28/2022    NO GROWTH 5 DAYS Performed at [facility], [address] [place], [place] [address]    [facility] STAPHYLOCOCCUS  HOMINIS 08/21/2021     Lab Results  Component Value Date   ALBUMIN 2.8 (L) 09/28/2022   ALBUMIN 4.1 09/24/2022   ALBUMIN 4.2 03/08/2022    Lab Results  Component Value Date   MG 1.9 11/15/2021   Lab Results  Component Value Date   VD25OH 16 (L) 11/05/2013    No results found for: "PREALBUMIN"    Latest Ref Rng & Units 10/03/2022    2:13 AM 10/01/2022    5:02 AM 09/30/2022    4:06 AM  CBC EXTENDED  WBC 4.0 - 10.5 K/uL 12.8  12.6  14.0   RBC 4.22 - 5.81 MIL/uL 4.26  4.25  3.84   Hemoglobin 13.0 - 17.0 g/dL 21.3  08.6  57.8   HCT 39.0 - 52.0 % 39.1  39.4  36.3   Platelets 150 - 400 K/uL 413  369  325      There is no height or weight on file to calculate BMI.  Orders:  No orders of the defined types were placed in this encounter.  Meds ordered this encounter  Medications   HYDROcodone-acetaminophen (NORCO/VICODIN) 5-325 MG tablet    Sig: Take 1 tablet by mouth every 6 (six) hours as needed for moderate pain.    Dispense:  30 tablet    Refill:  0   amoxicillin-clavulanate (AUGMENTIN) 875-125 MG tablet    Sig: Take 1 tablet by mouth 2 (two) times daily.    Dispense:  30 tablet    Refill:  0     Procedures: No procedures performed  Clinical Data: No additional findings.  ROS:  All other systems negative, except as noted in the HPI. Review of Systems  Objective: Vital Signs: There were no vitals taken for this visit.  Specialty Comments:  No specialty comments available.  PMFS History: Patient Active Problem List   Diagnosis Date Noted   Recurrent acute deep vein thrombosis (DVT) of lower extremity 10/03/2022   Lower extremity cellulitis 09/29/2022   Acute osteomyelitis of left foot 09/29/2022   PFO with atrial septal aneurysm    Long term (current) use of anticoagulants 02/26/2022   Right retinal embolus 01/15/2022   DM type 2 with diabetic peripheral neuropathy 01/15/2022   Obstructive sleep apnea 01/15/2022   History of right MCA stroke  11/23/2021   Transportation insecurity 11/23/2021   Retinal artery occlusion, central 11/15/2021   Epidermal inclusion cyst 10/18/2021   Amputated finger 09/05/2021   Osteomyelitis of finger of right hand 08/19/2021   Hepatic steatosis 06/24/2020   Back pain 04/20/2020   Left knee pain 04/20/2020   Allergic sinusitis 04/20/2020   Elevated liver enzymes 04/20/2020   Cutaneous abscess of left foot    Diastasis of rectus abdominis 07/16/2019   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 10/07/2018   Essential hypertension 06/19/2018   Peripheral neuropathy 12/19/2017   Left below-knee amputee 04/17/2017   Carpal tunnel syndrome 03/18/2017   Healthcare maintenance 06/21/2015   Status post below knee amputation of right lower extremity 06/07/2015   Diabetes mellitus with carpal tunnel syndrome (HCC) 12/09/2014   DJD (degenerative joint disease) of knee 07/21/2014   Osteoarthritis, hip, bilateral 05/25/2014   Onychomycosis 10/14/2013   Pure hypercholesterolemia 09/25/2013   Type 2 diabetes with complication 09/15/1997   Past Medical History:  Diagnosis Date   Arthritis    bilateral hips   Cutaneous abscess of left foot    Deep vein thrombosis (DVT)    Diabetes mellitus    type II   Diabetic ulcer of heel    Right heel   DJD (degenerative joint disease)    DVT (deep venous thrombosis) 02/08/2014   Proximal provoked. Date of diagnosis February 08 2014 Duration of anticoagulation: 6 months. End date 08/12/2014.  Anticoagulant: Lovenox 120 units daily Switched to Eliquis on 05/25/2014      Ear drum perforation, right 03/06/2019   Non-pressure chronic ulcer of right calf, limited to breakdown of skin 04/17/2017   Pulmonary emboli 02/08/2014   Date of diagnosis February 08 2014, on chest CTA Hospitalized for 3 days Had some symptoms of shortness of breath, and chest pain With intercurrent DVT of the left LE. Duration of anticoagulation: 8 months. End date  10/11/2014.  Anticoagulant: Lovenox 120 units daily Switched to Eliquis on 05/25/2014 per patient preference    Pulmonary embolism    Sebaceous cyst    on back of neck    Family History  Problem Relation Age of Onset   Breast cancer Mother    Cancer Mother        small intestine   Liver cancer Mother    Diabetes Mother    Diabetes Father    Diabetes Brother    Hypertension Maternal Grandmother    Heart  Problems Maternal Grandmother    Diabetes Paternal Grandmother    Diabetes Paternal Grandfather    Diabetes Brother     Past Surgical History:  Procedure Laterality Date   AMPUTATION Right 06/03/2015   Procedure: Right Below Knee Amputation;  Surgeon: Nadara Mustard, MD;  Location: [facility] OR;  Service: Orthopedics;  Laterality: Right;   AMPUTATION Left 07/24/2019   Procedure: LEFT FOOT 3RD RAY AMPUTATION;  Surgeon: Nadara Mustard, MD;  Location: [facility] OR;  Service: Orthopedics;  Laterality: Left;   AMPUTATION Left 06/23/2021   Procedure: LEFT 2ND TOE AMPUTATION;  Surgeon: Nadara Mustard, MD;  Location: [facility] OR;  Service: Orthopedics;  Laterality: Left;   AMPUTATION Right 08/21/2021   Procedure: AMPUTATION RIGHT INDEX FINGER;  Surgeon: Gomez Cleverly, MD;  Location: [facility] OR;  Service: Orthopedics;  Laterality: Right;   AMPUTATION Left 09/29/2022   Procedure: AMPUTATION BELOW KNEE;  Surgeon: Nadara Mustard, MD;  Location: [facility] OR;  Service: Orthopedics;  Laterality: Left;   BUBBLE STUDY  01/24/2022   Procedure: BUBBLE STUDY;  Surgeon: Wendall Stade, MD;  Location: [facility] ENDOSCOPY;  Service: Cardiovascular;;   CLOSED REDUCTION WITH HUMER PIN INSERTION  1974   left hip   HARDWARE REMOVAL Left 07/21/2014   Procedure: HARDWARE REMOVAL;  Surgeon: Loreta Ave, MD;  Location: [facility] OR;  Service: Orthopedics;  Laterality: Left;   I & D EXTREMITY Right 08/21/2021   Procedure: IRRIGATION AND DEBRIDEMENT RIGHT INDEX FINGER;  Surgeon: Gomez Cleverly, MD;  Location: [facility] OR;  Service: Orthopedics;  Laterality: Right;    PATENT FORAMEN OVALE(PFO) CLOSURE N/A 02/28/2022   Procedure: PATENT FORAMEN OVALE(PFO) CLOSURE;  Surgeon: Tonny Bollman, MD;  Location: [facility] INVASIVE CV LAB;  Service: Cardiovascular;  Laterality: N/A;   ROTATOR CUFF REPAIR Right 2005 (approx)   TEE WITHOUT CARDIOVERSION N/A 01/24/2022   Procedure: TRANSESOPHAGEAL ECHOCARDIOGRAM (TEE);  Surgeon: Wendall Stade, MD;  Location: [facility] ENDOSCOPY;  Service: Cardiovascular;  Laterality: N/A;   TOTAL HIP ARTHROPLASTY Left 07/21/2014   Procedure: TOTAL HIP ARTHROPLASTY ANTERIOR APPROACH;  Surgeon: Loreta Ave, MD;  Location: [facility] OR;  Service: Orthopedics;  Laterality: Left;   TOTAL HIP ARTHROPLASTY Right 2006 (approx)   right hip replaced   Social History   Occupational History   Occupation: disabled  Tobacco Use   Smoking status: Never   Smokeless tobacco: Never  Vaping Use   Vaping Use: Never used  Substance and Sexual Activity   Alcohol use: Not Currently    Comment: beer and mixed drink maybe 5  times a month   Drug use: No   Sexual activity: Never

## 2022-12-05 DIAGNOSIS — E7800 Pure hypercholesterolemia, unspecified: Secondary | ICD-10-CM | POA: Diagnosis not present

## 2022-12-05 DIAGNOSIS — M160 Bilateral primary osteoarthritis of hip: Secondary | ICD-10-CM | POA: Diagnosis not present

## 2022-12-05 DIAGNOSIS — G4733 Obstructive sleep apnea (adult) (pediatric): Secondary | ICD-10-CM | POA: Diagnosis not present

## 2022-12-05 DIAGNOSIS — Z4781 Encounter for orthopedic aftercare following surgical amputation: Secondary | ICD-10-CM | POA: Diagnosis not present

## 2022-12-05 DIAGNOSIS — K760 Fatty (change of) liver, not elsewhere classified: Secondary | ICD-10-CM | POA: Diagnosis not present

## 2022-12-05 DIAGNOSIS — E1142 Type 2 diabetes mellitus with diabetic polyneuropathy: Secondary | ICD-10-CM | POA: Diagnosis not present

## 2022-12-05 DIAGNOSIS — L03119 Cellulitis of unspecified part of limb: Secondary | ICD-10-CM | POA: Diagnosis not present

## 2022-12-05 DIAGNOSIS — G629 Polyneuropathy, unspecified: Secondary | ICD-10-CM | POA: Diagnosis not present

## 2022-12-05 DIAGNOSIS — H3411 Central retinal artery occlusion, right eye: Secondary | ICD-10-CM | POA: Diagnosis not present

## 2022-12-10 ENCOUNTER — Telehealth: Payer: Self-pay | Admitting: Orthopedic Surgery

## 2022-12-10 DIAGNOSIS — H3411 Central retinal artery occlusion, right eye: Secondary | ICD-10-CM | POA: Diagnosis not present

## 2022-12-10 DIAGNOSIS — E1142 Type 2 diabetes mellitus with diabetic polyneuropathy: Secondary | ICD-10-CM | POA: Diagnosis not present

## 2022-12-10 DIAGNOSIS — G629 Polyneuropathy, unspecified: Secondary | ICD-10-CM | POA: Diagnosis not present

## 2022-12-10 DIAGNOSIS — L03119 Cellulitis of unspecified part of limb: Secondary | ICD-10-CM | POA: Diagnosis not present

## 2022-12-10 DIAGNOSIS — E7800 Pure hypercholesterolemia, unspecified: Secondary | ICD-10-CM | POA: Diagnosis not present

## 2022-12-10 DIAGNOSIS — Z4781 Encounter for orthopedic aftercare following surgical amputation: Secondary | ICD-10-CM | POA: Diagnosis not present

## 2022-12-10 DIAGNOSIS — K760 Fatty (change of) liver, not elsewhere classified: Secondary | ICD-10-CM | POA: Diagnosis not present

## 2022-12-10 DIAGNOSIS — M160 Bilateral primary osteoarthritis of hip: Secondary | ICD-10-CM | POA: Diagnosis not present

## 2022-12-10 DIAGNOSIS — G4733 Obstructive sleep apnea (adult) (pediatric): Secondary | ICD-10-CM | POA: Diagnosis not present

## 2022-12-10 NOTE — Telephone Encounter (Signed)
Pt called requesting medical advice about his wound. Please call pt at [phone].

## 2022-12-10 NOTE — Telephone Encounter (Signed)
I SW pt, he says that he has noticed some green drainage on his ABD pad when he changes out his dressings. He wanted to know if he should be seen sooner than Monday next week. I offered him appts this week On Wednesday with Denny Peon and he said that he will just wait until Monday. I advised to him if he has any increased pain above his baseline, fever, chills, severe redness and increased drainage to call us immediately and we will get him in to see Denny Peon or another provider in office. He is currently on Augmentin per Dr. Audrie Lia last note.

## 2022-12-11 ENCOUNTER — Telehealth: Payer: Self-pay | Admitting: Orthopedic Surgery

## 2022-12-11 ENCOUNTER — Telehealth: Payer: Self-pay | Admitting: Family

## 2022-12-11 ENCOUNTER — Other Ambulatory Visit: Payer: Self-pay | Admitting: Family

## 2022-12-11 ENCOUNTER — Telehealth: Payer: Self-pay

## 2022-12-11 DIAGNOSIS — K760 Fatty (change of) liver, not elsewhere classified: Secondary | ICD-10-CM | POA: Diagnosis not present

## 2022-12-11 DIAGNOSIS — Z4781 Encounter for orthopedic aftercare following surgical amputation: Secondary | ICD-10-CM | POA: Diagnosis not present

## 2022-12-11 DIAGNOSIS — L03119 Cellulitis of unspecified part of limb: Secondary | ICD-10-CM | POA: Diagnosis not present

## 2022-12-11 DIAGNOSIS — E1142 Type 2 diabetes mellitus with diabetic polyneuropathy: Secondary | ICD-10-CM | POA: Diagnosis not present

## 2022-12-11 DIAGNOSIS — M160 Bilateral primary osteoarthritis of hip: Secondary | ICD-10-CM | POA: Diagnosis not present

## 2022-12-11 DIAGNOSIS — G4733 Obstructive sleep apnea (adult) (pediatric): Secondary | ICD-10-CM | POA: Diagnosis not present

## 2022-12-11 DIAGNOSIS — H3411 Central retinal artery occlusion, right eye: Secondary | ICD-10-CM | POA: Diagnosis not present

## 2022-12-11 DIAGNOSIS — E7800 Pure hypercholesterolemia, unspecified: Secondary | ICD-10-CM | POA: Diagnosis not present

## 2022-12-11 DIAGNOSIS — G629 Polyneuropathy, unspecified: Secondary | ICD-10-CM | POA: Diagnosis not present

## 2022-12-11 MED ORDER — SULFAMETHOXAZOLE-TRIMETHOPRIM 800-160 MG PO TABS: 1.0000 | ORAL_TABLET | Freq: Two times a day (BID) | ORAL | 0 refills | Status: DC

## 2022-12-11 NOTE — Telephone Encounter (Signed)
She is aware that we called something in for him this morning, potential medication interaction, waiting for [facility] tomorrow morning

## 2022-12-11 NOTE — Telephone Encounter (Signed)
[facility] call [facility] at [phone] Mans)

## 2022-12-11 NOTE — Telephone Encounter (Signed)
[facility] pharmacy called concerning a drug interaction with Bactrim and Warfarin, which is being prescribed by another provider.  Cb# [phone].  Please advise.  Thank you.

## 2022-12-11 NOTE — Telephone Encounter (Signed)
LMOM for Sonja to call back about patient, if patient needed to be seen he could see Denny Peon tomorrow

## 2022-12-11 NOTE — Telephone Encounter (Signed)
Sonya (RN) from [facility] called requesting a call back. She states [place] W call back but she was unable to answer. Lamar Laundry states she seen pt this morning and pt is on antibiotics but she thinks the infection in the wound the antibiotics are not helping. Please call Sonya at [phone].

## 2022-12-11 NOTE — Telephone Encounter (Signed)
Patient has drainage coming from his wound.Gregory May

## 2022-12-12 DIAGNOSIS — E7800 Pure hypercholesterolemia, unspecified: Secondary | ICD-10-CM | POA: Diagnosis not present

## 2022-12-12 DIAGNOSIS — E1142 Type 2 diabetes mellitus with diabetic polyneuropathy: Secondary | ICD-10-CM | POA: Diagnosis not present

## 2022-12-12 DIAGNOSIS — G4733 Obstructive sleep apnea (adult) (pediatric): Secondary | ICD-10-CM | POA: Diagnosis not present

## 2022-12-12 DIAGNOSIS — Z4781 Encounter for orthopedic aftercare following surgical amputation: Secondary | ICD-10-CM | POA: Diagnosis not present

## 2022-12-12 DIAGNOSIS — G629 Polyneuropathy, unspecified: Secondary | ICD-10-CM | POA: Diagnosis not present

## 2022-12-12 DIAGNOSIS — L03119 Cellulitis of unspecified part of limb: Secondary | ICD-10-CM | POA: Diagnosis not present

## 2022-12-12 DIAGNOSIS — K760 Fatty (change of) liver, not elsewhere classified: Secondary | ICD-10-CM | POA: Diagnosis not present

## 2022-12-12 DIAGNOSIS — M160 Bilateral primary osteoarthritis of hip: Secondary | ICD-10-CM | POA: Diagnosis not present

## 2022-12-12 DIAGNOSIS — H3411 Central retinal artery occlusion, right eye: Secondary | ICD-10-CM | POA: Diagnosis not present

## 2022-12-12 MED ORDER — CEPHALEXIN 500 MG PO CAPS: 500.0000 mg | ORAL_CAPSULE | Freq: Three times a day (TID) | ORAL | 0 refills | Status: DC

## 2022-12-12 NOTE — Addendum Note (Signed)
Addended by: Adonis Huguenin on: 12/12/2022 08:42 AM   Modules accepted: Orders

## 2022-12-14 DIAGNOSIS — E7800 Pure hypercholesterolemia, unspecified: Secondary | ICD-10-CM | POA: Diagnosis not present

## 2022-12-14 DIAGNOSIS — H3411 Central retinal artery occlusion, right eye: Secondary | ICD-10-CM | POA: Diagnosis not present

## 2022-12-14 DIAGNOSIS — G629 Polyneuropathy, unspecified: Secondary | ICD-10-CM | POA: Diagnosis not present

## 2022-12-14 DIAGNOSIS — E1142 Type 2 diabetes mellitus with diabetic polyneuropathy: Secondary | ICD-10-CM | POA: Diagnosis not present

## 2022-12-14 DIAGNOSIS — Z89511 Acquired absence of right leg below knee: Secondary | ICD-10-CM | POA: Diagnosis not present

## 2022-12-14 DIAGNOSIS — Z4781 Encounter for orthopedic aftercare following surgical amputation: Secondary | ICD-10-CM | POA: Diagnosis not present

## 2022-12-14 DIAGNOSIS — K760 Fatty (change of) liver, not elsewhere classified: Secondary | ICD-10-CM | POA: Diagnosis not present

## 2022-12-14 DIAGNOSIS — G4733 Obstructive sleep apnea (adult) (pediatric): Secondary | ICD-10-CM | POA: Diagnosis not present

## 2022-12-14 DIAGNOSIS — M160 Bilateral primary osteoarthritis of hip: Secondary | ICD-10-CM | POA: Diagnosis not present

## 2022-12-14 DIAGNOSIS — L03119 Cellulitis of unspecified part of limb: Secondary | ICD-10-CM | POA: Diagnosis not present

## 2022-12-17 ENCOUNTER — Ambulatory Visit (INDEPENDENT_AMBULATORY_CARE_PROVIDER_SITE_OTHER): Payer: Medicare HMO | Admitting: Orthopedic Surgery

## 2022-12-17 ENCOUNTER — Ambulatory Visit (INDEPENDENT_AMBULATORY_CARE_PROVIDER_SITE_OTHER): Payer: Medicare HMO | Admitting: Pharmacist

## 2022-12-17 DIAGNOSIS — Z7901 Long term (current) use of anticoagulants: Secondary | ICD-10-CM | POA: Diagnosis not present

## 2022-12-17 DIAGNOSIS — H349 Unspecified retinal vascular occlusion: Secondary | ICD-10-CM | POA: Diagnosis not present

## 2022-12-17 DIAGNOSIS — H3411 Central retinal artery occlusion, right eye: Secondary | ICD-10-CM | POA: Diagnosis not present

## 2022-12-17 DIAGNOSIS — Z89512 Acquired absence of left leg below knee: Secondary | ICD-10-CM

## 2022-12-17 LAB — POCT INR: INR: 2 (ref 2.0–3.0)

## 2022-12-17 MED ORDER — CIPROFLOXACIN HCL 500 MG PO TABS: 500.0000 mg | ORAL_TABLET | Freq: Two times a day (BID) | ORAL | 0 refills | Status: DC

## 2022-12-17 MED ORDER — HYDROCODONE-ACETAMINOPHEN 5-325 MG PO TABS: 1.0000 | ORAL_TABLET | Freq: Four times a day (QID) | ORAL | 0 refills | Status: DC | PRN

## 2022-12-17 NOTE — Progress Notes (Signed)
Anticoagulation Management Gregory May is a 62 y.o. male who reports to the clinic for monitoring of warfarin treatment.    Indication:  Central vascular occlusion of RIGHT eye, long term current use of oral anticoagulant, warfarin. Target INR range is 2.0 - 3.0   Duration: indefinite Supervising physician:  Charissa Bash, MD  Anticoagulation Clinic Visit History: Patient does not report signs/symptoms of bleeding or thromboembolism  Other recent changes: No diet, medications, lifestyle EXCEPT AS NOTED IN PATIENT FINDINGS. COMMENCING CIPRO today--anticipate marked hypoprothrombinemic response, have empirically reduced daily dose of warfarin and will re-check INR on Thursday 9-MAY-24. Anticoagulation Episode Summary     Current INR goal:  2.0-3.0  TTR:  51.9 % (7.7 mo)  Next INR check:  12/20/2022  INR from last check:  2.0 (12/17/2022)  Weekly max warfarin dose:    Target end date:    INR check location:    Preferred lab:    Send INR reminders to:     Indications   Pulmonary emboli (HCC) (Resolved) [I26.99] DVT (deep venous thrombosis) (HCC) (Resolved) [I82.409] Retinal artery occlusion central [H34.10] Right retinal embolus [H34.9] Long term (current) use of anticoagulants [Z79.01]        Comments:           No Known Allergies  Current Outpatient Medications:    Accu-Chek FastClix Lancets MISC, Use Accu Chek Fastclix lancets to check blood sugar three times daily. DX:E11.65, Disp: 300 each, Rfl: 2   ACCU-CHEK GUIDE test strip, TEST BLOOD SUGAR THREE TIMES DAILY AS DIRECTED, Disp: 300 strip, Rfl: 3   acetaminophen (TYLENOL) 500 MG tablet, Take 1,000 mg by mouth as needed for mild pain or moderate pain., Disp: , Rfl:    alum & mag hydroxide-simeth (MAALOX/MYLANTA) 200-200-20 MG/5ML suspension, Take 15-30 mLs by mouth every 2 (two) hours as needed for indigestion., Disp: 355 mL, Rfl: 0   APPLE CIDER VINEGAR PO, Take 1 tablet by mouth daily., Disp: , Rfl:    ascorbic  acid (VITAMIN C) 1000 MG tablet, Take 1 tablet (1,000 mg total) by mouth daily., Disp: , Rfl:    bisacodyl (DULCOLAX) 5 MG EC tablet, Take 1 tablet (5 mg total) by mouth daily as needed for moderate constipation., Disp: 30 tablet, Rfl: 0   cephALEXin (KEFLEX) 500 MG capsule, Take 1 capsule (500 mg total) by mouth 3 (three) times daily., Disp: 30 capsule, Rfl: 0   ciprofloxacin (CIPRO) 500 MG tablet, Take 1 tablet (500 mg total) by mouth 2 (two) times daily., Disp: 20 tablet, Rfl: 0   empagliflozin (JARDIANCE) 25 MG TABS tablet, Take 1 tablet (25 mg total) by mouth daily., Disp: 90 tablet, Rfl: 3   fluticasone (FLONASE) 50 MCG/ACT nasal spray, USE 2 SPRAY(S) IN EACH NOSTRIL AT BEDTIME AS NEEDED FOR  SINUS  DRAINAGE (Patient taking differently: Place 1-2 sprays into both nostrils daily.), Disp: 16 g, Rfl: 0   gabapentin (NEURONTIN) 300 MG capsule, TAKE 1 CAPSULE FOUR TIMES DAILY AS NEEDED (Patient taking differently: Take 600 mg by mouth as needed (pain).), Disp: 360 capsule, Rfl: 0   HYDROcodone-acetaminophen (NORCO/VICODIN) 5-325 MG tablet, Take 1 tablet by mouth every 6 (six) hours as needed for moderate pain., Disp: 30 tablet, Rfl: 0   insulin aspart (NOVOLOG) 100 UNIT/ML injection, Inject 0-9 Units into the skin 3 (three) times daily with meals. Correction coverage: Sensitive (thin, NPO, renal)  CBG < 70: Implement Hypoglycemia Standing Orders and refer to Hypoglycemia Standing Orders sidebar report  CBG 70 - 120:  0 units  CBG 121 - 150: 1 unit  CBG 151 - 200: 2 units  CBG 201 - 250: 3 units  CBG 251 - 300: 5 units  CBG 301 - 350: 7 units  CBG 351 - 400 9 units  CBG > 400  10 units, Disp: 10 mL, Rfl: 11   Insulin Syringes, Disposable, U-100 0.5 ML MISC, Use to inject insulin, Disp: 100 each, Rfl: 2   metFORMIN (GLUCOPHAGE-XR) 750 MG 24 hr tablet, TAKE 2 TABLETS EVERY DAY, Disp: 180 tablet, Rfl: 3   Multiple Vitamin (MULTIVITAMIN WITH MINERALS) TABS tablet, Take 1 tablet by mouth every morning. Centrum,  Disp: , Rfl:    pioglitazone (ACTOS) 30 MG tablet, TAKE 1 TABLET EVERY DAY, Disp: 90 tablet, Rfl: 3   prednisoLONE acetate (PRED FORTE) 1 % ophthalmic suspension, Place 1 drop into the right eye every evening., Disp: , Rfl:    rosuvastatin (CRESTOR) 20 MG tablet, Take 1 tablet (20 mg total) by mouth daily., Disp: 90 tablet, Rfl: 2   warfarin (COUMADIN) 5 MG tablet, Take 2 (two) of your 5 mg peach-colored warfarin tablets on Mondays, Wednesdays and Fridays. All OTHER DAYS, (Sundays, Tuesdays, Thursdays and Saturdays) take two and one-half ( 2 & 1/2 tablets.), Disp: 68 tablet, Rfl: 2   zinc sulfate 220 (50 Zn) MG capsule, Take 1 capsule (220 mg total) by mouth daily., Disp: , Rfl:    guaiFENesin-dextromethorphan (ROBITUSSIN DM) 100-10 MG/5ML syrup, Take 15 mLs by mouth every 4 (four) hours as needed for cough. (Patient not taking: Reported on 12/17/2022), Disp: 118 mL, Rfl: 0   magnesium citrate SOLN, Take 296 mLs (1 Bottle total) by mouth once as needed for severe constipation. (Patient not taking: Reported on 12/17/2022), Disp: 195 mL, Rfl:    mupirocin ointment (BACTROBAN) 2 %, Place 1 Application into the nose 2 (two) times daily. (Patient not taking: Reported on 12/17/2022), Disp: 22 g, Rfl: 0   pantoprazole (PROTONIX) 40 MG tablet, Take 1 tablet (40 mg total) by mouth daily. (Patient not taking: Reported on 12/17/2022), Disp: , Rfl:    phenol (CHLORASEPTIC) 1.4 % LIQD, Use as directed 1 spray in the mouth or throat as needed for throat irritation / pain. (Patient not taking: Reported on 12/17/2022), Disp: , Rfl: 0   polyethylene glycol (MIRALAX / GLYCOLAX) 17 g packet, Take 17 g by mouth daily as needed for mild constipation. (Patient not taking: Reported on 12/17/2022), Disp: 14 each, Rfl: 0   senna-docusate (SENOKOT-S) 8.6-50 MG tablet, Take 1 tablet by mouth at bedtime as needed for mild constipation. (Patient not taking: Reported on 12/17/2022), Disp: , Rfl:  Past Medical History:  Diagnosis Date    Arthritis    bilateral hips   Cutaneous abscess of left foot    Deep vein thrombosis (DVT) (HCC)    Diabetes mellitus    type II   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 Date of diagnosis February 08 2014 Duration of anticoagulation: 6 months. End date 08/12/2014.  Anticoagulant: Lovenox 120 units daily Switched to Eliquis on 05/25/2014      Ear drum perforation, right 03/06/2019   Non-pressure chronic ulcer of right calf, limited to breakdown of skin (HCC) 04/17/2017   Pulmonary emboli (HCC) 02/08/2014   Date of diagnosis February 08 2014, on chest CTA Hospitalized for 3 days Had some symptoms of shortness  of breath, and chest pain With intercurrent DVT of the left LE. Duration of anticoagulation: 8 months. End date 10/11/2014.  Anticoagulant: Lovenox 120 units daily Switched to Eliquis on 05/25/2014 per patient preference    Pulmonary embolism (HCC)    Sebaceous cyst    on back of neck   Social History   Socioeconomic History   Marital status: Single    Spouse name: Not on file   Number of children: 0   Years of education: Not on file   Highest education level: Not on file  Occupational History   Occupation: disabled  Tobacco Use   Smoking status: Never   Smokeless tobacco: Never  Vaping Use   Vaping Use: Never used  Substance and Sexual Activity   Alcohol use: Not Currently    Comment: beer and mixed drink maybe 5  times a month   Drug use: No   Sexual activity: Never  Other Topics Concern   Not on file  Social History Narrative   Not on file   Social Determinants of Health   Financial Resource Strain: Low Risk  (12/15/2021)   Overall Financial Resource Strain (CARDIA)    Difficulty of Paying Living Expenses: Not hard at all  Food Insecurity: No Food Insecurity (03/29/2022)   Hunger Vital Sign    Worried About Running Out of Food in the Last Year: Never true    Ran  Out of Food in the Last Year: Never true  Transportation Needs: No Transportation Needs (03/29/2022)   PRAPARE - Administrator, Civil Service (Medical): No    Lack of Transportation (Non-Medical): No  Physical Activity: Inactive (12/15/2021)   Exercise Vital Sign    Days of Exercise per Week: 0 days    Minutes of Exercise per Session: 0 min  Stress: No Stress Concern Present (12/15/2021)   [employer] of Occupational Health - Occupational Stress Questionnaire    Feeling of Stress : Not at all  Social Connections: Socially Isolated (12/15/2021)   Social Connection and Isolation Panel [NHANES]    Frequency of Communication with Friends and Family: More than three times a week    Frequency of Social Gatherings with Friends and Family: More than three times a week    Attends Religious Services: Never    Database administrator or Organizations: No    Attends Engineer, structural: Never    Marital Status: Never married   Family History  Problem Relation Age of Onset   Breast cancer Mother    Cancer Mother        small intestine   Liver cancer Mother    Diabetes Mother    Diabetes Father    Diabetes Brother    Hypertension Maternal Grandmother    Heart Problems Maternal Grandmother    Diabetes Paternal Grandmother    Diabetes Paternal Grandfather    Diabetes Brother     ASSESSMENT Recent Results: The most recent result is correlated with 77.5 mg per week: Lab Results  Component Value Date   INR 2.0 12/17/2022   INR 3.0 11/19/2022   INR 1.5 (H) 10/03/2022    Anticoagulation Dosing: Description   Take 1 (one) of your 5 mg peach-colored warfarin tablets, on MONDAY, TUESDAY and, WEDNESDAY. On THURSDAY, May 9th, please return to the [facility] for a repeat INR at 3:00 pm.      INR today: Therapeutic  PLAN Weekly dose was decreased empirically due to ciprofloxicin being commenced today. Will  give 5mg  by mouth today, tomorrow and Wednesday,  and repeat INR on Thursday, 9-MAY-24.  Patient Instructions  Patient instructed to take medications as defined in the Anti-coagulation Track section of this encounter.  Patient instructed to take today's dose.  Patient instructed to take 1 (one) of your 5 mg peach-colored warfarin tablets, on MONDAY, TUESDAY and, WEDNESDAY. On THURSDAY, May 9th, please return to the [facility] for a repeat INR at 3:00 pm. Patient verbalized understanding of these instructions.  Patient advised to contact clinic or seek medical attention if signs/symptoms of bleeding or thromboembolism occur.  Patient verbalized understanding by repeating back information and was advised to contact me if further medication-related questions arise. Patient was also provided an information handout.  Follow-up Return in 3 days (on 12/20/2022) for Follow up INR.  Elicia Lamp, PharmD, CPP  15 minutes spent face-to-face with the patient during the encounter. 50% of time spent on education, including signs/sx bleeding and clotting, as well as food and drug interactions with warfarin. 50% of time was spent on fingerprick POC INR sample collection,processing, results determination, and documentation in [email or web address].

## 2022-12-17 NOTE — Patient Instructions (Signed)
Patient instructed to take medications as defined in the Anti-coagulation Track section of this encounter.  Patient instructed to take today's dose.  Patient instructed to take 1 (one) of your 5 mg peach-colored warfarin tablets, on MONDAY, TUESDAY and, WEDNESDAY. On THURSDAY, May 9th, please return to the [facility] Center for a repeat INR at 3:00 pm. Patient verbalized understanding of these instructions.

## 2022-12-18 ENCOUNTER — Encounter: Payer: Self-pay | Admitting: Orthopedic Surgery

## 2022-12-18 DIAGNOSIS — E7800 Pure hypercholesterolemia, unspecified: Secondary | ICD-10-CM | POA: Diagnosis not present

## 2022-12-18 DIAGNOSIS — L03119 Cellulitis of unspecified part of limb: Secondary | ICD-10-CM | POA: Diagnosis not present

## 2022-12-18 DIAGNOSIS — G4733 Obstructive sleep apnea (adult) (pediatric): Secondary | ICD-10-CM | POA: Diagnosis not present

## 2022-12-18 DIAGNOSIS — M160 Bilateral primary osteoarthritis of hip: Secondary | ICD-10-CM | POA: Diagnosis not present

## 2022-12-18 DIAGNOSIS — H3411 Central retinal artery occlusion, right eye: Secondary | ICD-10-CM | POA: Diagnosis not present

## 2022-12-18 DIAGNOSIS — K760 Fatty (change of) liver, not elsewhere classified: Secondary | ICD-10-CM | POA: Diagnosis not present

## 2022-12-18 DIAGNOSIS — Z4781 Encounter for orthopedic aftercare following surgical amputation: Secondary | ICD-10-CM | POA: Diagnosis not present

## 2022-12-18 DIAGNOSIS — E1142 Type 2 diabetes mellitus with diabetic polyneuropathy: Secondary | ICD-10-CM | POA: Diagnosis not present

## 2022-12-18 DIAGNOSIS — G629 Polyneuropathy, unspecified: Secondary | ICD-10-CM | POA: Diagnosis not present

## 2022-12-18 NOTE — Progress Notes (Signed)
Office Visit Note   Patient: Gregory May           Date of Birth: 03-07-1961           MRN: [id number] Visit Date: 12/17/2022              Requested by: Gwenevere Abbot, MD [address] [place],  [place] [address] PCP: Gwenevere Abbot, MD  Chief Complaint  Patient presents with   Left Leg - Follow-up    Left below knee amputation 09/29/2022      HPI: Patient is a 62 year old gentleman who is 10 weeks status post left transtibial amputation.  Patient has been on Augmentin.  Assessment & Plan: Visit Diagnoses:  1. Left below-knee amputee [facility])     Plan: With the new green drainage will start Cipro and provided prescription for Vicodin.  Patient is on Coumadin and pharmacy has contacted me stating they will adjust his Coumadin dose.  Follow-Up Instructions: Return in about 2 weeks (around 12/31/2022).   Ortho Exam  Patient is alert, oriented, no adenopathy, well-dressed, normal affect, normal respiratory effort. Examination there is improved granulation tissue over the wounds.  The cellulitis is improving however there is now greenish drainage on the dressing that with concern for Pseudomonas.  Will broaden the antibiotic coverage with Cipro.  Imaging: No results found. No images are attached to the encounter.  Labs: Lab Results  Component Value Date   HGBA1C 6.9 (H) 09/24/2022   HGBA1C 7.2 (H) 03/08/2022   HGBA1C 7.2 (H) 11/29/2021   ESRSEDRATE 11 11/15/2021   ESRSEDRATE 25 (H) 08/19/2021   ESRSEDRATE 8 07/15/2019   CRP 0.7 11/17/2021   CRP <0.5 11/15/2021   CRP 1.7 (H) 08/19/2021   REPTSTATUS 10/03/2022 FINAL 09/28/2022   REPTSTATUS 10/03/2022 FINAL 09/28/2022   GRAMSTAIN  08/21/2021    WBC PRESENT,BOTH PMN AND MONONUCLEAR NO ORGANISMS SEEN    CULT  09/28/2022    NO GROWTH 5 DAYS Performed at [facility] Lab, [address], [place], [place] [address]    CULT  09/28/2022    NO GROWTH 5 DAYS Performed at [facility] Lab, [address],  [place], [place] [address]    [facility] STAPHYLOCOCCUS HOMINIS 08/21/2021     Lab Results  Component Value Date   ALBUMIN 2.8 (L) 09/28/2022   ALBUMIN 4.1 09/24/2022   ALBUMIN 4.2 03/08/2022    Lab Results  Component Value Date   MG 1.9 11/15/2021   Lab Results  Component Value Date   VD25OH 16 (L) 11/05/2013    No results found for: "PREALBUMIN"    Latest Ref Rng & Units 10/03/2022    2:13 AM 10/01/2022    5:02 AM 09/30/2022    4:06 AM  CBC EXTENDED  WBC 4.0 - 10.5 K/uL 12.8  12.6  14.0   RBC 4.22 - 5.81 MIL/uL 4.26  4.25  3.84   Hemoglobin 13.0 - 17.0 g/dL 46.9  62.9  52.8   HCT 39.0 - 52.0 % 39.1  39.4  36.3   Platelets 150 - 400 K/uL 413  369  325      There is no height or weight on file to calculate BMI.  Orders:  No orders of the defined types were placed in this encounter.  Meds ordered this encounter  Medications   ciprofloxacin (CIPRO) 500 MG tablet    Sig: Take 1 tablet (500 mg total) by mouth 2 (two) times daily.    Dispense:  20  tablet    Refill:  0   HYDROcodone-acetaminophen (NORCO/VICODIN) 5-325 MG tablet    Sig: Take 1 tablet by mouth every 6 (six) hours as needed for moderate pain.    Dispense:  30 tablet    Refill:  0     Procedures: No procedures performed  Clinical Data: No additional findings.  ROS:  All other systems negative, except as noted in the HPI. Review of Systems  Objective: Vital Signs: There were no vitals taken for this visit.  Specialty Comments:  No specialty comments available.  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) 10/03/2022   Lower extremity cellulitis 09/29/2022   Acute osteomyelitis of left foot (HCC) 09/29/2022   PFO with atrial septal aneurysm    Long term (current) use of anticoagulants 02/26/2022   Right retinal embolus 01/15/2022   DM type 2 with diabetic peripheral neuropathy (HCC) 01/15/2022    Obstructive sleep apnea 01/15/2022   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   Transportation insecurity 11/23/2021   Retinal artery occlusion, central 11/15/2021   Epidermal inclusion cyst 10/18/2021   Amputated finger 09/05/2021   Osteomyelitis of finger of right hand (HCC) 08/19/2021   Hepatic steatosis 06/24/2020   Back pain 04/20/2020   Left knee pain 04/20/2020   Allergic sinusitis 04/20/2020   Elevated liver enzymes 04/20/2020   Cutaneous abscess of left foot    Diastasis of rectus abdominis 07/16/2019   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 10/07/2018   Essential hypertension 06/19/2018   Peripheral neuropathy 12/19/2017   Left below-knee amputee (HCC) 04/17/2017   Carpal tunnel syndrome 03/18/2017   Healthcare maintenance 06/21/2015   Status post below knee amputation of right lower extremity (HCC) 06/07/2015   Diabetes mellitus with carpal tunnel syndrome (HCC) 12/09/2014   DJD (degenerative joint disease) of knee 07/21/2014   Osteoarthritis, hip, bilateral 05/25/2014   Onychomycosis 10/14/2013   Pure hypercholesterolemia 09/25/2013   Type 2 diabetes with complication (HCC) 09/15/1997   Past Medical History:  Diagnosis Date   Arthritis    bilateral hips   Cutaneous abscess of left foot    Deep vein thrombosis (DVT) (HCC)    Diabetes mellitus    type II   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 Date of diagnosis February 08 2014 Duration of anticoagulation: 6 months. End date 08/12/2014.  Anticoagulant: Lovenox 120 units daily Switched to Eliquis on 05/25/2014      Ear drum perforation, right 03/06/2019   Non-pressure chronic ulcer of right calf, limited to breakdown of skin (HCC) 04/17/2017   Pulmonary emboli (HCC) 02/08/2014   Date of diagnosis February 08 2014, on chest CTA Hospitalized for 3 days Had some symptoms of  shortness of breath, and chest pain With intercurrent DVT of the left LE. Duration of anticoagulation: 8 months. End date 10/11/2014.  Anticoagulant: Lovenox 120 units daily Switched to Eliquis on 05/25/2014 per patient preference    Pulmonary embolism (HCC)    Sebaceous cyst    on back of neck    Family History  Problem Relation Age of Onset   Breast cancer Mother    Cancer Mother        small intestine   Liver cancer Mother    Diabetes Mother  Diabetes Father    Diabetes Brother    Hypertension Maternal Grandmother    Heart Problems Maternal Grandmother    Diabetes Paternal Grandmother    Diabetes Paternal Grandfather    Diabetes Brother     Past Surgical History:  Procedure Laterality Date   AMPUTATION Right 06/03/2015   Procedure: Right Below Knee Amputation;  Surgeon: Nadara Mustard, MD;  Location: [facility] OR;  Service: Orthopedics;  Laterality: Right;   AMPUTATION Left 07/24/2019   Procedure: LEFT FOOT 3RD RAY AMPUTATION;  Surgeon: Nadara Mustard, MD;  Location: [facility] OR;  Service: Orthopedics;  Laterality: Left;   AMPUTATION Left 06/23/2021   Procedure: LEFT 2ND TOE AMPUTATION;  Surgeon: Nadara Mustard, MD;  Location: [facility] OR;  Service: Orthopedics;  Laterality: Left;   AMPUTATION Right 08/21/2021   Procedure: AMPUTATION RIGHT INDEX FINGER;  Surgeon: Gomez Cleverly, MD;  Location: [facility] OR;  Service: Orthopedics;  Laterality: Right;   AMPUTATION Left 09/29/2022   Procedure: AMPUTATION BELOW KNEE;  Surgeon: Nadara Mustard, MD;  Location: [facility] OR;  Service: Orthopedics;  Laterality: Left;   BUBBLE STUDY  01/24/2022   Procedure: BUBBLE STUDY;  Surgeon: Wendall Stade, MD;  Location: [facility] ENDOSCOPY;  Service: Cardiovascular;;   CLOSED REDUCTION WITH HUMER PIN INSERTION  1974   left hip   HARDWARE REMOVAL Left 07/21/2014   Procedure: HARDWARE REMOVAL;  Surgeon: Loreta Ave, MD;  Location: [facility] OR;  Service: Orthopedics;  Laterality: Left;   I & D EXTREMITY Right 08/21/2021   Procedure: IRRIGATION  AND DEBRIDEMENT RIGHT INDEX FINGER;  Surgeon: Gomez Cleverly, MD;  Location: [facility] OR;  Service: Orthopedics;  Laterality: Right;   PATENT FORAMEN OVALE(PFO) CLOSURE N/A 02/28/2022   Procedure: PATENT FORAMEN OVALE(PFO) CLOSURE;  Surgeon: Tonny Bollman, MD;  Location: [facility] INVASIVE CV LAB;  Service: Cardiovascular;  Laterality: N/A;   ROTATOR CUFF REPAIR Right 2005 (approx)   TEE WITHOUT CARDIOVERSION N/A 01/24/2022   Procedure: TRANSESOPHAGEAL ECHOCARDIOGRAM (TEE);  Surgeon: Wendall Stade, MD;  Location: [facility] ENDOSCOPY;  Service: Cardiovascular;  Laterality: N/A;   TOTAL HIP ARTHROPLASTY Left 07/21/2014   Procedure: TOTAL HIP ARTHROPLASTY ANTERIOR APPROACH;  Surgeon: Loreta Ave, MD;  Location: [facility] OR;  Service: Orthopedics;  Laterality: Left;   TOTAL HIP ARTHROPLASTY Right 2006 (approx)   right hip replaced   Social History   Occupational History   Occupation: disabled  Tobacco Use   Smoking status: Never   Smokeless tobacco: Never  Vaping Use   Vaping Use: Never used  Substance and Sexual Activity   Alcohol use: Not Currently    Comment: beer and mixed drink maybe 5  times a month   Drug use: No   Sexual activity: Never

## 2022-12-20 ENCOUNTER — Ambulatory Visit (INDEPENDENT_AMBULATORY_CARE_PROVIDER_SITE_OTHER): Payer: Medicare HMO | Admitting: Pharmacist

## 2022-12-20 DIAGNOSIS — Z7901 Long term (current) use of anticoagulants: Secondary | ICD-10-CM | POA: Diagnosis not present

## 2022-12-20 DIAGNOSIS — H349 Unspecified retinal vascular occlusion: Secondary | ICD-10-CM | POA: Diagnosis not present

## 2022-12-20 DIAGNOSIS — H3411 Central retinal artery occlusion, right eye: Secondary | ICD-10-CM | POA: Diagnosis not present

## 2022-12-20 LAB — POCT INR: INR: 2.1 (ref 2.0–3.0)

## 2022-12-20 NOTE — Progress Notes (Signed)
Anticoagulation Management Gregory May is a 62 y.o. male who reports to the clinic for monitoring of warfarin treatment.    Indication: Retinal artery occlusion, central  Right retinal embolus  Long term (current) use of anticoagulants  Duration: indefinite Supervising physician: Erlinda Hong  Anticoagulation Clinic Visit History: Patient does not report signs/symptoms of bleeding or thromboembolism  Other recent changes: No diet, medications, lifestyle except as noted in patient findings. Anticoagulation Episode Summary     Current INR goal:  2.0-3.0  TTR:  52.5 % (7.8 mo)  Next INR check:  12/24/2022  INR from last check:  2.1 (12/20/2022)  Weekly max warfarin dose:    Target end date:    INR check location:    Preferred lab:    Send INR reminders to:     Indications   Pulmonary emboli (HCC) (Resolved) [I26.99] DVT (deep venous thrombosis) (HCC) (Resolved) [I82.409] Retinal artery occlusion central [H34.10] Right retinal embolus [H34.9] Long term (current) use of anticoagulants [Z79.01]        Comments:           No Known Allergies  Current Outpatient Medications:    Accu-Chek FastClix Lancets MISC, Use Accu Chek Fastclix lancets to check blood sugar three times daily. DX:E11.65, Disp: 300 each, Rfl: 2   ACCU-CHEK GUIDE test strip, TEST BLOOD SUGAR THREE TIMES DAILY AS DIRECTED, Disp: 300 strip, Rfl: 3   acetaminophen (TYLENOL) 500 MG tablet, Take 1,000 mg by mouth as needed for mild pain or moderate pain., Disp: , Rfl:    alum & mag hydroxide-simeth (MAALOX/MYLANTA) 200-200-20 MG/5ML suspension, Take 15-30 mLs by mouth every 2 (two) hours as needed for indigestion., Disp: 355 mL, Rfl: 0   APPLE CIDER VINEGAR PO, Take 1 tablet by mouth daily., Disp: , Rfl:    ascorbic acid (VITAMIN C) 1000 MG tablet, Take 1 tablet (1,000 mg total) by mouth daily., Disp: , Rfl:    bisacodyl (DULCOLAX) 5 MG EC tablet, Take 1 tablet (5 mg total) by mouth daily as needed for  moderate constipation., Disp: 30 tablet, Rfl: 0   cephALEXin (KEFLEX) 500 MG capsule, Take 1 capsule (500 mg total) by mouth 3 (three) times daily., Disp: 30 capsule, Rfl: 0   ciprofloxacin (CIPRO) 500 MG tablet, Take 1 tablet (500 mg total) by mouth 2 (two) times daily., Disp: 20 tablet, Rfl: 0   empagliflozin (JARDIANCE) 25 MG TABS tablet, Take 1 tablet (25 mg total) by mouth daily., Disp: 90 tablet, Rfl: 3   fluticasone (FLONASE) 50 MCG/ACT nasal spray, USE 2 SPRAY(S) IN EACH NOSTRIL AT BEDTIME AS NEEDED FOR  SINUS  DRAINAGE (Patient taking differently: Place 1-2 sprays into both nostrils daily.), Disp: 16 g, Rfl: 0   gabapentin (NEURONTIN) 300 MG capsule, TAKE 1 CAPSULE FOUR TIMES DAILY AS NEEDED (Patient taking differently: Take 600 mg by mouth as needed (pain).), Disp: 360 capsule, Rfl: 0   HYDROcodone-acetaminophen (NORCO/VICODIN) 5-325 MG tablet, Take 1 tablet by mouth every 6 (six) hours as needed for moderate pain., Disp: 30 tablet, Rfl: 0   insulin aspart (NOVOLOG) 100 UNIT/ML injection, Inject 0-9 Units into the skin 3 (three) times daily with meals. Correction coverage: Sensitive (thin, NPO, renal)  CBG < 70: Implement Hypoglycemia Standing Orders and refer to Hypoglycemia Standing Orders sidebar report  CBG 70 - 120: 0 units  CBG 121 - 150: 1 unit  CBG 151 - 200: 2 units  CBG 201 - 250: 3 units  CBG 251 - 300: 5 units  CBG 301 - 350: 7 units  CBG 351 - 400 9 units  CBG > 400  10 units, Disp: 10 mL, Rfl: 11   Insulin Syringes, Disposable, U-100 0.5 ML MISC, Use to inject insulin, Disp: 100 each, Rfl: 2   magnesium citrate SOLN, Take 296 mLs (1 Bottle total) by mouth once as needed for severe constipation., Disp: 195 mL, Rfl:    metFORMIN (GLUCOPHAGE-XR) 750 MG 24 hr tablet, TAKE 2 TABLETS EVERY DAY, Disp: 180 tablet, Rfl: 3   Multiple Vitamin (MULTIVITAMIN WITH MINERALS) TABS tablet, Take 1 tablet by mouth every morning. Centrum, Disp: , Rfl:    mupirocin ointment (BACTROBAN) 2 %, Place 1  Application into the nose 2 (two) times daily., Disp: 22 g, Rfl: 0   pantoprazole (PROTONIX) 40 MG tablet, Take 1 tablet (40 mg total) by mouth daily., Disp: , Rfl:    phenol (CHLORASEPTIC) 1.4 % LIQD, Use as directed 1 spray in the mouth or throat as needed for throat irritation / pain., Disp: , Rfl: 0   pioglitazone (ACTOS) 30 MG tablet, TAKE 1 TABLET EVERY DAY, Disp: 90 tablet, Rfl: 3   polyethylene glycol (MIRALAX / GLYCOLAX) 17 g packet, Take 17 g by mouth daily as needed for mild constipation., Disp: 14 each, Rfl: 0   prednisoLONE acetate (PRED FORTE) 1 % ophthalmic suspension, Place 1 drop into the right eye every evening., Disp: , Rfl:    rosuvastatin (CRESTOR) 20 MG tablet, Take 1 tablet (20 mg total) by mouth daily., Disp: 90 tablet, Rfl: 2   senna-docusate (SENOKOT-S) 8.6-50 MG tablet, Take 1 tablet by mouth at bedtime as needed for mild constipation., Disp: , Rfl:    warfarin (COUMADIN) 5 MG tablet, Take 2 (two) of your 5 mg peach-colored warfarin tablets on Mondays, Wednesdays and Fridays. All OTHER DAYS, (Sundays, Tuesdays, Thursdays and Saturdays) take two and one-half ( 2 & 1/2 tablets.), Disp: 68 tablet, Rfl: 2   zinc sulfate 220 (50 Zn) MG capsule, Take 1 capsule (220 mg total) by mouth daily., Disp: , Rfl:    guaiFENesin-dextromethorphan (ROBITUSSIN DM) 100-10 MG/5ML syrup, Take 15 mLs by mouth every 4 (four) hours as needed for cough. (Patient not taking: Reported on 12/17/2022), Disp: 118 mL, Rfl: 0 Past Medical History:  Diagnosis Date   Arthritis    bilateral hips   Cutaneous abscess of left foot    Deep vein thrombosis (DVT) (HCC)    Diabetes mellitus    type II   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 Date of diagnosis February 08 2014 Duration of anticoagulation: 6 months. End date 08/12/2014.  Anticoagulant: Lovenox 120 units daily Switched to Eliquis on 05/25/2014       Ear drum perforation, right 03/06/2019   Non-pressure chronic ulcer of right calf, limited to breakdown of skin (HCC) 04/17/2017   Pulmonary emboli (HCC) 02/08/2014   Date of diagnosis February 08 2014, on chest CTA Hospitalized for 3 days Had some symptoms of shortness of breath, and chest pain With intercurrent DVT of the left LE. Duration of anticoagulation: 8 months. End date 10/11/2014.  Anticoagulant: Lovenox 120 units daily Switched to Eliquis on 05/25/2014 per patient preference    Pulmonary embolism (HCC)    Sebaceous cyst    on back of neck   Social History   Socioeconomic History   Marital status: Single  Spouse name: Not on file   Number of children: 0   Years of education: Not on file   Highest education level: Not on file  Occupational History   Occupation: disabled  Tobacco Use   Smoking status: Never   Smokeless tobacco: Never  Vaping Use   Vaping Use: Never used  Substance and Sexual Activity   Alcohol use: Not Currently    Comment: beer and mixed drink maybe 5  times a month   Drug use: No   Sexual activity: Never  Other Topics Concern   Not on file  Social History Narrative   Not on file   Social Determinants of Health   Financial Resource Strain: Low Risk  (12/15/2021)   Overall Financial Resource Strain (CARDIA)    Difficulty of Paying Living Expenses: Not hard at all  Food Insecurity: No Food Insecurity (03/29/2022)   Hunger Vital Sign    Worried About Running Out of Food in the Last Year: Never true    Ran Out of Food in the Last Year: Never true  Transportation Needs: No Transportation Needs (03/29/2022)   PRAPARE - Administrator, Civil Service (Medical): No    Lack of Transportation (Non-Medical): No  Physical Activity: Inactive (12/15/2021)   Exercise Vital Sign    Days of Exercise per Week: 0 days    Minutes of Exercise per Session: 0 min  Stress: No Stress Concern Present (12/15/2021)   [employer] of Occupational Health -  Occupational Stress Questionnaire    Feeling of Stress : Not at all  Social Connections: Socially Isolated (12/15/2021)   Social Connection and Isolation Panel [NHANES]    Frequency of Communication with Friends and Family: More than three times a week    Frequency of Social Gatherings with Friends and Family: More than three times a week    Attends Religious Services: Never    Database administrator or Organizations: No    Attends Engineer, structural: Never    Marital Status: Never married   Family History  Problem Relation Age of Onset   Breast cancer Mother    Cancer Mother        small intestine   Liver cancer Mother    Diabetes Mother    Diabetes Father    Diabetes Brother    Hypertension Maternal Grandmother    Heart Problems Maternal Grandmother    Diabetes Paternal Grandmother    Diabetes Paternal Grandfather    Diabetes Brother     ASSESSMENT Recent Results: The most recent result is correlated with 5 millgrams of warfarin for past three days while commencing ciprofloxicin 500mg  BID with 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 Lab Results  Component Value Date   INR 2.1 12/20/2022   INR 2.0 12/17/2022   INR 3.0 11/19/2022    Anticoagulation Dosing: Description   Take 1 and 1/2 of your 5 mg peach-colored warfarin tablets on Thursday, Friday and Saturday; on Sunday--take only one (1) tablet. Return to the clinic on Monday May 13 at 3:00 pm for repeat INR while on ciprofloxicin (an antibiotic known to interact with warfarin.)     INR today: Therapeutic  PLAN Weekly dose was increased to 7.5mg  for three consecutive days, then 5mg  on Sunday then INR repeat on Monday Dec 24, 2022 at 3:00 PM.   Patient Instructions  Patient instructed to take medications as defined in the Anti-coagulation Track section of this encounter.  Patient instructed to take today's dose.  Patient instructed to take 1 and 1/2 of your 5 mg peach-colored warfarin  tablets on Thursday, Friday and Saturday; on Sunday--take only one (1) tablet. Return to the clinic on Monday May 13 at 3:00 pm for repeat INR while on ciprofloxicin (an antibiotic known to interact with warfarin.) Patient verbalized understanding of these instructions.  Patient advised to contact clinic or seek medical attention if signs/symptoms of bleeding or thromboembolism occur.  Patient verbalized understanding by repeating back information and was advised to contact me if further medication-related questions arise. Patient was also provided an information handout.  Follow-up Return in 4 days (on 12/24/2022) for Follow up INR.  Elicia Lamp, PharmD, CPP  15 minutes spent face-to-face with the patient during the encounter. 50% of time spent on education, including signs/sx bleeding and clotting, as well as food and drug interactions with warfarin. 50% of time was spent on fingerprick POC INR sample collection,processing, results determination, and documentation in [email or web address].

## 2022-12-20 NOTE — Patient Instructions (Signed)
Patient instructed to take medications as defined in the Anti-coagulation Track section of this encounter.  Patient instructed to take today's dose.  Patient instructed to take 1 and 1/2 of your 5 mg peach-colored warfarin tablets on Thursday, Friday and Saturday; on Sunday--take only one (1) tablet. Return to the clinic on Monday May 13 at 3:00 pm for repeat INR while on ciprofloxicin (an antibiotic known to interact with warfarin.) Patient verbalized understanding of these instructions.

## 2022-12-21 DIAGNOSIS — Z4781 Encounter for orthopedic aftercare following surgical amputation: Secondary | ICD-10-CM | POA: Diagnosis not present

## 2022-12-21 DIAGNOSIS — E1142 Type 2 diabetes mellitus with diabetic polyneuropathy: Secondary | ICD-10-CM | POA: Diagnosis not present

## 2022-12-21 DIAGNOSIS — M160 Bilateral primary osteoarthritis of hip: Secondary | ICD-10-CM | POA: Diagnosis not present

## 2022-12-21 DIAGNOSIS — L03119 Cellulitis of unspecified part of limb: Secondary | ICD-10-CM | POA: Diagnosis not present

## 2022-12-21 DIAGNOSIS — E7800 Pure hypercholesterolemia, unspecified: Secondary | ICD-10-CM | POA: Diagnosis not present

## 2022-12-21 DIAGNOSIS — K760 Fatty (change of) liver, not elsewhere classified: Secondary | ICD-10-CM | POA: Diagnosis not present

## 2022-12-21 DIAGNOSIS — H3411 Central retinal artery occlusion, right eye: Secondary | ICD-10-CM | POA: Diagnosis not present

## 2022-12-21 DIAGNOSIS — G629 Polyneuropathy, unspecified: Secondary | ICD-10-CM | POA: Diagnosis not present

## 2022-12-21 DIAGNOSIS — G4733 Obstructive sleep apnea (adult) (pediatric): Secondary | ICD-10-CM | POA: Diagnosis not present

## 2022-12-24 ENCOUNTER — Ambulatory Visit: Payer: Medicare HMO

## 2022-12-24 ENCOUNTER — Other Ambulatory Visit: Payer: Self-pay | Admitting: Student

## 2022-12-24 DIAGNOSIS — L03119 Cellulitis of unspecified part of limb: Secondary | ICD-10-CM | POA: Diagnosis not present

## 2022-12-24 DIAGNOSIS — G4733 Obstructive sleep apnea (adult) (pediatric): Secondary | ICD-10-CM | POA: Diagnosis not present

## 2022-12-24 DIAGNOSIS — Z4781 Encounter for orthopedic aftercare following surgical amputation: Secondary | ICD-10-CM | POA: Diagnosis not present

## 2022-12-24 DIAGNOSIS — E1142 Type 2 diabetes mellitus with diabetic polyneuropathy: Secondary | ICD-10-CM | POA: Diagnosis not present

## 2022-12-24 DIAGNOSIS — E7800 Pure hypercholesterolemia, unspecified: Secondary | ICD-10-CM | POA: Diagnosis not present

## 2022-12-24 DIAGNOSIS — M160 Bilateral primary osteoarthritis of hip: Secondary | ICD-10-CM | POA: Diagnosis not present

## 2022-12-24 DIAGNOSIS — K760 Fatty (change of) liver, not elsewhere classified: Secondary | ICD-10-CM | POA: Diagnosis not present

## 2022-12-24 DIAGNOSIS — G629 Polyneuropathy, unspecified: Secondary | ICD-10-CM | POA: Diagnosis not present

## 2022-12-24 DIAGNOSIS — H3411 Central retinal artery occlusion, right eye: Secondary | ICD-10-CM | POA: Diagnosis not present

## 2022-12-25 ENCOUNTER — Telehealth: Payer: Self-pay | Admitting: Orthopedic Surgery

## 2022-12-25 DIAGNOSIS — E7800 Pure hypercholesterolemia, unspecified: Secondary | ICD-10-CM | POA: Diagnosis not present

## 2022-12-25 DIAGNOSIS — L03119 Cellulitis of unspecified part of limb: Secondary | ICD-10-CM | POA: Diagnosis not present

## 2022-12-25 DIAGNOSIS — G629 Polyneuropathy, unspecified: Secondary | ICD-10-CM | POA: Diagnosis not present

## 2022-12-25 DIAGNOSIS — G4733 Obstructive sleep apnea (adult) (pediatric): Secondary | ICD-10-CM | POA: Diagnosis not present

## 2022-12-25 DIAGNOSIS — K760 Fatty (change of) liver, not elsewhere classified: Secondary | ICD-10-CM | POA: Diagnosis not present

## 2022-12-25 DIAGNOSIS — H3411 Central retinal artery occlusion, right eye: Secondary | ICD-10-CM | POA: Diagnosis not present

## 2022-12-25 DIAGNOSIS — E1142 Type 2 diabetes mellitus with diabetic polyneuropathy: Secondary | ICD-10-CM | POA: Diagnosis not present

## 2022-12-25 DIAGNOSIS — Z4781 Encounter for orthopedic aftercare following surgical amputation: Secondary | ICD-10-CM | POA: Diagnosis not present

## 2022-12-25 DIAGNOSIS — M160 Bilateral primary osteoarthritis of hip: Secondary | ICD-10-CM | POA: Diagnosis not present

## 2022-12-25 NOTE — Telephone Encounter (Signed)
Gregory May from [facility] de[facility] called stating they fax for orders and never received order fax back. Gregory May is asking for [name] or [name] to refax order number [phone] to fax number [phone]. [name] secure number is [phone].

## 2022-12-26 ENCOUNTER — Ambulatory Visit (INDEPENDENT_AMBULATORY_CARE_PROVIDER_SITE_OTHER): Payer: Medicare HMO | Admitting: Pharmacist

## 2022-12-26 ENCOUNTER — Other Ambulatory Visit (INDEPENDENT_AMBULATORY_CARE_PROVIDER_SITE_OTHER): Payer: Medicare HMO

## 2022-12-26 DIAGNOSIS — H3411 Central retinal artery occlusion, right eye: Secondary | ICD-10-CM | POA: Diagnosis not present

## 2022-12-26 DIAGNOSIS — H349 Unspecified retinal vascular occlusion: Secondary | ICD-10-CM | POA: Diagnosis not present

## 2022-12-26 DIAGNOSIS — Z7901 Long term (current) use of anticoagulants: Secondary | ICD-10-CM

## 2022-12-26 DIAGNOSIS — E1165 Type 2 diabetes mellitus with hyperglycemia: Secondary | ICD-10-CM

## 2022-12-26 DIAGNOSIS — Z794 Long term (current) use of insulin: Secondary | ICD-10-CM

## 2022-12-26 LAB — COMPREHENSIVE METABOLIC PANEL
ALT: 26 U/L (ref 0–53)
AST: 24 U/L (ref 0–37)
Albumin: 3.8 g/dL (ref 3.5–5.2)
Alkaline Phosphatase: 61 U/L (ref 39–117)
BUN: 15 mg/dL (ref 6–23)
CO2: 29 mEq/L (ref 19–32)
Calcium: 9 mg/dL (ref 8.4–10.5)
Chloride: 100 mEq/L (ref 96–112)
Creatinine, Ser: 0.73 mg/dL (ref 0.40–1.50)
GFR: 99.09 mL/min (ref >60.00)
Glucose, Bld: 90 mg/dL (ref 70–99)
Potassium: 3.9 mEq/L (ref 3.5–5.1)
Sodium: 138 mEq/L (ref 135–145)
Total Bilirubin: 0.3 mg/dL (ref 0.2–1.2)
Total Protein: 7.2 g/dL (ref 6.0–8.3)

## 2022-12-26 LAB — POCT INR: INR: 1.9 — AB (ref 2.0–3.0)

## 2022-12-26 NOTE — Telephone Encounter (Signed)
I see that this order number was faxed back to them on 12/25/22 the day of the call.. I will close this message out.

## 2022-12-26 NOTE — Progress Notes (Signed)
Anticoagulation Management Gregory May is a 62 y.o. male who reports to the clinic for monitoring of warfarin treatment.    Indication: Retinal artery occlusion, central  Right retinal embolus  Long term (current) use of anticoagulants  Duration: indefinite Supervising physician:  Debe Coder, MD  Anticoagulation Clinic Visit History: Patient does not report signs/symptoms of bleeding or thromboembolism  Other recent changes: No diet, medications, lifestyle changes except as noted in patient findings. Anticoagulation Episode Summary     Current INR goal:  2.0-3.0  TTR:  52.5 % (8 mo)  Next INR check:  01/01/2023  INR from last check:  1.9 (12/26/2022)  Weekly max warfarin dose:    Target end date:    INR check location:    Preferred lab:    Send INR reminders to:     Indications   Pulmonary emboli (HCC) (Resolved) [I26.99] DVT (deep venous thrombosis) (HCC) (Resolved) [I82.409] Retinal artery occlusion central [H34.10] Right retinal embolus [H34.9] Long term (current) use of anticoagulants [Z79.01]        Comments:           No Known Allergies  Current Outpatient Medications:    Accu-Chek FastClix Lancets MISC, Use Accu Chek Fastclix lancets to check blood sugar three times daily. DX:E11.65, Disp: 300 each, Rfl: 2   ACCU-CHEK GUIDE test strip, TEST BLOOD SUGAR THREE TIMES DAILY AS DIRECTED, Disp: 300 strip, Rfl: 3   acetaminophen (TYLENOL) 500 MG tablet, Take 1,000 mg by mouth as needed for mild pain or moderate pain., Disp: , Rfl:    alum & mag hydroxide-simeth (MAALOX/MYLANTA) 200-200-20 MG/5ML suspension, Take 15-30 mLs by mouth every 2 (two) hours as needed for indigestion., Disp: 355 mL, Rfl: 0   APPLE CIDER VINEGAR PO, Take 1 tablet by mouth daily., Disp: , Rfl:    ascorbic acid (VITAMIN C) 1000 MG tablet, Take 1 tablet (1,000 mg total) by mouth daily., Disp: , Rfl:    bisacodyl (DULCOLAX) 5 MG EC tablet, Take 1 tablet (5 mg total) by mouth daily as  needed for moderate constipation., Disp: 30 tablet, Rfl: 0   empagliflozin (JARDIANCE) 25 MG TABS tablet, Take 1 tablet (25 mg total) by mouth daily., Disp: 90 tablet, Rfl: 3   fluticasone (FLONASE) 50 MCG/ACT nasal spray, USE 2 SPRAY(S) IN EACH NOSTRIL AT BEDTIME AS NEEDED FOR  SINUS  DRAINAGE (Patient taking differently: Place 1-2 sprays into both nostrils daily.), Disp: 16 g, Rfl: 0   gabapentin (NEURONTIN) 300 MG capsule, TAKE 1 CAPSULE FOUR TIMES DAILY AS NEEDED (Patient taking differently: Take 600 mg by mouth as needed (pain).), Disp: 360 capsule, Rfl: 0   guaiFENesin-dextromethorphan (ROBITUSSIN DM) 100-10 MG/5ML syrup, Take 15 mLs by mouth every 4 (four) hours as needed for cough., Disp: 118 mL, Rfl: 0   HYDROcodone-acetaminophen (NORCO/VICODIN) 5-325 MG tablet, Take 1 tablet by mouth every 6 (six) hours as needed for moderate pain., Disp: 30 tablet, Rfl: 0   insulin aspart (NOVOLOG) 100 UNIT/ML injection, Inject 0-9 Units into the skin 3 (three) times daily with meals. Correction coverage: Sensitive (thin, NPO, renal)  CBG < 70: Implement Hypoglycemia Standing Orders and refer to Hypoglycemia Standing Orders sidebar report  CBG 70 - 120: 0 units  CBG 121 - 150: 1 unit  CBG 151 - 200: 2 units  CBG 201 - 250: 3 units  CBG 251 - 300: 5 units  CBG 301 - 350: 7 units  CBG 351 - 400 9 units  CBG > 400  10 units, Disp: 10 mL, Rfl: 11   Insulin Syringes, Disposable, U-100 0.5 ML MISC, Use to inject insulin, Disp: 100 each, Rfl: 2   magnesium citrate SOLN, Take 296 mLs (1 Bottle total) by mouth once as needed for severe constipation., Disp: 195 mL, Rfl:    metFORMIN (GLUCOPHAGE-XR) 750 MG 24 hr tablet, TAKE 2 TABLETS EVERY DAY, Disp: 180 tablet, Rfl: 3   Multiple Vitamin (MULTIVITAMIN WITH MINERALS) TABS tablet, Take 1 tablet by mouth every morning. Centrum, Disp: , Rfl:    mupirocin ointment (BACTROBAN) 2 %, Place 1 Application into the nose 2 (two) times daily., Disp: 22 g, Rfl: 0   pantoprazole  (PROTONIX) 40 MG tablet, Take 1 tablet (40 mg total) by mouth daily., Disp: , Rfl:    phenol (CHLORASEPTIC) 1.4 % LIQD, Use as directed 1 spray in the mouth or throat as needed for throat irritation / pain., Disp: , Rfl: 0   pioglitazone (ACTOS) 30 MG tablet, TAKE 1 TABLET EVERY DAY, Disp: 90 tablet, Rfl: 3   polyethylene glycol (MIRALAX / GLYCOLAX) 17 g packet, Take 17 g by mouth daily as needed for mild constipation., Disp: 14 each, Rfl: 0   prednisoLONE acetate (PRED FORTE) 1 % ophthalmic suspension, Place 1 drop into the right eye every evening., Disp: , Rfl:    rosuvastatin (CRESTOR) 20 MG tablet, TAKE 1 TABLET EVERY DAY, Disp: 90 tablet, Rfl: 3   senna-docusate (SENOKOT-S) 8.6-50 MG tablet, Take 1 tablet by mouth at bedtime as needed for mild constipation., Disp: , Rfl:    warfarin (COUMADIN) 5 MG tablet, Take 2 (two) of your 5 mg peach-colored warfarin tablets on Mondays, Wednesdays and Fridays. All OTHER DAYS, (Sundays, Tuesdays, Thursdays and Saturdays) take two and one-half ( 2 & 1/2 tablets.), Disp: 68 tablet, Rfl: 2   zinc sulfate 220 (50 Zn) MG capsule, Take 1 capsule (220 mg total) by mouth daily., Disp: , Rfl:    cephALEXin (KEFLEX) 500 MG capsule, Take 1 capsule (500 mg total) by mouth 3 (three) times daily. (Patient not taking: Reported on 12/26/2022), Disp: 30 capsule, Rfl: 0   ciprofloxacin (CIPRO) 500 MG tablet, Take 1 tablet (500 mg total) by mouth 2 (two) times daily. 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 Date of diagnosis February 08 2014 Duration of anticoagulation: 6 months. End date 08/12/2014.  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 Duration of anticoagulation: 8 months. End date 10/11/2014.  Anticoagulant: Lovenox 120 units daily Switched to Eliquis on 05/25/2014 per patient preference    Pulmonary embolism (HCC)    Sebaceous cyst    on back of neck   Social History   Socioeconomic History  Marital status: Single    Spouse name: Not on file   Number of children: 0   Years of education: Not on file   Highest education level: Not on file  Occupational History   Occupation: disabled  Tobacco Use   Smoking status: Never   Smokeless tobacco: Never  Vaping Use   Vaping Use: Never used  Substance and Sexual Activity   Alcohol use: Not Currently    Comment: beer and mixed drink maybe 5  times a month   Drug use: No   Sexual activity: Never  Other Topics Concern   Not on file  Social History Narrative   Not on file   Social Determinants of Health   Financial Resource Strain: Low Risk  (12/15/2021)   Overall Financial Resource Strain (CARDIA)    Difficulty of Paying Living Expenses: Not hard at all  Food Insecurity: No Food Insecurity (03/29/2022)   Hunger Vital Sign    Worried About Running Out of Food in the Last Year: Never true    Ran Out of Food in the Last Year: Never true  Transportation Needs: No Transportation Needs (03/29/2022)   PRAPARE - Administrator, Civil Service (Medical): No    Lack of Transportation (Non-Medical): No  Physical Activity: Inactive (12/15/2021)   Exercise Vital Sign    Days of Exercise per Week: 0 days    Minutes of Exercise per Session: 0 min  Stress: No Stress Concern Present (12/15/2021)    [employer] of Occupational Health - Occupational Stress Questionnaire    Feeling of Stress : Not at all  Social Connections: Socially Isolated (12/15/2021)   Social Connection and Isolation Panel [NHANES]    Frequency of Communication with Friends and Family: More than three times a week    Frequency of Social Gatherings with Friends and Family: More than three times a week    Attends Religious Services: Never    Database administrator or Organizations: No    Attends Engineer, structural: Never    Marital Status: Never married   Family History  Problem Relation Age of Onset   Breast cancer Mother    Cancer Mother        small intestine   Liver cancer Mother    Diabetes Mother    Diabetes Father    Diabetes Brother    Hypertension Maternal Grandmother    Heart Problems Maternal Grandmother    Diabetes Paternal Grandmother    Diabetes Paternal Grandfather    Diabetes Brother     ASSESSMENT Recent Results: The most recent result is correlated with 7.5 milligrams of warfarin each day since last visit. Dose was empirically reduced because of DDI with cipro that he was placed upon by his orthopedist for a foot wound. He takes his last dose today.   Lab Results  Component Value Date   INR 1.9 (A) 12/26/2022   INR 2.1 12/20/2022   INR 2.0 12/17/2022    Anticoagulation Dosing: Description   Take 2 of your 5mg  strength peach-colored warfarin tablets by mouth, once-daily at 4:00pm.     INR today: Subtherapeutic  PLAN Weekly dose was increased to 2 of his 5 milligram strength warfarin tablets (10mg  dose) each day until seen next in the clinic on Tuesday 21-MAY-24 at 3:00 PM.   Patient Instructions  Patient instructed to take medications as defined in the Anti-coagulation Track section of this encounter.  Patient instructed to take today's dose.  Patient instructed to  take 2 of your 5mg  strength peach-colored warfarin tablets by mouth, once-daily at 4:00pm Patient  verbalized understanding of these instructions.  Patient advised to contact clinic or seek medical attention if signs/symptoms of bleeding or thromboembolism occur.  Patient verbalized understanding by repeating back information and was advised to contact me if further medication-related questions arise. Patient was also provided an information handout.  Follow-up Return in 6 days (on 01/01/2023) for Follow up INR.  Elicia Lamp, PharmD, CPP  15 minutes spent face-to-face with the patient during the encounter. 50% of time spent on education, including signs/sx bleeding and clotting, as well as food and drug interactions with warfarin. 50% of time was spent on fingerprick POC INR sample collection,processing, results determination, and documentation in [email or web address].

## 2022-12-26 NOTE — Patient Instructions (Signed)
Patient instructed to take medications as defined in the Anti-coagulation Track section of this encounter.  Patient instructed to take today's dose.  Patient instructed to take 2 of your 5mg  strength peach-colored warfarin tablets by mouth, once-daily at 4:00pm Patient verbalized understanding of these instructions.

## 2022-12-27 DIAGNOSIS — G629 Polyneuropathy, unspecified: Secondary | ICD-10-CM | POA: Diagnosis not present

## 2022-12-27 DIAGNOSIS — L03119 Cellulitis of unspecified part of limb: Secondary | ICD-10-CM | POA: Diagnosis not present

## 2022-12-27 DIAGNOSIS — E7800 Pure hypercholesterolemia, unspecified: Secondary | ICD-10-CM | POA: Diagnosis not present

## 2022-12-27 DIAGNOSIS — M160 Bilateral primary osteoarthritis of hip: Secondary | ICD-10-CM | POA: Diagnosis not present

## 2022-12-27 DIAGNOSIS — G4733 Obstructive sleep apnea (adult) (pediatric): Secondary | ICD-10-CM | POA: Diagnosis not present

## 2022-12-27 DIAGNOSIS — K760 Fatty (change of) liver, not elsewhere classified: Secondary | ICD-10-CM | POA: Diagnosis not present

## 2022-12-27 DIAGNOSIS — H3411 Central retinal artery occlusion, right eye: Secondary | ICD-10-CM | POA: Diagnosis not present

## 2022-12-27 DIAGNOSIS — Z4781 Encounter for orthopedic aftercare following surgical amputation: Secondary | ICD-10-CM | POA: Diagnosis not present

## 2022-12-27 DIAGNOSIS — E1142 Type 2 diabetes mellitus with diabetic polyneuropathy: Secondary | ICD-10-CM | POA: Diagnosis not present

## 2022-12-27 NOTE — Progress Notes (Signed)
Evaluation and management procedures were performed by the Clinical Pharmacy Practitioner under my supervision and collaboration. I have reviewed the Practitioner's note and chart, and I agree with the management and plan as documented above. ° °

## 2022-12-28 LAB — FRUCTOSAMINE: Fructosamine: 218 umol/L (ref 0–285)

## 2022-12-31 ENCOUNTER — Ambulatory Visit (INDEPENDENT_AMBULATORY_CARE_PROVIDER_SITE_OTHER): Payer: Medicare HMO | Admitting: Orthopedic Surgery

## 2022-12-31 ENCOUNTER — Ambulatory Visit (INDEPENDENT_AMBULATORY_CARE_PROVIDER_SITE_OTHER): Payer: Medicare HMO | Admitting: Endocrinology

## 2022-12-31 ENCOUNTER — Encounter: Payer: Self-pay | Admitting: Endocrinology

## 2022-12-31 ENCOUNTER — Encounter: Payer: Self-pay | Admitting: Orthopedic Surgery

## 2022-12-31 VITALS — BP 128/72 | HR 70 | Ht 72.0 in | Wt 187.0 lb

## 2022-12-31 DIAGNOSIS — Z89512 Acquired absence of left leg below knee: Secondary | ICD-10-CM | POA: Diagnosis not present

## 2022-12-31 DIAGNOSIS — H3411 Central retinal artery occlusion, right eye: Secondary | ICD-10-CM | POA: Diagnosis not present

## 2022-12-31 DIAGNOSIS — M160 Bilateral primary osteoarthritis of hip: Secondary | ICD-10-CM | POA: Diagnosis not present

## 2022-12-31 DIAGNOSIS — E1165 Type 2 diabetes mellitus with hyperglycemia: Secondary | ICD-10-CM | POA: Diagnosis not present

## 2022-12-31 DIAGNOSIS — G4733 Obstructive sleep apnea (adult) (pediatric): Secondary | ICD-10-CM | POA: Diagnosis not present

## 2022-12-31 DIAGNOSIS — Z794 Long term (current) use of insulin: Secondary | ICD-10-CM

## 2022-12-31 DIAGNOSIS — E1142 Type 2 diabetes mellitus with diabetic polyneuropathy: Secondary | ICD-10-CM | POA: Diagnosis not present

## 2022-12-31 DIAGNOSIS — E7800 Pure hypercholesterolemia, unspecified: Secondary | ICD-10-CM | POA: Diagnosis not present

## 2022-12-31 DIAGNOSIS — K760 Fatty (change of) liver, not elsewhere classified: Secondary | ICD-10-CM | POA: Diagnosis not present

## 2022-12-31 DIAGNOSIS — L03116 Cellulitis of left lower limb: Secondary | ICD-10-CM | POA: Diagnosis not present

## 2022-12-31 DIAGNOSIS — Z4781 Encounter for orthopedic aftercare following surgical amputation: Secondary | ICD-10-CM | POA: Diagnosis not present

## 2022-12-31 MED ORDER — HYDROCODONE-ACETAMINOPHEN 5-325 MG PO TABS: 1.0000 | ORAL_TABLET | Freq: Four times a day (QID) | ORAL | 0 refills | Status: DC | PRN
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
## 2022-12-31 NOTE — Patient Instructions (Addendum)
Check blood sugars on waking up 3 days a week  Also check blood sugars about 2 hours after meals and do this after different meals by rotation  Recommended blood sugar levels on waking up are 80-120 and about 2 hours after meal is 130-160  Please bring your blood sugar monitor to each visit, thank you  

## 2022-12-31 NOTE — Progress Notes (Addendum)
Office Visit Note   Patient: Gregory May           Date of Birth: [birth year]           MRN: [id number] Visit Date: 12/31/2022              Requested by: Gwenevere Abbot, MD [address] [place],  [place] [address] PCP: Gwenevere Abbot, MD  Chief Complaint  Patient presents with   Left Leg - Follow-up    Left below knee amputation 09/29/2022      HPI:  Patient is a 62 year old gentleman who is 2 months status post left below-knee amputation.  Patient did have some green drainage and was on Cipro he states that he has completed the Cipro and the drainage is stopped.  She  Assessment & Plan: Visit Diagnoses:  1. Left below-knee amputee [facility])     Plan: Patient will continue with wound care dressing changes refill prescription for Vicodin.  Follow-Up Instructions: Return in about 4 weeks (around 01/28/2023).   Ortho Exam  Patient is alert, oriented, no adenopathy, well-dressed, normal affect, normal respiratory effort. Examination the skin maceration is resolved there is good wrinkling of the skin there is healthy granulation tissue.  Patient is a new left transtibial  amputee.  Patient's current comorbidities are not expected to impact the ability to function with the prescribed prosthesis. Patient verbally communicates a strong desire to use a prosthesis. Patient currently requires mobility aids to ambulate without a prosthesis.  Expects not to use mobility aids with a new prosthesis.  Patient is a K3 level ambulator that spends a lot of time walking around on uneven terrain over obstacles, up and down stairs, and ambulates with a variable cadence.     Imaging: No results found.    Labs: Lab Results  Component Value Date   HGBA1C 6.9 (H) 09/24/2022   HGBA1C 7.2 (H) 03/08/2022   HGBA1C 7.2 (H) 11/29/2021   ESRSEDRATE 11 11/15/2021   ESRSEDRATE 25 (H) 08/19/2021   ESRSEDRATE 8 07/15/2019   CRP 0.7 11/17/2021   CRP <0.5 11/15/2021   CRP 1.7 (H)  08/19/2021   REPTSTATUS 10/03/2022 FINAL 09/28/2022   REPTSTATUS 10/03/2022 FINAL 09/28/2022   GRAMSTAIN  08/21/2021    WBC PRESENT,BOTH PMN AND MONONUCLEAR NO ORGANISMS SEEN    CULT  09/28/2022    NO GROWTH 5 DAYS Performed at [facility], [address] [place], [place] [address]    CULT  09/28/2022    NO GROWTH 5 DAYS Performed at [facility], [address], [place], [place] [address]    [facility] STAPHYLOCOCCUS HOMINIS 08/21/2021     Lab Results  Component Value Date   ALBUMIN 3.8 12/26/2022   ALBUMIN 2.8 (L) 09/28/2022   ALBUMIN 4.1 09/24/2022    Lab Results  Component Value Date   MG 1.9 11/15/2021   Lab Results  Component Value Date   VD25OH 16 (L) 11/05/2013    No results found for: "PREALBUMIN"    Latest Ref Rng & Units 10/03/2022    2:13 AM 10/01/2022    5:02 AM 09/30/2022    4:06 AM  CBC EXTENDED  WBC 4.0 - 10.5 K/uL 12.8  12.6  14.0   RBC 4.22 - 5.81 MIL/uL 4.26  4.25  3.84   Hemoglobin 13.0 - 17.0 g/dL 21.3  08.6  57.8   HCT 39.0 - 52.0 % 39.1  39.4  36.3   Platelets 150 - 400 K/uL 413  369  325      There is no height or weight on file to calculate BMI.  Orders:  No orders of the defined types were placed in this encounter.  Meds ordered this encounter  Medications   HYDROcodone-acetaminophen (NORCO/VICODIN) 5-325 MG tablet    Sig: Take 1 tablet by mouth every 6 (six) hours as needed for moderate pain.    Dispense:  30 tablet    Refill:  0     Procedures: No procedures performed  Clinical Data: No additional findings.  ROS:  All other systems negative, except as noted in the HPI. Review of Systems  Objective: Vital Signs: There were no vitals taken for this visit.  Specialty Comments:  No specialty comments available.  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) 10/03/2022   Lower extremity cellulitis 09/29/2022    Acute osteomyelitis of left foot (HCC) 09/29/2022   PFO with atrial septal aneurysm    Long term (current) use of anticoagulants 02/26/2022   Right retinal embolus 01/15/2022   DM type 2 with diabetic peripheral neuropathy (HCC) 01/15/2022   Obstructive sleep apnea 01/15/2022   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   Transportation insecurity 11/23/2021   Retinal artery occlusion, central 11/15/2021   Epidermal inclusion cyst 10/18/2021   Amputated finger 09/05/2021   Osteomyelitis of finger of right hand (HCC) 08/19/2021   Hepatic steatosis 06/24/2020   Back pain 04/20/2020   Left knee pain 04/20/2020   Allergic sinusitis 04/20/2020   Elevated liver enzymes 04/20/2020   Cutaneous abscess of left foot    Diastasis of rectus abdominis 07/16/2019   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 10/07/2018   Essential hypertension 06/19/2018   Peripheral neuropathy 12/19/2017   Left below-knee amputee (HCC) 04/17/2017   Carpal tunnel syndrome 03/18/2017   Healthcare maintenance 06/21/2015   Status post below knee amputation of right lower extremity (HCC) 06/07/2015   Diabetes mellitus with carpal tunnel syndrome (HCC) 12/09/2014   DJD (degenerative joint disease) of knee 07/21/2014   Osteoarthritis, hip, bilateral 05/25/2014   Onychomycosis 10/14/2013   Pure hypercholesterolemia 09/25/2013   Type 2 diabetes with complication (HCC) 09/15/1997   Past Medical History:  Diagnosis Date   Arthritis    bilateral hips   Cutaneous abscess of left foot    Deep vein thrombosis (DVT) (HCC)    Diabetes mellitus    type II   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 Date of diagnosis February 08 2014 Duration of anticoagulation: 6 months. End date 08/12/2014.  Anticoagulant: Lovenox 120 units daily Switched to Eliquis on 05/25/2014      Ear drum  perforation, right 03/06/2019   Non-pressure chronic ulcer of right calf, limited to breakdown of skin (HCC) 04/17/2017   Pulmonary emboli (HCC) 02/08/2014   Date of diagnosis February 08 2014, on chest CTA Hospitalized for 3 days Had some symptoms of shortness of breath, and chest pain With intercurrent DVT of the left LE. Duration of anticoagulation: 8 months. End date 10/11/2014.  Anticoagulant: Lovenox 120 units daily Switched to Eliquis on 05/25/2014 per patient preference    Pulmonary embolism (HCC)    Sebaceous cyst    on back of neck    Family History  Problem Relation Age of  Onset   Breast cancer Mother    Cancer Mother        small intestine   Liver cancer Mother    Diabetes Mother    Diabetes Father    Diabetes Brother    Hypertension Maternal Grandmother    Heart Problems Maternal Grandmother    Diabetes Paternal Grandmother    Diabetes Paternal Grandfather    Diabetes Brother     Past Surgical History:  Procedure Laterality Date   AMPUTATION Right 06/03/2015   Procedure: Right Below Knee Amputation;  Surgeon: Nadara Mustard, MD;  Location: [facility] OR;  Service: Orthopedics;  Laterality: Right;   AMPUTATION Left 07/24/2019   Procedure: LEFT FOOT 3RD RAY AMPUTATION;  Surgeon: Nadara Mustard, MD;  Location: [facility] OR;  Service: Orthopedics;  Laterality: Left;   AMPUTATION Left 06/23/2021   Procedure: LEFT 2ND TOE AMPUTATION;  Surgeon: Nadara Mustard, MD;  Location: [facility] OR;  Service: Orthopedics;  Laterality: Left;   AMPUTATION Right 08/21/2021   Procedure: AMPUTATION RIGHT INDEX FINGER;  Surgeon: Gomez Cleverly, MD;  Location: [facility] OR;  Service: Orthopedics;  Laterality: Right;   AMPUTATION Left 09/29/2022   Procedure: AMPUTATION BELOW KNEE;  Surgeon: Nadara Mustard, MD;  Location: [facility] OR;  Service: Orthopedics;  Laterality: Left;   BUBBLE STUDY  01/24/2022   Procedure: BUBBLE STUDY;  Surgeon: Wendall Stade, MD;  Location: [facility] ENDOSCOPY;  Service: Cardiovascular;;   CLOSED REDUCTION WITH  HUMER PIN INSERTION  1974   left hip   HARDWARE REMOVAL Left 07/21/2014   Procedure: HARDWARE REMOVAL;  Surgeon: Loreta Ave, MD;  Location: [facility] OR;  Service: Orthopedics;  Laterality: Left;   I & D EXTREMITY Right 08/21/2021   Procedure: IRRIGATION AND DEBRIDEMENT RIGHT INDEX FINGER;  Surgeon: Gomez Cleverly, MD;  Location: [facility] OR;  Service: Orthopedics;  Laterality: Right;   PATENT FORAMEN OVALE(PFO) CLOSURE N/A 02/28/2022   Procedure: PATENT FORAMEN OVALE(PFO) CLOSURE;  Surgeon: Tonny Bollman, MD;  Location: [facility] INVASIVE CV LAB;  Service: Cardiovascular;  Laterality: N/A;   ROTATOR CUFF REPAIR Right 2005 (approx)   TEE WITHOUT CARDIOVERSION N/A 01/24/2022   Procedure: TRANSESOPHAGEAL ECHOCARDIOGRAM (TEE);  Surgeon: Wendall Stade, MD;  Location: [facility] ENDOSCOPY;  Service: Cardiovascular;  Laterality: N/A;   TOTAL HIP ARTHROPLASTY Left 07/21/2014   Procedure: TOTAL HIP ARTHROPLASTY ANTERIOR APPROACH;  Surgeon: Loreta Ave, MD;  Location: [facility] OR;  Service: Orthopedics;  Laterality: Left;   TOTAL HIP ARTHROPLASTY Right 2006 (approx)   right hip replaced   Social History   Occupational History   Occupation: disabled  Tobacco Use   Smoking status: Never   Smokeless tobacco: Never  Vaping Use   Vaping Use: Never used  Substance and Sexual Activity   Alcohol use: Not Currently    Comment: beer and mixed drink maybe 5  times a month   Drug use: No   Sexual activity: Never

## 2023-01-01 ENCOUNTER — Ambulatory Visit (INDEPENDENT_AMBULATORY_CARE_PROVIDER_SITE_OTHER): Payer: Medicare HMO | Admitting: Pharmacist

## 2023-01-01 DIAGNOSIS — L03116 Cellulitis of left lower limb: Secondary | ICD-10-CM | POA: Diagnosis not present

## 2023-01-01 DIAGNOSIS — H349 Unspecified retinal vascular occlusion: Secondary | ICD-10-CM | POA: Diagnosis not present

## 2023-01-01 DIAGNOSIS — E1142 Type 2 diabetes mellitus with diabetic polyneuropathy: Secondary | ICD-10-CM | POA: Diagnosis not present

## 2023-01-01 DIAGNOSIS — M160 Bilateral primary osteoarthritis of hip: Secondary | ICD-10-CM | POA: Diagnosis not present

## 2023-01-01 DIAGNOSIS — H3411 Central retinal artery occlusion, right eye: Secondary | ICD-10-CM | POA: Diagnosis not present

## 2023-01-01 DIAGNOSIS — K760 Fatty (change of) liver, not elsewhere classified: Secondary | ICD-10-CM | POA: Diagnosis not present

## 2023-01-01 DIAGNOSIS — G4733 Obstructive sleep apnea (adult) (pediatric): Secondary | ICD-10-CM | POA: Diagnosis not present

## 2023-01-01 DIAGNOSIS — Z7901 Long term (current) use of anticoagulants: Secondary | ICD-10-CM

## 2023-01-01 DIAGNOSIS — Z4781 Encounter for orthopedic aftercare following surgical amputation: Secondary | ICD-10-CM | POA: Diagnosis not present

## 2023-01-01 DIAGNOSIS — Z89512 Acquired absence of left leg below knee: Secondary | ICD-10-CM | POA: Diagnosis not present

## 2023-01-01 DIAGNOSIS — E7800 Pure hypercholesterolemia, unspecified: Secondary | ICD-10-CM | POA: Diagnosis not present

## 2023-01-01 LAB — POCT INR: INR: 2.1 (ref 2.0–3.0)

## 2023-01-01 NOTE — Progress Notes (Signed)
Anticoagulation Management Gregory May is a 62 y.o. male who reports to the clinic for monitoring of warfarin treatment.    Indication: Retinal artery occlusion, central Right retinal embolus Long term (current) use of anticoagulants.   Duration: indefinite Supervising physician: Earl Lagos  Anticoagulation Clinic Visit History: Patient does not report signs/symptoms of bleeding or thromboembolism  Other recent changes: No diet, medications, lifestyle changes except as noted in patient findings.  Anticoagulation Episode Summary     Current INR goal:  2.0-3.0  TTR:  52.4 % (8.2 mo)  Next INR check:  01/08/2023  INR from last check:  2.1 (01/01/2023)  Weekly max warfarin dose:    Target end date:    INR check location:    Preferred lab:    Send INR reminders to:     Indications   Pulmonary emboli (HCC) (Resolved) [I26.99] DVT (deep venous thrombosis) (HCC) (Resolved) [I82.409] Retinal artery occlusion central [H34.10] Right retinal embolus [H34.9] Long term (current) use of anticoagulants [Z79.01]        Comments:           No Known Allergies  Current Outpatient Medications:    Accu-Chek FastClix Lancets MISC, Use Accu Chek Fastclix lancets to check blood sugar three times daily. DX:E11.65, Disp: 300 each, Rfl: 2   ACCU-CHEK GUIDE test strip, TEST BLOOD SUGAR THREE TIMES DAILY AS DIRECTED, Disp: 300 strip, Rfl: 3   acetaminophen (TYLENOL) 500 MG tablet, Take 1,000 mg by mouth as needed for mild pain or moderate pain., Disp: , Rfl:    alum & mag hydroxide-simeth (MAALOX/MYLANTA) 200-200-20 MG/5ML suspension, Take 15-30 mLs by mouth every 2 (two) hours as needed for indigestion., Disp: 355 mL, Rfl: 0   APPLE CIDER VINEGAR PO, Take 1 tablet by mouth daily., Disp: , Rfl:    ascorbic acid (VITAMIN C) 1000 MG tablet, Take 1 tablet (1,000 mg total) by mouth daily., Disp: , Rfl:    bisacodyl (DULCOLAX) 5 MG EC tablet, Take 1 tablet (5 mg total) by mouth daily as  needed for moderate constipation., Disp: 30 tablet, Rfl: 0   empagliflozin (JARDIANCE) 25 MG TABS tablet, Take 1 tablet (25 mg total) by mouth daily., Disp: 90 tablet, Rfl: 3   fluticasone (FLONASE) 50 MCG/ACT nasal spray, USE 2 SPRAY(S) IN EACH NOSTRIL AT BEDTIME AS NEEDED FOR  SINUS  DRAINAGE (Patient taking differently: Place 1-2 sprays into both nostrils daily.), Disp: 16 g, Rfl: 0   gabapentin (NEURONTIN) 300 MG capsule, TAKE 1 CAPSULE FOUR TIMES DAILY AS NEEDED (Patient taking differently: Take 600 mg by mouth as needed (pain).), Disp: 360 capsule, Rfl: 0   guaiFENesin-dextromethorphan (ROBITUSSIN DM) 100-10 MG/5ML syrup, Take 15 mLs by mouth every 4 (four) hours as needed for cough., Disp: 118 mL, Rfl: 0   HYDROcodone-acetaminophen (NORCO/VICODIN) 5-325 MG tablet, Take 1 tablet by mouth every 6 (six) hours as needed for moderate pain., Disp: 30 tablet, Rfl: 0   insulin aspart (NOVOLOG) 100 UNIT/ML injection, Inject 0-9 Units into the skin 3 (three) times daily with meals. Correction coverage: Sensitive (thin, NPO, renal)  CBG < 70: Implement Hypoglycemia Standing Orders and refer to Hypoglycemia Standing Orders sidebar report  CBG 70 - 120: 0 units  CBG 121 - 150: 1 unit  CBG 151 - 200: 2 units  CBG 201 - 250: 3 units  CBG 251 - 300: 5 units  CBG 301 - 350: 7 units  CBG 351 - 400 9 units  CBG > 400  10 units,  Disp: 10 mL, Rfl: 11   Insulin Syringes, Disposable, U-100 0.5 ML MISC, Use to inject insulin, Disp: 100 each, Rfl: 2   magnesium citrate SOLN, Take 296 mLs (1 Bottle total) by mouth once as needed for severe constipation., Disp: 195 mL, Rfl:    metFORMIN (GLUCOPHAGE-XR) 750 MG 24 hr tablet, TAKE 2 TABLETS EVERY DAY, Disp: 180 tablet, Rfl: 3   Multiple Vitamin (MULTIVITAMIN WITH MINERALS) TABS tablet, Take 1 tablet by mouth every morning. Centrum, Disp: , Rfl:    mupirocin ointment (BACTROBAN) 2 %, Place 1 Application into the nose 2 (two) times daily., Disp: 22 g, Rfl: 0   pantoprazole  (PROTONIX) 40 MG tablet, Take 1 tablet (40 mg total) by mouth daily., Disp: , Rfl:    phenol (CHLORASEPTIC) 1.4 % LIQD, Use as directed 1 spray in the mouth or throat as needed for throat irritation / pain., Disp: , Rfl: 0   pioglitazone (ACTOS) 30 MG tablet, TAKE 1 TABLET EVERY DAY, Disp: 90 tablet, Rfl: 3   polyethylene glycol (MIRALAX / GLYCOLAX) 17 g packet, Take 17 g by mouth daily as needed for mild constipation., Disp: 14 each, Rfl: 0   prednisoLONE acetate (PRED FORTE) 1 % ophthalmic suspension, Place 1 drop into the right eye every evening., Disp: , Rfl:    rosuvastatin (CRESTOR) 20 MG tablet, TAKE 1 TABLET EVERY DAY, Disp: 90 tablet, Rfl: 3   senna-docusate (SENOKOT-S) 8.6-50 MG tablet, Take 1 tablet by mouth at bedtime as needed for mild constipation., Disp: , Rfl:    warfarin (COUMADIN) 5 MG tablet, Take 2 (two) of your 5 mg peach-colored warfarin tablets on Mondays, Wednesdays and Fridays. All OTHER DAYS, (Sundays, Tuesdays, Thursdays and Saturdays) take two and one-half ( 2 & 1/2 tablets.), Disp: 68 tablet, Rfl: 2   zinc sulfate 220 (50 Zn) MG capsule, Take 1 capsule (220 mg total) by mouth daily., Disp: , Rfl:    cephALEXin (KEFLEX) 500 MG capsule, Take 1 capsule (500 mg total) by mouth 3 (three) times daily. (Patient not taking: Reported on 01/01/2023), Disp: 30 capsule, Rfl: 0   ciprofloxacin (CIPRO) 500 MG tablet, Take 1 tablet (500 mg total) by mouth 2 (two) times daily. (Patient not taking: Reported on 01/01/2023), Disp: 20 tablet, Rfl: 0 Past Medical History:  Diagnosis Date   Arthritis    bilateral hips   Cutaneous abscess of left foot    Deep vein thrombosis (DVT) (HCC)    Diabetes mellitus    type II   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 Date of diagnosis February 08 2014 Duration of anticoagulation: 6 months. End date 08/12/2014.  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 Duration of anticoagulation: 8 months. End date 10/11/2014.  Anticoagulant: Lovenox 120 units daily Switched to Eliquis on 05/25/2014 per patient preference    Pulmonary embolism (HCC)    Sebaceous cyst    on back of neck   Social History   Socioeconomic History   Marital  status: Single    Spouse name: Not on file   Number of children: 0   Years of education: Not on file   Highest education level: Not on file  Occupational History   Occupation: disabled  Tobacco Use   Smoking status: Never   Smokeless tobacco: Never  Vaping Use   Vaping Use: Never used  Substance and Sexual Activity   Alcohol use: Not Currently    Comment: beer and mixed drink maybe 5  times a month   Drug use: No   Sexual activity: Never  Other Topics Concern   Not on file  Social History Narrative   Not on file   Social Determinants of Health   Financial Resource Strain: Low Risk  (12/15/2021)   Overall Financial Resource Strain (CARDIA)    Difficulty of Paying Living Expenses: Not hard at all  Food Insecurity: No Food Insecurity (03/29/2022)   Hunger Vital Sign    Worried About Running Out of Food in the Last Year: Never true    Ran Out of Food in the Last Year: Never true  Transportation Needs: No Transportation Needs (03/29/2022)   PRAPARE - Administrator, Civil Service (Medical): No    Lack of Transportation (Non-Medical): No  Physical Activity: Inactive (12/15/2021)   Exercise Vital Sign    Days of Exercise per Week: 0 days    Minutes of Exercise per Session: 0 min  Stress: No Stress Concern Present (12/15/2021)    [employer] of Occupational Health - Occupational Stress Questionnaire    Feeling of Stress : Not at all  Social Connections: Socially Isolated (12/15/2021)   Social Connection and Isolation Panel [NHANES]    Frequency of Communication with Friends and Family: More than three times a week    Frequency of Social Gatherings with Friends and Family: More than three times a week    Attends Religious Services: Never    Database administrator or Organizations: No    Attends Engineer, structural: Never    Marital Status: Never married   Family History  Problem Relation Age of Onset   Breast cancer Mother    Cancer Mother        small intestine   Liver cancer Mother    Diabetes Mother    Diabetes Father    Diabetes Brother    Hypertension Maternal Grandmother    Heart Problems Maternal Grandmother    Diabetes Paternal Grandmother    Diabetes Paternal Grandfather    Diabetes Brother     ASSESSMENT Recent Results: The most recent result is correlated with 65 mg per week: Lab Results  Component Value Date   INR 2.1 01/01/2023   INR 1.9 (A) 12/26/2022   INR 2.1 12/20/2022    Anticoagulation Dosing: Description   Take 2 of your 5mg  strength peach-colored warfarin tablets by mouth, once-daily at 4:00pm on Sundays, Mondays, Wednesdays, and Fridays. On Tuesdays, Thursdays and Saturdays, take 2 and 1/2 of your tablets.      INR today: Therapeutic  PLAN Weekly dose was  increased by  29 % to 77.5 mg per week  Patient Instructions  Patient instructed to take medications as defined in the Anti-coagulation Track section of this encounter.  Patient instructed to take today's dose.  Patient instructed to take 2 of your 5mg  strength peach-colored warfarin tablets by mouth, once-daily at 4:00pm on Sundays, Mondays, Wednesdays, and Fridays. On Tuesdays, Thursdays and Saturdays, take 2 and 1/2 of your tablets.  Patient verbalized understanding of these instructions.  Patient  advised to contact clinic or seek medical attention if signs/symptoms of bleeding or thromboembolism occur.  Patient verbalized understanding by repeating back information and was advised to contact me if further medication-related questions arise. Patient was also provided an information handout.  Follow-up Return in 1 week (on 01/08/2023) for Follow up INR.  Elicia Lamp, PharmD, CPP  15 minutes spent face-to-face with the patient during the encounter. 50% of time spent on education, including signs/sx bleeding and clotting, as well as food and drug interactions with warfarin. 50% of time was spent on fingerprick POC INR sample collection,processing, results determination, and documentation in [email or web address].

## 2023-01-01 NOTE — Patient Instructions (Signed)
Patient instructed to take medications as defined in the Anti-coagulation Track section of this encounter.  Patient instructed to take today's dose.  Patient instructed to take 2 of your 5mg  strength peach-colored warfarin tablets by mouth, once-daily at 4:00pm on Sundays, Mondays, Wednesdays, and Fridays. On Tuesdays, Thursdays and Saturdays, take 2 and 1/2 of your tablets.  Patient verbalized understanding of these instructions.

## 2023-01-03 DIAGNOSIS — Z4781 Encounter for orthopedic aftercare following surgical amputation: Secondary | ICD-10-CM | POA: Diagnosis not present

## 2023-01-03 DIAGNOSIS — E1142 Type 2 diabetes mellitus with diabetic polyneuropathy: Secondary | ICD-10-CM | POA: Diagnosis not present

## 2023-01-03 DIAGNOSIS — G4733 Obstructive sleep apnea (adult) (pediatric): Secondary | ICD-10-CM | POA: Diagnosis not present

## 2023-01-03 DIAGNOSIS — E7800 Pure hypercholesterolemia, unspecified: Secondary | ICD-10-CM | POA: Diagnosis not present

## 2023-01-03 DIAGNOSIS — K760 Fatty (change of) liver, not elsewhere classified: Secondary | ICD-10-CM | POA: Diagnosis not present

## 2023-01-03 DIAGNOSIS — M160 Bilateral primary osteoarthritis of hip: Secondary | ICD-10-CM | POA: Diagnosis not present

## 2023-01-03 DIAGNOSIS — H3411 Central retinal artery occlusion, right eye: Secondary | ICD-10-CM | POA: Diagnosis not present

## 2023-01-03 DIAGNOSIS — Z89512 Acquired absence of left leg below knee: Secondary | ICD-10-CM | POA: Diagnosis not present

## 2023-01-03 DIAGNOSIS — L03116 Cellulitis of left lower limb: Secondary | ICD-10-CM | POA: Diagnosis not present

## 2023-01-04 ENCOUNTER — Ambulatory Visit: Payer: Self-pay

## 2023-01-04 NOTE — Patient Outreach (Signed)
  Care Coordination   Initial Visit Note   01/04/2023 Name: Gregory May MRN: [id number] DOB: Oct 29, 1960  Gregory May is a 62 y.o. year old male who sees Gregory Abbot, MD for primary care. I spoke with  Gregory May by phone today.  What matters to the patients health and wellness today?  The patient stated that he does not feel he needs any assistance at this time.    Goals Addressed             This Visit's Progress    COMPLETED: Care Coordination Activites - No follow up required        Care Coordination Interventions: Active listening / Reflection utilized  Emotional Support Provided Problem Solving /Task Center strategies reviewed Discussed/.Educated Care Coordination Program 2.   Discussed/.Educated Annual Wellness Visit 3.   Discussed/.Educated Social Determinates of Health 4.   Please inform PCP if services needed in the future         SDOH assessments and interventions completed:  No     Care Coordination Interventions:  Yes, provided   Interventions Today    Flowsheet Row Most Recent Value  General Interventions   General Interventions Discussed/Reviewed General Interventions Discussed  [Care Coordination Program]         Follow up plan: No further intervention required.   Encounter Outcome:  Pt. Visit Completed   Gregory Fairly RN, BSN, [facility] Care Coordinator [facility]   Phone: [phone]

## 2023-01-08 ENCOUNTER — Ambulatory Visit (INDEPENDENT_AMBULATORY_CARE_PROVIDER_SITE_OTHER): Payer: Medicare HMO | Admitting: Pharmacist

## 2023-01-08 DIAGNOSIS — H349 Unspecified retinal vascular occlusion: Secondary | ICD-10-CM

## 2023-01-08 DIAGNOSIS — I63412 Cerebral infarction due to embolism of left middle cerebral artery: Secondary | ICD-10-CM | POA: Diagnosis not present

## 2023-01-08 DIAGNOSIS — E1142 Type 2 diabetes mellitus with diabetic polyneuropathy: Secondary | ICD-10-CM | POA: Diagnosis not present

## 2023-01-08 DIAGNOSIS — Z4781 Encounter for orthopedic aftercare following surgical amputation: Secondary | ICD-10-CM | POA: Diagnosis not present

## 2023-01-08 DIAGNOSIS — Z7901 Long term (current) use of anticoagulants: Secondary | ICD-10-CM

## 2023-01-08 DIAGNOSIS — E7800 Pure hypercholesterolemia, unspecified: Secondary | ICD-10-CM | POA: Diagnosis not present

## 2023-01-08 DIAGNOSIS — Z89512 Acquired absence of left leg below knee: Secondary | ICD-10-CM | POA: Diagnosis not present

## 2023-01-08 DIAGNOSIS — Z8673 Personal history of transient ischemic attack (TIA), and cerebral infarction without residual deficits: Secondary | ICD-10-CM | POA: Diagnosis not present

## 2023-01-08 DIAGNOSIS — K760 Fatty (change of) liver, not elsewhere classified: Secondary | ICD-10-CM | POA: Diagnosis not present

## 2023-01-08 DIAGNOSIS — H3411 Central retinal artery occlusion, right eye: Secondary | ICD-10-CM | POA: Diagnosis not present

## 2023-01-08 DIAGNOSIS — M160 Bilateral primary osteoarthritis of hip: Secondary | ICD-10-CM | POA: Diagnosis not present

## 2023-01-08 DIAGNOSIS — G4733 Obstructive sleep apnea (adult) (pediatric): Secondary | ICD-10-CM | POA: Diagnosis not present

## 2023-01-08 DIAGNOSIS — L03116 Cellulitis of left lower limb: Secondary | ICD-10-CM | POA: Diagnosis not present

## 2023-01-08 LAB — POCT INR: INR: 2.2 (ref 2.0–3.0)

## 2023-01-08 NOTE — Patient Instructions (Signed)
Patient instructed to take medications as defined in the Anti-coagulation Track section of this encounter.  Patient instructed to take today's dose.  Patient instructed to take 2 of your 5mg  strength peach-colored warfarin tablets by mouth, once-daily at 4:00pm on Mondays and Fridays. On Sundays, Tuesdays, Wednesdays, Thursdays and Saturdays, take 2 and 1/2 of your tablets.  Patient verbalized understanding of these instructions.

## 2023-01-08 NOTE — Progress Notes (Signed)
Anticoagulation Management Gregory May is a 62 y.o. male who reports to the clinic for monitoring of warfarin treatment.    Indication: Retinal artery occlusion, central  Right retinal embolus  Long term (current) use of anticoagulants   Duration: indefinite Supervising physician: Earl Lagos  Anticoagulation Clinic Visit History: Patient does not report signs/symptoms of bleeding or thromboembolism  Other recent changes: No diet, medications, lifestyle changes.  Anticoagulation Episode Summary     Current INR goal:  2.0-3.0  TTR:  53.7 % (8.5 mo)  Next INR check:  01/21/2023  INR from last check:  2.2 (01/08/2023)  Weekly max warfarin dose:    Target end date:    INR check location:    Preferred lab:    Send INR reminders to:     Indications   Pulmonary emboli (HCC) (Resolved) [I26.99] DVT (deep venous thrombosis) (HCC) (Resolved) [I82.409] Retinal artery occlusion central [H34.10] Right retinal embolus [H34.9] Long term (current) use of anticoagulants [Z79.01]        Comments:           No Known Allergies  Current Outpatient Medications:    Accu-Chek FastClix Lancets MISC, Use Accu Chek Fastclix lancets to check blood sugar three times daily. DX:E11.65, Disp: 300 each, Rfl: 2   ACCU-CHEK GUIDE test strip, TEST BLOOD SUGAR THREE TIMES DAILY AS DIRECTED, Disp: 300 strip, Rfl: 3   acetaminophen (TYLENOL) 500 MG tablet, Take 1,000 mg by mouth as needed for mild pain or moderate pain., Disp: , Rfl:    alum & mag hydroxide-simeth (MAALOX/MYLANTA) 200-200-20 MG/5ML suspension, Take 15-30 mLs by mouth every 2 (two) hours as needed for indigestion., Disp: 355 mL, Rfl: 0   APPLE CIDER VINEGAR PO, Take 1 tablet by mouth daily., Disp: , Rfl:    ascorbic acid (VITAMIN C) 1000 MG tablet, Take 1 tablet (1,000 mg total) by mouth daily., Disp: , Rfl:    bisacodyl (DULCOLAX) 5 MG EC tablet, Take 1 tablet (5 mg total) by mouth daily as needed for moderate constipation.,  Disp: 30 tablet, Rfl: 0   cephALEXin (KEFLEX) 500 MG capsule, Take 1 capsule (500 mg total) by mouth 3 (three) times daily., Disp: 30 capsule, Rfl: 0   ciprofloxacin (CIPRO) 500 MG tablet, Take 1 tablet (500 mg total) by mouth 2 (two) times daily., Disp: 20 tablet, Rfl: 0   empagliflozin (JARDIANCE) 25 MG TABS tablet, Take 1 tablet (25 mg total) by mouth daily., Disp: 90 tablet, Rfl: 3   fluticasone (FLONASE) 50 MCG/ACT nasal spray, USE 2 SPRAY(S) IN EACH NOSTRIL AT BEDTIME AS NEEDED FOR  SINUS  DRAINAGE (Patient taking differently: Place 1-2 sprays into both nostrils daily.), Disp: 16 g, Rfl: 0   gabapentin (NEURONTIN) 300 MG capsule, TAKE 1 CAPSULE FOUR TIMES DAILY AS NEEDED (Patient taking differently: Take 600 mg by mouth as needed (pain).), Disp: 360 capsule, Rfl: 0   guaiFENesin-dextromethorphan (ROBITUSSIN DM) 100-10 MG/5ML syrup, Take 15 mLs by mouth every 4 (four) hours as needed for cough., Disp: 118 mL, Rfl: 0   HYDROcodone-acetaminophen (NORCO/VICODIN) 5-325 MG tablet, Take 1 tablet by mouth every 6 (six) hours as needed for moderate pain., Disp: 30 tablet, Rfl: 0   insulin aspart (NOVOLOG) 100 UNIT/ML injection, Inject 0-9 Units into the skin 3 (three) times daily with meals. Correction coverage: Sensitive (thin, NPO, renal)  CBG < 70: Implement Hypoglycemia Standing Orders and refer to Hypoglycemia Standing Orders sidebar report  CBG 70 - 120: 0 units  CBG 121 - 150:  1 unit  CBG 151 - 200: 2 units  CBG 201 - 250: 3 units  CBG 251 - 300: 5 units  CBG 301 - 350: 7 units  CBG 351 - 400 9 units  CBG > 400  10 units, Disp: 10 mL, Rfl: 11   Insulin Syringes, Disposable, U-100 0.5 ML MISC, Use to inject insulin, Disp: 100 each, Rfl: 2   magnesium citrate SOLN, Take 296 mLs (1 Bottle total) by mouth once as needed for severe constipation., Disp: 195 mL, Rfl:    metFORMIN (GLUCOPHAGE-XR) 750 MG 24 hr tablet, TAKE 2 TABLETS EVERY DAY, Disp: 180 tablet, Rfl: 3   Multiple Vitamin (MULTIVITAMIN WITH  MINERALS) TABS tablet, Take 1 tablet by mouth every morning. Centrum, Disp: , Rfl:    mupirocin ointment (BACTROBAN) 2 %, Place 1 Application into the nose 2 (two) times daily., Disp: 22 g, Rfl: 0   pantoprazole (PROTONIX) 40 MG tablet, Take 1 tablet (40 mg total) by mouth daily., Disp: , Rfl:    phenol (CHLORASEPTIC) 1.4 % LIQD, Use as directed 1 spray in the mouth or throat as needed for throat irritation / pain., Disp: , Rfl: 0   pioglitazone (ACTOS) 30 MG tablet, TAKE 1 TABLET EVERY DAY, Disp: 90 tablet, Rfl: 3   polyethylene glycol (MIRALAX / GLYCOLAX) 17 g packet, Take 17 g by mouth daily as needed for mild constipation., Disp: 14 each, Rfl: 0   prednisoLONE acetate (PRED FORTE) 1 % ophthalmic suspension, Place 1 drop into the right eye every evening., Disp: , Rfl:    rosuvastatin (CRESTOR) 20 MG tablet, TAKE 1 TABLET EVERY DAY, Disp: 90 tablet, Rfl: 3   senna-docusate (SENOKOT-S) 8.6-50 MG tablet, Take 1 tablet by mouth at bedtime as needed for mild constipation., Disp: , Rfl:    warfarin (COUMADIN) 5 MG tablet, Take 2 (two) of your 5 mg peach-colored warfarin tablets on Mondays, Wednesdays and Fridays. 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 Date of diagnosis February 08 2014 Duration of anticoagulation: 6 months. End date 08/12/2014.  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 Duration of anticoagulation: 8 months. End date 10/11/2014.  Anticoagulant: Lovenox 120 units daily Switched to Eliquis on 05/25/2014 per patient preference    Pulmonary embolism (HCC)    Sebaceous cyst    on back of neck   Social History   Socioeconomic History   Marital status: Single    Spouse name: Not on file   Number of children: 0  Years of education: Not on file   Highest education level: Not on file  Occupational History   Occupation: disabled  Tobacco Use   Smoking status: Never   Smokeless tobacco: Never  Vaping Use   Vaping Use: Never used  Substance and Sexual Activity   Alcohol use: Not Currently    Comment: beer and mixed drink maybe 5  times a month   Drug use: No   Sexual activity: Never  Other Topics Concern   Not on file  Social History Narrative   Not on file   Social Determinants of Health   Financial Resource Strain: Low Risk  (12/15/2021)   Overall Financial Resource Strain (CARDIA)    Difficulty of Paying Living Expenses: Not hard at all  Food Insecurity: No Food Insecurity (03/29/2022)   Hunger Vital Sign    Worried About Running Out of Food in the Last Year: Never true    Ran Out of Food in the Last Year: Never true  Transportation Needs: No Transportation Needs (03/29/2022)   PRAPARE - Administrator, Civil Service (Medical): No    Lack of Transportation (Non-Medical): No  Physical Activity: Inactive (12/15/2021)   Exercise Vital Sign    Days of Exercise per Week: 0 days    Minutes of Exercise per Session: 0 min  Stress: No Stress Concern Present (12/15/2021)   [employer] of Occupational Health - Occupational Stress Questionnaire    Feeling of Stress : Not at all  Social  Connections: Socially Isolated (12/15/2021)   Social Connection and Isolation Panel [NHANES]    Frequency of Communication with Friends and Family: More than three times a week    Frequency of Social Gatherings with Friends and Family: More than three times a week    Attends Religious Services: Never    Database administrator or Organizations: No    Attends Engineer, structural: Never    Marital Status: Never married   Family History  Problem Relation Age of Onset   Breast cancer Mother    Cancer Mother        small intestine   Liver cancer Mother    Diabetes Mother    Diabetes Father    Diabetes Brother    Hypertension Maternal Grandmother    Heart Problems Maternal Grandmother    Diabetes Paternal Grandmother    Diabetes Paternal Grandfather    Diabetes Brother     ASSESSMENT Recent Results: The most recent result is correlated with 77.5 mg per week: Lab Results  Component Value Date   INR 2.2 01/08/2023   INR 2.1 01/01/2023   INR 1.9 (A) 12/26/2022    Anticoagulation Dosing: Description   Take 2 of your 5mg  strength peach-colored warfarin tablets by mouth, once-daily at 4:00pm on Mondays and Fridays. On Sundays, Tuesdays, Wednesdays, Thursdays and Saturdays, take 2 and 1/2 of your tablets.      INR today: Therapeutic  PLAN Weekly dose was increased by 7% to 82.5 mg per week  Patient Instructions  Patient instructed to take medications as defined in the Anti-coagulation Track section of this encounter.  Patient instructed to take today's dose.  Patient instructed to take 2 of your 5mg  strength peach-colored warfarin tablets by mouth, once-daily at 4:00pm on Mondays and Fridays. On Sundays, Tuesdays, Wednesdays, Thursdays and Saturdays, take 2 and 1/2 of your tablets.  Patient verbalized understanding of these instructions.  Patient advised to contact clinic or seek medical attention if signs/symptoms of bleeding  or thromboembolism occur.  Patient  verbalized understanding by repeating back information and was advised to contact me if further medication-related questions arise. Patient was also provided an information handout.  Follow-up Return in 13 days (on 01/21/2023) for Follow up INR.  Elicia Lamp. PharmD, CPP  15 minutes spent face-to-face with the patient during the encounter. 50% of time spent on education, including signs/sx bleeding and clotting, as well as food and drug interactions with warfarin. 50% of time was spent on fingerprick POC INR sample collection,processing, results determination, and documentation in [email or web address].

## 2023-01-09 DIAGNOSIS — H3411 Central retinal artery occlusion, right eye: Secondary | ICD-10-CM | POA: Diagnosis not present

## 2023-01-09 DIAGNOSIS — Z4781 Encounter for orthopedic aftercare following surgical amputation: Secondary | ICD-10-CM | POA: Diagnosis not present

## 2023-01-09 DIAGNOSIS — G4733 Obstructive sleep apnea (adult) (pediatric): Secondary | ICD-10-CM | POA: Diagnosis not present

## 2023-01-09 DIAGNOSIS — E1142 Type 2 diabetes mellitus with diabetic polyneuropathy: Secondary | ICD-10-CM | POA: Diagnosis not present

## 2023-01-09 DIAGNOSIS — M160 Bilateral primary osteoarthritis of hip: Secondary | ICD-10-CM | POA: Diagnosis not present

## 2023-01-09 DIAGNOSIS — L03116 Cellulitis of left lower limb: Secondary | ICD-10-CM | POA: Diagnosis not present

## 2023-01-09 DIAGNOSIS — Z89512 Acquired absence of left leg below knee: Secondary | ICD-10-CM | POA: Diagnosis not present

## 2023-01-09 DIAGNOSIS — E7800 Pure hypercholesterolemia, unspecified: Secondary | ICD-10-CM | POA: Diagnosis not present

## 2023-01-09 DIAGNOSIS — K760 Fatty (change of) liver, not elsewhere classified: Secondary | ICD-10-CM | POA: Diagnosis not present

## 2023-01-09 NOTE — Progress Notes (Signed)
INTERNAL MEDICINE TEACHING ATTENDING ADDENDUM - Gregory May M.D  Duration- indefinite, Indication- recurrent VTE, INR- therapeutic. Agree with pharmacy recommendations as outlined in their note.     

## 2023-01-11 DIAGNOSIS — M160 Bilateral primary osteoarthritis of hip: Secondary | ICD-10-CM | POA: Diagnosis not present

## 2023-01-11 DIAGNOSIS — E7800 Pure hypercholesterolemia, unspecified: Secondary | ICD-10-CM | POA: Diagnosis not present

## 2023-01-11 DIAGNOSIS — H3411 Central retinal artery occlusion, right eye: Secondary | ICD-10-CM | POA: Diagnosis not present

## 2023-01-11 DIAGNOSIS — G4733 Obstructive sleep apnea (adult) (pediatric): Secondary | ICD-10-CM | POA: Diagnosis not present

## 2023-01-11 DIAGNOSIS — Z4781 Encounter for orthopedic aftercare following surgical amputation: Secondary | ICD-10-CM | POA: Diagnosis not present

## 2023-01-11 DIAGNOSIS — L03116 Cellulitis of left lower limb: Secondary | ICD-10-CM | POA: Diagnosis not present

## 2023-01-11 DIAGNOSIS — K760 Fatty (change of) liver, not elsewhere classified: Secondary | ICD-10-CM | POA: Diagnosis not present

## 2023-01-11 DIAGNOSIS — E1142 Type 2 diabetes mellitus with diabetic polyneuropathy: Secondary | ICD-10-CM | POA: Diagnosis not present

## 2023-01-11 DIAGNOSIS — Z89512 Acquired absence of left leg below knee: Secondary | ICD-10-CM | POA: Diagnosis not present

## 2023-01-14 DIAGNOSIS — E7800 Pure hypercholesterolemia, unspecified: Secondary | ICD-10-CM | POA: Diagnosis not present

## 2023-01-14 DIAGNOSIS — Z89512 Acquired absence of left leg below knee: Secondary | ICD-10-CM | POA: Diagnosis not present

## 2023-01-14 DIAGNOSIS — E1142 Type 2 diabetes mellitus with diabetic polyneuropathy: Secondary | ICD-10-CM | POA: Diagnosis not present

## 2023-01-14 DIAGNOSIS — H3411 Central retinal artery occlusion, right eye: Secondary | ICD-10-CM | POA: Diagnosis not present

## 2023-01-14 DIAGNOSIS — M160 Bilateral primary osteoarthritis of hip: Secondary | ICD-10-CM | POA: Diagnosis not present

## 2023-01-14 DIAGNOSIS — Z4781 Encounter for orthopedic aftercare following surgical amputation: Secondary | ICD-10-CM | POA: Diagnosis not present

## 2023-01-14 DIAGNOSIS — L03116 Cellulitis of left lower limb: Secondary | ICD-10-CM | POA: Diagnosis not present

## 2023-01-14 DIAGNOSIS — K760 Fatty (change of) liver, not elsewhere classified: Secondary | ICD-10-CM | POA: Diagnosis not present

## 2023-01-14 DIAGNOSIS — G4733 Obstructive sleep apnea (adult) (pediatric): Secondary | ICD-10-CM | POA: Diagnosis not present

## 2023-01-16 ENCOUNTER — Ambulatory Visit (INDEPENDENT_AMBULATORY_CARE_PROVIDER_SITE_OTHER): Payer: Medicare HMO

## 2023-01-16 VITALS — Ht 72.0 in | Wt 187.0 lb

## 2023-01-16 DIAGNOSIS — Z0000 Encounter for general adult medical examination without abnormal findings: Secondary | ICD-10-CM

## 2023-01-16 NOTE — Patient Instructions (Addendum)
Mr. Gregory May , Thank you for taking time to come for your Medicare Wellness Visit. I appreciate your ongoing commitment to your health goals. Please review the following plan we discussed and let me know if I can assist you in the future.   These are the goals we discussed:  Goals      HEMOGLOBIN A1C < 7.0     Prevent falls        This is a list of the screening recommended for you and due dates:  Health Maintenance  Topic Date Due   COVID-19 Vaccine (1) Never done   Zoster (Shingles) Vaccine (1 of 2) Never done   Colon Cancer Screening  Never done   DTaP/Tdap/Td vaccine (1 - Tdap) 11/06/2013   Hemoglobin A1C  12/23/2022   Flu Shot  03/14/2023   Eye exam for diabetics  04/07/2023   Yearly kidney health urinalysis for diabetes  09/25/2023   Lipid (cholesterol) test  09/25/2023   Yearly kidney function blood test for diabetes  12/26/2023   Medicare Annual Wellness Visit  01/16/2024   Hepatitis C Screening  Completed   HIV Screening  Completed   HPV Vaccine  Aged Out   Complete foot exam   Discontinued    Advanced directives: Information on Advanced Care Planning can be found at [facility] Advance Health Care Directives Advance Health Care Directives ([email or web address]  Please bring a copy of your health care power of attorney and living will to the office to be added to your chart at your convenience.   Conditions/risks identified: Aim for 30 minutes of exercise or brisk walking, 6-8 glasses of water, and 5 servings of fruits and vegetables each day.   Next appointment: Follow up in one year for your annual wellness visit   Preventive Care 40-64 Years, Male Preventive care refers to lifestyle choices and visits with your health care provider that can promote health and wellness. What does preventive care include? A yearly physical exam. This is also called an annual well check. Dental exams once or twice a year. Routine eye exams. Ask your health care  provider how often you should have your eyes checked. Personal lifestyle choices, including: Daily care of your teeth and gums. Regular physical activity. Eating a healthy diet. Avoiding tobacco and drug use. Limiting alcohol use. Practicing safe sex. Taking low-dose aspirin every day starting at age 86. What happens during an annual well check? The services and screenings done by your health care provider during your annual well check will depend on your age, overall health, lifestyle risk factors, and family history of disease. Counseling  Your health care provider may ask you questions about your: Alcohol use. Tobacco use. Drug use. Emotional well-being. Home and relationship well-being. Sexual activity. Eating habits. Work and work Astronomer. Screening  You may have the following tests or measurements: Height, weight, and BMI. Blood pressure. Lipid and cholesterol levels. These may be checked every 5 years, or more frequently if you are over 69 years old. Skin check. Lung cancer screening. You may have this screening every year starting at age 72 if you have a 30-pack-year history of smoking and currently smoke or have quit within the past 15 years. Fecal occult blood test (FOBT) of the stool. You may have this test every year starting at age 67. Flexible sigmoidoscopy or colonoscopy. You may have a sigmoidoscopy every 5 years or a colonoscopy every 10 years starting at age 63. Prostate cancer screening. Recommendations will vary  depending on your family history and other risks. Hepatitis C blood test. Hepatitis B blood test. Sexually transmitted disease (STD) testing. Diabetes screening. This is done by checking your blood sugar (glucose) after you have not eaten for a while (fasting). You may have this done every 1-3 years. Discuss your test results, treatment options, and if necessary, the need for more tests with your health care provider. Vaccines  Your health care  provider may recommend certain vaccines, such as: Influenza vaccine. This is recommended every year. Tetanus, diphtheria, and acellular pertussis (Tdap, Td) vaccine. You may need a Td booster every 10 years. Zoster vaccine. You may need this after age 63. Pneumococcal 13-valent conjugate (PCV13) vaccine. You may need this if you have certain conditions and have not been vaccinated. Pneumococcal polysaccharide (PPSV23) vaccine. You may need one or two doses if you smoke cigarettes or if you have certain conditions. Talk to your health care provider about which screenings and vaccines you need and how often you need them. This information is not intended to replace advice given to you by your health care provider. Make sure you discuss any questions you have with your health care provider. Document Released: 08/26/2015 Document Revised: 04/18/2016 Document Reviewed: 05/31/2015 Elsevier Interactive Patient Education  2017 [employer].  Fall Prevention in the Home Falls can cause injuries. They can happen to people of all ages. There are many things you can do to make your home safe and to help prevent falls. What can I do on the outside of my home? Regularly fix the edges of walkways and driveways and fix any cracks. Remove anything that might make you trip as you walk through a door, such as a raised step or threshold. Trim any bushes or trees on the path to your home. Use bright outdoor lighting. Clear any walking paths of anything that might make someone trip, such as rocks or tools. Regularly check to see if handrails are loose or broken. Make sure that both sides of any steps have handrails. Any raised decks and porches should have guardrails on the edges. Have any leaves, snow, or ice cleared regularly. Use sand or salt on walking paths during winter. Clean up any spills in your garage right away. This includes oil or grease spills. What can I do in the bathroom? Use night  lights. Install grab bars by the toilet and in the tub and shower. Do not use towel bars as grab bars. Use non-skid mats or decals in the tub or shower. If you need to sit down in the shower, use a plastic, non-slip stool. Keep the floor dry. Clean up any water that spills on the floor as soon as it happens. Remove soap buildup in the tub or shower regularly. Attach bath mats securely with double-sided non-slip rug tape. Do not have throw rugs and other things on the floor that can make you trip. What can I do in the bedroom? Use night lights. Make sure that you have a light by your bed that is easy to reach. Do not use any sheets or blankets that are too big for your bed. They should not hang down onto the floor. Have a firm chair that has side arms. You can use this for support while you get dressed. Do not have throw rugs and other things on the floor that can make you trip. What can I do in the kitchen? Clean up any spills right away. Avoid walking on wet floors. Keep items that you  use a lot in easy-to-reach places. If you need to reach something above you, use a strong step stool that has a grab bar. Keep electrical cords out of the way. Do not use floor polish or wax that makes floors slippery. If you must use wax, use non-skid floor wax. Do not have throw rugs and other things on the floor that can make you trip. What can I do with my stairs? Do not leave any items on the stairs. Make sure that there are handrails on both sides of the stairs and use them. Fix handrails that are broken or loose. Make sure that handrails are as long as the stairways. Check any carpeting to make sure that it is firmly attached to the stairs. Fix any carpet that is loose or worn. Avoid having throw rugs at the top or bottom of the stairs. If you do have throw rugs, attach them to the floor with carpet tape. Make sure that you have a light switch at the top of the stairs and the bottom of the stairs. If  you do not have them, ask someone to add them for you. What else can I do to help prevent falls? Wear shoes that: Do not have high heels. Have rubber bottoms. Are comfortable and fit you well. Are closed at the toe. Do not wear sandals. If you use a stepladder: Make sure that it is fully opened. Do not climb a closed stepladder. Make sure that both sides of the stepladder are locked into place. Ask someone to hold it for you, if possible. Clearly mark and make sure that you can see: Any grab bars or handrails. First and last steps. Where the edge of each step is. Use tools that help you move around (mobility aids) if they are needed. These include: Canes. Walkers. Scooters. Crutches. Turn on the lights when you go into a dark area. Replace any light bulbs as soon as they burn out. Set up your furniture so you have a clear path. Avoid moving your furniture around. If any of your floors are uneven, fix them. If there are any pets around you, be aware of where they are. Review your medicines with your doctor. Some medicines can make you feel dizzy. This can increase your chance of falling. Ask your doctor what other things that you can do to help prevent falls. This information is not intended to replace advice given to you by your health care provider. Make sure you discuss any questions you have with your health care provider. Document Released: 05/26/2009 Document Revised: 01/05/2016 Document Reviewed: 09/03/2014 Elsevier Interactive Patient Education  2017 [employer].

## 2023-01-16 NOTE — Progress Notes (Signed)
Subjective:   Gregory May is a 62 y.o. male who presents for Medicare Annual/Subsequent preventive examination.  I connected with  Gregory May on 01/16/23 by a audio enabled telemedicine application and verified that I am speaking with the correct person using two identifiers.  Patient Location: Home  Provider Location: Home Office  I discussed the limitations of evaluation and management by telemedicine. The patient expressed understanding and agreed to proceed.  Review of Systems     Cardiac Risk Factors include: advanced age (>36men, >20 women);diabetes mellitus;dyslipidemia;hypertension;male gender;sedentary lifestyle     Objective:    Today's Vitals   01/16/23 1531  Weight: 187 lb (84.8 kg)  Height: 6' (1.829 m)   Body mass index is 25.36 kg/m.     01/16/2023    4:04 PM 11/26/2022    2:58 PM 09/28/2022    9:43 PM 03/29/2022   10:49 AM 02/28/2022    7:46 AM 01/24/2022    1:09 PM 01/04/2022    4:13 PM  Advanced Directives  Does Patient Have a Medical Advance Directive? No No No No No No No  Would patient like information on creating a medical advance directive? Yes (MAU/Ambulatory/Procedural Areas - Information given) No - Patient declined  No - Patient declined No - Patient declined  No - Patient declined    Current Medications (verified) Outpatient Encounter Medications as of 01/16/2023  Medication Sig   Accu-Chek FastClix Lancets MISC Use Accu Chek Fastclix lancets to check blood sugar three times daily. DX:E11.65   ACCU-CHEK GUIDE test strip TEST BLOOD SUGAR THREE TIMES DAILY AS DIRECTED   acetaminophen (TYLENOL) 500 MG tablet Take 1,000 mg by mouth as needed for mild pain or moderate pain.   alum & mag hydroxide-simeth (MAALOX/MYLANTA) 200-200-20 MG/5ML suspension Take 15-30 mLs by mouth every 2 (two) hours as needed for indigestion.   APPLE CIDER VINEGAR PO Take 1 tablet by mouth daily.   ascorbic acid (VITAMIN C) 1000 MG tablet Take 1 tablet (1,000  mg total) by mouth daily.   bisacodyl (DULCOLAX) 5 MG EC tablet Take 1 tablet (5 mg total) by mouth daily as needed for moderate constipation.   empagliflozin (JARDIANCE) 25 MG TABS tablet Take 1 tablet (25 mg total) by mouth daily.   fluticasone (FLONASE) 50 MCG/ACT nasal spray USE 2 SPRAY(S) IN EACH NOSTRIL AT BEDTIME AS NEEDED FOR  SINUS  DRAINAGE (Patient taking differently: Place 1-2 sprays into both nostrils daily.)   gabapentin (NEURONTIN) 300 MG capsule TAKE 1 CAPSULE FOUR TIMES DAILY AS NEEDED (Patient taking differently: Take 600 mg by mouth as needed (pain).)   insulin aspart (NOVOLOG) 100 UNIT/ML injection Inject 0-9 Units into the skin 3 (three) times daily with meals. Correction coverage: Sensitive (thin, NPO, renal)  CBG < 70: Implement Hypoglycemia Standing Orders and refer to Hypoglycemia Standing Orders sidebar report  CBG 70 - 120: 0 units  CBG 121 - 150: 1 unit  CBG 151 - 200: 2 units  CBG 201 - 250: 3 units  CBG 251 - 300: 5 units  CBG 301 - 350: 7 units  CBG 351 - 400 9 units  CBG > 400  10 units   Insulin Syringes, Disposable, U-100 0.5 ML MISC Use to inject insulin   magnesium citrate SOLN Take 296 mLs (1 Bottle total) by mouth once as needed for severe constipation.   metFORMIN (GLUCOPHAGE-XR) 750 MG 24 hr tablet TAKE 2 TABLETS EVERY DAY   Multiple Vitamin (MULTIVITAMIN WITH MINERALS) TABS tablet  Take 1 tablet by mouth every morning. Centrum   mupirocin ointment (BACTROBAN) 2 % Place 1 Application into the nose 2 (two) times daily.   pantoprazole (PROTONIX) 40 MG tablet Take 1 tablet (40 mg total) by mouth daily.   phenol (CHLORASEPTIC) 1.4 % LIQD Use as directed 1 spray in the mouth or throat as needed for throat irritation / pain.   pioglitazone (ACTOS) 30 MG tablet TAKE 1 TABLET EVERY DAY   polyethylene glycol (MIRALAX / GLYCOLAX) 17 g packet Take 17 g by mouth daily as needed for mild constipation.   prednisoLONE acetate (PRED FORTE) 1 % ophthalmic suspension  Place 1 drop into the right eye every evening.   rosuvastatin (CRESTOR) 20 MG tablet TAKE 1 TABLET EVERY DAY   senna-docusate (SENOKOT-S) 8.6-50 MG tablet Take 1 tablet by mouth at bedtime as needed for mild constipation.   warfarin (COUMADIN) 5 MG tablet Take 2 (two) of your 5 mg peach-colored warfarin tablets on Mondays, Wednesdays and Fridays. All OTHER DAYS, (Sundays, Tuesdays, Thursdays and Saturdays) take two and one-half ( 2 & 1/2 tablets.)   zinc sulfate 220 (50 Zn) MG capsule Take 1 capsule (220 mg total) by mouth daily.   cephALEXin (KEFLEX) 500 MG capsule Take 1 capsule (500 mg total) by mouth 3 (three) times daily. (Patient not taking: Reported on 01/16/2023)   ciprofloxacin (CIPRO) 500 MG tablet Take 1 tablet (500 mg total) by mouth 2 (two) times daily. (Patient not taking: Reported on 01/16/2023)   guaiFENesin-dextromethorphan (ROBITUSSIN DM) 100-10 MG/5ML syrup Take 15 mLs by mouth every 4 (four) hours as needed for cough. (Patient not taking: Reported on 01/16/2023)   HYDROcodone-acetaminophen (NORCO/VICODIN) 5-325 MG tablet Take 1 tablet by mouth every 6 (six) hours as needed for moderate pain. (Patient not taking: Reported on 01/16/2023)   No facility-administered encounter medications on file as of 01/16/2023.    Allergies (verified) Patient has no known allergies.   History: Past Medical History:  Diagnosis Date   Arthritis    bilateral hips   Cutaneous abscess of left foot    Deep vein thrombosis (DVT) (HCC)    Diabetes mellitus    type II   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 Date of diagnosis February 08 2014 Duration of anticoagulation: 6 months. End date 08/12/2014.  Anticoagulant: Lovenox 120 units daily Switched to Eliquis on 05/25/2014      Ear drum perforation, right 03/06/2019   Non-pressure chronic ulcer of right calf, limited to breakdown of skin (HCC)  04/17/2017   Pulmonary emboli (HCC) 02/08/2014   Date of diagnosis February 08 2014, on chest CTA Hospitalized for 3 days Had some symptoms of shortness of breath, and chest pain With intercurrent DVT of the left LE. Duration of anticoagulation: 8 months. End date 10/11/2014.  Anticoagulant: Lovenox 120 units daily Switched to Eliquis on 05/25/2014 per patient preference    Pulmonary embolism (HCC)    Sebaceous cyst    on back of neck   Past Surgical History:  Procedure Laterality Date   AMPUTATION Right 06/03/2015   Procedure: Right Below Knee Amputation;  Surgeon: Nadara Mustard, MD;  Location: [facility] OR;  Service: Orthopedics;  Laterality: Right;   AMPUTATION Left 07/24/2019   Procedure: LEFT FOOT 3RD RAY AMPUTATION;  Surgeon: Nadara Mustard, MD;  Location: [facility] OR;  Service: Orthopedics;  Laterality: Left;  AMPUTATION Left 06/23/2021   Procedure: LEFT 2ND TOE AMPUTATION;  Surgeon: Nadara Mustard, MD;  Location: [facility] OR;  Service: Orthopedics;  Laterality: Left;   AMPUTATION Right 08/21/2021   Procedure: AMPUTATION RIGHT INDEX FINGER;  Surgeon: Gomez Cleverly, MD;  Location: [facility] OR;  Service: Orthopedics;  Laterality: Right;   AMPUTATION Left 09/29/2022   Procedure: AMPUTATION BELOW KNEE;  Surgeon: Nadara Mustard, MD;  Location: [facility] OR;  Service: Orthopedics;  Laterality: Left;   BUBBLE STUDY  01/24/2022   Procedure: BUBBLE STUDY;  Surgeon: Wendall Stade, MD;  Location: [facility] ENDOSCOPY;  Service: Cardiovascular;;   CLOSED REDUCTION WITH HUMER PIN INSERTION  1974   left hip   HARDWARE REMOVAL Left 07/21/2014   Procedure: HARDWARE REMOVAL;  Surgeon: Loreta Ave, MD;  Location: [facility] OR;  Service: Orthopedics;  Laterality: Left;   I & D EXTREMITY Right 08/21/2021   Procedure: IRRIGATION AND DEBRIDEMENT RIGHT INDEX FINGER;  Surgeon: Gomez Cleverly, MD;  Location: [facility] OR;  Service: Orthopedics;  Laterality: Right;   PATENT FORAMEN OVALE(PFO) CLOSURE N/A 02/28/2022   Procedure: PATENT FORAMEN OVALE(PFO) CLOSURE;   Surgeon: Tonny Bollman, MD;  Location: [facility] INVASIVE CV LAB;  Service: Cardiovascular;  Laterality: N/A;   ROTATOR CUFF REPAIR Right 2005 (approx)   TEE WITHOUT CARDIOVERSION N/A 01/24/2022   Procedure: TRANSESOPHAGEAL ECHOCARDIOGRAM (TEE);  Surgeon: Wendall Stade, MD;  Location: [facility] ENDOSCOPY;  Service: Cardiovascular;  Laterality: N/A;   TOTAL HIP ARTHROPLASTY Left 07/21/2014   Procedure: TOTAL HIP ARTHROPLASTY ANTERIOR APPROACH;  Surgeon: Loreta Ave, MD;  Location: [facility] OR;  Service: Orthopedics;  Laterality: Left;   TOTAL HIP ARTHROPLASTY Right 2006 (approx)   right hip replaced   Family History  Problem Relation Age of Onset   Breast cancer Mother    Cancer Mother        small intestine   Liver cancer Mother    Diabetes Mother    Diabetes Father    Diabetes Brother    Hypertension Maternal Grandmother    Heart Problems Maternal Grandmother    Diabetes Paternal Grandmother    Diabetes Paternal Grandfather    Diabetes Brother    Social History   Socioeconomic History   Marital status: Single    Spouse name: Not on file   Number of children: 0   Years of education: Not on file   Highest education level: Not on file  Occupational History   Occupation: disabled  Tobacco Use   Smoking status: Never   Smokeless tobacco: Never  Vaping Use   Vaping Use: Never used  Substance and Sexual Activity   Alcohol use: Not Currently    Comment: beer and mixed drink maybe 5  times a month   Drug use: No   Sexual activity: Never  Other Topics Concern   Not on file  Social History Narrative   Not on file   Social Determinants of Health   Financial Resource Strain: Low Risk  (01/16/2023)   Overall Financial Resource Strain (CARDIA)    Difficulty of Paying Living Expenses: Not hard at all  Food Insecurity: No Food Insecurity (01/16/2023)   Hunger Vital Sign    Worried About Running Out of Food in the Last Year: Never true    Ran Out of Food in the Last Year: Never true   Transportation Needs: No Transportation Needs (01/16/2023)   PRAPARE - Administrator, Civil Service (Medical): No    Lack of Transportation (Non-Medical): No  Physical Activity: Inactive (01/16/2023)   Exercise Vital Sign    Days of Exercise per Week: 0 days    Minutes of Exercise per Session: 0 min  Stress: No Stress Concern Present (01/16/2023)   [employer] of Occupational Health - Occupational Stress Questionnaire    Feeling of Stress : Only a little  Social Connections: Socially Isolated (01/16/2023)   Social Connection and Isolation Panel [NHANES]    Frequency of Communication with Friends and Family: More than three times a week    Frequency of Social Gatherings with Friends and Family: Twice a week    Attends Religious Services: Never    Database administrator or Organizations: No    Attends Engineer, structural: Never    Marital Status: Never married    Tobacco Counseling Counseling given: Not Answered   Clinical Intake:  Pre-visit preparation completed: Yes  Pain : No/denies pain  Diabetes: Yes CBG done?: No Did pt. bring in CBG monitor from home?: No  How often do you need to have someone help you when you read instructions, pamphlets, or other written materials from your doctor or pharmacy?: 1 - Never  Diabetic?Yes  Nutrition Risk Assessment:  Has the patient had any N/V/D within the last 2 months?  No  Does the patient have any non-healing wounds?  Yes  Has the patient had any unintentional weight loss or weight gain?  No   Diabetes:  Is the patient diabetic?  Yes  If diabetic, was a CBG obtained today?  No  Did the patient bring in their glucometer from home?  No  How often do you monitor your CBG's? daily.   Financial Strains and Diabetes Management:  Are you having any financial strains with the device, your supplies or your medication? No .  Does the patient want to be seen by Chronic Care Management for management of their  diabetes?  No  Would the patient like to be referred to a Nutritionist or for Diabetic Management?  No   Diabetic Exams:  Diabetic Eye Exam: Completed 04/06/22 Diabetic Foot Exam: Completed 11/23/21   Interpreter Needed?: No  Information entered by :: Kandis Fantasia LPN   Activities of Daily Living    01/16/2023    4:04 PM 11/26/2022    2:58 PM  In your present state of health, do you have any difficulty performing the following activities:  Hearing? 0 0  Vision? 0 1  Difficulty concentrating or making decisions? 0 0  Walking or climbing stairs? 1 1  Dressing or bathing? 0 0  Doing errands, shopping? 1 0  Preparing Food and eating ? N   Using the Toilet? N   In the past six months, have you accidently leaked urine? N   Do you have problems with loss of bowel control? N   Managing your Medications? N   Managing your Finances? N   Housekeeping or managing your Housekeeping? Y     Patient Care Team: Gwenevere Abbot, MD as PCP - General (Internal Medicine) Juanell Fairly, RN as [facility] Care Management Shelah Lewandowsky, MD as Consulting Physician (Optometry) Reather Littler, MD as Consulting Physician (Endocrinology) Nadara Mustard, MD as Consulting Physician (Orthopedic Surgery)  Indicate any recent Medical Services you may have received from other than [facility] providers in the past year (date may be approximate).     Assessment:   This is a routine wellness examination for Gregory May.  Hearing/Vision screen Hearing Screening - Comments:: Denies hearing  difficulties   Vision Screening - Comments:: Wears rx glasses - up to date with routine eye exams with Dr. Essie Hart    Dietary issues and exercise activities discussed: Current Exercise Habits: The patient does not participate in regular exercise at present   Goals Addressed             This Visit's Progress    COMPLETED: Care Coordination Activites - No follow up required        Care Coordination  Interventions: Active listening / Reflection utilized  Emotional Support Provided Problem Solving /Task Center strategies reviewed Discussed/.Educated Care Coordination Program 2.   Discussed/.Educated Annual Wellness Visit 3.   Discussed/.Educated Social Determinates of Health 4.   Please inform PCP if services needed in the future Will follow up with the patient in 2 months      Prevent falls        Depression Screen    01/16/2023    4:05 PM 11/26/2022    2:58 PM 03/29/2022   10:43 AM 01/04/2022    4:13 PM 12/15/2021    2:40 PM 11/23/2021    2:47 PM 10/26/2021    4:32 PM  PHQ 2/9 Scores  PHQ - 2 Score 0 0 0 0 0 1 0  PHQ- 9 Score     1 2 2     Fall Risk    01/16/2023    4:03 PM 11/26/2022    2:58 PM 04/23/2022    4:15 PM 03/29/2022   10:46 AM 01/04/2022    4:13 PM  Fall Risk   Falls in the past year? 1 0 1 1 0  Number falls in past yr: 0 0 1 0   Injury with Fall? 0 0 0 0   Risk for fall due to : Impaired balance/gait;Impaired mobility No Fall Risks Impaired balance/gait History of fall(s) No Fall Risks  Follow up Education provided;Falls prevention discussed;Falls evaluation completed Falls evaluation completed;Falls prevention discussed Falls evaluation completed;Education provided Falls prevention discussed Falls evaluation completed    FALL RISK PREVENTION PERTAINING TO THE HOME:  Any stairs in or around the home? No  If so, are there any without handrails? No  Home free of loose throw rugs in walkways, pet beds, electrical cords, etc? Yes  Adequate lighting in your home to reduce risk of falls? Yes   ASSISTIVE DEVICES UTILIZED TO PREVENT FALLS:  Life alert? No  Use of a cane, walker or w/c? Yes  Grab bars in the bathroom? Yes  Shower chair or bench in shower? Yes  Elevated toilet seat or a handicapped toilet? Yes   TIMED UP AND GO:  Was the test performed? No . Telephonic visit   Cognitive Function:        01/16/2023    4:04 PM 12/15/2021    2:48 PM  6CIT Screen   What Year? 0 points 0 points  What month? 0 points 0 points  What time? 0 points 0 points  Count back from 20 0 points 0 points  Months in reverse 0 points 0 points  Repeat phrase 0 points 2 points  Total Score 0 points 2 points    Immunizations Immunization History  Administered Date(s) Administered   Influenza,inj,Quad PF,6+ Mos 07/22/2014, 06/11/2015, 07/20/2016, 06/20/2017, 05/30/2018, 05/15/2019, 06/23/2020, 05/29/2021   Pneumococcal Polysaccharide-23 07/22/2014   Tetanus 11/05/2013    TDAP status: Up to date  Pneumococcal vaccine status: Up to date  Covid-19 vaccine status: Information provided on how to obtain vaccines.   Qualifies for  Shingles Vaccine? Yes   Zostavax completed No   Shingrix Completed?: No.    Education has been provided regarding the importance of this vaccine. Patient has been advised to call insurance company to determine out of pocket expense if they have not yet received this vaccine. Advised may also receive vaccine at local pharmacy or Health Dept. Verbalized acceptance and understanding.  Screening Tests Health Maintenance  Topic Date Due   COVID-19 Vaccine (1) Never done   Zoster Vaccines- Shingrix (1 of 2) Never done   Colonoscopy  Never done   DTaP/Tdap/Td (1 - Tdap) 11/06/2013   HEMOGLOBIN A1C  12/23/2022   INFLUENZA VACCINE  03/14/2023   OPHTHALMOLOGY EXAM  04/07/2023   Diabetic kidney evaluation - Urine ACR  09/25/2023   LIPID PANEL  09/25/2023   Diabetic kidney evaluation - eGFR measurement  12/26/2023   Medicare Annual Wellness (AWV)  01/16/2024   Hepatitis C Screening  Completed   HIV Screening  Completed   HPV VACCINES  Aged Out   FOOT EXAM  Discontinued    Health Maintenance  Health Maintenance Due  Topic Date Due   COVID-19 Vaccine (1) Never done   Zoster Vaccines- Shingrix (1 of 2) Never done   Colonoscopy  Never done   DTaP/Tdap/Td (1 - Tdap) 11/06/2013   HEMOGLOBIN A1C  12/23/2022    Colorectal cancer  screening:  Patient declines at this time  Lung Cancer Screening: (Low Dose CT Chest recommended if Age 32-80 years, 30 pack-year currently smoking OR have quit w/in 15years.) does not qualify.   Lung Cancer Screening Referral: n/a  Additional Screening:  Hepatitis C Screening: does qualify; Completed 04/20/20  Vision Screening: Recommended annual ophthalmology exams for early detection of glaucoma and other disorders of the eye. Is the patient up to date with their annual eye exam?  Yes  Who is the provider or what is the name of the office in which the patient attends annual eye exams? Dr. Essie Hart  If pt is not established with a provider, would they like to be referred to a provider to establish care? No .   Dental Screening: Recommended annual dental exams for proper oral hygiene  Community Resource Referral / Chronic Care Management: CRR required this visit?  No   CCM required this visit?  No      Plan:     I have personally reviewed and noted the following in the patient's chart:   Medical and social history Use of alcohol, tobacco or illicit drugs  Current medications and supplements including opioid prescriptions. Patient is not currently taking opioid prescriptions. Functional ability and status Nutritional status Physical activity Advanced directives List of other physicians Hospitalizations, surgeries, and ER visits in previous 12 months Vitals Screenings to include cognitive, depression, and falls Referrals and appointments  In addition, I have reviewed and discussed with patient certain preventive protocols, quality metrics, and best practice recommendations. A written personalized care plan for preventive services as well as general preventive health recommendations were provided to patient.     Durwin Nora, [place]   08/18/1094   Due to this being a virtual visit, the after visit summary with patients personalized plan was offered to patient via mail  or my-chart.  per request, patient was mailed a copy of AVS.  Nurse Notes: No concerns

## 2023-01-17 DIAGNOSIS — H3411 Central retinal artery occlusion, right eye: Secondary | ICD-10-CM | POA: Diagnosis not present

## 2023-01-17 DIAGNOSIS — G4733 Obstructive sleep apnea (adult) (pediatric): Secondary | ICD-10-CM | POA: Diagnosis not present

## 2023-01-17 DIAGNOSIS — Z4781 Encounter for orthopedic aftercare following surgical amputation: Secondary | ICD-10-CM | POA: Diagnosis not present

## 2023-01-17 DIAGNOSIS — E1142 Type 2 diabetes mellitus with diabetic polyneuropathy: Secondary | ICD-10-CM | POA: Diagnosis not present

## 2023-01-17 DIAGNOSIS — E7800 Pure hypercholesterolemia, unspecified: Secondary | ICD-10-CM | POA: Diagnosis not present

## 2023-01-17 DIAGNOSIS — K760 Fatty (change of) liver, not elsewhere classified: Secondary | ICD-10-CM | POA: Diagnosis not present

## 2023-01-17 DIAGNOSIS — M160 Bilateral primary osteoarthritis of hip: Secondary | ICD-10-CM | POA: Diagnosis not present

## 2023-01-17 DIAGNOSIS — L03116 Cellulitis of left lower limb: Secondary | ICD-10-CM | POA: Diagnosis not present

## 2023-01-17 DIAGNOSIS — Z89512 Acquired absence of left leg below knee: Secondary | ICD-10-CM | POA: Diagnosis not present

## 2023-01-21 ENCOUNTER — Other Ambulatory Visit: Payer: Self-pay | Admitting: Orthopedic Surgery

## 2023-01-21 ENCOUNTER — Telehealth: Payer: Self-pay | Admitting: Orthopedic Surgery

## 2023-01-21 ENCOUNTER — Ambulatory Visit (INDEPENDENT_AMBULATORY_CARE_PROVIDER_SITE_OTHER): Payer: Medicare HMO | Admitting: Pharmacist

## 2023-01-21 DIAGNOSIS — K760 Fatty (change of) liver, not elsewhere classified: Secondary | ICD-10-CM | POA: Diagnosis not present

## 2023-01-21 DIAGNOSIS — Z89512 Acquired absence of left leg below knee: Secondary | ICD-10-CM | POA: Diagnosis not present

## 2023-01-21 DIAGNOSIS — L03116 Cellulitis of left lower limb: Secondary | ICD-10-CM | POA: Diagnosis not present

## 2023-01-21 DIAGNOSIS — G4733 Obstructive sleep apnea (adult) (pediatric): Secondary | ICD-10-CM | POA: Diagnosis not present

## 2023-01-21 DIAGNOSIS — H3411 Central retinal artery occlusion, right eye: Secondary | ICD-10-CM

## 2023-01-21 DIAGNOSIS — H349 Unspecified retinal vascular occlusion: Secondary | ICD-10-CM | POA: Diagnosis not present

## 2023-01-21 DIAGNOSIS — Z7901 Long term (current) use of anticoagulants: Secondary | ICD-10-CM

## 2023-01-21 DIAGNOSIS — M160 Bilateral primary osteoarthritis of hip: Secondary | ICD-10-CM | POA: Diagnosis not present

## 2023-01-21 DIAGNOSIS — E7800 Pure hypercholesterolemia, unspecified: Secondary | ICD-10-CM | POA: Diagnosis not present

## 2023-01-21 DIAGNOSIS — I63412 Cerebral infarction due to embolism of left middle cerebral artery: Secondary | ICD-10-CM

## 2023-01-21 DIAGNOSIS — Z4781 Encounter for orthopedic aftercare following surgical amputation: Secondary | ICD-10-CM | POA: Diagnosis not present

## 2023-01-21 DIAGNOSIS — E1142 Type 2 diabetes mellitus with diabetic polyneuropathy: Secondary | ICD-10-CM | POA: Diagnosis not present

## 2023-01-21 LAB — POCT INR: INR: 3 (ref 2.0–3.0)

## 2023-01-21 MED ORDER — HYDROCODONE-ACETAMINOPHEN 5-325 MG PO TABS: 1.0000 | ORAL_TABLET | Freq: Four times a day (QID) | ORAL | 0 refills | Status: DC | PRN

## 2023-01-21 NOTE — Telephone Encounter (Signed)
Gregory May is calling in to request a refill of his pain medication, hydrocodone 5-325 mg.  He is c/o of consistent burning pain and is "down to only one" tablet.  He uses the [facility] on [address].

## 2023-01-21 NOTE — Progress Notes (Signed)
Anticoagulation Management Gregory May is a 62 y.o. male who reports to the clinic for monitoring of warfarin treatment.    Indication: Retinal artery occlusion, central Right retinal embolus Long term (current) use of anticoagulants   Duration: indefinite Supervising physician: Carlynn Purl  Anticoagulation Clinic Visit History: Patient does not report signs/symptoms of bleeding or thromboembolism  Other recent changes: No diet, medications, lifestyle changes cited by the patient at this visit.  Anticoagulation Episode Summary     Current INR goal:  2.0-3.0  TTR:  56.0 % (8.9 mo)  Next INR check:  02/11/2023  INR from last check:  3.0 (01/21/2023)  Weekly max warfarin dose:    Target end date:    INR check location:    Preferred lab:    Send INR reminders to:     Indications   Pulmonary emboli (HCC) (Resolved) [I26.99] DVT (deep venous thrombosis) (HCC) (Resolved) [I82.409] Retinal artery occlusion central [H34.10] Right retinal embolus [H34.9] Long term (current) use of anticoagulants [Z79.01]        Comments:           No Known Allergies  Current Outpatient Medications:    Accu-Chek FastClix Lancets MISC, Use Accu Chek Fastclix lancets to check blood sugar three times daily. DX:E11.65, Disp: 300 each, Rfl: 2   ACCU-CHEK GUIDE test strip, TEST BLOOD SUGAR THREE TIMES DAILY AS DIRECTED, Disp: 300 strip, Rfl: 3   acetaminophen (TYLENOL) 500 MG tablet, Take 1,000 mg by mouth as needed for mild pain or moderate pain., Disp: , Rfl:    alum & mag hydroxide-simeth (MAALOX/MYLANTA) 200-200-20 MG/5ML suspension, Take 15-30 mLs by mouth every 2 (two) hours as needed for indigestion., Disp: 355 mL, Rfl: 0   APPLE CIDER VINEGAR PO, Take 1 tablet by mouth daily., Disp: , Rfl:    ascorbic acid (VITAMIN C) 1000 MG tablet, Take 1 tablet (1,000 mg total) by mouth daily., Disp: , Rfl:    bisacodyl (DULCOLAX) 5 MG EC tablet, Take 1 tablet (5 mg total) by mouth daily as needed  for moderate constipation., Disp: 30 tablet, Rfl: 0   cephALEXin (KEFLEX) 500 MG capsule, Take 1 capsule (500 mg total) by mouth 3 (three) times daily., Disp: 30 capsule, Rfl: 0   ciprofloxacin (CIPRO) 500 MG tablet, Take 1 tablet (500 mg total) by mouth 2 (two) times daily., Disp: 20 tablet, Rfl: 0   empagliflozin (JARDIANCE) 25 MG TABS tablet, Take 1 tablet (25 mg total) by mouth daily., Disp: 90 tablet, Rfl: 3   fluticasone (FLONASE) 50 MCG/ACT nasal spray, USE 2 SPRAY(S) IN EACH NOSTRIL AT BEDTIME AS NEEDED FOR  SINUS  DRAINAGE (Patient taking differently: Place 1-2 sprays into both nostrils daily.), Disp: 16 g, Rfl: 0   gabapentin (NEURONTIN) 300 MG capsule, TAKE 1 CAPSULE FOUR TIMES DAILY AS NEEDED (Patient taking differently: Take 600 mg by mouth as needed (pain).), Disp: 360 capsule, Rfl: 0   guaiFENesin-dextromethorphan (ROBITUSSIN DM) 100-10 MG/5ML syrup, Take 15 mLs by mouth every 4 (four) hours as needed for cough., Disp: 118 mL, Rfl: 0   HYDROcodone-acetaminophen (NORCO/VICODIN) 5-325 MG tablet, Take 1 tablet by mouth every 6 (six) hours as needed for moderate pain., Disp: 30 tablet, Rfl: 0   insulin aspart (NOVOLOG) 100 UNIT/ML injection, Inject 0-9 Units into the skin 3 (three) times daily with meals. Correction coverage: Sensitive (thin, NPO, renal)  CBG < 70: Implement Hypoglycemia Standing Orders and refer to Hypoglycemia Standing Orders sidebar report  CBG 70 - 120: 0 units  CBG 121 - 150: 1 unit  CBG 151 - 200: 2 units  CBG 201 - 250: 3 units  CBG 251 - 300: 5 units  CBG 301 - 350: 7 units  CBG 351 - 400 9 units  CBG > 400  10 units, Disp: 10 mL, Rfl: 11   Insulin Syringes, Disposable, U-100 0.5 ML MISC, Use to inject insulin, Disp: 100 each, Rfl: 2   magnesium citrate SOLN, Take 296 mLs (1 Bottle total) by mouth once as needed for severe constipation., Disp: 195 mL, Rfl:    metFORMIN (GLUCOPHAGE-XR) 750 MG 24 hr tablet, TAKE 2 TABLETS EVERY DAY, Disp: 180 tablet, Rfl: 3   Multiple  Vitamin (MULTIVITAMIN WITH MINERALS) TABS tablet, Take 1 tablet by mouth every morning. Centrum, Disp: , Rfl:    mupirocin ointment (BACTROBAN) 2 %, Place 1 Application into the nose 2 (two) times daily., Disp: 22 g, Rfl: 0   pantoprazole (PROTONIX) 40 MG tablet, Take 1 tablet (40 mg total) by mouth daily., Disp: , Rfl:    phenol (CHLORASEPTIC) 1.4 % LIQD, Use as directed 1 spray in the mouth or throat as needed for throat irritation / pain., Disp: , Rfl: 0   pioglitazone (ACTOS) 30 MG tablet, TAKE 1 TABLET EVERY DAY, Disp: 90 tablet, Rfl: 3   polyethylene glycol (MIRALAX / GLYCOLAX) 17 g packet, Take 17 g by mouth daily as needed for mild constipation., Disp: 14 each, Rfl: 0   prednisoLONE acetate (PRED FORTE) 1 % ophthalmic suspension, Place 1 drop into the right eye every evening., Disp: , Rfl:    rosuvastatin (CRESTOR) 20 MG tablet, TAKE 1 TABLET EVERY DAY, Disp: 90 tablet, Rfl: 3   senna-docusate (SENOKOT-S) 8.6-50 MG tablet, Take 1 tablet by mouth at bedtime as needed for mild constipation., Disp: , Rfl:    warfarin (COUMADIN) 5 MG tablet, Take 2 (two) of your 5 mg peach-colored warfarin tablets on Mondays, Wednesdays and Fridays. 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 Date of diagnosis February 08 2014 Duration of anticoagulation: 6 months. End date 08/12/2014.  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 Duration of anticoagulation: 8 months. End date 10/11/2014.  Anticoagulant: Lovenox 120 units daily Switched to Eliquis on 05/25/2014 per patient preference    Pulmonary embolism (HCC)    Sebaceous cyst    on back of neck   Social History   Socioeconomic History   Marital status: Single    Spouse name: Not on file  Number of children: 0   Years of education: Not on file   Highest education level: Not on file  Occupational History   Occupation: disabled  Tobacco Use   Smoking status: Never   Smokeless tobacco: Never  Vaping Use   Vaping Use: Never used  Substance and Sexual Activity   Alcohol use: Not Currently    Comment: beer and mixed drink maybe 5  times a month   Drug use: No   Sexual activity: Never  Other Topics Concern   Not on file  Social History Narrative   Not on file   Social Determinants of Health   Financial Resource Strain: Low Risk  (01/16/2023)   Overall Financial Resource Strain (CARDIA)    Difficulty of Paying Living Expenses: Not hard at all  Food Insecurity: No Food Insecurity (01/16/2023)   Hunger Vital Sign    Worried About Running Out of Food in the Last Year: Never true    Ran Out of Food in the Last Year: Never true  Transportation Needs: No Transportation Needs (01/16/2023)   PRAPARE - Administrator, Civil Service (Medical): No    Lack of Transportation (Non-Medical): No  Physical Activity: Inactive (01/16/2023)   Exercise Vital Sign    Days of Exercise per Week: 0 days    Minutes of Exercise per Session: 0 min  Stress: No Stress Concern Present (01/16/2023)   [employer] of Occupational Health - Occupational Stress Questionnaire    Feeling of  Stress : Only a little  Social Connections: Socially Isolated (01/16/2023)   Social Connection and Isolation Panel [NHANES]    Frequency of Communication with Friends and Family: More than three times a week    Frequency of Social Gatherings with Friends and Family: Twice a week    Attends Religious Services: Never    Database administrator or Organizations: No    Attends Engineer, structural: Never    Marital Status: Never married   Family History  Problem Relation Age of Onset   Breast cancer Mother    Cancer Mother        small intestine   Liver cancer Mother    Diabetes Mother    Diabetes Father    Diabetes Brother    Hypertension Maternal Grandmother    Heart Problems Maternal Grandmother    Diabetes Paternal Grandmother    Diabetes Paternal Grandfather    Diabetes Brother     ASSESSMENT Recent Results: The most recent result is correlated with 82.5 mg per week: Lab Results  Component Value Date   INR 3.0 01/21/2023   INR 2.2 01/08/2023   INR 2.1 01/01/2023    Anticoagulation Dosing: Description   Take 2 of your 5mg  strength peach-colored warfarin tablets by mouth, once-daily at 4:00pm on Mondays, Tuesdays, Wednesdays and Fridays. On Sundays, Thursdays and Saturdays, take 2 and 1/2 of your tablets.      INR today: Therapeutic  PLAN Weekly dose was decreased by 6% to 77.5 mg per week  Patient Instructions  Patient instructed to take medications as defined in the Anti-coagulation Track section of this encounter.  Patient instructed to take today's dose.  Patient instructed to take 2 of your 5mg  strength peach-colored warfarin tablets by mouth, once-daily at 4:00pm on Mondays, Tuesdays, Wednesdays and Fridays. On Sundays, Thursdays and Saturdays, take 2 and 1/2 of your tablets.  Patient verbalized understanding of these instructions.  Patient advised to contact clinic or seek medical attention if signs/symptoms  of bleeding or thromboembolism  occur.  Patient verbalized understanding by repeating back information and was advised to contact me if further medication-related questions arise. Patient was also provided an information handout.  Follow-up Return in 3 weeks (on 02/11/2023) for Follow up INR.  Elicia Lamp, PharmD, CPP  15 minutes spent face-to-face with the patient during the encounter. 50% of time spent on education, including signs/sx bleeding and clotting, as well as food and drug interactions with warfarin. 50% of time was spent on fingerprick POC INR sample collection,processing, results determination, and documentation in [email or web address].

## 2023-01-21 NOTE — Patient Instructions (Signed)
Patient instructed to take medications as defined in the Anti-coagulation Track section of this encounter.  Patient instructed to take today's dose.  Patient instructed to take 2 of your 5mg  strength peach-colored warfarin tablets by mouth, once-daily at 4:00pm on Mondays, Tuesdays, Wednesdays and Fridays. On Sundays, Thursdays and Saturdays, take 2 and 1/2 of your tablets.  Patient verbalized understanding of these instructions.

## 2023-01-21 NOTE — Telephone Encounter (Signed)
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 Hydrocodone 5/325 last filled 12/31/2022 #30 pt requesting refill please advise.

## 2023-01-23 ENCOUNTER — Telehealth: Payer: Self-pay | Admitting: Internal Medicine

## 2023-01-23 DIAGNOSIS — Z4781 Encounter for orthopedic aftercare following surgical amputation: Secondary | ICD-10-CM | POA: Diagnosis not present

## 2023-01-23 DIAGNOSIS — L03116 Cellulitis of left lower limb: Secondary | ICD-10-CM | POA: Diagnosis not present

## 2023-01-23 DIAGNOSIS — H3411 Central retinal artery occlusion, right eye: Secondary | ICD-10-CM | POA: Diagnosis not present

## 2023-01-23 DIAGNOSIS — M160 Bilateral primary osteoarthritis of hip: Secondary | ICD-10-CM | POA: Diagnosis not present

## 2023-01-23 DIAGNOSIS — Z89512 Acquired absence of left leg below knee: Secondary | ICD-10-CM | POA: Diagnosis not present

## 2023-01-23 DIAGNOSIS — E1142 Type 2 diabetes mellitus with diabetic polyneuropathy: Secondary | ICD-10-CM | POA: Diagnosis not present

## 2023-01-23 DIAGNOSIS — K760 Fatty (change of) liver, not elsewhere classified: Secondary | ICD-10-CM | POA: Diagnosis not present

## 2023-01-23 DIAGNOSIS — E7800 Pure hypercholesterolemia, unspecified: Secondary | ICD-10-CM | POA: Diagnosis not present

## 2023-01-23 DIAGNOSIS — G4733 Obstructive sleep apnea (adult) (pediatric): Secondary | ICD-10-CM | POA: Diagnosis not present

## 2023-01-23 NOTE — Telephone Encounter (Signed)
RTC to [facility] message left that the Clinics had returned her call.

## 2023-01-23 NOTE — Telephone Encounter (Signed)
[facility] OT calling for [facility].  Please call back to speak with [facility] @ [phone]

## 2023-01-27 NOTE — Progress Notes (Signed)
Internal Medicine Clinic Attending  I reviewed the AWV findings.  I agree with the assessment, diagnosis, and plan of care documented in the AWV note.     

## 2023-01-28 DIAGNOSIS — Z4781 Encounter for orthopedic aftercare following surgical amputation: Secondary | ICD-10-CM | POA: Diagnosis not present

## 2023-01-28 DIAGNOSIS — M160 Bilateral primary osteoarthritis of hip: Secondary | ICD-10-CM | POA: Diagnosis not present

## 2023-01-28 DIAGNOSIS — H3411 Central retinal artery occlusion, right eye: Secondary | ICD-10-CM | POA: Diagnosis not present

## 2023-01-28 DIAGNOSIS — K760 Fatty (change of) liver, not elsewhere classified: Secondary | ICD-10-CM | POA: Diagnosis not present

## 2023-01-28 DIAGNOSIS — G4733 Obstructive sleep apnea (adult) (pediatric): Secondary | ICD-10-CM | POA: Diagnosis not present

## 2023-01-28 DIAGNOSIS — E1142 Type 2 diabetes mellitus with diabetic polyneuropathy: Secondary | ICD-10-CM | POA: Diagnosis not present

## 2023-01-28 DIAGNOSIS — L03116 Cellulitis of left lower limb: Secondary | ICD-10-CM | POA: Diagnosis not present

## 2023-01-28 DIAGNOSIS — Z89512 Acquired absence of left leg below knee: Secondary | ICD-10-CM | POA: Diagnosis not present

## 2023-01-28 DIAGNOSIS — E7800 Pure hypercholesterolemia, unspecified: Secondary | ICD-10-CM | POA: Diagnosis not present

## 2023-01-29 DIAGNOSIS — H3411 Central retinal artery occlusion, right eye: Secondary | ICD-10-CM | POA: Diagnosis not present

## 2023-01-29 DIAGNOSIS — L03116 Cellulitis of left lower limb: Secondary | ICD-10-CM | POA: Diagnosis not present

## 2023-01-29 DIAGNOSIS — M160 Bilateral primary osteoarthritis of hip: Secondary | ICD-10-CM | POA: Diagnosis not present

## 2023-01-29 DIAGNOSIS — Z4781 Encounter for orthopedic aftercare following surgical amputation: Secondary | ICD-10-CM | POA: Diagnosis not present

## 2023-01-29 DIAGNOSIS — K760 Fatty (change of) liver, not elsewhere classified: Secondary | ICD-10-CM | POA: Diagnosis not present

## 2023-01-29 DIAGNOSIS — E1142 Type 2 diabetes mellitus with diabetic polyneuropathy: Secondary | ICD-10-CM | POA: Diagnosis not present

## 2023-01-29 DIAGNOSIS — E7800 Pure hypercholesterolemia, unspecified: Secondary | ICD-10-CM | POA: Diagnosis not present

## 2023-01-29 DIAGNOSIS — Z89512 Acquired absence of left leg below knee: Secondary | ICD-10-CM | POA: Diagnosis not present

## 2023-01-29 DIAGNOSIS — G4733 Obstructive sleep apnea (adult) (pediatric): Secondary | ICD-10-CM | POA: Diagnosis not present

## 2023-01-31 ENCOUNTER — Ambulatory Visit (INDEPENDENT_AMBULATORY_CARE_PROVIDER_SITE_OTHER): Payer: Medicare HMO | Admitting: Orthopedic Surgery

## 2023-01-31 DIAGNOSIS — G4733 Obstructive sleep apnea (adult) (pediatric): Secondary | ICD-10-CM | POA: Diagnosis not present

## 2023-01-31 DIAGNOSIS — Z4781 Encounter for orthopedic aftercare following surgical amputation: Secondary | ICD-10-CM | POA: Diagnosis not present

## 2023-01-31 DIAGNOSIS — K760 Fatty (change of) liver, not elsewhere classified: Secondary | ICD-10-CM | POA: Diagnosis not present

## 2023-01-31 DIAGNOSIS — M160 Bilateral primary osteoarthritis of hip: Secondary | ICD-10-CM | POA: Diagnosis not present

## 2023-01-31 DIAGNOSIS — H3411 Central retinal artery occlusion, right eye: Secondary | ICD-10-CM | POA: Diagnosis not present

## 2023-01-31 DIAGNOSIS — L03116 Cellulitis of left lower limb: Secondary | ICD-10-CM | POA: Diagnosis not present

## 2023-01-31 DIAGNOSIS — E7800 Pure hypercholesterolemia, unspecified: Secondary | ICD-10-CM | POA: Diagnosis not present

## 2023-01-31 DIAGNOSIS — E1142 Type 2 diabetes mellitus with diabetic polyneuropathy: Secondary | ICD-10-CM | POA: Diagnosis not present

## 2023-01-31 DIAGNOSIS — Z89512 Acquired absence of left leg below knee: Secondary | ICD-10-CM | POA: Diagnosis not present

## 2023-02-04 ENCOUNTER — Other Ambulatory Visit: Payer: Self-pay | Admitting: Orthopedic Surgery

## 2023-02-04 ENCOUNTER — Telehealth: Payer: Self-pay | Admitting: Orthopedic Surgery

## 2023-02-04 MED ORDER — HYDROCODONE-ACETAMINOPHEN 5-325 MG PO TABS: 1.0000 | ORAL_TABLET | Freq: Four times a day (QID) | ORAL | 0 refills | Status: DC | PRN

## 2023-02-04 NOTE — Telephone Encounter (Signed)
Pt called requesting refill of hydrocodone. Please send to [facility] [address]. Pt phone number is [phone].

## 2023-02-05 DIAGNOSIS — H3411 Central retinal artery occlusion, right eye: Secondary | ICD-10-CM | POA: Diagnosis not present

## 2023-02-05 DIAGNOSIS — M160 Bilateral primary osteoarthritis of hip: Secondary | ICD-10-CM | POA: Diagnosis not present

## 2023-02-05 DIAGNOSIS — L03116 Cellulitis of left lower limb: Secondary | ICD-10-CM | POA: Diagnosis not present

## 2023-02-05 DIAGNOSIS — Z4781 Encounter for orthopedic aftercare following surgical amputation: Secondary | ICD-10-CM | POA: Diagnosis not present

## 2023-02-05 DIAGNOSIS — K760 Fatty (change of) liver, not elsewhere classified: Secondary | ICD-10-CM | POA: Diagnosis not present

## 2023-02-05 DIAGNOSIS — G4733 Obstructive sleep apnea (adult) (pediatric): Secondary | ICD-10-CM | POA: Diagnosis not present

## 2023-02-05 DIAGNOSIS — E1142 Type 2 diabetes mellitus with diabetic polyneuropathy: Secondary | ICD-10-CM | POA: Diagnosis not present

## 2023-02-05 DIAGNOSIS — Z89512 Acquired absence of left leg below knee: Secondary | ICD-10-CM | POA: Diagnosis not present

## 2023-02-05 DIAGNOSIS — E7800 Pure hypercholesterolemia, unspecified: Secondary | ICD-10-CM | POA: Diagnosis not present

## 2023-02-07 DIAGNOSIS — L03116 Cellulitis of left lower limb: Secondary | ICD-10-CM | POA: Diagnosis not present

## 2023-02-07 DIAGNOSIS — K760 Fatty (change of) liver, not elsewhere classified: Secondary | ICD-10-CM | POA: Diagnosis not present

## 2023-02-07 DIAGNOSIS — E7800 Pure hypercholesterolemia, unspecified: Secondary | ICD-10-CM | POA: Diagnosis not present

## 2023-02-07 DIAGNOSIS — G4733 Obstructive sleep apnea (adult) (pediatric): Secondary | ICD-10-CM | POA: Diagnosis not present

## 2023-02-07 DIAGNOSIS — M160 Bilateral primary osteoarthritis of hip: Secondary | ICD-10-CM | POA: Diagnosis not present

## 2023-02-07 DIAGNOSIS — H3411 Central retinal artery occlusion, right eye: Secondary | ICD-10-CM | POA: Diagnosis not present

## 2023-02-07 DIAGNOSIS — Z89512 Acquired absence of left leg below knee: Secondary | ICD-10-CM | POA: Diagnosis not present

## 2023-02-07 DIAGNOSIS — E1142 Type 2 diabetes mellitus with diabetic polyneuropathy: Secondary | ICD-10-CM | POA: Diagnosis not present

## 2023-02-07 DIAGNOSIS — Z4781 Encounter for orthopedic aftercare following surgical amputation: Secondary | ICD-10-CM | POA: Diagnosis not present

## 2023-02-08 ENCOUNTER — Other Ambulatory Visit: Payer: Self-pay | Admitting: Pharmacist

## 2023-02-08 MED ORDER — WARFARIN SODIUM 5 MG PO TABS: ORAL_TABLET | ORAL | 3 refills | Status: DC

## 2023-02-11 ENCOUNTER — Ambulatory Visit (INDEPENDENT_AMBULATORY_CARE_PROVIDER_SITE_OTHER): Payer: Medicare HMO | Admitting: Pharmacist

## 2023-02-11 DIAGNOSIS — E1142 Type 2 diabetes mellitus with diabetic polyneuropathy: Secondary | ICD-10-CM | POA: Diagnosis not present

## 2023-02-11 DIAGNOSIS — Z7901 Long term (current) use of anticoagulants: Secondary | ICD-10-CM

## 2023-02-11 DIAGNOSIS — Z89512 Acquired absence of left leg below knee: Secondary | ICD-10-CM | POA: Diagnosis not present

## 2023-02-11 DIAGNOSIS — G4733 Obstructive sleep apnea (adult) (pediatric): Secondary | ICD-10-CM | POA: Diagnosis not present

## 2023-02-11 DIAGNOSIS — H3411 Central retinal artery occlusion, right eye: Secondary | ICD-10-CM | POA: Diagnosis not present

## 2023-02-11 DIAGNOSIS — H349 Unspecified retinal vascular occlusion: Secondary | ICD-10-CM | POA: Diagnosis not present

## 2023-02-11 DIAGNOSIS — L03116 Cellulitis of left lower limb: Secondary | ICD-10-CM | POA: Diagnosis not present

## 2023-02-11 DIAGNOSIS — M160 Bilateral primary osteoarthritis of hip: Secondary | ICD-10-CM | POA: Diagnosis not present

## 2023-02-11 DIAGNOSIS — K760 Fatty (change of) liver, not elsewhere classified: Secondary | ICD-10-CM | POA: Diagnosis not present

## 2023-02-11 DIAGNOSIS — E7800 Pure hypercholesterolemia, unspecified: Secondary | ICD-10-CM | POA: Diagnosis not present

## 2023-02-11 DIAGNOSIS — Z4781 Encounter for orthopedic aftercare following surgical amputation: Secondary | ICD-10-CM | POA: Diagnosis not present

## 2023-02-11 LAB — POCT INR: INR: 3 (ref 2.0–3.0)

## 2023-02-11 NOTE — Patient Instructions (Signed)
Patient instructed to take medications as defined in the Anti-coagulation Track section of this encounter.  Patient instructed to take today's dose.  Patient instructed to take 2 of your 5mg  strength peach-colored warfarin tablets by mouth, once-daily at 4:00pm, Monday through Saturday. On Sundays, take 2 and 1/2 of your tablets.  Patient verbalized understanding of these instructions.

## 2023-02-11 NOTE — Progress Notes (Signed)
Anticoagulation Management Gregory May is a 62 y.o. male who reports to the clinic for monitoring of warfarin treatment.    Indication: Retinal artery occlusion, central  Right retinal embolus  Long term (current) use of anticoagulants  Duration: indefinite Supervising physician:  Mercie Eon, MD  Anticoagulation Clinic Visit History: Patient does not report signs/symptoms of bleeding or thromboembolism  Other recent changes: No diet, medications, lifestyle changes.  Anticoagulation Episode Summary     Current INR goal:  2.0-3.0  TTR:  59.2 % (9.6 mo)  Next INR check:  03/04/2023  INR from last check:  3.0 (02/11/2023)  Weekly max warfarin dose:    Target end date:    INR check location:    Preferred lab:    Send INR reminders to:     Indications   Pulmonary emboli (HCC) (Resolved) [I26.99] DVT (deep venous thrombosis) (HCC) (Resolved) [I82.409] Retinal artery occlusion central [H34.10] Right retinal embolus [H34.9] Long term (current) use of anticoagulants [Z79.01]        Comments:           No Known Allergies  Current Outpatient Medications:    Accu-Chek FastClix Lancets MISC, Use Accu Chek Fastclix lancets to check blood sugar three times daily. DX:E11.65, Disp: 300 each, Rfl: 2   ACCU-CHEK GUIDE test strip, TEST BLOOD SUGAR THREE TIMES DAILY AS DIRECTED, Disp: 300 strip, Rfl: 3   acetaminophen (TYLENOL) 500 MG tablet, Take 1,000 mg by mouth as needed for mild pain or moderate pain., Disp: , Rfl:    alum & mag hydroxide-simeth (MAALOX/MYLANTA) 200-200-20 MG/5ML suspension, Take 15-30 mLs by mouth every 2 (two) hours as needed for indigestion., Disp: 355 mL, Rfl: 0   APPLE CIDER VINEGAR PO, Take 1 tablet by mouth daily., Disp: , Rfl:    ascorbic acid (VITAMIN C) 1000 MG tablet, Take 1 tablet (1,000 mg total) by mouth daily., Disp: , Rfl:    bisacodyl (DULCOLAX) 5 MG EC tablet, Take 1 tablet (5 mg total) by mouth daily as needed for moderate constipation.,  Disp: 30 tablet, Rfl: 0   cephALEXin (KEFLEX) 500 MG capsule, Take 1 capsule (500 mg total) by mouth 3 (three) times daily., Disp: 30 capsule, Rfl: 0   ciprofloxacin (CIPRO) 500 MG tablet, Take 1 tablet (500 mg total) by mouth 2 (two) times daily., Disp: 20 tablet, Rfl: 0   empagliflozin (JARDIANCE) 25 MG TABS tablet, Take 1 tablet (25 mg total) by mouth daily., Disp: 90 tablet, Rfl: 3   fluticasone (FLONASE) 50 MCG/ACT nasal spray, USE 2 SPRAY(S) IN EACH NOSTRIL AT BEDTIME AS NEEDED FOR  SINUS  DRAINAGE (Patient taking differently: Place 1-2 sprays into both nostrils daily.), Disp: 16 g, Rfl: 0   gabapentin (NEURONTIN) 300 MG capsule, TAKE 1 CAPSULE FOUR TIMES DAILY AS NEEDED (Patient taking differently: Take 600 mg by mouth as needed (pain).), Disp: 360 capsule, Rfl: 0   guaiFENesin-dextromethorphan (ROBITUSSIN DM) 100-10 MG/5ML syrup, Take 15 mLs by mouth every 4 (four) hours as needed for cough., Disp: 118 mL, Rfl: 0   HYDROcodone-acetaminophen (NORCO/VICODIN) 5-325 MG tablet, Take 1 tablet by mouth every 6 (six) hours as needed for moderate pain., Disp: 30 tablet, Rfl: 0   insulin aspart (NOVOLOG) 100 UNIT/ML injection, Inject 0-9 Units into the skin 3 (three) times daily with meals. Correction coverage: Sensitive (thin, NPO, renal)  CBG < 70: Implement Hypoglycemia Standing Orders and refer to Hypoglycemia Standing Orders sidebar report  CBG 70 - 120: 0 units  CBG 121 -  150: 1 unit  CBG 151 - 200: 2 units  CBG 201 - 250: 3 units  CBG 251 - 300: 5 units  CBG 301 - 350: 7 units  CBG 351 - 400 9 units  CBG > 400  10 units, Disp: 10 mL, Rfl: 11   Insulin Syringes, Disposable, U-100 0.5 ML MISC, Use to inject insulin, Disp: 100 each, Rfl: 2   magnesium citrate SOLN, Take 296 mLs (1 Bottle total) by mouth once as needed for severe constipation., Disp: 195 mL, Rfl:    metFORMIN (GLUCOPHAGE-XR) 750 MG 24 hr tablet, TAKE 2 TABLETS EVERY DAY, Disp: 180 tablet, Rfl: 3   Multiple Vitamin (MULTIVITAMIN WITH  MINERALS) TABS tablet, Take 1 tablet by mouth every morning. Centrum, Disp: , Rfl:    mupirocin ointment (BACTROBAN) 2 %, Place 1 Application into the nose 2 (two) times daily., Disp: 22 g, Rfl: 0   pantoprazole (PROTONIX) 40 MG tablet, Take 1 tablet (40 mg total) by mouth daily., Disp: , Rfl:    phenol (CHLORASEPTIC) 1.4 % LIQD, Use as directed 1 spray in the mouth or throat as needed for throat irritation / pain., Disp: , Rfl: 0   pioglitazone (ACTOS) 30 MG tablet, TAKE 1 TABLET EVERY DAY, Disp: 90 tablet, Rfl: 3   polyethylene glycol (MIRALAX / GLYCOLAX) 17 g packet, Take 17 g by mouth daily as needed for mild constipation., Disp: 14 each, Rfl: 0   prednisoLONE acetate (PRED FORTE) 1 % ophthalmic suspension, Place 1 drop into the right eye every evening., Disp: , Rfl:    rosuvastatin (CRESTOR) 20 MG tablet, TAKE 1 TABLET EVERY DAY, Disp: 90 tablet, Rfl: 3   senna-docusate (SENOKOT-S) 8.6-50 MG tablet, Take 1 tablet by mouth at bedtime as needed for mild constipation., Disp: , Rfl:    warfarin (COUMADIN) 5 MG tablet, Take 2 (two) of your 5 mg peach-colored warfarin tablets on Mondays, Tuesdays,Wednesdays and Fridays. 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 Date of diagnosis February 08 2014 Duration of anticoagulation: 6 months. End date 08/12/2014.  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 Duration of anticoagulation: 8 months. End date 10/11/2014.  Anticoagulant: Lovenox 120 units daily Switched to Eliquis on 05/25/2014 per patient preference    Pulmonary embolism (HCC)    Sebaceous cyst    on back of neck   Social History   Socioeconomic History   Marital status: Single    Spouse name: Not on file   Number of children: 0  Years of education: Not on file   Highest education level: Not on file  Occupational History   Occupation: disabled  Tobacco Use   Smoking status: Never   Smokeless tobacco: Never  Vaping Use   Vaping Use: Never used  Substance and Sexual Activity   Alcohol use: Not Currently    Comment: beer and mixed drink maybe 5  times a month   Drug use: No   Sexual activity: Never  Other Topics Concern   Not on file  Social History Narrative   Not on file   Social Determinants of Health   Financial Resource Strain: Low Risk  (01/16/2023)   Overall Financial Resource Strain (CARDIA)    Difficulty of Paying Living Expenses: Not hard at all  Food Insecurity: No Food Insecurity (01/16/2023)   Hunger Vital Sign    Worried About Running Out of Food in the Last Year: Never true    Ran Out of Food in the Last Year: Never true  Transportation Needs: No Transportation Needs (01/16/2023)   PRAPARE - Administrator, Civil Service (Medical): No    Lack of Transportation (Non-Medical): No  Physical Activity: Inactive (01/16/2023)   Exercise Vital Sign    Days of Exercise per Week: 0 days    Minutes of Exercise per Session: 0 min  Stress: No Stress Concern Present (01/16/2023)   [employer] of Occupational Health - Occupational Stress Questionnaire    Feeling of Stress : Only a little  Social  Connections: Socially Isolated (01/16/2023)   Social Connection and Isolation Panel [NHANES]    Frequency of Communication with Friends and Family: More than three times a week    Frequency of Social Gatherings with Friends and Family: Twice a week    Attends Religious Services: Never    Database administrator or Organizations: No    Attends Engineer, structural: Never    Marital Status: Never married   Family History  Problem Relation Age of Onset   Breast cancer Mother    Cancer Mother        small intestine   Liver cancer Mother    Diabetes Mother    Diabetes Father    Diabetes Brother    Hypertension Maternal Grandmother    Heart Problems Maternal Grandmother    Diabetes Paternal Grandmother    Diabetes Paternal Grandfather    Diabetes Brother     ASSESSMENT Recent Results: The most recent result is correlated with 77.5 mg per week: Lab Results  Component Value Date   INR 3.0 02/11/2023   INR 3.0 01/21/2023   INR 2.2 01/08/2023    Anticoagulation Dosing: Description   Take 2 of your 5mg  strength peach-colored warfarin tablets by mouth, once-daily at 4:00pm, Monday through Saturday. On Sundays, take 2 and 1/2 of your tablets.      INR today: Therapeutic  PLAN Weekly dose was decreased by 6% to 72.5 mg per week  Patient Instructions  Patient instructed to take medications as defined in the Anti-coagulation Track section of this encounter.  Patient instructed to take today's dose.  Patient instructed to take 2 of your 5mg  strength peach-colored warfarin tablets by mouth, once-daily at 4:00pm, Monday through Saturday. On Sundays, take 2 and 1/2 of your tablets.  Patient verbalized understanding of these instructions.  Patient advised to contact clinic or seek medical attention if signs/symptoms of bleeding or thromboembolism occur.  Patient verbalized understanding by repeating back information and was advised to contact  me if further medication-related  questions arise. Patient was also provided an information handout.  Follow-up Return in 3 weeks (on 03/04/2023) for Follow up INR.  Elicia Lamp, PharmD, CPP  15 minutes spent face-to-face with the patient during the encounter. 50% of time spent on education, including signs/sx bleeding and clotting, as well as food and drug interactions with warfarin. 50% of time was spent on fingerprick POC INR sample collection,processing, results determination, and documentation in [email or web address].

## 2023-02-12 DIAGNOSIS — G4733 Obstructive sleep apnea (adult) (pediatric): Secondary | ICD-10-CM | POA: Diagnosis not present

## 2023-02-12 DIAGNOSIS — H3411 Central retinal artery occlusion, right eye: Secondary | ICD-10-CM | POA: Diagnosis not present

## 2023-02-12 DIAGNOSIS — M160 Bilateral primary osteoarthritis of hip: Secondary | ICD-10-CM | POA: Diagnosis not present

## 2023-02-12 DIAGNOSIS — E7800 Pure hypercholesterolemia, unspecified: Secondary | ICD-10-CM | POA: Diagnosis not present

## 2023-02-12 DIAGNOSIS — L03116 Cellulitis of left lower limb: Secondary | ICD-10-CM | POA: Diagnosis not present

## 2023-02-12 DIAGNOSIS — K760 Fatty (change of) liver, not elsewhere classified: Secondary | ICD-10-CM | POA: Diagnosis not present

## 2023-02-12 DIAGNOSIS — E1142 Type 2 diabetes mellitus with diabetic polyneuropathy: Secondary | ICD-10-CM | POA: Diagnosis not present

## 2023-02-12 DIAGNOSIS — Z4781 Encounter for orthopedic aftercare following surgical amputation: Secondary | ICD-10-CM | POA: Diagnosis not present

## 2023-02-12 DIAGNOSIS — Z89512 Acquired absence of left leg below knee: Secondary | ICD-10-CM | POA: Diagnosis not present

## 2023-02-12 NOTE — Progress Notes (Signed)
Evaluation and management procedures were performed by the Clinical Pharmacy Practitioner under my supervision and collaboration. I have reviewed the Practitioner's note and chart, and I agree with the management and plan as documented above. ° °

## 2023-02-18 ENCOUNTER — Encounter: Payer: Self-pay | Admitting: Orthopedic Surgery

## 2023-02-18 NOTE — Progress Notes (Signed)
Office Visit Note   Patient: Gregory May           Date of Birth: 11-26-1960           MRN: [id number] Visit Date: 01/31/2023              Requested by: Gwenevere Abbot, MD [address] [place],  [place] [address] PCP: Gwenevere Abbot, MD  Chief Complaint  Patient presents with   Left Leg - Routine Post Op      HPI: Patient is a 62 year old gentleman is 4 months status post left below-knee amputation.  Patient states that symptomatically he feels better.  Assessment & Plan: Visit Diagnoses:  1. Left below-knee amputee [facility])     Plan: Continue Dial soap cleansing compression and range of motion.  Follow-Up Instructions: Return in about 3 weeks (around 02/21/2023).   Ortho Exam  Patient is alert, oriented, no adenopathy, well-dressed, normal affect, normal respiratory effort. Examination patient has developed a new abrasion on the lateral aspect of the left transtibial amputation secondary to an abrasion from his wheelchair.  There is hypergranulation tissue this was touched with silver nitrate.  Patient has decreased dermatitis in the residual limb there is no cellulitis no drainage.  The superficial ulcers are healing.  Imaging: No results found. No images are attached to the encounter.  Labs: Lab Results  Component Value Date   HGBA1C 6.9 (H) 09/24/2022   HGBA1C 7.2 (H) 03/08/2022   HGBA1C 7.2 (H) 11/29/2021   ESRSEDRATE 11 11/15/2021   ESRSEDRATE 25 (H) 08/19/2021   ESRSEDRATE 8 07/15/2019   CRP 0.7 11/17/2021   CRP <0.5 11/15/2021   CRP 1.7 (H) 08/19/2021   REPTSTATUS 10/03/2022 FINAL 09/28/2022   REPTSTATUS 10/03/2022 FINAL 09/28/2022   GRAMSTAIN  08/21/2021    WBC PRESENT,BOTH PMN AND MONONUCLEAR NO ORGANISMS SEEN    CULT  09/28/2022    NO GROWTH 5 DAYS Performed at [facility], [address], [place], [place] [address]    CULT  09/28/2022    NO GROWTH 5 DAYS Performed at [facility], [address], [place], [place]  [address]    [facility] STAPHYLOCOCCUS HOMINIS 08/21/2021     Lab Results  Component Value Date   ALBUMIN 3.8 12/26/2022   ALBUMIN 2.8 (L) 09/28/2022   ALBUMIN 4.1 09/24/2022    Lab Results  Component Value Date   MG 1.9 11/15/2021   Lab Results  Component Value Date   VD25OH 16 (L) 11/05/2013    No results found for: "PREALBUMIN"    Latest Ref Rng & Units 10/03/2022    2:13 AM 10/01/2022    5:02 AM 09/30/2022    4:06 AM  CBC EXTENDED  WBC 4.0 - 10.5 K/uL 12.8  12.6  14.0   RBC 4.22 - 5.81 MIL/uL 4.26  4.25  3.84   Hemoglobin 13.0 - 17.0 g/dL 21.3  08.6  57.8   HCT 39.0 - 52.0 % 39.1  39.4  36.3   Platelets 150 - 400 K/uL 413  369  325      There is no height or weight on file to calculate BMI.  Orders:  No orders of the defined types were placed in this encounter.  No orders of the defined types were placed in this encounter.    Procedures: No procedures performed  Clinical Data: No additional findings.  ROS:  All other systems negative, except as noted in the HPI. Review of Systems  Objective:  Vital Signs: There were no vitals taken for this visit.  Specialty Comments:  No specialty comments available.  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) 10/03/2022   Lower extremity cellulitis 09/29/2022   Acute osteomyelitis of left foot (HCC) 09/29/2022   PFO with atrial septal aneurysm    Long term (current) use of anticoagulants 02/26/2022   Right retinal embolus 01/15/2022   DM type 2 with diabetic peripheral neuropathy (HCC) 01/15/2022   Obstructive sleep apnea 01/15/2022   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   Transportation insecurity 11/23/2021   Retinal artery occlusion, central 11/15/2021   Epidermal inclusion cyst 10/18/2021   Amputated finger 09/05/2021   Osteomyelitis of finger of right hand (HCC) 08/19/2021   Hepatic steatosis 06/24/2020   Back  pain 04/20/2020   Left knee pain 04/20/2020   Allergic sinusitis 04/20/2020   Elevated liver enzymes 04/20/2020   Cutaneous abscess of left foot    Diastasis of rectus abdominis 07/16/2019   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 10/07/2018   Essential hypertension 06/19/2018   Peripheral neuropathy 12/19/2017   Left below-knee amputee (HCC) 04/17/2017   Carpal tunnel syndrome 03/18/2017   Healthcare maintenance 06/21/2015   Status post below knee amputation of right lower extremity (HCC) 06/07/2015   Diabetes mellitus with carpal tunnel syndrome (HCC) 12/09/2014   DJD (degenerative joint disease) of knee 07/21/2014   Osteoarthritis, hip, bilateral 05/25/2014   Onychomycosis 10/14/2013   Pure hypercholesterolemia 09/25/2013   Type 2 diabetes with complication (HCC) 09/15/1997   Past Medical History:  Diagnosis Date   Arthritis    bilateral hips   Cutaneous abscess of left foot    Deep vein thrombosis (DVT) (HCC)    Diabetes mellitus    type II   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 Date of diagnosis February 08 2014 Duration of anticoagulation: 6 months. End date 08/12/2014.  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 Duration of anticoagulation: 8 months. End date 10/11/2014.  Anticoagulant: Lovenox 120 units daily Switched to Eliquis on 05/25/2014 per patient preference    Pulmonary embolism (HCC)    Sebaceous cyst    on back of neck    Family History  Problem Relation  Age of Onset   Breast cancer Mother    Cancer Mother        small intestine   Liver cancer Mother    Diabetes Mother    Diabetes Father    Diabetes Brother    Hypertension Maternal Grandmother    Heart Problems Maternal Grandmother    Diabetes Paternal Grandmother    Diabetes Paternal Grandfather    Diabetes Brother     Past Surgical History:  Procedure Laterality Date   AMPUTATION Right 06/03/2015   Procedure: Right Below Knee Amputation;  Surgeon: Nadara Mustard, MD;  Location: [facility] OR;  Service: Orthopedics;  Laterality: Right;  AMPUTATION Left 07/24/2019   Procedure: LEFT FOOT 3RD RAY AMPUTATION;  Surgeon: Nadara Mustard, MD;  Location: [facility] OR;  Service: Orthopedics;  Laterality: Left;   AMPUTATION Left 06/23/2021   Procedure: LEFT 2ND TOE AMPUTATION;  Surgeon: Nadara Mustard, MD;  Location[facility] OR;  Service: Orthopedics;  Laterality: Left;   AMPUTATION Right 08/21/2021   Procedure: AMPUTATION RIGHT INDEX FINGER;  Surgeon: Gomez Cleverly, MD;  Location: [facility] OR;  Service: Orthopedics;  Laterality: Right;   AMPUTATION Left 09/29/2022   Procedure: AMPUTATION BELOW KNEE;  Surgeon: Nadara Mustard, MD;  Location: [facility] OR;  Service: Orthopedics;  Laterality: Left;   BUBBLE STUDY  01/24/2022   Procedure: BUBBLE STUDY;  Surgeon: Wendall Stade, MD;  Location: [facility] ENDOSCOPY;  Service: Cardiovascular;;   CLOSED REDUCTION WITH HUMER PIN INSERTION  1974   left hip   HARDWARE REMOVAL Left 07/21/2014   Procedure: HARDWARE REMOVAL;  Surgeon: Loreta Ave, MD;  Location: [facility] OR;  Service: Orthopedics;  Laterality: Left;   I & D EXTREMITY Right 08/21/2021   Procedure: IRRIGATION AND DEBRIDEMENT RIGHT INDEX FINGER;  Surgeon: Gomez Cleverly, MD;  Location: [facility] OR;  Service: Orthopedics;  Laterality: Right;   PATENT FORAMEN OVALE(PFO) CLOSURE N/A 02/28/2022   Procedure: PATENT FORAMEN OVALE(PFO) CLOSURE;  Surgeon: Tonny Bollman, MD;  Location: [facility] INVASIVE CV LAB;  Service: Cardiovascular;  Laterality: N/A;    ROTATOR CUFF REPAIR Right 2005 (approx)   TEE WITHOUT CARDIOVERSION N/A 01/24/2022   Procedure: TRANSESOPHAGEAL ECHOCARDIOGRAM (TEE);  Surgeon: Wendall Stade, MD;  Location: [facility] ENDOSCOPY;  Service: Cardiovascular;  Laterality: N/A;   TOTAL HIP ARTHROPLASTY Left 07/21/2014   Procedure: TOTAL HIP ARTHROPLASTY ANTERIOR APPROACH;  Surgeon: Loreta Ave, MD;  Location: [facility] OR;  Service: Orthopedics;  Laterality: Left;   TOTAL HIP ARTHROPLASTY Right 2006 (approx)   right hip replaced   Social History   Occupational History   Occupation: disabled  Tobacco Use   Smoking status: Never   Smokeless tobacco: Never  Vaping Use   Vaping Use: Never used  Substance and Sexual Activity   Alcohol use: Not Currently    Comment: beer and mixed drink maybe 5  times a month   Drug use: No   Sexual activity: Never

## 2023-02-21 ENCOUNTER — Ambulatory Visit (INDEPENDENT_AMBULATORY_CARE_PROVIDER_SITE_OTHER): Payer: Medicare HMO | Admitting: Orthopedic Surgery

## 2023-02-21 DIAGNOSIS — Z4781 Encounter for orthopedic aftercare following surgical amputation: Secondary | ICD-10-CM | POA: Diagnosis not present

## 2023-02-21 DIAGNOSIS — L03116 Cellulitis of left lower limb: Secondary | ICD-10-CM | POA: Diagnosis not present

## 2023-02-21 DIAGNOSIS — G4733 Obstructive sleep apnea (adult) (pediatric): Secondary | ICD-10-CM | POA: Diagnosis not present

## 2023-02-21 DIAGNOSIS — M160 Bilateral primary osteoarthritis of hip: Secondary | ICD-10-CM | POA: Diagnosis not present

## 2023-02-21 DIAGNOSIS — Z89512 Acquired absence of left leg below knee: Secondary | ICD-10-CM | POA: Diagnosis not present

## 2023-02-21 DIAGNOSIS — Z89511 Acquired absence of right leg below knee: Secondary | ICD-10-CM

## 2023-02-21 DIAGNOSIS — S88111A Complete traumatic amputation at level between knee and ankle, right lower leg, initial encounter: Secondary | ICD-10-CM

## 2023-02-21 DIAGNOSIS — H3411 Central retinal artery occlusion, right eye: Secondary | ICD-10-CM | POA: Diagnosis not present

## 2023-02-21 DIAGNOSIS — E1142 Type 2 diabetes mellitus with diabetic polyneuropathy: Secondary | ICD-10-CM | POA: Diagnosis not present

## 2023-02-21 DIAGNOSIS — K760 Fatty (change of) liver, not elsewhere classified: Secondary | ICD-10-CM | POA: Diagnosis not present

## 2023-02-21 DIAGNOSIS — E7800 Pure hypercholesterolemia, unspecified: Secondary | ICD-10-CM | POA: Diagnosis not present

## 2023-02-21 MED ORDER — HYDROCODONE-ACETAMINOPHEN 5-325 MG PO TABS: 1.0000 | ORAL_TABLET | Freq: Four times a day (QID) | ORAL | 0 refills | Status: DC | PRN

## 2023-02-22 DIAGNOSIS — G4733 Obstructive sleep apnea (adult) (pediatric): Secondary | ICD-10-CM | POA: Diagnosis not present

## 2023-02-22 DIAGNOSIS — L03116 Cellulitis of left lower limb: Secondary | ICD-10-CM | POA: Diagnosis not present

## 2023-02-22 DIAGNOSIS — Z4781 Encounter for orthopedic aftercare following surgical amputation: Secondary | ICD-10-CM | POA: Diagnosis not present

## 2023-02-22 DIAGNOSIS — K760 Fatty (change of) liver, not elsewhere classified: Secondary | ICD-10-CM | POA: Diagnosis not present

## 2023-02-22 DIAGNOSIS — M160 Bilateral primary osteoarthritis of hip: Secondary | ICD-10-CM | POA: Diagnosis not present

## 2023-02-22 DIAGNOSIS — Z89512 Acquired absence of left leg below knee: Secondary | ICD-10-CM | POA: Diagnosis not present

## 2023-02-22 DIAGNOSIS — E7800 Pure hypercholesterolemia, unspecified: Secondary | ICD-10-CM | POA: Diagnosis not present

## 2023-02-22 DIAGNOSIS — H3411 Central retinal artery occlusion, right eye: Secondary | ICD-10-CM | POA: Diagnosis not present

## 2023-02-22 DIAGNOSIS — E1142 Type 2 diabetes mellitus with diabetic polyneuropathy: Secondary | ICD-10-CM | POA: Diagnosis not present

## 2023-02-26 DIAGNOSIS — E1142 Type 2 diabetes mellitus with diabetic polyneuropathy: Secondary | ICD-10-CM | POA: Diagnosis not present

## 2023-02-26 DIAGNOSIS — Z89512 Acquired absence of left leg below knee: Secondary | ICD-10-CM | POA: Diagnosis not present

## 2023-02-26 DIAGNOSIS — H3411 Central retinal artery occlusion, right eye: Secondary | ICD-10-CM | POA: Diagnosis not present

## 2023-02-26 DIAGNOSIS — Z4781 Encounter for orthopedic aftercare following surgical amputation: Secondary | ICD-10-CM | POA: Diagnosis not present

## 2023-02-26 DIAGNOSIS — E7800 Pure hypercholesterolemia, unspecified: Secondary | ICD-10-CM | POA: Diagnosis not present

## 2023-02-26 DIAGNOSIS — M160 Bilateral primary osteoarthritis of hip: Secondary | ICD-10-CM | POA: Diagnosis not present

## 2023-02-26 DIAGNOSIS — K760 Fatty (change of) liver, not elsewhere classified: Secondary | ICD-10-CM | POA: Diagnosis not present

## 2023-02-26 DIAGNOSIS — G4733 Obstructive sleep apnea (adult) (pediatric): Secondary | ICD-10-CM | POA: Diagnosis not present

## 2023-02-26 DIAGNOSIS — Z7901 Long term (current) use of anticoagulants: Secondary | ICD-10-CM | POA: Diagnosis not present

## 2023-02-28 ENCOUNTER — Ambulatory Visit (INDEPENDENT_AMBULATORY_CARE_PROVIDER_SITE_OTHER): Payer: Medicare HMO | Admitting: Orthopedic Surgery

## 2023-02-28 DIAGNOSIS — E1142 Type 2 diabetes mellitus with diabetic polyneuropathy: Secondary | ICD-10-CM | POA: Diagnosis not present

## 2023-02-28 DIAGNOSIS — Z89512 Acquired absence of left leg below knee: Secondary | ICD-10-CM | POA: Diagnosis not present

## 2023-02-28 DIAGNOSIS — Z4781 Encounter for orthopedic aftercare following surgical amputation: Secondary | ICD-10-CM | POA: Diagnosis not present

## 2023-02-28 DIAGNOSIS — K760 Fatty (change of) liver, not elsewhere classified: Secondary | ICD-10-CM | POA: Diagnosis not present

## 2023-02-28 DIAGNOSIS — E7800 Pure hypercholesterolemia, unspecified: Secondary | ICD-10-CM | POA: Diagnosis not present

## 2023-02-28 DIAGNOSIS — M160 Bilateral primary osteoarthritis of hip: Secondary | ICD-10-CM | POA: Diagnosis not present

## 2023-02-28 DIAGNOSIS — H3411 Central retinal artery occlusion, right eye: Secondary | ICD-10-CM | POA: Diagnosis not present

## 2023-02-28 DIAGNOSIS — Z7901 Long term (current) use of anticoagulants: Secondary | ICD-10-CM | POA: Diagnosis not present

## 2023-02-28 DIAGNOSIS — G4733 Obstructive sleep apnea (adult) (pediatric): Secondary | ICD-10-CM | POA: Diagnosis not present

## 2023-02-28 NOTE — Progress Notes (Unsigned)
[facility]   MULTIDISCIPLINARY HEART VALVE CLINIC                                     Cardiology Office Note:    Date:  03/05/2023   ID:  Gregory May, DOB [birth year], MRN [id number]  PCP:  Gwenevere Abbot, MD  [facility] Cardiologist:  Dr. Excell Seltzer, MD (PFO closure only)  Referring MD: Gwenevere Abbot, MD   Chief Complaint  Patient presents with   Follow-up    1 year s/p PFO   History of Present Illness:    Gregory May is a 62 y.o. male with a hx of DVT/PE in 2015 treated with 8 months of OAC, uncontrolled DMT2, PAD s/p R BKA, HTN, HLD, and recent CVA with diagnosis of a chronic DVT and PFO with atrial septal aneurysm who presents to clinic for 1 year f/u for PFO.     The patient was hospitalized 11/2021 with sudden loss of vision in the right eye. 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 CTA study showed no evidence of carotid occlusive disease. The patient was noted to have aortic atherosclerosis involving the aortic arch. The patient's hypercoagulable panel was negative and notably absent for lupus anticoagulant, factor V Leiden mutation, and prothrombin gene mutation.  Protein C and protein S were normal. Echo with bubble showed a PFO with an atrial septal aneurysm. The patient was also diagnosed with chronic lower extremity DVT during the same hospitalization. He has followed up with neurology and insurance did not approve the suggested 30 day cardiac monitoring.    He was seen in the office by Dr Excell Seltzer on 12/02/21 for consideration of PFO closure. While his ROPE Score was notably low at 4, suggesting low probability of PFO-related stroke, the clinical scenario with suggestion of cardioembolic event, diagnosis of chronic DVT, and PFO with atrial septal aneurysm is concerning for the possibility of a PFO-related stroke. It was recommended that he continue on Eliquis and proceed with  TEE to further define his PFO anatomy, quantitate the size of PFO and shunt, and confirm the presence of atrial septal aneurysm.    TEE performed 01/24/22 which showed large mobile atrial septal aneurysm with large PFO at inferior/anterior margin with ling tunnel gap as large as 0.75 cm.    He underwent PFO closure with 25 mm Abbott Talacen PFO occluder device with Dr. Excell Seltzer on 02/28/22. He was continued on Coumadin and ASA was added to his regimen x 3 months.   He is here today alone and reports that he "could be better". From a CV standpoint, he is well however since I last saw him he has undergone another amputation. He follows very closely with Dr. Lajoyce Corners and is hoping that LLE infection is now clear. He has had no recurrent neuro symptoms. Denies chest pain, SOB, palpitations, LE edema, orthopnea, PND, dizziness, or syncope. Denies bleeding in stool or urine.     Past Medical History:  Diagnosis Date   Arthritis    bilateral hips   Cutaneous abscess of left foot    Deep vein thrombosis (DVT) (HCC)    Diabetes mellitus    type II   Diabetic ulcer of heel (HCC)    Right heel   DJD (degenerative joint disease)    DVT (deep venous thrombosis) (HCC) 02/08/2014  Proximal provoked. Date of diagnosis February 08 2014 Duration of anticoagulation: 6 months. End date 08/12/2014.  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 Duration of anticoagulation: 8 months. End date 10/11/2014.  Anticoagulant: Lovenox 120 units daily Switched to Eliquis on 05/25/2014 per patient preference    Pulmonary embolism (HCC)    Sebaceous cyst    on back of neck     Past Surgical History:  Procedure Laterality Date   AMPUTATION Right 06/03/2015   Procedure: Right Below Knee Amputation;  Surgeon: Nadara Mustard, MD;  Location: [facility] OR;  Service: Orthopedics;  Laterality: Right;   AMPUTATION Left 07/24/2019   Procedure: LEFT FOOT 3RD RAY AMPUTATION;  Surgeon: Nadara Mustard, MD;  Location: [facility] OR;  Service: Orthopedics;  Laterality: Left;   AMPUTATION Left 06/23/2021   Procedure: LEFT 2ND TOE AMPUTATION;  Surgeon: Nadara Mustard, MD;  Location: [facility] OR;  Service: Orthopedics;  Laterality: Left;   AMPUTATION Right 08/21/2021   Procedure: AMPUTATION RIGHT INDEX FINGER;  Surgeon: Gomez Cleverly, MD;  Location: [facility] OR;  Service: Orthopedics;  Laterality: Right;   AMPUTATION Left 09/29/2022   Procedure: AMPUTATION BELOW KNEE;  Surgeon: Nadara Mustard, MD;  Location: [facility] OR;  Service: Orthopedics;  Laterality: Left;   BUBBLE STUDY  01/24/2022   Procedure: BUBBLE STUDY;  Surgeon: Wendall Stade, MD;  Location: [facility] ENDOSCOPY;  Service: Cardiovascular;;   CLOSED REDUCTION WITH HUMER PIN INSERTION  1974   left hip   HARDWARE REMOVAL Left 07/21/2014   Procedure: HARDWARE REMOVAL;  Surgeon: Loreta Ave, MD;  Location: [facility] OR;  Service: Orthopedics;  Laterality: Left;   I & D EXTREMITY Right 08/21/2021   Procedure: IRRIGATION AND DEBRIDEMENT RIGHT INDEX FINGER;  Surgeon: Gomez Cleverly, MD;  Location: [facility] OR;  Service: Orthopedics;  Laterality: Right;   PATENT FORAMEN OVALE(PFO) CLOSURE N/A 02/28/2022   Procedure: PATENT FORAMEN OVALE(PFO) CLOSURE;  Surgeon: Tonny Bollman, MD;  Location: [facility] INVASIVE CV LAB;  Service: Cardiovascular;  Laterality: N/A;   ROTATOR CUFF REPAIR Right 2005 (approx)   TEE WITHOUT CARDIOVERSION N/A 01/24/2022   Procedure: TRANSESOPHAGEAL ECHOCARDIOGRAM (TEE);  Surgeon: Wendall Stade, MD;  Location: [facility] ENDOSCOPY;  Service: Cardiovascular;  Laterality: N/A;   TOTAL HIP ARTHROPLASTY Left 07/21/2014   Procedure: TOTAL HIP ARTHROPLASTY ANTERIOR APPROACH;   Surgeon: Loreta Ave, MD;  Location: [facility] OR;  Service: Orthopedics;  Laterality: Left;   TOTAL HIP ARTHROPLASTY Right 2006 (approx)   right hip replaced    Current Medications: Current Meds  Medication Sig   Accu-Chek FastClix Lancets MISC Use Accu Chek Fastclix lancets to check blood sugar three times daily. DX:E11.65   acetaminophen (TYLENOL) 500 MG tablet Take 1,000 mg by mouth as needed for mild pain or moderate pain.   APPLE CIDER VINEGAR PO Take 1 tablet by mouth daily.   ascorbic acid (VITAMIN C) 1000 MG tablet Take 1 tablet (1,000 mg total) by mouth daily.   empagliflozin (JARDIANCE) 25 MG TABS tablet Take 1 tablet (25 mg total) by mouth daily.   fluticasone (FLONASE) 50 MCG/ACT nasal spray USE 2 SPRAY(S) IN [facility]  NOSTRIL AT BEDTIME AS NEEDED FOR  SINUS  DRAINAGE   HYDROcodone-acetaminophen (NORCO/VICODIN) 5-325 MG tablet Take 1 tablet by mouth every 6 (six) hours as needed for moderate pain.   insulin aspart (NOVOLOG) 100 UNIT/ML injection Inject 0-9 Units into the skin 3 (three) times daily with meals. Correction coverage: Sensitive (thin, NPO, renal)  CBG < 70: Implement Hypoglycemia Standing Orders and refer to Hypoglycemia Standing Orders sidebar report  CBG 70 - 120: 0 units  CBG 121 - 150: 1 unit  CBG 151 - 200: 2 units  CBG 201 - 250: 3 units  CBG 251 - 300: 5 units  CBG 301 - 350: 7 units  CBG 351 - 400 9 units  CBG > 400  10 units   Insulin Syringes, Disposable, U-100 0.5 ML MISC Use to inject insulin   metFORMIN (GLUCOPHAGE-XR) 750 MG 24 hr tablet TAKE 2 TABLETS EVERY DAY   Multiple Vitamin (MULTIVITAMIN WITH MINERALS) TABS tablet Take 1 tablet by mouth every morning. Centrum   pioglitazone (ACTOS) 30 MG tablet TAKE 1 TABLET EVERY DAY   prednisoLONE acetate (PRED FORTE) 1 % ophthalmic suspension Place 1 drop into the right eye every evening.   rosuvastatin (CRESTOR) 20 MG tablet TAKE 1 TABLET EVERY DAY   warfarin (COUMADIN) 5 MG tablet Take 2 (two) of your 5 mg  peach-colored warfarin tablets on Mondays, Tuesdays,Wednesdays and Fridays. All OTHER DAYS, (Sundays, Thursdays and Saturdays) take two and one-half ( 2 & 1/2 tablets.)   zinc sulfate 220 (50 Zn) MG capsule Take 1 capsule (220 mg total) by mouth daily.   [DISCONTINUED] ACCU-CHEK GUIDE test strip TEST BLOOD SUGAR THREE TIMES DAILY AS DIRECTED   [DISCONTINUED] gabapentin (NEURONTIN) 300 MG capsule TAKE 1 CAPSULE FOUR TIMES DAILY AS NEEDED     Allergies:   Patient has no known allergies.   Social History   Socioeconomic History   Marital status: Single    Spouse name: Not on file   Number of children: 0   Years of education: Not on file   Highest education level: Not on file  Occupational History   Occupation: disabled  Tobacco Use   Smoking status: Never   Smokeless tobacco: Never  Vaping Use   Vaping status: Never Used  Substance and Sexual Activity   Alcohol use: Not Currently    Comment: beer and mixed drink maybe 5  times a month   Drug use: No   Sexual activity: Never  Other Topics Concern   Not on file  Social History Narrative   Not on file   Social Determinants of Health   Financial Resource Strain: Low Risk  (01/16/2023)   Overall Financial Resource Strain (CARDIA)    Difficulty of Paying Living Expenses: Not hard at all  Food Insecurity: No Food Insecurity (01/16/2023)   Hunger Vital Sign    Worried About Running Out of Food in the Last Year: Never true    Ran Out of Food in the Last Year: Never true  Transportation Needs: No Transportation Needs (01/16/2023)   PRAPARE - Administrator, Civil Service (Medical): No    Lack of Transportation (Non-Medical): No  Physical Activity: Inactive (01/16/2023)   Exercise Vital Sign    Days of Exercise per Week: 0 days    Minutes of Exercise per Session: 0 min  Stress: No Stress Concern Present (01/16/2023)   [employer] of Occupational Health - Occupational Stress Questionnaire    Feeling of Stress : Only a  little  Social Connections: Socially Isolated (01/16/2023)   Social Connection and Isolation Panel [NHANES]    Frequency of Communication with Friends and Family: More than three times a week    Frequency of Social Gatherings with Friends and Family: Twice a week    Attends Religious Services: Never    Database administrator or Organizations: No    Attends Engineer, structural: Never    Marital Status: Never married    Family History: The patient's family history includes Breast cancer in his mother; Cancer in his mother; Diabetes in his brother, brother, father, mother, paternal grandfather, and paternal grandmother; Heart Problems in his maternal grandmother; Hypertension in his maternal grandmother; Liver cancer in his mother.  ROS:   Please see the history of present illness.    All other systems reviewed and are negative.  EKGs/Labs/Other Studies Reviewed:    The following studies were reviewed today:  Cardiac Studies & Procedures   CARDIAC CATHETERIZATION  CARDIAC CATHETERIZATION 02/28/2022  Narrative Successful transcatheter PFO closure under intracardiac echo and fluoroscopic guidance using a 25 mm Abbott Talacen PFO occluder device  Recommendations: 1.  Resume warfarin today, management per PCP 2.  Aspirin 81 mg daily for 3 months, then stop if patient is to remain on warfarin 3.  Limited 2D echocardiogram prior to discharge 4.  SBE prophylaxis x6 months when indicated 5.  Same-day discharge protocol if criteria met     ECHOCARDIOGRAM  ECHOCARDIOGRAM LIMITED BUBBLE STUDY 03/04/2023  Narrative ECHOCARDIOGRAM LIMITED REPORT    Patient Name:   Gregory May Date of Exam: 03/04/2023 Medical Rec #:  [id number]           Height:       72.0 in Accession #:    [id number]          Weight:       187.0 lb Date of Birth:  Jun 27, 1961           BSA:          2.071 m Patient Age:    62 years            BP:           128/72 mmHg Patient Gender: M                    HR:           66 bpm. Exam Location:  [facility]  Procedure: Limited Echo, Saline Contrast Bubble Study and Limited Color Doppler  Indications:    Limited bubble study s/p PFO Closure Q21.1  History:        Patient has prior history of Echocardiogram examinations, most recent 02/28/2022. Risk Factors:Diabetes. Septal Repair:PFO Closure on 02/28/2022.  Sonographer:    Thurman Coyer RDCS Referring Phys: [phone] Dessie Delcarlo D Jashanti Clinkscale  IMPRESSIONS   1. Left ventricular ejection fraction, by estimation, is 55 to 60%. The left ventricle has normal function. 2. Right ventricular systolic function is normal. The right ventricular size is normal. 3. S/p 25 mm Abbott Talacen Occluder device. Device appears well seated. Though there is one series (Clip 23) with possible abnormal color flow, there is a negative bubble study. No evidence of device leak. 4. The aortic valve is tricuspid.  FINDINGS Left Ventricle: Left ventricular ejection fraction, by estimation, is 55 to 60%. The left ventricle has normal function. The left ventricular internal cavity size was normal in size.  Right Ventricle: The right ventricular size is normal. Right  ventricular systolic function is normal.  Aortic Valve: The aortic valve is tricuspid.  IAS/Shunts: The atrial septum is grossly normal. Agitated saline contrast was given intravenously to evaluate for intracardiac shunting. There is no evidence of a patent foramen ovale.  Riley Lam MD Electronically signed by Riley Lam MD Signature Date/Time: 03/04/2023/1:16:45 PM    Final   TEE  ECHO TEE 01/24/2022  Narrative TRANSESOPHOGEAL ECHO REPORT    Patient Name:   Gregory May Date of Exam: 01/24/2022 Medical Rec #:  [id number]           Height:       72.0 in Accession #:    [id number]          Weight:       200.0 lb Date of Birth:  [birth year]           BSA:          2.131 m Patient Age:    61 years            BP:           146/71  mmHg Patient Gender: M                   HR:           56 bpm. Exam Location:  Outpatient  Procedure: Transesophageal Echo, Cardiac Doppler, Color Doppler, 3D Echo and Saline Contrast Bubble Study  Indications:     Stroke  History:         Patient has prior history of Echocardiogram examinations, most recent 11/16/2021. Risk Factors:Hypertension, Diabetes and Dyslipidemia. History of DVT/PE.  Sonographer:     Leta Jungling RDCS Referring Phys:  [phone] MICHAEL COOPER Diagnosing Phys: Charlton Haws MD  PROCEDURE: After discussion of the risks and benefits of a TEE, an informed consent was obtained from the patient. TEE procedure time was 11 minutes. The transesophogeal probe was passed without difficulty through the esophogus of the patient. Imaged were obtained with the patient in a left lateral decubitus position. Sedation performed by different physician. The patient was monitored while under deep sedation. Anesthestetic sedation was provided intravenously by Anesthesiology: 283.96mg  of Propofol. Image quality was good. The patient's vital signs; including heart rate, blood pressure, and oxygen saturation; remained stable throughout the procedure. The patient developed no complications during the procedure.  IMPRESSIONS   1. Large mobile atrial septal aneursym with large PFO at inferior/anterior margin with ling tunnel Gap as large as 0.75 cm. 2. Patient has been referred to Dr Excell Seltzer to consider percutaneous closure in setting of multiple previous TIA;s. 3. Left ventricular ejection fraction, by estimation, is 55 to 60%. The left ventricle has normal function. 4. Right ventricular systolic function is normal. The right ventricular size is normal. 5. Left atrial size was mildly dilated. No left atrial/left atrial appendage thrombus was detected. 6. The mitral valve is abnormal. Mild mitral valve regurgitation. 7. The aortic valve is tricuspid. There is mild calcification of the aortic  valve. Aortic valve regurgitation is not visualized. Aortic valve sclerosis is present, with no evidence of aortic valve stenosis. 8. Evidence of atrial level shunting detected by color flow Doppler. Agitated saline contrast bubble study was positive with shunting observed within 3-6 cardiac cycles suggestive of interatrial shunt.  FINDINGS Left Ventricle: Left ventricular ejection fraction, by estimation, is 55 to 60%. The left ventricle has normal function. The left ventricular internal cavity size was normal in size.  Right Ventricle: The right ventricular size  is normal. Right vetricular wall thickness was not assessed. Right ventricular systolic function is normal.  Left Atrium: Left atrial size was mildly dilated. No left atrial/left atrial appendage thrombus was detected.  Right Atrium: Right atrial size was normal in size.  Pericardium: There is no evidence of pericardial effusion.  Mitral Valve: The mitral valve is abnormal. There is mild thickening of the mitral valve leaflet(s). Mild mitral valve regurgitation.  Tricuspid Valve: The tricuspid valve is normal in structure. Tricuspid valve regurgitation is mild.  Aortic Valve: The aortic valve is tricuspid. There is mild calcification of the aortic valve. Aortic valve regurgitation is not visualized. Aortic valve sclerosis is present, with no evidence of aortic valve stenosis.  Pulmonic Valve: The pulmonic valve was normal in structure. Pulmonic valve regurgitation is trivial.  Aorta: The aortic root is normal in size and structure.  IAS/Shunts: Evidence of atrial level shunting detected by color flow Doppler. Agitated saline contrast was given intravenously to evaluate for intracardiac shunting. Agitated saline contrast bubble study was positive with shunting observed within 3-6 cardiac cycles suggestive of interatrial shunt.  Additional Comments: Large mobile atrial septal aneursym with large PFO at inferior/anterior margin with  ling tunnel Gap as large as 0.75 cm. Patient has been referred to Dr Excell Seltzer to consider percutaneous closure in setting of multiple previous TIA;s.  Charlton Haws MD Electronically signed by Charlton Haws MD Signature Date/Time: 01/24/2022/3:52:16 PM    Final            EKG:  EKG is not ordered today.    Recent Labs: 10/03/2022: Hemoglobin 13.2; Platelets 413 12/26/2022: ALT 26; BUN 15; Creatinine, Ser 0.73; Potassium 3.9; Sodium 138  Recent Lipid Panel    Component Value Date/Time   CHOL 150 09/24/2022 1517   TRIG 51.0 09/24/2022 1517   HDL 61.10 09/24/2022 1517   CHOLHDL 2 09/24/2022 1517   VLDL 10.2 09/24/2022 1517   LDLCALC 79 09/24/2022 1517   LDLDIRECT 91.0 07/11/2015 1329   Physical Exam:    VS:  BP (!) 118/50   Pulse 82   Ht 6' (1.829 m)   SpO2 96%   BMI 25.36 kg/m     Wt Readings from Last 3 Encounters:  01/16/23 187 lb (84.8 kg)  12/31/22 187 lb (84.8 kg)  11/26/22 181 lb 14.4 oz (82.5 kg)    General: Well developed, well nourished, NAD Lungs:Clear to ausculation bilaterally. No wheezes, rales, or rhonchi. Breathing is unlabored. Cardiovascular: RRR with S1 S2. No murmurs Neuro: Alert and oriented. No focal deficits. No facial asymmetry. MAE spontaneously. Psych: Responds to questions appropriately with normal affect.    ASSESSMENT/PLAN:    Hx of CVA with known PFO with atrial septal aneurysm: TEE 6/14 with Dr. Eden Emms showed large mobile atrial septal aneursym with large PFO at inferior/anterior margin with ling tunnel gap as large as 0.75 cm. He is now s/p PFO closure with 25mm Abbott Talacen occluder device without complication. Remains on coumadin given chronic DVT. Echo today with stable 25 mm Abbott Talacen Occluder device and no intracardiac shunting with negative bubble. Follow up PRN with cardiology.     HTN: Stable with no changes needed today.    Chronic DVT: Continue warfarin per primary team   DM2: Follows with endocrinology.   S/p multiple  amputations: Hx of R AKA and more recent L BKA followed by Dr. Lajoyce Corners.    Medication Adjustments/Labs and Tests Ordered: Current medicines are reviewed at length with the patient today.  Concerns regarding  medicines are outlined above.  No orders of the defined types were placed in this encounter.  No orders of the defined types were placed in this encounter.   Patient Instructions  Medication Instructions:  Your physician recommends that you continue on your current medications as directed. Please refer to the Current Medication list given to you today.  *If you need a refill on your cardiac medications before your next appointment, please call your pharmacy*   Lab Work: NONE If you have labs (blood work) drawn today and your tests are completely normal, you will receive your results only by: MyChart Message (if you have MyChart) OR A paper copy in the mail If you have any lab test that is abnormal or we need to change your treatment, we will call you to review the results.   Testing/Procedures: NONE   Follow-Up: At [facility], you and your health needs are our priority.  As part of our continuing mission to provide you with exceptional heart care, we have created designated Provider Care Teams.  These Care Teams include your primary Cardiologist (physician) and Advanced Practice Providers (APPs -  Physician Assistants and Nurse Practitioners) who all work together to provide you with the care you need, when you need it.  We recommend signing up for the patient portal called "MyChart".  Sign up information is provided on this After Visit Summary.  MyChart is used to connect with patients for Virtual Visits (Telemedicine).  Patients are able to view lab/test results, encounter notes, upcoming appointments, etc.  Non-urgent messages can be sent to your provider as well.   To learn more about what you can do with MyChart, go to [email or web address].    Your next  appointment:   FOLLOW-UP AS NEEDED    Signed, Georgie Chard, NP  03/05/2023 10:30 AM    [facility]

## 2023-03-01 ENCOUNTER — Encounter: Payer: Self-pay | Admitting: Orthopedic Surgery

## 2023-03-01 NOTE — Progress Notes (Signed)
Office Visit Note   Patient: Gregory May           Date of Birth: August 03, 1961           MRN: [id number] Visit Date: 02/21/2023              Requested by: Gwenevere Abbot, MD [address] [place],  [place] [address] PCP: Gwenevere Abbot, MD  Chief Complaint  Patient presents with   Left Leg - Follow-up      HPI: 5 months s/p BKA left, traumatic wound later left leg from rubbing on the wheelchair  Assessment & Plan: Visit Diagnoses:  1. Left below-knee amputee (HCC)   2. Below-knee amputation of right lower extremity, initial encounter [facility])     Plan: dressing change daily and compression  Follow-Up Instructions: Return in about 4 weeks (around 03/21/2023).   Ortho Exam  Patient is alert, oriented, no adenopathy, well-dressed, normal affect, normal respiratory effort. Improved dermatitis, lateral ulcer with improved granulation tissue  Imaging: No results found.    Labs: Lab Results  Component Value Date   HGBA1C 6.9 (H) 09/24/2022   HGBA1C 7.2 (H) 03/08/2022   HGBA1C 7.2 (H) 11/29/2021   ESRSEDRATE 11 11/15/2021   ESRSEDRATE 25 (H) 08/19/2021   ESRSEDRATE 8 07/15/2019   CRP 0.7 11/17/2021   CRP <0.5 11/15/2021   CRP 1.7 (H) 08/19/2021   REPTSTATUS 10/03/2022 FINAL 09/28/2022   REPTSTATUS 10/03/2022 FINAL 09/28/2022   GRAMSTAIN  08/21/2021    WBC PRESENT,BOTH PMN AND MONONUCLEAR NO ORGANISMS SEEN    CULT  09/28/2022    NO GROWTH 5 DAYS Performed at [facility], [address], [place], [place] [address]    CULT  09/28/2022    NO GROWTH 5 DAYS Performed at [facility], [address] [place], [place] [address]    [place] STAPHYLOCOCCUS HOMINIS 08/21/2021     Lab Results  Component Value Date   ALBUMIN 3.8 12/26/2022   ALBUMIN 2.8 (L) 09/28/2022   ALBUMIN 4.1 09/24/2022    Lab Results  Component Value Date   MG 1.9 11/15/2021   Lab Results  Component Value Date   VD25OH 16 (L) 11/05/2013    No results found for:  "PREALBUMIN"    Latest Ref Rng & Units 10/03/2022    2:13 AM 10/01/2022    5:02 AM 09/30/2022    4:06 AM  CBC EXTENDED  WBC 4.0 - 10.5 K/uL 12.8  12.6  14.0   RBC 4.22 - 5.81 MIL/uL 4.26  4.25  3.84   Hemoglobin 13.0 - 17.0 g/dL 69.6  29.5  28.4   HCT 39.0 - 52.0 % 39.1  39.4  36.3   Platelets 150 - 400 K/uL 413  369  325      There is no height or weight on file to calculate BMI.  Orders:  No orders of the defined types were placed in this encounter.  Meds ordered this encounter  Medications   HYDROcodone-acetaminophen (NORCO/VICODIN) 5-325 MG tablet    Sig: Take 1 tablet by mouth every 6 (six) hours as needed for moderate pain.    Dispense:  30 tablet    Refill:  0     Procedures: No procedures performed  Clinical Data: No additional findings.  ROS:  All other systems negative, except as noted in the HPI. Review of Systems  Objective: Vital Signs: There were no vitals taken for this visit.  Specialty Comments:  No specialty comments available.  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) 10/03/2022   Lower extremity cellulitis 09/29/2022   Acute osteomyelitis of left foot (HCC) 09/29/2022   PFO with atrial septal aneurysm    Long term (current) use of anticoagulants 02/26/2022   Right retinal embolus 01/15/2022   DM type 2 with diabetic peripheral neuropathy (HCC) 01/15/2022   Obstructive sleep apnea 01/15/2022   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   Transportation insecurity 11/23/2021   Retinal artery occlusion, central 11/15/2021   Epidermal inclusion cyst 10/18/2021   Amputated finger 09/05/2021   Osteomyelitis of finger of right hand (HCC) 08/19/2021   Hepatic steatosis 06/24/2020   Back pain 04/20/2020   Left knee pain 04/20/2020   Allergic sinusitis 04/20/2020   Elevated liver enzymes 04/20/2020   Cutaneous abscess of left foot    Diastasis of rectus  abdominis 07/16/2019   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 10/07/2018   Essential hypertension 06/19/2018   Peripheral neuropathy 12/19/2017   Left below-knee amputee (HCC) 04/17/2017   Carpal tunnel syndrome 03/18/2017   Healthcare maintenance 06/21/2015   Status post below knee amputation of right lower extremity (HCC) 06/07/2015   Diabetes mellitus with carpal tunnel syndrome (HCC) 12/09/2014   DJD (degenerative joint disease) of knee 07/21/2014   Osteoarthritis, hip, bilateral 05/25/2014   Onychomycosis 10/14/2013   Pure hypercholesterolemia 09/25/2013   Type 2 diabetes with complication (HCC) 09/15/1997   Past Medical History:  Diagnosis Date   Arthritis    bilateral hips   Cutaneous abscess of left foot    Deep vein thrombosis (DVT) (HCC)    Diabetes mellitus    type II   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 Date of diagnosis February 08 2014 Duration of anticoagulation: 6 months. End date 08/12/2014.  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 Duration of anticoagulation: 8 months. End date 10/11/2014.  Anticoagulant: Lovenox 120 units daily Switched to Eliquis on 05/25/2014 per patient preference    Pulmonary embolism (HCC)    Sebaceous cyst    on back of neck    Family History  Problem Relation Age of Onset   Breast cancer Mother    Cancer Mother        small intestine   Liver cancer Mother    Diabetes Mother    Diabetes Father    Diabetes Brother     Hypertension Maternal Grandmother    Heart Problems Maternal Grandmother    Diabetes Paternal Grandmother    Diabetes Paternal Grandfather    Diabetes Brother     Past Surgical History:  Procedure Laterality Date   AMPUTATION Right 06/03/2015   Procedure: Right Below Knee Amputation;  Surgeon: Nadara Mustard, MD;  Location: [facility] OR;  Service: Orthopedics;  Laterality: Right;   AMPUTATION Left 07/24/2019   Procedure: LEFT FOOT 3RD RAY AMPUTATION;  Surgeon: Nadara Mustard, MD;  Location: [facility] OR;  Service: Orthopedics;  Laterality: Left;   AMPUTATION Left 06/23/2021   Procedure: LEFT 2ND TOE AMPUTATION;  Surgeon: Nadara Mustard, MD;  Location: [facility] OR;  Service: Orthopedics;  Laterality: Left;   AMPUTATION Right 08/21/2021   Procedure: AMPUTATION RIGHT INDEX FINGER;  Surgeon: Gomez Cleverly, MD;  Location: [facility] OR;  Service: Orthopedics;  Laterality: Right;   AMPUTATION Left 09/29/2022   Procedure: AMPUTATION BELOW KNEE;  Surgeon: Nadara Mustard, MD;  Location: [facility] OR;  Service: Orthopedics;  Laterality: Left;   BUBBLE STUDY  01/24/2022   Procedure: BUBBLE STUDY;  Surgeon: Wendall Stade, MD;  Location: [facility] ENDOSCOPY;  Service: Cardiovascular;;   CLOSED REDUCTION WITH HUMER PIN INSERTION  1974   left hip   HARDWARE REMOVAL Left 07/21/2014   Procedure: HARDWARE REMOVAL;  Surgeon: Loreta Ave, MD;  Location: [facility] OR;  Service: Orthopedics;  Laterality: Left;   I & D EXTREMITY Right 08/21/2021   Procedure: IRRIGATION AND DEBRIDEMENT RIGHT INDEX FINGER;  Surgeon: Gomez Cleverly, MD;  Location: [facility] OR;  Service: Orthopedics;  Laterality: Right;   PATENT FORAMEN OVALE(PFO) CLOSURE N/A 02/28/2022   Procedure: PATENT FORAMEN OVALE(PFO) CLOSURE;  Surgeon: Tonny Bollman, MD;  Location: [facility] INVASIVE CV LAB;  Service: Cardiovascular;  Laterality: N/A;   ROTATOR CUFF REPAIR Right 2005 (approx)   TEE WITHOUT CARDIOVERSION N/A 01/24/2022   Procedure: TRANSESOPHAGEAL ECHOCARDIOGRAM (TEE);  Surgeon: Wendall Stade, MD;   Location: [facility] ENDOSCOPY;  Service: Cardiovascular;  Laterality: N/A;   TOTAL HIP ARTHROPLASTY Left 07/21/2014   Procedure: TOTAL HIP ARTHROPLASTY ANTERIOR APPROACH;  Surgeon: Loreta Ave, MD;  Location: [facility] OR;  Service: Orthopedics;  Laterality: Left;   TOTAL HIP ARTHROPLASTY Right 2006 (approx)   right hip replaced   Social History   Occupational History   Occupation: disabled  Tobacco Use   Smoking status: Never   Smokeless tobacco: Never  Vaping Use   Vaping status: Never Used  Substance and Sexual Activity   Alcohol use: Not Currently    Comment: beer and mixed drink maybe 5  times a month   Drug use: No   Sexual activity: Never

## 2023-03-04 ENCOUNTER — Ambulatory Visit (HOSPITAL_COMMUNITY): Payer: Medicare HMO | Attending: Cardiology | Admitting: Cardiology

## 2023-03-04 ENCOUNTER — Ambulatory Visit: Payer: Medicare HMO

## 2023-03-04 ENCOUNTER — Other Ambulatory Visit (HOSPITAL_COMMUNITY): Payer: Medicare HMO

## 2023-03-04 ENCOUNTER — Other Ambulatory Visit: Payer: Self-pay | Admitting: Endocrinology

## 2023-03-04 ENCOUNTER — Ambulatory Visit (INDEPENDENT_AMBULATORY_CARE_PROVIDER_SITE_OTHER): Payer: Medicare HMO | Admitting: Pharmacist

## 2023-03-04 ENCOUNTER — Ambulatory Visit (HOSPITAL_BASED_OUTPATIENT_CLINIC_OR_DEPARTMENT_OTHER): Payer: Medicare HMO

## 2023-03-04 VITALS — BP 118/50 | HR 82 | Ht 72.0 in

## 2023-03-04 DIAGNOSIS — H349 Unspecified retinal vascular occlusion: Secondary | ICD-10-CM

## 2023-03-04 DIAGNOSIS — I253 Aneurysm of heart: Secondary | ICD-10-CM | POA: Insufficient documentation

## 2023-03-04 DIAGNOSIS — Z8673 Personal history of transient ischemic attack (TIA), and cerebral infarction without residual deficits: Secondary | ICD-10-CM | POA: Diagnosis not present

## 2023-03-04 DIAGNOSIS — H3411 Central retinal artery occlusion, right eye: Secondary | ICD-10-CM

## 2023-03-04 DIAGNOSIS — Z7901 Long term (current) use of anticoagulants: Secondary | ICD-10-CM

## 2023-03-04 DIAGNOSIS — E118 Type 2 diabetes mellitus with unspecified complications: Secondary | ICD-10-CM | POA: Diagnosis not present

## 2023-03-04 DIAGNOSIS — Q2112 Patent foramen ovale: Secondary | ICD-10-CM

## 2023-03-04 DIAGNOSIS — Z794 Long term (current) use of insulin: Secondary | ICD-10-CM | POA: Diagnosis not present

## 2023-03-04 DIAGNOSIS — I10 Essential (primary) hypertension: Secondary | ICD-10-CM | POA: Diagnosis not present

## 2023-03-04 DIAGNOSIS — I825Y9 Chronic embolism and thrombosis of unspecified deep veins of unspecified proximal lower extremity: Secondary | ICD-10-CM | POA: Diagnosis not present

## 2023-03-04 LAB — ECHOCARDIOGRAM LIMITED BUBBLE STUDY

## 2023-03-04 LAB — POCT INR: INR: 2.5 (ref 2.0–3.0)

## 2023-03-04 NOTE — Progress Notes (Signed)
Anticoagulation Management Gregory May is a 62 y.o. male who reports to the clinic for monitoring of warfarin treatment.    Indication:  Retinal artery occlusion, central; Right retinal embolus; Long term current use of oral anticoagulation with Coumadin with target INR 2.0 - 3.0.  Duration: indefinite Supervising physician:  Mercie Eon, MD  Anticoagulation Clinic Visit History: Patient does not report signs/symptoms of bleeding or thromboembolism  Other recent changes: No diet, medications, lifestyle changes cited by the patient at this visit.  Anticoagulation Episode Summary     Current INR goal:  2.0-3.0  TTR:  61.9% (10.3 mo)  Next INR check:  03/25/2023  INR from last check:  2.5 (03/04/2023)  Weekly max warfarin dose:  --  Target end date:  --  INR check location:  --  Preferred lab:  --  Send INR reminders to:  --   Indications   Pulmonary emboli (HCC) (Resolved) [I26.99] DVT (deep venous thrombosis) (HCC) (Resolved) [I82.409] Retinal artery occlusion central [H34.10] Right retinal embolus [H34.9] Long term (current) use of anticoagulants [Z79.01]        Comments:  --         No Known Allergies  Current Outpatient Medications:    Accu-Chek FastClix Lancets MISC, Use Accu Chek Fastclix lancets to check blood sugar three times daily. DX:E11.65, Disp: 300 each, Rfl: 2   ACCU-CHEK GUIDE test strip, TEST BLOOD SUGAR THREE TIMES DAILY AS DIRECTED, Disp: 300 strip, Rfl: 3   acetaminophen (TYLENOL) 500 MG tablet, Take 1,000 mg by mouth as needed for mild pain or moderate pain., Disp: , Rfl:    APPLE CIDER VINEGAR PO, Take 1 tablet by mouth daily., Disp: , Rfl:    ascorbic acid (VITAMIN C) 1000 MG tablet, Take 1 tablet (1,000 mg total) by mouth daily., Disp: , Rfl:    empagliflozin (JARDIANCE) 25 MG TABS tablet, Take 1 tablet (25 mg total) by mouth daily., Disp: 90 tablet, Rfl: 3   fluticasone (FLONASE) 50 MCG/ACT nasal spray, USE 2 SPRAY(S) IN EACH NOSTRIL AT  BEDTIME AS NEEDED FOR  SINUS  DRAINAGE, Disp: 16 g, Rfl: 0   gabapentin (NEURONTIN) 300 MG capsule, TAKE 1 CAPSULE FOUR TIMES DAILY AS NEEDED, Disp: 360 capsule, Rfl: 0   HYDROcodone-acetaminophen (NORCO/VICODIN) 5-325 MG tablet, Take 1 tablet by mouth every 6 (six) hours as needed for moderate pain., Disp: 30 tablet, Rfl: 0   insulin aspart (NOVOLOG) 100 UNIT/ML injection, Inject 0-9 Units into the skin 3 (three) times daily with meals. Correction coverage: Sensitive (thin, NPO, renal)  CBG < 70: Implement Hypoglycemia Standing Orders and refer to Hypoglycemia Standing Orders sidebar report  CBG 70 - 120: 0 units  CBG 121 - 150: 1 unit  CBG 151 - 200: 2 units  CBG 201 - 250: 3 units  CBG 251 - 300: 5 units  CBG 301 - 350: 7 units  CBG 351 - 400 9 units  CBG > 400  10 units, Disp: 10 mL, Rfl: 11   Insulin Syringes, Disposable, U-100 0.5 ML MISC, Use to inject insulin, Disp: 100 each, Rfl: 2   metFORMIN (GLUCOPHAGE-XR) 750 MG 24 hr tablet, TAKE 2 TABLETS EVERY DAY, Disp: 180 tablet, Rfl: 3   Multiple Vitamin (MULTIVITAMIN WITH MINERALS) TABS tablet, Take 1 tablet by mouth every morning. Centrum, Disp: , Rfl:    pioglitazone (ACTOS) 30 MG tablet, TAKE 1 TABLET EVERY DAY, Disp: 90 tablet, Rfl: 3   prednisoLONE acetate (PRED FORTE) 1 % ophthalmic suspension, Place  1 drop into the right eye every evening., Disp: , Rfl:    rosuvastatin (CRESTOR) 20 MG tablet, TAKE 1 TABLET EVERY DAY, Disp: 90 tablet, Rfl: 3   warfarin (COUMADIN) 5 MG tablet, Take 2 (two) of your 5 mg peach-colored warfarin tablets on Mondays, Tuesdays,Wednesdays and Fridays. All OTHER DAYS, (Sundays, Thursdays and Saturdays) take two and one-half ( 2 & 1/2 tablets.), Disp: 64 tablet, Rfl: 3   zinc sulfate 220 (50 Zn) MG capsule, Take 1 capsule (220 mg total) by mouth daily., Disp: , Rfl:  Past Medical History:  Diagnosis Date   Arthritis    bilateral hips   Cutaneous abscess of left foot    Deep vein thrombosis (DVT) (HCC)    Diabetes  mellitus    type II   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 Date of diagnosis February 08 2014 Duration of anticoagulation: 6 months. End date 08/12/2014.  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 Duration of anticoagulation: 8 months. End date 10/11/2014.  Anticoagulant: Lovenox 120 units daily Switched to Eliquis on 05/25/2014 per patient preference    Pulmonary embolism (HCC)    Sebaceous cyst    on back of neck   Social History   Socioeconomic History   Marital status: Single    Spouse name: Not on file   Number of children: 0   Years of education: Not on file   Highest education level: Not on file  Occupational History   Occupation: disabled  Tobacco Use   Smoking status: Never   Smokeless tobacco: Never  Vaping Use   Vaping status: Never Used  Substance and Sexual Activity   Alcohol use: Not Currently    Comment: beer and mixed drink maybe 5  times a month   Drug use: No   Sexual activity: Never  Other Topics Concern   Not on file  Social History Narrative   Not on file   Social Determinants of Health   Financial Resource Strain: Low Risk  (01/16/2023)   Overall Financial Resource Strain (CARDIA)    Difficulty of Paying Living Expenses: Not hard at all  Food Insecurity: No Food Insecurity (01/16/2023)   Hunger Vital Sign    Worried About Running Out of Food in the Last Year: Never true    Ran Out of Food in the Last Year: Never true  Transportation Needs: No Transportation Needs (01/16/2023)   PRAPARE -  Administrator, Civil Service (Medical): No    Lack of Transportation (Non-Medical): No  Physical Activity: Inactive (01/16/2023)   Exercise Vital Sign    Days of Exercise per Week: 0 days    Minutes of Exercise per Session: 0 min  Stress: No Stress Concern Present (01/16/2023)   [employer] of Occupational Health - Occupational Stress Questionnaire    Feeling of Stress : Only a little  Social Connections: Socially Isolated (01/16/2023)   Social Connection and Isolation Panel [NHANES]    Frequency of Communication with  Friends and Family: More than three times a week    Frequency of Social Gatherings with Friends and Family: Twice a week    Attends Religious Services: Never    Database administrator or Organizations: No    Attends Engineer, structural: Never    Marital Status: Never married   Family History  Problem Relation Age of Onset   Breast cancer Mother    Cancer Mother        small intestine   Liver cancer Mother    Diabetes Mother    Diabetes Father    Diabetes Brother    Hypertension Maternal Grandmother    Heart Problems Maternal Grandmother    Diabetes Paternal Grandmother    Diabetes Paternal Grandfather    Diabetes Brother     ASSESSMENT Recent Results: The most recent result is correlated with 72.5 mg per week: Lab Results  Component Value Date   INR 2.5 03/04/2023   INR 3.0 02/11/2023   INR 3.0 01/21/2023    Anticoagulation Dosing: Description   Take 2 of your 5mg  strength peach-colored warfarin tablets by mouth, once-daily at 4:00pm, Monday through Saturday. On Sundays, take 2 and 1/2 of your tablets.      INR today: Therapeutic  PLAN Weekly dose was unchanged. Continue taking 72.5mg  warfarin per week as 2 of your 5mg  strength peach-colored warfarin tablets Monday -Saturday and 2& 1/2 tabets on Sundays.   Patient Instructions  Patient instructed to take medications as defined in the Anti-coagulation Track section of this  encounter.  Patient instructed to take today's dose.  Patient instructed to take 2 of your 5mg  strength peach-colored warfarin tablets by mouth, once-daily at 4:00pm, Monday through Saturday. On Sundays, take 2 and 1/2 of your tablets. Patient verbalized understanding of these instructions.  Patient advised to contact clinic or seek medical attention if signs/symptoms of bleeding or thromboembolism occur.  Patient verbalized understanding by repeating back information and was advised to contact me if further medication-related questions arise. Patient was also provided an information handout.  Follow-up Return in 3 weeks (on 03/25/2023) for Follow up INR.  Elicia Lamp, PharmD, CPP  15 minutes spent face-to-face with the patient during the encounter. 50% of time spent on education, including signs/sx bleeding and clotting, as well as food and drug interactions with warfarin. 50% of time was spent on fingerprick POC INR sample collection,processing, results determination, and documentation in [email or web address].

## 2023-03-04 NOTE — Patient Instructions (Signed)
Patient instructed to take medications as defined in the Anti-coagulation Track section of this encounter.  Patient instructed to take today's dose.  Patient instructed to take 2 of your 5mg  strength peach-colored warfarin tablets by mouth, once-daily at 4:00pm, Monday through Saturday. On Sundays, take 2 and 1/2 of your tablets.  Patient verbalized understanding of these instructions.

## 2023-03-04 NOTE — Patient Instructions (Signed)
Medication Instructions:  Your physician recommends that you continue on your current medications as directed. Please refer to the Current Medication list given to you today.  *If you need a refill on your cardiac medications before your next appointment, please call your pharmacy*   Lab Work: NONE If you have labs (blood work) drawn today and your tests are completely normal, you will receive your results only by: MyChart Message (if you have MyChart) OR A paper copy in the mail If you have any lab test that is abnormal or we need to change your treatment, we will call you to review the results.   Testing/Procedures: NONE   Follow-Up: At [facility], you and your health needs are our priority.  As part of our continuing mission to provide you with exceptional heart care, we have created designated Provider Care Teams.  These Care Teams include your primary Cardiologist (physician) and Advanced Practice Providers (APPs -  Physician Assistants and Nurse Practitioners) who all work together to provide you with the care you need, when you need it.  We recommend signing up for the patient portal called "MyChart".  Sign up information is provided on this After Visit Summary.  MyChart is used to connect with patients for Virtual Visits (Telemedicine).  Patients are able to view lab/test results, encounter notes, upcoming appointments, etc.  Non-urgent messages can be sent to your provider as well.   To learn more about what you can do with MyChart, go to [email or web address].    Your next appointment:   FOLLOW-UP AS NEEDED

## 2023-03-05 DIAGNOSIS — E1142 Type 2 diabetes mellitus with diabetic polyneuropathy: Secondary | ICD-10-CM | POA: Diagnosis not present

## 2023-03-05 DIAGNOSIS — K760 Fatty (change of) liver, not elsewhere classified: Secondary | ICD-10-CM | POA: Diagnosis not present

## 2023-03-05 DIAGNOSIS — E7800 Pure hypercholesterolemia, unspecified: Secondary | ICD-10-CM | POA: Diagnosis not present

## 2023-03-05 DIAGNOSIS — Z4781 Encounter for orthopedic aftercare following surgical amputation: Secondary | ICD-10-CM | POA: Diagnosis not present

## 2023-03-05 DIAGNOSIS — Z89512 Acquired absence of left leg below knee: Secondary | ICD-10-CM | POA: Diagnosis not present

## 2023-03-05 DIAGNOSIS — Z7901 Long term (current) use of anticoagulants: Secondary | ICD-10-CM | POA: Diagnosis not present

## 2023-03-05 DIAGNOSIS — H3411 Central retinal artery occlusion, right eye: Secondary | ICD-10-CM | POA: Diagnosis not present

## 2023-03-05 DIAGNOSIS — M160 Bilateral primary osteoarthritis of hip: Secondary | ICD-10-CM | POA: Diagnosis not present

## 2023-03-05 DIAGNOSIS — G4733 Obstructive sleep apnea (adult) (pediatric): Secondary | ICD-10-CM | POA: Diagnosis not present

## 2023-03-07 DIAGNOSIS — H3411 Central retinal artery occlusion, right eye: Secondary | ICD-10-CM | POA: Diagnosis not present

## 2023-03-07 DIAGNOSIS — E7800 Pure hypercholesterolemia, unspecified: Secondary | ICD-10-CM | POA: Diagnosis not present

## 2023-03-07 DIAGNOSIS — Z4781 Encounter for orthopedic aftercare following surgical amputation: Secondary | ICD-10-CM | POA: Diagnosis not present

## 2023-03-07 DIAGNOSIS — Z7901 Long term (current) use of anticoagulants: Secondary | ICD-10-CM | POA: Diagnosis not present

## 2023-03-07 DIAGNOSIS — E1142 Type 2 diabetes mellitus with diabetic polyneuropathy: Secondary | ICD-10-CM | POA: Diagnosis not present

## 2023-03-07 DIAGNOSIS — Z89512 Acquired absence of left leg below knee: Secondary | ICD-10-CM | POA: Diagnosis not present

## 2023-03-07 DIAGNOSIS — G4733 Obstructive sleep apnea (adult) (pediatric): Secondary | ICD-10-CM | POA: Diagnosis not present

## 2023-03-07 DIAGNOSIS — M160 Bilateral primary osteoarthritis of hip: Secondary | ICD-10-CM | POA: Diagnosis not present

## 2023-03-07 DIAGNOSIS — K760 Fatty (change of) liver, not elsewhere classified: Secondary | ICD-10-CM | POA: Diagnosis not present

## 2023-03-12 DIAGNOSIS — M160 Bilateral primary osteoarthritis of hip: Secondary | ICD-10-CM | POA: Diagnosis not present

## 2023-03-12 DIAGNOSIS — E1142 Type 2 diabetes mellitus with diabetic polyneuropathy: Secondary | ICD-10-CM | POA: Diagnosis not present

## 2023-03-12 DIAGNOSIS — G4733 Obstructive sleep apnea (adult) (pediatric): Secondary | ICD-10-CM | POA: Diagnosis not present

## 2023-03-12 DIAGNOSIS — Z89512 Acquired absence of left leg below knee: Secondary | ICD-10-CM | POA: Diagnosis not present

## 2023-03-12 DIAGNOSIS — E7800 Pure hypercholesterolemia, unspecified: Secondary | ICD-10-CM | POA: Diagnosis not present

## 2023-03-12 DIAGNOSIS — H3411 Central retinal artery occlusion, right eye: Secondary | ICD-10-CM | POA: Diagnosis not present

## 2023-03-12 DIAGNOSIS — Z7901 Long term (current) use of anticoagulants: Secondary | ICD-10-CM | POA: Diagnosis not present

## 2023-03-12 DIAGNOSIS — Z4781 Encounter for orthopedic aftercare following surgical amputation: Secondary | ICD-10-CM | POA: Diagnosis not present

## 2023-03-12 DIAGNOSIS — K760 Fatty (change of) liver, not elsewhere classified: Secondary | ICD-10-CM | POA: Diagnosis not present

## 2023-03-14 ENCOUNTER — Ambulatory Visit: Payer: Medicare HMO | Admitting: Orthopedic Surgery

## 2023-03-14 DIAGNOSIS — Z89512 Acquired absence of left leg below knee: Secondary | ICD-10-CM

## 2023-03-14 MED ORDER — HYDROCODONE-ACETAMINOPHEN 5-325 MG PO TABS: 1.0000 | ORAL_TABLET | Freq: Four times a day (QID) | ORAL | 0 refills | Status: DC | PRN

## 2023-03-15 DIAGNOSIS — E7800 Pure hypercholesterolemia, unspecified: Secondary | ICD-10-CM | POA: Diagnosis not present

## 2023-03-15 DIAGNOSIS — M160 Bilateral primary osteoarthritis of hip: Secondary | ICD-10-CM | POA: Diagnosis not present

## 2023-03-15 DIAGNOSIS — H3411 Central retinal artery occlusion, right eye: Secondary | ICD-10-CM | POA: Diagnosis not present

## 2023-03-15 DIAGNOSIS — E1142 Type 2 diabetes mellitus with diabetic polyneuropathy: Secondary | ICD-10-CM | POA: Diagnosis not present

## 2023-03-15 DIAGNOSIS — Z7901 Long term (current) use of anticoagulants: Secondary | ICD-10-CM | POA: Diagnosis not present

## 2023-03-15 DIAGNOSIS — Z4781 Encounter for orthopedic aftercare following surgical amputation: Secondary | ICD-10-CM | POA: Diagnosis not present

## 2023-03-15 DIAGNOSIS — G4733 Obstructive sleep apnea (adult) (pediatric): Secondary | ICD-10-CM | POA: Diagnosis not present

## 2023-03-15 DIAGNOSIS — Z89512 Acquired absence of left leg below knee: Secondary | ICD-10-CM | POA: Diagnosis not present

## 2023-03-15 DIAGNOSIS — K760 Fatty (change of) liver, not elsewhere classified: Secondary | ICD-10-CM | POA: Diagnosis not present

## 2023-03-17 ENCOUNTER — Encounter: Payer: Self-pay | Admitting: Orthopedic Surgery

## 2023-03-17 NOTE — Progress Notes (Signed)
Office Visit Note   Patient: Gregory May           Date of Birth: November 17, 1960           MRN: [id number] Visit Date: 02/28/2023              Requested by: Gwenevere Abbot, MD [address] 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  Patient has abrasions to the residual limb medially and laterally.  Assessment & Plan: Visit Diagnoses:  1. Left below-knee amputee [facility])     Plan: Continue with wound care recommended protein supplements.  Follow-Up Instructions: Return in about 2 weeks (around 03/14/2023).   Ortho Exam  Patient is alert, oriented, no adenopathy, well-dressed, normal affect, normal respiratory effort. Examination patient has a large abrasion ulcer over the lateral aspect of the left below-knee amputation is 5 x 7 cm.  These wounds are superficial no exposed bone or tendon no tunneling.  There is some abrasions medially as well.  No tenderness to palpation.  Imaging: No results found. No images are attached to the encounter.  Labs: Lab Results  Component Value Date   HGBA1C 6.9 (H) 09/24/2022   HGBA1C 7.2 (H) 03/08/2022   HGBA1C 7.2 (H) 11/29/2021   ESRSEDRATE 11 11/15/2021   ESRSEDRATE 25 (H) 08/19/2021   ESRSEDRATE 8 07/15/2019   CRP 0.7 11/17/2021   CRP <0.5 11/15/2021   CRP 1.7 (H) 08/19/2021   REPTSTATUS 10/03/2022 FINAL 09/28/2022   REPTSTATUS 10/03/2022 FINAL 09/28/2022   GRAMSTAIN  08/21/2021    WBC PRESENT,BOTH PMN AND MONONUCLEAR NO ORGANISMS SEEN    CULT  09/28/2022    NO GROWTH 5 DAYS Performed at [facility], [address], [place], [place] [address]    CULT  09/28/2022    NO GROWTH 5 DAYS Performed at [facility], [address] [place], [place] [address]    [facility] STAPHYLOCOCCUS HOMINIS 08/21/2021     Lab Results  Component  Value Date   ALBUMIN 3.8 12/26/2022   ALBUMIN 2.8 (L) 09/28/2022   ALBUMIN 4.1 09/24/2022    Lab Results  Component Value Date   MG 1.9 11/15/2021   Lab Results  Component Value Date   VD25OH 16 (L) 11/05/2013    No results found for: "PREALBUMIN"    Latest Ref Rng & Units 10/03/2022    2:13 AM 10/01/2022    5:02 AM 09/30/2022    4:06 AM  CBC EXTENDED  WBC 4.0 - 10.5 K/uL 12.8  12.6  14.0   RBC 4.22 - 5.81 MIL/uL 4.26  4.25  3.84   Hemoglobin 13.0 - 17.0 g/dL 32.4  40.1  02.7   HCT 39.0 - 52.0 % 39.1  39.4  36.3   Platelets 150 - 400 K/uL 413  369  325      There is no height or weight on file to calculate BMI.  Orders:  No orders of the defined types were placed in this encounter.  No orders of the defined types were placed in this encounter.    Procedures: No procedures performed  Clinical Data: No additional findings.  ROS:  All other systems negative, except as noted in the HPI. Review of Systems  Objective: Vital Signs: There were no vitals taken for  this visit.  Specialty Comments:  No specialty comments available.  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) 10/03/2022   Lower extremity cellulitis 09/29/2022   Acute osteomyelitis of left foot (HCC) 09/29/2022   PFO with atrial septal aneurysm    Long term (current) use of anticoagulants 02/26/2022   Right retinal embolus 01/15/2022   DM type 2 with diabetic peripheral neuropathy (HCC) 01/15/2022   Obstructive sleep apnea 01/15/2022   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   Transportation insecurity 11/23/2021   Retinal artery occlusion, central 11/15/2021   Epidermal inclusion cyst 10/18/2021   Amputated finger 09/05/2021   Osteomyelitis of finger of right hand (HCC) 08/19/2021   Hepatic steatosis 06/24/2020   Back pain 04/20/2020   Left knee pain 04/20/2020   Allergic sinusitis 04/20/2020    Elevated liver enzymes 04/20/2020   Cutaneous abscess of left foot    Diastasis of rectus abdominis 07/16/2019   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 10/07/2018   Essential hypertension 06/19/2018   Peripheral neuropathy 12/19/2017   Left below-knee amputee (HCC) 04/17/2017   Carpal tunnel syndrome 03/18/2017   Healthcare maintenance 06/21/2015   Status post below knee amputation of right lower extremity (HCC) 06/07/2015   Diabetes mellitus with carpal tunnel syndrome (HCC) 12/09/2014   DJD (degenerative joint disease) of knee 07/21/2014   Osteoarthritis, hip, bilateral 05/25/2014   Onychomycosis 10/14/2013   Pure hypercholesterolemia 09/25/2013   Type 2 diabetes with complication (HCC) 09/15/1997   Past Medical History:  Diagnosis Date   Arthritis    bilateral hips   Cutaneous abscess of left foot    Deep vein thrombosis (DVT) (HCC)    Diabetes mellitus    type II   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 Date of diagnosis February 08 2014 Duration of anticoagulation: 6 months. End date 08/12/2014.  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 Duration of anticoagulation: 8 months. End date 10/11/2014.  Anticoagulant: Lovenox 120 units daily Switched to Eliquis on 05/25/2014 per patient preference    Pulmonary embolism (HCC)    Sebaceous cyst    on back of neck    Family History  Problem Relation Age of Onset   Breast cancer Mother    Cancer Mother        small intestine    Liver cancer Mother    Diabetes Mother    Diabetes Father    Diabetes Brother    Hypertension Maternal Grandmother    Heart Problems Maternal Grandmother    Diabetes Paternal Grandmother    Diabetes Paternal Grandfather    Diabetes Brother     Past Surgical History:  Procedure Laterality Date   AMPUTATION Right 06/03/2015   Procedure: Right Below Knee Amputation;  Surgeon: Nadara Mustard, MD;  Location: [facility] OR;  Service: Orthopedics;  Laterality: Right;   AMPUTATION Left 07/24/2019   Procedure: LEFT  FOOT 3RD RAY AMPUTATION;  Surgeon: Nadara Mustard, MD;  Location: [facility] OR;  Service: Orthopedics;  Laterality: Left;   AMPUTATION Left 06/23/2021   Procedure: LEFT 2ND TOE AMPUTATION;  Surgeon: Nadara Mustard, MD;  Location: [facility] OR;  Service: Orthopedics;  Laterality: Left;   AMPUTATION Right 08/21/2021   Procedure: AMPUTATION RIGHT INDEX FINGER;  Surgeon: Gomez Cleverly, MD;  Location: [facility] OR;  Service: Orthopedics;  Laterality: Right;   AMPUTATION Left 09/29/2022   Procedure: AMPUTATION BELOW KNEE;  Surgeon: Nadara Mustard, MD;  Location: [facility] OR;  Service: Orthopedics;  Laterality: Left;   BUBBLE STUDY  01/24/2022   Procedure: BUBBLE STUDY;  Surgeon: Wendall Stade, MD;  Location: [facility] ENDOSCOPY;  Service: Cardiovascular;;   CLOSED REDUCTION WITH HUMER PIN INSERTION  1974   left hip   HARDWARE REMOVAL Left 07/21/2014   Procedure: HARDWARE REMOVAL;  Surgeon: Loreta Ave, MD;  Location: [facility] OR;  Service: Orthopedics;  Laterality: Left;   I & D EXTREMITY Right 08/21/2021   Procedure: IRRIGATION AND DEBRIDEMENT RIGHT INDEX FINGER;  Surgeon: Gomez Cleverly, MD;  Location: [facility] OR;  Service: Orthopedics;  Laterality: Right;   PATENT FORAMEN OVALE(PFO) CLOSURE N/A 02/28/2022   Procedure: PATENT FORAMEN OVALE(PFO) CLOSURE;  Surgeon: Tonny Bollman, MD;  Location: [facility] INVASIVE CV LAB;  Service: Cardiovascular;  Laterality: N/A;   ROTATOR CUFF REPAIR Right 2005 (approx)   TEE WITHOUT CARDIOVERSION N/A  01/24/2022   Procedure: TRANSESOPHAGEAL ECHOCARDIOGRAM (TEE);  Surgeon: Wendall Stade, MD;  Location: [facility] ENDOSCOPY;  Service: Cardiovascular;  Laterality: N/A;   TOTAL HIP ARTHROPLASTY Left 07/21/2014   Procedure: TOTAL HIP ARTHROPLASTY ANTERIOR APPROACH;  Surgeon: Loreta Ave, MD;  Location: [facility] OR;  Service: Orthopedics;  Laterality: Left;   TOTAL HIP ARTHROPLASTY Right 2006 (approx)   right hip replaced   Social History   Occupational History   Occupation: disabled  Tobacco Use   Smoking status: Never   Smokeless tobacco: Never  Vaping Use   Vaping status: Never Used  Substance and Sexual Activity   Alcohol use: Not Currently    Comment: beer and mixed drink maybe 5  times a month   Drug use: No   Sexual activity: Never

## 2023-03-18 ENCOUNTER — Ambulatory Visit (INDEPENDENT_AMBULATORY_CARE_PROVIDER_SITE_OTHER): Payer: Medicare HMO | Admitting: Endocrinology

## 2023-03-18 ENCOUNTER — Encounter: Payer: Self-pay | Admitting: Endocrinology

## 2023-03-18 VITALS — BP 125/85 | HR 106 | Ht 72.0 in | Wt 187.8 lb

## 2023-03-18 DIAGNOSIS — E782 Mixed hyperlipidemia: Secondary | ICD-10-CM

## 2023-03-18 DIAGNOSIS — E119 Type 2 diabetes mellitus without complications: Secondary | ICD-10-CM

## 2023-03-18 DIAGNOSIS — Z794 Long term (current) use of insulin: Secondary | ICD-10-CM

## 2023-03-18 LAB — POCT GLYCOSYLATED HEMOGLOBIN (HGB A1C): Hemoglobin A1C: 6.6 % — AB (ref 4.0–5.6)

## 2023-03-18 NOTE — Progress Notes (Signed)
+Gregory May 62 y.o.           Reason for Appointment: follow-up   History of Present Illness   Diagnosis: Type 2 DIABETES MELITUS, date of diagnosis:  1999     Previous history: He has previously been treated with metformin and Victoza and subsequently mealtime insulin added to control postprandial hyperglycemia Overall he has been  relatively noncompliant with his diet, medications, monitoring and followup Previously would not take Victoza regularly because of cost. Has not been taking any medications for the last year and a half because of commitments to family and cost A1c had increased to 12.3% and hyperglycemia discovered when he was hospitalized for foot ulcer. Also lost 20 pounds because of hyperglycemia   For better control, compliance and inconvenience he was switched to the V-go pump on 02/21/16  Recent history:   Insulin regimen:   CURRENTLY: TRESIBA not taking, previously on 10 units hs  and NovoLog 10 units before dinner  Non-insulin hypoglycemic drugs: Metformin ER 1500 mg daily , Actos 30 mg every day, Jardiance 25mg  daily    A1c  is 6.6 compared to 6.9, fructosamine previously 218   Current diabetes management, blood sugar patterns and problems identified.   Again he did not bring his monitor for download  Although he thinks his fasting readings are fairly good he appears to have consistently high readings after meals, mostly 180-190 by recall Has not restarted [name] since his fasting readings are good Hypoglycemia not present He is not taking any insulin coverage for breakfast and blood sugars are high as above He does get some protein at breakfast He thinks his blood sugars are higher after dinner because of eating more carbohydrates but has not increased his doses despite discussion about blood sugar targets previously Taking Jardiance consistently and renal function is normal in 5/24 Still has not obtained the freestyle libre sensor even though  he was reminded to call his DME supplier several times, he has been contacted by them already   Has breakfast around 10-11 AM and dinner at 8 PM   Side effects from medications: None       Glucometer:  Accu-Chek guide          Blood Glucose readings by review of monitor:   PRE-MEAL Fasting Lunch Dinner Bedtime Overall  Glucose range: 67-88  95-137    Mean/median:     94   POST-MEAL PC Breakfast PC Lunch PC Dinner  Glucose range:   130  Mean/median:      Previously  PRE-MEAL Fasting Lunch Dinner Bedtime Overall  Glucose range: 120-155   160-180   Mean/median:        POST-MEAL PC Breakfast PC Lunch PC Dinner  Glucose range: 134    Mean/median:       Meals:  usually 2 meals per day usually.  breakfast at 12 noon, evening meal 7-9 pm, variable snacks late at night    Physical activity: exercise: Minimal               Weight control:   Wt Readings from Last 3 Encounters:  03/18/23 187 lb 12.8 oz (85.2 kg)  01/16/23 187 lb (84.8 kg)  12/31/22 187 lb (84.8 kg)          Diabetes labs:  Lab Results  Component Value Date   HGBA1C 6.6 (A) 03/18/2023   HGBA1C 6.9 (H) 09/24/2022   HGBA1C 7.2 (H) 03/08/2022   Lab Results  Component Value Date  MICROALBUR 0.9 09/24/2022   LDLCALC 79 09/24/2022   CREATININE 0.73 12/26/2022    Lab Results  Component Value Date   FRUCTOSAMINE 218 12/26/2022   FRUCTOSAMINE 245 09/19/2020   FRUCTOSAMINE 272 03/03/2019     Other active problems: See review of systems    Office Visit on 03/18/2023  Component Date Value Ref Range Status   Hemoglobin A1C 03/18/2023 6.6 (A)  4.0 - 5.6 % Final     Allergies as of 03/18/2023   No Known Allergies      Medication List        Accurate as of March 18, 2023  3:40 PM. If you have any questions, ask your nurse or doctor.          Accu-Chek FastClix Lancets Misc Use Accu Chek Fastclix lancets to check blood sugar three times daily. DX:E11.65   Accu-Chek Guide test  strip Generic drug: glucose blood TEST BLOOD SUGAR THREE TIMES DAILY AS DIRECTED   acetaminophen 500 MG tablet Commonly known as: TYLENOL Take 1,000 mg by mouth as needed for mild pain or moderate pain.   APPLE CIDER VINEGAR PO Take 1 tablet by mouth daily.   ascorbic acid 1000 MG tablet Commonly known as: VITAMIN C Take 1 tablet (1,000 mg total) by mouth daily.   empagliflozin 25 MG Tabs tablet Commonly known as: Jardiance Take 1 tablet (25 mg total) by mouth daily.   fluticasone 50 MCG/ACT nasal spray Commonly known as: FLONASE USE 2 SPRAY(S) IN EACH NOSTRIL AT BEDTIME AS NEEDED FOR  SINUS  DRAINAGE   gabapentin 300 MG capsule Commonly known as: NEURONTIN TAKE 1 CAPSULE FOUR TIMES DAILY AS NEEDED   HYDROcodone-acetaminophen 5-325 MG tablet Commonly known as: NORCO/VICODIN Take 1 tablet by mouth every 6 (six) hours as needed for moderate pain.   insulin aspart 100 UNIT/ML injection Commonly known as: novoLOG Inject 0-9 Units into the skin 3 (three) times daily with meals. Correction coverage: Sensitive (thin, NPO, renal)  CBG < 70: Implement Hypoglycemia Standing Orders and refer to Hypoglycemia Standing Orders sidebar report  CBG 70 - 120: 0 units  CBG 121 - 150: 1 unit  CBG 151 - 200: 2 units  CBG 201 - 250: 3 units  CBG 251 - 300: 5 units  CBG 301 - 350: 7 units  CBG 351 - 400 9 units  CBG > 400  10 units What changed: when to take this   Insulin Syringes (Disposable) U-100 0.5 ML Misc Use to inject insulin   metFORMIN 750 MG 24 hr tablet Commonly known as: GLUCOPHAGE-XR TAKE 2 TABLETS EVERY DAY   multivitamin with minerals Tabs tablet Take 1 tablet by mouth every morning. Centrum   pioglitazone 30 MG tablet Commonly known as: ACTOS TAKE 1 TABLET EVERY DAY   prednisoLONE acetate 1 % ophthalmic suspension Commonly known as: PRED FORTE Place 1 drop into the right eye every evening.   rosuvastatin 20 MG tablet Commonly known as: CRESTOR TAKE 1 TABLET  EVERY DAY   warfarin 5 MG tablet Commonly known as: COUMADIN Take as directed by the anticoagulation clinic. If you are unsure how to take this medication, talk to your nurse or doctor. Original instructions: Take 2 (two) of your 5 mg peach-colored warfarin tablets on Mondays, Tuesdays,Wednesdays and Fridays. All OTHER DAYS, (Sundays, Thursdays and Saturdays) take two and one-half ( 2 & 1/2 tablets.)   zinc sulfate 220 (50 Zn) MG capsule Take 1 capsule (220 mg total) by mouth daily.  Allergies: No Known Allergies  Past Medical History:  Diagnosis Date   Arthritis    bilateral hips   Cutaneous abscess of left foot    Deep vein thrombosis (DVT) (HCC)    Diabetes mellitus    type II   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 Date of diagnosis February 08 2014 Duration of anticoagulation: 6 months. End date 08/12/2014.  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 Duration of anticoagulation: 8 months. End date 10/11/2014.  Anticoagulant: Lovenox 120 units daily Switched to Eliquis on 05/25/2014 per patient preference    Pulmonary embolism (HCC)    Sebaceous cyst    on back of neck    Past Surgical History:  Procedure Laterality Date   AMPUTATION Right 06/03/2015   Procedure: Right Below Knee Amputation;  Surgeon: Nadara Mustard, MD;  Location: [facility] OR;  Service: Orthopedics;  Laterality: Right;   AMPUTATION Left 07/24/2019   Procedure: LEFT FOOT 3RD RAY AMPUTATION;  Surgeon: Nadara Mustard, MD;  Location: [facility] OR;   Service: Orthopedics;  Laterality: Left;   AMPUTATION Left 06/23/2021   Procedure: LEFT 2ND TOE AMPUTATION;  Surgeon: Nadara Mustard, MD;  Location: [facility] OR;  Service: Orthopedics;  Laterality: Left;   AMPUTATION Right 08/21/2021   Procedure: AMPUTATION RIGHT INDEX FINGER;  Surgeon: Gomez Cleverly, MD;  Location: [facility] OR;  Service: Orthopedics;  Laterality: Right;   AMPUTATION Left 09/29/2022   Procedure: AMPUTATION BELOW KNEE;  Surgeon: Nadara Mustard, MD;  Location: [facility] OR;  Service: Orthopedics;  Laterality: Left;   BUBBLE STUDY  01/24/2022   Procedure: BUBBLE STUDY;  Surgeon: Wendall Stade, MD;  Location: [facility] ENDOSCOPY;  Service: Cardiovascular;;   CLOSED REDUCTION WITH HUMER PIN INSERTION  1974   left hip   HARDWARE REMOVAL Left 07/21/2014   Procedure: HARDWARE REMOVAL;  Surgeon: Loreta Ave, MD;  Location: [facility] OR;  Service: Orthopedics;  Laterality: Left;   I & D EXTREMITY Right 08/21/2021   Procedure: IRRIGATION AND DEBRIDEMENT RIGHT INDEX FINGER;  Surgeon: Gomez Cleverly, MD;  Location: [facility] OR;  Service: Orthopedics;  Laterality: Right;   PATENT FORAMEN OVALE(PFO) CLOSURE N/A 02/28/2022   Procedure: PATENT FORAMEN OVALE(PFO) CLOSURE;  Surgeon: Tonny Bollman, MD;  Location: [facility] INVASIVE CV LAB;  Service: Cardiovascular;  Laterality: N/A;   ROTATOR CUFF REPAIR Right 2005 (approx)   TEE WITHOUT CARDIOVERSION N/A 01/24/2022   Procedure: TRANSESOPHAGEAL ECHOCARDIOGRAM (TEE);  Surgeon: Wendall Stade, MD;  Location: [facility] ENDOSCOPY;  Service: Cardiovascular;  Laterality: N/A;   TOTAL HIP ARTHROPLASTY Left 07/21/2014   Procedure: TOTAL HIP ARTHROPLASTY ANTERIOR APPROACH;  Surgeon: Loreta Ave, MD;  Location: [facility] OR;  Service: Orthopedics;  Laterality: Left;   TOTAL HIP ARTHROPLASTY Right 2006 (approx)   right hip replaced    Family History  Problem Relation Age of Onset   Breast cancer Mother    Cancer Mother        small intestine   Liver cancer Mother  Diabetes Mother    Diabetes Father     Diabetes Brother    Hypertension Maternal Grandmother    Heart Problems Maternal Grandmother    Diabetes Paternal Grandmother    Diabetes Paternal Grandfather    Diabetes Brother     Social History:  reports that he has never smoked. He has never used smokeless tobacco. He reports that he does not currently use alcohol. He reports that he does not use drugs.  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  LDL is consistently below 161, normal triglycerides   Lab Results  Component Value Date   CHOL 150 09/24/2022   CHOL 136 11/29/2021   CHOL 160 11/16/2021   Lab Results  Component Value Date   HDL 61.10 09/24/2022   HDL 53.80 11/29/2021   HDL 43 11/16/2021   Lab Results  Component Value Date   LDLCALC 79 09/24/2022   LDLCALC 67 11/29/2021   LDLCALC 101 (H) 11/16/2021   Lab Results  Component Value Date   TRIG 51.0 09/24/2022   TRIG 79.0 11/29/2021   TRIG 82 11/16/2021   Lab Results  Component Value Date   CHOLHDL 2 09/24/2022   CHOLHDL 3 11/29/2021   CHOLHDL 3.7 11/16/2021   Lab Results  Component Value Date   LDLDIRECT 91.0 07/11/2015    Has history of abnormal liver functions chronically Previously had not improved with stopping pravastatin  With starting Actos in 06/2020 his liver functions had improved but inconsistent Liver functions were higher even before starting his Crestor  He has a fatty liver which was seen on his ultrasound With taking 30 mg pioglitazone his liver function is normal for some time  Lab Results  Component Value Date   ALT 26 12/26/2022   ALT 36 09/28/2022   ALT 25 09/24/2022   ALT 50 03/08/2022   ALT 61 (H) 11/29/2021    BLOOD pressure: Normal without medications Also taking Jardiance   BP Readings from Last 3 Encounters:  03/18/23 125/85  03/04/23 (!) 118/50  12/31/22 128/72   Renal function normal  Lab Results   Component Value Date   CREATININE 0.73 12/26/2022   CREATININE 0.69 09/30/2022   CREATININE 0.84 09/29/2022   NEUROPATHY:  He has burning in the left foot along with discomfort, paresthesia, some numbness  Also has paresthesias in his hand He takes 1 capsules a gabapentin at breakfast, 2 at bedtime  He had an amputation of his right finger because of infection following a burn He is seeing his orthopedic surgeon regularly and he thinks that the ulcer on the left stump is getting better   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   No left lower leg edema present  ASSESSMENT/ PLAN:     Diabetes type 2 insulin-dependent:  See history of present illness for detailed discussion of current diabetes management, blood sugar patterns and problems identified  A1c is 6.9 last and fructosamine 218  He is on NovoLog once a day, Jardiance, Actos and metformin  Currently not using basal insulin Discussed that his postprandial readings are above target  Blood sugar monitor  is not available today  Recommendations:  Needs to call his DME supplier to get started on the freestyle libre sensor Needs to 12-14 units NovoLog for dinner and up to 16 or 18 if eating larger meals Also started at least 5 minutes to cover his breakfast Discussed blood sugar targets after meals   Follow-up in 3 months   There are no Patient Instructions on file for this visit.    Reather Littler 03/18/2023, 3:40 PM   Note: This office note was prepared with Dragon voice recognition system technology. Any transcriptional errors that result from this process are unintentional.

## 2023-03-20 DIAGNOSIS — Z89512 Acquired absence of left leg below knee: Secondary | ICD-10-CM | POA: Diagnosis not present

## 2023-03-20 DIAGNOSIS — E7800 Pure hypercholesterolemia, unspecified: Secondary | ICD-10-CM | POA: Diagnosis not present

## 2023-03-20 DIAGNOSIS — K760 Fatty (change of) liver, not elsewhere classified: Secondary | ICD-10-CM | POA: Diagnosis not present

## 2023-03-20 DIAGNOSIS — M160 Bilateral primary osteoarthritis of hip: Secondary | ICD-10-CM | POA: Diagnosis not present

## 2023-03-20 DIAGNOSIS — Z7901 Long term (current) use of anticoagulants: Secondary | ICD-10-CM | POA: Diagnosis not present

## 2023-03-20 DIAGNOSIS — E1142 Type 2 diabetes mellitus with diabetic polyneuropathy: Secondary | ICD-10-CM | POA: Diagnosis not present

## 2023-03-20 DIAGNOSIS — Z4781 Encounter for orthopedic aftercare following surgical amputation: Secondary | ICD-10-CM | POA: Diagnosis not present

## 2023-03-20 DIAGNOSIS — H3411 Central retinal artery occlusion, right eye: Secondary | ICD-10-CM | POA: Diagnosis not present

## 2023-03-20 DIAGNOSIS — G4733 Obstructive sleep apnea (adult) (pediatric): Secondary | ICD-10-CM | POA: Diagnosis not present

## 2023-03-21 ENCOUNTER — Ambulatory Visit: Payer: Medicare HMO | Admitting: Orthopedic Surgery

## 2023-03-21 DIAGNOSIS — Z89512 Acquired absence of left leg below knee: Secondary | ICD-10-CM | POA: Diagnosis not present

## 2023-03-21 DIAGNOSIS — Z7901 Long term (current) use of anticoagulants: Secondary | ICD-10-CM | POA: Diagnosis not present

## 2023-03-21 DIAGNOSIS — Z4781 Encounter for orthopedic aftercare following surgical amputation: Secondary | ICD-10-CM | POA: Diagnosis not present

## 2023-03-21 DIAGNOSIS — K760 Fatty (change of) liver, not elsewhere classified: Secondary | ICD-10-CM | POA: Diagnosis not present

## 2023-03-21 DIAGNOSIS — G4733 Obstructive sleep apnea (adult) (pediatric): Secondary | ICD-10-CM | POA: Diagnosis not present

## 2023-03-21 DIAGNOSIS — E7800 Pure hypercholesterolemia, unspecified: Secondary | ICD-10-CM | POA: Diagnosis not present

## 2023-03-21 DIAGNOSIS — H3411 Central retinal artery occlusion, right eye: Secondary | ICD-10-CM | POA: Diagnosis not present

## 2023-03-21 DIAGNOSIS — M160 Bilateral primary osteoarthritis of hip: Secondary | ICD-10-CM | POA: Diagnosis not present

## 2023-03-21 DIAGNOSIS — E1142 Type 2 diabetes mellitus with diabetic polyneuropathy: Secondary | ICD-10-CM | POA: Diagnosis not present

## 2023-03-25 ENCOUNTER — Ambulatory Visit: Payer: Medicare HMO

## 2023-03-25 ENCOUNTER — Ambulatory Visit (INDEPENDENT_AMBULATORY_CARE_PROVIDER_SITE_OTHER): Payer: Medicare HMO | Admitting: Orthopedic Surgery

## 2023-03-25 ENCOUNTER — Encounter: Payer: Self-pay | Admitting: Orthopedic Surgery

## 2023-03-25 ENCOUNTER — Telehealth: Payer: Self-pay

## 2023-03-25 ENCOUNTER — Ambulatory Visit: Payer: Medicare HMO | Admitting: Endocrinology

## 2023-03-25 DIAGNOSIS — Z89512 Acquired absence of left leg below knee: Secondary | ICD-10-CM

## 2023-03-25 DIAGNOSIS — S88111A Complete traumatic amputation at level between knee and ankle, right lower leg, initial encounter: Secondary | ICD-10-CM

## 2023-03-25 DIAGNOSIS — Z89511 Acquired absence of right leg below knee: Secondary | ICD-10-CM

## 2023-03-25 MED ORDER — DOXYCYCLINE HYCLATE 100 MG PO TABS: 100.0000 mg | ORAL_TABLET | Freq: Two times a day (BID) | ORAL | 0 refills | Status: DC

## 2023-03-25 NOTE — Progress Notes (Signed)
Office Visit Note   Patient: Gregory May           Date of Birth: 1961/05/30           MRN: [id number] Visit Date: 03/25/2023              Requested by: Gwenevere Abbot, MD [address] [place],  [place] [address] PCP: Gwenevere Abbot, MD  Chief Complaint  Patient presents with   Left Leg - Wound Check    Hx left BKA      HPI: Patient is a 62 year old gentleman status post left below-knee amputation.  Patient states he has been having some blue-green and yellow drainage.  Assessment & Plan: Visit Diagnoses: No diagnosis found.  Plan: Prescription was called in for doxycycline.  He is on Coumadin.  This may need to be adjusted.  Follow-Up Instructions: Return in about 1 week (around 04/01/2023).   Ortho Exam  Patient is alert, oriented, no adenopathy, well-dressed, normal affect, normal respiratory effort. Examination patient has clear serosanguineous drainage from the end of the residual limb.  The medial and lateral wounds from trauma have almost completely resolved.  Small areas of granulation tissue were touched with silver nitrate.  There is no cellulitis no open wounds no draining ulcer there is weeping edema from the residual limb with dermatitis.  Imaging: No results found. No images are attached to the encounter.  Labs: Lab Results  Component Value Date   HGBA1C 6.6 (A) 03/18/2023   HGBA1C 6.9 (H) 09/24/2022   HGBA1C 7.2 (H) 03/08/2022   ESRSEDRATE 11 11/15/2021   ESRSEDRATE 25 (H) 08/19/2021   ESRSEDRATE 8 07/15/2019   CRP 0.7 11/17/2021   CRP <0.5 11/15/2021   CRP 1.7 (H) 08/19/2021   REPTSTATUS 10/03/2022 FINAL 09/28/2022   REPTSTATUS 10/03/2022 FINAL 09/28/2022   GRAMSTAIN  08/21/2021    WBC PRESENT,BOTH PMN AND MONONUCLEAR NO ORGANISMS SEEN    CULT  09/28/2022    NO GROWTH 5 DAYS Performed at [facility], [address] [place], [place] [address]    CULT  09/28/2022    NO GROWTH 5 DAYS Performed at [facility], [address] [place], [place] [address]    [facility] STAPHYLOCOCCUS HOMINIS 08/21/2021     Lab Results  Component Value Date   ALBUMIN 3.8 12/26/2022   ALBUMIN 2.8 (L) 09/28/2022   ALBUMIN 4.1 09/24/2022    Lab Results  Component Value Date   MG 1.9 11/15/2021   Lab Results  Component Value Date   VD25OH 16 (L) 11/05/2013    No results found for: "PREALBUMIN"    Latest Ref Rng & Units 10/03/2022    2:13 AM 10/01/2022    5:02 AM 09/30/2022    4:06 AM  CBC EXTENDED  WBC 4.0 - 10.5 K/uL 12.8  12.6  14.0   RBC 4.22 - 5.81 MIL/uL 4.26  4.25  3.84   Hemoglobin 13.0 - 17.0 g/dL 64.3  32.9  51.8   HCT 39.0 - 52.0 % 39.1  39.4  36.3   Platelets 150 - 400 K/uL 413  369  325      There is no height or weight on file to calculate BMI.  Orders:  No orders of the defined types were placed in this encounter.  Meds ordered this encounter  Medications   doxycycline (VIBRA-TABS) 100 MG tablet    Sig: Take 1 tablet (100 mg total) by mouth 2 (two) times daily.  Dispense:  20 tablet    Refill:  0     Procedures: No procedures performed  Clinical Data: No additional findings.  ROS:  All other systems negative, except as noted in the HPI. Review of Systems  Objective: Vital Signs: There were no vitals taken for this visit.  Specialty Comments:  No specialty comments available.  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) 10/03/2022   Lower extremity cellulitis 09/29/2022   Acute osteomyelitis of left foot (HCC) 09/29/2022   PFO with atrial septal aneurysm    Long term (current) use of anticoagulants 02/26/2022   Right retinal embolus 01/15/2022   DM type 2 with diabetic peripheral neuropathy (HCC) 01/15/2022   Obstructive sleep apnea 01/15/2022   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   Transportation insecurity 11/23/2021   Retinal artery occlusion, central 11/15/2021    Epidermal inclusion cyst 10/18/2021   Amputated finger 09/05/2021   Osteomyelitis of finger of right hand (HCC) 08/19/2021   Hepatic steatosis 06/24/2020   Back pain 04/20/2020   Left knee pain 04/20/2020   Allergic sinusitis 04/20/2020   Elevated liver enzymes 04/20/2020   Cutaneous abscess of left foot    Diastasis of rectus abdominis 07/16/2019   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 10/07/2018   Essential hypertension 06/19/2018   Peripheral neuropathy 12/19/2017   Left below-knee amputee (HCC) 04/17/2017   Carpal tunnel syndrome 03/18/2017   Healthcare maintenance 06/21/2015   Status post below knee amputation of right lower extremity (HCC) 06/07/2015   Diabetes mellitus with carpal tunnel syndrome (HCC) 12/09/2014   DJD (degenerative joint disease) of knee 07/21/2014   Osteoarthritis, hip, bilateral 05/25/2014   Onychomycosis 10/14/2013   Pure hypercholesterolemia 09/25/2013   Type 2 diabetes with complication (HCC) 09/15/1997   Past Medical History:  Diagnosis Date   Arthritis    bilateral hips   Cutaneous abscess of left foot    Deep vein thrombosis (DVT) (HCC)    Diabetes mellitus    type II   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 Date of diagnosis February 08 2014 Duration of anticoagulation: 6 months. End date 08/12/2014.  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 Duration of anticoagulation: 8 months. End date 10/11/2014.  Anticoagulant: Lovenox 120 units  daily Switched to Eliquis on 05/25/2014 per patient preference    Pulmonary embolism (HCC)    Sebaceous cyst    on back of neck    Family History  Problem Relation Age of Onset   Breast cancer Mother    Cancer Mother        small intestine   Liver cancer Mother    Diabetes Mother    Diabetes Father    Diabetes Brother    Hypertension Maternal Grandmother    Heart Problems Maternal Grandmother    Diabetes Paternal Grandmother    Diabetes Paternal Grandfather  Diabetes Brother     Past Surgical History:  Procedure Laterality Date   AMPUTATION Right 06/03/2015   Procedure: Right Below Knee Amputation;  Surgeon: Nadara Mustard, MD;  Location: [facility] OR;  Service: Orthopedics;  Laterality: Right;   AMPUTATION Left 07/24/2019   Procedure: LEFT FOOT 3RD RAY AMPUTATION;  Surgeon: Nadara Mustard, MD;  Location: [facility] OR;  Service: Orthopedics;  Laterality: Left;   AMPUTATION Left 06/23/2021   Procedure: LEFT 2ND TOE AMPUTATION;  Surgeon: Nadara Mustard, MD;  Location: [facility] OR;  Service: Orthopedics;  Laterality: Left;   AMPUTATION Right 08/21/2021   Procedure: AMPUTATION RIGHT INDEX FINGER;  Surgeon: Gomez Cleverly, MD;  Location: [facility] OR;  Service: Orthopedics;  Laterality: Right;   AMPUTATION Left 09/29/2022   Procedure: AMPUTATION BELOW KNEE;  Surgeon: Nadara Mustard, MD;  Location: [facility] OR;  Service: Orthopedics;  Laterality: Left;   BUBBLE STUDY  01/24/2022   Procedure: BUBBLE STUDY;  Surgeon: Wendall Stade, MD;  Location: [facility] ENDOSCOPY;  Service: Cardiovascular;;   CLOSED REDUCTION WITH HUMER PIN INSERTION  1974   left hip   HARDWARE REMOVAL Left 07/21/2014   Procedure: HARDWARE REMOVAL;  Surgeon: Loreta Ave, MD;  Location: [facility] OR;  Service: Orthopedics;  Laterality: Left;   I & D EXTREMITY Right 08/21/2021   Procedure: IRRIGATION AND DEBRIDEMENT RIGHT INDEX FINGER;  Surgeon: Gomez Cleverly, MD;  Location: [facility] OR;  Service: Orthopedics;  Laterality: Right;   PATENT FORAMEN OVALE(PFO) CLOSURE N/A  02/28/2022   Procedure: PATENT FORAMEN OVALE(PFO) CLOSURE;  Surgeon: Tonny Bollman, MD;  Location: [facility] INVASIVE CV LAB;  Service: Cardiovascular;  Laterality: N/A;   ROTATOR CUFF REPAIR Right 2005 (approx)   TEE WITHOUT CARDIOVERSION N/A 01/24/2022   Procedure: TRANSESOPHAGEAL ECHOCARDIOGRAM (TEE);  Surgeon: Wendall Stade, MD;  Location: [facility] ENDOSCOPY;  Service: Cardiovascular;  Laterality: N/A;   TOTAL HIP ARTHROPLASTY Left 07/21/2014   Procedure: TOTAL HIP ARTHROPLASTY ANTERIOR APPROACH;  Surgeon: Loreta Ave, MD;  Location: [facility] OR;  Service: Orthopedics;  Laterality: Left;   TOTAL HIP ARTHROPLASTY Right 2006 (approx)   right hip replaced   Social History   Occupational History   Occupation: disabled  Tobacco Use   Smoking status: Never   Smokeless tobacco: Never  Vaping Use   Vaping status: Never Used  Substance and Sexual Activity   Alcohol use: Not Currently    Comment: beer and mixed drink maybe 5  times a month   Drug use: No   Sexual activity: Never

## 2023-03-25 NOTE — Telephone Encounter (Signed)
I called pt and offered appt today at 9:45 or 2:15 pt said that he will call back and advise if he can come in.

## 2023-03-25 NOTE — Telephone Encounter (Signed)
Hey. I was checking messages on triage phone and it appears that patient called and left message last Friday at 2pm with concerns regarding drainage from amputation. He wants Duda's assistant to call him. I'm not sure if this has already been handled by patient calling in again, and didn't see any phone encounters in chart. [phone]

## 2023-03-26 DIAGNOSIS — G4733 Obstructive sleep apnea (adult) (pediatric): Secondary | ICD-10-CM | POA: Diagnosis not present

## 2023-03-26 DIAGNOSIS — E1142 Type 2 diabetes mellitus with diabetic polyneuropathy: Secondary | ICD-10-CM | POA: Diagnosis not present

## 2023-03-26 DIAGNOSIS — Z4781 Encounter for orthopedic aftercare following surgical amputation: Secondary | ICD-10-CM | POA: Diagnosis not present

## 2023-03-26 DIAGNOSIS — E7800 Pure hypercholesterolemia, unspecified: Secondary | ICD-10-CM | POA: Diagnosis not present

## 2023-03-26 DIAGNOSIS — K760 Fatty (change of) liver, not elsewhere classified: Secondary | ICD-10-CM | POA: Diagnosis not present

## 2023-03-26 DIAGNOSIS — H3411 Central retinal artery occlusion, right eye: Secondary | ICD-10-CM | POA: Diagnosis not present

## 2023-03-26 DIAGNOSIS — M160 Bilateral primary osteoarthritis of hip: Secondary | ICD-10-CM | POA: Diagnosis not present

## 2023-03-26 DIAGNOSIS — Z7901 Long term (current) use of anticoagulants: Secondary | ICD-10-CM | POA: Diagnosis not present

## 2023-03-26 DIAGNOSIS — Z89512 Acquired absence of left leg below knee: Secondary | ICD-10-CM | POA: Diagnosis not present

## 2023-03-28 ENCOUNTER — Ambulatory Visit: Payer: Medicare HMO | Admitting: Orthopedic Surgery

## 2023-03-29 ENCOUNTER — Encounter: Payer: Self-pay | Admitting: Orthopedic Surgery

## 2023-03-29 NOTE — Progress Notes (Signed)
Office Visit Note   Patient: Gregory May           Date of Birth: September 24, 1960           MRN: [id number] Visit Date: 03/14/2023              Requested by: Gwenevere Abbot, MD [address] [place],  [place] [address] PCP: Gwenevere Abbot, MD  Chief Complaint  Patient presents with   Left Leg - Wound Check      HPI: Patient is a 62 year old gentleman who is status post left below-knee amputation.  Patient is using protein supplements.  Assessment & Plan: Visit Diagnoses:  1. Left below-knee amputee (HCC)     Plan: Dermatitis and abrasions to the medial lateral left below-knee amputation.  Patient will continue wound care with compression.  Follow-Up Instructions: Return in about 2 weeks (around 03/28/2023).   Ortho Exam  Patient is alert, oriented, no adenopathy, well-dressed, normal affect, normal respiratory effort. Examination the dermatitis is resolving the abrasions on the medial lateral residual limb are healing.  Imaging: No results found. No images are attached to the encounter.  Labs: Lab Results  Component Value Date   HGBA1C 6.6 (A) 03/18/2023   HGBA1C 6.9 (H) 09/24/2022   HGBA1C 7.2 (H) 03/08/2022   ESRSEDRATE 11 11/15/2021   ESRSEDRATE 25 (H) 08/19/2021   ESRSEDRATE 8 07/15/2019   CRP 0.7 11/17/2021   CRP <0.5 11/15/2021   CRP 1.7 (H) 08/19/2021   REPTSTATUS 10/03/2022 FINAL 09/28/2022   REPTSTATUS 10/03/2022 FINAL 09/28/2022   GRAMSTAIN  08/21/2021    WBC PRESENT,BOTH PMN AND MONONUCLEAR NO ORGANISMS SEEN    CULT  09/28/2022    NO GROWTH 5 DAYS Performed at [facility], [address] [place], [place] [address]    CULT  09/28/2022    NO GROWTH 5 DAYS Performed at [facility], [address] [place], [place] [address]    [facility] STAPHYLOCOCCUS HOMINIS 08/21/2021     Lab Results  Component Value Date   ALBUMIN 3.8 12/26/2022   ALBUMIN 2.8 (L) 09/28/2022   ALBUMIN 4.1 09/24/2022    Lab Results  Component Value  Date   MG 1.9 11/15/2021   Lab Results  Component Value Date   VD25OH 16 (L) 11/05/2013    No results found for: "PREALBUMIN"    Latest Ref Rng & Units 10/03/2022    2:13 AM 10/01/2022    5:02 AM 09/30/2022    4:06 AM  CBC EXTENDED  WBC 4.0 - 10.5 K/uL 12.8  12.6  14.0   RBC 4.22 - 5.81 MIL/uL 4.26  4.25  3.84   Hemoglobin 13.0 - 17.0 g/dL 84.6  96.2  95.2   HCT 39.0 - 52.0 % 39.1  39.4  36.3   Platelets 150 - 400 K/uL 413  369  325      There is no height or weight on file to calculate BMI.  Orders:  No orders of the defined types were placed in this encounter.  Meds ordered this encounter  Medications   HYDROcodone-acetaminophen (NORCO/VICODIN) 5-325 MG tablet    Sig: Take 1 tablet by mouth every 6 (six) hours as needed for moderate pain.    Dispense:  30 tablet    Refill:  0     Procedures: No procedures performed  Clinical Data: No additional findings.  ROS:  All other systems negative, except as noted in the HPI. Review of Systems  Objective: Vital  Signs: There were no vitals taken for this visit.  Specialty Comments:  No specialty comments available.  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) 10/03/2022   Lower extremity cellulitis 09/29/2022   Acute osteomyelitis of left foot (HCC) 09/29/2022   PFO with atrial septal aneurysm    Long term (current) use of anticoagulants 02/26/2022   Right retinal embolus 01/15/2022   DM type 2 with diabetic peripheral neuropathy (HCC) 01/15/2022   Obstructive sleep apnea 01/15/2022   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   Transportation insecurity 11/23/2021   Retinal artery occlusion, central 11/15/2021   Epidermal inclusion cyst 10/18/2021   Amputated finger 09/05/2021   Osteomyelitis of finger of right hand (HCC) 08/19/2021   Hepatic steatosis 06/24/2020   Back pain 04/20/2020   Left knee pain 04/20/2020    Allergic sinusitis 04/20/2020   Elevated liver enzymes 04/20/2020   Cutaneous abscess of left foot    Diastasis of rectus abdominis 07/16/2019   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 10/07/2018   Essential hypertension 06/19/2018   Peripheral neuropathy 12/19/2017   Left below-knee amputee (HCC) 04/17/2017   Carpal tunnel syndrome 03/18/2017   Healthcare maintenance 06/21/2015   Status post below knee amputation of right lower extremity (HCC) 06/07/2015   Diabetes mellitus with carpal tunnel syndrome (HCC) 12/09/2014   DJD (degenerative joint disease) of knee 07/21/2014   Osteoarthritis, hip, bilateral 05/25/2014   Onychomycosis 10/14/2013   Pure hypercholesterolemia 09/25/2013   Type 2 diabetes with complication (HCC) 09/15/1997   Past Medical History:  Diagnosis Date   Arthritis    bilateral hips   Cutaneous abscess of left foot    Deep vein thrombosis (DVT) (HCC)    Diabetes mellitus    type II   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 Date of diagnosis February 08 2014 Duration of anticoagulation: 6 months. End date 08/12/2014.  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 Duration of anticoagulation: 8 months. End date 10/11/2014.  Anticoagulant: Lovenox 120 units daily Switched to Eliquis on 05/25/2014 per patient preference    Pulmonary embolism (HCC)    Sebaceous cyst    on back of neck    Family History  Problem Relation Age of Onset   Breast cancer Mother    Cancer  Mother        small intestine   Liver cancer Mother    Diabetes Mother    Diabetes Father    Diabetes Brother    Hypertension Maternal Grandmother    Heart Problems Maternal Grandmother    Diabetes Paternal Grandmother    Diabetes Paternal Grandfather    Diabetes Brother     Past Surgical History:  Procedure Laterality Date   AMPUTATION Right 06/03/2015   Procedure: Right Below Knee Amputation;  Surgeon: Nadara Mustard, MD;  Location: [facility] OR;  Service: Orthopedics;  Laterality: Right;  AMPUTATION Left 07/24/2019   Procedure: LEFT FOOT 3RD RAY AMPUTATION;  Surgeon: Nadara Mustard, MD;  Location: [facility] OR;  Service: Orthopedics;  Laterality: Left;   AMPUTATION Left 06/23/2021   Procedure: LEFT 2ND TOE AMPUTATION;  Surgeon: Nadara Mustard, MD;  Location: [facility] OR;  Service: Orthopedics;  Laterality: Left;   AMPUTATION Right 08/21/2021   Procedure: AMPUTATION RIGHT INDEX FINGER;  Surgeon: Gomez Cleverly, MD;  Location: [facility] OR;  Service: Orthopedics;  Laterality: Right;   AMPUTATION Left 09/29/2022   Procedure: AMPUTATION BELOW KNEE;  Surgeon: Nadara Mustard, MD;  Location: [facility] OR;  Service: Orthopedics;  Laterality: Left;   BUBBLE STUDY  01/24/2022   Procedure: BUBBLE STUDY;  Surgeon: Wendall Stade, MD;  Location: [facility] ENDOSCOPY;  Service: Cardiovascular;;   CLOSED REDUCTION WITH HUMER PIN INSERTION  1974   left hip   HARDWARE REMOVAL Left 07/21/2014   Procedure: HARDWARE REMOVAL;  Surgeon: Loreta Ave, MD;  Location: [facility] OR;  Service: Orthopedics;  Laterality: Left;   I & D EXTREMITY Right 08/21/2021   Procedure: IRRIGATION AND DEBRIDEMENT RIGHT INDEX FINGER;  Surgeon: Gomez Cleverly, MD;  Location: [facility] OR;  Service: Orthopedics;  Laterality: Right;   PATENT FORAMEN OVALE(PFO) CLOSURE N/A 02/28/2022   Procedure: PATENT FORAMEN OVALE(PFO) CLOSURE;  Surgeon: Tonny Bollman, MD;  Location: [facility] INVASIVE CV LAB;  Service: Cardiovascular;  Laterality: N/A;   ROTATOR CUFF REPAIR Right 2005 (approx)   TEE  WITHOUT CARDIOVERSION N/A 01/24/2022   Procedure: TRANSESOPHAGEAL ECHOCARDIOGRAM (TEE);  Surgeon: Wendall Stade, MD;  Location: [facility] ENDOSCOPY;  Service: Cardiovascular;  Laterality: N/A;   TOTAL HIP ARTHROPLASTY Left 07/21/2014   Procedure: TOTAL HIP ARTHROPLASTY ANTERIOR APPROACH;  Surgeon: Loreta Ave, MD;  Location: [facility] OR;  Service: Orthopedics;  Laterality: Left;   TOTAL HIP ARTHROPLASTY Right 2006 (approx)   right hip replaced   Social History   Occupational History   Occupation: disabled  Tobacco Use   Smoking status: Never   Smokeless tobacco: Never  Vaping Use   Vaping status: Never Used  Substance and Sexual Activity   Alcohol use: Not Currently    Comment: beer and mixed drink maybe 5  times a month   Drug use: No   Sexual activity: Never

## 2023-04-02 ENCOUNTER — Ambulatory Visit: Payer: Medicare HMO | Admitting: Pharmacist

## 2023-04-02 ENCOUNTER — Ambulatory Visit (INDEPENDENT_AMBULATORY_CARE_PROVIDER_SITE_OTHER): Payer: Medicare HMO | Admitting: Family

## 2023-04-02 ENCOUNTER — Encounter: Payer: Self-pay | Admitting: Family

## 2023-04-02 DIAGNOSIS — H3410 Central retinal artery occlusion, unspecified eye: Secondary | ICD-10-CM | POA: Diagnosis not present

## 2023-04-02 DIAGNOSIS — Z89512 Acquired absence of left leg below knee: Secondary | ICD-10-CM

## 2023-04-02 DIAGNOSIS — L03116 Cellulitis of left lower limb: Secondary | ICD-10-CM

## 2023-04-02 DIAGNOSIS — Z7901 Long term (current) use of anticoagulants: Secondary | ICD-10-CM | POA: Diagnosis not present

## 2023-04-02 DIAGNOSIS — H349 Unspecified retinal vascular occlusion: Secondary | ICD-10-CM

## 2023-04-02 LAB — POCT INR: INR: 2 (ref 2.0–3.0)

## 2023-04-02 MED ORDER — DOXYCYCLINE HYCLATE 100 MG PO TABS: 100.0000 mg | ORAL_TABLET | Freq: Two times a day (BID) | ORAL | 0 refills | Status: DC

## 2023-04-02 NOTE — Patient Instructions (Signed)
Patient instructed to take medications as defined in the Anti-coagulation Track section of this encounter.  Patient instructed to take today's dose.  Patient instructed to take 2 & 1/2 tablets of your 5mg  peach-colored warfarin tablets on Sundays, Tuesdays and Thursdays. On all OTHER DAYS (Mondays, Wednesdays, Fridays and Saturddays), take only two (2) tablets.  Patient verbalized understanding of these instructions.

## 2023-04-02 NOTE — Progress Notes (Signed)
Anticoagulation Management Gregory May is a 62 y.o. male who reports to the clinic for monitoring of warfarin treatment.    Indication:  Retinal artery occlusion, central; Retinal artery embolism, Right eye; long term current use of oral anticoagulant warfarin with target INR 2.0 - 3.0.   Duration: indefinite Supervising physician:  Reymundo Poll, MD  Anticoagulation Clinic Visit History: Patient does not report signs/symptoms of bleeding or thromboembolism  Other recent changes: No diet, medications, lifestyle except as noted in patient findings.  Anticoagulation Episode Summary     Current INR goal:  2.0-3.0  TTR:  65.2% (11.3 mo)  Next INR check:  04/22/2023  INR from last check:  2.0 (04/02/2023)  Weekly max warfarin dose:  --  Target end date:  --  INR check location:  --  Preferred lab:  --  Send INR reminders to:  --   Indications   Pulmonary emboli (HCC) (Resolved) [I26.99] DVT (deep venous thrombosis) (HCC) (Resolved) [I82.409] Retinal artery occlusion central [H34.10] Right retinal embolus [H34.9] Long term (current) use of anticoagulants [Z79.01]        Comments:  --         No Known Allergies  Current Outpatient Medications:    Accu-Chek FastClix Lancets MISC, Use Accu Chek Fastclix lancets to check blood sugar three times daily. DX:E11.65, Disp: 300 each, Rfl: 2   ACCU-CHEK GUIDE test strip, TEST BLOOD SUGAR THREE TIMES DAILY AS DIRECTED, Disp: 300 strip, Rfl: 3   acetaminophen (TYLENOL) 500 MG tablet, Take 1,000 mg by mouth as needed for mild pain or moderate pain., Disp: , Rfl:    APPLE CIDER VINEGAR PO, Take 1 tablet by mouth daily., Disp: , Rfl:    doxycycline (VIBRA-TABS) 100 MG tablet, Take 1 tablet (100 mg total) by mouth 2 (two) times daily., Disp: 28 tablet, Rfl: 0   empagliflozin (JARDIANCE) 25 MG TABS tablet, Take 1 tablet (25 mg total) by mouth daily., Disp: 90 tablet, Rfl: 3   gabapentin (NEURONTIN) 300 MG capsule, TAKE 1 CAPSULE FOUR  TIMES DAILY AS NEEDED, Disp: 360 capsule, Rfl: 3   HYDROcodone-acetaminophen (NORCO/VICODIN) 5-325 MG tablet, Take 1 tablet by mouth every 6 (six) hours as needed for moderate pain., Disp: 30 tablet, Rfl: 0   insulin aspart (NOVOLOG) 100 UNIT/ML injection, Inject 0-9 Units into the skin 3 (three) times daily with meals. Correction coverage: Sensitive (thin, NPO, renal)  CBG < 70: Implement Hypoglycemia Standing Orders and refer to Hypoglycemia Standing Orders sidebar report  CBG 70 - 120: 0 units  CBG 121 - 150: 1 unit  CBG 151 - 200: 2 units  CBG 201 - 250: 3 units  CBG 251 - 300: 5 units  CBG 301 - 350: 7 units  CBG 351 - 400 9 units  CBG > 400  10 units (Patient taking differently: Inject 0-9 Units into the skin once. Correction coverage: Sensitive (thin, NPO, renal)  CBG < 70: Implement Hypoglycemia Standing Orders and refer to Hypoglycemia Standing Orders sidebar report  CBG 70 - 120: 0 units  CBG 121 - 150: 1 unit  CBG 151 - 200: 2 units  CBG 201 - 250: 3 units  CBG 251 - 300: 5 units  CBG 301 - 350: 7 units  CBG 351 - 400 9 units  CBG > 400  10 units), Disp: 10 mL, Rfl: 11   Insulin Syringes, Disposable, U-100 0.5 ML MISC, Use to inject insulin, Disp: 100 each, Rfl: 2   metFORMIN (GLUCOPHAGE-XR) 750 MG  24 hr tablet, TAKE 2 TABLETS EVERY DAY, Disp: 180 tablet, Rfl: 3   Multiple Vitamin (MULTIVITAMIN WITH MINERALS) TABS tablet, Take 1 tablet by mouth every morning. Centrum, Disp: , Rfl:    pioglitazone (ACTOS) 30 MG tablet, TAKE 1 TABLET EVERY DAY, Disp: 90 tablet, Rfl: 3   prednisoLONE acetate (PRED FORTE) 1 % ophthalmic suspension, Place 1 drop into the right eye every evening., Disp: , Rfl:    rosuvastatin (CRESTOR) 20 MG tablet, TAKE 1 TABLET EVERY DAY, Disp: 90 tablet, Rfl: 3   warfarin (COUMADIN) 5 MG tablet, Take 2 (two) of your 5 mg peach-colored warfarin tablets on Mondays, Tuesdays,Wednesdays and Fridays. All OTHER DAYS, (Sundays, Thursdays and Saturdays) take two and one-half ( 2 & 1/2  tablets.), Disp: 64 tablet, Rfl: 3   zinc sulfate 220 (50 Zn) MG capsule, Take 1 capsule (220 mg total) by mouth daily., Disp: , Rfl:    ascorbic acid (VITAMIN C) 1000 MG tablet, Take 1 tablet (1,000 mg total) by mouth daily. (Patient not taking: Reported on 03/18/2023), Disp: , Rfl:    fluticasone (FLONASE) 50 MCG/ACT nasal spray, USE 2 SPRAY(S) IN EACH NOSTRIL AT BEDTIME AS NEEDED FOR  SINUS  DRAINAGE (Patient not taking: Reported on 03/18/2023), Disp: 16 g, Rfl: 0 Past Medical History:  Diagnosis Date   Arthritis    bilateral hips   Cutaneous abscess of left foot    Deep vein thrombosis (DVT) (HCC)    Diabetes mellitus    type II   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 Date of diagnosis February 08 2014 Duration of anticoagulation: 6 months. End date 08/12/2014.  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 Duration of anticoagulation: 8 months. End date 10/11/2014.  Anticoagulant: Lovenox 120 units daily Switched to Eliquis on 05/25/2014 per patient preference    Pulmonary embolism (HCC)    Sebaceous cyst    on back of neck   Social History   Socioeconomic History   Marital status: Single    Spouse name: Not on file   Number of children: 0   Years of education: Not on file   Highest education level: Not on file  Occupational History   Occupation: disabled  Tobacco Use   Smoking status: Never   Smokeless tobacco: Never  Vaping Use   Vaping status: Never Used  Substance and Sexual Activity   Alcohol use: Not Currently     Comment: beer and mixed drink maybe 5  times a month   Drug use: No   Sexual activity: Never  Other Topics Concern   Not on file  Social History Narrative   Not on file   Social Determinants of Health   Financial Resource Strain: Low Risk  (01/16/2023)   Overall Financial Resource Strain (CARDIA)    Difficulty of Paying Living Expenses: Not hard at all  Food Insecurity: No Food Insecurity (01/16/2023)   Hunger Vital Sign    Worried About Running Out of Food in the  Last Year: Never true    Ran Out of Food in the Last Year: Never true  Transportation Needs: No Transportation Needs (01/16/2023)   PRAPARE - Administrator, Civil Service (Medical): No    Lack of Transportation (Non-Medical): No  Physical Activity: Inactive (01/16/2023)   Exercise Vital Sign    Days of Exercise per Week: 0 days    Minutes of Exercise per Session: 0 min  Stress: No Stress Concern Present (01/16/2023)   [employer] of Occupational Health - Occupational Stress Questionnaire    Feeling of Stress : Only a little  Social Connections: Socially Isolated (01/16/2023)   Social Connection and Isolation Panel [NHANES]    Frequency of Communication with Friends and Family: More than three times a week    Frequency of Social Gatherings with Friends and Family: Twice a week    Attends Religious Services: Never    Database administrator or Organizations: No    Attends Engineer, structural: Never    Marital Status: Never married   Family History  Problem Relation Age of Onset   Breast cancer Mother    Cancer Mother        small intestine   Liver cancer Mother    Diabetes Mother    Diabetes Father    Diabetes Brother    Hypertension Maternal Grandmother    Heart Problems Maternal Grandmother    Diabetes Paternal Grandmother    Diabetes Paternal Grandfather    Diabetes Brother     ASSESSMENT Recent Results: The most recent result is correlated with 72.5 mg per week: Lab Results   Component Value Date   INR 2.0 04/02/2023   INR 2.5 03/04/2023   INR 3.0 02/11/2023    Anticoagulation Dosing:   INR today: Therapeutic  PLAN Weekly dose was increased by 7% to 77.5 mg per week  Patient Instructions  Patient instructed to take medications as defined in the Anti-coagulation Track section of this encounter.  Patient instructed to take today's dose.  Patient instructed to take 2 & 1/2 tablets of your 5mg  peach-colored warfarin tablets on Sundays, Tuesdays and Thursdays. On all OTHER DAYS (Mondays, Wednesdays, Fridays and Saturddays), take only two (2) tablets.  Patient verbalized understanding of these instructions.  Patient advised to contact clinic or seek medical attention if signs/symptoms of bleeding or thromboembolism occur.  Patient verbalized understanding by repeating back information and was advised to contact me if further medication-related questions arise. Patient was also provided an information handout.  Follow-up Return in 20 days (on 04/22/2023) for Follow up INR.  Elicia Lamp, PharmD, CPP  15 minutes spent face-to-face with the patient during the encounter. 50% of time spent on education, including signs/sx bleeding and clotting, as well as food and drug interactions with warfarin. 50% of time was spent on fingerprick POC INR sample collection,processing, results determination, and documentation in [email or web address].

## 2023-04-02 NOTE — Progress Notes (Signed)
Office Visit Note   Patient: Gregory May           Date of Birth: 08/21/1960           MRN: [id number] Visit Date: 04/02/2023              Requested by: Gwenevere Abbot, MD [address] [place],  [place] [address] PCP: Gwenevere Abbot, MD  Chief Complaint  Patient presents with   Left Leg - Follow-up    Hx bka      HPI: The patient is a 62 year old gentleman who is seen in follow-up for cellulitis to his left residual limb.  He is currently completing a 10-day course of doxycycline.  Has been pleased with the resolution of the drainage is eager to get set up with a new prosthesis for the left  Assessment & Plan: Visit Diagnoses: No diagnosis found.  Plan: Will continue him on doxycycline.  Daily dose of cleansing.  Shrinker.  Close monitoring will follow-up in the office in 2 weeks  Follow-Up Instructions: Return in about 16 days (around 04/18/2023).   Ortho Exam  Patient is alert, oriented, no adenopathy, well-dressed, normal affect, normal respiratory effort. On examination of the left residual limb he has 1 pinpoint opening over the distal tibia with bloody drainage laterally there is a 2 cm in diameter open ulcer also with full granulation bloody drainage there is no erythema warmth or purulence  The limb has consolidated well  Imaging: No results found. No images are attached to the encounter.  Labs: Lab Results  Component Value Date   HGBA1C 6.6 (A) 03/18/2023   HGBA1C 6.9 (H) 09/24/2022   HGBA1C 7.2 (H) 03/08/2022   ESRSEDRATE 11 11/15/2021   ESRSEDRATE 25 (H) 08/19/2021   ESRSEDRATE 8 07/15/2019   CRP 0.7 11/17/2021   CRP <0.5 11/15/2021   CRP 1.7 (H) 08/19/2021   REPTSTATUS 10/03/2022 FINAL 09/28/2022   REPTSTATUS 10/03/2022 FINAL 09/28/2022   GRAMSTAIN  08/21/2021    WBC PRESENT,BOTH PMN AND MONONUCLEAR NO ORGANISMS SEEN    CULT  09/28/2022    NO GROWTH 5 DAYS Performed at [facility], [address] [place], [place] [address]     CULT  09/28/2022    NO GROWTH 5 DAYS Performed at [facility], [address], [place], [place] [address]    [facility] STAPHYLOCOCCUS HOMINIS 08/21/2021     Lab Results  Component Value Date   ALBUMIN 3.8 12/26/2022   ALBUMIN 2.8 (L) 09/28/2022   ALBUMIN 4.1 09/24/2022    Lab Results  Component Value Date   MG 1.9 11/15/2021   Lab Results  Component Value Date   VD25OH 16 (L) 11/05/2013    No results found for: "PREALBUMIN"    Latest Ref Rng & Units 10/03/2022    2:13 AM 10/01/2022    5:02 AM 09/30/2022    4:06 AM  CBC EXTENDED  WBC 4.0 - 10.5 K/uL 12.8  12.6  14.0   RBC 4.22 - 5.81 MIL/uL 4.26  4.25  3.84   Hemoglobin 13.0 - 17.0 g/dL 21.3  08.6  57.8   HCT 39.0 - 52.0 % 39.1  39.4  36.3   Platelets 150 - 400 K/uL 413  369  325      There is no height or weight on file to calculate BMI.  Orders:  No orders of the defined types were placed in this encounter.  Meds ordered this encounter  Medications  doxycycline (VIBRA-TABS) 100 MG tablet    Sig: Take 1 tablet (100 mg total) by mouth 2 (two) times daily.    Dispense:  28 tablet    Refill:  0     Procedures: No procedures performed  Clinical Data: No additional findings.  ROS:  All other systems negative, except as noted in the HPI. Review of Systems  Objective: Vital Signs: There were no vitals taken for this visit.  Specialty Comments:  No specialty comments available.  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) 10/03/2022   Lower extremity cellulitis 09/29/2022   Acute osteomyelitis of left foot (HCC) 09/29/2022   PFO with atrial septal aneurysm    Long term (current) use of anticoagulants 02/26/2022   Right retinal embolus 01/15/2022   DM type 2 with diabetic peripheral neuropathy (HCC) 01/15/2022   Obstructive sleep apnea 01/15/2022   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   Transportation  insecurity 11/23/2021   Retinal artery occlusion, central 11/15/2021   Epidermal inclusion cyst 10/18/2021   Amputated finger 09/05/2021   Osteomyelitis of finger of right hand (HCC) 08/19/2021   Hepatic steatosis 06/24/2020   Back pain 04/20/2020   Left knee pain 04/20/2020   Allergic sinusitis 04/20/2020   Elevated liver enzymes 04/20/2020   Cutaneous abscess of left foot    Diastasis of rectus abdominis 07/16/2019   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 10/07/2018   Essential hypertension 06/19/2018   Peripheral neuropathy 12/19/2017   Left below-knee amputee (HCC) 04/17/2017   Carpal tunnel syndrome 03/18/2017   Healthcare maintenance 06/21/2015   Status post below knee amputation of right lower extremity (HCC) 06/07/2015   Diabetes mellitus with carpal tunnel syndrome (HCC) 12/09/2014   DJD (degenerative joint disease) of knee 07/21/2014   Osteoarthritis, hip, bilateral 05/25/2014   Onychomycosis 10/14/2013   Pure hypercholesterolemia 09/25/2013   Type 2 diabetes with complication (HCC) 09/15/1997   Past Medical History:  Diagnosis Date   Arthritis    bilateral hips   Cutaneous abscess of left foot    Deep vein thrombosis (DVT) (HCC)    Diabetes mellitus    type II   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 Date of diagnosis February 08 2014 Duration of anticoagulation: 6 months. End date 08/12/2014.  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 Duration of  anticoagulation: 8 months. End date 10/11/2014.  Anticoagulant: Lovenox 120 units daily Switched to Eliquis on 05/25/2014 per patient preference    Pulmonary embolism (HCC)    Sebaceous cyst    on back of neck    Family History  Problem Relation Age of Onset   Breast cancer Mother    Cancer Mother        small intestine   Liver cancer Mother    Diabetes Mother    Diabetes Father    Diabetes Brother    Hypertension  Maternal Grandmother    Heart Problems Maternal Grandmother    Diabetes Paternal Grandmother    Diabetes Paternal Grandfather    Diabetes Brother     Past Surgical History:  Procedure Laterality Date   AMPUTATION Right 06/03/2015   Procedure: Right Below Knee Amputation;  Surgeon: Nadara Mustard, MD;  Location: [facility] OR;  Service: Orthopedics;  Laterality: Right;   AMPUTATION Left 07/24/2019   Procedure: LEFT FOOT 3RD RAY AMPUTATION;  Surgeon: Nadara Mustard, MD;  Location: [facility] OR;  Service: Orthopedics;  Laterality: Left;   AMPUTATION Left 06/23/2021   Procedure: LEFT 2ND TOE AMPUTATION;  Surgeon: Nadara Mustard, MD;  Location: [facility] OR;  Service: Orthopedics;  Laterality: Left;   AMPUTATION Right 08/21/2021   Procedure: AMPUTATION RIGHT INDEX FINGER;  Surgeon: Gomez Cleverly, MD;  Location: [facility] OR;  Service: Orthopedics;  Laterality: Right;   AMPUTATION Left 09/29/2022   Procedure: AMPUTATION BELOW KNEE;  Surgeon: Nadara Mustard, MD;  Location: [facility] OR;  Service: Orthopedics;  Laterality: Left;   BUBBLE STUDY  01/24/2022   Procedure: BUBBLE STUDY;  Surgeon: Wendall Stade, MD;  Location: [facility] ENDOSCOPY;  Service: Cardiovascular;;   CLOSED REDUCTION WITH HUMER PIN INSERTION  1974   left hip   HARDWARE REMOVAL Left 07/21/2014   Procedure: HARDWARE REMOVAL;  Surgeon: Loreta Ave, MD;  Location: [facility] OR;  Service: Orthopedics;  Laterality: Left;   I & D EXTREMITY Right 08/21/2021   Procedure: IRRIGATION AND DEBRIDEMENT RIGHT INDEX FINGER;  Surgeon: Gomez Cleverly, MD;  Location: [facility] OR;   Service: Orthopedics;  Laterality: Right;   PATENT FORAMEN OVALE(PFO) CLOSURE N/A 02/28/2022   Procedure: PATENT FORAMEN OVALE(PFO) CLOSURE;  Surgeon: Tonny Bollman, MD;  Location: [facility] INVASIVE CV LAB;  Service: Cardiovascular;  Laterality: N/A;   ROTATOR CUFF REPAIR Right 2005 (approx)   TEE WITHOUT CARDIOVERSION N/A 01/24/2022   Procedure: TRANSESOPHAGEAL ECHOCARDIOGRAM (TEE);  Surgeon: Wendall Stade, MD;  Location: [facility] ENDOSCOPY;  Service: Cardiovascular;  Laterality: N/A;   TOTAL HIP ARTHROPLASTY Left 07/21/2014   Procedure: TOTAL HIP ARTHROPLASTY ANTERIOR APPROACH;  Surgeon: Loreta Ave, MD;  Location: [facility] OR;  Service: Orthopedics;  Laterality: Left;   TOTAL HIP ARTHROPLASTY Right 2006 (approx)   right hip replaced   Social History   Occupational History   Occupation: disabled  Tobacco Use   Smoking status: Never   Smokeless tobacco: Never  Vaping Use   Vaping status: Never Used  Substance and Sexual Activity   Alcohol use: Not Currently    Comment: beer and mixed drink maybe 5  times a month   Drug use: No   Sexual activity: Never

## 2023-04-03 DIAGNOSIS — Z89512 Acquired absence of left leg below knee: Secondary | ICD-10-CM | POA: Diagnosis not present

## 2023-04-03 DIAGNOSIS — E7800 Pure hypercholesterolemia, unspecified: Secondary | ICD-10-CM | POA: Diagnosis not present

## 2023-04-03 DIAGNOSIS — G4733 Obstructive sleep apnea (adult) (pediatric): Secondary | ICD-10-CM | POA: Diagnosis not present

## 2023-04-03 DIAGNOSIS — M160 Bilateral primary osteoarthritis of hip: Secondary | ICD-10-CM | POA: Diagnosis not present

## 2023-04-03 DIAGNOSIS — K760 Fatty (change of) liver, not elsewhere classified: Secondary | ICD-10-CM | POA: Diagnosis not present

## 2023-04-03 DIAGNOSIS — Z4781 Encounter for orthopedic aftercare following surgical amputation: Secondary | ICD-10-CM | POA: Diagnosis not present

## 2023-04-03 DIAGNOSIS — Z7901 Long term (current) use of anticoagulants: Secondary | ICD-10-CM | POA: Diagnosis not present

## 2023-04-03 DIAGNOSIS — H3411 Central retinal artery occlusion, right eye: Secondary | ICD-10-CM | POA: Diagnosis not present

## 2023-04-03 DIAGNOSIS — E1142 Type 2 diabetes mellitus with diabetic polyneuropathy: Secondary | ICD-10-CM | POA: Diagnosis not present

## 2023-04-11 DIAGNOSIS — Z7901 Long term (current) use of anticoagulants: Secondary | ICD-10-CM | POA: Diagnosis not present

## 2023-04-11 DIAGNOSIS — M160 Bilateral primary osteoarthritis of hip: Secondary | ICD-10-CM | POA: Diagnosis not present

## 2023-04-11 DIAGNOSIS — Z4781 Encounter for orthopedic aftercare following surgical amputation: Secondary | ICD-10-CM | POA: Diagnosis not present

## 2023-04-11 DIAGNOSIS — H3411 Central retinal artery occlusion, right eye: Secondary | ICD-10-CM | POA: Diagnosis not present

## 2023-04-11 DIAGNOSIS — E7800 Pure hypercholesterolemia, unspecified: Secondary | ICD-10-CM | POA: Diagnosis not present

## 2023-04-11 DIAGNOSIS — E1142 Type 2 diabetes mellitus with diabetic polyneuropathy: Secondary | ICD-10-CM | POA: Diagnosis not present

## 2023-04-11 DIAGNOSIS — K760 Fatty (change of) liver, not elsewhere classified: Secondary | ICD-10-CM | POA: Diagnosis not present

## 2023-04-11 DIAGNOSIS — Z89512 Acquired absence of left leg below knee: Secondary | ICD-10-CM | POA: Diagnosis not present

## 2023-04-11 DIAGNOSIS — G4733 Obstructive sleep apnea (adult) (pediatric): Secondary | ICD-10-CM | POA: Diagnosis not present

## 2023-04-16 DIAGNOSIS — Z7901 Long term (current) use of anticoagulants: Secondary | ICD-10-CM | POA: Diagnosis not present

## 2023-04-16 DIAGNOSIS — Z89512 Acquired absence of left leg below knee: Secondary | ICD-10-CM | POA: Diagnosis not present

## 2023-04-16 DIAGNOSIS — H3411 Central retinal artery occlusion, right eye: Secondary | ICD-10-CM | POA: Diagnosis not present

## 2023-04-16 DIAGNOSIS — K760 Fatty (change of) liver, not elsewhere classified: Secondary | ICD-10-CM | POA: Diagnosis not present

## 2023-04-16 DIAGNOSIS — M160 Bilateral primary osteoarthritis of hip: Secondary | ICD-10-CM | POA: Diagnosis not present

## 2023-04-16 DIAGNOSIS — Z4781 Encounter for orthopedic aftercare following surgical amputation: Secondary | ICD-10-CM | POA: Diagnosis not present

## 2023-04-16 DIAGNOSIS — G4733 Obstructive sleep apnea (adult) (pediatric): Secondary | ICD-10-CM | POA: Diagnosis not present

## 2023-04-16 DIAGNOSIS — E7800 Pure hypercholesterolemia, unspecified: Secondary | ICD-10-CM | POA: Diagnosis not present

## 2023-04-16 DIAGNOSIS — E1142 Type 2 diabetes mellitus with diabetic polyneuropathy: Secondary | ICD-10-CM | POA: Diagnosis not present

## 2023-04-17 DIAGNOSIS — E7800 Pure hypercholesterolemia, unspecified: Secondary | ICD-10-CM | POA: Diagnosis not present

## 2023-04-17 DIAGNOSIS — E1142 Type 2 diabetes mellitus with diabetic polyneuropathy: Secondary | ICD-10-CM | POA: Diagnosis not present

## 2023-04-17 DIAGNOSIS — Z89512 Acquired absence of left leg below knee: Secondary | ICD-10-CM | POA: Diagnosis not present

## 2023-04-17 DIAGNOSIS — Z7901 Long term (current) use of anticoagulants: Secondary | ICD-10-CM | POA: Diagnosis not present

## 2023-04-17 DIAGNOSIS — Z4781 Encounter for orthopedic aftercare following surgical amputation: Secondary | ICD-10-CM | POA: Diagnosis not present

## 2023-04-17 DIAGNOSIS — H3411 Central retinal artery occlusion, right eye: Secondary | ICD-10-CM | POA: Diagnosis not present

## 2023-04-17 DIAGNOSIS — K760 Fatty (change of) liver, not elsewhere classified: Secondary | ICD-10-CM | POA: Diagnosis not present

## 2023-04-17 DIAGNOSIS — M160 Bilateral primary osteoarthritis of hip: Secondary | ICD-10-CM | POA: Diagnosis not present

## 2023-04-17 DIAGNOSIS — G4733 Obstructive sleep apnea (adult) (pediatric): Secondary | ICD-10-CM | POA: Diagnosis not present

## 2023-04-22 ENCOUNTER — Encounter: Payer: Self-pay | Admitting: Orthopedic Surgery

## 2023-04-22 ENCOUNTER — Ambulatory Visit (INDEPENDENT_AMBULATORY_CARE_PROVIDER_SITE_OTHER): Payer: Medicare HMO | Admitting: Pharmacist

## 2023-04-22 ENCOUNTER — Ambulatory Visit: Payer: Medicare HMO | Admitting: Orthopedic Surgery

## 2023-04-22 DIAGNOSIS — H3411 Central retinal artery occlusion, right eye: Secondary | ICD-10-CM

## 2023-04-22 DIAGNOSIS — Z89511 Acquired absence of right leg below knee: Secondary | ICD-10-CM

## 2023-04-22 DIAGNOSIS — Z89512 Acquired absence of left leg below knee: Secondary | ICD-10-CM | POA: Diagnosis not present

## 2023-04-22 DIAGNOSIS — Z7901 Long term (current) use of anticoagulants: Secondary | ICD-10-CM

## 2023-04-22 DIAGNOSIS — H349 Unspecified retinal vascular occlusion: Secondary | ICD-10-CM | POA: Diagnosis not present

## 2023-04-22 DIAGNOSIS — L03116 Cellulitis of left lower limb: Secondary | ICD-10-CM | POA: Diagnosis not present

## 2023-04-22 DIAGNOSIS — S88111A Complete traumatic amputation at level between knee and ankle, right lower leg, initial encounter: Secondary | ICD-10-CM

## 2023-04-22 LAB — POCT INR: INR: 2.8 (ref 2.0–3.0)

## 2023-04-22 NOTE — Patient Instructions (Signed)
Patient instructed to take medications as defined in the Anti-coagulation Track section of this encounter.  Patient instructed to take today's dose.  Patient instructed to take two tablets of your 5 mg peach-colored warfarin tablets on Mondays, Wednesdays and Fridays. All OTHER DAYS, take two-and-one-half (2 & 1/2) tablets of your 5 mg peach-colored warfarin tablets. Patient verbalized understanding of these instructions.

## 2023-04-22 NOTE — Progress Notes (Signed)
Office Visit Note   Patient: SVEN TRIBE           Date of Birth: 1961/07/09           MRN: [id number] Visit Date: 04/22/2023              Requested by: Gwenevere Abbot, MD [address] [place],  [place] [address] PCP: Gwenevere Abbot, MD  Chief Complaint  Patient presents with   Left Leg - Follow-up      HPI: Patient is a 62 year old gentleman who is status post a below-knee amputation on the left approximately 7 months ago.  Patient feels like his leg is slowly improving he is on doxycycline that he completed last week.  He is wearing his stump shrinker.  Assessment & Plan: Visit Diagnoses:  1. Left below-knee amputee (HCC)   2. Cellulitis of left lower extremity   3. Below-knee amputation of right lower extremity, initial encounter [facility])     Plan: Patient will continue with wound care continue with compression.  Possible referral to [facility] for prosthetic fitting at follow-up.  Follow-Up Instructions: Return in about 4 weeks (around 05/20/2023).   Ortho Exam  Patient is alert, oriented, no adenopathy, well-dressed, normal affect, normal respiratory effort. Examination there is no cellulitis there is no dermatitis the residual limb is consolidating there is a small amount of clear serous drainage.  The traumatic abrasions to the medial lateral aspect of the residual limb are healing nicely.  There are now very few small superficial wounds.  Imaging: No results found. No images are attached to the encounter.  Labs: Lab Results  Component Value Date   HGBA1C 6.6 (A) 03/18/2023   HGBA1C 6.9 (H) 09/24/2022   HGBA1C 7.2 (H) 03/08/2022   ESRSEDRATE 11 11/15/2021   ESRSEDRATE 25 (H) 08/19/2021   ESRSEDRATE 8 07/15/2019   CRP 0.7 11/17/2021   CRP <0.5 11/15/2021   CRP 1.7 (H) 08/19/2021   REPTSTATUS 10/03/2022 FINAL 09/28/2022   REPTSTATUS 10/03/2022 FINAL 09/28/2022   GRAMSTAIN  08/21/2021    WBC PRESENT,BOTH PMN AND MONONUCLEAR NO ORGANISMS SEEN    CULT   09/28/2022    NO GROWTH 5 DAYS Performed at [facility], [address] [place], [place] [address]    CULT  09/28/2022    NO GROWTH 5 DAYS Performed at [facility], [address], [place], [place] [address]    [facility] STAPHYLOCOCCUS HOMINIS 08/21/2021     Lab Results  Component Value Date   ALBUMIN 3.8 12/26/2022   ALBUMIN 2.8 (L) 09/28/2022   ALBUMIN 4.1 09/24/2022    Lab Results  Component Value Date   MG 1.9 11/15/2021   Lab Results  Component Value Date   VD25OH 16 (L) 11/05/2013    No results found for: "PREALBUMIN"    Latest Ref Rng & Units 10/03/2022    2:13 AM 10/01/2022    5:02 AM 09/30/2022    4:06 AM  CBC EXTENDED  WBC 4.0 - 10.5 K/uL 12.8  12.6  14.0   RBC 4.22 - 5.81 MIL/uL 4.26  4.25  3.84   Hemoglobin 13.0 - 17.0 g/dL 21.3  08.6  57.8   HCT 39.0 - 52.0 % 39.1  39.4  36.3   Platelets 150 - 400 K/uL 413  369  325      There is no height or weight on file to calculate BMI.  Orders:  No orders of the defined types were placed in this  encounter.  No orders of the defined types were placed in this encounter.    Procedures: No procedures performed  Clinical Data: No additional findings.  ROS:  All other systems negative, except as noted in the HPI. Review of Systems  Objective: Vital Signs: There were no vitals taken for this visit.  Specialty Comments:  No specialty comments available.  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) 10/03/2022   Lower extremity cellulitis 09/29/2022   Acute osteomyelitis of left foot (HCC) 09/29/2022   PFO with atrial septal aneurysm    Long term (current) use of anticoagulants 02/26/2022   Right retinal embolus 01/15/2022   DM type 2 with diabetic peripheral neuropathy (HCC) 01/15/2022   Obstructive sleep apnea 01/15/2022   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   Transportation insecurity  11/23/2021   Retinal artery occlusion, central 11/15/2021   Epidermal inclusion cyst 10/18/2021   Amputated finger 09/05/2021   Osteomyelitis of finger of right hand (HCC) 08/19/2021   Hepatic steatosis 06/24/2020   Back pain 04/20/2020   Left knee pain 04/20/2020   Allergic sinusitis 04/20/2020   Elevated liver enzymes 04/20/2020   Cutaneous abscess of left foot    Diastasis of rectus abdominis 07/16/2019   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 10/07/2018   Essential hypertension 06/19/2018   Peripheral neuropathy 12/19/2017   Left below-knee amputee (HCC) 04/17/2017   Carpal tunnel syndrome 03/18/2017   Healthcare maintenance 06/21/2015   Status post below knee amputation of right lower extremity (HCC) 06/07/2015   Diabetes mellitus with carpal tunnel syndrome (HCC) 12/09/2014   DJD (degenerative joint disease) of knee 07/21/2014   Osteoarthritis, hip, bilateral 05/25/2014   Onychomycosis 10/14/2013   Pure hypercholesterolemia 09/25/2013   Type 2 diabetes with complication (HCC) 09/15/1997   Past Medical History:  Diagnosis Date   Arthritis    bilateral hips   Cutaneous abscess of left foot    Deep vein thrombosis (DVT) (HCC)    Diabetes mellitus    type II   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 Date of diagnosis February 08 2014 Duration of anticoagulation: 6 months. End date 08/12/2014.  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 Duration of anticoagulation: 8  months. End date 10/11/2014.  Anticoagulant: Lovenox 120 units daily Switched to Eliquis on 05/25/2014 per patient preference    Pulmonary embolism (HCC)    Sebaceous cyst    on back of neck    Family History  Problem Relation Age of Onset   Breast cancer Mother    Cancer Mother        small intestine   Liver cancer Mother    Diabetes Mother    Diabetes Father    Diabetes Brother    Hypertension Maternal Grandmother    Heart Problems Maternal Grandmother    Diabetes Paternal Grandmother    Diabetes Paternal Grandfather  Diabetes Brother     Past Surgical History:  Procedure Laterality Date   AMPUTATION Right 06/03/2015   Procedure: Right Below Knee Amputation;  Surgeon: Nadara Mustard, MD;  Location: [facility] OR;  Service: Orthopedics;  Laterality: Right;   AMPUTATION Left 07/24/2019   Procedure: LEFT FOOT 3RD RAY AMPUTATION;  Surgeon: Nadara Mustard, MD;  Location: [facility] OR;  Service: Orthopedics;  Laterality: Left;   AMPUTATION Left 06/23/2021   Procedure: LEFT 2ND TOE AMPUTATION;  Surgeon: Nadara Mustard, MD;  Location: [facility] OR;  Service: Orthopedics;  Laterality: Left;   AMPUTATION Right 08/21/2021   Procedure: AMPUTATION RIGHT INDEX FINGER;  Surgeon: Gomez Cleverly, MD;  Location: [facility] OR;  Service: Orthopedics;  Laterality: Right;   AMPUTATION Left 09/29/2022   Procedure: AMPUTATION BELOW KNEE;  Surgeon: Nadara Mustard, MD;  Location: [facility] OR;  Service: Orthopedics;  Laterality: Left;   BUBBLE STUDY  01/24/2022   Procedure: BUBBLE STUDY;  Surgeon: Wendall Stade, MD;  Location: [facility] ENDOSCOPY;  Service: Cardiovascular;;   CLOSED REDUCTION WITH HUMER PIN INSERTION  1974   left hip   HARDWARE REMOVAL Left 07/21/2014   Procedure: HARDWARE REMOVAL;  Surgeon: Loreta Ave, MD;  Location: [facility] OR;  Service: Orthopedics;  Laterality: Left;   I & D EXTREMITY Right 08/21/2021   Procedure: IRRIGATION AND DEBRIDEMENT RIGHT INDEX FINGER;  Surgeon: Gomez Cleverly, MD;  Location: [facility] OR;  Service: Orthopedics;   Laterality: Right;   PATENT FORAMEN OVALE(PFO) CLOSURE N/A 02/28/2022   Procedure: PATENT FORAMEN OVALE(PFO) CLOSURE;  Surgeon: Tonny Bollman, MD;  Location: [facility] INVASIVE CV LAB;  Service: Cardiovascular;  Laterality: N/A;   ROTATOR CUFF REPAIR Right 2005 (approx)   TEE WITHOUT CARDIOVERSION N/A 01/24/2022   Procedure: TRANSESOPHAGEAL ECHOCARDIOGRAM (TEE);  Surgeon: Wendall Stade, MD;  Location: [facility] ENDOSCOPY;  Service: Cardiovascular;  Laterality: N/A;   TOTAL HIP ARTHROPLASTY Left 07/21/2014   Procedure: TOTAL HIP ARTHROPLASTY ANTERIOR APPROACH;  Surgeon: Loreta Ave, MD;  Location: [facility] OR;  Service: Orthopedics;  Laterality: Left;   TOTAL HIP ARTHROPLASTY Right 2006 (approx)   right hip replaced   Social History   Occupational History   Occupation: disabled  Tobacco Use   Smoking status: Never   Smokeless tobacco: Never  Vaping Use   Vaping status: Never Used  Substance and Sexual Activity   Alcohol use: Not Currently    Comment: beer and mixed drink maybe 5  times a month   Drug use: No   Sexual activity: Never

## 2023-04-22 NOTE — Progress Notes (Signed)
Anticoagulation Management Gregory May is a 62 y.o. male who reports to the clinic for monitoring of warfarin treatment.    Indication:  Retinal artery occlusion, central; Right retinal artery embolism, long term current use of oral anticoagulant with warfarin, INR target range 2.0 - 3.0.   Duration: indefinite Supervising physician: Debe Coder  Anticoagulation Clinic Visit History: Patient does not report signs/symptoms of bleeding or thromboembolism  Other recent changes: No diet, medications, lifestyle changes endorsed by the patient at this visit.  Anticoagulation Episode Summary     Current INR goal:  2.0-3.0  TTR:  67.1% (11.9 mo)  Next INR check:  05/20/2023  INR from last check:  2.8 (04/22/2023)  Weekly max warfarin dose:  --  Target end date:  --  INR check location:  --  Preferred lab:  --  Send INR reminders to:  --   Indications   Pulmonary emboli (HCC) (Resolved) [I26.99] DVT (deep venous thrombosis) (HCC) (Resolved) [I82.409] Retinal artery occlusion central [H34.10] Right retinal embolus [H34.9] Long term (current) use of anticoagulants [Z79.01]        Comments:  --         No Known Allergies  Current Outpatient Medications:    Accu-Chek FastClix Lancets MISC, Use Accu Chek Fastclix lancets to check blood sugar three times daily. DX:E11.65, Disp: 300 each, Rfl: 2   ACCU-CHEK GUIDE test strip, TEST BLOOD SUGAR THREE TIMES DAILY AS DIRECTED, Disp: 300 strip, Rfl: 3   acetaminophen (TYLENOL) 500 MG tablet, Take 1,000 mg by mouth as needed for mild pain or moderate pain., Disp: , Rfl:    APPLE CIDER VINEGAR PO, Take 1 tablet by mouth daily., Disp: , Rfl:    doxycycline (VIBRA-TABS) 100 MG tablet, Take 1 tablet (100 mg total) by mouth 2 (two) times daily., Disp: 28 tablet, Rfl: 0   gabapentin (NEURONTIN) 300 MG capsule, TAKE 1 CAPSULE FOUR TIMES DAILY AS NEEDED, Disp: 360 capsule, Rfl: 3   HYDROcodone-acetaminophen (NORCO/VICODIN) 5-325 MG tablet, Take  1 tablet by mouth every 6 (six) hours as needed for moderate pain., Disp: 30 tablet, Rfl: 0   insulin aspart (NOVOLOG) 100 UNIT/ML injection, Inject 0-9 Units into the skin 3 (three) times daily with meals. Correction coverage: Sensitive (thin, NPO, renal)  CBG < 70: Implement Hypoglycemia Standing Orders and refer to Hypoglycemia Standing Orders sidebar report  CBG 70 - 120: 0 units  CBG 121 - 150: 1 unit  CBG 151 - 200: 2 units  CBG 201 - 250: 3 units  CBG 251 - 300: 5 units  CBG 301 - 350: 7 units  CBG 351 - 400 9 units  CBG > 400  10 units (Patient taking differently: Inject 0-9 Units into the skin once. Correction coverage: Sensitive (thin, NPO, renal)  CBG < 70: Implement Hypoglycemia Standing Orders and refer to Hypoglycemia Standing Orders sidebar report  CBG 70 - 120: 0 units  CBG 121 - 150: 1 unit  CBG 151 - 200: 2 units  CBG 201 - 250: 3 units  CBG 251 - 300: 5 units  CBG 301 - 350: 7 units  CBG 351 - 400 9 units  CBG > 400  10 units), Disp: 10 mL, Rfl: 11   Insulin Syringes, Disposable, U-100 0.5 ML MISC, Use to inject insulin, Disp: 100 each, Rfl: 2   metFORMIN (GLUCOPHAGE-XR) 750 MG 24 hr tablet, TAKE 2 TABLETS EVERY DAY, Disp: 180 tablet, Rfl: 3   Multiple Vitamin (MULTIVITAMIN WITH MINERALS) TABS tablet,  Take 1 tablet by mouth every morning. Centrum, Disp: , Rfl:    pioglitazone (ACTOS) 30 MG tablet, TAKE 1 TABLET EVERY DAY, Disp: 90 tablet, Rfl: 3   prednisoLONE acetate (PRED FORTE) 1 % ophthalmic suspension, Place 1 drop into the right eye every evening., Disp: , Rfl:    rosuvastatin (CRESTOR) 20 MG tablet, TAKE 1 TABLET EVERY DAY, Disp: 90 tablet, Rfl: 3   warfarin (COUMADIN) 5 MG tablet, Take 2 (two) of your 5 mg peach-colored warfarin tablets on Mondays, Tuesdays,Wednesdays and Fridays. All OTHER DAYS, (Sundays, Thursdays and Saturdays) take two and one-half ( 2 & 1/2 tablets.), Disp: 64 tablet, Rfl: 3   zinc sulfate 220 (50 Zn) MG capsule, Take 1 capsule (220 mg total) by mouth daily.,  Disp: , Rfl:    ascorbic acid (VITAMIN C) 1000 MG tablet, Take 1 tablet (1,000 mg total) by mouth daily. (Patient not taking: Reported on 03/18/2023), Disp: , Rfl:    empagliflozin (JARDIANCE) 25 MG TABS tablet, Take 1 tablet (25 mg total) by mouth daily. (Patient not taking: Reported on 04/22/2023), Disp: 90 tablet, Rfl: 3   fluticasone (FLONASE) 50 MCG/ACT nasal spray, USE 2 SPRAY(S) IN EACH NOSTRIL AT BEDTIME AS NEEDED FOR  SINUS  DRAINAGE (Patient not taking: Reported on 03/18/2023), Disp: 16 g, Rfl: 0 Past Medical History:  Diagnosis Date   Arthritis    bilateral hips   Cutaneous abscess of left foot    Deep vein thrombosis (DVT) (HCC)    Diabetes mellitus    type II   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 Date of diagnosis February 08 2014 Duration of anticoagulation: 6 months. End date 08/12/2014.  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 Duration of anticoagulation: 8 months. End date 10/11/2014.  Anticoagulant: Lovenox 120 units daily Switched to Eliquis on 05/25/2014 per patient preference    Pulmonary embolism (HCC)    Sebaceous cyst    on back of neck   Social History   Socioeconomic History   Marital status: Single    Spouse name: Not on file   Number of children: 0   Years of education: Not on file   Highest education level: Not on file  Occupational History   Occupation: disabled  Tobacco Use   Smoking status: Never   Smokeless tobacco: Never  Vaping Use   Vaping status: Never Used  Substance and Sexual  Activity   Alcohol use: Not Currently    Comment: beer and mixed drink maybe 5  times a month   Drug use: No   Sexual activity: Never  Other Topics Concern   Not on file  Social History Narrative   Not on file   Social Determinants of Health   Financial Resource Strain: Low Risk  (01/16/2023)   Overall Financial Resource Strain (CARDIA)    Difficulty of Paying Living Expenses: Not hard at all  Food Insecurity: No Food Insecurity (01/16/2023)   Hunger Vital Sign    Worried About  Running Out of Food in the Last Year: Never true    Ran Out of Food in the Last Year: Never true  Transportation Needs: No Transportation Needs (01/16/2023)   PRAPARE - Administrator, Civil Service (Medical): No    Lack of Transportation (Non-Medical): No  Physical Activity: Inactive (01/16/2023)   Exercise Vital Sign    Days of Exercise per Week: 0 days    Minutes of Exercise per Session: 0 min  Stress: No Stress Concern Present (01/16/2023)   [employer] of Occupational Health - Occupational Stress Questionnaire    Feeling of Stress : Only a little  Social Connections: Socially Isolated (01/16/2023)   Social Connection and Isolation Panel [NHANES]    Frequency of Communication with Friends and Family: More than three times a week    Frequency of Social Gatherings with Friends and Family: Twice a week    Attends Religious Services: Never    Database administrator or Organizations: No    Attends Engineer, structural: Never    Marital Status: Never married   Family History  Problem Relation Age of Onset   Breast cancer Mother    Cancer Mother        small intestine   Liver cancer Mother    Diabetes Mother    Diabetes Father    Diabetes Brother    Hypertension Maternal Grandmother    Heart Problems Maternal Grandmother    Diabetes Paternal Grandmother    Diabetes Paternal Grandfather    Diabetes Brother     ASSESSMENT Recent Results: The most recent result is correlated  with 77.5 mg per week: Lab Results  Component Value Date   INR 2.8 04/22/2023   INR 2.0 04/02/2023   INR 2.5 03/04/2023    Anticoagulation Dosing:   INR today: Therapeutic  PLAN Weekly dose was unchanged. Continue to take a total weekly dose of 77.5 mg of warfarin per week.   Patient Instructions  Patient instructed to take medications as defined in the Anti-coagulation Track section of this encounter.  Patient instructed to take today's dose.  Patient instructed to take two tablets of your 5 mg peach-colored warfarin tablets on Mondays, Wednesdays and Fridays. All OTHER DAYS, take two-and-one-half (2 & 1/2) tablets of your 5 mg peach-colored warfarin tablets. Patient verbalized understanding of these instructions.  Patient advised to contact clinic or seek medical attention if signs/symptoms of bleeding or thromboembolism occur.  Patient verbalized understanding by repeating back information and was advised to contact me if further medication-related questions arise. Patient was also provided an information handout.  Follow-up Return in 4 weeks (on 05/20/2023) for Follow up INR.  Elicia Lamp, PharmD, CPP  15 minutes spent face-to-face with the patient during the encounter. 50% of time spent on education, including signs/sx bleeding and clotting, as well as food and drug interactions with warfarin. 50% of time was spent on fingerprick POC INR sample collection,processing, results determination, and documentation in [email or web address].

## 2023-04-25 DIAGNOSIS — E1142 Type 2 diabetes mellitus with diabetic polyneuropathy: Secondary | ICD-10-CM | POA: Diagnosis not present

## 2023-04-25 DIAGNOSIS — M160 Bilateral primary osteoarthritis of hip: Secondary | ICD-10-CM | POA: Diagnosis not present

## 2023-04-25 DIAGNOSIS — H3411 Central retinal artery occlusion, right eye: Secondary | ICD-10-CM | POA: Diagnosis not present

## 2023-04-25 DIAGNOSIS — Z89512 Acquired absence of left leg below knee: Secondary | ICD-10-CM | POA: Diagnosis not present

## 2023-04-25 DIAGNOSIS — G4733 Obstructive sleep apnea (adult) (pediatric): Secondary | ICD-10-CM | POA: Diagnosis not present

## 2023-04-25 DIAGNOSIS — E7800 Pure hypercholesterolemia, unspecified: Secondary | ICD-10-CM | POA: Diagnosis not present

## 2023-04-25 DIAGNOSIS — Z7901 Long term (current) use of anticoagulants: Secondary | ICD-10-CM | POA: Diagnosis not present

## 2023-04-25 DIAGNOSIS — Z4781 Encounter for orthopedic aftercare following surgical amputation: Secondary | ICD-10-CM | POA: Diagnosis not present

## 2023-04-25 DIAGNOSIS — K760 Fatty (change of) liver, not elsewhere classified: Secondary | ICD-10-CM | POA: Diagnosis not present

## 2023-04-30 DIAGNOSIS — Z4781 Encounter for orthopedic aftercare following surgical amputation: Secondary | ICD-10-CM | POA: Diagnosis not present

## 2023-04-30 DIAGNOSIS — K760 Fatty (change of) liver, not elsewhere classified: Secondary | ICD-10-CM | POA: Diagnosis not present

## 2023-04-30 DIAGNOSIS — H3411 Central retinal artery occlusion, right eye: Secondary | ICD-10-CM | POA: Diagnosis not present

## 2023-04-30 DIAGNOSIS — M160 Bilateral primary osteoarthritis of hip: Secondary | ICD-10-CM | POA: Diagnosis not present

## 2023-04-30 DIAGNOSIS — E11621 Type 2 diabetes mellitus with foot ulcer: Secondary | ICD-10-CM | POA: Diagnosis not present

## 2023-04-30 DIAGNOSIS — E7800 Pure hypercholesterolemia, unspecified: Secondary | ICD-10-CM | POA: Diagnosis not present

## 2023-04-30 DIAGNOSIS — L97821 Non-pressure chronic ulcer of other part of left lower leg limited to breakdown of skin: Secondary | ICD-10-CM | POA: Diagnosis not present

## 2023-04-30 DIAGNOSIS — E1142 Type 2 diabetes mellitus with diabetic polyneuropathy: Secondary | ICD-10-CM | POA: Diagnosis not present

## 2023-04-30 DIAGNOSIS — G4733 Obstructive sleep apnea (adult) (pediatric): Secondary | ICD-10-CM | POA: Diagnosis not present

## 2023-05-01 DIAGNOSIS — L97821 Non-pressure chronic ulcer of other part of left lower leg limited to breakdown of skin: Secondary | ICD-10-CM | POA: Diagnosis not present

## 2023-05-01 DIAGNOSIS — M160 Bilateral primary osteoarthritis of hip: Secondary | ICD-10-CM | POA: Diagnosis not present

## 2023-05-01 DIAGNOSIS — K760 Fatty (change of) liver, not elsewhere classified: Secondary | ICD-10-CM | POA: Diagnosis not present

## 2023-05-01 DIAGNOSIS — G4733 Obstructive sleep apnea (adult) (pediatric): Secondary | ICD-10-CM | POA: Diagnosis not present

## 2023-05-01 DIAGNOSIS — E11621 Type 2 diabetes mellitus with foot ulcer: Secondary | ICD-10-CM | POA: Diagnosis not present

## 2023-05-01 DIAGNOSIS — E1142 Type 2 diabetes mellitus with diabetic polyneuropathy: Secondary | ICD-10-CM | POA: Diagnosis not present

## 2023-05-01 DIAGNOSIS — H3411 Central retinal artery occlusion, right eye: Secondary | ICD-10-CM | POA: Diagnosis not present

## 2023-05-01 DIAGNOSIS — E7800 Pure hypercholesterolemia, unspecified: Secondary | ICD-10-CM | POA: Diagnosis not present

## 2023-05-01 DIAGNOSIS — Z4781 Encounter for orthopedic aftercare following surgical amputation: Secondary | ICD-10-CM | POA: Diagnosis not present

## 2023-05-07 DIAGNOSIS — M160 Bilateral primary osteoarthritis of hip: Secondary | ICD-10-CM | POA: Diagnosis not present

## 2023-05-07 DIAGNOSIS — E7800 Pure hypercholesterolemia, unspecified: Secondary | ICD-10-CM | POA: Diagnosis not present

## 2023-05-07 DIAGNOSIS — Z4781 Encounter for orthopedic aftercare following surgical amputation: Secondary | ICD-10-CM | POA: Diagnosis not present

## 2023-05-07 DIAGNOSIS — L97821 Non-pressure chronic ulcer of other part of left lower leg limited to breakdown of skin: Secondary | ICD-10-CM | POA: Diagnosis not present

## 2023-05-07 DIAGNOSIS — G4733 Obstructive sleep apnea (adult) (pediatric): Secondary | ICD-10-CM | POA: Diagnosis not present

## 2023-05-07 DIAGNOSIS — K760 Fatty (change of) liver, not elsewhere classified: Secondary | ICD-10-CM | POA: Diagnosis not present

## 2023-05-07 DIAGNOSIS — E1142 Type 2 diabetes mellitus with diabetic polyneuropathy: Secondary | ICD-10-CM | POA: Diagnosis not present

## 2023-05-07 DIAGNOSIS — H3411 Central retinal artery occlusion, right eye: Secondary | ICD-10-CM | POA: Diagnosis not present

## 2023-05-07 DIAGNOSIS — E11621 Type 2 diabetes mellitus with foot ulcer: Secondary | ICD-10-CM | POA: Diagnosis not present

## 2023-05-09 DIAGNOSIS — E1142 Type 2 diabetes mellitus with diabetic polyneuropathy: Secondary | ICD-10-CM | POA: Diagnosis not present

## 2023-05-09 DIAGNOSIS — M160 Bilateral primary osteoarthritis of hip: Secondary | ICD-10-CM | POA: Diagnosis not present

## 2023-05-09 DIAGNOSIS — H3411 Central retinal artery occlusion, right eye: Secondary | ICD-10-CM | POA: Diagnosis not present

## 2023-05-09 DIAGNOSIS — K760 Fatty (change of) liver, not elsewhere classified: Secondary | ICD-10-CM | POA: Diagnosis not present

## 2023-05-09 DIAGNOSIS — Z4781 Encounter for orthopedic aftercare following surgical amputation: Secondary | ICD-10-CM | POA: Diagnosis not present

## 2023-05-09 DIAGNOSIS — L97821 Non-pressure chronic ulcer of other part of left lower leg limited to breakdown of skin: Secondary | ICD-10-CM | POA: Diagnosis not present

## 2023-05-09 DIAGNOSIS — E11621 Type 2 diabetes mellitus with foot ulcer: Secondary | ICD-10-CM | POA: Diagnosis not present

## 2023-05-09 DIAGNOSIS — E7800 Pure hypercholesterolemia, unspecified: Secondary | ICD-10-CM | POA: Diagnosis not present

## 2023-05-09 DIAGNOSIS — G4733 Obstructive sleep apnea (adult) (pediatric): Secondary | ICD-10-CM | POA: Diagnosis not present

## 2023-05-14 DIAGNOSIS — E7800 Pure hypercholesterolemia, unspecified: Secondary | ICD-10-CM | POA: Diagnosis not present

## 2023-05-14 DIAGNOSIS — K760 Fatty (change of) liver, not elsewhere classified: Secondary | ICD-10-CM | POA: Diagnosis not present

## 2023-05-14 DIAGNOSIS — L97821 Non-pressure chronic ulcer of other part of left lower leg limited to breakdown of skin: Secondary | ICD-10-CM | POA: Diagnosis not present

## 2023-05-14 DIAGNOSIS — E1142 Type 2 diabetes mellitus with diabetic polyneuropathy: Secondary | ICD-10-CM | POA: Diagnosis not present

## 2023-05-14 DIAGNOSIS — E11621 Type 2 diabetes mellitus with foot ulcer: Secondary | ICD-10-CM | POA: Diagnosis not present

## 2023-05-14 DIAGNOSIS — M160 Bilateral primary osteoarthritis of hip: Secondary | ICD-10-CM | POA: Diagnosis not present

## 2023-05-14 DIAGNOSIS — H3411 Central retinal artery occlusion, right eye: Secondary | ICD-10-CM | POA: Diagnosis not present

## 2023-05-14 DIAGNOSIS — Z4781 Encounter for orthopedic aftercare following surgical amputation: Secondary | ICD-10-CM | POA: Diagnosis not present

## 2023-05-14 DIAGNOSIS — G4733 Obstructive sleep apnea (adult) (pediatric): Secondary | ICD-10-CM | POA: Diagnosis not present

## 2023-05-17 DIAGNOSIS — Z4781 Encounter for orthopedic aftercare following surgical amputation: Secondary | ICD-10-CM | POA: Diagnosis not present

## 2023-05-17 DIAGNOSIS — L97821 Non-pressure chronic ulcer of other part of left lower leg limited to breakdown of skin: Secondary | ICD-10-CM | POA: Diagnosis not present

## 2023-05-17 DIAGNOSIS — G4733 Obstructive sleep apnea (adult) (pediatric): Secondary | ICD-10-CM | POA: Diagnosis not present

## 2023-05-17 DIAGNOSIS — E11621 Type 2 diabetes mellitus with foot ulcer: Secondary | ICD-10-CM | POA: Diagnosis not present

## 2023-05-17 DIAGNOSIS — K760 Fatty (change of) liver, not elsewhere classified: Secondary | ICD-10-CM | POA: Diagnosis not present

## 2023-05-17 DIAGNOSIS — M160 Bilateral primary osteoarthritis of hip: Secondary | ICD-10-CM | POA: Diagnosis not present

## 2023-05-17 DIAGNOSIS — H3411 Central retinal artery occlusion, right eye: Secondary | ICD-10-CM | POA: Diagnosis not present

## 2023-05-17 DIAGNOSIS — E7800 Pure hypercholesterolemia, unspecified: Secondary | ICD-10-CM | POA: Diagnosis not present

## 2023-05-17 DIAGNOSIS — E1142 Type 2 diabetes mellitus with diabetic polyneuropathy: Secondary | ICD-10-CM | POA: Diagnosis not present

## 2023-05-20 ENCOUNTER — Ambulatory Visit: Payer: Medicare HMO | Admitting: Pharmacist

## 2023-05-20 ENCOUNTER — Ambulatory Visit: Payer: Medicare HMO | Admitting: Orthopedic Surgery

## 2023-05-20 ENCOUNTER — Encounter: Payer: Self-pay | Admitting: Orthopedic Surgery

## 2023-05-20 DIAGNOSIS — Z7901 Long term (current) use of anticoagulants: Secondary | ICD-10-CM | POA: Diagnosis not present

## 2023-05-20 DIAGNOSIS — Z89512 Acquired absence of left leg below knee: Secondary | ICD-10-CM | POA: Diagnosis not present

## 2023-05-20 DIAGNOSIS — H3410 Central retinal artery occlusion, unspecified eye: Secondary | ICD-10-CM | POA: Diagnosis not present

## 2023-05-20 DIAGNOSIS — H349 Unspecified retinal vascular occlusion: Secondary | ICD-10-CM

## 2023-05-20 LAB — POCT INR: INR: 3.3 — AB (ref 2.0–3.0)

## 2023-05-20 NOTE — Progress Notes (Signed)
Anticoagulation Management Gregory May is a 62 y.o. male who reports to the clinic for monitoring of warfarin treatment.    Indication:  Retinal artery occlusion, central; Right retinal embolism; long term current use of oral anticoagulant, warfarin. Target INR range 2.0 - 3.0.   Duration: indefinite Supervising physician:  Mercie Eon, MD  Anticoagulation Clinic Visit History: Patient does not report signs/symptoms of bleeding or thromboembolism  Other recent changes: No diet, medications, lifestyle changes cited by the patient at this visit.  Anticoagulation Episode Summary     Current INR goal:  2.0-3.0  TTR:  65.2% (1.1 y)  Next INR check:  06/17/2023  INR from last check:  3.3 (05/20/2023)  Weekly max warfarin dose:  --  Target end date:  --  INR check location:  --  Preferred lab:  --  Send INR reminders to:  --   Indications   Pulmonary emboli (HCC) (Resolved) [I26.99] DVT (deep venous thrombosis) (HCC) (Resolved) [I82.409] Retinal artery occlusion central [H34.10] Right retinal embolus [H34.9] Long term (current) use of anticoagulants [Z79.01]        Comments:  --         No Known Allergies  Current Outpatient Medications:    Accu-Chek FastClix Lancets MISC, Use Accu Chek Fastclix lancets to check blood sugar three times daily. DX:E11.65, Disp: 300 each, Rfl: 2   ACCU-CHEK GUIDE test strip, TEST BLOOD SUGAR THREE TIMES DAILY AS DIRECTED, Disp: 300 strip, Rfl: 3   acetaminophen (TYLENOL) 500 MG tablet, Take 1,000 mg by mouth as needed for mild pain or moderate pain., Disp: , Rfl:    APPLE CIDER VINEGAR PO, Take 1 tablet by mouth daily., Disp: , Rfl:    ascorbic acid (VITAMIN C) 1000 MG tablet, Take 1 tablet (1,000 mg total) by mouth daily., Disp: , Rfl:    fluticasone (FLONASE) 50 MCG/ACT nasal spray, USE 2 SPRAY(S) IN EACH NOSTRIL AT BEDTIME AS NEEDED FOR  SINUS  DRAINAGE, Disp: 16 g, Rfl: 0   gabapentin (NEURONTIN) 300 MG capsule, TAKE 1 CAPSULE FOUR  TIMES DAILY AS NEEDED, Disp: 360 capsule, Rfl: 3   insulin aspart (NOVOLOG) 100 UNIT/ML injection, Inject 0-9 Units into the skin 3 (three) times daily with meals. Correction coverage: Sensitive (thin, NPO, renal)  CBG < 70: Implement Hypoglycemia Standing Orders and refer to Hypoglycemia Standing Orders sidebar report  CBG 70 - 120: 0 units  CBG 121 - 150: 1 unit  CBG 151 - 200: 2 units  CBG 201 - 250: 3 units  CBG 251 - 300: 5 units  CBG 301 - 350: 7 units  CBG 351 - 400 9 units  CBG > 400  10 units (Patient taking differently: Inject 0-9 Units into the skin once. Correction coverage: Sensitive (thin, NPO, renal)  CBG < 70: Implement Hypoglycemia Standing Orders and refer to Hypoglycemia Standing Orders sidebar report  CBG 70 - 120: 0 units  CBG 121 - 150: 1 unit  CBG 151 - 200: 2 units  CBG 201 - 250: 3 units  CBG 251 - 300: 5 units  CBG 301 - 350: 7 units  CBG 351 - 400 9 units  CBG > 400  10 units), Disp: 10 mL, Rfl: 11   Insulin Syringes, Disposable, U-100 0.5 ML MISC, Use to inject insulin, Disp: 100 each, Rfl: 2   metFORMIN (GLUCOPHAGE-XR) 750 MG 24 hr tablet, TAKE 2 TABLETS EVERY DAY, Disp: 180 tablet, Rfl: 3   Multiple Vitamin (MULTIVITAMIN WITH MINERALS) TABS tablet,  Take 1 tablet by mouth every morning. Centrum, Disp: , Rfl:    pioglitazone (ACTOS) 30 MG tablet, TAKE 1 TABLET EVERY DAY, Disp: 90 tablet, Rfl: 3   prednisoLONE acetate (PRED FORTE) 1 % ophthalmic suspension, Place 1 drop into the right eye every evening., Disp: , Rfl:    rosuvastatin (CRESTOR) 20 MG tablet, TAKE 1 TABLET EVERY DAY, Disp: 90 tablet, Rfl: 3   warfarin (COUMADIN) 5 MG tablet, Take 2 (two) of your 5 mg peach-colored warfarin tablets on Mondays, Tuesdays,Wednesdays and Fridays. All OTHER DAYS, (Sundays, Thursdays and Saturdays) take two and one-half ( 2 & 1/2 tablets.), Disp: 64 tablet, Rfl: 3   zinc sulfate 220 (50 Zn) MG capsule, Take 1 capsule (220 mg total) by mouth daily., Disp: , Rfl:    doxycycline (VIBRA-TABS)  100 MG tablet, Take 1 tablet (100 mg total) by mouth 2 (two) times daily. (Patient not taking: Reported on 05/20/2023), Disp: 28 tablet, Rfl: 0   empagliflozin (JARDIANCE) 25 MG TABS tablet, Take 1 tablet (25 mg total) by mouth daily. (Patient not taking: Reported on 05/20/2023), Disp: 90 tablet, Rfl: 3   HYDROcodone-acetaminophen (NORCO/VICODIN) 5-325 MG tablet, Take 1 tablet by mouth every 6 (six) hours as needed for moderate pain. (Patient not taking: Reported on 05/20/2023), Disp: 30 tablet, Rfl: 0 Past Medical History:  Diagnosis Date   Arthritis    bilateral hips   Cutaneous abscess of left foot    Deep vein thrombosis (DVT) (HCC)    Diabetes mellitus    type II   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 Date of diagnosis February 08 2014 Duration of anticoagulation: 6 months. End date 08/12/2014.  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 Duration of anticoagulation: 8 months. End date 10/11/2014.  Anticoagulant: Lovenox 120 units daily Switched to Eliquis on 05/25/2014 per patient preference    Pulmonary embolism (HCC)    Sebaceous cyst    on back of neck   Social History   Socioeconomic History   Marital status: Single    Spouse name: Not on file   Number of children: 0   Years of education: Not on file   Highest education level: Not on file  Occupational History   Occupation: disabled  Tobacco Use   Smoking status: Never   Smokeless tobacco: Never  Vaping Use   Vaping status: Never Used  Substance and Sexual  Activity   Alcohol use: Not Currently    Comment: beer and mixed drink maybe 5  times a month   Drug use: No   Sexual activity: Never  Other Topics Concern   Not on file  Social History Narrative   Not on file   Social Determinants of Health   Financial Resource Strain: Low Risk  (01/16/2023)   Overall Financial Resource Strain (CARDIA)    Difficulty of Paying Living Expenses: Not hard at all  Food Insecurity: No Food Insecurity (01/16/2023)   Hunger Vital Sign    Worried About  Running Out of Food in the Last Year: Never true    Ran Out of Food in the Last Year: Never true  Transportation Needs: No Transportation Needs (01/16/2023)   PRAPARE - Administrator, Civil Service (Medical): No    Lack of Transportation (Non-Medical): No  Physical Activity: Inactive (01/16/2023)   Exercise Vital Sign    Days of Exercise per Week: 0 days    Minutes of Exercise per Session: 0 min  Stress: No Stress Concern Present (01/16/2023)   [employer] of Occupational Health - Occupational Stress Questionnaire    Feeling of Stress : Only a little  Social Connections: Socially Isolated (01/16/2023)   Social Connection and Isolation Panel [NHANES]    Frequency of Communication with Friends and Family: More than three times a week    Frequency of Social Gatherings with Friends and Family: Twice a week    Attends Religious Services: Never    Database administrator or Organizations: No    Attends Engineer, structural: Never    Marital Status: Never married   Family History  Problem Relation Age of Onset   Breast cancer Mother    Cancer Mother        small intestine   Liver cancer Mother    Diabetes Mother    Diabetes Father    Diabetes Brother    Hypertension Maternal Grandmother    Heart Problems Maternal Grandmother    Diabetes Paternal Grandmother    Diabetes Paternal Grandfather    Diabetes Brother     ASSESSMENT Recent Results: The most recent result is correlated  with 77.5 mg per week: Lab Results  Component Value Date   INR 3.3 (A) 05/20/2023   INR 2.8 04/22/2023   INR 2.0 04/02/2023    Anticoagulation Dosing:   INR today: Supratherapeutic  PLAN Weekly dose was decreased by 3% to 75 mg per week  Patient Instructions  Patient instructed to take medications as defined in the Anti-coagulation Track section of this encounter.  Patient instructed to take today's dose.  Patient instructed to take two (2) of your 5 mg peach-colored warfarin tablets on Sundays, Mondays, Wednesdays, Fridays and Saturdays. Take two-and-one-half (2 & 1/2) tablets on Tuesdays and Thursdays.  Patient verbalized understanding of these instructions.  Patient advised to contact clinic or seek medical attention if signs/symptoms of bleeding or thromboembolism occur.  Patient verbalized understanding by repeating back information and was advised to contact me if further medication-related questions arise. Patient was also provided an information handout.  Follow-up Return in 4 weeks (on 06/17/2023) for Follow up INR.  Elicia Lamp, PharmD, CPP  15 minutes spent face-to-face with the patient during the encounter. 50% of time spent on education, including signs/sx bleeding and clotting, as well as food and drug interactions with warfarin. 50% of time was spent on fingerprick POC INR sample collection,processing, results determination, and documentation in [email or web address].

## 2023-05-20 NOTE — Patient Instructions (Signed)
Patient instructed to take medications as defined in the Anti-coagulation Track section of this encounter.  Patient instructed to take today's dose.  Patient instructed to take two (2) of your 5 mg peach-colored warfarin tablets on Sundays, Mondays, Wednesdays, Fridays and Saturdays. Take two-and-one-half (2 & 1/2) tablets on Tuesdays and Thursdays.  Patient verbalized understanding of these instructions.

## 2023-05-21 ENCOUNTER — Encounter: Payer: Self-pay | Admitting: Orthopedic Surgery

## 2023-05-21 DIAGNOSIS — E7800 Pure hypercholesterolemia, unspecified: Secondary | ICD-10-CM | POA: Diagnosis not present

## 2023-05-21 DIAGNOSIS — M160 Bilateral primary osteoarthritis of hip: Secondary | ICD-10-CM | POA: Diagnosis not present

## 2023-05-21 DIAGNOSIS — H3411 Central retinal artery occlusion, right eye: Secondary | ICD-10-CM | POA: Diagnosis not present

## 2023-05-21 DIAGNOSIS — L97821 Non-pressure chronic ulcer of other part of left lower leg limited to breakdown of skin: Secondary | ICD-10-CM | POA: Diagnosis not present

## 2023-05-21 DIAGNOSIS — G4733 Obstructive sleep apnea (adult) (pediatric): Secondary | ICD-10-CM | POA: Diagnosis not present

## 2023-05-21 DIAGNOSIS — E1142 Type 2 diabetes mellitus with diabetic polyneuropathy: Secondary | ICD-10-CM | POA: Diagnosis not present

## 2023-05-21 DIAGNOSIS — E11621 Type 2 diabetes mellitus with foot ulcer: Secondary | ICD-10-CM | POA: Diagnosis not present

## 2023-05-21 DIAGNOSIS — K760 Fatty (change of) liver, not elsewhere classified: Secondary | ICD-10-CM | POA: Diagnosis not present

## 2023-05-21 DIAGNOSIS — Z4781 Encounter for orthopedic aftercare following surgical amputation: Secondary | ICD-10-CM | POA: Diagnosis not present

## 2023-05-21 NOTE — Progress Notes (Signed)
Office Visit Note   Patient: Gregory May           Date of Birth: 1960-09-28           MRN: [id number] Visit Date: 05/20/2023              Requested by: Gwenevere Abbot, MD [address] [place],  [place] [address] PCP: Gwenevere Abbot, MD  Chief Complaint  Patient presents with   Left Leg - Follow-up    Left below knee amputation 09/29/2022      HPI: Patient is a 62 year old gentleman who is 8 months status post left below-knee amputation.  Assessment & Plan: Visit Diagnoses:  1. Left below-knee amputee [facility])     Plan: Patient continues to show slow steady improvement.  He will continue with the compression sock reevaluate in 2 weeks.  Anticipate he can be fit for a prosthesis soon.  Follow-Up Instructions: Return in about 2 weeks (around 06/03/2023).   Ortho Exam  Patient is alert, oriented, no adenopathy, well-dressed, normal affect, normal respiratory effort. Examination of the superficial ulcers are almost completely healed.  He has 2 ulcers laterally that are superficial 1 cm diameter distally there is an area of abrasion that is a half a centimeter in diameter.  Medially the wounds have completely healed.  Imaging: No results found. No images are attached to the encounter.  Labs: Lab Results  Component Value Date   HGBA1C 6.6 (A) 03/18/2023   HGBA1C 6.9 (H) 09/24/2022   HGBA1C 7.2 (H) 03/08/2022   ESRSEDRATE 11 11/15/2021   ESRSEDRATE 25 (H) 08/19/2021   ESRSEDRATE 8 07/15/2019   CRP 0.7 11/17/2021   CRP <0.5 11/15/2021   CRP 1.7 (H) 08/19/2021   REPTSTATUS 10/03/2022 FINAL 09/28/2022   REPTSTATUS 10/03/2022 FINAL 09/28/2022   GRAMSTAIN  08/21/2021    WBC PRESENT,BOTH PMN AND MONONUCLEAR NO ORGANISMS SEEN    CULT  09/28/2022    NO GROWTH 5 DAYS Performed at [facility], [address], [place], [place] [address]    CULT  09/28/2022    NO GROWTH 5 DAYS Performed at [facility], [address], [place], [place] [address]     [facility] STAPHYLOCOCCUS HOMINIS 08/21/2021     Lab Results  Component Value Date   ALBUMIN 3.8 12/26/2022   ALBUMIN 2.8 (L) 09/28/2022   ALBUMIN 4.1 09/24/2022    Lab Results  Component Value Date   MG 1.9 11/15/2021   Lab Results  Component Value Date   VD25OH 16 (L) 11/05/2013    No results found for: "PREALBUMIN"    Latest Ref Rng & Units 10/03/2022    2:13 AM 10/01/2022    5:02 AM 09/30/2022    4:06 AM  CBC EXTENDED  WBC 4.0 - 10.5 K/uL 12.8  12.6  14.0   RBC 4.22 - 5.81 MIL/uL 4.26  4.25  3.84   Hemoglobin 13.0 - 17.0 g/dL 66.4  40.3  47.4   HCT 39.0 - 52.0 % 39.1  39.4  36.3   Platelets 150 - 400 K/uL 413  369  325      There is no height or weight on file to calculate BMI.  Orders:  No orders of the defined types were placed in this encounter.  No orders of the defined types were placed in this encounter.    Procedures: No procedures performed  Clinical Data: No additional findings.  ROS:  All other systems negative, except as noted in  the HPI. Review of Systems  Objective: Vital Signs: There were no vitals taken for this visit.  Specialty Comments:  No specialty comments available.  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) 10/03/2022   Lower extremity cellulitis 09/29/2022   Acute osteomyelitis of left foot (HCC) 09/29/2022   PFO with atrial septal aneurysm    Long term (current) use of anticoagulants 02/26/2022   Right retinal embolus 01/15/2022   DM type 2 with diabetic peripheral neuropathy (HCC) 01/15/2022   Obstructive sleep apnea 01/15/2022   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   Transportation insecurity 11/23/2021   Retinal artery occlusion, central 11/15/2021   Epidermal inclusion cyst 10/18/2021   Amputated finger 09/05/2021   Osteomyelitis of finger of right hand (HCC) 08/19/2021   Hepatic steatosis 06/24/2020   Back pain  04/20/2020   Left knee pain 04/20/2020   Allergic sinusitis 04/20/2020   Elevated liver enzymes 04/20/2020   Cutaneous abscess of left foot    Diastasis of rectus abdominis 07/16/2019   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 10/07/2018   Essential hypertension 06/19/2018   Peripheral neuropathy 12/19/2017   Left below-knee amputee (HCC) 04/17/2017   Carpal tunnel syndrome 03/18/2017   Healthcare maintenance 06/21/2015   Status post below knee amputation of right lower extremity (HCC) 06/07/2015   Diabetes mellitus with carpal tunnel syndrome (HCC) 12/09/2014   DJD (degenerative joint disease) of knee 07/21/2014   Osteoarthritis, hip, bilateral 05/25/2014   Onychomycosis 10/14/2013   Pure hypercholesterolemia 09/25/2013   Type 2 diabetes with complication (HCC) 09/15/1997   Past Medical History:  Diagnosis Date   Arthritis    bilateral hips   Cutaneous abscess of left foot    Deep vein thrombosis (DVT) (HCC)    Diabetes mellitus    type II   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 Date of diagnosis February 08 2014 Duration of anticoagulation: 6 months. End date 08/12/2014.  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 Duration of anticoagulation: 8 months. End date 10/11/2014.  Anticoagulant: Lovenox 120 units daily Switched to Eliquis on 05/25/2014 per patient preference    Pulmonary embolism (HCC)    Sebaceous cyst    on back of neck    Family History  Problem Relation Age  of Onset   Breast cancer Mother    Cancer Mother        small intestine   Liver cancer Mother    Diabetes Mother    Diabetes Father    Diabetes Brother    Hypertension Maternal Grandmother    Heart Problems Maternal Grandmother    Diabetes Paternal Grandmother    Diabetes Paternal Grandfather    Diabetes Brother     Past Surgical History:  Procedure Laterality Date   AMPUTATION Right 06/03/2015   Procedure: Right Below Knee Amputation;  Surgeon: Nadara Mustard, MD;  Location: [facility] OR;  Service: Orthopedics;  Laterality: Right;   AMPUTATION Left 07/24/2019   Procedure: LEFT FOOT 3RD RAY AMPUTATION;  Surgeon: Nadara Mustard, MD;  Location: [facility] OR;  Service: Orthopedics;  Laterality: Left;   AMPUTATION Left 06/23/2021   Procedure: LEFT 2ND TOE AMPUTATION;  Surgeon: Nadara Mustard, MD;  Location: [facility] OR;  Service: Orthopedics;  Laterality: Left;   AMPUTATION Right 08/21/2021   Procedure: AMPUTATION RIGHT INDEX FINGER;  Surgeon: Gomez Cleverly, MD;  Location: [facility] OR;  Service: Orthopedics;  Laterality: Right;   AMPUTATION Left 09/29/2022   Procedure: AMPUTATION BELOW KNEE;  Surgeon: Nadara Mustard, MD;  Location: [facility] OR;  Service: Orthopedics;  Laterality: Left;   BUBBLE STUDY  01/24/2022   Procedure: BUBBLE STUDY;  Surgeon: Wendall Stade, MD;  Location: [facility] ENDOSCOPY;  Service: Cardiovascular;;   CLOSED REDUCTION WITH HUMER PIN INSERTION  1974   left hip   HARDWARE REMOVAL Left 07/21/2014   Procedure: HARDWARE REMOVAL;  Surgeon: Loreta Ave, MD;  Location: [facility] OR;  Service: Orthopedics;  Laterality: Left;   I & D EXTREMITY Right 08/21/2021   Procedure: IRRIGATION AND DEBRIDEMENT RIGHT INDEX FINGER;  Surgeon: Gomez Cleverly, MD;  Location: [facility] OR;  Service: Orthopedics;  Laterality: Right;   PATENT FORAMEN OVALE(PFO) CLOSURE N/A 02/28/2022   Procedure: PATENT FORAMEN OVALE(PFO) CLOSURE;  Surgeon: Tonny Bollman, MD;  Location: [facility] INVASIVE CV LAB;  Service: Cardiovascular;  Laterality: N/A;    ROTATOR CUFF REPAIR Right 2005 (approx)   TEE WITHOUT CARDIOVERSION N/A 01/24/2022   Procedure: TRANSESOPHAGEAL ECHOCARDIOGRAM (TEE);  Surgeon: Wendall Stade, MD;  Location: [facility] ENDOSCOPY;  Service: Cardiovascular;  Laterality: N/A;   TOTAL HIP ARTHROPLASTY Left 07/21/2014   Procedure: TOTAL HIP ARTHROPLASTY ANTERIOR APPROACH;  Surgeon: Loreta Ave, MD;  Location: [facility] OR;  Service: Orthopedics;  Laterality: Left;   TOTAL HIP ARTHROPLASTY Right 2006 (approx)   right hip replaced   Social History   Occupational History   Occupation: disabled  Tobacco Use   Smoking status: Never   Smokeless tobacco: Never  Vaping Use   Vaping status: Never Used  Substance and Sexual Activity   Alcohol use: Not Currently    Comment: beer and mixed drink maybe 5  times a month   Drug use: No   Sexual activity: Never

## 2023-05-23 DIAGNOSIS — Z4781 Encounter for orthopedic aftercare following surgical amputation: Secondary | ICD-10-CM | POA: Diagnosis not present

## 2023-05-23 DIAGNOSIS — H3411 Central retinal artery occlusion, right eye: Secondary | ICD-10-CM | POA: Diagnosis not present

## 2023-05-23 DIAGNOSIS — K760 Fatty (change of) liver, not elsewhere classified: Secondary | ICD-10-CM | POA: Diagnosis not present

## 2023-05-23 DIAGNOSIS — E11621 Type 2 diabetes mellitus with foot ulcer: Secondary | ICD-10-CM | POA: Diagnosis not present

## 2023-05-23 DIAGNOSIS — E7800 Pure hypercholesterolemia, unspecified: Secondary | ICD-10-CM | POA: Diagnosis not present

## 2023-05-23 DIAGNOSIS — M160 Bilateral primary osteoarthritis of hip: Secondary | ICD-10-CM | POA: Diagnosis not present

## 2023-05-23 DIAGNOSIS — G4733 Obstructive sleep apnea (adult) (pediatric): Secondary | ICD-10-CM | POA: Diagnosis not present

## 2023-05-23 DIAGNOSIS — L97821 Non-pressure chronic ulcer of other part of left lower leg limited to breakdown of skin: Secondary | ICD-10-CM | POA: Diagnosis not present

## 2023-05-23 DIAGNOSIS — E1142 Type 2 diabetes mellitus with diabetic polyneuropathy: Secondary | ICD-10-CM | POA: Diagnosis not present

## 2023-05-27 DIAGNOSIS — Z4781 Encounter for orthopedic aftercare following surgical amputation: Secondary | ICD-10-CM | POA: Diagnosis not present

## 2023-05-27 DIAGNOSIS — K760 Fatty (change of) liver, not elsewhere classified: Secondary | ICD-10-CM | POA: Diagnosis not present

## 2023-05-27 DIAGNOSIS — G4733 Obstructive sleep apnea (adult) (pediatric): Secondary | ICD-10-CM | POA: Diagnosis not present

## 2023-05-27 DIAGNOSIS — M160 Bilateral primary osteoarthritis of hip: Secondary | ICD-10-CM | POA: Diagnosis not present

## 2023-05-27 DIAGNOSIS — E11621 Type 2 diabetes mellitus with foot ulcer: Secondary | ICD-10-CM | POA: Diagnosis not present

## 2023-05-27 DIAGNOSIS — H3411 Central retinal artery occlusion, right eye: Secondary | ICD-10-CM | POA: Diagnosis not present

## 2023-05-27 DIAGNOSIS — L97821 Non-pressure chronic ulcer of other part of left lower leg limited to breakdown of skin: Secondary | ICD-10-CM | POA: Diagnosis not present

## 2023-05-27 DIAGNOSIS — E7800 Pure hypercholesterolemia, unspecified: Secondary | ICD-10-CM | POA: Diagnosis not present

## 2023-05-27 DIAGNOSIS — E1142 Type 2 diabetes mellitus with diabetic polyneuropathy: Secondary | ICD-10-CM | POA: Diagnosis not present

## 2023-06-03 DIAGNOSIS — E7800 Pure hypercholesterolemia, unspecified: Secondary | ICD-10-CM | POA: Diagnosis not present

## 2023-06-03 DIAGNOSIS — G4733 Obstructive sleep apnea (adult) (pediatric): Secondary | ICD-10-CM | POA: Diagnosis not present

## 2023-06-03 DIAGNOSIS — Z4781 Encounter for orthopedic aftercare following surgical amputation: Secondary | ICD-10-CM | POA: Diagnosis not present

## 2023-06-03 DIAGNOSIS — M160 Bilateral primary osteoarthritis of hip: Secondary | ICD-10-CM | POA: Diagnosis not present

## 2023-06-03 DIAGNOSIS — K760 Fatty (change of) liver, not elsewhere classified: Secondary | ICD-10-CM | POA: Diagnosis not present

## 2023-06-03 DIAGNOSIS — E11621 Type 2 diabetes mellitus with foot ulcer: Secondary | ICD-10-CM | POA: Diagnosis not present

## 2023-06-03 DIAGNOSIS — H3411 Central retinal artery occlusion, right eye: Secondary | ICD-10-CM | POA: Diagnosis not present

## 2023-06-03 DIAGNOSIS — E1142 Type 2 diabetes mellitus with diabetic polyneuropathy: Secondary | ICD-10-CM | POA: Diagnosis not present

## 2023-06-03 DIAGNOSIS — L97821 Non-pressure chronic ulcer of other part of left lower leg limited to breakdown of skin: Secondary | ICD-10-CM | POA: Diagnosis not present

## 2023-06-07 DIAGNOSIS — M160 Bilateral primary osteoarthritis of hip: Secondary | ICD-10-CM | POA: Diagnosis not present

## 2023-06-07 DIAGNOSIS — E1142 Type 2 diabetes mellitus with diabetic polyneuropathy: Secondary | ICD-10-CM | POA: Diagnosis not present

## 2023-06-07 DIAGNOSIS — E11621 Type 2 diabetes mellitus with foot ulcer: Secondary | ICD-10-CM | POA: Diagnosis not present

## 2023-06-07 DIAGNOSIS — K760 Fatty (change of) liver, not elsewhere classified: Secondary | ICD-10-CM | POA: Diagnosis not present

## 2023-06-07 DIAGNOSIS — E7800 Pure hypercholesterolemia, unspecified: Secondary | ICD-10-CM | POA: Diagnosis not present

## 2023-06-07 DIAGNOSIS — H3411 Central retinal artery occlusion, right eye: Secondary | ICD-10-CM | POA: Diagnosis not present

## 2023-06-07 DIAGNOSIS — G4733 Obstructive sleep apnea (adult) (pediatric): Secondary | ICD-10-CM | POA: Diagnosis not present

## 2023-06-07 DIAGNOSIS — L97821 Non-pressure chronic ulcer of other part of left lower leg limited to breakdown of skin: Secondary | ICD-10-CM | POA: Diagnosis not present

## 2023-06-07 DIAGNOSIS — Z4781 Encounter for orthopedic aftercare following surgical amputation: Secondary | ICD-10-CM | POA: Diagnosis not present

## 2023-06-14 ENCOUNTER — Other Ambulatory Visit: Payer: Self-pay | Admitting: Pharmacist

## 2023-06-14 DIAGNOSIS — E7800 Pure hypercholesterolemia, unspecified: Secondary | ICD-10-CM | POA: Diagnosis not present

## 2023-06-14 DIAGNOSIS — E1142 Type 2 diabetes mellitus with diabetic polyneuropathy: Secondary | ICD-10-CM | POA: Diagnosis not present

## 2023-06-14 DIAGNOSIS — H3411 Central retinal artery occlusion, right eye: Secondary | ICD-10-CM | POA: Diagnosis not present

## 2023-06-14 DIAGNOSIS — G4733 Obstructive sleep apnea (adult) (pediatric): Secondary | ICD-10-CM | POA: Diagnosis not present

## 2023-06-14 DIAGNOSIS — M160 Bilateral primary osteoarthritis of hip: Secondary | ICD-10-CM | POA: Diagnosis not present

## 2023-06-14 DIAGNOSIS — E11621 Type 2 diabetes mellitus with foot ulcer: Secondary | ICD-10-CM | POA: Diagnosis not present

## 2023-06-14 DIAGNOSIS — L97821 Non-pressure chronic ulcer of other part of left lower leg limited to breakdown of skin: Secondary | ICD-10-CM | POA: Diagnosis not present

## 2023-06-14 DIAGNOSIS — K760 Fatty (change of) liver, not elsewhere classified: Secondary | ICD-10-CM | POA: Diagnosis not present

## 2023-06-14 DIAGNOSIS — Z4781 Encounter for orthopedic aftercare following surgical amputation: Secondary | ICD-10-CM | POA: Diagnosis not present

## 2023-06-14 MED ORDER — WARFARIN SODIUM 5 MG PO TABS: ORAL_TABLET | ORAL | 3 refills | Status: DC

## 2023-06-17 ENCOUNTER — Ambulatory Visit (INDEPENDENT_AMBULATORY_CARE_PROVIDER_SITE_OTHER): Payer: Medicare HMO | Admitting: Orthopedic Surgery

## 2023-06-17 ENCOUNTER — Encounter: Payer: Self-pay | Admitting: Orthopedic Surgery

## 2023-06-17 ENCOUNTER — Ambulatory Visit: Payer: Medicare HMO | Admitting: Pharmacist

## 2023-06-17 DIAGNOSIS — Z89512 Acquired absence of left leg below knee: Secondary | ICD-10-CM

## 2023-06-17 DIAGNOSIS — Z8673 Personal history of transient ischemic attack (TIA), and cerebral infarction without residual deficits: Secondary | ICD-10-CM

## 2023-06-17 DIAGNOSIS — H3411 Central retinal artery occlusion, right eye: Secondary | ICD-10-CM

## 2023-06-17 DIAGNOSIS — I63412 Cerebral infarction due to embolism of left middle cerebral artery: Secondary | ICD-10-CM | POA: Diagnosis not present

## 2023-06-17 DIAGNOSIS — Z7901 Long term (current) use of anticoagulants: Secondary | ICD-10-CM

## 2023-06-17 DIAGNOSIS — H349 Unspecified retinal vascular occlusion: Secondary | ICD-10-CM

## 2023-06-17 LAB — POCT INR: INR: 2.5 (ref 2.0–3.0)

## 2023-06-17 NOTE — Progress Notes (Signed)
Anticoagulation Management Gregory May is a 62 y.o. male who reports to the clinic for monitoring of warfarin treatment.    Indication: Retinal artery occlusion, central; Right retinal embolus;  Long term (current) use of anticoagulants (warfarin) to maintain INR range 2.0 - 3.0.   Duration: indefinite Supervising physician: Debe Coder  Anticoagulation Clinic Visit History: Patient does not report signs/symptoms of bleeding or thromboembolism  Other recent changes: No diet, medications, lifestyle changes.  Anticoagulation Episode Summary     Current INR goal:  2.0-3.0  TTR:  65.0% (1.1 y)  Next INR check:  07/15/2023  INR from last check:  2.5 (06/17/2023)  Weekly max warfarin dose:  --  Target end date:  --  INR check location:  --  Preferred lab:  --  Send INR reminders to:  --   Indications   Pulmonary emboli (HCC) (Resolved) [I26.99] DVT (deep venous thrombosis) (HCC) (Resolved) [I82.409] Retinal artery occlusion central [H34.10] Right retinal embolus [H34.9] Long term (current) use of anticoagulants [Z79.01]        Comments:  --         No Known Allergies  Current Outpatient Medications:    Accu-Chek FastClix Lancets MISC, Use Accu Chek Fastclix lancets to check blood sugar three times daily. DX:E11.65, Disp: 300 each, Rfl: 2   ACCU-CHEK GUIDE test strip, TEST BLOOD SUGAR THREE TIMES DAILY AS DIRECTED, Disp: 300 strip, Rfl: 3   acetaminophen (TYLENOL) 500 MG tablet, Take 1,000 mg by mouth as needed for mild pain or moderate pain., Disp: , Rfl:    APPLE CIDER VINEGAR PO, Take 1 tablet by mouth daily., Disp: , Rfl:    ascorbic acid (VITAMIN C) 1000 MG tablet, Take 1 tablet (1,000 mg total) by mouth daily., Disp: , Rfl:    fluticasone (FLONASE) 50 MCG/ACT nasal spray, USE 2 SPRAY(S) IN EACH NOSTRIL AT BEDTIME AS NEEDED FOR  SINUS  DRAINAGE, Disp: 16 g, Rfl: 0   gabapentin (NEURONTIN) 300 MG capsule, TAKE 1 CAPSULE FOUR TIMES DAILY AS NEEDED, Disp: 360  capsule, Rfl: 3   insulin aspart (NOVOLOG) 100 UNIT/ML injection, Inject 0-9 Units into the skin 3 (three) times daily with meals. Correction coverage: Sensitive (thin, NPO, renal)  CBG < 70: Implement Hypoglycemia Standing Orders and refer to Hypoglycemia Standing Orders sidebar report  CBG 70 - 120: 0 units  CBG 121 - 150: 1 unit  CBG 151 - 200: 2 units  CBG 201 - 250: 3 units  CBG 251 - 300: 5 units  CBG 301 - 350: 7 units  CBG 351 - 400 9 units  CBG > 400  10 units (Patient taking differently: Inject 0-9 Units into the skin once. Correction coverage: Sensitive (thin, NPO, renal)  CBG < 70: Implement Hypoglycemia Standing Orders and refer to Hypoglycemia Standing Orders sidebar report  CBG 70 - 120: 0 units  CBG 121 - 150: 1 unit  CBG 151 - 200: 2 units  CBG 201 - 250: 3 units  CBG 251 - 300: 5 units  CBG 301 - 350: 7 units  CBG 351 - 400 9 units  CBG > 400  10 units), Disp: 10 mL, Rfl: 11   Insulin Syringes, Disposable, U-100 0.5 ML MISC, Use to inject insulin, Disp: 100 each, Rfl: 2   metFORMIN (GLUCOPHAGE-XR) 750 MG 24 hr tablet, TAKE 2 TABLETS EVERY DAY, Disp: 180 tablet, Rfl: 3   Multiple Vitamin (MULTIVITAMIN WITH MINERALS) TABS tablet, Take 1 tablet by mouth every morning. Centrum, Disp: ,  Rfl:    pioglitazone (ACTOS) 30 MG tablet, TAKE 1 TABLET EVERY DAY, Disp: 90 tablet, Rfl: 3   prednisoLONE acetate (PRED FORTE) 1 % ophthalmic suspension, Place 1 drop into the right eye every evening., Disp: , Rfl:    rosuvastatin (CRESTOR) 20 MG tablet, TAKE 1 TABLET EVERY DAY, Disp: 90 tablet, Rfl: 3   warfarin (COUMADIN) 5 MG tablet, Take 2 (two) of your 5 mg peach-colored warfarin tablets on Sundays, Mondays, Wednesdays, Fridays and Saturdays. All OTHER DAYS, (Tuesdays and Thursdays), take two and one-half ( 2 & 1/2 tablets.), Disp: 60 tablet, Rfl: 3   zinc sulfate 220 (50 Zn) MG capsule, Take 1 capsule (220 mg total) by mouth daily., Disp: , Rfl:    doxycycline (VIBRA-TABS) 100 MG tablet, Take 1 tablet  (100 mg total) by mouth 2 (two) times daily. (Patient not taking: Reported on 05/20/2023), Disp: 28 tablet, Rfl: 0   empagliflozin (JARDIANCE) 25 MG TABS tablet, Take 1 tablet (25 mg total) by mouth daily. (Patient not taking: Reported on 05/20/2023), Disp: 90 tablet, Rfl: 3   HYDROcodone-acetaminophen (NORCO/VICODIN) 5-325 MG tablet, Take 1 tablet by mouth every 6 (six) hours as needed for moderate pain. (Patient not taking: Reported on 05/20/2023), Disp: 30 tablet, Rfl: 0 Past Medical History:  Diagnosis Date   Arthritis    bilateral hips   Cutaneous abscess of left foot    Deep vein thrombosis (DVT) (HCC)    Diabetes mellitus    type II   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 Date of diagnosis February 08 2014 Duration of anticoagulation: 6 months. End date 08/12/2014.  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 Duration of anticoagulation: 8 months. End date 10/11/2014.  Anticoagulant: Lovenox 120 units daily Switched to Eliquis on 05/25/2014 per patient preference    Pulmonary embolism (HCC)    Sebaceous cyst    on back of neck   Social History   Socioeconomic History   Marital status: Single    Spouse name: Not on file   Number of children: 0   Years of education: Not on file   Highest education level: Not on file  Occupational History   Occupation: disabled  Tobacco Use   Smoking status: Never   Smokeless tobacco: Never  Vaping Use   Vaping status: Never Used  Substance and Sexual Activity   Alcohol use: Not  Currently    Comment: beer and mixed drink maybe 5  times a month   Drug use: No   Sexual activity: Never  Other Topics Concern   Not on file  Social History Narrative   Not on file   Social Determinants of Health   Financial Resource Strain: Low Risk  (01/16/2023)   Overall Financial Resource Strain (CARDIA)    Difficulty of Paying Living Expenses: Not hard at all  Food Insecurity: No Food Insecurity (01/16/2023)   Hunger Vital Sign    Worried About Running Out of Food in the Last Year: Never  true    Ran Out of Food in the Last Year: Never true  Transportation Needs: No Transportation Needs (01/16/2023)   PRAPARE - Administrator, Civil Service (Medical): No    Lack of Transportation (Non-Medical): No  Physical Activity: Inactive (01/16/2023)   Exercise Vital Sign    Days of Exercise per Week: 0 days    Minutes of Exercise per Session: 0 min  Stress: No Stress Concern Present (01/16/2023)   [employer] of Occupational Health - Occupational Stress Questionnaire    Feeling of Stress : Only a little  Social Connections: Socially Isolated (01/16/2023)   Social Connection and Isolation Panel [NHANES]    Frequency of Communication with Friends and Family: More than three times a week    Frequency of Social Gatherings with Friends and Family: Twice a week    Attends Religious Services: Never    Database administrator or Organizations: No    Attends Engineer, structural: Never    Marital Status: Never married   Family History  Problem Relation Age of Onset   Breast cancer Mother    Cancer Mother        small intestine   Liver cancer Mother    Diabetes Mother    Diabetes Father    Diabetes Brother    Hypertension Maternal Grandmother    Heart Problems Maternal Grandmother    Diabetes Paternal Grandmother    Diabetes Paternal Grandfather    Diabetes Brother     ASSESSMENT Recent Results: The most recent result is correlated with 75 mg per week: Lab  Results  Component Value Date   INR 2.5 06/17/2023   INR 3.3 (A) 05/20/2023   INR 2.8 04/22/2023    Anticoagulation Dosing:   INR today: Therapeutic  PLAN Weekly dose was unchanged.   Patient Instructions  Patient instructed to take medications as defined in the Anti-coagulation Track section of this encounter.  Patient instructed to take today's dose.  Patient instructed to take two (2) of your 5 mg peach colored warfarin tablets on Sundays, Mondays, Wednesdays,Fridays and Saturdays. On TUESDAYS and THURSDAYS, take two-and-one-half (2 & 1/2) tablets.  Patient verbalized understanding of these instructions.  Patient advised to contact clinic or seek medical attention if signs/symptoms of bleeding or thromboembolism occur.  Patient verbalized understanding by repeating back information and was advised to contact me if further medication-related questions arise. Patient was also provided an information handout.  Follow-up Return in 4 weeks (on 07/15/2023) for Follow up INR.  Elicia Lamp, PharmD, CPP  15 minutes spent face-to-face with the patient during the encounter. 50% of time spent on education, including signs/sx bleeding and clotting, as well as food and drug interactions with warfarin. 50% of time was spent on fingerprick POC INR sample collection,processing, results determination, and documentation in [email or web address].

## 2023-06-17 NOTE — Patient Instructions (Signed)
Patient instructed to take medications as defined in the Anti-coagulation Track section of this encounter.  Patient instructed to take today's dose.  Patient instructed to take two (2) of your 5 mg peach colored warfarin tablets on Sundays, Mondays, Wednesdays,Fridays and Saturdays. On TUESDAYS and THURSDAYS, take two-and-one-half (2 & 1/2) tablets.  Patient verbalized understanding of these instructions.

## 2023-06-17 NOTE — Progress Notes (Signed)
Office Visit Note   Patient: Gregory May           Date of Birth: 05-11-1961           MRN: [id number] Visit Date: 06/17/2023              Requested by: Gwenevere Abbot, MD [address] [place],  [place] [address] PCP: Gwenevere Abbot, MD  Chief Complaint  Patient presents with   Left Leg - Follow-up    Left below knee amputation 09/29/2022      HPI: Patient is a 62 year old gentleman status post left below-knee amputation.  He is currently wearing compression sock he states his leg is looking better.  Assessment & Plan: Visit Diagnoses:  1. Left below-knee amputee [facility])     Plan: Continue with compression.  Reevaluate in 4 weeks at which time anticipate he could be fit for prosthesis.  Follow-Up Instructions: Return in about 4 weeks (around 07/15/2023).   Ortho Exam  Patient is alert, oriented, no adenopathy, well-dressed, normal affect, normal respiratory effort. Examination patient has 6 small areas of hypergranulation tissue on the residual limb.  These were touched with silver nitrate.  The macerated skin is resolving nicely his residual limb is well consolidated.  Patient will wear a 4 x 4 plus Ace wrap and his shrinker.  Imaging: No results found. No images are attached to the encounter.  Labs: Lab Results  Component Value Date   HGBA1C 6.6 (A) 03/18/2023   HGBA1C 6.9 (H) 09/24/2022   HGBA1C 7.2 (H) 03/08/2022   ESRSEDRATE 11 11/15/2021   ESRSEDRATE 25 (H) 08/19/2021   ESRSEDRATE 8 07/15/2019   CRP 0.7 11/17/2021   CRP <0.5 11/15/2021   CRP 1.7 (H) 08/19/2021   REPTSTATUS 10/03/2022 FINAL 09/28/2022   REPTSTATUS 10/03/2022 FINAL 09/28/2022   GRAMSTAIN  08/21/2021    WBC PRESENT,BOTH PMN AND MONONUCLEAR NO ORGANISMS SEEN    CULT  09/28/2022    NO GROWTH 5 DAYS Performed at [facility], [address], [place], [place] [address]    CULT  09/28/2022    NO GROWTH 5 DAYS Performed at [facility], [address] [place],  [place] [address]    [facility] STAPHYLOCOCCUS HOMINIS 08/21/2021     Lab Results  Component Value Date   ALBUMIN 3.8 12/26/2022   ALBUMIN 2.8 (L) 09/28/2022   ALBUMIN 4.1 09/24/2022    Lab Results  Component Value Date   MG 1.9 11/15/2021   Lab Results  Component Value Date   VD25OH 16 (L) 11/05/2013    No results found for: "PREALBUMIN"    Latest Ref Rng & Units 10/03/2022    2:13 AM 10/01/2022    5:02 AM 09/30/2022    4:06 AM  CBC EXTENDED  WBC 4.0 - 10.5 K/uL 12.8  12.6  14.0   RBC 4.22 - 5.81 MIL/uL 4.26  4.25  3.84   Hemoglobin 13.0 - 17.0 g/dL 21.3  08.6  57.8   HCT 39.0 - 52.0 % 39.1  39.4  36.3   Platelets 150 - 400 K/uL 413  369  325      There is no height or weight on file to calculate BMI.  Orders:  No orders of the defined types were placed in this encounter.  No orders of the defined types were placed in this encounter.    Procedures: No procedures performed  Clinical Data: No additional findings.  ROS:  All other systems negative, except  as noted in the HPI. Review of Systems  Objective: Vital Signs: There were no vitals taken for this visit.  Specialty Comments:  No specialty comments available.  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) 10/03/2022   Lower extremity cellulitis 09/29/2022   Acute osteomyelitis of left foot (HCC) 09/29/2022   PFO with atrial septal aneurysm    Long term (current) use of anticoagulants 02/26/2022   Right retinal embolus 01/15/2022   DM type 2 with diabetic peripheral neuropathy (HCC) 01/15/2022   Obstructive sleep apnea 01/15/2022   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   Transportation insecurity 11/23/2021   Retinal artery occlusion, central 11/15/2021   Epidermal inclusion cyst 10/18/2021   Amputated finger 09/05/2021   Osteomyelitis of finger of right hand (HCC) 08/19/2021   Hepatic steatosis 06/24/2020    Back pain 04/20/2020   Left knee pain 04/20/2020   Allergic sinusitis 04/20/2020   Elevated liver enzymes 04/20/2020   Cutaneous abscess of left foot    Diastasis of rectus abdominis 07/16/2019   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 10/07/2018   Essential hypertension 06/19/2018   Peripheral neuropathy 12/19/2017   Left below-knee amputee (HCC) 04/17/2017   Carpal tunnel syndrome 03/18/2017   Healthcare maintenance 06/21/2015   Status post below knee amputation of right lower extremity (HCC) 06/07/2015   Diabetes mellitus with carpal tunnel syndrome (HCC) 12/09/2014   DJD (degenerative joint disease) of knee 07/21/2014   Osteoarthritis, hip, bilateral 05/25/2014   Onychomycosis 10/14/2013   Pure hypercholesterolemia 09/25/2013   Type 2 diabetes with complication (HCC) 09/15/1997   Past Medical History:  Diagnosis Date   Arthritis    bilateral hips   Cutaneous abscess of left foot    Deep vein thrombosis (DVT) (HCC)    Diabetes mellitus    type II   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 Date of diagnosis February 08 2014 Duration of anticoagulation: 6 months. End date 08/12/2014.  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 Duration of anticoagulation: 8 months. End date 10/11/2014.  Anticoagulant: Lovenox 120 units daily Switched to Eliquis on 05/25/2014 per patient preference    Pulmonary embolism (HCC)    Sebaceous cyst    on back of neck    Family History  Problem  Relation Age of Onset   Breast cancer Mother    Cancer Mother        small intestine   Liver cancer Mother    Diabetes Mother    Diabetes Father    Diabetes Brother    Hypertension Maternal Grandmother    Heart Problems Maternal Grandmother    Diabetes Paternal Grandmother    Diabetes Paternal Grandfather    Diabetes Brother     Past Surgical History:  Procedure Laterality Date   AMPUTATION Right 06/03/2015   Procedure: Right Below Knee Amputation;  Surgeon: Nadara Mustard, MD;  Location: [facility] OR;  Service: Orthopedics;  Laterality: Right;   AMPUTATION Left 07/24/2019   Procedure: LEFT FOOT 3RD RAY AMPUTATION;  Surgeon: Nadara Mustard, MD;  Location: [facility] OR;  Service: Orthopedics;  Laterality: Left;   AMPUTATION Left 06/23/2021   Procedure: LEFT 2ND TOE AMPUTATION;  Surgeon: Nadara Mustard, MD;  Location: [facility] OR;  Service: Orthopedics;  Laterality: Left;   AMPUTATION Right 08/21/2021   Procedure: AMPUTATION RIGHT INDEX FINGER;  Surgeon: Gomez Cleverly, MD;  Location: [facility] OR;  Service: Orthopedics;  Laterality: Right;   AMPUTATION Left 09/29/2022   Procedure: AMPUTATION BELOW KNEE;  Surgeon: Nadara Mustard, MD;  Location: [facility] OR;  Service: Orthopedics;  Laterality: Left;   BUBBLE STUDY  01/24/2022   Procedure: BUBBLE STUDY;  Surgeon: Wendall Stade, MD;  Location: [facility] ENDOSCOPY;  Service: Cardiovascular;;   CLOSED REDUCTION WITH HUMER PIN INSERTION  1974   left hip   HARDWARE REMOVAL Left 07/21/2014   Procedure: HARDWARE REMOVAL;  Surgeon: Loreta Ave, MD;  Location: [facility] OR;  Service: Orthopedics;  Laterality: Left;   I & D EXTREMITY Right 08/21/2021   Procedure: IRRIGATION AND DEBRIDEMENT RIGHT INDEX FINGER;  Surgeon: Gomez Cleverly, MD;  Location: [facility] OR;  Service: Orthopedics;  Laterality: Right;   PATENT FORAMEN OVALE(PFO) CLOSURE N/A 02/28/2022   Procedure: PATENT FORAMEN OVALE(PFO) CLOSURE;  Surgeon: Tonny Bollman, MD;  Location: [facility] INVASIVE CV LAB;  Service: Cardiovascular;  Laterality:  N/A;   ROTATOR CUFF REPAIR Right 2005 (approx)   TEE WITHOUT CARDIOVERSION N/A 01/24/2022   Procedure: TRANSESOPHAGEAL ECHOCARDIOGRAM (TEE);  Surgeon: Wendall Stade, MD;  Location: [facility] ENDOSCOPY;  Service: Cardiovascular;  Laterality: N/A;   TOTAL HIP ARTHROPLASTY Left 07/21/2014   Procedure: TOTAL HIP ARTHROPLASTY ANTERIOR APPROACH;  Surgeon: Loreta Ave, MD;  Location: [facility] OR;  Service: Orthopedics;  Laterality: Left;   TOTAL HIP ARTHROPLASTY Right 2006 (approx)   right hip replaced   Social History   Occupational History   Occupation: disabled  Tobacco Use   Smoking status: Never   Smokeless tobacco: Never  Vaping Use   Vaping status: Never Used  Substance and Sexual Activity   Alcohol use: Not Currently    Comment: beer and mixed drink maybe 5  times a month   Drug use: No   Sexual activity: Never

## 2023-06-17 NOTE — Progress Notes (Signed)
Evaluation and management procedures were performed by the Clinical Pharmacy Practitioner under my supervision and collaboration. I have reviewed the Practitioner's note and chart, and I agree with the management and plan as documented above. ° °

## 2023-06-21 DIAGNOSIS — E7800 Pure hypercholesterolemia, unspecified: Secondary | ICD-10-CM | POA: Diagnosis not present

## 2023-06-21 DIAGNOSIS — E11621 Type 2 diabetes mellitus with foot ulcer: Secondary | ICD-10-CM | POA: Diagnosis not present

## 2023-06-21 DIAGNOSIS — G4733 Obstructive sleep apnea (adult) (pediatric): Secondary | ICD-10-CM | POA: Diagnosis not present

## 2023-06-21 DIAGNOSIS — L97821 Non-pressure chronic ulcer of other part of left lower leg limited to breakdown of skin: Secondary | ICD-10-CM | POA: Diagnosis not present

## 2023-06-21 DIAGNOSIS — K760 Fatty (change of) liver, not elsewhere classified: Secondary | ICD-10-CM | POA: Diagnosis not present

## 2023-06-21 DIAGNOSIS — Z4781 Encounter for orthopedic aftercare following surgical amputation: Secondary | ICD-10-CM | POA: Diagnosis not present

## 2023-06-21 DIAGNOSIS — H3411 Central retinal artery occlusion, right eye: Secondary | ICD-10-CM | POA: Diagnosis not present

## 2023-06-21 DIAGNOSIS — E1142 Type 2 diabetes mellitus with diabetic polyneuropathy: Secondary | ICD-10-CM | POA: Diagnosis not present

## 2023-06-21 DIAGNOSIS — M160 Bilateral primary osteoarthritis of hip: Secondary | ICD-10-CM | POA: Diagnosis not present

## 2023-06-24 DIAGNOSIS — E7800 Pure hypercholesterolemia, unspecified: Secondary | ICD-10-CM | POA: Diagnosis not present

## 2023-06-24 DIAGNOSIS — H3411 Central retinal artery occlusion, right eye: Secondary | ICD-10-CM | POA: Diagnosis not present

## 2023-06-24 DIAGNOSIS — K760 Fatty (change of) liver, not elsewhere classified: Secondary | ICD-10-CM | POA: Diagnosis not present

## 2023-06-24 DIAGNOSIS — G4733 Obstructive sleep apnea (adult) (pediatric): Secondary | ICD-10-CM | POA: Diagnosis not present

## 2023-06-24 DIAGNOSIS — E1142 Type 2 diabetes mellitus with diabetic polyneuropathy: Secondary | ICD-10-CM | POA: Diagnosis not present

## 2023-06-24 DIAGNOSIS — Z4781 Encounter for orthopedic aftercare following surgical amputation: Secondary | ICD-10-CM | POA: Diagnosis not present

## 2023-06-24 DIAGNOSIS — M160 Bilateral primary osteoarthritis of hip: Secondary | ICD-10-CM | POA: Diagnosis not present

## 2023-06-24 DIAGNOSIS — L97821 Non-pressure chronic ulcer of other part of left lower leg limited to breakdown of skin: Secondary | ICD-10-CM | POA: Diagnosis not present

## 2023-06-24 DIAGNOSIS — E11621 Type 2 diabetes mellitus with foot ulcer: Secondary | ICD-10-CM | POA: Diagnosis not present

## 2023-06-28 ENCOUNTER — Other Ambulatory Visit: Payer: Self-pay

## 2023-06-28 DIAGNOSIS — E119 Type 2 diabetes mellitus without complications: Secondary | ICD-10-CM

## 2023-06-28 DIAGNOSIS — E782 Mixed hyperlipidemia: Secondary | ICD-10-CM

## 2023-07-02 ENCOUNTER — Other Ambulatory Visit: Payer: Medicare HMO

## 2023-07-02 ENCOUNTER — Telehealth: Payer: Self-pay

## 2023-07-02 ENCOUNTER — Other Ambulatory Visit (INDEPENDENT_AMBULATORY_CARE_PROVIDER_SITE_OTHER): Payer: Medicare HMO

## 2023-07-02 DIAGNOSIS — E119 Type 2 diabetes mellitus without complications: Secondary | ICD-10-CM | POA: Diagnosis not present

## 2023-07-02 DIAGNOSIS — Z794 Long term (current) use of insulin: Secondary | ICD-10-CM | POA: Diagnosis not present

## 2023-07-02 DIAGNOSIS — E782 Mixed hyperlipidemia: Secondary | ICD-10-CM | POA: Diagnosis not present

## 2023-07-02 NOTE — Patient Outreach (Signed)
Attempted to contact patient regarding DM eye, colorectal exam. Left voicemail for patient to return my call at [phone].  Nicholes Rough, CMA Care Guide [employer]

## 2023-07-03 DIAGNOSIS — K760 Fatty (change of) liver, not elsewhere classified: Secondary | ICD-10-CM | POA: Diagnosis not present

## 2023-07-03 DIAGNOSIS — E1142 Type 2 diabetes mellitus with diabetic polyneuropathy: Secondary | ICD-10-CM | POA: Diagnosis not present

## 2023-07-03 DIAGNOSIS — H3411 Central retinal artery occlusion, right eye: Secondary | ICD-10-CM | POA: Diagnosis not present

## 2023-07-03 DIAGNOSIS — G4733 Obstructive sleep apnea (adult) (pediatric): Secondary | ICD-10-CM | POA: Diagnosis not present

## 2023-07-03 DIAGNOSIS — E7800 Pure hypercholesterolemia, unspecified: Secondary | ICD-10-CM | POA: Diagnosis not present

## 2023-07-03 DIAGNOSIS — S81802D Unspecified open wound, left lower leg, subsequent encounter: Secondary | ICD-10-CM | POA: Diagnosis not present

## 2023-07-03 DIAGNOSIS — E11622 Type 2 diabetes mellitus with other skin ulcer: Secondary | ICD-10-CM | POA: Diagnosis not present

## 2023-07-03 DIAGNOSIS — Z7901 Long term (current) use of anticoagulants: Secondary | ICD-10-CM | POA: Diagnosis not present

## 2023-07-03 DIAGNOSIS — L97821 Non-pressure chronic ulcer of other part of left lower leg limited to breakdown of skin: Secondary | ICD-10-CM | POA: Diagnosis not present

## 2023-07-03 LAB — COMPREHENSIVE METABOLIC PANEL
ALT: 35 U/L (ref 0–53)
AST: 33 U/L (ref 0–37)
Albumin: 4.2 g/dL (ref 3.5–5.2)
Alkaline Phosphatase: 56 U/L (ref 39–117)
BUN: 15 mg/dL (ref 6–23)
CO2: 30 meq/L (ref 19–32)
Calcium: 9.8 mg/dL (ref 8.4–10.5)
Chloride: 100 meq/L (ref 96–112)
Creatinine, Ser: 0.73 mg/dL (ref 0.40–1.50)
GFR: 98.73 mL/min (ref >60.00)
Glucose, Bld: 111 mg/dL — ABNORMAL HIGH (ref 70–99)
Potassium: 4.1 meq/L (ref 3.5–5.1)
Sodium: 137 meq/L (ref 135–145)
Total Bilirubin: 0.3 mg/dL (ref 0.2–1.2)
Total Protein: 7 g/dL (ref 6.0–8.3)

## 2023-07-03 LAB — LIPID PANEL
Cholesterol: 145 mg/dL (ref 0–200)
HDL: 49.8 mg/dL (ref >39.00)
LDL Cholesterol: 72 mg/dL (ref 0–99)
NonHDL: 95.32
Total CHOL/HDL Ratio: 3
Triglycerides: 119 mg/dL (ref 0.0–149.0)
VLDL: 23.8 mg/dL (ref 0.0–40.0)

## 2023-07-03 LAB — HEMOGLOBIN A1C: Hgb A1c MFr Bld: 6.4 % (ref 4.6–6.5)

## 2023-07-04 DIAGNOSIS — G4733 Obstructive sleep apnea (adult) (pediatric): Secondary | ICD-10-CM | POA: Diagnosis not present

## 2023-07-04 DIAGNOSIS — E1142 Type 2 diabetes mellitus with diabetic polyneuropathy: Secondary | ICD-10-CM | POA: Diagnosis not present

## 2023-07-04 DIAGNOSIS — S81802D Unspecified open wound, left lower leg, subsequent encounter: Secondary | ICD-10-CM | POA: Diagnosis not present

## 2023-07-04 DIAGNOSIS — K760 Fatty (change of) liver, not elsewhere classified: Secondary | ICD-10-CM | POA: Diagnosis not present

## 2023-07-04 DIAGNOSIS — E11622 Type 2 diabetes mellitus with other skin ulcer: Secondary | ICD-10-CM | POA: Diagnosis not present

## 2023-07-04 DIAGNOSIS — E7800 Pure hypercholesterolemia, unspecified: Secondary | ICD-10-CM | POA: Diagnosis not present

## 2023-07-04 DIAGNOSIS — Z7901 Long term (current) use of anticoagulants: Secondary | ICD-10-CM | POA: Diagnosis not present

## 2023-07-04 DIAGNOSIS — L97821 Non-pressure chronic ulcer of other part of left lower leg limited to breakdown of skin: Secondary | ICD-10-CM | POA: Diagnosis not present

## 2023-07-04 DIAGNOSIS — H3411 Central retinal artery occlusion, right eye: Secondary | ICD-10-CM | POA: Diagnosis not present

## 2023-07-05 ENCOUNTER — Telehealth: Payer: Self-pay | Admitting: Orthopedic Surgery

## 2023-07-05 NOTE — Telephone Encounter (Signed)
Lisa called. She is the nurse working with the patient in home for wound care. She says they will not be going out to his home next week. Will start back up 07/08/2023. Her cb# [phone]

## 2023-07-05 NOTE — Telephone Encounter (Signed)
Noted  

## 2023-07-09 ENCOUNTER — Ambulatory Visit (INDEPENDENT_AMBULATORY_CARE_PROVIDER_SITE_OTHER): Payer: Medicare HMO | Admitting: Endocrinology

## 2023-07-09 ENCOUNTER — Encounter: Payer: Self-pay | Admitting: Endocrinology

## 2023-07-09 VITALS — BP 130/60 | HR 101 | Resp 20 | Ht 72.0 in | Wt 192.8 lb

## 2023-07-09 DIAGNOSIS — Z794 Long term (current) use of insulin: Secondary | ICD-10-CM

## 2023-07-09 DIAGNOSIS — E118 Type 2 diabetes mellitus with unspecified complications: Secondary | ICD-10-CM | POA: Diagnosis not present

## 2023-07-09 DIAGNOSIS — E1165 Type 2 diabetes mellitus with hyperglycemia: Secondary | ICD-10-CM

## 2023-07-09 MED ORDER — PIOGLITAZONE HCL 30 MG PO TABS: 30.0000 mg | ORAL_TABLET | Freq: Every day | ORAL | 3 refills | Status: DC

## 2023-07-09 MED ORDER — INSULIN ASPART 100 UNIT/ML IJ SOLN: INTRAMUSCULAR | 4 refills | Status: AC

## 2023-07-09 MED ORDER — METFORMIN HCL ER 750 MG PO TB24: 1500.0000 mg | ORAL_TABLET | Freq: Every day | ORAL | 3 refills | Status: DC

## 2023-07-09 MED ORDER — EMPAGLIFLOZIN 25 MG PO TABS: 25.0000 mg | ORAL_TABLET | Freq: Every day | ORAL | 3 refills | Status: DC
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
## 2023-07-09 NOTE — Patient Instructions (Signed)
Try novolog 5 units breakfast and 10 units with supper. If bedtime sugar still high, okay to up to 15 units with supper.   Rest diabetes medications same.

## 2023-07-15 ENCOUNTER — Ambulatory Visit: Payer: Medicare HMO | Admitting: Pharmacist

## 2023-07-15 ENCOUNTER — Telehealth: Payer: Self-pay | Admitting: Orthopedic Surgery

## 2023-07-15 ENCOUNTER — Ambulatory Visit: Payer: Medicare HMO | Admitting: Orthopedic Surgery

## 2023-07-15 DIAGNOSIS — I63412 Cerebral infarction due to embolism of left middle cerebral artery: Secondary | ICD-10-CM

## 2023-07-15 DIAGNOSIS — L03116 Cellulitis of left lower limb: Secondary | ICD-10-CM

## 2023-07-15 DIAGNOSIS — Z7901 Long term (current) use of anticoagulants: Secondary | ICD-10-CM | POA: Diagnosis not present

## 2023-07-15 DIAGNOSIS — H349 Unspecified retinal vascular occlusion: Secondary | ICD-10-CM

## 2023-07-15 DIAGNOSIS — H3411 Central retinal artery occlusion, right eye: Secondary | ICD-10-CM | POA: Diagnosis not present

## 2023-07-15 DIAGNOSIS — H3410 Central retinal artery occlusion, unspecified eye: Secondary | ICD-10-CM

## 2023-07-15 DIAGNOSIS — Z8673 Personal history of transient ischemic attack (TIA), and cerebral infarction without residual deficits: Secondary | ICD-10-CM | POA: Diagnosis not present

## 2023-07-15 DIAGNOSIS — Z89512 Acquired absence of left leg below knee: Secondary | ICD-10-CM | POA: Diagnosis not present

## 2023-07-15 LAB — POCT INR: INR: 3.3 — AB (ref 2.0–3.0)

## 2023-07-15 MED ORDER — DOXYCYCLINE HYCLATE 100 MG PO TABS: 100.0000 mg | ORAL_TABLET | Freq: Two times a day (BID) | ORAL | 0 refills | Status: DC

## 2023-07-15 NOTE — Patient Instructions (Signed)
Patient instructed to take medications as defined in the Anti-coagulation Track section of this encounter.  Patient instructed to take today's dose.  Patient instructed to take two (2) of your 5 mg peach-colored warfarin tablets, by mouth, once-daily. Patient verbalized understanding of these instructions.

## 2023-07-15 NOTE — Telephone Encounter (Signed)
Signed and in outgoing fax folder

## 2023-07-15 NOTE — Progress Notes (Signed)
Anticoagulation Management Gregory May is a 62 y.o. male who reports to the clinic for monitoring of warfarin treatment.    Indication:  Recurrent DVT of lower extremity, History of; Central retinal artery occlusion; History of right MCA stroke, Right retinal embolus; long term current use of oral anticoagulant warfarin with target INR range 2.0 - 3.0.   Duration: indefinite Supervising physician:  Dickie La, MD  Anticoagulation Clinic Visit History: Patient does not report signs/symptoms of bleeding or thromboembolism  Other recent changes: No diet, medications, lifestyle changes except as noted in patient findings.  Anticoagulation Episode Summary     Current INR goal:  2.0-3.0  TTR:  64.8% (1.2 y)  Next INR check:  07/29/2023  INR from last check:  3.3 (07/15/2023)  Weekly max warfarin dose:  --  Target end date:  --  INR check location:  --  Preferred lab:  --  Send INR reminders to:  --   Indications   Pulmonary emboli (HCC) (Resolved) [I26.99] DVT (deep venous thrombosis) (HCC) (Resolved) [I82.409] Retinal artery occlusion central [H34.10] Right retinal embolus [H34.9] Long term (current) use of anticoagulants [Z79.01]        Comments:  --         No Known Allergies  Current Outpatient Medications:    Accu-Chek FastClix Lancets MISC, Use Accu Chek Fastclix lancets to check blood sugar three times daily. DX:E11.65, Disp: 300 each, Rfl: 2   ACCU-CHEK GUIDE test strip, TEST BLOOD SUGAR THREE TIMES DAILY AS DIRECTED, Disp: 300 strip, Rfl: 3   acetaminophen (TYLENOL) 500 MG tablet, Take 1,000 mg by mouth as needed for mild pain or moderate pain., Disp: , Rfl:    APPLE CIDER VINEGAR PO, Take 1 tablet by mouth daily., Disp: , Rfl:    ascorbic acid (VITAMIN C) 1000 MG tablet, Take 1 tablet (1,000 mg total) by mouth daily., Disp: , Rfl:    doxycycline (VIBRA-TABS) 100 MG tablet, Take 1 tablet (100 mg total) by mouth 2 (two) times daily., Disp: 28 tablet, Rfl: 0    empagliflozin (JARDIANCE) 25 MG TABS tablet, Take 1 tablet (25 mg total) by mouth daily., Disp: 90 tablet, Rfl: 3   fluticasone (FLONASE) 50 MCG/ACT nasal spray, USE 2 SPRAY(S) IN EACH NOSTRIL AT BEDTIME AS NEEDED FOR  SINUS  DRAINAGE, Disp: 16 g, Rfl: 0   gabapentin (NEURONTIN) 300 MG capsule, TAKE 1 CAPSULE FOUR TIMES DAILY AS NEEDED, Disp: 360 capsule, Rfl: 3   HYDROcodone-acetaminophen (NORCO/VICODIN) 5-325 MG tablet, Take 1 tablet by mouth every 6 (six) hours as needed for moderate pain., Disp: 30 tablet, Rfl: 0   insulin aspart (NOVOLOG) 100 UNIT/ML injection, Take 5 units with breakfast and 10 units with supper, okay to increase up to 15 units with supper for hyperglycemia., Disp: 10 mL, Rfl: 4   Insulin Syringes, Disposable, U-100 0.5 ML MISC, Use to inject insulin, Disp: 100 each, Rfl: 2   metFORMIN (GLUCOPHAGE-XR) 750 MG 24 hr tablet, Take 2 tablets (1,500 mg total) by mouth daily., Disp: 180 tablet, Rfl: 3   Multiple Vitamin (MULTIVITAMIN WITH MINERALS) TABS tablet, Take 1 tablet by mouth every morning. Centrum, Disp: , Rfl:    pioglitazone (ACTOS) 30 MG tablet, Take 1 tablet (30 mg total) by mouth daily., Disp: 90 tablet, Rfl: 3   prednisoLONE acetate (PRED FORTE) 1 % ophthalmic suspension, Place 1 drop into the right eye every evening., Disp: , Rfl:    rosuvastatin (CRESTOR) 20 MG tablet, TAKE 1 TABLET EVERY DAY, Disp:  90 tablet, Rfl: 3   warfarin (COUMADIN) 5 MG tablet, Take 2 (two) of your 5 mg peach-colored warfarin tablets on Sundays, Mondays, Wednesdays, Fridays and Saturdays. All OTHER DAYS, (Tuesdays and Thursdays), take two and one-half ( 2 & 1/2 tablets.), Disp: 60 tablet, Rfl: 3   zinc sulfate 220 (50 Zn) MG capsule, Take 1 capsule (220 mg total) by mouth daily., Disp: , Rfl:  Past Medical History:  Diagnosis Date   Arthritis    bilateral hips   Cutaneous abscess of left foot    Deep vein thrombosis (DVT) (HCC)    Diabetes mellitus    type II   Diabetic ulcer of heel (HCC)     Right heel   DJD (degenerative joint disease)    DVT (deep venous thrombosis) (HCC) 02/08/2014   Proximal provoked. Date of diagnosis February 08 2014 Duration of anticoagulation: 6 months. End date 08/12/2014.  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 Duration of anticoagulation: 8 months. End date 10/11/2014.  Anticoagulant: Lovenox 120 units daily Switched to Eliquis on 05/25/2014 per patient preference    Pulmonary embolism (HCC)    Sebaceous cyst    on back of neck   Social History   Socioeconomic History   Marital status: Single    Spouse name: Not on file   Number of children: 0   Years of education: Not on file   Highest education level: Not on file  Occupational History   Occupation: disabled  Tobacco Use   Smoking status: Never   Smokeless tobacco: Never  Vaping Use   Vaping status: Never Used  Substance and Sexual Activity   Alcohol use: Not Currently    Comment: beer and mixed drink maybe 5  times a month   Drug use: No   Sexual activity: Never  Other Topics Concern   Not on file  Social History Narrative   Not on file   Social Determinants of Health   Financial Resource Strain: Low Risk  (01/16/2023)   Overall Financial Resource Strain (CARDIA)    Difficulty of Paying Living Expenses: Not hard at all  Food Insecurity: No Food Insecurity (01/16/2023)   Hunger Vital Sign    Worried About Running Out of Food in the Last Year: Never true    Ran Out of Food in the Last Year: Never true  Transportation Needs: No Transportation Needs (01/16/2023)   PRAPARE - Administrator, Civil Service (Medical):  No    Lack of Transportation (Non-Medical): No  Physical Activity: Inactive (01/16/2023)   Exercise Vital Sign    Days of Exercise per Week: 0 days    Minutes of Exercise per Session: 0 min  Stress: No Stress Concern Present (01/16/2023)   [employer] of Occupational Health - Occupational Stress Questionnaire    Feeling of Stress : Only a little  Social Connections: Socially Isolated (01/16/2023)   Social Connection and Isolation Panel [NHANES]    Frequency of Communication with Friends and Family: More than three times a week    Frequency of Social Gatherings with Friends and Family: Twice a week  Attends Religious Services: Never    Active Member of Clubs or Organizations: No    Attends Banker Meetings: Never    Marital Status: Never married   Family History  Problem Relation Age of Onset   Breast cancer Mother    Cancer Mother        small intestine   Liver cancer Mother    Diabetes Mother    Diabetes Father    Diabetes Brother    Hypertension Maternal Grandmother    Heart Problems Maternal Grandmother    Diabetes Paternal Grandmother    Diabetes Paternal Grandfather    Diabetes Brother     ASSESSMENT Recent Results: The most recent result is correlated with 75 mg per week: Lab Results  Component Value Date   INR 3.3 (A) 07/15/2023   INR 2.5 06/17/2023   INR 3.3 (A) 05/20/2023    Anticoagulation Dosing: Description   Take two (2) of your 5 mg peach-colored warfarin tablets, by mouth, once-daily.      INR today: Supratherapeutic  PLAN Weekly dose was decreased by 7% to 70 mg per week  Patient Instructions  Patient instructed to take medications as defined in the Anti-coagulation Track section of this encounter.  Patient instructed to take today's dose.  Patient instructed to take two (2) of your 5 mg peach-colored warfarin tablets, by mouth, once-daily. Patient verbalized understanding of these instructions.  Patient advised to contact  clinic or seek medical attention if signs/symptoms of bleeding or thromboembolism occur.  Patient verbalized understanding by repeating back information and was advised to contact me if further medication-related questions arise. Patient was also provided an information handout.  Follow-up Return in about 2 weeks (around 07/29/2023) for Follow up INR.  Elicia Lamp, PharmD, CPP  15 minutes spent face-to-face with the patient during the encounter. 50% of time spent on education, including signs/sx bleeding and clotting, as well as food and drug interactions with warfarin. 50% of time was spent on fingerprick POC INR sample collection,processing, results determination, and documentation in [email or web address].

## 2023-07-15 NOTE — Telephone Encounter (Signed)
[facility] called stating they faxed over 2 orders last week I will have them fax it over today please advise

## 2023-07-15 NOTE — Telephone Encounter (Signed)
Pending MD signature. Will look in folder to get signed.

## 2023-07-16 ENCOUNTER — Encounter: Payer: Self-pay | Admitting: Orthopedic Surgery

## 2023-07-16 NOTE — Progress Notes (Addendum)
Office Visit Note   Patient: Gregory May           Date of Birth: [birth year]           MRN: [id number] Visit Date: 07/15/2023              Requested by: Gwenevere Abbot, MD [address] [place],  [place] [address] PCP: Gwenevere Abbot, MD  Chief Complaint  Patient presents with   Left Leg - Follow-up    Left below knee amputation 09/29/2022      HPI: Patient is a 62 year old gentleman who is seen in follow-up for left below-knee amputation.  Patient states that all of the ulcers have healed except for a residual ulcer over the residual limb.  Patient complains of clear drainage.  Assessment & Plan: Visit Diagnoses:  1. Left below-knee amputee (HCC)   2. Cellulitis of left lower extremity     Plan: Prescription provided for doxycycline.  Continue dry dressing changes and compression.  Follow-Up Instructions: Return in about 2 weeks (around 07/29/2023).   Ortho Exam  Patient is alert, oriented, no adenopathy, well-dressed, normal affect, normal respiratory effort. Examination patient has shown excellent improvement in the multiple ulcers in the left residual limb.  There is only 1 superficial ulcer over the end of the residual limb that is 3 cm in diameter is flat and has clear drainage.  There is no undermining no tunneling.  No cellulitis.  Patient is a new left transtibial  amputee.  Patient's current comorbidities are not expected to impact the ability to function with the prescribed prosthesis. Patient verbally communicates a strong desire to use a prosthesis. Patient currently requires mobility aids to ambulate without a prosthesis.  Expects not to use mobility aids with a new prosthesis.  Patient is a K3 level ambulator that spends a lot of time walking around on uneven terrain over obstacles, up and down stairs, and ambulates with a variable cadence.     Imaging: No results found. No images are attached to the encounter.  Labs: Lab Results  Component  Value Date   HGBA1C 6.4 07/02/2023   HGBA1C 6.6 (A) 03/18/2023   HGBA1C 6.9 (H) 09/24/2022   ESRSEDRATE 11 11/15/2021   ESRSEDRATE 25 (H) 08/19/2021   ESRSEDRATE 8 07/15/2019   CRP 0.7 11/17/2021   CRP <0.5 11/15/2021   CRP 1.7 (H) 08/19/2021   REPTSTATUS 10/03/2022 FINAL 09/28/2022   REPTSTATUS 10/03/2022 FINAL 09/28/2022   GRAMSTAIN  08/21/2021    WBC PRESENT,BOTH PMN AND MONONUCLEAR NO ORGANISMS SEEN    CULT  09/28/2022    NO GROWTH 5 DAYS Performed at [facility], [address], [place], [place] [address]    CULT  09/28/2022    NO GROWTH 5 DAYS Performed at [facility], [address], [place], [place] [address]    [facility] STAPHYLOCOCCUS HOMINIS 08/21/2021     Lab Results  Component Value Date   ALBUMIN 4.2 07/02/2023   ALBUMIN 3.8 12/26/2022   ALBUMIN 2.8 (L) 09/28/2022    Lab Results  Component Value Date   MG 1.9 11/15/2021   Lab Results  Component Value Date   VD25OH 16 (L) 11/05/2013    No results found for: "PREALBUMIN"    Latest Ref Rng & Units 10/03/2022    2:13 AM 10/01/2022    5:02 AM 09/30/2022    4:06 AM  CBC EXTENDED  WBC 4.0 - 10.5 K/uL 12.8  12.6  14.0  RBC 4.22 - 5.81 MIL/uL 4.26  4.25  3.84   Hemoglobin 13.0 - 17.0 g/dL 16.1  09.6  04.5   HCT 39.0 - 52.0 % 39.1  39.4  36.3   Platelets 150 - 400 K/uL 413  369  325      There is no height or weight on file to calculate BMI.  Orders:  No orders of the defined types were placed in this encounter.  Meds ordered this encounter  Medications   doxycycline (VIBRA-TABS) 100 MG tablet    Sig: Take 1 tablet (100 mg total) by mouth 2 (two) times daily.    Dispense:  28 tablet    Refill:  0     Procedures: No procedures performed  Clinical Data: No additional findings.  ROS:  All other systems negative, except as noted in the HPI. Review of Systems  Objective: Vital Signs: There were no vitals taken for this visit.  Specialty Comments:  No specialty comments  available.  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) 10/03/2022   Lower extremity cellulitis 09/29/2022   Acute osteomyelitis of left foot (HCC) 09/29/2022   PFO with atrial septal aneurysm    Long term (current) use of anticoagulants 02/26/2022   Right retinal embolus 01/15/2022   DM type 2 with diabetic peripheral neuropathy (HCC) 01/15/2022   Obstructive sleep apnea 01/15/2022   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   Transportation insecurity 11/23/2021   Retinal artery occlusion, central 11/15/2021   Epidermal inclusion cyst 10/18/2021   Amputated finger 09/05/2021   Osteomyelitis of finger of right hand (HCC) 08/19/2021   Hepatic steatosis 06/24/2020   Back pain 04/20/2020   Left knee pain 04/20/2020   Allergic sinusitis 04/20/2020   Elevated liver enzymes 04/20/2020   Cutaneous abscess of left foot    Diastasis of rectus abdominis 07/16/2019   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 10/07/2018   Essential hypertension 06/19/2018   Peripheral neuropathy 12/19/2017   Left below-knee amputee (HCC) 04/17/2017   Carpal tunnel syndrome 03/18/2017   Healthcare maintenance 06/21/2015   Status post below knee amputation of right lower extremity (HCC) 06/07/2015   Diabetes mellitus with carpal tunnel syndrome (HCC) 12/09/2014   DJD (degenerative joint disease) of knee 07/21/2014   Osteoarthritis, hip, bilateral 05/25/2014   Onychomycosis 10/14/2013   Pure hypercholesterolemia 09/25/2013   Type 2 diabetes with complication (HCC) 09/15/1997   Past Medical History:  Diagnosis Date   Arthritis    bilateral hips   Cutaneous abscess of left foot    Deep vein thrombosis (DVT) (HCC)    Diabetes mellitus    type II   Diabetic ulcer of heel (HCC)    Right heel   DJD (degenerative joint disease)    DVT (deep venous thrombosis) (HCC) 02/08/2014    Proximal provoked. Date of diagnosis February 08 2014 Duration of anticoagulation: 6 months. End date 08/12/2014.  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 Duration of anticoagulation: 8 months. End date 10/11/2014.  Anticoagulant: Lovenox 120 units daily Switched to Eliquis on 05/25/2014 per patient preference    Pulmonary embolism (HCC)    Sebaceous cyst    on back of neck    Family History  Problem Relation Age of Onset   Breast cancer Mother    Cancer Mother        small intestine   Liver cancer Mother    Diabetes Mother    Diabetes Father    Diabetes Brother    Hypertension Maternal Grandmother    Heart Problems Maternal Grandmother    Diabetes Paternal Grandmother    Diabetes Paternal Grandfather    Diabetes Brother     Past Surgical History:  Procedure Laterality Date   AMPUTATION Right 06/03/2015   Procedure: Right Below Knee Amputation;  Surgeon: Nadara Mustard, MD;  Location: [facility] OR;  Service: Orthopedics;  Laterality: Right;   AMPUTATION Left 07/24/2019   Procedure: LEFT FOOT 3RD RAY AMPUTATION;  Surgeon: Nadara Mustard, MD;  Location: [facility] OR;  Service: Orthopedics;  Laterality: Left;   AMPUTATION Left 06/23/2021   Procedure: LEFT 2ND TOE AMPUTATION;  Surgeon: Nadara Mustard, MD;  Location: [facility] OR;  Service: Orthopedics;  Laterality: Left;   AMPUTATION Right 08/21/2021   Procedure: AMPUTATION RIGHT INDEX FINGER;  Surgeon: Gomez Cleverly, MD;  Location: [facility] OR;  Service: Orthopedics;  Laterality: Right;   AMPUTATION Left 09/29/2022   Procedure: AMPUTATION BELOW KNEE;  Surgeon: Nadara Mustard, MD;  Location: [facility] OR;  Service:  Orthopedics;  Laterality: Left;   BUBBLE STUDY  01/24/2022   Procedure: BUBBLE STUDY;  Surgeon: Wendall Stade, MD;  Location: [facility] ENDOSCOPY;  Service: Cardiovascular;;   CLOSED REDUCTION WITH HUMER PIN INSERTION  1974   left hip   HARDWARE REMOVAL Left 07/21/2014   Procedure: HARDWARE REMOVAL;  Surgeon: Loreta Ave, MD;  Location: [facility] OR;  Service: Orthopedics;  Laterality: Left;   I & D EXTREMITY Right 08/21/2021   Procedure: IRRIGATION AND DEBRIDEMENT RIGHT INDEX FINGER;  Surgeon: Gomez Cleverly, MD;  Location: [facility] OR;  Service: Orthopedics;  Laterality: Right;   PATENT FORAMEN OVALE(PFO) CLOSURE N/A 02/28/2022   Procedure: PATENT FORAMEN OVALE(PFO) CLOSURE;  Surgeon: Tonny Bollman, MD;  Location: [facility] INVASIVE CV LAB;  Service: Cardiovascular;  Laterality: N/A;   ROTATOR CUFF REPAIR Right 2005 (approx)   TEE WITHOUT CARDIOVERSION N/A 01/24/2022   Procedure: TRANSESOPHAGEAL ECHOCARDIOGRAM (TEE);  Surgeon: Wendall Stade, MD;  Location: [facility] ENDOSCOPY;  Service: Cardiovascular;  Laterality: N/A;   TOTAL HIP ARTHROPLASTY Left 07/21/2014   Procedure: TOTAL HIP ARTHROPLASTY ANTERIOR APPROACH;  Surgeon: Loreta Ave, MD;  Location: [facility] OR;  Service: Orthopedics;  Laterality: Left;   TOTAL HIP ARTHROPLASTY Right 2006 (approx)   right hip replaced   Social History   Occupational History   Occupation: disabled  Tobacco Use   Smoking status: Never   Smokeless tobacco: Never  Vaping Use   Vaping status: Never Used  Substance and Sexual Activity   Alcohol use: Not Currently    Comment: beer and mixed drink maybe 5  times a month   Drug use: No   Sexual activity: Never

## 2023-07-17 DIAGNOSIS — E7800 Pure hypercholesterolemia, unspecified: Secondary | ICD-10-CM | POA: Diagnosis not present

## 2023-07-17 DIAGNOSIS — E11622 Type 2 diabetes mellitus with other skin ulcer: Secondary | ICD-10-CM | POA: Diagnosis not present

## 2023-07-17 DIAGNOSIS — L97821 Non-pressure chronic ulcer of other part of left lower leg limited to breakdown of skin: Secondary | ICD-10-CM | POA: Diagnosis not present

## 2023-07-17 DIAGNOSIS — Z7901 Long term (current) use of anticoagulants: Secondary | ICD-10-CM | POA: Diagnosis not present

## 2023-07-17 DIAGNOSIS — S81802D Unspecified open wound, left lower leg, subsequent encounter: Secondary | ICD-10-CM | POA: Diagnosis not present

## 2023-07-17 DIAGNOSIS — K760 Fatty (change of) liver, not elsewhere classified: Secondary | ICD-10-CM | POA: Diagnosis not present

## 2023-07-17 DIAGNOSIS — E1142 Type 2 diabetes mellitus with diabetic polyneuropathy: Secondary | ICD-10-CM | POA: Diagnosis not present

## 2023-07-17 DIAGNOSIS — G4733 Obstructive sleep apnea (adult) (pediatric): Secondary | ICD-10-CM | POA: Diagnosis not present

## 2023-07-17 DIAGNOSIS — H3411 Central retinal artery occlusion, right eye: Secondary | ICD-10-CM | POA: Diagnosis not present

## 2023-07-18 DIAGNOSIS — H3411 Central retinal artery occlusion, right eye: Secondary | ICD-10-CM | POA: Diagnosis not present

## 2023-07-18 DIAGNOSIS — G4733 Obstructive sleep apnea (adult) (pediatric): Secondary | ICD-10-CM | POA: Diagnosis not present

## 2023-07-18 DIAGNOSIS — E7800 Pure hypercholesterolemia, unspecified: Secondary | ICD-10-CM | POA: Diagnosis not present

## 2023-07-18 DIAGNOSIS — E11622 Type 2 diabetes mellitus with other skin ulcer: Secondary | ICD-10-CM | POA: Diagnosis not present

## 2023-07-18 DIAGNOSIS — E1142 Type 2 diabetes mellitus with diabetic polyneuropathy: Secondary | ICD-10-CM | POA: Diagnosis not present

## 2023-07-18 DIAGNOSIS — K760 Fatty (change of) liver, not elsewhere classified: Secondary | ICD-10-CM | POA: Diagnosis not present

## 2023-07-18 DIAGNOSIS — S81802D Unspecified open wound, left lower leg, subsequent encounter: Secondary | ICD-10-CM | POA: Diagnosis not present

## 2023-07-18 DIAGNOSIS — Z7901 Long term (current) use of anticoagulants: Secondary | ICD-10-CM | POA: Diagnosis not present

## 2023-07-18 DIAGNOSIS — L97821 Non-pressure chronic ulcer of other part of left lower leg limited to breakdown of skin: Secondary | ICD-10-CM | POA: Diagnosis not present

## 2023-07-24 ENCOUNTER — Telehealth: Payer: Self-pay | Admitting: Orthopedic Surgery

## 2023-07-24 DIAGNOSIS — E7800 Pure hypercholesterolemia, unspecified: Secondary | ICD-10-CM | POA: Diagnosis not present

## 2023-07-24 DIAGNOSIS — G4733 Obstructive sleep apnea (adult) (pediatric): Secondary | ICD-10-CM | POA: Diagnosis not present

## 2023-07-24 DIAGNOSIS — E1142 Type 2 diabetes mellitus with diabetic polyneuropathy: Secondary | ICD-10-CM | POA: Diagnosis not present

## 2023-07-24 DIAGNOSIS — S81802D Unspecified open wound, left lower leg, subsequent encounter: Secondary | ICD-10-CM | POA: Diagnosis not present

## 2023-07-24 DIAGNOSIS — K760 Fatty (change of) liver, not elsewhere classified: Secondary | ICD-10-CM | POA: Diagnosis not present

## 2023-07-24 DIAGNOSIS — H3411 Central retinal artery occlusion, right eye: Secondary | ICD-10-CM | POA: Diagnosis not present

## 2023-07-24 DIAGNOSIS — Z7901 Long term (current) use of anticoagulants: Secondary | ICD-10-CM | POA: Diagnosis not present

## 2023-07-24 DIAGNOSIS — E11622 Type 2 diabetes mellitus with other skin ulcer: Secondary | ICD-10-CM | POA: Diagnosis not present

## 2023-07-24 DIAGNOSIS — L97821 Non-pressure chronic ulcer of other part of left lower leg limited to breakdown of skin: Secondary | ICD-10-CM | POA: Diagnosis not present

## 2023-07-24 NOTE — Telephone Encounter (Signed)
07/15/23 progress note faxed to [facility] [phone]

## 2023-07-29 ENCOUNTER — Ambulatory Visit (INDEPENDENT_AMBULATORY_CARE_PROVIDER_SITE_OTHER): Payer: Medicare HMO | Admitting: Orthopedic Surgery

## 2023-07-29 ENCOUNTER — Encounter: Payer: Self-pay | Admitting: Orthopedic Surgery

## 2023-07-29 ENCOUNTER — Ambulatory Visit (INDEPENDENT_AMBULATORY_CARE_PROVIDER_SITE_OTHER): Payer: Medicare HMO | Admitting: Pharmacist

## 2023-07-29 DIAGNOSIS — Z89512 Acquired absence of left leg below knee: Secondary | ICD-10-CM

## 2023-07-29 DIAGNOSIS — H3410 Central retinal artery occlusion, unspecified eye: Secondary | ICD-10-CM

## 2023-07-29 DIAGNOSIS — Z7901 Long term (current) use of anticoagulants: Secondary | ICD-10-CM | POA: Diagnosis not present

## 2023-07-29 DIAGNOSIS — H349 Unspecified retinal vascular occlusion: Secondary | ICD-10-CM

## 2023-07-29 LAB — POCT INR: INR: 2.7 (ref 2.0–3.0)

## 2023-07-29 NOTE — Progress Notes (Signed)
Anticoagulation Management Gregory May is a 62 y.o. male who reports to the clinic for monitoring of warfarin treatment.    Indication:  Retinal occlusion, Central; Right Retinal Embolism; Long term current use of oral anticoagulation with warfarin for target range INR 2.0 - 3.0.   Duration: indefinite Supervising physician:  Mercie Eon, MD  Anticoagulation Clinic Visit History: Patient does not report signs/symptoms of bleeding or thromboembolism  Other recent changes: No diet, medications, lifestyle changes except as noted in patient findings.  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 DX:E11.65, Disp: 300 each, Rfl: 2   ACCU-CHEK GUIDE test strip, TEST BLOOD SUGAR THREE TIMES DAILY AS DIRECTED, Disp: 300 strip, Rfl: 3   acetaminophen (TYLENOL) 500 MG tablet, Take 1,000 mg by mouth as needed for mild pain or moderate pain., Disp: , Rfl:    APPLE CIDER VINEGAR PO, Take 1 tablet by mouth daily., Disp: , Rfl:    ascorbic acid (VITAMIN C) 1000 MG tablet, Take 1 tablet (1,000 mg total) by mouth daily., Disp: , Rfl:    doxycycline (VIBRA-TABS) 100 MG tablet, Take 1 tablet (100 mg total) by mouth 2 (two) times daily., Disp: 28 tablet, Rfl: 0   empagliflozin (JARDIANCE) 25 MG TABS tablet, Take 1 tablet (25 mg total)  by mouth daily., Disp: 90 tablet, Rfl: 3   fluticasone (FLONASE) 50 MCG/ACT nasal spray, USE 2 SPRAY(S) IN EACH NOSTRIL AT BEDTIME AS NEEDED FOR  SINUS  DRAINAGE, Disp: 16 g, Rfl: 0   gabapentin (NEURONTIN) 300 MG capsule, TAKE 1 CAPSULE FOUR TIMES DAILY AS NEEDED, Disp: 360 capsule, Rfl: 3   HYDROcodone-acetaminophen (NORCO/VICODIN) 5-325 MG tablet, Take 1 tablet by mouth every 6 (six) hours as needed for moderate pain., Disp: 30 tablet, Rfl: 0   insulin aspart (NOVOLOG) 100 UNIT/ML injection, Take 5 units with breakfast and 10 units with supper, okay to increase up to 15 units with supper for hyperglycemia., Disp: 10 mL, Rfl: 4   Insulin Syringes, Disposable, U-100 0.5 ML MISC, Use to inject insulin, Disp: 100 each, Rfl: 2   metFORMIN (GLUCOPHAGE-XR) 750 MG 24 hr tablet, Take 2 tablets (1,500 mg total) by mouth daily., Disp: 180 tablet, Rfl: 3   Multiple Vitamin (MULTIVITAMIN WITH MINERALS) TABS tablet, Take 1 tablet by mouth every morning. Centrum, Disp: , Rfl:    pioglitazone (ACTOS) 30 MG tablet, Take 1 tablet (30 mg total) by mouth daily., Disp: 90 tablet, Rfl: 3   prednisoLONE acetate (PRED FORTE) 1 % ophthalmic suspension, Place 1 drop into the right eye every evening., Disp: , Rfl:    rosuvastatin (CRESTOR) 20 MG tablet, TAKE 1 TABLET EVERY DAY, Disp: 90 tablet, Rfl: 3   warfarin (COUMADIN) 5 MG tablet, Take  2 (two) of your 5 mg peach-colored warfarin tablets on Sundays, Mondays, Wednesdays, Fridays and Saturdays. All OTHER DAYS, (Tuesdays and Thursdays), take two and one-half ( 2 & 1/2 tablets.), Disp: 60 tablet, Rfl: 3   zinc sulfate 220 (50 Zn) MG capsule, Take 1 capsule (220 mg total) by mouth daily., Disp: , Rfl:  Past Medical History:  Diagnosis Date   Arthritis    bilateral hips   Cutaneous abscess of left foot    Deep vein thrombosis (DVT) (HCC)    Diabetes mellitus    type II   Diabetic ulcer of heel (HCC)    Right heel   DJD (degenerative joint disease)    DVT (deep venous  thrombosis) (HCC) 02/08/2014   Proximal provoked. Date of diagnosis February 08 2014 Duration of anticoagulation: 6 months. End date 08/12/2014.  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 Duration of anticoagulation: 8 months. End date 10/11/2014.  Anticoagulant: Lovenox 120 units daily Switched to Eliquis on 05/25/2014 per patient preference    Pulmonary embolism (HCC)    Sebaceous cyst    on back of neck   Social History   Socioeconomic History   Marital status: Single    Spouse name: Not on file   Number of children: 0   Years of education: Not on file   Highest education level: Not on file  Occupational History   Occupation: disabled  Tobacco Use   Smoking status: Never   Smokeless tobacco: Never  Vaping Use   Vaping status: Never Used  Substance and Sexual Activity   Alcohol use: Not Currently    Comment: beer and mixed drink maybe 5  times a month   Drug use: No   Sexual activity: Never  Other Topics Concern   Not on file  Social History Narrative   Not on file   Social Drivers of Health   Financial Resource Strain: Low Risk  (01/16/2023)   Overall Financial Resource Strain (CARDIA)    Difficulty of Paying Living Expenses: Not hard at all  Food Insecurity: No Food Insecurity (01/16/2023)   Hunger Vital Sign    Worried About Running Out of Food in the Last Year: Never true    Ran Out of Food in the Last Year: Never true  Transportation Needs: No Transportation Needs (01/16/2023)   PRAPARE - Administrator, Civil Service (Medical): No    Lack of Transportation (Non-Medical): No  Physical Activity:  Inactive (01/16/2023)   Exercise Vital Sign    Days of Exercise per Week: 0 days    Minutes of Exercise per Session: 0 min  Stress: No Stress Concern Present (01/16/2023)   [employer] of Occupational Health - Occupational Stress Questionnaire    Feeling of Stress : Only a little  Social Connections: Socially Isolated (01/16/2023)   Social Connection and Isolation Panel [NHANES]    Frequency of Communication with Friends and Family: More than three times a week    Frequency of Social Gatherings with Friends and Family: Twice a week    Attends Religious Services: Never    Active Member of  Clubs or Organizations: No    Attends Banker Meetings: Never    Marital Status: Never married   Family History  Problem Relation Age of Onset   Breast cancer Mother    Cancer Mother        small intestine   Liver cancer Mother    Diabetes Mother    Diabetes Father    Diabetes Brother    Hypertension Maternal Grandmother    Heart Problems Maternal Grandmother    Diabetes Paternal Grandmother    Diabetes Paternal Grandfather    Diabetes Brother     ASSESSMENT Recent Results: The most recent result is correlated with 70 mg per week: Lab Results  Component Value Date   INR 2.7 07/29/2023   INR 3.3 (A) 07/15/2023   INR 2.5 06/17/2023    Anticoagulation Dosing: Description   Take two (2) of your 5 mg peach-colored warfarin tablets, by mouth, once-daily.      INR today: Therapeutic  PLAN Weekly dose was unchanged. Continue to take two (2) of your 5 mg peach-colored warfarin tablets, by mouth, once-daily.   Patient Instructions  Patient instructed to take medications as defined in the Anti-coagulation Track section of this encounter.  Patient instructed to take today's dose.  Patient instructed to take two (2) of your 5 mg peach-colored warfarin tablets, by mouth, once-daily.  Patient verbalized understanding of these instructions.  Patient advised to contact clinic or  seek medical attention if signs/symptoms of bleeding or thromboembolism occur.  Patient verbalized understanding by repeating back information and was advised to contact me if further medication-related questions arise. Patient was also provided an information handout.  Follow-up Return in 3 weeks (on 08/19/2023) for Follow up INR.  Elicia Lamp, PharmD, CPP  15 minutes spent face-to-face with the patient during the encounter. 50% of time spent on education, including signs/sx bleeding and clotting, as well as food and drug interactions with warfarin. 50% of time was spent on fingerprick POC INR sample collection,processing, results determination, and documentation in [email or web address].

## 2023-07-29 NOTE — Patient Instructions (Signed)
 Patient instructed to take medications as defined in the Anti-coagulation Track section of this encounter.  Patient instructed to take today's dose.  Patient instructed to take two (2) of your 5 mg peach-colored warfarin tablets, by mouth, once-daily. Patient verbalized understanding of these instructions.

## 2023-07-29 NOTE — Progress Notes (Signed)
Office Visit Note   Patient: Gregory May           Date of Birth: Jul 13, 1961           MRN: [id number] Visit Date: 07/29/2023              Requested by: Gwenevere Abbot, MD [address] [place],  [place] [address] PCP: Gwenevere Abbot, MD  Chief Complaint  Patient presents with   Left Leg - Follow-up     Left below knee amputation 09/29/2022        HPI: Patient is a 62 year old gentleman status post left below-knee amputation 10 months ago.  Patient is currently using a shrinker and compression.  He is finishing up his doxycycline.  Assessment & Plan: Visit Diagnoses:  1. Left below-knee amputee [facility])     Plan: The leg continues to improve.  He will continue with current wound care  Follow-Up Instructions: Return in about 3 weeks (around 08/19/2023).   Ortho Exam  Patient is alert, oriented, no adenopathy, well-dressed, normal affect, normal respiratory effort. Examination the dermatitis and weeping edema from the medial and lateral aspect of the left residual limb has resolved.  Patient now only has weeping edema from the end of the residual limb the ulcer is flat it measures 2 x 3 cm.  His limb is cold to the touch.  There is no cellulitis no tenderness to palpation.  Imaging: No results found.   Labs: Lab Results  Component Value Date   HGBA1C 6.4 07/02/2023   HGBA1C 6.6 (A) 03/18/2023   HGBA1C 6.9 (H) 09/24/2022   ESRSEDRATE 11 11/15/2021   ESRSEDRATE 25 (H) 08/19/2021   ESRSEDRATE 8 07/15/2019   CRP 0.7 11/17/2021   CRP <0.5 11/15/2021   CRP 1.7 (H) 08/19/2021   REPTSTATUS 10/03/2022 FINAL 09/28/2022   REPTSTATUS 10/03/2022 FINAL 09/28/2022   GRAMSTAIN  08/21/2021    WBC PRESENT,BOTH PMN AND MONONUCLEAR NO ORGANISMS SEEN    CULT  09/28/2022    NO GROWTH 5 DAYS Performed at [facility], [address], [place], [place] [address]    CULT  09/28/2022    NO GROWTH 5 DAYS Performed at [facility], [address] [place],  [place] [address]    [facility] STAPHYLOCOCCUS HOMINIS 08/21/2021     Lab Results  Component Value Date   ALBUMIN 4.2 07/02/2023   ALBUMIN 3.8 12/26/2022   ALBUMIN 2.8 (L) 09/28/2022    Lab Results  Component Value Date   MG 1.9 11/15/2021   Lab Results  Component Value Date   VD25OH 16 (L) 11/05/2013    No results found for: "PREALBUMIN"    Latest Ref Rng & Units 10/03/2022    2:13 AM 10/01/2022    5:02 AM 09/30/2022    4:06 AM  CBC EXTENDED  WBC 4.0 - 10.5 K/uL 12.8  12.6  14.0   RBC 4.22 - 5.81 MIL/uL 4.26  4.25  3.84   Hemoglobin 13.0 - 17.0 g/dL 21.3  08.6  57.8   HCT 39.0 - 52.0 % 39.1  39.4  36.3   Platelets 150 - 400 K/uL 413  369  325      There is no height or weight on file to calculate BMI.  Orders:  No orders of the defined types were placed in this encounter.  No orders of the defined types were placed in this encounter.    Procedures: No procedures performed  Clinical Data: No additional  findings.  ROS:  All other systems negative, except as noted in the HPI. Review of Systems  Objective: Vital Signs: There were no vitals taken for this visit.  Specialty Comments:  No specialty comments available.  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) 10/03/2022   Lower extremity cellulitis 09/29/2022   Acute osteomyelitis of left foot (HCC) 09/29/2022   PFO with atrial septal aneurysm    Long term (current) use of anticoagulants 02/26/2022   Right retinal embolus 01/15/2022   DM type 2 with diabetic peripheral neuropathy (HCC) 01/15/2022   Obstructive sleep apnea 01/15/2022   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   Transportation insecurity 11/23/2021   Retinal artery occlusion, central 11/15/2021   Epidermal inclusion cyst 10/18/2021   Amputated finger 09/05/2021   Osteomyelitis of finger of right hand (HCC) 08/19/2021   Hepatic steatosis 06/24/2020    Back pain 04/20/2020   Left knee pain 04/20/2020   Allergic sinusitis 04/20/2020   Elevated liver enzymes 04/20/2020   Cutaneous abscess of left foot    Diastasis of rectus abdominis 07/16/2019   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 10/07/2018   Essential hypertension 06/19/2018   Peripheral neuropathy 12/19/2017   Left below-knee amputee (HCC) 04/17/2017   Carpal tunnel syndrome 03/18/2017   Healthcare maintenance 06/21/2015   Status post below knee amputation of right lower extremity (HCC) 06/07/2015   Diabetes mellitus with carpal tunnel syndrome (HCC) 12/09/2014   DJD (degenerative joint disease) of knee 07/21/2014   Osteoarthritis, hip, bilateral 05/25/2014   Onychomycosis 10/14/2013   Pure hypercholesterolemia 09/25/2013   Type 2 diabetes with complication (HCC) 09/15/1997   Past Medical History:  Diagnosis Date   Arthritis    bilateral hips   Cutaneous abscess of left foot    Deep vein thrombosis (DVT) (HCC)    Diabetes mellitus    type II   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 Date of diagnosis February 08 2014 Duration of anticoagulation: 6 months. End date 08/12/2014.  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 Duration of anticoagulation: 8 months. End date 10/11/2014.  Anticoagulant: Lovenox 120 units daily Switched to Eliquis on 05/25/2014 per patient preference    Pulmonary embolism (HCC)    Sebaceous cyst    on back of neck    Family History  Problem  Relation Age of Onset   Breast cancer Mother    Cancer Mother        small intestine   Liver cancer Mother    Diabetes Mother    Diabetes Father    Diabetes Brother    Hypertension Maternal Grandmother    Heart Problems Maternal Grandmother    Diabetes Paternal Grandmother    Diabetes Paternal Grandfather    Diabetes Brother     Past Surgical History:  Procedure Laterality Date   AMPUTATION Right 06/03/2015   Procedure: Right Below  Knee Amputation;  Surgeon: Nadara Mustard, MD;  Location: [facility] OR;  Service: Orthopedics;  Laterality: Right;   AMPUTATION Left 07/24/2019   Procedure: LEFT FOOT 3RD RAY AMPUTATION;  Surgeon: Nadara Mustard, MD;  Location: [facility] OR;  Service: Orthopedics;  Laterality: Left;   AMPUTATION Left 06/23/2021   Procedure: LEFT 2ND TOE AMPUTATION;  Surgeon: Nadara Mustard, MD;  Location: [facility] OR;  Service: Orthopedics;  Laterality: Left;   AMPUTATION Right 08/21/2021   Procedure: AMPUTATION RIGHT INDEX FINGER;  Surgeon: Gomez Cleverly, MD;  Location: [facility] OR;  Service: Orthopedics;  Laterality: Right;   AMPUTATION Left 09/29/2022   Procedure: AMPUTATION BELOW KNEE;  Surgeon: Nadara Mustard, MD;  Location: [facility] OR;  Service: Orthopedics;  Laterality: Left;   BUBBLE STUDY  01/24/2022   Procedure: BUBBLE STUDY;  Surgeon: Wendall Stade, MD;  Location: [facility] ENDOSCOPY;  Service: Cardiovascular;;   CLOSED REDUCTION WITH HUMER PIN INSERTION  1974   left hip   HARDWARE REMOVAL Left 07/21/2014   Procedure: HARDWARE REMOVAL;  Surgeon: Loreta Ave, MD;  Location: [facility] OR;  Service: Orthopedics;  Laterality: Left;   I & D EXTREMITY Right 08/21/2021   Procedure: IRRIGATION AND DEBRIDEMENT RIGHT INDEX FINGER;  Surgeon: Gomez Cleverly, MD;  Location: [facility] OR;  Service: Orthopedics;  Laterality: Right;   PATENT FORAMEN OVALE(PFO) CLOSURE N/A 02/28/2022   Procedure: PATENT FORAMEN OVALE(PFO) CLOSURE;  Surgeon: Tonny Bollman, MD;  Location: [facility] INVASIVE CV LAB;  Service: Cardiovascular;  Laterality:  N/A;   ROTATOR CUFF REPAIR Right 2005 (approx)   TEE WITHOUT CARDIOVERSION N/A 01/24/2022   Procedure: TRANSESOPHAGEAL ECHOCARDIOGRAM (TEE);  Surgeon: Wendall Stade, MD;  Location: [facility] ENDOSCOPY;  Service: Cardiovascular;  Laterality: N/A;   TOTAL HIP ARTHROPLASTY Left 07/21/2014   Procedure: TOTAL HIP ARTHROPLASTY ANTERIOR APPROACH;  Surgeon: Loreta Ave, MD;  Location: [facility] OR;  Service: Orthopedics;  Laterality: Left;   TOTAL HIP ARTHROPLASTY Right 2006 (approx)   right hip replaced   Social History   Occupational History   Occupation: disabled  Tobacco Use   Smoking status: Never   Smokeless tobacco: Never  Vaping Use   Vaping status: Never Used  Substance and Sexual Activity   Alcohol use: Not Currently    Comment: beer and mixed drink maybe 5  times a month   Drug use: No   Sexual activity: Never

## 2023-07-30 NOTE — Progress Notes (Signed)
Evaluation and management procedures were performed by the Clinical Pharmacy Practitioner under my supervision and collaboration. I have reviewed the Practitioner's note and chart, and I agree with the management and plan as documented above. ° °

## 2023-07-31 DIAGNOSIS — E11622 Type 2 diabetes mellitus with other skin ulcer: Secondary | ICD-10-CM | POA: Diagnosis not present

## 2023-07-31 DIAGNOSIS — H3411 Central retinal artery occlusion, right eye: Secondary | ICD-10-CM | POA: Diagnosis not present

## 2023-07-31 DIAGNOSIS — Z7901 Long term (current) use of anticoagulants: Secondary | ICD-10-CM | POA: Diagnosis not present

## 2023-07-31 DIAGNOSIS — E1142 Type 2 diabetes mellitus with diabetic polyneuropathy: Secondary | ICD-10-CM | POA: Diagnosis not present

## 2023-07-31 DIAGNOSIS — E7800 Pure hypercholesterolemia, unspecified: Secondary | ICD-10-CM | POA: Diagnosis not present

## 2023-07-31 DIAGNOSIS — K760 Fatty (change of) liver, not elsewhere classified: Secondary | ICD-10-CM | POA: Diagnosis not present

## 2023-07-31 DIAGNOSIS — L97821 Non-pressure chronic ulcer of other part of left lower leg limited to breakdown of skin: Secondary | ICD-10-CM | POA: Diagnosis not present

## 2023-07-31 DIAGNOSIS — S81802D Unspecified open wound, left lower leg, subsequent encounter: Secondary | ICD-10-CM | POA: Diagnosis not present

## 2023-07-31 DIAGNOSIS — G4733 Obstructive sleep apnea (adult) (pediatric): Secondary | ICD-10-CM | POA: Diagnosis not present

## 2023-08-01 DIAGNOSIS — Z7901 Long term (current) use of anticoagulants: Secondary | ICD-10-CM | POA: Diagnosis not present

## 2023-08-01 DIAGNOSIS — S81802D Unspecified open wound, left lower leg, subsequent encounter: Secondary | ICD-10-CM | POA: Diagnosis not present

## 2023-08-01 DIAGNOSIS — E7800 Pure hypercholesterolemia, unspecified: Secondary | ICD-10-CM | POA: Diagnosis not present

## 2023-08-01 DIAGNOSIS — E11622 Type 2 diabetes mellitus with other skin ulcer: Secondary | ICD-10-CM | POA: Diagnosis not present

## 2023-08-01 DIAGNOSIS — L97821 Non-pressure chronic ulcer of other part of left lower leg limited to breakdown of skin: Secondary | ICD-10-CM | POA: Diagnosis not present

## 2023-08-01 DIAGNOSIS — E1142 Type 2 diabetes mellitus with diabetic polyneuropathy: Secondary | ICD-10-CM | POA: Diagnosis not present

## 2023-08-01 DIAGNOSIS — H3411 Central retinal artery occlusion, right eye: Secondary | ICD-10-CM | POA: Diagnosis not present

## 2023-08-01 DIAGNOSIS — G4733 Obstructive sleep apnea (adult) (pediatric): Secondary | ICD-10-CM | POA: Diagnosis not present

## 2023-08-01 DIAGNOSIS — K760 Fatty (change of) liver, not elsewhere classified: Secondary | ICD-10-CM | POA: Diagnosis not present

## 2023-08-05 ENCOUNTER — Other Ambulatory Visit: Payer: Self-pay

## 2023-08-05 ENCOUNTER — Telehealth: Payer: Self-pay

## 2023-08-05 DIAGNOSIS — E1165 Type 2 diabetes mellitus with hyperglycemia: Secondary | ICD-10-CM

## 2023-08-05 MED ORDER — METFORMIN HCL ER 750 MG PO TB24: 1500.0000 mg | ORAL_TABLET | Freq: Every day | ORAL | 3 refills | Status: DC

## 2023-08-05 NOTE — Telephone Encounter (Signed)
Sent medication to to mail order pharmacy

## 2023-08-05 NOTE — Telephone Encounter (Signed)
Metformin needs to be sent to [facility] not [facility]. Please resend.

## 2023-08-09 ENCOUNTER — Other Ambulatory Visit: Payer: Self-pay

## 2023-08-15 DIAGNOSIS — Z96643 Presence of artificial hip joint, bilateral: Secondary | ICD-10-CM | POA: Diagnosis not present

## 2023-08-15 DIAGNOSIS — Z7984 Long term (current) use of oral hypoglycemic drugs: Secondary | ICD-10-CM | POA: Diagnosis not present

## 2023-08-15 DIAGNOSIS — L97821 Non-pressure chronic ulcer of other part of left lower leg limited to breakdown of skin: Secondary | ICD-10-CM | POA: Diagnosis not present

## 2023-08-15 DIAGNOSIS — H3411 Central retinal artery occlusion, right eye: Secondary | ICD-10-CM | POA: Diagnosis not present

## 2023-08-15 DIAGNOSIS — Z7901 Long term (current) use of anticoagulants: Secondary | ICD-10-CM | POA: Diagnosis not present

## 2023-08-15 DIAGNOSIS — Z86718 Personal history of other venous thrombosis and embolism: Secondary | ICD-10-CM | POA: Diagnosis not present

## 2023-08-15 DIAGNOSIS — G4733 Obstructive sleep apnea (adult) (pediatric): Secondary | ICD-10-CM | POA: Diagnosis not present

## 2023-08-15 DIAGNOSIS — Z7952 Long term (current) use of systemic steroids: Secondary | ICD-10-CM | POA: Diagnosis not present

## 2023-08-15 DIAGNOSIS — E1142 Type 2 diabetes mellitus with diabetic polyneuropathy: Secondary | ICD-10-CM | POA: Diagnosis not present

## 2023-08-15 DIAGNOSIS — Z9181 History of falling: Secondary | ICD-10-CM | POA: Diagnosis not present

## 2023-08-15 DIAGNOSIS — Z8673 Personal history of transient ischemic attack (TIA), and cerebral infarction without residual deficits: Secondary | ICD-10-CM | POA: Diagnosis not present

## 2023-08-15 DIAGNOSIS — Z89511 Acquired absence of right leg below knee: Secondary | ICD-10-CM | POA: Diagnosis not present

## 2023-08-15 DIAGNOSIS — S81802D Unspecified open wound, left lower leg, subsequent encounter: Secondary | ICD-10-CM | POA: Diagnosis not present

## 2023-08-15 DIAGNOSIS — Z89512 Acquired absence of left leg below knee: Secondary | ICD-10-CM | POA: Diagnosis not present

## 2023-08-15 DIAGNOSIS — E11622 Type 2 diabetes mellitus with other skin ulcer: Secondary | ICD-10-CM | POA: Diagnosis not present

## 2023-08-15 DIAGNOSIS — Z794 Long term (current) use of insulin: Secondary | ICD-10-CM | POA: Diagnosis not present

## 2023-08-15 DIAGNOSIS — K760 Fatty (change of) liver, not elsewhere classified: Secondary | ICD-10-CM | POA: Diagnosis not present

## 2023-08-15 DIAGNOSIS — E7800 Pure hypercholesterolemia, unspecified: Secondary | ICD-10-CM | POA: Diagnosis not present

## 2023-08-16 ENCOUNTER — Other Ambulatory Visit (HOSPITAL_COMMUNITY): Payer: Self-pay

## 2023-08-16 ENCOUNTER — Telehealth: Payer: Self-pay

## 2023-08-16 DIAGNOSIS — S81802D Unspecified open wound, left lower leg, subsequent encounter: Secondary | ICD-10-CM | POA: Diagnosis not present

## 2023-08-16 DIAGNOSIS — Z794 Long term (current) use of insulin: Secondary | ICD-10-CM | POA: Diagnosis not present

## 2023-08-16 DIAGNOSIS — Z89512 Acquired absence of left leg below knee: Secondary | ICD-10-CM | POA: Diagnosis not present

## 2023-08-16 DIAGNOSIS — Z7984 Long term (current) use of oral hypoglycemic drugs: Secondary | ICD-10-CM | POA: Diagnosis not present

## 2023-08-16 DIAGNOSIS — Z89511 Acquired absence of right leg below knee: Secondary | ICD-10-CM | POA: Diagnosis not present

## 2023-08-16 DIAGNOSIS — L97821 Non-pressure chronic ulcer of other part of left lower leg limited to breakdown of skin: Secondary | ICD-10-CM | POA: Diagnosis not present

## 2023-08-16 DIAGNOSIS — H3411 Central retinal artery occlusion, right eye: Secondary | ICD-10-CM | POA: Diagnosis not present

## 2023-08-16 DIAGNOSIS — Z7901 Long term (current) use of anticoagulants: Secondary | ICD-10-CM | POA: Diagnosis not present

## 2023-08-16 DIAGNOSIS — Z9181 History of falling: Secondary | ICD-10-CM | POA: Diagnosis not present

## 2023-08-16 DIAGNOSIS — Z7952 Long term (current) use of systemic steroids: Secondary | ICD-10-CM | POA: Diagnosis not present

## 2023-08-16 DIAGNOSIS — E1142 Type 2 diabetes mellitus with diabetic polyneuropathy: Secondary | ICD-10-CM | POA: Diagnosis not present

## 2023-08-16 DIAGNOSIS — E11622 Type 2 diabetes mellitus with other skin ulcer: Secondary | ICD-10-CM | POA: Diagnosis not present

## 2023-08-16 DIAGNOSIS — K760 Fatty (change of) liver, not elsewhere classified: Secondary | ICD-10-CM | POA: Diagnosis not present

## 2023-08-16 DIAGNOSIS — E7800 Pure hypercholesterolemia, unspecified: Secondary | ICD-10-CM | POA: Diagnosis not present

## 2023-08-16 DIAGNOSIS — Z96643 Presence of artificial hip joint, bilateral: Secondary | ICD-10-CM | POA: Diagnosis not present

## 2023-08-16 DIAGNOSIS — Z8673 Personal history of transient ischemic attack (TIA), and cerebral infarction without residual deficits: Secondary | ICD-10-CM | POA: Diagnosis not present

## 2023-08-16 DIAGNOSIS — G4733 Obstructive sleep apnea (adult) (pediatric): Secondary | ICD-10-CM | POA: Diagnosis not present

## 2023-08-16 DIAGNOSIS — Z86718 Personal history of other venous thrombosis and embolism: Secondary | ICD-10-CM | POA: Diagnosis not present

## 2023-08-19 ENCOUNTER — Other Ambulatory Visit: Payer: Self-pay

## 2023-08-19 ENCOUNTER — Ambulatory Visit: Payer: Medicare Other | Admitting: Pharmacist

## 2023-08-19 ENCOUNTER — Ambulatory Visit: Payer: Self-pay | Admitting: Orthopedic Surgery

## 2023-08-19 DIAGNOSIS — Z7901 Long term (current) use of anticoagulants: Secondary | ICD-10-CM | POA: Diagnosis not present

## 2023-08-19 DIAGNOSIS — L03116 Cellulitis of left lower limb: Secondary | ICD-10-CM

## 2023-08-19 DIAGNOSIS — H349 Unspecified retinal vascular occlusion: Secondary | ICD-10-CM

## 2023-08-19 DIAGNOSIS — H3411 Central retinal artery occlusion, right eye: Secondary | ICD-10-CM | POA: Diagnosis not present

## 2023-08-19 DIAGNOSIS — E1142 Type 2 diabetes mellitus with diabetic polyneuropathy: Secondary | ICD-10-CM

## 2023-08-19 DIAGNOSIS — E782 Mixed hyperlipidemia: Secondary | ICD-10-CM

## 2023-08-19 DIAGNOSIS — Z89512 Acquired absence of left leg below knee: Secondary | ICD-10-CM

## 2023-08-19 LAB — POCT INR: INR: 2 (ref 2.0–3.0)

## 2023-08-19 MED ORDER — GABAPENTIN 300 MG PO CAPS: ORAL_CAPSULE | ORAL | 3 refills | Status: DC

## 2023-08-19 MED ORDER — ROSUVASTATIN CALCIUM 20 MG PO TABS: 20.0000 mg | ORAL_TABLET | Freq: Every day | ORAL | 3 refills | Status: DC

## 2023-08-19 NOTE — Patient Instructions (Signed)
 Patient instructed to take medications as defined in the Anti-coagulation Track section of this encounter.  Patient instructed to take today's dose.  Patient instructed to take  two (2) of your 5 mg peach-colored warfarin tablets, by mouth, once-daily--EXCEPT on MONDAYS and THURSDAYS, take two-and-one-half (2 & 1/2) tablets on Mondays and Thursdays.  Patient verbalized understanding of these instructions. #

## 2023-08-19 NOTE — Progress Notes (Signed)
 Anticoagulation Management Gregory May is a 63 y.o. male who reports to the clinic for monitoring of warfarin treatment.    Indication:  Right arterial occlusion, Central; Right retinal embolism; Long term current use of oral anticoagulation with warfarin, target INR range 2.0 - 3.0..   Duration: indefinite Supervising physician:  Elveria Jacquet, MD  Anticoagulation Clinic Visit History: Patient does not report signs/symptoms of bleeding or thromboembolism  Other recent changes: No diet, medications, lifestyle changes.  Anticoagulation Episode Summary     Current INR goal:  2.0-3.0  TTR:  65.9% (1.3 y)  Next INR check:  09/16/2023  INR from last check:  2.0 (08/19/2023)  Weekly max warfarin dose:  --  Target end date:  --  INR check location:  --  Preferred lab:  --  Send INR reminders to:  --   Indications   Pulmonary emboli (HCC) (Resolved) [I26.99] DVT (deep venous thrombosis) (HCC) (Resolved) [I82.409] Retinal artery occlusion central [H34.10] Right retinal embolus [H34.9] Long term (current) use of anticoagulants [Z79.01]        Comments:  --         No Known Allergies  Current Outpatient Medications:    Accu-Chek FastClix Lancets MISC, Use Accu Chek Fastclix lancets to check blood sugar three times daily. DX:E11.65, Disp: 300 each, Rfl: 2   ACCU-CHEK GUIDE test strip, TEST BLOOD SUGAR THREE TIMES DAILY AS DIRECTED, Disp: 300 strip, Rfl: 3   acetaminophen  (TYLENOL ) 500 MG tablet, Take 1,000 mg by mouth as needed for mild pain or moderate pain., Disp: , Rfl:    APPLE CIDER VINEGAR PO, Take 1 tablet by mouth daily., Disp: , Rfl:    ascorbic acid  (VITAMIN C ) 1000 MG tablet, Take 1 tablet (1,000 mg total) by mouth daily., Disp: , Rfl:    doxycycline  (VIBRA -TABS) 100 MG tablet, Take 1 tablet (100 mg total) by mouth 2 (two) times daily., Disp: 28 tablet, Rfl: 0   empagliflozin  (JARDIANCE ) 25 MG TABS tablet, Take 1 tablet (25 mg total) by mouth daily., Disp: 90  tablet, Rfl: 3   fluticasone  (FLONASE ) 50 MCG/ACT nasal spray, USE 2 SPRAY(S) IN EACH NOSTRIL AT BEDTIME AS NEEDED FOR  SINUS  DRAINAGE, Disp: 16 g, Rfl: 0   gabapentin  (NEURONTIN ) 300 MG capsule, TAKE 1 CAPSULE FOUR TIMES DAILY AS NEEDED, Disp: 360 capsule, Rfl: 3   HYDROcodone -acetaminophen  (NORCO/VICODIN) 5-325 MG tablet, Take 1 tablet by mouth every 6 (six) hours as needed for moderate pain., Disp: 30 tablet, Rfl: 0   insulin  aspart (NOVOLOG ) 100 UNIT/ML injection, Take 5 units with breakfast and 10 units with supper, okay to increase up to 15 units with supper for hyperglycemia., Disp: 10 mL, Rfl: 4   Insulin  Syringes, Disposable, U-100 0.5 ML MISC, Use to inject insulin , Disp: 100 each, Rfl: 2   metFORMIN  (GLUCOPHAGE -XR) 750 MG 24 hr tablet, Take 2 tablets (1,500 mg total) by mouth daily., Disp: 180 tablet, Rfl: 3   Multiple Vitamin (MULTIVITAMIN WITH MINERALS) TABS tablet, Take 1 tablet by mouth every morning. Centrum, Disp: , Rfl:    pioglitazone  (ACTOS ) 30 MG tablet, Take 1 tablet (30 mg total) by mouth daily., Disp: 90 tablet, Rfl: 3   prednisoLONE  acetate (PRED FORTE ) 1 % ophthalmic suspension, Place 1 drop into the right eye every evening., Disp: , Rfl:    rosuvastatin  (CRESTOR ) 20 MG tablet, Take 1 tablet (20 mg total) by mouth daily., Disp: 90 tablet, Rfl: 3   warfarin (COUMADIN ) 5 MG tablet, Take 2 (two)  of your 5 mg peach-colored warfarin tablets on Sundays, Mondays, Wednesdays, Fridays and Saturdays. All OTHER DAYS, (Tuesdays and Thursdays), take two and one-half ( 2 & 1/2 tablets.), Disp: 60 tablet, Rfl: 3   zinc  sulfate 220 (50 Zn) MG capsule, Take 1 capsule (220 mg total) by mouth daily., Disp: , Rfl:  Past Medical History:  Diagnosis Date   Arthritis    bilateral hips   Cutaneous abscess of left foot    Deep vein thrombosis (DVT) (HCC)    Diabetes mellitus    type II   Diabetic ulcer of heel (HCC)    Right heel   DJD (degenerative joint disease)    DVT (deep venous  thrombosis) (HCC) 02/08/2014   Proximal provoked. Date of diagnosis February 08 2014 Duration of anticoagulation: 6 months. End date 08/12/2014.  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 Duration of anticoagulation: 8 months. End date 10/11/2014.  Anticoagulant: Lovenox  120 units daily Switched to Eliquis  on 05/25/2014 per patient preference    Pulmonary embolism (HCC)    Sebaceous cyst    on back of neck   Social History   Socioeconomic History   Marital status: Single    Spouse name: Not on file   Number of children: 0   Years of education: Not on file   Highest education level: Not on file  Occupational History   Occupation: disabled  Tobacco Use   Smoking status: Never   Smokeless tobacco: Never  Vaping Use   Vaping status: Never Used  Substance and Sexual Activity   Alcohol use: Not Currently    Comment: beer and mixed drink maybe 5  times a month   Drug use: No   Sexual activity: Never  Other Topics Concern   Not on file  Social History Narrative   Not on file   Social Drivers of Health   Financial Resource Strain: Low Risk  (01/16/2023)   Overall Financial Resource Strain (CARDIA)    Difficulty of Paying Living Expenses: Not hard at all  Food Insecurity: No Food Insecurity (01/16/2023)   Hunger Vital Sign    Worried About Running Out of Food in the Last Year: Never true    Ran Out of Food in the Last Year: Never true  Transportation Needs: No Transportation Needs (01/16/2023)   PRAPARE - Administrator, Civil Service (Medical): No    Lack of Transportation (Non-Medical): No  Physical Activity:  Inactive (01/16/2023)   Exercise Vital Sign    Days of Exercise per Week: 0 days    Minutes of Exercise per Session: 0 min  Stress: No Stress Concern Present (01/16/2023)   [employer] of Occupational Health - Occupational Stress Questionnaire    Feeling of Stress : Only a little  Social Connections: Socially Isolated (01/16/2023)   Social Connection and Isolation Panel [NHANES]    Frequency of Communication with Friends and Family: More than three times a week    Frequency of Social Gatherings with Friends and Family: Twice a week    Attends Religious Services: Never    Database Administrator or  Organizations: No    Attends Engineer, Structural: Never    Marital Status: Never married   Family History  Problem Relation Age of Onset   Breast cancer Mother    Cancer Mother        small intestine   Liver cancer Mother    Diabetes Mother    Diabetes Father    Diabetes Brother    Hypertension Maternal Grandmother    Heart Problems Maternal Grandmother    Diabetes Paternal Grandmother    Diabetes Paternal Grandfather    Diabetes Brother     ASSESSMENT Recent Results: The most recent result is correlated with 70 mg per week: Lab Results  Component Value Date   INR 2.0 08/19/2023   INR 2.7 07/29/2023   INR 3.3 (A) 07/15/2023    Anticoagulation Dosing: Description   Take two (2) of your 5 mg peach-colored warfarin tablets, by mouth, once-daily--EXCEPT on MONDAYS and THURSDAYS, take two-and-one-half (2 & 1/2) tablets on Mondays and Thursdays.      INR today: Therapeutic  PLAN Weekly dose was increased by 7% to 75 mg per week  Patient Instructions  Patient instructed to take medications as defined in the Anti-coagulation Track section of this encounter.  Patient instructed to take today's dose.  Patient instructed to take two (2) of your 5 mg peach-colored warfarin tablets, by mouth, once-daily--EXCEPT on MONDAYS and THURSDAYS, take two-and-one-half (2 & 1/2)  tablets on Mondays and Thursdays.  Patient verbalized understanding of these instructions.  Patient advised to contact clinic or seek medical attention if signs/symptoms of bleeding or thromboembolism occur.  Patient verbalized understanding by repeating back information and was advised to contact me if further medication-related questions arise. Patient was also provided an information handout.  Follow-up Return in 4 weeks (on 09/16/2023) for Follow up INR.  Gregory May, PharmD, CPP  15 minutes spent face-to-face with the patient during the encounter. 50% of time spent on education, including signs/sx bleeding and clotting, as well as food and drug interactions with warfarin. 50% of time was spent on fingerprick POC INR sample collection,processing, results determination, and documentation in [email or web address].

## 2023-08-21 ENCOUNTER — Other Ambulatory Visit: Payer: Self-pay

## 2023-08-21 DIAGNOSIS — Z7984 Long term (current) use of oral hypoglycemic drugs: Secondary | ICD-10-CM | POA: Diagnosis not present

## 2023-08-21 DIAGNOSIS — Z7952 Long term (current) use of systemic steroids: Secondary | ICD-10-CM | POA: Diagnosis not present

## 2023-08-21 DIAGNOSIS — Z8673 Personal history of transient ischemic attack (TIA), and cerebral infarction without residual deficits: Secondary | ICD-10-CM | POA: Diagnosis not present

## 2023-08-21 DIAGNOSIS — Z9181 History of falling: Secondary | ICD-10-CM | POA: Diagnosis not present

## 2023-08-21 DIAGNOSIS — E1142 Type 2 diabetes mellitus with diabetic polyneuropathy: Secondary | ICD-10-CM | POA: Diagnosis not present

## 2023-08-21 DIAGNOSIS — E11622 Type 2 diabetes mellitus with other skin ulcer: Secondary | ICD-10-CM | POA: Diagnosis not present

## 2023-08-21 DIAGNOSIS — Z89511 Acquired absence of right leg below knee: Secondary | ICD-10-CM | POA: Diagnosis not present

## 2023-08-21 DIAGNOSIS — Z86718 Personal history of other venous thrombosis and embolism: Secondary | ICD-10-CM | POA: Diagnosis not present

## 2023-08-21 DIAGNOSIS — L97821 Non-pressure chronic ulcer of other part of left lower leg limited to breakdown of skin: Secondary | ICD-10-CM | POA: Diagnosis not present

## 2023-08-21 DIAGNOSIS — Z794 Long term (current) use of insulin: Secondary | ICD-10-CM | POA: Diagnosis not present

## 2023-08-21 DIAGNOSIS — E7800 Pure hypercholesterolemia, unspecified: Secondary | ICD-10-CM | POA: Diagnosis not present

## 2023-08-21 DIAGNOSIS — S81802D Unspecified open wound, left lower leg, subsequent encounter: Secondary | ICD-10-CM | POA: Diagnosis not present

## 2023-08-21 DIAGNOSIS — H3411 Central retinal artery occlusion, right eye: Secondary | ICD-10-CM | POA: Diagnosis not present

## 2023-08-21 DIAGNOSIS — K760 Fatty (change of) liver, not elsewhere classified: Secondary | ICD-10-CM | POA: Diagnosis not present

## 2023-08-21 DIAGNOSIS — Z7901 Long term (current) use of anticoagulants: Secondary | ICD-10-CM | POA: Diagnosis not present

## 2023-08-21 DIAGNOSIS — Z96643 Presence of artificial hip joint, bilateral: Secondary | ICD-10-CM | POA: Diagnosis not present

## 2023-08-21 DIAGNOSIS — Z89512 Acquired absence of left leg below knee: Secondary | ICD-10-CM | POA: Diagnosis not present

## 2023-08-21 DIAGNOSIS — G4733 Obstructive sleep apnea (adult) (pediatric): Secondary | ICD-10-CM | POA: Diagnosis not present

## 2023-08-27 ENCOUNTER — Encounter: Payer: Self-pay | Admitting: Orthopedic Surgery

## 2023-08-27 NOTE — Progress Notes (Signed)
 Pharmacy Medication Assistance Program Note    08/27/2023  Patient ID: Gregory May, male   DOB: Apr 06, 1961, 63 y.o.   MRN: [id number]     08/16/2023  [employer]  [employer]  [employer] [facility] [employer]   Type of Radiographer, Therapeutic Assistance  Date Application Sent to Patient 08/27/2023  Application Items Requested Application    Mailed to pt home

## 2023-08-27 NOTE — Progress Notes (Addendum)
 Office Visit Note   Patient: Gregory May           Date of Birth: [birth year]           MRN: [id number] Visit Date: 08/19/2023              Requested by: Fernand Prost, MD [address] [place],  [place] [address] PCP: Fernand Prost, MD  Chief Complaint  Patient presents with   Left Leg - Follow-up    BKA follow up       HPI: Patient is a 63 year old gentleman who is seen in follow-up for left below-knee amputation.  Patient is 11 months out from surgery.  Assessment & Plan: Visit Diagnoses:  1. Left below-knee amputee (HCC)   2. Cellulitis of left lower extremity     Plan: Continue conservative wound care.  Follow-Up Instructions: Return in about 4 weeks (around 09/16/2023).   Ortho Exam  Patient is alert, oriented, no adenopathy, well-dressed, normal affect, normal respiratory effort. Examination patient's leg continues to show improvement.  The circumferential ulcers around the residual limb have healed well.  Patient has 1 residual superficial ulcer over the end of the residual limb that measures 2 x 4 cm is flat with healthy tissue.  The drainage has resolved the cellulitis has resolved.  Patient is a new left transtibial  amputee.  Patient's current comorbidities are not expected to impact the ability to function with the prescribed prosthesis. Patient verbally communicates a strong desire to use a prosthesis. Patient currently requires mobility aids to ambulate without a prosthesis.  Expects not to use mobility aids with a new prosthesis.  Patient is a K3 level ambulator that spends a lot of time walking around on uneven terrain over obstacles, up and down stairs, and ambulates with a variable cadence.     Imaging: No results found. No images are attached to the encounter.  Labs: Lab Results  Component Value Date   HGBA1C 6.4 07/02/2023   HGBA1C 6.6 (A) 03/18/2023   HGBA1C 6.9 (H) 09/24/2022   ESRSEDRATE 11 11/15/2021   ESRSEDRATE 25 (H)  08/19/2021   ESRSEDRATE 8 07/15/2019   CRP 0.7 11/17/2021   CRP <0.5 11/15/2021   CRP 1.7 (H) 08/19/2021   REPTSTATUS 10/03/2022 FINAL 09/28/2022   REPTSTATUS 10/03/2022 FINAL 09/28/2022   GRAMSTAIN  08/21/2021    WBC PRESENT,BOTH PMN AND MONONUCLEAR NO ORGANISMS SEEN    CULT  09/28/2022    NO GROWTH 5 DAYS Performed at [facility], [address], [place], [place] [address]    CULT  09/28/2022    NO GROWTH 5 DAYS Performed at [facility], [address], [place], [place] [address]    [facility] STAPHYLOCOCCUS HOMINIS 08/21/2021     Lab Results  Component Value Date   ALBUMIN 4.2 07/02/2023   ALBUMIN 3.8 12/26/2022   ALBUMIN 2.8 (L) 09/28/2022    Lab Results  Component Value Date   MG 1.9 11/15/2021   Lab Results  Component Value Date   VD25OH 16 (L) 11/05/2013    No results found for: PREALBUMIN    Latest Ref Rng & Units 10/03/2022    2:13 AM 10/01/2022    5:02 AM 09/30/2022    4:06 AM  CBC EXTENDED  WBC 4.0 - 10.5 K/uL 12.8  12.6  14.0   RBC 4.22 - 5.81 MIL/uL 4.26  4.25  3.84   Hemoglobin 13.0 - 17.0 g/dL 86.7  87.1  11.8  HCT 39.0 - 52.0 % 39.1  39.4  36.3   Platelets 150 - 400 K/uL 413  369  325      There is no height or weight on file to calculate BMI.  Orders:  No orders of the defined types were placed in this encounter.  No orders of the defined types were placed in this encounter.    Procedures: No procedures performed  Clinical Data: No additional findings.  ROS:  All other systems negative, except as noted in the HPI. Review of Systems  Objective: Vital Signs: There were no vitals taken for this visit.  Specialty Comments:  No specialty comments available.  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) 10/03/2022   Lower extremity cellulitis 09/29/2022   Acute osteomyelitis of left foot (HCC) 09/29/2022   PFO with atrial  septal aneurysm    Long term (current) use of anticoagulants 02/26/2022   Right retinal embolus 01/15/2022   DM type 2 with diabetic peripheral neuropathy (HCC) 01/15/2022   Obstructive sleep apnea 01/15/2022   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   Transportation insecurity 11/23/2021   Retinal artery occlusion, central 11/15/2021   Epidermal inclusion cyst 10/18/2021   Amputated finger 09/05/2021   Osteomyelitis of finger of right hand (HCC) 08/19/2021   Hepatic steatosis 06/24/2020   Back pain 04/20/2020   Left knee pain 04/20/2020   Allergic sinusitis 04/20/2020   Elevated liver enzymes 04/20/2020   Cutaneous abscess of left foot    Diastasis of rectus abdominis 07/16/2019   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 10/07/2018   Essential hypertension 06/19/2018   Peripheral neuropathy 12/19/2017   Left below-knee amputee (HCC) 04/17/2017   Carpal tunnel syndrome 03/18/2017   Healthcare maintenance 06/21/2015   Status post below knee amputation of right lower extremity (HCC) 06/07/2015   Diabetes mellitus with carpal tunnel syndrome (HCC) 12/09/2014   DJD (degenerative joint disease) of knee 07/21/2014   Osteoarthritis, hip, bilateral 05/25/2014   Onychomycosis 10/14/2013   Pure hypercholesterolemia 09/25/2013   Type 2 diabetes with complication (HCC) 09/15/1997   Past Medical History:  Diagnosis Date   Arthritis    bilateral hips   Cutaneous abscess of left foot    Deep vein thrombosis (DVT) (HCC)    Diabetes mellitus    type II   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 Date of diagnosis February 08 2014 Duration of anticoagulation: 6 months. End date 08/12/2014.  Anticoagulant: Lovenox  120 units daily Switched to Eliquis  on 05/25/2014      Ear drum perforation, right 03/06/2019   Non-pressure chronic ulcer of right calf,  limited to breakdown of skin (HCC) 04/17/2017   Pulmonary emboli (HCC) 02/08/2014   Date of diagnosis February 08 2014, on chest CTA Hospitalized for 3 days Had some symptoms of shortness of breath, and chest pain With intercurrent DVT of the left LE. Duration of anticoagulation: 8 months. End date 10/11/2014.  Anticoagulant: Lovenox  120 units daily Switched to Eliquis  on 05/25/2014 per patient preference    Pulmonary embolism (HCC)    Sebaceous cyst    on back of neck    Family History  Problem Relation Age of Onset   Breast cancer Mother    Cancer Mother  small intestine   Liver cancer Mother    Diabetes Mother    Diabetes Father    Diabetes Brother    Hypertension Maternal Grandmother    Heart Problems Maternal Grandmother    Diabetes Paternal Grandmother    Diabetes Paternal Grandfather    Diabetes Brother     Past Surgical History:  Procedure Laterality Date   AMPUTATION Right 06/03/2015   Procedure: Right Below Knee Amputation;  Surgeon: Jerona Harden GAILS, MD;  Location: [facility] OR;  Service: Orthopedics;  Laterality: Right;   AMPUTATION Left 07/24/2019   Procedure: LEFT FOOT 3RD RAY AMPUTATION;  Surgeon: Harden Jerona GAILS, MD;  Location: [facility] OR;  Service: Orthopedics;  Laterality: Left;   AMPUTATION Left 06/23/2021   Procedure: LEFT 2ND TOE AMPUTATION;  Surgeon: Harden Jerona GAILS, MD;  Location: [facility] OR;  Service: Orthopedics;  Laterality: Left;   AMPUTATION Right 08/21/2021   Procedure: AMPUTATION RIGHT INDEX FINGER;  Surgeon: Alyse Agent, MD;  Location: [facility] OR;  Service: Orthopedics;  Laterality: Right;   AMPUTATION Left 09/29/2022   Procedure: AMPUTATION BELOW KNEE;  Surgeon: Harden Jerona GAILS, MD;  Location: [facility] OR;  Service: Orthopedics;  Laterality: Left;   BUBBLE STUDY  01/24/2022   Procedure: BUBBLE STUDY;  Surgeon: Delford Maude BROCKS, MD;  Location: [facility] ENDOSCOPY;  Service: Cardiovascular;;   CLOSED REDUCTION WITH HUMER PIN INSERTION  1974   left hip   HARDWARE REMOVAL Left 07/21/2014    Procedure: HARDWARE REMOVAL;  Surgeon: Toribio JULIANNA Chancy, MD;  Location: [facility] OR;  Service: Orthopedics;  Laterality: Left;   I & D EXTREMITY Right 08/21/2021   Procedure: IRRIGATION AND DEBRIDEMENT RIGHT INDEX FINGER;  Surgeon: Alyse Agent, MD;  Location: [facility] OR;  Service: Orthopedics;  Laterality: Right;   PATENT FORAMEN OVALE(PFO) CLOSURE N/A 02/28/2022   Procedure: PATENT FORAMEN OVALE(PFO) CLOSURE;  Surgeon: Wonda Sharper, MD;  Location: [facility] INVASIVE CV LAB;  Service: Cardiovascular;  Laterality: N/A;   ROTATOR CUFF REPAIR Right 2005 (approx)   TEE WITHOUT CARDIOVERSION N/A 01/24/2022   Procedure: TRANSESOPHAGEAL ECHOCARDIOGRAM (TEE);  Surgeon: Delford Maude BROCKS, MD;  Location: [facility] ENDOSCOPY;  Service: Cardiovascular;  Laterality: N/A;   TOTAL HIP ARTHROPLASTY Left 07/21/2014   Procedure: TOTAL HIP ARTHROPLASTY ANTERIOR APPROACH;  Surgeon: Toribio JULIANNA Chancy, MD;  Location: [facility] OR;  Service: Orthopedics;  Laterality: Left;   TOTAL HIP ARTHROPLASTY Right 2006 (approx)   right hip replaced   Social History   Occupational History   Occupation: disabled  Tobacco Use   Smoking status: Never   Smokeless tobacco: Never  Vaping Use   Vaping status: Never Used  Substance and Sexual Activity   Alcohol use: Not Currently    Comment: beer and mixed drink maybe 5  times a month   Drug use: No   Sexual activity: Never

## 2023-08-28 DIAGNOSIS — Z86718 Personal history of other venous thrombosis and embolism: Secondary | ICD-10-CM | POA: Diagnosis not present

## 2023-08-28 DIAGNOSIS — Z4781 Encounter for orthopedic aftercare following surgical amputation: Secondary | ICD-10-CM | POA: Diagnosis not present

## 2023-08-28 DIAGNOSIS — L97821 Non-pressure chronic ulcer of other part of left lower leg limited to breakdown of skin: Secondary | ICD-10-CM | POA: Diagnosis not present

## 2023-08-28 DIAGNOSIS — Z794 Long term (current) use of insulin: Secondary | ICD-10-CM | POA: Diagnosis not present

## 2023-08-28 DIAGNOSIS — R2681 Unsteadiness on feet: Secondary | ICD-10-CM | POA: Diagnosis not present

## 2023-08-28 DIAGNOSIS — Z7984 Long term (current) use of oral hypoglycemic drugs: Secondary | ICD-10-CM | POA: Diagnosis not present

## 2023-08-28 DIAGNOSIS — E11622 Type 2 diabetes mellitus with other skin ulcer: Secondary | ICD-10-CM | POA: Diagnosis not present

## 2023-08-28 DIAGNOSIS — H3411 Central retinal artery occlusion, right eye: Secondary | ICD-10-CM | POA: Diagnosis not present

## 2023-08-28 DIAGNOSIS — Z8673 Personal history of transient ischemic attack (TIA), and cerebral infarction without residual deficits: Secondary | ICD-10-CM | POA: Diagnosis not present

## 2023-08-28 DIAGNOSIS — K760 Fatty (change of) liver, not elsewhere classified: Secondary | ICD-10-CM | POA: Diagnosis not present

## 2023-08-28 DIAGNOSIS — Z9181 History of falling: Secondary | ICD-10-CM | POA: Diagnosis not present

## 2023-08-28 DIAGNOSIS — Z7901 Long term (current) use of anticoagulants: Secondary | ICD-10-CM | POA: Diagnosis not present

## 2023-08-28 DIAGNOSIS — Z96643 Presence of artificial hip joint, bilateral: Secondary | ICD-10-CM | POA: Diagnosis not present

## 2023-08-28 DIAGNOSIS — E7800 Pure hypercholesterolemia, unspecified: Secondary | ICD-10-CM | POA: Diagnosis not present

## 2023-08-28 DIAGNOSIS — G4733 Obstructive sleep apnea (adult) (pediatric): Secondary | ICD-10-CM | POA: Diagnosis not present

## 2023-08-28 DIAGNOSIS — M160 Bilateral primary osteoarthritis of hip: Secondary | ICD-10-CM | POA: Diagnosis not present

## 2023-08-28 DIAGNOSIS — M6281 Muscle weakness (generalized): Secondary | ICD-10-CM | POA: Diagnosis not present

## 2023-08-28 DIAGNOSIS — Z89511 Acquired absence of right leg below knee: Secondary | ICD-10-CM | POA: Diagnosis not present

## 2023-08-28 DIAGNOSIS — E1142 Type 2 diabetes mellitus with diabetic polyneuropathy: Secondary | ICD-10-CM | POA: Diagnosis not present

## 2023-08-28 DIAGNOSIS — Z89512 Acquired absence of left leg below knee: Secondary | ICD-10-CM | POA: Diagnosis not present

## 2023-09-04 DIAGNOSIS — Z7984 Long term (current) use of oral hypoglycemic drugs: Secondary | ICD-10-CM | POA: Diagnosis not present

## 2023-09-04 DIAGNOSIS — L97821 Non-pressure chronic ulcer of other part of left lower leg limited to breakdown of skin: Secondary | ICD-10-CM | POA: Diagnosis not present

## 2023-09-04 DIAGNOSIS — E7800 Pure hypercholesterolemia, unspecified: Secondary | ICD-10-CM | POA: Diagnosis not present

## 2023-09-04 DIAGNOSIS — Z7901 Long term (current) use of anticoagulants: Secondary | ICD-10-CM | POA: Diagnosis not present

## 2023-09-04 DIAGNOSIS — E1142 Type 2 diabetes mellitus with diabetic polyneuropathy: Secondary | ICD-10-CM | POA: Diagnosis not present

## 2023-09-04 DIAGNOSIS — K760 Fatty (change of) liver, not elsewhere classified: Secondary | ICD-10-CM | POA: Diagnosis not present

## 2023-09-04 DIAGNOSIS — G4733 Obstructive sleep apnea (adult) (pediatric): Secondary | ICD-10-CM | POA: Diagnosis not present

## 2023-09-04 DIAGNOSIS — Z9181 History of falling: Secondary | ICD-10-CM | POA: Diagnosis not present

## 2023-09-04 DIAGNOSIS — E11622 Type 2 diabetes mellitus with other skin ulcer: Secondary | ICD-10-CM | POA: Diagnosis not present

## 2023-09-04 DIAGNOSIS — Z794 Long term (current) use of insulin: Secondary | ICD-10-CM | POA: Diagnosis not present

## 2023-09-04 DIAGNOSIS — Z86718 Personal history of other venous thrombosis and embolism: Secondary | ICD-10-CM | POA: Diagnosis not present

## 2023-09-04 DIAGNOSIS — Z96643 Presence of artificial hip joint, bilateral: Secondary | ICD-10-CM | POA: Diagnosis not present

## 2023-09-04 DIAGNOSIS — Z89512 Acquired absence of left leg below knee: Secondary | ICD-10-CM | POA: Diagnosis not present

## 2023-09-04 DIAGNOSIS — Z89511 Acquired absence of right leg below knee: Secondary | ICD-10-CM | POA: Diagnosis not present

## 2023-09-04 DIAGNOSIS — Z8673 Personal history of transient ischemic attack (TIA), and cerebral infarction without residual deficits: Secondary | ICD-10-CM | POA: Diagnosis not present

## 2023-09-04 DIAGNOSIS — H3411 Central retinal artery occlusion, right eye: Secondary | ICD-10-CM | POA: Diagnosis not present

## 2023-09-05 ENCOUNTER — Telehealth: Payer: Self-pay

## 2023-09-05 NOTE — Telephone Encounter (Signed)
Patient call returned. Patient left message inquiring about "some papers" prior to his next visit. Call placed to clarify.

## 2023-09-06 ENCOUNTER — Other Ambulatory Visit: Payer: Self-pay

## 2023-09-06 DIAGNOSIS — E1165 Type 2 diabetes mellitus with hyperglycemia: Secondary | ICD-10-CM

## 2023-09-06 MED ORDER — PIOGLITAZONE HCL 30 MG PO TABS: 30.0000 mg | ORAL_TABLET | Freq: Every day | ORAL | 3 refills | Status: DC

## 2023-09-11 ENCOUNTER — Encounter (HOSPITAL_BASED_OUTPATIENT_CLINIC_OR_DEPARTMENT_OTHER): Payer: Medicare Other | Attending: General Surgery | Admitting: General Surgery

## 2023-09-11 DIAGNOSIS — L97822 Non-pressure chronic ulcer of other part of left lower leg with fat layer exposed: Secondary | ICD-10-CM | POA: Insufficient documentation

## 2023-09-11 DIAGNOSIS — Z89512 Acquired absence of left leg below knee: Secondary | ICD-10-CM | POA: Diagnosis not present

## 2023-09-11 DIAGNOSIS — E11622 Type 2 diabetes mellitus with other skin ulcer: Secondary | ICD-10-CM | POA: Insufficient documentation

## 2023-09-11 DIAGNOSIS — Z89511 Acquired absence of right leg below knee: Secondary | ICD-10-CM | POA: Diagnosis not present

## 2023-09-13 DIAGNOSIS — L97821 Non-pressure chronic ulcer of other part of left lower leg limited to breakdown of skin: Secondary | ICD-10-CM | POA: Diagnosis not present

## 2023-09-13 DIAGNOSIS — Z9181 History of falling: Secondary | ICD-10-CM | POA: Diagnosis not present

## 2023-09-13 DIAGNOSIS — G4733 Obstructive sleep apnea (adult) (pediatric): Secondary | ICD-10-CM | POA: Diagnosis not present

## 2023-09-13 DIAGNOSIS — Z89511 Acquired absence of right leg below knee: Secondary | ICD-10-CM | POA: Diagnosis not present

## 2023-09-13 DIAGNOSIS — E7800 Pure hypercholesterolemia, unspecified: Secondary | ICD-10-CM | POA: Diagnosis not present

## 2023-09-13 DIAGNOSIS — K760 Fatty (change of) liver, not elsewhere classified: Secondary | ICD-10-CM | POA: Diagnosis not present

## 2023-09-13 DIAGNOSIS — E11622 Type 2 diabetes mellitus with other skin ulcer: Secondary | ICD-10-CM | POA: Diagnosis not present

## 2023-09-13 DIAGNOSIS — H3411 Central retinal artery occlusion, right eye: Secondary | ICD-10-CM | POA: Diagnosis not present

## 2023-09-13 DIAGNOSIS — Z7984 Long term (current) use of oral hypoglycemic drugs: Secondary | ICD-10-CM | POA: Diagnosis not present

## 2023-09-13 DIAGNOSIS — Z8673 Personal history of transient ischemic attack (TIA), and cerebral infarction without residual deficits: Secondary | ICD-10-CM | POA: Diagnosis not present

## 2023-09-13 DIAGNOSIS — Z89512 Acquired absence of left leg below knee: Secondary | ICD-10-CM | POA: Diagnosis not present

## 2023-09-13 DIAGNOSIS — Z7901 Long term (current) use of anticoagulants: Secondary | ICD-10-CM | POA: Diagnosis not present

## 2023-09-13 DIAGNOSIS — Z86718 Personal history of other venous thrombosis and embolism: Secondary | ICD-10-CM | POA: Diagnosis not present

## 2023-09-13 DIAGNOSIS — Z96643 Presence of artificial hip joint, bilateral: Secondary | ICD-10-CM | POA: Diagnosis not present

## 2023-09-13 DIAGNOSIS — E1142 Type 2 diabetes mellitus with diabetic polyneuropathy: Secondary | ICD-10-CM | POA: Diagnosis not present

## 2023-09-13 DIAGNOSIS — Z794 Long term (current) use of insulin: Secondary | ICD-10-CM | POA: Diagnosis not present

## 2023-09-16 ENCOUNTER — Ambulatory Visit (INDEPENDENT_AMBULATORY_CARE_PROVIDER_SITE_OTHER): Payer: Medicare Other | Admitting: Pharmacist

## 2023-09-16 ENCOUNTER — Ambulatory Visit: Payer: Medicare Other | Admitting: Orthopedic Surgery

## 2023-09-16 DIAGNOSIS — Z7901 Long term (current) use of anticoagulants: Secondary | ICD-10-CM | POA: Diagnosis not present

## 2023-09-16 DIAGNOSIS — H3410 Central retinal artery occlusion, unspecified eye: Secondary | ICD-10-CM

## 2023-09-16 DIAGNOSIS — H349 Unspecified retinal vascular occlusion: Secondary | ICD-10-CM

## 2023-09-16 LAB — POCT INR: INR: 2.6 (ref 2.0–3.0)

## 2023-09-16 NOTE — Progress Notes (Cosign Needed)
Anticoagulation Management Gregory May is a 63 y.o. male who reports to the clinic for monitoring of warfarin treatment.    Indication:  Retinal artery occlusion, Central; Right retinal embolism; Long term current use of oral anticoagulation with warfarin to maintain INR 2.0 - 3.0.    Duration: indefinite Supervising physician:  Dickie La, MD  Anticoagulation Clinic Visit History: Patient does not report signs/symptoms of bleeding or thromboembolism  Other recent changes: No diet, medications, lifestyle changes cited by the patient at this visit.  Anticoagulation Episode Summary     Current INR goal:  2.0-3.0  TTR:  67.8% (1.4 y)  Next INR check:  10/07/2023  INR from last check:  2.6 (09/16/2023)  Weekly max warfarin dose:  --  Target end date:  --  INR check location:  --  Preferred lab:  --  Send INR reminders to:  --   Indications   Pulmonary emboli (HCC) (Resolved) [I26.99] DVT (deep venous thrombosis) (HCC) (Resolved) [I82.409] Retinal artery occlusion central [H34.10] Right retinal embolus [H34.9] Long term (current) use of anticoagulants [Z79.01]        Comments:  --         No Known Allergies  Current Outpatient Medications:    Accu-Chek FastClix Lancets MISC, Use Accu Chek Fastclix lancets to check blood sugar three times daily. DX:E11.65, Disp: 300 each, Rfl: 2   ACCU-CHEK GUIDE test strip, TEST BLOOD SUGAR THREE TIMES DAILY AS DIRECTED, Disp: 300 strip, Rfl: 3   acetaminophen (TYLENOL) 500 MG tablet, Take 1,000 mg by mouth as needed for mild pain or moderate pain., Disp: , Rfl:    APPLE CIDER VINEGAR PO, Take 1 tablet by mouth daily., Disp: , Rfl:    ascorbic acid (VITAMIN C) 1000 MG tablet, Take 1 tablet (1,000 mg total) by mouth daily., Disp: , Rfl:    empagliflozin (JARDIANCE) 25 MG TABS tablet, Take 1 tablet (25 mg total) by mouth daily., Disp: 90 tablet, Rfl: 3   fluticasone (FLONASE) 50 MCG/ACT nasal spray, USE 2 SPRAY(S) IN EACH NOSTRIL AT  BEDTIME AS NEEDED FOR  SINUS  DRAINAGE, Disp: 16 g, Rfl: 0   gabapentin (NEURONTIN) 300 MG capsule, TAKE 1 CAPSULE FOUR TIMES DAILY AS NEEDED, Disp: 360 capsule, Rfl: 3   HYDROcodone-acetaminophen (NORCO/VICODIN) 5-325 MG tablet, Take 1 tablet by mouth every 6 (six) hours as needed for moderate pain., Disp: 30 tablet, Rfl: 0   insulin aspart (NOVOLOG) 100 UNIT/ML injection, Take 5 units with breakfast and 10 units with supper, okay to increase up to 15 units with supper for hyperglycemia., Disp: 10 mL, Rfl: 4   Insulin Syringes, Disposable, U-100 0.5 ML MISC, Use to inject insulin, Disp: 100 each, Rfl: 2   metFORMIN (GLUCOPHAGE-XR) 750 MG 24 hr tablet, Take 2 tablets (1,500 mg total) by mouth daily., Disp: 180 tablet, Rfl: 3   Multiple Vitamin (MULTIVITAMIN WITH MINERALS) TABS tablet, Take 1 tablet by mouth every morning. Centrum, Disp: , Rfl:    pioglitazone (ACTOS) 30 MG tablet, Take 1 tablet (30 mg total) by mouth daily., Disp: 90 tablet, Rfl: 3   prednisoLONE acetate (PRED FORTE) 1 % ophthalmic suspension, Place 1 drop into the right eye every evening., Disp: , Rfl:    rosuvastatin (CRESTOR) 20 MG tablet, Take 1 tablet (20 mg total) by mouth daily., Disp: 90 tablet, Rfl: 3   warfarin (COUMADIN) 5 MG tablet, Take 2 (two) of your 5 mg peach-colored warfarin tablets on Sundays, Mondays, Wednesdays, Fridays and Saturdays. All OTHER  DAYS, (Tuesdays and Thursdays), take two and one-half ( 2 & 1/2 tablets.), Disp: 60 tablet, Rfl: 3   zinc sulfate 220 (50 Zn) MG capsule, Take 1 capsule (220 mg total) by mouth daily., Disp: , Rfl:    doxycycline (VIBRA-TABS) 100 MG tablet, Take 1 tablet (100 mg total) by mouth 2 (two) times daily. (Patient not taking: Reported on 09/16/2023), Disp: 28 tablet, Rfl: 0 Past Medical History:  Diagnosis Date   Arthritis    bilateral hips   Cutaneous abscess of left foot    Deep vein thrombosis (DVT) (HCC)    Diabetes mellitus    type II   Diabetic ulcer of heel (HCC)     Right heel   DJD (degenerative joint disease)    DVT (deep venous thrombosis) (HCC) 02/08/2014   Proximal provoked. Date of diagnosis February 08 2014 Duration of anticoagulation: 6 months. End date 08/12/2014.  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 Duration of anticoagulation: 8 months. End date 10/11/2014.  Anticoagulant: Lovenox 120 units daily Switched to Eliquis on 05/25/2014 per patient preference    Pulmonary embolism (HCC)    Sebaceous cyst    on back of neck   Social History   Socioeconomic History   Marital status: Single    Spouse name: Not on file   Number of children: 0   Years of education: Not on file   Highest education level: Not on file  Occupational History   Occupation: disabled  Tobacco Use   Smoking status: Never   Smokeless tobacco: Never  Vaping Use   Vaping status: Never Used  Substance and Sexual Activity   Alcohol use: Not Currently    Comment: beer and mixed drink maybe 5  times a month   Drug use: No   Sexual activity: Never  Other Topics Concern   Not on file  Social History Narrative   Not on file   Social Drivers of Health   Financial Resource Strain: Low Risk  (01/16/2023)   Overall Financial Resource Strain (CARDIA)    Difficulty of Paying Living Expenses: Not hard at all  Food Insecurity: No Food Insecurity (01/16/2023)   Hunger Vital Sign    Worried About Running Out of Food in the Last Year: Never true    Ran Out of Food in the Last Year: Never true  Transportation Needs: No Transportation Needs (01/16/2023)   PRAPARE - Administrator, Civil Service (Medical): No     Lack of Transportation (Non-Medical): No  Physical Activity: Inactive (01/16/2023)   Exercise Vital Sign    Days of Exercise per Week: 0 days    Minutes of Exercise per Session: 0 min  Stress: No Stress Concern Present (01/16/2023)   [employer] of Occupational Health - Occupational Stress Questionnaire    Feeling of Stress : Only a little  Social Connections: Socially Isolated (01/16/2023)   Social Connection and Isolation Panel [NHANES]    Frequency of Communication with Friends and Family: More than three times a week    Frequency of Social Gatherings with Friends and Family: Twice a week  Attends Religious Services: Never    Active Member of Clubs or Organizations: No    Attends Banker Meetings: Never    Marital Status: Never married   Family History  Problem Relation Age of Onset   Breast cancer Mother    Cancer Mother        small intestine   Liver cancer Mother    Diabetes Mother    Diabetes Father    Diabetes Brother    Hypertension Maternal Grandmother    Heart Problems Maternal Grandmother    Diabetes Paternal Grandmother    Diabetes Paternal Grandfather    Diabetes Brother     ASSESSMENT Recent Results: The most recent result is correlated with 75 mg per week: Lab Results  Component Value Date   INR 2.6 09/16/2023   INR 2.0 08/19/2023   INR 2.7 07/29/2023    Anticoagulation Dosing: Description   Take two (2) of your 5 mg peach-colored warfarin tablets, by mouth, once-daily--EXCEPT on MONDAYS and THURSDAYS, take two-and-one-half (2 & 1/2) tablets on Mondays and Thursdays.      INR today: Therapeutic  PLAN Weekly dose was unchanged.   Patient Instructions  Patient instructed to take medications as defined in the Anti-coagulation Track section of this encounter.  Patient instructed to take today's dose.  Patient instructed to take  two (2) of your 5 mg peach-colored warfarin tablets, by mouth, once-daily--EXCEPT on MONDAYS and THURSDAYS,  take two-and-one-half (2 & 1/2) tablets on Mondays and Thursdays.  Patient verbalized understanding of these instructions. # Patient advised to contact clinic or seek medical attention if signs/symptoms of bleeding or thromboembolism occur.  Patient verbalized understanding by repeating back information and was advised to contact me if further medication-related questions arise. Patient was also provided an information handout.  Follow-up Return in 3 weeks (on 10/07/2023) for Follow up INR.  Elicia Lamp, PharmD, CPP  15 minutes spent face-to-face with the patient during the encounter. 50% of time spent on education, including signs/sx bleeding and clotting, as well as food and drug interactions with warfarin. 50% of time was spent on fingerprick POC INR sample collection,processing, results determination, and documentation in [email or web address].

## 2023-09-16 NOTE — Patient Instructions (Signed)
Patient instructed to take medications as defined in the Anti-coagulation Track section of this encounter.  Patient instructed to take today's dose.  Patient instructed to take  two (2) of your 5 mg peach-colored warfarin tablets, by mouth, once-daily--EXCEPT on MONDAYS and THURSDAYS, take two-and-one-half (2 & 1/2) tablets on Mondays and Thursdays.  Patient verbalized understanding of these instructions. #

## 2023-09-18 DIAGNOSIS — Z7901 Long term (current) use of anticoagulants: Secondary | ICD-10-CM | POA: Diagnosis not present

## 2023-09-18 DIAGNOSIS — E1142 Type 2 diabetes mellitus with diabetic polyneuropathy: Secondary | ICD-10-CM | POA: Diagnosis not present

## 2023-09-18 DIAGNOSIS — Z7984 Long term (current) use of oral hypoglycemic drugs: Secondary | ICD-10-CM | POA: Diagnosis not present

## 2023-09-18 DIAGNOSIS — Z89512 Acquired absence of left leg below knee: Secondary | ICD-10-CM | POA: Diagnosis not present

## 2023-09-18 DIAGNOSIS — E11622 Type 2 diabetes mellitus with other skin ulcer: Secondary | ICD-10-CM | POA: Diagnosis not present

## 2023-09-18 DIAGNOSIS — Z86718 Personal history of other venous thrombosis and embolism: Secondary | ICD-10-CM | POA: Diagnosis not present

## 2023-09-18 DIAGNOSIS — Z9181 History of falling: Secondary | ICD-10-CM | POA: Diagnosis not present

## 2023-09-18 DIAGNOSIS — L97821 Non-pressure chronic ulcer of other part of left lower leg limited to breakdown of skin: Secondary | ICD-10-CM | POA: Diagnosis not present

## 2023-09-18 DIAGNOSIS — K760 Fatty (change of) liver, not elsewhere classified: Secondary | ICD-10-CM | POA: Diagnosis not present

## 2023-09-18 DIAGNOSIS — Z8673 Personal history of transient ischemic attack (TIA), and cerebral infarction without residual deficits: Secondary | ICD-10-CM | POA: Diagnosis not present

## 2023-09-18 DIAGNOSIS — E7800 Pure hypercholesterolemia, unspecified: Secondary | ICD-10-CM | POA: Diagnosis not present

## 2023-09-18 DIAGNOSIS — Z96643 Presence of artificial hip joint, bilateral: Secondary | ICD-10-CM | POA: Diagnosis not present

## 2023-09-18 DIAGNOSIS — G4733 Obstructive sleep apnea (adult) (pediatric): Secondary | ICD-10-CM | POA: Diagnosis not present

## 2023-09-18 DIAGNOSIS — H3411 Central retinal artery occlusion, right eye: Secondary | ICD-10-CM | POA: Diagnosis not present

## 2023-09-18 DIAGNOSIS — Z89511 Acquired absence of right leg below knee: Secondary | ICD-10-CM | POA: Diagnosis not present

## 2023-09-18 DIAGNOSIS — Z794 Long term (current) use of insulin: Secondary | ICD-10-CM | POA: Diagnosis not present

## 2023-09-20 ENCOUNTER — Encounter (HOSPITAL_BASED_OUTPATIENT_CLINIC_OR_DEPARTMENT_OTHER): Payer: Medicare Other | Attending: General Surgery | Admitting: General Surgery

## 2023-09-20 DIAGNOSIS — Z89511 Acquired absence of right leg below knee: Secondary | ICD-10-CM | POA: Insufficient documentation

## 2023-09-20 DIAGNOSIS — E11622 Type 2 diabetes mellitus with other skin ulcer: Secondary | ICD-10-CM | POA: Diagnosis not present

## 2023-09-20 DIAGNOSIS — Z89512 Acquired absence of left leg below knee: Secondary | ICD-10-CM | POA: Diagnosis not present

## 2023-09-20 DIAGNOSIS — L97822 Non-pressure chronic ulcer of other part of left lower leg with fat layer exposed: Secondary | ICD-10-CM | POA: Insufficient documentation

## 2023-09-25 DIAGNOSIS — E7800 Pure hypercholesterolemia, unspecified: Secondary | ICD-10-CM | POA: Diagnosis not present

## 2023-09-25 DIAGNOSIS — Z794 Long term (current) use of insulin: Secondary | ICD-10-CM | POA: Diagnosis not present

## 2023-09-25 DIAGNOSIS — G4733 Obstructive sleep apnea (adult) (pediatric): Secondary | ICD-10-CM | POA: Diagnosis not present

## 2023-09-25 DIAGNOSIS — Z8673 Personal history of transient ischemic attack (TIA), and cerebral infarction without residual deficits: Secondary | ICD-10-CM | POA: Diagnosis not present

## 2023-09-25 DIAGNOSIS — E11622 Type 2 diabetes mellitus with other skin ulcer: Secondary | ICD-10-CM | POA: Diagnosis not present

## 2023-09-25 DIAGNOSIS — Z7901 Long term (current) use of anticoagulants: Secondary | ICD-10-CM | POA: Diagnosis not present

## 2023-09-25 DIAGNOSIS — E1142 Type 2 diabetes mellitus with diabetic polyneuropathy: Secondary | ICD-10-CM | POA: Diagnosis not present

## 2023-09-25 DIAGNOSIS — K760 Fatty (change of) liver, not elsewhere classified: Secondary | ICD-10-CM | POA: Diagnosis not present

## 2023-09-25 DIAGNOSIS — Z7984 Long term (current) use of oral hypoglycemic drugs: Secondary | ICD-10-CM | POA: Diagnosis not present

## 2023-09-25 DIAGNOSIS — Z96643 Presence of artificial hip joint, bilateral: Secondary | ICD-10-CM | POA: Diagnosis not present

## 2023-09-25 DIAGNOSIS — Z89512 Acquired absence of left leg below knee: Secondary | ICD-10-CM | POA: Diagnosis not present

## 2023-09-25 DIAGNOSIS — Z9181 History of falling: Secondary | ICD-10-CM | POA: Diagnosis not present

## 2023-09-25 DIAGNOSIS — Z86718 Personal history of other venous thrombosis and embolism: Secondary | ICD-10-CM | POA: Diagnosis not present

## 2023-09-25 DIAGNOSIS — H3411 Central retinal artery occlusion, right eye: Secondary | ICD-10-CM | POA: Diagnosis not present

## 2023-09-25 DIAGNOSIS — Z89511 Acquired absence of right leg below knee: Secondary | ICD-10-CM | POA: Diagnosis not present

## 2023-09-25 DIAGNOSIS — L97821 Non-pressure chronic ulcer of other part of left lower leg limited to breakdown of skin: Secondary | ICD-10-CM | POA: Diagnosis not present

## 2023-09-26 ENCOUNTER — Other Ambulatory Visit: Payer: Self-pay | Admitting: Pharmacist

## 2023-09-26 MED ORDER — WARFARIN SODIUM 5 MG PO TABS: ORAL_TABLET | ORAL | 3 refills | Status: DC

## 2023-09-28 DIAGNOSIS — M160 Bilateral primary osteoarthritis of hip: Secondary | ICD-10-CM | POA: Diagnosis not present

## 2023-09-28 DIAGNOSIS — R2681 Unsteadiness on feet: Secondary | ICD-10-CM | POA: Diagnosis not present

## 2023-09-28 DIAGNOSIS — Z89512 Acquired absence of left leg below knee: Secondary | ICD-10-CM | POA: Diagnosis not present

## 2023-09-28 DIAGNOSIS — M6281 Muscle weakness (generalized): Secondary | ICD-10-CM | POA: Diagnosis not present

## 2023-09-28 DIAGNOSIS — Z4781 Encounter for orthopedic aftercare following surgical amputation: Secondary | ICD-10-CM | POA: Diagnosis not present

## 2023-09-30 ENCOUNTER — Encounter (HOSPITAL_BASED_OUTPATIENT_CLINIC_OR_DEPARTMENT_OTHER): Payer: Medicare Other | Admitting: General Surgery

## 2023-09-30 DIAGNOSIS — Z89512 Acquired absence of left leg below knee: Secondary | ICD-10-CM | POA: Diagnosis not present

## 2023-09-30 DIAGNOSIS — Z89511 Acquired absence of right leg below knee: Secondary | ICD-10-CM | POA: Diagnosis not present

## 2023-09-30 DIAGNOSIS — L97822 Non-pressure chronic ulcer of other part of left lower leg with fat layer exposed: Secondary | ICD-10-CM | POA: Diagnosis not present

## 2023-09-30 DIAGNOSIS — E11622 Type 2 diabetes mellitus with other skin ulcer: Secondary | ICD-10-CM | POA: Diagnosis not present

## 2023-10-01 ENCOUNTER — Other Ambulatory Visit: Payer: Medicare Other

## 2023-10-01 DIAGNOSIS — E1165 Type 2 diabetes mellitus with hyperglycemia: Secondary | ICD-10-CM | POA: Diagnosis not present

## 2023-10-01 DIAGNOSIS — Z794 Long term (current) use of insulin: Secondary | ICD-10-CM | POA: Diagnosis not present

## 2023-10-02 ENCOUNTER — Other Ambulatory Visit: Payer: Medicare HMO

## 2023-10-02 LAB — BASIC METABOLIC PANEL
BUN: 12 mg/dL (ref 7–25)
CO2: 28 mmol/L (ref 20–32)
Calcium: 9.1 mg/dL (ref 8.6–10.3)
Chloride: 102 mmol/L (ref 98–110)
Creat: 0.7 mg/dL (ref 0.70–1.35)
Glucose, Bld: 119 mg/dL — ABNORMAL HIGH (ref 65–99)
Potassium: 4.3 mmol/L (ref 3.5–5.3)
Sodium: 139 mmol/L (ref 135–146)

## 2023-10-02 LAB — HEMOGLOBIN A1C
Hgb A1c MFr Bld: 6.8 %{Hb} — ABNORMAL HIGH (ref <5.7)
Mean Plasma Glucose: 148 mg/dL
eAG (mmol/L): 8.2 mmol/L

## 2023-10-04 DIAGNOSIS — E7800 Pure hypercholesterolemia, unspecified: Secondary | ICD-10-CM | POA: Diagnosis not present

## 2023-10-04 DIAGNOSIS — G4733 Obstructive sleep apnea (adult) (pediatric): Secondary | ICD-10-CM | POA: Diagnosis not present

## 2023-10-04 DIAGNOSIS — E11622 Type 2 diabetes mellitus with other skin ulcer: Secondary | ICD-10-CM | POA: Diagnosis not present

## 2023-10-04 DIAGNOSIS — Z86718 Personal history of other venous thrombosis and embolism: Secondary | ICD-10-CM | POA: Diagnosis not present

## 2023-10-04 DIAGNOSIS — H3411 Central retinal artery occlusion, right eye: Secondary | ICD-10-CM | POA: Diagnosis not present

## 2023-10-04 DIAGNOSIS — K760 Fatty (change of) liver, not elsewhere classified: Secondary | ICD-10-CM | POA: Diagnosis not present

## 2023-10-04 DIAGNOSIS — Z794 Long term (current) use of insulin: Secondary | ICD-10-CM | POA: Diagnosis not present

## 2023-10-04 DIAGNOSIS — Z7901 Long term (current) use of anticoagulants: Secondary | ICD-10-CM | POA: Diagnosis not present

## 2023-10-04 DIAGNOSIS — E1142 Type 2 diabetes mellitus with diabetic polyneuropathy: Secondary | ICD-10-CM | POA: Diagnosis not present

## 2023-10-04 DIAGNOSIS — Z96643 Presence of artificial hip joint, bilateral: Secondary | ICD-10-CM | POA: Diagnosis not present

## 2023-10-04 DIAGNOSIS — Z89511 Acquired absence of right leg below knee: Secondary | ICD-10-CM | POA: Diagnosis not present

## 2023-10-04 DIAGNOSIS — Z7984 Long term (current) use of oral hypoglycemic drugs: Secondary | ICD-10-CM | POA: Diagnosis not present

## 2023-10-04 DIAGNOSIS — Z8673 Personal history of transient ischemic attack (TIA), and cerebral infarction without residual deficits: Secondary | ICD-10-CM | POA: Diagnosis not present

## 2023-10-04 DIAGNOSIS — Z9181 History of falling: Secondary | ICD-10-CM | POA: Diagnosis not present

## 2023-10-04 DIAGNOSIS — Z89512 Acquired absence of left leg below knee: Secondary | ICD-10-CM | POA: Diagnosis not present

## 2023-10-04 DIAGNOSIS — L97821 Non-pressure chronic ulcer of other part of left lower leg limited to breakdown of skin: Secondary | ICD-10-CM | POA: Diagnosis not present

## 2023-10-07 ENCOUNTER — Ambulatory Visit: Payer: Medicare Other

## 2023-10-09 ENCOUNTER — Encounter: Payer: Self-pay | Admitting: Endocrinology

## 2023-10-09 ENCOUNTER — Ambulatory Visit: Payer: Medicare Other | Admitting: Pharmacist

## 2023-10-09 ENCOUNTER — Ambulatory Visit (INDEPENDENT_AMBULATORY_CARE_PROVIDER_SITE_OTHER): Payer: Medicare Other | Admitting: Endocrinology

## 2023-10-09 ENCOUNTER — Ambulatory Visit: Payer: Medicare HMO | Admitting: Endocrinology

## 2023-10-09 VITALS — BP 124/80 | HR 67 | Resp 16 | Ht 72.0 in | Wt 199.8 lb

## 2023-10-09 DIAGNOSIS — H349 Unspecified retinal vascular occlusion: Secondary | ICD-10-CM

## 2023-10-09 DIAGNOSIS — Z8673 Personal history of transient ischemic attack (TIA), and cerebral infarction without residual deficits: Secondary | ICD-10-CM | POA: Diagnosis not present

## 2023-10-09 DIAGNOSIS — H3411 Central retinal artery occlusion, right eye: Secondary | ICD-10-CM

## 2023-10-09 DIAGNOSIS — E118 Type 2 diabetes mellitus with unspecified complications: Secondary | ICD-10-CM | POA: Diagnosis not present

## 2023-10-09 DIAGNOSIS — Z794 Long term (current) use of insulin: Secondary | ICD-10-CM

## 2023-10-09 DIAGNOSIS — Z7901 Long term (current) use of anticoagulants: Secondary | ICD-10-CM

## 2023-10-09 DIAGNOSIS — I63412 Cerebral infarction due to embolism of left middle cerebral artery: Secondary | ICD-10-CM | POA: Diagnosis not present

## 2023-10-09 LAB — POCT INR: INR: 2.9 (ref 2.0–3.0)

## 2023-10-09 NOTE — Patient Instructions (Signed)
 Patient instructed to take medications as defined in the Anti-coagulation Track section of this encounter.  Patient instructed to take today's dose.  Patient instructed to take two (2) of your 5 mg peach-colored warfarin tablets, by mouth, once-daily--EXCEPT on MONDAYS, take two-and-one-half (2 & 1/2) tablets on Mondays.  Patient verbalized understanding of these instructions.
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
## 2023-10-09 NOTE — Patient Instructions (Signed)
 Hold jardiance until left leg ulcer is healed, okay to resume after wound is healed.   If you glucose in the morning fasting > 120 , restart Lantus 5 units daily at bedtime.

## 2023-10-09 NOTE — Progress Notes (Signed)
 Samples of this drug (Lantus) were given to the patient, quantity 1, Lot Number [id number]

## 2023-10-09 NOTE — Progress Notes (Signed)
 Anticoagulation Management Gregory May is a 63 y.o. male who reports to the clinic for monitoring of warfarin treatment.    Indication:  Retinal artery occlusion, Central; Right MCA CVA, History of; Right retinal embolism; long term current use of oral anticoagulation with warfarin,target INR range 2.0 - 3.0.    Duration: indefinite Supervising physician: Carlynn Purl  Anticoagulation Clinic Visit History: Patient does not report signs/symptoms of bleeding or thromboembolism  Other recent changes: No diet, medications, lifestyle EXCEPT as noted in patient findings.  Anticoagulation Episode Summary     Current INR goal:  2.0-3.0  TTR:  69.2% (1.4 y)  Next INR check:  11/04/2023  INR from last check:  2.9 (10/09/2023)  Weekly max warfarin dose:  --  Target end date:  --  INR check location:  --  Preferred lab:  --  Send INR reminders to:  --   Indications   Pulmonary emboli (HCC) (Resolved) [I26.99] DVT (deep venous thrombosis) (HCC) (Resolved) [I82.409] Retinal artery occlusion central [H34.10] Right retinal embolus [H34.9] Long term (current) use of anticoagulants [Z79.01]        Comments:  --         No Known Allergies  Current Outpatient Medications:    Accu-Chek FastClix Lancets MISC, Use Accu Chek Fastclix lancets to check blood sugar three times daily. DX:E11.65, Disp: 300 each, Rfl: 2   ACCU-CHEK GUIDE test strip, TEST BLOOD SUGAR THREE TIMES DAILY AS DIRECTED, Disp: 300 strip, Rfl: 3   acetaminophen (TYLENOL) 500 MG tablet, Take 1,000 mg by mouth as needed for mild pain or moderate pain., Disp: , Rfl:    APPLE CIDER VINEGAR PO, Take 1 tablet by mouth daily., Disp: , Rfl:    ascorbic acid (VITAMIN C) 1000 MG tablet, Take 1 tablet (1,000 mg total) by mouth daily., Disp: , Rfl:    fluticasone (FLONASE) 50 MCG/ACT nasal spray, USE 2 SPRAY(S) IN EACH NOSTRIL AT BEDTIME AS NEEDED FOR  SINUS  DRAINAGE, Disp: 16 g, Rfl: 0   gabapentin (NEURONTIN) 300 MG capsule,  TAKE 1 CAPSULE FOUR TIMES DAILY AS NEEDED, Disp: 360 capsule, Rfl: 3   HYDROcodone-acetaminophen (NORCO/VICODIN) 5-325 MG tablet, Take 1 tablet by mouth every 6 (six) hours as needed for moderate pain., Disp: 30 tablet, Rfl: 0   insulin aspart (NOVOLOG) 100 UNIT/ML injection, Take 5 units with breakfast and 10 units with supper, okay to increase up to 15 units with supper for hyperglycemia., Disp: 10 mL, Rfl: 4   Insulin Syringes, Disposable, U-100 0.5 ML MISC, Use to inject insulin, Disp: 100 each, Rfl: 2   metFORMIN (GLUCOPHAGE-XR) 750 MG 24 hr tablet, Take 2 tablets (1,500 mg total) by mouth daily., Disp: 180 tablet, Rfl: 3   Multiple Vitamin (MULTIVITAMIN WITH MINERALS) TABS tablet, Take 1 tablet by mouth every morning. Centrum, Disp: , Rfl:    pioglitazone (ACTOS) 30 MG tablet, Take 1 tablet (30 mg total) by mouth daily., Disp: 90 tablet, Rfl: 3   prednisoLONE acetate (PRED FORTE) 1 % ophthalmic suspension, Place 1 drop into the right eye every evening., Disp: , Rfl:    rosuvastatin (CRESTOR) 20 MG tablet, Take 1 tablet (20 mg total) by mouth daily., Disp: 90 tablet, Rfl: 3   warfarin (COUMADIN) 5 MG tablet, Take 2 (two) of your 5 mg peach-colored warfarin tablets on Sundays, Mondays, Wednesdays, Fridays and Saturdays.. All OTHER DAYS (Tuesdays and Thursdays), take two and one-half (2 & 1/2) tablets., Disp: 60 tablet, Rfl: 3   zinc sulfate 220 (  50 Zn) MG capsule, Take 1 capsule (220 mg total) by mouth daily., Disp: , Rfl:    doxycycline (VIBRA-TABS) 100 MG tablet, Take 1 tablet (100 mg total) by mouth 2 (two) times daily. (Patient not taking: Reported on 10/09/2023), Disp: 28 tablet, Rfl: 0   empagliflozin (JARDIANCE) 25 MG TABS tablet, Take 1 tablet (25 mg total) by mouth daily. 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 Date of diagnosis February 08 2014 Duration of anticoagulation: 6 months. End date 08/12/2014.  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 Duration of anticoagulation: 8 months. End date 10/11/2014.  Anticoagulant: Lovenox 120 units daily Switched to Eliquis on 05/25/2014 per patient preference    Pulmonary embolism (HCC)    Sebaceous cyst    on back of neck   Social History   Socioeconomic History   Marital status: Single    Spouse name: Not on file   Number of children: 0   Years of education: Not on file   Highest education level: Not on file  Occupational History   Occupation: disabled  Tobacco Use   Smoking status: Never   Smokeless tobacco: Never  Vaping Use   Vaping status: Never Used  Substance and Sexual Activity   Alcohol use: Not Currently    Comment: beer and mixed drink maybe 5  times a month   Drug use: No   Sexual activity: Never  Other Topics Concern   Not on file  Social History Narrative   Not on file   Social Drivers of Health   Financial Resource Strain: Low Risk  (01/16/2023)   Overall Financial Resource Strain (CARDIA)    Difficulty of Paying Living Expenses: Not hard at all  Food Insecurity: No Food Insecurity (01/16/2023)   Hunger Vital Sign    Worried About Running Out of Food in the Last Year: Never true    Ran Out of Food in the Last Year: Never true  Transportation Needs: No Transportation Needs (01/16/2023)   PRAPARE -  Administrator, Civil Service (Medical): No    Lack of Transportation (Non-Medical): No  Physical Activity: Inactive (01/16/2023)   Exercise Vital Sign    Days of Exercise per Week: 0 days    Minutes of Exercise per Session: 0 min  Stress: No Stress Concern Present (01/16/2023)   [employer] of Occupational Health - Occupational Stress Questionnaire    Feeling of Stress : Only a little  Social Connections: Socially Isolated (01/16/2023)   Social Connection and Isolation Panel [NHANES]    Frequency of Communication with Friends and Family: More than three times a week    Frequency of Social Gatherings with Friends and  Family: Twice a week    Attends Religious Services: Never    Database administrator or Organizations: No    Attends Engineer, structural: Never    Marital Status: Never married   Family History  Problem Relation Age of Onset   Breast cancer Mother    Cancer Mother        small intestine   Liver cancer Mother    Diabetes Mother    Diabetes Father    Diabetes Brother    Hypertension Maternal Grandmother    Heart Problems Maternal Grandmother    Diabetes Paternal Grandmother    Diabetes Paternal Grandfather    Diabetes Brother     ASSESSMENT Recent Results: The most recent result is correlated with 75 mg per week: Lab Results  Component Value Date   INR 2.9 10/09/2023   INR 2.6 09/16/2023   INR 2.0 08/19/2023    Anticoagulation Dosing: Description   Take two (2) of your 5 mg peach-colored warfarin tablets, by mouth, once-daily--EXCEPT on MONDAYS, take two-and-one-half (2 & 1/2) tablets on Mondays.      INR today: Therapeutic  PLAN Weekly dose was decreased by 3% to 72.5 mg per week  Patient Instructions  Patient instructed to take medications as defined in the Anti-coagulation Track section of this encounter.  Patient instructed to take today's dose.  Patient instructed to take two (2) of your 5 mg peach-colored warfarin tablets,  by mouth, once-daily--EXCEPT on MONDAYS, take two-and-one-half (2 & 1/2) tablets on Mondays.  Patient verbalized understanding of these instructions.  Patient advised to contact clinic or seek medical attention if signs/symptoms of bleeding or thromboembolism occur.  Patient verbalized understanding by repeating back information and was advised to contact me if further medication-related questions arise. Patient was also provided an information handout.  Follow-up Return in 26 days (on 11/04/2023) for Follow up INR.  Elicia Lamp, PharmD, CPP  15 minutes spent face-to-face with the patient during the encounter. 50% of time spent on education, including signs/sx bleeding and clotting, as well as food and drug interactions with warfarin. 50% of time was spent on fingerprick POC INR sample collection,processing, results determination, and documentation in [email or web address].

## 2023-10-10 LAB — MICROALBUMIN / CREATININE URINE RATIO
Creatinine, Urine: 28 mg/dL (ref 20–320)
Microalb Creat Ratio: 14 mg/g{creat} (ref <30)
Microalb, Ur: 0.4 mg/dL

## 2023-10-11 DIAGNOSIS — Z794 Long term (current) use of insulin: Secondary | ICD-10-CM | POA: Diagnosis not present

## 2023-10-11 DIAGNOSIS — Z8673 Personal history of transient ischemic attack (TIA), and cerebral infarction without residual deficits: Secondary | ICD-10-CM | POA: Diagnosis not present

## 2023-10-11 DIAGNOSIS — Z9181 History of falling: Secondary | ICD-10-CM | POA: Diagnosis not present

## 2023-10-11 DIAGNOSIS — Z96643 Presence of artificial hip joint, bilateral: Secondary | ICD-10-CM | POA: Diagnosis not present

## 2023-10-11 DIAGNOSIS — E1142 Type 2 diabetes mellitus with diabetic polyneuropathy: Secondary | ICD-10-CM | POA: Diagnosis not present

## 2023-10-11 DIAGNOSIS — K760 Fatty (change of) liver, not elsewhere classified: Secondary | ICD-10-CM | POA: Diagnosis not present

## 2023-10-11 DIAGNOSIS — E7800 Pure hypercholesterolemia, unspecified: Secondary | ICD-10-CM | POA: Diagnosis not present

## 2023-10-11 DIAGNOSIS — E11622 Type 2 diabetes mellitus with other skin ulcer: Secondary | ICD-10-CM | POA: Diagnosis not present

## 2023-10-11 DIAGNOSIS — G4733 Obstructive sleep apnea (adult) (pediatric): Secondary | ICD-10-CM | POA: Diagnosis not present

## 2023-10-11 DIAGNOSIS — Z89512 Acquired absence of left leg below knee: Secondary | ICD-10-CM | POA: Diagnosis not present

## 2023-10-11 DIAGNOSIS — L97821 Non-pressure chronic ulcer of other part of left lower leg limited to breakdown of skin: Secondary | ICD-10-CM | POA: Diagnosis not present

## 2023-10-11 DIAGNOSIS — Z89511 Acquired absence of right leg below knee: Secondary | ICD-10-CM | POA: Diagnosis not present

## 2023-10-11 DIAGNOSIS — Z7984 Long term (current) use of oral hypoglycemic drugs: Secondary | ICD-10-CM | POA: Diagnosis not present

## 2023-10-11 DIAGNOSIS — Z7901 Long term (current) use of anticoagulants: Secondary | ICD-10-CM | POA: Diagnosis not present

## 2023-10-11 DIAGNOSIS — H3411 Central retinal artery occlusion, right eye: Secondary | ICD-10-CM | POA: Diagnosis not present

## 2023-10-11 DIAGNOSIS — Z86718 Personal history of other venous thrombosis and embolism: Secondary | ICD-10-CM | POA: Diagnosis not present

## 2023-10-14 ENCOUNTER — Encounter (HOSPITAL_BASED_OUTPATIENT_CLINIC_OR_DEPARTMENT_OTHER): Payer: Medicare Other | Attending: General Surgery | Admitting: General Surgery

## 2023-10-14 DIAGNOSIS — E11622 Type 2 diabetes mellitus with other skin ulcer: Secondary | ICD-10-CM | POA: Insufficient documentation

## 2023-10-14 DIAGNOSIS — Z89511 Acquired absence of right leg below knee: Secondary | ICD-10-CM | POA: Diagnosis not present

## 2023-10-14 DIAGNOSIS — E11621 Type 2 diabetes mellitus with foot ulcer: Secondary | ICD-10-CM | POA: Diagnosis not present

## 2023-10-14 DIAGNOSIS — L97822 Non-pressure chronic ulcer of other part of left lower leg with fat layer exposed: Secondary | ICD-10-CM | POA: Diagnosis not present

## 2023-10-14 DIAGNOSIS — Z89512 Acquired absence of left leg below knee: Secondary | ICD-10-CM | POA: Insufficient documentation

## 2023-10-18 DIAGNOSIS — L97821 Non-pressure chronic ulcer of other part of left lower leg limited to breakdown of skin: Secondary | ICD-10-CM | POA: Diagnosis not present

## 2023-10-18 DIAGNOSIS — E1142 Type 2 diabetes mellitus with diabetic polyneuropathy: Secondary | ICD-10-CM | POA: Diagnosis not present

## 2023-10-18 DIAGNOSIS — Z794 Long term (current) use of insulin: Secondary | ICD-10-CM | POA: Diagnosis not present

## 2023-10-18 DIAGNOSIS — Z86718 Personal history of other venous thrombosis and embolism: Secondary | ICD-10-CM | POA: Diagnosis not present

## 2023-10-18 DIAGNOSIS — Z89511 Acquired absence of right leg below knee: Secondary | ICD-10-CM | POA: Diagnosis not present

## 2023-10-18 DIAGNOSIS — Z8673 Personal history of transient ischemic attack (TIA), and cerebral infarction without residual deficits: Secondary | ICD-10-CM | POA: Diagnosis not present

## 2023-10-18 DIAGNOSIS — G4733 Obstructive sleep apnea (adult) (pediatric): Secondary | ICD-10-CM | POA: Diagnosis not present

## 2023-10-18 DIAGNOSIS — H3411 Central retinal artery occlusion, right eye: Secondary | ICD-10-CM | POA: Diagnosis not present

## 2023-10-18 DIAGNOSIS — Z7901 Long term (current) use of anticoagulants: Secondary | ICD-10-CM | POA: Diagnosis not present

## 2023-10-18 DIAGNOSIS — E7800 Pure hypercholesterolemia, unspecified: Secondary | ICD-10-CM | POA: Diagnosis not present

## 2023-10-18 DIAGNOSIS — Z96643 Presence of artificial hip joint, bilateral: Secondary | ICD-10-CM | POA: Diagnosis not present

## 2023-10-18 DIAGNOSIS — Z89512 Acquired absence of left leg below knee: Secondary | ICD-10-CM | POA: Diagnosis not present

## 2023-10-18 DIAGNOSIS — Z7984 Long term (current) use of oral hypoglycemic drugs: Secondary | ICD-10-CM | POA: Diagnosis not present

## 2023-10-18 DIAGNOSIS — Z9181 History of falling: Secondary | ICD-10-CM | POA: Diagnosis not present

## 2023-10-18 DIAGNOSIS — E11622 Type 2 diabetes mellitus with other skin ulcer: Secondary | ICD-10-CM | POA: Diagnosis not present

## 2023-10-18 DIAGNOSIS — K760 Fatty (change of) liver, not elsewhere classified: Secondary | ICD-10-CM | POA: Diagnosis not present

## 2023-10-21 DIAGNOSIS — L97821 Non-pressure chronic ulcer of other part of left lower leg limited to breakdown of skin: Secondary | ICD-10-CM | POA: Diagnosis not present

## 2023-10-21 DIAGNOSIS — Z89512 Acquired absence of left leg below knee: Secondary | ICD-10-CM | POA: Diagnosis not present

## 2023-10-21 DIAGNOSIS — E1142 Type 2 diabetes mellitus with diabetic polyneuropathy: Secondary | ICD-10-CM | POA: Diagnosis not present

## 2023-10-21 DIAGNOSIS — K760 Fatty (change of) liver, not elsewhere classified: Secondary | ICD-10-CM | POA: Diagnosis not present

## 2023-10-21 DIAGNOSIS — G4733 Obstructive sleep apnea (adult) (pediatric): Secondary | ICD-10-CM | POA: Diagnosis not present

## 2023-10-21 DIAGNOSIS — Z9181 History of falling: Secondary | ICD-10-CM | POA: Diagnosis not present

## 2023-10-21 DIAGNOSIS — Z7984 Long term (current) use of oral hypoglycemic drugs: Secondary | ICD-10-CM | POA: Diagnosis not present

## 2023-10-21 DIAGNOSIS — Z794 Long term (current) use of insulin: Secondary | ICD-10-CM | POA: Diagnosis not present

## 2023-10-21 DIAGNOSIS — Z96643 Presence of artificial hip joint, bilateral: Secondary | ICD-10-CM | POA: Diagnosis not present

## 2023-10-21 DIAGNOSIS — H3411 Central retinal artery occlusion, right eye: Secondary | ICD-10-CM | POA: Diagnosis not present

## 2023-10-21 DIAGNOSIS — E7800 Pure hypercholesterolemia, unspecified: Secondary | ICD-10-CM | POA: Diagnosis not present

## 2023-10-21 DIAGNOSIS — Z89511 Acquired absence of right leg below knee: Secondary | ICD-10-CM | POA: Diagnosis not present

## 2023-10-21 DIAGNOSIS — Z7901 Long term (current) use of anticoagulants: Secondary | ICD-10-CM | POA: Diagnosis not present

## 2023-10-21 DIAGNOSIS — Z8673 Personal history of transient ischemic attack (TIA), and cerebral infarction without residual deficits: Secondary | ICD-10-CM | POA: Diagnosis not present

## 2023-10-21 DIAGNOSIS — Z86718 Personal history of other venous thrombosis and embolism: Secondary | ICD-10-CM | POA: Diagnosis not present

## 2023-10-21 DIAGNOSIS — E11622 Type 2 diabetes mellitus with other skin ulcer: Secondary | ICD-10-CM | POA: Diagnosis not present

## 2023-10-23 NOTE — Telephone Encounter (Signed)
 Patient not taking medication at the moment, but he will get it filled out and turned in.

## 2023-10-26 DIAGNOSIS — R2681 Unsteadiness on feet: Secondary | ICD-10-CM | POA: Diagnosis not present

## 2023-10-26 DIAGNOSIS — M6281 Muscle weakness (generalized): Secondary | ICD-10-CM | POA: Diagnosis not present

## 2023-10-26 DIAGNOSIS — Z4781 Encounter for orthopedic aftercare following surgical amputation: Secondary | ICD-10-CM | POA: Diagnosis not present

## 2023-10-26 DIAGNOSIS — M160 Bilateral primary osteoarthritis of hip: Secondary | ICD-10-CM | POA: Diagnosis not present

## 2023-10-26 DIAGNOSIS — Z89512 Acquired absence of left leg below knee: Secondary | ICD-10-CM | POA: Diagnosis not present

## 2023-10-28 ENCOUNTER — Encounter (HOSPITAL_BASED_OUTPATIENT_CLINIC_OR_DEPARTMENT_OTHER): Admitting: Internal Medicine

## 2023-10-28 DIAGNOSIS — E11622 Type 2 diabetes mellitus with other skin ulcer: Secondary | ICD-10-CM | POA: Diagnosis not present

## 2023-10-28 DIAGNOSIS — Z89512 Acquired absence of left leg below knee: Secondary | ICD-10-CM | POA: Diagnosis not present

## 2023-10-28 DIAGNOSIS — L97822 Non-pressure chronic ulcer of other part of left lower leg with fat layer exposed: Secondary | ICD-10-CM | POA: Diagnosis not present

## 2023-10-28 DIAGNOSIS — Z89511 Acquired absence of right leg below knee: Secondary | ICD-10-CM | POA: Diagnosis not present

## 2023-10-30 DIAGNOSIS — Z794 Long term (current) use of insulin: Secondary | ICD-10-CM | POA: Diagnosis not present

## 2023-10-30 DIAGNOSIS — Z9181 History of falling: Secondary | ICD-10-CM | POA: Diagnosis not present

## 2023-10-30 DIAGNOSIS — K760 Fatty (change of) liver, not elsewhere classified: Secondary | ICD-10-CM | POA: Diagnosis not present

## 2023-10-30 DIAGNOSIS — L97822 Non-pressure chronic ulcer of other part of left lower leg with fat layer exposed: Secondary | ICD-10-CM | POA: Diagnosis not present

## 2023-10-30 DIAGNOSIS — G4733 Obstructive sleep apnea (adult) (pediatric): Secondary | ICD-10-CM | POA: Diagnosis not present

## 2023-10-30 DIAGNOSIS — Z89512 Acquired absence of left leg below knee: Secondary | ICD-10-CM | POA: Diagnosis not present

## 2023-10-30 DIAGNOSIS — E7800 Pure hypercholesterolemia, unspecified: Secondary | ICD-10-CM | POA: Diagnosis not present

## 2023-10-30 DIAGNOSIS — E11622 Type 2 diabetes mellitus with other skin ulcer: Secondary | ICD-10-CM | POA: Diagnosis not present

## 2023-10-30 DIAGNOSIS — Z7901 Long term (current) use of anticoagulants: Secondary | ICD-10-CM | POA: Diagnosis not present

## 2023-10-30 DIAGNOSIS — Z89511 Acquired absence of right leg below knee: Secondary | ICD-10-CM | POA: Diagnosis not present

## 2023-10-30 DIAGNOSIS — Z86718 Personal history of other venous thrombosis and embolism: Secondary | ICD-10-CM | POA: Diagnosis not present

## 2023-10-30 DIAGNOSIS — Z8673 Personal history of transient ischemic attack (TIA), and cerebral infarction without residual deficits: Secondary | ICD-10-CM | POA: Diagnosis not present

## 2023-10-30 DIAGNOSIS — H3411 Central retinal artery occlusion, right eye: Secondary | ICD-10-CM | POA: Diagnosis not present

## 2023-10-30 DIAGNOSIS — E1142 Type 2 diabetes mellitus with diabetic polyneuropathy: Secondary | ICD-10-CM | POA: Diagnosis not present

## 2023-10-30 DIAGNOSIS — Z96643 Presence of artificial hip joint, bilateral: Secondary | ICD-10-CM | POA: Diagnosis not present

## 2023-10-30 DIAGNOSIS — Z7984 Long term (current) use of oral hypoglycemic drugs: Secondary | ICD-10-CM | POA: Diagnosis not present

## 2023-11-04 ENCOUNTER — Ambulatory Visit (INDEPENDENT_AMBULATORY_CARE_PROVIDER_SITE_OTHER): Admitting: Pharmacist

## 2023-11-04 DIAGNOSIS — I63412 Cerebral infarction due to embolism of left middle cerebral artery: Secondary | ICD-10-CM | POA: Diagnosis not present

## 2023-11-04 DIAGNOSIS — H3411 Central retinal artery occlusion, right eye: Secondary | ICD-10-CM

## 2023-11-04 DIAGNOSIS — Z7901 Long term (current) use of anticoagulants: Secondary | ICD-10-CM | POA: Diagnosis not present

## 2023-11-04 DIAGNOSIS — Z8673 Personal history of transient ischemic attack (TIA), and cerebral infarction without residual deficits: Secondary | ICD-10-CM | POA: Diagnosis not present

## 2023-11-04 DIAGNOSIS — H349 Unspecified retinal vascular occlusion: Secondary | ICD-10-CM

## 2023-11-04 LAB — POCT INR: INR: 3 (ref 2.0–3.0)

## 2023-11-04 NOTE — Progress Notes (Signed)
 INTERNAL MEDICINE TEACHING ATTENDING ADDENDUM   I agree with these recommendations regarding anticoagulation management.   Charissa Bash, MD

## 2023-11-04 NOTE — Patient Instructions (Signed)
 Patient instructed to take medications as defined in the Anti-coagulation Track section of this encounter.  Patient instructed to take today's dose.  Patient instructed to take two (2) of your peach-colored, 5 mg strength warfarin tablets, by mouth once-daily.  Patient verbalized understanding of these instructions.

## 2023-11-04 NOTE — Progress Notes (Signed)
 Anticoagulation Management Gregory May is a 63 y.o. male who reports to the clinic for monitoring of warfarin treatment.    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 Long term current use of oral anticoagulant, warfarin. Target INR range 2.0 - 3.0.    Duration: indefinite Supervising physician:  Charissa Bash, MD  Anticoagulation Clinic Visit History: Patient does not report signs/symptoms of bleeding or thromboembolism  Other recent changes: No diet, medications, lifestyle changes.  Anticoagulation Episode Summary     Current INR goal:  2.0-3.0  TTR:  70.7% (1.5 y)  Next INR check:  12/02/2023  INR from last check:  3.0 (11/04/2023)  Weekly max warfarin dose:  --  Target end date:  --  INR check location:  --  Preferred lab:  --  Send INR reminders to:  --   Indications   Pulmonary emboli (HCC) (Resolved) [I26.99] DVT (deep venous thrombosis) (HCC) (Resolved) [I82.409] Retinal artery occlusion central [H34.10] Right retinal embolus [H34.9] Long term (current) use of anticoagulants [Z79.01]        Comments:  --         No Known Allergies  Current Outpatient Medications:    Accu-Chek FastClix Lancets MISC, Use Accu Chek Fastclix lancets to check blood sugar three times daily. DX:E11.65, Disp: 300 each, Rfl: 2   ACCU-CHEK GUIDE test strip, TEST BLOOD SUGAR THREE TIMES DAILY AS DIRECTED, Disp: 300 strip, Rfl: 3   acetaminophen (TYLENOL) 500 MG tablet, Take 1,000 mg by mouth as needed for mild pain or moderate pain., Disp: , Rfl:    APPLE CIDER VINEGAR PO, Take 1 tablet by mouth daily., Disp: , Rfl:    ascorbic acid (VITAMIN C) 1000 MG tablet, Take 1 tablet (1,000 mg total) by mouth daily., Disp: , Rfl:    doxycycline (VIBRA-TABS) 100 MG tablet, Take 1 tablet (100 mg total) by mouth 2 (two) times daily. (Patient not taking: Reported on 10/09/2023), Disp: 28 tablet, Rfl: 0    empagliflozin (JARDIANCE) 25 MG TABS tablet, Take 1 tablet (25 mg total) by mouth daily. (Patient not taking: Reported on 10/09/2023), Disp: 90 tablet, Rfl: 3   fluticasone (FLONASE) 50 MCG/ACT nasal spray, USE 2 SPRAY(S) IN EACH NOSTRIL AT BEDTIME AS NEEDED FOR  SINUS  DRAINAGE, Disp: 16 g, Rfl: 0   gabapentin (NEURONTIN) 300 MG capsule, TAKE 1 CAPSULE FOUR TIMES DAILY AS NEEDED, Disp: 360 capsule, Rfl: 3   HYDROcodone-acetaminophen (NORCO/VICODIN) 5-325 MG tablet, Take 1 tablet by mouth every 6 (six) hours as needed for moderate pain., Disp: 30 tablet, Rfl: 0   insulin aspart (NOVOLOG) 100 UNIT/ML injection, Take 5 units with breakfast and 10 units with supper, okay to increase up to 15 units with supper for hyperglycemia., Disp: 10 mL, Rfl: 4   Insulin Syringes, Disposable, U-100 0.5 ML MISC, Use to inject insulin, Disp: 100 each, Rfl: 2   metFORMIN (GLUCOPHAGE-XR) 750 MG 24 hr tablet, Take 2 tablets (1,500 mg total) by mouth daily., Disp: 180 tablet, Rfl: 3   Multiple Vitamin (MULTIVITAMIN WITH MINERALS) TABS tablet, Take 1 tablet by mouth every morning. Centrum, Disp: , Rfl:    pioglitazone (ACTOS) 30 MG tablet, Take 1 tablet (30 mg total) by mouth daily., Disp: 90 tablet, Rfl: 3   prednisoLONE acetate (PRED FORTE) 1 % ophthalmic suspension, Place 1 drop into the right eye every evening., Disp: , Rfl:    rosuvastatin (CRESTOR) 20 MG tablet,  Take 1 tablet (20 mg total) by mouth daily., Disp: 90 tablet, Rfl: 3   warfarin (COUMADIN) 5 MG tablet, Take 2 (two) of your 5 mg peach-colored warfarin tablets on Sundays, Mondays, Wednesdays, Fridays and Saturdays.. 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 Date of diagnosis February 08 2014 Duration of anticoagulation: 6 months. End date 08/12/2014.  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 Duration of anticoagulation: 8 months. End date 10/11/2014.  Anticoagulant: Lovenox 120 units daily Switched to Eliquis on 05/25/2014 per patient preference    Pulmonary embolism (HCC)    Sebaceous cyst    on back of neck   Social History   Socioeconomic History   Marital status: Single    Spouse name: Not on file   Number of children: 0   Years of education: Not on file   Highest education level: Not on file  Occupational History   Occupation: disabled  Tobacco Use   Smoking status: Never   Smokeless tobacco: Never  Vaping Use   Vaping status: Never Used  Substance and Sexual Activity   Alcohol use: Not Currently    Comment: beer and mixed drink maybe 5  times a month   Drug use: No   Sexual activity: Never  Other Topics Concern   Not on file  Social History Narrative   Not on file   Social Drivers of Health   Financial Resource Strain: Low Risk  (01/16/2023)   Overall Financial Resource Strain (CARDIA)    Difficulty of Paying Living Expenses: Not hard at all  Food Insecurity: No Food Insecurity (01/16/2023)   Hunger Vital Sign    Worried About Running Out of Food in the Last Year: Never true    Ran Out of Food in the Last Year: Never true  Transportation Needs: No Transportation Needs (01/16/2023)    PRAPARE - Administrator, Civil Service (Medical): No    Lack of Transportation (Non-Medical): No  Physical Activity: Inactive (01/16/2023)   Exercise Vital Sign    Days of Exercise per Week: 0 days    Minutes of Exercise per Session: 0 min  Stress: No Stress Concern Present (01/16/2023)   [employer] of Occupational Health - Occupational Stress Questionnaire    Feeling of Stress : Only a little  Social Connections: Socially Isolated (01/16/2023)   Social Connection and Isolation Panel [NHANES]    Frequency of Communication with Friends and Family: More than three times a week    Frequency of Social  Gatherings with Friends and Family: Twice a week    Attends Religious Services: Never    Database administrator or Organizations: No    Attends Engineer, structural: Never    Marital Status: Never married   Family History  Problem Relation Age of Onset   Breast cancer Mother    Cancer Mother        small intestine   Liver cancer Mother    Diabetes Mother    Diabetes Father    Diabetes Brother    Hypertension Maternal Grandmother    Heart Problems Maternal Grandmother    Diabetes Paternal Grandmother    Diabetes Paternal Grandfather    Diabetes Brother     ASSESSMENT Recent Results: The most recent result is correlated with 72.5 mg per week: Lab Results  Component Value Date   INR 3.0 11/04/2023   INR 2.9 10/09/2023   INR 2.6 09/16/2023    Anticoagulation Dosing: Description   Take two (2) of your 5 mg peach-colored warfarin tablets, by mouth, once-daily.     INR today: Therapeutic  PLAN Weekly dose was decreased by 3.4% to 70 mg per week  Patient Instructions  Patient instructed to take medications as defined in the Anti-coagulation Track section of this encounter.  Patient instructed to take today's dose.  Patient instructed to take two (2) of your peach-colored, 5 mg strength warfarin tablets, by mouth once-daily.  Patient verbalized  understanding of these instructions.  Patient advised to contact clinic or seek medical attention if signs/symptoms of bleeding or thromboembolism occur.  Patient verbalized understanding by repeating back information and was advised to contact me if further medication-related questions arise. Patient was also provided an information handout.  Follow-up Return in 4 weeks (on 12/02/2023).  Elicia Lamp, PharmD, CPP  15 minutes spent face-to-face with the patient during the encounter. 50% of time spent on education, including signs/sx bleeding and clotting, as well as food and drug interactions with warfarin. 50% of time was spent on fingerprick POC INR sample collection,processing, results determination, and documentation in [email or web address].

## 2023-11-06 DIAGNOSIS — Z7901 Long term (current) use of anticoagulants: Secondary | ICD-10-CM | POA: Diagnosis not present

## 2023-11-06 DIAGNOSIS — Z89511 Acquired absence of right leg below knee: Secondary | ICD-10-CM | POA: Diagnosis not present

## 2023-11-06 DIAGNOSIS — Z7984 Long term (current) use of oral hypoglycemic drugs: Secondary | ICD-10-CM | POA: Diagnosis not present

## 2023-11-06 DIAGNOSIS — E1142 Type 2 diabetes mellitus with diabetic polyneuropathy: Secondary | ICD-10-CM | POA: Diagnosis not present

## 2023-11-06 DIAGNOSIS — Z96643 Presence of artificial hip joint, bilateral: Secondary | ICD-10-CM | POA: Diagnosis not present

## 2023-11-06 DIAGNOSIS — Z9181 History of falling: Secondary | ICD-10-CM | POA: Diagnosis not present

## 2023-11-06 DIAGNOSIS — Z86718 Personal history of other venous thrombosis and embolism: Secondary | ICD-10-CM | POA: Diagnosis not present

## 2023-11-06 DIAGNOSIS — Z794 Long term (current) use of insulin: Secondary | ICD-10-CM | POA: Diagnosis not present

## 2023-11-06 DIAGNOSIS — G4733 Obstructive sleep apnea (adult) (pediatric): Secondary | ICD-10-CM | POA: Diagnosis not present

## 2023-11-06 DIAGNOSIS — K760 Fatty (change of) liver, not elsewhere classified: Secondary | ICD-10-CM | POA: Diagnosis not present

## 2023-11-06 DIAGNOSIS — H3411 Central retinal artery occlusion, right eye: Secondary | ICD-10-CM | POA: Diagnosis not present

## 2023-11-06 DIAGNOSIS — E11622 Type 2 diabetes mellitus with other skin ulcer: Secondary | ICD-10-CM | POA: Diagnosis not present

## 2023-11-06 DIAGNOSIS — L97822 Non-pressure chronic ulcer of other part of left lower leg with fat layer exposed: Secondary | ICD-10-CM | POA: Diagnosis not present

## 2023-11-06 DIAGNOSIS — E7800 Pure hypercholesterolemia, unspecified: Secondary | ICD-10-CM | POA: Diagnosis not present

## 2023-11-06 DIAGNOSIS — Z89512 Acquired absence of left leg below knee: Secondary | ICD-10-CM | POA: Diagnosis not present

## 2023-11-06 DIAGNOSIS — Z8673 Personal history of transient ischemic attack (TIA), and cerebral infarction without residual deficits: Secondary | ICD-10-CM | POA: Diagnosis not present

## 2023-11-11 ENCOUNTER — Encounter (HOSPITAL_BASED_OUTPATIENT_CLINIC_OR_DEPARTMENT_OTHER): Admitting: General Surgery

## 2023-11-11 DIAGNOSIS — T8789 Other complications of amputation stump: Secondary | ICD-10-CM | POA: Diagnosis not present

## 2023-11-11 DIAGNOSIS — Z89511 Acquired absence of right leg below knee: Secondary | ICD-10-CM | POA: Diagnosis not present

## 2023-11-11 DIAGNOSIS — L97822 Non-pressure chronic ulcer of other part of left lower leg with fat layer exposed: Secondary | ICD-10-CM | POA: Diagnosis not present

## 2023-11-11 DIAGNOSIS — E11622 Type 2 diabetes mellitus with other skin ulcer: Secondary | ICD-10-CM | POA: Diagnosis not present

## 2023-11-11 DIAGNOSIS — Z89512 Acquired absence of left leg below knee: Secondary | ICD-10-CM | POA: Diagnosis not present

## 2023-11-13 DIAGNOSIS — Z8673 Personal history of transient ischemic attack (TIA), and cerebral infarction without residual deficits: Secondary | ICD-10-CM | POA: Diagnosis not present

## 2023-11-13 DIAGNOSIS — Z7984 Long term (current) use of oral hypoglycemic drugs: Secondary | ICD-10-CM | POA: Diagnosis not present

## 2023-11-13 DIAGNOSIS — Z7901 Long term (current) use of anticoagulants: Secondary | ICD-10-CM | POA: Diagnosis not present

## 2023-11-13 DIAGNOSIS — H3411 Central retinal artery occlusion, right eye: Secondary | ICD-10-CM | POA: Diagnosis not present

## 2023-11-13 DIAGNOSIS — Z794 Long term (current) use of insulin: Secondary | ICD-10-CM | POA: Diagnosis not present

## 2023-11-13 DIAGNOSIS — K760 Fatty (change of) liver, not elsewhere classified: Secondary | ICD-10-CM | POA: Diagnosis not present

## 2023-11-13 DIAGNOSIS — Z89512 Acquired absence of left leg below knee: Secondary | ICD-10-CM | POA: Diagnosis not present

## 2023-11-13 DIAGNOSIS — G4733 Obstructive sleep apnea (adult) (pediatric): Secondary | ICD-10-CM | POA: Diagnosis not present

## 2023-11-13 DIAGNOSIS — E11622 Type 2 diabetes mellitus with other skin ulcer: Secondary | ICD-10-CM | POA: Diagnosis not present

## 2023-11-13 DIAGNOSIS — Z89511 Acquired absence of right leg below knee: Secondary | ICD-10-CM | POA: Diagnosis not present

## 2023-11-13 DIAGNOSIS — Z9181 History of falling: Secondary | ICD-10-CM | POA: Diagnosis not present

## 2023-11-13 DIAGNOSIS — Z96643 Presence of artificial hip joint, bilateral: Secondary | ICD-10-CM | POA: Diagnosis not present

## 2023-11-13 DIAGNOSIS — E1142 Type 2 diabetes mellitus with diabetic polyneuropathy: Secondary | ICD-10-CM | POA: Diagnosis not present

## 2023-11-13 DIAGNOSIS — Z86718 Personal history of other venous thrombosis and embolism: Secondary | ICD-10-CM | POA: Diagnosis not present

## 2023-11-13 DIAGNOSIS — L97822 Non-pressure chronic ulcer of other part of left lower leg with fat layer exposed: Secondary | ICD-10-CM | POA: Diagnosis not present

## 2023-11-13 DIAGNOSIS — E7800 Pure hypercholesterolemia, unspecified: Secondary | ICD-10-CM | POA: Diagnosis not present

## 2023-11-20 DIAGNOSIS — E7800 Pure hypercholesterolemia, unspecified: Secondary | ICD-10-CM | POA: Diagnosis not present

## 2023-11-20 DIAGNOSIS — H3411 Central retinal artery occlusion, right eye: Secondary | ICD-10-CM | POA: Diagnosis not present

## 2023-11-20 DIAGNOSIS — Z86718 Personal history of other venous thrombosis and embolism: Secondary | ICD-10-CM | POA: Diagnosis not present

## 2023-11-20 DIAGNOSIS — E11622 Type 2 diabetes mellitus with other skin ulcer: Secondary | ICD-10-CM | POA: Diagnosis not present

## 2023-11-20 DIAGNOSIS — Z794 Long term (current) use of insulin: Secondary | ICD-10-CM | POA: Diagnosis not present

## 2023-11-20 DIAGNOSIS — Z89511 Acquired absence of right leg below knee: Secondary | ICD-10-CM | POA: Diagnosis not present

## 2023-11-20 DIAGNOSIS — E1142 Type 2 diabetes mellitus with diabetic polyneuropathy: Secondary | ICD-10-CM | POA: Diagnosis not present

## 2023-11-20 DIAGNOSIS — L97822 Non-pressure chronic ulcer of other part of left lower leg with fat layer exposed: Secondary | ICD-10-CM | POA: Diagnosis not present

## 2023-11-20 DIAGNOSIS — Z9181 History of falling: Secondary | ICD-10-CM | POA: Diagnosis not present

## 2023-11-20 DIAGNOSIS — Z96643 Presence of artificial hip joint, bilateral: Secondary | ICD-10-CM | POA: Diagnosis not present

## 2023-11-20 DIAGNOSIS — Z89512 Acquired absence of left leg below knee: Secondary | ICD-10-CM | POA: Diagnosis not present

## 2023-11-20 DIAGNOSIS — Z7901 Long term (current) use of anticoagulants: Secondary | ICD-10-CM | POA: Diagnosis not present

## 2023-11-20 DIAGNOSIS — K760 Fatty (change of) liver, not elsewhere classified: Secondary | ICD-10-CM | POA: Diagnosis not present

## 2023-11-20 DIAGNOSIS — Z8673 Personal history of transient ischemic attack (TIA), and cerebral infarction without residual deficits: Secondary | ICD-10-CM | POA: Diagnosis not present

## 2023-11-20 DIAGNOSIS — G4733 Obstructive sleep apnea (adult) (pediatric): Secondary | ICD-10-CM | POA: Diagnosis not present

## 2023-11-20 DIAGNOSIS — Z7984 Long term (current) use of oral hypoglycemic drugs: Secondary | ICD-10-CM | POA: Diagnosis not present

## 2023-11-26 ENCOUNTER — Encounter (HOSPITAL_BASED_OUTPATIENT_CLINIC_OR_DEPARTMENT_OTHER): Attending: General Surgery | Admitting: General Surgery

## 2023-11-26 DIAGNOSIS — L97822 Non-pressure chronic ulcer of other part of left lower leg with fat layer exposed: Secondary | ICD-10-CM | POA: Insufficient documentation

## 2023-11-26 DIAGNOSIS — Z89511 Acquired absence of right leg below knee: Secondary | ICD-10-CM | POA: Insufficient documentation

## 2023-11-26 DIAGNOSIS — E11622 Type 2 diabetes mellitus with other skin ulcer: Secondary | ICD-10-CM | POA: Insufficient documentation

## 2023-11-26 DIAGNOSIS — Z4781 Encounter for orthopedic aftercare following surgical amputation: Secondary | ICD-10-CM | POA: Diagnosis not present

## 2023-11-26 DIAGNOSIS — M160 Bilateral primary osteoarthritis of hip: Secondary | ICD-10-CM | POA: Diagnosis not present

## 2023-11-26 DIAGNOSIS — R2681 Unsteadiness on feet: Secondary | ICD-10-CM | POA: Diagnosis not present

## 2023-11-26 DIAGNOSIS — T8789 Other complications of amputation stump: Secondary | ICD-10-CM | POA: Diagnosis not present

## 2023-11-26 DIAGNOSIS — M6281 Muscle weakness (generalized): Secondary | ICD-10-CM | POA: Diagnosis not present

## 2023-11-26 DIAGNOSIS — Z89512 Acquired absence of left leg below knee: Secondary | ICD-10-CM | POA: Insufficient documentation

## 2023-11-27 DIAGNOSIS — H3411 Central retinal artery occlusion, right eye: Secondary | ICD-10-CM | POA: Diagnosis not present

## 2023-11-27 DIAGNOSIS — Z794 Long term (current) use of insulin: Secondary | ICD-10-CM | POA: Diagnosis not present

## 2023-11-27 DIAGNOSIS — Z89512 Acquired absence of left leg below knee: Secondary | ICD-10-CM | POA: Diagnosis not present

## 2023-11-27 DIAGNOSIS — Z86718 Personal history of other venous thrombosis and embolism: Secondary | ICD-10-CM | POA: Diagnosis not present

## 2023-11-27 DIAGNOSIS — E7800 Pure hypercholesterolemia, unspecified: Secondary | ICD-10-CM | POA: Diagnosis not present

## 2023-11-27 DIAGNOSIS — Z7984 Long term (current) use of oral hypoglycemic drugs: Secondary | ICD-10-CM | POA: Diagnosis not present

## 2023-11-27 DIAGNOSIS — K760 Fatty (change of) liver, not elsewhere classified: Secondary | ICD-10-CM | POA: Diagnosis not present

## 2023-11-27 DIAGNOSIS — L97822 Non-pressure chronic ulcer of other part of left lower leg with fat layer exposed: Secondary | ICD-10-CM | POA: Diagnosis not present

## 2023-11-27 DIAGNOSIS — Z89511 Acquired absence of right leg below knee: Secondary | ICD-10-CM | POA: Diagnosis not present

## 2023-11-27 DIAGNOSIS — E11622 Type 2 diabetes mellitus with other skin ulcer: Secondary | ICD-10-CM | POA: Diagnosis not present

## 2023-11-27 DIAGNOSIS — Z96643 Presence of artificial hip joint, bilateral: Secondary | ICD-10-CM | POA: Diagnosis not present

## 2023-11-27 DIAGNOSIS — Z7901 Long term (current) use of anticoagulants: Secondary | ICD-10-CM | POA: Diagnosis not present

## 2023-11-27 DIAGNOSIS — Z9181 History of falling: Secondary | ICD-10-CM | POA: Diagnosis not present

## 2023-11-27 DIAGNOSIS — G4733 Obstructive sleep apnea (adult) (pediatric): Secondary | ICD-10-CM | POA: Diagnosis not present

## 2023-11-27 DIAGNOSIS — Z8673 Personal history of transient ischemic attack (TIA), and cerebral infarction without residual deficits: Secondary | ICD-10-CM | POA: Diagnosis not present

## 2023-11-27 DIAGNOSIS — E1142 Type 2 diabetes mellitus with diabetic polyneuropathy: Secondary | ICD-10-CM | POA: Diagnosis not present

## 2023-12-02 ENCOUNTER — Ambulatory Visit (INDEPENDENT_AMBULATORY_CARE_PROVIDER_SITE_OTHER): Admitting: Pharmacist

## 2023-12-02 DIAGNOSIS — H3411 Central retinal artery occlusion, right eye: Secondary | ICD-10-CM

## 2023-12-02 DIAGNOSIS — Z7901 Long term (current) use of anticoagulants: Secondary | ICD-10-CM | POA: Diagnosis not present

## 2023-12-02 DIAGNOSIS — Z8673 Personal history of transient ischemic attack (TIA), and cerebral infarction without residual deficits: Secondary | ICD-10-CM

## 2023-12-02 DIAGNOSIS — I63412 Cerebral infarction due to embolism of left middle cerebral artery: Secondary | ICD-10-CM

## 2023-12-02 DIAGNOSIS — H349 Unspecified retinal vascular occlusion: Secondary | ICD-10-CM | POA: Diagnosis not present

## 2023-12-02 LAB — POCT INR: INR: 3 (ref 2.0–3.0)

## 2023-12-02 NOTE — Patient Instructions (Signed)
 Patient instructed to take medications as defined in the Anti-coagulation Track section of this encounter.  Patient instructed to take today's dose.  Patient instructed to take  two (2) of your 5 mg peach-colored warfarin tablets, by mouth, once-daily on Sundays, Mondays, Wednesdays, Thursdays and Saturdays. On TUESDAYS and FRIDAYS, take ONLY one-and-one-half (1 & 1/2) of your 5 mg peach-colored tablets. If antibiotics are added by any physician, please let us  know so warfarin dose can be adjusted if necessary.  Patient verbalized understanding of these instructions.

## 2023-12-02 NOTE — Progress Notes (Signed)
 Anticoagulation Management Gregory May is a 63 y.o. male who reports to the clinic for monitoring of warfarin treatment.    Indication:  Central retinal artery occlusion, History of; History of R middle cerebral artery CVA, Long term current use of oral anticoagulation with warfarin to maintain INR range 2.0 -3.0.   Duration: indefinite Supervising physician:  Burnell Carry. Broadus Canes, MD  Anticoagulation Clinic Visit History: Patient does not report signs/symptoms of bleeding or thromboembolism  Other recent changes: No diet, medications, lifestyle changes except as noted in patient findings,which see.  Anticoagulation Episode Summary     Current INR goal:  2.0-3.0  TTR:  72.1% (1.6 y)  Next INR check:  12/23/2023  INR from last check:  3.0 (12/02/2023)  Weekly max warfarin dose:  --  Target end date:  --  INR check location:  --  Preferred lab:  --  Send INR reminders to:  --   Indications   Pulmonary emboli (HCC) (Resolved) [I26.99] DVT (deep venous thrombosis) (HCC) (Resolved) [I82.409] Retinal artery occlusion central [H34.10] Right retinal embolus [H34.9] Long term (current) use of anticoagulants [Z79.01]        Comments:  --         No Known Allergies  Current Outpatient Medications:    Accu-Chek FastClix Lancets MISC, Use Accu Chek Fastclix lancets to check blood sugar three times daily. DX:E11.65, Disp: 300 each, Rfl: 2   ACCU-CHEK GUIDE test strip, TEST BLOOD SUGAR THREE TIMES DAILY AS DIRECTED, Disp: 300 strip, Rfl: 3   acetaminophen  (TYLENOL ) 500 MG tablet, Take 1,000 mg by mouth as needed for mild pain or moderate pain., Disp: , Rfl:    APPLE CIDER VINEGAR PO, Take 1 tablet by mouth daily., Disp: , Rfl:    ascorbic acid  (VITAMIN C ) 1000 MG tablet, Take 1 tablet (1,000 mg total) by mouth daily., Disp: , Rfl:    fluticasone  (FLONASE ) 50 MCG/ACT nasal spray, USE 2 SPRAY(S) IN EACH NOSTRIL AT BEDTIME AS NEEDED FOR  SINUS  DRAINAGE, Disp: 16 g, Rfl: 0    gabapentin  (NEURONTIN ) 300 MG capsule, TAKE 1 CAPSULE FOUR TIMES DAILY AS NEEDED, Disp: 360 capsule, Rfl: 3   HYDROcodone -acetaminophen  (NORCO/VICODIN) 5-325 MG tablet, Take 1 tablet by mouth every 6 (six) hours as needed for moderate pain., Disp: 30 tablet, Rfl: 0   insulin  aspart (NOVOLOG ) 100 UNIT/ML injection, Take 5 units with breakfast and 10 units with supper, okay to increase up to 15 units with supper for hyperglycemia., Disp: 10 mL, Rfl: 4   Insulin  Syringes, Disposable, U-100 0.5 ML MISC, Use to inject insulin , Disp: 100 each, Rfl: 2   metFORMIN  (GLUCOPHAGE -XR) 750 MG 24 hr tablet, Take 2 tablets (1,500 mg total) by mouth daily., Disp: 180 tablet, Rfl: 3   Multiple Vitamin (MULTIVITAMIN WITH MINERALS) TABS tablet, Take 1 tablet by mouth every morning. Centrum, Disp: , Rfl:    pioglitazone  (ACTOS ) 30 MG tablet, Take 1 tablet (30 mg total) by mouth daily., Disp: 90 tablet, Rfl: 3   prednisoLONE  acetate (PRED FORTE ) 1 % ophthalmic suspension, Place 1 drop into the right eye every evening., Disp: , Rfl:    rosuvastatin  (CRESTOR ) 20 MG tablet, Take 1 tablet (20 mg total) by mouth daily., Disp: 90 tablet, Rfl: 3   warfarin (COUMADIN ) 5 MG tablet, Take 2 (two) of your 5 mg peach-colored warfarin tablets on Sundays, Mondays, Wednesdays, Fridays and Saturdays.. All OTHER DAYS (Tuesdays and Thursdays), take two and one-half (2 & 1/2) tablets., Disp: 60 tablet, Rfl:  3   zinc  sulfate 220 (50 Zn) MG capsule, Take 1 capsule (220 mg total) by mouth daily., Disp: , Rfl:    doxycycline  (VIBRA -TABS) 100 MG tablet, Take 1 tablet (100 mg total) by mouth 2 (two) times daily. (Patient not taking: Reported on 12/02/2023), Disp: 28 tablet, Rfl: 0   empagliflozin  (JARDIANCE ) 25 MG TABS tablet, Take 1 tablet (25 mg total) by mouth daily. 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 Date of diagnosis February 08 2014 Duration of anticoagulation: 6 months. End date 08/12/2014.  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 Duration of anticoagulation: 8 months. End date 10/11/2014.  Anticoagulant: Lovenox  120 units daily Switched to Eliquis  on 05/25/2014 per patient preference    Pulmonary embolism (HCC)    Sebaceous cyst    on back of neck   Social History   Socioeconomic History   Marital status: Single    Spouse name: Not on file   Number of children: 0   Years of education: Not on file   Highest education level: Not on file  Occupational History   Occupation: disabled  Tobacco Use   Smoking status: Never   Smokeless tobacco: Never  Vaping Use   Vaping status: Never Used  Substance and Sexual Activity   Alcohol use: Not Currently    Comment: beer and mixed drink maybe 5  times a month   Drug use: No   Sexual activity: Never  Other Topics Concern   Not on file  Social History Narrative   Not on file   Social Drivers of Health   Financial Resource Strain: Low Risk  (01/16/2023)   Overall Financial Resource Strain (CARDIA)    Difficulty of Paying Living Expenses: Not hard at all  Food Insecurity: No Food Insecurity (01/16/2023)   Hunger Vital Sign    Worried About Running Out of Food in the Last Year: Never true    Ran Out of Food in the Last Year: Never true  Transportation Needs: No  Transportation Needs (01/16/2023)   PRAPARE - Administrator, Civil Service (Medical): No    Lack of Transportation (Non-Medical): No  Physical Activity: Inactive (01/16/2023)   Exercise Vital Sign    Days of Exercise per Week: 0 days    Minutes of Exercise per Session: 0 min  Stress: No Stress Concern Present (01/16/2023)   [employer] of Occupational Health - Occupational Stress Questionnaire    Feeling of Stress : Only a little  Social Connections: Socially Isolated (01/16/2023)   Social Connection and Isolation Panel [NHANES]    Frequency of Communication with Friends and Family: More than three times a week    Frequency  of Social Gatherings with Friends and Family: Twice a week    Attends Religious Services: Never    Database administrator or Organizations: No    Attends Engineer, structural: Never    Marital Status: Never married   Family History  Problem Relation Age of Onset   Breast cancer Mother    Cancer Mother        small intestine   Liver cancer Mother    Diabetes Mother    Diabetes Father    Diabetes Brother    Hypertension Maternal Grandmother    Heart Problems Maternal Grandmother    Diabetes Paternal Grandmother    Diabetes Paternal Grandfather    Diabetes Brother     ASSESSMENT Recent Results: The most recent result is correlated with 70 mg per week: Lab Results  Component Value Date   INR 3.0 12/02/2023   INR 3.0 11/04/2023   INR 2.9 10/09/2023    Anticoagulation Dosing: Description   Take two (2) of your 5 mg peach-colored warfarin tablets, by mouth, once-daily on Sundays, Mondays, Wednesdays, Thursdays and Saturdays. On TUESDAYS and FRIDAYS, take ONLY one-and-one-half (1 & 1/2) of your 5 mg peach-colored tablets. If antibiotics are added by any physician, please let us  know so warfarin dose can be adjusted if necessary.      INR today: Therapeutic  PLAN Weekly dose was decreased by 7% to 65 mg per week  Patient  Instructions  Patient instructed to take medications as defined in the Anti-coagulation Track section of this encounter.  Patient instructed to take today's dose.  Patient instructed to take  two (2) of your 5 mg peach-colored warfarin tablets, by mouth, once-daily on Sundays, Mondays, Wednesdays, Thursdays and Saturdays. On TUESDAYS and FRIDAYS, take ONLY one-and-one-half (1 & 1/2) of your 5 mg peach-colored tablets. If antibiotics are added by any physician, please let us  know so warfarin dose can be adjusted if necessary.  Patient verbalized understanding of these instructions.  Patient advised to contact clinic or seek medical attention if signs/symptoms of bleeding or thromboembolism occur.  Patient verbalized understanding by repeating back information and was advised to contact me if further medication-related questions arise. Patient was also provided an information handout.  Follow-up Return in 3 weeks (on 12/23/2023) for Follow up INR.  Kenda Paula, PharmD, CPP  15 minutes spent face-to-face with the patient during the encounter. 50% of time spent on education, including signs/sx bleeding and clotting, as well as food and drug interactions with warfarin. 50% of time was spent on fingerprick POC INR sample collection,processing, results determination, and documentation in [email or web address].

## 2023-12-04 DIAGNOSIS — E11622 Type 2 diabetes mellitus with other skin ulcer: Secondary | ICD-10-CM | POA: Diagnosis not present

## 2023-12-04 DIAGNOSIS — H3411 Central retinal artery occlusion, right eye: Secondary | ICD-10-CM | POA: Diagnosis not present

## 2023-12-04 DIAGNOSIS — E7800 Pure hypercholesterolemia, unspecified: Secondary | ICD-10-CM | POA: Diagnosis not present

## 2023-12-04 DIAGNOSIS — L97822 Non-pressure chronic ulcer of other part of left lower leg with fat layer exposed: Secondary | ICD-10-CM | POA: Diagnosis not present

## 2023-12-04 DIAGNOSIS — Z96643 Presence of artificial hip joint, bilateral: Secondary | ICD-10-CM | POA: Diagnosis not present

## 2023-12-04 DIAGNOSIS — K760 Fatty (change of) liver, not elsewhere classified: Secondary | ICD-10-CM | POA: Diagnosis not present

## 2023-12-04 DIAGNOSIS — Z794 Long term (current) use of insulin: Secondary | ICD-10-CM | POA: Diagnosis not present

## 2023-12-04 DIAGNOSIS — Z7901 Long term (current) use of anticoagulants: Secondary | ICD-10-CM | POA: Diagnosis not present

## 2023-12-04 DIAGNOSIS — Z89512 Acquired absence of left leg below knee: Secondary | ICD-10-CM | POA: Diagnosis not present

## 2023-12-04 DIAGNOSIS — Z89511 Acquired absence of right leg below knee: Secondary | ICD-10-CM | POA: Diagnosis not present

## 2023-12-04 DIAGNOSIS — G4733 Obstructive sleep apnea (adult) (pediatric): Secondary | ICD-10-CM | POA: Diagnosis not present

## 2023-12-04 DIAGNOSIS — Z8673 Personal history of transient ischemic attack (TIA), and cerebral infarction without residual deficits: Secondary | ICD-10-CM | POA: Diagnosis not present

## 2023-12-04 DIAGNOSIS — E1142 Type 2 diabetes mellitus with diabetic polyneuropathy: Secondary | ICD-10-CM | POA: Diagnosis not present

## 2023-12-04 DIAGNOSIS — Z86718 Personal history of other venous thrombosis and embolism: Secondary | ICD-10-CM | POA: Diagnosis not present

## 2023-12-04 DIAGNOSIS — Z7984 Long term (current) use of oral hypoglycemic drugs: Secondary | ICD-10-CM | POA: Diagnosis not present

## 2023-12-04 DIAGNOSIS — Z9181 History of falling: Secondary | ICD-10-CM | POA: Diagnosis not present

## 2023-12-05 ENCOUNTER — Telehealth: Payer: Self-pay

## 2023-12-05 NOTE — Telephone Encounter (Signed)
 Received patient's patient assistance meds. Received Tresiba . Patient called and made aware.

## 2023-12-10 ENCOUNTER — Encounter (HOSPITAL_BASED_OUTPATIENT_CLINIC_OR_DEPARTMENT_OTHER): Admitting: General Surgery

## 2023-12-10 DIAGNOSIS — Z89511 Acquired absence of right leg below knee: Secondary | ICD-10-CM | POA: Diagnosis not present

## 2023-12-10 DIAGNOSIS — L97822 Non-pressure chronic ulcer of other part of left lower leg with fat layer exposed: Secondary | ICD-10-CM | POA: Diagnosis not present

## 2023-12-10 DIAGNOSIS — T8789 Other complications of amputation stump: Secondary | ICD-10-CM | POA: Diagnosis not present

## 2023-12-10 DIAGNOSIS — Z89512 Acquired absence of left leg below knee: Secondary | ICD-10-CM | POA: Diagnosis not present

## 2023-12-10 DIAGNOSIS — E11622 Type 2 diabetes mellitus with other skin ulcer: Secondary | ICD-10-CM | POA: Diagnosis not present

## 2023-12-11 DIAGNOSIS — E1142 Type 2 diabetes mellitus with diabetic polyneuropathy: Secondary | ICD-10-CM | POA: Diagnosis not present

## 2023-12-11 DIAGNOSIS — Z7984 Long term (current) use of oral hypoglycemic drugs: Secondary | ICD-10-CM | POA: Diagnosis not present

## 2023-12-11 DIAGNOSIS — Z96643 Presence of artificial hip joint, bilateral: Secondary | ICD-10-CM | POA: Diagnosis not present

## 2023-12-11 DIAGNOSIS — Z794 Long term (current) use of insulin: Secondary | ICD-10-CM | POA: Diagnosis not present

## 2023-12-11 DIAGNOSIS — E11622 Type 2 diabetes mellitus with other skin ulcer: Secondary | ICD-10-CM | POA: Diagnosis not present

## 2023-12-11 DIAGNOSIS — L97822 Non-pressure chronic ulcer of other part of left lower leg with fat layer exposed: Secondary | ICD-10-CM | POA: Diagnosis not present

## 2023-12-11 DIAGNOSIS — K760 Fatty (change of) liver, not elsewhere classified: Secondary | ICD-10-CM | POA: Diagnosis not present

## 2023-12-11 DIAGNOSIS — Z89512 Acquired absence of left leg below knee: Secondary | ICD-10-CM | POA: Diagnosis not present

## 2023-12-11 DIAGNOSIS — Z9181 History of falling: Secondary | ICD-10-CM | POA: Diagnosis not present

## 2023-12-11 DIAGNOSIS — Z89511 Acquired absence of right leg below knee: Secondary | ICD-10-CM | POA: Diagnosis not present

## 2023-12-11 DIAGNOSIS — Z7901 Long term (current) use of anticoagulants: Secondary | ICD-10-CM | POA: Diagnosis not present

## 2023-12-11 DIAGNOSIS — Z86718 Personal history of other venous thrombosis and embolism: Secondary | ICD-10-CM | POA: Diagnosis not present

## 2023-12-11 DIAGNOSIS — Z8673 Personal history of transient ischemic attack (TIA), and cerebral infarction without residual deficits: Secondary | ICD-10-CM | POA: Diagnosis not present

## 2023-12-11 DIAGNOSIS — G4733 Obstructive sleep apnea (adult) (pediatric): Secondary | ICD-10-CM | POA: Diagnosis not present

## 2023-12-11 DIAGNOSIS — H3411 Central retinal artery occlusion, right eye: Secondary | ICD-10-CM | POA: Diagnosis not present

## 2023-12-11 DIAGNOSIS — E7800 Pure hypercholesterolemia, unspecified: Secondary | ICD-10-CM | POA: Diagnosis not present

## 2023-12-18 DIAGNOSIS — Z96643 Presence of artificial hip joint, bilateral: Secondary | ICD-10-CM | POA: Diagnosis not present

## 2023-12-18 DIAGNOSIS — Z7984 Long term (current) use of oral hypoglycemic drugs: Secondary | ICD-10-CM | POA: Diagnosis not present

## 2023-12-18 DIAGNOSIS — K760 Fatty (change of) liver, not elsewhere classified: Secondary | ICD-10-CM | POA: Diagnosis not present

## 2023-12-18 DIAGNOSIS — Z9181 History of falling: Secondary | ICD-10-CM | POA: Diagnosis not present

## 2023-12-18 DIAGNOSIS — Z8673 Personal history of transient ischemic attack (TIA), and cerebral infarction without residual deficits: Secondary | ICD-10-CM | POA: Diagnosis not present

## 2023-12-18 DIAGNOSIS — L97822 Non-pressure chronic ulcer of other part of left lower leg with fat layer exposed: Secondary | ICD-10-CM | POA: Diagnosis not present

## 2023-12-18 DIAGNOSIS — H3411 Central retinal artery occlusion, right eye: Secondary | ICD-10-CM | POA: Diagnosis not present

## 2023-12-18 DIAGNOSIS — Z89511 Acquired absence of right leg below knee: Secondary | ICD-10-CM | POA: Diagnosis not present

## 2023-12-18 DIAGNOSIS — Z7901 Long term (current) use of anticoagulants: Secondary | ICD-10-CM | POA: Diagnosis not present

## 2023-12-18 DIAGNOSIS — E1142 Type 2 diabetes mellitus with diabetic polyneuropathy: Secondary | ICD-10-CM | POA: Diagnosis not present

## 2023-12-18 DIAGNOSIS — Z794 Long term (current) use of insulin: Secondary | ICD-10-CM | POA: Diagnosis not present

## 2023-12-18 DIAGNOSIS — Z89512 Acquired absence of left leg below knee: Secondary | ICD-10-CM | POA: Diagnosis not present

## 2023-12-18 DIAGNOSIS — E11622 Type 2 diabetes mellitus with other skin ulcer: Secondary | ICD-10-CM | POA: Diagnosis not present

## 2023-12-18 DIAGNOSIS — E7800 Pure hypercholesterolemia, unspecified: Secondary | ICD-10-CM | POA: Diagnosis not present

## 2023-12-18 DIAGNOSIS — G4733 Obstructive sleep apnea (adult) (pediatric): Secondary | ICD-10-CM | POA: Diagnosis not present

## 2023-12-18 DIAGNOSIS — Z86718 Personal history of other venous thrombosis and embolism: Secondary | ICD-10-CM | POA: Diagnosis not present

## 2023-12-23 ENCOUNTER — Ambulatory Visit (INDEPENDENT_AMBULATORY_CARE_PROVIDER_SITE_OTHER): Admitting: Pharmacist

## 2023-12-23 DIAGNOSIS — H349 Unspecified retinal vascular occlusion: Secondary | ICD-10-CM

## 2023-12-23 DIAGNOSIS — H3411 Central retinal artery occlusion, right eye: Secondary | ICD-10-CM

## 2023-12-23 DIAGNOSIS — Z7901 Long term (current) use of anticoagulants: Secondary | ICD-10-CM

## 2023-12-23 LAB — POCT INR: INR: 2.1 (ref 2.0–3.0)

## 2023-12-23 NOTE — Patient Instructions (Signed)
 Patient instructed to take medications as defined in the Anti-coagulation Track section of this encounter.  Patient instructed to take today's dose.  Patient instructed to take two (2) of your 5 mg peach-colored warfarin tablets, by mouth, once-daily, every day. If antibiotics are added by any physician, please let us  know so warfarin dose can be adjusted if necessary.  Patient verbalized understanding of these instructions.

## 2023-12-23 NOTE — Progress Notes (Signed)
 Anticoagulation Management Gregory May is a 63 y.o. male who reports to the clinic for monitoring of warfarin treatment.    Indication: Central artery retinal occlusion, Right retinal embolism, long term current use of oral anticoagulation with warfarin with target INR range 2.0 - 3.0.   Duration: indefinite Supervising physician: Driscilla George, MD  Anticoagulation Clinic Visit History: Patient does not report signs/symptoms of bleeding or thromboembolism  Other recent changes: No diet, edications, lifestyle Anticoagulation Episode Summary     Current INR goal:  2.0-3.0  TTR:  73.0% (1.7 y)  Next INR check:  01/20/2024  INR from last check:  2.1 (12/23/2023)  Weekly max warfarin dose:  --  Target end date:  --  INR check location:  --  Preferred lab:  --  Send INR reminders to:  --   Indications   Pulmonary emboli (HCC) (Resolved) [I26.99] DVT (deep venous thrombosis) (HCC) (Resolved) [I82.409] Retinal artery occlusion central [H34.10] Right retinal embolus [H34.9] Long term (current) use of anticoagulants [Z79.01]        Comments:  --         No Known Allergies  Current Outpatient Medications:    Accu-Chek FastClix Lancets MISC, Use Accu Chek Fastclix lancets to check blood sugar three times daily. DX:E11.65, Disp: 300 each, Rfl: 2   ACCU-CHEK GUIDE test strip, TEST BLOOD SUGAR THREE TIMES DAILY AS DIRECTED, Disp: 300 strip, Rfl: 3   acetaminophen  (TYLENOL ) 500 MG tablet, Take 1,000 mg by mouth as needed for mild pain or moderate pain., Disp: , Rfl:    APPLE CIDER VINEGAR PO, Take 1 tablet by mouth daily., Disp: , Rfl:    ascorbic acid  (VITAMIN C ) 1000 MG tablet, Take 1 tablet (1,000 mg total) by mouth daily., Disp: , Rfl:    fluticasone  (FLONASE ) 50 MCG/ACT nasal spray, USE 2 SPRAY(S) IN EACH NOSTRIL AT BEDTIME AS NEEDED FOR  SINUS  DRAINAGE, Disp: 16 g, Rfl: 0   gabapentin  (NEURONTIN ) 300 MG capsule, TAKE 1 CAPSULE FOUR TIMES DAILY AS NEEDED, Disp: 360 capsule,  Rfl: 3   HYDROcodone -acetaminophen  (NORCO/VICODIN) 5-325 MG tablet, Take 1 tablet by mouth every 6 (six) hours as needed for moderate pain., Disp: 30 tablet, Rfl: 0   insulin  aspart (NOVOLOG ) 100 UNIT/ML injection, Take 5 units with breakfast and 10 units with supper, okay to increase up to 15 units with supper for hyperglycemia., Disp: 10 mL, Rfl: 4   Insulin  Syringes, Disposable, U-100 0.5 ML MISC, Use to inject insulin , Disp: 100 each, Rfl: 2   metFORMIN  (GLUCOPHAGE -XR) 750 MG 24 hr tablet, Take 2 tablets (1,500 mg total) by mouth daily., Disp: 180 tablet, Rfl: 3   Multiple Vitamin (MULTIVITAMIN WITH MINERALS) TABS tablet, Take 1 tablet by mouth every morning. Centrum, Disp: , Rfl:    pioglitazone  (ACTOS ) 30 MG tablet, Take 1 tablet (30 mg total) by mouth daily., Disp: 90 tablet, Rfl: 3   prednisoLONE  acetate (PRED FORTE ) 1 % ophthalmic suspension, Place 1 drop into the right eye every evening., Disp: , Rfl:    rosuvastatin  (CRESTOR ) 20 MG tablet, Take 1 tablet (20 mg total) by mouth daily., Disp: 90 tablet, Rfl: 3   warfarin (COUMADIN ) 5 MG tablet, Take 2 (two) of your 5 mg peach-colored warfarin tablets on Sundays, Mondays, Wednesdays, Fridays and Saturdays.. All OTHER DAYS (Tuesdays and Thursdays), take two and one-half (2 & 1/2) tablets., Disp: 60 tablet, Rfl: 3   zinc  sulfate 220 (50 Zn) MG capsule, Take 1 capsule (220 mg total) by  mouth daily., Disp: , Rfl:    doxycycline  (VIBRA -TABS) 100 MG tablet, Take 1 tablet (100 mg total) by mouth 2 (two) times daily. (Patient not taking: Reported on 10/09/2023), Disp: 28 tablet, Rfl: 0   empagliflozin  (JARDIANCE ) 25 MG TABS tablet, Take 1 tablet (25 mg total) by mouth daily. (Patient not taking: Reported on 10/09/2023), Disp: 90 tablet, Rfl: 3 Past Medical History:  Diagnosis Date   Arthritis    bilateral hips   Cutaneous abscess of left foot    Deep vein thrombosis (DVT) (HCC)    Diabetes mellitus    type II   Diabetic ulcer of heel (HCC)     Right heel   DJD (degenerative joint disease)    DVT (deep venous thrombosis) (HCC) 02/08/2014   Proximal provoked. Date of diagnosis February 08 2014 Duration of anticoagulation: 6 months. End date 08/12/2014.  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 Duration of anticoagulation: 8 months. End date 10/11/2014.  Anticoagulant: Lovenox  120 units daily Switched to Eliquis  on 05/25/2014 per patient preference    Pulmonary embolism (HCC)    Sebaceous cyst    on back of neck   Social History   Socioeconomic History   Marital status: Single    Spouse name: Not on file   Number of children: 0   Years of education: Not on file   Highest education level: Not on file  Occupational History   Occupation: disabled  Tobacco Use   Smoking status: Never   Smokeless tobacco: Never  Vaping Use   Vaping status: Never Used  Substance and Sexual Activity   Alcohol use: Not Currently    Comment: beer and mixed drink maybe 5  times a month   Drug use: No   Sexual activity: Never  Other Topics Concern   Not on file  Social History Narrative   Not on file   Social Drivers of Health   Financial Resource Strain: Low Risk  (01/16/2023)   Overall Financial Resource Strain (CARDIA)    Difficulty of Paying Living Expenses: Not hard at all  Food Insecurity: No Food Insecurity (01/16/2023)   Hunger Vital Sign    Worried About Running Out of Food in the Last Year: Never true    Ran Out of Food in the Last Year: Never true  Transportation Needs: No Transportation Needs (01/16/2023)   PRAPARE - Administrator, Civil Service (Medical): No     Lack of Transportation (Non-Medical): No  Physical Activity: Inactive (01/16/2023)   Exercise Vital Sign    Days of Exercise per Week: 0 days    Minutes of Exercise per Session: 0 min  Stress: No Stress Concern Present (01/16/2023)   [employer] of Occupational Health - Occupational Stress Questionnaire    Feeling of Stress : Only a little  Social Connections: Socially Isolated (01/16/2023)   Social Connection and Isolation Panel [NHANES]    Frequency of Communication with Friends and Family: More than three times a week    Frequency of Social Gatherings with Friends and Family: Twice a week    Attends Religious Services: Never  Active Member of Clubs or Organizations: No    Attends Banker Meetings: Never    Marital Status: Never married   Family History  Problem Relation Age of Onset   Breast cancer Mother    Cancer Mother        small intestine   Liver cancer Mother    Diabetes Mother    Diabetes Father    Diabetes Brother    Hypertension Maternal Grandmother    Heart Problems Maternal Grandmother    Diabetes Paternal Grandmother    Diabetes Paternal Grandfather    Diabetes Brother     ASSESSMENT Recent Results: The most recent result is correlated with No  mg per week: Lab Results  Component Value Date   INR 2.1 12/23/2023   INR 3.0 12/02/2023   INR 3.0 11/04/2023    Anticoagulation Dosing: Description   Take two (2) of your 5 mg peach-colored warfarin tablets, by mouth, once-daily, every day. If antibiotics are added by any physician, please let us  know so warfarin dose can be adjusted if necessary.      INR today: Therapeutic  PLAN Weekly dose was increased by 8% to 70 mg per week  Patient Instructions  Patient instructed to take medications as defined in the Anti-coagulation Track section of this encounter.  Patient instructed to take today's dose.  Patient instructed to take two (2) of your 5 mg peach-colored warfarin tablets, by mouth,  once-daily, every day. If antibiotics are added by any physician, please let us  know so warfarin dose can be adjusted if necessary.  Patient verbalized understanding of these instructions.  Patient advised to contact clinic or seek medical attention if signs/symptoms of bleeding or thromboembolism occur.  Patient verbalized understanding by repeating back information and was advised to contact me if further medication-related questions arise. Patient was also provided an information handout.  Follow-up Return in 4 weeks (on 01/20/2024) for Follow up INR.  Kenda Paula, PharmD, CPP Clinical Pharmacist Practitioner  15 minutes spent face-to-face with the patient during the encounter. 50% of time spent on education, including signs/sx bleeding and clotting, as well as food and drug interactions with warfarin. 50% of time was spent on fingerprick POC INR sample collection,processing, results determination, and documentation in [email or web address].

## 2023-12-24 ENCOUNTER — Encounter (HOSPITAL_BASED_OUTPATIENT_CLINIC_OR_DEPARTMENT_OTHER): Attending: General Surgery | Admitting: Internal Medicine

## 2023-12-24 DIAGNOSIS — Z89512 Acquired absence of left leg below knee: Secondary | ICD-10-CM | POA: Insufficient documentation

## 2023-12-24 DIAGNOSIS — E11622 Type 2 diabetes mellitus with other skin ulcer: Secondary | ICD-10-CM | POA: Diagnosis not present

## 2023-12-24 DIAGNOSIS — T8789 Other complications of amputation stump: Secondary | ICD-10-CM | POA: Diagnosis not present

## 2023-12-24 DIAGNOSIS — Z89511 Acquired absence of right leg below knee: Secondary | ICD-10-CM | POA: Insufficient documentation

## 2023-12-24 DIAGNOSIS — L97822 Non-pressure chronic ulcer of other part of left lower leg with fat layer exposed: Secondary | ICD-10-CM | POA: Insufficient documentation

## 2023-12-24 NOTE — Progress Notes (Signed)
Evaluation and management procedures were performed by the Clinical Pharmacy Practitioner under my supervision and collaboration. I have reviewed the Practitioner's note and chart, and I agree with the management and plan as documented above. ° °

## 2023-12-25 DIAGNOSIS — Z7984 Long term (current) use of oral hypoglycemic drugs: Secondary | ICD-10-CM | POA: Diagnosis not present

## 2023-12-25 DIAGNOSIS — Z993 Dependence on wheelchair: Secondary | ICD-10-CM | POA: Diagnosis not present

## 2023-12-25 DIAGNOSIS — L97822 Non-pressure chronic ulcer of other part of left lower leg with fat layer exposed: Secondary | ICD-10-CM | POA: Diagnosis not present

## 2023-12-25 DIAGNOSIS — Z794 Long term (current) use of insulin: Secondary | ICD-10-CM | POA: Diagnosis not present

## 2023-12-25 DIAGNOSIS — E11622 Type 2 diabetes mellitus with other skin ulcer: Secondary | ICD-10-CM | POA: Diagnosis not present

## 2023-12-25 DIAGNOSIS — Z8673 Personal history of transient ischemic attack (TIA), and cerebral infarction without residual deficits: Secondary | ICD-10-CM | POA: Diagnosis not present

## 2023-12-25 DIAGNOSIS — Z7901 Long term (current) use of anticoagulants: Secondary | ICD-10-CM | POA: Diagnosis not present

## 2023-12-25 DIAGNOSIS — E1142 Type 2 diabetes mellitus with diabetic polyneuropathy: Secondary | ICD-10-CM | POA: Diagnosis not present

## 2023-12-25 DIAGNOSIS — E7800 Pure hypercholesterolemia, unspecified: Secondary | ICD-10-CM | POA: Diagnosis not present

## 2023-12-25 DIAGNOSIS — Z89511 Acquired absence of right leg below knee: Secondary | ICD-10-CM | POA: Diagnosis not present

## 2023-12-25 DIAGNOSIS — K760 Fatty (change of) liver, not elsewhere classified: Secondary | ICD-10-CM | POA: Diagnosis not present

## 2023-12-25 DIAGNOSIS — G4733 Obstructive sleep apnea (adult) (pediatric): Secondary | ICD-10-CM | POA: Diagnosis not present

## 2023-12-25 DIAGNOSIS — H3411 Central retinal artery occlusion, right eye: Secondary | ICD-10-CM | POA: Diagnosis not present

## 2023-12-25 DIAGNOSIS — Z96643 Presence of artificial hip joint, bilateral: Secondary | ICD-10-CM | POA: Diagnosis not present

## 2023-12-25 DIAGNOSIS — Z86718 Personal history of other venous thrombosis and embolism: Secondary | ICD-10-CM | POA: Diagnosis not present

## 2023-12-25 DIAGNOSIS — Z89512 Acquired absence of left leg below knee: Secondary | ICD-10-CM | POA: Diagnosis not present

## 2023-12-25 DIAGNOSIS — Z9181 History of falling: Secondary | ICD-10-CM | POA: Diagnosis not present

## 2023-12-26 DIAGNOSIS — M6281 Muscle weakness (generalized): Secondary | ICD-10-CM | POA: Diagnosis not present

## 2023-12-26 DIAGNOSIS — Z89512 Acquired absence of left leg below knee: Secondary | ICD-10-CM | POA: Diagnosis not present

## 2023-12-26 DIAGNOSIS — M160 Bilateral primary osteoarthritis of hip: Secondary | ICD-10-CM | POA: Diagnosis not present

## 2023-12-26 DIAGNOSIS — Z4781 Encounter for orthopedic aftercare following surgical amputation: Secondary | ICD-10-CM | POA: Diagnosis not present

## 2023-12-26 DIAGNOSIS — R2681 Unsteadiness on feet: Secondary | ICD-10-CM | POA: Diagnosis not present

## 2024-01-01 DIAGNOSIS — Z8673 Personal history of transient ischemic attack (TIA), and cerebral infarction without residual deficits: Secondary | ICD-10-CM | POA: Diagnosis not present

## 2024-01-01 DIAGNOSIS — G4733 Obstructive sleep apnea (adult) (pediatric): Secondary | ICD-10-CM | POA: Diagnosis not present

## 2024-01-01 DIAGNOSIS — Z993 Dependence on wheelchair: Secondary | ICD-10-CM | POA: Diagnosis not present

## 2024-01-01 DIAGNOSIS — H3411 Central retinal artery occlusion, right eye: Secondary | ICD-10-CM | POA: Diagnosis not present

## 2024-01-01 DIAGNOSIS — Z794 Long term (current) use of insulin: Secondary | ICD-10-CM | POA: Diagnosis not present

## 2024-01-01 DIAGNOSIS — Z7901 Long term (current) use of anticoagulants: Secondary | ICD-10-CM | POA: Diagnosis not present

## 2024-01-01 DIAGNOSIS — Z9181 History of falling: Secondary | ICD-10-CM | POA: Diagnosis not present

## 2024-01-01 DIAGNOSIS — L97822 Non-pressure chronic ulcer of other part of left lower leg with fat layer exposed: Secondary | ICD-10-CM | POA: Diagnosis not present

## 2024-01-01 DIAGNOSIS — Z86718 Personal history of other venous thrombosis and embolism: Secondary | ICD-10-CM | POA: Diagnosis not present

## 2024-01-01 DIAGNOSIS — E1142 Type 2 diabetes mellitus with diabetic polyneuropathy: Secondary | ICD-10-CM | POA: Diagnosis not present

## 2024-01-01 DIAGNOSIS — Z89512 Acquired absence of left leg below knee: Secondary | ICD-10-CM | POA: Diagnosis not present

## 2024-01-01 DIAGNOSIS — Z96643 Presence of artificial hip joint, bilateral: Secondary | ICD-10-CM | POA: Diagnosis not present

## 2024-01-01 DIAGNOSIS — E7800 Pure hypercholesterolemia, unspecified: Secondary | ICD-10-CM | POA: Diagnosis not present

## 2024-01-01 DIAGNOSIS — Z7984 Long term (current) use of oral hypoglycemic drugs: Secondary | ICD-10-CM | POA: Diagnosis not present

## 2024-01-01 DIAGNOSIS — Z89511 Acquired absence of right leg below knee: Secondary | ICD-10-CM | POA: Diagnosis not present

## 2024-01-01 DIAGNOSIS — E11622 Type 2 diabetes mellitus with other skin ulcer: Secondary | ICD-10-CM | POA: Diagnosis not present

## 2024-01-01 DIAGNOSIS — K760 Fatty (change of) liver, not elsewhere classified: Secondary | ICD-10-CM | POA: Diagnosis not present

## 2024-01-08 ENCOUNTER — Ambulatory Visit: Payer: Self-pay | Admitting: Endocrinology

## 2024-01-08 ENCOUNTER — Ambulatory Visit (INDEPENDENT_AMBULATORY_CARE_PROVIDER_SITE_OTHER): Payer: Medicare Other | Admitting: Endocrinology

## 2024-01-08 ENCOUNTER — Encounter: Payer: Self-pay | Admitting: Endocrinology

## 2024-01-08 VITALS — BP 138/70 | HR 68 | Resp 16 | Ht 72.0 in | Wt 198.0 lb

## 2024-01-08 DIAGNOSIS — Z794 Long term (current) use of insulin: Secondary | ICD-10-CM

## 2024-01-08 DIAGNOSIS — Z7984 Long term (current) use of oral hypoglycemic drugs: Secondary | ICD-10-CM | POA: Diagnosis not present

## 2024-01-08 DIAGNOSIS — G4733 Obstructive sleep apnea (adult) (pediatric): Secondary | ICD-10-CM | POA: Diagnosis not present

## 2024-01-08 DIAGNOSIS — Z993 Dependence on wheelchair: Secondary | ICD-10-CM | POA: Diagnosis not present

## 2024-01-08 DIAGNOSIS — H3411 Central retinal artery occlusion, right eye: Secondary | ICD-10-CM | POA: Diagnosis not present

## 2024-01-08 DIAGNOSIS — Z9181 History of falling: Secondary | ICD-10-CM | POA: Diagnosis not present

## 2024-01-08 DIAGNOSIS — E7800 Pure hypercholesterolemia, unspecified: Secondary | ICD-10-CM | POA: Diagnosis not present

## 2024-01-08 DIAGNOSIS — Z89511 Acquired absence of right leg below knee: Secondary | ICD-10-CM | POA: Diagnosis not present

## 2024-01-08 DIAGNOSIS — Z86718 Personal history of other venous thrombosis and embolism: Secondary | ICD-10-CM | POA: Diagnosis not present

## 2024-01-08 DIAGNOSIS — E1142 Type 2 diabetes mellitus with diabetic polyneuropathy: Secondary | ICD-10-CM | POA: Diagnosis not present

## 2024-01-08 DIAGNOSIS — E1165 Type 2 diabetes mellitus with hyperglycemia: Secondary | ICD-10-CM | POA: Diagnosis not present

## 2024-01-08 DIAGNOSIS — Z96643 Presence of artificial hip joint, bilateral: Secondary | ICD-10-CM | POA: Diagnosis not present

## 2024-01-08 DIAGNOSIS — Z89512 Acquired absence of left leg below knee: Secondary | ICD-10-CM | POA: Diagnosis not present

## 2024-01-08 DIAGNOSIS — K760 Fatty (change of) liver, not elsewhere classified: Secondary | ICD-10-CM | POA: Diagnosis not present

## 2024-01-08 DIAGNOSIS — Z7901 Long term (current) use of anticoagulants: Secondary | ICD-10-CM | POA: Diagnosis not present

## 2024-01-08 DIAGNOSIS — E11622 Type 2 diabetes mellitus with other skin ulcer: Secondary | ICD-10-CM | POA: Diagnosis not present

## 2024-01-08 DIAGNOSIS — L97822 Non-pressure chronic ulcer of other part of left lower leg with fat layer exposed: Secondary | ICD-10-CM | POA: Diagnosis not present

## 2024-01-08 DIAGNOSIS — Z8673 Personal history of transient ischemic attack (TIA), and cerebral infarction without residual deficits: Secondary | ICD-10-CM | POA: Diagnosis not present

## 2024-01-08 LAB — POCT GLYCOSYLATED HEMOGLOBIN (HGB A1C): Hemoglobin A1C: 6.6 % — AB (ref 4.0–5.6)
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
## 2024-01-14 ENCOUNTER — Encounter (HOSPITAL_BASED_OUTPATIENT_CLINIC_OR_DEPARTMENT_OTHER): Attending: General Surgery | Admitting: General Surgery

## 2024-01-14 ENCOUNTER — Telehealth: Payer: Self-pay

## 2024-01-14 DIAGNOSIS — Z89512 Acquired absence of left leg below knee: Secondary | ICD-10-CM | POA: Insufficient documentation

## 2024-01-14 DIAGNOSIS — E11622 Type 2 diabetes mellitus with other skin ulcer: Secondary | ICD-10-CM | POA: Insufficient documentation

## 2024-01-14 DIAGNOSIS — Z89511 Acquired absence of right leg below knee: Secondary | ICD-10-CM | POA: Insufficient documentation

## 2024-01-14 DIAGNOSIS — L97822 Non-pressure chronic ulcer of other part of left lower leg with fat layer exposed: Secondary | ICD-10-CM | POA: Diagnosis not present

## 2024-01-14 DIAGNOSIS — T8789 Other complications of amputation stump: Secondary | ICD-10-CM | POA: Diagnosis not present

## 2024-01-14 NOTE — Telephone Encounter (Signed)
 Received patient's patient assistance meds. Received Novolog  flex pen. Patient called and made aware.

## 2024-01-15 ENCOUNTER — Telehealth: Payer: Self-pay | Admitting: *Deleted

## 2024-01-15 DIAGNOSIS — Z8673 Personal history of transient ischemic attack (TIA), and cerebral infarction without residual deficits: Secondary | ICD-10-CM | POA: Diagnosis not present

## 2024-01-15 DIAGNOSIS — Z9181 History of falling: Secondary | ICD-10-CM | POA: Diagnosis not present

## 2024-01-15 DIAGNOSIS — Z89512 Acquired absence of left leg below knee: Secondary | ICD-10-CM | POA: Diagnosis not present

## 2024-01-15 DIAGNOSIS — H3411 Central retinal artery occlusion, right eye: Secondary | ICD-10-CM | POA: Diagnosis not present

## 2024-01-15 DIAGNOSIS — Z86718 Personal history of other venous thrombosis and embolism: Secondary | ICD-10-CM | POA: Diagnosis not present

## 2024-01-15 DIAGNOSIS — Z7901 Long term (current) use of anticoagulants: Secondary | ICD-10-CM | POA: Diagnosis not present

## 2024-01-15 DIAGNOSIS — E1142 Type 2 diabetes mellitus with diabetic polyneuropathy: Secondary | ICD-10-CM | POA: Diagnosis not present

## 2024-01-15 DIAGNOSIS — G4733 Obstructive sleep apnea (adult) (pediatric): Secondary | ICD-10-CM | POA: Diagnosis not present

## 2024-01-15 DIAGNOSIS — Z89511 Acquired absence of right leg below knee: Secondary | ICD-10-CM | POA: Diagnosis not present

## 2024-01-15 DIAGNOSIS — L97822 Non-pressure chronic ulcer of other part of left lower leg with fat layer exposed: Secondary | ICD-10-CM | POA: Diagnosis not present

## 2024-01-15 DIAGNOSIS — Z993 Dependence on wheelchair: Secondary | ICD-10-CM | POA: Diagnosis not present

## 2024-01-15 DIAGNOSIS — Z7984 Long term (current) use of oral hypoglycemic drugs: Secondary | ICD-10-CM | POA: Diagnosis not present

## 2024-01-15 DIAGNOSIS — E7800 Pure hypercholesterolemia, unspecified: Secondary | ICD-10-CM | POA: Diagnosis not present

## 2024-01-15 DIAGNOSIS — Z794 Long term (current) use of insulin: Secondary | ICD-10-CM | POA: Diagnosis not present

## 2024-01-15 DIAGNOSIS — Z96643 Presence of artificial hip joint, bilateral: Secondary | ICD-10-CM | POA: Diagnosis not present

## 2024-01-15 DIAGNOSIS — E11622 Type 2 diabetes mellitus with other skin ulcer: Secondary | ICD-10-CM | POA: Diagnosis not present

## 2024-01-15 DIAGNOSIS — K760 Fatty (change of) liver, not elsewhere classified: Secondary | ICD-10-CM | POA: Diagnosis not present

## 2024-01-15 NOTE — Telephone Encounter (Signed)
 Copied from CRM [id number]. Topic: Clinical - Order For Equipment >> Jan 15, 2024  2:03 PM Lenon Radar A wrote: Reason for CRM: [employer] called in wanting to know if we received fax for medical supplies. Reached out to [facility], confirmed fax was sent back this morning and that patient needs to schedule appointment because he hasn't been seen since last year. Notified [employer]. Please reach out to patient to schedule appointment.

## 2024-01-15 NOTE — Telephone Encounter (Signed)
 LOV - 11/2022.Called pt to schedule an appt - no answer; left message on pt's self identified vm to call the office.

## 2024-01-16 ENCOUNTER — Telehealth: Payer: Self-pay | Admitting: Orthopedic Surgery

## 2024-01-16 NOTE — Telephone Encounter (Signed)
 Gregory May with [facility] states they will continue nursing care for one more week and also requesting a PT [name] call back number [phone]

## 2024-01-16 NOTE — Telephone Encounter (Signed)
I called pt again no answer-

## 2024-01-17 ENCOUNTER — Ambulatory Visit: Admitting: Family

## 2024-01-17 ENCOUNTER — Encounter: Payer: Self-pay | Admitting: Family

## 2024-01-17 ENCOUNTER — Other Ambulatory Visit: Payer: Self-pay | Admitting: Pharmacist

## 2024-01-17 DIAGNOSIS — Z89512 Acquired absence of left leg below knee: Secondary | ICD-10-CM | POA: Diagnosis not present

## 2024-01-17 MED ORDER — WARFARIN SODIUM 5 MG PO TABS: 10.0000 mg | ORAL_TABLET | Freq: Every day | ORAL | 3 refills | Status: DC

## 2024-01-17 NOTE — Telephone Encounter (Signed)
 Refill requested. Current instructions are: Take two (2) of your peach-colored, 5 mg strength warfarin tablets by mouth, once-daily at 4PM each day. Number 56 tablets, 3 REFILLS sent to his designated pharmacy.

## 2024-01-17 NOTE — Progress Notes (Addendum)
 Office Visit Note   Patient: Gregory May           Date of Birth: Feb 12, 1961           MRN: [id number] Visit Date: 01/17/2024              Requested by: Fernand Prost, MD [address] [place], [address] [place],  [place] [address] PCP: Fernand Prost, MD  Chief Complaint  Patient presents with   Left Leg - Follow-up    Hx Left BKA      HPI: The patient is a 63 year old gentleman who presents in follow-up for left below-knee amputation.  He did have a prolonged ulceration to this which is now healed.  He had been following at the wound center.  Today he presents for evaluation of the residual limb as well as an order for prosthesis set up on the left  [facility] [employer] [facility] [employer]   Patient is an existing bilateral transtibial  amputee.  Patient's current comorbidities are not expected to impact the ability to function with the prescribed prosthesis. Patient verbally communicates a strong desire to use a prosthesis. Patient currently requires mobility aids to ambulate without a prosthesis.  Expects not to use mobility aids with a new prosthesis.  Patient is a K2 level ambulator that will use a prosthesis to walk around their home and the community over low level environmental barriers.          Assessment & Plan: Visit Diagnoses: No diagnosis found.  Plan: Order for prosthesis set up on the left.  Patient will follow-up as needed  Follow-Up Instructions: Return if symptoms worsen or fail to improve.   Ortho Exam  Patient is alert, oriented, no adenopathy, well-dressed, normal affect, normal respiratory effort. On examination left residual limb this is well consolidated well-healed there is no open area no ulcer no impending skin breakdown    Imaging: No results found. No images are attached to the encounter.  Labs: Lab Results  Component Value Date   HGBA1C 6.6 (A) 01/08/2024   HGBA1C 6.8 (H) 10/01/2023   HGBA1C 6.4 07/02/2023   ESRSEDRATE 11 11/15/2021    ESRSEDRATE 25 (H) 08/19/2021   ESRSEDRATE 8 07/15/2019   CRP 0.7 11/17/2021   CRP <0.5 11/15/2021   CRP 1.7 (H) 08/19/2021   REPTSTATUS 10/03/2022 FINAL 09/28/2022   REPTSTATUS 10/03/2022 FINAL 09/28/2022   GRAMSTAIN  08/21/2021    WBC PRESENT,BOTH PMN AND MONONUCLEAR NO ORGANISMS SEEN    CULT  09/28/2022    NO GROWTH 5 DAYS Performed at [facility], [address] [place], [place] [address]    CULT  09/28/2022    NO GROWTH 5 DAYS Performed at [facility], [address], [place], [place] [address]    [facility] STAPHYLOCOCCUS HOMINIS 08/21/2021     Lab Results  Component Value Date   ALBUMIN 4.2 07/02/2023   ALBUMIN 3.8 12/26/2022   ALBUMIN 2.8 (L) 09/28/2022    Lab Results  Component Value Date   MG 1.9 11/15/2021   Lab Results  Component Value Date   VD25OH 16 (L) 11/05/2013    No results found for: PREALBUMIN    Latest Ref Rng & Units 10/03/2022    2:13 AM 10/01/2022    5:02 AM 09/30/2022    4:06 AM  CBC EXTENDED  WBC 4.0 - 10.5 K/uL 12.8  12.6  14.0   RBC 4.22 - 5.81 MIL/uL 4.26  4.25  3.84  Hemoglobin 13.0 - 17.0 g/dL 86.7  87.1  88.1   HCT 39.0 - 52.0 % 39.1  39.4  36.3   Platelets 150 - 400 K/uL 413  369  325      There is no height or weight on file to calculate BMI.  Orders:  No orders of the defined types were placed in this encounter.  No orders of the defined types were placed in this encounter.    Procedures: No procedures performed  Clinical Data: No additional findings.  ROS:  All other systems negative, except as noted in the HPI. Review of Systems  Objective: Vital Signs: There were no vitals taken for this visit.  Specialty Comments:  No specialty comments available.  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) 10/03/2022   Lower extremity cellulitis 09/29/2022   Acute osteomyelitis of left foot (HCC) 09/29/2022    PFO with atrial septal aneurysm    Long term (current) use of anticoagulants 02/26/2022   Right retinal embolus 01/15/2022   DM type 2 with diabetic peripheral neuropathy (HCC) 01/15/2022   Obstructive sleep apnea 01/15/2022   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   Transportation insecurity 11/23/2021   Retinal artery occlusion, central 11/15/2021   Epidermal inclusion cyst 10/18/2021   Amputated finger 09/05/2021   Osteomyelitis of finger of right hand (HCC) 08/19/2021   Hepatic steatosis 06/24/2020   Back pain 04/20/2020   Left knee pain 04/20/2020   Allergic sinusitis 04/20/2020   Elevated liver enzymes 04/20/2020   Cutaneous abscess of left foot    Diastasis of rectus abdominis 07/16/2019   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 10/07/2018   Essential hypertension 06/19/2018   Peripheral neuropathy 12/19/2017   Left below-knee amputee (HCC) 04/17/2017   Carpal tunnel syndrome 03/18/2017   Healthcare maintenance 06/21/2015   Status post below knee amputation of right lower extremity (HCC) 06/07/2015   Diabetes mellitus with carpal tunnel syndrome (HCC) 12/09/2014   DJD (degenerative joint disease) of knee 07/21/2014   Osteoarthritis, hip, bilateral 05/25/2014   Onychomycosis 10/14/2013   Pure hypercholesterolemia 09/25/2013   Type 2 diabetes with complication (HCC) 09/15/1997   Past Medical History:  Diagnosis Date   Arthritis    bilateral hips   Cutaneous abscess of left foot    Deep vein thrombosis (DVT) (HCC)    Diabetes mellitus    type II   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 Date of diagnosis February 08 2014 Duration of anticoagulation: 6 months. End date 08/12/2014.  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 Duration of anticoagulation: 8 months. End date 10/11/2014.  Anticoagulant: Lovenox  120 units daily Switched to Eliquis  on 05/25/2014 per patient preference    Pulmonary embolism (HCC)    Sebaceous cyst    on back of neck    Family History  Problem Relation Age of Onset  Breast cancer Mother    Cancer Mother        small intestine   Liver cancer Mother    Diabetes Mother    Diabetes Father    Diabetes Brother    Hypertension Maternal Grandmother    Heart Problems Maternal Grandmother    Diabetes Paternal Grandmother    Diabetes Paternal Grandfather    Diabetes Brother     Past Surgical History:  Procedure Laterality Date   AMPUTATION Right 06/03/2015   Procedure: Right Below Knee Amputation;  Surgeon: Jerona Harden GAILS, MD;  Location: [facility] OR;  Service: Orthopedics;  Laterality: Right;   AMPUTATION Left 07/24/2019   Procedure: LEFT FOOT 3RD RAY AMPUTATION;  Surgeon: Harden Jerona GAILS, MD;  Location: [facility] OR;  Service: Orthopedics;  Laterality: Left;   AMPUTATION Left 06/23/2021   Procedure: LEFT 2ND TOE AMPUTATION;  Surgeon: Harden Jerona GAILS, MD;  Location: [facility] OR;  Service: Orthopedics;  Laterality: Left;   AMPUTATION Right 08/21/2021   Procedure: AMPUTATION RIGHT INDEX FINGER;  Surgeon: Alyse Agent, MD;  Location: [facility] OR;  Service: Orthopedics;  Laterality: Right;   AMPUTATION Left 09/29/2022   Procedure: AMPUTATION BELOW KNEE;  Surgeon: Harden Jerona GAILS, MD;  Location: [facility] OR;  Service: Orthopedics;  Laterality: Left;   BUBBLE STUDY  01/24/2022   Procedure: BUBBLE STUDY;  Surgeon: Delford Maude BROCKS, MD;  Location: [facility] ENDOSCOPY;  Service: Cardiovascular;;   CLOSED REDUCTION WITH HUMER PIN INSERTION  1974   left hip   HARDWARE REMOVAL  Left 07/21/2014   Procedure: HARDWARE REMOVAL;  Surgeon: Toribio JULIANNA Chancy, MD;  Location: [facility] OR;  Service: Orthopedics;  Laterality: Left;   I & D EXTREMITY Right 08/21/2021   Procedure: IRRIGATION AND DEBRIDEMENT RIGHT INDEX FINGER;  Surgeon: Alyse Agent, MD;  Location: [facility] OR;  Service: Orthopedics;  Laterality: Right;   PATENT FORAMEN OVALE(PFO) CLOSURE N/A 02/28/2022   Procedure: PATENT FORAMEN OVALE(PFO) CLOSURE;  Surgeon: Wonda Sharper, MD;  Location: [facility] INVASIVE CV LAB;  Service: Cardiovascular;  Laterality: N/A;   ROTATOR CUFF REPAIR Right 2005 (approx)   TEE WITHOUT CARDIOVERSION N/A 01/24/2022   Procedure: TRANSESOPHAGEAL ECHOCARDIOGRAM (TEE);  Surgeon: Delford Maude BROCKS, MD;  Location: [facility] ENDOSCOPY;  Service: Cardiovascular;  Laterality: N/A;   TOTAL HIP ARTHROPLASTY Left 07/21/2014   Procedure: TOTAL HIP ARTHROPLASTY ANTERIOR APPROACH;  Surgeon: Toribio JULIANNA Chancy, MD;  Location: [facility] OR;  Service: Orthopedics;  Laterality: Left;   TOTAL HIP ARTHROPLASTY Right 2006 (approx)   right hip replaced   Social History   Occupational History   Occupation: disabled  Tobacco Use   Smoking status: Never   Smokeless tobacco: Never  Vaping Use   Vaping status: Never Used  Substance and Sexual Activity   Alcohol use: Not Currently    Comment: beer and mixed drink maybe 5  times a month   Drug use: No   Sexual activity: Never

## 2024-01-17 NOTE — Telephone Encounter (Signed)
 Left VO approval on her confidential VM

## 2024-01-20 ENCOUNTER — Ambulatory Visit: Admitting: Pharmacist

## 2024-01-20 DIAGNOSIS — H3411 Central retinal artery occlusion, right eye: Secondary | ICD-10-CM

## 2024-01-20 DIAGNOSIS — Z7901 Long term (current) use of anticoagulants: Secondary | ICD-10-CM | POA: Diagnosis not present

## 2024-01-20 DIAGNOSIS — H349 Unspecified retinal vascular occlusion: Secondary | ICD-10-CM

## 2024-01-20 LAB — POCT INR: INR: 2.8 (ref 2.0–3.0)

## 2024-01-20 NOTE — Progress Notes (Signed)
 Anticoagulation Management Gregory May is a 63 y.o. male who reports to the clinic for monitoring of warfarin treatment.    Indication: Retinal artery occlusion, central; right retinal embolism; long term current use of oral anticoagulation with warfarin. Target INR 2.0 - 3.0.  Duration: indefinite Supervising physician: Driscilla George, MD  Anticoagulation Clinic Visit History: Patient does not report signs/symptoms of bleeding or thromboembolism  Other recent changes: No diet, medications, lifestyle changes endorsed at this visit by the patient.  Anticoagulation Episode Summary     Current INR goal:  2.0-3.0  TTR:  74.2% (1.7 y)  Next INR check:  02/17/2024  INR from last check:  2.8 (01/20/2024)  Weekly max warfarin dose:  --  Target end date:  --  INR check location:  --  Preferred lab:  --  Send INR reminders to:  --   Indications   Pulmonary emboli (HCC) (Resolved) [I26.99] DVT (deep venous thrombosis) (HCC) (Resolved) [I82.409] Retinal artery occlusion central [H34.10] Right retinal embolus [H34.9] Long term (current) use of anticoagulants [Z79.01]        Comments:  --         No Known Allergies  Current Outpatient Medications:    Accu-Chek FastClix Lancets MISC, Use Accu Chek Fastclix lancets to check blood sugar three times daily. DX:E11.65, Disp: 300 each, Rfl: 2   ACCU-CHEK GUIDE test strip, TEST BLOOD SUGAR THREE TIMES DAILY AS DIRECTED, Disp: 300 strip, Rfl: 3   acetaminophen  (TYLENOL ) 500 MG tablet, Take 1,000 mg by mouth as needed for mild pain or moderate pain., Disp: , Rfl:    APPLE CIDER VINEGAR PO, Take 1 tablet by mouth daily., Disp: , Rfl:    ascorbic acid  (VITAMIN C ) 1000 MG tablet, Take 1 tablet (1,000 mg total) by mouth daily., Disp: , Rfl:    empagliflozin  (JARDIANCE ) 25 MG TABS tablet, Take 1 tablet (25 mg total) by mouth daily., Disp: 90 tablet, Rfl: 3   fluticasone  (FLONASE ) 50 MCG/ACT nasal spray, USE 2 SPRAY(S) IN EACH NOSTRIL AT BEDTIME  AS NEEDED FOR  SINUS  DRAINAGE, Disp: 16 g, Rfl: 0   gabapentin  (NEURONTIN ) 300 MG capsule, TAKE 1 CAPSULE FOUR TIMES DAILY AS NEEDED, Disp: 360 capsule, Rfl: 3   HYDROcodone -acetaminophen  (NORCO/VICODIN) 5-325 MG tablet, Take 1 tablet by mouth every 6 (six) hours as needed for moderate pain., Disp: 30 tablet, Rfl: 0   insulin  aspart (NOVOLOG ) 100 UNIT/ML injection, Take 5 units with breakfast and 10 units with supper, okay to increase up to 15 units with supper for hyperglycemia., Disp: 10 mL, Rfl: 4   Insulin  Syringes, Disposable, U-100 0.5 ML MISC, Use to inject insulin , Disp: 100 each, Rfl: 2   metFORMIN  (GLUCOPHAGE -XR) 750 MG 24 hr tablet, Take 2 tablets (1,500 mg total) by mouth daily., Disp: 180 tablet, Rfl: 3   Multiple Vitamin (MULTIVITAMIN WITH MINERALS) TABS tablet, Take 1 tablet by mouth every morning. Centrum, Disp: , Rfl:    pioglitazone  (ACTOS ) 30 MG tablet, Take 1 tablet (30 mg total) by mouth daily., Disp: 90 tablet, Rfl: 3   prednisoLONE  acetate (PRED FORTE ) 1 % ophthalmic suspension, Place 1 drop into the right eye every evening., Disp: , Rfl:    rosuvastatin  (CRESTOR ) 20 MG tablet, Take 1 tablet (20 mg total) by mouth daily., Disp: 90 tablet, Rfl: 3   warfarin (COUMADIN ) 5 MG tablet, Take 2 tablets (10 mg total) by mouth daily at 4 PM., Disp: 56 tablet, Rfl: 3   zinc  sulfate 220 (50 Zn)  MG capsule, Take 1 capsule (220 mg total) by mouth daily., Disp: , Rfl:    doxycycline  (VIBRA -TABS) 100 MG tablet, Take 1 tablet (100 mg total) by mouth 2 (two) times daily. (Patient not taking: Reported on 01/20/2024), Disp: 28 tablet, Rfl: 0 Past Medical History:  Diagnosis Date   Arthritis    bilateral hips   Cutaneous abscess of left foot    Deep vein thrombosis (DVT) (HCC)    Diabetes mellitus    type II   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 Date of diagnosis February 08 2014  Duration of anticoagulation: 6 months. End date 08/12/2014.  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 Duration of anticoagulation: 8 months. End date 10/11/2014.  Anticoagulant: Lovenox  120 units daily Switched to Eliquis  on 05/25/2014 per patient preference    Pulmonary embolism (HCC)    Sebaceous cyst    on back of neck   Social History   Socioeconomic History   Marital status: Single    Spouse name: Not on file   Number of children: 0   Years of education: Not on file   Highest education level: Not on file  Occupational History   Occupation: disabled  Tobacco Use   Smoking status: Never   Smokeless tobacco: Never  Vaping Use   Vaping status: Never Used  Substance and Sexual Activity   Alcohol use: Not Currently    Comment: beer and mixed drink maybe 5  times a month   Drug use: No   Sexual activity: Never  Other Topics Concern   Not on file  Social History Narrative   Not on file   Social Drivers of Health   Financial Resource Strain: Low Risk  (01/16/2023)   Overall Financial Resource Strain (CARDIA)    Difficulty of Paying Living Expenses: Not hard at all  Food Insecurity: No Food Insecurity (01/16/2023)   Hunger Vital Sign    Worried About Running Out of Food in the Last Year: Never true    Ran Out of Food in the Last Year: Never true  Transportation Needs: No Transportation Needs (01/16/2023)   PRAPARE - Administrator, Civil Service (Medical): No    Lack of Transportation (Non-Medical): No  Physical Activity: Inactive (01/16/2023)   Exercise Vital Sign    Days of Exercise per Week: 0 days     Minutes of Exercise per Session: 0 min  Stress: No Stress Concern Present (01/16/2023)   [employer] of Occupational Health - Occupational Stress Questionnaire    Feeling of Stress : Only a little  Social Connections: Socially Isolated (01/16/2023)   Social Connection and Isolation Panel [NHANES]    Frequency of Communication with Friends and Family: More than three times a week    Frequency of Social Gatherings with Friends and Family: Twice a week    Attends Religious Services: Never    Database administrator or Organizations: No    Attends Banker Meetings: Never  Marital Status: Never married   Family History  Problem Relation Age of Onset   Breast cancer Mother    Cancer Mother        small intestine   Liver cancer Mother    Diabetes Mother    Diabetes Father    Diabetes Brother    Hypertension Maternal Grandmother    Heart Problems Maternal Grandmother    Diabetes Paternal Grandmother    Diabetes Paternal Grandfather    Diabetes Brother     ASSESSMENT Recent Results: The most recent result is correlated with 70 mg per week: Lab Results  Component Value Date   INR 2.8 01/20/2024   INR 2.1 12/23/2023   INR 3.0 12/02/2023    Anticoagulation Dosing: Description   Take two (2) of your 5 mg peach-colored warfarin tablets, by mouth, once-daily, every day. If antibiotics are added by any physician, please let us  know so warfarin dose can be adjusted if necessary.      INR today: Therapeutic  PLAN Weekly dose was unchanged. Continue to take 2 of your 5 mg peach colored warfarin tablets by mouth, once daily for a total of 70 mg warfarin per week.   Patient Instructions  Patient instructed to take medications as defined in the Anti-coagulation Track section of this encounter.  Patient instructed to take today's dose.  Patient instructed to take two (2) of your 5 mg peach-colored warfarin tablets, by mouth, once-daily, every day. If antibiotics are added  by any physician, please let us  know so warfarin dose can be adjusted if necessary.  Patient verbalized understanding of these instructions.  Patient advised to contact clinic or seek medical attention if signs/symptoms of bleeding or thromboembolism occur.  Patient verbalized understanding by repeating back information and was advised to contact me if further medication-related questions arise. Patient was also provided an information handout.  Follow-up Return in 4 weeks (on 02/17/2024) for Follow up INR.  Kenda Paula, PharmD, CPP Clinical Pharmacist Practitioner  15 minutes spent face-to-face with the patient during the encounter. 50% of time spent on education, including signs/sx bleeding and clotting, as well as food and drug interactions with warfarin. 50% of time was spent on fingerprick POC INR sample collection,processing, results determination, and documentation in [email or web address].

## 2024-01-20 NOTE — Telephone Encounter (Signed)
 Patient picked up patient assistance - Log Noted

## 2024-01-20 NOTE — Patient Instructions (Signed)
 Patient instructed to take medications as defined in the Anti-coagulation Track section of this encounter.  Patient instructed to take today's dose.  Patient instructed to take two (2) of your 5 mg peach-colored warfarin tablets, by mouth, once-daily, every day. If antibiotics are added by any physician, please let us  know so warfarin dose can be adjusted if necessary.  Patient verbalized understanding of these instructions.

## 2024-01-21 DIAGNOSIS — L97822 Non-pressure chronic ulcer of other part of left lower leg with fat layer exposed: Secondary | ICD-10-CM | POA: Diagnosis not present

## 2024-01-21 DIAGNOSIS — Z7984 Long term (current) use of oral hypoglycemic drugs: Secondary | ICD-10-CM | POA: Diagnosis not present

## 2024-01-21 DIAGNOSIS — Z993 Dependence on wheelchair: Secondary | ICD-10-CM | POA: Diagnosis not present

## 2024-01-21 DIAGNOSIS — Z89512 Acquired absence of left leg below knee: Secondary | ICD-10-CM | POA: Diagnosis not present

## 2024-01-21 DIAGNOSIS — Z96643 Presence of artificial hip joint, bilateral: Secondary | ICD-10-CM | POA: Diagnosis not present

## 2024-01-21 DIAGNOSIS — E7800 Pure hypercholesterolemia, unspecified: Secondary | ICD-10-CM | POA: Diagnosis not present

## 2024-01-21 DIAGNOSIS — Z89511 Acquired absence of right leg below knee: Secondary | ICD-10-CM | POA: Diagnosis not present

## 2024-01-21 DIAGNOSIS — H3411 Central retinal artery occlusion, right eye: Secondary | ICD-10-CM | POA: Diagnosis not present

## 2024-01-21 DIAGNOSIS — E1142 Type 2 diabetes mellitus with diabetic polyneuropathy: Secondary | ICD-10-CM | POA: Diagnosis not present

## 2024-01-21 DIAGNOSIS — Z9181 History of falling: Secondary | ICD-10-CM | POA: Diagnosis not present

## 2024-01-21 DIAGNOSIS — E11622 Type 2 diabetes mellitus with other skin ulcer: Secondary | ICD-10-CM | POA: Diagnosis not present

## 2024-01-21 DIAGNOSIS — Z7901 Long term (current) use of anticoagulants: Secondary | ICD-10-CM | POA: Diagnosis not present

## 2024-01-21 DIAGNOSIS — K760 Fatty (change of) liver, not elsewhere classified: Secondary | ICD-10-CM | POA: Diagnosis not present

## 2024-01-21 DIAGNOSIS — G4733 Obstructive sleep apnea (adult) (pediatric): Secondary | ICD-10-CM | POA: Diagnosis not present

## 2024-01-21 DIAGNOSIS — Z794 Long term (current) use of insulin: Secondary | ICD-10-CM | POA: Diagnosis not present

## 2024-01-21 DIAGNOSIS — Z8673 Personal history of transient ischemic attack (TIA), and cerebral infarction without residual deficits: Secondary | ICD-10-CM | POA: Diagnosis not present

## 2024-01-21 DIAGNOSIS — Z86718 Personal history of other venous thrombosis and embolism: Secondary | ICD-10-CM | POA: Diagnosis not present

## 2024-01-21 NOTE — Progress Notes (Signed)
Evaluation and management procedures were performed by the Clinical Pharmacy Practitioner under my supervision and collaboration. I have reviewed the Practitioner's note and chart, and I agree with the management and plan as documented above. ° °

## 2024-01-22 DIAGNOSIS — H3411 Central retinal artery occlusion, right eye: Secondary | ICD-10-CM | POA: Diagnosis not present

## 2024-01-22 DIAGNOSIS — Z89511 Acquired absence of right leg below knee: Secondary | ICD-10-CM | POA: Diagnosis not present

## 2024-01-22 DIAGNOSIS — Z9181 History of falling: Secondary | ICD-10-CM | POA: Diagnosis not present

## 2024-01-22 DIAGNOSIS — E11622 Type 2 diabetes mellitus with other skin ulcer: Secondary | ICD-10-CM | POA: Diagnosis not present

## 2024-01-22 DIAGNOSIS — E7800 Pure hypercholesterolemia, unspecified: Secondary | ICD-10-CM | POA: Diagnosis not present

## 2024-01-22 DIAGNOSIS — Z86718 Personal history of other venous thrombosis and embolism: Secondary | ICD-10-CM | POA: Diagnosis not present

## 2024-01-22 DIAGNOSIS — Z8673 Personal history of transient ischemic attack (TIA), and cerebral infarction without residual deficits: Secondary | ICD-10-CM | POA: Diagnosis not present

## 2024-01-22 DIAGNOSIS — Z7984 Long term (current) use of oral hypoglycemic drugs: Secondary | ICD-10-CM | POA: Diagnosis not present

## 2024-01-22 DIAGNOSIS — Z7901 Long term (current) use of anticoagulants: Secondary | ICD-10-CM | POA: Diagnosis not present

## 2024-01-22 DIAGNOSIS — Z96643 Presence of artificial hip joint, bilateral: Secondary | ICD-10-CM | POA: Diagnosis not present

## 2024-01-22 DIAGNOSIS — K760 Fatty (change of) liver, not elsewhere classified: Secondary | ICD-10-CM | POA: Diagnosis not present

## 2024-01-22 DIAGNOSIS — E1142 Type 2 diabetes mellitus with diabetic polyneuropathy: Secondary | ICD-10-CM | POA: Diagnosis not present

## 2024-01-22 DIAGNOSIS — Z89512 Acquired absence of left leg below knee: Secondary | ICD-10-CM | POA: Diagnosis not present

## 2024-01-22 DIAGNOSIS — Z993 Dependence on wheelchair: Secondary | ICD-10-CM | POA: Diagnosis not present

## 2024-01-22 DIAGNOSIS — L97822 Non-pressure chronic ulcer of other part of left lower leg with fat layer exposed: Secondary | ICD-10-CM | POA: Diagnosis not present

## 2024-01-22 DIAGNOSIS — Z794 Long term (current) use of insulin: Secondary | ICD-10-CM | POA: Diagnosis not present

## 2024-01-22 DIAGNOSIS — G4733 Obstructive sleep apnea (adult) (pediatric): Secondary | ICD-10-CM | POA: Diagnosis not present

## 2024-01-27 ENCOUNTER — Encounter: Payer: Self-pay | Admitting: *Deleted

## 2024-01-27 DIAGNOSIS — L97822 Non-pressure chronic ulcer of other part of left lower leg with fat layer exposed: Secondary | ICD-10-CM | POA: Diagnosis not present

## 2024-01-27 DIAGNOSIS — Z9181 History of falling: Secondary | ICD-10-CM | POA: Diagnosis not present

## 2024-01-27 DIAGNOSIS — Z89511 Acquired absence of right leg below knee: Secondary | ICD-10-CM | POA: Diagnosis not present

## 2024-01-27 DIAGNOSIS — Z7984 Long term (current) use of oral hypoglycemic drugs: Secondary | ICD-10-CM | POA: Diagnosis not present

## 2024-01-27 DIAGNOSIS — E11622 Type 2 diabetes mellitus with other skin ulcer: Secondary | ICD-10-CM | POA: Diagnosis not present

## 2024-01-27 DIAGNOSIS — Z86718 Personal history of other venous thrombosis and embolism: Secondary | ICD-10-CM | POA: Diagnosis not present

## 2024-01-27 DIAGNOSIS — Z89512 Acquired absence of left leg below knee: Secondary | ICD-10-CM | POA: Diagnosis not present

## 2024-01-27 DIAGNOSIS — Z96643 Presence of artificial hip joint, bilateral: Secondary | ICD-10-CM | POA: Diagnosis not present

## 2024-01-27 DIAGNOSIS — H3411 Central retinal artery occlusion, right eye: Secondary | ICD-10-CM | POA: Diagnosis not present

## 2024-01-27 DIAGNOSIS — G4733 Obstructive sleep apnea (adult) (pediatric): Secondary | ICD-10-CM | POA: Diagnosis not present

## 2024-01-27 DIAGNOSIS — Z794 Long term (current) use of insulin: Secondary | ICD-10-CM | POA: Diagnosis not present

## 2024-01-27 DIAGNOSIS — Z7901 Long term (current) use of anticoagulants: Secondary | ICD-10-CM | POA: Diagnosis not present

## 2024-01-27 DIAGNOSIS — K760 Fatty (change of) liver, not elsewhere classified: Secondary | ICD-10-CM | POA: Diagnosis not present

## 2024-01-27 DIAGNOSIS — E7800 Pure hypercholesterolemia, unspecified: Secondary | ICD-10-CM | POA: Diagnosis not present

## 2024-01-27 DIAGNOSIS — E1142 Type 2 diabetes mellitus with diabetic polyneuropathy: Secondary | ICD-10-CM | POA: Diagnosis not present

## 2024-01-27 DIAGNOSIS — Z993 Dependence on wheelchair: Secondary | ICD-10-CM | POA: Diagnosis not present

## 2024-01-27 DIAGNOSIS — Z8673 Personal history of transient ischemic attack (TIA), and cerebral infarction without residual deficits: Secondary | ICD-10-CM | POA: Diagnosis not present

## 2024-01-27 NOTE — Telephone Encounter (Signed)
 Pt called wanting Mikayla or Julio Ohm to call him regarding his left prosthetic being ordered please [phone]

## 2024-01-28 ENCOUNTER — Telehealth: Payer: Self-pay

## 2024-01-28 NOTE — Telephone Encounter (Signed)
 Patient is asking for a return call at [phone]. Stated this is his 4th time calling and have not gotten a return. He is wanting to speak to someone in regards to a prescription for his prosthetic leg. He is asking to please call him.

## 2024-01-28 NOTE — Telephone Encounter (Signed)
 I let pt know that this is my first message/call in regards to this issue. He said that [employer] has been trying to get in touch with us ? Stating that the Rx written from 01/17/24 hasn't been received by them. Can you please advise.

## 2024-01-28 NOTE — Telephone Encounter (Signed)
 Pt is needing rx for [facility] that Erin completed at last visit. Duplicate message will sign off on this and await message from [place].

## 2024-01-29 NOTE — Telephone Encounter (Signed)
 I don't recall his specifically however, we have received from them. Is it not with all of Duda's things to sign?

## 2024-01-29 NOTE — Telephone Encounter (Signed)
 Have we gotten anyting from [facility]?

## 2024-01-29 NOTE — Telephone Encounter (Signed)
 Thanks

## 2024-01-31 ENCOUNTER — Telehealth: Payer: Self-pay | Admitting: Orthopedic Surgery

## 2024-01-31 NOTE — Telephone Encounter (Signed)
 Pt called requesting a call back from [place]. He state [facility] was to send a script for prosthetic leg. Please call pt at [phone]

## 2024-02-03 DIAGNOSIS — Z7984 Long term (current) use of oral hypoglycemic drugs: Secondary | ICD-10-CM | POA: Diagnosis not present

## 2024-02-03 DIAGNOSIS — L97822 Non-pressure chronic ulcer of other part of left lower leg with fat layer exposed: Secondary | ICD-10-CM | POA: Diagnosis not present

## 2024-02-03 DIAGNOSIS — E11622 Type 2 diabetes mellitus with other skin ulcer: Secondary | ICD-10-CM | POA: Diagnosis not present

## 2024-02-03 DIAGNOSIS — Z89511 Acquired absence of right leg below knee: Secondary | ICD-10-CM | POA: Diagnosis not present

## 2024-02-03 DIAGNOSIS — K760 Fatty (change of) liver, not elsewhere classified: Secondary | ICD-10-CM | POA: Diagnosis not present

## 2024-02-03 DIAGNOSIS — G4733 Obstructive sleep apnea (adult) (pediatric): Secondary | ICD-10-CM | POA: Diagnosis not present

## 2024-02-03 DIAGNOSIS — Z89512 Acquired absence of left leg below knee: Secondary | ICD-10-CM | POA: Diagnosis not present

## 2024-02-03 DIAGNOSIS — Z86718 Personal history of other venous thrombosis and embolism: Secondary | ICD-10-CM | POA: Diagnosis not present

## 2024-02-03 DIAGNOSIS — Z8673 Personal history of transient ischemic attack (TIA), and cerebral infarction without residual deficits: Secondary | ICD-10-CM | POA: Diagnosis not present

## 2024-02-03 DIAGNOSIS — Z9181 History of falling: Secondary | ICD-10-CM | POA: Diagnosis not present

## 2024-02-03 DIAGNOSIS — Z993 Dependence on wheelchair: Secondary | ICD-10-CM | POA: Diagnosis not present

## 2024-02-03 DIAGNOSIS — H3411 Central retinal artery occlusion, right eye: Secondary | ICD-10-CM | POA: Diagnosis not present

## 2024-02-03 DIAGNOSIS — Z794 Long term (current) use of insulin: Secondary | ICD-10-CM | POA: Diagnosis not present

## 2024-02-03 DIAGNOSIS — E1142 Type 2 diabetes mellitus with diabetic polyneuropathy: Secondary | ICD-10-CM | POA: Diagnosis not present

## 2024-02-03 DIAGNOSIS — E7800 Pure hypercholesterolemia, unspecified: Secondary | ICD-10-CM | POA: Diagnosis not present

## 2024-02-03 DIAGNOSIS — Z96643 Presence of artificial hip joint, bilateral: Secondary | ICD-10-CM | POA: Diagnosis not present

## 2024-02-03 DIAGNOSIS — Z7901 Long term (current) use of anticoagulants: Secondary | ICD-10-CM | POA: Diagnosis not present

## 2024-02-03 NOTE — Telephone Encounter (Signed)
 The form has been received by [facility] today. We have printed out his OV note and are going to get Dr. Harden to sign the order form and fax in to [facility] this morning.

## 2024-02-03 NOTE — Telephone Encounter (Signed)
 faxed

## 2024-02-13 DIAGNOSIS — Z8673 Personal history of transient ischemic attack (TIA), and cerebral infarction without residual deficits: Secondary | ICD-10-CM | POA: Diagnosis not present

## 2024-02-13 DIAGNOSIS — Z86718 Personal history of other venous thrombosis and embolism: Secondary | ICD-10-CM | POA: Diagnosis not present

## 2024-02-13 DIAGNOSIS — E1142 Type 2 diabetes mellitus with diabetic polyneuropathy: Secondary | ICD-10-CM | POA: Diagnosis not present

## 2024-02-13 DIAGNOSIS — Z96643 Presence of artificial hip joint, bilateral: Secondary | ICD-10-CM | POA: Diagnosis not present

## 2024-02-13 DIAGNOSIS — Z9181 History of falling: Secondary | ICD-10-CM | POA: Diagnosis not present

## 2024-02-13 DIAGNOSIS — H3411 Central retinal artery occlusion, right eye: Secondary | ICD-10-CM | POA: Diagnosis not present

## 2024-02-13 DIAGNOSIS — Z7984 Long term (current) use of oral hypoglycemic drugs: Secondary | ICD-10-CM | POA: Diagnosis not present

## 2024-02-13 DIAGNOSIS — Z794 Long term (current) use of insulin: Secondary | ICD-10-CM | POA: Diagnosis not present

## 2024-02-13 DIAGNOSIS — E11622 Type 2 diabetes mellitus with other skin ulcer: Secondary | ICD-10-CM | POA: Diagnosis not present

## 2024-02-13 DIAGNOSIS — Z7901 Long term (current) use of anticoagulants: Secondary | ICD-10-CM | POA: Diagnosis not present

## 2024-02-13 DIAGNOSIS — E7800 Pure hypercholesterolemia, unspecified: Secondary | ICD-10-CM | POA: Diagnosis not present

## 2024-02-13 DIAGNOSIS — Z89512 Acquired absence of left leg below knee: Secondary | ICD-10-CM | POA: Diagnosis not present

## 2024-02-13 DIAGNOSIS — Z89511 Acquired absence of right leg below knee: Secondary | ICD-10-CM | POA: Diagnosis not present

## 2024-02-13 DIAGNOSIS — K760 Fatty (change of) liver, not elsewhere classified: Secondary | ICD-10-CM | POA: Diagnosis not present

## 2024-02-13 DIAGNOSIS — G4733 Obstructive sleep apnea (adult) (pediatric): Secondary | ICD-10-CM | POA: Diagnosis not present

## 2024-02-13 DIAGNOSIS — L97822 Non-pressure chronic ulcer of other part of left lower leg with fat layer exposed: Secondary | ICD-10-CM | POA: Diagnosis not present

## 2024-02-13 DIAGNOSIS — Z993 Dependence on wheelchair: Secondary | ICD-10-CM | POA: Diagnosis not present

## 2024-02-17 ENCOUNTER — Ambulatory Visit: Admitting: Pharmacist

## 2024-02-17 DIAGNOSIS — Z7901 Long term (current) use of anticoagulants: Secondary | ICD-10-CM

## 2024-02-17 DIAGNOSIS — H3410 Central retinal artery occlusion, unspecified eye: Secondary | ICD-10-CM | POA: Diagnosis not present

## 2024-02-17 DIAGNOSIS — H349 Unspecified retinal vascular occlusion: Secondary | ICD-10-CM

## 2024-02-17 LAB — POCT INR: INR: 3 (ref 2.0–3.0)

## 2024-02-17 NOTE — Progress Notes (Signed)
 Anticoagulation Management Gregory May is a 63 y.o. male who reports to the clinic for monitoring of warfarin treatment.    Indication: Retinal artery occlusion, Central; Right retinal embolism; long term current use of oral anticoagulation with warfarin. Target INR range 2.0 - 3.0.   Duration: indefinite Supervising physician: Mliss LABOR. Trudy, MD  Anticoagulation Clinic Visit History: Patient does not report signs/symptoms of bleeding or thromboembolism  Other recent changes: No diet, medications, lifestyle changes endorsed by the patient at this visit.  Anticoagulation Episode Summary     Current INR goal:  2.0-3.0  TTR:  75.3% (1.8 y)  Next INR check:  03/16/2024  INR from last check:  3.0 (02/17/2024)  Weekly max warfarin dose:  --  Target end date:  --  INR check location:  --  Preferred lab:  --  Send INR reminders to:  --   Indications   Pulmonary emboli (HCC) (Resolved) [I26.99] DVT (deep venous thrombosis) (HCC) (Resolved) [I82.409] Retinal artery occlusion central [H34.10] Right retinal embolus [H34.9] Long term (current) use of anticoagulants [Z79.01]        Comments:  --         No Known Allergies  Current Outpatient Medications:    Accu-Chek FastClix Lancets MISC, Use Accu Chek Fastclix lancets to check blood sugar three times daily. DX:E11.65, Disp: 300 each, Rfl: 2   ACCU-CHEK GUIDE test strip, TEST BLOOD SUGAR THREE TIMES DAILY AS DIRECTED, Disp: 300 strip, Rfl: 3   acetaminophen  (TYLENOL ) 500 MG tablet, Take 1,000 mg by mouth as needed for mild pain or moderate pain., Disp: , Rfl:    APPLE CIDER VINEGAR PO, Take 1 tablet by mouth daily., Disp: , Rfl:    ascorbic acid  (VITAMIN C ) 1000 MG tablet, Take 1 tablet (1,000 mg total) by mouth daily., Disp: , Rfl:    doxycycline  (VIBRA -TABS) 100 MG tablet, Take 1 tablet (100 mg total) by mouth 2 (two) times daily. (Patient not taking: Reported on 01/20/2024), Disp: 28 tablet, Rfl: 0   empagliflozin   (JARDIANCE ) 25 MG TABS tablet, Take 1 tablet (25 mg total) by mouth daily., Disp: 90 tablet, Rfl: 3   fluticasone  (FLONASE ) 50 MCG/ACT nasal spray, USE 2 SPRAY(S) IN EACH NOSTRIL AT BEDTIME AS NEEDED FOR  SINUS  DRAINAGE, Disp: 16 g, Rfl: 0   gabapentin  (NEURONTIN ) 300 MG capsule, TAKE 1 CAPSULE FOUR TIMES DAILY AS NEEDED, Disp: 360 capsule, Rfl: 3   HYDROcodone -acetaminophen  (NORCO/VICODIN) 5-325 MG tablet, Take 1 tablet by mouth every 6 (six) hours as needed for moderate pain., Disp: 30 tablet, Rfl: 0   insulin  aspart (NOVOLOG ) 100 UNIT/ML injection, Take 5 units with breakfast and 10 units with supper, okay to increase up to 15 units with supper for hyperglycemia., Disp: 10 mL, Rfl: 4   Insulin  Syringes, Disposable, U-100 0.5 ML MISC, Use to inject insulin , Disp: 100 each, Rfl: 2   metFORMIN  (GLUCOPHAGE -XR) 750 MG 24 hr tablet, Take 2 tablets (1,500 mg total) by mouth daily., Disp: 180 tablet, Rfl: 3   Multiple Vitamin (MULTIVITAMIN WITH MINERALS) TABS tablet, Take 1 tablet by mouth every morning. Centrum, Disp: , Rfl:    pioglitazone  (ACTOS ) 30 MG tablet, Take 1 tablet (30 mg total) by mouth daily., Disp: 90 tablet, Rfl: 3   prednisoLONE  acetate (PRED FORTE ) 1 % ophthalmic suspension, Place 1 drop into the right eye every evening., Disp: , Rfl:    rosuvastatin  (CRESTOR ) 20 MG tablet, Take 1 tablet (20 mg total) by mouth daily., Disp: 90 tablet,  Rfl: 3   warfarin (COUMADIN ) 5 MG tablet, Take 2 tablets (10 mg total) by mouth daily at 4 PM., Disp: 56 tablet, Rfl: 3   zinc  sulfate 220 (50 Zn) MG capsule, Take 1 capsule (220 mg total) by mouth daily., Disp: , Rfl:  Past Medical History:  Diagnosis Date   Arthritis    bilateral hips   Cutaneous abscess of left foot    Deep vein thrombosis (DVT) (HCC)    Diabetes mellitus    type II   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 Date of  diagnosis February 08 2014 Duration of anticoagulation: 6 months. End date 08/12/2014.  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 Duration of anticoagulation: 8 months. End date 10/11/2014.  Anticoagulant: Lovenox  120 units daily Switched to Eliquis  on 05/25/2014 per patient preference    Pulmonary embolism (HCC)    Sebaceous cyst    on back of neck   Social History   Socioeconomic History   Marital status: Single    Spouse name: Not on file   Number of children: 0   Years of education: Not on file   Highest education level: Not on file  Occupational History   Occupation: disabled  Tobacco Use   Smoking status: Never   Smokeless tobacco: Never  Vaping Use   Vaping status: Never Used  Substance and Sexual Activity   Alcohol use: Not Currently    Comment: beer and mixed drink maybe 5  times a month   Drug use: No   Sexual activity: Never  Other Topics Concern   Not on file  Social History Narrative   Not on file   Social Drivers of Health   Financial Resource Strain: Low Risk  (01/16/2023)   Overall Financial Resource Strain (CARDIA)    Difficulty of Paying Living Expenses: Not hard at all  Food Insecurity: No Food Insecurity (01/16/2023)   Hunger Vital Sign    Worried About Running Out of Food in the Last Year: Never true    Ran Out of Food in the Last Year: Never true  Transportation Needs: No Transportation Needs (01/16/2023)   PRAPARE - Administrator, Civil Service (Medical): No    Lack of Transportation (Non-Medical): No  Physical Activity: Inactive (01/16/2023)   Exercise Vital Sign    Days of Exercise  per Week: 0 days    Minutes of Exercise per Session: 0 min  Stress: No Stress Concern Present (01/16/2023)   [employer] of Occupational Health - Occupational Stress Questionnaire    Feeling of Stress : Only a little  Social Connections: Socially Isolated (01/16/2023)   Social Connection and Isolation Panel    Frequency of Communication with Friends and Family: More than three times a week    Frequency of Social Gatherings with Friends and Family: Twice a week    Attends Religious Services: Never    Database administrator or Organizations: No    Attends Banker  Meetings: Never    Marital Status: Never married   Family History  Problem Relation Age of Onset   Breast cancer Mother    Cancer Mother        small intestine   Liver cancer Mother    Diabetes Mother    Diabetes Father    Diabetes Brother    Hypertension Maternal Grandmother    Heart Problems Maternal Grandmother    Diabetes Paternal Grandmother    Diabetes Paternal Grandfather    Diabetes Brother     ASSESSMENT Recent Results: The most recent result is correlated with 70 mg per week: Lab Results  Component Value Date   INR 3.0 02/17/2024   INR 2.8 01/20/2024   INR 2.1 12/23/2023    Anticoagulation Dosing: Description   Take two (2) of your 5 mg peach-colored warfarin tablets, by mouth, once-daily, all days of the week EXCEPT on MONDAYS, take ONLY one-and-one-half (1 & 1/2) tablets on MONDAYS.     INR today: Therapeutic  PLAN Weekly dose was decreased by 4% to 67.5 mg per week  Patient Instructions  Patient instructed to take medications as defined in the Anti-coagulation Track section of this encounter.  Patient instructed to take today's dose.  Patient instructed to take  two (2) of your 5 mg peach-colored warfarin tablets, by mouth, once-daily, all days of the week EXCEPT on MONDAYS, take ONLY one-and-one-half (1 & 1/2) tablets on MONDAYS Patient verbalized understanding of these  instructions.  Patient advised to contact clinic or seek medical attention if signs/symptoms of bleeding or thromboembolism occur.  Patient verbalized understanding by repeating back information and was advised to contact me if further medication-related questions arise. Patient was also provided an information handout.  Follow-up Return in 4 weeks (on 03/16/2024) for Follow up INR.  Lynwood KATHEE Lites, PharmD, CPP Clinical Pharmacist Practitioner  15 minutes spent face-to-face with the patient during the encounter. 50% of time spent on education, including signs/sx bleeding and clotting, as well as food and drug interactions with warfarin. 50% of time was spent on fingerprick POC INR sample collection,processing, results determination, and documentation in [email or web address].

## 2024-02-17 NOTE — Patient Instructions (Signed)
 Patient instructed to take medications as defined in the Anti-coagulation Track section of this encounter.  Patient instructed to take today's dose.  Patient instructed to take  two (2) of your 5 mg peach-colored warfarin tablets, by mouth, once-daily, all days of the week EXCEPT on MONDAYS, take ONLY one-and-one-half (1 & 1/2) tablets on MONDAYS Patient verbalized understanding of these instructions.

## 2024-02-18 ENCOUNTER — Telehealth: Payer: Self-pay | Admitting: Orthopedic Surgery

## 2024-02-18 DIAGNOSIS — Z8673 Personal history of transient ischemic attack (TIA), and cerebral infarction without residual deficits: Secondary | ICD-10-CM | POA: Diagnosis not present

## 2024-02-18 DIAGNOSIS — Z89511 Acquired absence of right leg below knee: Secondary | ICD-10-CM | POA: Diagnosis not present

## 2024-02-18 DIAGNOSIS — L97822 Non-pressure chronic ulcer of other part of left lower leg with fat layer exposed: Secondary | ICD-10-CM | POA: Diagnosis not present

## 2024-02-18 DIAGNOSIS — Z7984 Long term (current) use of oral hypoglycemic drugs: Secondary | ICD-10-CM | POA: Diagnosis not present

## 2024-02-18 DIAGNOSIS — Z86718 Personal history of other venous thrombosis and embolism: Secondary | ICD-10-CM | POA: Diagnosis not present

## 2024-02-18 DIAGNOSIS — Z794 Long term (current) use of insulin: Secondary | ICD-10-CM | POA: Diagnosis not present

## 2024-02-18 DIAGNOSIS — Z7901 Long term (current) use of anticoagulants: Secondary | ICD-10-CM | POA: Diagnosis not present

## 2024-02-18 DIAGNOSIS — Z9181 History of falling: Secondary | ICD-10-CM | POA: Diagnosis not present

## 2024-02-18 DIAGNOSIS — G4733 Obstructive sleep apnea (adult) (pediatric): Secondary | ICD-10-CM | POA: Diagnosis not present

## 2024-02-18 DIAGNOSIS — Z96643 Presence of artificial hip joint, bilateral: Secondary | ICD-10-CM | POA: Diagnosis not present

## 2024-02-18 DIAGNOSIS — H3411 Central retinal artery occlusion, right eye: Secondary | ICD-10-CM | POA: Diagnosis not present

## 2024-02-18 DIAGNOSIS — E7800 Pure hypercholesterolemia, unspecified: Secondary | ICD-10-CM | POA: Diagnosis not present

## 2024-02-18 DIAGNOSIS — E1142 Type 2 diabetes mellitus with diabetic polyneuropathy: Secondary | ICD-10-CM | POA: Diagnosis not present

## 2024-02-18 DIAGNOSIS — K760 Fatty (change of) liver, not elsewhere classified: Secondary | ICD-10-CM | POA: Diagnosis not present

## 2024-02-18 DIAGNOSIS — E11622 Type 2 diabetes mellitus with other skin ulcer: Secondary | ICD-10-CM | POA: Diagnosis not present

## 2024-02-18 DIAGNOSIS — Z993 Dependence on wheelchair: Secondary | ICD-10-CM | POA: Diagnosis not present

## 2024-02-18 DIAGNOSIS — Z89512 Acquired absence of left leg below knee: Secondary | ICD-10-CM | POA: Diagnosis not present

## 2024-02-18 NOTE — Telephone Encounter (Signed)
 Duwaine called from prostethics and send a letter on the 6/27, 07/3, and 07/8. Patient called several times so he can get his leg. CB#[phone]

## 2024-02-18 NOTE — Telephone Encounter (Signed)
 I know this has been done multiple times do you know what is going on?

## 2024-02-20 NOTE — Progress Notes (Signed)
 INTERNAL MEDICINE TEACHING ATTENDING ADDENDUM   I agree with these recommendations regarding anticoagulation management.   Charissa Bash, MD

## 2024-02-24 NOTE — Telephone Encounter (Addendum)
 Pt stated Dr Harden had done the paper but it was done incorrectly. Stated he going thru [facility]. Pt stated if has PCP can complete the paperwork. 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 Pt stated rep, Redell, from [employer] will be able to come with him to an appt if need be to be sure form is completed correctly. After talking to Chilon who called [employer] - they need certain documentation/notes in which Dr Crist office can provide; not our office; stated the prosthetic needs a certain device. I told pt this and it will be best if Dr Crist does the paperwork but we will see him on Monday 7/21 for check-up since his LOV was 11/26/22.

## 2024-02-24 NOTE — Telephone Encounter (Signed)
 Please see the message below in reference to the PT request.  Message was sent to Clerical Staff instead.  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  Please see message on 01/28/2024, 01/29/2024,01/31/2024/02/03/2024,  Copied from CRM #[id number]. Topic: Appointments - Transfer of Care >> Feb 24, 2024  1:10 PM [name] wrote: Pt is requesting to transfer FROM: Gregory Bitters, DO Pt is requesting to transfer TO: Anyone taking PT Reason for requested transfer: N/A-Patient got disconnected from last call on his LT amputated leg infection on 09/29/22-Paperwork for prosthetic leg. Needs app but none available. This would be Transfer of Care . Needs call back [phone]   It is the responsibility of the team the patient would like to transfer to (Dr. Myrna) to reach out to the patient if for any reason this transfer is not acceptable.

## 2024-02-25 ENCOUNTER — Telehealth: Payer: Self-pay | Admitting: *Deleted

## 2024-02-25 NOTE — Telephone Encounter (Signed)
 Copied from CRM [id number]. Topic: General - Other >> Feb 25, 2024  3:40 PM Susanna ORN wrote: Reason for CRM: Charmaine, with [facility], called to verify if patient was a patient of Dr. Ronnald Sergeant. Advised that he may be a patient under her care but the clinic is a resident ran facility. Charmaine stated she just wanted to make sure as she will be sending over some orders that need to be signed off on by Dr. Sergeant.

## 2024-02-25 NOTE — Telephone Encounter (Signed)
 Pt has not been sen since 11/2022. He does have an upcoming appt 03/02/24. There's no return telephone# for Charmaine [facility].

## 2024-02-25 NOTE — Telephone Encounter (Signed)
 I recently updated documentation - last week.  I called [employer] this am to see what further they require-- they dont open until 1 pm  We are happy to help with this, but have been having trouble ourselves. I thought I did what they needed last week.  Gregory May

## 2024-02-27 DIAGNOSIS — Z9181 History of falling: Secondary | ICD-10-CM | POA: Diagnosis not present

## 2024-02-27 DIAGNOSIS — Z89511 Acquired absence of right leg below knee: Secondary | ICD-10-CM | POA: Diagnosis not present

## 2024-02-27 DIAGNOSIS — Z96643 Presence of artificial hip joint, bilateral: Secondary | ICD-10-CM | POA: Diagnosis not present

## 2024-02-27 DIAGNOSIS — Z7901 Long term (current) use of anticoagulants: Secondary | ICD-10-CM | POA: Diagnosis not present

## 2024-02-27 DIAGNOSIS — Z89512 Acquired absence of left leg below knee: Secondary | ICD-10-CM | POA: Diagnosis not present

## 2024-02-27 DIAGNOSIS — G4733 Obstructive sleep apnea (adult) (pediatric): Secondary | ICD-10-CM | POA: Diagnosis not present

## 2024-02-27 DIAGNOSIS — K760 Fatty (change of) liver, not elsewhere classified: Secondary | ICD-10-CM | POA: Diagnosis not present

## 2024-02-27 DIAGNOSIS — Z86718 Personal history of other venous thrombosis and embolism: Secondary | ICD-10-CM | POA: Diagnosis not present

## 2024-02-27 DIAGNOSIS — E1142 Type 2 diabetes mellitus with diabetic polyneuropathy: Secondary | ICD-10-CM | POA: Diagnosis not present

## 2024-02-27 DIAGNOSIS — Z8673 Personal history of transient ischemic attack (TIA), and cerebral infarction without residual deficits: Secondary | ICD-10-CM | POA: Diagnosis not present

## 2024-02-27 DIAGNOSIS — E7800 Pure hypercholesterolemia, unspecified: Secondary | ICD-10-CM | POA: Diagnosis not present

## 2024-02-27 DIAGNOSIS — Z794 Long term (current) use of insulin: Secondary | ICD-10-CM | POA: Diagnosis not present

## 2024-02-27 DIAGNOSIS — H3411 Central retinal artery occlusion, right eye: Secondary | ICD-10-CM | POA: Diagnosis not present

## 2024-02-27 DIAGNOSIS — Z7984 Long term (current) use of oral hypoglycemic drugs: Secondary | ICD-10-CM | POA: Diagnosis not present

## 2024-02-27 NOTE — Progress Notes (Unsigned)
 Patient name: Gregory May Date of birth: 03-11-1961 Date of visit: 03/02/24  Type of visit: Established Patient Office Visit  Subjective   Chief concern:  Chief Complaint  Patient presents with   DME Order    Order for prothesis s/p BKA Feb 2024.      Gregory May is a 63 y.o. 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   Patient Active Problem List   Diagnosis Date Noted   Recurrent acute deep vein thrombosis (DVT) of lower extremity (HCC) 10/03/2022   Lower extremity cellulitis 09/29/2022   Acute osteomyelitis of left foot (HCC) 09/29/2022   PFO with atrial septal aneurysm    Long term (current) use of anticoagulants 02/26/2022   Right retinal embolus 01/15/2022   DM type 2 with diabetic peripheral neuropathy (HCC) 01/15/2022   Obstructive sleep apnea 01/15/2022   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   Transportation insecurity 11/23/2021   Retinal artery occlusion, central 11/15/2021   Epidermal inclusion cyst 10/18/2021   Amputated finger 09/05/2021   Osteomyelitis of finger of right hand (HCC) 08/19/2021   Hepatic steatosis 06/24/2020   Back pain 04/20/2020   Left knee pain 04/20/2020   Allergic sinusitis 04/20/2020   Elevated liver enzymes 04/20/2020   Cutaneous abscess of left foot    Diastasis of rectus abdominis 07/16/2019   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 10/07/2018   Essential hypertension 06/19/2018   Peripheral neuropathy 12/19/2017   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) 04/17/2017   Carpal tunnel syndrome 03/18/2017   Healthcare maintenance 06/21/2015   Status post below knee amputation of right lower extremity (HCC) 06/07/2015   Diabetes mellitus with carpal  tunnel syndrome (HCC) 12/09/2014   DJD (degenerative joint disease) of knee 07/21/2014   Osteoarthritis, hip, bilateral 05/25/2014   Onychomycosis 10/14/2013   Pure hypercholesterolemia 09/25/2013   Type 2 diabetes with complication (HCC) 09/15/1997     Past Surgical History:  Procedure Laterality Date   AMPUTATION Right 06/03/2015   Procedure: Right Below Knee Amputation;  Surgeon: Jerona Harden GAILS, MD;  Location: [facility] OR;  Service: Orthopedics;  Laterality: Right;   AMPUTATION Left 07/24/2019   Procedure: LEFT FOOT 3RD RAY AMPUTATION;  Surgeon: Harden Jerona GAILS, MD;  Location: [facility] OR;  Service: Orthopedics;  Laterality: Left;   AMPUTATION Left 06/23/2021   Procedure: LEFT 2ND TOE AMPUTATION;  Surgeon: Harden Jerona GAILS, MD;  Location: [facility] OR;  Service: Orthopedics;  Laterality: Left;   AMPUTATION Right 08/21/2021   Procedure: AMPUTATION RIGHT INDEX FINGER;  Surgeon: Alyse Agent, MD;  Location: [facility] OR;  Service: Orthopedics;  Laterality: Right;   AMPUTATION Left 09/29/2022   Procedure: AMPUTATION BELOW KNEE;  Surgeon: Harden Jerona GAILS, MD;  Location: [facility] OR;  Service: Orthopedics;  Laterality: Left;   BUBBLE STUDY  01/24/2022   Procedure: BUBBLE STUDY;  Surgeon: Delford Maude BROCKS, MD;  Location: [facility] ENDOSCOPY;  Service: Cardiovascular;;   CLOSED REDUCTION WITH HUMER PIN INSERTION  1974   left hip   HARDWARE REMOVAL Left 07/21/2014   Procedure: HARDWARE REMOVAL;  Surgeon: Toribio JULIANNA Chancy, MD;  Location: [facility] OR;  Service: Orthopedics;  Laterality: Left;   I & D EXTREMITY Right 08/21/2021   Procedure: IRRIGATION AND DEBRIDEMENT RIGHT INDEX FINGER;  Surgeon:  Alyse Agent, MD;  Location: [facility] OR;  Service: Orthopedics;  Laterality: Right;   PATENT FORAMEN OVALE(PFO) CLOSURE N/A 02/28/2022   Procedure: PATENT FORAMEN OVALE(PFO) CLOSURE;  Surgeon: Wonda Sharper, MD;  Location: [facility] INVASIVE CV LAB;  Service: Cardiovascular;  Laterality: N/A;   ROTATOR CUFF REPAIR Right 2005 (approx)   TEE WITHOUT CARDIOVERSION N/A  01/24/2022   Procedure: TRANSESOPHAGEAL ECHOCARDIOGRAM (TEE);  Surgeon: Delford Maude BROCKS, MD;  Location: [facility] ENDOSCOPY;  Service: Cardiovascular;  Laterality: N/A;   TOTAL HIP ARTHROPLASTY Left 07/21/2014   Procedure: TOTAL HIP ARTHROPLASTY ANTERIOR APPROACH;  Surgeon: Toribio JULIANNA Chancy, MD;  Location: [facility] OR;  Service: Orthopedics;  Laterality: Left;   TOTAL HIP ARTHROPLASTY Right 2006 (approx)   right hip replaced    Review of Systems  All other systems reviewed and are negative.   Current Outpatient Medications  Medication Instructions   Accu-Chek FastClix Lancets MISC Use Accu Chek Fastclix lancets to check blood sugar three times daily. DX:E11.65   ACCU-CHEK GUIDE test strip TEST BLOOD SUGAR THREE TIMES DAILY AS DIRECTED   acetaminophen  (TYLENOL ) 1,000 mg, As needed   APPLE CIDER VINEGAR PO 1 tablet, Daily   ascorbic acid  (VITAMIN C ) 1,000 mg, Oral, Daily   doxycycline  (VIBRA -TABS) 100 mg, Oral, 2 times daily   empagliflozin  (JARDIANCE ) 25 mg, Oral, Daily   fluticasone  (FLONASE ) 50 MCG/ACT nasal spray USE 2 SPRAY(S) IN EACH NOSTRIL AT BEDTIME AS NEEDED FOR  SINUS  DRAINAGE   gabapentin  (NEURONTIN ) 300 MG capsule TAKE 1 CAPSULE FOUR TIMES DAILY AS NEEDED   HYDROcodone -acetaminophen  (NORCO/VICODIN) 5-325 MG tablet 1 tablet, Oral, Every 6 hours PRN   insulin  aspart (NOVOLOG ) 100 UNIT/ML injection Take 5 units with breakfast and 10 units with supper, okay to increase up to 15 units with supper for hyperglycemia.   Insulin  Syringes, Disposable, U-100 0.5 ML MISC Use to inject insulin    metFORMIN  (GLUCOPHAGE -XR) 1,500 mg, Oral, Daily   Multiple Vitamin (MULTIVITAMIN WITH MINERALS) TABS tablet 1 tablet, Every morning   olmesartan  (BENICAR ) 20 mg, Oral, Daily   pioglitazone  (ACTOS ) 30 mg, Oral, Daily   prednisoLONE  acetate (PRED FORTE ) 1 % ophthalmic suspension 1 drop, Every evening   rosuvastatin  (CRESTOR ) 20 mg, Oral, Daily   warfarin (COUMADIN ) 10 mg, Oral, Daily-1600   zinc  sulfate (50mg   elemental zinc ) 220 mg, Oral, Daily    Social History   Tobacco Use   Smoking status: Never   Smokeless tobacco: Never  Vaping Use   Vaping status: Never Used  Substance Use Topics   Alcohol use: Not Currently    Comment: beer and mixed drink maybe 5  times a month   Drug use: No      Objective  Today's Vitals   03/02/24 1026 03/02/24 1131  BP: (!) 173/92 (!) 151/69  Pulse: 60 (!) 55  SpO2: 93%   Height: 6' (1.829 m)   Body mass index is 26.85 kg/m.   Physical Exam: Constitutional: well-appearing, well-nourished; no acute distress; patient sitting in wheelchair. HENT: normocephalic atraumatic, mucous membranes moist Eyes: conjunctiva non-erythematous Cardiovascular: regular rate and rhythm, no m/r/g Pulmonary/Chest: normal work of breathing on room air, lungs clear to auscultation bilaterally Abdominal: soft, non-tender, non-distended MSK: normal bulk and tone Neurological: alert & oriented x 3, no focal deficit Skin: warm and dry Extremities: s/p bilateral BKA with prosthetic right leg. No erythema, edema, or tenderness to palpation of stumps bilaterally. 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 Barg is a 63 year old male with a well-healed left transtibial amputation performed in February 2024 due to diabetic complications and peripheral vascular disease.  This has  significantly limited his ability to perform daily tasks and participate in community activities.  He has never worn a prosthesis on the left but is now highly motivated to begin walking again and regain his independence.  He lives in a home with his brother that has been adapted to his needs, including ramp access, a main floor bedroom and bathroom, and higher hard surface flooring throughout.  He manages all personal care independently and requires only minimal assistance with follow-up household chores, transportation, and mobility outside the home.  Mr. Coluccio has been actively participating in strength, balance, gait, and endurance training at home, demonstrating readiness for prosthetic rehabilitation.  Mr. Hillyard is currently classified as a K2 limited community ambulator.  He has the potential to ambulate independently and navigate low level environmental barriers such as curbs, ramps, uneven terrain, and 1-2 stairs.  With appropriate rehabilitation he is expected to achieve full K2-level function within 3 months of prosthetic fitting.  He demonstrates sufficient cognitive and musculoskeletal capacity to safely use a prosthesis.  He requires a transtibial prosthesis designed for both in-home and limited community use, offering stability and adaptability to meet his current and projected needs.  The prosthesis will include a total contact socket with a flexible inner socket, pin-lock suspension system, and a flexible keel foot with a multi-axial ankle.  This configuration will promote comfort, reduce pressure on sensitive areas, conserve energy, and enhance safety on varied terrain.  Left transtibial prosthesis is medically necessary for Shayne Elahi to restore functional mobility, prevent deconditioning, improve residual limb health, and realized the physical and psychological benefits of upright posture and ambulation.  Today patient is not having any subjective complaints other than  requesting assistance obtaining prosthesis.  On my exam patient's LLE is without without erythema, no tenderness to palpation, wound is well-healed.  Patient follows with Dr. Harden who performed his amputations.   Type 2 diabetes with complication [facility]) Assessment & Plan: Most recent A1c is 6.6% on 01/08/2024.  Patient follows with [facility] endocrinology.  Continue current recommendations per endocrinology.   Healthcare maintenance Assessment & Plan: Patient is due for colorectal screening, but has transportation issues.  He states that he is not opposed to colonoscopy but would not have reliable transportation. -Ordered FIT testing; he will bring his sample to next visit  Orders: -     Fecal occult blood, imunochemical; Future  Status post below knee amputation of right lower extremity [facility]) Assessment & Plan: Patient with no complaints of the right lower extremity.  He is wearing a prosthetic leg.   Essential hypertension Assessment & Plan: Patient with 2 elevated readings today in office.  173/92 is the initial reading  with follow-up of 151/69.  Reports home health nurse checking his blood pressure with readings of 140s-180s systolic.  Previously on lisinopril  5 mg, but does not remember why he stopped taking. - Start olmesartan  20 mg daily - Advised patient to check blood pressures at home - 1 month follow-up visit for hypertension - BMP at next visit  Orders: -     Olmesartan  Medoxomil; Take 1 tablet (20 mg total) by mouth daily.  Dispense: 90 tablet; Refill: 3 -     Basic metabolic panel with GFR; Future  Central retinal artery occlusion of right eye Assessment & Plan: Follows with Dr. Robinson.  Patient states that he cannot see out of his right eye which is consistent with his baseline.  He is stating that he is starting to have blurry vision in his left eye.  Patient states that he has problems with transportation due to his need for a new prosthetic leg.  Will follow-up as best  he can with his ophthalmologist. -Directed patient to follow-up with his ophthalmologist -Continue warfarin, most recent INR within goal range.   Pure hypercholesterolemia Assessment & Plan: Patient's last lipid panel was in November 2024.  LDL of 72.  Patient has a history of stroke due to PFO which has now been repaired.  Currently taking Crestor  20 mg daily.  Secondary prevention goal is LDL less than 70, however patient stroke was due to PFO.  He may still benefit from further lowering of LDL.  Will address at next visit as patient is coming in for prosthetic leg paperwork. -Lipid panel at next visit -Continue taking Crestor  20 mg daily     Return in about 1 month (around 04/02/2024) for follow up blood pressure.   Patient discussed with Dr. Karna, who also saw and evaluated the patient.  Jessiah Steinhart, MD [facility]  PGY-1 03/02/2024, 11:48 AM

## 2024-03-02 ENCOUNTER — Ambulatory Visit

## 2024-03-02 VITALS — BP 151/69 | HR 55 | Ht 72.0 in

## 2024-03-02 DIAGNOSIS — I10 Essential (primary) hypertension: Secondary | ICD-10-CM | POA: Diagnosis not present

## 2024-03-02 DIAGNOSIS — Z89511 Acquired absence of right leg below knee: Secondary | ICD-10-CM

## 2024-03-02 DIAGNOSIS — Z794 Long term (current) use of insulin: Secondary | ICD-10-CM | POA: Diagnosis not present

## 2024-03-02 DIAGNOSIS — H3411 Central retinal artery occlusion, right eye: Secondary | ICD-10-CM | POA: Diagnosis not present

## 2024-03-02 DIAGNOSIS — E118 Type 2 diabetes mellitus with unspecified complications: Secondary | ICD-10-CM | POA: Diagnosis not present

## 2024-03-02 DIAGNOSIS — E7800 Pure hypercholesterolemia, unspecified: Secondary | ICD-10-CM

## 2024-03-02 DIAGNOSIS — Z89512 Acquired absence of left leg below knee: Secondary | ICD-10-CM

## 2024-03-02 DIAGNOSIS — Z0000 Encounter for general adult medical examination without abnormal findings: Secondary | ICD-10-CM

## 2024-03-02 MED ORDER — OLMESARTAN MEDOXOMIL 20 MG PO TABS: 20.0000 mg | ORAL_TABLET | Freq: Every day | ORAL | 3 refills | Status: DC

## 2024-03-02 NOTE — Assessment & Plan Note (Addendum)
 Follows with Dr. Robinson.  Patient states that he cannot see out of his right eye which is consistent with his baseline.  He is stating that he is starting to have blurry vision in his left eye.  Patient states that he has problems with transportation due to his need for a new prosthetic leg.  Will follow-up as best he can with his ophthalmologist. -Directed patient to follow-up with his ophthalmologist -Continue warfarin, most recent INR within goal range.

## 2024-03-02 NOTE — Assessment & Plan Note (Signed)
 Patient is due for colorectal screening, but has transportation issues.  He states that he is not opposed to colonoscopy but would not have reliable transportation. -Ordered FIT testing; he will bring his sample to next visit

## 2024-03-02 NOTE — Assessment & Plan Note (Addendum)
 Patient's last lipid panel was in November 2024.  LDL of 72.  Patient has a history of stroke due to PFO which has now been repaired.  Currently taking Crestor  20 mg daily.  Secondary prevention goal is LDL less than 70, however patient stroke was due to PFO.  He may still benefit from further lowering of LDL.  Will address at next visit as patient is coming in for prosthetic leg paperwork. -Lipid panel at next visit -Continue taking Crestor  20 mg daily

## 2024-03-02 NOTE — Assessment & Plan Note (Addendum)
 Mr. Gregory May is a 63 year old male with a well-healed left transtibial amputation performed in February 2024 due to diabetic complications and peripheral vascular disease.  This has significantly limited his ability to perform daily tasks and participate in community activities.  He has never worn a prosthesis on the left but is now highly motivated to begin walking again and regain his independence.  He lives in a home with his brother that has been adapted to his needs, including ramp access, a main floor bedroom and bathroom, and higher hard surface flooring throughout.  He manages all personal care independently and requires only minimal assistance with follow-up household chores, transportation, and mobility outside the home.  Mr. Gregory May has been actively participating in strength, balance, gait, and endurance training at home, demonstrating readiness for prosthetic rehabilitation.  Mr. Gregory May is currently classified as a K2 limited community ambulator.  He has the potential to ambulate independently and navigate low level environmental barriers such as curbs, ramps, uneven terrain, and 1-2 stairs.  With appropriate rehabilitation he is expected to achieve full K2-level function within 3 months of prosthetic fitting.  He demonstrates sufficient cognitive and musculoskeletal capacity to safely use a prosthesis.  He requires a transtibial prosthesis designed for both in-home and limited community use, offering stability and adaptability to meet his current and projected needs.  The prosthesis will include a total contact socket with a flexible inner socket, pin-lock suspension system, and a flexible keel foot with a multi-axial ankle.  This configuration will promote comfort, reduce pressure on sensitive areas, conserve energy, and enhance safety on varied terrain.  Left transtibial prosthesis is medically necessary for Gregory May to restore functional mobility, prevent deconditioning,  improve residual limb health, and realized the physical and psychological benefits of upright posture and ambulation.  Today patient is not having any subjective complaints other than requesting assistance obtaining prosthesis.  On my exam patient's LLE is without without erythema, no tenderness to palpation, wound is well-healed.  Patient follows with Dr. Harden who performed his amputations.

## 2024-03-02 NOTE — Assessment & Plan Note (Signed)
 Patient with no complaints of the right lower extremity.  He is wearing a prosthetic leg.

## 2024-03-02 NOTE — Patient Instructions (Addendum)
 Thank you, Mr.Keyontay T Boice for allowing us  to provide your care today. Today we discussed the following:  - We are working on your paperwork for your prosthetic leg. - I have sent the prescription for Olmesartan  (Benicar ) 20mg  to the [facility] on [address]. Please take 1 pill daily. - Please check your blood pressures at home or record readings from home health nurse so we can adjust medication at next visit. - For the fecal occult blood test (FIT) - please complete the sample either 1 day before or the same day as your next appointment with our clinic.  I have ordered the following labs for you:  Lab Orders         Fecal occult blood, imunochemical         Basic metabolic panel with GFR      I have ordered the following medication/changed the following medications:   Stop the following medications: There are no discontinued medications.   Start the following medications: Meds ordered this encounter  Medications   olmesartan  (BENICAR ) 20 MG tablet    Sig: Take 1 tablet (20 mg total) by mouth daily.    Dispense:  90 tablet    Refill:  3     Follow up: 1 month for BP recheck    Remember: Please check your blood pressure at home  Should you have any questions or concerns please call the Internal Medicine Clinic at [phone].     Cecilia Nishikawa, MD [facility]

## 2024-03-02 NOTE — Assessment & Plan Note (Signed)
 Patient with 2 elevated readings today in office.  173/92 is the initial reading with follow-up of 151/69.  Reports home health nurse checking his blood pressure with readings of 140s-180s systolic.  Previously on lisinopril  5 mg, but does not remember why he stopped taking. - Start olmesartan  20 mg daily - Advised patient to check blood pressures at home - 1 month follow-up visit for hypertension - BMP at next visit

## 2024-03-02 NOTE — Assessment & Plan Note (Addendum)
 Most recent A1c is 6.6% on 01/08/2024.  Patient follows with [facility] endocrinology.  Continue current recommendations per endocrinology.

## 2024-03-03 DIAGNOSIS — Z89511 Acquired absence of right leg below knee: Secondary | ICD-10-CM | POA: Diagnosis not present

## 2024-03-03 DIAGNOSIS — Z7901 Long term (current) use of anticoagulants: Secondary | ICD-10-CM | POA: Diagnosis not present

## 2024-03-03 DIAGNOSIS — E7800 Pure hypercholesterolemia, unspecified: Secondary | ICD-10-CM | POA: Diagnosis not present

## 2024-03-03 DIAGNOSIS — E1142 Type 2 diabetes mellitus with diabetic polyneuropathy: Secondary | ICD-10-CM | POA: Diagnosis not present

## 2024-03-03 DIAGNOSIS — Z9181 History of falling: Secondary | ICD-10-CM | POA: Diagnosis not present

## 2024-03-03 DIAGNOSIS — Z96643 Presence of artificial hip joint, bilateral: Secondary | ICD-10-CM | POA: Diagnosis not present

## 2024-03-03 DIAGNOSIS — H3411 Central retinal artery occlusion, right eye: Secondary | ICD-10-CM | POA: Diagnosis not present

## 2024-03-03 DIAGNOSIS — G4733 Obstructive sleep apnea (adult) (pediatric): Secondary | ICD-10-CM | POA: Diagnosis not present

## 2024-03-03 DIAGNOSIS — Z8673 Personal history of transient ischemic attack (TIA), and cerebral infarction without residual deficits: Secondary | ICD-10-CM | POA: Diagnosis not present

## 2024-03-03 DIAGNOSIS — K760 Fatty (change of) liver, not elsewhere classified: Secondary | ICD-10-CM | POA: Diagnosis not present

## 2024-03-03 DIAGNOSIS — Z89512 Acquired absence of left leg below knee: Secondary | ICD-10-CM | POA: Diagnosis not present

## 2024-03-03 DIAGNOSIS — Z7984 Long term (current) use of oral hypoglycemic drugs: Secondary | ICD-10-CM | POA: Diagnosis not present

## 2024-03-03 DIAGNOSIS — Z86718 Personal history of other venous thrombosis and embolism: Secondary | ICD-10-CM | POA: Diagnosis not present

## 2024-03-03 DIAGNOSIS — Z794 Long term (current) use of insulin: Secondary | ICD-10-CM | POA: Diagnosis not present

## 2024-03-06 NOTE — Progress Notes (Signed)
 Internal Medicine Clinic Attending  I was physically present during the key portions of the resident provided service and participated in the medical decision making of patient's management care. I reviewed pertinent patient test results.  The assessment, diagnosis, and plan were formulated together and I agree with the documentation in the resident's note.  Dickie La, MD

## 2024-03-10 ENCOUNTER — Telehealth: Payer: Self-pay | Admitting: *Deleted

## 2024-03-10 DIAGNOSIS — Z7984 Long term (current) use of oral hypoglycemic drugs: Secondary | ICD-10-CM | POA: Diagnosis not present

## 2024-03-10 DIAGNOSIS — Z86718 Personal history of other venous thrombosis and embolism: Secondary | ICD-10-CM | POA: Diagnosis not present

## 2024-03-10 DIAGNOSIS — E1142 Type 2 diabetes mellitus with diabetic polyneuropathy: Secondary | ICD-10-CM | POA: Diagnosis not present

## 2024-03-10 DIAGNOSIS — Z9181 History of falling: Secondary | ICD-10-CM | POA: Diagnosis not present

## 2024-03-10 DIAGNOSIS — G4733 Obstructive sleep apnea (adult) (pediatric): Secondary | ICD-10-CM | POA: Diagnosis not present

## 2024-03-10 DIAGNOSIS — Z96643 Presence of artificial hip joint, bilateral: Secondary | ICD-10-CM | POA: Diagnosis not present

## 2024-03-10 DIAGNOSIS — Z8673 Personal history of transient ischemic attack (TIA), and cerebral infarction without residual deficits: Secondary | ICD-10-CM | POA: Diagnosis not present

## 2024-03-10 DIAGNOSIS — H3411 Central retinal artery occlusion, right eye: Secondary | ICD-10-CM | POA: Diagnosis not present

## 2024-03-10 DIAGNOSIS — E7800 Pure hypercholesterolemia, unspecified: Secondary | ICD-10-CM | POA: Diagnosis not present

## 2024-03-10 DIAGNOSIS — Z794 Long term (current) use of insulin: Secondary | ICD-10-CM | POA: Diagnosis not present

## 2024-03-10 DIAGNOSIS — K760 Fatty (change of) liver, not elsewhere classified: Secondary | ICD-10-CM | POA: Diagnosis not present

## 2024-03-10 DIAGNOSIS — Z7901 Long term (current) use of anticoagulants: Secondary | ICD-10-CM | POA: Diagnosis not present

## 2024-03-10 DIAGNOSIS — Z89511 Acquired absence of right leg below knee: Secondary | ICD-10-CM | POA: Diagnosis not present

## 2024-03-10 DIAGNOSIS — Z89512 Acquired absence of left leg below knee: Secondary | ICD-10-CM | POA: Diagnosis not present

## 2024-03-10 NOTE — Telephone Encounter (Unsigned)
 Copied from CRM [id number]. Topic: Clinical - Order For Equipment >> Mar 10, 2024  1:42 PM Diannia H wrote: Reason for CRM: Patient is calling about the status about his prosthetic leg and the orders, he stated that a resident could not sign off on it and it needed to be a medical doctor. He is trying to get his leg made and needs some additional questions answered. Could you assist? Patients callback number is [phone].

## 2024-03-10 NOTE — Telephone Encounter (Signed)
 Patient appt has been scheduled. PT to call back if he is unable to keep this appt.  Name: Gregory May, Gregory May MRN: [id number]  Date: 04/02/2024 Status: Sch  Time: 1:15 PM Length: 30  Visit Type: OPEN ESTABLISHED [726] Copay: $0.00  Provider: Benuel Braun, DO Department: [facility]  Referring Provider: MYRNA BITTERS       Copied from CRM [id number]. Topic: Appointments - Appointment Scheduling >> Mar 06, 2024 10:59 AM Marda MATSU wrote: Patient ask if he could receive a call back when Dr MYRNA or Dr Waymond has availability. His appt due date to return is around 04/02/2024  for a follow up blood pressure visit.  Please advise.

## 2024-03-11 ENCOUNTER — Ambulatory Visit

## 2024-03-11 VITALS — Ht 72.0 in | Wt 190.0 lb

## 2024-03-11 DIAGNOSIS — Z0000 Encounter for general adult medical examination without abnormal findings: Secondary | ICD-10-CM | POA: Diagnosis not present

## 2024-03-11 NOTE — Progress Notes (Signed)
 Because this visit was a virtual/telehealth visit,  certain criteria was not obtained, such a blood pressure, CBG if applicable, and timed get up and go. Any medications not marked as taking were not mentioned during the medication reconciliation part of the visit. Any vitals not documented were not able to be obtained due to this being a telehealth visit or patient was unable to self-report a recent blood pressure reading due to a lack of equipment at home via telehealth. Vitals that have been documented are verbally provided by the patient.   Subjective:   Gregory May is a 63 y.o. who presents for a Medicare Wellness preventive visit.  As a reminder, Annual Wellness Visits don't include a physical exam, and some assessments may be limited, especially if this visit is performed virtually. We may recommend an in-person follow-up visit with your provider if needed.  Visit Complete: Virtual I connected with  Tamaj T Cange on 03/11/24 by a audio enabled telemedicine application and verified that I am speaking with the correct person using two identifiers.  Patient Location: Home  Provider Location: Home Office  I discussed the limitations of evaluation and management by telemedicine. The patient expressed understanding and agreed to proceed.  Vital Signs: Because this visit was a virtual/telehealth visit, some criteria may be missing or patient reported. Any vitals not documented were not able to be obtained and vitals that have been documented are patient reported.  VideoDeclined- This patient declined Librarian, academic. Therefore the visit was completed with audio only.  Persons Participating in Visit: Patient.  AWV Questionnaire: No: Patient Medicare AWV questionnaire was not completed prior to this visit.  Cardiac Risk Factors include: advanced age (>77men, >56 women);sedentary lifestyle;diabetes mellitus;dyslipidemia;family history of  premature cardiovascular disease;hypertension;male gender     Objective:    Today's Vitals   03/11/24 1424  Weight: 190 lb (86.2 kg)  Height: 6' (1.829 m)  PainSc: 0-No pain   Body mass index is 25.77 kg/m.     03/11/2024    2:29 PM 01/16/2023    4:04 PM 11/26/2022    2:58 PM 09/28/2022    9:43 PM 03/29/2022   10:49 AM 02/28/2022    7:46 AM 01/24/2022    1:09 PM  Advanced Directives  Does Patient Have a Medical Advance Directive? No No No No No No No  Would patient like information on creating a medical advance directive? No - Patient declined Yes (MAU/Ambulatory/Procedural Areas - Information given) No - Patient declined  No - Patient declined No - Patient declined     Current Medications (verified) Outpatient Encounter Medications as of 03/11/2024  Medication Sig   Accu-Chek FastClix Lancets MISC Use Accu Chek Fastclix lancets to check blood sugar three times daily. DX:E11.65   ACCU-CHEK GUIDE test strip TEST BLOOD SUGAR THREE TIMES DAILY AS DIRECTED   acetaminophen  (TYLENOL ) 500 MG tablet Take 1,000 mg by mouth as needed for mild pain or moderate pain.   APPLE CIDER VINEGAR PO Take 1 tablet by mouth daily.   ascorbic acid  (VITAMIN C ) 1000 MG tablet Take 1 tablet (1,000 mg total) by mouth daily.   doxycycline  (VIBRA -TABS) 100 MG tablet Take 1 tablet (100 mg total) by mouth 2 (two) times daily. (Patient not taking: Reported on 01/20/2024)   empagliflozin  (JARDIANCE ) 25 MG TABS tablet Take 1 tablet (25 mg total) by mouth daily.   fluticasone  (FLONASE ) 50 MCG/ACT nasal spray USE 2 SPRAY(S) IN EACH NOSTRIL AT BEDTIME AS NEEDED FOR  SINUS  DRAINAGE   gabapentin  (NEURONTIN ) 300 MG capsule TAKE 1 CAPSULE FOUR TIMES DAILY AS NEEDED   HYDROcodone -acetaminophen  (NORCO/VICODIN) 5-325 MG tablet Take 1 tablet by mouth every 6 (six) hours as needed for moderate pain.   insulin  aspart (NOVOLOG ) 100 UNIT/ML injection Take 5 units with breakfast and 10 units with supper, okay to increase up to 15  units with supper for hyperglycemia.   Insulin  Syringes, Disposable, U-100 0.5 ML MISC Use to inject insulin    metFORMIN  (GLUCOPHAGE -XR) 750 MG 24 hr tablet Take 2 tablets (1,500 mg total) by mouth daily.   Multiple Vitamin (MULTIVITAMIN WITH MINERALS) TABS tablet Take 1 tablet by mouth every morning. Centrum   olmesartan  (BENICAR ) 20 MG tablet Take 1 tablet (20 mg total) by mouth daily.   pioglitazone  (ACTOS ) 30 MG tablet Take 1 tablet (30 mg total) by mouth daily.   prednisoLONE  acetate (PRED FORTE ) 1 % ophthalmic suspension Place 1 drop into the right eye every evening.   rosuvastatin  (CRESTOR ) 20 MG tablet Take 1 tablet (20 mg total) by mouth daily.   warfarin (COUMADIN ) 5 MG tablet Take 2 tablets (10 mg total) by mouth daily at 4 PM.   zinc  sulfate 220 (50 Zn) MG capsule Take 1 capsule (220 mg total) by mouth daily.   No facility-administered encounter medications on file as of 03/11/2024.    Allergies (verified) Patient has no known allergies.   History: Past Medical History:  Diagnosis Date   Arthritis    bilateral hips   Cutaneous abscess of left foot    Deep vein thrombosis (DVT) (HCC)    Diabetes mellitus    type II   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 Date of diagnosis February 08 2014 Duration of anticoagulation: 6 months. End date 08/12/2014.  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 Duration of anticoagulation: 8 months. End date 10/11/2014.  Anticoagulant: Lovenox  120 units daily Switched to Eliquis  on  05/25/2014 per patient preference    Pulmonary embolism (HCC)    Sebaceous cyst    on back of neck   Past Surgical History:  Procedure Laterality Date   AMPUTATION Right 06/03/2015   Procedure: Right Below Knee Amputation;  Surgeon: Jerona Harden GAILS, MD;  Location: [facility] OR;  Service: Orthopedics;  Laterality: Right;   AMPUTATION Left 07/24/2019   Procedure: LEFT FOOT 3RD RAY AMPUTATION;  Surgeon: Harden Jerona GAILS, MD;  Location: [facility] OR;  Service: Orthopedics;  Laterality: Left;   AMPUTATION Left 06/23/2021   Procedure: LEFT 2ND TOE AMPUTATION;  Surgeon: Harden Jerona GAILS, MD;  Location: [facility] OR;  Service: Orthopedics;  Laterality: Left;   AMPUTATION Right 08/21/2021   Procedure: AMPUTATION RIGHT INDEX FINGER;  Surgeon: Alyse Agent, MD;  Location: [facility] OR;  Service: Orthopedics;  Laterality: Right;   AMPUTATION Left 09/29/2022   Procedure: AMPUTATION BELOW KNEE;  Surgeon: Harden Jerona GAILS, MD;  Location: [facility] OR;  Service: Orthopedics;  Laterality: Left;   BUBBLE STUDY  01/24/2022  Procedure: BUBBLE STUDY;  Surgeon: Delford Maude BROCKS, MD;  Location: [facility] ENDOSCOPY;  Service: Cardiovascular;;   CLOSED REDUCTION WITH HUMER PIN INSERTION  1974   left hip   HARDWARE REMOVAL Left 07/21/2014   Procedure: HARDWARE REMOVAL;  Surgeon: Toribio JULIANNA Chancy, MD;  Location: [facility] OR;  Service: Orthopedics;  Laterality: Left;   I & D EXTREMITY Right 08/21/2021   Procedure: IRRIGATION AND DEBRIDEMENT RIGHT INDEX FINGER;  Surgeon: Alyse Agent, MD;  Location: [facility] OR;  Service: Orthopedics;  Laterality: Right;   PATENT FORAMEN OVALE(PFO) CLOSURE N/A 02/28/2022   Procedure: PATENT FORAMEN OVALE(PFO) CLOSURE;  Surgeon: Wonda Sharper, MD;  Location: [facility] INVASIVE CV LAB;  Service: Cardiovascular;  Laterality: N/A;   ROTATOR CUFF REPAIR Right 2005 (approx)   TEE WITHOUT CARDIOVERSION N/A 01/24/2022   Procedure: TRANSESOPHAGEAL ECHOCARDIOGRAM (TEE);  Surgeon: Delford Maude BROCKS, MD;  Location: [facility] ENDOSCOPY;  Service: Cardiovascular;  Laterality:  N/A;   TOTAL HIP ARTHROPLASTY Left 07/21/2014   Procedure: TOTAL HIP ARTHROPLASTY ANTERIOR APPROACH;  Surgeon: Toribio JULIANNA Chancy, MD;  Location: [facility] OR;  Service: Orthopedics;  Laterality: Left;   TOTAL HIP ARTHROPLASTY Right 2006 (approx)   right hip replaced   Family History  Problem Relation Age of Onset   Breast cancer Mother    Cancer Mother        small intestine   Liver cancer Mother    Diabetes Mother    Diabetes Father    Diabetes Brother    Hypertension Maternal Grandmother    Heart Problems Maternal Grandmother    Diabetes Paternal Grandmother    Diabetes Paternal Grandfather    Diabetes Brother    Social History   Socioeconomic History   Marital status: Single    Spouse name: Not on file   Number of children: 0   Years of education: Not on file   Highest education level: Not on file  Occupational History   Occupation: disabled  Tobacco Use   Smoking status: Never   Smokeless tobacco: Never  Vaping Use   Vaping status: Never Used  Substance and Sexual Activity   Alcohol use: Not Currently    Comment: beer and mixed drink maybe 5  times a month   Drug use: No   Sexual activity: Never  Other Topics Concern   Not on file  Social History Narrative   Not on file   Social Drivers of Health   Financial Resource Strain: Low Risk  (03/11/2024)   Overall Financial Resource Strain (CARDIA)    Difficulty of Paying Living Expenses: Not hard at all  Food Insecurity: No Food Insecurity (03/11/2024)   Hunger Vital Sign    Worried About Running Out of Food in the Last Year: Never true    Ran Out of Food in the Last Year: Never true  Transportation Needs: No Transportation Needs (03/11/2024)   PRAPARE - Administrator, Civil Service (Medical): No    Lack of Transportation (Non-Medical): No  Physical Activity: Inactive (03/11/2024)   Exercise Vital Sign    Days of Exercise per Week: 0 days    Minutes of Exercise per Session: 0 min  Stress: No Stress Concern  Present (03/11/2024)   [employer] of Occupational Health - Occupational Stress Questionnaire    Feeling of Stress: Only a little  Social Connections: Socially Isolated (03/11/2024)   Social Connection and Isolation Panel    Frequency of Communication with Friends and Family: More than three times a week  Frequency of Social Gatherings with Friends and Family: Twice a week    Attends Religious Services: Never    Database administrator or Organizations: No    Attends Engineer, structural: Never    Marital Status: Never married    Tobacco Counseling Counseling given: Not Answered    Clinical Intake:  Pre-visit preparation completed: Yes  Pain : No/denies pain Pain Score: 0-No pain     BMI - recorded: 25.77 Nutritional Status: BMI 25 -29 Overweight Nutritional Risks: None Diabetes: Yes CBG done?: Yes (105 fasting) Did pt. bring in CBG monitor from home?: No  Lab Results  Component Value Date   HGBA1C 6.6 (A) 01/08/2024   HGBA1C 6.8 (H) 10/01/2023   HGBA1C 6.4 07/02/2023     How often do you need to have someone help you when you read instructions, pamphlets, or other written materials from your doctor or pharmacy?: 1 - Never  Interpreter Needed?: No  Information entered by :: Addalyn Speedy N. Merry Pond, LPN.   Activities of Daily Living     03/11/2024    2:33 PM  In your present state of health, do you have any difficulty performing the following activities:  Hearing? 0  Vision? 0  Difficulty concentrating or making decisions? 0  Comment BSE: READING NEWSPAPER, PUZZLES, [employer]  Walking or climbing stairs? 1  Dressing or bathing? 0  Doing errands, shopping? 0  Preparing Food and eating ? N  Using the Toilet? N  In the past six months, have you accidently leaked urine? N  Do you have problems with loss of bowel control? N  Managing your Medications? N  Managing your Finances? N  Housekeeping or managing your Housekeeping? N     Patient Care Team: Myrna Bitters, DO as PCP - General Weyman Corning, RN (Inactive) as [facility] Care Management Nonah Camellia ORN, MD as Consulting Physician (Optometry) Harden Jerona GAILS, MD as Consulting Physician (Orthopedic Surgery) Robinson Mayo, OD as Referring Physician (Optometry) Corbin Mabel NOVAK, MD as Consulting Physician (Ophthalmology)  I have updated your Care Teams any recent Medical Services you may have received from other providers in the past year.     Assessment:   This is a routine wellness examination for Gregory May.  Hearing/Vision screen Hearing Screening - Comments:: Denies hearing difficulties.  Vision Screening - Comments:: Wears rx glasses - up to date with routine eye exams with Dr. Mayo Robinson, OD.   Goals Addressed             This Visit's Progress    03/11/2024: To get my prosthetic leg.       HEMOGLOBIN A1C < 7.0         Depression Screen     03/11/2024    2:35 PM 01/16/2023    4:05 PM 11/26/2022    2:58 PM 03/29/2022   10:43 AM 01/04/2022    4:13 PM 12/15/2021    2:40 PM 11/23/2021    2:47 PM  PHQ 2/9 Scores  PHQ - 2 Score 0 0 0 0 0 0 1  PHQ- 9 Score 0     1 2    Fall Risk     03/11/2024    2:32 PM 01/16/2023    4:03 PM 11/26/2022    2:58 PM 04/23/2022    4:15 PM 03/29/2022   10:46 AM  Fall Risk   Falls in the past year? 0 1 0 1 1  Number falls in past yr: 0 0 0  1 0  Injury with Fall? 0 0 0 0 0  Risk for fall due to : No Fall Risks Impaired balance/gait;Impaired mobility No Fall Risks Impaired balance/gait History of fall(s)  Follow up Falls evaluation completed Education provided;Falls prevention discussed;Falls evaluation completed Falls evaluation completed;Falls prevention discussed Falls evaluation completed;Education provided  Falls prevention discussed      Data saved with a previous flowsheet row definition    MEDICARE RISK AT HOME:  Medicare Risk at Home Any stairs in or around the home?: No If so, are there any  without handrails?: No Home free of loose throw rugs in walkways, pet beds, electrical cords, etc?: Yes Adequate lighting in your home to reduce risk of falls?: Yes Life alert?: No Use of a cane, walker or w/c?: Yes Grab bars in the bathroom?: Yes (TUB) Shower chair or bench in shower?: Yes Elevated toilet seat or a handicapped toilet?: Yes  TIMED UP AND GO:  Was the test performed?  No  Cognitive Function: Declined/Normal: No cognitive concerns noted by patient or family. Patient alert, oriented, able to answer questions appropriately and recall recent events. No signs of memory loss or confusion.    03/11/2024    2:33 PM  MMSE - Mini Mental State Exam  Not completed: Unable to complete        03/11/2024    2:33 PM 01/16/2023    4:04 PM 12/15/2021    2:48 PM  6CIT Screen  What Year? 0 points 0 points 0 points  What month? 0 points 0 points 0 points  What time? 0 points 0 points 0 points  Count back from 20 0 points 0 points 0 points  Months in reverse 0 points 0 points 0 points  Repeat phrase 0 points 0 points 2 points  Total Score 0 points 0 points 2 points    Immunizations Immunization History  Administered Date(s) Administered   Influenza,inj,Quad PF,6+ Mos 07/22/2014, 06/11/2015, 07/20/2016, 06/20/2017, 05/30/2018, 05/15/2019, 06/23/2020, 05/29/2021   Pneumococcal Polysaccharide-23 07/22/2014   Tetanus 11/05/2013    Screening Tests Health Maintenance  Topic Date Due   COLON CANCER SCREENING ANNUAL FOBT  Never done   Colonoscopy  Never done   Zoster Vaccines- Shingrix (1 of 2) Never done   DTaP/Tdap/Td (1 - Tdap) 11/06/2013   Pneumococcal Vaccine 5-47 Years old (2 of 2 - PCV) 07/23/2015   OPHTHALMOLOGY EXAM  04/07/2023   COVID-19 Vaccine (1 - 2024-25 season) Never done   INFLUENZA VACCINE  03/13/2024   HEMOGLOBIN A1C  04/09/2024   LIPID PANEL  07/01/2024   Diabetic kidney evaluation - eGFR measurement  09/30/2024   Diabetic kidney evaluation - Urine ACR   10/08/2024   Medicare Annual Wellness (AWV)  03/11/2025   Hepatitis C Screening  Completed   HIV Screening  Completed   Hepatitis B Vaccines  Aged Out   HPV VACCINES  Aged Out   Meningococcal B Vaccine  Aged Out   FOOT EXAM  Discontinued    Health Maintenance  Health Maintenance Due  Topic Date Due   COLON CANCER SCREENING ANNUAL FOBT  Never done   Colonoscopy  Never done   Zoster Vaccines- Shingrix (1 of 2) Never done   DTaP/Tdap/Td (1 - Tdap) 11/06/2013   Pneumococcal Vaccine 66-36 Years old (2 of 2 - PCV) 07/23/2015   OPHTHALMOLOGY EXAM  04/07/2023   COVID-19 Vaccine (1 - 2024-25 season) Never done   Health Maintenance Items Addressed: Yes Patient aware of current care gaps.  Patient  is due for the following: Colonoscopy,. Eye Exam and vaccines.  Additional Screening:  Vision Screening: Recommended annual ophthalmology exams for early detection of glaucoma and other disorders of the eye. Would you like a referral to an eye doctor? No    Dental Screening: Recommended annual dental exams for proper oral hygiene  Community Resource Referral / Chronic Care Management: CRR required this visit?  No   CCM required this visit?  No   Plan:    I have personally reviewed and noted the following in the patient's chart:   Medical and social history Use of alcohol, tobacco or illicit drugs  Current medications and supplements including opioid prescriptions. Patient is currently taking opioid prescriptions. Information provided to patient regarding non-opioid alternatives. Patient advised to discuss non-opioid treatment plan with their provider. Functional ability and status Nutritional status Physical activity Advanced directives List of other physicians Hospitalizations, surgeries, and ER visits in previous 12 months Vitals Screenings to include cognitive, depression, and falls Referrals and appointments  In addition, I have reviewed and discussed with patient certain  preventive protocols, quality metrics, and best practice recommendations. A written personalized care plan for preventive services as well as general preventive health recommendations were provided to patient.   Roz LOISE Fuller, LPN   [id number]   After Visit Summary: (MyChart) Due to this being a telephonic visit, the after visit summary with patients personalized plan was offered to patient via MyChart   Notes: Patient aware of current care gaps.  Patient is due for the following: Colonoscopy,. Eye Exam and vaccines.

## 2024-03-11 NOTE — Telephone Encounter (Signed)
 Pt's calling about his BP med and prosthetic. His only concern about BP med is he needs an appt to f/u; appt has been scheduled with Dr Lorrin 8/21 (he wants to see Dr Waymond who currently has no appt). I informed him about the prosthetic, we are waiting on the form/additional information needed - stated he will talk to [place].

## 2024-03-11 NOTE — Patient Instructions (Signed)
 Gregory May , Thank you for taking time to come for your Medicare Wellness Visit. I appreciate your ongoing commitment to your health goals. Please review the following plan we discussed and let me know if I can assist you in the future.   Referrals/Orders/Follow-Ups/Clinician Recommendations: None  This is a list of the screening recommended for you and due dates:  Health Maintenance  Topic Date Due   Stool Blood Test  Never done   Colon Cancer Screening  Never done   Zoster (Shingles) Vaccine (1 of 2) Never done   DTaP/Tdap/Td vaccine (1 - Tdap) 11/06/2013   Pneumococcal Vaccination (2 of 2 - PCV) 07/23/2015   Eye exam for diabetics  04/07/2023   COVID-19 Vaccine (1 - 2024-25 season) Never done   Flu Shot  03/13/2024   Hemoglobin A1C  04/09/2024   Lipid (cholesterol) test  07/01/2024   Yearly kidney function blood test for diabetes  09/30/2024   Yearly kidney health urinalysis for diabetes  10/08/2024   Medicare Annual Wellness Visit  03/11/2025   Hepatitis C Screening  Completed   HIV Screening  Completed   Hepatitis B Vaccine  Aged Out   HPV Vaccine  Aged Out   Meningitis B Vaccine  Aged Out   Complete foot exam   Discontinued    Advanced directives: (Declined) Advance directive discussed with you today. Even though you declined this today, please call our office should you change your mind, and we can give you the proper paperwork for you to fill out.  Next Medicare Annual Wellness Visit scheduled for next year: Yes

## 2024-03-16 ENCOUNTER — Ambulatory Visit: Admitting: Pharmacist

## 2024-03-16 DIAGNOSIS — H349 Unspecified retinal vascular occlusion: Secondary | ICD-10-CM

## 2024-03-16 DIAGNOSIS — Z7901 Long term (current) use of anticoagulants: Secondary | ICD-10-CM | POA: Diagnosis not present

## 2024-03-16 LAB — POCT INR: INR: 3 (ref 2.0–3.0)

## 2024-03-16 NOTE — Progress Notes (Signed)
 Anticoagulation Management Gregory May is a 63 y.o. male who reports to the clinic for monitoring of warfarin treatment.    Indication: Retinal artery occlusion, central Right retinal embolus Long term (current) use of anticoagulants. Duration: indefinite Supervising physician: Mliss Foot, MD  Anticoagulation Clinic Visit History: Patient does not report signs/symptoms of bleeding or thromboembolism  Other recent changes: No diet, medications, lifestyle changes cited by the patient at this visit.  Anticoagulation Episode Summary     Current INR goal:  2.0-3.0  TTR:  76.3% (1.9 y)  Next INR check:  04/06/2024  INR from last check:  3.0 (03/16/2024)  Weekly max warfarin dose:  --  Target end date:  --  INR check location:  --  Preferred lab:  --  Send INR reminders to:  --   Indications   Pulmonary emboli (HCC) (Resolved) [I26.99] DVT (deep venous thrombosis) (HCC) (Resolved) [I82.409] Retinal artery occlusion central [H34.10] Right retinal embolus [H34.9] Long term (current) use of anticoagulants [Z79.01]        Comments:  --         No Known Allergies  Current Outpatient Medications:    Accu-Chek FastClix Lancets MISC, Use Accu Chek Fastclix lancets to check blood sugar three times daily. DX:E11.65, Disp: 300 each, Rfl: 2   ACCU-CHEK GUIDE test strip, TEST BLOOD SUGAR THREE TIMES DAILY AS DIRECTED, Disp: 300 strip, Rfl: 3   acetaminophen  (TYLENOL ) 500 MG tablet, Take 1,000 mg by mouth as needed for mild pain or moderate pain., Disp: , Rfl:    fluticasone  (FLONASE ) 50 MCG/ACT nasal spray, USE 2 SPRAY(S) IN EACH NOSTRIL AT BEDTIME AS NEEDED FOR  SINUS  DRAINAGE, Disp: 16 g, Rfl: 0   gabapentin  (NEURONTIN ) 300 MG capsule, TAKE 1 CAPSULE FOUR TIMES DAILY AS NEEDED, Disp: 360 capsule, Rfl: 3   insulin  aspart (NOVOLOG ) 100 UNIT/ML injection, Take 5 units with breakfast and 10 units with supper, okay to increase up to 15 units with supper for hyperglycemia., Disp: 10  mL, Rfl: 4   Insulin  Syringes, Disposable, U-100 0.5 ML MISC, Use to inject insulin , Disp: 100 each, Rfl: 2   metFORMIN  (GLUCOPHAGE -XR) 750 MG 24 hr tablet, Take 2 tablets (1,500 mg total) by mouth daily., Disp: 180 tablet, Rfl: 3   Multiple Vitamin (MULTIVITAMIN WITH MINERALS) TABS tablet, Take 1 tablet by mouth every morning. Centrum, Disp: , Rfl:    olmesartan  (BENICAR ) 20 MG tablet, Take 1 tablet (20 mg total) by mouth daily., Disp: 90 tablet, Rfl: 3   pioglitazone  (ACTOS ) 30 MG tablet, Take 1 tablet (30 mg total) by mouth daily., Disp: 90 tablet, Rfl: 3   prednisoLONE  acetate (PRED FORTE ) 1 % ophthalmic suspension, Place 1 drop into the right eye every evening., Disp: , Rfl:    rosuvastatin  (CRESTOR ) 20 MG tablet, Take 1 tablet (20 mg total) by mouth daily., Disp: 90 tablet, Rfl: 3   warfarin (COUMADIN ) 5 MG tablet, Take 2 tablets (10 mg total) by mouth daily at 4 PM., Disp: 56 tablet, Rfl: 3   APPLE CIDER VINEGAR PO, Take 1 tablet by mouth daily. (Patient not taking: Reported on 03/16/2024), Disp: , Rfl:    ascorbic acid  (VITAMIN C ) 1000 MG tablet, Take 1 tablet (1,000 mg total) by mouth daily. (Patient not taking: Reported on 03/16/2024), Disp: , Rfl:    doxycycline  (VIBRA -TABS) 100 MG tablet, Take 1 tablet (100 mg total) by mouth 2 (two) times daily. (Patient not taking: Reported on 03/16/2024), Disp: 28 tablet, Rfl: 0  empagliflozin  (JARDIANCE ) 25 MG TABS tablet, Take 1 tablet (25 mg total) by mouth daily. (Patient not taking: Reported on 03/16/2024), Disp: 90 tablet, Rfl: 3   HYDROcodone -acetaminophen  (NORCO/VICODIN) 5-325 MG tablet, Take 1 tablet by mouth every 6 (six) hours as needed for moderate pain. 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 Date of diagnosis February 08 2014 Duration of anticoagulation: 6 months. End date 08/12/2014.  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 Duration of anticoagulation: 8 months. End date 10/11/2014.  Anticoagulant: Lovenox  120 units daily Switched to Eliquis  on 05/25/2014 per patient preference    Pulmonary embolism (HCC)    Sebaceous cyst    on back of neck   Social History   Socioeconomic History   Marital status: Single    Spouse name: Not on file   Number of children: 0   Years of education: Not on file   Highest education level: Not on file  Occupational History   Occupation: disabled  Tobacco Use   Smoking status: Never   Smokeless tobacco: Never  Vaping Use   Vaping status: Never Used  Substance and Sexual Activity   Alcohol use: Not Currently    Comment: beer and mixed drink maybe 5  times a month   Drug use: No   Sexual activity: Never  Other Topics Concern   Not on file  Social History Narrative   Not on file   Social Drivers of Health   Financial Resource Strain: Low Risk  (03/11/2024)   Overall Financial Resource Strain (CARDIA)    Difficulty of Paying Living Expenses: Not hard at all  Food Insecurity: No Food Insecurity (03/11/2024)   Hunger Vital Sign    Worried About Running Out of Food in the Last Year: Never true    Ran Out of Food in the Last Year: Never true  Transportation Needs: No Transportation Needs  (03/11/2024)   PRAPARE - Administrator, Civil Service (Medical): No    Lack of Transportation (Non-Medical): No  Physical Activity: Inactive (03/11/2024)   Exercise Vital Sign    Days of Exercise per Week: 0 days    Minutes of Exercise per Session: 0 min  Stress: No Stress Concern Present (03/11/2024)   [employer] of Occupational Health - Occupational Stress Questionnaire    Feeling of Stress: Only a little  Social Connections: Socially Isolated (03/11/2024)   Social Connection and Isolation Panel    Frequency of Communication with Friends and Family: More than three times a week    Frequency of Social Gatherings  with Friends and Family: Twice a week    Attends Religious Services: Never    Database administrator or Organizations: No    Attends Engineer, structural: Never    Marital Status: Never married   Family History  Problem Relation Age of Onset   Breast cancer Mother    Cancer Mother        small intestine   Liver cancer Mother    Diabetes Mother    Diabetes Father    Diabetes Brother    Hypertension Maternal Grandmother    Heart Problems Maternal Grandmother    Diabetes Paternal Grandmother    Diabetes Paternal Grandfather    Diabetes Brother     ASSESSMENT Recent Results: The most recent result is correlated with 67.5 mg per week: Lab Results  Component Value Date   INR 3.0 03/16/2024   INR 3.0 02/17/2024   INR 2.8 01/20/2024    Anticoagulation Dosing: Description   Take two (2) of your 5 mg peach-colored warfarin tablets, by mouth, once-daily, all days of the week EXCEPT on MONDAYS AND THURSDAYS, take ONLY one-and-one-half (1 & 1/2) tablets on MONDAYS AND THURSDAYS.     INR today: Therapeutic  PLAN Weekly dose was decreased by 4% to 65 mg per week  Patient Instructions  Patient instructed to take medications as defined in the Anti-coagulation Track section of this encounter.  Patient instructed to take today's dose.  Patient  instructed to take  two (2) of your 5 mg peach-colored warfarin tablets, by mouth, once-daily, all days of the week EXCEPT on MONDAYS AND THURSDAYS, take ONLY one-and-one-half (1 & 1/2) tablets on MONDAYS AND THURSDAYS Patient verbalized understanding of these instructions.  Patient advised to contact clinic or seek medical attention if signs/symptoms of bleeding or thromboembolism occur.  Patient verbalized understanding by repeating back information and was advised to contact me if further medication-related questions arise. Patient was also provided an information handout.  Follow-up Return in 3 weeks (on 04/06/2024) for Follow up INR.  Lynwood KATHEE Lites, PharmD, CPP Clinical Pharmacist Practitioner  15 minutes spent face-to-face with the patient during the encounter. 50% of time spent on education, including signs/sx bleeding and clotting, as well as food and drug interactions with warfarin. 50% of time was spent on fingerprick POC INR sample collection,processing, results determination, and documentation in [email or web address].

## 2024-03-16 NOTE — Patient Instructions (Signed)
 Patient instructed to take medications as defined in the Anti-coagulation Track section of this encounter.  Patient instructed to take today's dose.  Patient instructed to take  two (2) of your 5 mg peach-colored warfarin tablets, by mouth, once-daily, all days of the week EXCEPT on MONDAYS AND THURSDAYS, take ONLY one-and-one-half (1 & 1/2) tablets on MONDAYS AND THURSDAYS Patient verbalized understanding of these instructions.

## 2024-03-17 NOTE — Progress Notes (Signed)
Evaluation and management procedures were performed by the Clinical Pharmacy Practitioner under my supervision and collaboration. I have reviewed the Practitioner's note and chart, and I agree with the management and plan as documented above. ° °

## 2024-03-21 DIAGNOSIS — Z794 Long term (current) use of insulin: Secondary | ICD-10-CM | POA: Diagnosis not present

## 2024-03-21 DIAGNOSIS — E1142 Type 2 diabetes mellitus with diabetic polyneuropathy: Secondary | ICD-10-CM | POA: Diagnosis not present

## 2024-03-21 DIAGNOSIS — Z89512 Acquired absence of left leg below knee: Secondary | ICD-10-CM | POA: Diagnosis not present

## 2024-03-21 DIAGNOSIS — K760 Fatty (change of) liver, not elsewhere classified: Secondary | ICD-10-CM | POA: Diagnosis not present

## 2024-03-21 DIAGNOSIS — Z7984 Long term (current) use of oral hypoglycemic drugs: Secondary | ICD-10-CM | POA: Diagnosis not present

## 2024-03-21 DIAGNOSIS — G4733 Obstructive sleep apnea (adult) (pediatric): Secondary | ICD-10-CM | POA: Diagnosis not present

## 2024-03-21 DIAGNOSIS — Z7901 Long term (current) use of anticoagulants: Secondary | ICD-10-CM | POA: Diagnosis not present

## 2024-03-21 DIAGNOSIS — E7800 Pure hypercholesterolemia, unspecified: Secondary | ICD-10-CM | POA: Diagnosis not present

## 2024-03-21 DIAGNOSIS — Z8673 Personal history of transient ischemic attack (TIA), and cerebral infarction without residual deficits: Secondary | ICD-10-CM | POA: Diagnosis not present

## 2024-03-21 DIAGNOSIS — Z9181 History of falling: Secondary | ICD-10-CM | POA: Diagnosis not present

## 2024-03-21 DIAGNOSIS — Z86718 Personal history of other venous thrombosis and embolism: Secondary | ICD-10-CM | POA: Diagnosis not present

## 2024-03-21 DIAGNOSIS — H3411 Central retinal artery occlusion, right eye: Secondary | ICD-10-CM | POA: Diagnosis not present

## 2024-03-21 DIAGNOSIS — Z89511 Acquired absence of right leg below knee: Secondary | ICD-10-CM | POA: Diagnosis not present

## 2024-03-21 DIAGNOSIS — Z96643 Presence of artificial hip joint, bilateral: Secondary | ICD-10-CM | POA: Diagnosis not present

## 2024-03-23 DIAGNOSIS — H3411 Central retinal artery occlusion, right eye: Secondary | ICD-10-CM | POA: Diagnosis not present

## 2024-03-27 ENCOUNTER — Telehealth: Payer: Self-pay

## 2024-03-27 NOTE — Telephone Encounter (Signed)
 Received patient's patient assistance meds. Received Tresiba  x 1 Patient called and made aware.

## 2024-03-30 NOTE — Progress Notes (Signed)
 Internal Medicine Attending:  I reviewed the AWV findings of the medical professional who conducted the visit. I was present in the office suite and immediately available to provide assistance and direction throughout the time the service was provided.

## 2024-03-31 DIAGNOSIS — E7800 Pure hypercholesterolemia, unspecified: Secondary | ICD-10-CM | POA: Diagnosis not present

## 2024-03-31 DIAGNOSIS — Z7984 Long term (current) use of oral hypoglycemic drugs: Secondary | ICD-10-CM | POA: Diagnosis not present

## 2024-03-31 DIAGNOSIS — Z96643 Presence of artificial hip joint, bilateral: Secondary | ICD-10-CM | POA: Diagnosis not present

## 2024-03-31 DIAGNOSIS — H3411 Central retinal artery occlusion, right eye: Secondary | ICD-10-CM | POA: Diagnosis not present

## 2024-03-31 DIAGNOSIS — K760 Fatty (change of) liver, not elsewhere classified: Secondary | ICD-10-CM | POA: Diagnosis not present

## 2024-03-31 DIAGNOSIS — Z9181 History of falling: Secondary | ICD-10-CM | POA: Diagnosis not present

## 2024-03-31 DIAGNOSIS — E1142 Type 2 diabetes mellitus with diabetic polyneuropathy: Secondary | ICD-10-CM | POA: Diagnosis not present

## 2024-03-31 DIAGNOSIS — Z794 Long term (current) use of insulin: Secondary | ICD-10-CM | POA: Diagnosis not present

## 2024-04-02 ENCOUNTER — Other Ambulatory Visit: Payer: Self-pay

## 2024-04-02 ENCOUNTER — Ambulatory Visit

## 2024-04-02 ENCOUNTER — Ambulatory Visit: Admitting: Pharmacist

## 2024-04-02 VITALS — BP 140/72 | HR 73 | Temp 97.8°F | Ht 72.0 in | Wt 218.6 lb

## 2024-04-02 DIAGNOSIS — Z0000 Encounter for general adult medical examination without abnormal findings: Secondary | ICD-10-CM

## 2024-04-02 DIAGNOSIS — J309 Allergic rhinitis, unspecified: Secondary | ICD-10-CM

## 2024-04-02 DIAGNOSIS — I10 Essential (primary) hypertension: Secondary | ICD-10-CM

## 2024-04-02 DIAGNOSIS — G479 Sleep disorder, unspecified: Secondary | ICD-10-CM

## 2024-04-02 DIAGNOSIS — M79641 Pain in right hand: Secondary | ICD-10-CM

## 2024-04-02 DIAGNOSIS — H349 Unspecified retinal vascular occlusion: Secondary | ICD-10-CM

## 2024-04-02 DIAGNOSIS — H3410 Central retinal artery occlusion, unspecified eye: Secondary | ICD-10-CM

## 2024-04-02 DIAGNOSIS — E7800 Pure hypercholesterolemia, unspecified: Secondary | ICD-10-CM | POA: Diagnosis not present

## 2024-04-02 DIAGNOSIS — E1142 Type 2 diabetes mellitus with diabetic polyneuropathy: Secondary | ICD-10-CM

## 2024-04-02 DIAGNOSIS — Z7901 Long term (current) use of anticoagulants: Secondary | ICD-10-CM

## 2024-04-02 DIAGNOSIS — Z1211 Encounter for screening for malignant neoplasm of colon: Secondary | ICD-10-CM

## 2024-04-02 LAB — POCT GLYCOSYLATED HEMOGLOBIN (HGB A1C): HbA1c, POC (controlled diabetic range): 6.9 % (ref 0.0–7.0)

## 2024-04-02 LAB — GLUCOSE, CAPILLARY: Glucose-Capillary: 101 mg/dL — ABNORMAL HIGH (ref 70–99)

## 2024-04-02 LAB — POCT INR: INR: 2.4 (ref 2.0–3.0)

## 2024-04-02 MED ORDER — ZOSTER VAC RECOMB ADJUVANTED 50 MCG/0.5ML IM SUSR
0.5000 mL | Freq: Once | INTRAMUSCULAR | 1 refills | Status: AC
  Filled 2024-04-02: qty 1, 1d supply, fill #0

## 2024-04-02 NOTE — Progress Notes (Signed)
Evaluation and management procedures were performed by the Clinical Pharmacy Practitioner under my supervision and collaboration. I have reviewed the Practitioner's note and chart, and I agree with the management and plan as documented above. ° °

## 2024-04-02 NOTE — Patient Instructions (Addendum)
 Today we discussed the following medical conditions and plan:   Sleep/Allergies  -melatonin  -just take the allergy medication once a day, you can take it at night  -Flonaze once a day   Colon cancer screening -with family history I would recommend that you get colonoscopy when you are able  Blood Pressure  -Olmesartan  40 mg (2 tablets a day) -keep a blood pressure log -bring your blood pressure cuff with you to your next appointment to have it checked  Diabetes  -make sure you get your eye exam scheduled  -I'm checking your A1C  Vaccines  -shingles vaccine; you can go to the pharmacy in this building!  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 Please call our clinic at [phone] if you have any questions or concerns. The best time to call is Monday-Friday from 9am-4pm, but there is someone available 24/7. If you need medication refills, please notify your pharmacy one week in advance and they will send us  a request.   Thank you for trusting me with your care. Wishing you the best!   Sallyanne Primas, DO  [facility]

## 2024-04-02 NOTE — Patient Instructions (Signed)
 Patient instructed to take medications as defined in the Anti-coagulation Track section of this encounter.  Patient instructed to take today's dose.  Patient instructed to take  two (2) of your 5 mg peach-colored warfarin tablets, by mouth, once-daily, all days of the week EXCEPT on MONDAYS AND THURSDAYS, take ONLY one-and-one-half (1 & 1/2) tablets on MONDAYS AND THURSDAYS Patient verbalized understanding of these instructions.

## 2024-04-02 NOTE — Assessment & Plan Note (Addendum)
 Last lipid profile 07/02/2023 WNL, LDL 72 Currently on Rosuvastatin  20 mg daily  -continue rosuvastatin  20 mg daily  -lipid profile ordered

## 2024-04-02 NOTE — Progress Notes (Signed)
 Anticoagulation Management Gregory May is a 63 y.o. male who reports to the clinic for monitoring of warfarin treatment.    Indication: Central artery retinal occlusion; RIGHT retinal embolus; Long term current use of oral anticoagulation with warfarin, target INR range 2.0 - 3.0   Duration: indefinite Supervising physician: Jone Dauphin, MD  Anticoagulation Clinic Visit History: Patient does not report signs/symptoms of bleeding or thromboembolism  Other recent changes: No diet, medications, lifestyle changes cited by the patient at this visit to me.  Anticoagulation Episode Summary     Current INR goal:  2.0-3.0  TTR:  76.9% (1.9 y)  Next INR check:  04/27/2024  INR from last check:  2.4 (04/02/2024)  Weekly max warfarin dose:  --  Target end date:  --  INR check location:  --  Preferred lab:  --  Send INR reminders to:  --   Indications   Pulmonary emboli (HCC) (Resolved) [I26.99] DVT (deep venous thrombosis) (HCC) (Resolved) [I82.409] Retinal artery occlusion central [H34.10] Right retinal embolus [H34.9] Long term (current) use of anticoagulants [Z79.01]        Comments:  --         No Known Allergies  Current Outpatient Medications:    Accu-Chek FastClix Lancets MISC, Use Accu Chek Fastclix lancets to check blood sugar three times daily. DX:E11.65, Disp: 300 each, Rfl: 2   ACCU-CHEK GUIDE test strip, TEST BLOOD SUGAR THREE TIMES DAILY AS DIRECTED, Disp: 300 strip, Rfl: 3   acetaminophen  (TYLENOL ) 500 MG tablet, Take 1,000 mg by mouth as needed for mild pain or moderate pain., Disp: , Rfl:    fluticasone  (FLONASE ) 50 MCG/ACT nasal spray, USE 2 SPRAY(S) IN EACH NOSTRIL AT BEDTIME AS NEEDED FOR  SINUS  DRAINAGE, Disp: 16 g, Rfl: 0   gabapentin  (NEURONTIN ) 300 MG capsule, TAKE 1 CAPSULE FOUR TIMES DAILY AS NEEDED, Disp: 360 capsule, Rfl: 3   insulin  aspart (NOVOLOG ) 100 UNIT/ML injection, Take 5 units with breakfast and 10 units with supper, okay to increase up to  15 units with supper for hyperglycemia., Disp: 10 mL, Rfl: 4   Insulin  Syringes, Disposable, U-100 0.5 ML MISC, Use to inject insulin , Disp: 100 each, Rfl: 2   metFORMIN  (GLUCOPHAGE -XR) 750 MG 24 hr tablet, Take 2 tablets (1,500 mg total) by mouth daily., Disp: 180 tablet, Rfl: 3   Multiple Vitamin (MULTIVITAMIN WITH MINERALS) TABS tablet, Take 1 tablet by mouth every morning. Centrum, Disp: , Rfl:    olmesartan  (BENICAR ) 20 MG tablet, Take 1 tablet (20 mg total) by mouth daily., Disp: 90 tablet, Rfl: 3   pioglitazone  (ACTOS ) 30 MG tablet, Take 1 tablet (30 mg total) by mouth daily., Disp: 90 tablet, Rfl: 3   prednisoLONE  acetate (PRED FORTE ) 1 % ophthalmic suspension, Place 1 drop into the right eye every evening., Disp: , Rfl:    rosuvastatin  (CRESTOR ) 20 MG tablet, Take 1 tablet (20 mg total) by mouth daily., Disp: 90 tablet, Rfl: 3   warfarin (COUMADIN ) 5 MG tablet, Take 2 tablets (10 mg total) by mouth daily at 4 PM., Disp: 56 tablet, Rfl: 3   zinc  sulfate 220 (50 Zn) MG capsule, Take 1 capsule (220 mg total) by mouth daily., Disp: , Rfl:    APPLE CIDER VINEGAR PO, Take 1 tablet by mouth daily. (Patient not taking: Reported on 04/02/2024), Disp: , Rfl:    ascorbic acid  (VITAMIN C ) 1000 MG tablet, Take 1 tablet (1,000 mg total) by mouth daily. (Patient not taking: Reported on 04/02/2024),  Disp: , Rfl:    doxycycline  (VIBRA -TABS) 100 MG tablet, Take 1 tablet (100 mg total) by mouth 2 (two) times daily. (Patient not taking: Reported on 04/02/2024), Disp: 28 tablet, Rfl: 0   empagliflozin  (JARDIANCE ) 25 MG TABS tablet, Take 1 tablet (25 mg total) by mouth daily. (Patient not taking: Reported on 04/02/2024), Disp: 90 tablet, Rfl: 3   HYDROcodone -acetaminophen  (NORCO/VICODIN) 5-325 MG tablet, Take 1 tablet by mouth every 6 (six) hours as needed for moderate pain. 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 Date of diagnosis February 08 2014 Duration of anticoagulation: 6 months. End date 08/12/2014.  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 Duration of anticoagulation: 8 months. End date 10/11/2014.  Anticoagulant: Lovenox  120 units daily Switched to Eliquis  on 05/25/2014 per patient preference    Pulmonary embolism (HCC)    Sebaceous cyst    on back of neck   Social History   Socioeconomic History   Marital status: Single    Spouse name: Not on file   Number of children: 0   Years of education: Not on file   Highest education level: Not on file  Occupational History   Occupation: disabled  Tobacco Use   Smoking status: Never   Smokeless tobacco: Never  Vaping Use   Vaping status: Never Used  Substance and Sexual Activity   Alcohol use: Not Currently    Comment: beer and mixed drink maybe 5  times a month   Drug use: No   Sexual activity: Never  Other Topics Concern   Not on file  Social History Narrative   Not on file   Social Drivers of Health   Financial Resource Strain: Low Risk  (03/11/2024)   Overall Financial Resource Strain (CARDIA)    Difficulty of Paying Living Expenses: Not hard at all  Food Insecurity: No Food Insecurity (03/11/2024)   Hunger Vital Sign    Worried About Running Out of Food in the Last Year: Never true    Ran Out of Food in the Last Year:  Never true  Transportation Needs: No Transportation Needs (03/11/2024)   PRAPARE - Administrator, Civil Service (Medical): No    Lack of Transportation (Non-Medical): No  Physical Activity: Inactive (03/11/2024)   Exercise Vital Sign    Days of Exercise per Week: 0 days    Minutes of Exercise per Session: 0 min  Stress: No Stress Concern Present (03/11/2024)   [employer] of Occupational Health - Occupational Stress Questionnaire    Feeling of Stress: Only a little  Social Connections: Socially Isolated (03/11/2024)   Social Connection and Isolation Panel    Frequency of Communication with Friends and  Family: More than three times a week    Frequency of Social Gatherings with Friends and Family: Twice a week    Attends Religious Services: Never    Database administrator or Organizations: No    Attends Engineer, structural: Never    Marital Status: Never married   Family History  Problem Relation Age of Onset   Breast cancer Mother    Cancer Mother        small intestine   Liver cancer Mother    Diabetes Mother    Diabetes Father    Diabetes Brother    Hypertension Maternal Grandmother    Heart Problems Maternal Grandmother    Diabetes Paternal Grandmother    Diabetes Paternal Grandfather    Diabetes Brother     ASSESSMENT Recent Results: The most recent result is correlated with 65 mg per week: Lab Results  Component Value Date   INR 2.4 04/02/2024   INR 3.0 03/16/2024   INR 3.0 02/17/2024    Anticoagulation Dosing: Description   Take two (2) of your 5 mg peach-colored warfarin tablets, by mouth, once-daily, all days of the week EXCEPT on MONDAYS AND THURSDAYS, take ONLY one-and-one-half (1 & 1/2) tablets on MONDAYS AND THURSDAYS.     INR today: Therapeutic  PLAN Weekly dose was unchanged.   Patient Instructions  Patient instructed to take medications as defined in the Anti-coagulation Track section of this encounter.  Patient  instructed to take today's dose.  Patient instructed to take  two (2) of your 5 mg peach-colored warfarin tablets, by mouth, once-daily, all days of the week EXCEPT on MONDAYS AND THURSDAYS, take ONLY one-and-one-half (1 & 1/2) tablets on MONDAYS AND THURSDAYS. Patient verbalized understanding of these instructions.  Patient advised to contact clinic or seek medical attention if signs/symptoms of bleeding or thromboembolism occur.  Patient verbalized understanding by repeating back information and was advised to contact me if further medication-related questions arise. Patient was also provided an information handout.  Follow-up Return in 25 days (on 04/27/2024) for Follow up INR.  Lynwood KATHEE Lites, PharmD, CPP Clinical Pharmacist Practitioner  15 minutes spent face-to-face with the patient during the encounter. 50% of time spent on education, including signs/sx bleeding and clotting, as well as food and drug interactions with warfarin. 50% of time was spent on fingerprick POC INR sample collection,processing, results determination, and documentation in [email or web address].

## 2024-04-02 NOTE — Assessment & Plan Note (Addendum)
 Patient has family history of mother with colon cancer diagnosed at 60, passing away at age 61. 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 He lives with his Aunt who is 46 and they take care of each other, but she drives him. FOBT was ordered at previous visit but patient was unable to bring sample to this appointment. He believes he has a Cologuard from before, but is unsure if expired. Patients denies blood in stool, blood when wiping, or dark/black stool. Patient is on chronic warfarin.  Patient has not gotten shingles vaccine, encouraged to get vaccine at pharmacy -Cologuard ordered  -Ordered Shingles vaccine, patient received it after appointment at pharmacy.

## 2024-04-02 NOTE — Progress Notes (Signed)
 CC: HTN f/u  HPI:  [name] is a 63 y.o. 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 Please see problem based assessment and plan for additional details.  Last clinic appointment: 03/02/2024 discussed HTN (Olmesartan  20 mg, previously taking lisinopril  5 mg) Diabetes (A1C 6.6 on 01/08/2024 following [facility]), ordered FOBT and was instructed to bring to clinic, post below the knee amputation of RLE (wear prosthetic leg), central retinal artery occlusion of right eye (followed by Dr. Robinson, instructed to f/u regarding new worsening vision in left eye and to continue warfarin), hypercholesterolemia (Crestor  20 mg)  Last Pertinent Labs Documented:     Latest Ref Rng & Units 04/02/2024    2:19 PM 10/01/2023   10:48 AM 07/02/2023    2:34 PM  BMP  Glucose 70 - 99 mg/dL 893  880  888   BUN 8 - 27 mg/dL 9  12  15    Creatinine 0.76 - 1.27 mg/dL 9.24  9.29  9.26   BUN/Creat Ratio 10 - 24 12  SEE NOTE:    Sodium 134 - 144 mmol/L 139  139  137   Potassium 3.5 - 5.2 mmol/L 4.1  4.3  4.1   Chloride 96 - 106 mmol/L 100  102  100   CO2 20 - 29 mmol/L 22  28  30    Calcium  8.6 - 10.2 mg/dL 9.4  9.1  9.8        Latest Ref Rng & Units 10/03/2022    2:13 AM 10/01/2022    5:02 AM 09/30/2022    4:06 AM  CBC  WBC 4.0 - 10.5 K/uL 12.8  12.6  14.0   Hemoglobin 13.0 - 17.0 g/dL 86.7  87.1  88.1   Hematocrit 39.0 - 52.0 % 39.1  39.4  36.3   Platelets 150 - 400 K/uL 413  369  325     Lab Results  Component Value Date   HGBA1C 6.9 04/02/2024   HGBA1C 6.6 (A) 01/08/2024   HGBA1C 6.8 (H) 10/01/2023     Past Medical History:  Diagnosis Date   Arthritis    bilateral hips   Cutaneous abscess of left foot    Deep vein thrombosis (DVT) (HCC)    Diabetes mellitus     type II   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 Date of diagnosis February 08 2014 Duration of anticoagulation: 6 months. End date 08/12/2014.  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 Duration of anticoagulation: 8 months. End date 10/11/2014.  Anticoagulant: Lovenox  120 units daily Switched to Eliquis  on 05/25/2014 per patient preference    Pulmonary embolism (HCC)    Sebaceous cyst    on back of neck    Current Outpatient Medications on File Prior to Visit  Medication Sig Dispense  Refill   Accu-Chek FastClix Lancets MISC Use Accu Chek Fastclix lancets to check blood sugar three times daily. DX:E11.65 300 each 2   ACCU-CHEK GUIDE test strip TEST BLOOD SUGAR THREE TIMES DAILY AS DIRECTED 300 strip 3   acetaminophen  (TYLENOL ) 500 MG tablet Take 1,000 mg by mouth as needed for mild pain or moderate pain.     APPLE CIDER VINEGAR PO Take 1 tablet by mouth daily. (Patient not taking: Reported on 04/02/2024)     ascorbic acid  (VITAMIN C ) 1000 MG tablet Take 1 tablet (1,000 mg total) by mouth daily. (Patient not taking: Reported on 04/02/2024)     doxycycline  (VIBRA -TABS) 100 MG tablet Take 1 tablet (100 mg total) by mouth 2 (two) times daily. (Patient not taking: Reported on 04/02/2024) 28 tablet 0   empagliflozin  (JARDIANCE ) 25 MG TABS tablet Take 1 tablet (25 mg total) by mouth daily. (Patient not taking: Reported on 04/02/2024) 90 tablet 3   fluticasone  (FLONASE ) 50 MCG/ACT nasal spray USE 2 SPRAY(S) IN EACH NOSTRIL AT BEDTIME AS  NEEDED FOR  SINUS  DRAINAGE 16 g 0   gabapentin  (NEURONTIN ) 300 MG capsule TAKE 1 CAPSULE FOUR TIMES DAILY AS NEEDED 360 capsule 3   HYDROcodone -acetaminophen  (NORCO/VICODIN) 5-325 MG tablet Take 1 tablet by mouth every 6 (six) hours as needed for moderate pain. (Patient not taking: Reported on 04/02/2024) 30 tablet 0   insulin  aspart (NOVOLOG ) 100 UNIT/ML injection Take 5 units with breakfast and 10 units with supper, okay to increase up to 15 units with supper for hyperglycemia. 10 mL 4   Insulin  Syringes, Disposable, U-100 0.5 ML MISC Use to inject insulin  100 each 2   metFORMIN  (GLUCOPHAGE -XR) 750 MG 24 hr tablet Take 2 tablets (1,500 mg total) by mouth daily. 180 tablet 3   Multiple Vitamin (MULTIVITAMIN WITH MINERALS) TABS tablet Take 1 tablet by mouth every morning. Centrum     pioglitazone  (ACTOS ) 30 MG tablet Take 1 tablet (30 mg total) by mouth daily. 90 tablet 3   prednisoLONE  acetate (PRED FORTE ) 1 % ophthalmic suspension Place 1 drop into the right eye every evening.     rosuvastatin  (CRESTOR ) 20 MG tablet Take 1 tablet (20 mg total) by mouth daily. 90 tablet 3   warfarin (COUMADIN ) 5 MG tablet Take 2 tablets (10 mg total) by mouth daily at 4 PM. 56 tablet 3   zinc  sulfate 220 (50 Zn) MG capsule Take 1 capsule (220 mg total) by mouth daily.     No current facility-administered medications on file prior to visit.    Family History  Problem Relation Age of Onset   Breast cancer Mother    Cancer Mother        small intestine   Liver cancer Mother    Diabetes Mother    Diabetes Father    Diabetes Brother    Hypertension Maternal Grandmother    Heart Problems Maternal Grandmother    Diabetes Paternal Grandmother    Diabetes Paternal Grandfather    Diabetes Brother     Social History   Socioeconomic History   Marital status: Single    Spouse name: Not on file   Number of children: 0   Years of education: Not on file   Highest education level: Not on file  Occupational  History   Occupation: disabled  Tobacco Use   Smoking status: Never   Smokeless tobacco: Never  Vaping Use   Vaping status: Never Used  Substance and Sexual Activity   Alcohol  use: Not Currently    Comment: beer and mixed drink maybe 5  times a month   Drug use: No   Sexual activity: Never  Other Topics Concern   Not on file  Social History Narrative   Not on file   Social Drivers of Health   Financial Resource Strain: Low Risk  (03/11/2024)   Overall Financial Resource Strain (CARDIA)    Difficulty of Paying Living Expenses: Not hard at all  Food Insecurity: No Food Insecurity (03/11/2024)   Hunger Vital Sign    Worried About Running Out of Food in the Last Year: Never true    Ran Out of Food in the Last Year: Never true  Transportation Needs: No Transportation Needs (03/11/2024)   PRAPARE - Administrator, Civil Service (Medical): No    Lack of Transportation (Non-Medical): No  Physical Activity: Inactive (03/11/2024)   Exercise Vital Sign    Days of Exercise per Week: 0 days    Minutes of Exercise per Session: 0 min  Stress: No Stress Concern Present (03/11/2024)   [employer] of Occupational Health - Occupational Stress Questionnaire    Feeling of Stress: Only a little  Social Connections: Socially Isolated (03/11/2024)   Social Connection and Isolation Panel    Frequency of Communication with Friends and Family: More than three times a week    Frequency of Social Gatherings with Friends and Family: Twice a week    Attends Religious Services: Never    Database administrator or Organizations: No    Attends Banker Meetings: Never    Marital Status: Never married  Intimate Partner Violence: Not At Risk (03/11/2024)   Humiliation, Afraid, Rape, and Kick questionnaire    Fear of Current or Ex-Partner: No    Emotionally Abused: No    Physically Abused: No    Sexually Abused: No    Review of Systems: Review of Systems  Constitutional:   Negative for chills, fever and malaise/fatigue.  HENT:  Positive for congestion.   Respiratory:  Negative for shortness of breath.   Cardiovascular:  Negative for chest pain and leg swelling.  Gastrointestinal:  Positive for heartburn. Negative for abdominal pain, blood in stool and melena.  Genitourinary:  Negative for hematuria.  Neurological:  Negative for dizziness and headaches.  Psychiatric/Behavioral:  Positive for depression. Negative for suicidal ideas.      Vitals:   04/02/24 1314  BP: (!) 140/72  Pulse: 73  Temp: 97.8 F (36.6 C)  TempSrc: Oral  SpO2: 94%  Weight: 218 lb 9.6 oz (99.2 kg)  Height: 6' (1.829 m)    Physical Exam: Physical Exam Cardiovascular:     Rate and Rhythm: Normal rate and regular rhythm.     Heart sounds: Normal heart sounds. No murmur heard.    No friction rub. No gallop.  Pulmonary:     Effort: Pulmonary effort is normal.     Breath sounds: Normal breath sounds. No stridor. No wheezing, rhonchi or rales.  Skin:    General: Skin is warm and dry.     Coloration: Skin is not jaundiced or pale.     Findings: No bruising, erythema or rash.     Comments: Leg has good capillary refill, no erythema or signs of infection. Stump intact      Assessment & Plan:   Patient seen with Dr. Shawn Assessment & Plan Essential hypertension BP: (!) 140/72  . BP home was 127/81 Currently taking Olemsartan 20 mg.  Patient has kept an extensive log with photo in media. BP at home is around 140/ -increased Olmesartan  to 40 mg daily  -encouraged patient to continue keeping log of BP -told patient to bring BP cuff to next appointment to have it checked.  DM type 2 with diabetic peripheral neuropathy (HCC) A1C 5/28.2025 was 6.6 -A1C ordered -encouraged patient to schedule eye exam Healthcare maintenance Screening for colon cancer Patient has family history of mother with colon cancer diagnosed at 44, passing away at age 27. Patient has been putting off  colonoscopy due to bilateral BKA and difficulty with transportation. He lives with his Aunt who is 42 and they take care of each other, but she drives him. FOBT was ordered at previous visit but patient was unable to bring sample to this appointment. He believes he has a Cologuard from before, but is unsure if expired. Patients denies blood in stool, blood when wiping, or dark/black stool. Patient is on chronic warfarin.  Patient has not gotten shingles vaccine, encouraged to get vaccine at pharmacy -Cologuard ordered  -Ordered Shingles vaccine, patient received it after appointment at pharmacy.  Allergic sinusitis Trouble in sleeping Patient stated he was taking multiple of his allergy medications before bed because it helped him sleep. Informed patient that there are better alternatives for improvement of getting to sleep such as improving sleep hygiene or melatonin. He stated he has never tried melatonin and he would try that. I also encouraged him to take only 1 allergy pill a day and try Flonaze to help with allergic sinusitis  -instructed to take melatonin before bed  -instructed to take Flonaze daily  -encouraged to take 1 allergy pill such as Zyrtec a day or at night Right hand pain Patient stated that he was having right pinky nerve pain when arm was flexed. Informed patient this is likely from resting arm on wheelchair causing ulnar nerve entrapment. Pain goes away with extension of arm and shaking. Informed patient he could get a brace for his arm if it is difficult for him to remember to keep it straight  -educated about possible brace for ulnar nerve entrapment  Pure hypercholesterolemia Last lipid profile 07/02/2023 WNL, LDL 72 Currently on Rosuvastatin  20 mg daily  -continue rosuvastatin  20 mg daily  -lipid profile ordered   Orders Placed This Encounter  Procedures   Varicella-zoster vaccine subcutaneous   Lipid Profile   Basic metabolic panel with GFR   Cologuard   POC Hbg  A1C     Gregory May, D.O. [facility], PGY-1 Date 04/04/2024 Time 3:10 AM

## 2024-04-02 NOTE — Assessment & Plan Note (Addendum)
 A1C 5/28.2025 was 6.6 -A1C ordered -encouraged patient to schedule eye exam

## 2024-04-02 NOTE — Assessment & Plan Note (Addendum)
 BP: (!) 140/72  . BP home was 127/81 Currently taking Olemsartan 20 mg. Patient has kept an extensive log with photo in media. BP at home is around 140/ -increased Olmesartan  to 40 mg daily  -encouraged patient to continue keeping log of BP -told patient to bring BP cuff to next appointment to have it checked.

## 2024-04-03 ENCOUNTER — Other Ambulatory Visit: Payer: Self-pay

## 2024-04-03 DIAGNOSIS — I10 Essential (primary) hypertension: Secondary | ICD-10-CM

## 2024-04-03 LAB — BASIC METABOLIC PANEL WITH GFR
BUN/Creatinine Ratio: 12 (ref 10–24)
BUN: 9 mg/dL (ref 8–27)
CO2: 22 mmol/L (ref 20–29)
Calcium: 9.4 mg/dL (ref 8.6–10.2)
Chloride: 100 mmol/L (ref 96–106)
Creatinine, Ser: 0.75 mg/dL — ABNORMAL LOW (ref 0.76–1.27)
Glucose: 106 mg/dL — ABNORMAL HIGH (ref 70–99)
Potassium: 4.1 mmol/L (ref 3.5–5.2)
Sodium: 139 mmol/L (ref 134–144)
eGFR: 103 mL/min/1.73 (ref >59)

## 2024-04-03 LAB — LIPID PANEL
Chol/HDL Ratio: 3.1 ratio (ref 0.0–5.0)
Cholesterol, Total: 144 mg/dL (ref 100–199)
HDL: 47 mg/dL (ref >39)
LDL Chol Calc (NIH): 72 mg/dL (ref 0–99)
Triglycerides: 146 mg/dL (ref 0–149)
VLDL Cholesterol Cal: 25 mg/dL (ref 5–40)

## 2024-04-04 ENCOUNTER — Other Ambulatory Visit: Payer: Self-pay | Admitting: Student

## 2024-04-04 ENCOUNTER — Ambulatory Visit: Payer: Self-pay

## 2024-04-04 DIAGNOSIS — I10 Essential (primary) hypertension: Secondary | ICD-10-CM

## 2024-04-04 DIAGNOSIS — G479 Sleep disorder, unspecified: Secondary | ICD-10-CM | POA: Insufficient documentation

## 2024-04-04 DIAGNOSIS — M79641 Pain in right hand: Secondary | ICD-10-CM | POA: Insufficient documentation

## 2024-04-04 MED ORDER — OLMESARTAN MEDOXOMIL 40 MG PO TABS: 40.0000 mg | ORAL_TABLET | Freq: Every day | ORAL | 3 refills | Status: AC

## 2024-04-04 MED ORDER — OLMESARTAN MEDOXOMIL 20 MG PO TABS: 20.0000 mg | ORAL_TABLET | Freq: Every day | ORAL | 3 refills | Status: DC

## 2024-04-04 NOTE — Telephone Encounter (Signed)
 Called patient 04-04-2024 at 7:20 PM.    Discussed lab results as patient does not have MyChart set up. A1c elevated to 6.9 from 6.6.  Discussed dietary and lifestyle changes. BMP within normal limits.  Lipid panel within normal limits-LDL 72. Patient received text message about Cologuard and will look into completing this. Received shingles vaccine after clinic appointment. He has been taking 40 mg of his blood pressure medication without complaint.  Sallyanne Primas, DO [facility] Res[facility]

## 2024-04-04 NOTE — Assessment & Plan Note (Signed)
 Patient stated he was taking multiple of his allergy medications before bed because it helped him sleep. Informed patient that there are better alternatives for improvement of getting to sleep such as improving sleep hygiene or melatonin. He stated he has never tried melatonin and he would try that. I also encouraged him to take only 1 allergy pill a day and try Flonaze to help with allergic sinusitis  -instructed to take melatonin before bed  -instructed to take Flonaze daily  -encouraged to take 1 allergy pill such as Zyrtec a day or at night

## 2024-04-04 NOTE — Assessment & Plan Note (Signed)
 Patient stated that he was having right pinky nerve pain when arm was flexed. Informed patient this is likely from resting arm on wheelchair causing ulnar nerve entrapment. Pain goes away with extension of arm and shaking. Informed patient he could get a brace for his arm if it is difficult for him to remember to keep it straight  -educated about possible brace for ulnar nerve entrapment

## 2024-04-06 ENCOUNTER — Ambulatory Visit

## 2024-04-06 NOTE — Addendum Note (Signed)
 Addended by: SHAWN SICK on: 04/06/2024 07:56 PM   Modules accepted: Level of Service

## 2024-04-06 NOTE — Progress Notes (Signed)
 Internal Medicine Clinic Attending  I was physically present during the key portions of the resident provided service and participated in the medical decision making of patient's management care. I reviewed pertinent patient test results.  The assessment, diagnosis, and plan were formulated together and I agree with the documentation in the resident's note.  Shawn Sick, MD

## 2024-04-10 ENCOUNTER — Telehealth: Payer: Self-pay

## 2024-04-10 NOTE — Telephone Encounter (Signed)
 Received patient's patient assistance meds. Received Novolog  x 2 Patient called and made aware.

## 2024-04-12 ENCOUNTER — Other Ambulatory Visit: Payer: Self-pay | Admitting: Endocrinology

## 2024-04-12 DIAGNOSIS — E1165 Type 2 diabetes mellitus with hyperglycemia: Secondary | ICD-10-CM

## 2024-04-12 DIAGNOSIS — E1142 Type 2 diabetes mellitus with diabetic polyneuropathy: Secondary | ICD-10-CM

## 2024-04-12 DIAGNOSIS — E782 Mixed hyperlipidemia: Secondary | ICD-10-CM

## 2024-04-14 ENCOUNTER — Encounter: Payer: Self-pay | Admitting: Endocrinology

## 2024-04-14 ENCOUNTER — Ambulatory Visit (INDEPENDENT_AMBULATORY_CARE_PROVIDER_SITE_OTHER): Admitting: Endocrinology

## 2024-04-14 VITALS — BP 132/70 | HR 72 | Resp 20 | Ht 72.0 in | Wt 218.0 lb

## 2024-04-14 DIAGNOSIS — Z794 Long term (current) use of insulin: Secondary | ICD-10-CM

## 2024-04-14 DIAGNOSIS — E1165 Type 2 diabetes mellitus with hyperglycemia: Secondary | ICD-10-CM | POA: Diagnosis not present

## 2024-04-14 MED ORDER — TRESIBA FLEXTOUCH 100 UNIT/ML ~~LOC~~ SOPN: 5.0000 [IU] | PEN_INJECTOR | Freq: Every day | SUBCUTANEOUS | 4 refills | Status: DC

## 2024-04-14 NOTE — Patient Instructions (Signed)
 Start Tresiba  5 units daily in the morning. If morning glucose < 100 persistently, stop it.   Rest medications same.
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
## 2024-04-14 NOTE — Progress Notes (Signed)
 Patient's patient assistance given to him. Patient given Novolog  and Tresiba .

## 2024-04-16 DIAGNOSIS — E1142 Type 2 diabetes mellitus with diabetic polyneuropathy: Secondary | ICD-10-CM | POA: Diagnosis not present

## 2024-04-16 DIAGNOSIS — Z86718 Personal history of other venous thrombosis and embolism: Secondary | ICD-10-CM | POA: Diagnosis not present

## 2024-04-16 DIAGNOSIS — Z89512 Acquired absence of left leg below knee: Secondary | ICD-10-CM | POA: Diagnosis not present

## 2024-04-16 DIAGNOSIS — E7800 Pure hypercholesterolemia, unspecified: Secondary | ICD-10-CM | POA: Diagnosis not present

## 2024-04-16 DIAGNOSIS — Z96643 Presence of artificial hip joint, bilateral: Secondary | ICD-10-CM | POA: Diagnosis not present

## 2024-04-16 DIAGNOSIS — Z794 Long term (current) use of insulin: Secondary | ICD-10-CM | POA: Diagnosis not present

## 2024-04-16 DIAGNOSIS — Z7901 Long term (current) use of anticoagulants: Secondary | ICD-10-CM | POA: Diagnosis not present

## 2024-04-16 DIAGNOSIS — G4733 Obstructive sleep apnea (adult) (pediatric): Secondary | ICD-10-CM | POA: Diagnosis not present

## 2024-04-16 DIAGNOSIS — K760 Fatty (change of) liver, not elsewhere classified: Secondary | ICD-10-CM | POA: Diagnosis not present

## 2024-04-16 DIAGNOSIS — H3411 Central retinal artery occlusion, right eye: Secondary | ICD-10-CM | POA: Diagnosis not present

## 2024-04-16 DIAGNOSIS — Z7984 Long term (current) use of oral hypoglycemic drugs: Secondary | ICD-10-CM | POA: Diagnosis not present

## 2024-04-16 DIAGNOSIS — Z8673 Personal history of transient ischemic attack (TIA), and cerebral infarction without residual deficits: Secondary | ICD-10-CM | POA: Diagnosis not present

## 2024-04-16 DIAGNOSIS — Z9181 History of falling: Secondary | ICD-10-CM | POA: Diagnosis not present

## 2024-04-16 DIAGNOSIS — Z89511 Acquired absence of right leg below knee: Secondary | ICD-10-CM | POA: Diagnosis not present

## 2024-04-22 DIAGNOSIS — Z794 Long term (current) use of insulin: Secondary | ICD-10-CM | POA: Diagnosis not present

## 2024-04-22 DIAGNOSIS — Z602 Problems related to living alone: Secondary | ICD-10-CM | POA: Diagnosis not present

## 2024-04-22 DIAGNOSIS — E1142 Type 2 diabetes mellitus with diabetic polyneuropathy: Secondary | ICD-10-CM | POA: Diagnosis not present

## 2024-04-22 DIAGNOSIS — Z8673 Personal history of transient ischemic attack (TIA), and cerebral infarction without residual deficits: Secondary | ICD-10-CM | POA: Diagnosis not present

## 2024-04-22 DIAGNOSIS — Z89512 Acquired absence of left leg below knee: Secondary | ICD-10-CM | POA: Diagnosis not present

## 2024-04-22 DIAGNOSIS — Z96643 Presence of artificial hip joint, bilateral: Secondary | ICD-10-CM | POA: Diagnosis not present

## 2024-04-22 DIAGNOSIS — Z7901 Long term (current) use of anticoagulants: Secondary | ICD-10-CM | POA: Diagnosis not present

## 2024-04-22 DIAGNOSIS — H3411 Central retinal artery occlusion, right eye: Secondary | ICD-10-CM | POA: Diagnosis not present

## 2024-04-22 DIAGNOSIS — Z9181 History of falling: Secondary | ICD-10-CM | POA: Diagnosis not present

## 2024-04-22 DIAGNOSIS — Z86718 Personal history of other venous thrombosis and embolism: Secondary | ICD-10-CM | POA: Diagnosis not present

## 2024-04-22 DIAGNOSIS — K760 Fatty (change of) liver, not elsewhere classified: Secondary | ICD-10-CM | POA: Diagnosis not present

## 2024-04-22 DIAGNOSIS — Z7984 Long term (current) use of oral hypoglycemic drugs: Secondary | ICD-10-CM | POA: Diagnosis not present

## 2024-04-22 DIAGNOSIS — E7800 Pure hypercholesterolemia, unspecified: Secondary | ICD-10-CM | POA: Diagnosis not present

## 2024-04-22 DIAGNOSIS — Z89511 Acquired absence of right leg below knee: Secondary | ICD-10-CM | POA: Diagnosis not present

## 2024-04-22 DIAGNOSIS — G4733 Obstructive sleep apnea (adult) (pediatric): Secondary | ICD-10-CM | POA: Diagnosis not present

## 2024-04-27 ENCOUNTER — Ambulatory Visit: Admitting: Pharmacist

## 2024-04-27 DIAGNOSIS — H349 Unspecified retinal vascular occlusion: Secondary | ICD-10-CM | POA: Diagnosis not present

## 2024-04-27 DIAGNOSIS — Z7901 Long term (current) use of anticoagulants: Secondary | ICD-10-CM

## 2024-04-27 DIAGNOSIS — Z23 Encounter for immunization: Secondary | ICD-10-CM

## 2024-04-27 DIAGNOSIS — H3413 Central retinal artery occlusion, bilateral: Secondary | ICD-10-CM | POA: Diagnosis not present

## 2024-04-27 LAB — POCT INR: INR: 3.5 — AB (ref 2.0–3.0)

## 2024-04-27 NOTE — Progress Notes (Signed)
 Anticoagulation Management Gregory May is a 63 y.o. male who reports to the clinic for monitoring of warfarin treatment.    Indication: Central retinal artery occlusion, Right retinal artery embolism, Long term current use of oral anticoagulation with warfarin to maintain INR range 2.0 - 3.0.   Duration: indefinite Supervising physician: Mliss Foot, MD  Anticoagulation Clinic Visit History: Patient does not report signs/symptoms of bleeding or thromboembolism  Other recent changes: No diet, medications, lifestyle changes endorsed by the patient at this visit to me.  Anticoagulation Episode Summary     Current INR goal:  2.0-3.0  TTR:  76.1% (2 y)  Next INR check:  05/25/2024  INR from last check:  3.5 (04/27/2024)  Weekly max warfarin dose:  --  Target end date:  --  INR check location:  --  Preferred lab:  --  Send INR reminders to:  --   Indications   Pulmonary emboli (HCC) (Resolved) [I26.99] DVT (deep venous thrombosis) (HCC) (Resolved) [I82.409] Retinal artery occlusion central [H34.10] Right retinal embolus [H34.9] Long term (current) use of anticoagulants [Z79.01]        Comments:  --         No Known Allergies  Current Outpatient Medications:    Accu-Chek FastClix Lancets MISC, Use Accu Chek Fastclix lancets to check blood sugar three times daily. DX:E11.65, Disp: 300 each, Rfl: 2   ACCU-CHEK GUIDE test strip, TEST BLOOD SUGAR THREE TIMES DAILY AS DIRECTED, Disp: 300 strip, Rfl: 3   acetaminophen  (TYLENOL ) 500 MG tablet, Take 1,000 mg by mouth as needed for mild pain or moderate pain., Disp: , Rfl:    fluticasone  (FLONASE ) 50 MCG/ACT nasal spray, USE 2 SPRAY(S) IN EACH NOSTRIL AT BEDTIME AS NEEDED FOR  SINUS  DRAINAGE, Disp: 16 g, Rfl: 0   gabapentin  (NEURONTIN ) 300 MG capsule, TAKE 1 CAPSULE BY MOUTH 4 TIMES  DAILY AS NEEDED, Disp: 400 capsule, Rfl: 2   insulin  aspart (NOVOLOG ) 100 UNIT/ML injection, Take 5 units with breakfast and 10 units with supper,  okay to increase up to 15 units with supper for hyperglycemia., Disp: 10 mL, Rfl: 4   insulin  degludec (TRESIBA  FLEXTOUCH) 100 UNIT/ML FlexTouch Pen, Inject 5 Units into the skin daily., Disp: 15 mL, Rfl: 4   Insulin  Syringes, Disposable, U-100 0.5 ML MISC, Use to inject insulin , Disp: 100 each, Rfl: 2   metFORMIN  (GLUCOPHAGE -XR) 750 MG 24 hr tablet, TAKE 2 TABLETS BY MOUTH DAILY, Disp: 200 tablet, Rfl: 2   Multiple Vitamin (MULTIVITAMIN WITH MINERALS) TABS tablet, Take 1 tablet by mouth every morning. Centrum, Disp: , Rfl:    olmesartan  (BENICAR ) 40 MG tablet, Take 1 tablet (40 mg total) by mouth daily., Disp: 90 tablet, Rfl: 3   pioglitazone  (ACTOS ) 30 MG tablet, TAKE 1 TABLET BY MOUTH DAILY, Disp: 100 tablet, Rfl: 2   prednisoLONE  acetate (PRED FORTE ) 1 % ophthalmic suspension, Place 1 drop into the right eye every evening., Disp: , Rfl:    rosuvastatin  (CRESTOR ) 20 MG tablet, TAKE 1 TABLET BY MOUTH DAILY, Disp: 100 tablet, Rfl: 2   warfarin (COUMADIN ) 5 MG tablet, Take 2 tablets (10 mg total) by mouth daily at 4 PM. (Patient taking differently: Take 10 mg by mouth daily at 4 PM. Has been taking as shown:  Su-10mg M-7.5mg T-10mg W-10mg Th-7.5mg F-10mg Sa-10mg  Using 5 mg strength, peach-colored warfarin tablets.), Disp: 56 tablet, Rfl: 3   zinc  sulfate 220 (50 Zn) MG capsule, Take 1 capsule (220 mg total) by mouth daily., Disp: , Rfl:  Past Medical  History:  Diagnosis Date   Arthritis    bilateral hips   Cutaneous abscess of left foot    Deep vein thrombosis (DVT) (HCC)    Diabetes mellitus    type II   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 Date of diagnosis February 08 2014 Duration of anticoagulation: 6 months. End date 08/12/2014.  Anticoagulant: Lovenox  120 units daily Switched to Eliquis  on 05/25/2014      Ear drum perforation, right 03/06/2019   Non-pressure chronic ulcer of  right calf, limited to breakdown of skin (HCC) 04/17/2017   Pulmonary emboli (HCC) 02/08/2014   Date of diagnosis February 08 2014, on chest CTA Hospitalized for 3 days Had some symptoms of shortness of breath, and chest pain With intercurrent DVT of the left LE. Duration of anticoagulation: 8 months. End date 10/11/2014.  Anticoagulant: Lovenox  120 units daily Switched to Eliquis  on 05/25/2014 per patient preference    Pulmonary embolism (HCC)    Sebaceous cyst    on back of neck   Social History   Socioeconomic History   Marital status: Single    Spouse name: Not on file   Number of children: 0   Years of education: Not on file   Highest education level: Not on file  Occupational History   Occupation: disabled  Tobacco Use   Smoking status: Never   Smokeless tobacco: Never  Vaping Use   Vaping status: Never Used  Substance and Sexual Activity   Alcohol use: Not Currently    Comment: beer and mixed drink maybe 5  times a month   Drug use: No   Sexual activity: Never  Other Topics Concern   Not on file  Social History Narrative   Not on file   Social Drivers of Health   Financial Resource Strain: Low Risk  (03/11/2024)   Overall Financial Resource Strain (CARDIA)    Difficulty of Paying Living Expenses: Not hard at all  Food Insecurity: No Food Insecurity (03/11/2024)   Hunger Vital Sign    Worried About Running Out of Food in the Last Year: Never true    Ran Out of Food in the Last Year: Never true  Transportation Needs: No Transportation Needs (03/11/2024)   PRAPARE - Administrator, Civil Service (Medical): No    Lack of Transportation (Non-Medical): No  Physical Activity: Inactive (03/11/2024)   Exercise Vital Sign    Days of Exercise per Week: 0 days    Minutes of Exercise per Session: 0 min  Stress: No Stress Concern Present (03/11/2024)   [employer] of Occupational Health - Occupational Stress Questionnaire    Feeling of Stress: Only a little  Social  Connections: Socially Isolated (03/11/2024)   Social Connection and Isolation Panel    Frequency of Communication with Friends and Family: More than three times a week    Frequency of Social Gatherings with Friends and Family: Twice a week    Attends Religious Services: Never    Database administrator or Organizations: No    Attends Engineer, structural: Never    Marital Status: Never married   Family History  Problem Relation Age of Onset   Breast cancer Mother    Cancer Mother        small intestine   Liver cancer Mother    Diabetes Mother  Diabetes Father    Diabetes Brother    Hypertension Maternal Grandmother    Heart Problems Maternal Grandmother    Diabetes Paternal Grandmother    Diabetes Paternal Grandfather    Diabetes Brother     ASSESSMENT Recent Results: The most recent result is correlated with 65 mg warfarin per week: Lab Results  Component Value Date   INR 3.5 (A) 04/27/2024   INR 2.4 04/02/2024   INR 3.0 03/16/2024    Anticoagulation Dosing: Description   Take two (2) of your 5 mg strength, peach-colored warfarin tablets on Mondays, Wednesdays and Fridays. All OTHER DAYS (Sundays, Tuesdays, Thursdays and Saturdays), take only one-and-one-half (1 & 1/2) tablets.      INR today: Supratherapeutic  PLAN Weekly dose was decreased by 8% to 60 mg warfarin per week  Patient Instructions  Patient instructed to take medications as defined in the Anti-coagulation Track section of this encounter.  Patient instructed to take today's dose.  Patient instructed to take  two (2) of your 5 mg strength, peach-colored warfarin tablets on Mondays, Wednesdays and Fridays. All OTHER DAYS (Sundays, Tuesdays, Thursdays and Saturdays), take only one-and-one-half (1 & 1/2) tablets.  Patient verbalized understanding of these instructions.  Patient advised to contact clinic or seek medical attention if signs/symptoms of bleeding or thromboembolism occur.  Patient  verbalized understanding by repeating back information and was advised to contact me if further medication-related questions arise. Patient was also provided an information handout.  Follow-up Return in 4 weeks (on 05/25/2024) for Follow up INR.  Lynwood KATHEE Lites, PharmD, CPP Clinical Pharmacist Practitioner  15 minutes spent face-to-face with the patient during the encounter. 50% of time spent on education, including signs/sx bleeding and clotting, as well as food and drug interactions with warfarin. 50% of time was spent on fingerprick POC INR sample collection,processing, results determination, and documentation in [email or web address].

## 2024-04-27 NOTE — Patient Instructions (Signed)
 Patient instructed to take medications as defined in the Anti-coagulation Track section of this encounter.  Patient instructed to take today's dose.  Patient instructed to take  two (2) of your 5 mg strength, peach-colored warfarin tablets on Mondays, Wednesdays and Fridays. All OTHER DAYS (Sundays, Tuesdays, Thursdays and Saturdays), take only one-and-one-half (1 & 1/2) tablets.  Patient verbalized understanding of these instructions.

## 2024-04-28 DIAGNOSIS — Z794 Long term (current) use of insulin: Secondary | ICD-10-CM | POA: Diagnosis not present

## 2024-04-28 DIAGNOSIS — E7800 Pure hypercholesterolemia, unspecified: Secondary | ICD-10-CM | POA: Diagnosis not present

## 2024-04-28 DIAGNOSIS — Z89512 Acquired absence of left leg below knee: Secondary | ICD-10-CM | POA: Diagnosis not present

## 2024-04-28 DIAGNOSIS — Z8673 Personal history of transient ischemic attack (TIA), and cerebral infarction without residual deficits: Secondary | ICD-10-CM | POA: Diagnosis not present

## 2024-04-28 DIAGNOSIS — Z89511 Acquired absence of right leg below knee: Secondary | ICD-10-CM | POA: Diagnosis not present

## 2024-04-28 DIAGNOSIS — K760 Fatty (change of) liver, not elsewhere classified: Secondary | ICD-10-CM | POA: Diagnosis not present

## 2024-04-28 DIAGNOSIS — Z86718 Personal history of other venous thrombosis and embolism: Secondary | ICD-10-CM | POA: Diagnosis not present

## 2024-04-28 DIAGNOSIS — Z96643 Presence of artificial hip joint, bilateral: Secondary | ICD-10-CM | POA: Diagnosis not present

## 2024-04-28 DIAGNOSIS — E1142 Type 2 diabetes mellitus with diabetic polyneuropathy: Secondary | ICD-10-CM | POA: Diagnosis not present

## 2024-04-28 DIAGNOSIS — Z602 Problems related to living alone: Secondary | ICD-10-CM | POA: Diagnosis not present

## 2024-04-28 DIAGNOSIS — Z7901 Long term (current) use of anticoagulants: Secondary | ICD-10-CM | POA: Diagnosis not present

## 2024-04-28 DIAGNOSIS — H3411 Central retinal artery occlusion, right eye: Secondary | ICD-10-CM | POA: Diagnosis not present

## 2024-04-28 DIAGNOSIS — Z7984 Long term (current) use of oral hypoglycemic drugs: Secondary | ICD-10-CM | POA: Diagnosis not present

## 2024-04-28 DIAGNOSIS — Z9181 History of falling: Secondary | ICD-10-CM | POA: Diagnosis not present

## 2024-04-28 DIAGNOSIS — G4733 Obstructive sleep apnea (adult) (pediatric): Secondary | ICD-10-CM | POA: Diagnosis not present

## 2024-04-28 NOTE — Progress Notes (Signed)
Evaluation and management procedures were performed by the Clinical Pharmacy Practitioner under my supervision and collaboration. I have reviewed the Practitioner's note and chart, and I agree with the management and plan as documented above. ° °

## 2024-05-04 DIAGNOSIS — Z89511 Acquired absence of right leg below knee: Secondary | ICD-10-CM | POA: Diagnosis not present

## 2024-05-04 DIAGNOSIS — Z794 Long term (current) use of insulin: Secondary | ICD-10-CM | POA: Diagnosis not present

## 2024-05-04 DIAGNOSIS — Z602 Problems related to living alone: Secondary | ICD-10-CM | POA: Diagnosis not present

## 2024-05-04 DIAGNOSIS — Z89512 Acquired absence of left leg below knee: Secondary | ICD-10-CM | POA: Diagnosis not present

## 2024-05-04 DIAGNOSIS — Z9181 History of falling: Secondary | ICD-10-CM | POA: Diagnosis not present

## 2024-05-04 DIAGNOSIS — Z7901 Long term (current) use of anticoagulants: Secondary | ICD-10-CM | POA: Diagnosis not present

## 2024-05-04 DIAGNOSIS — H3411 Central retinal artery occlusion, right eye: Secondary | ICD-10-CM | POA: Diagnosis not present

## 2024-05-04 DIAGNOSIS — Z86718 Personal history of other venous thrombosis and embolism: Secondary | ICD-10-CM | POA: Diagnosis not present

## 2024-05-04 DIAGNOSIS — Z7984 Long term (current) use of oral hypoglycemic drugs: Secondary | ICD-10-CM | POA: Diagnosis not present

## 2024-05-04 DIAGNOSIS — G4733 Obstructive sleep apnea (adult) (pediatric): Secondary | ICD-10-CM | POA: Diagnosis not present

## 2024-05-04 DIAGNOSIS — E1142 Type 2 diabetes mellitus with diabetic polyneuropathy: Secondary | ICD-10-CM | POA: Diagnosis not present

## 2024-05-04 DIAGNOSIS — E7800 Pure hypercholesterolemia, unspecified: Secondary | ICD-10-CM | POA: Diagnosis not present

## 2024-05-04 DIAGNOSIS — K760 Fatty (change of) liver, not elsewhere classified: Secondary | ICD-10-CM | POA: Diagnosis not present

## 2024-05-04 DIAGNOSIS — Z8673 Personal history of transient ischemic attack (TIA), and cerebral infarction without residual deficits: Secondary | ICD-10-CM | POA: Diagnosis not present

## 2024-05-04 DIAGNOSIS — Z96643 Presence of artificial hip joint, bilateral: Secondary | ICD-10-CM | POA: Diagnosis not present

## 2024-05-21 DIAGNOSIS — H2101 Hyphema, right eye: Secondary | ICD-10-CM | POA: Diagnosis not present

## 2024-05-21 DIAGNOSIS — E113492 Type 2 diabetes mellitus with severe nonproliferative diabetic retinopathy without macular edema, left eye: Secondary | ICD-10-CM | POA: Diagnosis not present

## 2024-05-21 DIAGNOSIS — H524 Presbyopia: Secondary | ICD-10-CM | POA: Diagnosis not present

## 2024-05-21 DIAGNOSIS — H2521 Age-related cataract, morgagnian type, right eye: Secondary | ICD-10-CM | POA: Diagnosis not present

## 2024-05-21 LAB — OPHTHALMOLOGY REPORT-SCANNED

## 2024-05-22 ENCOUNTER — Ambulatory Visit: Payer: Self-pay | Admitting: Endocrinology

## 2024-05-22 ENCOUNTER — Encounter: Payer: Self-pay | Admitting: Endocrinology

## 2024-05-25 ENCOUNTER — Ambulatory Visit: Admitting: Pharmacist

## 2024-05-25 DIAGNOSIS — Z7901 Long term (current) use of anticoagulants: Secondary | ICD-10-CM

## 2024-05-25 DIAGNOSIS — H3411 Central retinal artery occlusion, right eye: Secondary | ICD-10-CM

## 2024-05-25 DIAGNOSIS — H349 Unspecified retinal vascular occlusion: Secondary | ICD-10-CM

## 2024-05-25 LAB — POCT INR: INR: 2 (ref 2.0–3.0)

## 2024-05-25 NOTE — Patient Instructions (Signed)
 Patient instructed to take medications as defined in the Anti-coagulation Track section of this encounter.  Patient instructed to take today's dose.  Patient instructed to take  two (2) of your 5 mg strength, peach-colored warfarin tablets on Mondays, Wednesdays and Fridays. All OTHER DAYS (Sundays, Tuesdays, Thursdays and Saturdays), take only one-and-one-half (1 & 1/2) tablets.  Patient verbalized understanding of these instructions.

## 2024-05-25 NOTE — Progress Notes (Signed)
 Anticoagulation Management Gregory May is a 63 y.o. male who reports to the clinic for monitoring of warfarin treatment.    Indication: Central retinal artery occlusion, Right retinal artery embolism, long term current use of oral anticoagulation with warfarin. Target INR range 2.0 - 3.0.   Duration: indefinite Supervising physician: Mliss Foot, MD  Anticoagulation Clinic Visit History: Patient does not report signs/symptoms of bleeding or thromboembolism  Other recent changes: No diet, medications, lifestyle changes identified or cited by the patient at this visit.  Anticoagulation Episode Summary     Current INR goal:  2.0-3.0  TTR:  75.8% (2.1 y)  Next INR check:  06/22/2024  INR from last check:  2.0 (05/25/2024)  Weekly max warfarin dose:  --  Target end date:  --  INR check location:  --  Preferred lab:  --  Send INR reminders to:  --   Indications   Pulmonary emboli (HCC) (Resolved) [I26.99] DVT (deep venous thrombosis) (HCC) (Resolved) [I82.409] Retinal artery occlusion central [H34.10] Right retinal embolus [H34.9] Long term (current) use of anticoagulants [Z79.01]        Comments:  --         No Known Allergies  Current Outpatient Medications:    Accu-Chek FastClix Lancets MISC, Use Accu Chek Fastclix lancets to check blood sugar three times daily. DX:E11.65, Disp: 300 each, Rfl: 2   ACCU-CHEK GUIDE test strip, TEST BLOOD SUGAR THREE TIMES DAILY AS DIRECTED, Disp: 300 strip, Rfl: 3   acetaminophen  (TYLENOL ) 500 MG tablet, Take 1,000 mg by mouth as needed for mild pain or moderate pain., Disp: , Rfl:    fluticasone  (FLONASE ) 50 MCG/ACT nasal spray, USE 2 SPRAY(S) IN EACH NOSTRIL AT BEDTIME AS NEEDED FOR  SINUS  DRAINAGE, Disp: 16 g, Rfl: 0   gabapentin  (NEURONTIN ) 300 MG capsule, TAKE 1 CAPSULE BY MOUTH 4 TIMES  DAILY AS NEEDED, Disp: 400 capsule, Rfl: 2   insulin  aspart (NOVOLOG ) 100 UNIT/ML injection, Take 5 units with breakfast and 10 units with  supper, okay to increase up to 15 units with supper for hyperglycemia., Disp: 10 mL, Rfl: 4   insulin  degludec (TRESIBA  FLEXTOUCH) 100 UNIT/ML FlexTouch Pen, Inject 5 Units into the skin daily., Disp: 15 mL, Rfl: 4   Insulin  Syringes, Disposable, U-100 0.5 ML MISC, Use to inject insulin , Disp: 100 each, Rfl: 2   metFORMIN  (GLUCOPHAGE -XR) 750 MG 24 hr tablet, TAKE 2 TABLETS BY MOUTH DAILY, Disp: 200 tablet, Rfl: 2   Multiple Vitamin (MULTIVITAMIN WITH MINERALS) TABS tablet, Take 1 tablet by mouth every morning. Centrum, Disp: , Rfl:    olmesartan  (BENICAR ) 40 MG tablet, Take 1 tablet (40 mg total) by mouth daily., Disp: 90 tablet, Rfl: 3   pioglitazone  (ACTOS ) 30 MG tablet, TAKE 1 TABLET BY MOUTH DAILY, Disp: 100 tablet, Rfl: 2   prednisoLONE  acetate (PRED FORTE ) 1 % ophthalmic suspension, Place 1 drop into the right eye every evening., Disp: , Rfl:    rosuvastatin  (CRESTOR ) 20 MG tablet, TAKE 1 TABLET BY MOUTH DAILY, Disp: 100 tablet, Rfl: 2   warfarin (COUMADIN ) 5 MG tablet, Take 2 tablets (10 mg total) by mouth daily at 4 PM. (Patient taking differently: Take 10 mg by mouth daily at 4 PM. 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 Date of diagnosis February 08 2014 Duration of anticoagulation: 6 months. End date 08/12/2014.  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 Duration of anticoagulation: 8 months. End date 10/11/2014.  Anticoagulant: Lovenox  120 units daily Switched to Eliquis  on 05/25/2014 per patient preference    Pulmonary embolism (HCC)    Sebaceous cyst    on back of neck   Social History   Socioeconomic History   Marital status: Single    Spouse name: Not on file   Number of children: 0   Years of education: Not on file   Highest education level: Not on file  Occupational History   Occupation: disabled  Tobacco Use   Smoking status: Never   Smokeless tobacco: Never  Vaping Use   Vaping status: Never Used  Substance and Sexual Activity   Alcohol use: Not Currently    Comment: beer and mixed drink maybe 5  times a month   Drug use: No   Sexual activity: Never  Other Topics Concern   Not on file  Social History Narrative   Not on file   Social Drivers of Health   Financial Resource Strain: Low Risk  (03/11/2024)   Overall Financial Resource Strain (CARDIA)    Difficulty of Paying Living Expenses: Not hard at all  Food Insecurity: No Food Insecurity (03/11/2024)   Hunger Vital Sign    Worried About Running Out of Food in the Last Year: Never true    Ran Out of Food in the Last Year: Never true  Transportation Needs: No Transportation Needs (03/11/2024)   PRAPARE - Administrator, Civil Service (Medical): No    Lack of Transportation (Non-Medical): No  Physical Activity: Inactive (03/11/2024)   Exercise Vital Sign    Days of Exercise per Week: 0 days    Minutes of Exercise per Session: 0 min  Stress: No Stress Concern Present (03/11/2024)   [employer] of Occupational Health - Occupational Stress Questionnaire    Feeling of Stress: Only a little   Social Connections: Socially Isolated (03/11/2024)   Social Connection and Isolation Panel    Frequency of Communication with Friends and Family: More than three times a week    Frequency of Social Gatherings with Friends and Family: Twice a week    Attends Religious Services: Never    Database administrator or Organizations: No    Attends Engineer, structural: Never    Marital Status: Never married   Family History  Problem Relation Age of Onset   Breast cancer Mother    Cancer Mother        small intestine   Liver cancer Mother    Diabetes Mother    Diabetes Father  Diabetes Brother    Hypertension Maternal Grandmother    Heart Problems Maternal Grandmother    Diabetes Paternal Grandmother    Diabetes Paternal Grandfather    Diabetes Brother     ASSESSMENT Recent Results: The most recent result is correlated with 60 mg  warfarin per week: Lab Results  Component Value Date   INR 2.0 05/25/2024   INR 3.5 (A) 04/27/2024   INR 2.4 04/02/2024    Anticoagulation Dosing: Description   Take two (2) of your 5 mg strength, peach-colored warfarin tablets on Mondays, Wednesdays and Fridays. All OTHER DAYS (Sundays, Tuesdays, Thursdays and Saturdays), take only one-and-one-half (1 & 1/2) tablets.      INR today: Therapeutic  PLAN Weekly dose was unchanged. Continue to take two (2) of your 5 mg peach-colored warfarin tablets by mouth, once-daily at 4PM on Mondays, Wednesdays and Fridays; ALL OTHER DAYS (Sundays, Tuesdays, Thursdays and Saturdays), take only one-and-one-half (1 & 1/2) tablets.   Patient Instructions  Patient instructed to take medications as defined in the Anti-coagulation Track section of this encounter.  Patient instructed to take today's dose.  Patient instructed to take two (2) of your 5 mg strength, peach-colored warfarin tablets on Mondays, Wednesdays and Fridays. All OTHER DAYS (Sundays, Tuesdays, Thursdays and Saturdays), take only  one-and-one-half (1 & 1/2) tablets.  Patient verbalized understanding of these instructions.  Patient advised to contact clinic or seek medical attention if signs/symptoms of bleeding or thromboembolism occur.  Patient verbalized understanding by repeating back information and was advised to contact me if further medication-related questions arise. Patient was also provided an information handout.  Follow-up Return in 4 weeks (on 06/22/2024) for Follow up INR.  Gregory May, PharmD, CPP Clinical Pharmacist Practitioner  15 minutes spent face-to-face with the patient during the encounter. 50% of time spent on education, including signs/sx bleeding and clotting, as well as food and drug interactions with warfarin. 50% of time was spent on fingerprick POC INR sample collection,processing, results determination, and documentation in [email or web address].

## 2024-05-26 NOTE — Progress Notes (Signed)
Evaluation and management procedures were performed by the Clinical Pharmacy Practitioner under my supervision and collaboration. I have reviewed the Practitioner's note and chart, and I agree with the management and plan as documented above. ° °

## 2024-05-27 ENCOUNTER — Other Ambulatory Visit: Payer: Self-pay | Admitting: Pharmacist

## 2024-05-29 ENCOUNTER — Other Ambulatory Visit: Payer: Self-pay | Admitting: Pharmacist

## 2024-06-04 ENCOUNTER — Encounter: Payer: Self-pay | Admitting: *Deleted

## 2024-06-22 ENCOUNTER — Ambulatory Visit: Admitting: Pharmacist

## 2024-06-22 DIAGNOSIS — H3410 Central retinal artery occlusion, unspecified eye: Secondary | ICD-10-CM

## 2024-06-22 DIAGNOSIS — H349 Unspecified retinal vascular occlusion: Secondary | ICD-10-CM

## 2024-06-22 DIAGNOSIS — Z7901 Long term (current) use of anticoagulants: Secondary | ICD-10-CM | POA: Diagnosis not present

## 2024-06-22 LAB — POCT INR: INR: 2 (ref 2.0–3.0)

## 2024-06-22 NOTE — Patient Instructions (Addendum)
 Patient instructed to take medications as defined in the Anti-coagulation Track section of this encounter.  Patient instructed to take today's dose.  Patient instructed to take two (2) of your 5 mg strength, peach-colored warfarin tablets on Monday through Friday of each week. On Saturdays and Sundays, take only one-and-one-half (1 & 1/2) of your 5 mg strength, peach-colored warfarin tablets on Saturdays and Sundays.  Patient verbalized understanding of these instructions.

## 2024-06-22 NOTE — Progress Notes (Signed)
Evaluation and management procedures were performed by the Clinical Pharmacy Practitioner under my supervision and collaboration. I have reviewed the Practitioner's note and chart, and I agree with the management and plan as documented above. ° °

## 2024-06-22 NOTE — Progress Notes (Signed)
 Anticoagulation Management Gregory May is a 63 y.o. male who reports to the clinic for monitoring of warfarin treatment.    Indication: Retinal artery occlusion, central  Right retinal embolus  Long term (current) use of anticoagulants; INR range 2.0 - 3.0.  Duration: indefinite Supervising physician: Mliss Foot, MD  Anticoagulation Clinic Visit History: Patient does not report signs/symptoms of bleeding or thromboembolism  Other recent changes: No diet, medications, lifestyle changes cited or identified at this visit.  Anticoagulation Episode Summary     Current INR goal:  2.0-3.0  TTR:  76.7% (2.2 y)  Next INR check:  07/20/2024  INR from last check:  2.0 (06/22/2024)  Weekly max warfarin dose:  --  Target end date:  --  INR check location:  --  Preferred lab:  --  Send INR reminders to:  --   Indications   Pulmonary emboli (HCC) (Resolved) [I26.99] DVT (deep venous thrombosis) (HCC) (Resolved) [I82.409] Retinal artery occlusion central [H34.10] Right retinal embolus [H34.9] Long term (current) use of anticoagulants [Z79.01]        Comments:  --         No Known Allergies  Current Outpatient Medications:    Accu-Chek FastClix Lancets MISC, Use Accu Chek Fastclix lancets to check blood sugar three times daily. DX:E11.65, Disp: 300 each, Rfl: 2   ACCU-CHEK GUIDE test strip, TEST BLOOD SUGAR THREE TIMES DAILY AS DIRECTED, Disp: 300 strip, Rfl: 3   acetaminophen  (TYLENOL ) 500 MG tablet, Take 1,000 mg by mouth as needed for mild pain or moderate pain., Disp: , Rfl:    fluticasone  (FLONASE ) 50 MCG/ACT nasal spray, USE 2 SPRAY(S) IN EACH NOSTRIL AT BEDTIME AS NEEDED FOR  SINUS  DRAINAGE, Disp: 16 g, Rfl: 0   gabapentin  (NEURONTIN ) 300 MG capsule, TAKE 1 CAPSULE BY MOUTH 4 TIMES  DAILY AS NEEDED, Disp: 400 capsule, Rfl: 2   insulin  aspart (NOVOLOG ) 100 UNIT/ML injection, Take 5 units with breakfast and 10 units with supper, okay to increase up to 15 units with  supper for hyperglycemia., Disp: 10 mL, Rfl: 4   insulin  degludec (TRESIBA  FLEXTOUCH) 100 UNIT/ML FlexTouch Pen, Inject 5 Units into the skin daily., Disp: 15 mL, Rfl: 4   Insulin  Syringes, Disposable, U-100 0.5 ML MISC, Use to inject insulin , Disp: 100 each, Rfl: 2   metFORMIN  (GLUCOPHAGE -XR) 750 MG 24 hr tablet, TAKE 2 TABLETS BY MOUTH DAILY, Disp: 200 tablet, Rfl: 2   Multiple Vitamin (MULTIVITAMIN WITH MINERALS) TABS tablet, Take 1 tablet by mouth every morning. Centrum, Disp: , Rfl:    olmesartan  (BENICAR ) 40 MG tablet, Take 1 tablet (40 mg total) by mouth daily., Disp: 90 tablet, Rfl: 3   pioglitazone  (ACTOS ) 30 MG tablet, TAKE 1 TABLET BY MOUTH DAILY, Disp: 100 tablet, Rfl: 2   prednisoLONE  acetate (PRED FORTE ) 1 % ophthalmic suspension, Place 1 drop into the right eye every evening., Disp: , Rfl:    rosuvastatin  (CRESTOR ) 20 MG tablet, TAKE 1 TABLET BY MOUTH DAILY, Disp: 100 tablet, Rfl: 2   warfarin (COUMADIN ) 5 MG tablet, Take two (2) of the 5 mg strength, peach-colored warfarin tablets on MONDAYS, WEDNESDAYS and FRIDAYS. 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 Date of diagnosis February 08 2014 Duration of anticoagulation: 6 months. End date 08/12/2014.  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 Duration of anticoagulation: 8 months. End date 10/11/2014.  Anticoagulant: Lovenox  120 units daily Switched to Eliquis  on 05/25/2014 per patient preference    Pulmonary embolism (HCC)    Sebaceous cyst    on back of neck   Social History   Socioeconomic History   Marital status: Single    Spouse name: Not on file   Number of children: 0   Years of education: Not on file   Highest education level: Not on file  Occupational History   Occupation: disabled  Tobacco Use   Smoking status: Never   Smokeless tobacco: Never  Vaping Use   Vaping status: Never Used  Substance and Sexual Activity   Alcohol use: Not Currently    Comment: beer and mixed drink maybe 5  times a month   Drug use: No   Sexual activity: Never  Other Topics Concern   Not on file  Social History Narrative   Not on file   Social Drivers of Health   Financial Resource Strain: Low Risk  (03/11/2024)   Overall Financial Resource Strain (CARDIA)    Difficulty of Paying Living Expenses: Not hard at all  Food Insecurity: No Food Insecurity (03/11/2024)   Hunger Vital Sign    Worried About Running Out of Food in the Last Year: Never true    Ran Out of Food in the Last Year: Never true  Transportation Needs: No Transportation Needs (03/11/2024)   PRAPARE - Administrator, Civil Service (Medical): No    Lack of Transportation (Non-Medical): No  Physical Activity: Inactive (03/11/2024)   Exercise Vital Sign    Days of Exercise per Week: 0 days    Minutes of Exercise per Session: 0 min  Stress: No Stress Concern Present (03/11/2024)   [employer] of Occupational Health - Occupational Stress Questionnaire    Feeling of Stress: Only a little  Social Connections: Socially Isolated (03/11/2024)   Social Connection and Isolation Panel     Frequency of Communication with Friends and Family: More than three times a week    Frequency of Social Gatherings with Friends and Family: Twice a week    Attends Religious Services: Never    Database Administrator or Organizations: No    Attends Engineer, Structural: Never    Marital Status: Never married   Family History  Problem Relation Age of Onset   Breast cancer Mother    Cancer Mother        small intestine   Liver cancer Mother    Diabetes Mother    Diabetes Father    Diabetes Brother    Hypertension Maternal Grandmother  Heart Problems Maternal Grandmother    Diabetes Paternal Grandmother    Diabetes Paternal Grandfather    Diabetes Brother     ASSESSMENT Recent Results: The most recent result is correlated with 60 mg  warfarin per week: Lab Results  Component Value Date   INR 2.0 06/22/2024   INR 2.0 05/25/2024   INR 3.5 (A) 04/27/2024    Anticoagulation Dosing: Description   Take two (2) of your 5 mg strength, peach-colored warfarin tablets on Monday through Friday of each week. On Saturdays and Sundays, take only one-and-one-half (1 & 1/2) of your 5 mg strength, peach-colored warfarin tablets on Saturdays and Sundays.      INR today: Therapeutic  PLAN Weekly dose was increased by 8% to 65 mg warfarin per week  Patient Instructions  Patient instructed to take medications as defined in the Anti-coagulation Track section of this encounter.  Patient instructed to take today's dose.  Patient instructed to take two (2) of your 5 mg strength, peach-colored warfarin tablets on Monday through Friday of each week. On Saturdays and Sundays, take only one-and-one-half (1 & 1/2) of your 5 mg strength, peach-colored warfarin tablets on Saturdays and Sundays.  Patient verbalized understanding of these instructions.  Patient advised to contact clinic or seek medical attention if signs/symptoms of bleeding or thromboembolism occur.  Patient verbalized  understanding by repeating back information and was advised to contact me if further medication-related questions arise. Patient was also provided an information handout.  Follow-up Return in 4 weeks (on 07/20/2024).  Gregory May, PharmD, CPP Clinical Pharmacist Practitioner  15 minutes spent face-to-face with the patient during the encounter. 50% of time spent on education, including signs/sx bleeding and clotting, as well as food and drug interactions with warfarin. 50% of time was spent on fingerprick POC INR sample collection,processing, results determination, and documentation in [email or web address].

## 2024-06-29 ENCOUNTER — Other Ambulatory Visit: Payer: Self-pay | Admitting: Pharmacist

## 2024-06-29 MED ORDER — WARFARIN SODIUM 5 MG PO TABS: ORAL_TABLET | ORAL | 3 refills | Status: DC

## 2024-07-20 ENCOUNTER — Ambulatory Visit: Admitting: Pharmacist

## 2024-07-20 DIAGNOSIS — Z7901 Long term (current) use of anticoagulants: Secondary | ICD-10-CM

## 2024-07-20 DIAGNOSIS — H3413 Central retinal artery occlusion, bilateral: Secondary | ICD-10-CM

## 2024-07-20 DIAGNOSIS — H3411 Central retinal artery occlusion, right eye: Secondary | ICD-10-CM

## 2024-07-20 DIAGNOSIS — H349 Unspecified retinal vascular occlusion: Secondary | ICD-10-CM

## 2024-07-20 LAB — POCT INR: INR: 2.3 (ref 2.0–3.0)

## 2024-07-20 NOTE — Progress Notes (Signed)
 Anticoagulation Management Gregory May is a 63 y.o. male who reports to the clinic for monitoring of warfarin treatment.    Indication: Retinal artery occlusion, central  Right retinal embolus  Long term (current) use of anticoagulants with warfarin. Target INR range 2.0 - 3.0.  Duration: indefinite Supervising physician: Mliss Foot, MD Anticoagulation Clinic Visit History: Patient does not report signs/symptoms of bleeding or thromboembolism  Other recent changes: No diet, medications, lifestyle changes endorsed or identified at this visit.  Anticoagulation Episode Summary     Current INR goal:  2.0-3.0  TTR:  77.5% (2.2 y)  Next INR check:  08/17/2024  INR from last check:  2.3 (07/20/2024)  Weekly max warfarin dose:  --  Target end date:  --  INR check location:  --  Preferred lab:  --  Send INR reminders to:  --   Indications   Pulmonary emboli (HCC) (Resolved) [I26.99] DVT (deep venous thrombosis) (HCC) (Resolved) [I82.409] Retinal artery occlusion central [H34.10] Right retinal embolus [H34.9] Long term (current) use of anticoagulants [Z79.01]        Comments:  --         No Known Allergies  Current Outpatient Medications:    Accu-Chek FastClix Lancets MISC, Use Accu Chek Fastclix lancets to check blood sugar three times daily. DX:E11.65, Disp: 300 each, Rfl: 2   ACCU-CHEK GUIDE test strip, TEST BLOOD SUGAR THREE TIMES DAILY AS DIRECTED, Disp: 300 strip, Rfl: 3   acetaminophen  (TYLENOL ) 500 MG tablet, Take 1,000 mg by mouth as needed for mild pain or moderate pain., Disp: , Rfl:    fluticasone  (FLONASE ) 50 MCG/ACT nasal spray, USE 2 SPRAY(S) IN EACH NOSTRIL AT BEDTIME AS NEEDED FOR  SINUS  DRAINAGE, Disp: 16 g, Rfl: 0   gabapentin  (NEURONTIN ) 300 MG capsule, TAKE 1 CAPSULE BY MOUTH 4 TIMES  DAILY AS NEEDED, Disp: 400 capsule, Rfl: 2   insulin  aspart (NOVOLOG ) 100 UNIT/ML injection, Take 5 units with breakfast and 10 units with supper, okay to increase up to  15 units with supper for hyperglycemia., Disp: 10 mL, Rfl: 4   insulin  degludec (TRESIBA  FLEXTOUCH) 100 UNIT/ML FlexTouch Pen, Inject 5 Units into the skin daily., Disp: 15 mL, Rfl: 4   Insulin  Syringes, Disposable, U-100 0.5 ML MISC, Use to inject insulin , Disp: 100 each, Rfl: 2   metFORMIN  (GLUCOPHAGE -XR) 750 MG 24 hr tablet, TAKE 2 TABLETS BY MOUTH DAILY, Disp: 200 tablet, Rfl: 2   Multiple Vitamin (MULTIVITAMIN WITH MINERALS) TABS tablet, Take 1 tablet by mouth every morning. Centrum, Disp: , Rfl:    olmesartan  (BENICAR ) 40 MG tablet, Take 1 tablet (40 mg total) by mouth daily., Disp: 90 tablet, Rfl: 3   pioglitazone  (ACTOS ) 30 MG tablet, TAKE 1 TABLET BY MOUTH DAILY, Disp: 100 tablet, Rfl: 2   prednisoLONE  acetate (PRED FORTE ) 1 % ophthalmic suspension, Place 1 drop into the right eye every evening., Disp: , Rfl:    rosuvastatin  (CRESTOR ) 20 MG tablet, TAKE 1 TABLET BY MOUTH DAILY, Disp: 100 tablet, Rfl: 2   warfarin (COUMADIN ) 5 MG tablet, Take two (2) of the 5 mg strength, peach-colored warfarin tablets, MONDAY THROUGH FRIDAY.All other days,  Saturdays and Sundays, take only one-and-one-half (1 and 1/2) tablets., Disp: 48 tablet, Rfl: 3   zinc  sulfate 220 (50 Zn) MG capsule, Take 1 capsule (220 mg total) by mouth daily., Disp: , Rfl:  Past Medical History:  Diagnosis Date   Arthritis    bilateral hips   Cutaneous abscess of  left foot    Deep vein thrombosis (DVT) (HCC)    Diabetes mellitus    type II   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 Date of diagnosis February 08 2014 Duration of anticoagulation: 6 months. End date 08/12/2014.  Anticoagulant: Lovenox  120 units daily Switched to Eliquis  on 05/25/2014      Ear drum perforation, right 03/06/2019   Non-pressure chronic ulcer of right calf, limited to breakdown of skin (HCC) 04/17/2017   Pulmonary emboli (HCC) 02/08/2014   Date  of diagnosis February 08 2014, on chest CTA Hospitalized for 3 days Had some symptoms of shortness of breath, and chest pain With intercurrent DVT of the left LE. Duration of anticoagulation: 8 months. End date 10/11/2014.  Anticoagulant: Lovenox  120 units daily Switched to Eliquis  on 05/25/2014 per patient preference    Pulmonary embolism (HCC)    Sebaceous cyst    on back of neck   Social History   Socioeconomic History   Marital status: Single    Spouse name: Not on file   Number of children: 0   Years of education: Not on file   Highest education level: Not on file  Occupational History   Occupation: disabled  Tobacco Use   Smoking status: Never   Smokeless tobacco: Never  Vaping Use   Vaping status: Never Used  Substance and Sexual Activity   Alcohol use: Not Currently    Comment: beer and mixed drink maybe 5  times a month   Drug use: No   Sexual activity: Never  Other Topics Concern   Not on file  Social History Narrative   Not on file   Social Drivers of Health   Financial Resource Strain: Low Risk  (03/11/2024)   Overall Financial Resource Strain (CARDIA)    Difficulty of Paying Living Expenses: Not hard at all  Food Insecurity: No Food Insecurity (03/11/2024)   Hunger Vital Sign    Worried About Running Out of Food in the Last Year: Never true    Ran Out of Food in the Last Year: Never true  Transportation Needs: No Transportation Needs (03/11/2024)   PRAPARE - Administrator, Civil Service (Medical): No    Lack of Transportation (Non-Medical): No  Physical Activity: Inactive (03/11/2024)   Exercise Vital Sign    Days of Exercise per Week: 0 days    Minutes of Exercise per Session: 0 min  Stress: No Stress Concern Present (03/11/2024)   Gregory May    Feeling of Stress: Only a little  Social Connections: Socially Isolated (03/11/2024)   Social Connection and Isolation Panel    Frequency of  Communication with Friends and Family: More than three times a week    Frequency of Social Gatherings with Friends and Family: Twice a week    Attends Religious Services: Never    Database Administrator or Organizations: No    Attends Engineer, Structural: Never    Marital Status: Never married   Family History  Problem Relation Age of Onset   Breast cancer Mother    Cancer Mother        small intestine   Liver cancer Mother    Diabetes Mother    Diabetes Father    Diabetes Brother    Hypertension Maternal Grandmother    Heart  Problems Maternal Grandmother    Diabetes Paternal Grandmother    Diabetes Paternal Grandfather    Diabetes Brother     ASSESSMENT Recent Results: The most recent result is correlated with 65 mg per week: Lab Results  Component Value Date   INR 2.3 07/20/2024   INR 2.0 06/22/2024   INR 2.0 05/25/2024    Anticoagulation Dosing: Description   Take two (2) of your 5 mg strength, peach-colored warfarin tablets on Monday through Saturday of each week. On Sundays, take only one-and-one-half (1 & 1/2) of your 5 mg strength, peach-colored warfarin tablets on Sundays.      INR today: Therapeutic  PLAN Weekly dose was increased by 4% to 67.5 mg per week  Patient Instructions  Patient instructed to take medications as defined in the Anti-coagulation Track section of this encounter.  Patient instructed to take today's dose.  Patient instructed to take  two (2) of your 5 mg strength, peach-colored warfarin tablets on Monday through Saturday of each week. On Sundays, take only one-and-one-half (1 & 1/2) of your 5 mg strength, peach-colored warfarin tablets on Sundays.  Patient verbalized understanding of these instructions.  Patient advised to contact clinic or seek medical attention if signs/symptoms of bleeding or thromboembolism occur.  Patient verbalized understanding by repeating back information and was advised to contact me if further  medication-related questions arise. Patient was also provided an information handout.  Follow-up Return in 4 weeks (on 08/17/2024) for Follow up INR.  Gregory May, PharmD, CPP Clinical Pharmacist Practitioner  15 minutes spent face-to-face with the patient during the encounter. 50% of time spent on education, including signs/sx bleeding and clotting, as well as food and drug interactions with warfarin. 50% of time was spent on fingerprick POC INR sample collection,processing, results determination, and documentation in [email or web address].

## 2024-07-20 NOTE — Patient Instructions (Signed)
 Patient instructed to take medications as defined in the Anti-coagulation Track section of this encounter.  Patient instructed to take today's dose.  Patient instructed to take  two (2) of your 5 mg strength, peach-colored warfarin tablets on Monday through Saturday of each week. On Sundays, take only one-and-one-half (1 & 1/2) of your 5 mg strength, peach-colored warfarin tablets on Sundays.  Patient verbalized understanding of these instructions.

## 2024-07-20 NOTE — Progress Notes (Signed)
Evaluation and management procedures were performed by the Clinical Pharmacy Practitioner under my supervision and collaboration. I have reviewed the Practitioner's note and chart, and I agree with the management and plan as documented above. ° °

## 2024-07-22 ENCOUNTER — Encounter: Payer: Self-pay | Admitting: Endocrinology

## 2024-07-22 ENCOUNTER — Ambulatory Visit: Payer: Self-pay | Admitting: Endocrinology

## 2024-07-22 ENCOUNTER — Ambulatory Visit: Admitting: Endocrinology

## 2024-07-22 VITALS — BP 138/82 | HR 77 | Resp 16 | Ht 72.0 in | Wt 227.0 lb

## 2024-07-22 DIAGNOSIS — Z794 Long term (current) use of insulin: Secondary | ICD-10-CM

## 2024-07-22 DIAGNOSIS — E1165 Type 2 diabetes mellitus with hyperglycemia: Secondary | ICD-10-CM

## 2024-07-22 LAB — POCT GLYCOSYLATED HEMOGLOBIN (HGB A1C): Hemoglobin A1C: 7.5 % — AB (ref 4.0–5.6)

## 2024-07-22 MED ORDER — TRESIBA FLEXTOUCH 100 UNIT/ML ~~LOC~~ SOPN: 7.0000 [IU] | PEN_INJECTOR | Freq: Every day | SUBCUTANEOUS | 4 refills | Status: AC

## 2024-07-22 NOTE — Patient Instructions (Signed)
 Tresiba  7 units daily in the morning.  Rest medications same.
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
## 2024-07-30 ENCOUNTER — Other Ambulatory Visit: Payer: Self-pay | Admitting: Student

## 2024-07-30 ENCOUNTER — Ambulatory Visit: Payer: Self-pay

## 2024-07-30 DIAGNOSIS — R051 Acute cough: Secondary | ICD-10-CM

## 2024-07-30 MED ORDER — OSELTAMIVIR PHOSPHATE 75 MG PO CAPS: 75.0000 mg | ORAL_CAPSULE | Freq: Two times a day (BID) | ORAL | 0 refills | Status: AC

## 2024-07-30 NOTE — Telephone Encounter (Signed)
 I talked to pt who basically repeated what is noted on E2C2's note. C/o cough, h/a, body aches; denied fever.He started taking Mucinex  last night and drinking plenty of water. Stated his uncle is sick and was prescribed Tamiflu  and now his wife is sick. Offered pt an appt; stated he does not have transportation; stated his aunt who recently died was his transportation and he's confined to a w/c. He wants to know if the doctor could call something in to the pharmacy and he can get someone to pick it up after they get off work later today. I did let pt know an appt might be needed but I will inform his doctor.

## 2024-07-30 NOTE — Telephone Encounter (Signed)
 FYI Only or Action Required?: Action required by provider: clinical question for provider and update on patient condition.  Patient was last seen in primary care on 04/02/2024 by Benuel Braun, DO.  Called Nurse Triage reporting Cough.  Symptoms began yesterday.  Interventions attempted: OTC medications: Mucinex .  Symptoms are: gradually worsening.  Triage Disposition: See Physician Within 24 Hours  Patient/caregiver understands and will follow disposition?: Yes       Copied from CRM [id number]. Topic: Clinical - Red Word Triage >> Jul 30, 2024 11:00 AM Gregory May wrote: Red Word that prompted transfer to Nurse Triage: PT stated 12/07 came in contact uncle has RSV/Flu. 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 ONSET: When did the cough begin?      Yesterday   2. SEVERITY: How bad is the cough today?      Deep intermittent   3. SPUTUM: Describe the color of your sputum (e.g., none, dry cough; clear, white, yellow, green)     White   4. HEMOPTYSIS: Are you coughing up any blood? If Yes, ask: How much? (e.g., flecks, streaks, tablespoons, etc.)     No   5. DIFFICULTY BREATHING: Are you having difficulty breathing? If Yes, ask: How bad is it? (e.g., mild, moderate, severe)    No      6. FEVER: Do you have a fever? If Yes, ask: What is your temperature, how was it measured, and when did it start?     No   7. CARDIAC HISTORY: Do you have any history of heart disease? (e.g., heart attack, congestive heart failure)      No   8. LUNG HISTORY: Do you have any history of lung disease?  (e.g., pulmonary embolus, asthma, emphysema)     No    10. OTHER SYMPTOMS: Do you have any other symptoms? (e.g., runny nose,  wheezing, chest pain)  No wheezing, raddle sound, runny nose    12. TRAVEL: Have you traveled out of the country in the last month? (e.g., travel history, exposures)       No     Patient called in to triage with complaints of Body aches/cough/Weakness/Chills. This has been ongoing for since yesterday. The patient stated he came in contact with sick relative with the flu.  For home care, the patient is taking OTC Mucinex .  Unable to schedule patient as she states he does not have transportation to the office- he also stated he can not do a virtual visit as he does not have a capable phone. He is hoping the provider can prescribe something for the symptoms, please advise.  Protocols used: Cough - Acute Productive-A-AH

## 2024-08-17 ENCOUNTER — Ambulatory Visit: Payer: Self-pay | Admitting: Pharmacist

## 2024-08-17 DIAGNOSIS — Z7901 Long term (current) use of anticoagulants: Secondary | ICD-10-CM | POA: Diagnosis not present

## 2024-08-17 DIAGNOSIS — H3411 Central retinal artery occlusion, right eye: Secondary | ICD-10-CM

## 2024-08-17 DIAGNOSIS — H349 Unspecified retinal vascular occlusion: Secondary | ICD-10-CM

## 2024-08-17 LAB — POCT INR: INR: 2.9 (ref 2.0–3.0)

## 2024-08-17 NOTE — Progress Notes (Signed)
 Anticoagulation Management Gregory May is a 64 y.o. male who reports to the clinic for monitoring of warfarin treatment.    Indication: Central artery retinal occlusion; Right retinal embolism; history of (resolved PE & DVT); Long term current use of oral anticoagulation with warfarin to maintain INR range 2.0  - 3.0.   Duration: indefinite Supervising physician: Elsie Penne Savannah, MD  Anticoagulation Clinic Visit History: Patient does not report signs/symptoms of bleeding or thromboembolism  Other recent changes: No diet, medications, lifestyle changes.  Anticoagulation Episode Summary     Current INR goal:  2.0-3.0  TTR:  78.2% (2.3 y)  Next INR check:  09/14/2024  INR from last check:  2.9 (08/17/2024)  Weekly max warfarin dose:  --  Target end date:  --  INR check location:  --  Preferred lab:  --  Send INR reminders to:  --   Indications   Pulmonary emboli (HCC) (Resolved) [I26.99] DVT (deep venous thrombosis) (HCC) (Resolved) [I82.409] Retinal artery occlusion central [H34.10] Right retinal embolus [H34.9] Long term (current) use of anticoagulants [Z79.01]        Comments:  --         Allergies[1] Current Medications[2] Past Medical History:  Diagnosis Date   Arthritis    bilateral hips   Cutaneous abscess of left foot    Deep vein thrombosis (DVT) (HCC)    Diabetes mellitus    type II   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 Date of diagnosis February 08 2014 Duration of anticoagulation: 6 months. End date 08/12/2014.  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 Duration of anticoagulation: 8 months. End date 10/11/2014.  Anticoagulant: Lovenox  120 units daily Switched to Eliquis  on 05/25/2014 per patient preference    Pulmonary embolism (HCC)    Sebaceous cyst    on back of neck   Social History   Socioeconomic History   Marital status: Single    Spouse name: Not on file   Number of children: 0   Years of education: Not on file   Highest education level: Not on file  Occupational History   Occupation: disabled  Tobacco Use   Smoking status: Never   Smokeless tobacco: Never  Vaping Use   Vaping status: Never Used  Substance and Sexual Activity   Alcohol use: Not Currently    Comment: beer and mixed drink maybe 5  times a month   Drug use: No   Sexual activity: Never  Other Topics Concern   Not on file  Social History Narrative   Not on file   Social Drivers of Health   Tobacco Use: Low Risk (07/22/2024)   Patient History    Smoking Tobacco Use: Never    Smokeless Tobacco Use: Never    Passive Exposure: Not on file  Financial Resource Strain: Low Risk (03/11/2024)   Overall Financial Resource Strain (CARDIA)    Difficulty of Paying Living Expenses: Not hard at all  Food Insecurity: No Food Insecurity (  03/11/2024)   Epic    Worried About Programme Researcher, Broadcasting/film/video in the Last Year: Never true    [employer] of Food in the Last Year: Never true  Transportation Needs: No Transportation Needs (03/11/2024)   Epic    Lack of Transportation (Medical): No    Lack of Transportation (Non-Medical): No  Physical Activity: Inactive (03/11/2024)   Exercise Vital Sign    Days of Exercise per Week: 0 days    Minutes of Exercise per Session: 0 min  Stress: No Stress Concern Present (03/11/2024)   [employer] of Occupational Health - Occupational Stress Questionnaire    Feeling of Stress: Only a little  Social Connections: Socially Isolated (03/11/2024)   Social  Connection and Isolation Panel    Frequency of Communication with Friends and Family: More than three times a week    Frequency of Social Gatherings with Friends and Family: Twice a week    Attends Religious Services: Never    Database Administrator or Organizations: No    Attends Banker Meetings: Never    Marital Status: Never married  Depression (PHQ2-9): Low Risk (03/11/2024)   Depression (PHQ2-9)    PHQ-2 Score: 0  Alcohol Screen: Low Risk (03/11/2024)   Alcohol Screen    Last Alcohol Screening Score (AUDIT): 0  Housing: Low Risk (03/11/2024)   Epic    Unable to Pay for Housing in the Last Year: No    Number of Times Moved in the Last Year: 0    Homeless in the Last Year: No  Utilities: Not At Risk (03/11/2024)   Epic    Threatened with loss of utilities: No  Health Literacy: Adequate Health Literacy (03/11/2024)   B1300 Health Literacy    Frequency of need for help with medical instructions: Never   Family History  Problem Relation Age of Onset   Breast cancer Mother    Cancer Mother        small intestine   Liver cancer Mother    Diabetes Mother    Diabetes Father    Diabetes Brother    Hypertension Maternal Grandmother    Heart Problems Maternal Grandmother    Diabetes Paternal Grandmother    Diabetes Paternal Grandfather    Diabetes Brother     ASSESSMENT Recent Results: The most recent result is correlated with 67.5 mg per week: Lab Results  Component Value Date   INR 2.9 08/17/2024   INR 2.3 07/20/2024   INR 2.0 06/22/2024    Anticoagulation Dosing: Description   Take one-and-one-half (1 & 1/2) of your 5 mg strength, peach-colored warfarin tablets on MONDAYS and THURSDAYS. All OTHER DAYS, take two (2) of your 5 mg strength, peach-colored warfarin tablets by mouth, once-daily.      INR today: Therapeutic  PLAN Weekly dose was decreased by 4% to 65 mg per week  Patient Instructions  Patient instructed to take medications as defined in the  Anti-coagulation Track section of this encounter.  Patient instructed to take today's dose.  Patient instructed to take one-and-one-half (1 & 1/2) of your 5 mg strength, peach-colored warfarin tablets by mouth, once daily on MONDAYS and THURSDAYS. All OTHER DAYS, take two  (2) tablets of your 5 mg strength, peach-colored warfarin tablets by mouth, once daily.  Patient verbalized understanding of these instructions.  Patient advised to contact clinic or seek medical attention if signs/symptoms of bleeding or thromboembolism occur.  Patient verbalized understanding by repeating back information and was advised to  contact me if further medication-related questions arise. Patient was also provided an information handout.  Follow-up Return in 4 weeks (on 09/14/2024) for Follow up INR.  Lynwood KATHEE Lites, PharmD, CPP Clinical Pharmacist Practitioner  15 minutes spent face-to-face with the patient during the encounter. 50% of time spent on education, including signs/sx bleeding and clotting, as well as food and drug interactions with warfarin. 50% of time was spent on fingerprick POC INR sample collection,processing, results determination, and documentation in [email or web address].    [1] No Known Allergies [2]  Current Outpatient Medications:    Accu-Chek FastClix Lancets MISC, Use Accu Chek Fastclix lancets to check blood sugar three times daily. DX:E11.65, Disp: 300 each, Rfl: 2   ACCU-CHEK GUIDE test strip, TEST BLOOD SUGAR THREE TIMES DAILY AS DIRECTED, Disp: 300 strip, Rfl: 3   acetaminophen  (TYLENOL ) 500 MG tablet, Take 1,000 mg by mouth as needed for mild pain or moderate pain., Disp: , Rfl:    fluticasone  (FLONASE ) 50 MCG/ACT nasal spray, USE 2 SPRAY(S) IN EACH NOSTRIL AT BEDTIME AS NEEDED FOR  SINUS  DRAINAGE, Disp: 16 g, Rfl: 0   gabapentin  (NEURONTIN ) 300 MG capsule, TAKE 1 CAPSULE BY MOUTH 4 TIMES  DAILY AS NEEDED, Disp: 400 capsule, Rfl: 2   insulin  aspart (NOVOLOG ) 100 UNIT/ML  injection, Take 5 units with breakfast and 10 units with supper, okay to increase up to 15 units with supper for hyperglycemia., Disp: 10 mL, Rfl: 4   insulin  degludec (TRESIBA  FLEXTOUCH) 100 UNIT/ML FlexTouch Pen, Inject 7 Units into the skin daily., Disp: 15 mL, Rfl: 4   Insulin  Syringes, Disposable, U-100 0.5 ML MISC, Use to inject insulin , Disp: 100 each, Rfl: 2   metFORMIN  (GLUCOPHAGE -XR) 750 MG 24 hr tablet, TAKE 2 TABLETS BY MOUTH DAILY, Disp: 200 tablet, Rfl: 2   Multiple Vitamin (MULTIVITAMIN WITH MINERALS) TABS tablet, Take 1 tablet by mouth every morning. Centrum, Disp: , Rfl:    olmesartan  (BENICAR ) 40 MG tablet, Take 1 tablet (40 mg total) by mouth daily., Disp: 90 tablet, Rfl: 3   pioglitazone  (ACTOS ) 30 MG tablet, TAKE 1 TABLET BY MOUTH DAILY, Disp: 100 tablet, Rfl: 2   prednisoLONE  acetate (PRED FORTE ) 1 % ophthalmic suspension, Place 1 drop into the right eye every evening., Disp: , Rfl:    rosuvastatin  (CRESTOR ) 20 MG tablet, TAKE 1 TABLET BY MOUTH DAILY, Disp: 100 tablet, Rfl: 2   warfarin (COUMADIN ) 5 MG tablet, Take two (2) of the 5 mg strength, peach-colored warfarin tablets, MONDAY THROUGH FRIDAY.All other days,  Saturdays and Sundays, take only one-and-one-half (1 and 1/2) tablets., Disp: 48 tablet, Rfl: 3   zinc  sulfate 220 (50 Zn) MG capsule, Take 1 capsule (220 mg total) by mouth daily., Disp: , Rfl:

## 2024-08-17 NOTE — Telephone Encounter (Signed)
 The patient form has been rec'd and placed in the PCP's box to sign an complete.  Copied from CRM #[id number]. Topic: General - Other >> Aug 10, 2024  1:45 PM Debby BROCKS wrote: Reason for CRM: Jaqueline from [employer] called to see if the information that was  faxed over paperwork on the 08/03/2024 has been received and completed.  Fax # [phone] Callback: [phone]

## 2024-08-17 NOTE — Patient Instructions (Signed)
 Patient instructed to take medications as defined in the Anti-coagulation Track section of this encounter.  Patient instructed to take today's dose.  Patient instructed to take one-and-one-half (1 & 1/2) of your 5 mg strength, peach-colored warfarin tablets by mouth, once daily on MONDAYS and THURSDAYS. All OTHER DAYS, take two  (2) tablets of your 5 mg strength, peach-colored warfarin tablets by mouth, once daily.  Patient verbalized understanding of these instructions.

## 2024-08-19 NOTE — Progress Notes (Signed)
Evaluation and management procedures were performed by the Clinical Pharmacy Practitioner under my supervision and collaboration. I have reviewed the Practitioner's note and chart, and I agree with the management and plan as documented above. ° °

## 2024-09-11 ENCOUNTER — Telehealth: Payer: Self-pay | Admitting: Pharmacist

## 2024-09-11 ENCOUNTER — Other Ambulatory Visit: Payer: Self-pay | Admitting: Pharmacist

## 2024-09-11 MED ORDER — WARFARIN SODIUM 5 MG PO TABS: ORAL_TABLET | ORAL | 3 refills | Status: AC

## 2024-09-11 NOTE — Telephone Encounter (Signed)
 Called patient and rescheduled his appointment to 9-FEB-26 at 1115h due to impending inclement weather anticipated 09/12/24 - 09/14/24.

## 2024-09-14 ENCOUNTER — Ambulatory Visit: Payer: Self-pay

## 2024-11-25 ENCOUNTER — Ambulatory Visit: Admitting: Endocrinology

## 2025-03-17 ENCOUNTER — Ambulatory Visit
